# Patient Record
Sex: Female | Born: 1955
Health system: Southern US, Community
[De-identification: ages and names within clinical notes are randomized; demographics above are authoritative.]

## PROBLEM LIST (undated history)

## (undated) DIAGNOSIS — Z720 Tobacco use: Secondary | ICD-10-CM

## (undated) DIAGNOSIS — E119 Type 2 diabetes mellitus without complications: Secondary | ICD-10-CM

## (undated) DIAGNOSIS — B1910 Unspecified viral hepatitis B without hepatic coma: Secondary | ICD-10-CM

## (undated) DIAGNOSIS — Z87442 Personal history of urinary calculi: Secondary | ICD-10-CM

## (undated) DIAGNOSIS — M1990 Unspecified osteoarthritis, unspecified site: Secondary | ICD-10-CM

## (undated) DIAGNOSIS — I739 Peripheral vascular disease, unspecified: Secondary | ICD-10-CM

## (undated) DIAGNOSIS — Z8673 Personal history of transient ischemic attack (TIA), and cerebral infarction without residual deficits: Secondary | ICD-10-CM

## (undated) DIAGNOSIS — F329 Major depressive disorder, single episode, unspecified: Secondary | ICD-10-CM

## (undated) DIAGNOSIS — F419 Anxiety disorder, unspecified: Secondary | ICD-10-CM

## (undated) DIAGNOSIS — R079 Chest pain, unspecified: Secondary | ICD-10-CM

## (undated) DIAGNOSIS — E785 Hyperlipidemia, unspecified: Secondary | ICD-10-CM

## (undated) DIAGNOSIS — N200 Calculus of kidney: Secondary | ICD-10-CM

## (undated) DIAGNOSIS — H409 Unspecified glaucoma: Secondary | ICD-10-CM

## (undated) DIAGNOSIS — I2510 Atherosclerotic heart disease of native coronary artery without angina pectoris: Secondary | ICD-10-CM

## (undated) DIAGNOSIS — I10 Essential (primary) hypertension: Secondary | ICD-10-CM

## (undated) DIAGNOSIS — I639 Cerebral infarction, unspecified: Secondary | ICD-10-CM

## (undated) DIAGNOSIS — Z9189 Other specified personal risk factors, not elsewhere classified: Secondary | ICD-10-CM

## (undated) DIAGNOSIS — H547 Unspecified visual loss: Secondary | ICD-10-CM

## (undated) DIAGNOSIS — F32A Depression, unspecified: Secondary | ICD-10-CM

## (undated) HISTORY — DX: Tobacco use: Z72.0

## (undated) HISTORY — PX: ABDOMINAL HYSTERECTOMY: SHX81

## (undated) HISTORY — DX: Cerebral infarction, unspecified: I63.9

## (undated) HISTORY — DX: Depression, unspecified: F32.A

## (undated) HISTORY — DX: Unspecified viral hepatitis B without hepatic coma: B19.10

## (undated) HISTORY — DX: Hyperlipidemia, unspecified: E78.5

## (undated) HISTORY — PX: OTHER SURGICAL HISTORY: SHX169

## (undated) HISTORY — DX: Major depressive disorder, single episode, unspecified: F32.9

## (undated) HISTORY — DX: Type 2 diabetes mellitus without complications: E11.9

## (undated) HISTORY — DX: Anxiety disorder, unspecified: F41.9

## (undated) HISTORY — DX: Unspecified visual loss: H54.7

## (undated) HISTORY — DX: Essential (primary) hypertension: I10

---

## 2000-06-23 ENCOUNTER — Emergency Department (HOSPITAL_COMMUNITY): Admission: EM | Admit: 2000-06-23 | Discharge: 2000-06-23 | Payer: Self-pay | Admitting: Emergency Medicine

## 2000-06-23 ENCOUNTER — Emergency Department (HOSPITAL_COMMUNITY): Admission: EM | Admit: 2000-06-23 | Discharge: 2000-06-24 | Payer: Self-pay | Admitting: Internal Medicine

## 2000-06-23 ENCOUNTER — Encounter: Payer: Self-pay | Admitting: Internal Medicine

## 2001-04-16 ENCOUNTER — Emergency Department (HOSPITAL_COMMUNITY): Admission: EM | Admit: 2001-04-16 | Discharge: 2001-04-16 | Payer: Self-pay | Admitting: Emergency Medicine

## 2003-03-03 ENCOUNTER — Encounter: Payer: Self-pay | Admitting: Emergency Medicine

## 2003-03-03 ENCOUNTER — Emergency Department (HOSPITAL_COMMUNITY): Admission: EM | Admit: 2003-03-03 | Discharge: 2003-03-03 | Payer: Self-pay | Admitting: Emergency Medicine

## 2003-03-29 ENCOUNTER — Inpatient Hospital Stay (HOSPITAL_COMMUNITY): Admission: AD | Admit: 2003-03-29 | Discharge: 2003-03-29 | Payer: Self-pay | Admitting: Family Medicine

## 2004-02-10 ENCOUNTER — Ambulatory Visit (HOSPITAL_COMMUNITY): Admission: RE | Admit: 2004-02-10 | Discharge: 2004-02-10 | Payer: Self-pay | Admitting: Internal Medicine

## 2004-03-19 ENCOUNTER — Emergency Department (HOSPITAL_COMMUNITY): Admission: EM | Admit: 2004-03-19 | Discharge: 2004-03-19 | Payer: Self-pay | Admitting: *Deleted

## 2004-07-19 ENCOUNTER — Emergency Department (HOSPITAL_COMMUNITY): Admission: EM | Admit: 2004-07-19 | Discharge: 2004-07-19 | Payer: Self-pay | Admitting: Emergency Medicine

## 2004-09-04 ENCOUNTER — Ambulatory Visit: Payer: Self-pay | Admitting: Internal Medicine

## 2004-09-04 ENCOUNTER — Ambulatory Visit: Payer: Self-pay | Admitting: *Deleted

## 2004-10-05 ENCOUNTER — Emergency Department (HOSPITAL_COMMUNITY): Admission: EM | Admit: 2004-10-05 | Discharge: 2004-10-05 | Payer: Self-pay | Admitting: Emergency Medicine

## 2004-10-13 ENCOUNTER — Ambulatory Visit: Payer: Self-pay | Admitting: Family Medicine

## 2004-11-04 ENCOUNTER — Ambulatory Visit: Payer: Self-pay | Admitting: Family Medicine

## 2004-11-10 ENCOUNTER — Ambulatory Visit: Payer: Self-pay | Admitting: Family Medicine

## 2004-11-12 ENCOUNTER — Ambulatory Visit: Payer: Self-pay | Admitting: Family Medicine

## 2005-01-12 ENCOUNTER — Ambulatory Visit: Payer: Self-pay | Admitting: Internal Medicine

## 2005-02-01 ENCOUNTER — Ambulatory Visit: Payer: Self-pay | Admitting: Family Medicine

## 2005-02-03 ENCOUNTER — Ambulatory Visit: Payer: Self-pay | Admitting: Family Medicine

## 2005-03-10 ENCOUNTER — Ambulatory Visit: Payer: Self-pay | Admitting: Internal Medicine

## 2005-04-15 ENCOUNTER — Ambulatory Visit: Payer: Self-pay | Admitting: Internal Medicine

## 2005-04-30 ENCOUNTER — Ambulatory Visit: Payer: Self-pay | Admitting: Family Medicine

## 2005-05-15 ENCOUNTER — Emergency Department (HOSPITAL_COMMUNITY): Admission: EM | Admit: 2005-05-15 | Discharge: 2005-05-15 | Payer: Self-pay | Admitting: Emergency Medicine

## 2005-05-21 ENCOUNTER — Ambulatory Visit: Payer: Self-pay | Admitting: Family Medicine

## 2005-05-25 ENCOUNTER — Ambulatory Visit: Payer: Self-pay | Admitting: Family Medicine

## 2005-05-31 ENCOUNTER — Ambulatory Visit: Payer: Self-pay | Admitting: Family Medicine

## 2005-06-08 ENCOUNTER — Ambulatory Visit: Payer: Self-pay | Admitting: Family Medicine

## 2005-06-25 ENCOUNTER — Ambulatory Visit: Payer: Self-pay | Admitting: Internal Medicine

## 2005-07-05 ENCOUNTER — Ambulatory Visit: Payer: Self-pay | Admitting: Family Medicine

## 2005-07-21 ENCOUNTER — Ambulatory Visit: Payer: Self-pay | Admitting: Family Medicine

## 2005-08-03 ENCOUNTER — Ambulatory Visit: Payer: Self-pay | Admitting: Internal Medicine

## 2005-08-16 ENCOUNTER — Ambulatory Visit: Payer: Self-pay | Admitting: Internal Medicine

## 2006-01-31 ENCOUNTER — Ambulatory Visit: Payer: Self-pay | Admitting: Family Medicine

## 2006-02-14 ENCOUNTER — Ambulatory Visit: Payer: Self-pay | Admitting: Family Medicine

## 2006-02-17 ENCOUNTER — Ambulatory Visit (HOSPITAL_COMMUNITY): Admission: RE | Admit: 2006-02-17 | Discharge: 2006-02-17 | Payer: Self-pay | Admitting: Family Medicine

## 2006-03-14 ENCOUNTER — Ambulatory Visit: Payer: Self-pay | Admitting: Family Medicine

## 2006-03-24 ENCOUNTER — Ambulatory Visit (HOSPITAL_COMMUNITY): Admission: RE | Admit: 2006-03-24 | Discharge: 2006-03-24 | Payer: Self-pay | Admitting: Family Medicine

## 2006-05-11 ENCOUNTER — Ambulatory Visit: Payer: Self-pay | Admitting: Family Medicine

## 2006-07-06 ENCOUNTER — Ambulatory Visit: Payer: Self-pay | Admitting: Family Medicine

## 2006-07-29 ENCOUNTER — Ambulatory Visit: Payer: Self-pay | Admitting: Family Medicine

## 2006-08-18 ENCOUNTER — Ambulatory Visit: Payer: Self-pay | Admitting: Family Medicine

## 2007-02-03 ENCOUNTER — Ambulatory Visit: Payer: Self-pay | Admitting: Family Medicine

## 2007-04-21 ENCOUNTER — Emergency Department (HOSPITAL_COMMUNITY): Admission: EM | Admit: 2007-04-21 | Discharge: 2007-04-21 | Payer: Self-pay | Admitting: Family Medicine

## 2007-04-26 ENCOUNTER — Ambulatory Visit: Payer: Self-pay | Admitting: Family Medicine

## 2007-06-12 DIAGNOSIS — IMO0002 Reserved for concepts with insufficient information to code with codable children: Secondary | ICD-10-CM | POA: Insufficient documentation

## 2007-06-12 DIAGNOSIS — I10 Essential (primary) hypertension: Secondary | ICD-10-CM | POA: Insufficient documentation

## 2007-06-12 DIAGNOSIS — E1165 Type 2 diabetes mellitus with hyperglycemia: Secondary | ICD-10-CM | POA: Insufficient documentation

## 2007-06-12 DIAGNOSIS — F411 Generalized anxiety disorder: Secondary | ICD-10-CM | POA: Insufficient documentation

## 2007-06-12 DIAGNOSIS — H5440 Blindness, one eye, unspecified eye: Secondary | ICD-10-CM | POA: Insufficient documentation

## 2007-06-12 DIAGNOSIS — F329 Major depressive disorder, single episode, unspecified: Secondary | ICD-10-CM

## 2007-06-12 DIAGNOSIS — E785 Hyperlipidemia, unspecified: Secondary | ICD-10-CM

## 2007-06-12 DIAGNOSIS — F17200 Nicotine dependence, unspecified, uncomplicated: Secondary | ICD-10-CM | POA: Insufficient documentation

## 2007-06-12 DIAGNOSIS — B1910 Unspecified viral hepatitis B without hepatic coma: Secondary | ICD-10-CM | POA: Insufficient documentation

## 2007-06-12 DIAGNOSIS — F32A Depression, unspecified: Secondary | ICD-10-CM | POA: Insufficient documentation

## 2007-06-12 DIAGNOSIS — E1169 Type 2 diabetes mellitus with other specified complication: Secondary | ICD-10-CM | POA: Insufficient documentation

## 2007-06-12 HISTORY — DX: Reserved for concepts with insufficient information to code with codable children: IMO0002

## 2007-06-12 HISTORY — DX: Type 2 diabetes mellitus with hyperglycemia: E11.65

## 2007-06-12 HISTORY — DX: Blindness, one eye, unspecified eye: H54.40

## 2007-06-28 ENCOUNTER — Emergency Department (HOSPITAL_COMMUNITY): Admission: EM | Admit: 2007-06-28 | Discharge: 2007-06-28 | Payer: Self-pay | Admitting: Family Medicine

## 2007-06-28 ENCOUNTER — Ambulatory Visit: Payer: Self-pay | Admitting: Internal Medicine

## 2007-09-13 ENCOUNTER — Encounter (INDEPENDENT_AMBULATORY_CARE_PROVIDER_SITE_OTHER): Payer: Self-pay | Admitting: *Deleted

## 2007-09-22 ENCOUNTER — Ambulatory Visit: Payer: Self-pay | Admitting: Internal Medicine

## 2007-10-06 ENCOUNTER — Ambulatory Visit: Payer: Self-pay | Admitting: Internal Medicine

## 2008-01-15 ENCOUNTER — Ambulatory Visit: Payer: Self-pay | Admitting: Internal Medicine

## 2008-01-15 LAB — CONVERTED CEMR LAB
ALT: 39 units/L — ABNORMAL HIGH (ref 0–35)
AST: 33 units/L (ref 0–37)
Albumin: 3.8 g/dL (ref 3.5–5.2)
Alkaline Phosphatase: 79 units/L (ref 39–117)
BUN: 11 mg/dL (ref 6–23)
CO2: 26 meq/L (ref 19–32)
Calcium: 9.6 mg/dL (ref 8.4–10.5)
Chloride: 106 meq/L (ref 96–112)
Cholesterol: 134 mg/dL (ref 0–200)
Creatinine, Ser: 0.86 mg/dL (ref 0.40–1.20)
Glucose, Bld: 81 mg/dL (ref 70–99)
HDL: 65 mg/dL (ref >39)
Hgb A1c MFr Bld: 6.8 % — ABNORMAL HIGH (ref 4.6–6.1)
LDL Cholesterol: 50 mg/dL (ref 0–99)
Potassium: 4.2 meq/L (ref 3.5–5.3)
Sodium: 143 meq/L (ref 135–145)
Total Bilirubin: 0.3 mg/dL (ref 0.3–1.2)
Total CHOL/HDL Ratio: 2.1
Total Protein: 6.3 g/dL (ref 6.0–8.3)
Triglycerides: 95 mg/dL (ref <150)
VLDL: 19 mg/dL (ref 0–40)

## 2008-01-25 ENCOUNTER — Ambulatory Visit (HOSPITAL_COMMUNITY): Admission: RE | Admit: 2008-01-25 | Discharge: 2008-01-25 | Payer: Self-pay | Admitting: Family Medicine

## 2008-02-22 ENCOUNTER — Ambulatory Visit: Payer: Self-pay | Admitting: Internal Medicine

## 2008-03-07 ENCOUNTER — Ambulatory Visit: Payer: Self-pay | Admitting: Internal Medicine

## 2008-03-25 ENCOUNTER — Ambulatory Visit: Payer: Self-pay | Admitting: Internal Medicine

## 2008-03-25 LAB — CONVERTED CEMR LAB
ALT: 27 units/L (ref 0–35)
AST: 21 units/L (ref 0–37)
Albumin: 4.3 g/dL (ref 3.5–5.2)
Alkaline Phosphatase: 85 units/L (ref 39–117)
Amphetamine Screen, Ur: NEGATIVE
BUN: 17 mg/dL (ref 6–23)
Barbiturate Quant, Ur: NEGATIVE
Basophils Absolute: 0 10*3/uL (ref 0.0–0.1)
Basophils Relative: 0 % (ref 0–1)
Benzodiazepines.: NEGATIVE
CO2: 23 meq/L (ref 19–32)
Calcium: 10.8 mg/dL — ABNORMAL HIGH (ref 8.4–10.5)
Chloride: 104 meq/L (ref 96–112)
Cocaine Metabolites: NEGATIVE
Creatinine, Ser: 1.04 mg/dL (ref 0.40–1.20)
Creatinine,U: 142.4 mg/dL
Eosinophils Absolute: 0.3 10*3/uL (ref 0.0–0.7)
Eosinophils Relative: 3 % (ref 0–5)
Glucose, Bld: 163 mg/dL — ABNORMAL HIGH (ref 70–99)
HCT: 41.6 % (ref 36.0–46.0)
HCV Ab: NEGATIVE
Hemoglobin: 13.3 g/dL (ref 12.0–15.0)
Hep A Total Ab: POSITIVE — AB
Hep B Core Total Ab: NEGATIVE
Hep B E Ab: NEGATIVE
Hep B S Ab: POSITIVE — AB
Lymphocytes Relative: 53 % — ABNORMAL HIGH (ref 12–46)
Lymphs Abs: 4.2 10*3/uL — ABNORMAL HIGH (ref 0.7–4.0)
MCHC: 32 g/dL (ref 30.0–36.0)
MCV: 72 fL — ABNORMAL LOW (ref 78.0–100.0)
Marijuana Metabolite: NEGATIVE
Methadone: NEGATIVE
Monocytes Absolute: 0.5 10*3/uL (ref 0.1–1.0)
Monocytes Relative: 6 % (ref 3–12)
Neutro Abs: 3 10*3/uL (ref 1.7–7.7)
Neutrophils Relative %: 37 % — ABNORMAL LOW (ref 43–77)
Opiate Screen, Urine: NEGATIVE
Phencyclidine (PCP): NEGATIVE
Platelets: 195 10*3/uL (ref 150–400)
Potassium: 4.1 meq/L (ref 3.5–5.3)
Propoxyphene: NEGATIVE
RBC: 5.78 M/uL — ABNORMAL HIGH (ref 3.87–5.11)
RDW: 14.6 % (ref 11.5–15.5)
Sodium: 141 meq/L (ref 135–145)
TSH: 0.582 microintl units/mL (ref 0.350–5.50)
Total Bilirubin: 0.5 mg/dL (ref 0.3–1.2)
Total Protein: 7.1 g/dL (ref 6.0–8.3)
WBC: 7.9 10*3/uL (ref 4.0–10.5)

## 2008-04-06 ENCOUNTER — Emergency Department (HOSPITAL_COMMUNITY): Admission: EM | Admit: 2008-04-06 | Discharge: 2008-04-06 | Payer: Self-pay | Admitting: Emergency Medicine

## 2008-04-24 ENCOUNTER — Ambulatory Visit: Payer: Self-pay | Admitting: Family Medicine

## 2008-05-06 ENCOUNTER — Ambulatory Visit: Payer: Self-pay | Admitting: Internal Medicine

## 2008-05-06 LAB — CONVERTED CEMR LAB
BUN: 10 mg/dL (ref 6–23)
Basophils Absolute: 0 10*3/uL (ref 0.0–0.1)
Basophils Relative: 0.5 % (ref 0.0–1.0)
CO2: 32 meq/L (ref 19–32)
Calcium: 10.5 mg/dL (ref 8.4–10.5)
Chloride: 107 meq/L (ref 96–112)
Creatinine, Ser: 0.8 mg/dL (ref 0.4–1.2)
Eosinophils Absolute: 0.1 10*3/uL (ref 0.0–0.7)
Eosinophils Relative: 1.8 % (ref 0.0–5.0)
GFR calc Af Amer: 96 mL/min
GFR calc non Af Amer: 80 mL/min
Glucose, Bld: 153 mg/dL — ABNORMAL HIGH (ref 70–99)
HCT: 39.6 % (ref 36.0–46.0)
Hemoglobin: 12.7 g/dL (ref 12.0–15.0)
Hgb A1c MFr Bld: 10.8 % — ABNORMAL HIGH (ref 4.6–6.0)
Lymphocytes Relative: 46.7 % — ABNORMAL HIGH (ref 12.0–46.0)
MCHC: 32 g/dL (ref 30.0–36.0)
MCV: 72.5 fL — ABNORMAL LOW (ref 78.0–100.0)
Monocytes Absolute: 0.6 10*3/uL (ref 0.1–1.0)
Monocytes Relative: 7.8 % (ref 3.0–12.0)
Neutro Abs: 3.2 10*3/uL (ref 1.4–7.7)
Neutrophils Relative %: 43.2 % (ref 43.0–77.0)
Platelets: 205 10*3/uL (ref 150–400)
Potassium: 4.2 meq/L (ref 3.5–5.1)
RBC: 5.46 M/uL — ABNORMAL HIGH (ref 3.87–5.11)
RDW: 13 % (ref 11.5–14.6)
Sodium: 141 meq/L (ref 135–145)
WBC: 7.3 10*3/uL (ref 4.5–10.5)

## 2008-05-09 ENCOUNTER — Ambulatory Visit: Payer: Self-pay | Admitting: Internal Medicine

## 2008-05-15 ENCOUNTER — Encounter: Payer: Self-pay | Admitting: Internal Medicine

## 2008-05-15 ENCOUNTER — Ambulatory Visit: Payer: Self-pay

## 2008-05-29 ENCOUNTER — Ambulatory Visit: Payer: Self-pay | Admitting: Internal Medicine

## 2008-05-31 ENCOUNTER — Ambulatory Visit: Payer: Self-pay | Admitting: Family Medicine

## 2008-06-03 ENCOUNTER — Ambulatory Visit: Payer: Self-pay | Admitting: Internal Medicine

## 2008-06-19 ENCOUNTER — Encounter (INDEPENDENT_AMBULATORY_CARE_PROVIDER_SITE_OTHER): Payer: Self-pay | Admitting: Family Medicine

## 2008-06-19 ENCOUNTER — Ambulatory Visit: Payer: Self-pay | Admitting: Internal Medicine

## 2008-06-19 LAB — CONVERTED CEMR LAB: Microalb, Ur: 0.96 mg/dL (ref 0.00–1.89)

## 2008-07-04 ENCOUNTER — Ambulatory Visit: Payer: Self-pay | Admitting: Internal Medicine

## 2008-07-15 ENCOUNTER — Ambulatory Visit: Payer: Self-pay | Admitting: Internal Medicine

## 2008-07-16 ENCOUNTER — Ambulatory Visit: Payer: Self-pay | Admitting: Internal Medicine

## 2008-07-27 ENCOUNTER — Emergency Department (HOSPITAL_COMMUNITY): Admission: EM | Admit: 2008-07-27 | Discharge: 2008-07-27 | Payer: Self-pay | Admitting: Emergency Medicine

## 2008-07-29 ENCOUNTER — Ambulatory Visit: Payer: Self-pay | Admitting: Internal Medicine

## 2008-08-14 ENCOUNTER — Ambulatory Visit: Payer: Self-pay | Admitting: Internal Medicine

## 2008-08-26 ENCOUNTER — Ambulatory Visit: Payer: Self-pay | Admitting: Internal Medicine

## 2008-10-07 ENCOUNTER — Ambulatory Visit: Payer: Self-pay | Admitting: Internal Medicine

## 2008-11-08 ENCOUNTER — Ambulatory Visit: Payer: Self-pay | Admitting: Family Medicine

## 2009-01-13 ENCOUNTER — Ambulatory Visit: Payer: Self-pay | Admitting: Internal Medicine

## 2009-01-15 ENCOUNTER — Ambulatory Visit: Payer: Self-pay | Admitting: Internal Medicine

## 2009-06-01 ENCOUNTER — Emergency Department (HOSPITAL_COMMUNITY): Admission: EM | Admit: 2009-06-01 | Discharge: 2009-06-01 | Payer: Self-pay | Admitting: Family Medicine

## 2009-08-19 ENCOUNTER — Ambulatory Visit: Payer: Self-pay | Admitting: *Deleted

## 2009-10-20 ENCOUNTER — Ambulatory Visit: Payer: Self-pay | Admitting: Internal Medicine

## 2009-11-13 ENCOUNTER — Ambulatory Visit: Payer: Self-pay | Admitting: Internal Medicine

## 2009-12-02 ENCOUNTER — Ambulatory Visit: Payer: Self-pay | Admitting: Internal Medicine

## 2009-12-04 ENCOUNTER — Ambulatory Visit: Payer: Self-pay | Admitting: Internal Medicine

## 2010-01-15 ENCOUNTER — Encounter (INDEPENDENT_AMBULATORY_CARE_PROVIDER_SITE_OTHER): Payer: Self-pay | Admitting: Adult Health

## 2010-01-15 ENCOUNTER — Ambulatory Visit: Payer: Self-pay | Admitting: Family Medicine

## 2010-01-15 LAB — CONVERTED CEMR LAB: Microalb, Ur: 1.52 mg/dL (ref 0.00–1.89)

## 2010-02-24 ENCOUNTER — Encounter (INDEPENDENT_AMBULATORY_CARE_PROVIDER_SITE_OTHER): Payer: Self-pay | Admitting: Adult Health

## 2010-02-24 ENCOUNTER — Ambulatory Visit: Payer: Self-pay | Admitting: Internal Medicine

## 2010-02-24 LAB — CONVERTED CEMR LAB
ALT: 108 units/L — ABNORMAL HIGH (ref 0–35)
AST: 126 units/L — ABNORMAL HIGH (ref 0–37)
Albumin: 4.3 g/dL (ref 3.5–5.2)
Alkaline Phosphatase: 103 units/L (ref 39–117)
BUN: 6 mg/dL (ref 6–23)
CO2: 26 meq/L (ref 19–32)
Calcium: 10.1 mg/dL (ref 8.4–10.5)
Chlamydia, Swab/Urine, PCR: NEGATIVE
Chloride: 104 meq/L (ref 96–112)
Cholesterol: 196 mg/dL (ref 0–200)
Creatinine, Ser: 0.7 mg/dL (ref 0.40–1.20)
GC Probe Amp, Urine: NEGATIVE
Glucose, Bld: 161 mg/dL — ABNORMAL HIGH (ref 70–99)
HDL: 58 mg/dL (ref >39)
LDL Cholesterol: 112 mg/dL — ABNORMAL HIGH (ref 0–99)
Pap Smear: NEGATIVE
Potassium: 3.9 meq/L (ref 3.5–5.3)
Sodium: 141 meq/L (ref 135–145)
Total Bilirubin: 0.6 mg/dL (ref 0.3–1.2)
Total CHOL/HDL Ratio: 3.4
Total Protein: 6.9 g/dL (ref 6.0–8.3)
Triglycerides: 131 mg/dL (ref <150)
VLDL: 26 mg/dL (ref 0–40)

## 2010-02-26 ENCOUNTER — Encounter (INDEPENDENT_AMBULATORY_CARE_PROVIDER_SITE_OTHER): Payer: Self-pay | Admitting: Adult Health

## 2010-02-26 LAB — CONVERTED CEMR LAB
HCV Ab: NEGATIVE
Hep A IgM: NEGATIVE
Hep B C IgM: NEGATIVE
Hepatitis B Surface Ag: NEGATIVE

## 2010-06-08 ENCOUNTER — Encounter (INDEPENDENT_AMBULATORY_CARE_PROVIDER_SITE_OTHER): Payer: Self-pay | Admitting: Adult Health

## 2010-06-08 ENCOUNTER — Ambulatory Visit: Payer: Self-pay | Admitting: Internal Medicine

## 2010-06-08 LAB — CONVERTED CEMR LAB
ALT: 32 units/L (ref 0–35)
AST: 29 units/L (ref 0–37)
Albumin: 4.1 g/dL (ref 3.5–5.2)
Alkaline Phosphatase: 73 units/L (ref 39–117)
Amphetamine Screen, Ur: NEGATIVE
BUN: 15 mg/dL (ref 6–23)
Barbiturate Quant, Ur: NEGATIVE
Benzodiazepines.: NEGATIVE
CO2: 25 meq/L (ref 19–32)
Calcium: 10 mg/dL (ref 8.4–10.5)
Chloride: 107 meq/L (ref 96–112)
Cholesterol: 184 mg/dL (ref 0–200)
Cocaine Metabolites: NEGATIVE
Creatinine, Ser: 0.81 mg/dL (ref 0.40–1.20)
Creatinine,U: 90.8 mg/dL
Glucose, Bld: 166 mg/dL — ABNORMAL HIGH (ref 70–99)
HDL: 73 mg/dL (ref >39)
LDL Cholesterol: 93 mg/dL (ref 0–99)
Marijuana Metabolite: NEGATIVE
Methadone: NEGATIVE
Opiate Screen, Urine: NEGATIVE
Phencyclidine (PCP): NEGATIVE
Potassium: 4.4 meq/L (ref 3.5–5.3)
Propoxyphene: NEGATIVE
Sodium: 142 meq/L (ref 135–145)
Total Bilirubin: 0.5 mg/dL (ref 0.3–1.2)
Total CHOL/HDL Ratio: 2.5
Total Protein: 6.7 g/dL (ref 6.0–8.3)
Triglycerides: 90 mg/dL (ref <150)
VLDL: 18 mg/dL (ref 0–40)
Vit D, 25-Hydroxy: 17 ng/mL — ABNORMAL LOW (ref 30–89)

## 2010-06-30 ENCOUNTER — Ambulatory Visit (HOSPITAL_COMMUNITY): Admission: RE | Admit: 2010-06-30 | Discharge: 2010-06-30 | Payer: Self-pay | Admitting: Internal Medicine

## 2010-07-30 ENCOUNTER — Ambulatory Visit: Payer: Self-pay | Admitting: Internal Medicine

## 2010-08-18 ENCOUNTER — Ambulatory Visit: Payer: Self-pay | Admitting: Internal Medicine

## 2010-08-18 LAB — CONVERTED CEMR LAB
ALT: 21 units/L (ref 0–35)
AST: 27 units/L (ref 0–37)
Albumin: 4.4 g/dL (ref 3.5–5.2)
Alkaline Phosphatase: 78 units/L (ref 39–117)
BUN: 18 mg/dL (ref 6–23)
CO2: 25 meq/L (ref 19–32)
Calcium: 10.4 mg/dL (ref 8.4–10.5)
Chloride: 103 meq/L (ref 96–112)
Cholesterol: 219 mg/dL — ABNORMAL HIGH (ref 0–200)
Creatinine, Ser: 0.93 mg/dL (ref 0.40–1.20)
Glucose, Bld: 124 mg/dL — ABNORMAL HIGH (ref 70–99)
HDL: 63 mg/dL (ref >39)
LDL Cholesterol: 134 mg/dL — ABNORMAL HIGH (ref 0–99)
Potassium: 4.2 meq/L (ref 3.5–5.3)
Sodium: 140 meq/L (ref 135–145)
Total Bilirubin: 0.4 mg/dL (ref 0.3–1.2)
Total CHOL/HDL Ratio: 3.5
Total Protein: 6.9 g/dL (ref 6.0–8.3)
Triglycerides: 108 mg/dL (ref <150)
VLDL: 22 mg/dL (ref 0–40)

## 2010-10-15 ENCOUNTER — Encounter
Admission: RE | Admit: 2010-10-15 | Discharge: 2010-11-30 | Payer: Self-pay | Source: Home / Self Care | Admitting: Internal Medicine

## 2010-11-06 ENCOUNTER — Ambulatory Visit (HOSPITAL_COMMUNITY): Admission: RE | Admit: 2010-11-06 | Discharge: 2010-11-06 | Payer: Self-pay | Admitting: Family Medicine

## 2011-01-17 ENCOUNTER — Encounter: Payer: Self-pay | Admitting: Internal Medicine

## 2011-04-12 ENCOUNTER — Emergency Department (HOSPITAL_COMMUNITY)
Admission: EM | Admit: 2011-04-12 | Discharge: 2011-04-12 | Disposition: A | Discharge status: Home / Self Care | Payer: Self-pay | Attending: Emergency Medicine | Admitting: Emergency Medicine

## 2011-04-12 DIAGNOSIS — R21 Rash and other nonspecific skin eruption: Secondary | ICD-10-CM | POA: Insufficient documentation

## 2011-04-12 DIAGNOSIS — E119 Type 2 diabetes mellitus without complications: Secondary | ICD-10-CM | POA: Insufficient documentation

## 2011-04-12 DIAGNOSIS — I10 Essential (primary) hypertension: Secondary | ICD-10-CM | POA: Insufficient documentation

## 2011-04-12 DIAGNOSIS — IMO0001 Reserved for inherently not codable concepts without codable children: Secondary | ICD-10-CM | POA: Insufficient documentation

## 2011-05-11 NOTE — Assessment & Plan Note (Signed)
[facility]                            CARDIOLOGY OFFICE NOTE   NAME:Holmes, Susan L                      MRN:          [id number]  DATE:05/06/2008                            DOB:          1956/08/03    IDENTIFICATION:  [name] is a 55 year old woman who was referred for  evaluation of chest pressure and shortness of breath.   HISTORY OF PRESENT ILLNESS:  The patient has no known history of  coronary artery disease.  She was seen in the emergency room recently  for the above complaints.  She says over the past month or so she has  had episodes of chest pressure that occur with and without activity.  No  alleviating factors.  They last about 5-10 minutes and then go away.  She does note more shortness of breath with exertion over the last month  or so.  Denies PND.  No reflux.   ALLERGIES:  None.   MEDICATIONS:  1. Vytorin 10/20.  2. Toprol-XL 100.  3. Neurontin 300.  4. Lantus as directed.  5. Glucotrol XL 10.  6. Dulcolax.  7. Avalide 300/12.5.  8. Aspirin 81.  9. Allegra.  10.Tramadol p.r.n.  11.Proventil p.r.n.  12.Hand cream p.r.n.   PAST MEDICAL HISTORY:  1. Diabetes diagnosed 5 years ago.  Glucose recently in the 500s; now      down to 200s.  2. Hypertension.  3. Dyslipidemia.   SOCIAL HISTORY:  The patient is currently without a job.  She smokes  about 6-7 cigarettes per day.  Does not drink.   FAMILY HISTORY:  Mother who died and had a heart attack and hypertension  at age 55.  Father died of a heart attack at age 53.  One sister who is  alive has had two aneurysms.  One brother deceased of an intracranial  aneurysm rupture.  Two brothers alive, one with a pacemaker and  hypertension.   REVIEW OF SYSTEMS:  Shortness of breath as noted.  The patient is having  increased blurriness of vision in both eyes.  This is over the past  couple of weeks.  Denies dysuria.  Otherwise all systems reviewed and  negative to the above  problem except as noted.   PHYSICAL EXAMINATION:  GENERAL:  The patient is in no distress.  Blood  pressure 129/86, pulse of 66 and regular, weight 197.  HEENT:  Normocephalic, atraumatic.  Extraocular movements intact.  Pupils are equal, round and reactive to light.  Mucous membranes are  moist.  Positive arcus senilis.  NECK:  JVP is normal.  No thyromegaly.  No bruits.  LUNGS:  Clear to auscultation without rales or wheezes.  CARDIAC:  Regular rate and rhythm, S1-S2.  No S3, no S4, no murmurs.  ABDOMEN:  Supple, nontender.  No masses.  Normal bowel sounds.  No  hepatomegaly.  EXTREMITIES:  Good distal pulses throughout.  No lower extremity edema.  Feet warm.   ELECTROCARDIOGRAM:  A 12-lead EKG done on April 06, 2008 shows normal  sinus bradycardia at 53 beats per minute.   IMPRESSION:  1.  Susan Holmes is a woman who was referred for evaluation of chest      pressure and shortness of breath.  She has multiple risk factors      for coronary disease.  I would recommend getting a stress Myoview      and also an echocardiogram to evaluate.  I will be in touch with      the patient regarding test results.  2. Dyslipidemia.  Continue on statin.  Note lipid panel done at      [facility] in January showed excellent control with an LDL of 50,      HDL of 65.  3. Diabetes.  As noted above, continue follow up at [facility].  4. Hypertension, adequate control.  5. Vision changes may be related to increased blood sugars.  She      denies any urinary tract symptoms.  I told her recontact her      ophthalmologist.   Follow up will be based on the patient's test results.     Pricilla Riffle, MD, [facility]  Electronically Signed    PVR/MedQ  DD: 05/06/2008  DT: 05/06/2008  Job #: [id number]

## 2011-05-11 NOTE — Consult Note (Signed)
NAME:  Dulay, Daley               ACCOUNT NO.:  [id number]   MEDICAL RECORD NO.:  [id number]          PATIENT TYPE:  EMS   LOCATION:  [facility]                         FACILITY:  [facility]   PHYSICIAN:  Zenaida Deed. Mayford Knife, M.D.DATE OF BIRTH:  [birth year]   DATE OF CONSULTATION:  04/06/2008  DATE OF DISCHARGE:  04/06/2008                                 CONSULTATION   PRIMARY CARE PHYSICIAN:  [facility] in [place].   TIME SEEN:  7:00 p.m. in the emergency department.   CHIEF COMPLAINT:  Fatigue and lightheadedness.   HISTORY OF PRESENT ILLNESS:  Ms. Fischetti is a very pleasant 55 year old  female who presents to the emergency department today because she is  tired of being tired.  She has had trouble with fatigue for the past 3  months approximately.  During this time, she has also noticed that her  legs get tired, ache, and feel like they are going to give out.  This  occurs with walking distances, and she describes the distance to me  walking from the emergency room that she is in currently to the parking  lot causing this.  She does not associate the dizziness with any  particular activity, and describes her dizziness as if she might pass  out.  She has not passed out at all.  She has tried vitamins to help  with these symptoms, but without relief.  She cannot think of anything,  but exertion that seems to make her fatigue worse.   PAST MEDICAL HISTORY:  1. Type 2 diabetes with probable complications.  2. Hypertension.  3. Hyperlipidemia.  4. Depression.  5. Insomnia.  6. Tobacco abuse.  7. Seasonal allergies.  8. Status post hysterectomy reportedly after an infection, after a      trial of getting her bilateral tubal ligation reversed.   SOCIAL HISTORY:  The patient currently lives alone and has a border  collie mix dog.  She has a 17 year old daughter who lives in that area.  She is not currently working and lost her job approximately 2 months  ago.  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  She denies  alcohol or drug abuse.  She does smoke approximately half a pack of  cigarettes per day.   FAMILY HISTORY:  She has multiple family members with hypertension and  diabetes.  She has a brother with a pacemaker.  She has a sister who had  a stroke at age 36 or 55, and she does not know why.  She states that  her father may have had a heart attack, but she is unsure.   MEDICATIONS:  1. Lantus 40 units subcu nightly.  2. Trazodone 200 to 300 mg p.o. nightly.  3. Lexapro 40 mg p.o. nightly.  4. Irbesartan/HCTZ 300/25 mg p.o. daily.  5. Neurontin 300 mg p.o. t.i.d.  6. Docusate 100 mg p.o. daily.  7. Clonidine 0.2 mg p.o. b.i.d.  8. Albuterol 2 puffs q.6 h. p.r.n.  9. Toprol-XL 100 mg p.o. daily.  10.Aspirin 81 mg p.o. daily.  11.Glucotrol XL 10 mg p.o. daily.  12.Allegra 180 mg p.o. daily.  13.Amitriptyline 25 mg p.o. daily.  14.Vytorin 10/20 p.o. daily.  15.Cyclobenzaprine 10 mg q.8 h. p.r.n.   ALLERGIES:  No known drug allergies.   REVIEW OF SYSTEMS:  Positive for dry eyes, blurry vision, occasional  headache, occasional lower back pain, occasional chest pain with  exertion that lasts a couple of minutes, has no radiation, feels heavy  in character and rarely occurs.  Positive for shortness of breath,  positive for fatigue, positive for 2-pillow orthopnea, positive for  episodes of feeling hot and then feeling cold, positive for knee aches,  positive for urinary frequency, positive for an itchy cyclical rash on  her hands, positive for depressed mood and for poor sleep.  Otherwise,  is negative on greater than 10 systems.   PHYSICAL EXAMINATION:  VITAL SIGNS:  Temperature 97.2 degrees  Fahrenheit, blood pressure 107/71, pulse 58, respirations 16, O2 sat 98%  on room air.  GENERAL:  This is a pleasant African American female in no apparent  distress.  She is sitting in the bed while talking to me.  HEENT:  Normocephalic and  atraumatic.  Pupils are equal, round, and  reactive to light and extraocular movements are intact.  External ears  are without deformities.  Nares are clear without discharge.  Oropharynx  is pink and moist.  There is fair dentition and there has been lots of  dental work.  CARDIOVASCULAR:  Reveals regular rate and rhythm and 1/6 systolic  ejection murmur at the right upper sternal border.  There is no JVD or  no carotid bruits appreciated.  PULMONARY:  Clear to auscultation bilaterally with normal work of  breathing.  ABDOMEN:  Nontender, nondistended, and soft with positive bowel sounds.  There is no hepatosplenomegaly appreciated.  EXTREMITIES: Reveal no cyanosis, clubbing, or edema.  Pulses are 2+  distally in all extremities.  There are no cords palpated in the calves.  SKIN:  Rash on the palms of her hands bilaterally and occasionally on  fingertips, which has pinpoint macular dark lesions, which have a  peeling skin above them secondary to the patient scratching.  NEURO:  She is alert and oriented x3.  Cranial nerves II-XII are grossly  intact.  Her gait is normal, and she is able to do toe walk and heel  walk without difficulty.  PSYCH:  Reveals a flat affect and depressed mood.  MUSCULOSKELETAL:  Reveals a negative straight leg raise and strength 5/5  in all extremities.   LABORATORY VALUES:  I-STAT reveals a sodium 135, potassium 4.2, chloride  101, bicarb 27, BUN 15, creatinine 1.2, glucose 176, iodized calcium  1.28, hemoglobin 15, and hematocrit 44.  Point-of-care cardiac enzymes  reveal myoglobin of 44.1, CK-MB less than 1 and troponin I less than  0.05.  CBC reveals white blood cell count 9.7, hemoglobin 12.7,  hematocrit 39.5, platelet count 205 with a normal differential.  Chest x-  ray revealed stable mild cardiomegaly without failure.  There is no  acute cardiopulmonary disease.  TSH is 1.181.   ASSESSMENT AND PLAN:  Ms. Creekmore is a very pleasant 55 year old  female  with fatigue, dizziness, diabetes, hypertension, depression,  hyperlipidemia, tobacco abuse, allergic rhinitis, insomnia, and  dyshidrosis.  1. Fatigue.  Likely, this is multifactorial, related to insomnia,      polypharmacy, hypertension, and depression.  We will check her TSH      as noted above, and if elevated, we would recommend treatment.  We  will decrease her clonidine to 0.1 mg p.o. b.i.d. till April 10, 2008, and then stop this medication as she may be having some      orthostasis secondary to this.  We will hold her Avalide until then      and restart this once she is off her clonidine.  We have arranged      for outpatient followup with cardiology to evaluate her heart.  She      will be seen with our cardiology on [address], [address] with      Dr. Tenny Craw on May 06, 2008, at 12 p.m., and we have arranged for an      appointment with [facility] in [place] on Wednesday, April 24, 2008, at 11:45 a.m.  We have recommended this given her cardiac      risk factors and complaints of occasional chest pain.  We have      recommended that she has an outpatient stress test.  2. We have made no changes to her other medical problems except as      noted in the paragraph above.  3. Dyshidrosis.  We would recommend that the patient be prescribed a      high-dose steroid cream to help with the itchiness that she      experienced on her palms in a cyclical pattern.  4. This dictation was discussed with my attending, Dr. Charissa Bash,      who agreed with this patient being discharged home from the      emergency department with close followup.  Followup has been      arranged as noted above.      Ancil Boozer, MD  Electronically Signed      Zenaida Deed. Mayford Knife, M.D.  Electronically Signed    SA/MEDQ  D:  04/08/2008  T:  04/09/2008  Job:  [id number]   cc:   Pricilla Riffle, MD, [facility]  [facility]

## 2011-05-13 ENCOUNTER — Emergency Department (HOSPITAL_COMMUNITY)
Admission: EM | Admit: 2011-05-13 | Discharge: 2011-05-13 | Disposition: A | Discharge status: Home / Self Care | Payer: Self-pay | Attending: Emergency Medicine | Admitting: Emergency Medicine

## 2011-05-13 ENCOUNTER — Emergency Department (HOSPITAL_COMMUNITY): Payer: Self-pay

## 2011-05-13 DIAGNOSIS — E119 Type 2 diabetes mellitus without complications: Secondary | ICD-10-CM | POA: Insufficient documentation

## 2011-05-13 DIAGNOSIS — I10 Essential (primary) hypertension: Secondary | ICD-10-CM | POA: Insufficient documentation

## 2011-05-13 DIAGNOSIS — M542 Cervicalgia: Secondary | ICD-10-CM | POA: Insufficient documentation

## 2011-05-13 DIAGNOSIS — Z794 Long term (current) use of insulin: Secondary | ICD-10-CM | POA: Insufficient documentation

## 2011-05-13 DIAGNOSIS — Z79899 Other long term (current) drug therapy: Secondary | ICD-10-CM | POA: Insufficient documentation

## 2011-05-13 DIAGNOSIS — J029 Acute pharyngitis, unspecified: Secondary | ICD-10-CM | POA: Insufficient documentation

## 2011-05-13 LAB — POCT I-STAT, CHEM 8
BUN: 8 mg/dL (ref 6–23)
Calcium, Ion: 1.33 mmol/L — ABNORMAL HIGH (ref 1.12–1.32)
Chloride: 101 mEq/L (ref 96–112)
Creatinine, Ser: 0.8 mg/dL (ref 0.4–1.2)
Glucose, Bld: 392 mg/dL — ABNORMAL HIGH (ref 70–99)
HCT: 41 % (ref 36.0–46.0)
Hemoglobin: 13.9 g/dL (ref 12.0–15.0)
Potassium: 3.9 mEq/L (ref 3.5–5.1)
Sodium: 135 mEq/L (ref 135–145)
TCO2: 24 mmol/L (ref 0–100)

## 2011-05-13 LAB — GLUCOSE, CAPILLARY
Glucose-Capillary: 423 mg/dL — ABNORMAL HIGH (ref 70–99)
Glucose-Capillary: 276 mg/dL — ABNORMAL HIGH (ref 70–99)

## 2011-05-13 LAB — RAPID STREP SCREEN (MED CTR MEBANE ONLY): Streptococcus, Group A Screen (Direct): NEGATIVE

## 2011-05-13 MED ORDER — IOHEXOL 300 MG/ML  SOLN
100.0000 mL | Freq: Once | INTRAMUSCULAR | Status: AC | PRN
  Administered 2011-05-13: 100 mL via INTRAVENOUS

## 2011-06-03 ENCOUNTER — Ambulatory Visit
Admission: RE | Admit: 2011-06-03 | Discharge: 2011-06-03 | Disposition: A | Discharge status: Home / Self Care | Payer: Medicaid Other | Source: Ambulatory Visit | Attending: Family Medicine | Admitting: Family Medicine

## 2011-06-03 ENCOUNTER — Other Ambulatory Visit: Payer: Self-pay | Admitting: Family Medicine

## 2011-06-03 DIAGNOSIS — I10 Essential (primary) hypertension: Secondary | ICD-10-CM

## 2011-06-28 ENCOUNTER — Encounter: Payer: Medicaid Other | Admitting: Neurosurgery

## 2011-06-28 ENCOUNTER — Ambulatory Visit: Payer: Self-pay | Admitting: Physical Medicine and Rehabilitation

## 2011-07-27 ENCOUNTER — Other Ambulatory Visit: Payer: Self-pay | Admitting: Family Medicine

## 2011-07-27 DIAGNOSIS — Z1231 Encounter for screening mammogram for malignant neoplasm of breast: Secondary | ICD-10-CM

## 2011-07-29 ENCOUNTER — Encounter: Payer: Self-pay | Admitting: Gastroenterology

## 2011-08-03 ENCOUNTER — Ambulatory Visit
Admission: RE | Admit: 2011-08-03 | Discharge: 2011-08-03 | Disposition: A | Discharge status: Home / Self Care | Payer: Medicaid Other | Source: Ambulatory Visit | Attending: Family Medicine | Admitting: Family Medicine

## 2011-08-03 ENCOUNTER — Encounter: Payer: Self-pay | Admitting: Internal Medicine

## 2011-08-03 ENCOUNTER — Ambulatory Visit (INDEPENDENT_AMBULATORY_CARE_PROVIDER_SITE_OTHER): Payer: Medicaid Other | Admitting: Internal Medicine

## 2011-08-03 VITALS — BP 136/86 | HR 86 | Temp 99.2°F | Ht 67.0 in | Wt 191.4 lb

## 2011-08-03 DIAGNOSIS — F411 Generalized anxiety disorder: Secondary | ICD-10-CM

## 2011-08-03 DIAGNOSIS — Z720 Tobacco use: Secondary | ICD-10-CM

## 2011-08-03 DIAGNOSIS — F329 Major depressive disorder, single episode, unspecified: Secondary | ICD-10-CM

## 2011-08-03 DIAGNOSIS — H5440 Blindness, one eye, unspecified eye: Secondary | ICD-10-CM

## 2011-08-03 DIAGNOSIS — E785 Hyperlipidemia, unspecified: Secondary | ICD-10-CM

## 2011-08-03 DIAGNOSIS — F17200 Nicotine dependence, unspecified, uncomplicated: Secondary | ICD-10-CM

## 2011-08-03 DIAGNOSIS — E119 Type 2 diabetes mellitus without complications: Secondary | ICD-10-CM

## 2011-08-03 DIAGNOSIS — I10 Essential (primary) hypertension: Secondary | ICD-10-CM

## 2011-08-03 DIAGNOSIS — Z1231 Encounter for screening mammogram for malignant neoplasm of breast: Secondary | ICD-10-CM

## 2011-08-03 LAB — POCT GLYCOSYLATED HEMOGLOBIN (HGB A1C): Hemoglobin A1C: 8.4

## 2011-08-03 LAB — GLUCOSE, CAPILLARY: Glucose-Capillary: 227 mg/dL — ABNORMAL HIGH (ref 70–99)

## 2011-08-03 MED ORDER — METFORMIN HCL 500 MG PO TABS: 500.0000 mg | ORAL_TABLET | Freq: Two times a day (BID) | ORAL | Status: DC

## 2011-08-03 MED ORDER — HYDROCHLOROTHIAZIDE 12.5 MG PO TABS: 12.5000 mg | ORAL_TABLET | Freq: Every day | ORAL | Status: DC

## 2011-08-03 NOTE — Assessment & Plan Note (Addendum)
This is a new patient to clinic, and she does come in today with a hemoglobin A1c of 8.4. She is currently taking NovoLog 6 units before breakfast and lunch, 8 units before dinnertime. She takes 40 units of Lantus at nighttime. Per the patient's glucometer typical blood sugars are in the 150s to 200 range, however her hemoglobin A1c would suggest that they may be higher at times. Today we will start metformin 500 mg twice a day to see if this gives her better control. When she changes her Medicaid provider to our clinic we will have her see Jamison Neighbor to discuss diabetes management. We will see her back in 6 weeks to reevaluate her level of control. Her last urine microalbumin screen was negative, and that was in January of 2011. We have asked her to come in and do a micro-albumin screen. We have also asked her to have a lipid screen as her last lipid panel was almost one year ago. We do a foot exam when we see her in 6 weeks. She has recently seen an eye doctor for vision, but has not seen an eye doctor related to her diabetic eye exam. We will consider referring her once her provider is changed. She has had a pneumonia vaccine in the past. She is not indicated for another shot until she turns 64. She is not on an ACE at this time, and has had problems with cough related to ACE in the past. Due to her insurance situation she is unable to have access to an ARB at this time. Her goal blood pressure is a systolic less than 130.

## 2011-08-03 NOTE — Assessment & Plan Note (Signed)
Patient is currently taking Zoloft as well as trazodone. She does describe her depression symptoms as more related to decrease in activity, rather than feeling down. She does deny that she feels down in the dumps and does deny any current suicidal ideation or homicidal ideation. She feels that she has been stable in this state of mental health for some time now. She states that she has been trialed on multiple depression medications in the past. She is not able to name all of their names, but thinks she has been on Prozac in the past. She does have a hard time falling asleep at night, which is why she is on the trazodone. Once she falls asleep however she is able to stay asleep the entire night. We will continue this therapy for now and reevaluate at next visit whether current medication regimen is appropriate. Quite possibly usage of gabapentin is contributing to her state of decreased energy level during the day.

## 2011-08-03 NOTE — Assessment & Plan Note (Signed)
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 She was recently seen by an eye doctor to assess her vision, and her prescription dosage was changed for her eye glasses.

## 2011-08-03 NOTE — Progress Notes (Signed)
Subjective:    Patient ID: Susan Holmes, female    DOB: 1956-11-24, 55 y.o.   MRN: [id number]  HPI: This is a 55 year old woman who is a new patient to our clinic. She does have a past medical history of diabetes mellitus type 2, hyperlipidemia, congenital blindness in left eye, anxiety, depression, tobacco abuse, hypertension. She comes to [place] on the following medications: NovoLog 6 units subcutaneous prior to breakfast and lunch, NovoLog 8 units subcutaneous prior to dinner, Lantus 40 units subcutaneously at bedtime, pravastatin 20 mg, gabapentin 300 mg twice a day, sertraline 150 mg daily, aspirin 81 mg daily, Toprol-XL 50 mg once daily, trazodone 200 mg each bedtime. She did bring her glucometer in for downloading today. She is not having any acute issues today, however wishes to switch her primary care practitioner to our clinic. She does state that her blood glucoses at home are usually between 150 and 200, but lately have been in the 200s at times. She does state that she has been on oral pills in the past for her diabetes. She does think that her depression and anxiety do impact her life, but feel that they have been stable for some time. We will reassess at future visit whether a change in therapy may be indicated. 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 She did see an eye doctor in the last several months who did change her prescription. She is a half a pack a day smoker. I did ask her if she is interested in quitting at this time, and she did seem interested.she does have an appointment with a GI doctor to get a colonoscopy in September. She did have a Pap screen in 2011 that was negative. She did have a mammogram done this month that was negative. She did have a pneumonia vaccine in the past. She did get a hemoglobin A1c drawn today. She has her last lipid panel approximately one year ago. She had her last microalbumin screen in 2011.  PMH: DM  II Hyperlipidemia HTN Anxiety Depression Congenital blindness L eye Tobacco Abuse (<.5PPD smoker)  Family Medical History Mother (died at 40 of MI and had HTN) Father (died at 71 of MI) Brother (died of intracranial aneurysm rupture) Sister (hx of 2 aneuruysms)  Past Surgical History: Hysterectomy  Social History: see chart, she does live independently and work as a Runner, broadcasting/film/video. She does have medicaid. She is a .5 PPD smoker, and does drinks alcohol socially but rarely, she does deny any illicit substances.   Review of Systems  Constitutional: Negative for fever, chills, diaphoresis, activity change, appetite change and unexpected weight change.  HENT: Negative.   Eyes: Negative.        Pt does have congenital blindness in the L eye   Respiratory: Negative.  Negative for cough and shortness of breath.   Cardiovascular: Negative.  Negative for palpitations.  Gastrointestinal: Negative.  Negative for nausea, vomiting, abdominal pain, diarrhea, constipation, blood in stool and abdominal distention.  Genitourinary: Negative for dysuria, flank pain and difficulty urinating.  Musculoskeletal: Negative.   Skin: Negative.   Neurological: Negative.   Hematological: Negative.   Psychiatric/Behavioral: Positive for sleep disturbance. Negative for suicidal ideas, hallucinations, behavioral problems, self-injury, dysphoric mood, decreased concentration and agitation. The patient is hyperactive.        Pt has a hard time falling asleep and does have some anxiety present all the time.        Objective:  Physical Exam  Constitutional: She is oriented to person, place, and time. She appears well-developed and well-nourished. No distress.  HENT:  Head: Normocephalic and atraumatic.  Eyes: EOM are normal. Pupils are equal, round, and reactive to light. Right eye exhibits no discharge. Left eye exhibits no discharge.  Neck: Normal range of motion. Neck supple. No JVD present. No thyromegaly  present.  Cardiovascular: Normal rate, regular rhythm and normal heart sounds.   Pulmonary/Chest: Effort normal and breath sounds normal.  Abdominal: Soft. Bowel sounds are normal. She exhibits no distension. There is no tenderness. There is no rebound and no guarding.  Musculoskeletal: Normal range of motion.  Lymphadenopathy:    She has no cervical adenopathy.  Neurological: She is alert and oriented to person, place, and time. No cranial nerve deficit.  Skin: Skin is warm and dry. She is not diaphoretic.  Psychiatric: She has a normal mood and affect. Her behavior is normal. Judgment and thought content normal.          Assessment & Plan:  See problem oriented charting. We did advise the pt that she needs to change her medicaid provider via her case manager if we are going to be able to provide referrals and care in the future.

## 2011-08-03 NOTE — Assessment & Plan Note (Signed)
Most recent fasting lipid panel was slightly less than one year ago. We have asked her to come in for a fasting lipid panel soon. Her last LDL was calculated at 134. She is on Pravachol 20 mg daily. Continue current management at this time. Will reassess after fasting lipid panel results come in.

## 2011-08-03 NOTE — Patient Instructions (Signed)
You were seen today to meet your new doctor, Dr. Dorise Hiss. As a new patient to [place], we did discuss all of your medical problems. We are going to add metformin 500 mg to help control your diabetes. Please take one tablet at nighttime for one week then take one tablet with breakfast and one tablet at nighttime. We are also going to add hydrochlorothiazide (HCTZ) 12.5 mg. Please take this one time daily to help control your blood pressure. We will see you back in 6 weeks to see how your sugars are doing and see how your blood pressure is doing. If you feel like you need to be seen before then please don't hesitate to call our office. Our number is [phone]. We also discussed smoking cessation, and you will see Dorothe Pea to discuss resources for smoking cessation. Please remember to meet with your case manager in order to change your provider to our clinic.   Smoking Cessation This document explains the best ways for you to quit smoking and new treatments to help. It lists new medicines that can double or triple your chances of quitting and quitting for good. It also considers ways to avoid relapses and concerns you may have about quitting, including weight gain. NICOTINE: A POWERFUL ADDICTION If you have tried to quit smoking, you know how hard it can be. It is hard because nicotine is a very addictive drug. For some people, it can be as addictive as heroin or cocaine. Usually, people make 2 or 3 tries, or more, before finally being able to quit. Each time you try to quit, you can learn about what helps and what hurts. Quitting takes hard work and a lot of effort, but you can quit smoking. QUITTING SMOKING IS ONE OF THE MOST IMPORTANT THINGS YOU WILL EVER DO:  You will live longer, feel better, and live better.   The impact on your body of quitting smoking is felt almost immediately:   Within 20 minutes, blood pressure decreases. Pulse returns to its normal level.   After 8 hours, carbon monoxide  levels in the blood return to normal. Oxygen level increases.   After 24 hours, chance of heart attack starts to decrease. Breath, hair, and body stop smelling like smoke.   After 48 hours, damaged nerve endings begin to recover. Sense of taste and smell improve.   After 72 hours, the body is virtually free of nicotine. Bronchial tubes relax and breathing becomes easier.   After 2 to 12 weeks, lungs can hold more air. Exercise becomes easier and circulation improves.   Quitting will lower your chance of having a heart attack, stroke, cancer, or lung disease:   After 1 year, the risk of coronary heart disease is cut in half.   After 5 years, the risk of stroke falls to the same as a nonsmoker.   After 10 years, the risk of lung cancer is cut in half and the risk of other cancers decreases significantly.   After 15 years, the risk of coronary heart disease drops, usually to the level of a nonsmoker.   If you are pregnant, quitting smoking will improve your chances of having a healthy baby.   The people you live with, especially your children, will be healthier.   You will have extra money to spend on things other than cigarettes.  FIVE KEYS TO QUITTING Studies have shown that these 5 steps will help you quit smoking and quit for good. You have the best chances of  quitting if you use them together: 1. Get ready.  2. Get support and encouragement.  3. Learn new skills and behaviors.  4. Get medicine to reduce your nicotine addiction and use it correctly.  5. Be prepared for relapse or difficult situations. Be determined to continue trying to quit, even if you do not succeed at first.  1. GET READY  Set a quit date.   Change your environment.   Get rid of ALL cigarettes, ashtrays, matches, and lighters in your home, car, and place of work.   Do not let people smoke in your home.   Review your past attempts to quit. Think about what worked and what did not.   Once you quit, do  not smoke. NOT EVEN A PUFF!  2. GET SUPPORT AND ENCOURAGEMENT Studies have shown that you have a better chance of being successful if you have help. You can get support in many ways.  Tell your family, friends, and coworkers that you are going to quit and need their support. Ask them not to smoke around you.   Talk to your caregivers (doctor, dentist, nurse, pharmacist, psychologist, and/or smoking counselor).   Get individual, group, or telephone counseling and support. The more counseling you have, the better your chances are of quitting. Programs are available at [employer] and health centers. Call your local health department for information about programs in your area.   Spiritual beliefs and practices may help some smokers quit.   Quit meters are Photographer that keep track of quit statistics, such as amount of "quit-time," cigarettes not smoked, and money saved.   Many smokers find one or more of the many self-help books available useful in helping them quit and stay off tobacco.  3. LEARN NEW SKILLS AND BEHAVIORS  Try to distract yourself from urges to smoke. Talk to someone, go for a walk, or occupy your time with a task.   When you first try to quit, change your routine. Take a different route to work. Drink tea instead of coffee. Eat breakfast in a different place.   Do something to reduce your stress. Take a hot bath, exercise, or read a book.   Plan something enjoyable to do every day. Reward yourself for not smoking.   Explore interactive web-based programs that specialize in helping you quit.  4. GET MEDICINE AND USE IT CORRECTLY Medicines can help you stop smoking and decrease the urge to smoke. Combining medicine with the above behavioral methods and support can quadruple your chances of successfully quitting smoking. The U.S. Food and Drug Administration (FDA) has approved 7 medicines to help you quit smoking. These medicines fall  into 3 categories.  Nicotine replacement therapy (delivers nicotine to your body without the negative effects and risks of smoking):   Nicotine gum: Available over-the-counter.   Nicotine lozenges: Available over-the-counter.   Nicotine inhaler: Available by prescription.   Nicotine nasal spray: Available by prescription.   Nicotine skin patches (transdermal): Available by prescription and over-the-counter.   Antidepressant medicine (helps people abstain from smoking, but how this works is unknown):   Bupropion sustained-release (SR) tablets: Available by prescription.   Nicotinic receptor partial agonist (simulates the effect of nicotine in your brain):   Varenicline tartrate tablets: Available by prescription.   Ask your caregiver for advice about which medicines to use and how to use them. Carefully read the information on the package.   Everyone who is trying to quit may benefit from  using a medicine. If you are pregnant or trying to become pregnant, nursing an infant, you are under age 66, or you smoke fewer than 10 cigarettes per day, talk to your caregiver before taking any nicotine replacement medicines.   You should stop using a nicotine replacement product and call your caregiver if you experience nausea, dizziness, weakness, vomiting, fast or irregular heartbeat, mouth problems with the lozenge or gum, or redness or swelling of the skin around the patch that does not go away.   Do not use any other product containing nicotine while using a nicotine replacement product.   Talk to your caregiver before using these products if you have diabetes, heart disease, asthma, stomach ulcers, you had a recent heart attack, you have high blood pressure that is not controlled with medicine, a history of irregular heartbeat, or you have been prescribed medicine to help you quit smoking.  5. BE PREPARED FOR RELAPSE OR DIFFICULT SITUATIONS  Most relapses occur within the first 3 months  after quitting. Do not be discouraged if you start smoking again. Remember, most people try several times before they finally quit.   You may have symptoms of withdrawal because your body is used to nicotine. You may crave cigarettes, be irritable, feel very hungry, cough often, get headaches, or have difficulty concentrating.   The withdrawal symptoms are only temporary. They are strongest when you first quit, but they will go away within 10 to 14 days.  Here are some difficult situations to watch for:  Alcohol. Avoid drinking alcohol. Drinking lowers your chances of successfully quitting.   Caffeine. Try to reduce the amount of caffeine you consume. It also lowers your chances of successfully quitting.   Other smokers. Being around smoking can make you want to smoke. Avoid smokers.   Weight gain. Many smokers will gain weight when they quit, usually less than 10 pounds. Eat a healthy diet and stay active. Do not let weight gain distract you from your main goal, quitting smoking. Some medicines that help you quit smoking may also help delay weight gain. You can always lose the weight gained after you quit.   Bad mood or depression. There are a lot of ways to improve your mood other than smoking.  If you are having problems with any of these situations, talk to your caregiver. SPECIAL SITUATIONS OR CONDITIONS Studies suggest that everyone can quit smoking. Your situation or condition can give you a special reason to quit.  Pregnant women/New mothers: By quitting, you protect your baby's health and your own.   Hospitalized patients: By quitting, you reduce health problems and help healing.   Heart attack patients: By quitting, you reduce your risk of a second heart attack.   Lung, head, and neck cancer patients: By quitting, you reduce your chance of a second cancer.   Parents of children and adolescents: By quitting, you protect your children from illnesses caused by secondhand smoke.    QUESTIONS TO THINK ABOUT Think about the following questions before you try to stop smoking. You may want to talk about your answers with your caregiver.  Why do you want to quit?   If you tried to quit in the past, what helped and what did not?   What will be the most difficult situations for you after you quit? How will you plan to handle them?   Who can help you through the tough times? Your family? Friends? Caregiver?   What pleasures do you get from smoking?  What ways can you still get pleasure if you quit?  Here are some questions to ask your caregiver:  How can you help me to be successful at quitting?   What medicine do you think would be best for me and how should I take it?   What should I do if I need more help?   What is smoking withdrawal like? How can I get information on withdrawal?  Quitting takes hard work and a lot of effort, but you can quit smoking. FOR MORE INFORMATION Smokefree.gov (http://www.davis-sullivan.com/) provides free, accurate, evidence-based information and professional assistance to help support the immediate and long-term needs of people trying to quit smoking. Document Released: 12/07/2001 Document Re-Released: 06/02/2010 [facility] Patient Information 2011 [place], [place].Diabetes and Exercise Regular exercise is important and can help:   Control blood glucose (sugar).   Decrease blood pressure.   Control blood lipids (cholesterol and triglycerides).   Improve overall health.  BENEFITS FROM EXERCISE:  Improved fitness.   Improved flexibility.   Improved endurance.   Increased bone density.   Weight control.   Increased muscle strength.   Decreased body fat.   Improvement of the body's use of a hormone called insulin.   Increased insulin sensitivity.   Reduction of insulin needs.   Helps you feel better.   Reduces stress and tension.  People with diabetes who add exercise to their lifestyle gain additional benefits.   Weight  loss.   Reduces appetite.   Improves body's use of blood glucose (sugar).   Decreases risk factors for heart disease:   Lowering of cholesterol and triglycerides.   Raising the level of good cholesterol (high-density lipoproteins [HDL]).   Lowering blood sugar.   Decreases blood pressure.  TYPE 1 DIABETES AND EXERCISE  Exercise will usually lower your blood glucose.   If blood glucose is greater than 240 mg/dl, check urine ketones. If ketones are present, do not exercise.   Location of the insulin injection sites may need to be adjusted with exercise. Avoid injecting insulin into areas of the body that will be exercised. For example, avoid injecting insulin into:   The arms when playing tennis.   The legs when jogging. For more information, discuss this with your caregiver.   Keep a record of:   Food intake.   Type and amount of exercise.   Expected peak times of insulin action.   Blood glucose (sugar) levels.  Do this before, during and after exercise. Review your records with your caregiver(s). This will help you to develop guidelines for adjusting food intake and/or insulin amounts.  TYPE 2 DIABETES AND EXERCISE  Regular physical activity can help control blood glucose.   Exercise is important because it may:   Increase the body's sensitivity to insulin.   Improve blood glucose control.   Exercise reduces the risk of heart disease. It decreases serum cholesterol and triglycerides. It also lowers blood pressure.   Those who take insulin or oral hypoglycemic agents should watch for signs of hypoglycemia. These signs include dizziness, shaking, sweating, chills and confusion.   Body water is lost during exercise. It must be replaced. This will help to avoid loss of body fluids (dehydration) and/or heat stroke.  Be sure to talk to your caregiver before starting an exercise program to make sure it is safe for you. Remember, any activity is better than none.    Document Released: 03/04/2004 Document Re-Released: 10/10/2009 [facility] Patient Information 2011 [place], [place].

## 2011-08-03 NOTE — Assessment & Plan Note (Signed)
Per patient she does smoke slightly less than one half pack per day. She is interested in quitting at this time, and I have given her information about quitting as well as a referral to Dorothe Pea in social work. She used to smoke more, and has reduced her usage. I will reassess her willingness to quit at next visit in 6 weeks. And I will continue to encourage her to stop smoking.

## 2011-08-03 NOTE — Progress Notes (Signed)
Ms. Trew's history and physical examination were reviewed with Dr. Dorise Hiss.  We formulated the assessment and plan together.  I agree with her documentation.

## 2011-08-03 NOTE — Assessment & Plan Note (Signed)
Patient was new to the clinic today, and came in taking only metoprolol 50 mg  twice a day. In the past she has been on an ACE inhibitor, which did give her cough. She does not wish to resume ACE inhibitor at this time. Patient is currently unable to have access to an ARB due to her insurance. We will add hydrochlorothiazide 12.5 mg daily at this visit. Her systolic blood pressure goal is less than 130. As patient cannot be on an ACE inhibitor, it is very important to have her blood pressure under tight management.

## 2011-08-03 NOTE — Assessment & Plan Note (Signed)
Per patient she does have a doctor that she sees who does prescribe her Zoloft and trazodone. She also states that she is taking gabapentin 300 mg twice daily for her anxiety. I would question whether this may just be sedating her. I will reevaluate at next visit and possibly consider stopping the gabapentin seeing as how she has no other indication for its usage. She states that she does have baseline anxiety which she describes further as a state of increased activity/restlessness. She is also jittery at times. She does not have acute attacks of anxiety. Her anxiety does appear to be stable at this time.

## 2011-08-04 ENCOUNTER — Other Ambulatory Visit (INDEPENDENT_AMBULATORY_CARE_PROVIDER_SITE_OTHER): Payer: Medicaid Other

## 2011-08-04 DIAGNOSIS — E119 Type 2 diabetes mellitus without complications: Secondary | ICD-10-CM

## 2011-08-04 DIAGNOSIS — E785 Hyperlipidemia, unspecified: Secondary | ICD-10-CM

## 2011-08-04 LAB — LIPID PANEL
Cholesterol: 181 mg/dL (ref 0–200)
HDL: 41 mg/dL (ref >39)
LDL Cholesterol: 116 mg/dL — ABNORMAL HIGH (ref 0–99)
Total CHOL/HDL Ratio: 4.4 Ratio
Triglycerides: 122 mg/dL (ref <150)
VLDL: 24 mg/dL (ref 0–40)

## 2011-08-05 LAB — MICROALBUMIN / CREATININE URINE RATIO
Creatinine, Urine: 120.5 mg/dL
Microalb Creat Ratio: 4.1 mg/g (ref 0.0–30.0)
Microalb, Ur: 0.5 mg/dL (ref 0.00–1.89)

## 2011-08-06 ENCOUNTER — Other Ambulatory Visit: Payer: Self-pay | Admitting: Family Medicine

## 2011-08-06 DIAGNOSIS — R928 Other abnormal and inconclusive findings on diagnostic imaging of breast: Secondary | ICD-10-CM

## 2011-08-13 ENCOUNTER — Ambulatory Visit
Admission: RE | Admit: 2011-08-13 | Discharge: 2011-08-13 | Disposition: A | Discharge status: Home / Self Care | Payer: Medicaid Other | Source: Ambulatory Visit | Attending: Family Medicine | Admitting: Family Medicine

## 2011-08-13 DIAGNOSIS — R928 Other abnormal and inconclusive findings on diagnostic imaging of breast: Secondary | ICD-10-CM

## 2011-08-23 ENCOUNTER — Ambulatory Visit (AMBULATORY_SURGERY_CENTER): Payer: Medicaid Other | Admitting: *Deleted

## 2011-08-23 VITALS — Ht 67.0 in | Wt 189.4 lb

## 2011-08-23 DIAGNOSIS — Z1211 Encounter for screening for malignant neoplasm of colon: Secondary | ICD-10-CM

## 2011-08-23 NOTE — Progress Notes (Signed)
Suprep given to patient

## 2011-09-02 ENCOUNTER — Telehealth: Payer: Self-pay | Admitting: Internal Medicine

## 2011-09-02 NOTE — Telephone Encounter (Signed)
I tried to call both contact numbers for the pt on several occasions with no answer. Will forward a message to Dr. Tonny Branch to increase her pravachol to 40 mg at next visit when he sees her on Sept 19th. If he wants, I can fill prescription for her.

## 2011-09-06 ENCOUNTER — Other Ambulatory Visit: Payer: Self-pay | Admitting: *Deleted

## 2011-09-06 ENCOUNTER — Other Ambulatory Visit: Payer: Medicaid Other | Admitting: Gastroenterology

## 2011-09-06 MED ORDER — GLUCOSE BLOOD VI STRP: ORAL_STRIP | Status: DC

## 2011-09-15 ENCOUNTER — Ambulatory Visit (INDEPENDENT_AMBULATORY_CARE_PROVIDER_SITE_OTHER): Payer: Medicaid Other | Admitting: Internal Medicine

## 2011-09-15 ENCOUNTER — Encounter: Payer: Self-pay | Admitting: Internal Medicine

## 2011-09-15 VITALS — BP 123/86 | HR 90 | Temp 98.6°F | Wt 183.7 lb

## 2011-09-15 DIAGNOSIS — E119 Type 2 diabetes mellitus without complications: Secondary | ICD-10-CM

## 2011-09-15 DIAGNOSIS — E785 Hyperlipidemia, unspecified: Secondary | ICD-10-CM

## 2011-09-15 DIAGNOSIS — I10 Essential (primary) hypertension: Secondary | ICD-10-CM

## 2011-09-15 DIAGNOSIS — M545 Low back pain, unspecified: Secondary | ICD-10-CM

## 2011-09-15 DIAGNOSIS — F411 Generalized anxiety disorder: Secondary | ICD-10-CM

## 2011-09-15 MED ORDER — INSULIN ASPART 100 UNIT/ML ~~LOC~~ SOLN: SUBCUTANEOUS | Status: DC

## 2011-09-15 MED ORDER — INSULIN PEN NEEDLE 31G X 6 MM MISC: 1.0000 | Freq: Four times a day (QID) | Status: DC

## 2011-09-15 MED ORDER — HYDROCHLOROTHIAZIDE 12.5 MG PO TABS: 12.5000 mg | ORAL_TABLET | Freq: Every day | ORAL | Status: DC

## 2011-09-15 MED ORDER — INSULIN GLARGINE 100 UNIT/ML ~~LOC~~ SOLN: 40.0000 [IU] | Freq: Every day | SUBCUTANEOUS | Status: DC

## 2011-09-15 NOTE — Progress Notes (Signed)
Subjective:   Patient ID: MESHAWN OCONNOR female   DOB: 1956/03/26 55 y.o.   MRN: [id number]  HPI: Ms.Alonnie L Ricklefs is a 55 y.o. woman who presents today to discuss her chronic problems.  She is relatively new to our clinic and has diabetes, hypertension, anxiety, hyperlipidemia, and tobacco abuse.  She is a former [facility] patient but with their cut backs has been established in our clinic.  She has gotten her medicaid card switched over to list [place] as her PCP so we can make referrals for her.   At her last visit she was started on metformin and her meter has showed 100s with very few 200s.  She has had no hypoglycemia events but on questioning she has very little hypoglycemia awareness.  She states that she has not had any symptoms of shaking, dizziness, sweating, nausea, or fatigue.  She states that in the morning her meter reads 70s-80s.  I reviewed her blood sugar meter readings and she has good control around breakfast and lunch times but has some increased blood sugars at supper time.  When asked she states that she doesn't eat a meal until supper time and has not for sometime.  She states that usually she doesn't have time to eat breakfast or lunch.    She also states that she has had low back pain for over a year.  She states that it is a soreness that sometimes throbs and has been present for over 1 year.  She states that it is worse if she stands for a long time or if she walks to far. She also states that it is worse with bending and twisting.  She denies electric pains shooting down her legs, numbness, or weakness.  She uses tylenol, advil, and hydrocodone helps just a little.  She also states that a  heating pad and back patches help some.  She denies any trauma or injury to the area.    She also states that she has a long standing problem falling asleep at night because of her mind racing and not being able to "turn it off."   She has been followed by mental health for sometime and has  been on Trazodone for sometime.  She states that 6 months ago it stopped working and her doctors at mental health started Zoloft which she has been taking.  She states that it has not helped.  She last saw her mental health doctors 2 months ago.  Past Medical History  Diagnosis Date  . Hepatitis B infection pt unsure    resolved  . Diabetes mellitus, type II   . HTN (hypertension)   . Hyperlipidemia   . Congenital blindness     L eye  . Tobacco abuse     .5 PPD smoker  . Anxiety   . Depression    Current Outpatient Prescriptions  Medication Sig Dispense Refill  . aspirin 81 MG tablet Take 81 mg by mouth daily.        Marland Kitchen gabapentin (NEURONTIN) 300 MG capsule Take 300 mg by mouth 2 (two) times daily.        Marland Kitchen glucose blood test strip Use as instructed  100 each  12  . hydrochlorothiazide (HYDRODIURIL) 12.5 MG tablet Take 1 tablet (12.5 mg total) by mouth daily.  30 tablet  3  . insulin aspart (NOVOLOG) 100 UNIT/ML injection Inject 6 Units into the skin 3 (three) times daily before meals. Inject 6 units under the skin 30 min  before breakfast and lunch. Inject 8 units under the skin before dinner.       . insulin glargine (LANTUS) 100 UNIT/ML injection Inject 40 Units into the skin at bedtime.        . metFORMIN (GLUCOPHAGE) 500 MG tablet Take 1 tablet (500 mg total) by mouth 2 (two) times daily with a meal. Please take 1 capsule at bedtime for 1 week, then take 1 capsule twice daily, at breakfast and at bedtime.  60 tablet  11  . metoprolol (TOPROL-XL) 50 MG 24 hr tablet Take 50 mg by mouth daily.        . pravastatin (PRAVACHOL) 20 MG tablet Take 20 mg by mouth daily.        . sertraline (ZOLOFT) 100 MG tablet Take 150 mg by mouth daily.        . traZODone (DESYREL) 100 MG tablet Take 200 mg by mouth at bedtime.         Family History  Problem Relation Age of Onset  . Heart disease Mother   . Hypertension Mother   . Heart disease Father   . Aneurysm Brother   . Aneurysm Sister   .  Colon cancer Neg Hx   . Esophageal cancer Neg Hx   . Stomach cancer Neg Hx    History   Social History  . Marital Status: Divorced    Spouse Name: N/A    Number of Children: N/A  . Years of Education: N/A   Social History Main Topics  . Smoking status: Smoker, Current Status Unknown -- 0.5 packs/day    Types: Cigarettes  . Smokeless tobacco: None  . Alcohol Use: No     very rarely, socially  . Drug Use: No  . Sexually Active: None   Other Topics Concern  . None   Social History Narrative  . None   Review of Systems: Constitutional: Denies fever, chills, diaphoresis, appetite change and fatigue.  HEENT: Denies photophobia, eye pain, redness, hearing loss, ear pain, congestion, sore throat, rhinorrhea, sneezing, mouth sores, trouble swallowing, neck pain, neck stiffness and tinnitus.   Respiratory: Denies SOB, DOE, cough, chest tightness,  and wheezing.   Cardiovascular: Denies chest pain, palpitations and leg swelling.  Gastrointestinal: Denies nausea, vomiting, abdominal pain, diarrhea, constipation, blood in stool and abdominal distention.  Genitourinary: Denies dysuria, urgency, frequency, hematuria, flank pain and difficulty urinating.  Musculoskeletal: Positive for back pain. Denies myalgias,  joint swelling, arthralgias and gait problem.  Skin: Denies pallor, rash and wound.  Neurological: Denies dizziness, seizures, syncope, weakness, light-headedness, numbness and headaches.  Hematological: Denies adenopathy. Easy bruising, personal or family bleeding history  Psychiatric/Behavioral: Positive for sleep disturbance.  Denies suicidal ideation, mood changes, confusion, nervousness, and agitation  Objective:  Physical Exam: Filed Vitals:   09/15/11 1512  BP: 123/86  Pulse: 90  Temp: 98.6 F (37 C)  TempSrc: Oral  Weight: 183 lb 11.2 oz (83.326 kg)   Constitutional: Vital signs reviewed.  Patient is a well-developed and well-nourished woman in no acute distress  and cooperative with exam. Alert and oriented x3.  Head: Normocephalic and atraumatic Ear: TM normal bilaterally Mouth: no erythema or exudates, MMM Eyes: PERRL, EOMI, conjunctivae normal, No scleral icterus.  Neck: Supple, Trachea midline normal ROM, No JVD, mass, thyromegaly, or carotid bruit present.  Cardiovascular: RRR, S1 normal, S2 normal, no MRG, pulses symmetric and intact bilaterally Pulmonary/Chest: CTAB, no wheezes, rales, or rhonchi Abdominal: Soft. Non-tender, non-distended, bowel sounds are normal, no masses, organomegaly,  or guarding present.  GU: no CVA tenderness Musculoskeletal: There is lumbar paraspinous tenderness R>L.  SLR normal bilaterally.  No joint deformities, erythema, or stiffness, ROM illicits pain with bending, and twisting but not flexion, extension.   Hematology: no cervical, inginal, or axillary adenopathy.  Neurological: A&O x3, Strength is normal and symmetric bilaterally, cranial nerve II-XII are grossly intact, no focal motor deficit, sensory intact to light touch bilaterally.  Skin: Warm, dry and intact. No rash, cyanosis, or clubbing.  Psychiatric: Normal mood and affect. speech and behavior is normal. Judgment and thought content normal. Cognition and memory are normal.   Assessment & Plan:

## 2011-09-15 NOTE — Patient Instructions (Addendum)
Diabetes Meal Planning Guide The diabetes meal planning guide is a tool to help you plan your meals and snacks. It is important for people with diabetes to manage their blood sugar levels. Choosing the right foods and the right amounts throughout your day will help control your blood sugar. Eating right can even help you improve your blood pressure and reach or maintain a healthy weight. CARBOHYDRATE COUNTING MADE EASY When you eat carbohydrates, they turn to sugar (glucose). This raises your blood sugar level. Counting carbohydrates can help you control this level so you feel better. When you plan your meals by counting carbohydrates, you can have more flexibility in what you eat and balance your medicine with your food intake. Carbohydrate counting simply means adding up the total amount of carbohydrate grams (g) in your meals or snacks. Try to eat about the same amount at each meal. Foods with carbohydrates are listed below. Each portion below is 1 carbohydrate serving or 15 grams of carbohydrates. Ask your dietician how many grams of carbohydrates you should eat at each meal or snack. Grains and Starches 1 slice bread 1/2 English muffin or hotdog/hamburger bun 3/4 cup cold cereal (unsweetened) 1/3 cup cooked pasta or rice 1/2 cup starchy vegetables (corn, potatoes, peas, beans, winter squash) 1 tortilla (6 inches) 1/4 bagel 1 waffle or pancake (size of a CD) 1/2 cup cooked cereal 4 to 6 small crackers *Whole grain is recommended Fruit 1 cup fresh unsweetened berries, melon, papaya, pineapple 1 small fresh fruit 1/2 banana or mango 1/2 cup fruit juice (4 ounces unsweetened) 1/2 cup canned fruit in natural juice or water 2 tablespoons dried fruit 12 to 15 grapes or cherries Milk and Yogurt 1 cup fat-free or 1% milk 1 cup soy milk 6 ounces light yogurt with sugar-free sweetener 6 ounces low-fat soy yogurt 6 ounces plain yogurt Vegetables 1 cup raw or 1/2 cup cooked is counted as 0  carbohydrates or a "free" food. If you eat 3 or more servings at one meal, count them as 1 carbohydrate serving. Other Carbohydrates 3/4 ounces chips or pretzels 1/2 cup ice cream or frozen yogurt 1/4 cup sherbet or sorbet 2 inch square cake, no frosting 1 tablespoon honey, sugar, jam, jelly, or syrup 2 small cookies 3 squares of graham crackers 3 cups popcorn 6 crackers 1 cup broth-based soup Count 1 cup casserole or other mixed foods as 2 carbohydrate servings. Foods with less than 20 calories in a serving may be counted as 0 carbohydrates or a "free" food. You may want to purchase a book or computer software that lists the carbohydrate gram counts of different foods. In addition, the nutrition facts panel on the labels of the foods you eat are a good source of this information. The label will tell you how big the serving size is and the total number of carbohydrate grams you will be eating per serving. Divide this number by 15 to obtain the number of carbohydrate servings in a portion. Remember: 1 carbohydrate serving equals 15 grams of carbohydrate. SERVING SIZES Measuring foods and serving sizes helps you make sure you are getting the right amount of food. The list below tells how big or small some common serving sizes are.  1 ounce (oz) of cheese.................................4 stacked dice.   2 to 3 oz cooked meat..................................Deck of cards.   1 teaspoon (tsp)............................................Tip of little finger.   1 tablespoon (tbs).........................................Thumb.   2 tbs.............................................................Golf ball.    cup...........................................................Half of a fist.   1 cup............................................................A fist.    SAMPLE DIABETES MEAL PLAN Below is a sample meal plan that includes foods from the grain and starches, dairy, vegetable, fruit, and  meat groups. A dietician can individualize a meal plan to fit your calorie needs and tell you the number of servings needed from each food group. However, controlling the total amount of carbohydrates in your meal or snack is more important than making sure you include all of the food groups at every meal. You may interchange carbohydrate containing foods (dairy, starches, and fruits). The meal plan below is an example of a 2000 calorie diet using carbohydrate counting. This meal plan has 17 carbohydrate servings (carb choices). Breakfast 1 cup oatmeal (2 carb choices) 3/4 cup light yogurt (1 carb choice) 1 cup blueberries (1 carb choice) 1/4 cup almonds  Snack 1 large apple (2 carb choices) 1 low-fat string cheese stick  Lunch Chicken breast salad:  1 cup spinach   1/4 cup chopped tomatoes   2 oz chicken breast, sliced   2 tbs low-fat [place] dressing  12 whole-wheat crackers (2 carb choices) 12 to 15 grapes (1 carb choice) 1 cup low-fat milk (1 carb choice)  Snack 1 cup carrots 1/2 cup hummus (1 carb choice)  Dinner 3 oz broiled salmon 1 cup brown rice (3 carb choices)  Snack 1 1/2 cups steamed broccoli (1 carb choice) drizzled with 1 tsp olive oil and lemon juice 1 cup light pudding (2 carb choices)  DIABETES MEAL PLANNING WORKSHEET Your dietician can use this worksheet to help you decide how many servings of foods and what types of foods are right for you.  Breakfast Food Group and Servings Carb Choices Grain/Starches _______________________________________ Dairy ______________________________________________ Vegetable _______________________________________ Fruit _______________________________________________ Meat _______________________________________________ Fat _____________________________________________ Lunch Food Group and Servings Carb Choices Grain/Starches ________________________________________ Dairy _______________________________________________ Fruit  ________________________________________________ Meat ________________________________________________ Fat _____________________________________________ Dinner Food Group and Servings Carb Choices Grain/Starches ________________________________________ Dairy _______________________________________________ Fruit ________________________________________________ Meat ________________________________________________ Fat _____________________________________________ Snacks Food Group and Servings Carb Choices Grain/Starches ________________________________________ Dairy _______________________________________________ Vegetable ________________________________________ Fruit ________________________________________________ Meat ________________________________________________ Fat _____________________________________________ Daily Totals Starches _________________________ Vegetable __________________________ Fruit ______________________________ Dairy ______________________________ Meat ______________________________ Fat ________________________________  Document Released: 09/09/2005 Document Re-Released: 06/02/2010 [employer] Patient Information 2011 [place], [place].  Back Pain (Lumbosacral Strain) Back pain is one of the most common causes of pain. There are many causes of back pain. Most are not serious conditions.  CAUSES Your backbone (spinal column) is made up of 24 main vertebral bodies, the sacrum, and the coccyx. These are held together by muscles and tough, fibrous tissue (ligaments). Nerve roots pass through the openings between the vertebrae. A sudden move or injury to the back may cause injury to, or pressure on, these nerves. This may result in localized back pain or pain movement (radiation) into the buttocks, down the leg, and into the foot. Sharp, shooting pain from the buttock down the back of the leg (sciatica) is frequently associated with a ruptured (herniated) disc. Pain  may be caused by muscle spasm alone. Your caregiver can often find the cause of your pain by the details of your symptoms and an exam. In some cases, you may need tests (such as X-rays). Your caregiver will work with you to decide if any tests are needed based on your specific exam. HOME CARE INSTRUCTIONS  Avoid an underactive lifestyle. Active exercise, as directed by your caregiver, is your greatest weapon against back pain.   Avoid hard physical activities (tennis, racquetball, water-skiing) if you are not in proper physical condition for it.  This may aggravate and/or create problems.   If you have a back problem, avoid sports requiring sudden body movements. Swimming and walking are generally safer activities.   Maintain good posture.   Avoid becoming overweight (obese).   Use bed rest for only the most extreme, sudden (acute) episode. Your caregiver will help you determine how much bed rest is necessary.   For acute conditions, you may put ice on the injured area.   Put ice in a plastic bag.   Place a towel between your skin and the bag.   Leave the ice on for 15 minutes at a time, every 2 hours, or as needed.   After you are improved and more active, it may help to apply heat for 30 minutes before activities.  See your caregiver if you are having pain that lasts longer than expected. Your caregiver can advise appropriate exercises and/or therapy if needed. With conditioning, most back problems can be avoided. SEEK IMMEDIATE MEDICAL CARE IF:  You have numbness, tingling, weakness, or problems with the use of your arms or legs.   You experience severe back pain not relieved with medicines.   There is a change in bowel or bladder control.   You have increasing pain in any area of the body, including your belly (abdomen).   You notice shortness of breath, dizziness, or feel faint.   You feel sick to your stomach (nauseous), are throwing up (vomiting), or become sweaty.   You  notice discoloration of your toes or legs, or your feet get very cold.   Your back pain is getting worse.   You have an oral temperature above 101, not controlled by medicine.  MAKE SURE YOU:   Understand these instructions.   Will watch your condition.   Will get help right away if you are not doing well or get worse.  Document Released: 09/22/2005 Document Re-Released: 03/09/2010 [facility] Patient Information 2011 [place], [place].  1. We will work on getting you an appointment with Lorelee New the diabetes educator.  She will call you with your appointment date and time.    2. Continue taking all your medications as prescribed.   3. Its okay to use Aleve OTC take 2 tablets twice daily for the next 10 days to help your back.  Ice, Heat, massage, and your PT exercises are helpful as well.  4. Follow up with your mental Health doctors about your sleep and anxiety.  5.  Follow up in about 2 months to see how you are doing.

## 2011-09-16 MED ORDER — PRAVASTATIN SODIUM 40 MG PO TABS: 40.0000 mg | ORAL_TABLET | Freq: Every day | ORAL | Status: DC

## 2011-09-16 NOTE — Progress Notes (Signed)
I did this yesterday and sent through the prescription for the increased pravastatin dose.

## 2011-09-17 NOTE — Assessment & Plan Note (Signed)
BP Readings from Last 3 Encounters:  09/15/11 123/86  08/03/11 136/86   Blood pressure today is better but her diastolic pressure is still above her goal.  Her goal is <130/80.  We will encourage good diet and exercise which should help bring that done.  Also quitting smoking was discussed and she is thinking about quitting.

## 2011-09-17 NOTE — Assessment & Plan Note (Signed)
I think her anxiety is the main cause of her sleep problems.  She states that she has been on Zoloft 150 mg daily and Trazodone 200 mg at night to help her sleep but they don't seem to be working.  We will defer management to her psychiatrist and I encouraged her to call them and try to get an appointment to discuss her problems sleeping.

## 2011-09-17 NOTE — Assessment & Plan Note (Signed)
MRI from 06/2010 shows mild arthropathy changes but no definite compression of any nerve roots.  She has no leg symptoms and no red flag symptoms.  Since this is ongoing we will treat her with NSAIDs, stretching, ice, heat, and have her resume her exercises from PT to try to help her back feel better.  She will follow up if she is not better.

## 2011-09-17 NOTE — Assessment & Plan Note (Signed)
Lab Results  Component Value Date   HGBA1C 8.4 08/03/2011   HGBA1C 10.8* 05/06/2008   HGBA1C 6.8* 01/15/2008   Lab Results  Component Value Date   MICROALBUR 0.50 08/04/2011   LDLCALC 116* 08/04/2011   CREATININE 0.80 05/13/2011   Her A1C is due again in November but after reviewing her meter she appears to be doing better since starting metformin.  We discussed health eating with diabetes including more frequent, smaller meals instead of one large meal.  We will refer her to Lupita Leash Plyler RDE for education and help with quick meal ideas.  I encouraged her to keep taking her medication and work on diet and exercise and we will see how she does when we check in November.

## 2011-09-17 NOTE — Assessment & Plan Note (Signed)
Lab Results  Component Value Date   CHOL 181 08/04/2011   CHOL 219* 08/18/2010   CHOL 184 06/08/2010   Lab Results  Component Value Date   HDL 41 08/04/2011   HDL 63 08/18/2010   HDL 73 1/61/0960   Lab Results  Component Value Date   LDLCALC 116* 08/04/2011   LDLCALC 134* 08/18/2010   LDLCALC 93 06/08/2010   Lab Results  Component Value Date   TRIG 122 08/04/2011   TRIG 108 08/18/2010   TRIG 90 06/08/2010   Lab Results  Component Value Date   CHOLHDL 4.4 08/04/2011   CHOLHDL 3.5 Ratio 08/18/2010   CHOLHDL 2.5 Ratio 06/08/2010   No results found for this basename: LDLDIRECT   Her LDL is slightly above her goal of <100.  We will increase her pravastatin per Dr. Lavonna Monarch note and see her back.

## 2011-09-21 LAB — POCT CARDIAC MARKERS
CKMB, poc: <1 — ABNORMAL LOW
Myoglobin, poc: 44.1
Operator id: 196461
Troponin i, poc: <0.05

## 2011-09-21 LAB — DIFFERENTIAL
Basophils Absolute: 0
Basophils Relative: 1
Eosinophils Absolute: 0.2
Eosinophils Relative: 2
Lymphocytes Relative: 53 — ABNORMAL HIGH
Lymphs Abs: 5.1 — ABNORMAL HIGH
Monocytes Absolute: 0.6
Monocytes Relative: 6
Neutro Abs: 3.7
Neutrophils Relative %: 38 — ABNORMAL LOW

## 2011-09-21 LAB — B-NATRIURETIC PEPTIDE (CONVERTED LAB): Pro B Natriuretic peptide (BNP): <30

## 2011-09-21 LAB — CBC
HCT: 39.5
Hemoglobin: 12.7
MCHC: 32.1
MCV: 71.7 — ABNORMAL LOW
Platelets: 205
RBC: 5.51 — ABNORMAL HIGH
RDW: 13.8
WBC: 9.7

## 2011-09-21 LAB — POCT I-STAT, CHEM 8
BUN: 15
Calcium, Ion: 1.28
Chloride: 101
Creatinine, Ser: 1.2
Glucose, Bld: 176 — ABNORMAL HIGH
HCT: 44
Hemoglobin: 15
Potassium: 4.2
Sodium: 135
TCO2: 27

## 2011-09-21 LAB — TSH: TSH: 1.181

## 2011-09-24 LAB — CBC
HCT: 39.9
Hemoglobin: 12.6
MCHC: 31.4
MCV: 73.6 — ABNORMAL LOW
Platelets: 237
RBC: 5.43 — ABNORMAL HIGH
RDW: 14.8
WBC: 9.4

## 2011-09-24 LAB — DIFFERENTIAL
Basophils Absolute: 0
Basophils Relative: 1
Eosinophils Absolute: 0.4
Eosinophils Relative: 5
Lymphocytes Relative: 49 — ABNORMAL HIGH
Lymphs Abs: 4.6 — ABNORMAL HIGH
Monocytes Absolute: 0.7
Monocytes Relative: 7
Neutro Abs: 3.6
Neutrophils Relative %: 39 — ABNORMAL LOW

## 2011-09-24 LAB — POCT CARDIAC MARKERS
CKMB, poc: <1 — ABNORMAL LOW
Myoglobin, poc: 48.7
Troponin i, poc: 0.06 — ABNORMAL HIGH

## 2011-09-24 LAB — POCT I-STAT, CHEM 8
BUN: 12
Calcium, Ion: 1.4 — ABNORMAL HIGH
Chloride: 105
Creatinine, Ser: 1
Glucose, Bld: 91
HCT: 42
Hemoglobin: 14.3
Potassium: 4
Sodium: 137
TCO2: 26

## 2011-09-24 LAB — GLUCOSE, CAPILLARY: Glucose-Capillary: 120 — ABNORMAL HIGH

## 2011-09-29 ENCOUNTER — Telehealth: Payer: Self-pay | Admitting: Licensed Clinical Social Worker

## 2011-10-06 ENCOUNTER — Encounter: Payer: Medicaid Other | Admitting: Gastroenterology

## 2011-10-12 NOTE — Telephone Encounter (Signed)
Smoking Cessation Counseling.  Patient has been smoking for twenty years.  Smokes 1/2 pack per day.  Lives w/ brother and they smoke outside.  Patient does not have plans to quit at this time and does not know where to start.  Told Teliah about the many resources in place to help her quit including nicotine patches (which she can get through medicaid), [facility] group smoking cessation classes, Quitline, individual counseling offered here at [facility].  Patient is amenable to my sending information to her in the mail but clearly she is not thinking about quitting.

## 2011-10-14 ENCOUNTER — Ambulatory Visit (INDEPENDENT_AMBULATORY_CARE_PROVIDER_SITE_OTHER): Payer: Medicaid Other | Admitting: Dietician

## 2011-10-14 ENCOUNTER — Encounter: Payer: Self-pay | Admitting: Dietician

## 2011-10-14 VITALS — Ht 67.0 in | Wt 181.1 lb

## 2011-10-14 DIAGNOSIS — F3289 Other specified depressive episodes: Secondary | ICD-10-CM

## 2011-10-14 DIAGNOSIS — E119 Type 2 diabetes mellitus without complications: Secondary | ICD-10-CM

## 2011-10-14 DIAGNOSIS — F329 Major depressive disorder, single episode, unspecified: Secondary | ICD-10-CM

## 2011-10-14 DIAGNOSIS — F411 Generalized anxiety disorder: Secondary | ICD-10-CM

## 2011-10-14 NOTE — Progress Notes (Signed)
Diabetes Self-Management Training (DSMT)  Initial Visit  10/14/2011 [name], identified by name and date of birth, is a 55 y.o. female with Type 2 Diabetes. Year of diabetes diagnosis: 2002 Other persons present: no  ASSESSMENT Patient concerns are Nutrition/meal planning and Problem solving.  Height 5\' 7"  (1.702 m), weight 181 lb 1.6 oz (82.146 kg). Body mass index is 28.36 kg/(m^2). Lab Results  Component Value Date   LDLCALC 116* 08/04/2011   Lab Results  Component Value Date   HGBA1C 8.4 08/03/2011   Medication Nutrition Monitor: accu chek aviva  Labs reviewed.  DIABETES BUNDLE: A1C in past 6 months? Yes.  Less than 7%? No LDL in past year? Yes.  Less than 100 mg/dL? No Microalbumin ratio in past year? Yes. Blood pressure less than 130/80? No.  Sent note to MD. Foot exam in last year?yes Eye exam in past year? Yes. Tobacco use? Yes.  Smoking cessation offered? Yes Pneumovax? Yes Flu vaccine? No Asprin? Yes  Family history of diabetes: No  Support systems: family, lives with brother  Special needs: None  Prior DM Education: No   Medications See Medications list.  Has adequate knowledge and Not taking as prescribed  Patients belief/attitude about diabetes: Diabetes can be controlled.  Self foot exams daily: Yes  Diabetes Complications: None   Exercise Plan Doing walking for 25 minutesa day five days a week   Self-Monitoring Frequency of testing: 2 times/day Average of meter download (44 blood sugars) is 116.8 Breakfast: 101-144 Lunch: 79,104, 136 After Supper/bedtime: 71-185  Hyperglycemia: Yes Daily Hypoglycemia: Yes Weekly      Meal Planning Limited knowledge and Interested in improving   Assessment comments: patient reports no appetite, wants help with eating 3 meals a day. back pain reported and was hoping to get help for that today. Going to school at [employer] 5 days per week from 9 am until 2 pm. Stress due to school 5 days per week,  limited budget for food ( 200.00 food stamps per month for she and her brother) feels she cannot eat so he'll have enough likeley impacting decreased appetite and blood sugars. Skips breakfast and lunch daily, eats moderate dinners. Has had a documented 10# weight loss in past 6 weeks. Metformin likely also assisting with blood sugar control. Has not taken any Novolog in at least a month, had been taking pm novolog with lantus at bedtime after dinner when she did take it and that again was only if blood sugar was > 150 or so. Takes lantus about 3 days a week, does not take any insulin when blood sugars are around 100.  Plan to discuss patient with Child psychotherapist and physician. Consider decreased dose of lantus and discontinuing Novolog.    INDIVIDUAL DIABETES EDUCATION PLAN:  Nutrition management Acute complication: _______________________________________________________________________  Intervention TOPICS COVERED TODAY:  Nutrition management  Role of diet in the treatment of diabetes and the relationship between the three main macronutritents and blood glucose control. help with etaing 3 meals daily and decreased appetite Goal setting  Lifestyle issues that need to be addressed for better diabetes care Review risk of smoking and offered smoking cessation Psychosocial adjustment  Worked with patient to identify barriers to care and solutions Role of stress on diabetes  PATIENTS GOALS/PLAN (copy and paste in patient instructions so patient receives a copy): 1.  Learning Objective:       Try to get up earlier  2.  Behavioral Objective:  Nutrition: To improve blood glucose control I will try to carry food with you and plan for easy and healthy food options for stressful times Sometimes 25% Problem Solving: To improve my blood glucose control, I will carry food with me when I do not have time to eat. take insulin consistently   Sometimes 25%  Personalized Follow-Up Plan for Ongoing Self  Management Support:  Doctor's Office, family and CDE visits ______________________________________________________________________   Outcomes Expected outcomes: Demonstrated limited interest in learning. Expect minimal changes due to increased stress today, Demonstrated interest in learning.Expect positive changes in lifestyle when stress decreased.  Self-care Barriers: Debilitated state due to current medical condition, Lack of material resources  Education material provided: carb counting brochure  Patient to contact team via Phone if problems or questions.  Time in: 1500     Time out: 1600  Future DSMT - 2 months again hoping that next semester will be less stressful and she'll be better able to concentrate on her diabetes   Susan Holmes, Susan Holmes

## 2011-10-14 NOTE — Patient Instructions (Signed)
Try to eat 3 times each day I will:  Try to get up earlier around 6 AM  to make food for breakfast: hard boiled eggs, fruit, granola bar, oatmeal, grits.  Try to take something to school.: prunes, granola bar, raisins.  You also said to decrease stress on your life you are thinking about taking 2 classes next semester rather than the 3 you are taking now.

## 2011-10-18 ENCOUNTER — Telehealth: Payer: Self-pay | Admitting: Dietician

## 2011-10-18 NOTE — Telephone Encounter (Signed)
Patient had asked for assistance making an eye appointment. When i called the office they report that she has already been there 7/20 12 and has a follow up Dec 27th.

## 2011-10-20 ENCOUNTER — Telehealth: Payer: Self-pay | Admitting: Licensed Clinical Social Worker

## 2011-10-22 ENCOUNTER — Telehealth: Payer: Self-pay | Admitting: Licensed Clinical Social Worker

## 2011-10-25 NOTE — Telephone Encounter (Signed)
Left third message.  Sending resources in the mail for [employer].

## 2011-10-26 NOTE — Telephone Encounter (Signed)
Requested report from Dr. Bing Neighbors office be faxed to Dr. Dorise Hiss.

## 2011-10-26 NOTE — Telephone Encounter (Signed)
Resource info mailed today:  1) managing depression/stress handout 2) [employer] [facility]/ [employer] [facility] 3)  Care Line 4) [facility] Warm Line

## 2011-11-24 ENCOUNTER — Encounter: Payer: Medicaid Other | Admitting: Internal Medicine

## 2011-11-26 ENCOUNTER — Ambulatory Visit (INDEPENDENT_AMBULATORY_CARE_PROVIDER_SITE_OTHER): Payer: Medicaid Other | Admitting: Internal Medicine

## 2011-11-26 ENCOUNTER — Encounter: Payer: Self-pay | Admitting: Internal Medicine

## 2011-11-26 VITALS — BP 119/76 | HR 71 | Temp 97.0°F | Ht 67.0 in | Wt 180.1 lb

## 2011-11-26 DIAGNOSIS — I10 Essential (primary) hypertension: Secondary | ICD-10-CM

## 2011-11-26 DIAGNOSIS — M545 Low back pain, unspecified: Secondary | ICD-10-CM

## 2011-11-26 DIAGNOSIS — E785 Hyperlipidemia, unspecified: Secondary | ICD-10-CM

## 2011-11-26 DIAGNOSIS — F17200 Nicotine dependence, unspecified, uncomplicated: Secondary | ICD-10-CM

## 2011-11-26 DIAGNOSIS — F329 Major depressive disorder, single episode, unspecified: Secondary | ICD-10-CM

## 2011-11-26 DIAGNOSIS — E119 Type 2 diabetes mellitus without complications: Secondary | ICD-10-CM

## 2011-11-26 LAB — POCT GLYCOSYLATED HEMOGLOBIN (HGB A1C): Hemoglobin A1C: 6.5

## 2011-11-26 LAB — GLUCOSE, CAPILLARY: Glucose-Capillary: 254 mg/dL — ABNORMAL HIGH (ref 70–99)

## 2011-11-26 MED ORDER — INSULIN PEN NEEDLE 31G X 6 MM MISC: 1.0000 | Freq: Four times a day (QID) | Status: DC

## 2011-11-26 MED ORDER — INSULIN GLARGINE 100 UNIT/ML ~~LOC~~ SOLN: 40.0000 [IU] | Freq: Every day | SUBCUTANEOUS | Status: DC

## 2011-11-26 MED ORDER — MELOXICAM 7.5 MG PO TABS: 7.5000 mg | ORAL_TABLET | Freq: Every day | ORAL | Status: DC

## 2011-11-26 MED ORDER — INSULIN ASPART 100 UNIT/ML ~~LOC~~ SOLN: SUBCUTANEOUS | Status: DC

## 2011-11-26 MED ORDER — GLUCOSE BLOOD VI STRP: ORAL_STRIP | Status: DC

## 2011-11-26 NOTE — Assessment & Plan Note (Signed)
MRI reviewed with the patient today. No definite compression of any nerve roots. She still has no leg symptoms and no red flag symptoms. This is an ongoing problem and we will treat her with NSAIDs. We have started meloxicam today to be taken once daily. We will see her back in 2 weeks to see if she gets any relief from this. I have informed her that stretching and activity is good for her back. She can also use ice or heat. We will see her back in 2 weeks for close followup.

## 2011-11-26 NOTE — Assessment & Plan Note (Signed)
Patient's depression is reasonably well-controlled. We will not adjust regimen. It is appropriate for go from center to be managing her regimen. She is currently using trazodone for sleep, sertraline every day, and Abilify.

## 2011-11-26 NOTE — Assessment & Plan Note (Signed)
Patient's diabetes is under very good control today. Hemoglobin A1c is 6.5. No changes to her regimen at today's visit. She is using 40 units of Lantus at nighttime. 6 units, 8 units, 6 units at breakfast lunch and dinner respectively every day of NovoLog. No hypoglycemic events reported.

## 2011-11-26 NOTE — Assessment & Plan Note (Signed)
Patient states that she is still smoking up to one half pack per day. She does not wish to quit at today's visit. I did stress to her the importance of quitting in the future. She is informed that we have options and resources available to help if she does wish to quit and she will contact us if she is ready to quit. We'll rediscuss at a future visit.

## 2011-11-26 NOTE — Assessment & Plan Note (Signed)
Patient's Pravachol was increased to 40 mg at last visit. We will recheck her lipids in August of next year. Continue current regimen.

## 2011-11-26 NOTE — Assessment & Plan Note (Signed)
Patient's blood pressure is good today no changes will be made to her regimen. Blood pressure 119/76.

## 2011-11-26 NOTE — Patient Instructions (Addendum)
You were seen today for a follow up visit. The following medication was added to help with your back pain: Meloxicam (mobic). Take one pill once daily until you return to our office. We will see you back before the holidays to see if it is helping. There were no other changes to your medications today. You are doing well with your blood pressure and your diabetes. Consider stopping smoking! You can always call our office with questions or problems. Our number is [phone].

## 2011-11-26 NOTE — Progress Notes (Signed)
Subjective:    Patient ID: Susan Holmes, female    DOB: Aug 10, 1956, 55 y.o.   MRN: [id number]  HPI The patient is a 55 year-old female who comes in today for a routine followup visit. She has a prominent medical history of diabetes mellitus, hyperlipidemia, depression, hypertension, back pain. She is currently taking insulin for her diabetes and her regimen is fairly stable and her blood sugars are controlled. She denies any hypoglycemic episodes. Her depression is also fairly well controlled, she does go to [facility] for her medications. She states that Abilify was recently added approximately 2 months ago and she has not noticed a difference since then. She is not currently taking Ambien for sleep however she does use trazodone every night to help her get to sleep. She states that her back pain does wake her up at night occasionally and does to limit her a little bit throughout the day. She recently had an MRI of her back that showed some degenerative changes in L4-L5 however no impingement on the spinal cord. There was some possible nerve impingement however. She states that she has had corticosteroid injection to her back before with little to no relief. She has tried nothing else for her back pain. She has stretching exercises that she knows she is supposed to do. She takes gabapentin for some neuropathy however this does not help her low back pain. Otherwise she denies any chest pain, shortness of breath, fevers, chills, respiratory infection. She does not wish to have the flu shot. No other complaints at today's visit. She states she is taking her medications as prescribed.   Review of Systems  Constitutional: Negative for fever, chills, diaphoresis, activity change, appetite change, fatigue and unexpected weight change.  HENT: Negative.   Respiratory: Negative for cough, shortness of breath and wheezing.   Cardiovascular: Negative for chest pain, palpitations and leg swelling.    Gastrointestinal: Negative for nausea, vomiting, abdominal pain, diarrhea and constipation.  Musculoskeletal: Positive for back pain. Negative for myalgias, joint swelling and gait problem.  Skin: Negative.   Neurological: Negative for dizziness, tremors, seizures, syncope, facial asymmetry, speech difficulty, weakness, light-headedness, numbness and headaches.  Psychiatric/Behavioral: Negative.        Pt states that it takes her 1 hour to fall asleep at night and she awakes at times related to her back.    Vitals: Blood pressure: 119/76 Temperature: 47F Height: 5 feet 7 inches Weight: 180 pounds Pulse: 71 Oxygen saturation: 99% on room and    Objective:   Physical Exam  Nursing note and vitals reviewed. Constitutional: She is oriented to person, place, and time. She appears well-developed and well-nourished.  HENT:  Head: Normocephalic and atraumatic.  Eyes: EOM are normal. Pupils are equal, round, and reactive to light.  Neck: Normal range of motion. Neck supple. No JVD present. No tracheal deviation present. No thyromegaly present.  Cardiovascular: Normal rate, regular rhythm and normal heart sounds.   Pulmonary/Chest: Effort normal and breath sounds normal. No respiratory distress. She has no wheezes. She has no rales.  Abdominal: Soft. Bowel sounds are normal. She exhibits no distension. There is no tenderness. There is no rebound.  Musculoskeletal: Normal range of motion. She exhibits tenderness.       Back is tender to palpation in the L3-L5 region.  Lymphadenopathy:    She has no cervical adenopathy.  Neurological: She is alert and oriented to person, place, and time. No cranial nerve deficit. Coordination normal.  Skin: Skin is  warm and dry.          Assessment & Plan:  1. Please see problem-oriented charting  2. Disposition-patient will be seen back in approximately 2 weeks to check on her back pain. We started meloxicam today to help with her pain. At this time  if her back pain is not controlled consider switching to different NSAID. No other change to her medications as made at this time. Have recommended that she goes back to [facility] to discuss her Abilify.

## 2011-12-14 ENCOUNTER — Other Ambulatory Visit: Payer: Self-pay | Admitting: Internal Medicine

## 2011-12-14 ENCOUNTER — Ambulatory Visit (INDEPENDENT_AMBULATORY_CARE_PROVIDER_SITE_OTHER): Payer: Medicaid Other | Admitting: Internal Medicine

## 2011-12-14 ENCOUNTER — Encounter: Payer: Self-pay | Admitting: Internal Medicine

## 2011-12-14 DIAGNOSIS — E119 Type 2 diabetes mellitus without complications: Secondary | ICD-10-CM

## 2011-12-14 DIAGNOSIS — I10 Essential (primary) hypertension: Secondary | ICD-10-CM

## 2011-12-14 DIAGNOSIS — M545 Low back pain, unspecified: Secondary | ICD-10-CM

## 2011-12-14 DIAGNOSIS — R5383 Other fatigue: Secondary | ICD-10-CM | POA: Insufficient documentation

## 2011-12-14 DIAGNOSIS — R5381 Other malaise: Secondary | ICD-10-CM

## 2011-12-14 DIAGNOSIS — R234 Changes in skin texture: Secondary | ICD-10-CM | POA: Insufficient documentation

## 2011-12-14 LAB — GLUCOSE, CAPILLARY: Glucose-Capillary: 349 mg/dL — ABNORMAL HIGH (ref 70–99)

## 2011-12-14 MED ORDER — MELOXICAM 7.5 MG PO TABS: 7.5000 mg | ORAL_TABLET | Freq: Every day | ORAL | Status: DC

## 2011-12-14 MED ORDER — HYDROCHLOROTHIAZIDE 12.5 MG PO TABS: 12.5000 mg | ORAL_TABLET | Freq: Every day | ORAL | Status: DC

## 2011-12-14 MED ORDER — GLUCOSE BLOOD VI STRP: ORAL_STRIP | Status: DC

## 2011-12-14 MED ORDER — ACCU-CHEK FASTCLIX LANCETS MISC: 1.0000 | Freq: Three times a day (TID) | Status: DC

## 2011-12-14 NOTE — Progress Notes (Signed)
Subjective:   Patient ID: Susan Holmes female   DOB: Oct 04, 1956 55 y.o.   MRN: [id number]  HPI: [name] is a 55 y.o. woman who presents to clinic today for follow up from her last appointment.  She states that the medication she was given for her back pain is helping and she needs a refill on the medication.  She has not been doing her stretching and states that she was unable to go to the PT because of transportation problems.  She wakes up in the AM with stiffness in the legs and back but the medication helps her be able to do her ADLs.    She is still struggling with her sleep.  She tries to go to bed about 10 pm and is able to fall asleep about 11 then she wakes about 2-3 am and can't go back to sleep.  She recently stopped taking her trazodone and was given a new medication from [facility] that she does not know the name of but agrees that it might be Remeron.  She doesn't know dose.  She is concerned because she states her brother watched her sleep and she was snoring a lot and not breathing well.  She states that she is tired during the day and wakes up a lot at night.  She does not feel rested in the morning even when she gets more sleep.   She states that she has some itchiness on her face and across her nose.  She notes that she has been using a new soap for "uneven skin" for the last few weeks.  She has noted that across her nose is some lightening of her normal pigmentation.  She denies redness, lesions, drainage, or history of allergies.   Past Medical History  Diagnosis Date  . Hepatitis B infection pt unsure    resolved  . Diabetes mellitus, type II   . HTN (hypertension)   . Hyperlipidemia   . Congenital blindness     L eye  . Tobacco abuse     .5 PPD smoker  . Anxiety   . Depression    Current Outpatient Prescriptions  Medication Sig Dispense Refill  . ARIPiprazole (ABILIFY) 2 MG tablet Take 2 mg by mouth daily. From [facility]        .  aspirin 81 MG tablet Take 81 mg by mouth daily.        Marland Kitchen gabapentin (NEURONTIN) 300 MG capsule Take 300 mg by mouth 2 (two) times daily.        Marland Kitchen glucose blood test strip Use as instructed  100 each  12  . hydrochlorothiazide (HYDRODIURIL) 12.5 MG tablet Take 1 tablet (12.5 mg total) by mouth daily.  30 tablet  3  . insulin aspart (NOVOLOG FLEXPEN) 100 UNIT/ML injection Inject 6 units under the skin 30 min before breakfast and lunch. Inject 8 units under the skin before dinner.  9 mL  12  . insulin glargine (LANTUS SOLOSTAR) 100 UNIT/ML injection Inject 40 Units into the skin at bedtime.  9 mL  12  . Insulin Pen Needle 31G X 6 MM MISC 1 each by Does not apply route 4 (four) times daily.  100 each  11  . meloxicam (MOBIC) 7.5 MG tablet Take 1 tablet (7.5 mg total) by mouth daily.  30 tablet  0  . pantoprazole (PROTONIX) 40 MG tablet Take 40 mg by mouth daily.        . pravastatin (  PRAVACHOL) 40 MG tablet Take 1 tablet (40 mg total) by mouth daily.  30 tablet  6  . sertraline (ZOLOFT) 100 MG tablet Take 150 mg by mouth daily.        . traZODone (DESYREL) 100 MG tablet Take 200 mg by mouth at bedtime.         Family History  Problem Relation Age of Onset  . Heart disease Mother   . Hypertension Mother   . Heart disease Father   . Aneurysm Brother   . Aneurysm Sister   . Colon cancer Neg Hx   . Esophageal cancer Neg Hx   . Stomach cancer Neg Hx    History   Social History  . Marital Status: Divorced    Spouse Name: N/A    Number of Children: N/A  . Years of Education: N/A   Social History Main Topics  . Smoking status: Smoker, Current Status Unknown -- 0.5 packs/day    Types: Cigarettes  . Smokeless tobacco: None   Comment: did not want to discuss any stopping or decreasing  . Alcohol Use: No     very rarely, socially  . Drug Use: No  . Sexually Active: None   Other Topics Concern  . None   Social History Narrative  . None   Review of Systems: Negative except as noted in  the HPI.   Objective:  Physical Exam: Filed Vitals:   12/14/11 1456  BP: 136/90  Pulse: 78  Temp: 97.3 F (36.3 C)  TempSrc: Oral  Resp: 20  Height: 5\' 7"  (1.702 m)  Weight: 181 lb 11.2 oz (82.419 kg)   Constitutional: Vital signs reviewed.  Patient is a well-developed and well-nourished woman in no acute distress and cooperative with exam. Alert and oriented x3.  Head: Normocephalic and atraumatic Ear: TM normal bilaterally Nose: There is some subtle lightening of her normal pigmentation cross her nose with no erythema or lesions.  Mouth: no erythema or exudates, MMM, crowded posterior pharynx.  Eyes: PERRL, EOMI, conjunctivae normal, No scleral icterus.  Neck: Supple, Trachea midline normal ROM, No JVD, mass, thyromegaly, or carotid bruit present.  Cardiovascular: RRR, S1 normal, S2 normal, no MRG, pulses symmetric and intact bilaterally Pulmonary/Chest: CTAB, no wheezes, rales, or rhonchi Abdominal: Soft. Non-tender, non-distended, bowel sounds are normal, no masses, organomegaly, or guarding present.  Musculoskeletal: Mild tenderness to palpation of the paraspinous muscles bilaterally.  ROM of the back is limited in flexion, and bending by pain.  No joint deformities, erythema, or stiffness.  Hip ROM is limited by pain.  Hematology: no cervical, inginal, or axillary adenopathy.  Neurological: A&O x3, Strength is normal and symmetric bilaterally, cranial nerve II-XII are grossly intact, no focal motor deficit, sensory intact to light touch bilaterally.  Skin: Warm, dry and intact. No rash, cyanosis, or clubbing.  Psychiatric: Normal mood and flat affect. speech and behavior is normal. Judgment and thought content normal. Cognition and memory are normal.   Assessment & Plan:

## 2011-12-14 NOTE — Patient Instructions (Addendum)
1.  Continue with your Mobic for the pain in your back.  2.  Work on doing your stretching to help with the flexibility in your back and legs.  That is the major cause of your back pain.  You can also use Ice or Heat on your back 20 minutes at a time to help with the pain.  3.  We will work to get you set up for a sleep study to check for Sleep apnea.  4.  Continue all of your other medication as prescribed.  5.  Follow up with your primary care doctor in about 1-2 months.

## 2012-01-12 ENCOUNTER — Ambulatory Visit (HOSPITAL_BASED_OUTPATIENT_CLINIC_OR_DEPARTMENT_OTHER): Payer: Medicaid Other

## 2012-01-12 NOTE — Assessment & Plan Note (Signed)
Her facial symptoms could be from multiple causes.  She has no warning signs for concerning pathologies.  I encouraged her to use a mild soap on her skin to avoid irritation.  If she continues to have problems we will refer her over to dermatology.

## 2012-01-12 NOTE — Assessment & Plan Note (Signed)
Lab Results  Component Value Date   HGBA1C 6.5 11/26/2011   HGBA1C 8.4 08/03/2011   HGBA1C 10.8* 05/06/2008   Lab Results  Component Value Date   MICROALBUR 0.50 08/04/2011   LDLCALC 116* 08/04/2011   CREATININE 0.80 05/13/2011   Her a1c is well controlled for now. I encouraged her to continue her excellent work!

## 2012-01-12 NOTE — Assessment & Plan Note (Signed)
Lab Results  Component Value Date   NA 135 05/13/2011   K 3.9 05/13/2011   CL 101 05/13/2011   CO2 25 08/18/2010   BUN 8 05/13/2011   CREATININE 0.80 05/13/2011    BP Readings from Last 3 Encounters:  12/14/11 136/90  11/26/11 119/76  09/15/11 123/86    Assessment: Hypertension control:  mildly elevated  Progress toward goals:  deteriorated Barriers to meeting goals:  no barriers identified  Plan: Hypertension treatment:  continue current medications Encouraged her to continue taking her medication and we will follow her blood pressure at her follow up.  If she  continues to be elevated we may need to titrate her therapy.Marland Kitchen

## 2012-01-12 NOTE — Assessment & Plan Note (Signed)
She continues to struggle with insomnia and fatigue.  She has been tried on several medications.  She describes some symptoms that could indicate OSA.  She is obese and does have a crowded posterior pharynx.  We will refer her for a split night study to evaluate for the need for CPAP and if appropriate titrate CPAP therapy.

## 2012-01-12 NOTE — Assessment & Plan Note (Signed)
Her low back pain continues to be a problem.  She has been taking the Mobic which she states has been helping but has not been doing her stretches or other maneuvers given to her by PT.  I encouraged her to begin these to help treat the reason she continues to have back spasms.  We will refill her mobic for now and continue to monitor.

## 2012-02-24 ENCOUNTER — Other Ambulatory Visit: Payer: Self-pay | Admitting: Podiatry

## 2012-02-25 ENCOUNTER — Ambulatory Visit (INDEPENDENT_AMBULATORY_CARE_PROVIDER_SITE_OTHER): Payer: Medicaid Other | Admitting: Internal Medicine

## 2012-02-25 ENCOUNTER — Encounter: Payer: Self-pay | Admitting: Internal Medicine

## 2012-02-25 VITALS — BP 138/90 | HR 67 | Temp 98.1°F | Ht 67.0 in | Wt 180.0 lb

## 2012-02-25 DIAGNOSIS — M545 Low back pain, unspecified: Secondary | ICD-10-CM

## 2012-02-25 DIAGNOSIS — E119 Type 2 diabetes mellitus without complications: Secondary | ICD-10-CM

## 2012-02-25 DIAGNOSIS — E785 Hyperlipidemia, unspecified: Secondary | ICD-10-CM

## 2012-02-25 DIAGNOSIS — I10 Essential (primary) hypertension: Secondary | ICD-10-CM

## 2012-02-25 DIAGNOSIS — F329 Major depressive disorder, single episode, unspecified: Secondary | ICD-10-CM

## 2012-02-25 DIAGNOSIS — F17200 Nicotine dependence, unspecified, uncomplicated: Secondary | ICD-10-CM

## 2012-02-25 DIAGNOSIS — Z79899 Other long term (current) drug therapy: Secondary | ICD-10-CM

## 2012-02-25 DIAGNOSIS — M19019 Primary osteoarthritis, unspecified shoulder: Secondary | ICD-10-CM

## 2012-02-25 LAB — POCT GLYCOSYLATED HEMOGLOBIN (HGB A1C): Hemoglobin A1C: 9.1

## 2012-02-25 LAB — GLUCOSE, CAPILLARY: Glucose-Capillary: 110 mg/dL — ABNORMAL HIGH (ref 70–99)

## 2012-02-25 MED ORDER — INSULIN PEN NEEDLE 31G X 6 MM MISC: 1.0000 | Freq: Four times a day (QID) | Status: DC

## 2012-02-25 MED ORDER — ACCU-CHEK FASTCLIX LANCETS MISC: 1.0000 | Freq: Three times a day (TID) | Status: DC

## 2012-02-25 MED ORDER — INSULIN ASPART 100 UNIT/ML ~~LOC~~ SOLN: SUBCUTANEOUS | Status: DC

## 2012-02-25 MED ORDER — INSULIN GLARGINE 100 UNIT/ML ~~LOC~~ SOLN: 40.0000 [IU] | Freq: Every day | SUBCUTANEOUS | Status: DC

## 2012-02-25 MED ORDER — MELOXICAM 7.5 MG PO TABS: 7.5000 mg | ORAL_TABLET | Freq: Every day | ORAL | Status: DC

## 2012-02-25 MED ORDER — GLUCOSE BLOOD VI STRP: ORAL_STRIP | Status: DC

## 2012-02-25 NOTE — Patient Instructions (Signed)
You were seen today for your diabetes and your shoulder pain. We think that your shoulder pain may be early arthritis that has flares. Continue stretching and using pain medicine so you can do your activities. Your diabetes is worse today and we would like you to go back to taking novolog with dinner 6 units. We will see you back in one month to check on your sugars. If you feel you need to be seen sooner or have problems or questions feel free to call our clinic at [phone].  Shoulder Exercises EXERCISES  RANGE OF MOTION (ROM) AND STRETCHING EXERCISES These exercises may help you when beginning to rehabilitate your injury. Your symptoms may resolve with or without further involvement from your physician, physical therapist or athletic trainer. While completing these exercises, remember:   Restoring tissue flexibility helps normal motion to return to the joints. This allows healthier, less painful movement and activity.   An effective stretch should be held for at least 30 seconds.   A stretch should never be painful. You should only feel a gentle lengthening or release in the stretched tissue.  ROM - Pendulum  Bend at the waist so that your right / left arm falls away from your body. Support yourself with your opposite hand on a solid surface, such as a table or a countertop.   Your right / left arm should be perpendicular to the ground. If it is not perpendicular, you need to lean over farther. Relax the muscles in your right / left arm and shoulder as much as possible.   Gently sway your hips and trunk so they move your right / left arm without any use of your right / left shoulder muscles.   Progress your movements so that your right / left arm moves side to side, then forward and backward, and finally, both clockwise and counterclockwise.   Complete __________ repetitions in each direction. Many people use this exercise to relieve discomfort in their shoulder as well as to gain range  of motion.  Repeat __________ times. Complete this exercise __________ times per day. STRETCH - Flexion, Standing  Stand with good posture. With an underhand grip on your right / left hand and an overhand grip on the opposite hand, grasp a broomstick or cane so that your hands are a little more than shoulder-width apart.   Keeping your right / left elbow straight and shoulder muscles relaxed, push the stick with your opposite hand to raise your right / left arm in front of your body and then overhead. Raise your arm until you feel a stretch in your right / left shoulder, but before you have increased shoulder pain.   Try to avoid shrugging your right / left shoulder as your arm rises by keeping your shoulder blade tucked down and toward your mid-back spine. Hold __________ seconds.   Slowly return to the starting position.  Repeat __________ times. Complete this exercise __________ times per day. STRETCH - Internal Rotation  Place your right / left hand behind your back, palm-up.   Throw a towel or belt over your opposite shoulder. Grasp the towel/belt with your right / left hand.   While keeping an upright posture, gently pull up on the towel/belt until you feel a stretch in the front of your right / left shoulder.   Avoid shrugging your right / left shoulder as your arm rises by keeping your shoulder blade tucked down and toward your mid-back spine.   Hold __________. Release the  stretch by lowering your opposite hand.  Repeat __________ times. Complete this exercise __________ times per day. STRETCH - External Rotation and Abduction  Stagger your stance through a doorframe. It does not matter which foot is forward.   As instructed by your physician, physical therapist or athletic trainer, place your hands:   And forearms above your head and on the door frame.   And forearms at head-height and on the door frame.   At elbow-height and on the door frame.   Keeping your head and  chest upright and your stomach muscles tight to prevent over-extending your low-back, slowly shift your weight onto your front foot until you feel a stretch across your chest and/or in the front of your shoulders.   Hold __________ seconds. Shift your weight to your back foot to release the stretch.  Repeat __________ times. Complete this stretch __________ times per day.  STRENGTHENING EXERCISES  These exercises may help you when beginning to rehabilitate your injury. They may resolve your symptoms with or without further involvement from your physician, physical therapist or athletic trainer. While completing these exercises, remember:   Muscles can gain both the endurance and the strength needed for everyday activities through controlled exercises.   Complete these exercises as instructed by your physician, physical therapist or athletic trainer. Progress the resistance and repetitions only as guided.   You may experience muscle soreness or fatigue, but the pain or discomfort you are trying to eliminate should never worsen during these exercises. If this pain does worsen, stop and make certain you are following the directions exactly. If the pain is still present after adjustments, discontinue the exercise until you can discuss the trouble with your clinician.   If advised by your physician, during your recovery, avoid activity or exercises which involve actions that place your right / left hand or elbow above your head or behind your back or head. These positions stress the tissues which are trying to heal.  STRENGTH - Scapular Depression and Adduction  With good posture, sit on a firm chair. Supported your arms in front of you with pillows, arm rests or a table top. Have your elbows in line with the sides of your body.   Gently draw your shoulder blades down and toward your mid-back spine. Gradually increase the tension without tensing the muscles along the top of your shoulders and the back  of your neck.   Hold for __________ seconds. Slowly release the tension and relax your muscles completely before completing the next repetition.   After you have practiced this exercise, remove the arm support and complete it in standing as well as sitting.  Repeat __________ times. Complete this exercise __________ times per day.  STRENGTH - External Rotators  Secure a rubber exercise band/tubing to a fixed object so that it is at the same height as your right / left elbow when you are standing or sitting on a firm surface.   Stand or sit so that the secured exercise band/tubing is at your side that is not injured.   Bend your elbow 90 degrees. Place a folded towel or small pillow under your right / left arm so that your elbow is a few inches away from your side.   Keeping the tension on the exercise band/tubing, pull it away from your body, as if pivoting on your elbow. Be sure to keep your body steady so that the movement is only coming from your shoulder rotating.   Hold __________ seconds.  Release the tension in a controlled manner as you return to the starting position.  Repeat __________ times. Complete this exercise __________ times per day.  STRENGTH - Supraspinatus  Stand or sit with good posture. Grasp a __________ weight or an exercise band/tubing so that your hand is "thumbs-up," like when you shake hands.   Slowly lift your right / left hand from your thigh into the air, traveling about 30 degrees from straight out at your side. Lift your hand to shoulder height or as far as you can without increasing any shoulder pain. Initially, many people do not lift their hands above shoulder height.   Avoid shrugging your right / left shoulder as your arm rises by keeping your shoulder blade tucked down and toward your mid-back spine.   Hold for __________ seconds. Control the descent of your hand as you slowly return to your starting position.  Repeat __________ times. Complete this  exercise __________ times per day.  STRENGTH - Shoulder Extensors  Secure a rubber exercise band/tubing so that it is at the height of your shoulders when you are either standing or sitting on a firm arm-less chair.   With a thumbs-up grip, grasp an end of the band/tubing in each hand. Straighten your elbows and lift your hands straight in front of you at shoulder height. Step back away from the secured end of band/tubing until it becomes tense.   Squeezing your shoulder blades together, pull your hands down to the sides of your thighs. Do not allow your hands to go behind you.   Hold for __________ seconds. Slowly ease the tension on the band/tubing as you reverse the directions and return to the starting position.  Repeat __________ times. Complete this exercise __________ times per day.  STRENGTH - Scapular Retractors  Secure a rubber exercise band/tubing so that it is at the height of your shoulders when you are either standing or sitting on a firm arm-less chair.   With a palm-down grip, grasp an end of the band/tubing in each hand. Straighten your elbows and lift your hands straight in front of you at shoulder height. Step back away from the secured end of band/tubing until it becomes tense.   Squeezing your shoulder blades together, draw your elbows back as you bend them. Keep your upper arm lifted away from your body throughout the exercise.   Hold __________ seconds. Slowly ease the tension on the band/tubing as you reverse the directions and return to the starting position.  Repeat __________ times. Complete this exercise __________ times per day. STRENGTH - Scapular Depressors  Find a sturdy chair without wheels, such as a from a dining room table.   Keeping your feet on the floor, lift your bottom from the seat and lock your elbows.   Keeping your elbows straight, allow gravity to pull your body weight down. Your shoulders will rise toward your ears.   Raise your body against  gravity by drawing your shoulder blades down your back, shortening the distance between your shoulders and ears. Although your feet should always maintain contact with the floor, your feet should progressively support less body weight as you get stronger.   Hold __________ seconds. In a controlled and slow manner, lower your body weight to begin the next repetition.  Repeat __________ times. Complete this exercise __________ times per day.  Document Released: 10/27/2005 Document Revised: 08/25/2011 Document Reviewed: 03/27/2009 [facility] Patient Information 2012 [place], [place].

## 2012-02-26 ENCOUNTER — Encounter: Payer: Self-pay | Admitting: Internal Medicine

## 2012-02-26 DIAGNOSIS — M7531 Calcific tendinitis of right shoulder: Secondary | ICD-10-CM | POA: Insufficient documentation

## 2012-02-26 NOTE — Assessment & Plan Note (Signed)
Patient was doing very well with her diabetes she is taking Lantus. She takes 40 units at bedtime daily. Unfortunately between last visit and this visit she decided that NovoLog was not necessary for her and stopped taking it. During this time her hemoglobin A1c has rebounded from 6.5 and is currently 9.1. Have advised her that she does need NovoLog and would like her to go back to taking it with dinner time. She will take 6 units of NovoLog with dinner every night. She will be seen back in one month for close followup to ensure he is not having hypoglycemic events. Did advise her to bring her meter with her to every visit. She did bring her meter to today's visit and 95% of the values were hyperglycemic.

## 2012-02-26 NOTE — Assessment & Plan Note (Signed)
Still feels as though she doesn't have quite enough energy. Have advised her to change dosing from 1-1/2 tablets of 100 mg sertraline to 2 full tablets. So total daily dose is 200 mg of sertraline daily. Will reevaluate in one month.

## 2012-02-26 NOTE — Assessment & Plan Note (Signed)
Patient does have history of injury to right shoulder long time ago in the past. We did discuss the fact that she may have early osteoarthritis in the shoulder. She is currently in flare. She is using Mobic to calm down the flare and it does seem to be helping. I did advise her to stretch regularly. Did give her stretching exercises at today's visit. Did advise her that we may never be able to take away the pain entirely however we may be able to control it so that she can complete her activities of daily living.

## 2012-02-26 NOTE — Assessment & Plan Note (Signed)
Patient is taking a statin. Will continue to take that.

## 2012-02-26 NOTE — Progress Notes (Signed)
Subjective:     Patient ID: Susan Holmes, female   DOB: [birth year], 56 y.o.   MRN: [id number]  HPI The patient is a 56 year old female who comes in today for a followup visit for her diabetes. She is also experiencing some back pain and shoulder pain. The shoulder is an area where she did have an old injury and she states that it does flare up occasionally. She does use Mobic for this pain and she states that it does help temporarily. She states that when she does have these flares she takes the Mobic for a few days and then it calms down. We have discussed this pain in the past I advised her to do stretching exercises even when her shoulder was feeling good to increase the range of motion and stabilize the joint. We did discuss the fact that she probably does have early osteoarthritis in the shoulder from this past injury. She is also taking Lantus for her diabetes. She states that she used take NovoLog before dinnertime however does not take it for several weeks to months. She states that she did not think she needed the NovoLog. She states that she is very tired all the time and that she is sleeping a lot. I did discuss with her the fact that Dr. Tonny Branch had tried to get her a sleep study at last visit. I did discuss the fact that sleep apnea he can cause you to sleep a lot and to still be tired during the day. She does not wish to do a sleep study at this time. She states that even if she needed a CPAP machine she would not want to use it so she would not like to do the sleep study. She has noticed that her sugars have been slightly elevated at home recently and we discussed the fact that this may also be making her feel extra tired. She has not been otherwise sick she's not having any other cough or cold symptoms she has not had any chest pain, abdominal pain, fever, chills, nausea, vomiting, diarrhea. Otherwise she states she is taking her medications and does bring those to today's visit. She did have  one medication that had been DC'd the past that I confiscated and give her the pills. There were multiple pills floating around in the bottom of her medication bag which I also took and discarded as she did not know which ones they were. We also discussed her mood and she states that she her mood is doing well but it could be a little bit better. She is currently still smoking and does not wish to quit at this time although I did advise her to.  Review of Systems  Constitutional: Positive for fatigue. Negative for fever, chills, diaphoresis, activity change, appetite change and unexpected weight change.  HENT: Negative.   Eyes: Negative.   Respiratory: Negative for apnea, cough, choking, chest tightness, shortness of breath, wheezing and stridor.   Cardiovascular: Negative for chest pain, palpitations and leg swelling.  Gastrointestinal: Negative for nausea, vomiting, abdominal pain, diarrhea, constipation and abdominal distention.  Genitourinary: Negative.   Musculoskeletal: Positive for back pain and arthralgias. Negative for myalgias, joint swelling and gait problem.  Skin: Negative.   Neurological: Negative for dizziness, tremors, seizures, syncope, facial asymmetry, speech difficulty, weakness, light-headedness, numbness and headaches.  Hematological: Negative.   Psychiatric/Behavioral: Negative.     Vitals: Blood pressure: 138/90 Pulse: 67 Temperature: 98.80F Oxygen saturation: 98% on room air Weight: 180 pounds  Objective:   Physical Exam  Nursing note and vitals reviewed. Constitutional: She is oriented to person, place, and time. She appears well-developed and well-nourished.  HENT:  Head: Normocephalic and atraumatic.  Eyes: EOM are normal. Pupils are equal, round, and reactive to light.  Neck: Normal range of motion. Neck supple.  Cardiovascular: Normal rate and regular rhythm.   Pulmonary/Chest: Effort normal and breath sounds normal.  Abdominal: Soft. Bowel sounds are  normal. She exhibits no distension. There is no tenderness. There is no rebound and no guarding.  Musculoskeletal: Normal range of motion. She exhibits no edema and no tenderness.       Right shoulder has full ROM and is tender to palpation and tender with ROM.   Neurological: She is alert and oriented to person, place, and time. No cranial nerve deficit.  Skin: Skin is warm and dry.       Assessment/Plan:     1. Please see problem-oriented charting  2. Disposition-patient will be seen back in one month for close followup of her sugars. We are advising her to go back to taking her NovoLog at dinnertime as she has stopped this for quite some time and her hemoglobin A1c has gone from being controlled to uncontrolled. We have advised her this may may be making her tired. Also advised her to change sertraline dose from 150 mg daily to 200 mg daily. Have advised her she can take Mobic for the shoulder and did give her stretching exercises. Did advise her that even though the shoulder may be feeling good soon she should continue to stretch it to keep it from flaring up in the future. We will see her back in one month to check to make sure she's not had any hypoglycemic episodes and to encourage her to continue taking her NovoLog.

## 2012-02-26 NOTE — Assessment & Plan Note (Signed)
Patient is still smoking. I did advise her to quit at today's visit. She is still not quite ready to quit. Will reevaluate in the future.

## 2012-02-26 NOTE — Assessment & Plan Note (Signed)
Blood pressures controlled at today's visit. Blood pressure is 138/80. No changes to her medical regimen at this time. She is only taking HCTZ 12.5 mg daily.

## 2012-03-28 ENCOUNTER — Encounter: Payer: Self-pay | Admitting: Internal Medicine

## 2012-03-28 ENCOUNTER — Ambulatory Visit (INDEPENDENT_AMBULATORY_CARE_PROVIDER_SITE_OTHER): Payer: Medicaid Other | Admitting: Internal Medicine

## 2012-03-28 DIAGNOSIS — R109 Unspecified abdominal pain: Secondary | ICD-10-CM

## 2012-03-28 DIAGNOSIS — I10 Essential (primary) hypertension: Secondary | ICD-10-CM

## 2012-03-28 DIAGNOSIS — F329 Major depressive disorder, single episode, unspecified: Secondary | ICD-10-CM

## 2012-03-28 DIAGNOSIS — E119 Type 2 diabetes mellitus without complications: Secondary | ICD-10-CM

## 2012-03-28 LAB — COMPLETE METABOLIC PANEL WITH GFR
ALT: 12 U/L (ref 0–35)
AST: 14 U/L (ref 0–37)
Albumin: 4.1 g/dL (ref 3.5–5.2)
Alkaline Phosphatase: 79 U/L (ref 39–117)
BUN: 14 mg/dL (ref 6–23)
CO2: 26 mEq/L (ref 19–32)
Calcium: 10 mg/dL (ref 8.4–10.5)
Chloride: 107 mEq/L (ref 96–112)
Creat: 0.95 mg/dL (ref 0.50–1.10)
GFR, Est African American: 77 mL/min
GFR, Est Non African American: 67 mL/min
Glucose, Bld: 120 mg/dL — ABNORMAL HIGH (ref 70–99)
Potassium: 4.2 mEq/L (ref 3.5–5.3)
Sodium: 141 mEq/L (ref 135–145)
Total Bilirubin: 0.4 mg/dL (ref 0.3–1.2)
Total Protein: 7 g/dL (ref 6.0–8.3)

## 2012-03-28 LAB — POCT URINALYSIS DIPSTICK
Bilirubin, UA: NEGATIVE
Blood, UA: NEGATIVE
Glucose, UA: NEGATIVE
Ketones, UA: NEGATIVE
Nitrite, UA: NEGATIVE
Protein, UA: NEGATIVE
Spec Grav, UA: >=1.03
Urobilinogen, UA: 0.2
pH, UA: 6

## 2012-03-28 LAB — TSH: TSH: 1.17 u[IU]/mL (ref 0.350–4.500)

## 2012-03-28 LAB — GLUCOSE, CAPILLARY: Glucose-Capillary: 131 mg/dL — ABNORMAL HIGH (ref 70–99)

## 2012-03-28 MED ORDER — LISINOPRIL 5 MG PO TABS: 5.0000 mg | ORAL_TABLET | Freq: Every day | ORAL | Status: DC

## 2012-03-28 MED ORDER — PROMETHAZINE HCL 12.5 MG PO TABS: 12.5000 mg | ORAL_TABLET | Freq: Four times a day (QID) | ORAL | Status: DC | PRN

## 2012-03-28 NOTE — Patient Instructions (Signed)
Please, pick up your new Prescription at your pharmacy: 1. Phenergan for nausea 2. Stop taking Meloxicam as it may make your abdominal pain worse. 3. Start new medication called Lisinopril 5 mg one tablet by mouth each morning for an elevated blood pressure and kidney protection since you have a diabetes. 4. Please, call 911 and/or go to Ed if abdominal pain/nausea/vomiting get worse. 5. Please, follow up in 2-3 days or sooner and call with any questions.

## 2012-03-28 NOTE — Progress Notes (Signed)
Addended by: Denna Haggard on: 03/28/2012 11:09 AM   Modules accepted: Orders

## 2012-03-28 NOTE — Progress Notes (Signed)
Patient ID: Susan Holmes, female   DOB: 11/21/1956, 56 y.o.   MRN: [id number] HPI:   56 y/o AA woman with a Hx of major depression and ongoing smoking, presents with bilateral LQ abdominal camping pain of a 2 weeks duration. Constant, without any particular pattern, no radiculopathy, worse with meals, slightly better with pepto bismol but does not last. Associated with nausea and one episode of emesis with "brown stuff." Denies any fever, chills, sweats, dysuria, hematuria, diarrhea, constipation, black stools, blood in stools; last BM early this am regular in color and consistency. Patient has had salpingo-hysterectomy in the past for an unknown reason. She is not sexually active and denies any vaginal symptoms. Patient has flat affect and a very poor historian giving "yes,"No" answers mostly.  Review of Systems: Negative except per history of present illness  Physical Exam:  Nursing notes and vitals reviewed General:  alert, well-developed, and cooperative to examination.   Lungs:  normal respiratory effort, no accessory muscle use, normal breath sounds, no crackles, and no wheezes. Heart:  normal rate, regular rhythm, no murmurs, no gallop, and no rub.   Abdomen:  soft, non-tender, normal bowel sounds, no distention, no guarding, no rebound tenderness, no hepatomegaly, and no splenomegaly.   Extremities:  No cyanosis, clubbing, edema Neurologic:  alert & oriented X3, nonfocal exam  Meds: Medications Prior to Admission  Medication Sig Dispense Refill  . ACCU-CHEK FASTCLIX LANCETS MISC 1 each by Does not apply route 3 (three) times daily.  102 each  11  . ARIPiprazole (ABILIFY) 2 MG tablet Take 2 mg by mouth daily. From [facility]        . aspirin 81 MG tablet Take 81 mg by mouth daily.        Marland Kitchen gabapentin (NEURONTIN) 300 MG capsule Take 300 mg by mouth 2 (two) times daily.        Marland Kitchen glucose blood test strip Use as instructed  100 each  12  . hydrochlorothiazide (HYDRODIURIL) 12.5 MG  tablet Take 1 tablet (12.5 mg total) by mouth daily.  30 tablet  3  . insulin aspart (NOVOLOG FLEXPEN) 100 UNIT/ML injection Inject 6 units under the skin 30 min before breakfast and lunch. Inject 8 units under the skin before dinner.  9 mL  12  . insulin glargine (LANTUS SOLOSTAR) 100 UNIT/ML injection Inject 40 Units into the skin at bedtime.  9 mL  12  . Insulin Pen Needle 31G X 6 MM MISC 1 each by Does not apply route 4 (four) times daily.  100 each  11  . meloxicam (MOBIC) 7.5 MG tablet Take 1 tablet (7.5 mg total) by mouth daily.  30 tablet  0  . pravastatin (PRAVACHOL) 40 MG tablet Take 1 tablet (40 mg total) by mouth daily.  30 tablet  6  . sertraline (ZOLOFT) 100 MG tablet Take 150 mg by mouth daily.         No current facility-administered medications on file as of 03/28/2012.    Allergies: Review of patient's allergies indicates no known allergies. Past Medical History  Diagnosis Date  . Hepatitis B infection pt unsure    resolved  . Diabetes mellitus, type II   . HTN (hypertension)   . Hyperlipidemia   . Congenital blindness     L eye  . Tobacco abuse     .5 PPD smoker  . Anxiety   . Depression    Past Surgical History  Procedure Date  . Abdominal hysterectomy  Family History  Problem Relation Age of Onset  . Heart disease Mother   . Hypertension Mother   . Heart disease Father   . Aneurysm Brother   . Aneurysm Sister   . Colon cancer Neg Hx   . Esophageal cancer Neg Hx   . Stomach cancer Neg Hx    History   Social History  . Marital Status: Divorced    Spouse Name: N/A    Number of Children: N/A  . Years of Education: N/A   Occupational History  . Not on file.   Social History Main Topics  . Smoking status: Smoker, Current Status Unknown -- 0.5 packs/day    Types: Cigarettes  . Smokeless tobacco: Not on file   Comment: did not want to discuss any stopping or decreasing  . Alcohol Use: No  . Drug Use: No  . Sexually Active: Not on file   Other  Topics Concern  . Not on file   Social History Narrative  . No narrative on file    A/P: 1. Bilateral lower abdominal pain -Unclear etiology at this point: r/t poorly controlled DM? Vs viral gastritis? -UA with Sp Gravity of 1.030 otherwise unremarkable -Will order CBC, CMET, TSH, UDS, FOBT x3 (refused colonoscopy screening) - Phenergan for nausea for now -hydration D/C meloxicam -instructed to call 911 and/or go to Ed if Sx worsen  -Otherwise will f/u in 2-3 days.  2. DM, type 2, poorly controlled with lst HgbA1c of 9 as of 3/13/ -UA without ketones -weight management -diet, medications, S:S of hypoglycemia reviewed with the patient -foot exam done today -Urine microalbumin -will repeat HgbA1C in 2 months  3. HTN (?) - poorly controlled - start Lisinopril 5 mg PO daily - will follow renal fxn -smoking cessation couseling  4. Depression -patient is on disability -stable, denies SI/HI or mania - continue with current regimen

## 2012-03-29 LAB — MICROALBUMIN / CREATININE URINE RATIO
Creatinine, Urine: 106.3 mg/dL
Microalb Creat Ratio: 5.6 mg/g (ref 0.0–30.0)
Microalb, Ur: 0.6 mg/dL (ref 0.00–1.89)

## 2012-03-29 LAB — PRESCRIPTION ABUSE MONITORING 15P, URINE
Amphetamine/Meth: NEGATIVE ng/mL
Barbiturate Screen, Urine: NEGATIVE ng/mL
Benzodiazepine Screen, Urine: NEGATIVE ng/mL
Buprenorphine, Urine: NEGATIVE ng/mL
Cannabinoid Scrn, Ur: NEGATIVE ng/mL
Carisoprodol, Urine: NEGATIVE ng/mL
Cocaine Metabolites: NEGATIVE ng/mL
Creatinine, Urine: 108.5 mg/dL (ref >20.0)
Fentanyl, Ur: NEGATIVE ng/mL
Meperidine, Ur: NEGATIVE ng/mL
Methadone Screen, Urine: NEGATIVE ng/mL
Opiate Screen, Urine: NEGATIVE ng/mL
Oxycodone Screen, Ur: NEGATIVE ng/mL
Propoxyphene: NEGATIVE ng/mL
Tramadol Scrn, Ur: NEGATIVE ng/mL
Zolpidem, Urine: NEGATIVE ng/mL

## 2012-03-29 LAB — CBC
HCT: 39.6 % (ref 36.0–46.0)
Hemoglobin: 11.9 g/dL — ABNORMAL LOW (ref 12.0–15.0)
MCH: 20.9 pg — ABNORMAL LOW (ref 26.0–34.0)
MCHC: 30.1 g/dL (ref 30.0–36.0)
MCV: 69.5 fL — ABNORMAL LOW (ref 78.0–100.0)
Platelets: 244 10*3/uL (ref 150–400)
RBC: 5.7 MIL/uL — ABNORMAL HIGH (ref 3.87–5.11)
RDW: 16.6 % — ABNORMAL HIGH (ref 11.5–15.5)
WBC: 7.7 10*3/uL (ref 4.0–10.5)

## 2012-04-04 ENCOUNTER — Other Ambulatory Visit (INDEPENDENT_AMBULATORY_CARE_PROVIDER_SITE_OTHER): Payer: Medicaid Other

## 2012-04-04 DIAGNOSIS — R109 Unspecified abdominal pain: Secondary | ICD-10-CM

## 2012-04-04 LAB — POC HEMOCCULT BLD/STL (HOME/3-CARD/SCREEN)
Card #2 Fecal Occult Blod, POC: NEGATIVE
Card #3 Fecal Occult Blood, POC: NEGATIVE
Fecal Occult Blood, POC: NEGATIVE

## 2012-04-14 ENCOUNTER — Telehealth: Payer: Self-pay | Admitting: *Deleted

## 2012-04-14 MED ORDER — FEXOFENADINE HCL 60 MG PO TABS: 60.0000 mg | ORAL_TABLET | Freq: Every day | ORAL | Status: DC

## 2012-04-14 NOTE — Telephone Encounter (Signed)
Having problems with seasonal allergies - has used Allegra in past with no problems. Uses [facility] [address] and [employer]. Stanton Kidney Donia Yokum RN 04/14/12 2:45PM

## 2012-04-14 NOTE — Telephone Encounter (Signed)
Done, if she has problems with it she can call back.  Thanks, Dr. Dorise Hiss

## 2012-04-21 ENCOUNTER — Ambulatory Visit (INDEPENDENT_AMBULATORY_CARE_PROVIDER_SITE_OTHER): Payer: Medicaid Other | Admitting: Internal Medicine

## 2012-04-21 VITALS — BP 125/88 | HR 83 | Temp 98.7°F | Ht 67.0 in | Wt 183.4 lb

## 2012-04-21 DIAGNOSIS — M67919 Unspecified disorder of synovium and tendon, unspecified shoulder: Secondary | ICD-10-CM

## 2012-04-21 DIAGNOSIS — M25511 Pain in right shoulder: Secondary | ICD-10-CM

## 2012-04-21 DIAGNOSIS — M19019 Primary osteoarthritis, unspecified shoulder: Secondary | ICD-10-CM

## 2012-04-21 LAB — BASIC METABOLIC PANEL
BUN: 14 mg/dL (ref 6–23)
CO2: 25 mEq/L (ref 19–32)
Calcium: 10.3 mg/dL (ref 8.4–10.5)
Chloride: 102 mEq/L (ref 96–112)
Creat: 0.9 mg/dL (ref 0.50–1.10)
Glucose, Bld: 296 mg/dL — ABNORMAL HIGH (ref 70–99)
Potassium: 4.5 mEq/L (ref 3.5–5.3)
Sodium: 136 mEq/L (ref 135–145)

## 2012-04-21 MED ORDER — MELOXICAM 7.5 MG PO TABS: 7.5000 mg | ORAL_TABLET | Freq: Every day | ORAL | Status: DC
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
## 2012-04-21 NOTE — Patient Instructions (Signed)
Adhesive Capsulitis Sometimes the shoulder becomes stiff and is painful to move. Some people say it feels as if the shoulder is frozen in place. Because of this, the condition is called "frozen shoulder." Its medical name is adhesive capsulitis.  The shoulder joint is made up of strong connective tissue that attaches the ball of the humerus to the shallow shoulder socket. This strong connective tissue is called the joint capsule. This tissue can become stiff and swollen. That is when adhesive capsulitis sets in. CAUSES  It is not always clear just what the cause adhesive capsulitis. Possibilities include:  Injury to the shoulder joint.   Strain. This is a repetitive injury brought about by overuse.   Lack of use. Perhaps your arm or hand was otherwise injured. It might have been in a sling for awhile. Or perhaps you were not using it to avoid pain.   Referred pain. This is a sort of trick the body plays. You feel pain in the shoulder. But, the pain actually comes from an injury somewhere else in the body.   Long-standing health problems. Several diseases can cause adhesive capsulitis. They include diabetes, heart disease, stroke, thyroid problems, rheumatoid arthritis and lung disease.   Being a women older than 40. Anyone can develop adhesive capsulitis but it is most common in women in this age group.  SYMPTOMS   Pain.   It occurs when the arm is moved.   Parts of the shoulder might hurt if they are touched.   Pain is worse at night or when resting.   Soreness. It might not be strong enough to be called pain. But, the shoulder aches.   The shoulder does not move freely.   Muscle spasms.   Trouble sleeping because of shoulder ache or pain.  DIAGNOSIS  To decide if you have adhesive capsulitis, your healthcare provider will probably:  Ask about symptoms you have noticed.   Ask about your history of joint pain and anything that might have caused the pain.   Ask about your  overall health.   Use hands to feel your shoulder and neck.   Ask you to move your shoulder in specific directions. This may indicate the origin of the pain.   Order imaging tests; pictures of the shoulder. They help pinpoint the source of the problem. An X-ray might be used. For more detail, an MRI is often used. An MRI details the tendons, muscles and ligaments as well as the joint.  TREATMENT  Adhesive capsulitis can be treated several ways. Most treatments can be done in a clinic or in your healthcare provider's office. Be sure to discuss the different options with your caregiver. They include:  Physical therapy. You will work on specific exercises to get your shoulder moving again. The exercises usually involve stretching. A physical therapist (a caregiver with special training) can show you what to do and what not to do. The exercises will need to be done daily.   Medication.   Over-the-counter medicines may relieve pain and inflammation (the body's way of reacting to injury or infection).   Corticosteroids. These are stronger drugs to reduce pain and inflammation. They are given by injection (shots) into the shoulder joint. Frequent treatment is not recommended.   Muscle relaxants. Medication may be prescribed to ease muscle spasms.   Treatment of underlying conditions. This means treating another condition that is causing your shoulder problem. This might be a rotator cuff (tendon) problem   Shoulder manipulation. The shoulder will   be moved by your healthcare provider. You would be under general anesthesia (given a drug that puts you to sleep). You would not feel anything. Sometimes the joint will be injected with salt water (saline) at high pressure to break down internal scarring in the joint capsule.   Surgery. This is rarely needed. It may be suggested in advanced cases after all other treatment has failed.  PROGNOSIS  In time, most people recover from adhesive capsulitis.  Sometimes, however, the pain goes away but full movement of the shoulder does not return.  HOME CARE INSTRUCTIONS   Take any pain medications recommended by your healthcare provider. Follow the directions carefully.   If you have physical therapy, follow through with the therapist's suggestions. Be sure you understand the exercises you will be doing. You should understand:   How often the exercises should be done.   How many times each exercise should be repeated.   How long they should be done.   What other activities you should do, or not do.   That you should warm up before doing any exercise. Just 5 to 10 minutes will help. Small, gentle movements should get your shoulder ready for more.   Avoid high-demand exercise that involves your shoulder such as throwing. This type of exercise can make pain worse.   Consider using cold packs. Cold may ease swelling and pain. Ask your healthcare provider if a cold pack might help you. If so, get directions on how and when to use them.  SEEK MEDICAL CARE IF:   You have any questions about your medications.   Your pain continues to increase.  Document Released: 10/10/2009 Document Revised: 12/02/2011 Document Reviewed: 10/10/2009 [employer] Patient Information 2012 [employer]. 

## 2012-04-21 NOTE — Assessment & Plan Note (Signed)
Continues to pain and physical exam findings suggest adhesive capsulitis Will refer to sports medicines  Prescribe mobic

## 2012-04-24 ENCOUNTER — Other Ambulatory Visit: Payer: Self-pay | Admitting: *Deleted

## 2012-04-24 DIAGNOSIS — I10 Essential (primary) hypertension: Secondary | ICD-10-CM

## 2012-04-24 DIAGNOSIS — E119 Type 2 diabetes mellitus without complications: Secondary | ICD-10-CM

## 2012-04-24 MED ORDER — ASPIRIN 81 MG PO TABS: 81.0000 mg | ORAL_TABLET | Freq: Every day | ORAL | Status: DC

## 2012-04-27 ENCOUNTER — Other Ambulatory Visit: Payer: Self-pay | Admitting: Internal Medicine

## 2012-04-28 ENCOUNTER — Other Ambulatory Visit: Payer: Self-pay | Admitting: *Deleted

## 2012-04-28 DIAGNOSIS — E119 Type 2 diabetes mellitus without complications: Secondary | ICD-10-CM

## 2012-04-28 DIAGNOSIS — I10 Essential (primary) hypertension: Secondary | ICD-10-CM

## 2012-04-28 MED ORDER — PROMETHAZINE HCL 12.5 MG PO TABS: 12.5000 mg | ORAL_TABLET | Freq: Four times a day (QID) | ORAL | Status: DC | PRN

## 2012-04-28 NOTE — Telephone Encounter (Signed)
This was for an acute issue and was not to be chronic. Pt was asked to return to clinic if sxs persisted. Since weekend, will refill small supply and ask that py make appt to re-eval sxs if she continues to have nausea.

## 2012-05-01 ENCOUNTER — Ambulatory Visit (INDEPENDENT_AMBULATORY_CARE_PROVIDER_SITE_OTHER): Payer: Medicaid Other | Admitting: Family Medicine

## 2012-05-01 ENCOUNTER — Encounter: Payer: Self-pay | Admitting: Family Medicine

## 2012-05-01 VITALS — BP 121/79 | HR 103 | Ht 67.0 in | Wt 183.0 lb

## 2012-05-01 DIAGNOSIS — M75101 Unspecified rotator cuff tear or rupture of right shoulder, not specified as traumatic: Secondary | ICD-10-CM

## 2012-05-01 DIAGNOSIS — M67919 Unspecified disorder of synovium and tendon, unspecified shoulder: Secondary | ICD-10-CM

## 2012-05-01 DIAGNOSIS — M719 Bursopathy, unspecified: Secondary | ICD-10-CM

## 2012-05-01 NOTE — Telephone Encounter (Signed)
Pt states she's not having nausea and does not need medication.

## 2012-05-02 ENCOUNTER — Telehealth: Payer: Self-pay | Admitting: *Deleted
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
## 2012-05-02 NOTE — Telephone Encounter (Signed)
Talked with Dr Denton Meek - pt called and c/o of cough about 2 days after starting  Lisinopril. No other symptoms. Dr Denton Meek suggest to stop med till next appt. Stanton Kidney Chrishauna Mee RN 05/02/12 1:30PM

## 2012-05-15 ENCOUNTER — Ambulatory Visit (INDEPENDENT_AMBULATORY_CARE_PROVIDER_SITE_OTHER): Payer: Medicaid Other | Admitting: Internal Medicine

## 2012-05-15 ENCOUNTER — Encounter: Payer: Self-pay | Admitting: Internal Medicine

## 2012-05-15 VITALS — BP 137/92 | HR 84 | Temp 98.3°F | Ht 67.0 in | Wt 184.2 lb

## 2012-05-15 DIAGNOSIS — M75101 Unspecified rotator cuff tear or rupture of right shoulder, not specified as traumatic: Secondary | ICD-10-CM

## 2012-05-15 DIAGNOSIS — Z79899 Other long term (current) drug therapy: Secondary | ICD-10-CM

## 2012-05-15 DIAGNOSIS — E119 Type 2 diabetes mellitus without complications: Secondary | ICD-10-CM

## 2012-05-15 DIAGNOSIS — M67919 Unspecified disorder of synovium and tendon, unspecified shoulder: Secondary | ICD-10-CM

## 2012-05-15 DIAGNOSIS — M545 Low back pain, unspecified: Secondary | ICD-10-CM

## 2012-05-15 DIAGNOSIS — I10 Essential (primary) hypertension: Secondary | ICD-10-CM

## 2012-05-15 LAB — GLUCOSE, CAPILLARY: Glucose-Capillary: 282 mg/dL — ABNORMAL HIGH (ref 70–99)

## 2012-05-15 LAB — POCT GLYCOSYLATED HEMOGLOBIN (HGB A1C): Hemoglobin A1C: 10.2

## 2012-05-15 MED ORDER — TRAMADOL HCL 50 MG PO TABS: 50.0000 mg | ORAL_TABLET | Freq: Two times a day (BID) | ORAL | Status: DC | PRN

## 2012-05-15 NOTE — Assessment & Plan Note (Signed)
She states that walking does exacerbate her back pain which causes her to help you with walk long distances. Did give her a three-month prescription for a handicapped sticker and we'll need to reevaluate need for sticker in 3 months.

## 2012-05-15 NOTE — Assessment & Plan Note (Signed)
Was recently seen by Dr. Scot Dock, and given Mobic prescription. Was then seen in sports medicine clinic where they did steroid injection to right shoulder. She states that this alleviated the pain for some time however it has recurred. We will try her on some tramadol so that she can do the stretching exercises to improve mobility and decrease pain in that joint.

## 2012-05-15 NOTE — Patient Instructions (Addendum)
You were seen today for shoulder pain, and we will prescribe tramadol for you to try. Do not take more than two times per day. Take only 1 pill at a time. We will see you back in 1 month to see if it is helping. Your diabetes is not under good control today and we need to work on it. Take 6 of novolog in the morning 10 with your meal, 8 at night. Increase the lantus at night time to 45 units. We are giving you a food chart to help. Please remember to bring your meter to the next visit.

## 2012-05-15 NOTE — Assessment & Plan Note (Signed)
Blood pressure slightly above goal, 130s over 90s. No change to medication at today's visit as she may not be taking her medication every day. Was recently traveling ACE inhibitor and stopped due to cough. Could possibly trial ARB at next visit.

## 2012-05-15 NOTE — Assessment & Plan Note (Signed)
Patient states that she is supposed to be taking Lantus 40 units at nighttime, 6, 6 and 8 units of NovoLog with meals breakfast lunch and dinner respectively. She states that she'll need one the day to conserve her food supplies at home and therefore her sugars are often high in the evening time. She states she does snack in the evening times due to hunger. I have advised her to change her Lantus to 45 units at nighttime, NovoLog 6 in the morning, 10 units with her meal, 8 units with her snack in the evening time. Did also advise her to bring her meter in for review at next visit and that it might be helpful in determining when her sugars are highest. She denied any hypoglycemic events. We will see her back in one month for close followup.

## 2012-05-15 NOTE — Progress Notes (Signed)
Subjective:     Patient ID: Susan Holmes, female   DOB: 1956/08/06, 56 y.o.   MRN: [id number]  HPI The patient is a 56 year old female who comes in today for a followup visit of some shoulder pain. She is still experiencing some pain in her shoulder and upper back. The pain is exacerbated by walking or stretching her arm upwards. She does have decreased range of movement voluntarily limits this due to pain. She states that the Mobic is not helping with pain however she takes it daily. She was seen at the sports medicine clinic and they did do an injection into her right shoulder. She states that that did help some but has gone away since then. She has not been doing any of the stretching exercises because of the pain. She states that she only one today to conserve her food supplies. She often snacks in the evenings. She would like a food chart for her diabetes at today's visit. States that she occasionally takes her medications for diabetes. No other problems with her medications are needs for refills at today's visit. No other symptoms. No chest pain, shortness of breath, fatigue, chills, fever. She does not bring her meter in with her today, however states that her sugars have been elevated at home.  Review of Systems  Constitutional: Positive for activity change. Negative for fever, chills, appetite change, fatigue and unexpected weight change.  HENT: Negative.   Eyes: Negative.   Respiratory: Negative for cough, chest tightness, shortness of breath, wheezing and stridor.   Cardiovascular: Negative for chest pain, palpitations and leg swelling.  Gastrointestinal: Negative for nausea, vomiting, abdominal pain, diarrhea, constipation and abdominal distention.  Musculoskeletal: Positive for myalgias, back pain and arthralgias. Negative for joint swelling and gait problem.  Skin: Negative for color change, pallor, rash and wound.  Neurological: Negative.  Negative for dizziness.    Psychiatric/Behavioral: Negative.     Vitals: Blood pressure: 137/92 Pulse: 84 Temperature: 98.54F Height: 5 feet 7 inches Weight: 184 pounds Oxygen saturation on room air: 97%    Objective:   Physical Exam  Constitutional: She is oriented to person, place, and time. She appears well-developed and well-nourished.  HENT:  Head: Normocephalic and atraumatic.  Eyes: EOM are normal. Pupils are equal, round, and reactive to light.  Neck: Normal range of motion. Neck supple.  Cardiovascular: Normal rate, regular rhythm, normal heart sounds and intact distal pulses.   Pulmonary/Chest: Effort normal and breath sounds normal.  Abdominal: Soft. Bowel sounds are normal. She exhibits no distension and no mass. There is no tenderness. There is no guarding.  Musculoskeletal: She exhibits tenderness. She exhibits no edema.       Did have limitation of ROM of right shoulder and was walking stiffly. No tingling or numbness and strength testing was voluntarily limited by pain.  Neurological: She is alert and oriented to person, place, and time. No cranial nerve deficit.  Skin: Skin is warm and dry.       Assessment/Plan:   1. Please see problem-oriented charting.  2. Disposition-patient will be seen back in one month to followup on shoulder pain. I also asked her to bring her meter with her so that I can followup on her blood sugars as her hemoglobin A1c was very high today. Have increased her NovoLog to 6 units in the morning 10 units with meal and 8 units at nighttime. Did also increase her Lantus to 45 units at night. She states that she does not always  take her medication and I encouraged her to take it more regularly. Did trial tramadol for her shoulder pain so that she can do more exercises and did give her a three-month handicap parking sticker to be reevaluated in 3 months. We'll see her back in one month and gave her our number in case she has questions or worsening pain. Did advise her to  followup at the sports medicine clinic in 4-6 weeks.

## 2012-05-26 ENCOUNTER — Other Ambulatory Visit: Payer: Self-pay | Admitting: Internal Medicine

## 2012-06-02 ENCOUNTER — Ambulatory Visit (INDEPENDENT_AMBULATORY_CARE_PROVIDER_SITE_OTHER): Payer: Medicaid Other | Admitting: Family Medicine

## 2012-06-02 VITALS — BP 142/92

## 2012-06-02 DIAGNOSIS — M75101 Unspecified rotator cuff tear or rupture of right shoulder, not specified as traumatic: Secondary | ICD-10-CM

## 2012-06-02 DIAGNOSIS — M67919 Unspecified disorder of synovium and tendon, unspecified shoulder: Secondary | ICD-10-CM

## 2012-06-02 MED ORDER — NITROGLYCERIN 0.2 MG/HR TD PT24: MEDICATED_PATCH | TRANSDERMAL | Status: DC

## 2012-06-02 NOTE — Patient Instructions (Signed)

## 2012-06-02 NOTE — Progress Notes (Signed)
Subjective:    Patient ID: Susan Holmes, female    DOB: July 03, 1956, 56 y.o.   MRN: [id number]  HPI  Ms.Slifer is coming today to f/u in her right shoulder pain , she had a subacromial injection on 05/01/12 which did not helped her, she states that her pain is 8/10, in the anterolateral aspect of the shoulder worse with motion. She denies any numbness or tingling, on and off night pain. Her shoulder range of motion has decreased. She states that her pain is about the same.  Patient Active Problem List  Diagnoses  . DM  . HYPERLIPIDEMIA  . ANXIETY  . TOBACCO ABUSE  . DEPRESSION  . CONGENITAL BLINDNESS, LEFT EYE  . HYPERTENSION, ESSENTIAL NOS  . Low back pain  . Skin texture changes  . Fatigue  . Rotator cuff syndrome of right shoulder   Current Outpatient Prescriptions on File Prior to Visit  Medication Sig Dispense Refill  . ACCU-CHEK FASTCLIX LANCETS MISC 1 each by Does not apply route 3 (three) times daily.  102 each  11  . ARIPiprazole (ABILIFY) 2 MG tablet Take 2 mg by mouth daily. From [facility]        . aspirin 81 MG tablet Take 1 tablet (81 mg total) by mouth daily.  30 tablet  12  . fexofenadine (ALLEGRA) 60 MG tablet Take 1 tablet (60 mg total) by mouth daily.  30 tablet  6  . gabapentin (NEURONTIN) 300 MG capsule Take 300 mg by mouth 2 (two) times daily.        Marland Kitchen glucose blood test strip Use as instructed  100 each  12  . hydrochlorothiazide (MICROZIDE) 12.5 MG capsule Take 1 capsule (12.5 mg total) by mouth daily.  30 capsule  5  . insulin aspart (NOVOLOG FLEXPEN) 100 UNIT/ML injection Inject 6 units under the skin 30 min before breakfast and lunch. Inject 8 units under the skin before dinner.  9 mL  12  . insulin glargine (LANTUS SOLOSTAR) 100 UNIT/ML injection Inject 40 Units into the skin at bedtime.  9 mL  12  . Insulin Pen Needle 31G X 6 MM MISC 1 each by Does not apply route 4 (four) times daily.  100 each  11  . meloxicam (MOBIC) 7.5 MG tablet Take 1  tablet (7.5 mg total) by mouth daily.  30 tablet  2  . pravastatin (PRAVACHOL) 40 MG tablet TAKE 1 TABLET BY MOUTH DAILY  30 tablet  5  . promethazine (PHENERGAN) 12.5 MG tablet Take 1 tablet (12.5 mg total) by mouth every 6 (six) hours as needed for nausea.  4 tablet  0  . sertraline (ZOLOFT) 100 MG tablet Take 150 mg by mouth daily.        . traMADol (ULTRAM) 50 MG tablet Take 1 tablet (50 mg total) by mouth 2 (two) times daily as needed for pain.  60 tablet  0   Allergies  Allergen Reactions  . Ace Inhibitors Cough      Review of Systems  Constitutional: Negative for fever, chills, diaphoresis and fatigue.  Musculoskeletal: Negative for back pain, joint swelling, arthralgias and gait problem.  Neurological: Negative for weakness and numbness.       Objective:   Physical Exam  Constitutional: She is oriented to person, place, and time. She appears well-developed and well-nourished.       BP 142/92   Neck: Normal range of motion. Neck supple.  Musculoskeletal:       Right  shoulder with intact skin. No swelling no hematomas.  Decrease ROM flexion 90/11/, abduction 90/110, external rotation 30, Internal rotation 40, extension 45. Hawkins test positive for tear cuff impingement . A.c. joint tenderness to palpation with positive crossover test . Empty can test negative for rotator cuff tear but produces pain. TTP in the bicipital groove Speed test negative for biceps tendinoplasty Strength 5 over 5 with internal and external rotation . Sensation is intact .  Neurological: She is oriented to person, place, and time.  Skin: Skin is warm. No rash noted. No erythema.  Psychiatric: She has a normal mood and affect. Her behavior is normal. Thought content normal.     MSK  U/S r shoulder : Tear of the supraspinatus, partial tear of the biceps. Cortical irregularities in the foot print.        Assessment & Plan:   1. Rotator cuff syndrome of right shoulder    After obtaining  consent, the skin of the anterolateral right shoulder was sterilely prepped with alcohol swabs, ethyl will chloride was used for local topical anesthesia injection of 4 mL 1% lidocaine plus one mL of Kenalog was injected in the right bicipital groove and biceps sheet under U/S guidance. The procedure was well-tolerated by the patient. Patient instructed to remain in clinic for 20 minutes afterwards, and to report any adverse reaction to me immediately.  Nitro protocol 1/4 of a patch a day F/U in 4 weeks

## 2012-06-05 ENCOUNTER — Other Ambulatory Visit: Payer: Self-pay | Admitting: *Deleted

## 2012-06-05 DIAGNOSIS — M75101 Unspecified rotator cuff tear or rupture of right shoulder, not specified as traumatic: Secondary | ICD-10-CM

## 2012-06-05 MED ORDER — NITROGLYCERIN 0.2 MG/HR TD PT24: MEDICATED_PATCH | TRANSDERMAL | Status: DC

## 2012-06-23 ENCOUNTER — Encounter: Payer: Self-pay | Admitting: Internal Medicine

## 2012-06-23 ENCOUNTER — Ambulatory Visit (INDEPENDENT_AMBULATORY_CARE_PROVIDER_SITE_OTHER): Payer: Medicaid Other | Admitting: Internal Medicine

## 2012-06-23 VITALS — BP 137/89 | HR 87 | Temp 98.2°F | Ht 67.0 in | Wt 185.7 lb

## 2012-06-23 DIAGNOSIS — M67919 Unspecified disorder of synovium and tendon, unspecified shoulder: Secondary | ICD-10-CM

## 2012-06-23 DIAGNOSIS — M545 Low back pain, unspecified: Secondary | ICD-10-CM

## 2012-06-23 DIAGNOSIS — I10 Essential (primary) hypertension: Secondary | ICD-10-CM

## 2012-06-23 DIAGNOSIS — M75101 Unspecified rotator cuff tear or rupture of right shoulder, not specified as traumatic: Secondary | ICD-10-CM

## 2012-06-23 DIAGNOSIS — E119 Type 2 diabetes mellitus without complications: Secondary | ICD-10-CM

## 2012-06-23 DIAGNOSIS — E785 Hyperlipidemia, unspecified: Secondary | ICD-10-CM

## 2012-06-23 DIAGNOSIS — M719 Bursopathy, unspecified: Secondary | ICD-10-CM

## 2012-06-23 MED ORDER — GLIPIZIDE 5 MG PO TABS: 5.0000 mg | ORAL_TABLET | Freq: Two times a day (BID) | ORAL | Status: DC

## 2012-06-23 MED ORDER — ASPIRIN 81 MG PO TABS: 81.0000 mg | ORAL_TABLET | Freq: Every day | ORAL | Status: DC

## 2012-06-23 MED ORDER — TRAMADOL HCL 50 MG PO TABS: 50.0000 mg | ORAL_TABLET | Freq: Two times a day (BID) | ORAL | Status: DC | PRN

## 2012-06-23 NOTE — Patient Instructions (Signed)
You were seen today for your diabetes and your back pain. We would like you to continue using the stretching exercises and talk to the sports medicine doctor at your follow up. We are going to add another medicine for your diabetes to help your body respond to insulin better. It is called glipizide and you take it twice a day. We would like you to continue to take your insulin the same way. We would like you to come back in 4 weeks to the clinic to check on your sugars. We have called in some tramadol for your pain. Call us if you have any problems or questions at [phone].  Back Exercises Back exercises help treat and prevent back injuries. The goal is to increase your strength in your belly (abdominal) and back muscles. These exercises can also help with flexibility. Start these exercises when told by your doctor. HOME CARE Back exercises include: Pelvic Tilt.  Lie on your back with your knees bent. Tilt your pelvis until the lower part of your back is against the floor. Hold this position 5 to 10 sec. Repeat this exercise 5 to 10 times.  Knee to Chest.  Pull 1 knee up against your chest and hold for 20 to 30 seconds. Repeat this with the other knee. This may be done with the other leg straight or bent, whichever feels better. Then, pull both knees up against your chest.  Sit-Ups or Curl-Ups.  Bend your knees 90 degrees. Start with tilting your pelvis, and do a partial, slow sit-up. Only lift your upper half 30 to 45 degrees off the floor. Take at least 2 to 3 seonds for each sit-up. Do not do sit-ups with your knees out straight. If partial sit-ups are difficult, simply do the above but with only tightening your belly (abdominal) muscles and holding it as told.  Hip-Lift.  Lie on your back with your knees flexed 90 degrees. Push down with your feet and shoulders as you raise your hips 2 inches off the floor. Hold for 10 seconds, repeat 5 to 10 times.  Back Arches.  Lie on your stomach.  Prop yourself up on bent elbows. Slowly press on your hands, causing an arch in your low back. Repeat 3 to 5 times.  Shoulder-Lifts.  Lie face down with arms beside your body. Keep hips and belly pressed to floor as you slowly lift your head and shoulders off the floor.  Do not overdo your exercises. Be careful in the beginning. Exercises may cause you some mild back discomfort. If the pain lasts for more than 15 minutes, stop the exercises until you see your doctor. Improvement with exercise for back problems is slow.  Document Released: 01/15/2011 Document Revised: 12/02/2011 Document Reviewed: 01/15/2011 [facility] Patient Information 2012 [place], [place].

## 2012-06-27 NOTE — Assessment & Plan Note (Signed)
The patient did have recent injection into her right shoulder which did alleviate a lot of her shoulder pain. Advised her to continue following up with sports medicine for this pain.

## 2012-06-27 NOTE — Progress Notes (Signed)
Subjective:     Patient ID: Susan Holmes, female   DOB: 03/16/1956, 56 y.o.   MRN: [id number]  HPI The patient does come in for followup of her diabetes and also is complaining of back pain at today's visit. She does have chronic back pain which is in her lower back. She is not having any numbness or tingling or weakness in her feet. She is not have any loss of bowel or bladder function. She has no weight loss or fevers at home. She is continuing to use her Lantus 45 units at nighttime. She also uses NovoLog 6 units and 8 units during the day. One of the issues with that is that she only is able to eat one meal per day and therefore takes some insulin at nighttime and some with her meal. She's noticed that her sugars are very high at home lately however she's not sure why. She seems to be snacking slightly less although she does still have a pretty poor diet per her own words. She was having some shoulder pain and was seen in sports medicine recently. They did do an injection into her shoulder joint and this really helped. Have done this in the past and it also helped. She is still smoking and does not wish to quit at this time. Not having any other complaints at today's visit. No hypoglycemic events. She did bring her meter in for review at today's visit and 100% of her sugars were out of range and were too high.  Review of Systems  Constitutional: Negative for fever, activity change, appetite change and unexpected weight change.  Respiratory: Negative for cough, chest tightness and wheezing.   Cardiovascular: Negative for chest pain, palpitations and leg swelling.  Gastrointestinal: Negative for nausea, diarrhea, constipation, blood in stool and abdominal distention.  Musculoskeletal: Positive for back pain. Negative for myalgias, joint swelling, arthralgias and gait problem.  Neurological: Negative for dizziness, tremors, syncope, weakness, light-headedness, numbness and headaches.     Vitals: BP: 137/89  Pulse: 87 Temp 98.2 F (36.8 C) SpO2 97% on room air Weight 185 lb 11.2 oz (84.233 kg)  Height 5\' 7"  (1.702 m)      Objective:   Physical Exam  Constitutional: She is oriented to person, place, and time. She appears well-developed and well-nourished.  HENT:  Head: Normocephalic and atraumatic.  Eyes: EOM are normal. Pupils are equal, round, and reactive to light.  Cardiovascular: Normal rate and regular rhythm.   Pulmonary/Chest: Effort normal and breath sounds normal. No respiratory distress. She exhibits no tenderness.  Abdominal: Soft. Bowel sounds are normal. She exhibits no distension. There is no tenderness.  Musculoskeletal: Normal range of motion.  Neurological: She is alert and oriented to person, place, and time.  Skin: Skin is warm and dry.       Assessment/Plan:   1. Please see problem-oriented charting.  2. Disposition-the patient will be started on glipizide at today's visit to enhance her insulin regimen. He may need to add metformin at next visit. Her hemoglobin A1c is still approximately 11 at today's visit. Have refilled several of her medications at today's visit. She is doing fairly well with her back pain and her blood pressure is under good control today. We will see her back in 4 weeks to follow up on her sugars and make any necessary changes at that time.

## 2012-06-27 NOTE — Assessment & Plan Note (Signed)
The patient is taking pravastatin and will likely need a lipid check at some point in the near future.

## 2012-06-27 NOTE — Assessment & Plan Note (Signed)
The patient's blood pressure is under control at today's visit and will continue hydrochlorothiazide for her blood pressure.

## 2012-06-27 NOTE — Assessment & Plan Note (Signed)
Did advise the patient to talk to sports medicine about her back pain. Did give her prescription for tramadol for her back pain. Did also give her some stretching exercises and encouraged her to do these daily. She is having a red flag symptoms at this time and had workup in the past. Advised her to call our office if this is worsening or gets suddenly severe.

## 2012-06-27 NOTE — Assessment & Plan Note (Signed)
The patient will continue taking 45 units of Lantus daily, NovoLog 6 units with meal and 8 units at nighttime daily. She has not had any hypoglycemic events. We did  add glipizide to her regimen. The plan is to add metformin at next visit is/is not having hypoglycemic events, if she is having a hypoglycemic events the plan will be to stop glipizide and add metformin. She has been on metformin in the past. It is not clear why she was stopped on oral medicines. Her hemoglobin A1c was 10.2 at the end of may. She will come back in one month for recheck and repeat hemoglobin A1c.

## 2012-07-07 ENCOUNTER — Ambulatory Visit (INDEPENDENT_AMBULATORY_CARE_PROVIDER_SITE_OTHER): Payer: Medicaid Other | Admitting: Family Medicine

## 2012-07-07 VITALS — BP 135/95 | Ht 67.0 in | Wt 185.0 lb

## 2012-07-07 DIAGNOSIS — M545 Low back pain, unspecified: Secondary | ICD-10-CM

## 2012-07-07 NOTE — Patient Instructions (Signed)
Physical therapy will be calling you in a few days to set up your treatments. I want them to do an aggressive back strengthening program---your back pain is a result of poor muscle function,chronic inflammation and tightness. If you work hard in physical therapy, I think this will improve over the next 4-6 weeks. The amount of improvement will be directly related to how hard you work in physical therapy.  Great to see you---let me see you back in 6 weeks or so.

## 2012-07-11 ENCOUNTER — Encounter: Payer: Self-pay | Admitting: *Deleted

## 2012-07-11 NOTE — Progress Notes (Signed)
  Subjective:    Patient ID: Susan Holmes, female    DOB: 1956-02-06, 56 y.o.   MRN: [id number]  HPI  Low back pain for 2 years. Worse with extended periods of standing. Has difficulty bending over to pick something up off the floor. No leg weakness. No specific injury. No leg pain. Denies incontinence.  PERTINENT  PMH / PSH: Diabetes mellitus  Review of Systems    Pertinent review of systems: negative for fever or unusual weight change.  Objective:   Physical Exam  Vital signs reviewed. GENERAL: Well developed, well nourished, no acute distress BACK: Nontender to palpation. There is no defect noted on gross exam. Percussion of the thoracic and lumbar vertebra is painless. She can flex at the hips only about 45. She can hyperextend at the hips 5. Truncal rotation is limited 10-15 bilaterally. Lower extremity strength 5 out of 5 hip flexors and hip extensors. DTRs 2+ bilaterally equal at the knee.      Assessment & Plan:  Muscle skeletal low back pain. We'll schedule physical therapy and see her back in 6 weeks.

## 2012-07-11 NOTE — Progress Notes (Signed)
I received a fax from [facility] with labs they had done on your patient on 7/8. A1C was 12.4 and CBC components abnormal.  Not much change from her last labs in clinic. I placed the labs in your box to review. Pt has a scheduled appointment with you on 8/6.   Please review and let me know if waiting until 8/6 is okay.

## 2012-07-11 NOTE — Progress Notes (Signed)
Waiting until 8/6 is fine, although if you could call her and see if she is taking her glipizide, lantus, and novolog would be helpful.   Thanks,   Dr. Dorise Hiss

## 2012-07-12 NOTE — Progress Notes (Signed)
Pt was called and she states she is taking all her diabetic medications. She is aware A1C is elevated

## 2012-07-18 ENCOUNTER — Ambulatory Visit: Payer: Medicaid Other | Attending: Family Medicine | Admitting: Physical Therapy

## 2012-07-18 DIAGNOSIS — M545 Low back pain, unspecified: Secondary | ICD-10-CM | POA: Insufficient documentation

## 2012-07-18 DIAGNOSIS — IMO0001 Reserved for inherently not codable concepts without codable children: Secondary | ICD-10-CM | POA: Insufficient documentation

## 2012-07-18 DIAGNOSIS — M2569 Stiffness of other specified joint, not elsewhere classified: Secondary | ICD-10-CM | POA: Insufficient documentation

## 2012-07-21 ENCOUNTER — Other Ambulatory Visit: Payer: Self-pay | Admitting: *Deleted

## 2012-07-21 DIAGNOSIS — E119 Type 2 diabetes mellitus without complications: Secondary | ICD-10-CM

## 2012-07-21 MED ORDER — GLUCOSE BLOOD VI STRP: ORAL_STRIP | Status: DC

## 2012-07-21 NOTE — Telephone Encounter (Signed)
Meter ia One Touch mini - test twice a day. Code Dx 250.00

## 2012-07-21 NOTE — Telephone Encounter (Signed)
Have rxed the easy touch test strips. Cannot find specific ones for one touch mini. If these are incorrect strips, please have Lupita Leash Plyler help you find the correct one and rx that to me.  Thanks,   Dr. Dorise Hiss

## 2012-07-25 ENCOUNTER — Ambulatory Visit: Payer: Medicaid Other | Admitting: Physical Therapy

## 2012-07-29 ENCOUNTER — Telehealth: Payer: Self-pay | Admitting: Internal Medicine

## 2012-07-29 DIAGNOSIS — J40 Bronchitis, not specified as acute or chronic: Secondary | ICD-10-CM

## 2012-07-29 MED ORDER — AZITHROMYCIN 250 MG PO TABS: ORAL_TABLET | ORAL | Status: AC

## 2012-07-29 NOTE — Telephone Encounter (Signed)
Patient called complaining of fevers with Tmax of 103 , productive cough where she is bringing up yellowish phlegm, congestion, malaise  and SOB  that started 2 days ago . This could be be a presentation consistent with bronchitis/ COPD excacerberation  but PNA could not be ruled out . Patient was advised to come to the ER to get evaluated( get a CXR)  but she refused. She states that she hates to wait in the ER.  She was requesting for  z- pak but when I insisted again to come to the ER, she hung up on me.    A/P : It would have been been more appropriate for patient to be examined before prescribing her the treatment but given her symptoms , I would treat for bacterial infection with a short course of antibiotics.  -Would treat her with Z- pak  - She was again emphasized to come to the ER if she does not get better or come to the clinic on Monday.

## 2012-08-01 ENCOUNTER — Encounter: Payer: Self-pay | Admitting: Internal Medicine

## 2012-08-01 ENCOUNTER — Ambulatory Visit (INDEPENDENT_AMBULATORY_CARE_PROVIDER_SITE_OTHER): Payer: Medicaid Other | Admitting: Internal Medicine

## 2012-08-01 VITALS — BP 140/70 | HR 70 | Temp 98.2°F | Ht 67.0 in | Wt 195.7 lb

## 2012-08-01 DIAGNOSIS — M19019 Primary osteoarthritis, unspecified shoulder: Secondary | ICD-10-CM

## 2012-08-01 DIAGNOSIS — Z79899 Other long term (current) drug therapy: Secondary | ICD-10-CM

## 2012-08-01 DIAGNOSIS — E785 Hyperlipidemia, unspecified: Secondary | ICD-10-CM

## 2012-08-01 DIAGNOSIS — M545 Low back pain, unspecified: Secondary | ICD-10-CM

## 2012-08-01 DIAGNOSIS — E119 Type 2 diabetes mellitus without complications: Secondary | ICD-10-CM

## 2012-08-01 DIAGNOSIS — I10 Essential (primary) hypertension: Secondary | ICD-10-CM

## 2012-08-01 DIAGNOSIS — F329 Major depressive disorder, single episode, unspecified: Secondary | ICD-10-CM

## 2012-08-01 DIAGNOSIS — M75101 Unspecified rotator cuff tear or rupture of right shoulder, not specified as traumatic: Secondary | ICD-10-CM

## 2012-08-01 DIAGNOSIS — F411 Generalized anxiety disorder: Secondary | ICD-10-CM

## 2012-08-01 LAB — GLUCOSE, CAPILLARY: Glucose-Capillary: 211 mg/dL — ABNORMAL HIGH (ref 70–99)

## 2012-08-01 LAB — POCT GLYCOSYLATED HEMOGLOBIN (HGB A1C): Hemoglobin A1C: 12

## 2012-08-01 MED ORDER — TRAMADOL HCL 50 MG PO TABS: 50.0000 mg | ORAL_TABLET | Freq: Two times a day (BID) | ORAL | Status: DC | PRN

## 2012-08-01 MED ORDER — INSULIN ASPART 100 UNIT/ML ~~LOC~~ SOLN: SUBCUTANEOUS | Status: DC

## 2012-08-01 MED ORDER — DM-GUAIFENESIN ER 30-600 MG PO TB12: 1.0000 | ORAL_TABLET | Freq: Two times a day (BID) | ORAL | Status: AC

## 2012-08-01 MED ORDER — SIMETHICONE 80 MG PO CHEW: 80.0000 mg | CHEWABLE_TABLET | Freq: Four times a day (QID) | ORAL | Status: DC | PRN

## 2012-08-01 MED ORDER — MELOXICAM 7.5 MG PO TABS: 7.5000 mg | ORAL_TABLET | Freq: Every day | ORAL | Status: DC

## 2012-08-01 MED ORDER — PRAVASTATIN SODIUM 40 MG PO TABS: 40.0000 mg | ORAL_TABLET | Freq: Every day | ORAL | Status: DC

## 2012-08-01 MED ORDER — DOCUSATE SODIUM 100 MG PO CAPS: 100.0000 mg | ORAL_CAPSULE | Freq: Two times a day (BID) | ORAL | Status: DC

## 2012-08-01 MED ORDER — INSULIN GLARGINE 100 UNIT/ML ~~LOC~~ SOLN: 50.0000 [IU] | Freq: Every day | SUBCUTANEOUS | Status: DC

## 2012-08-01 MED ORDER — GABAPENTIN 300 MG PO CAPS: 300.0000 mg | ORAL_CAPSULE | Freq: Two times a day (BID) | ORAL | Status: DC

## 2012-08-01 MED ORDER — METFORMIN HCL 500 MG PO TABS: 500.0000 mg | ORAL_TABLET | Freq: Two times a day (BID) | ORAL | Status: DC

## 2012-08-01 NOTE — Assessment & Plan Note (Signed)
Patient does continue to take statin and will continue.

## 2012-08-01 NOTE — Progress Notes (Signed)
Subjective:     Patient ID: Susan Holmes, female   DOB: [birth year], 56 y.o.   MRN: [id number]  HPI The patient is a 56 year old female who comes in today with chief complaint of cough and for followup visit. She is past medical history of diabetes, hyperlipidemia, tobacco abuse, depression, hypertension. She also chronically has some low back pain without any red flag symptoms which is controlled by tramadol. She states that she has been continuing to take her insulin and takes 50 units of Lantus at nighttime, 8 units of NovoLog with breakfast and lunch and 15 units of NovoLog at meals. She also continues to take her oral medications as prescribed. She has not had any hypoglycemic events and review of her meter suggests that most of her blood sugars are in the 200-350 range. Since last Friday she has been having some cold symptoms. She was given Z-Pak verbally over the phone for over this weekend. She states she still has 1 day to complete that. I did recommend that she complete that. She is still having a little but of cough and would like cough medicine at today's visit. She is also having some gas and would like some gas medication and stool softener at today's visit as well. No chest pain, shortness of breath, nausea, vomiting. She is feeling a little bit weak and is still having a little bit of drainage. No fevers or chills at home. Not coughing up anything.  Review of Systems  Constitutional: Negative for fever, chills, diaphoresis, activity change, fatigue and unexpected weight change.  HENT: Positive for congestion and rhinorrhea. Negative for sore throat, drooling, mouth sores, trouble swallowing, dental problem and voice change.   Respiratory: Positive for cough. Negative for choking, chest tightness, shortness of breath, wheezing and stridor.   Cardiovascular: Negative for chest pain, palpitations and leg swelling.  Gastrointestinal: Negative for nausea, vomiting, abdominal pain, diarrhea,  constipation and abdominal distention.  Musculoskeletal: Positive for back pain. Negative for myalgias, joint swelling, arthralgias and gait problem.  Neurological: Negative for dizziness, tremors, seizures, syncope, speech difficulty, weakness, light-headedness, numbness and headaches.       Objective:   Physical Exam  Constitutional: She appears well-developed and well-nourished.       Obese  HENT:  Head: Normocephalic and atraumatic.  Eyes: EOM are normal. Pupils are equal, round, and reactive to light.  Neck: Normal range of motion. Neck supple.  Cardiovascular: Normal rate and regular rhythm.   Pulmonary/Chest: Effort normal and breath sounds normal.  Abdominal: Soft. Bowel sounds are normal.  Musculoskeletal: Normal range of motion.  Skin: Skin is warm and dry.       Assessment/Plan:   1. Please see problem-oriented charting.  2. Disposition-the patient be seen back in 2 months for followup of her diabetes. She was added prescription for metformin at today's visit to better control her diabetes. She states that she'll continue her medications and call the office if she has any problems with the metformin. She is also prescribed Mylicon for gas and Mucinex-DM for her cough and cold a one-week prescription.

## 2012-08-01 NOTE — Assessment & Plan Note (Signed)
The patient is currently taking glipizide, Lantus, NovoLog. We will add metformin at today's visit for better control. Her hemoglobin A1c at today's visit is 12. We'll see her back in 2 months for close followup.

## 2012-08-01 NOTE — Patient Instructions (Addendum)
You were seen today for a follow up. We will give you a medicine for your cough and give it a week or so to go away. We will give you a stool softener which you can use up to two times per day if needed. We are going to add metformin to you for your sugars. Take 1 pill in the morning for 1 week, then take 1 pill in the morning and 1 pill with dinner after that. We will see you back in 3 months or sooner if you need. Our number is [phone].

## 2012-08-01 NOTE — Assessment & Plan Note (Signed)
Blood pressure fairly well-controlled at 140/70 at today's visit.

## 2012-08-03 ENCOUNTER — Telehealth: Payer: Self-pay | Admitting: *Deleted

## 2012-08-03 NOTE — Telephone Encounter (Signed)
No, she was to finish the one at home.   Thanks,   Dr. Dorise Hiss

## 2012-08-03 NOTE — Telephone Encounter (Signed)
Pt calls and ask if you were going to send a Zpack to her pharm? [facility]- [address] and [place]

## 2012-08-04 NOTE — Telephone Encounter (Signed)
She was given two week supply at visit and this should not be gone yet. No more medication will be sent in at this time.  Thanks,  Dr. Dorise Hiss

## 2012-08-04 NOTE — Telephone Encounter (Signed)
i called and spoke w/ pt 8/8, she stated she would like some cough medicine called in, please advise. She states she has finished the zpack and she understands she does not need to take more

## 2012-08-10 ENCOUNTER — Telehealth: Payer: Self-pay | Admitting: *Deleted

## 2012-08-10 NOTE — Telephone Encounter (Signed)
Return pt's call - states Mucinex DM is an OTC medication and too expensive.  States she still has a cold and coughing; wants to know if you can  Send something else to her pharmacy.

## 2012-08-10 NOTE — Telephone Encounter (Signed)
Most of the cough medicines are going to be expensive and the cough will likely last several weeks at least. These medicines are not going to change that. Advise her to take her allegra daily if she is not. She can try one of the OTC cheaper cough medicines.

## 2012-08-10 NOTE — Telephone Encounter (Signed)
Pt was called and instructed per Dr Dorise Hiss; stated she has tried Robitussin, Tylenol Cold, daytime, nighttime med.but Continues to have "chest congestion".  Also instructed if she does not feel better by next week to call the clinic.  She agreed.

## 2012-08-10 NOTE — Telephone Encounter (Signed)
Sounds like a plan, thanks.

## 2012-08-18 ENCOUNTER — Ambulatory Visit (INDEPENDENT_AMBULATORY_CARE_PROVIDER_SITE_OTHER): Payer: Medicaid Other | Admitting: Family Medicine

## 2012-08-18 VITALS — BP 125/86 | Ht 67.0 in | Wt 190.0 lb

## 2012-08-18 DIAGNOSIS — M545 Low back pain, unspecified: Secondary | ICD-10-CM

## 2012-08-18 NOTE — Patient Instructions (Addendum)
Do your exercises.  3 sets of 25 at least twice per day.    Return in 4 weeks.

## 2012-08-18 NOTE — Progress Notes (Signed)
  Subjective:    Patient ID: Susan Holmes, female    DOB: 1956-05-29, 56 y.o.   MRN: [id number]  HPI She returns for followup of low back pain. She went to physical therapy twice. She does not feel she has had any improvement. Pain is essentially unchanged. Pain is 8 out of 10 most of the time. No new symptoms such as incontinence or radicular leg pain. No leg weakness.   Review of Systems Denies fever, sweats, chills. Please see history of present illness above for additional partner review of systems.    Objective:   Physical Exam  GENERAL: Well-developed female no acute distress Vital signs reviewed BACK: No defect noted. Lumbosacral vertebra are nontender to percussion. Some tightness in the lumbosacral muscles and she can only flex at the hips about 70 before she has to bend her knees secondary to muscle tension. Straight leg raise is negative bilaterally. Lower stream he strength is 5 out of 5 bilaterally symmetrical in hip flexors and extensors. DTRs 2+ bilaterally at knee.      Assessment & Plan:

## 2012-08-18 NOTE — Assessment & Plan Note (Signed)
Functional low back pain 2 to week core muscles, some facet arthropathy is likely also contributing. She did not get much from formal physical therapy. We sent her home today with the home exercise back program which we gave to her in handout form and explained in detail. I think the only she's going to improve is with an exercise program. She seems a little frustrated that there is none easier answer. I'll see her back in 4 weeks.

## 2012-08-21 ENCOUNTER — Other Ambulatory Visit: Payer: Self-pay | Admitting: Internal Medicine

## 2012-08-21 DIAGNOSIS — Z1231 Encounter for screening mammogram for malignant neoplasm of breast: Secondary | ICD-10-CM

## 2012-09-08 ENCOUNTER — Ambulatory Visit (INDEPENDENT_AMBULATORY_CARE_PROVIDER_SITE_OTHER): Payer: Medicaid Other | Admitting: Family Medicine

## 2012-09-08 VITALS — BP 130/87 | Ht 67.0 in | Wt 200.0 lb

## 2012-09-08 DIAGNOSIS — M545 Low back pain, unspecified: Secondary | ICD-10-CM

## 2012-09-08 MED ORDER — CYCLOBENZAPRINE HCL 5 MG PO TABS: 5.0000 mg | ORAL_TABLET | Freq: Every evening | ORAL | Status: AC | PRN

## 2012-09-08 NOTE — Assessment & Plan Note (Signed)
Today we did trigger point injections in the right lumbar area. I again stressed to her that the only way to fix her back pain is to do her home exercise program. At the nurse go over these again and also add the Gen. hip program. We can see her back when necessary.

## 2012-09-08 NOTE — Progress Notes (Signed)
  Subjective:    Patient ID: KAREM FARHA, female    DOB: 1956-06-09, 56 y.o.   MRN: [id number]  HPI Followup low back pain which is mostly on the right side. She was trying to do the exercises and managed given for 2 days only. Today increased her pain. She is frustrated. She does have plans to join a gym with her daughter and has questions about that. No new symptoms specifically no incontinence or lower stream any weakness or numbness.   Review of Systems See hpi     Objective:   Physical Exam  Vital signs reviewed. GENERAL: Well developed, well nourished, no acute distress BACK: Tender to palpation right lumbar area and there is some muscle spasm noted there. POSTURE: Slightly forward bent posture. LOWER extremity strength 5 out of 5 symmetrical equal. Straight leg raise negative bilaterally INJECTION: Patient was given informed consent, signed copy in the chart. Appropriate time out was taken. Area prepped and draped in usual sterile fashion. 1 cc of methylprednisolone 40 mg/ml plus  3 cc of 1% lidocaine without epinephrine was injected into the trigger point area right lumbar musculature using a(n) perpendicular approach. The patient tolerated the procedure well. There were no complications. Post procedure instructions were given.      Assessment & Plan:

## 2012-09-08 NOTE — Patient Instructions (Signed)
Thank you for coming in today. Please do your home exercises. They may make your pain worse initially but in the long run this is what will help your pain get better. Take muscle relaxer at night for spasm as needed. Trigger point injection today. Followup in one month

## 2012-09-15 ENCOUNTER — Ambulatory Visit
Admission: RE | Admit: 2012-09-15 | Discharge: 2012-09-15 | Disposition: A | Discharge status: Home / Self Care | Payer: Medicaid Other | Source: Ambulatory Visit | Attending: Internal Medicine | Admitting: Internal Medicine

## 2012-09-15 DIAGNOSIS — Z1231 Encounter for screening mammogram for malignant neoplasm of breast: Secondary | ICD-10-CM

## 2012-09-20 ENCOUNTER — Other Ambulatory Visit: Payer: Self-pay | Admitting: Internal Medicine

## 2012-09-20 DIAGNOSIS — R928 Other abnormal and inconclusive findings on diagnostic imaging of breast: Secondary | ICD-10-CM

## 2012-09-23 ENCOUNTER — Encounter (HOSPITAL_COMMUNITY): Payer: Self-pay | Admitting: Emergency Medicine

## 2012-09-23 ENCOUNTER — Emergency Department (HOSPITAL_COMMUNITY)
Admission: EM | Admit: 2012-09-23 | Discharge: 2012-09-23 | Disposition: A | Discharge status: Home / Self Care | Payer: Medicaid Other | Attending: Emergency Medicine | Admitting: Emergency Medicine

## 2012-09-23 ENCOUNTER — Emergency Department (HOSPITAL_COMMUNITY): Payer: Medicaid Other

## 2012-09-23 DIAGNOSIS — Z79899 Other long term (current) drug therapy: Secondary | ICD-10-CM | POA: Insufficient documentation

## 2012-09-23 DIAGNOSIS — E119 Type 2 diabetes mellitus without complications: Secondary | ICD-10-CM | POA: Insufficient documentation

## 2012-09-23 DIAGNOSIS — Z7982 Long term (current) use of aspirin: Secondary | ICD-10-CM | POA: Insufficient documentation

## 2012-09-23 DIAGNOSIS — I10 Essential (primary) hypertension: Secondary | ICD-10-CM | POA: Insufficient documentation

## 2012-09-23 DIAGNOSIS — J029 Acute pharyngitis, unspecified: Secondary | ICD-10-CM | POA: Insufficient documentation

## 2012-09-23 DIAGNOSIS — R05 Cough: Secondary | ICD-10-CM | POA: Insufficient documentation

## 2012-09-23 DIAGNOSIS — R059 Cough, unspecified: Secondary | ICD-10-CM | POA: Insufficient documentation

## 2012-09-23 DIAGNOSIS — Z794 Long term (current) use of insulin: Secondary | ICD-10-CM | POA: Insufficient documentation

## 2012-09-23 LAB — RAPID STREP SCREEN (MED CTR MEBANE ONLY): Streptococcus, Group A Screen (Direct): NEGATIVE

## 2012-09-23 MED ORDER — HYDROCODONE-ACETAMINOPHEN 7.5-500 MG/15ML PO SOLN: 15.0000 mL | Freq: Four times a day (QID) | ORAL | Status: DC | PRN

## 2012-09-23 NOTE — ED Provider Notes (Signed)
History     CSN: [id number]  Arrival date & time 09/23/12  1191   First MD Initiated Contact with Patient 09/23/12 1938      Chief Complaint  Patient presents with  . Sore Throat  . Cough    (Consider location/radiation/quality/duration/timing/severity/associated sxs/prior treatment) HPI Comments: Patient comes in today with a chief complaint of sore throat and productive cough for the past 5 days.  She states that she is coughing up yellowish colored phlegm.  Symptoms gradually worsening.  She has also been having chills and thinks that she has had a fever, although she has not taken her temperature.  She has not taken anything for her symptoms.  She denies chest pain or SOB.    Patient is a 56 y.o. female presenting with cough. The history is provided by the patient.  Cough The cough is productive of sputum. Associated symptoms include chills, rhinorrhea and sore throat. Pertinent negatives include no chest pain, no ear congestion, no ear pain, no shortness of breath and no wheezing. She has tried nothing for the symptoms. She is a smoker.    Past Medical History  Diagnosis Date  . Hepatitis B infection pt unsure    resolved  . Diabetes mellitus, type II   . HTN (hypertension)   . Hyperlipidemia   . Congenital blindness     L eye  . Tobacco abuse     .5 PPD smoker  . Anxiety   . Depression     Past Surgical History  Procedure Date  . Abdominal hysterectomy     Family History  Problem Relation Age of Onset  . Heart disease Mother   . Hypertension Mother   . Heart disease Father   . Aneurysm Brother   . Aneurysm Sister   . Colon cancer Neg Hx   . Esophageal cancer Neg Hx   . Stomach cancer Neg Hx     History  Substance Use Topics  . Smoking status: Current Every Day Smoker -- 0.5 packs/day    Types: Cigarettes  . Smokeless tobacco: Current User    Types: Chew   Comment: did not want to discuss any stopping or decreasing  . Alcohol Use: No    OB History     Grav Para Term Preterm Abortions TAB SAB Ect Mult Living                  Review of Systems  Constitutional: Positive for fever and chills.  HENT: Positive for congestion, sore throat and rhinorrhea. Negative for ear pain, trouble swallowing, neck pain, neck stiffness, voice change and sinus pressure.   Respiratory: Positive for cough. Negative for chest tightness, shortness of breath and wheezing.   Cardiovascular: Negative for chest pain.  Gastrointestinal: Negative for nausea and vomiting.  Skin: Negative for rash.    Allergies  Ace inhibitors  Home Medications   Current Outpatient Rx  Name Route Sig Dispense Refill  . ASPIRIN EC 81 MG PO TBEC Oral Take 81 mg by mouth every morning.    Marland Kitchen GABAPENTIN 300 MG PO CAPS Oral Take 1 capsule (300 mg total) by mouth 2 (two) times daily. 60 capsule 6  . GLIPIZIDE 5 MG PO TABS Oral Take 1 tablet (5 mg total) by mouth 2 (two) times daily. 60 tablet 11  . GLUCOSE BLOOD VI STRP  Use to test sugars twice daily. Dx Code 250.00 100 each 12  . GLUCOSE BLOOD VI STRP  Use as instructed 100 each 12  accu-check strips  . HYDROCHLOROTHIAZIDE 12.5 MG PO CAPS Oral Take 1 capsule (12.5 mg total) by mouth daily. 30 capsule 5  . INSULIN ASPART 100 UNIT/ML [place] SOLN Subcutaneous Inject 8-15 Units into the skin 3 (three) times daily before meals. Inject 8 units under the skin 30 min before breakfast and lunch. Inject 15 units under the skin before dinner. Dx code 250.00    . INSULIN GLARGINE 100 UNIT/ML [place] SOLN Subcutaneous Inject 50 Units into the skin at bedtime. 10 mL 12  . INSULIN PEN NEEDLE 31G X 6 MM MISC Does not apply 1 each by Does not apply route 4 (four) times daily. 100 each 11  . METFORMIN HCL 500 MG PO TABS Oral Take 1 tablet (500 mg total) by mouth 2 (two) times daily with a meal. 60 tablet 11  . NITROGLYCERIN 0.2 MG/HR TD PT24 Transdermal Place 1 patch onto the skin daily as needed. Nitroglycerin Protocol  Apply 1/4 nitroglycerin patch to  affected area daily. Change position of patch within the affected area every 24 hours. You may experience a headache during the first 1-2 weeks of using the patch, these should subside.    Marland Kitchen PRAVASTATIN SODIUM 40 MG PO TABS Oral Take 1 tablet (40 mg total) by mouth daily. 30 tablet 5  . PROMETHAZINE HCL 12.5 MG PO TABS Oral Take 12.5 mg by mouth every 6 (six) hours as needed. For nausea    . SERTRALINE HCL 100 MG PO TABS Oral Take 150 mg by mouth daily.      Marland Kitchen SIMETHICONE 80 MG PO CHEW Oral Chew 1 tablet (80 mg total) by mouth 4 (four) times daily as needed for flatulence. 100 tablet 2  . TRAMADOL HCL 50 MG PO TABS Oral Take 1 tablet (50 mg total) by mouth 2 (two) times daily as needed for pain. 60 tablet 0  . ACCU-CHEK FASTCLIX LANCETS MISC Does not apply 1 each by Does not apply route 3 (three) times daily. 102 each 11    BP 137/82  Pulse 93  Temp 99.5 F (37.5 C) (Oral)  Resp 20  SpO2 96%  Physical Exam  Nursing note and vitals reviewed. Constitutional: She appears well-developed and well-nourished. No distress.  HENT:  Head: Normocephalic and atraumatic.  Right Ear: Hearing, tympanic membrane and ear canal normal.  Left Ear: Hearing, tympanic membrane and ear canal normal.  Nose: Nose normal. Right sinus exhibits no maxillary sinus tenderness and no frontal sinus tenderness. Left sinus exhibits no maxillary sinus tenderness and no frontal sinus tenderness.  Mouth/Throat: Mucous membranes are normal. Posterior oropharyngeal erythema present. No oropharyngeal exudate, posterior oropharyngeal edema or tonsillar abscesses.  Cardiovascular: Normal rate, regular rhythm and normal heart sounds.   Pulmonary/Chest: Breath sounds normal. No respiratory distress. She has no wheezes.  Neurological: She is alert.  Skin: Skin is warm and dry. She is not diaphoretic.  Psychiatric: She has a normal mood and affect.    ED Course  Procedures (including critical care time)   Labs Reviewed    RAPID STREP SCREEN   Dg Chest 2 View  09/23/2012  *RADIOLOGY REPORT*  Clinical Data: Sore throat for 7 days.  Smoker.  Low grade fever.  CHEST - 2 VIEW  Comparison: 06/03/2011  Findings: The lungs are clear.  Heart and mediastinal contours are within normal limits.  No bony abnormality is present.  There is no effusion or pneumothorax.  IMPRESSION: Stable appearance from priors.  No active disease.   Original Report Authenticated  By: Elsie Stain, M.D.      No diagnosis found.    MDM  Patient presenting with low grade fever, cough, and sore throat.  Rapid strep negative.  CXR negative.  Therefore, suspect viral illness.  Patient discharged home and instructed to follow up with PCP.  Return precautions discussed.        Pascal Lux [place], PA-C 09/24/12 2321

## 2012-09-23 NOTE — ED Notes (Signed)
Pt reports sore throat and cough that began on Monday. Pt denies shortness of breath or difficulty breathing. Pt reports a productive cough with clear, thick secretions.

## 2012-09-26 ENCOUNTER — Ambulatory Visit
Admission: RE | Admit: 2012-09-26 | Discharge: 2012-09-26 | Disposition: A | Discharge status: Home / Self Care | Payer: Medicaid Other | Source: Ambulatory Visit | Attending: Internal Medicine | Admitting: Internal Medicine

## 2012-09-26 DIAGNOSIS — R928 Other abnormal and inconclusive findings on diagnostic imaging of breast: Secondary | ICD-10-CM

## 2012-09-26 NOTE — ED Provider Notes (Signed)
Medical screening examination/treatment/procedure(s) were performed by non-physician practitioner and as supervising physician I was immediately available for consultation/collaboration.    Velma Hanna L Amanii Snet[place], MD 09/26/12 1949 

## 2012-09-29 ENCOUNTER — Ambulatory Visit (INDEPENDENT_AMBULATORY_CARE_PROVIDER_SITE_OTHER): Payer: Medicaid Other | Admitting: Internal Medicine

## 2012-09-29 ENCOUNTER — Encounter: Payer: Self-pay | Admitting: Internal Medicine

## 2012-09-29 VITALS — BP 122/84 | HR 85 | Temp 98.5°F | Ht 67.0 in | Wt 205.7 lb

## 2012-09-29 DIAGNOSIS — I10 Essential (primary) hypertension: Secondary | ICD-10-CM

## 2012-09-29 DIAGNOSIS — M545 Low back pain, unspecified: Secondary | ICD-10-CM

## 2012-09-29 DIAGNOSIS — E119 Type 2 diabetes mellitus without complications: Secondary | ICD-10-CM

## 2012-09-29 MED ORDER — METFORMIN HCL 1000 MG PO TABS: 1000.0000 mg | ORAL_TABLET | Freq: Two times a day (BID) | ORAL | Status: DC

## 2012-09-29 MED ORDER — MELOXICAM 7.5 MG PO TABS: 7.5000 mg | ORAL_TABLET | Freq: Every day | ORAL | Status: DC

## 2012-09-29 NOTE — Progress Notes (Addendum)
Subjective:     Patient ID: Susan Holmes, female   DOB: [birth year], 56 y.o.   MRN: [id number]  HPI The patient is a 56 YO female who comes into the clinic for a follow up visit of her diabetes, hypertension, low back pain. She is still having some back pain and is unable to walk more than 5 minutes without having to rest. She states that mostly she is not moving around. She is requesting handicapped sticker for her car at this visit. She is also having some cough at this visit which has not alleviated from cold about 1 month ago. She continues to take her medications and is slightly constipated. She has gained weight since last visit. She doesn't think she is eating more but is not exercising at all. She had injection in her back at sports medicine which helped some but is wearing off. She was offered flu shot and pneumonia shot at today's visit which she declined. Sugars have still been high at home and no hypoglycemic episodes. She tripped getting out of bed this morning and is having right wrist pain. Still able to move hand around. Fall onto outstretched hand (possibly, the patient was very hard to pin down what happened stating she did not remember how she fell).  Review of Systems  Constitutional: Negative for fever, chills, diaphoresis, activity change, appetite change, fatigue and unexpected weight change.  HENT: Negative.   Eyes: Negative.   Respiratory: Negative for cough, choking, chest tightness, shortness of breath, wheezing and stridor.   Cardiovascular: Negative for chest pain, palpitations and leg swelling.  Gastrointestinal: Positive for constipation. Negative for nausea, vomiting, abdominal pain, diarrhea and abdominal distention.  Genitourinary: Negative.   Musculoskeletal: Positive for back pain. Negative for myalgias, joint swelling, arthralgias and gait problem.  Skin: Negative.   Neurological: Negative.   Hematological: Negative.   Psychiatric/Behavioral: Negative.    Kind of flat affect       Objective:   Physical Exam  Constitutional: She is oriented to person, place, and time. She appears well-developed and well-nourished. No distress.  HENT:  Head: Normocephalic and atraumatic.  Eyes: EOM are normal. Pupils are equal, round, and reactive to light.  Neck: Normal range of motion. Neck supple.  Cardiovascular: Normal rate and regular rhythm.   Pulmonary/Chest: Effort normal and breath sounds normal. No respiratory distress. She has no wheezes. She has no rales.  Abdominal: Soft. Bowel sounds are normal. She exhibits no distension and no mass. There is no tenderness. There is no rebound and no guarding.  Musculoskeletal:       The patient's right wrist is intact, with full range of motion. No swelling. No snuff box tenderness. Able to move all fingers without pain.  Neurological: She is alert and oriented to person, place, and time. No cranial nerve deficit.  Skin: Skin is warm and dry. She is not diaphoretic.       Assessment/Plan:   1. Please see problem oriented charting.   2. Disposition - The patient will be seen back in 1 month for close follow up. She declined flu shot and pneumonia shot at today's visit. She is currently undergoing work up for breast mass and will require US guided biopsy of the area in her right breast. Increase metformin to 1000 mg BID and use allergy medication for cough. Will use meloxicam and tramadol for back pain. Will not give handicapped parking sticker for her. Advised if her wrist continues to hurt on Monday that she  should call and we would arrange x-ray. Do not feel that it is broken at this time.

## 2012-09-29 NOTE — Patient Instructions (Addendum)
We would like you to work on losing some weight. We will increase your metformin to 1000 mg twice a day. For the first three days take 2 of the 500 mg pills in the morning and 1 of the 500 mg pills at night. Then take 2 of the 500 mg pills at morning and at night until they are gone. Then for your new prescription get the 1000 mg pills. Take 1 of that in the morning and 1 at night until you come back. We will have you get an allergy medicine over the counter for your cough. It may be several weeks before you start to see some benefit. We will try a new medicine for your back pain. Take 1 pill per day and you can still use tramadol for extra pain.    Exercise to Lose Weight Exercise and a healthy diet may help you lose weight. Your doctor may suggest specific exercises. EXERCISE IDEAS AND TIPS  Choose low-cost things you enjoy doing, such as walking, bicycling, or exercising to workout videos.  Take stairs instead of the elevator.  Walk during your lunch break.  Park your car further away from work or school.  Go to a gym or an exercise class.  Start with 5 to 10 minutes of exercise each day. Build up to 30 minutes of exercise 4 to 6 days a week.  Wear shoes with good support and comfortable clothes.  Stretch before and after working out.  Work out until you breathe harder and your heart beats faster.  Drink extra water when you exercise.  Do not do so much that you hurt yourself, feel dizzy, or get very short of breath. Exercises that burn about 150 calories:  Running 1  miles in 15 minutes.  Playing volleyball for 45 to 60 minutes.  Washing and waxing a car for 45 to 60 minutes.  Playing touch football for 45 minutes.  Walking 1  miles in 35 minutes.  Pushing a stroller 1  miles in 30 minutes.  Playing basketball for 30 minutes.  Raking leaves for 30 minutes.  Bicycling 5 miles in 30 minutes.  Walking 2 miles in 30 minutes.  Dancing for 30 minutes.  Shoveling  snow for 15 minutes.  Swimming laps for 20 minutes.  Walking up stairs for 15 minutes.  Bicycling 4 miles in 15 minutes.  Gardening for 30 to 45 minutes.  Jumping rope for 15 minutes.  Washing windows or floors for 45 to 60 minutes. Document Released: 01/15/2011 Document Revised: 03/06/2012 Document Reviewed: 01/15/2011 [facility] Patient Information 2013 [place], [place].

## 2012-09-29 NOTE — Assessment & Plan Note (Signed)
Controlled on HCTZ and imdur.

## 2012-09-29 NOTE — Assessment & Plan Note (Signed)
Uncontrolled and will increase metformin to 1000 mg BID today. Also taking glipizide BID and lantus 50 units nightly and novolog 15 units twice with meals daily. Advised exercise and will likely need further adjustment to regimen at next visit. Repeat HgA1c at next visit. Will need eye exam.

## 2012-09-29 NOTE — Assessment & Plan Note (Signed)
No red flag signs and continues to see sports medicine. Needs to work on weight loss, stretching.

## 2012-09-29 NOTE — Addendum Note (Signed)
Addended by: Genella Mech A on: 09/29/2012 05:25 PM   Modules accepted: Orders

## 2012-10-06 ENCOUNTER — Ambulatory Visit: Payer: Medicaid Other | Admitting: Family Medicine

## 2012-10-18 ENCOUNTER — Telehealth: Payer: Self-pay | Admitting: *Deleted

## 2012-10-18 NOTE — Telephone Encounter (Signed)
I talked with Dr Dorise Hiss and  She wants pt to return to lower dose of metformin 500 mg bid. If this still causes abd discomfort call clinic and we will change medication.   Pt called and informed.  Reminded to take meds with food. She voices understanding and will try.

## 2012-10-18 NOTE — Telephone Encounter (Signed)
Pt called to report c/o abd pain and feeling sick all day since metformin was increased.   Pt last seen in clinic on 10/4 and increase of  metformin to 1000 mg BID  She also mentioned even lower dose bothers her. Pt # [phone] Please advise.

## 2012-10-20 ENCOUNTER — Telehealth: Payer: Self-pay | Admitting: *Deleted

## 2012-10-20 NOTE — Telephone Encounter (Signed)
She was declined handicapped sticker at last visit and do not feel that she needs nursing or disability. IF she is significantly changed from last visit then she needs to come back to be seen.   Abdominal pain could be from elevated sugars not metformin. What have her sugars been? She is on insulin and should have monitoring device at home. If significantly elevated advise to go to ED for care. If pain is significant advise to go to ED for care.  Thanks,  Dr. Dorise Hiss

## 2012-10-20 NOTE — Telephone Encounter (Signed)
Pt calls c/o abd pain continuing with the reduced dose of metformin also nausea, no vomiting or diarrhea. She has no transportation today for visit. Please advise. She also is asking about her disability paperwork, is it finished?

## 2012-10-23 ENCOUNTER — Telehealth: Payer: Self-pay | Admitting: *Deleted

## 2012-10-23 NOTE — Telephone Encounter (Signed)
Pt aware papers pt brought to clinic to be sign are still in Dr Dorise Hiss box - suggest for pt to call 10/27/12 2PM to remind [place] to have Dr Dorise Hiss sign papers. Stanton Kidney Brain Honeycutt RN 10/23/12 2PM

## 2012-11-14 ENCOUNTER — Other Ambulatory Visit: Payer: Self-pay | Admitting: *Deleted

## 2012-11-15 NOTE — Telephone Encounter (Signed)
This is not an appropriate medication for this patient. She has not followed up as she was supposed to and this has not been prescribed for her recently. She is supposed to be taking allergy medicine for the cough. She needs to be seen soon and should make an appointment.

## 2012-11-16 ENCOUNTER — Ambulatory Visit (INDEPENDENT_AMBULATORY_CARE_PROVIDER_SITE_OTHER): Payer: Medicaid Other | Admitting: Internal Medicine

## 2012-11-16 VITALS — BP 131/88 | HR 91 | Temp 98.7°F | Ht 67.0 in | Wt 208.7 lb

## 2012-11-16 DIAGNOSIS — I10 Essential (primary) hypertension: Secondary | ICD-10-CM

## 2012-11-16 DIAGNOSIS — IMO0001 Reserved for inherently not codable concepts without codable children: Secondary | ICD-10-CM

## 2012-11-16 DIAGNOSIS — F17200 Nicotine dependence, unspecified, uncomplicated: Secondary | ICD-10-CM

## 2012-11-16 DIAGNOSIS — E1165 Type 2 diabetes mellitus with hyperglycemia: Secondary | ICD-10-CM

## 2012-11-16 DIAGNOSIS — Z79899 Other long term (current) drug therapy: Secondary | ICD-10-CM

## 2012-11-16 DIAGNOSIS — M545 Low back pain, unspecified: Secondary | ICD-10-CM

## 2012-11-16 DIAGNOSIS — F411 Generalized anxiety disorder: Secondary | ICD-10-CM

## 2012-11-16 LAB — POCT GLYCOSYLATED HEMOGLOBIN (HGB A1C): Hemoglobin A1C: 10.4

## 2012-11-16 LAB — GLUCOSE, CAPILLARY: Glucose-Capillary: 260 mg/dL — ABNORMAL HIGH (ref 70–99)

## 2012-11-16 MED ORDER — GABAPENTIN 300 MG PO CAPS: 300.0000 mg | ORAL_CAPSULE | Freq: Three times a day (TID) | ORAL | Status: DC

## 2012-11-16 MED ORDER — ACETAMINOPHEN ER 650 MG PO TBCR: 650.0000 mg | EXTENDED_RELEASE_TABLET | Freq: Three times a day (TID) | ORAL | Status: DC | PRN

## 2012-11-16 MED ORDER — IPRATROPIUM-ALBUTEROL 18-103 MCG/ACT IN AERO: 2.0000 | INHALATION_SPRAY | Freq: Four times a day (QID) | RESPIRATORY_TRACT | Status: DC | PRN

## 2012-11-16 MED ORDER — MELOXICAM 7.5 MG PO TABS: 7.5000 mg | ORAL_TABLET | Freq: Every day | ORAL | Status: DC

## 2012-11-16 NOTE — Progress Notes (Signed)
Subjective:     Patient ID: Susan Holmes, female   DOB: 12/01/1956, 56 y.o.   MRN: [id number]  HPI 56 yo female with a PMH of DM, HTN, chronic back pain presents to the clinic complaining of continued low back pain. She has had this pain for the past 5 or 6 years. The pain is worst when walking and when laying in bed. She does not complain of any burning sensation, weakness, or shooting pain down either leg. The pain is isolated to her lower back and 7/10 in severity. Alleviating factors include heating pad, which she does approximately every other night. She is not sure which medications she is taking for her back pain, but believes she is taking neurontin which does not help her pain. She denies fever, has headaches occasionally, palpitations, and SOB. Patient is exercising (walking, weight exercises) and stretching but is limited by her back pain. She is requesting help to lose weight. She is also complaining of right knee pain which started 2 weeks ago and is constant, regardless of activity. Back and right knee become stiff after inactivity.    Past Medical History  Diagnosis Date  . Hepatitis B infection pt unsure    resolved  . Diabetes mellitus, type II   . HTN (hypertension)   . Hyperlipidemia   . Congenital blindness     L eye  . Tobacco abuse     .5 PPD smoker  . Anxiety   . Depression     Past Surgical History  Procedure Date  . Abdominal hysterectomy   Trigger point injection - right lumbar area- September 2013 Corticosteroid injection - right shoulder - May 2013  Family History: Family History  Problem Relation Age of Onset  . Heart disease Mother   . Hypertension Mother   . Heart disease Father   . Aneurysm Brother   . Aneurysm Sister   . Colon cancer Neg Hx   . Esophageal cancer Neg Hx   . Stomach cancer Neg Hx    History   Social History  . Marital Status: Divorced    Spouse Name: N/A    Number of Children: N/A  . Years of Education: N/A    Occupational History  . Not on file.   Social History Main Topics  . Smoking status: Current Every Day Smoker -- 0.5 packs/day    Types: Cigarettes  . Smokeless tobacco: Current User    Types: Chew     Comment: did not want to discuss any stopping or decreasing  . Alcohol Use: No  . Drug Use: No  . Sexually Active: Not Currently    Birth Control/ Protection: Surgical    Review of Systems  Constitutional: Positive for diaphoresis (during activity), fatigue and unexpected weight change (10 lbs over 2 months). Negative for fever and chills.  HENT: Negative for rhinorrhea, sneezing and trouble swallowing.   Eyes: Negative for itching.  Respiratory: Positive for cough (dry). Negative for chest tightness, shortness of breath and wheezing.   Cardiovascular: Negative for chest pain and leg swelling.  Gastrointestinal: Positive for abdominal pain (each morning before bowel movement) and constipation (>2 months, but has 2 bowel movements per day). Negative for nausea, diarrhea and blood in stool.  Genitourinary: Negative for urgency, frequency and difficulty urinating.  Musculoskeletal: Positive for back pain. Negative for joint swelling.  Skin: Negative for rash.  Neurological: Positive for headaches (occasional). Negative for dizziness.  Psychiatric/Behavioral: Positive for sleep disturbance (occasionally wakes up at 3am and  unable to fall asleep). Negative for suicidal ideas and self-injury.       Objective:  Physical Exam  Constitutional: She is oriented to person, place, and time. She appears well-nourished. No distress.  HENT:  Head: Normocephalic and atraumatic.  Eyes: Conjunctivae normal and EOM are normal. Pupils are equal, round, and reactive to light. Right eye exhibits no discharge.  Neck: Normal range of motion.  Cardiovascular: Normal rate, regular rhythm and normal heart sounds.  Exam reveals no gallop and no friction rub.   No murmur heard. Pulmonary/Chest: Effort  normal and breath sounds normal. No respiratory distress. She has no wheezes. She exhibits no tenderness.  Abdominal: Soft. Bowel sounds are normal. She exhibits no distension. There is no tenderness.  Musculoskeletal: She exhibits tenderness (lower back). She exhibits no edema.       Lumbar back: She exhibits decreased range of motion, tenderness and pain. She exhibits no swelling and no edema.       Arms:      Area of pain  Neurological: She is alert and oriented to person, place, and time. She has normal reflexes.  Skin: She is not diaphoretic.  Psychiatric: She has a normal mood and affect.   Filed Vitals:   11/16/12 0945  BP: 131/88  Pulse: 91  Temp: 98.7 F (37.1 C)      Assessment/Plan:     56 yo female presents to the clinic with continued chronic low back pain and right knee pain/stiffness. 1. Chronic low back pain/knee pain. Patient is attempting to exercise and stretch, but is limited by her pain. She has noticed some weight gain as well. - Continue stretching and exercising as tolerated - Weight loss to help diabetes and back/knee pain. Will arrange for diabetic teaching during next appointment with PCP. - Meloxicam 7.5mg  PO daily - Increase Gabapentin 300mg  TID - Tylenol 650mg  PO q8hrs as needed - Patient to make appointment with Dr. Jennette Kettle (Sports Medicine)  2. Expiratory wheezing. Likely 2/2 long history of smoking. May be component of emphysema/COPD. - Combivent 2 puffs into the lungs 4 times daily  3. Diabetes HbA1c 10.4 (11/16/12) - Will arrange for diabetic teaching during next appointment with her PCP

## 2012-11-16 NOTE — Patient Instructions (Addendum)
   Back Pain, Adult Back pain is very common. The pain often gets better over time. The cause of back pain is usually not dangerous. Most people can learn to manage their back pain on their own.  HOME CARE   Stay active. Start with short walks on flat ground if you can. Try to walk farther each day.  Do not sit, drive, or stand in one place for more than 30 minutes. Do not stay in bed.  Do not avoid exercise or work. Activity can help your back heal faster.  Be careful when you bend or lift an object. Bend at your knees, keep the object close to you, and do not twist.  Sleep on a firm mattress. Lie on your side, and bend your knees. If you lie on your back, put a pillow under your knees.  Only take medicines as told by your doctor.  Put ice on the injured area.  Put ice in a plastic bag.  Place a towel between your skin and the bag.  Leave the ice on for 15 to 20 minutes, 3 to 4 times a day for the first 2 to 3 days. After that, you can switch between ice and heat packs.  Ask your doctor about back exercises or massage.  Avoid feeling anxious or stressed. Find good ways to deal with stress, such as exercise. GET HELP RIGHT AWAY IF:   Your pain does not go away with rest or medicine.  Your pain does not go away in 1 week.  You have new problems.  You do not feel well.  The pain spreads into your legs.  You cannot control when you poop (bowel movement) or pee (urinate).  Your arms or legs feel weak or lose feeling (numbness).  You feel sick to your stomach (nauseous) or throw up (vomit).  You have belly (abdominal) pain.  You feel like you may pass out (faint). MAKE SURE YOU:   Understand these instructions.  Will watch your condition.  Will get help right away if you are not doing well or get worse. Document Released: 05/31/2008 Document Revised: 03/06/2012 Document Reviewed: 05/03/2011 [facility] Patient Information 2013 [place],  [place]. -------------------------------------  When taking Meloxicam: If your stomach gets upset, take the medication with food rather than taking it on an empty stomach.  Also, do not take other NSAIDS with this medication. Examples include: Naproxen, Aleve, Ibuprofen, Motrin.   Please continue to take your Asprin at 81mg  daily.   Make an appointment with Dr. Jennette Kettle from Sports Medicine within the next 2 weeks.   Please bring all of your medications to your next appointment!  Thank you for seeing [place] today!

## 2012-11-16 NOTE — Progress Notes (Signed)
  Subjective:    Patient ID: Susan Holmes, female    DOB: [birth year], 55 y.o.   MRN: [id number]  HPI  Please see the A&P for the status of the pt's chronic medical problems. Med Rec could not be performed as pt did not bring meds and doesn't know exactly what she is taking. Does take gabapentin. Pt denies taking any other pain meds.  Review of Systems  Constitutional: Negative for fever and chills.  Respiratory: Positive for shortness of breath. Negative for wheezing.        + DOE  Gastrointestinal: Positive for constipation. Negative for diarrhea.  Genitourinary: Negative for urgency and frequency.       No bowel / bladder incontinence  Musculoskeletal: Positive for back pain and gait problem.       Objective:   Physical Exam  Constitutional: She is oriented to person, place, and time. She appears well-developed and well-nourished. No distress.  HENT:  Head: Normocephalic and atraumatic.  Right Ear: External ear normal.  Left Ear: External ear normal.  Nose: Nose normal.  Eyes: Conjunctivae normal and EOM are normal.  Pulmonary/Chest: Effort normal. She has wheezes.       Faint bibasilar L > R  Musculoskeletal:       Lumbar back: She exhibits decreased range of motion and pain. She exhibits no tenderness, no bony tenderness, no swelling, no edema, no deformity and no spasm.       Pain in palpation of paralumbar B - just caudal to waist line.   Neurological: She is alert and oriented to person, place, and time.  Skin: Skin is warm and dry. She is not diaphoretic.  Psychiatric: She has a normal mood and affect. Her behavior is normal. Judgment and thought content normal.          Assessment & Plan:

## 2012-11-17 NOTE — Assessment & Plan Note (Signed)
Uncontrolled. Pt will be sch with Tobey Bride on same day as Dec appt with Dr Dorise Hiss.

## 2012-11-17 NOTE — Assessment & Plan Note (Signed)
Pt had plain fil in 10/05 that showed mild lumbar spine degenerative changes. Has had pain for 5-6 yrs and feels the sxs are getting worse. Has seen Dr Jennette Kettle starting in July 2013 and dx with muscle skeletal low back pain & facet arthropathy and rec that pt attend PT. She was only able to attend 2 sessions and was taught home exercises. She had a trigger point injection in 9/13 and had decreased pain for about 2 weeks. She missed her F/U appt.   She states she is still doing the home exercises which include walking, sit ups, weights, etc and does these QOD. She is only able to stand or walk about 5 min before the pain starts and she has to sit down and rest. She states the exercises make her pain worse. She does not lean forward on shopping cart bc leaning forward makes the pain worse.   She has no red flags that would indicate a need for further imaging. Plan is to 1. Increase gabapentin to 300 TID 2. Ask pt to make appt to return to Dr Jennette Kettle 3. Add Mobic QD 4. Add tyelonol QD 5. Cont home exercises to strengthen back muscles 6. Encourage weight loss - referral to [employer]

## 2012-11-17 NOTE — Assessment & Plan Note (Signed)
Mild wheezing today. Pt states she is trying to cut down. Also uses snuff. Pt endorses SOB and DOE. Pt has had inhaler in past but doesn't remember name, colour, and if prn. Will try Combivent inhaler. To let Dr Dorise Hiss know if helped sxs. Pt states has had PFT's but I cannot locate in system. To defer to PCP.

## 2012-11-17 NOTE — Assessment & Plan Note (Signed)
BP at goal today on HCTZ. No microalbuminuria so no urgent need for ACEI.

## 2012-12-01 ENCOUNTER — Ambulatory Visit (INDEPENDENT_AMBULATORY_CARE_PROVIDER_SITE_OTHER): Payer: Medicaid Other | Admitting: Dietician

## 2012-12-01 ENCOUNTER — Ambulatory Visit (INDEPENDENT_AMBULATORY_CARE_PROVIDER_SITE_OTHER): Payer: Medicaid Other | Admitting: Internal Medicine

## 2012-12-01 ENCOUNTER — Other Ambulatory Visit: Payer: Self-pay | Admitting: Internal Medicine

## 2012-12-01 ENCOUNTER — Encounter: Payer: Self-pay | Admitting: Internal Medicine

## 2012-12-01 VITALS — BP 110/70 | HR 88 | Temp 98.1°F | Ht 67.0 in | Wt 207.0 lb

## 2012-12-01 VITALS — Ht 67.0 in | Wt 207.2 lb

## 2012-12-01 DIAGNOSIS — E785 Hyperlipidemia, unspecified: Secondary | ICD-10-CM

## 2012-12-01 DIAGNOSIS — I10 Essential (primary) hypertension: Secondary | ICD-10-CM

## 2012-12-01 DIAGNOSIS — E1149 Type 2 diabetes mellitus with other diabetic neurological complication: Secondary | ICD-10-CM

## 2012-12-01 DIAGNOSIS — E1142 Type 2 diabetes mellitus with diabetic polyneuropathy: Secondary | ICD-10-CM

## 2012-12-01 DIAGNOSIS — IMO0001 Reserved for inherently not codable concepts without codable children: Secondary | ICD-10-CM

## 2012-12-01 DIAGNOSIS — E1165 Type 2 diabetes mellitus with hyperglycemia: Secondary | ICD-10-CM

## 2012-12-01 DIAGNOSIS — Z23 Encounter for immunization: Secondary | ICD-10-CM

## 2012-12-01 DIAGNOSIS — F17200 Nicotine dependence, unspecified, uncomplicated: Secondary | ICD-10-CM

## 2012-12-01 MED ORDER — LIRAGLUTIDE 18 MG/3ML ~~LOC~~ SOLN: 1.8000 mg | Freq: Every day | SUBCUTANEOUS | Status: DC

## 2012-12-01 MED ORDER — AMITRIPTYLINE HCL 150 MG PO TABS: 150.0000 mg | ORAL_TABLET | Freq: Every day | ORAL | Status: DC

## 2012-12-01 MED ORDER — NICOTINE POLACRILEX 4 MG MT GUM: 4.0000 mg | CHEWING_GUM | OROMUCOSAL | Status: DC | PRN

## 2012-12-01 NOTE — Patient Instructions (Addendum)
To decrease hunger and lower blood sugars   Please try to start eating a little something in the morning (oatmeal or hard boiled) and take 8 units  Novolog   Please take 8 units Novolog before lunch, if you skip lunch- skip Novolog              Take 15 units Novolog before dinner  P[lease check your blood sugar at least when you take your insulin and 4 times a day for a few days before your next doctor's appointment  1- When you wake up 2- Before lunch 3- Before dinner  4- Before bed  [please make a follow up with Lupita Leash in 3-4 weeks

## 2012-12-01 NOTE — Patient Instructions (Signed)
General Instructions:  STOP NEURONTIN (GABAPENTIN), STOP NOVOLOG  Take amitriptyline at nightime (1 pill). Take victoza daily under your skin.  Get Nicotine Gum to help you stop smoking!  You got a flu shot today and we will see you back in 1 month. Work on eating more regular meals and exercising to help your back. Please go back to see sports medicine Dr. Jennette Kettle.   Treatment Goals:  Goals (1 Years of Data) as of 12/01/2012          As of Today 11/16/12 09/29/12 09/23/12 09/23/12     Blood Pressure    . Blood Pressure < 140/90  126/89 131/88 122/84 141/93 137/82     Diet    . Have 3 meals a day  No         Result Component    . HEMOGLOBIN A1C < 7.0   10.4       . LDL CALC < 100            Progress Toward Treatment Goals:  Treatment Goal 12/01/2012  Hemoglobin A1C improved  Blood pressure unchanged  Stop smoking smoking the same amount    Self Care Goals & Plans:  Self Care Goal 12/01/2012  Manage my medications take my medicines as prescribed  Monitor my health keep track of my blood glucose  Eat healthy foods eat more vegetables; drink diet soda or water instead of juice or soda  Be physically active take a walk every day  Stop smoking go to the [employer] website ([employer].ee)    Home Blood Glucose Monitoring 12/01/2012  Check my blood sugar 2 times a day  When to check my blood sugar before breakfast; after dinner     Care Management & Community Referrals:  Referral 12/01/2012  Referrals made for care management support diabetes educator  Referrals made to community resources weight management

## 2012-12-01 NOTE — Progress Notes (Signed)
Diabetes Self-Management Training (DSMT)  Follow-Up 1 Visit- last visit 10-13-12  12/01/2012 [name], identified by name and date of birth, is a 56 y.o. female with Type 2 Diabetes.  ASSESSMENT Patient concerns are Glycemic control and Weight control.  Height 5\' 7"  (1.702 m), weight 207 lb 3.2 oz (93.985 kg). Body mass index is 32.45 kg/(m^2). Lab Results  Component Value Date   LDLCALC 116* 08/04/2011   Lab Results  Component Value Date   HGBA1C 10.4 11/16/2012   Special needs: None Patients belief/attitude about diabetes: Diabetes can be controlled. Diabetes Complications: None Prior DM Education: Yes   Medications See Medications list.  Not taking as prescribed and Is interested in learning more- see medications section of chart- taking novolog incorrectly every 12 hours regardless of mealtimes   Exercise Plan Doing walking for 30 minutesa every other day.   Self-Monitoring  Monitor: accu check aviva Frequency of testing: less than 3-5 times/week Breakfast: 149 this am  Hyperglycemia: Yes Weekly Hypoglycemia: No- she reports hunger after taking insulin without a meal so suspect she is having lows   Meal Planning Some knowledge, Interested in improving and concerned about lack of food due to food stamps cuts   Assessment comments: patient is concerned about weight and cut in food stamps, reports little food intake. Skips breakfast daily, eats lunch every other day and dinner consistently and a large bedtime snack about every other day.  She asked about Victoza and this may be good alternative for her since she is not checkcng her blood sugars very often, is complaining abut weight gain and  complains of increased hunger.   INDIVIDUAL DIABETES EDUCATION PLAN:  Nutrition management Medication Monitoring _______________________________________________________________________  Intervention TOPICS COVERED TODAY:  Nutrition management  Meal timing in regards to  the patients' current diabetes medication. how to eat healthy on a budget Medication  Reviewed patients medication for diabetes, action, purpose, timing of dose and side effects. Monitoring  Purpose and frequency of SMBG.  PATIENTS GOALS/PLAN (copy and paste in patient instructions so patient receives a copy): 1.  Learning Objective:       State importance of timing Novolog insulin with meals 2.  Behavioral Objective:         Medications: To improve blood glucose levels, I will take my medication as prescribed Never 0%  Personalized Follow-Up Plan for Ongoing Self Management Support:  Doctor's Office, family and CDE visits ______________________________________________________________________   Outcomes Expected outcomes: Demonstrated interest in learning.Expect positive changes in lifestyle. Self-care Barriers: Lack of material resources, Coping skills Education material provided: yes Patient to contact team via Phone if problems or questions. Time in: 1130     Time out: 1230 Future DSMT - 4-6 wks   Plyler, Lupita Leash

## 2012-12-02 ENCOUNTER — Other Ambulatory Visit: Payer: Self-pay | Admitting: Internal Medicine

## 2012-12-04 ENCOUNTER — Telehealth: Payer: Self-pay | Admitting: *Deleted

## 2012-12-04 MED ORDER — NICOTINE 21 MG/24HR TD PT24: 1.0000 | MEDICATED_PATCH | TRANSDERMAL | Status: DC

## 2012-12-04 NOTE — Assessment & Plan Note (Signed)
Lipids elevated slightly. Will continue with medication and weight loss and recheck at future visit.

## 2012-12-04 NOTE — Assessment & Plan Note (Signed)
BP Readings from Last 3 Encounters:  12/01/12 110/70  11/16/12 131/88  09/29/12 122/84    Lab Results  Component Value Date   NA 136 04/21/2012   K 4.5 04/21/2012   CREATININE 0.90 04/21/2012    Assessment:  Blood pressure control: mildly elevated  Progress toward BP goal:  unchanged  Comments: none  Plan:  Medications:  continue current medications, HCTZ 12.5 mg daily  Educational resources provided: brochure  Self management tools provided:    Other plans: none

## 2012-12-04 NOTE — Telephone Encounter (Signed)
Sent order for patches however not sure patient will want that.   Thanks,  Dr. Dorise Hiss

## 2012-12-04 NOTE — Assessment & Plan Note (Addendum)
  Assessment:  Progress toward smoking cessation:  smoking the same amount  Barriers to progress toward smoking cessation:  concern about weight gain  Comments: advised to quit at today's visit  Plan:  Instruction/counseling given:  I advised patient to stop smoking.  Educational resources provided:  [employer]) brochure  Self management tools provided:  smoking cessation letter  Medications to assist with smoking cessation:  Nicotine Gum and patch Patient agreed to the following self-care plans for smoking cessation:  go to the [employer] ([employer].ee)   Other: patient will try to quit at this time

## 2012-12-04 NOTE — Progress Notes (Signed)
Subjective:     Patient ID: Susan Holmes, female   DOB: 11-26-1956, 56 y.o.   MRN: [id number]  HPI The patient is a 56 YO woman with medical history of DM II, anxiety, tobacco abuse, HTN, back pain, shoulder pain who comes in for a routine follow up visit. She is apparently on disability and provides paperwork to prove this but it is extremely unclear why she is on this. I spoke with a lawyer's office to try to obtain paperwork about this decision as she does want [place] to fill out papers for disability for some student loans but we were unable to obtain them. She herself states that it may be related to some mood problems and alcoholism which she sees [facility] for and I advised her to talk to them about these papers. She is not having any additional or new medical problems. She is still in some pain however has not followed up with sports medicine and is unable to afford PT although this was the recommended treatment. She continues to smoke and I advised her to quit. She states that her sugars are doing okay at home and spent some time with the diabetes educator prior to our visit and would like to try victoza instead of mealtime coverage as she is unable to eat many meals due to being on food stamps and she is concerned about how the cut to this will affect her ability to eat. She is having some restlessness in her limbs and is not sure why. This has been going on for several years and no real precipitant is identified. She thinks it is worse at night but is not sure. She is sedentary most of the day.   Review of Systems  Constitutional: Negative for fever, chills, diaphoresis, activity change, appetite change, fatigue and unexpected weight change.  Respiratory: Positive for cough. Negative for chest tightness, shortness of breath and wheezing.        Occasional cough  Cardiovascular: Negative for chest pain, palpitations and leg swelling.  Gastrointestinal: Negative for nausea, vomiting, abdominal pain  and diarrhea.  Musculoskeletal: Positive for myalgias and back pain.  Skin: Negative.   Neurological: Negative for dizziness, tremors, seizures, syncope, facial asymmetry, speech difficulty, weakness, light-headedness, numbness and headaches.       Some feeling of physical restlessness when mind is calm.  Psychiatric/Behavioral: Negative.        Objective:   Physical Exam  Constitutional: She is oriented to person, place, and time. She appears well-developed and well-nourished. No distress.  HENT:  Head: Normocephalic and atraumatic.  Eyes: EOM are normal. Pupils are equal, round, and reactive to light.  Neck: Normal range of motion. Neck supple.  Cardiovascular: Normal rate and regular rhythm.   Pulmonary/Chest: Effort normal and breath sounds normal. No respiratory distress. She has no wheezes. She has no rales.  Abdominal: Soft. Bowel sounds are normal. She exhibits no distension and no mass. There is no tenderness. There is no rebound and no guarding.  Musculoskeletal:       The patient's right wrist is intact, with full range of motion. No swelling. No snuff box tenderness. Able to move all fingers without pain.  Neurological: She is alert and oriented to person, place, and time. No cranial nerve deficit.  Skin: Skin is warm and dry. She is not diaphoretic.       Assessment/Plan:   1. Please see problem oriented charting.   2. Disposition - Will STOP novolog, continue lantus. Start victoza.  Will see back for close follow up in 1 month. Will stop gabapentin and start amitriptyline 150 mg nightly. Also advised close follow up with diabetes educator. Will work on weight loss and tobacco cessation. Sent in rx for nicotine gum and patch depending on insurance coverage.

## 2012-12-04 NOTE — Telephone Encounter (Signed)
Pt called and she is not able to get the nicorette gum you ordered. Will you try for the patches? [facility] on [address].

## 2012-12-04 NOTE — Assessment & Plan Note (Signed)
Lab Results  Component Value Date   HGBA1C 10.4 11/16/2012   HGBA1C 12.0 08/01/2012   HGBA1C 10.2 05/15/2012     Assessment:  Diabetes control: poor control (HgbA1C >9%)  Progress toward A1C goal:  improved  Comments: visit with diabetes educator prior to today's visit.  Plan:  Medications:  Continue lantus 50 units at night time, START victoza daily, continue metformin 1000 mg BID, continue glipizide for now however may need to stop soon  Home glucose monitoring:   Frequency: 2 times a day   Timing: before breakfast;after dinner  Instruction/counseling given: reminded to get eye exam, reminded to bring blood glucose meter & log to each visit, reminded to bring medications to each visit and discussed the need for weight loss  Educational resources provided: brochure  Self management tools provided: copy of home glucose meter download;instructions for home glucose monitoring  Other plans: none

## 2012-12-05 NOTE — Telephone Encounter (Signed)
Pt informed

## 2012-12-13 ENCOUNTER — Telehealth: Payer: Self-pay | Admitting: *Deleted

## 2012-12-13 NOTE — Telephone Encounter (Signed)
Call to [employer] [employer] [facility] [employer] for Prior Authorization for Victoza.  Pt will need to try and fail Byetta.  Message to pt PCP- Dr. Dorise Hiss.  Angelina Ok, RN 12/13/2012 4:48 PM

## 2012-12-14 MED ORDER — EXENATIDE 5 MCG/0.02ML ~~LOC~~ SOPN: 5.0000 ug | PEN_INJECTOR | Freq: Two times a day (BID) | SUBCUTANEOUS | Status: DC

## 2012-12-14 NOTE — Telephone Encounter (Signed)
Thanks, sent rx in for byetta. Will you call patient and let her know?  Dr. Dorise Hiss

## 2013-01-05 ENCOUNTER — Encounter: Payer: Medicaid Other | Admitting: Dietician

## 2013-01-05 ENCOUNTER — Encounter: Payer: Medicaid Other | Admitting: Internal Medicine

## 2013-01-26 ENCOUNTER — Encounter: Payer: Self-pay | Admitting: Internal Medicine

## 2013-01-26 ENCOUNTER — Ambulatory Visit (INDEPENDENT_AMBULATORY_CARE_PROVIDER_SITE_OTHER): Payer: Medicaid Other | Admitting: Dietician

## 2013-01-26 ENCOUNTER — Ambulatory Visit (INDEPENDENT_AMBULATORY_CARE_PROVIDER_SITE_OTHER): Payer: Medicaid Other | Admitting: Internal Medicine

## 2013-01-26 VITALS — BP 135/93 | HR 101 | Temp 98.0°F | Ht 67.5 in | Wt 208.0 lb

## 2013-01-26 DIAGNOSIS — E1149 Type 2 diabetes mellitus with other diabetic neurological complication: Secondary | ICD-10-CM

## 2013-01-26 DIAGNOSIS — M75101 Unspecified rotator cuff tear or rupture of right shoulder, not specified as traumatic: Secondary | ICD-10-CM

## 2013-01-26 DIAGNOSIS — F329 Major depressive disorder, single episode, unspecified: Secondary | ICD-10-CM

## 2013-01-26 DIAGNOSIS — F17200 Nicotine dependence, unspecified, uncomplicated: Secondary | ICD-10-CM

## 2013-01-26 DIAGNOSIS — E785 Hyperlipidemia, unspecified: Secondary | ICD-10-CM

## 2013-01-26 DIAGNOSIS — M545 Low back pain, unspecified: Secondary | ICD-10-CM

## 2013-01-26 DIAGNOSIS — M719 Bursopathy, unspecified: Secondary | ICD-10-CM

## 2013-01-26 DIAGNOSIS — M67919 Unspecified disorder of synovium and tendon, unspecified shoulder: Secondary | ICD-10-CM

## 2013-01-26 DIAGNOSIS — F411 Generalized anxiety disorder: Secondary | ICD-10-CM

## 2013-01-26 DIAGNOSIS — Z720 Tobacco use: Secondary | ICD-10-CM

## 2013-01-26 DIAGNOSIS — E1142 Type 2 diabetes mellitus with diabetic polyneuropathy: Secondary | ICD-10-CM

## 2013-01-26 DIAGNOSIS — H5440 Blindness, one eye, unspecified eye: Secondary | ICD-10-CM

## 2013-01-26 DIAGNOSIS — I10 Essential (primary) hypertension: Secondary | ICD-10-CM

## 2013-01-26 LAB — BASIC METABOLIC PANEL WITH GFR
BUN: 10 mg/dL (ref 6–23)
CO2: 26 mEq/L (ref 19–32)
Calcium: 11.1 mg/dL — ABNORMAL HIGH (ref 8.4–10.5)
Chloride: 104 mEq/L (ref 96–112)
Creat: 0.89 mg/dL (ref 0.50–1.10)
GFR, Est African American: 83 mL/min
GFR, Est Non African American: 72 mL/min
Glucose, Bld: 183 mg/dL — ABNORMAL HIGH (ref 70–99)
Potassium: 4.2 mEq/L (ref 3.5–5.3)
Sodium: 138 mEq/L (ref 135–145)

## 2013-01-26 LAB — TSH: TSH: 0.908 u[IU]/mL (ref 0.350–4.500)

## 2013-01-26 LAB — CBC
HCT: 38.6 % (ref 36.0–46.0)
Hemoglobin: 12.2 g/dL (ref 12.0–15.0)
MCH: 21.1 pg — ABNORMAL LOW (ref 26.0–34.0)
MCHC: 31.6 g/dL (ref 30.0–36.0)
MCV: 66.8 fL — ABNORMAL LOW (ref 78.0–100.0)
Platelets: 249 10*3/uL (ref 150–400)
RBC: 5.78 MIL/uL — ABNORMAL HIGH (ref 3.87–5.11)
RDW: 17.3 % — ABNORMAL HIGH (ref 11.5–15.5)
WBC: 8.3 10*3/uL (ref 4.0–10.5)

## 2013-01-26 LAB — LIPID PANEL
Cholesterol: 258 mg/dL — ABNORMAL HIGH (ref 0–200)
HDL: 51 mg/dL (ref >39)
LDL Cholesterol: 183 mg/dL — ABNORMAL HIGH (ref 0–99)
Total CHOL/HDL Ratio: 5.1 Ratio
Triglycerides: 122 mg/dL (ref <150)
VLDL: 24 mg/dL (ref 0–40)

## 2013-01-26 MED ORDER — MELOXICAM 7.5 MG PO TABS: 7.5000 mg | ORAL_TABLET | Freq: Every day | ORAL | Status: DC

## 2013-01-26 MED ORDER — TRAMADOL HCL 50 MG PO TABS: 50.0000 mg | ORAL_TABLET | Freq: Two times a day (BID) | ORAL | Status: DC | PRN

## 2013-01-26 MED ORDER — PRAVASTATIN SODIUM 40 MG PO TABS: 40.0000 mg | ORAL_TABLET | Freq: Every day | ORAL | Status: DC

## 2013-01-26 MED ORDER — IPRATROPIUM-ALBUTEROL 18-103 MCG/ACT IN AERO: 2.0000 | INHALATION_SPRAY | Freq: Four times a day (QID) | RESPIRATORY_TRACT | Status: DC | PRN

## 2013-01-26 MED ORDER — SERTRALINE HCL 100 MG PO TABS: 150.0000 mg | ORAL_TABLET | Freq: Every day | ORAL | Status: DC

## 2013-01-26 NOTE — Assessment & Plan Note (Signed)
Patient will go back and get an eye exam.

## 2013-01-26 NOTE — Progress Notes (Signed)
Diabetes Self-Management Training (DSMT)  Follow-Up 2 Visit- last visit 12-01-12  01/26/2013 [name], identified by name and date of birth, is a 57 y.o. female with Type 2 Diabetes.  ASSESSMENT Patient concerns are Glycemic control and Weight control.   208#, BMI- 32.2 Lab Results  Component Value Date   LDLCALC 116* 08/04/2011   Lab Results  Component Value Date   HGBA1C 10.4 11/16/2012    Medications See Medications list.  Taking as prescribed and Is interested in learning more- some nausea reported   Exercise Plan Doing walking for 30 minutesa every day.   Self-Monitoring  Monitor: accu check aviva Frequency of testing: less than ~ 1x/day Average ~ 208 for last 2 months, but lower the past few weeks. 100-200 rather than 150-250 Hyperglycemia: Yes Weekly, but improving Hypoglycemia: No   Meal Planning Some knowledge, Interested in improving and concerned about lack of food due to food stamps cuts   Assessment comments: weight with little change, patient reports exercing more and eating reasonable amounts in 2 meals and a bedtime snack daily.    INDIVIDUAL DIABETES EDUCATION PLAN:  Nutrition management Medication Monitoring _______________________________________________________________________  Intervention TOPICS COVERED TODAY:  Nutrition management  how to eat healthy on a budget, encouraging more fruits and vegetables  Monitoring  Purpose and frequency and how to keep a food record to assist her with weight loss.  PATIENTS GOALS/PLAN (copy and paste in patient instructions so patient receives a copy): 1.  Learning Objective:       Show how to keep food record 2.  Behavioral Objective:             Nutrition: write down what i eat and drink and bring back at future visit         Medications: To improve blood glucose levels, I will take my medication as prescribed Never 100%  Personalized Follow-Up Plan for Ongoing Self Management Support:  Doctor's  Office, family and CDE visits ______________________________________________________________________   Outcomes Expected outcomes: Demonstrated interest in learning.Expect positive changes in lifestyle. Self-care Barriers: Lack of material resources, Coping skills Education material provided: yes Patient to contact team via Phone if problems or questions. Time in: 1130     Time out: 1200 Future DSMT - 4-6 wks   Plyler, Lupita Leash

## 2013-01-26 NOTE — Progress Notes (Signed)
Subjective:     Patient ID: Susan Holmes, female   DOB: 1956/04/19, 57 y.o.   MRN: [id number]  HPI The patient was seen in the office for a follow up visit of her diabetes after starting new injectible medication byetta. She has done well with the BID injections and is not having any hypoglycemic episodes. She has noticed that overall her sugars have been lower on this medication. She is noticing some lack of energy and shortness of breath at times when she has been exerting herself a lot. She has chronic right shoulder pain which is not changed lately and she has stopped exercising as much as she should with the shoulder exercises. She uses tylenol for her back and tramadol occasionally and say that these work well usually. No chest pain with walking or at rest. She states she will go get eye exam and that she has been smoking more lately and she isn't sure why or how to smoke less.   Review of Systems  Constitutional: Negative for fever, chills, diaphoresis, activity change, appetite change, fatigue and unexpected weight change.  Respiratory: Positive for cough. Negative for chest tightness, shortness of breath and wheezing.        Occasional cough  Cardiovascular: Negative for chest pain, palpitations and leg swelling.  Gastrointestinal: Negative for nausea, vomiting, abdominal pain and diarrhea.  Musculoskeletal: Positive for myalgias and back pain.  Skin: Negative.   Neurological: Negative for dizziness, tremors, seizures, syncope, facial asymmetry, speech difficulty, weakness, light-headedness, numbness and headaches.       Some feeling of physical restlessness when mind is calm.  Psychiatric/Behavioral: Negative.        Objective:   Physical Exam  Constitutional: She is oriented to person, place, and time. She appears well-developed and well-nourished. No distress.  HENT:  Head: Normocephalic and atraumatic.  Eyes: EOM are normal. Pupils are equal, round, and reactive to light.   Neck: Normal range of motion. Neck supple.  Cardiovascular: Normal rate and regular rhythm.   Pulmonary/Chest: Effort normal and breath sounds normal. No respiratory distress. She has no wheezes. She has no rales.  Abdominal: Soft. Bowel sounds are normal. She exhibits no distension and no mass. There is no tenderness. There is no rebound and no guarding.  Musculoskeletal: She exhibits tenderness.  Neurological: She is alert and oriented to person, place, and time. No cranial nerve deficit.  Skin: Skin is warm and dry. She is not diaphoretic.       Assessment/Plan:   1. Please see problem oriented charting.  2. Disposition - Patient will be seen back in 2 months for follow up and diabetes check. She will get eye exam. She will work on exercising more and smoking less. She is to continue with her current medications. Will check PFT's and CBC, BMP, TSH, lipids.

## 2013-01-26 NOTE — Assessment & Plan Note (Signed)
Will check lipid panel at today's visit.

## 2013-01-26 NOTE — Assessment & Plan Note (Signed)
Advised resumption of her home exercises. Her pain was much better controlled when she was doing these regularly.

## 2013-01-26 NOTE — Assessment & Plan Note (Signed)
Lab Results  Component Value Date   HGBA1C 10.4 11/16/2012   HGBA1C 12.0 08/01/2012   HGBA1C 10.2 05/15/2012     Assessment:  Diabetes control: poor control (HgbA1C >9%)  Progress toward A1C goal:  improved  Comments: Per home glucose meter sugars are trending downwards. Will recheck A1c at next visit in 2 months.  Plan:  Medications:  continue current medications, Byetta Ashley BID, Lantus 50 units QHS, metformin 1000 BID, glipizide 5 mg BID  Home glucose monitoring:   Frequency:     Timing:    Instruction/counseling given: reminded to get eye exam, reminded to bring blood glucose meter & log to each visit and discussed the need for weight loss  Educational resources provided: brochure  Self management tools provided:    Other plans:

## 2013-01-26 NOTE — Patient Instructions (Signed)
Follow up in 4 weeks - bring meter and food record with you.

## 2013-01-26 NOTE — Assessment & Plan Note (Signed)
  Assessment:  Progress toward smoking cessation:  smoking more  Barriers to progress toward smoking cessation:  lack of motivation to quit  Comments:   Plan:  Instruction/counseling given:  I counseled patient on the dangers of tobacco use.  Educational resources provided:  [employer]) brochure  Self management tools provided:     Medications to assist with smoking cessation:  Nicotine Patch Patient agreed to the following self-care plans for smoking cessation:      Other: none

## 2013-01-26 NOTE — Patient Instructions (Signed)
General Instructions:  We are going to check your lungs to see if the smoking has caused a problem. We are also checking some labs for your tiredness. It may be your body adjusting to lower sugar levels. Keep exercising every day and work on your eating.  We will see you back in 2 months or sooner if you are not feeling better. Call us with any questions or problems at [phone].  Treatment Goals:  Goals (1 Years of Data) as of 01/26/2013          As of Today 12/01/12 12/01/12 11/16/12 09/29/12     Blood Pressure    . Blood Pressure < 140/90  136/93 110/70 126/89 131/88 122/84     Diet    . Have 3 meals a day   No        Result Component    . HEMOGLOBIN A1C < 7.0     10.4     . LDL CALC < 100            Progress Toward Treatment Goals:  Treatment Goal 01/26/2013  Hemoglobin A1C improved  Blood pressure at goal  Stop smoking smoking more    Self Care Goals & Plans:  Self Care Goal 01/26/2013  Manage my medications take my medicines as prescribed; refill my medications on time  Monitor my health check my feet daily; keep track of my blood glucose  Eat healthy foods eat foods that are low in salt; eat baked foods instead of fried foods  Be physically active -  Stop smoking -    Home Blood Glucose Monitoring 12/01/2012  Check my blood sugar 2 times a day  When to check my blood sugar before breakfast; after dinner     Care Management & Community Referrals:  Referral 01/26/2013  Referrals made for care management support diabetes educator  Referrals made to community resources smoking cessation

## 2013-01-26 NOTE — Assessment & Plan Note (Signed)
Suspect that this is playing into her problems with energy however she states that her mood is much better controlled.

## 2013-01-26 NOTE — Addendum Note (Signed)
Addended by: Maura Crandall on: 01/26/2013 04:19 PM   Modules accepted: Orders

## 2013-01-26 NOTE — Assessment & Plan Note (Signed)
BP Readings from Last 3 Encounters:  01/26/13 136/93  12/01/12 110/70  11/16/12 131/88    Lab Results  Component Value Date   NA 136 04/21/2012   K 4.5 04/21/2012   CREATININE 0.90 04/21/2012    Assessment:  Blood pressure control: mildly elevated  Progress toward BP goal:  at goal  Comments: none  Plan:  Medications:  continue current medications, HCTZ  Educational resources provided: brochure  Self management tools provided: home blood pressure logbook  Other plans: none

## 2013-02-01 ENCOUNTER — Other Ambulatory Visit: Payer: Self-pay | Admitting: Internal Medicine

## 2013-02-01 DIAGNOSIS — E785 Hyperlipidemia, unspecified: Secondary | ICD-10-CM

## 2013-02-01 MED ORDER — PRAVASTATIN SODIUM 40 MG PO TABS: 40.0000 mg | ORAL_TABLET | Freq: Every day | ORAL | Status: DC

## 2013-02-05 ENCOUNTER — Ambulatory Visit (HOSPITAL_COMMUNITY)
Admission: RE | Admit: 2013-02-05 | Discharge: 2013-02-05 | Disposition: A | Discharge status: Home / Self Care | Payer: Medicaid Other | Source: Ambulatory Visit | Attending: Internal Medicine | Admitting: Internal Medicine

## 2013-02-05 ENCOUNTER — Telehealth: Payer: Self-pay | Admitting: *Deleted

## 2013-02-05 DIAGNOSIS — Z720 Tobacco use: Secondary | ICD-10-CM

## 2013-02-05 DIAGNOSIS — F17200 Nicotine dependence, unspecified, uncomplicated: Secondary | ICD-10-CM | POA: Insufficient documentation

## 2013-02-05 MED ORDER — ALBUTEROL SULFATE (5 MG/ML) 0.5% IN NEBU
2.5000 mg | INHALATION_SOLUTION | Freq: Once | RESPIRATORY_TRACT | Status: AC
  Administered 2013-02-05: 2.5 mg via RESPIRATORY_TRACT

## 2013-02-05 NOTE — Telephone Encounter (Signed)
combivent hfa no longer made, please send script for combivent respimat, the normal dose per the pharmacy is 1 puff every 6 hrs

## 2013-02-06 MED ORDER — IPRATROPIUM-ALBUTEROL 20-100 MCG/ACT IN AERS: 1.0000 | INHALATION_SPRAY | Freq: Four times a day (QID) | RESPIRATORY_TRACT | Status: DC | PRN

## 2013-02-06 NOTE — Telephone Encounter (Signed)
Done, thanks

## 2013-02-12 ENCOUNTER — Other Ambulatory Visit: Payer: Self-pay | Admitting: *Deleted

## 2013-02-13 MED ORDER — ACCU-CHEK AVIVA PLUS W/DEVICE KIT: 1.0000 | PACK | Freq: Once | Status: DC

## 2013-03-06 ENCOUNTER — Telehealth: Payer: Self-pay | Admitting: *Deleted

## 2013-03-06 ENCOUNTER — Encounter (HOSPITAL_COMMUNITY): Payer: Self-pay | Admitting: Internal Medicine

## 2013-03-06 ENCOUNTER — Inpatient Hospital Stay (HOSPITAL_COMMUNITY)
Admission: AD | Admit: 2013-03-06 | Discharge: 2013-03-08 | DRG: 204 | Disposition: A | Discharge status: Home / Self Care | Payer: Medicaid Other | Source: Ambulatory Visit | Attending: Internal Medicine | Admitting: Internal Medicine

## 2013-03-06 ENCOUNTER — Ambulatory Visit (INDEPENDENT_AMBULATORY_CARE_PROVIDER_SITE_OTHER): Payer: Medicaid Other | Admitting: Internal Medicine

## 2013-03-06 VITALS — BP 115/77 | HR 135 | Temp 97.0°F | Wt 199.9 lb

## 2013-03-06 DIAGNOSIS — I2782 Chronic pulmonary embolism: Secondary | ICD-10-CM | POA: Diagnosis present

## 2013-03-06 DIAGNOSIS — E213 Hyperparathyroidism, unspecified: Secondary | ICD-10-CM | POA: Diagnosis present

## 2013-03-06 DIAGNOSIS — Z79899 Other long term (current) drug therapy: Secondary | ICD-10-CM

## 2013-03-06 DIAGNOSIS — R739 Hyperglycemia, unspecified: Secondary | ICD-10-CM

## 2013-03-06 DIAGNOSIS — J4489 Other specified chronic obstructive pulmonary disease: Secondary | ICD-10-CM | POA: Diagnosis present

## 2013-03-06 DIAGNOSIS — E872 Acidosis, unspecified: Secondary | ICD-10-CM | POA: Diagnosis present

## 2013-03-06 DIAGNOSIS — N179 Acute kidney failure, unspecified: Secondary | ICD-10-CM | POA: Diagnosis present

## 2013-03-06 DIAGNOSIS — Z90710 Acquired absence of both cervix and uterus: Secondary | ICD-10-CM

## 2013-03-06 DIAGNOSIS — IMO0002 Reserved for concepts with insufficient information to code with codable children: Secondary | ICD-10-CM | POA: Diagnosis present

## 2013-03-06 DIAGNOSIS — R000 Tachycardia, unspecified: Secondary | ICD-10-CM

## 2013-03-06 DIAGNOSIS — F329 Major depressive disorder, single episode, unspecified: Secondary | ICD-10-CM

## 2013-03-06 DIAGNOSIS — R0602 Shortness of breath: Principal | ICD-10-CM | POA: Diagnosis present

## 2013-03-06 DIAGNOSIS — F17200 Nicotine dependence, unspecified, uncomplicated: Secondary | ICD-10-CM | POA: Diagnosis present

## 2013-03-06 DIAGNOSIS — J449 Chronic obstructive pulmonary disease, unspecified: Secondary | ICD-10-CM | POA: Diagnosis present

## 2013-03-06 DIAGNOSIS — E1169 Type 2 diabetes mellitus with other specified complication: Secondary | ICD-10-CM | POA: Diagnosis present

## 2013-03-06 DIAGNOSIS — R5383 Other fatigue: Secondary | ICD-10-CM | POA: Diagnosis present

## 2013-03-06 DIAGNOSIS — E1149 Type 2 diabetes mellitus with other diabetic neurological complication: Secondary | ICD-10-CM

## 2013-03-06 DIAGNOSIS — H5440 Blindness, one eye, unspecified eye: Secondary | ICD-10-CM | POA: Diagnosis present

## 2013-03-06 DIAGNOSIS — E119 Type 2 diabetes mellitus without complications: Secondary | ICD-10-CM

## 2013-03-06 DIAGNOSIS — M19019 Primary osteoarthritis, unspecified shoulder: Secondary | ICD-10-CM

## 2013-03-06 DIAGNOSIS — F32A Depression, unspecified: Secondary | ICD-10-CM | POA: Diagnosis present

## 2013-03-06 DIAGNOSIS — E1165 Type 2 diabetes mellitus with hyperglycemia: Secondary | ICD-10-CM | POA: Diagnosis present

## 2013-03-06 DIAGNOSIS — E860 Dehydration: Secondary | ICD-10-CM | POA: Diagnosis present

## 2013-03-06 DIAGNOSIS — I10 Essential (primary) hypertension: Secondary | ICD-10-CM | POA: Diagnosis present

## 2013-03-06 DIAGNOSIS — F411 Generalized anxiety disorder: Secondary | ICD-10-CM | POA: Diagnosis present

## 2013-03-06 DIAGNOSIS — F3289 Other specified depressive episodes: Secondary | ICD-10-CM | POA: Diagnosis present

## 2013-03-06 DIAGNOSIS — E785 Hyperlipidemia, unspecified: Secondary | ICD-10-CM | POA: Diagnosis present

## 2013-03-06 DIAGNOSIS — R5381 Other malaise: Secondary | ICD-10-CM | POA: Diagnosis present

## 2013-03-06 HISTORY — DX: Unspecified osteoarthritis, unspecified site: M19.90

## 2013-03-06 LAB — COMPREHENSIVE METABOLIC PANEL
ALT: 47 U/L — ABNORMAL HIGH (ref 0–35)
AST: 38 U/L — ABNORMAL HIGH (ref 0–37)
Albumin: 4.5 g/dL (ref 3.5–5.2)
Alkaline Phosphatase: 101 U/L (ref 39–117)
BUN: 18 mg/dL (ref 6–23)
CO2: 23 mEq/L (ref 19–32)
Calcium: 11.8 mg/dL — ABNORMAL HIGH (ref 8.4–10.5)
Chloride: 89 mEq/L — ABNORMAL LOW (ref 96–112)
Creat: 1.32 mg/dL — ABNORMAL HIGH (ref 0.50–1.10)
Glucose, Bld: 470 mg/dL — ABNORMAL HIGH (ref 70–99)
Potassium: 3.9 mEq/L (ref 3.5–5.3)
Sodium: 130 mEq/L — ABNORMAL LOW (ref 135–145)
Total Bilirubin: 0.4 mg/dL (ref 0.3–1.2)
Total Protein: 8.8 g/dL — ABNORMAL HIGH (ref 6.0–8.3)

## 2013-03-06 LAB — CBC
HCT: 42.5 % (ref 36.0–46.0)
Hemoglobin: 14 g/dL (ref 12.0–15.0)
MCH: 22 pg — ABNORMAL LOW (ref 26.0–34.0)
MCHC: 32.9 g/dL (ref 30.0–36.0)
MCV: 66.9 fL — ABNORMAL LOW (ref 78.0–100.0)
Platelets: 299 10*3/uL (ref 150–400)
RBC: 6.35 MIL/uL — ABNORMAL HIGH (ref 3.87–5.11)
RDW: 15.4 % (ref 11.5–15.5)
WBC: 11.9 10*3/uL — ABNORMAL HIGH (ref 4.0–10.5)

## 2013-03-06 LAB — BASIC METABOLIC PANEL
BUN: 21 mg/dL (ref 6–23)
CO2: 26 mEq/L (ref 19–32)
Calcium: 12 mg/dL — ABNORMAL HIGH (ref 8.4–10.5)
Chloride: 94 mEq/L — ABNORMAL LOW (ref 96–112)
Creatinine, Ser: 1.56 mg/dL — ABNORMAL HIGH (ref 0.50–1.10)
GFR calc Af Amer: 41 mL/min — ABNORMAL LOW (ref >90)
GFR calc non Af Amer: 36 mL/min — ABNORMAL LOW (ref >90)
Glucose, Bld: 280 mg/dL — ABNORMAL HIGH (ref 70–99)
Potassium: 3.4 mEq/L — ABNORMAL LOW (ref 3.5–5.1)
Sodium: 135 mEq/L (ref 135–145)

## 2013-03-06 LAB — CBC WITH DIFFERENTIAL/PLATELET
Basophils Absolute: 0 10*3/uL (ref 0.0–0.1)
Basophils Relative: 0 % (ref 0–1)
Eosinophils Absolute: 0.3 10*3/uL (ref 0.0–0.7)
Eosinophils Relative: 2 % (ref 0–5)
HCT: 43.5 % (ref 36.0–46.0)
Hemoglobin: 14.6 g/dL (ref 12.0–15.0)
Lymphocytes Relative: 39 % (ref 12–46)
Lymphs Abs: 4.6 10*3/uL — ABNORMAL HIGH (ref 0.7–4.0)
MCH: 22.6 pg — ABNORMAL LOW (ref 26.0–34.0)
MCHC: 33.6 g/dL (ref 30.0–36.0)
MCV: 67.2 fL — ABNORMAL LOW (ref 78.0–100.0)
Monocytes Absolute: 0.6 10*3/uL (ref 0.1–1.0)
Monocytes Relative: 5 % (ref 3–12)
Neutro Abs: 6.3 10*3/uL (ref 1.7–7.7)
Neutrophils Relative %: 54 % (ref 43–77)
Platelets: 318 10*3/uL (ref 150–400)
RBC: 6.47 MIL/uL — ABNORMAL HIGH (ref 3.87–5.11)
RDW: 15.7 % — ABNORMAL HIGH (ref 11.5–15.5)
WBC: 11.8 10*3/uL — ABNORMAL HIGH (ref 4.0–10.5)

## 2013-03-06 LAB — POCT URINALYSIS DIPSTICK
Bilirubin, UA: NEGATIVE
Glucose, UA: 250
Ketones, UA: NEGATIVE
Nitrite, UA: NEGATIVE
Protein, UA: NEGATIVE
Spec Grav, UA: 1.015
Urobilinogen, UA: 0.2
pH, UA: 6

## 2013-03-06 LAB — LACTIC ACID, PLASMA: Lactic Acid, Venous: 1.1 mmol/L (ref 0.5–2.2)

## 2013-03-06 LAB — TROPONIN I
Troponin I: <0.3 ng/mL (ref <0.30)
Troponin I: <0.3 ng/mL (ref <0.30)

## 2013-03-06 LAB — GLUCOSE, CAPILLARY
Glucose-Capillary: 505 mg/dL — ABNORMAL HIGH (ref 70–99)
Glucose-Capillary: 279 mg/dL — ABNORMAL HIGH (ref 70–99)

## 2013-03-06 LAB — POCT GLYCOSYLATED HEMOGLOBIN (HGB A1C): Hemoglobin A1C: 8.5

## 2013-03-06 MED ORDER — POTASSIUM CHLORIDE CRYS ER 20 MEQ PO TBCR
40.0000 meq | EXTENDED_RELEASE_TABLET | Freq: Once | ORAL | Status: AC
  Administered 2013-03-07: 40 meq via ORAL
  Filled 2013-03-06 (×2): qty 2

## 2013-03-06 MED ORDER — ACETAMINOPHEN ER 650 MG PO TBCR: 650.0000 mg | EXTENDED_RELEASE_TABLET | Freq: Three times a day (TID) | ORAL | Status: DC | PRN

## 2013-03-06 MED ORDER — INSULIN GLARGINE 100 UNIT/ML ~~LOC~~ SOLN
50.0000 [IU] | Freq: Every day | SUBCUTANEOUS | Status: DC
  Administered 2013-03-06: 50 [IU] via SUBCUTANEOUS

## 2013-03-06 MED ORDER — SODIUM CHLORIDE 0.9 % IJ SOLN
3.0000 mL | Freq: Two times a day (BID) | INTRAMUSCULAR | Status: DC
  Administered 2013-03-06 – 2013-03-08 (×4): 3 mL via INTRAVENOUS

## 2013-03-06 MED ORDER — INSULIN ASPART 100 UNIT/ML ~~LOC~~ SOLN
15.0000 [IU] | Freq: Once | SUBCUTANEOUS | Status: AC
  Administered 2013-03-06: 15 [IU] via SUBCUTANEOUS

## 2013-03-06 MED ORDER — ACETAMINOPHEN 325 MG PO TABS: 650.0000 mg | ORAL_TABLET | Freq: Three times a day (TID) | ORAL | Status: DC | PRN

## 2013-03-06 MED ORDER — SERTRALINE HCL 50 MG PO TABS
150.0000 mg | ORAL_TABLET | Freq: Every day | ORAL | Status: DC
  Administered 2013-03-07 – 2013-03-08 (×2): 150 mg via ORAL
  Filled 2013-03-06 (×2): qty 1

## 2013-03-06 MED ORDER — ASPIRIN EC 81 MG PO TBEC
81.0000 mg | DELAYED_RELEASE_TABLET | Freq: Every morning | ORAL | Status: DC
  Administered 2013-03-07 – 2013-03-08 (×2): 81 mg via ORAL
  Filled 2013-03-06 (×2): qty 1

## 2013-03-06 MED ORDER — SIMVASTATIN 20 MG PO TABS
20.0000 mg | ORAL_TABLET | Freq: Every day | ORAL | Status: DC
  Administered 2013-03-07: 20 mg via ORAL
  Filled 2013-03-06 (×2): qty 1

## 2013-03-06 MED ORDER — ONDANSETRON HCL 4 MG PO TABS: 4.0000 mg | ORAL_TABLET | Freq: Four times a day (QID) | ORAL | Status: DC | PRN

## 2013-03-06 MED ORDER — INSULIN ASPART 100 UNIT/ML ~~LOC~~ SOLN
0.0000 [IU] | Freq: Three times a day (TID) | SUBCUTANEOUS | Status: DC
  Administered 2013-03-07: 8 [IU] via SUBCUTANEOUS
  Administered 2013-03-07: 5 [IU] via SUBCUTANEOUS
  Administered 2013-03-07: 11 [IU] via SUBCUTANEOUS
  Administered 2013-03-08 (×2): 5 [IU] via SUBCUTANEOUS

## 2013-03-06 MED ORDER — INSULIN ASPART 100 UNIT/ML ~~LOC~~ SOLN
0.0000 [IU] | Freq: Every day | SUBCUTANEOUS | Status: DC
  Administered 2013-03-06: 3 [IU] via SUBCUTANEOUS

## 2013-03-06 MED ORDER — SODIUM CHLORIDE 0.9 % IV SOLN
INTRAVENOUS | Status: DC
  Administered 2013-03-07: 01:00:00 via INTRAVENOUS

## 2013-03-06 MED ORDER — ONDANSETRON HCL 4 MG/2ML IJ SOLN: 4.0000 mg | Freq: Four times a day (QID) | INTRAMUSCULAR | Status: DC | PRN

## 2013-03-06 MED ORDER — IPRATROPIUM-ALBUTEROL 20-100 MCG/ACT IN AERS
1.0000 | INHALATION_SPRAY | Freq: Four times a day (QID) | RESPIRATORY_TRACT | Status: DC | PRN
  Filled 2013-03-06: qty 4

## 2013-03-06 MED ORDER — POTASSIUM CHLORIDE CRYS ER 20 MEQ PO TBCR
40.0000 meq | EXTENDED_RELEASE_TABLET | Freq: Once | ORAL | Status: AC
  Administered 2013-03-07: 40 meq via ORAL

## 2013-03-06 MED ORDER — TRAMADOL HCL 50 MG PO TABS
50.0000 mg | ORAL_TABLET | Freq: Two times a day (BID) | ORAL | Status: DC | PRN
  Administered 2013-03-07 (×2): 50 mg via ORAL
  Filled 2013-03-06 (×2): qty 1

## 2013-03-06 MED ORDER — HEPARIN SODIUM (PORCINE) 5000 UNIT/ML IJ SOLN
5000.0000 [IU] | Freq: Three times a day (TID) | INTRAMUSCULAR | Status: DC
  Administered 2013-03-06 – 2013-03-08 (×5): 5000 [IU] via SUBCUTANEOUS
  Filled 2013-03-06 (×8): qty 1

## 2013-03-06 MED ORDER — SODIUM CHLORIDE 0.9 % IV BOLUS (SEPSIS)
1000.0000 mL | Freq: Once | INTRAVENOUS | Status: AC
  Administered 2013-03-07: 1000 mL via INTRAVENOUS

## 2013-03-06 NOTE — Progress Notes (Signed)
Pt transported to 2021 via wheelchair.  Saline lock intact in right hand.  Report called to Nurse on 2000.  Angelina Ok, RN 03/06/2013 6:31 PM.

## 2013-03-06 NOTE — Telephone Encounter (Signed)
Ok, it may be best to have her see Dr. Dorise Hiss today is Dr. Jana Half can arrange this with her for continuity.

## 2013-03-06 NOTE — Progress Notes (Signed)
Subjective:     Patient ID: Susan Holmes, female   DOB: August 27, 1956, 57 y.o.   MRN: [id number]  Shortness of Breath Associated symptoms include leg swelling. Pertinent negatives include no abdominal pain, chest pain, fever, headaches, vomiting or wheezing.  Shoulder Pain  Pertinent negatives include no fever or numbness.  Diabetes Pertinent negatives for hypoglycemia include no dizziness, headaches, seizures, speech difficulty or tremors. Pertinent negatives for diabetes include no chest pain, no fatigue and no weakness.   The patient is a 57 YO female who comes in today for an acute visit for some SOB, diaphoresis that has been going on for off and on for 2+ months. She states that the breathing started getting worse in January. The only change that happened was starting nitroglycerin patches in December for pain. She has gotten lung study and does not have COPD. She states that today and this month she has been having more sweats and she will have an occasional low sugar. She restarted byetta at last visit in January. She is not having chest pain or tightness. She is not having left arm or chin or neck tingling or pain. She is having some SOB. She is not having fevers or chills at home. She is not having any diarrhea or cough. She is not constipated. She is not having sputum production. She is unable to walk a flight of stairs without stopping.   Review of Systems  Constitutional: Positive for diaphoresis and activity change. Negative for fever, chills, appetite change, fatigue and unexpected weight change.  Respiratory: Positive for cough and shortness of breath. Negative for chest tightness and wheezing.        Occasional cough  Cardiovascular: Positive for leg swelling. Negative for chest pain and palpitations.  Gastrointestinal: Negative for nausea, vomiting, abdominal pain and diarrhea.  Musculoskeletal: Positive for myalgias and back pain.  Skin: Negative.   Neurological: Negative for  dizziness, tremors, seizures, syncope, facial asymmetry, speech difficulty, weakness, light-headedness, numbness and headaches.       Some feeling of physical restlessness when mind is calm.  Psychiatric/Behavioral: Negative.        Objective:   Physical Exam  Constitutional: She is oriented to person, place, and time. She appears well-developed and well-nourished. She appears distressed.  HENT:  Head: Normocephalic and atraumatic.  Eyes: EOM are normal. Pupils are equal, round, and reactive to light.  Neck: Normal range of motion. Neck supple.  Cardiovascular: Regular rhythm.   tachycardic  Pulmonary/Chest: Effort normal and breath sounds normal. No respiratory distress. She has no wheezes. She has no rales.  Abdominal: Soft. Bowel sounds are normal. She exhibits no distension and no mass. There is no tenderness. There is no rebound and no guarding.  Musculoskeletal: She exhibits tenderness.  Neurological: She is alert and oriented to person, place, and time. No cranial nerve deficit.  Skin: Skin is warm. She is diaphoretic.  Some beads of sweat along the brow.       Assessment/Plan:   1. Tachycardia/SOB - Pulse initially 116 then increases with walking. Likely related to hyperglycemia and relative dehydration. Will check EKG as this could be atypical presentation of chest pain or ACS. EKG without acute findings and will further check orthostatic vital signs. Concern for chronic PE as recent spirometry was normal with mild restrictive pattern.   2. Hyperglycemia - CBG check is 505 at visit. No nausea, vomiting, abdominal pain. Will give novolog 15 units [place] once and recheck sugar about 15 minutes later. Advised  continued drinking of extra fluids and provided water with ice. Will recheck CBG in clinic prior to admission.  3. Hypercalcemia - Workup should be done and include ionized calcium and PTH as Ca was 11.1.  4. Disposition - Due to sustained tachycardia of unknown origin along  with shortness of breath the patient will be admitted and medicine team to come down to the clinic and see. Would like to rule out ACS with at least one negative troponin. EKG without acute changes.

## 2013-03-06 NOTE — Telephone Encounter (Signed)
Pt calls and states since her last appt at the end of jan 2014 and before that she has periods of weakness, diaphoresis, shortness of breath, tired, denies chest pain, N&V, h/a. Desires to be seen. appt this pm at 1515 dr Lavena Bullion. Again these symptoms are ongoing for 2+ months

## 2013-03-06 NOTE — Progress Notes (Signed)
IV started in rt hand with # 22 angiocath. Saline loc with DSD

## 2013-03-06 NOTE — H&P (Signed)
Date: 03/06/2013               Patient Name:  Susan Holmes MRN: [id number]  DOB: 02-14-1956 Age / Sex: 57 y.o., female   PCP: Judie Bonus, MD              Medical Service: Internal Medicine Teaching Service              Attending Physician: Dr. Lars Mage    First Contact: Dr. Zada Girt Pager: [phone]  Second Contact: Dr. Clyde Lundborg Pager: [phone]              After Hours (After 5p/  First Contact Pager: [phone]  weekends / holidays): Second Contact Pager: [phone]      Chief Complaint: Shortness of breath for 3 months.  History of Present Illness: The patient is a 57 YO female with past medical history of diabetes, depression, who is admitted through the clinic. Patient presented to the clinic with SOB, diaphoresis that has been going on for off and on for 2+ months. She states that the breathing started getting worse in January. The only change that happened was starting nitroglycerin patches in December for pain. She has gotten lung study and does not have COPD. She states that today and this month she has been having more sweats and she will have an occasional low sugar. She restarted byetta at last visit in January. She is not having chest pain or tightness. She is not having left arm or chin or neck tingling or pain, no palpitation. She reports some exercise intolerance. She initially used to walk about 1 mile without difficulty, but now she gets short of breath with walking one block. She denies orthopnea, PND, swelling of her extremities. She is not having fevers or chills at home. She is not having any diarrhea or cough. She is not constipated. She is not having sputum production. She is unable to walk a flight of stairs without stopping. She denies heat intolerance.   Review of Systems: Constitutional:  denies fever, chills, appetite change and fatigue.  HEENT: denies eye pain, redness, hearing loss, ear pain, congestion, sore throat, rhinorrhea.  Respiratory: as noted  above she reports SOB, DOE, cough, chest tightness, and wheezing.  Cardiovascular: denies chest pain, palpitations and leg swelling.  Gastrointestinal: denies nausea, vomiting, abdominal pain, diarrhea, constipation, blood in stool.  Genitourinary: denies dysuria, urgency, frequency, hematuria, flank pain and difficulty urinating.  Musculoskeletal: reports myalgias, back pain, joint swelling, arthralgias and gait problem.   Skin: denies pallor, rash and wound.  Neurological: she sometimes  gets dizziness, but denies seizures, syncope, weakness, light-headedness, numbness and headaches.   Hematological: denies easy bruising, personal or family bleeding history.  Psychiatric: denies suicidal ideation, mood changes, sleep disturbance and agitation.    Current Outpatient Medications: Current Facility-Administered Medications  Medication Dose Route Frequency Provider Last Rate Last Dose  . acetaminophen (TYLENOL) tablet 650 mg  650 mg Oral Q8H PRN Lars Mage, MD      . Melene Muller ON 03/07/2013] aspirin EC tablet 81 mg  81 mg Oral q morning - 10a Maitri S Kalia-Reynolds, DO      . heparin injection 5,000 Units  5,000 Units Subcutaneous Q8H Maitri S Kalia-Reynolds, DO      . insulin glargine (LANTUS) injection 50 Units  50 Units Subcutaneous QHS Maitri S Kalia-Reynolds, DO      . Ipratropium-Albuterol (COMBIVENT) respimat 1 puff  1 puff Inhalation Q6H PRN Maitri S Kalia-Reynolds, DO      .  ondansetron (ZOFRAN) tablet 4 mg  4 mg Oral Q6H PRN Maitri S Kalia-Reynolds, DO       Or  . ondansetron (ZOFRAN) injection 4 mg  4 mg Intravenous Q6H PRN Maitri S Kalia-Reynolds, DO      . [START ON 03/07/2013] sertraline (ZOLOFT) tablet 150 mg  150 mg Oral Daily Maitri S Kalia-Reynolds, DO      . [START ON 03/07/2013] simvastatin (ZOCOR) tablet 20 mg  20 mg Oral q1800 Maitri S Kalia-Reynolds, DO      . sodium chloride 0.9 % injection 3 mL  3 mL Intravenous Q12H Maitri S Kalia-Reynolds, DO      . traMADol (ULTRAM)  tablet 50 mg  50 mg Oral BID PRN Maitri S Kalia-Reynolds, DO        Allergies: Allergies  Allergen Reactions  . Ace Inhibitors Cough     Past Medical History: Past Medical History  Diagnosis Date  . Hepatitis B infection pt unsure    resolved  . Diabetes mellitus, type II   . HTN (hypertension)   . Hyperlipidemia   . Congenital blindness     L eye  . Tobacco abuse     .5 PPD smoker  . Anxiety   . Depression     Past Surgical History: Past Surgical History  Procedure Laterality Date  . Abdominal hysterectomy      Family History: Family History  Problem Relation Age of Onset  . Heart disease Mother 73  . Hypertension Mother   . Heart disease Father 71  . Aneurysm Brother   . Aneurysm Sister   . Colon cancer Neg Hx   . Esophageal cancer Neg Hx   . Stomach cancer Neg Hx     Social History: History   Social History  . Marital Status: Divorced    Spouse Name: N/A    Number of Children: N/A  . Years of Education: N/A   Occupational History  . Not on file.   Social History Main Topics  . Smoking status: Current Every Day Smoker -- 0.50 packs/day    Types: Cigarettes  . Smokeless tobacco: Current User    Types: Chew     Comment: did not want to discuss any stopping or decreasing  . Alcohol Use: No  . Drug Use: No  . Sexually Active: Not Currently    Birth Control/ Protection: Surgical   Other Topics Concern  . Not on file   Social History Narrative  . No narrative on file     Vital Signs:  BP 115/77 mmHg, pulse 135, temp 97.44F, weight 199 pounds. BMI 30.8. 3. Pulse 60, saturation 97% on room air. Respiratory rate 18.   Physical Exam: General: Vital signs reviewed and noted. Well-developed, well-nourished, in no acute distress; alert, appropriate and cooperative throughout examination.  Head: Normocephalic, atraumatic.  Eyes: PERRL, EOMI, No signs of anemia or jaundince. Blindness in th left eye.  Nose: Mucous membranes moist, not inflammed,  nonerythematous.  Throat: Oropharynx nonerythematous, no exudate appreciated.   Neck: No deformities, masses, or tenderness noted. Supple, No carotid Bruits, no JVD.  Lungs:  Normal respiratory effort. Clear to auscultation BL without crackles or wheezes.  Heart: RRR. S1 and S2 normal without gallop, murmur, or rubs.  Abdomen:  BS normoactive. Soft, Nondistended, non-tender.  No masses or organomegaly.  Extremities: No pretibial edema.  Neurologic: A&O X3, CN II - XII are grossly intact. Motor strength is 5/5 in the all 4 extremities, Sensations intact to light touch,  Cerebellar signs negative.  Skin: No visible rashes, scars.   Lab results:  CURRENT LABS: CBC:    Component Value Date/Time   WBC 11.8* 03/06/2013 1653   HGB 14.6 03/06/2013 1653   HCT 43.5 03/06/2013 1653   PLT 318 03/06/2013 1653   MCV 67.2* 03/06/2013 1653   NEUTROABS 6.3 03/06/2013 1653   LYMPHSABS 4.6* 03/06/2013 1653   MONOABS 0.6 03/06/2013 1653   EOSABS 0.3 03/06/2013 1653   BASOSABS 0.0 03/06/2013 1653     Metabolic Panel:    Component Value Date/Time   NA 130* 03/06/2013 1653   K 3.9 03/06/2013 1653   CL 89* 03/06/2013 1653   CO2 23 03/06/2013 1653   BUN 18 03/06/2013 1653   CREATININE 1.32* 03/06/2013 1653   CREATININE 0.80 05/13/2011 1544   GLUCOSE 470* 03/06/2013 1653   CALCIUM 11.8* 03/06/2013 1653   AST 38* 03/06/2013 1653   ALT 47* 03/06/2013 1653   ALKPHOS 101 03/06/2013 1653   BILITOT 0.4 03/06/2013 1653   PROT 8.8* 03/06/2013 1653   ALBUMIN 4.5 03/06/2013 1653     Urinalysis:  Recent Labs  03/06/13 1608  BILIRUBINUR Negative  UROBILINOGEN 0.2  NITRITE Negative  LEUKOCYTESUR Trace     Drugs of Abuse     Component Value Date/Time   LABOPIA NEG 03/28/2012 0955   COCAINSCRNUR NEG 03/28/2012 0955   LABBENZ NEG 03/28/2012 0955   LABBENZ NEG 06/08/2010 2150   AMPHETMU NEG 06/08/2010 2150   LABBARB NEG 03/28/2012 0955     Troponin (Point of Care Test) No results found for this basename: TROPIPOC,  in the  last 72 hours   HISTORICAL LABS: Lab Results  Component Value Date   HGBA1C 8.5 03/06/2013    Lab Results  Component Value Date   CHOL 258* 01/26/2013   HDL 51 01/26/2013   LDLCALC 183* 01/26/2013   TRIG 122 01/26/2013   CHOLHDL 5.1 01/26/2013    Lab Results  Component Value Date   TSH 0.908 01/26/2013    Other results: EKG (03/06/2013) - , normal sinus rhythm, ventricular rate of 114, possible left ventricular enlargement.    Assessment & Plan:  Pt is a 57 y.o. yo female with a with a past medical history of diabetes and depression, who presents through the clinic with shortness of breath and fatigue for over a 2-3 months.  SOB and fatigue: Etiology unclear but concern for chronic pulmonary embolism even though her Geneva score is 5 points for tachycardia which is moderate pretest probability. However, she does not have risk factors.Other pulmonary condition like pulmonary hypertension, sarcoidosis and OSA also possibilities. Her Hgb level is normal at 14.6. Cardiac causes are part of differential even though she denies overt symptoms like PND, or orthopnea and chest pain. Malignancy will also be investigated. She had a normal Echo in 2009. Other possibilities include thyroid dysfunction given her tachycardia. Other vitals seem to be stable at this point. EKG in the clinic is only remarkable for tachycardia of 114 otherwise, no signs of cardiac ischemia.  Plan -Patient will be admitted to a telemetry bed. -TSH levels will be ordered. -Pulmonary VQ scan will be ordered. -Vitamin D levels. - Will consider repeating her echocardiogram is all above tests are non-revealing  Tachycardia: Pulse initially 116 then increases with walking. Likely related to hyperglycemia and relative dehydration but could also be related to chronic PE. Atypical presentation of chest pain or ACS cannot be excluded. EKG on remarkable for sinus tachycardia 114 without ST  or T wave changes. Concern for chronic PE  as recent spirometry was normal with mild restrictive pattern.  Plan. -Evaluation for PE. -TSH, as above.  Acute kidney injury: Baseline creatinine is 0.8 to 0.9. On admission the creatinine is increased to 1.32, which is consistent with acute renal injury. Urinalysis-unremarkable. Possible causes is hyperglycemia with dehydration which can lead to prerenal causes. Plan. -Will monitor renal function.  Anion Gap Metabolic acidosis:  GAP of 18 on initial labs. Delta-delta of 0.3. These findings suggest both Anion gap acidemia and non-anion gap metabolic acidosis. Her degree of renal insufficiency and cannot explain this finding since her creatinine is less than 5. However, this might be related to hyperglycemia. She otherwise appears clinically stable and ABGs have not indicated.  Plan -Rehydration. -Monitor labs.  Diabetes with hyperglycemia: Patient currently on insulin, Lantus, metformin, glipizide, and recently Byetta. CBG in the clinic was 505, however, she reports at home that her blood sugars usually ranges between 150 and the highest she has had is 300. She reports compliance with her medications. Her last hemoglobin A1c was 8.5%.  Plan. -Will continue with Lantus. -Will discontinue metformin for now during inpatient. -We'll put her on sliding scale.  Hypercalcemia: Patient's calcium level is mildly elevated. Baseline level is around 10 and from January 2014 has been a little more elevated to the level of 11.1. Her albumin level is within normal limits. She takes HCTZ will can cause hypercalcemia. This level of hypercalcemia (<12) could not explain her symptoms of fatigue however, it needs further evaluation to rule out primary vs secondary causes. Her level of renal failure can not explain this hypercalcemia.   Plan  - PTH level. If < 20 pg/mL, non-PTH mediated hypercalcemia causes such as vitamin D metabolites, or PTHrp. If more than 20 will consider PTH mediated hypercalcemia and  urinary excretion levels will be determined measured. -PTH independent causes will be evaluated.  Hypertension: Blood pressure is stable at this time. Home medication is hydrochlorothiazide 12.5 mg daily. Will monitor blood pressure.  Depression and Anxiety: Patient reports his symptoms of depression. She is taking amitriptyline 150 mg at bedtime and Zoloft 100 mg. She denies symptoms of suicide. Will continue with this medication. ` Possible COPD: Patient has a long history of heavy cigarette smoking. However, now. She denies history of wheezing, or coughing. She denies sputum production. She reports, that she has not needed to use her Combivent inhaler more frequently than usual. Her most recent PFTs were normal. Will continue with Combivent while in the hospital.  Tobacco abuse: Patient reports ongoing cigarette smoking. Will provide counseling for smoking cessation.  DDpx: Lovenox   Signed:  Dow Adolph, MD  PGY-1, Internal Medicine Resident Pager: [phone] 03/06/2013, 7:17 PM

## 2013-03-07 ENCOUNTER — Encounter: Payer: Self-pay | Admitting: Internal Medicine

## 2013-03-07 ENCOUNTER — Inpatient Hospital Stay (HOSPITAL_COMMUNITY): Payer: Medicaid Other

## 2013-03-07 ENCOUNTER — Observation Stay (HOSPITAL_COMMUNITY): Payer: Medicaid Other

## 2013-03-07 ENCOUNTER — Other Ambulatory Visit: Payer: Self-pay

## 2013-03-07 DIAGNOSIS — R5381 Other malaise: Secondary | ICD-10-CM

## 2013-03-07 DIAGNOSIS — I10 Essential (primary) hypertension: Secondary | ICD-10-CM

## 2013-03-07 DIAGNOSIS — I517 Cardiomegaly: Secondary | ICD-10-CM

## 2013-03-07 DIAGNOSIS — E118 Type 2 diabetes mellitus with unspecified complications: Secondary | ICD-10-CM

## 2013-03-07 DIAGNOSIS — R0602 Shortness of breath: Principal | ICD-10-CM

## 2013-03-07 LAB — GLUCOSE, CAPILLARY
Glucose-Capillary: 456 mg/dL — ABNORMAL HIGH (ref 70–99)
Glucose-Capillary: 320 mg/dL — ABNORMAL HIGH (ref 70–99)
Glucose-Capillary: 222 mg/dL — ABNORMAL HIGH (ref 70–99)
Glucose-Capillary: 274 mg/dL — ABNORMAL HIGH (ref 70–99)
Glucose-Capillary: 172 mg/dL — ABNORMAL HIGH (ref 70–99)

## 2013-03-07 LAB — CBC
HCT: 37.5 % (ref 36.0–46.0)
Hemoglobin: 12.1 g/dL (ref 12.0–15.0)
MCH: 21.3 pg — ABNORMAL LOW (ref 26.0–34.0)
MCHC: 32.3 g/dL (ref 30.0–36.0)
MCV: 66.1 fL — ABNORMAL LOW (ref 78.0–100.0)
Platelets: 251 10*3/uL (ref 150–400)
RBC: 5.67 MIL/uL — ABNORMAL HIGH (ref 3.87–5.11)
RDW: 15.7 % — ABNORMAL HIGH (ref 11.5–15.5)
WBC: 9.4 10*3/uL (ref 4.0–10.5)

## 2013-03-07 LAB — COMPREHENSIVE METABOLIC PANEL
ALT: 36 U/L — ABNORMAL HIGH (ref 0–35)
AST: 31 U/L (ref 0–37)
Albumin: 3.6 g/dL (ref 3.5–5.2)
Alkaline Phosphatase: 79 U/L (ref 39–117)
BUN: 18 mg/dL (ref 6–23)
CO2: 25 mEq/L (ref 19–32)
Calcium: 10.2 mg/dL (ref 8.4–10.5)
Chloride: 99 mEq/L (ref 96–112)
Creatinine, Ser: 1.06 mg/dL (ref 0.50–1.10)
GFR calc Af Amer: 66 mL/min — ABNORMAL LOW (ref >90)
GFR calc non Af Amer: 57 mL/min — ABNORMAL LOW (ref >90)
Glucose, Bld: 334 mg/dL — ABNORMAL HIGH (ref 70–99)
Potassium: 4.1 mEq/L (ref 3.5–5.1)
Sodium: 135 mEq/L (ref 135–145)
Total Bilirubin: 0.4 mg/dL (ref 0.3–1.2)
Total Protein: 7 g/dL (ref 6.0–8.3)

## 2013-03-07 LAB — TROPONIN I
Troponin I: <0.3 ng/mL (ref <0.30)
Troponin I: <0.3 ng/mL (ref <0.30)
Troponin I: <0.3 ng/mL (ref <0.30)
Troponin I: <0.3 ng/mL (ref <0.30)

## 2013-03-07 LAB — T4, FREE: Free T4: 1.21 ng/dL (ref 0.80–1.80)

## 2013-03-07 LAB — TSH: TSH: 3.036 u[IU]/mL (ref 0.350–4.500)

## 2013-03-07 LAB — CALCIUM, IONIZED: Calcium, Ion: 1.45 mmol/L — ABNORMAL HIGH (ref 1.12–1.32)

## 2013-03-07 LAB — PTH, INTACT AND CALCIUM
Calcium, Total (PTH): 11.5 mg/dL — ABNORMAL HIGH (ref 8.4–10.5)
PTH: 109.9 pg/mL — ABNORMAL HIGH (ref 14.0–72.0)

## 2013-03-07 MED ORDER — METOPROLOL TARTRATE 12.5 MG HALF TABLET
12.5000 mg | ORAL_TABLET | Freq: Two times a day (BID) | ORAL | Status: DC
  Administered 2013-03-07 – 2013-03-08 (×3): 12.5 mg via ORAL
  Filled 2013-03-07 (×4): qty 1

## 2013-03-07 MED ORDER — TECHNETIUM TO 99M ALBUMIN AGGREGATED
6.0000 | Freq: Once | INTRAVENOUS | Status: AC | PRN
  Administered 2013-03-07: 6 via INTRAVENOUS

## 2013-03-07 MED ORDER — INSULIN GLARGINE 100 UNIT/ML ~~LOC~~ SOLN: 52.0000 [IU] | Freq: Every day | SUBCUTANEOUS | Status: DC

## 2013-03-07 MED ORDER — TECHNETIUM TC 99M DIETHYLENETRIAME-PENTAACETIC ACID: 40.0000 | Freq: Once | INTRAVENOUS | Status: AC | PRN

## 2013-03-07 MED ORDER — INSULIN GLARGINE 100 UNIT/ML ~~LOC~~ SOLN
55.0000 [IU] | Freq: Every day | SUBCUTANEOUS | Status: DC
  Administered 2013-03-07: 55 [IU] via SUBCUTANEOUS

## 2013-03-07 NOTE — Progress Notes (Signed)
Subjective: No overnight events. The patient still reports fatigue, and shortness of breath, but not at rest. Awaiting a VQ scan today. Chest x-ray unremarkable.  Objective: Vital signs in last 24 hours: Filed Vitals:   03/06/13 2235 03/06/13 2238 03/07/13 0000 03/07/13 0415  BP: 111/75 100/65  113/71  Pulse: 118 127 110 107  Temp:    98.1 F (36.7 C)  TempSrc:    Oral  Resp:    18  Weight:    198 lb 10.2 oz (90.1 kg)  SpO2:   96%    Weight change:   Intake/Output Summary (Last 24 hours) at 03/07/13 1136 Last data filed at 03/07/13 0535  Gross per 24 hour  Intake 1242.5 ml  Output      0 ml  Net 1242.5 ml    General:  Vital signs reviewed and noted. Well-developed, well-nourished, in no acute distress; alert, appropriate and cooperative throughout examination.  Blindness in th left eye.   Lungs:  Normal respiratory effort. Clear to auscultation BL without crackles or wheezes.  Heart:  RRR. S1 and S2 normal without gallop, murmur, or rubs.  Abdomen:  BS normoactive. Soft, Nondistended, non-tender. No masses or organomegaly.  Extremities:  No pretibial edema.  Neurologic:  A&O X3, CN II - XII are grossly intact.   Lab Results: Basic Metabolic Panel:  Recent Labs Lab 03/06/13 2204 03/07/13 0500  NA 135 135  K 3.4* 4.1  CL 94* 99  CO2 26 25  GLUCOSE 280* 334*  BUN 21 18  CREATININE 1.56* 1.06  CALCIUM 12.0* 10.2   Liver Function Tests:  Recent Labs Lab 03/06/13 1653 03/07/13 0500  AST 38* 31  ALT 47* 36*  ALKPHOS 101 79  BILITOT 0.4 0.4  PROT 8.8* 7.0  ALBUMIN 4.5 3.6   CBC:  Recent Labs Lab 03/06/13 1653 03/06/13 2204 03/07/13 0500  WBC 11.8* 11.9* 9.4  NEUTROABS 6.3  --   --   HGB 14.6 14.0 12.1  HCT 43.5 42.5 37.5  MCV 67.2* 66.9* 66.1*  PLT 318 299 251   Cardiac Enzymes:  Recent Labs Lab 03/06/13 1653 03/06/13 2204 03/07/13 0500  TROPONINI <0.30 <0.30 <0.30   CBG:  Recent Labs Lab 03/06/13 1514 03/06/13 1706 03/06/13 1948  03/07/13 0606 03/07/13 1110  GLUCAP 505* 456* 279* 320* 222*   Hemoglobin A1C:  Recent Labs Lab 03/06/13 1600  HGBA1C 8.5    Thyroid Function Tests:  Recent Labs Lab 03/06/13 2204  TSH 3.036  FREET4 1.21   Urine Drug Screen: Drugs of Abuse     Component Value Date/Time   LABOPIA NEG 03/28/2012 0955   COCAINSCRNUR NEG 03/28/2012 0955   LABBENZ NEG 03/28/2012 0955   LABBENZ NEG 06/08/2010 2150   AMPHETMU NEG 06/08/2010 2150   LABBARB NEG 03/28/2012 0955    Urinalysis:  Recent Labs Lab 03/06/13 1608  BILIRUBINUR Negative  UROBILINOGEN 0.2  NITRITE Negative  LEUKOCYTESUR Trace    Micro Results: No results found for this or any previous visit (from the past 240 hour(s)). Studies/Results: X-ray Chest Pa And Lateral   03/07/2013  *RADIOLOGY REPORT*  Clinical Data: Exertional dyspnea  CHEST - 2 VIEW  Comparison: 09/23/2012  Findings: The heart size and mediastinal contours are within normal limits.  Both lungs are clear.  The visualized skeletal structures are unremarkable.  IMPRESSION: No active cardiopulmonary abnormalities.   Original Report Authenticated By: Signa Kell, M.D.    Medications: I have reviewed the patient's current medications. Scheduled Meds: . aspirin  EC  81 mg Oral q morning - 10a  . heparin  5,000 Units Subcutaneous Q8H  . insulin aspart  0-15 Units Subcutaneous TID WC  . insulin aspart  0-5 Units Subcutaneous QHS  . insulin glargine  50 Units Subcutaneous QHS  . metoprolol tartrate  12.5 mg Oral BID  . sertraline  150 mg Oral Daily  . simvastatin  20 mg Oral q1800  . sodium chloride  3 mL Intravenous Q12H   Continuous Infusions: . sodium chloride 150 mL/hr at 03/07/13 0030   PRN Meds:.acetaminophen, Ipratropium-Albuterol, ondansetron (ZOFRAN) IV, ondansetron, traMADol Assessment/Plan: SOB and fatigue: Etiology unclear but concern for chronic pulmonary embolism even though her Geneva score is 5 points for tachycardia which is moderate pretest  probability. However, she does not have risk factors.Other pulmonary condition like pulmonary hypertension, sarcoidosis and OSA also possibilities. Her Hgb level is normal at 14.6. Cardiac causes are part of differential even though she denies overt symptoms like PND, or orthopnea and chest pain. Malignancy will also be investigated. She had a normal Echo in 2009. Other possibilities include thyroid dysfunction given her tachycardia. Other vitals seem to be stable at this point. EKG this morning is revealing Q waves in inferior leads lead III and aVF. There is a possibility of a past silent MI.  Plan  - Echocardiogram - Start metoprol at an initial dose of 12.5 mg twice daily -Pulmonary VQ scan will be ordered.   Tachycardia: Pulse initially 116 then increases with walking. Likely related to hyperglycemia and relative dehydration but could also be related to chronic PE. Atypical presentation of chest pain or ACS cannot be excluded. EKG on remarkable for sinus tachycardia 114 without ST or T wave changes. Concern for chronic PE as recent spirometry was normal with mild restrictive pattern.  Plan.  -Evaluation for PE.  -TSH, as above.   Acute kidney injury -resolved: Baseline creatinine is 0.8 to 0.9. On admission the creatinine is increased to 1.32, which is consistent with acute renal injury. Urinalysis-unremarkable. Possible causes is hyperglycemia with dehydration which can lead to prerenal causes. Repeat creatinine this morning is 1.06. Plan.  -Will monitor renal function.   Anion Gap Metabolic acidosis (resolved): GAP of 18 on initial labs. Delta-delta of 0.3. These findings suggest both Anion gap acidemia and non-anion gap metabolic acidosis. Her degree of renal insufficiency and cannot explain this finding since her creatinine is less than 5. However, this might be related to hyperglycemia. She otherwise appears clinically stable and ABGs have not indicated. Anion gap this morning is 11.    Diabetes with hyperglycemia: Patient currently on insulin, Lantus, metformin, glipizide, and recently Byetta. CBG in the clinic was 505, however, she reports at home that her blood sugars usually ranges between 150 and the highest she has had is 300. She reports compliance with her medications. Her last hemoglobin A1c was 8.5%. CBG is still showing hyperglycemia, but better. Plan.  -Will continue with Lantus at an increased dose of 52 units.  -Will discontinue metformin for now during inpatient.  -We'll put her on sliding scale -Byetta will be discontinued on discharge.   Hypercalcemia: Patient's calcium level is mildly elevated. Baseline level is around 10 and from January 2014 has been a little more elevated to the level of 11.1. Her albumin level is within normal limits. She takes HCTZ will can cause hypercalcemia. This level of hypercalcemia (<12) could not explain her symptoms of fatigue however, it needs further evaluation to rule out primary vs secondary  causes. Her level of renal failure can not explain this hypercalcemia.  Plan  - PTH level. If < 20 pg/mL, non-PTH mediated hypercalcemia causes such as vitamin D metabolites, or PTHrp. If more than 20 will consider PTH mediated hypercalcemia and urinary excretion levels will be determined measured.  -PTH independent causes will be evaluated.   Hypertension: Blood pressure is stable at this time. Home medication is hydrochlorothiazide 12.5 mg daily. Will monitor blood pressure.   Depression and Anxiety: Patient reports his symptoms of depression. She is taking amitriptyline 150 mg at bedtime and Zoloft 100 mg. She denies symptoms of suicide. Will continue with this medication.  `  Possible COPD: Patient has a long history of heavy cigarette smoking. However, now. She denies history of wheezing, or coughing. She denies sputum production. She reports, that she has not needed to use her Combivent inhaler more frequently than usual. Her most  recent PFTs were normal. Will continue with Combivent while in the hospital.   Tobacco abuse: Patient reports ongoing cigarette smoking. Will provide counseling for smoking cessation.  DDpx: Lovenox   Dispo: Disposition is deferred at this time, awaiting improvement of current medical problems.  Anticipated discharge in approximately 2-3 day(s).   The patient does have a current PCP Dorise Hiss, ELIZABETH, MD), therefore will be requiring OPC follow-up after discharge.   The patient does not have transportation limitations that hinder transportation to clinic appointments.  .Services Needed at time of discharge: Y = Yes, Blank = No PT:   OT:   RN:   Equipment:   Other:     LOS: 1 day   Dow Adolph 03/07/2013, 11:36 AM

## 2013-03-07 NOTE — H&P (Signed)
Internal Medicine Attending Admission Note Date: 03/07/2013  Patient name: Susan Holmes Medical record number: [id number] Date of birth: [birth year] Age: 57 y.o. Gender: female  I saw and evaluated the patient. I reviewed the resident's note and I agree with the resident's findings and plan as documented in the resident's note.  Chief Complaint(s):shortness of breath  History - key components related to admission: patient is a 57 year old female with past medical history most significant for diabetes, hypertension, hyperlipidemia, current tobacco use, anxiety and depression who comes in for an acute visitin the clinic for progressive shortness of breath over last 2 months. Patient also notes associated chest pains which have been described as burning, 8/10 at its worse, radiates to the neck, no exacerbating or relieving factors, becoming more frequent recently, not associated with sweating, nausea, vomiting, associated with palpitations. Patient has poor functional status and is able to walk only one block before she gets short of breath and has to rest.  PFT studies done 2 months ago and clinic were negative for COPD.  Patient is complaining of 8/10 chest pain at the time of my visit but otherwise comfortable and walking around in the room. I ordered a 12-lead EKG.  15 point review of system was negative otherwise except what is noted above.  Past medical history, past surgical history, family history, medications and social history was reviewed and as per resident's note.  Physical Exam - key components related to admission:  Filed Vitals:   03/06/13 2235 03/06/13 2238 03/07/13 0000 03/07/13 0415  BP: 111/75 100/65  113/71  Pulse: 118 127 110 107  Temp:    98.1 F (36.7 C)  TempSrc:    Oral  Resp:    18  Weight:    198 lb 10.2 oz (90.1 kg)  SpO2:   96%   Physical Exam: General: Vital signs reviewed and noted. Well-developed, well-nourished, in no acute distress; alert, appropriate  and cooperative throughout examination.  Head: Normocephalic, atraumatic.  Eyes: PERRL, EOMI, No signs of anemia or jaundince.  Nose: Mucous membranes moist, not inflammed, nonerythematous.  Throat: Oropharynx nonerythematous, no exudate appreciated.   Neck: No deformities, masses, or tenderness noted.Supple, No carotid Bruits, no JVD.  Lungs:  Normal respiratory effort. Clear to auscultation BL without crackles or wheezes.  Heart: tachycardia. But regular rhythm, S1 and S2 normal without gallop, murmur, or rubs.  Abdomen:  BS normoactive. Soft, Nondistended, non-tender.  No masses or organomegaly.  Extremities: No pretibial edema.  Neurologic: A&O X3, CN II - XII are grossly intact. Motor strength is 5/5 in the all 4 extremities, Sensations intact to light touch, Cerebellar signs negative.  Skin: No visible rashes, scars.     Lab results:   Basic Metabolic Panel:  Recent Labs  78/29/56 2204 03/07/13 0500  NA 135 135  K 3.4* 4.1  CL 94* 99  CO2 26 25  GLUCOSE 280* 334*  BUN 21 18  CREATININE 1.56* 1.06  CALCIUM 12.0* 10.2   Liver Function Tests:  Recent Labs  03/06/13 1653 03/07/13 0500  AST 38* 31  ALT 47* 36*  ALKPHOS 101 79  BILITOT 0.4 0.4  PROT 8.8* 7.0  ALBUMIN 4.5 3.6   CBC:  Recent Labs  03/06/13 1653 03/06/13 2204 03/07/13 0500  WBC 11.8* 11.9* 9.4  NEUTROABS 6.3  --   --   HGB 14.6 14.0 12.1  HCT 43.5 42.5 37.5  MCV 67.2* 66.9* 66.1*  PLT 318 299 251   Cardiac Enzymes:  Recent  Labs  03/06/13 1653 03/06/13 2204 03/07/13 0500  TROPONINI <0.30 <0.30 <0.30   CBG:  Recent Labs  03/06/13 1514 03/06/13 1706 03/06/13 1948 03/07/13 0606  GLUCAP 505* 456* 279* 320*   Hemoglobin A1C:  Recent Labs  03/06/13 1600  HGBA1C 8.5   Thyroid Function Tests:  Recent Labs  03/06/13 2204  TSH 3.036  FREET4 1.21    Recent Labs  03/06/13 1608  BILIRUBINUR Negative  UROBILINOGEN 0.2  NITRITE Negative  LEUKOCYTESUR Trace   Misc.  Labs: Ionized calcium 1.45(elevated)   Imaging results:  X-ray Chest Pa And Lateral   03/07/2013  *RADIOLOGY REPORT*  Clinical Data: Exertional dyspnea  CHEST - 2 VIEW  Comparison: 09/23/2012  Findings: The heart size and mediastinal contours are within normal limits.  Both lungs are clear.  The visualized skeletal structures are unremarkable.  IMPRESSION: No active cardiopulmonary abnormalities.   Original Report Authenticated By: Signa Kell, M.D.     Other results: EKG:113 beats per minute, normal axis, sinus rhythm, no ST or T wave changes noted, impression: sinus tachycardia  Assessment & Plan by Problem:  Principal Problem:   Shortness of breath Active Problems:   Type II diabetes mellitus with neurological manifestations, uncontrolled   HYPERLIPIDEMIA   ANXIETY   TOBACCO ABUSE   DEPRESSION   HYPERTENSION, ESSENTIAL NOS   Hyperglycemia   Acute kidney injury   Hypercalcemia  Patient is a 57 year old female with past medical history most significant for diabetes, hypertension, hyperlipidemia, current smoker and anxiety and depression who comes in with chief complaints of progressively worsening shortness of breath and chest pain.  Shortness of breath: multifactorial at this time and differentials include, and congestive heart failure( diastolic or systolic), chronic pulmonary embolism, pulmonary hypertension, coronary artery disease. Patient does not have signs and symptoms of systolic congestive heart failure, chronic PE is less likely as patient has not had any features in the past suggestive of hypercoagulability, patient does not have any obstructive lung disease as noted on PFTs and hence pulmonary hypertension isolated in a 57 year old is less likely, coronary artery disease given the risk factors could be the most likely explanation.  -2-D echocardiogram to look for wall motion abnormalities -VQ scan for chronic PE( as ordered by the resident in clinic) -Start patient on  metoprolol( as part of optimal medical therapy for acute coronary syndrome), patient is already on aspirin, statin, nitroglycerin patch -Patient should get an outpatient stress test, the patient has signs of wall motion abnormality or heart failure a 2-D echocardiogram or EKG findings change we should obtain an inpatient cardiology consult for evaluation  Hypercalcemia: hypercalcemia can affect a variety of organ systems and cause symptoms like acute renal failure, dehydration, muscle weakness and other. differentials include primary versus secondary hyperparathyroidism. -we should obtain intact PTH  -follow diagnostic algorithm for diagnoses of causes of hypercalcemia as per up-to-date -continue IV fluids -start definitive treatment as per results of the test  Diabetes type 2: I believe that patient should be continued on sliding scale insulin inpatient. I would discontinue Byetta at discharge.    Rest of the medical management as per resident's note.  Lars Mage MD Faculty-Internal Medicine Residency Program

## 2013-03-07 NOTE — Progress Notes (Signed)
  Echocardiogram 2D Echocardiogram has been performed.  Susan Holmes, Susan Holmes 03/07/2013, 3:43 PM

## 2013-03-08 ENCOUNTER — Other Ambulatory Visit (HOSPITAL_COMMUNITY): Payer: Self-pay | Admitting: Internal Medicine

## 2013-03-08 ENCOUNTER — Encounter (HOSPITAL_COMMUNITY): Payer: Self-pay | Admitting: General Practice

## 2013-03-08 DIAGNOSIS — F411 Generalized anxiety disorder: Secondary | ICD-10-CM

## 2013-03-08 DIAGNOSIS — E1142 Type 2 diabetes mellitus with diabetic polyneuropathy: Secondary | ICD-10-CM

## 2013-03-08 DIAGNOSIS — E1149 Type 2 diabetes mellitus with other diabetic neurological complication: Secondary | ICD-10-CM

## 2013-03-08 DIAGNOSIS — E213 Hyperparathyroidism, unspecified: Secondary | ICD-10-CM | POA: Diagnosis present

## 2013-03-08 DIAGNOSIS — F17200 Nicotine dependence, unspecified, uncomplicated: Secondary | ICD-10-CM

## 2013-03-08 HISTORY — DX: Hyperparathyroidism, unspecified: E21.3

## 2013-03-08 LAB — PHOSPHORUS: Phosphorus: 2.8 mg/dL (ref 2.3–4.6)

## 2013-03-08 LAB — VITAMIN D 25 HYDROXY (VIT D DEFICIENCY, FRACTURES): Vit D, 25-Hydroxy: 19 ng/mL — ABNORMAL LOW (ref 30–89)

## 2013-03-08 LAB — GLUCOSE, CAPILLARY
Glucose-Capillary: 237 mg/dL — ABNORMAL HIGH (ref 70–99)
Glucose-Capillary: 203 mg/dL — ABNORMAL HIGH (ref 70–99)

## 2013-03-08 MED ORDER — METOPROLOL TARTRATE 12.5 MG HALF TABLET: 12.5000 mg | ORAL_TABLET | Freq: Two times a day (BID) | ORAL | Status: DC

## 2013-03-08 MED ORDER — ATORVASTATIN CALCIUM 40 MG PO TABS: 40.0000 mg | ORAL_TABLET | Freq: Every day | ORAL | Status: DC

## 2013-03-08 MED ORDER — INSULIN GLARGINE 100 UNIT/ML ~~LOC~~ SOLN: 55.0000 [IU] | Freq: Every day | SUBCUTANEOUS | Status: DC

## 2013-03-08 NOTE — Progress Notes (Signed)
Subjective: No overnight events. She still reports fatigue with out any change from yesterday.   Objective: Vital signs in last 24 hours: Filed Vitals:   03/07/13 1612 03/07/13 2011 03/08/13 0444 03/08/13 1006  BP:  145/83 117/75 135/86  Pulse:  86 82 79  Temp:  98.3 F (36.8 C) 98.1 F (36.7 C)   TempSrc:  Oral Oral   Resp:  16    Height: 5\' 7"  (1.702 m)     Weight:   198 lb 13.7 oz (90.2 kg)   SpO2:  96% 98%    Weight change: 3.5 oz (0.1 kg)  Intake/Output Summary (Last 24 hours) at 03/08/13 1010 Last data filed at 03/07/13 2300  Gross per 24 hour  Intake   2460 ml  Output      0 ml  Net   2460 ml    General:  Vital signs reviewed and noted. Well-developed, well-nourished, in no acute distress; alert, appropriate and cooperative throughout examination.  Blindness in th left eye.   Lungs:  Normal respiratory effort. Clear to auscultation BL without crackles or wheezes.  Heart:  RRR. S1 and S2 normal without gallop, murmur, or rubs.  Abdomen:  BS normoactive. Soft, Nondistended, non-tender. No masses or organomegaly.  Extremities:  No pretibial edema.  Neurologic:  A&O X3, CN II - XII are grossly intact.   Lab Results: Basic Metabolic Panel:  Recent Labs Lab 03/06/13 2204 03/07/13 0500  NA 135 135  K 3.4* 4.1  CL 94* 99  CO2 26 25  GLUCOSE 280* 334*  BUN 21 18  CREATININE 1.56* 1.06  CALCIUM 12.0* 10.2   Liver Function Tests:  Recent Labs Lab 03/06/13 1653 03/07/13 0500  AST 38* 31  ALT 47* 36*  ALKPHOS 101 79  BILITOT 0.4 0.4  PROT 8.8* 7.0  ALBUMIN 4.5 3.6   CBC:  Recent Labs Lab 03/06/13 1653 03/06/13 2204 03/07/13 0500  WBC 11.8* 11.9* 9.4  NEUTROABS 6.3  --   --   HGB 14.6 14.0 12.1  HCT 43.5 42.5 37.5  MCV 67.2* 66.9* 66.1*  PLT 318 299 251   Cardiac Enzymes:  Recent Labs Lab 03/07/13 1023 03/07/13 1650 03/07/13 2136  TROPONINI <0.30 <0.30 <0.30   CBG:  Recent Labs Lab 03/06/13 1948 03/07/13 0606 03/07/13 1110  03/07/13 1616 03/07/13 2014 03/08/13 0605  GLUCAP 279* 320* 222* 274* 172* 237*   Hemoglobin A1C:  Recent Labs Lab 03/06/13 1600  HGBA1C 8.5    Thyroid Function Tests:  Recent Labs Lab 03/06/13 2204  TSH 3.036  FREET4 1.21   Urine Drug Screen: Drugs of Abuse     Component Value Date/Time   LABOPIA NEG 03/28/2012 0955   COCAINSCRNUR NEG 03/28/2012 0955   LABBENZ NEG 03/28/2012 0955   LABBENZ NEG 06/08/2010 2150   AMPHETMU NEG 06/08/2010 2150   LABBARB NEG 03/28/2012 0955    Urinalysis:  Recent Labs Lab 03/06/13 1608  BILIRUBINUR Negative  UROBILINOGEN 0.2  NITRITE Negative  LEUKOCYTESUR Trace    Micro Results: No results found for this or any previous visit (from the past 240 hour(s)). Studies/Results: X-ray Chest Pa And Lateral   03/07/2013  *RADIOLOGY REPORT*  Clinical Data: Exertional dyspnea  CHEST - 2 VIEW  Comparison: 09/23/2012  Findings: The heart size and mediastinal contours are within normal limits.  Both lungs are clear.  The visualized skeletal structures are unremarkable.  IMPRESSION: No active cardiopulmonary abnormalities.   Original Report Authenticated By: Signa Kell, M.D.  Nm Pulmonary Perf And Vent  03/07/2013  *RADIOLOGY REPORT*  Clinical Data:  Shortness of breath, tachycardia for 2 months, smoking, diabetes, hypertension, question chronic pulmonary embolism  NUCLEAR MEDICINE VENTILATION - PERFUSION LUNG SCAN  Technique:  Ventilation images were obtained in multiple projections using inhaled aerosol technetium 99 M DTPA.  Perfusion images were obtained in multiple projections after intravenous injection of Tc-56m MAA.  Radiopharmaceuticals:  Tc-86m DTPA aerosol and 6 mCi Tc-59m MAA.  Comparison: None Correlation:  Chest radiograph 03/07/2013  Findings:  Ventilation:  No focal ventilatory abnormalities.  Central airway deposition of aerosol noted.  Perfusion:  Normal perfusion throughout both lungs.  No segmental or subsegmental perfusion  defects.  IMPRESSION: Normal ventilation and perfusion lung scan.   Original Report Authenticated By: Ulyses Southward, M.D.    Medications: I have reviewed the patient's current medications. Scheduled Meds: . aspirin EC  81 mg Oral q morning - 10a  . heparin  5,000 Units Subcutaneous Q8H  . insulin aspart  0-15 Units Subcutaneous TID WC  . insulin aspart  0-5 Units Subcutaneous QHS  . insulin glargine  55 Units Subcutaneous QHS  . metoprolol tartrate  12.5 mg Oral BID  . sertraline  150 mg Oral Daily  . simvastatin  20 mg Oral q1800  . sodium chloride  3 mL Intravenous Q12H   Continuous Infusions: . sodium chloride 150 mL/hr at 03/07/13 1800   PRN Meds:.acetaminophen, Ipratropium-Albuterol, ondansetron (ZOFRAN) IV, ondansetron, traMADol Assessment/Plan:  Combined Systolic and Diastolic Heart Failure: The etiology of the patient's symptoms of fatigue and shortness of breath over the last 2-3 month is likely secondary to diastolic heart failure as revealed by echocardiogram performed showing grade 1 diastolic dysfunction, with ejection fraction, which is mildly reduced (50-55%). There was noted basal and mid anterolateral wall were poorly visualized and may have been hypokinetic according to the report. Chronic pulmonary embolism was excluded with a negative pulmonary VQ scan. EKG revealed Q waves in lead to and aVF. Troponins, were negative. Even though her symptoms are subtle and physical exam, is negative for remarkable for cardiovascular findings, it is possible that her fatigue, and shortness of breath is explained by this combined systolic and diastolic heart failure. Other possibilities, of course include her restrictive lung disease, and hypercalcemia. Further evaluation can be pursued as outpatient with a referral to a cardiologist. Outpatient stress test should be considered.   Plan  -  for discharge today  -  metoprol at an initial dose of 12.5 mg twice daily  Tachycardia:  initial  pulse was elevated up to 116 then increases with walking. Likely related to hyperglycemia and relative dehydration. Heart rate improved after rehydration and controlling her hypoglycemia during hospitalization.  EKG on remarkable for sinus tachycardia 114 without ST or T wave changes. No atrial fibrillation. TSH was normal. Metoprolol as prescribed above, is likely to improve her tachycardia.   Plan.  -Evaluation for PE.  -TSH, as above.   Acute kidney injury -resolved: Baseline creatinine is 0.8 to 0.9. On admission the creatinine is increased to 1.32, which is consistent with acute renal injury. Urinalysis-unremarkable. Possible causes is hyperglycemia with dehydration which can lead to prerenal causes. Repeat creatinine this morning is 1.06. Plan.  -Will monitor renal function.   Anion Gap Metabolic acidosis (resolved): GAP of 18 on initial labs. Delta-delta of 0.3. These findings suggest both Anion gap acidemia and non-anion gap metabolic acidosis. Her degree of renal insufficiency and cannot explain this finding since her creatinine  is less than 5. However, this might be related to hyperglycemia. She otherwise appears clinically stable and ABGs have not indicated. Anion gap this morning is 11.   Diabetes with hyperglycemia: Patient currently on insulin, Lantus, metformin, glipizide, and recently Byetta. CBG in the clinic was 505, however, she reports at home that her blood sugars usually ranges between 150 and the highest she has had is 300. She reports compliance with her medications. Her last hemoglobin A1c was 8.5%. CBG way a monitor during hospitalization. On discharge, her Lantus dose will be increased to dose of 52 units.  Plan - she will be discharged on her home regimen.    Hypercalcemia: Patient's calcium level was mildly elevated between 11 to 12. Baseline level is around 10. Her albumin level is within normal limits. She takes HCTZ will can cause hypercalcemia. This level of  hypercalcemia (<12) could not explain her symptoms of fatigue. Her PTH level came back elevated to 109. She will require further evaluation as outpatient to rule out familial hypocalciuric hypercalcemia versus primary hyperparathyroidism. Specifically, she will require outpatient 24 hour urine calcium levels. This will be pursued as outpatient.   Hypertension: Blood pressure is stable at this time. Home medication is hydrochlorothiazide 12.5 mg daily.  metoprolol 12.5 milligrams twice daily was added. This can be titrated as outpatient    Depression and Anxiety: Patient reports his symptoms of depression. She is taking amitriptyline 150 mg at bedtime and Zoloft 100 mg. She denies symptoms of suicide. Will continue with this medication.  `  Possible COPD: Patient has a long history of heavy cigarette smoking. However, now. She denies history of wheezing, or coughing. She denies sputum production. She reports, that she has not needed to use her Combivent inhaler more frequently than usual. Her most recent PFTs were normal. Will continue with Combivent while in the hospital.   Tobacco abuse: Patient reports ongoing cigarette smoking. Will provide counseling for smoking cessation.  DDpx: Lovenox   Dispo: Disposition is deferred at this time, awaiting improvement of current medical problems.  Anticipated discharge in approximately 0 day(s).   The patient does have a current PCP Dorise Hiss, ELIZABETH, MD), therefore will be requiring [facility] follow-up after discharge.   The patient does not have transportation limitations that hinder transportation to clinic appointments.  .Services Needed at time of discharge: Y = Yes, Blank = No PT:   OT:   RN:   Equipment:   Other:     LOS: 2 days   Dow Adolph 03/08/2013, 10:10 AM

## 2013-03-08 NOTE — Progress Notes (Signed)
03/08/2013 12:54 PM Nursing note Discharge avs form, medications already taken today and those due this evening given and explained to patient and family member. Follow up appointments and when to call MD reviewed. Confirmed with Pharmacist ok for pt. To resume metformin this evening per home schedule. Location of called in RX and discontinued medications reviewed as well. Questions and concerns addressed. D/c iv line by NT. D/c tele. D/c home per orders.  Flippin, Blanchard Kelch

## 2013-03-08 NOTE — Discharge Summary (Signed)
Internal Medicine Teaching [facility] Discharge Note  Name: Susan Holmes MRN: [id number] DOB: [birth year] 57 y.o.  Date of Admission: 03/06/2013  6:36 PM Date of Discharge: 03/08/2013 Attending Physician: Lars Mage, MD  Discharge Diagnosis: Principal Problem:   Shortness of breath Active Problems:   Type II diabetes mellitus with neurological manifestations, uncontrolled   HYPERLIPIDEMIA   ANXIETY   TOBACCO ABUSE   DEPRESSION   HYPERTENSION, ESSENTIAL NOS   Hyperglycemia   Acute kidney injury   Hypercalcemia   Hyperparathyroidism   Discharge Medications:   Medication List    STOP taking these medications       pravastatin 40 MG tablet  Commonly known as:  PRAVACHOL      TAKE these medications       ACCU-CHEK AVIVA PLUS W/DEVICE Kit  1 kit by Does not apply route once. diag code 250.62, insulin dependent, please check blood sugars 4 times daily     ACCU-CHEK FASTCLIX LANCETS Misc  1 each by Does not apply route 3 (three) times daily.     acetaminophen 650 MG CR tablet  Commonly known as:  TYLENOL 8 HOUR  Take 1 tablet (650 mg total) by mouth every 8 (eight) hours as needed for pain.     amitriptyline 150 MG tablet  Commonly known as:  ELAVIL  Take 150 mg by mouth at bedtime.     aspirin EC 81 MG tablet  Take 81 mg by mouth every morning.     atorvastatin 40 MG tablet  Commonly known as:  LIPITOR  Take 1 tablet (40 mg total) by mouth daily.     exenatide 5 MCG/0.02ML Soln  Commonly known as:  BYETTA 5 MCG PEN  Inject 0.02 mLs (5 mcg total) into the skin 2 (two) times daily with a meal.     glipiZIDE 5 MG tablet  Commonly known as:  GLUCOTROL  Take 1 tablet (5 mg total) by mouth 2 (two) times daily.     glucose blood test strip  Commonly known as:  EASY TOUCH TEST  Use to test sugars twice daily. Dx Code 250.00     hydrochlorothiazide 12.5 MG capsule  Commonly known as:  MICROZIDE  Take 12.5 mg by mouth daily.     insulin glargine 100  UNIT/ML injection  Commonly known as:  LANTUS  Inject 55 Units into the skin at bedtime.     Insulin Pen Needle 31G X 6 MM Misc  1 each by Does not apply route 4 (four) times daily.     Ipratropium-Albuterol 20-100 MCG/ACT Aers respimat  Commonly known as:  COMBIVENT  Inhale 1 puff into the lungs every 6 (six) hours as needed for wheezing.     meloxicam 7.5 MG tablet  Commonly known as:  MOBIC  Take 1 tablet (7.5 mg total) by mouth daily.     metFORMIN 500 MG tablet  Commonly known as:  GLUCOPHAGE  Take 500 mg by mouth 2 (two) times daily with a meal.     metoprolol tartrate 12.5 mg Tabs  Commonly known as:  LOPRESSOR  Take 0.5 tablets (12.5 mg total) by mouth 2 (two) times daily.     nitroGLYCERIN 0.2 mg/hr  Commonly known as:  NITRODUR - Dosed in mg/24 hr  Place 1 patch onto the skin daily.     sertraline 100 MG tablet  Commonly known as:  ZOLOFT  Take 1.5 tablets (150 mg total) by mouth daily.     traMADol 50 MG  tablet  Commonly known as:  ULTRAM  Take 1 tablet (50 mg total) by mouth 2 (two) times daily as needed for pain.     traZODone 100 MG tablet  Commonly known as:  DESYREL  Take 200 mg by mouth at bedtime.        Disposition and follow-up:   Susan Holmes was discharged from [facility] in Stable condition.  At the hospital follow up visit please address   - Please consider titration of her discharge dose of Metoprolol 12.5 mg twice daily  - Please note that her dose of Lantus was increased to 52 units at bed time - Please consider referral to cardiology as outpatient for further evaluation of her combined heart failure. - Please perform 24 hour urine calcium level for evaluation o and hypercalcemia and hyperparathyroidism - You may perform a recheck BMET for AKI   Follow-up Appointments: Follow-up Information   Follow up with Genella Mech, MD On 03/23/2013. (10:30 am )    Contact information:   [address] [place] [address] [phone]      Discharge Orders   Future Appointments Provider Department Dept Phone   03/23/2013 10:30 AM Judie Bonus, MD [facility] INTERNAL MEDICINE [facility] [phone]   Future Orders Complete By Expires     Call MD for:  difficulty breathing, headache or visual disturbances  As directed     Call MD for:  persistant dizziness or light-headedness  As directed     Call MD for:  temperature >100.4  As directed     Diet - low sodium heart healthy  As directed     Increase activity slowly  As directed        Consultations:    Procedures Performed:  X-ray Chest Pa And Lateral   03/07/2013  *RADIOLOGY REPORT*  Clinical Data: Exertional dyspnea  CHEST - 2 VIEW  Comparison: 09/23/2012  Findings: The heart size and mediastinal contours are within normal limits.  Both lungs are clear.  The visualized skeletal structures are unremarkable.  IMPRESSION: No active cardiopulmonary abnormalities.   Original Report Authenticated By: Signa Kell, M.D.    Nm Pulmonary Perf And Vent  03/07/2013  *RADIOLOGY REPORT*  Clinical Data:  Shortness of breath, tachycardia for 2 months, smoking, diabetes, hypertension, question chronic pulmonary embolism  NUCLEAR MEDICINE VENTILATION - PERFUSION LUNG SCAN  Technique:  Ventilation images were obtained in multiple projections using inhaled aerosol technetium 99 M DTPA.  Perfusion images were obtained in multiple projections after intravenous injection of Tc-53m MAA.  Radiopharmaceuticals:  Tc-43m DTPA aerosol and 6 mCi Tc-79m MAA.  Comparison: None Correlation:  Chest radiograph 03/07/2013  Findings:  Ventilation:  No focal ventilatory abnormalities.  Central airway deposition of aerosol noted.  Perfusion:  Normal perfusion throughout both lungs.  No segmental or subsegmental perfusion defects.  IMPRESSION: Normal ventilation and perfusion lung scan.   Original Report Authenticated By: Ulyses Southward, M.D.     2D Echo:  Impressions:  - Normal LV size with low normal to mildly reduced systolic function, EF 50-55%. The basal to mid anterolateral wall was poorly visualized and may have been hypokinetic. Normal RV size and systolic function. No significant valvular abnormalities.   Admission HPI:  Chief Complaint: Shortness of breath for 3 months.  History of Present Illness:  The patient is a 57 YO female with past medical history of diabetes, depression, who is admitted through the clinic. Patient presented to the  clinic with SOB, diaphoresis that has been going on for off and on for 2+ months. She states that the breathing started getting worse in January. The only change that happened was starting nitroglycerin patches in December for pain. She has gotten lung study and does not have COPD. She states that today and this month she has been having more sweats and she will have an occasional low sugar. She restarted byetta at last visit in January. She is not having chest pain or tightness. She is not having left arm or chin or neck tingling or pain, no palpitation. She reports some exercise intolerance. She initially used to walk about 1 mile without difficulty, but now she gets short of breath with walking one block. She denies orthopnea, PND, swelling of her extremities. She is not having fevers or chills at home. She is not having any diarrhea or cough. She is not constipated. She is not having sputum production. She is unable to walk a flight of stairs without stopping. She denies heat intolerance.  Review of Systems:  Constitutional:  denies fever, chills, appetite change and fatigue.   HEENT:  denies eye pain, redness, hearing loss, ear pain, congestion, sore throat, rhinorrhea.   Respiratory:  as noted above she reports SOB, DOE, cough, chest tightness, and wheezing.   Cardiovascular:  denies chest pain, palpitations and leg swelling.   Gastrointestinal:  denies nausea, vomiting, abdominal pain, diarrhea,  constipation, blood in stool.   Genitourinary:  denies dysuria, urgency, frequency, hematuria, flank pain and difficulty urinating.   Musculoskeletal:  reports myalgias, back pain, joint swelling, arthralgias and gait problem.   Skin:  denies pallor, rash and wound.   Neurological:  she sometimes gets dizziness, but denies seizures, syncope, weakness, light-headedness, numbness and headaches.   Hematological:  denies easy bruising, personal or family bleeding history.   Psychiatric:  denies suicidal ideation, mood changes, sleep disturbance and agitation.      Hospital Course by problem list:  Combined Systolic and Diastolic Heart Failure: The etiology of the patient's symptoms of fatigue and shortness of breath over the last 2-3 month is likely secondary to diastolic heart failure as revealed by echocardiogram performed showing grade 1 diastolic dysfunction, with ejection fraction, which is mildly reduced (50-55%). There was noted basal and mid anterolateral wall were poorly visualized and may have been hypokinetic according to the report. Chronic pulmonary embolism was excluded with a negative pulmonary VQ scan. EKG revealed Q waves in lead to and aVF. Troponins, were negative. Even though her symptoms are subtle and physical exam, is negative for remarkable for cardiovascular findings, it is possible that her fatigue, and shortness of breath is explained by this combined systolic and diastolic heart failure. Other possibilities, of course include her restrictive lung disease, and hypercalcemia. Further evaluation can be pursued as outpatient with a referral to a cardiologist. Outpatient stress test should be considered. She was discharged on metoprol 12.5 mg twice daily. This can be titrated as outpatient. She was encouraged to stop smoking.  Tachycardia:  initial pulse was elevated up to 116 then increases with walking. Likely related to hyperglycemia and relative dehydration. Heart rate improved  after rehydration and controlling her hypoglycemia during hospitalization.  EKG on remarkable for sinus tachycardia 114 without ST or T wave changes. No atrial fibrillation. TSH was normal. Metoprolol as prescribed above, is likely to improve her tachycardia.   Acute kidney injury: Baseline creatinine is 0.8 to 0.9. On admission the creatinine is increased to 1.32, which  was consistent with acute renal injury. Urinalysis-unremarkable. Possible causes was felt to be hyperglycemia with dehydration which can lead to prerenal causes. Repeat creatinine after rehydration and treatment for hyperglycemia improved to 1.06.   Anion Gap Metabolic acidosis: GAP of 18 on initial labs. Delta-delta of 0.3. These findings suggest both Anion gap acidemia and non-anion gap metabolic acidosis. Her degree of renal insufficiency and cannot explain this finding since her creatinine is less than 5. However, this might be related to hyperglycemia. She otherwise appears clinically stable and ABGs have not indicated. Anion gap closed to 11 after one day with IV fluids.   Diabetes with hyperglycemia: Patient currently on insulin, Lantus, metformin, glipizide, and recently Byetta. CBG in the clinic was 505, however, she reports at home that her blood sugars usually ranges between 150 and the highest she has had is 300. She reports compliance with her medications. Her last hemoglobin A1c was 8.5%. CBG way a monitor during hospitalization. On discharge, her Lantus dose will be increased to dose of 52 units otherwise she will continue with her home regimen.  Hypercalcemia: Patient's calcium level was mildly elevated between 11 to 12. Baseline level is around 10. Her albumin level is within normal limits. She takes HCTZ will can cause hypercalcemia. This level of hypercalcemia (<12) could not explain her symptoms of fatigue. Her PTH level came back elevated to 109. She will require further evaluation as outpatient to rule out familial  hypocalciuric hypercalcemia versus primary hyperparathyroidism. Specifically, she will require outpatient 24 hour urine calcium levels. This will be pursued as outpatient.   Hypertension: Blood pressure is stable at this time. Home medication is hydrochlorothiazide 12.5 mg daily.  metoprolol 12.5 milligrams twice daily was added. This can be titrated as outpatient    Depression and Anxiety: Patient reports his symptoms of depression. She is taking amitriptyline 150 mg at bedtime and Zoloft 100 mg. She denies symptoms of suicide. She was discharged on home medication.      Discharge Vitals:  BP 135/86  Pulse 79  Temp(Src) 98.1 F (36.7 C) (Oral)  Resp 16  Ht 5\' 7"  (1.702 m)  Wt 198 lb 13.7 oz (90.2 kg)  BMI 31.14 kg/m2  SpO2 98%  Discharge Labs:  No results found for this or any previous visit (from the past 24 hour(s)).  Signed: Dow Adolph 03/09/2013, 12:02 PM   Time Spent on Discharge:45 minutes Services Ordered on Discharge: none Equipment Ordered on Discharge: none

## 2013-03-08 NOTE — Telephone Encounter (Signed)
She is in the hospital. If they are rxing her new medicines they need to send in those rxes. Please direct to the appropriate person.  Dr. Dorise Hiss

## 2013-03-11 ENCOUNTER — Encounter: Payer: Self-pay | Admitting: Internal Medicine

## 2013-03-11 DIAGNOSIS — E559 Vitamin D deficiency, unspecified: Secondary | ICD-10-CM | POA: Insufficient documentation

## 2013-03-23 ENCOUNTER — Encounter: Payer: Self-pay | Admitting: Internal Medicine

## 2013-03-23 ENCOUNTER — Ambulatory Visit (INDEPENDENT_AMBULATORY_CARE_PROVIDER_SITE_OTHER): Payer: Medicaid Other | Admitting: Internal Medicine

## 2013-03-23 VITALS — BP 135/91 | HR 86 | Temp 98.2°F | Wt 205.0 lb

## 2013-03-23 DIAGNOSIS — F17200 Nicotine dependence, unspecified, uncomplicated: Secondary | ICD-10-CM

## 2013-03-23 DIAGNOSIS — E1149 Type 2 diabetes mellitus with other diabetic neurological complication: Secondary | ICD-10-CM

## 2013-03-23 DIAGNOSIS — E0590 Thyrotoxicosis, unspecified without thyrotoxic crisis or storm: Secondary | ICD-10-CM

## 2013-03-23 DIAGNOSIS — I10 Essential (primary) hypertension: Secondary | ICD-10-CM

## 2013-03-23 DIAGNOSIS — E559 Vitamin D deficiency, unspecified: Secondary | ICD-10-CM

## 2013-03-23 DIAGNOSIS — E1142 Type 2 diabetes mellitus with diabetic polyneuropathy: Secondary | ICD-10-CM

## 2013-03-23 DIAGNOSIS — E213 Hyperparathyroidism, unspecified: Secondary | ICD-10-CM

## 2013-03-23 LAB — BASIC METABOLIC PANEL WITH GFR
BUN: 9 mg/dL (ref 6–23)
CO2: 23 mEq/L (ref 19–32)
Calcium: 10.5 mg/dL (ref 8.4–10.5)
Chloride: 102 mEq/L (ref 96–112)
Creat: 0.85 mg/dL (ref 0.50–1.10)
GFR, Est African American: 87 mL/min
GFR, Est Non African American: 76 mL/min
Glucose, Bld: 272 mg/dL — ABNORMAL HIGH (ref 70–99)
Potassium: 4.8 mEq/L (ref 3.5–5.3)
Sodium: 137 mEq/L (ref 135–145)

## 2013-03-23 MED ORDER — VITAMIN D (ERGOCALCIFEROL) 1.25 MG (50000 UNIT) PO CAPS: 50000.0000 [IU] | ORAL_CAPSULE | ORAL | Status: DC

## 2013-03-23 MED ORDER — LOSARTAN POTASSIUM 50 MG PO TABS: 50.0000 mg | ORAL_TABLET | Freq: Every day | ORAL | Status: DC

## 2013-03-23 NOTE — Patient Instructions (Addendum)
Come back in 6 weeks to check on your blood pressure and to talk about the results of your bone scan.  We would like you to get the bone scan to help [place] figure out your calcium levels and if you may benefit from surgery. We would like you to take some calcium over the counter. We will send in a prescription for some vitamin D. Take 1 pill every week until you return.  We have stopped one blood pressure medicine and will replace it with another called losartan. Take 1 pill per day.  Our number is [phone] if you have questions or problems.

## 2013-03-23 NOTE — Assessment & Plan Note (Signed)
Lab Results  Component Value Date   HGBA1C 8.5 03/06/2013   HGBA1C 10.4 11/16/2012   HGBA1C 12.0 08/01/2012     Assessment:  Diabetes control: fair control  Progress toward A1C goal:  unchanged  Comments:   Plan:  Medications:  continue current medications, lantus 55 units nightly, byetta BID, metformin 1000 mg BID, glipizide 5 mg BID  Home glucose monitoring:   Frequency: 3 times a day   Timing: before breakfast;after dinner;before dinner  Instruction/counseling given: reminded to get eye exam, reminded to bring medications to each visit, discussed foot care and discussed diet  Educational resources provided: brochure  Self management tools provided: instructions for home glucose monitoring  Other plans:

## 2013-03-23 NOTE — Assessment & Plan Note (Signed)
With increased calcium and elevated PTH (109) likely to be primary hyperparathyroidism. Will check DEXA scan to stratify to medical versus surgical therapy. Will treat low vitamin D levels.

## 2013-03-23 NOTE — Assessment & Plan Note (Signed)
BP Readings from Last 3 Encounters:  03/23/13 135/91  03/08/13 135/86  03/06/13 115/77    Lab Results  Component Value Date   NA 135 03/07/2013   K 4.1 03/07/2013   CREATININE 1.06 03/07/2013    Assessment:  Blood pressure control: mildly elevated  Progress toward BP goal:  unchanged  Comments:   Plan:  Medications:  Continue metoprolol 12.5 mg BID, STOP HCTZ, start losartan 50 mg daily  Educational resources provided: brochure  Self management tools provided: home blood pressure logbook  Other plans:

## 2013-03-23 NOTE — Progress Notes (Signed)
Subjective:     Patient ID: Susan Holmes, female   DOB: [birth year], 57 y.o.   MRN: [id number]  Diabetes Pertinent negatives for hypoglycemia include no dizziness, headaches, seizures, speech difficulty or tremors. Pertinent negatives for diabetes include no chest pain, no fatigue and no weakness.   The patient is a 57 year old female who is here today for a hospital followup. She does have past medical history significant for type 2 diabetes, tobacco abuse, hypertension, newly diagnosed primary hyperparathyroidism, hyperlipidemia, hypercalcemia, vitamin D deficiency. She states that she's been doing about the same since leaving the hospital and has not had any problems with falling. She's not had any problems with her medications. She still occasionally gets short of breath with increased activity. She does have pattern of some restrictive lung disease on PFTs. She does not have COPD. She states she is having some dry mouth in the morning and has been for several months. It is unchanged. Once she starts drinking liquids during the day it is resolved and does not trouble her. She has no other needs or requests at today's visit. She's not had any chest pain, diaphoresis, left arm numbness or tingling. She's not had any swelling in her legs. She's not had any change in her weight.  Review of Systems  Constitutional: Negative for fever, chills, diaphoresis, activity change, appetite change, fatigue and unexpected weight change.  Respiratory: Negative for cough, chest tightness, shortness of breath and wheezing.        Occasional cough  Cardiovascular: Negative for chest pain, palpitations and leg swelling.  Gastrointestinal: Negative for nausea, vomiting, abdominal pain and diarrhea.  Musculoskeletal: Positive for myalgias and back pain.  Skin: Negative.   Neurological: Negative for dizziness, tremors, seizures, syncope, facial asymmetry, speech difficulty, weakness, light-headedness, numbness and  headaches.  Psychiatric/Behavioral: Negative.        Objective:   Physical Exam  Constitutional: She is oriented to person, place, and time. She appears well-developed and well-nourished. No distress.  HENT:  Head: Normocephalic and atraumatic.  Eyes: EOM are normal. Pupils are equal, round, and reactive to light.  Neck: Normal range of motion. Neck supple.  Cardiovascular: Normal rate and regular rhythm.   Pulmonary/Chest: Effort normal and breath sounds normal. No respiratory distress. She has no wheezes. She has no rales.  Abdominal: Soft. Bowel sounds are normal. She exhibits no distension and no mass. There is no tenderness. There is no rebound and no guarding.  Musculoskeletal: She exhibits tenderness.  Neurological: She is alert and oriented to person, place, and time. No cranial nerve deficit.  Skin: Skin is warm and dry. She is not diaphoretic.       Assessment/Plan:   1. Please see problem-oriented charting.  2. Disposition- the patient will have basic metabolic panel at today's visit. She was given prescription for vitamin D 50,000 units weekly until she returns. At that time we will recheck a vitamin D level. We will not make changes to her diabetic regimen. Stop HCTZ. Start losartan 50 mg daily. She will need recheck basic metabolic panel at next visit. She will get a DEXA scan. She will do physical therapy. Request for handicap parking sticker was declined at today's visit. Patient will decrease amitriptyline to 75 mg nightly to see if this helps with her dry mouth in the morning.

## 2013-03-23 NOTE — Assessment & Plan Note (Signed)
Patient prescribed weekly vitamin D treatment and will recheck a level in 6 weeks when she returns.

## 2013-03-23 NOTE — Assessment & Plan Note (Signed)
  Assessment:  Progress toward smoking cessation:  smoking the same amount  Barriers to progress toward smoking cessation:  lack of motivation to quit  Comments:   Plan:  Instruction/counseling given:  I advised patient to stop smoking.  Educational resources provided:  [employer]) brochure  Self management tools provided:  smoking cessation plan (STAR Quit Plan)  Medications to assist with smoking cessation:  none Patient agreed to the following self-care plans for smoking cessation:  go to the [employer] ([employer])   Other:

## 2013-03-26 ENCOUNTER — Ambulatory Visit: Payer: Medicaid Other | Admitting: Internal Medicine

## 2013-04-04 ENCOUNTER — Ambulatory Visit: Payer: Medicaid Other | Admitting: Physical Therapy

## 2013-04-09 ENCOUNTER — Ambulatory Visit: Payer: Medicaid Other | Attending: Internal Medicine | Admitting: Physical Therapy

## 2013-04-09 DIAGNOSIS — M25519 Pain in unspecified shoulder: Secondary | ICD-10-CM | POA: Insufficient documentation

## 2013-04-09 DIAGNOSIS — M545 Low back pain, unspecified: Secondary | ICD-10-CM | POA: Insufficient documentation

## 2013-04-09 DIAGNOSIS — IMO0001 Reserved for inherently not codable concepts without codable children: Secondary | ICD-10-CM | POA: Insufficient documentation

## 2013-04-25 ENCOUNTER — Ambulatory Visit: Payer: Medicaid Other | Admitting: Physical Therapy

## 2013-04-26 ENCOUNTER — Other Ambulatory Visit: Payer: Self-pay | Admitting: Internal Medicine

## 2013-04-26 DIAGNOSIS — E1149 Type 2 diabetes mellitus with other diabetic neurological complication: Secondary | ICD-10-CM

## 2013-04-27 ENCOUNTER — Encounter: Payer: Medicaid Other | Admitting: Internal Medicine

## 2013-05-02 ENCOUNTER — Ambulatory Visit: Payer: Medicaid Other | Admitting: Physical Therapy

## 2013-05-04 ENCOUNTER — Ambulatory Visit (INDEPENDENT_AMBULATORY_CARE_PROVIDER_SITE_OTHER): Payer: Medicaid Other | Admitting: Internal Medicine

## 2013-05-04 ENCOUNTER — Encounter: Payer: Self-pay | Admitting: Internal Medicine

## 2013-05-04 VITALS — BP 125/87 | HR 100 | Temp 97.8°F | Ht 67.0 in | Wt 196.3 lb

## 2013-05-04 DIAGNOSIS — E213 Hyperparathyroidism, unspecified: Secondary | ICD-10-CM

## 2013-05-04 DIAGNOSIS — I10 Essential (primary) hypertension: Secondary | ICD-10-CM

## 2013-05-04 DIAGNOSIS — E1142 Type 2 diabetes mellitus with diabetic polyneuropathy: Secondary | ICD-10-CM

## 2013-05-04 DIAGNOSIS — E1149 Type 2 diabetes mellitus with other diabetic neurological complication: Secondary | ICD-10-CM

## 2013-05-04 DIAGNOSIS — F329 Major depressive disorder, single episode, unspecified: Secondary | ICD-10-CM

## 2013-05-04 DIAGNOSIS — F17200 Nicotine dependence, unspecified, uncomplicated: Secondary | ICD-10-CM

## 2013-05-04 DIAGNOSIS — E119 Type 2 diabetes mellitus without complications: Secondary | ICD-10-CM

## 2013-05-04 DIAGNOSIS — E559 Vitamin D deficiency, unspecified: Secondary | ICD-10-CM

## 2013-05-04 LAB — POCT GLYCOSYLATED HEMOGLOBIN (HGB A1C): Hemoglobin A1C: 8.4

## 2013-05-04 LAB — GLUCOSE, CAPILLARY: Glucose-Capillary: 113 mg/dL — ABNORMAL HIGH (ref 70–99)

## 2013-05-04 MED ORDER — INSULIN ASPART 100 UNIT/ML FLEXPEN: PEN_INJECTOR | SUBCUTANEOUS | Status: DC

## 2013-05-04 NOTE — Patient Instructions (Addendum)
General Instructions:  STOP GLIPIZIDE. STOP BYETTA.  Start novolog take 7 units with your meal. Be sure to measure your blood sugar 30 minutes after your meal and before if you can. If you can only do one then measure 30 minutes after your meal. If you are having low sugars please stop taking novolog and call the clinic. We may need to make changes to your dose and will have you come back with your meter in 4 weeks.   Call with problems or questions before then at [phone]. Please get your bone density scan as scheduled. We will have you go talk to [facility] our diabetic educator about food choices and about starting insulin with meals.   Treatment Goals:  Goals (1 Years of Data) as of 05/04/13         As of Today 03/23/13 03/08/13 03/08/13 03/07/13     Blood Pressure    . Blood Pressure < 140/90  125/87 135/91 135/86 117/75 145/83     Diet    . Have 3 meals a day           Result Component    . HEMOGLOBIN A1C < 7.0  8.4        . LDL CALC < 100            Progress Toward Treatment Goals:  Treatment Goal 05/04/2013  Hemoglobin A1C unchanged  Blood pressure at goal  Stop smoking smoking the same amount    Self Care Goals & Plans:  Self Care Goal 05/04/2013  Manage my medications take my medicines as prescribed  Monitor my health keep track of my blood glucose  Eat healthy foods eat more vegetables  Be physically active find an activity I enjoy  Stop smoking -  Meeting treatment goals maintain the current self-care plan    Home Blood Glucose Monitoring 05/04/2013  Check my blood sugar 3 times a day  When to check my blood sugar before meals; before breakfast; at bedtime     Care Management & Community Referrals:  Referral 05/04/2013  Referrals made for care management support diabetes educator  Referrals made to community resources -

## 2013-05-05 LAB — BASIC METABOLIC PANEL WITH GFR
BUN: 7 mg/dL (ref 6–23)
CO2: 26 mEq/L (ref 19–32)
Calcium: 10.4 mg/dL (ref 8.4–10.5)
Chloride: 107 mEq/L (ref 96–112)
Creat: 0.93 mg/dL (ref 0.50–1.10)
GFR, Est African American: 78 mL/min
GFR, Est Non African American: 68 mL/min
Glucose, Bld: 60 mg/dL — ABNORMAL LOW (ref 70–99)
Potassium: 4.2 mEq/L (ref 3.5–5.3)
Sodium: 141 mEq/L (ref 135–145)

## 2013-05-05 NOTE — Assessment & Plan Note (Signed)
  Assessment: Progress toward smoking cessation:  smoking the same amount Barriers to progress toward smoking cessation:  lack of motivation to quit Comments:   Plan: Instruction/counseling given:  I advised patient to stop smoking. Educational resources provided:  [employer]) brochure Self management tools provided:  smoking cessation plan (STAR Quit Plan) Medications to assist with smoking cessation:  None Patient agreed to the following self-care plans for smoking cessation:    Other plans:

## 2013-05-05 NOTE — Assessment & Plan Note (Addendum)
Lab Results  Component Value Date   HGBA1C 8.4 05/04/2013   HGBA1C 8.5 03/06/2013   HGBA1C 10.4 11/16/2012     Assessment: Diabetes control: fair control Progress toward A1C goal:  unchanged Comments:   Plan: Medications:  stop glipizide, stop byetta, start novolog 7 units with meal, continue lantus 55 units nightly.  Home glucose monitoring: Frequency: 3 times a day Timing: before meals;before breakfast;at bedtime Instruction/counseling given: reminded to bring blood glucose meter & log to each visit Educational resources provided: brochure Self management tools provided: copy of home glucose meter download Other plans: reminded to increase exercise tolerance by walking more, reminded to check sugars before and after giving novolog to herself.

## 2013-05-05 NOTE — Assessment & Plan Note (Signed)
Reminded patient the importance of getting a bone density scan so we can decide on course of treatment. Will recheck BMP today.

## 2013-05-05 NOTE — Assessment & Plan Note (Signed)
She has been taking Vitamin D 95621 units weekly for 6 weeks. Will recheck vitamin D level today and if more replete will switch to oral daily vitamin D.

## 2013-05-05 NOTE — Assessment & Plan Note (Addendum)
BP Readings from Last 3 Encounters:  05/04/13 125/87  03/23/13 135/91  03/08/13 135/86    Lab Results  Component Value Date   NA 141 05/04/2013   K 4.2 05/04/2013   CREATININE 0.93 05/04/2013    Assessment: Blood pressure control: controlled Progress toward BP goal:  at goal Comments:   Plan: Medications:  continue cozaar 50 mg daily, lopressor 12.5 mg bid Educational resources provided: brochure Self management tools provided: instructions for home blood pressure monitoring Other plans: recheck BMP today after switching from ACE-I to ARB

## 2013-05-05 NOTE — Progress Notes (Signed)
Subjective:     Patient ID: AMILY DEPP, female   DOB: 1956/02/17, 57 y.o.   MRN: [id number]  HPI The patient is a 57 YO female who comes into the clinic today for a routine follow up visit. Since switching from ACE-I to ARB she feels much better and is not having a cough anymore. She has been taking the vitamin D pills every week since our last visit. She is still having not much energy and has noticed that her sugars have still been mostly in the 100s although she only checks her sugars in the morning. She is not having any hypoglycemic episodes or chills. She does not have any sleep problems and still finds enjoyment in everyday activities. She continues to smoke the same amount. She is not having abdominal pain or nausea or vomiting. She is not having any chest pain. She still has decreased exercise tolerance although feels that her inhaler helps her some. She has not really been trying to exercise. She states that she has been taking her medications as instructed. She has not gotten the bone density scan as ordered since last visit and states she thought she was supposed to wait for someone to contact her and they never did. Since decreasing her amitriptyline from 150 mg at night to 75 mg at night she has noticed appreciable benefit and no longer has dry mouth in the morning. She continues to take lantus 55 units at night time and her morning sugars are usually around 100-130.   Review of Systems  Constitutional: Negative for fever, chills, diaphoresis, activity change, appetite change, fatigue and unexpected weight change.  Respiratory: Negative for cough, chest tightness, shortness of breath and wheezing.   Cardiovascular: Negative for chest pain, palpitations and leg swelling.  Gastrointestinal: Negative for nausea, vomiting, abdominal pain and diarrhea.  Musculoskeletal: Positive for back pain. Negative for myalgias.  Skin: Negative.   Neurological: Negative for dizziness, tremors, seizures,  syncope, facial asymmetry, speech difficulty, weakness, light-headedness, numbness and headaches.  Psychiatric/Behavioral: Negative.        Objective:   Physical Exam  Constitutional: She is oriented to person, place, and time. She appears well-developed and well-nourished. No distress.  HENT:  Head: Normocephalic and atraumatic.  Eyes: EOM are normal. Pupils are equal, round, and reactive to light.  Neck: Normal range of motion. Neck supple.  Cardiovascular: Normal rate and regular rhythm.   Pulmonary/Chest: Effort normal and breath sounds normal. No respiratory distress. She has no wheezes. She has no rales. She exhibits no tenderness.  Abdominal: Soft. Bowel sounds are normal. She exhibits no distension and no mass. There is no tenderness. There is no rebound and no guarding.  Musculoskeletal: She exhibits no tenderness.  Neurological: She is alert and oriented to person, place, and time. No cranial nerve deficit.  Skin: Skin is warm and dry. She is not diaphoretic.       Assessment/Plan:   1. Please see problem oriented charting.  2. Disposition - The patient will go to bone density scan to determine whether she is fit for medical or surgical therapy for her primary hyperparathyroidism. She has been advised that she should get a colonoscopy and is uninterested at this time. She will stop glipizide and stop byetta. She will start taking novolog 7 units with her largest meal of the day. She was advised that she will need to check her blood sugar 30 minutes after eating and taking insulin and if she is having low sugars she will  need to call us and adjustments can be made. She will meet with our diabetic educator and return in 4 weeks for close follow up. She does not wish to quit smoking at this time. We will recheck vitamin D level and BMP at today's visit.

## 2013-05-05 NOTE — Assessment & Plan Note (Signed)
She does seem to be reasonably well controlled and is sleeping well, having enjoyment. Unclear if some of her fatigue symptoms are coming from her increased calcium or from her depression.

## 2013-05-09 LAB — VITAMIN D 1,25 DIHYDROXY
Vitamin D 1, 25 (OH)2 Total: 45 pg/mL (ref 18–72)
Vitamin D2 1, 25 (OH)2: 32 pg/mL
Vitamin D3 1, 25 (OH)2: 13 pg/mL

## 2013-05-09 NOTE — Progress Notes (Signed)
Case discussed with Dr. Kollar immediately after the resident saw the patient. We reviewed the resident's history and exam and pertinent patient test results. I agree with the assessment, diagnosis and plan of care documented in the resident's note. 

## 2013-05-17 ENCOUNTER — Ambulatory Visit
Admission: RE | Admit: 2013-05-17 | Discharge: 2013-05-17 | Disposition: A | Discharge status: Home / Self Care | Payer: Medicaid Other | Source: Ambulatory Visit | Attending: Internal Medicine | Admitting: Internal Medicine

## 2013-05-17 ENCOUNTER — Telehealth: Payer: Self-pay | Admitting: *Deleted

## 2013-05-17 DIAGNOSIS — E213 Hyperparathyroidism, unspecified: Secondary | ICD-10-CM

## 2013-05-17 NOTE — Telephone Encounter (Signed)
Pt called with c/o pain to left ear.  She is having shooting pain and feeling of being stopped up.   Onset 1 week ago, staying about the same.  Denies fever or drainage. We will not be able to see pt until 5/27 so advised [facility] visit for evaluation. Pt voices understanding.

## 2013-05-28 ENCOUNTER — Telehealth: Payer: Self-pay | Admitting: *Deleted

## 2013-05-28 ENCOUNTER — Other Ambulatory Visit: Payer: Self-pay | Admitting: Internal Medicine

## 2013-05-28 NOTE — Telephone Encounter (Signed)
Pt calls c/o ear/ eye swelling, redness, discomfort, "stopped up" x 2 weeks, desires appt. Denies fever, drainage. appt 6/6 1315 dr brown

## 2013-06-01 ENCOUNTER — Encounter: Payer: Self-pay | Admitting: Internal Medicine

## 2013-06-01 ENCOUNTER — Ambulatory Visit (INDEPENDENT_AMBULATORY_CARE_PROVIDER_SITE_OTHER): Payer: Medicare Other | Admitting: Dietician

## 2013-06-01 ENCOUNTER — Ambulatory Visit (INDEPENDENT_AMBULATORY_CARE_PROVIDER_SITE_OTHER): Payer: Medicaid Other | Admitting: Internal Medicine

## 2013-06-01 VITALS — BP 135/92 | HR 95 | Temp 97.7°F | Ht 65.0 in

## 2013-06-01 DIAGNOSIS — E1149 Type 2 diabetes mellitus with other diabetic neurological complication: Secondary | ICD-10-CM

## 2013-06-01 DIAGNOSIS — H6120 Impacted cerumen, unspecified ear: Secondary | ICD-10-CM

## 2013-06-01 DIAGNOSIS — E1165 Type 2 diabetes mellitus with hyperglycemia: Secondary | ICD-10-CM

## 2013-06-01 DIAGNOSIS — H1010 Acute atopic conjunctivitis, unspecified eye: Secondary | ICD-10-CM | POA: Insufficient documentation

## 2013-06-01 DIAGNOSIS — H1011 Acute atopic conjunctivitis, right eye: Secondary | ICD-10-CM

## 2013-06-01 DIAGNOSIS — H6122 Impacted cerumen, left ear: Secondary | ICD-10-CM

## 2013-06-01 DIAGNOSIS — H1045 Other chronic allergic conjunctivitis: Secondary | ICD-10-CM

## 2013-06-01 DIAGNOSIS — E1142 Type 2 diabetes mellitus with diabetic polyneuropathy: Secondary | ICD-10-CM

## 2013-06-01 LAB — GLUCOSE, CAPILLARY: Glucose-Capillary: 209 mg/dL — ABNORMAL HIGH (ref 70–99)

## 2013-06-01 MED ORDER — EXENATIDE 5 MCG/0.02ML ~~LOC~~ SOPN: 5.0000 ug | PEN_INJECTOR | Freq: Two times a day (BID) | SUBCUTANEOUS | Status: DC

## 2013-06-01 MED ORDER — EXENATIDE 5 MCG/0.02ML ~~LOC~~ SOPN: 10.0000 ug | PEN_INJECTOR | Freq: Two times a day (BID) | SUBCUTANEOUS | Status: DC

## 2013-06-01 MED ORDER — OLOPATADINE HCL 0.1 % OP SOLN: 1.0000 [drp] | Freq: Two times a day (BID) | OPHTHALMIC | Status: DC

## 2013-06-01 NOTE — Patient Instructions (Addendum)
General Instructions: For your diabetes: -continue using Lantus (long-acting insulin) 55 units per day -continue using Byetta, increase your usage to 10 mcg, 1 injection twice per day -STOP using Novolog (short-acting insulin)  Your eye redness is likely due to a condition called Allergic Conjunctivitis.  Stop using Visine.  Start using Patanol, 1 drop in your right eye twice per day.  Your ear pain is due to earwax impaction.  We will address this today.  Please return for a follow-up visit for your diabetes in 4 weeks.   Treatment Goals:  Goals (1 Years of Data) as of 06/01/13         As of Today 05/04/13 03/23/13 03/08/13 03/08/13     Blood Pressure    . Blood Pressure < 140/90  135/92 125/87 135/91 135/86 117/75     Diet    . Have 3 meals a day           Result Component    . HEMOGLOBIN A1C < 7.0   8.4       . LDL CALC < 100            Progress Toward Treatment Goals:  Treatment Goal 06/01/2013  Hemoglobin A1C unchanged  Blood pressure at goal  Stop smoking -    Self Care Goals & Plans:  Self Care Goal 06/01/2013  Manage my medications take my medicines as prescribed  Monitor my health keep track of my blood glucose  Eat healthy foods eat more vegetables  Be physically active find an activity I enjoy  Stop smoking go to the [employer] ([email or web address])  Meeting treatment goals -    Home Blood Glucose Monitoring 05/04/2013  Check my blood sugar 3 times a day  When to check my blood sugar before meals; before breakfast; at bedtime     Care Management & Community Referrals:  Referral 06/01/2013  Referrals made for care management support none needed  Referrals made to community resources -

## 2013-06-01 NOTE — Assessment & Plan Note (Signed)
The patient notes right eye redness and itching, without discharge, consistent with allergic conjunctivitis.  Her topical vasoconstrictor may have been making her symptoms worse. -stop visine -start patanol

## 2013-06-01 NOTE — Progress Notes (Signed)
Diabetes Self-Management Training (DSMT)  Follow-Up 3 Visit- 01/26/13 Follow up visit 4 today   06/01/2013 [name], identified by name and date of birth, is a 57 y.o. female with Type 2 Diabetes.  ASSESSMENT Patient concerns are Glycemic control and Weight control.   208#, BMI- 32.2 Lab Results  Component Value Date   LDLCALC 183* 01/26/2013   Lab Results  Component Value Date   HGBA1C 8.4 05/04/2013    Medications See Medications list.  Taking Byetta and not eating in am and taking it several hours after dinner in evening. It is likely she has not been getting the best results because of this.    Exercise Plan Doing walking for 15-20 minutes every other day.   Self-Monitoring  Monitor: accu check aviva Frequency of testing: less than ~ 1x/day Hypoglycemia: No   Meal Planning Some knowledge, Interested in improving and concerned about lack of food due to food stamps cuts- also reports lack of appetite due to taste changes- reports everything tastes bland no matter how she seasons it. This may be the Byetta decreasing her appetite although little weight loss noted.   Assessment comments: weight with little change, patient reports lack of energy and resources as barrier to exercising and eating healthy. Request social work referral for resources and question if depression is playing a role in her lack of energy..    INDIVIDUAL DIABETES EDUCATION PLAN:  Nutrition management Medication Monitoring _______________________________________________________________________  Intervention TOPICS COVERED TODAY:  Nutrition management  how to eat healthy on a budget, encouraging more fruits and vegetables and improved meal pattern- more and smaller meals Monitoring  Purpose and frequency and how to keep a food record to assist her with weight loss.  PATIENTS GOALS/PLAN (copy and paste in patient instructions so patient receives a copy): 1.  Learning Objective:       Show how to  keep food record 2.  Behavioral Objective:             Nutrition: try to eat something for breakfast every day         Medications: To improve blood glucose levels, I will take my medication as prescribed Never 25%  Personalized Follow-Up Plan for Ongoing Self Management Support:  Doctor's Office, family and CDE visits ______________________________________________________________________   Outcomes Expected outcomes: Demonstrated interest in making changes but repeatedly reports no energy as barrier. Self-care Barriers: Lack of material resources, Coping skills Education material provided: yes Patient to contact team via Phone if problems or questions. Time in: 1415     Time out: 1445 Future DSMT - 4-6 wks   Plyler, Lupita Leash

## 2013-06-01 NOTE — Patient Instructions (Addendum)
Take Byetta before eating something in the morning- you can eat something small like yogurt, pudding, fruit  Take Byetta gain before dinner  Try to walk a little bit more- like 4-5 days a week instead of 3-4 days a week.

## 2013-06-01 NOTE — Assessment & Plan Note (Addendum)
Lab Results  Component Value Date   HGBA1C 8.4 05/04/2013   HGBA1C 8.5 03/06/2013   HGBA1C 10.4 11/16/2012     Assessment: Diabetes control: fair control Progress toward A1C goal:  unchanged Comments: The patient self-discontinued novolog, and restarted byetta.  If this is what the patient desires, we can increase byetta to its maximum dose of 10 BID, and stop novolog for now, then reassess at the patient's next visit  Plan: Medications:  Stop novolog.  Increase byetta to 10 BID.  If cbg's are still elevated at next visit, consider restarting novolog Home glucose monitoring: Frequency:   Timing:   Instruction/counseling given: reminded to bring blood glucose meter & log to each visit Educational resources provided:   Self management tools provided:   Other plans: Microalbumin sent today.  Patient meeting with Norm Parcel today.

## 2013-06-01 NOTE — Assessment & Plan Note (Signed)
The patient presents with cerumen impaction of the left ear, with pain and decreased hearing.  The area was softened with hydrogen peroxide.  Disimpaction was attempted using normal saline and a syringe to inject water in the external auditory canal.  A significant amount of wax was removed.  On re-inspection, some wax remained, but tympanic membrane was now visible.  Hearing was retested, and had improved. -successful disimpaction

## 2013-06-01 NOTE — Progress Notes (Signed)
HPI The patient is a 57 y.o. female with a history of DM, HL, tobacco abuse, HTN, presenting for an acute visit for eye and ear pain.  The patient notes right eye and left ear pain x3 weeks.  The patient's left ear started as "shooting pains", which start in the area, and travel down the lateral neck, which comes and goes throughout the day, lasting 5 minutes, occurring several times throughout the day.  The patient notes decreased hearing on that side.  She notes no tinnitus.  She notes no dizziness or imbalance.  No preceding trauma.  No fevers/chills, nausea, vomiting, ear discharge.  No sick contacts.  No nasal congestion, rhinorrhea, sore throat, or cough.  Tylenol didn't change symptoms.  Symptoms are worse at night.  The patient's right eye has been red x3 weeks.  She awoke with these symptoms 3 weeks ago.  Associated with generalized right eye pain and itching, but no eye discharge, or crusting.  She tried visine eye drops, with no success.  She notes mild blurry vision in her right eye.  The patient has a history of DM.  Last A1C was 8.5 on 05/04/13.  At that appointment, she stopped glipizide and byetta, and started Novolog 7 with her largest meal of the day, which is dinner.  The patient brings her glucometer today, which shows a range from 67-383, with an average of 164.  Blood sugars tend to be lowest in the morning, and higher around lunch/dinner.  The patient notes that for the last 2 days, she has stopped novolog, and restarted byetta.  The patient notes only eating 1 meal/day, at dinner, to save money, and because of issues with taste.  ROS: General: no fevers, chills, changes in weight, changes in appetite Skin: no rash HEENT: see HPI Pulm: no dyspnea, coughing, wheezing CV: no chest pain, palpitations, shortness of breath Abd: no abdominal pain, nausea/vomiting, diarrhea/constipation GU: no dysuria, hematuria, polyuria Ext: no arthralgias, myalgias Neuro: no weakness, numbness, or  tingling  Filed Vitals:   06/01/13 1331  BP: 135/92  Pulse: 95  Temp: 97.7 F (36.5 C)    PEX General: alert, cooperative, and in no apparent distress HEENT: Right eye with mild conjunctival injection.  Left eye unremarkable.  Visual acuity in right eye 20/100 without glasses, 20/20 with glasses.  Vision in R eye 20/200 (chronic) Right tympanic membrane unremarkable.  Left tympanic membrane completely occluded by dark wax, with no erythema of the external auditory canal. Decreased hearing in left ear Neck: supple, no lymphadenopathy Lungs: clear to ascultation bilaterally, normal work of respiration, no wheezes, rales, ronchi Heart: regular rate and rhythm, no murmurs, gallops, or rubs Abdomen: soft, non-tender, non-distended, normal bowel sounds Extremities: no cyanosis, clubbing, or edema Neurologic: alert & oriented X3, cranial nerves II-XII intact, strength grossly intact, sensation intact to light touch  Current Outpatient Prescriptions on File Prior to Visit  Medication Sig Dispense Refill  . ACCU-CHEK FASTCLIX LANCETS MISC 1 each by Does not apply route 3 (three) times daily.  102 each  11  . amitriptyline (ELAVIL) 150 MG tablet Take 75 mg by mouth at bedtime.       Marland Kitchen aspirin EC 81 MG tablet Take 81 mg by mouth every morning.      Marland Kitchen atorvastatin (LIPITOR) 40 MG tablet Take 1 tablet (40 mg total) by mouth daily.  30 tablet  0  . insulin aspart (NOVOLOG FLEXPEN) 100 unit/mL SOLN FlexPen Inject 7 units with your largest meal of the  day  15 mL  0  . insulin glargine (LANTUS) 100 UNIT/ML injection Inject 55 Units into the skin at bedtime.  10 mL  12  . Insulin Pen Needle 31G X 6 MM MISC 1 each by Does not apply route 4 (four) times daily.  100 each  11  . Ipratropium-Albuterol (COMBIVENT) 20-100 MCG/ACT AERS respimat Inhale 1 puff into the lungs every 6 (six) hours as needed for wheezing.  4 g  12  . losartan (COZAAR) 50 MG tablet TAKE 1 TABLET BY MOUTH DAILY  30 tablet  0  .  meloxicam (MOBIC) 7.5 MG tablet Take 1 tablet (7.5 mg total) by mouth daily.  30 tablet  2  . metFORMIN (GLUCOPHAGE) 500 MG tablet Take 500 mg by mouth 2 (two) times daily with a meal.      . metoprolol tartrate (LOPRESSOR) 12.5 mg TABS Take 0.5 tablets (12.5 mg total) by mouth 2 (two) times daily.  30 tablet  0  . nitroGLYCERIN (NITRODUR - DOSED IN MG/24 HR) 0.2 mg/hr Place 1 patch onto the skin daily.      . sertraline (ZOLOFT) 100 MG tablet Take 1.5 tablets (150 mg total) by mouth daily.  45 tablet  6  . traMADol (ULTRAM) 50 MG tablet Take 1 tablet (50 mg total) by mouth 2 (two) times daily as needed for pain.  60 tablet  0  . traZODone (DESYREL) 100 MG tablet Take 200 mg by mouth at bedtime.      . Vitamin D, Ergocalciferol, (DRISDOL) 50000 UNITS CAPS Take 1 capsule (50,000 Units total) by mouth every 7 (seven) days.  4 capsule  1   No current facility-administered medications on file prior to visit.    Assessment/Plan

## 2013-06-02 LAB — MICROALBUMIN / CREATININE URINE RATIO
Creatinine, Urine: 197.1 mg/dL
Microalb Creat Ratio: 19.9 mg/g (ref 0.0–30.0)
Microalb, Ur: 3.92 mg/dL — ABNORMAL HIGH (ref 0.00–1.89)

## 2013-06-04 NOTE — Progress Notes (Signed)
Case discussed with Dr. Brown (at time of visit, soon after the resident saw the patient).  We reviewed the resident's history and exam and pertinent patient test results.  I agree with the assessment, diagnosis, and plan of care documented in the resident's note. 

## 2013-06-05 MED ORDER — OLOPATADINE HCL 0.2 % OP SOLN: 1.0000 [drp] | Freq: Every day | OPHTHALMIC | Status: DC

## 2013-06-05 NOTE — Addendum Note (Signed)
Addended by: Linward Headland on: 06/05/2013 03:35 PM   Modules accepted: Orders

## 2013-06-15 ENCOUNTER — Ambulatory Visit (INDEPENDENT_AMBULATORY_CARE_PROVIDER_SITE_OTHER): Payer: Medicare Other | Admitting: Internal Medicine

## 2013-06-15 ENCOUNTER — Other Ambulatory Visit: Payer: Self-pay | Admitting: Internal Medicine

## 2013-06-15 ENCOUNTER — Encounter: Payer: Self-pay | Admitting: Internal Medicine

## 2013-06-15 VITALS — BP 135/93 | HR 100 | Temp 97.1°F | Ht 65.0 in | Wt 194.1 lb

## 2013-06-15 DIAGNOSIS — I10 Essential (primary) hypertension: Secondary | ICD-10-CM

## 2013-06-15 DIAGNOSIS — H6120 Impacted cerumen, unspecified ear: Secondary | ICD-10-CM

## 2013-06-15 DIAGNOSIS — F17200 Nicotine dependence, unspecified, uncomplicated: Secondary | ICD-10-CM

## 2013-06-15 DIAGNOSIS — E559 Vitamin D deficiency, unspecified: Secondary | ICD-10-CM

## 2013-06-15 DIAGNOSIS — E213 Hyperparathyroidism, unspecified: Secondary | ICD-10-CM

## 2013-06-15 DIAGNOSIS — E1149 Type 2 diabetes mellitus with other diabetic neurological complication: Secondary | ICD-10-CM

## 2013-06-15 DIAGNOSIS — R439 Unspecified disturbances of smell and taste: Secondary | ICD-10-CM

## 2013-06-15 DIAGNOSIS — E785 Hyperlipidemia, unspecified: Secondary | ICD-10-CM

## 2013-06-15 DIAGNOSIS — H6123 Impacted cerumen, bilateral: Secondary | ICD-10-CM

## 2013-06-15 DIAGNOSIS — E1142 Type 2 diabetes mellitus with diabetic polyneuropathy: Secondary | ICD-10-CM

## 2013-06-15 LAB — GLUCOSE, CAPILLARY: Glucose-Capillary: 135 mg/dL — ABNORMAL HIGH (ref 70–99)

## 2013-06-15 MED ORDER — METOPROLOL TARTRATE 12.5 MG HALF TABLET: 12.5000 mg | ORAL_TABLET | Freq: Two times a day (BID) | ORAL | Status: DC

## 2013-06-15 MED ORDER — METFORMIN HCL 500 MG PO TABS: 500.0000 mg | ORAL_TABLET | Freq: Two times a day (BID) | ORAL | Status: DC

## 2013-06-15 MED ORDER — ATORVASTATIN CALCIUM 40 MG PO TABS: 40.0000 mg | ORAL_TABLET | Freq: Every day | ORAL | Status: DC

## 2013-06-15 MED ORDER — AMITRIPTYLINE HCL 75 MG PO TABS: 75.0000 mg | ORAL_TABLET | Freq: Every day | ORAL | Status: DC

## 2013-06-15 NOTE — Patient Instructions (Addendum)
General Instructions:  We will send you to a GI doctor to help see if we can figure out why you are sick on your stomach and to get a colonoscopy to check for colon cancer.   We will send you to a hormone doctor to check on whether the calcium levels could be causing your lack of energy and tiredness.  Come back in 2-3 months to check on your sugars and call us sooner with questions or problems at [phone].  Treatment Goals:  Goals (1 Years of Data) as of 06/15/13         As of Today 06/01/13 05/04/13 03/23/13 03/08/13     Blood Pressure    . Blood Pressure < 140/90  135/93 135/92 125/87 135/91 135/86     Diet    . Have 3 meals a day           Result Component    . HEMOGLOBIN A1C < 7.0    8.4      . LDL CALC < 100            Progress Toward Treatment Goals:  Treatment Goal 06/15/2013  Hemoglobin A1C unchanged  Blood pressure unchanged  Stop smoking smoking the same amount    Self Care Goals & Plans:  Self Care Goal 06/15/2013  Manage my medications take my medicines as prescribed  Monitor my health keep track of my blood glucose; bring my glucose meter and log to each visit  Eat healthy foods drink diet soda or water instead of juice or soda  Be physically active find an activity I enjoy  Stop smoking go to the [employer] ([email or web address])  Meeting treatment goals -    Home Blood Glucose Monitoring 06/15/2013  Check my blood sugar 4 times a day  When to check my blood sugar before breakfast; before meals     Care Management & Community Referrals:  Referral 06/01/2013  Referrals made for care management support none needed  Referrals made to community resources -

## 2013-06-15 NOTE — Progress Notes (Signed)
Subjective:     Patient ID: Susan Holmes, female   DOB: [birth year], 57 y.o.   MRN: [id number]  HPI The patient is a 56 YO female who comes into the clinic today for a routine follow up visit. She is having more wax in her ears since getting it cleaned out. She is still having some redness in her right eye although the eye drops have helped some. She is still having not much energy and has noticed that she feels sick to her stomach a lot. She does not throw up and has not noticed blood in her bowel movements. Since January she has lost about 10 pounds without trying. She has no taste and does not enjoy eating. Also her food stamps only allow her to eat one meal per day. She is not having any hypoglycemic episodes or chills. She does not have any sleep problems and still finds enjoyment in everyday activities. She continues to smoke the same amount. She is not having abdominal pain. She is not having any chest pain. She still has decreased exercise tolerance although feels that her inhaler helps her some. She continues to take lantus 55 units at night time and her morning sugars are usually around 100-130.   Review of Systems  Constitutional: Positive for fatigue. Negative for fever, chills, diaphoresis, activity change, appetite change and unexpected weight change.  Respiratory: Positive for shortness of breath. Negative for cough, chest tightness and wheezing.   Cardiovascular: Negative for chest pain, palpitations and leg swelling.  Gastrointestinal: Positive for nausea. Negative for vomiting, abdominal pain and diarrhea.  Musculoskeletal: Positive for back pain. Negative for myalgias.  Skin: Negative.   Neurological: Negative for dizziness, tremors, seizures, syncope, facial asymmetry, speech difficulty, weakness, light-headedness, numbness and headaches.  Psychiatric/Behavioral: Negative.        Objective:   Physical Exam  Constitutional: She is oriented to person, place, and time. She  appears well-developed and well-nourished. No distress.  HENT:  Head: Normocephalic and atraumatic.  Eyes: EOM are normal. Pupils are equal, round, and reactive to light.  Neck: Normal range of motion. Neck supple.  Cardiovascular: Normal rate and regular rhythm.   Pulmonary/Chest: Effort normal and breath sounds normal. No respiratory distress. She has no wheezes. She has no rales. She exhibits no tenderness.  Abdominal: Soft. Bowel sounds are normal. She exhibits no distension and no mass. There is no tenderness. There is no rebound and no guarding.  Musculoskeletal: She exhibits no tenderness.  Neurological: She is alert and oriented to person, place, and time. No cranial nerve deficit.  Skin: Skin is warm and dry. She is not diaphoretic.       Assessment/Plan:   1. Please see problem oriented charting.  2. Disposition - Patient will be referred to endocrinology to see if her fatigue could be a sign of her primary hyperparahyroidism even though her bone density scan was normal. She will see GI for nausea and for screening colonoscopy. Concern for colon cancer in this lady without history of screening with these non-specific signs and Hg last checked was 12.2. She is on byetta and metformin. She was advised to stop high dose vitamin D and to take OTC vitamin D and calcium. She does not wish to quit smoking at this time. She would also like to see ENT for the ear wax and perhaps hearing test.

## 2013-06-17 NOTE — Assessment & Plan Note (Signed)
BP Readings from Last 3 Encounters:  06/15/13 135/93  06/01/13 135/92  05/04/13 125/87    Lab Results  Component Value Date   NA 141 05/04/2013   K 4.2 05/04/2013   CREATININE 0.93 05/04/2013    Assessment: Blood pressure control: mildly elevated Progress toward BP goal:  unchanged Comments:   Plan: Medications:  continue current medications, losartan 50 mg daily, metoprolol 12.5 mg BID Educational resources provided: video Self management tools provided: instructions for home blood pressure monitoring Other plans:

## 2013-06-17 NOTE — Assessment & Plan Note (Signed)
Repeat level was improving and to minimize risk of toxicity since she also has primary hyperparathyroidism will stop high dose vitamin D and switch to OTC supplementation.

## 2013-06-17 NOTE — Assessment & Plan Note (Signed)
Will refer to endocrinology. Unclear if these vague constitutional symptoms are related to this problem. If they are then the question of whether surgical resection may be beneficial to this patient.

## 2013-06-17 NOTE — Assessment & Plan Note (Signed)
  Assessment: Progress toward smoking cessation:  smoking the same amount Barriers to progress toward smoking cessation:  lack of motivation to quit Comments:   Plan: Instruction/counseling given:  I counseled patient on the dangers of tobacco use, advised patient to stop smoking, and reviewed strategies to maximize success. Educational resources provided:  [employer]) brochure Self management tools provided:  smoking cessation plan ([employer] Quit Plan) Medications to assist with smoking cessation:  None Patient agreed to the following self-care plans for smoking cessation: go to the [employer] ([email or web address])  Other plans:

## 2013-06-17 NOTE — Assessment & Plan Note (Signed)
Lab Results  Component Value Date   HGBA1C 8.4 05/04/2013   HGBA1C 8.5 03/06/2013   HGBA1C 10.4 11/16/2012     Assessment: Diabetes control: fair control Progress toward A1C goal:  unchanged Comments:   Plan: Medications:  continue current medications, byetta [place] BID with meals, lantus 55 units at bedtime, metformin 500 mg BID Home glucose monitoring: Frequency: 4 times a day Timing: before breakfast;before meals Instruction/counseling given: reminded to get eye exam, reminded to bring blood glucose meter & log to each visit and discussed diet Educational resources provided: brochure Self management tools provided: copy of home glucose meter download Other plans: Possible that bad taste could be coming from metformin although she has been on this medication for a long time and the bad taste is more recent.

## 2013-06-17 NOTE — Assessment & Plan Note (Signed)
Patient still has wax in her ears and the left more than the right. She requests to see an ENT doctor and will facilitate this with referral. She may also benefit from hearing test after de-impaction.

## 2013-06-18 NOTE — Progress Notes (Signed)
Case discussed with Dr. Kollar soon after the resident saw the patient.  We reviewed the resident's history and exam and pertinent patient test results.  I agree with the assessment, diagnosis, and plan of care documented in the resident's note. 

## 2013-06-26 ENCOUNTER — Other Ambulatory Visit: Payer: Self-pay | Admitting: Internal Medicine

## 2013-07-12 ENCOUNTER — Encounter (HOSPITAL_COMMUNITY): Payer: Self-pay

## 2013-07-12 ENCOUNTER — Emergency Department (HOSPITAL_COMMUNITY)
Admission: EM | Admit: 2013-07-12 | Discharge: 2013-07-12 | Disposition: A | Discharge status: Home / Self Care | Payer: Medicare Other | Attending: Emergency Medicine | Admitting: Emergency Medicine

## 2013-07-12 ENCOUNTER — Telehealth: Payer: Self-pay | Admitting: *Deleted

## 2013-07-12 DIAGNOSIS — E785 Hyperlipidemia, unspecified: Secondary | ICD-10-CM | POA: Insufficient documentation

## 2013-07-12 DIAGNOSIS — Z8619 Personal history of other infectious and parasitic diseases: Secondary | ICD-10-CM | POA: Insufficient documentation

## 2013-07-12 DIAGNOSIS — M7989 Other specified soft tissue disorders: Secondary | ICD-10-CM | POA: Insufficient documentation

## 2013-07-12 DIAGNOSIS — M79609 Pain in unspecified limb: Secondary | ICD-10-CM

## 2013-07-12 DIAGNOSIS — Z7982 Long term (current) use of aspirin: Secondary | ICD-10-CM | POA: Insufficient documentation

## 2013-07-12 DIAGNOSIS — F411 Generalized anxiety disorder: Secondary | ICD-10-CM | POA: Insufficient documentation

## 2013-07-12 DIAGNOSIS — Z794 Long term (current) use of insulin: Secondary | ICD-10-CM | POA: Insufficient documentation

## 2013-07-12 DIAGNOSIS — H543 Unqualified visual loss, both eyes: Secondary | ICD-10-CM | POA: Insufficient documentation

## 2013-07-12 DIAGNOSIS — I10 Essential (primary) hypertension: Secondary | ICD-10-CM | POA: Insufficient documentation

## 2013-07-12 DIAGNOSIS — F329 Major depressive disorder, single episode, unspecified: Secondary | ICD-10-CM | POA: Insufficient documentation

## 2013-07-12 DIAGNOSIS — M545 Low back pain, unspecified: Secondary | ICD-10-CM

## 2013-07-12 DIAGNOSIS — F17200 Nicotine dependence, unspecified, uncomplicated: Secondary | ICD-10-CM

## 2013-07-12 DIAGNOSIS — F3289 Other specified depressive episodes: Secondary | ICD-10-CM | POA: Insufficient documentation

## 2013-07-12 DIAGNOSIS — E119 Type 2 diabetes mellitus without complications: Secondary | ICD-10-CM | POA: Insufficient documentation

## 2013-07-12 DIAGNOSIS — M129 Arthropathy, unspecified: Secondary | ICD-10-CM | POA: Insufficient documentation

## 2013-07-12 DIAGNOSIS — M79605 Pain in left leg: Secondary | ICD-10-CM

## 2013-07-12 DIAGNOSIS — Z79899 Other long term (current) drug therapy: Secondary | ICD-10-CM | POA: Insufficient documentation

## 2013-07-12 MED ORDER — TRAMADOL HCL 50 MG PO TABS: 50.0000 mg | ORAL_TABLET | Freq: Four times a day (QID) | ORAL | Status: DC | PRN

## 2013-07-12 NOTE — Telephone Encounter (Signed)
I called and spoke with patient, who was seen in the emergency department today.  I advised her that if she has any new or worsening symptoms she should return to the emergency department; otherwise she will follow up in clinic tomorrow for her scheduled appointment.

## 2013-07-12 NOTE — Progress Notes (Signed)
VASCULAR LAB PRELIMINARY  PRELIMINARY  PRELIMINARY  PRELIMINARY  Left lower extremity venous duplex completed.    Preliminary report:  Left:  No evidence of DVT, superficial thrombosis, or Baker's cyst.  Mackie Goon, RVS 07/12/2013, 3:16 PM

## 2013-07-12 NOTE — Telephone Encounter (Signed)
Pt called stating she was seen in ED today @ 1:15 for weakness, decrease appetite, dizziness, pain in left leg and leaning to rt side when she walks.  She was sent home and to f/u with Clinic. Tramadol was given for left leg pain. The weakness and decrease appetite has been ongoing for over a month. Over the past week she gets tired and dizzy just walking to the mailbox. Today she says she leans to the right side when she walks.  Pt # [id number]  Pt given appointment in clinic tomorrow at 1:15

## 2013-07-12 NOTE — ED Notes (Signed)
Patient reports that she has pain and weakness to left leg x 1 week. Patient denies any injury to the left leg.

## 2013-07-13 ENCOUNTER — Ambulatory Visit (INDEPENDENT_AMBULATORY_CARE_PROVIDER_SITE_OTHER): Payer: Medicare Other | Admitting: Internal Medicine

## 2013-07-13 ENCOUNTER — Encounter: Payer: Self-pay | Admitting: Internal Medicine

## 2013-07-13 VITALS — BP 100/60 | HR 102 | Temp 97.6°F | Ht 65.0 in | Wt 188.3 lb

## 2013-07-13 DIAGNOSIS — I5042 Chronic combined systolic (congestive) and diastolic (congestive) heart failure: Secondary | ICD-10-CM

## 2013-07-13 DIAGNOSIS — E1142 Type 2 diabetes mellitus with diabetic polyneuropathy: Secondary | ICD-10-CM

## 2013-07-13 DIAGNOSIS — E210 Primary hyperparathyroidism: Secondary | ICD-10-CM

## 2013-07-13 DIAGNOSIS — F17200 Nicotine dependence, unspecified, uncomplicated: Secondary | ICD-10-CM

## 2013-07-13 DIAGNOSIS — I10 Essential (primary) hypertension: Secondary | ICD-10-CM

## 2013-07-13 DIAGNOSIS — E1149 Type 2 diabetes mellitus with other diabetic neurological complication: Secondary | ICD-10-CM

## 2013-07-13 DIAGNOSIS — R0602 Shortness of breath: Secondary | ICD-10-CM | POA: Insufficient documentation

## 2013-07-13 DIAGNOSIS — E1165 Type 2 diabetes mellitus with hyperglycemia: Secondary | ICD-10-CM

## 2013-07-13 DIAGNOSIS — E213 Hyperparathyroidism, unspecified: Secondary | ICD-10-CM

## 2013-07-13 DIAGNOSIS — R42 Dizziness and giddiness: Secondary | ICD-10-CM

## 2013-07-13 DIAGNOSIS — I509 Heart failure, unspecified: Secondary | ICD-10-CM

## 2013-07-13 LAB — GLUCOSE, CAPILLARY
Glucose-Capillary: 69 mg/dL — ABNORMAL LOW (ref 70–99)
Glucose-Capillary: 80 mg/dL (ref 70–99)

## 2013-07-13 MED ORDER — MECLIZINE HCL 12.5 MG PO TABS: 12.5000 mg | ORAL_TABLET | Freq: Three times a day (TID) | ORAL | Status: DC | PRN

## 2013-07-13 NOTE — Assessment & Plan Note (Addendum)
Complains of worsening DOE x1 month.  Noted to have EF 50-55% on last echo.  Possibly also secondary to continued smoking.  No notable lower extremity edema or crackles on physical exam. Reports compliance with medications although does not appear to be on BB at this time despite being started on one on hospital discharge.    -f/u with pcp advised and clarification of medications ?stress test with continued SOB

## 2013-07-13 NOTE — Assessment & Plan Note (Signed)
Lab Results  Component Value Date   HGBA1C 8.4 05/04/2013   HGBA1C 8.5 03/06/2013   HGBA1C 10.4 11/16/2012    Assessment: Diabetes control: fair control Progress toward A1C goal:  unable to assess.  cbg 69 today improved to 80s after orange juice. Has not been eating much and no insulin taken today.   Plan: Medications:  continue current medications Home glucose monitoring: Frequency: 3 times a day Timing: before meals Instruction/counseling given: reminded to get eye exam, reminded to bring blood glucose meter & log to each visit, reminded to bring medications to each visit, discussed foot care and discussed the need for weight loss

## 2013-07-13 NOTE — Assessment & Plan Note (Signed)
Noted upon standing quickly then spontaneously resolved.  Denies tinnitus, headaches, or ear pain.  Not orthostatic based on vital signs in opc today.  BP 110/70 with HR 98 lying and standing 100/60 with HR 102.  Unable to do heel to shin test due to pain and balance but stable when standing with eyes closed.  Possible secondary to decreased po intake lately with nausea and part of complaints of possible hyperPTH.   -instructed to stand slowly and take time before walking -use walking support as needed, consider cane -meclizine 12.5mg  prn

## 2013-07-13 NOTE — Progress Notes (Signed)
Subjective:   Patient ID: Susan Holmes female   DOB: June 27, 1956 57 y.o.   MRN: [id number]  HPI: [name] is a 57 y.o. female with PMH of DM2, HL, tobacco abuse, HTN, and hyper parathyroidism presents to clinic today for acute visit with complaints of SOB and weakness x1 month.  She recently went to the ED yesterday for similar complaints and says they did not address these issues.  She did have LLE venous duplex done that was negative for DVT per preliminary read.  She comes today endorsing worsening hip pain, especially while walking causing her to lean to her left, lower back pain, and feeling weak.  She recalls falling twice this week but did not hit her head.  She endorses dizziness upon standing that then resolves and worsening shortness of breath even short distances.  She says if she walks from the clinic room to the front desk she can be panting at times.  She denies vomiting, headaches, chest pain, changes in her vision, but does report nausea, feeling fidgety, and hot.  She says it takes her approximately an hour to fall asleep but that she does not know if she snores.  She continue to smoke approximately 1 pack every 3 days and has no motivation to quit.  I counseled her on dangers of smoking again today and encouraged cessation.  She has an appointment to see Dr. Dulce Holmes next week and is pending approval for appointment with endocrinology Dr. Sharl Holmes.  She denies any urinary complaints including dysuria or abdominal pain.  Of note, her PTH in 02/2013 was 109.9, calcium of 10-12 and albumin of 3.5-4.6 with TSH of 3 and ft4 1.21.    Of note, cbg initially 86 today as she has not eaten anything and has not given herself insulin either.   Past Medical History  Diagnosis Date  . Hepatitis B infection pt unsure    resolved  . Diabetes mellitus, type II   . HTN (hypertension)   . Hyperlipidemia   . Congenital blindness     L eye  . Tobacco abuse     .5 PPD smoker  . Anxiety   .  Depression   . Shortness of breath   . Arthritis    Current Outpatient Prescriptions  Medication Sig Dispense Refill  . ACCU-CHEK FASTCLIX LANCETS MISC 1 each by Does not apply route 3 (three) times daily.  102 each  11  . amitriptyline (ELAVIL) 75 MG tablet TAKE 1 TABLET BY MOUTH EVERY NIGHT AT BEDTIME  90 tablet  1  . aspirin EC 81 MG tablet Take 81 mg by mouth every morning.      Marland Kitchen atorvastatin (LIPITOR) 40 MG tablet Take 1 tablet (40 mg total) by mouth daily.  30 tablet  6  . exenatide (BYETTA 5 MCG PEN) 5 MCG/0.02ML SOPN Inject 0.04 mLs (10 mcg total) into the skin 2 (two) times daily with a meal.  1.2 mL  3  . insulin glargine (LANTUS) 100 UNIT/ML injection Inject 55 Units into the skin at bedtime.  10 mL  12  . Insulin Pen Needle 31G X 6 MM MISC 1 each by Does not apply route 4 (four) times daily.  100 each  11  . Ipratropium-Albuterol (COMBIVENT) 20-100 MCG/ACT AERS respimat Inhale 1 puff into the lungs every 6 (six) hours as needed for wheezing.  4 g  12  . losartan (COZAAR) 50 MG tablet TAKE 1 TABLET BY MOUTH EVERY DAY  30  tablet  0  . metFORMIN (GLUCOPHAGE) 500 MG tablet Take 1 tablet (500 mg total) by mouth 2 (two) times daily with a meal.  60 tablet  3  . nitroGLYCERIN (NITRODUR - DOSED IN MG/24 HR) 0.2 mg/hr Place 1 patch onto the skin daily.      . Olopatadine HCl (PATADAY) 0.2 % SOLN Apply 1 drop to eye daily. (Right eye only)  1 Bottle  1  . sertraline (ZOLOFT) 100 MG tablet Take 1.5 tablets (150 mg total) by mouth daily.  45 tablet  6  . traMADol (ULTRAM) 50 MG tablet Take 1 tablet (50 mg total) by mouth every 6 (six) hours as needed for pain.  15 tablet  0  . traZODone (DESYREL) 100 MG tablet Take 200 mg by mouth at bedtime.       No current facility-administered medications for this visit.   Family History  Problem Relation Age of Onset  . Heart disease Mother 79  . Hypertension Mother   . Heart disease Father 57  . Aneurysm Brother   . Aneurysm Sister   . Colon  cancer Neg Hx   . Esophageal cancer Neg Hx   . Stomach cancer Neg Hx    History   Social History  . Marital Status: Divorced    Spouse Name: N/A    Number of Children: N/A  . Years of Education: N/A   Social History Main Topics  . Smoking status: Current Every Day Smoker -- 0.50 packs/day for 20 years    Types: Cigarettes  . Smokeless tobacco: Current User    Types: Chew     Comment: did not want to discuss any stopping or decreasing  . Alcohol Use: No  . Drug Use: No  . Sexually Active: Not Currently    Birth Control/ Protection: Surgical   Other Topics Concern  . None   Social History Narrative  . None   Review of Systems:  Constitutional:  "hot spells", decreased appetite.  Denies fever, chills, and fatigue.   HEENT:  Decreased or strange taste in mouth. Denies congestion, sore throat, rhinorrhea, sneezing, mouth sores, trouble swallowing, neck pain   Respiratory:  SOB and DOE.  Denies cough, and wheezing.   Cardiovascular:  Denies chest pain, palpitations, and leg swelling.   Gastrointestinal:  Nausea and constipation.  Denies vomiting, abdominal pain, diarrhea, blood in stool and abdominal distention.   Genitourinary:  Denies dysuria, urgency, frequency, hematuria, flank pain and difficulty urinating.   Musculoskeletal:  Lower back pain, hip pain, and gait problem.     Skin:  Denies pallor, rash and wound.   Neurological:  Dizziness upon standing and weakness.  Denies seizures, syncope, light-headedness, numbness and headaches.    Objective:  Physical Exam: Filed Vitals:   07/13/13 1343 07/13/13 1345 07/13/13 1346 07/13/13 1347  BP: 117/74 110/70 110/80 100/60  Pulse: 96 98 93 102  Temp: 97.6 F (36.4 C)     TempSrc: Oral     Height: 5\' 5"  (1.651 m)     Weight: 188 lb 4.8 oz (85.412 kg)     SpO2: 100%      Vitals reviewed. General: sitting in chair, NAD, chewing on gum HEENT: PERRL, EOMI, no scleral icterus Cardiac: RRR, no rubs, murmurs or gallops Pulm:  clear to auscultation bilaterally, no wheezes, rales, or rhonchi Abd: soft, mild tenderness to deep palpation diffusely, nondistended, BS present Ext: warm and well perfused, no pedal edema, +2DP B/L, no tenderness to palpation of b/l  extremities or hips.  No visible erythema. Limited flexion and extension of hips due to pain, + pain in lower back with raising legs but no shooting pains or numbness described in lower extremities.  Neuro: alert and oriented X3, cranial nerves II-XII grossly intact, strength 4/5 LUE compared to 5/5 in all other extremities, and sensation to light touch equal in bilateral upper and lower extremities.  Staggered gait, stable and steady with eyes close with no drift noted.  No dysdiadochokinesia and slow finger to nose.  Unable to lift legs without pain for hell to shin.    Assessment & Plan:  Discussed with Dr. Rogelia Boga HyperPTH: proceed with PTH imaging, endocrinology referral SOB: PFTs

## 2013-07-13 NOTE — Assessment & Plan Note (Addendum)
Worsening.  Possibly secondary to CHF hx vs. Hypercalcemia and even continued smoking.  Does not appear fluid overloaded on physical exam and weight down to 188.    -further work up of hyperPTH -PT imaging -counseled on if breathing gets worse or any chest pain, headaches, syncope, fatigue, to go to ED immediately -PFTs

## 2013-07-13 NOTE — Assessment & Plan Note (Addendum)
Appears to have some of the "bones, stones, abdominal moans and psychic groans"--mainly joint pain, abdominal moans and psychic groans but no evidence of stones at this time.  With her vague symptoms and worsening of weakness and staggered again and pain, might be reasonable time for imaging.    -will need to measure urinary calcium excretion, appears to not be done yet. -PTH imaging  -meclizine prn nausea and dizziness

## 2013-07-13 NOTE — Patient Instructions (Addendum)
General Instructions: Please get your parathyroid scan done and your pulmonary function tests  Please stop smoking, that is NOT helping with your shortness of breath  Please stand up slowly and take your time before moving, try taking meclizine as prescribed for your dizziness and may help with nausea as well  Try to eat regular meals with your insulin and check your blood sugars and bring meter to clinic next time  If your dizziness or pain gets worse, call the clinic and speak to pcp right away [phone]  Follow up with Dr. Dulce Sellar from GI tomorrow and endocrinology when scheduled  Treatment Goals:  Goals (1 Years of Data) as of 07/13/13         As of Today As of Today As of Today As of Today 07/12/13     Blood Pressure    . Blood Pressure < 140/90  100/60 110/80 110/70 117/74 134/76     Diet    . Have 3 meals a day           Result Component    . HEMOGLOBIN A1C < 7.0          . LDL CALC < 100            Progress Toward Treatment Goals:  Treatment Goal 07/13/2013  Hemoglobin A1C unable to assess  Blood pressure at goal  Stop smoking smoking the same amount    Self Care Goals & Plans:  Self Care Goal 07/13/2013  Manage my medications take my medicines as prescribed  Monitor my health -  Eat healthy foods -  Be physically active -  Stop smoking call [facility] (1-800-QUIT-NOW)  Meeting treatment goals -    Home Blood Glucose Monitoring 07/13/2013  Check my blood sugar 3 times a day  When to check my blood sugar before meals   Care Management & Community Referrals:  Referral 06/01/2013  Referrals made for care management support none needed  Referrals made to community resources -    Meclizine tablets or capsules What is this medicine? MECLIZINE (MEK li zeen) is an antihistamine. It is used to prevent nausea, vomiting, or dizziness caused by motion sickness. It is also used to prevent and treat vertigo (extreme dizziness or a feeling that you or your surroundings  are tilting or spinning around). This medicine may be used for other purposes; ask your health care provider or pharmacist if you have questions. What should I tell my health care provider before I take this medicine? They need to know if you have any of these conditions: -asthma -glaucoma -prostate trouble -stomach problems -urinary problems -an unusual or allergic reaction to meclizine, other medicines, foods, dyes, or preservatives -pregnant or trying to get pregnant -breast-feeding How should I use this medicine? Take this medicine by mouth with a glass of water. Follow the directions on the prescription label. If you are using this medicine to prevent motion sickness, take the dose at least 1 hour before travel. If it upsets your stomach, take it with food or milk. Take your doses at regular intervals. Do not take your medicine more often than directed. Talk to your pediatrician regarding the use of this medicine in children. Special care may be needed. Overdosage: If you think you have taken too much of this medicine contact a poison control center or emergency room at once. NOTE: This medicine is only for you. Do not share this medicine with others. What if I miss a dose? If you miss a  dose, take it as soon as you can. If it is almost time for your next dose, take only that dose. Do not take double or extra doses. What may interact with this medicine? -barbiturate medicines for inducing sleep or treating seizures -digoxin -medicines for anxiety or sleeping problems, like alprazolam, diazepam or temazepam -medicines for hay fever and other allergies -medicines for mental depression -medicines for movement abnormalities as in Parkinson's disease, or for stomach problems -medicines for pain -medicines that relax muscles This list may not describe all possible interactions. Give your health care provider a list of all the medicines, herbs, non-prescription drugs, or dietary supplements  you use. Also tell them if you smoke, drink alcohol, or use illegal drugs. Some items may interact with your medicine. What should I watch for while using this medicine? If you are taking this medicine on a regular schedule, visit your doctor or health care professional for regular checks on your progress. You may get dizzy, drowsy or have blurred vision. Do not drive, use machinery, or do anything that needs mental alertness until you know how this medicine affects you. Do not stand or sit up quickly, especially if you are an older patient. This reduces the risk of dizzy or fainting spells. Alcohol can increase possible dizziness. Avoid alcoholic drinks. Your mouth may get dry. Chewing sugarless gum or sucking hard candy, and drinking plenty of water may help. Contact your doctor if the problem does not go away or is severe. This medicine may cause dry eyes and blurred vision. If you wear contact lenses you may feel some discomfort. Lubricating drops may help. See your eye doctor if the problem does not go away or is severe. What side effects may I notice from receiving this medicine? Side effects that you should report to your doctor or health care professional as soon as possible: -fainting spells -fast or irregular heartbeat Side effects that usually do not require medical attention (report to your doctor or health care professional if they continue or are bothersome): -constipation -difficulty passing urine -difficulty sleeping -headache -stomach upset This list may not describe all possible side effects. Call your doctor for medical advice about side effects. You may report side effects to FDA at 1-800-FDA-1088. Where should I keep my medicine? Keep out of the reach of children. Store at room temperature between 15 and 30 degrees C (59 and 86 degrees F). Keep container tightly closed. Throw away any unused medicine after the expiration date. NOTE: This sheet is a summary. It may not cover  all possible information. If you have questions about this medicine, talk to your doctor, pharmacist, or health care provider.  2012, [employer]vier/Gold Standard. (06/20/2008 10:35:36 AM)  Pulmonary Function Tests Your caregiver has scheduled you for pulmonary function testing. Pulmonary Function Tests (PFTs) are tests which help [place] know how your lungs are working. The lungs are the large organs in your chest on both sides of the heart which allow you to breathe. The main job of the lungs is ventilation. Ventilation is moving air in and out of the lungs. The air breathed in contains oxygen which allows you to live. When you breathe out, your lungs get rid of carbon dioxide, a waste product of breathing. The medical term for breathing in is inhalation. Breathing out is called exhalation. Some medical conditions interfere with breathing. This may be sudden and short lived as with pneumonia, or may be long standing such as with COPD (chronic obstructive pulmonary disease) that which may  come as a result of years of smoking. CONDITIONS THAT INTERFERE WITH NORMAL BREATHING ARE CALLED RESTRICTIVE OR OBSTRUCTIVE  An obstructive condition occurs when air has difficulty flowing into the lungs due to resistance. This causes a decreased flow of air in the lungs. A restrictive condition occurs when the chest muscles are unable to expand adequately. This also causes a decreased flow of air in the lungs. USES OF PULMONARY FUNCTION TESTS Lung (pulmonary) function studies are used to find out causes of lung problems and what will be the best treatment. The "PFT" terms listed below refer to different procedures that measure lung function in different ways. Pulmonary function testing is quick, simple and safe for most people. There are many different reasons why PFTs may be ordered.   For healthy individuals as part of a routine physical examination  In industrial plants to follow how your lungs are working when exposed to  chemicals over a long period of time  When an illness involving the lungs is suspected.  PFTs may be used to assess the lung function of patients prior to surgery or other procedures in patients who have current lung and/or heart problems.  The test is also used for people who are smokers or who have other conditions that might be affected by surgery or other procedures. Some common measurements or values (terms) you may hear your caregiver use are:  Tidal volume (TV) - amount of air breathed in or out during normal breathing.  Minute volume (MV) - total amount of air breathed in and out per minute.  Vital capacity (VC) - total volume of air that can be breathed out after the largest breath in you can take.  Functional residual capacity (FRC) - amount of air remaining in lungs after normal breathing out.  Total lung capacity - total amount of air in the lungs with the largest breath you can take.  Forced vital capacity (FVC) - the amount of air forced out quickly after taking the largest possible breath.  Forced expiratory volume (FEV) - volume of air breathed out during the first, second, and third seconds of the FVC test.  Forced expiratory flow (FEF) - average rate of flow during the middle half of the FVC test.  Peak expiratory flow rate (PEFR) - maximum amount of air during forced breathing out. The values of these tests vary from person to person. Your values are compared to other people who have had the test and are similar to you in age, size, etc. They can also be compared to previous tests following treatment of lung disease. The tests can determine if lung function is getting better and if the treatments used are successful. COMPLICATIONS OF TESTING MAY INCLUDE:  Light-headedness due to over breathing (hyperventilation).  An asthmatic attack from deep breathing. SOME REASONS FOR NOT DOING THE TEST INCLUDE:  Recent eye surgery, because of increased pressure inside the eyes  during the procedure.  Recent abdominal or chest surgery, because of inability to take deep breaths.  Chest pain or heart problems.  Tuberculosis or respiratory infections, such as pneumonia, a cold, or the flu. Discuss concerns with your caregiver before your procedure. PREPARATION FOR TEST   Avoid eating a large meal before your test.  Do not smoke before your test.  Take medications as ordered by your caregiver.  Wear comfortable clothing which will not interfere with breathing. If done as an outpatient, you should be present 60 minutes prior to your procedure or as directed.  DURING THE TEST  You will be given a soft nose clip to wear during the procedure so that all of your breaths will go through your mouth instead of your nose.  You will be given a sterile (germ-free) mouthpiece that will be attached to the spirometer. The spirometer is the machine that measures your breathing.  You will be instructed to perform various breathing maneuvers. The maneuvers will be done by inhaling (breathing in) and exhaling (breathing out). Depending on what measurements are ordered, you may be asked to repeat the maneuvers several times before the test is completed.  You may be given a bronchodilator after testing has been performed. A bronchodilator is a medication which makes the small air passages in your lungs larger. These medications usually make it easier to breathe. The tests are then repeated several minutes later after the bronchodilator has taken effect.  You will be monitored carefully during the procedure for faintness, dizziness, difficulty breathing, or any other problems. AFTER THE PROCEDURE   You may resume your usual diet, medications, and activities unless your physician advises you otherwise.  Your caregiver will go over your test results with you and determine what is causing your lung problems and what treatments may be helpful. Document Released: 08/05/2004 Document  Revised: 03/06/2012 Document Reviewed: 12/11/2008 [facility] Patient Information 2014 [place], [place].

## 2013-07-13 NOTE — Assessment & Plan Note (Signed)
BP Readings from Last 3 Encounters:  07/13/13 100/60  07/12/13 134/76  06/15/13 135/93   Lab Results  Component Value Date   NA 141 05/04/2013   K 4.2 05/04/2013   CREATININE 0.93 05/04/2013   Assessment: Blood pressure control: controlled Progress toward BP goal:  at goal  Plan: Medications:  continue current medications losartan 50 **

## 2013-07-13 NOTE — Assessment & Plan Note (Signed)
  Assessment: Progress toward smoking cessation:  smoking the same amount Barriers to progress toward smoking cessation:  lack of motivation to quit Comments: no intention to quit at this time despite counseling and cessation advised  Plan: Instruction/counseling given:  I counseled patient on the dangers of tobacco use, advised patient to stop smoking, and reviewed strategies to maximize success. Educational resources provided:    Self management tools provided:    Medications to assist with smoking cessation:  None Patient agreed to the following self-care plans for smoking cessation: call [facility] (1-800-QUIT-NOW)

## 2013-07-16 ENCOUNTER — Emergency Department (HOSPITAL_COMMUNITY): Payer: Medicare Other

## 2013-07-16 ENCOUNTER — Encounter (HOSPITAL_COMMUNITY): Payer: Self-pay | Admitting: *Deleted

## 2013-07-16 ENCOUNTER — Emergency Department (HOSPITAL_COMMUNITY)
Admission: EM | Admit: 2013-07-16 | Discharge: 2013-07-16 | Disposition: A | Discharge status: Home / Self Care | Payer: Medicare Other | Attending: Emergency Medicine | Admitting: Emergency Medicine

## 2013-07-16 DIAGNOSIS — Z7982 Long term (current) use of aspirin: Secondary | ICD-10-CM | POA: Insufficient documentation

## 2013-07-16 DIAGNOSIS — E785 Hyperlipidemia, unspecified: Secondary | ICD-10-CM | POA: Insufficient documentation

## 2013-07-16 DIAGNOSIS — Z794 Long term (current) use of insulin: Secondary | ICD-10-CM | POA: Insufficient documentation

## 2013-07-16 DIAGNOSIS — M1990 Unspecified osteoarthritis, unspecified site: Secondary | ICD-10-CM | POA: Insufficient documentation

## 2013-07-16 DIAGNOSIS — S2231XA Fracture of one rib, right side, initial encounter for closed fracture: Secondary | ICD-10-CM

## 2013-07-16 DIAGNOSIS — Z8619 Personal history of other infectious and parasitic diseases: Secondary | ICD-10-CM | POA: Insufficient documentation

## 2013-07-16 DIAGNOSIS — Z79899 Other long term (current) drug therapy: Secondary | ICD-10-CM | POA: Insufficient documentation

## 2013-07-16 DIAGNOSIS — H5440 Blindness, one eye, unspecified eye: Secondary | ICD-10-CM | POA: Insufficient documentation

## 2013-07-16 DIAGNOSIS — S2239XA Fracture of one rib, unspecified side, initial encounter for closed fracture: Secondary | ICD-10-CM | POA: Insufficient documentation

## 2013-07-16 DIAGNOSIS — Y9301 Activity, walking, marching and hiking: Secondary | ICD-10-CM | POA: Insufficient documentation

## 2013-07-16 DIAGNOSIS — IMO0002 Reserved for concepts with insufficient information to code with codable children: Secondary | ICD-10-CM | POA: Insufficient documentation

## 2013-07-16 DIAGNOSIS — F329 Major depressive disorder, single episode, unspecified: Secondary | ICD-10-CM | POA: Insufficient documentation

## 2013-07-16 DIAGNOSIS — I10 Essential (primary) hypertension: Secondary | ICD-10-CM | POA: Insufficient documentation

## 2013-07-16 DIAGNOSIS — F411 Generalized anxiety disorder: Secondary | ICD-10-CM | POA: Insufficient documentation

## 2013-07-16 DIAGNOSIS — Y929 Unspecified place or not applicable: Secondary | ICD-10-CM | POA: Insufficient documentation

## 2013-07-16 DIAGNOSIS — F3289 Other specified depressive episodes: Secondary | ICD-10-CM | POA: Insufficient documentation

## 2013-07-16 DIAGNOSIS — F17200 Nicotine dependence, unspecified, uncomplicated: Secondary | ICD-10-CM | POA: Insufficient documentation

## 2013-07-16 DIAGNOSIS — E119 Type 2 diabetes mellitus without complications: Secondary | ICD-10-CM | POA: Insufficient documentation

## 2013-07-16 DIAGNOSIS — W108XXA Fall (on) (from) other stairs and steps, initial encounter: Secondary | ICD-10-CM | POA: Insufficient documentation

## 2013-07-16 LAB — URINE MICROSCOPIC-ADD ON

## 2013-07-16 LAB — POCT I-STAT, CHEM 8
BUN: 12 mg/dL (ref 6–23)
Calcium, Ion: 1.24 mmol/L — ABNORMAL HIGH (ref 1.12–1.23)
Chloride: 104 mEq/L (ref 96–112)
Creatinine, Ser: 0.8 mg/dL (ref 0.50–1.10)
Glucose, Bld: 67 mg/dL — ABNORMAL LOW (ref 70–99)
HCT: 38 % (ref 36.0–46.0)
Hemoglobin: 12.9 g/dL (ref 12.0–15.0)
Potassium: 4.3 mEq/L (ref 3.5–5.1)
Sodium: 140 mEq/L (ref 135–145)
TCO2: 25 mmol/L (ref 0–100)

## 2013-07-16 LAB — URINALYSIS, ROUTINE W REFLEX MICROSCOPIC
Bilirubin Urine: NEGATIVE
Glucose, UA: NEGATIVE mg/dL
Hgb urine dipstick: NEGATIVE
Ketones, ur: NEGATIVE mg/dL
Nitrite: NEGATIVE
Protein, ur: NEGATIVE mg/dL
Specific Gravity, Urine: 1.021 (ref 1.005–1.030)
Urobilinogen, UA: 0.2 mg/dL (ref 0.0–1.0)
pH: 7 (ref 5.0–8.0)

## 2013-07-16 MED ORDER — TRAMADOL HCL 50 MG PO TABS: 50.0000 mg | ORAL_TABLET | Freq: Four times a day (QID) | ORAL | Status: DC | PRN

## 2013-07-16 MED ORDER — HYDROCODONE-ACETAMINOPHEN 5-325 MG PO TABS
1.0000 | ORAL_TABLET | Freq: Once | ORAL | Status: AC
  Administered 2013-07-16: 1 via ORAL
  Filled 2013-07-16: qty 1

## 2013-07-16 NOTE — ED Provider Notes (Signed)
History    CSN: [id number] Arrival date & time 07/16/13  1453  First MD Initiated Contact with Patient 07/16/13 1524     Chief Complaint  Patient presents with  . Fall  . Leg Injury   (Consider location/radiation/quality/duration/timing/severity/associated sxs/prior Treatment) HPI  57 year old female with history of insulin-dependent diabetes, hepatitis B, chronic back pain presents for evaluation of fall.  Worse for the past month she has had lack of appetite, eating very little, and is feeling weak. Describe generalized weakness, progressively worse today while walking down the steps she states her knee gave out causing her to fall down several steps. Denies hitting her head or loss of consciousness but however did hit her right side of chest against a chair. No other precipitating symptoms prior to her knee "gave out".  She currently complaining of pain to her right ribs, and pain to her low back.  Pain is sharp, stabbing, worsening with palpation or with taking deep breath. Patient however denies fever, chills, headache, neck pain, cough, hemoptysis, abdominal pain, hip pain, knee pain, or ankle pain. No specific treatment tried. Patient mentioned that her diabetes is controlled. There has been no recent change in medication.  Past Medical History  Diagnosis Date  . Hepatitis B infection pt unsure    resolved  . Diabetes mellitus, type II   . HTN (hypertension)   . Hyperlipidemia   . Congenital blindness     L eye  . Tobacco abuse     .5 PPD smoker  . Anxiety   . Depression   . Shortness of breath   . Arthritis    Past Surgical History  Procedure Laterality Date  . Abdominal hysterectomy     Family History  Problem Relation Age of Onset  . Heart disease Mother 84  . Hypertension Mother   . Heart disease Father 34  . Aneurysm Brother   . Aneurysm Sister   . Colon cancer Neg Hx   . Esophageal cancer Neg Hx   . Stomach cancer Neg Hx    History  Substance Use Topics   . Smoking status: Current Every Day Smoker -- 0.50 packs/day for 20 years    Types: Cigarettes  . Smokeless tobacco: Current User    Types: Chew     Comment: did not want to discuss any stopping or decreasing  . Alcohol Use: No   OB History   Grav Para Term Preterm Abortions TAB SAB Ect Mult Living                 Review of Systems  All other systems reviewed and are negative.    Allergies  Ace inhibitors  Home Medications   Current Outpatient Rx  Name  Route  Sig  Dispense  Refill  . ACCU-CHEK FASTCLIX LANCETS MISC   Does not apply   1 each by Does not apply route 3 (three) times daily.   102 each   11   . amitriptyline (ELAVIL) 75 MG tablet      TAKE 1 TABLET BY MOUTH EVERY NIGHT AT BEDTIME   90 tablet   1     **Patient requests 90 days supply**   . aspirin EC 81 MG tablet   Oral   Take 81 mg by mouth every morning.         Marland Kitchen atorvastatin (LIPITOR) 40 MG tablet   Oral   Take 1 tablet (40 mg total) by mouth daily.   30 tablet   6   .  exenatide (BYETTA 5 MCG PEN) 5 MCG/0.02ML SOPN   Subcutaneous   Inject 0.04 mLs (10 mcg total) into the skin 2 (two) times daily with a meal.   1.2 mL   3   . insulin glargine (LANTUS) 100 UNIT/ML injection   Subcutaneous   Inject 55 Units into the skin at bedtime.   10 mL   12   . Insulin Pen Needle 31G X 6 MM MISC   Does not apply   1 each by Does not apply route 4 (four) times daily.   100 each   11   . Ipratropium-Albuterol (COMBIVENT) 20-100 MCG/ACT AERS respimat   Inhalation   Inhale 1 puff into the lungs every 6 (six) hours as needed for wheezing.   4 g   12   . losartan (COZAAR) 50 MG tablet      TAKE 1 TABLET BY MOUTH EVERY DAY   30 tablet   0   . meclizine (ANTIVERT) 12.5 MG tablet   Oral   Take 1 tablet (12.5 mg total) by mouth 3 (three) times daily as needed.   30 tablet   0   . metFORMIN (GLUCOPHAGE) 500 MG tablet   Oral   Take 1 tablet (500 mg total) by mouth 2 (two) times daily with  a meal.   60 tablet   3   . nitroGLYCERIN (NITRODUR - DOSED IN MG/24 HR) 0.2 mg/hr   Transdermal   Place 1 patch onto the skin daily.         . Olopatadine HCl (PATADAY) 0.2 % SOLN   Ophthalmic   Apply 1 drop to eye daily. (Right eye only)   1 Bottle   1   . sertraline (ZOLOFT) 100 MG tablet   Oral   Take 1.5 tablets (150 mg total) by mouth daily.   45 tablet   6   . traMADol (ULTRAM) 50 MG tablet   Oral   Take 1 tablet (50 mg total) by mouth every 6 (six) hours as needed for pain.   15 tablet   0   . traZODone (DESYREL) 100 MG tablet   Oral   Take 200 mg by mouth at bedtime.          BP 142/84  Pulse 95  Temp(Src) 98.7 F (37.1 C) (Oral)  Resp 18  SpO2 96% Physical Exam  Nursing note and vitals reviewed. Constitutional: She is oriented to person, place, and time. She appears well-developed and well-nourished. No distress.  Awake, alert, nontoxic appearance  HENT:  Head: Normocephalic and atraumatic.  Mouth/Throat: Oropharynx is clear and moist.  Eyes: Conjunctivae and EOM are normal. Pupils are equal, round, and reactive to light. Right eye exhibits no discharge. Left eye exhibits no discharge.  Neck: Normal range of motion. Neck supple.  Cardiovascular: Normal rate and regular rhythm.   Pulmonary/Chest: Effort normal. No respiratory distress. She exhibits no tenderness (tenderness to right  anterolateral ribs on palpation with out crepitus, emphysema, or deformity noted. No bruising.).  Abdominal: Soft. There is no tenderness. There is no rebound.  Musculoskeletal: She exhibits no tenderness (Tenderness to right paralumbar region without overlying skin changes. No significant midline spine tenderness or crepitus, step-off.).  ROM appears intact, no obvious focal weakness  Neurological: She is alert and oriented to person, place, and time.  Mental status and motor strength appears intact  Romberg negative, 5/5 strength to all 4 extremities, normal gait. No  foot drop.  Patellar deep tendon reflex 2+  bilaterally.  Skin: No rash noted.  Abrasion noted to dorsum self right wrist. Wrist is with full range of motion, minimal tenderness, no deformity.  Psychiatric: She has a normal mood and affect.    ED Course  Procedures (including critical care time)  3:51 PM Patient fell due to "my knee gave out". No precipitating symptoms. Will x-ray the affected areas. Will also check an i-STAT to evaluate H&H and electrolytes. The patient otherwise able to ambulate, does not appear dehydrated, has no focal neuro deficit.  5:12 PM Rib xray with a suspect nondisplaced R 5th rib fx.  This is the same location as pt's pain.  No evidence of pneumothorax.  Definitive rib fx care were discussed including NSAIDs, use pillow against chest when cough, using incentive spirometer.  Incentive spirometer offered with appropriate patient education.  Otherwise, pt is at baseline mentally, normal H&H and electrolytes.    Labs Reviewed - No data to display Dg Ribs Unilateral W/chest Right  07/16/2013   *RADIOLOGY REPORT*  Clinical Data: Fall.  Right anterior rib pain radiating laterally.  RIGHT RIBS AND CHEST - 3+ VIEW  Comparison: 03/07/2013, 06/03/2011  Findings: Heart, mediastinal, and hilar contours are normal.  The trachea is midline.  Pulmonary vascularity is normal.  The lungs are clear.  There is no airspace disease, pleural effusion, or pneumothorax.  On the oblique view of the right ribs, there is subtle irregularity focally in the lateral cortex of the anterior right fifth rib for which a nondisplaced acute fracture cannot be excluded.  No displaced rib fractures are seen.  Calcifications projecting over the right upper quadrant could reflect gallstones, and appear similar to a chest radiograph of 06/03/2011.  IMPRESSION:  1.  Subtle irregularity of the anterior right fifth rib seen on one of the views, for which a subtle nondisplaced acute fracture cannot be excluded.   This is not definite.  No displaced rib fracture is identified. 2.  No acute cardiopulmonary disease. 3.  Possible cholelithiasis.   Original Report Authenticated By: Britta Mccreedy, M.D.   Dg Lumbar Spine Complete  07/16/2013   *RADIOLOGY REPORT*  Clinical Data: Low back pain  LUMBAR SPINE - COMPLETE 4+ VIEW  Comparison: None.  Findings: Five lumbar type vertebral bodies are visualized.  Mild osteophytic changes are noted at L3-4.  No pars defects are identified.  No compression deformities are seen.  Multiple right renal calculi are identified.  Largest of these measures 19 mm in dimension.  No anterolisthesis or retrolisthesis is noted.  IMPRESSION: Degenerative changes are noted.  Right renal calculi.   Original Report Authenticated By: Alcide Clever, M.D.   Dg Wrist Complete Right  07/16/2013   *RADIOLOGY REPORT*  Clinical Data: Right wrist pain following injury  RIGHT WRIST - COMPLETE 3+ VIEW  Comparison: None.  Findings: No acute fracture or dislocation is noted.  No soft tissue abnormality is seen.  IMPRESSION: No acute abnormality noted.   Original Report Authenticated By: Alcide Clever, M.D.   1. Rib fracture, right, closed, initial encounter     MDM  BP 142/84  Pulse 95  Temp(Src) 98.7 F (37.1 C) (Oral)  Resp 18  SpO2 96%  I have reviewed nursing notes and vital signs. I personally reviewed the imaging tests through PACS system  I reviewed available ER/hospitalization records thought the EMR   Fayrene Helper, [place] 07/16/13 1813

## 2013-07-16 NOTE — ED Notes (Signed)
Pt reports she has been feeling weak x1-2 weeks, has not been able to eat anything. States her left leg "gave out" today, she fell forward, landed on right side. C/o right leg pain. Pt ambulated to room without staff assist

## 2013-07-16 NOTE — Progress Notes (Signed)
Case discussed with Dr. Qureshi soon after the resident saw the patient.  We reviewed the resident's history and exam and pertinent patient test results.  I agree with the assessment, diagnosis, and plan of care documented in the resident's note. 

## 2013-07-17 NOTE — ED Provider Notes (Signed)
Medical screening examination/treatment/procedure(s) were performed by non-physician practitioner and as supervising physician I was immediately available for consultation/collaboration.   Dixon Luczak Ann Onia Shiflett, MD 07/17/13 1005 

## 2013-07-18 ENCOUNTER — Ambulatory Visit (INDEPENDENT_AMBULATORY_CARE_PROVIDER_SITE_OTHER): Payer: Medicare Other | Admitting: Internal Medicine

## 2013-07-18 ENCOUNTER — Encounter: Payer: Self-pay | Admitting: Internal Medicine

## 2013-07-18 VITALS — BP 124/74 | HR 71 | Temp 97.7°F | Ht 67.0 in | Wt 187.0 lb

## 2013-07-18 DIAGNOSIS — S2241XS Multiple fractures of ribs, right side, sequela: Secondary | ICD-10-CM

## 2013-07-18 DIAGNOSIS — IMO0002 Reserved for concepts with insufficient information to code with codable children: Secondary | ICD-10-CM

## 2013-07-18 DIAGNOSIS — E119 Type 2 diabetes mellitus without complications: Secondary | ICD-10-CM

## 2013-07-18 DIAGNOSIS — R5383 Other fatigue: Secondary | ICD-10-CM

## 2013-07-18 DIAGNOSIS — S2249XA Multiple fractures of ribs, unspecified side, initial encounter for closed fracture: Secondary | ICD-10-CM | POA: Insufficient documentation

## 2013-07-18 DIAGNOSIS — R531 Weakness: Secondary | ICD-10-CM | POA: Insufficient documentation

## 2013-07-18 DIAGNOSIS — R5381 Other malaise: Secondary | ICD-10-CM

## 2013-07-18 LAB — GLUCOSE, CAPILLARY
Glucose-Capillary: 56 mg/dL — ABNORMAL LOW (ref 70–99)
Glucose-Capillary: 157 mg/dL — ABNORMAL HIGH (ref 70–99)
Glucose-Capillary: 69 mg/dL — ABNORMAL LOW (ref 70–99)

## 2013-07-18 MED ORDER — LIDOCAINE 5 % EX PTCH: 1.0000 | MEDICATED_PATCH | Freq: Two times a day (BID) | CUTANEOUS | Status: DC

## 2013-07-18 MED ORDER — INSULIN GLARGINE 100 UNIT/ML ~~LOC~~ SOLN: 25.0000 [IU] | Freq: Every day | SUBCUTANEOUS | Status: DC

## 2013-07-18 MED ORDER — DICLOFENAC SODIUM 1 % TD GEL: 4.0000 g | Freq: Four times a day (QID) | TRANSDERMAL | Status: DC

## 2013-07-18 NOTE — Patient Instructions (Addendum)
Stop taking Byetta (exenatide)  Decrease Lantus to 25 units at night  Apply Lidoderm patches to painful area (1 patch per day), remove after 12 hours  Apply Voltaren gel to painful area every 6 hours as needed for pain  Follow up with [facility] on Friday 07-20-13 to recheck blood glucose and consider stronger pain medicine  Hypoglycemia (Low Blood Sugar) Hypoglycemia is when the glucose (sugar) in your blood is too low. Hypoglycemia can happen for many reasons. It can happen to people with or without diabetes. Hypoglycemia can develop quickly and can be a medical emergency.  CAUSES  Having hypoglycemia does not mean that you will develop diabetes. Different causes include:  Missed or delayed meals or not enough carbohydrates eaten.  Medication overdose. This could be by accident or deliberate. If by accident, your medication may need to be adjusted or changed.  Exercise or increased activity without adjustments in carbohydrates or medications.  A nerve disorder that affects body functions like your heart rate, blood pressure and digestion (autonomic neuropathy).  A condition where the stomach muscles do not function properly (gastroparesis). Therefore, medications may not absorb properly.  The inability to recognize the signs of hypoglycemia (hypoglycemic unawareness).  Absorption of insulin  may be altered.  Alcohol consumption.  Pregnancy/menstrual cycles/postpartum. This may be due to hormones.  Certain kinds of tumors. This is very rare. SYMPTOMS   Sweating.  Hunger.  Dizziness.  Blurred vision.  Drowsiness.  Weakness.  Headache.  Rapid heart beat.  Shakiness.  Nervousness. DIAGNOSIS  Diagnosis is made by monitoring blood glucose in one or all of the following ways:  Fingerstick blood glucose monitoring.  Laboratory results. TREATMENT  If you think your blood glucose is low:  Check your blood glucose, if possible. If it is less than 70 mg/dl, take one of  the following:  3-4 glucose tablets.   cup juice (prefer clear like apple).   cup "regular" soda pop.  1 cup milk.  -1 tube of glucose gel.  5-6 hard candies.  Do not over treat because your blood glucose (sugar) will only go too high.  Wait 15 minutes and recheck your blood glucose. If it is still less than 70 mg/dl (or below your target range), repeat treatment.  Eat a snack if it is more than one hour until your next meal. Sometimes, your blood glucose may go so low that you are unable to treat yourself. You may need someone to help you. You may even pass out or be unable to swallow. This may require you to get an injection of glucagon, which raises the blood glucose. HOME CARE INSTRUCTIONS  Check blood glucose as recommended by your caregiver.  Take medication as prescribed by your caregiver.  Follow your meal plan. Do not skip meals. Eat on time.  If you are going to drink alcohol, drink it only with meals.  Check your blood glucose before driving.  Check your blood glucose before and after exercise. If you exercise longer or different than usual, be sure to check blood glucose more frequently.  Always carry treatment with you. Glucose tablets are the easiest to carry.  Always wear medical alert jewelry or carry some form of identification that states that you have diabetes. This will alert people that you have diabetes. If you have hypoglycemia, they will have a better idea on what to do. SEEK MEDICAL CARE IF:   You are having problems keeping your blood sugar at target range.  You are having frequent  episodes of hypoglycemia.  You feel you might be having side effects from your medicines.  You have symptoms of an illness that is not improving after 3-4 days.  You notice a change in vision or a new problem with your vision. SEEK IMMEDIATE MEDICAL CARE IF:   You are a family member or friend of a person whose blood glucose goes below 70 mg/dl and is  accompanied by:  Confusion.  A change in mental status.  The inability to swallow.  Passing out. Document Released: 12/13/2005 Document Revised: 03/06/2012 Document Reviewed: 04/10/2012 [facility] Patient Information 2014 [place], [place].

## 2013-07-18 NOTE — Progress Notes (Signed)
1433PM- CBG 69; 3 more Glucose tabs and cola given - MD awared.  States she had insulin this morning but did not eat breakfast nor lunch. Re-enforce the importance Of eating w/insulin. She nodded her head.

## 2013-07-18 NOTE — Progress Notes (Signed)
1406PM - CBG 56; pt refused OJ/crackers - 3 Glucose tabs given.

## 2013-07-18 NOTE — Assessment & Plan Note (Signed)
A:  The patients chest pain is likely secondary to recent rib fracture.  P:  We recommended that the patient try voltaren gel and lidoderm patches. Once the patients hypoglycemia and weakness resolve, more potent systemic pain medications may be considered. The patient is scheduled for f/u in 2 days on Friday.

## 2013-07-18 NOTE — Assessment & Plan Note (Addendum)
A:  The patients weakness is likely secondary to hypoglycemia(50's at visit today which responded well to PO Coke). The patients hypoglycemia is thought to be secondary to insulin use in the setting of decreased oral intake of food.  We are uncertain of the etiology of the patients poor oral intake. A possible cause of this would be as a complication of hyperparathyroidism.  P:  Today we decreased the patients lantus to 25 units at bedtime (from 55 units) and discontinued her Byetta. We have previously referred the patient to endocrinology for management of hyperparathyroidism. She has yet to be scheduled for this appointment. We plan to attempt to find an endocrinologist that will see the patient. We plan to f.u with the patient in 2 days to re-check her blood sugar. We hope to have an endocrinology appt at this time.

## 2013-07-18 NOTE — Progress Notes (Signed)
Subjective:   Patient ID: Susan Holmes female   DOB: 1956/03/23 57 y.o.   MRN: [id number]  HPI: [name] is a 57 y.o. female with a pmhx detailed below who comes to the clinic today with a cc of rib pain. Of note, the patient fell on Monday and was found to have a rib fracture in the ED. The patient was d/c with tramadolfor pain control. The patient states that for the last 2 months she has been unable to eat. She denies nausea and vomitting. She states that she just gets full too fast and then cannot eat. She reports eating small amounts of fruits and vegetables over the last few days. She is able to drink fluids without problems. The patient states that her blood sugar has been erratic over this timer period 60's - 200's. The patient take Byetta and Lantus 55 units at nighttime. The patient has only fallen the one time before her ED visit. She describes the fall as being secondary to weakness in her legs. She denies LOC, palpitations, biting her tongue, and incontinence during the episode. In the ED, the patients glucose was 64.   The patient is having 10/10 pain. The pain is constant but has intermittent spikes of pain when she moves. Nothing seems to make the pain better. The patient has tried Bengay and tramadol with no relief. The patient has not tried NSAIDs because she says that they do not work for her.    Past Medical History  Diagnosis Date  . Hepatitis B infection pt unsure    resolved  . Diabetes mellitus, type II   . HTN (hypertension)   . Hyperlipidemia   . Congenital blindness     L eye  . Tobacco abuse     .5 PPD smoker  . Anxiety   . Depression   . Shortness of breath   . Arthritis    Current Outpatient Prescriptions  Medication Sig Dispense Refill  . ACCU-CHEK FASTCLIX LANCETS MISC 1 each by Does not apply route 3 (three) times daily.  102 each  11  . amitriptyline (ELAVIL) 75 MG tablet TAKE 1 TABLET BY MOUTH EVERY NIGHT AT BEDTIME  90 tablet  1  .  aspirin EC 81 MG tablet Take 81 mg by mouth every morning.      Marland Kitchen atorvastatin (LIPITOR) 40 MG tablet Take 1 tablet (40 mg total) by mouth daily.  30 tablet  6  . diclofenac sodium (VOLTAREN) 1 % GEL Apply 4 g topically 4 (four) times daily.  1 Tube  2  . insulin glargine (LANTUS) 100 UNIT/ML injection Inject 0.25 mLs (25 Units total) into the skin at bedtime.  10 mL  12  . Insulin Pen Needle 31G X 6 MM MISC 1 each by Does not apply route 4 (four) times daily.  100 each  11  . Ipratropium-Albuterol (COMBIVENT) 20-100 MCG/ACT AERS respimat Inhale 1 puff into the lungs every 6 (six) hours as needed for wheezing.  4 g  12  . lidocaine (LIDODERM) 5 % Place 1 patch onto the skin every 12 (twelve) hours. Remove & Discard patch within 12 hours or as directed by MD  10 patch  0  . losartan (COZAAR) 50 MG tablet TAKE 1 TABLET BY MOUTH EVERY DAY  30 tablet  0  . meclizine (ANTIVERT) 12.5 MG tablet Take 1 tablet (12.5 mg total) by mouth 3 (three) times daily as needed.  30 tablet  0  . metFORMIN (  GLUCOPHAGE) 500 MG tablet Take 1 tablet (500 mg total) by mouth 2 (two) times daily with a meal.  60 tablet  3  . nitroGLYCERIN (NITRODUR - DOSED IN MG/24 HR) 0.2 mg/hr Place 1 patch onto the skin daily.      . Olopatadine HCl (PATADAY) 0.2 % SOLN Apply 1 drop to eye daily. (Right eye only)  1 Bottle  1  . sertraline (ZOLOFT) 100 MG tablet Take 1.5 tablets (150 mg total) by mouth daily.  45 tablet  6  . traMADol (ULTRAM) 50 MG tablet Take 1 tablet (50 mg total) by mouth every 6 (six) hours as needed for pain.  15 tablet  0  . traZODone (DESYREL) 100 MG tablet Take 200 mg by mouth at bedtime.       No current facility-administered medications for this visit.   Family History  Problem Relation Age of Onset  . Heart disease Mother 39  . Hypertension Mother   . Heart disease Father 58  . Aneurysm Brother   . Aneurysm Sister   . Colon cancer Neg Hx   . Esophageal cancer Neg Hx   . Stomach cancer Neg Hx     History   Social History  . Marital Status: Divorced    Spouse Name: N/A    Number of Children: N/A  . Years of Education: N/A   Social History Main Topics  . Smoking status: Current Every Day Smoker -- 0.50 packs/day for 20 years    Types: Cigarettes  . Smokeless tobacco: Current User    Types: Chew     Comment: did not want to discuss any stopping or decreasing  . Alcohol Use: No  . Drug Use: No  . Sexually Active: Not Currently    Birth Control/ Protection: Surgical   Other Topics Concern  . None   Social History Narrative  . None   Review of Systems:  Pertinent items are noted in HPI.  Objective:  Physical Exam: Filed Vitals:   07/18/13 1335  BP: 124/74  Pulse: 71  Temp: 97.7 F (36.5 C)  TempSrc: Oral  Height: 5\' 7"  (1.702 m)  Weight: 187 lb (84.823 kg)  SpO2: 96%   Physical Exam  Constitutional: She is oriented to person, place, and time. She appears well-developed and well-nourished.  HENT:  Head: Normocephalic and atraumatic.  Mouth/Throat: No oropharyngeal exudate.  Eyes: Conjunctivae and EOM are normal.  Injection OD,  Neck: Normal range of motion. Neck supple. No thyromegaly present.  Cardiovascular: Normal rate, regular rhythm, normal heart sounds and intact distal pulses.  Exam reveals no friction rub.   No murmur heard. Mildly tachycardic  Pulmonary/Chest: Effort normal and breath sounds normal. No respiratory distress. She has no wheezes. She has no rales. She exhibits tenderness.  Tender to palpation along the right chest wall  Abdominal: Soft. Bowel sounds are normal. She exhibits no distension. There is no tenderness.  Lymphadenopathy:    She has no cervical adenopathy.  Neurological: She is alert and oriented to person, place, and time.  Psychiatric:  Depressed mood and affect with occasional outburst of pain.     Assessment & Plan:

## 2013-07-19 ENCOUNTER — Encounter (HOSPITAL_COMMUNITY)
Admission: RE | Admit: 2013-07-19 | Discharge: 2013-07-19 | Disposition: A | Discharge status: Home / Self Care | Payer: Medicare Other | Source: Ambulatory Visit | Attending: Internal Medicine | Admitting: Internal Medicine

## 2013-07-19 DIAGNOSIS — E210 Primary hyperparathyroidism: Secondary | ICD-10-CM | POA: Insufficient documentation

## 2013-07-19 MED ORDER — TECHNETIUM TC 99M SESTAMIBI GENERIC - CARDIOLITE
25.0000 | Freq: Once | INTRAVENOUS | Status: AC | PRN
  Administered 2013-07-19: 25 via INTRAVENOUS

## 2013-07-20 ENCOUNTER — Telehealth: Payer: Self-pay | Admitting: *Deleted

## 2013-07-20 ENCOUNTER — Ambulatory Visit (INDEPENDENT_AMBULATORY_CARE_PROVIDER_SITE_OTHER): Payer: Medicare Other | Admitting: Internal Medicine

## 2013-07-20 ENCOUNTER — Encounter: Payer: Self-pay | Admitting: Internal Medicine

## 2013-07-20 VITALS — BP 118/80 | HR 112 | Temp 97.8°F | Ht 67.0 in | Wt 186.8 lb

## 2013-07-20 DIAGNOSIS — E213 Hyperparathyroidism, unspecified: Secondary | ICD-10-CM

## 2013-07-20 DIAGNOSIS — IMO0002 Reserved for concepts with insufficient information to code with codable children: Secondary | ICD-10-CM

## 2013-07-20 DIAGNOSIS — S2242XS Multiple fractures of ribs, left side, sequela: Secondary | ICD-10-CM

## 2013-07-20 DIAGNOSIS — R5381 Other malaise: Secondary | ICD-10-CM

## 2013-07-20 DIAGNOSIS — R5383 Other fatigue: Secondary | ICD-10-CM

## 2013-07-20 DIAGNOSIS — R531 Weakness: Secondary | ICD-10-CM

## 2013-07-20 LAB — CBC WITH DIFFERENTIAL/PLATELET
Basophils Absolute: 0 10*3/uL (ref 0.0–0.1)
Basophils Relative: 0 % (ref 0–1)
Eosinophils Absolute: 0.1 10*3/uL (ref 0.0–0.7)
Eosinophils Relative: 2 % (ref 0–5)
HCT: 37.7 % (ref 36.0–46.0)
Hemoglobin: 12 g/dL (ref 12.0–15.0)
Lymphocytes Relative: 34 % (ref 12–46)
Lymphs Abs: 2.2 10*3/uL (ref 0.7–4.0)
MCH: 22 pg — ABNORMAL LOW (ref 26.0–34.0)
MCHC: 31.8 g/dL (ref 30.0–36.0)
MCV: 69 fL — ABNORMAL LOW (ref 78.0–100.0)
Monocytes Absolute: 0.5 10*3/uL (ref 0.1–1.0)
Monocytes Relative: 5 % (ref 3–12)
Neutro Abs: 3.6 10*3/uL (ref 1.7–7.7)
Neutrophils Relative %: 56 % (ref 43–77)
Platelets: 275 10*3/uL (ref 150–400)
RBC: 5.46 MIL/uL — ABNORMAL HIGH (ref 3.87–5.11)
RDW: 16.3 % — ABNORMAL HIGH (ref 11.5–15.5)
WBC: 6.5 10*3/uL (ref 4.0–10.5)

## 2013-07-20 LAB — BASIC METABOLIC PANEL
BUN: 7 mg/dL (ref 6–23)
CO2: 24 mEq/L (ref 19–32)
Calcium: 11.1 mg/dL — ABNORMAL HIGH (ref 8.4–10.5)
Chloride: 98 mEq/L (ref 96–112)
Creat: 0.71 mg/dL (ref 0.50–1.10)
Glucose, Bld: 120 mg/dL — ABNORMAL HIGH (ref 70–99)
Potassium: 4.1 mEq/L (ref 3.5–5.3)
Sodium: 137 mEq/L (ref 135–145)

## 2013-07-20 LAB — GLUCOSE, CAPILLARY: Glucose-Capillary: 143 mg/dL — ABNORMAL HIGH (ref 70–99)

## 2013-07-20 MED ORDER — HYDROCODONE-ACETAMINOPHEN 2.5-325 MG PO TABS: 1.0000 | ORAL_TABLET | Freq: Three times a day (TID) | ORAL | Status: DC

## 2013-07-20 NOTE — Patient Instructions (Addendum)
Take 1 of a Vicodin three times a day as needed for pain.  Follow up with Dr. Sharl Ma (September 8th, 2014) about potential surgery for hyperparathyroid.

## 2013-07-20 NOTE — Progress Notes (Signed)
Subjective:   Patient ID: Susan Holmes female   DOB: June 26, 1956 57 y.o.   MRN: [id number]  HPI: [name] is a 57 y.o. female with a pmhx detailed below who comes to the clinic today with a cc of rib pain. Of note, the patient fell on Monday and was found to have a rib fracture in the ED. The patient was d/c with tramadol for pain control. We elected to try topical pain treatment with lidoderm patches and voltaren gel. The patient continues to be a 10/10 pain. The pain is constant but has intermittent spikes of pain when she moves. Nothing seems to make the pain better. The patient has tried [place], tramadol, and tylenol with no relief. The patient has not tried NSAIDs because she says that they do not work for her.   At the visit two days ago, we decreased her lantus to 25 units at night because of hypoglycemia thought to be 2/2 decrease PO intake of food. Since we made this change the patient denies problems of low blood sugar. However, the patient continues to have weakness and anorexia. She is only able to eat minimal amounts of food. She denies trouble swallowing, nausea, and vomiting. She states that she just doesn't feel like eating.  Of note, imaging performed yesterday has identified a potential parathyroid adenoma.  The patient denies dysuria, frequency, urgency. The patient denies substernal chest pain and belly pain.    Past Medical History  Diagnosis Date  . Hepatitis B infection pt unsure    resolved  . Diabetes mellitus, type II   . HTN (hypertension)   . Hyperlipidemia   . Congenital blindness     L eye  . Tobacco abuse     .5 PPD smoker  . Anxiety   . Depression   . Shortness of breath   . Arthritis    Current Outpatient Prescriptions  Medication Sig Dispense Refill  . ACCU-CHEK FASTCLIX LANCETS MISC 1 each by Does not apply route 3 (three) times daily.  102 each  11  . amitriptyline (ELAVIL) 75 MG tablet TAKE 1 TABLET BY MOUTH EVERY NIGHT AT BEDTIME  90  tablet  1  . aspirin EC 81 MG tablet Take 81 mg by mouth every morning.      Marland Kitchen atorvastatin (LIPITOR) 40 MG tablet Take 1 tablet (40 mg total) by mouth daily.  30 tablet  6  . diclofenac sodium (VOLTAREN) 1 % GEL Apply 4 g topically 4 (four) times daily.  1 Tube  2  . Hydrocodone-Acetaminophen 2.5-325 MG TABS Take 1 tablet by mouth 3 (three) times daily.  21 tablet  0  . insulin glargine (LANTUS) 100 UNIT/ML injection Inject 0.25 mLs (25 Units total) into the skin at bedtime.  10 mL  12  . Insulin Pen Needle 31G X 6 MM MISC 1 each by Does not apply route 4 (four) times daily.  100 each  11  . Ipratropium-Albuterol (COMBIVENT) 20-100 MCG/ACT AERS respimat Inhale 1 puff into the lungs every 6 (six) hours as needed for wheezing.  4 g  12  . lidocaine (LIDODERM) 5 % Place 1 patch onto the skin every 12 (twelve) hours. Remove & Discard patch within 12 hours or as directed by MD  10 patch  0  . losartan (COZAAR) 50 MG tablet TAKE 1 TABLET BY MOUTH EVERY DAY  30 tablet  0  . meclizine (ANTIVERT) 12.5 MG tablet Take 1 tablet (12.5 mg total) by mouth  3 (three) times daily as needed.  30 tablet  0  . metFORMIN (GLUCOPHAGE) 500 MG tablet Take 1 tablet (500 mg total) by mouth 2 (two) times daily with a meal.  60 tablet  3  . nitroGLYCERIN (NITRODUR - DOSED IN MG/24 HR) 0.2 mg/hr Place 1 patch onto the skin daily.      . Olopatadine HCl (PATADAY) 0.2 % SOLN Apply 1 drop to eye daily. (Right eye only)  1 Bottle  1  . sertraline (ZOLOFT) 100 MG tablet Take 1.5 tablets (150 mg total) by mouth daily.  45 tablet  6  . traMADol (ULTRAM) 50 MG tablet Take 1 tablet (50 mg total) by mouth every 6 (six) hours as needed for pain.  15 tablet  0  . traZODone (DESYREL) 100 MG tablet Take 200 mg by mouth at bedtime.       No current facility-administered medications for this visit.   Family History  Problem Relation Age of Onset  . Heart disease Mother 78  . Hypertension Mother   . Heart disease Father 89  . Aneurysm  Brother   . Aneurysm Sister   . Colon cancer Neg Hx   . Esophageal cancer Neg Hx   . Stomach cancer Neg Hx    History   Social History  . Marital Status: Divorced    Spouse Name: N/A    Number of Children: N/A  . Years of Education: N/A   Social History Main Topics  . Smoking status: Current Every Day Smoker -- 0.50 packs/day for 20 years    Types: Cigarettes  . Smokeless tobacco: Current User    Types: Chew     Comment: did not want to discuss any stopping or decreasing  . Alcohol Use: No  . Drug Use: No  . Sexually Active: Not Currently    Birth Control/ Protection: Surgical   Other Topics Concern  . Not on file   Social History Narrative  . No narrative on file   Review of Systems:  Pertinent items are noted in HPI.  Objective:  Physical Exam: Filed Vitals:   07/20/13 1016  BP: 118/80  Pulse: 112  Temp: 97.8 F (36.6 C)  TempSrc: Oral  Height: 5\' 7"  (1.702 m)  Weight: 186 lb 12.8 oz (84.732 kg)  SpO2: 97%   Physical Exam  Constitutional: She is oriented to person, place, and time. She appears well-developed and well-nourished.  HENT:  Head: Normocephalic and atraumatic.  Mouth/Throat: No oropharyngeal exudate.  Eyes: Conjunctivae and EOM are normal.  Injection OD,  Neck: Normal range of motion. Neck supple. No thyromegaly present.  Cardiovascular: Normal rate, regular rhythm, normal heart sounds and intact distal pulses.  Exam reveals no friction rub.   No murmur heard. Mildly tachycardic  Pulmonary/Chest: Effort normal and breath sounds normal. No respiratory distress. She has no wheezes. She has no rales. She exhibits tenderness.  Tender to palpation along the right chest wall  Abdominal: Soft. Bowel sounds are normal. She exhibits no distension. There is no tenderness.  Musculoskeletal:  No costovertebral tenderness.  Lymphadenopathy:    She has no cervical adenopathy.  Neurological: She is alert and oriented to person, place, and time.    Psychiatric:  Depressed mood and affect with occasional outburst of pain.     Assessment & Plan:

## 2013-07-20 NOTE — Progress Notes (Signed)
I saw patient and discussed her care with Dr. Glendell Docker at the time of the visit.  We reviewed the resident's history and exam and pertinent patient test results.  I agree with the assessment, diagnosis, and plan of care documented in the resident's note.

## 2013-07-20 NOTE — Assessment & Plan Note (Addendum)
BMET    Component Value Date/Time   NA 137 07/20/2013 1045   K 4.1 07/20/2013 1045   CL 98 07/20/2013 1045   CO2 24 07/20/2013 1045   GLUCOSE 120* 07/20/2013 1045   BUN 7 07/20/2013 1045   CREATININE 0.71 07/20/2013 1045   CREATININE 0.80 07/16/2013 1647   CALCIUM 11.1* 07/20/2013 1045   CALCIUM 11.5* 03/06/2013 1653   GFRNONAA 57* 03/07/2013 0500   GFRAA 66* 03/07/2013 0500   CBC    Component Value Date/Time   WBC 6.5 07/20/2013 1045   RBC 5.46* 07/20/2013 1045   HGB 12.0 07/20/2013 1045   HCT 37.7 07/20/2013 1045   PLT 275 07/20/2013 1045   MCV 69.0* 07/20/2013 1045   MCH 22.0* 07/20/2013 1045   MCHC 31.8 07/20/2013 1045   RDW 16.3* 07/20/2013 1045   LYMPHSABS 2.2 07/20/2013 1045   MONOABS 0.5 07/20/2013 1045   EOSABS 0.1 07/20/2013 1045   BASOSABS 0.0 07/20/2013 1045      A:  The patients weakness has not resolved since decreasing lantus to 25.   A possible cause of the patients weakness and anorexia would be hypercalcemia or anemia. Today we checked a BMET and CBC while the patient was in the clinic. The patient has hypercalcemia (11.1) that we believe could be a component of the patients symptoms but is unlikely to be the entire cause. The patient takes trazodone, sertraline, and amitriptyline for depression. We believe that these medications could also be contributing to the patients status.  P:   The patient has been referred to Dr. Sharl Ma (Sept 8th) for further evaluation and rec's regarding potential surgery. The patient was instructed to make an appointment with [facility] for rec's regarding psychotropic medications. We have ordered labs and will f/u as indicated.

## 2013-07-20 NOTE — ED Provider Notes (Signed)
CSN: [id number]     Arrival date & time 07/12/13  1308 History     First MD Initiated Contact with Patient 07/12/13 1352     Chief Complaint  Patient presents with  . Leg Pain   (Consider location/radiation/quality/duration/timing/severity/associated sxs/prior Treatment) Patient is a 57 y.o. female presenting with leg pain. The history is provided by the patient. No language interpreter was used.  Leg Pain Location:  Leg Time since incident:  1 week Leg location:  L leg Associated symptoms: no fever   Associated symptoms comment:  Pain and swelling to left leg for one week. She denies SOB, chest pain, fever, or known injury.   Past Medical History  Diagnosis Date  . Hepatitis B infection pt unsure    resolved  . Diabetes mellitus, type II   . HTN (hypertension)   . Hyperlipidemia   . Congenital blindness     L eye  . Tobacco abuse     .5 PPD smoker  . Anxiety   . Depression   . Shortness of breath   . Arthritis    Past Surgical History  Procedure Laterality Date  . Abdominal hysterectomy     Family History  Problem Relation Age of Onset  . Heart disease Mother 52  . Hypertension Mother   . Heart disease Father 55  . Aneurysm Brother   . Aneurysm Sister   . Colon cancer Neg Hx   . Esophageal cancer Neg Hx   . Stomach cancer Neg Hx    History  Substance Use Topics  . Smoking status: Current Every Day Smoker -- 0.50 packs/day for 20 years    Types: Cigarettes  . Smokeless tobacco: Current User    Types: Chew     Comment: did not want to discuss any stopping or decreasing  . Alcohol Use: No   OB History   Grav Para Term Preterm Abortions TAB SAB Ect Mult Living                 Review of Systems  Constitutional: Negative for fever and chills.  Respiratory: Negative.  Negative for shortness of breath.   Cardiovascular: Negative.  Negative for chest pain.  Musculoskeletal:       See HPI.  Skin: Negative.  Negative for wound.  Neurological: Negative.   Negative for numbness.    Allergies  Ace inhibitors  Home Medications   Current Outpatient Rx  Name  Route  Sig  Dispense  Refill  . ACCU-CHEK FASTCLIX LANCETS MISC   Does not apply   1 each by Does not apply route 3 (three) times daily.   102 each   11   . amitriptyline (ELAVIL) 75 MG tablet      TAKE 1 TABLET BY MOUTH EVERY NIGHT AT BEDTIME   90 tablet   1     **Patient requests 90 days supply**   . aspirin EC 81 MG tablet   Oral   Take 81 mg by mouth every morning.         Marland Kitchen atorvastatin (LIPITOR) 40 MG tablet   Oral   Take 1 tablet (40 mg total) by mouth daily.   30 tablet   6   . Insulin Pen Needle 31G X 6 MM MISC   Does not apply   1 each by Does not apply route 4 (four) times daily.   100 each   11   . Ipratropium-Albuterol (COMBIVENT) 20-100 MCG/ACT AERS respimat   Inhalation  Inhale 1 puff into the lungs every 6 (six) hours as needed for wheezing.   4 g   12   . losartan (COZAAR) 50 MG tablet      TAKE 1 TABLET BY MOUTH EVERY DAY   30 tablet   0   . metFORMIN (GLUCOPHAGE) 500 MG tablet   Oral   Take 1 tablet (500 mg total) by mouth 2 (two) times daily with a meal.   60 tablet   3   . nitroGLYCERIN (NITRODUR - DOSED IN MG/24 HR) 0.2 mg/hr   Transdermal   Place 1 patch onto the skin daily.         . Olopatadine HCl (PATADAY) 0.2 % SOLN   Ophthalmic   Apply 1 drop to eye daily. (Right eye only)   1 Bottle   1   . sertraline (ZOLOFT) 100 MG tablet   Oral   Take 1.5 tablets (150 mg total) by mouth daily.   45 tablet   6   . traZODone (DESYREL) 100 MG tablet   Oral   Take 200 mg by mouth at bedtime.         . diclofenac sodium (VOLTAREN) 1 % GEL   Topical   Apply 4 g topically 4 (four) times daily.   1 Tube   2   . insulin glargine (LANTUS) 100 UNIT/ML injection   Subcutaneous   Inject 0.25 mLs (25 Units total) into the skin at bedtime.   10 mL   12   . lidocaine (LIDODERM) 5 %   Transdermal   Place 1 patch onto the  skin every 12 (twelve) hours. Remove & Discard patch within 12 hours or as directed by MD   10 patch   0   . meclizine (ANTIVERT) 12.5 MG tablet   Oral   Take 1 tablet (12.5 mg total) by mouth 3 (three) times daily as needed.   30 tablet   0   . traMADol (ULTRAM) 50 MG tablet   Oral   Take 1 tablet (50 mg total) by mouth every 6 (six) hours as needed for pain.   15 tablet   0    BP 134/76  Pulse 91  Temp(Src) 98.7 F (37.1 C) (Oral)  Resp 20  Ht 5' 7.5" (1.715 m)  Wt 190 lb (86.183 kg)  BMI 29.3 kg/m2  SpO2 100% Physical Exam  Constitutional: She is oriented to person, place, and time. She appears well-developed and well-nourished.  HENT:  Head: Normocephalic.  Neck: Normal range of motion. Neck supple.  Cardiovascular: Normal rate, regular rhythm and intact distal pulses.   Pulmonary/Chest: Effort normal and breath sounds normal.  Abdominal: Soft. Bowel sounds are normal. There is no tenderness. There is no rebound and no guarding.  Musculoskeletal: Normal range of motion.  Left lower extremity mildly swollen without redness or point tenderness. FROM.  Neurological: She is alert and oriented to person, place, and time.  Skin: Skin is warm and dry. No rash noted.  Psychiatric: She has a normal mood and affect.    ED Course   Procedures (including critical care time)  Labs Reviewed - No data to display Nm Parathyroid W/spect  07/19/2013   *RADIOLOGY REPORT*  Clinical Data:  Hyperparathyroidism.  Serum PTH 109.9 on 03/06/2013.  NM PARATHYROID SCINTIGRAPHY AND SPECT IMAGING  Technique:  Following intravenous administration of radiopharmaceutical, early and 2-hour delayed planar images were obtained in the anterior projection.  Delayed triplanar SPECT images were also obtained at  2 hours.  Radiopharmaceutical:  25 mCi Tc-16m Sestamibi IV  Comparison:  Neck CT 05/13/2011.  Findings: The initial planar images demonstrate minimally asymmetric paratracheal activity.  On the  delayed images, there is subtle asymmetric activity on the left.  On the delayed SPECT images, this localizes to the superior left paratracheal area. Although not definitive, this could reflect a small superior left parathyroid adenoma.  No abnormal activity seen within the superior mediastinum.  IMPRESSION: Subtle asymmetric delayed activity in the left paratracheal area is not definitive, although could reflect a small superior left parathyroid adenoma.   Original Report Authenticated By: Carey Bullocks, M.D.   1. Low back pain   2. Tobacco use disorder   3. Lower extremity pain, left     MDM  Negative DVT study. She can be discharged to f/u with PCP.  Arnoldo Hooker, PA-C 07/20/13 [phone]

## 2013-07-20 NOTE — Assessment & Plan Note (Signed)
A: The patient failed treatment of topical voltaren gel and lidoderm patches. She had previously failed treatment with tramadol and tylenol.  P: We recommended that the patient take 1/2 of a 5-325 Vicodin up to three times a day as needed for pain.

## 2013-07-20 NOTE — ED Provider Notes (Signed)
Medical screening examination/treatment/procedure(s) were performed by non-physician practitioner and as supervising physician I was immediately available for consultation/collaboration.  Donnetta Hutching, MD 07/20/13 1116

## 2013-07-23 LAB — PTH, INTACT AND CALCIUM
Calcium: 10.6 mg/dL — ABNORMAL HIGH (ref 8.4–10.5)
PTH: 61.4 pg/mL (ref 14.0–72.0)

## 2013-07-23 NOTE — Progress Notes (Signed)
I saw and evaluated the patient.  I personally confirmed the key portions of the history and exam documented by Dr. Komanski and I reviewed pertinent patient test results.  The assessment, diagnosis, and plan were formulated together and I agree with the documentation in the resident's note.  

## 2013-07-26 NOTE — Addendum Note (Signed)
Addended by: Hassan Buckler on: 07/26/2013 11:15 AM   Modules accepted: Orders

## 2013-07-27 ENCOUNTER — Other Ambulatory Visit: Payer: Self-pay | Admitting: Internal Medicine

## 2013-07-31 ENCOUNTER — Telehealth: Payer: Self-pay | Admitting: *Deleted

## 2013-07-31 NOTE — Telephone Encounter (Signed)
Pt c/o nausea and weakness.  Onset 2 months ago.  Same c/o on last visit 7/25. Pain improved but still not eating well.  Pt waiting on GI referral. Will see tomorrow AM

## 2013-08-01 ENCOUNTER — Ambulatory Visit (INDEPENDENT_AMBULATORY_CARE_PROVIDER_SITE_OTHER): Payer: Medicare Other | Admitting: Internal Medicine

## 2013-08-01 ENCOUNTER — Encounter: Payer: Self-pay | Admitting: Internal Medicine

## 2013-08-01 VITALS — BP 122/85 | HR 110 | Temp 97.0°F | Ht 67.0 in | Wt 182.8 lb

## 2013-08-01 DIAGNOSIS — E1149 Type 2 diabetes mellitus with other diabetic neurological complication: Secondary | ICD-10-CM

## 2013-08-01 DIAGNOSIS — R5381 Other malaise: Secondary | ICD-10-CM

## 2013-08-01 DIAGNOSIS — R531 Weakness: Secondary | ICD-10-CM

## 2013-08-01 DIAGNOSIS — IMO0002 Reserved for concepts with insufficient information to code with codable children: Secondary | ICD-10-CM

## 2013-08-01 DIAGNOSIS — R110 Nausea: Secondary | ICD-10-CM

## 2013-08-01 DIAGNOSIS — E213 Hyperparathyroidism, unspecified: Secondary | ICD-10-CM

## 2013-08-01 DIAGNOSIS — S2241XS Multiple fractures of ribs, right side, sequela: Secondary | ICD-10-CM

## 2013-08-01 DIAGNOSIS — I10 Essential (primary) hypertension: Secondary | ICD-10-CM

## 2013-08-01 DIAGNOSIS — E1142 Type 2 diabetes mellitus with diabetic polyneuropathy: Secondary | ICD-10-CM

## 2013-08-01 DIAGNOSIS — R5383 Other fatigue: Secondary | ICD-10-CM

## 2013-08-01 LAB — POCT GLYCOSYLATED HEMOGLOBIN (HGB A1C): Hemoglobin A1C: 7.1

## 2013-08-01 LAB — GLUCOSE, CAPILLARY: Glucose-Capillary: 133 mg/dL — ABNORMAL HIGH (ref 70–99)

## 2013-08-01 MED ORDER — LIDOCAINE 5 % EX PTCH: 1.0000 | MEDICATED_PATCH | Freq: Two times a day (BID) | CUTANEOUS | Status: DC

## 2013-08-01 MED ORDER — ONDANSETRON HCL 4 MG PO TABS: 4.0000 mg | ORAL_TABLET | Freq: Three times a day (TID) | ORAL | Status: DC | PRN

## 2013-08-01 NOTE — Patient Instructions (Addendum)
-  Take Zofran as needed for nausea.  -Use lidocaine patch as needed for your rib pain.  -Follow up with Dr. Dorise Hiss and with Dr. Sharl Ma (Endocrinology specialist).

## 2013-08-02 NOTE — Assessment & Plan Note (Signed)
Lab Results  Component Value Date   HGBA1C 7.1 08/01/2013   HGBA1C 8.4 05/04/2013   HGBA1C 8.5 03/06/2013     Assessment: Diabetes control:  Well controlled Progress toward A1C goal:   close to goal Comments:She is on Lantus 25 units qHS and metformin 500mg  BID.   Plan: Medications:  given her two episodes of reported hypoglycemia in the 80s and 60s in the morning, will reduce Lantus dose to 22 units at bedtime and will continue metformin. The patient verbalized understandind. She did not have her CBG meter during this visit but will bring it next time.  Home glucose monitoring: Frequency:   Timing:   Instruction/counseling given: reminded to bring blood glucose meter & log to each visit and reminded to bring medications to each visit Educational resources provided: brochure;handout Self management tools provided:   Other plans: Pt to follow up this month with Dr. Dorise Hiss, her PCP and next month with Dr. Sharl Ma in Endocrinology.

## 2013-08-02 NOTE — Assessment & Plan Note (Addendum)
She has follow up with Dr. Sharl Ma next month. Her parathyroid nuclear scan shows: subtle asymmetric delayed activity in the left paratracheal area which is not definitive but could reflect a small superior left  parathyroid adenoma. No surgery referral at this time.

## 2013-08-02 NOTE — Assessment & Plan Note (Signed)
BP Readings from Last 3 Encounters:  08/01/13 122/85  07/20/13 118/80  07/18/13 124/74    Lab Results  Component Value Date   NA 137 07/20/2013   K 4.1 07/20/2013   CREATININE 0.71 07/20/2013    Assessment: Blood pressure control:  at goal Progress toward BP goal:      at goal Comments: she is on Cozaar 50mg  daily.  Plan: Medications:  continue current medications Educational resources provided: brochure;handout;video Self management tools provided:   Other plans: She will follow up with her PCP this month.

## 2013-08-02 NOTE — Assessment & Plan Note (Addendum)
On physical exam she actually has 5/5 strength and nl reflexes. Her weakness could be secondary to her hypercalcemia, thought not clear. She has appt with Dr. Sharl Ma in Endocrinology for further eval of hypercalcemia and hyperparathyroidism.

## 2013-08-02 NOTE — Progress Notes (Signed)
  Subjective:    Patient ID: Susan Holmes, female    DOB: [birth year], 57 y.o.   MRN: [id number]  HPI Susan Holmes is a 57 year old woman with PMH of DM2, HTN, hypercalcemia, and hyperparathyroidism who comes in for evaluation of nausea and fatigue for the past week. She states that she has been having dry heaves almost daily and has decreased her intake per mouth. She is able to swallow, however, and has not vomited. For the past two months she has been feeling tired and weakness all over. Of note, she had nuclear medicine parathyroid scan on 07/19/13 which showed subtle asymmetric delayed activity in the left paratracheal area which is not definitive but could reflect a small superior left parathyroid adenoma.  She also complains of low blood sugar readings in the morning of 60s and 80s last week.     Review of Systems  Constitutional: Positive for appetite change and fatigue. Negative for fever, chills, diaphoresis and activity change.  HENT: Negative for neck pain and neck stiffness.        Dry mouth  Respiratory: Negative for cough, shortness of breath and wheezing.   Cardiovascular: Negative for chest pain, palpitations and leg swelling.  Gastrointestinal: Positive for nausea. Negative for vomiting.  Genitourinary: Negative for dysuria and frequency.  Musculoskeletal:       Right sided rib pain 2/2 rib fracture  Skin: Negative for color change, pallor, rash and wound.  Neurological: Positive for weakness. Negative for dizziness and light-headedness.  Hematological: Negative for adenopathy.  Psychiatric/Behavioral: Negative for behavioral problems and agitation.       Objective:   Physical Exam  Nursing note and vitals reviewed. Constitutional: She is oriented to person, place, and time. She appears well-developed and well-nourished. No distress.  HENT:  Head: Normocephalic and atraumatic.  Eyes: No scleral icterus.  Cardiovascular: Normal rate and regular rhythm.    Pulmonary/Chest: Effort normal and breath sounds normal. No respiratory distress. She has no wheezes. She has no rales.  Tenderness along the right side of her chest wall  Abdominal: Soft. Bowel sounds are normal. She exhibits no distension and no mass. There is no tenderness. There is no rebound and no guarding.  Musculoskeletal: She exhibits no edema and no tenderness.  Neurological: She is alert and oriented to person, place, and time.  5/5 strength in UE and LE.  Normal/slow patellar and biceps reflexes.   Skin: Skin is warm and dry. No rash noted. She is not diaphoretic. No erythema. No pallor.  Psychiatric: She has a normal mood and affect.          Assessment & Plan:

## 2013-08-02 NOTE — Assessment & Plan Note (Signed)
Her pain is improving, she requests refill for lidocaine patch and voltaren gel. She states that she no longer needs Vicodin.  Rx refill for lidocaine patch 5% q12h PRN.

## 2013-08-06 NOTE — Progress Notes (Signed)
Case discussed with Dr. Kennerly soon after the resident saw the patient.  We reviewed the resident's history and exam and pertinent patient test results.  I agree with the assessment, diagnosis, and plan of care documented in the resident's note. 

## 2013-08-08 ENCOUNTER — Telehealth: Payer: Self-pay | Admitting: *Deleted

## 2013-08-08 NOTE — Telephone Encounter (Signed)
Message copied by Hassan Buckler on Wed Aug 08, 2013 12:04 PM ------      Message from: Baltazar Apo      Created: Wed Aug 08, 2013  8:52 AM      Regarding: FW: PFT Order                   ----- Message -----         From: Donell Sievert         Sent: 08/08/2013   8:05 AM           To: Inez Catalina, MD, Darden Brenan [place], MD      Subject: PFT Order                                                PFT for Susan Holmes has been scheduled at [facility] for August 21st at 1pm with 12:45 arrival time.  Please instruct patient to enter hospital on [address] through main entrance A and to register with admitting.  If this appointment day/time does not work for patient, please call [phone] to reschedule.            Thank you.       ------

## 2013-08-08 NOTE — Telephone Encounter (Signed)
Pt  called and informed of appt Aug 21 for PFT's and to register at [address] entrance.

## 2013-08-16 ENCOUNTER — Ambulatory Visit (HOSPITAL_COMMUNITY)
Admission: RE | Admit: 2013-08-16 | Discharge: 2013-08-16 | Disposition: A | Discharge status: Home / Self Care | Payer: Medicare Other | Source: Ambulatory Visit | Attending: Internal Medicine | Admitting: Internal Medicine

## 2013-08-16 DIAGNOSIS — R0602 Shortness of breath: Secondary | ICD-10-CM

## 2013-08-17 ENCOUNTER — Encounter: Payer: Medicaid Other | Admitting: Internal Medicine

## 2013-08-17 ENCOUNTER — Ambulatory Visit (INDEPENDENT_AMBULATORY_CARE_PROVIDER_SITE_OTHER): Payer: Medicare Other | Admitting: Internal Medicine

## 2013-08-17 ENCOUNTER — Encounter: Payer: Self-pay | Admitting: Internal Medicine

## 2013-08-17 DIAGNOSIS — F17200 Nicotine dependence, unspecified, uncomplicated: Secondary | ICD-10-CM

## 2013-08-17 DIAGNOSIS — E213 Hyperparathyroidism, unspecified: Secondary | ICD-10-CM

## 2013-08-17 DIAGNOSIS — I10 Essential (primary) hypertension: Secondary | ICD-10-CM

## 2013-08-17 DIAGNOSIS — E1142 Type 2 diabetes mellitus with diabetic polyneuropathy: Secondary | ICD-10-CM

## 2013-08-17 DIAGNOSIS — E1149 Type 2 diabetes mellitus with other diabetic neurological complication: Secondary | ICD-10-CM

## 2013-08-17 DIAGNOSIS — J984 Other disorders of lung: Secondary | ICD-10-CM

## 2013-08-17 MED ORDER — ENSURE COMPLETE PO LIQD: 237.0000 mL | Freq: Two times a day (BID) | ORAL | Status: DC

## 2013-08-17 NOTE — Patient Instructions (Signed)
Go see Dr. Sharl Ma next month. Come back and see [place] in 3 months. We will send you for pulmonary rehab to help you improve.   For your ear or jaw pain try a mouth guard at night time. If this does not work you may need to see the ear doctor again. Their number to call is [phone]    We will also send you to the GI doctor to screen for colon polyps.  Call us with questions or problems at [phone].

## 2013-08-18 NOTE — Progress Notes (Signed)
Subjective:     Patient ID: Susan Holmes, female   DOB: 1956/10/06, 57 y.o.   MRN: [id number]  HPI  The patient is a 57 YO female who comes into the clinic today for a routine follow up visit. She is having more pain in her left ear and did see ENT but not recently. She is still having not much energy and has noticed that she feels sick to her stomach less often. Her biggest complaint is lack of energy and some shortness of breath. She is not having any hypoglycemic episodes or chills. She continues to smoke the same amount. She is not having abdominal pain. She is not having any chest pain. She goes to see the endocrinologist next month.     Review of Systems  Constitutional: Positive for fatigue. Negative for fever, chills, diaphoresis, activity change, appetite change and unexpected weight change.  Respiratory: Positive for shortness of breath. Negative for cough, chest tightness and wheezing.   Cardiovascular: Negative for chest pain, palpitations and leg swelling.  Gastrointestinal: Positive for nausea. Negative for vomiting, abdominal pain and diarrhea.  Musculoskeletal: Positive for back pain. Negative for myalgias.  Skin: Negative.   Neurological: Negative for dizziness, tremors, seizures, syncope, facial asymmetry, speech difficulty, weakness, light-headedness, numbness and headaches.  Psychiatric/Behavioral: Negative.        Objective:   Physical Exam  Constitutional: She is oriented to person, place, and time. She appears well-developed and well-nourished. No distress.  HENT:  Head: Normocephalic and atraumatic.  Right Ear: External ear normal.  Left Ear: External ear normal.  No signs of infection in left ear or right ear. Some wax visualized in the ear without obstruction in the left or right. The tmypanic membrane appears without purulent discharge and has some white ring around the lower half of the membrane.   Eyes: EOM are normal. Pupils are equal, round, and reactive to  light.  Neck: Normal range of motion. Neck supple.  Cardiovascular: Normal rate and regular rhythm.   Pulmonary/Chest: Effort normal and breath sounds normal. No respiratory distress. She has no wheezes. She has no rales. She exhibits no tenderness.  Abdominal: Soft. Bowel sounds are normal. She exhibits no distension and no mass. There is no tenderness. There is no rebound and no guarding.  Musculoskeletal: She exhibits no tenderness.  Neurological: She is alert and oriented to person, place, and time. No cranial nerve deficit.  Skin: Skin is warm and dry. She is not diaphoretic.       Assessment/Plan:   1. Please see problem oriented charting.  2. Disposition - Patient will see Dr. Sharl Ma next month and will be referred to GI for colonoscopy. She does not wish to quit smoking at this time. She was advised to go back to ENT if her ears continue to bother her as there are no signs of acute infection. She was however advised to see her dentist and to try a mouth guard for the ear pain.

## 2013-08-18 NOTE — Assessment & Plan Note (Signed)
Lab Results  Component Value Date   HGBA1C 7.1 08/01/2013   HGBA1C 8.4 05/04/2013   HGBA1C 8.5 03/06/2013     Assessment: Diabetes control: good control (HgbA1C at goal) Progress toward A1C goal:  at goal Comments:   Plan: Medications:  continue current medications, metformin 1000 mg bid and lantus 25 mg daily Home glucose monitoring: Frequency: 3 times a day Timing: before meals Instruction/counseling given: reminded to get eye exam, reminded to bring blood glucose meter & log to each visit, reminded to bring medications to each visit and discussed foot care Educational resources provided: brochure;handout Self management tools provided: copy of home glucose meter download Other plans:

## 2013-08-18 NOTE — Assessment & Plan Note (Signed)
The patient will be seen by Dr. Sharl Ma and may still need surgical intervention. Some of her fatigue and weakness symptoms could be related.

## 2013-08-18 NOTE — Assessment & Plan Note (Signed)
BP Readings from Last 3 Encounters:  08/17/13 117/77  08/01/13 122/85  07/20/13 118/80    Lab Results  Component Value Date   NA 137 07/20/2013   K 4.1 07/20/2013   CREATININE 0.71 07/20/2013    Assessment: Blood pressure control: controlled Progress toward BP goal:  at goal Comments:   Plan: Medications:  continue current medications losartan 50 mg daily Educational resources provided: brochure;handout;video Self management tools provided:   Other plans:

## 2013-08-18 NOTE — Assessment & Plan Note (Signed)
  Assessment: Progress toward smoking cessation:  smoking the same amount Barriers to progress toward smoking cessation:    Comments:   Plan: Instruction/counseling given:  I counseled patient on the dangers of tobacco use, advised patient to stop smoking, and reviewed strategies to maximize success. Educational resources provided:  [employer]) brochure Self management tools provided:    Medications to assist with smoking cessation:  None Patient agreed to the following self-care plans for smoking cessation:    Other plans:

## 2013-08-19 NOTE — Progress Notes (Signed)
Case discussed with Dr. Kollar immediately after the visit. We reviewed the resident's history and exam and pertinent patient test results. I agree with the assessment, diagnosis and plan of care documented in the resident's note. 

## 2013-08-23 NOTE — Progress Notes (Signed)
Pulmonary rehab referral given to [facility] to work on appt. Stanton Kidney Kacen Mellinger RN 08/23/13  2PM

## 2013-08-28 ENCOUNTER — Other Ambulatory Visit: Payer: Self-pay | Admitting: Internal Medicine

## 2013-08-30 NOTE — Addendum Note (Signed)
Addended by: Hassan Buckler on: 08/30/2013 10:16 AM   Modules accepted: Orders

## 2013-09-05 ENCOUNTER — Telehealth: Payer: Self-pay | Admitting: *Deleted

## 2013-09-05 NOTE — Telephone Encounter (Signed)
Pt called states ENT doctor states ears are ok thought it was the jaw and referred her back to the clinic. Pt only wants to see Dr Dorise Hiss - no open appt till her appt in 10/2013. If problem get worse - to call [facility]. Stanton Kidney Tramond Slinker RN 09/05/13 11AM

## 2013-09-11 ENCOUNTER — Telehealth: Payer: Self-pay | Admitting: *Deleted

## 2013-09-11 NOTE — Telephone Encounter (Signed)
Message from pt's daughter Shirlee Limerick - [phone] She called with concerns about why pt has not seen GI doctor. She states pt has not eaten any solid foods since June, she has lost about 25 pounds since June. She is not able to keep any food down.  I called [facility] to see if pt has been scheduled - Dr Dulce Sellar They will talk with Dr Dulce Sellar and call back.

## 2013-09-11 NOTE — Telephone Encounter (Signed)
Received call from [facility] GI and colonoscopy has been scheduled for Friday of this week. Pt is aware and they will also call daughter.

## 2013-09-14 ENCOUNTER — Other Ambulatory Visit: Payer: Self-pay | Admitting: Gastroenterology

## 2013-09-18 ENCOUNTER — Telehealth: Payer: Self-pay | Admitting: *Deleted

## 2013-09-18 NOTE — Telephone Encounter (Signed)
Pt would like for lela to call her about endocrinologist appt

## 2013-09-19 ENCOUNTER — Telehealth: Payer: Self-pay | Admitting: *Deleted

## 2013-09-19 NOTE — Addendum Note (Signed)
Addended by: Neomia Dear on: 09/19/2013 07:33 PM   Modules accepted: Orders

## 2013-09-19 NOTE — Telephone Encounter (Signed)
Pt calls and leaves message asking if she has an appt yet for her "androids", i take she is speaking of hemorrhoids, i called dr Hulen Shouts office and he did a colonoscopy on 9/19, report is not back yet, they will fax when it is available, also his assistant will call back when available, she has called back and report is not ready but pt does have internal hemorrhoids and had a polyp that was removed and was sent to pathology. After speaking to pt it is discovered she was speaking of endocrinologist, dr Sharl Ma, triage nurse called his office and discovered that pt missed 9/8 appt, it was rescheduled for 12/11 at 1500, called pt and she states she needs to be seen very soon because she is having continued wt loss and feeling very tired. She was given dr Daune Perch office ph# and she will call to see if they can help before dec.

## 2013-09-21 ENCOUNTER — Ambulatory Visit (INDEPENDENT_AMBULATORY_CARE_PROVIDER_SITE_OTHER): Payer: Medicare Other | Admitting: Internal Medicine

## 2013-09-21 ENCOUNTER — Encounter: Payer: Self-pay | Admitting: Internal Medicine

## 2013-09-21 VITALS — BP 128/90 | HR 95 | Ht 66.25 in | Wt 170.0 lb

## 2013-09-21 DIAGNOSIS — R634 Abnormal weight loss: Secondary | ICD-10-CM

## 2013-09-21 DIAGNOSIS — Z0000 Encounter for general adult medical examination without abnormal findings: Secondary | ICD-10-CM | POA: Insufficient documentation

## 2013-09-21 DIAGNOSIS — Z1239 Encounter for other screening for malignant neoplasm of breast: Secondary | ICD-10-CM

## 2013-09-21 DIAGNOSIS — F329 Major depressive disorder, single episode, unspecified: Secondary | ICD-10-CM

## 2013-09-21 DIAGNOSIS — F411 Generalized anxiety disorder: Secondary | ICD-10-CM

## 2013-09-21 LAB — T3, FREE: T3, Free: 2.7 pg/mL (ref 2.3–4.2)

## 2013-09-21 LAB — T4, FREE: Free T4: 0.94 ng/dL (ref 0.80–1.80)

## 2013-09-21 LAB — TSH: TSH: 0.702 u[IU]/mL (ref 0.350–4.500)

## 2013-09-21 MED ORDER — SERTRALINE HCL 100 MG PO TABS: 200.0000 mg | ORAL_TABLET | Freq: Every day | ORAL | Status: DC

## 2013-09-21 NOTE — Patient Instructions (Addendum)
1. Please increase your Zoloft dose to 200 mg daily from now. 2. Please see [facility] as soon as possible. 2. Please eat 3 meals day. 3. If you have worsening of your symptoms or new symptoms arise, please call the clinic ([phone]), or go to the ER immediately if symptoms are severe.

## 2013-09-21 NOTE — Assessment & Plan Note (Addendum)
Patient symptoms is most likely caused by worsening depression. Elevated calcium level may also have contributed. Although patient has history of hyperparathyroidism, her recent PTH was normal. She still has elevated serum calcium level.  Other potential differential diagnoses include, thyroid dysfunction, multiple myeloma (patient had elevated serum calcium level. Her recent normal PTH) and malignancy.   -Will increase her Zoloft dose to 200 mg daily.  -Patient was instructed to follow up with [facility] psychiatry clinic ASAP. Patient is provided with all the inflammation about [facility] clinic. She agreed to do so. -will check TSH and free T4/T3 -will get SPEP to rule out MM -will repeat mammogram - Patient missed one appointment with endocrinologist, Dr. Sharl Ma on 9/8. It was rescheduled for 12/11 at 1500.

## 2013-09-21 NOTE — Progress Notes (Addendum)
Patient ID: Susan Holmes, female   DOB: 1956-01-28, 57 y.o.   MRN: [id number] Subjective:   Patient ID: Susan Holmes female   DOB: 28-Jun-1956 57 y.o.   MRN: [id number]  CC:   Acute visit due to weight loss and poor appetite HPI:  [name] is a 57 y.o. lady with past medical history as outlined below, who present for an acute visit today.  Patient reports that she has been having poor appetite and cannot eat well.  It has been going on for about 3 months. She lost 10 pounds since 8/22 (180 on 8/22-->170 LBs today). She does not have nausea, vomiting or abdominal pain. She has mild constipation. She dose not have any difficulty in swallowing. She has a history of depression. She is currently taking amitriptyline 150 mg daily, sertraline 150 mg daily and trazodone 200 mg daily. She reports that she lost interesting in her daily activity. She does not have any energy. She feels tired all the time. Her appetite is very poor. He does not have suicidal or homicidal ideation. She does not have a fever or chills.  Patient had a colonoscopy on 09/14/13. She had one tubular adenoma polyp which was removed. Patient had a negative mammogram on 09/20/12. Of note, patient has hx of hyperparathyroidism, but her recent PTH was normal. Her serum calcium is elevated. Her PTH was 61.4 on 07/20/13 which is normal. Her TSH was 3.036 on 03/06/13 with normal free T4. Patient has a history of a hypertension and congestive heart failure which are well controlled. Today she does not have chest pain, shortness of breath, palpitation or leg edema. Her blood pressure is normal at 128/90 mmHg  ROS:  Denies fever, chills, headaches,  cough, chest pain, SOB,  abdominal pain, diarrhea, dysuria, urgency, frequency, hematuria.    Past Medical History  Diagnosis Date  . Hepatitis B infection pt unsure    resolved  . Diabetes mellitus, type II   . HTN (hypertension)   . Hyperlipidemia   . Congenital blindness     L eye   . Tobacco abuse     .5 PPD smoker  . Anxiety   . Depression   . Shortness of breath   . Arthritis    Current Outpatient Prescriptions  Medication Sig Dispense Refill  . ACCU-CHEK FASTCLIX LANCETS MISC 1 each by Does not apply route 3 (three) times daily.  102 each  11  . amitriptyline (ELAVIL) 75 MG tablet TAKE 1 TABLET BY MOUTH EVERY NIGHT AT BEDTIME  90 tablet  1  . ASPIRIN LOW DOSE 81 MG EC tablet TAKE 1 TABLET BY MOUTH DAILY  30 tablet  0  . atorvastatin (LIPITOR) 40 MG tablet Take 1 tablet (40 mg total) by mouth daily.  30 tablet  6  . diclofenac sodium (VOLTAREN) 1 % GEL Apply 4 g topically 4 (four) times daily.  1 Tube  2  . feeding supplement (ENSURE COMPLETE) LIQD Take 237 mLs by mouth 2 (two) times daily between meals.  60 Bottle  6  . insulin glargine (LANTUS) 100 UNIT/ML injection Inject 0.25 mLs (25 Units total) into the skin at bedtime.  10 mL  12  . Insulin Pen Needle 31G X 6 MM MISC 1 each by Does not apply route 4 (four) times daily.  100 each  11  . Ipratropium-Albuterol (COMBIVENT) 20-100 MCG/ACT AERS respimat Inhale 1 puff into the lungs every 6 (six) hours as needed for wheezing.  4 g  12  . lidocaine (LIDODERM) 5 % Place 1 patch onto the skin every 12 (twelve) hours. Remove & Discard patch within 12 hours or as directed by MD  10 patch  0  . losartan (COZAAR) 50 MG tablet TAKE 1 TABLET BY MOUTH EVERY DAY  30 tablet  0  . metFORMIN (GLUCOPHAGE) 500 MG tablet Take 1 tablet (500 mg total) by mouth 2 (two) times daily with a meal.  60 tablet  3  . Olopatadine HCl (PATADAY) 0.2 % SOLN Apply 1 drop to eye daily. (Right eye only)  1 Bottle  1  . ondansetron (ZOFRAN) 4 MG tablet Take 1 tablet (4 mg total) by mouth every 8 (eight) hours as needed for nausea.  20 tablet  0  . sertraline (ZOLOFT) 100 MG tablet Take 1.5 tablets (150 mg total) by mouth daily.  45 tablet  6  . traZODone (DESYREL) 100 MG tablet Take 200 mg by mouth at bedtime.       No current facility-administered  medications for this visit.   Family History  Problem Relation Age of Onset  . Heart disease Mother 50  . Hypertension Mother   . Heart disease Father 31  . Aneurysm Brother   . Aneurysm Sister   . Colon cancer Neg Hx   . Esophageal cancer Neg Hx   . Stomach cancer Neg Hx    History   Social History  . Marital Status: Divorced    Spouse Name: N/A    Number of Children: N/A  . Years of Education: N/A   Social History Main Topics  . Smoking status: Current Every Day Smoker -- 0.50 packs/day for 20 years    Types: Cigarettes  . Smokeless tobacco: Current User    Types: Chew     Comment: did not want to discuss any stopping or decreasing  . Alcohol Use: No  . Drug Use: No  . Sexual Activity: Not Currently    Birth Control/ Protection: Surgical   Other Topics Concern  . None   Social History Narrative  . None    Review of Systems: Full 14-point review of systems otherwise negative. See HPI.   Objective:  Physical Exam: Filed Vitals:   09/21/13 1350  BP: 128/90  Pulse: 95  Height: 5' 6.25" (1.683 m)  Weight: 170 lb (77.111 kg)  SpO2: 98%   Constitutional: Vital signs reviewed.  Patient looks tired HEENT:  Head: Normocephalic and atraumatic Mouth: no erythema or exudates, MMM Eyes: PERRL, EOMI, conjunctivae normal, No scleral icterus.  Neck: Supple, Trachea midline normal ROM, No JVD  Cardiovascular: RRR, S1 normal, S2 normal, no MRG, pulses symmetric and intact bilaterally Pulmonary/Chest: CTAB, no wheezes, rales, or rhonchi Abdominal: Soft. Non-tender, non-distended, bowel sounds are normal, no masses, organomegaly, or guarding present.  GU: no CVA tenderness Musculoskeletal: No joint deformities, erythema, or stiffness, ROM full and non-tender Extremities: No leg edema Hematology: no cervical, inginal, or axillary adenopathy.  Neurological: A&O x3, Strength is normal and symmetric bilaterally, cranial nerve II-XII are grossly intact, no focal motor  deficit, sensory intact to light touch bilaterally. Skin: Warm, dry and intact. No rash, cyanosis, or clubbing.  Psychiatric: flat mood. No suicidal or homicidal ideation. She is not psychotic.  Assessment & Plan:   Addendum 09/22/13  Patient missed one appointment with endocrinologist, Dr. Sharl Ma on 9/8. It was rescheduled for 12/06/13.  Lorretta Harp, MD PGY3, Internal Medicine Teaching Service Pager: [phone]

## 2013-09-21 NOTE — Assessment & Plan Note (Signed)
-  Patient refused a flu shot today. Will postpone. -Patient had a colonoscopy on 9/19 which showed 1 polyp. Biopsy showed tubular adenoma. -Will repeat mammogram.

## 2013-09-24 ENCOUNTER — Telehealth (HOSPITAL_COMMUNITY): Payer: Self-pay | Admitting: *Deleted

## 2013-09-24 ENCOUNTER — Encounter (HOSPITAL_COMMUNITY): Payer: Self-pay | Admitting: *Deleted

## 2013-09-24 ENCOUNTER — Emergency Department (HOSPITAL_COMMUNITY): Payer: Medicare Other

## 2013-09-24 ENCOUNTER — Emergency Department (HOSPITAL_COMMUNITY)
Admission: EM | Admit: 2013-09-24 | Discharge: 2013-09-24 | Disposition: A | Discharge status: Home / Self Care | Payer: Medicare Other | Attending: Emergency Medicine | Admitting: Emergency Medicine

## 2013-09-24 DIAGNOSIS — R634 Abnormal weight loss: Secondary | ICD-10-CM | POA: Insufficient documentation

## 2013-09-24 DIAGNOSIS — Z7982 Long term (current) use of aspirin: Secondary | ICD-10-CM | POA: Insufficient documentation

## 2013-09-24 DIAGNOSIS — E785 Hyperlipidemia, unspecified: Secondary | ICD-10-CM | POA: Insufficient documentation

## 2013-09-24 DIAGNOSIS — M129 Arthropathy, unspecified: Secondary | ICD-10-CM | POA: Insufficient documentation

## 2013-09-24 DIAGNOSIS — Z794 Long term (current) use of insulin: Secondary | ICD-10-CM | POA: Insufficient documentation

## 2013-09-24 DIAGNOSIS — E119 Type 2 diabetes mellitus without complications: Secondary | ICD-10-CM | POA: Insufficient documentation

## 2013-09-24 DIAGNOSIS — R1310 Dysphagia, unspecified: Secondary | ICD-10-CM | POA: Insufficient documentation

## 2013-09-24 DIAGNOSIS — F3289 Other specified depressive episodes: Secondary | ICD-10-CM | POA: Insufficient documentation

## 2013-09-24 DIAGNOSIS — Z79899 Other long term (current) drug therapy: Secondary | ICD-10-CM | POA: Insufficient documentation

## 2013-09-24 DIAGNOSIS — I10 Essential (primary) hypertension: Secondary | ICD-10-CM | POA: Insufficient documentation

## 2013-09-24 DIAGNOSIS — F329 Major depressive disorder, single episode, unspecified: Secondary | ICD-10-CM | POA: Insufficient documentation

## 2013-09-24 DIAGNOSIS — H5440 Blindness, one eye, unspecified eye: Secondary | ICD-10-CM | POA: Insufficient documentation

## 2013-09-24 DIAGNOSIS — F411 Generalized anxiety disorder: Secondary | ICD-10-CM | POA: Insufficient documentation

## 2013-09-24 DIAGNOSIS — F17200 Nicotine dependence, unspecified, uncomplicated: Secondary | ICD-10-CM | POA: Insufficient documentation

## 2013-09-24 LAB — LIPASE, BLOOD: Lipase: 10 U/L — ABNORMAL LOW (ref 11–59)

## 2013-09-24 LAB — COMPREHENSIVE METABOLIC PANEL
ALT: 24 U/L (ref 0–35)
AST: 36 U/L (ref 0–37)
Albumin: 3.7 g/dL (ref 3.5–5.2)
Alkaline Phosphatase: 98 U/L (ref 39–117)
BUN: 6 mg/dL (ref 6–23)
CO2: 24 mEq/L (ref 19–32)
Calcium: 10 mg/dL (ref 8.4–10.5)
Chloride: 101 mEq/L (ref 96–112)
Creatinine, Ser: 0.68 mg/dL (ref 0.50–1.10)
GFR calc Af Amer: >90 mL/min (ref >90)
GFR calc non Af Amer: >90 mL/min (ref >90)
Glucose, Bld: 153 mg/dL — ABNORMAL HIGH (ref 70–99)
Potassium: 4.7 mEq/L (ref 3.5–5.1)
Sodium: 137 mEq/L (ref 135–145)
Total Bilirubin: 0.3 mg/dL (ref 0.3–1.2)
Total Protein: 7.2 g/dL (ref 6.0–8.3)

## 2013-09-24 LAB — URINALYSIS, ROUTINE W REFLEX MICROSCOPIC
Bilirubin Urine: NEGATIVE
Glucose, UA: NEGATIVE mg/dL
Hgb urine dipstick: NEGATIVE
Ketones, ur: NEGATIVE mg/dL
Nitrite: NEGATIVE
Protein, ur: NEGATIVE mg/dL
Specific Gravity, Urine: 1.016 (ref 1.005–1.030)
Urobilinogen, UA: 0.2 mg/dL (ref 0.0–1.0)
pH: 6.5 (ref 5.0–8.0)

## 2013-09-24 LAB — CBC WITH DIFFERENTIAL/PLATELET
Basophils Absolute: 0 10*3/uL (ref 0.0–0.1)
Basophils Relative: 0 % (ref 0–1)
Eosinophils Absolute: 0.2 10*3/uL (ref 0.0–0.7)
Eosinophils Relative: 3 % (ref 0–5)
HCT: 38.4 % (ref 36.0–46.0)
Hemoglobin: 12.1 g/dL (ref 12.0–15.0)
Lymphocytes Relative: 47 % — ABNORMAL HIGH (ref 12–46)
Lymphs Abs: 2.8 10*3/uL (ref 0.7–4.0)
MCH: 21.8 pg — ABNORMAL LOW (ref 26.0–34.0)
MCHC: 31.5 g/dL (ref 30.0–36.0)
MCV: 69.1 fL — ABNORMAL LOW (ref 78.0–100.0)
Monocytes Absolute: 0.4 10*3/uL (ref 0.1–1.0)
Monocytes Relative: 7 % (ref 3–12)
Neutro Abs: 2.6 10*3/uL (ref 1.7–7.7)
Neutrophils Relative %: 43 % (ref 43–77)
Platelets: 313 10*3/uL (ref 150–400)
RBC: 5.56 MIL/uL — ABNORMAL HIGH (ref 3.87–5.11)
RDW: 16.7 % — ABNORMAL HIGH (ref 11.5–15.5)
WBC: 6 10*3/uL (ref 4.0–10.5)

## 2013-09-24 LAB — URINE MICROSCOPIC-ADD ON

## 2013-09-24 MED ORDER — SODIUM CHLORIDE 0.9 % IV SOLN
1000.0000 mL | Freq: Once | INTRAVENOUS | Status: AC
  Administered 2013-09-24: 1000 mL via INTRAVENOUS

## 2013-09-24 MED ORDER — SODIUM CHLORIDE 0.9 % IV SOLN
1000.0000 mL | INTRAVENOUS | Status: DC
  Administered 2013-09-24: 1000 mL via INTRAVENOUS

## 2013-09-24 NOTE — ED Provider Notes (Signed)
CSN: [id number]     Arrival date & time 09/24/13  1105 History   First MD Initiated Contact with Patient 09/24/13 1148     Chief Complaint  Patient presents with  . not been eating for 3 months     HPI Patient reports ongoing difficulty swallowing over the past 3 months.  She states she's had some weight loss.  She thinks she can swallow fluids without any difficulty but she believes anything of substance including solid foods will not go down.  When asked specifically she says that she does not feel like she can swallow them from her mouth down.  She does not feel like getting stuck in her chest but that is more the function of swallowing itself.  She has no prior history of stroke.  She denies weakness in her arms or in her legs.  She has no headache.  She reports his been going on for 3 months and she's been speaking with her primary care physician about this but does not have an answer yet.  Family is concerned about her weight loss and thus they present to the emergency department today.  The patient has no significant pain at this time.     Past Medical History  Diagnosis Date  . Hepatitis B infection pt unsure    resolved  . Diabetes mellitus, type II   . HTN (hypertension)   . Hyperlipidemia   . Congenital blindness     L eye  . Tobacco abuse     .5 PPD smoker  . Anxiety   . Depression   . Shortness of breath   . Arthritis    Past Surgical History  Procedure Laterality Date  . Abdominal hysterectomy     Family History  Problem Relation Age of Onset  . Heart disease Mother 44  . Hypertension Mother   . Heart disease Father 63  . Aneurysm Brother   . Aneurysm Sister   . Colon cancer Neg Hx   . Esophageal cancer Neg Hx   . Stomach cancer Neg Hx    History  Substance Use Topics  . Smoking status: Current Every Day Smoker -- 0.50 packs/day for 20 years    Types: Cigarettes  . Smokeless tobacco: Current User    Types: Chew     Comment: did not want to discuss any  stopping or decreasing  . Alcohol Use: No   OB History   Grav Para Term Preterm Abortions TAB SAB Ect Mult Living                 Review of Systems  Allergies  Ace inhibitors  Home Medications   Current Outpatient Rx  Name  Route  Sig  Dispense  Refill  . ACCU-CHEK FASTCLIX LANCETS MISC   Does not apply   1 each by Does not apply route 3 (three) times daily.   102 each   11   . amitriptyline (ELAVIL) 75 MG tablet   Oral   Take 75 mg by mouth daily.          Marland Kitchen aspirin 81 MG tablet   Oral   Take 81 mg by mouth daily.         Marland Kitchen atorvastatin (LIPITOR) 40 MG tablet   Oral   Take 1 tablet (40 mg total) by mouth daily.   30 tablet   6   . diclofenac sodium (VOLTAREN) 1 % GEL   Topical   Apply 4 g topically 4 (  four) times daily.   1 Tube   2   . insulin glargine (LANTUS) 100 UNIT/ML injection   Subcutaneous   Inject 0.25 mLs (25 Units total) into the skin at bedtime.   10 mL   12   . Insulin Pen Needle 31G X 6 MM MISC   Does not apply   1 each by Does not apply route 4 (four) times daily.   100 each   11   . losartan (COZAAR) 50 MG tablet   Oral   Take 50 mg by mouth daily.         . metFORMIN (GLUCOPHAGE) 500 MG tablet   Oral   Take 1 tablet (500 mg total) by mouth 2 (two) times daily with a meal.   60 tablet   3   . sertraline (ZOLOFT) 100 MG tablet   Oral   Take 2 tablets (200 mg total) by mouth daily.   60 tablet   3   . traZODone (DESYREL) 100 MG tablet   Oral   Take 200 mg by mouth at bedtime.          BP 128/73  Pulse 79  Temp(Src) 98.3 F (36.8 C) (Oral)  Resp 16  SpO2 100% Physical Exam  Nursing note and vitals reviewed. Constitutional: She is oriented to person, place, and time. She appears well-developed and well-nourished. No distress.  HENT:  Head: Normocephalic and atraumatic.  Eyes: EOM are normal.  Neck: Normal range of motion. Neck supple. No tracheal deviation present. No thyromegaly present.  Cardiovascular:  Normal rate, regular rhythm and normal heart sounds.   Pulmonary/Chest: Effort normal and breath sounds normal. No stridor.  Abdominal: Soft. She exhibits no distension. There is no tenderness.  Musculoskeletal: Normal range of motion.  Lymphadenopathy:    She has no cervical adenopathy.  Neurological: She is alert and oriented to person, place, and time.  Skin: Skin is warm and dry.  Psychiatric: She has a normal mood and affect. Judgment normal.    ED Course  Procedures (including critical care time) Labs Review Labs Reviewed  CBC WITH DIFFERENTIAL - Abnormal; Notable for the following:    RBC 5.56 (*)    MCV 69.1 (*)    MCH 21.8 (*)    RDW 16.7 (*)    Lymphocytes Relative 47 (*)    All other components within normal limits  COMPREHENSIVE METABOLIC PANEL - Abnormal; Notable for the following:    Glucose, Bld 153 (*)    All other components within normal limits  LIPASE, BLOOD - Abnormal; Notable for the following:    Lipase 10 (*)    All other components within normal limits  URINALYSIS, ROUTINE W REFLEX MICROSCOPIC - Abnormal; Notable for the following:    APPearance CLOUDY (*)    Leukocytes, UA MODERATE (*)    All other components within normal limits  URINE MICROSCOPIC-ADD ON - Abnormal; Notable for the following:    Squamous Epithelial / LPF FEW (*)    Bacteria, UA FEW (*)    All other components within normal limits  URINE CULTURE   Imaging Review Dg Esophagus  09/24/2013   *RADIOLOGY REPORT*  Clinical Data:Patient unable to swallow solid foods.  ESOPHAGUS/BARIUM SWALLOW/TABLET STUDY  Fluoroscopy Time: 1 minute 24 seconds.  Comparison: None.  Findings: Swallowing mechanism is unremarkable.  Primary esophageal peristalsis is sluggish.  No esophageal fold thickening.  There is slight narrowing at the gastroesophageal junction, through which a 13 mm barium pill would not  pass.  IMPRESSION:  1.  Slight narrowing at the gastroesophageal junction, through which a 13 mm barium  pill would not pass. 2.  Esophageal dysmotility.   Original Report Authenticated By: Leanna Battles, M.D.  I personally reviewed the imaging tests through PACS system I reviewed available ER/hospitalization records through the EMR   MDM   1. Dysphagia   2. Weight loss    Patient be referred to gastroenterology and also back to her primary care physician.  She has a scheduled outpatient followup with gastroenterology tomorrow ready.  I've asked that she make him aware that she had an esophagram performed today.  She may have a distal esophageal stricture which may require some additional evaluation including endoscopy.  She also would benefit from outpatient speech pathology video swallowing test.  This information has been sent on to her primary care physician that sent her personal message through the electronic medical record    Lyanne Co, MD 09/24/13 1557

## 2013-09-24 NOTE — ED Notes (Signed)
Bed: YQ65 Expected date:  Expected time:  Means of arrival:  Comments: Triage [place]ales

## 2013-09-24 NOTE — Progress Notes (Signed)
Case discussed with Dr. Niu at the time of the visit.  We reviewed the resident's history and exam and pertinent patient test results.  I agree with the assessment, diagnosis, and plan of care documented in the resident's note.    

## 2013-09-24 NOTE — ED Notes (Addendum)
Hx of Hep B and DM. Pt reports she has not been eating x3 months. Reports she has gone to pcp, they said it possibly was her thyroid. Pt came to ED today "bc she was tired of not knowing and wanted to find out what was wrong". Denies pain, nausea, or vomiting. Pt went to internal medicine on 9/26, pt has appointment on 11/7. Pt was told on 9/26 that she was very depressed. Pt sees someone at [facility]. Denies SI/HI.

## 2013-09-24 NOTE — Telephone Encounter (Signed)
Susan Holmes was contacted via telephone regarding referral to Pulmonary Rehab.  She has declined due to inability to pay.  Program is not covered by medicaid.  She does not qualify for financial assistance through hospital.  Cathie Olden RN

## 2013-09-25 LAB — SPEP & IFE WITH QIG
Albumin ELP: 56.6 % (ref 55.8–66.1)
Alpha-1-Globulin: 5.4 % — ABNORMAL HIGH (ref 2.9–4.9)
Alpha-2-Globulin: 13.5 % — ABNORMAL HIGH (ref 7.1–11.8)
Beta 2: 6.9 % — ABNORMAL HIGH (ref 3.2–6.5)
Beta Globulin: 6.5 % (ref 4.7–7.2)
Gamma Globulin: 11.1 % (ref 11.1–18.8)
IgA: 224 mg/dL (ref 69–380)
IgG (Immunoglobin G), Serum: 802 mg/dL (ref 690–1700)
IgM, Serum: 62 mg/dL (ref 52–322)
Total Protein, Serum Electrophoresis: 7.2 g/dL (ref 6.0–8.3)

## 2013-09-26 ENCOUNTER — Other Ambulatory Visit: Payer: Self-pay | Admitting: Internal Medicine

## 2013-09-26 LAB — URINE CULTURE: Colony Count: 15000

## 2013-09-27 ENCOUNTER — Other Ambulatory Visit: Payer: Self-pay | Admitting: Internal Medicine

## 2013-09-27 NOTE — Progress Notes (Signed)
Post ED Visit - Positive Culture Follow-up  Culture report reviewed by antimicrobial stewardship pharmacist: []  Wes Dulaney, Pharm.D., BCPS []  Celedonio Miyamoto, [address].D., BCPS []  Georgina Pillion, Pharm.D., BCPS []  [place], [address].D., BCPS, AAHIVP [x]  Estella Husk, Pharm.D., BCPS, AAHIVP  Positive Urine culture Colony count is insufficient and no further patient follow-up is required at this time.  Sallee Provencal 09/27/2013, 11:36 AM

## 2013-09-27 NOTE — Telephone Encounter (Signed)
Patient is not on elavil 150 anymore and refill was declined.

## 2013-10-09 ENCOUNTER — Encounter (HOSPITAL_COMMUNITY): Payer: Self-pay | Admitting: Emergency Medicine

## 2013-10-09 ENCOUNTER — Emergency Department (HOSPITAL_COMMUNITY)
Admission: EM | Admit: 2013-10-09 | Discharge: 2013-10-09 | Discharge status: Against Medical Advice | Payer: Medicare Other | Attending: Emergency Medicine | Admitting: Emergency Medicine

## 2013-10-09 DIAGNOSIS — R634 Abnormal weight loss: Secondary | ICD-10-CM | POA: Insufficient documentation

## 2013-10-09 DIAGNOSIS — F3289 Other specified depressive episodes: Secondary | ICD-10-CM | POA: Insufficient documentation

## 2013-10-09 DIAGNOSIS — I10 Essential (primary) hypertension: Secondary | ICD-10-CM | POA: Insufficient documentation

## 2013-10-09 DIAGNOSIS — R197 Diarrhea, unspecified: Secondary | ICD-10-CM | POA: Insufficient documentation

## 2013-10-09 DIAGNOSIS — F411 Generalized anxiety disorder: Secondary | ICD-10-CM | POA: Insufficient documentation

## 2013-10-09 DIAGNOSIS — M129 Arthropathy, unspecified: Secondary | ICD-10-CM | POA: Insufficient documentation

## 2013-10-09 DIAGNOSIS — K649 Unspecified hemorrhoids: Secondary | ICD-10-CM | POA: Insufficient documentation

## 2013-10-09 DIAGNOSIS — F329 Major depressive disorder, single episode, unspecified: Secondary | ICD-10-CM | POA: Insufficient documentation

## 2013-10-09 DIAGNOSIS — H543 Unqualified visual loss, both eyes: Secondary | ICD-10-CM | POA: Insufficient documentation

## 2013-10-09 DIAGNOSIS — Z79899 Other long term (current) drug therapy: Secondary | ICD-10-CM | POA: Insufficient documentation

## 2013-10-09 DIAGNOSIS — E119 Type 2 diabetes mellitus without complications: Secondary | ICD-10-CM | POA: Insufficient documentation

## 2013-10-09 DIAGNOSIS — E785 Hyperlipidemia, unspecified: Secondary | ICD-10-CM | POA: Insufficient documentation

## 2013-10-09 DIAGNOSIS — Z794 Long term (current) use of insulin: Secondary | ICD-10-CM | POA: Insufficient documentation

## 2013-10-09 DIAGNOSIS — Z7982 Long term (current) use of aspirin: Secondary | ICD-10-CM | POA: Insufficient documentation

## 2013-10-09 DIAGNOSIS — R1310 Dysphagia, unspecified: Secondary | ICD-10-CM | POA: Insufficient documentation

## 2013-10-09 DIAGNOSIS — F17200 Nicotine dependence, unspecified, uncomplicated: Secondary | ICD-10-CM | POA: Insufficient documentation

## 2013-10-09 DIAGNOSIS — B1910 Unspecified viral hepatitis B without hepatic coma: Secondary | ICD-10-CM | POA: Insufficient documentation

## 2013-10-09 DIAGNOSIS — K921 Melena: Secondary | ICD-10-CM | POA: Insufficient documentation

## 2013-10-09 NOTE — ED Notes (Signed)
Pt walked out AMA, states "I'm leaving their not doing anything for me", pt did not stop to get sign, pulled IV out herself.

## 2013-10-09 NOTE — ED Notes (Signed)
Pt pulled out IV herself, IV found on bed, catheter intact.

## 2013-10-09 NOTE — ED Provider Notes (Signed)
CSN: [id number]     Arrival date & time 10/09/13  1316 History   First MD Initiated Contact with Patient 10/09/13 1535     Chief Complaint  Patient presents with  . poor appetite    (Consider location/radiation/quality/duration/timing/severity/associated sxs/prior Treatment) HPI Comments: 57 year old female who presents with decreased appetite for 3 months and reported 20 pound weight loss. She denies any pain. She states that she has had mild diarrhea for several days and mildly inflamed hemorrhoids with occasional dark blood for 2 days. The duration of her symptoms is constant. She states that when she chews meat she feels like she cannot swallow intermittent from her mouth. She is able to tolerate soft fruit. The severity is noted to be mild. She has not had similar symptoms in the past.  The history is provided by the patient.    Past Medical History  Diagnosis Date  . Hepatitis B infection pt unsure    resolved  . Diabetes mellitus, type II   . HTN (hypertension)   . Hyperlipidemia   . Congenital blindness     L eye  . Tobacco abuse     .5 PPD smoker  . Anxiety   . Depression   . Shortness of breath   . Arthritis    Past Surgical History  Procedure Laterality Date  . Abdominal hysterectomy     Family History  Problem Relation Age of Onset  . Heart disease Mother 30  . Hypertension Mother   . Heart disease Father 31  . Aneurysm Brother   . Aneurysm Sister   . Colon cancer Neg Hx   . Esophageal cancer Neg Hx   . Stomach cancer Neg Hx    History  Substance Use Topics  . Smoking status: Current Every Day Smoker -- 0.50 packs/day for 20 years    Types: Cigarettes  . Smokeless tobacco: Current User    Types: Chew     Comment: did not want to discuss any stopping or decreasing  . Alcohol Use: No   OB History   Grav Para Term Preterm Abortions TAB SAB Ect Mult Living                 Review of Systems  Constitutional: Negative for fever and fatigue.  HENT:  Negative for congestion and drooling.   Eyes: Negative for pain.  Respiratory: Negative for cough and shortness of breath.   Cardiovascular: Negative for chest pain.  Gastrointestinal: Positive for diarrhea. Negative for nausea, vomiting and abdominal pain.  Genitourinary: Negative for dysuria and hematuria.       Inflamed hemorrhoids for 2 days, occasional dark blood seen in stool.   Musculoskeletal: Negative for back pain, gait problem and neck pain.  Skin: Negative for color change.  Neurological: Negative for dizziness and headaches.  Hematological: Negative for adenopathy.  Psychiatric/Behavioral: Negative for behavioral problems.  All other systems reviewed and are negative.    Allergies  Ace inhibitors  Home Medications   Current Outpatient Rx  Name  Route  Sig  Dispense  Refill  . amitriptyline (ELAVIL) 75 MG tablet   Oral   Take 75 mg by mouth daily.          Marland Kitchen aspirin 81 MG tablet   Oral   Take 81 mg by mouth daily.         Marland Kitchen atorvastatin (LIPITOR) 40 MG tablet   Oral   Take 1 tablet (40 mg total) by mouth daily.   30 tablet  6   . diclofenac sodium (VOLTAREN) 1 % GEL   Topical   Apply 4 g topically 4 (four) times daily.   1 Tube   2   . insulin glargine (LANTUS) 100 UNIT/ML injection   Subcutaneous   Inject 0.25 mLs (25 Units total) into the skin at bedtime.   10 mL   12   . losartan (COZAAR) 50 MG tablet   Oral   Take 50 mg by mouth daily.         . metFORMIN (GLUCOPHAGE) 500 MG tablet   Oral   Take 1 tablet (500 mg total) by mouth 2 (two) times daily with a meal.   60 tablet   3   . sertraline (ZOLOFT) 100 MG tablet   Oral   Take 2 tablets (200 mg total) by mouth daily.   60 tablet   3   . traZODone (DESYREL) 100 MG tablet   Oral   Take 200 mg by mouth at bedtime.          BP 130/89  Pulse 88  Temp(Src) 98 F (36.7 C) (Oral)  Resp 16  SpO2 100% Physical Exam  Nursing note and vitals reviewed. Constitutional: She is  oriented to person, place, and time. She appears well-developed and well-nourished.  HENT:  Head: Normocephalic.  Mouth/Throat: Oropharynx is clear and moist. No oropharyngeal exudate.  Eyes: Conjunctivae and EOM are normal. Pupils are equal, round, and reactive to light.  Neck: Normal range of motion. Neck supple.  Cardiovascular: Normal rate, regular rhythm, normal heart sounds and intact distal pulses.  Exam reveals no gallop and no friction rub.   No murmur heard. Pulmonary/Chest: Effort normal and breath sounds normal. No respiratory distress. She has no wheezes.  Abdominal: Soft. Bowel sounds are normal. There is no tenderness. There is no rebound and no guarding.  Musculoskeletal: Normal range of motion. She exhibits no edema and no tenderness.  Neurological: She is alert and oriented to person, place, and time.  Skin: Skin is warm and dry.  Psychiatric: She has a normal mood and affect. Her behavior is normal.    ED Course  Procedures (including critical care time) Labs Review Labs Reviewed - No data to display Imaging Review No results found.  EKG Interpretation   None       MDM   1. Difficulty in swallowing    3:57 PM 57 y.o. female who presents with lack of appetite for approximately 3 months and reported 20 pound weight loss. The patient was previously seen here about 2 weeks ago. Since then she is followed up with [facility] and had a barium swallow study. She states she had dilation of her esophagus without relief and presents for continued symptoms. She states that she is able to eat soft fruit and drink liquids. She's not able to tolerate meats. She is afebrile and vital signs are unremarkable here. She notes that she has had mild diarrhea for several days and mildly inflamed hemorrhoids but does not want evaluated for that at this time. Will discuss the case with GI.  Barium Swallow showed 1. Slight narrowing at the gastroesophageal junction, through  which a 13 mm  barium pill would not pass.  2. Esophageal dysmotility.   4:41 PM I discussed the case with Dr. Dulce Sellar (her gi doc) who recommended that the patient call the office and get a sooner appointment. Upon returning to the room to discuss this with the patient it is noted that she  had left. The nurse states that the patient informed her that, "we were not doing anything for her so she was going to leave."  Junius Argyle, MD 10/10/13 1251

## 2013-10-09 NOTE — ED Notes (Signed)
EDP made aware pt left AMA

## 2013-10-09 NOTE — ED Notes (Signed)
Pt states the past 3 months she hasn't had an appetite, has lost 20 lbs, denies n/v/d, states "I put the food in my mouth and start chewing but I can't swallow it", when asked pt she denies difficulty swallowing food or fluid, states she drinks plenty of fluid it's just when she tries to eat food, pt states she's has a colonoscopy and endoscopy and nothing was found, pt states she has been feeling weak and dizzy w/ abdominal cramping since Saturday.

## 2013-10-09 NOTE — ED Notes (Signed)
Per pt, has had no appetite for 3 months-has lost 12 lbs

## 2013-10-10 DIAGNOSIS — R1310 Dysphagia, unspecified: Secondary | ICD-10-CM | POA: Diagnosis present

## 2013-10-12 ENCOUNTER — Ambulatory Visit: Payer: Medicare Other | Admitting: Internal Medicine

## 2013-10-27 ENCOUNTER — Other Ambulatory Visit: Payer: Self-pay | Admitting: Internal Medicine

## 2013-10-29 ENCOUNTER — Telehealth: Payer: Self-pay | Admitting: Licensed Clinical Social Worker

## 2013-10-29 ENCOUNTER — Other Ambulatory Visit: Payer: Self-pay | Admitting: Gastroenterology

## 2013-10-29 DIAGNOSIS — R634 Abnormal weight loss: Secondary | ICD-10-CM

## 2013-10-29 NOTE — Telephone Encounter (Signed)
CSW received call from Ms. Ironside.  Pt voiced concern that [facility] evicted her brother from her apartment.  Ms. Linville states brother is needed as he was her primary caregiver.  CSW inquired if [facility] requested anything in writing or if exceptions were made.  Pt unaware but is requesting letter from PCP indicating the services brother provided ie: prepared meals, provided transportation to medical appt's and grocery store, provided hands on care when pt is ill and assisted pt up/down stairs.  CSW informed Ms. Heng of availability of PCS, pt states she prefers services come from family.  CSW encouraged Ms. Ayala to discuss this concern with PCP during 11/02/13 appt and CSW will notify PCP.

## 2013-10-29 NOTE — Telephone Encounter (Signed)
I was not aware that her brother was providing care for her. We can discuss this at her visit on 11/02/13 and draft a letter based on that information.  Thanks,  Dr. Dorise Hiss

## 2013-10-31 ENCOUNTER — Ambulatory Visit
Admission: RE | Admit: 2013-10-31 | Discharge: 2013-10-31 | Disposition: A | Discharge status: Home / Self Care | Payer: Medicare Other | Source: Ambulatory Visit | Attending: Gastroenterology | Admitting: Gastroenterology

## 2013-10-31 DIAGNOSIS — R634 Abnormal weight loss: Secondary | ICD-10-CM

## 2013-10-31 MED ORDER — IOHEXOL 300 MG/ML  SOLN
100.0000 mL | Freq: Once | INTRAMUSCULAR | Status: AC | PRN
  Administered 2013-10-31: 100 mL via INTRAVENOUS

## 2013-11-01 ENCOUNTER — Other Ambulatory Visit: Payer: Self-pay | Admitting: Internal Medicine

## 2013-11-02 ENCOUNTER — Encounter: Payer: Self-pay | Admitting: Internal Medicine

## 2013-11-02 ENCOUNTER — Ambulatory Visit (INDEPENDENT_AMBULATORY_CARE_PROVIDER_SITE_OTHER): Payer: Medicare Other | Admitting: Internal Medicine

## 2013-11-02 VITALS — BP 137/89 | HR 68 | Temp 97.0°F | Ht 67.0 in | Wt 166.5 lb

## 2013-11-02 DIAGNOSIS — R634 Abnormal weight loss: Secondary | ICD-10-CM

## 2013-11-02 DIAGNOSIS — E213 Hyperparathyroidism, unspecified: Secondary | ICD-10-CM

## 2013-11-02 DIAGNOSIS — E1142 Type 2 diabetes mellitus with diabetic polyneuropathy: Secondary | ICD-10-CM

## 2013-11-02 DIAGNOSIS — E559 Vitamin D deficiency, unspecified: Secondary | ICD-10-CM

## 2013-11-02 DIAGNOSIS — E1149 Type 2 diabetes mellitus with other diabetic neurological complication: Secondary | ICD-10-CM

## 2013-11-02 DIAGNOSIS — N2889 Other specified disorders of kidney and ureter: Secondary | ICD-10-CM

## 2013-11-02 DIAGNOSIS — R109 Unspecified abdominal pain: Secondary | ICD-10-CM

## 2013-11-02 DIAGNOSIS — E119 Type 2 diabetes mellitus without complications: Secondary | ICD-10-CM

## 2013-11-02 DIAGNOSIS — N289 Disorder of kidney and ureter, unspecified: Secondary | ICD-10-CM

## 2013-11-02 LAB — BASIC METABOLIC PANEL WITH GFR
BUN: 6 mg/dL (ref 6–23)
CO2: 25 mEq/L (ref 19–32)
Calcium: 9.6 mg/dL (ref 8.4–10.5)
Chloride: 108 mEq/L (ref 96–112)
Creat: 0.62 mg/dL (ref 0.50–1.10)
GFR, Est African American: >89 mL/min
GFR, Est Non African American: >89 mL/min
Glucose, Bld: 76 mg/dL (ref 70–99)
Potassium: 4.2 mEq/L (ref 3.5–5.3)
Sodium: 140 mEq/L (ref 135–145)

## 2013-11-02 LAB — POCT GLYCOSYLATED HEMOGLOBIN (HGB A1C): Hemoglobin A1C: 5.6

## 2013-11-02 LAB — GLUCOSE, CAPILLARY: Glucose-Capillary: 99 mg/dL (ref 70–99)

## 2013-11-02 NOTE — Patient Instructions (Signed)
We will take some blood today and we will get the imaging study of your kidney. We would like you to follow with the endocrinologist to try to figure out your weight loss and fatigue.   Come back in 3-6 months.   Decrease your insulin to 15 units at night time.   Call with any questions or problems at [phone].

## 2013-11-03 LAB — VITAMIN D 25 HYDROXY (VIT D DEFICIENCY, FRACTURES): Vit D, 25-Hydroxy: 22 ng/mL — ABNORMAL LOW (ref 30–89)

## 2013-11-04 NOTE — Progress Notes (Signed)
Subjective:     Patient ID: Susan Holmes, female   DOB: Apr 13, 1956, 57 y.o.   MRN: [id number]  HPI The patient is a 57 YO female who comes into the clinic today for a routine follow up visit. She is having more pain in her left ear and did see ENT but not recently. She is still having not much energy and has noticed that she feels sick to her stomach less often. Her biggest complaint is lack of energy and some shortness of breath. She is very concerned about her weight loss and being unable to eat meat. She states that it never gets past her mouth although she denied it was getting stuck. It is unclear what the issue is with swallowing although she has been able to tolerate liquids and fruits. She is not having any hypoglycemic episodes or chills. She continues to smoke the same amount. She is not having abdominal pain. She is not having any chest pain. She goes to see the endocrinologist next month. She had her esophagus dilated in September but this did not help at all. She was referred back to [facility] for poor mood and she states that they changed her medicine and added one but she is unsure of the name and just started taking it not long ago. She denies SI/HI. She is chewing gum while we discuss her TMJ.  Review of Systems  Constitutional: Positive for fatigue. Negative for fever, chills, diaphoresis, activity change, appetite change and unexpected weight change.  Respiratory: Positive for shortness of breath. Negative for cough, chest tightness and wheezing.   Cardiovascular: Negative for chest pain, palpitations and leg swelling.  Gastrointestinal: Positive for nausea. Negative for vomiting, abdominal pain and diarrhea.  Musculoskeletal: Positive for back pain. Negative for myalgias.  Skin: Negative.   Neurological: Positive for weakness. Negative for dizziness, tremors, seizures, syncope, facial asymmetry, speech difficulty, light-headedness, numbness and headaches.  Psychiatric/Behavioral:  Positive for sleep disturbance, dysphoric mood and decreased concentration.       Objective:   Physical Exam  Constitutional: She is oriented to person, place, and time. She appears well-developed and well-nourished. No distress.  HENT:  Head: Normocephalic and atraumatic.  Eyes: EOM are normal. Pupils are equal, round, and reactive to light.  Neck: Normal range of motion. Neck supple.  Cardiovascular: Normal rate and regular rhythm.   No murmur heard. Pulmonary/Chest: Effort normal and breath sounds normal. No respiratory distress. She has no wheezes. She has no rales. She exhibits no tenderness.  Abdominal: Soft. Bowel sounds are normal. She exhibits no distension and no mass. There is no tenderness. There is no rebound and no guarding.  Musculoskeletal: She exhibits no tenderness.  Walks normally into and out of the clinic.   Neurological: She is alert and oriented to person, place, and time. No cranial nerve deficit.  Skin: Skin is warm and dry. She is not diaphoretic.       Assessment/Plan:   1. Please see problem oriented charting.  2. Disposition - Patient will see Dr. Sharl Ma next month. Colonoscopy and EGD normal. CT chest and abdomen without acute findings except 1 cm lesion on L kidney which needs evaluation with MR. Will order MR abdomen with and without contrast since we are still looking for a cause for her weight loss and hypercalcemia. Will recheck PTHrelated peptide and Vit D 1,25 and Vit D 25. Have reinforced the need to keep the next endocrine appointment with Dr. Sharl Ma. She agrees with this. Cut insulin back  to 15 units at night given her HgA1c of 5.6 and may need to stop insulin at next visit if she continues to lose weight at this rate. She was advised that her ear pain is likely TMJ and chewing gum can exacerbate it.  She was also advised to see her dentist and to try a mouth guard for the ear pain.  3. Additional note - The patient states that she relies on her brother  for some of her ADLs given her weakness and would like a note to help to let him live with her (he was kicked out of her place). Will talk with social worker on Monday to draft a letter which we will give to the patient and she can send it to the appropriate personnel.

## 2013-11-04 NOTE — Assessment & Plan Note (Signed)
Getting MRI abdomen to evaluate L kidney lesion 1 cm indeterminate on recent CT abdomen/pelvis. She has had colonscopy and EGD and CT chest and CT abdomen and pelvis. She has had hypercalcemia in the past several times and off thiazides. Looking back she is around the same as she was 1-2 years ago but she has had a steep decline in weight by about 45 pounds since March of this year. Concern remains for IPF given her restrictive lung disease on PFTs and could consider pulmonology referral for consideration of high resolution CT chest. No pulmonary nodules although she is a smoker. She has had decreased exercise tolerate for >1 year. Concern also remains for untreated mood disorder however [facility] is adjusting her medications currently and will continue to advise the patient to follow closely with them.

## 2013-11-04 NOTE — Assessment & Plan Note (Signed)
Will repeat Vit D 1,25 and Vit D 25 today. Will see endocrinology in December and stressed importance to the patient. DEXA without acute findings that would require surgical intervention although it still remains unclear to me if her symptoms are from her calcium levels.

## 2013-11-05 ENCOUNTER — Other Ambulatory Visit: Payer: Self-pay | Admitting: Gastroenterology

## 2013-11-05 DIAGNOSIS — K769 Liver disease, unspecified: Secondary | ICD-10-CM

## 2013-11-05 DIAGNOSIS — N289 Disorder of kidney and ureter, unspecified: Secondary | ICD-10-CM

## 2013-11-06 ENCOUNTER — Telehealth: Payer: Self-pay | Admitting: Licensed Clinical Social Worker

## 2013-11-06 NOTE — Telephone Encounter (Signed)
Discussion with PCP regarding pt's need for brother to stay 24/7 and provide live in care.  PCP unable to find medical need at this time based on pt's most recent [facility] appt.  Referral to outpatient PT can be provided to assess for needs.  CSW attempted to call pt to inform and offer referral.  CSW unable to leave a message at the number on chart.

## 2013-11-07 LAB — PTH-RELATED PEPTIDE: PTH-Related Protein (PTH-RP): 11 pg/mL — ABNORMAL LOW (ref 14–27)

## 2013-11-08 NOTE — Telephone Encounter (Signed)
CSW placed call to Ms. Haire to discuss referral to Outpt PT to assess needs.  Pt declines at this time "just don't worry about it".  Pt aware referral can be requested and CSW is available to assist as needed.

## 2013-11-09 ENCOUNTER — Other Ambulatory Visit: Payer: Self-pay | Admitting: Internal Medicine

## 2013-11-09 DIAGNOSIS — R2681 Unsteadiness on feet: Secondary | ICD-10-CM

## 2013-11-09 LAB — VITAMIN D 1,25 DIHYDROXY
Vitamin D 1, 25 (OH)2 Total: 32 pg/mL (ref 18–72)
Vitamin D2 1, 25 (OH)2: <8 pg/mL
Vitamin D3 1, 25 (OH)2: 32 pg/mL

## 2013-11-11 ENCOUNTER — Ambulatory Visit
Admission: RE | Admit: 2013-11-11 | Discharge: 2013-11-11 | Disposition: A | Discharge status: Home / Self Care | Payer: Medicare Other | Source: Ambulatory Visit | Attending: Gastroenterology | Admitting: Gastroenterology

## 2013-11-11 DIAGNOSIS — N289 Disorder of kidney and ureter, unspecified: Secondary | ICD-10-CM

## 2013-11-11 DIAGNOSIS — K769 Liver disease, unspecified: Secondary | ICD-10-CM

## 2013-11-11 MED ORDER — GADOBENATE DIMEGLUMINE 529 MG/ML IV SOLN
15.0000 mL | Freq: Once | INTRAVENOUS | Status: AC | PRN
  Administered 2013-11-11: 15 mL via INTRAVENOUS

## 2013-11-20 NOTE — Progress Notes (Signed)
Case discussed with Dr. Kollar soon after the resident saw the patient.  We reviewed the resident's history and exam and pertinent patient test results.  I agree with the assessment, diagnosis, and plan of care documented in the resident's note. 

## 2013-11-21 ENCOUNTER — Encounter: Payer: Self-pay | Admitting: Internal Medicine

## 2013-11-21 ENCOUNTER — Ambulatory Visit (INDEPENDENT_AMBULATORY_CARE_PROVIDER_SITE_OTHER): Payer: Medicare Other | Admitting: Internal Medicine

## 2013-11-21 ENCOUNTER — Other Ambulatory Visit: Payer: Self-pay | Admitting: Dietician

## 2013-11-21 VITALS — BP 134/88 | HR 72 | Temp 96.8°F | Resp 20 | Ht 66.0 in | Wt 167.5 lb

## 2013-11-21 DIAGNOSIS — E440 Moderate protein-calorie malnutrition: Secondary | ICD-10-CM

## 2013-11-21 DIAGNOSIS — I10 Essential (primary) hypertension: Secondary | ICD-10-CM

## 2013-11-21 DIAGNOSIS — M79609 Pain in unspecified limb: Secondary | ICD-10-CM

## 2013-11-21 DIAGNOSIS — E1149 Type 2 diabetes mellitus with other diabetic neurological complication: Secondary | ICD-10-CM

## 2013-11-21 LAB — GLUCOSE, CAPILLARY: Glucose-Capillary: 213 mg/dL — ABNORMAL HIGH (ref 70–99)

## 2013-11-21 MED ORDER — MELOXICAM 7.5 MG PO TABS: 7.5000 mg | ORAL_TABLET | Freq: Every day | ORAL | Status: DC

## 2013-11-21 MED ORDER — ACCU-CHEK FASTCLIX LANCETS MISC: Status: DC

## 2013-11-21 MED ORDER — GLUCOSE BLOOD VI STRP: ORAL_STRIP | Status: DC

## 2013-11-21 MED ORDER — ACCU-CHEK NANO SMARTVIEW W/DEVICE KIT: 1.0000 | PACK | Freq: Two times a day (BID) | Status: DC

## 2013-11-21 NOTE — Patient Instructions (Signed)
We will check your B12 level today which can affect energy. We will send in a multivitamin for you. Keep your appointment on Monday with the physical therapy. Be sure to go to your visit with Dr. Sharl Ma about the calcium levels and your fatigue and tiredness.   We will send in a medicine you can take once per day for pain in your knee as needed.   Call us with questions or problems at [phone].

## 2013-11-21 NOTE — Progress Notes (Signed)
Subjective:     Patient ID: Susan Holmes, female   DOB: 01-08-1956, 57 y.o.   MRN: [id number]  HPI The patient is a 57 YO female who comes into the clinic today for leg pain. She has visit with neuro-rehab to evaluate her physical function on Monday. She is still having not much energy and wants something for that. She has pain in her knee and has tried tylenol without relief.  She is very concerned about her weight loss and reassured that her scan was normal. She is not having any hypoglycemic episodes or chills. She is not having abdominal pain. She is not having any chest pain. She goes to see the endocrinologist next month. She denies SI/HI.  Review of Systems  Constitutional: Positive for fatigue. Negative for fever, chills, diaphoresis, activity change, appetite change and unexpected weight change.  Respiratory: Positive for shortness of breath. Negative for cough, chest tightness and wheezing.   Cardiovascular: Negative for chest pain, palpitations and leg swelling.  Gastrointestinal: Positive for nausea. Negative for vomiting, abdominal pain and diarrhea.  Musculoskeletal: Positive for back pain. Negative for myalgias.  Skin: Negative.   Neurological: Positive for weakness. Negative for dizziness, tremors, seizures, syncope, facial asymmetry, speech difficulty, light-headedness, numbness and headaches.  Psychiatric/Behavioral: Positive for sleep disturbance, dysphoric mood and decreased concentration.       Objective:   Physical Exam  Constitutional: She is oriented to person, place, and time. She appears well-developed and well-nourished. No distress.  HENT:  Head: Normocephalic and atraumatic.  Eyes: EOM are normal. Pupils are equal, round, and reactive to light.  Neck: Normal range of motion. Neck supple.  Cardiovascular: Normal rate and regular rhythm.   No murmur heard. Pulmonary/Chest: Effort normal and breath sounds normal. No respiratory distress. She has no wheezes. She  has no rales. She exhibits no tenderness.  Abdominal: Soft. Bowel sounds are normal. She exhibits no distension and no mass. There is no tenderness. There is no rebound and no guarding.  Musculoskeletal: She exhibits no tenderness.  Walks normally into and out of the clinic.   Neurological: She is alert and oriented to person, place, and time. No cranial nerve deficit.  Skin: Skin is warm and dry. She is not diaphoretic.       Assessment/Plan:   1. Please see problem oriented charting.  2. Disposition - Patient will see Dr. Sharl Ma next month and neuro-rehab on Monday. Have reinforced the need to keep the next endocrine appointment with Dr. Sharl Ma. She agrees with this. She was sent in meloxicam to help with her pain and will check vitamin B12 level today as could not find record of it in the past.

## 2013-11-21 NOTE — Progress Notes (Signed)
Case discussed with Dr. Kollar at the time of the visit.  We reviewed the resident's history and exam and pertinent patient test results.  I agree with the assessment, diagnosis, and plan of care documented in the resident's note.     

## 2013-11-21 NOTE — Telephone Encounter (Signed)
Patient requested assistance with meter. New battery installed again and it seems to be working so she can finish using her strips. She wanted a new meter which was also provided today and requested prescriptions for supplies be sent to [facility] on [address].

## 2013-11-21 NOTE — Assessment & Plan Note (Signed)
BP good today and will continue current regimen.

## 2013-11-22 LAB — VITAMIN B12: Vitamin B-12: 427 pg/mL (ref 211–911)

## 2013-11-25 ENCOUNTER — Other Ambulatory Visit: Payer: Self-pay | Admitting: Internal Medicine

## 2013-11-26 ENCOUNTER — Ambulatory Visit: Payer: PRIVATE HEALTH INSURANCE | Attending: Internal Medicine | Admitting: Physical Therapy

## 2013-11-26 DIAGNOSIS — IMO0001 Reserved for inherently not codable concepts without codable children: Secondary | ICD-10-CM | POA: Insufficient documentation

## 2013-11-26 DIAGNOSIS — M6281 Muscle weakness (generalized): Secondary | ICD-10-CM | POA: Insufficient documentation

## 2013-11-26 DIAGNOSIS — R269 Unspecified abnormalities of gait and mobility: Secondary | ICD-10-CM | POA: Insufficient documentation

## 2013-11-27 NOTE — Progress Notes (Signed)
Talked with Tobi Bastos - pt has appts with neuro PT.

## 2013-12-03 ENCOUNTER — Ambulatory Visit: Payer: PRIVATE HEALTH INSURANCE | Admitting: Physical Therapy

## 2013-12-05 ENCOUNTER — Ambulatory Visit: Payer: PRIVATE HEALTH INSURANCE | Admitting: Physical Therapy

## 2013-12-10 ENCOUNTER — Ambulatory Visit: Payer: PRIVATE HEALTH INSURANCE | Admitting: Physical Therapy

## 2013-12-12 ENCOUNTER — Other Ambulatory Visit: Payer: Self-pay | Admitting: Internal Medicine

## 2013-12-13 ENCOUNTER — Ambulatory Visit: Payer: PRIVATE HEALTH INSURANCE | Admitting: Physical Therapy

## 2013-12-17 ENCOUNTER — Ambulatory Visit: Payer: PRIVATE HEALTH INSURANCE | Admitting: Physical Therapy

## 2013-12-24 ENCOUNTER — Ambulatory Visit: Payer: PRIVATE HEALTH INSURANCE | Admitting: Physical Therapy

## 2013-12-26 ENCOUNTER — Ambulatory Visit: Payer: Medicare Other | Admitting: Physical Therapy

## 2013-12-28 ENCOUNTER — Other Ambulatory Visit: Payer: Self-pay | Admitting: Internal Medicine

## 2013-12-31 ENCOUNTER — Ambulatory Visit: Payer: Medicare Other | Admitting: Physical Therapy

## 2014-02-01 ENCOUNTER — Other Ambulatory Visit: Payer: Self-pay | Admitting: Internal Medicine

## 2014-02-04 ENCOUNTER — Telehealth: Payer: Self-pay | Admitting: Internal Medicine

## 2014-02-04 NOTE — Telephone Encounter (Signed)
C/D 02/04/14 for appt. 02/05/14 °

## 2014-02-04 NOTE — Telephone Encounter (Signed)
NEW PATIENT SCHEDULED FOR 02/10 @ 10:30 W/DR. CHISM.  REFERRING DR. Gwyndolyn Saxon OUTLAW DX- IDA, NEGATIVE GI WORK-UP

## 2014-02-05 ENCOUNTER — Encounter: Payer: Self-pay | Admitting: Internal Medicine

## 2014-02-05 ENCOUNTER — Telehealth: Payer: Self-pay | Admitting: Internal Medicine

## 2014-02-05 ENCOUNTER — Ambulatory Visit (HOSPITAL_BASED_OUTPATIENT_CLINIC_OR_DEPARTMENT_OTHER): Payer: PRIVATE HEALTH INSURANCE

## 2014-02-05 ENCOUNTER — Ambulatory Visit (HOSPITAL_BASED_OUTPATIENT_CLINIC_OR_DEPARTMENT_OTHER): Payer: PRIVATE HEALTH INSURANCE | Admitting: Internal Medicine

## 2014-02-05 ENCOUNTER — Other Ambulatory Visit: Payer: PRIVATE HEALTH INSURANCE

## 2014-02-05 ENCOUNTER — Ambulatory Visit: Payer: PRIVATE HEALTH INSURANCE

## 2014-02-05 VITALS — BP 156/98 | HR 81 | Temp 97.6°F | Resp 19 | Ht 66.0 in | Wt 179.1 lb

## 2014-02-05 DIAGNOSIS — E213 Hyperparathyroidism, unspecified: Secondary | ICD-10-CM

## 2014-02-05 DIAGNOSIS — N289 Disorder of kidney and ureter, unspecified: Secondary | ICD-10-CM

## 2014-02-05 DIAGNOSIS — K7689 Other specified diseases of liver: Secondary | ICD-10-CM

## 2014-02-05 DIAGNOSIS — R718 Other abnormality of red blood cells: Secondary | ICD-10-CM | POA: Insufficient documentation

## 2014-02-05 DIAGNOSIS — D649 Anemia, unspecified: Secondary | ICD-10-CM

## 2014-02-05 DIAGNOSIS — D509 Iron deficiency anemia, unspecified: Secondary | ICD-10-CM

## 2014-02-05 DIAGNOSIS — F17200 Nicotine dependence, unspecified, uncomplicated: Secondary | ICD-10-CM

## 2014-02-05 DIAGNOSIS — R634 Abnormal weight loss: Secondary | ICD-10-CM

## 2014-02-05 DIAGNOSIS — D72820 Lymphocytosis (symptomatic): Secondary | ICD-10-CM

## 2014-02-05 LAB — COMPREHENSIVE METABOLIC PANEL (CC13)
ALT: 31 U/L (ref 0–55)
AST: 29 U/L (ref 5–34)
Albumin: 3.9 g/dL (ref 3.5–5.0)
Alkaline Phosphatase: 93 U/L (ref 40–150)
Anion Gap: 9 mEq/L (ref 3–11)
BUN: 13.7 mg/dL (ref 7.0–26.0)
CO2: 24 mEq/L (ref 22–29)
Calcium: 10.4 mg/dL (ref 8.4–10.4)
Chloride: 107 mEq/L (ref 98–109)
Creatinine: 0.8 mg/dL (ref 0.6–1.1)
Glucose: 134 mg/dl (ref 70–140)
Potassium: 4 mEq/L (ref 3.5–5.1)
Sodium: 140 mEq/L (ref 136–145)
Total Bilirubin: 0.5 mg/dL (ref 0.20–1.20)
Total Protein: 7 g/dL (ref 6.4–8.3)

## 2014-02-05 LAB — CBC & DIFF AND RETIC
BASO%: 0.4 % (ref 0.0–2.0)
Basophils Absolute: 0 10*3/uL (ref 0.0–0.1)
EOS%: 3.4 % (ref 0.0–7.0)
Eosinophils Absolute: 0.3 10*3/uL (ref 0.0–0.5)
HCT: 37.5 % (ref 34.8–46.6)
HGB: 11.8 g/dL (ref 11.6–15.9)
Immature Retic Fract: 8.2 % (ref 1.60–10.00)
LYMPH%: 56.6 % — ABNORMAL HIGH (ref 14.0–49.7)
MCH: 21.4 pg — ABNORMAL LOW (ref 25.1–34.0)
MCHC: 31.5 g/dL (ref 31.5–36.0)
MCV: 68.1 fL — ABNORMAL LOW (ref 79.5–101.0)
MONO#: 0.4 10*3/uL (ref 0.1–0.9)
MONO%: 5 % (ref 0.0–14.0)
NEUT#: 2.6 10*3/uL (ref 1.5–6.5)
NEUT%: 34.6 % — ABNORMAL LOW (ref 38.4–76.8)
Platelets: 207 10*3/uL (ref 145–400)
RBC: 5.51 10*6/uL — ABNORMAL HIGH (ref 3.70–5.45)
RDW: 15.9 % — ABNORMAL HIGH (ref 11.2–14.5)
Retic %: 1.12 % (ref 0.70–2.10)
Retic Ct Abs: 61.71 10*3/uL (ref 33.70–90.70)
WBC: 7.4 10*3/uL (ref 3.9–10.3)
lymph#: 4.2 10*3/uL — ABNORMAL HIGH (ref 0.9–3.3)
nRBC: 0 % (ref 0–0)

## 2014-02-05 LAB — FERRITIN CHCC: Ferritin: 40 ng/ml (ref 9–269)

## 2014-02-05 LAB — CHCC SMEAR

## 2014-02-05 LAB — IRON AND TIBC CHCC
%SAT: 28 % (ref 21–57)
Iron: 85 ug/dL (ref 41–142)
TIBC: 301 ug/dL (ref 236–444)
UIBC: 216 ug/dL (ref 120–384)

## 2014-02-05 LAB — TECHNOLOGIST REVIEW

## 2014-02-05 NOTE — Telephone Encounter (Signed)
pt sent back to lb and given schedule for feb

## 2014-02-05 NOTE — Progress Notes (Signed)
Checked in new pt with no financial concerns. °

## 2014-02-05 NOTE — Patient Instructions (Signed)

## 2014-02-06 ENCOUNTER — Other Ambulatory Visit: Payer: Self-pay | Admitting: Internal Medicine

## 2014-02-06 DIAGNOSIS — D649 Anemia, unspecified: Secondary | ICD-10-CM

## 2014-02-07 LAB — VITAMIN B12: Vitamin B-12: 376 pg/mL (ref 211–911)

## 2014-02-07 LAB — PROTEIN ELECTROPHORESIS, SERUM
Albumin ELP: 59.8 % (ref 55.8–66.1)
Alpha-1-Globulin: 4.1 % (ref 2.9–4.9)
Alpha-2-Globulin: 11.6 % (ref 7.1–11.8)
Beta 2: 6.5 % (ref 3.2–6.5)
Beta Globulin: 6.7 % (ref 4.7–7.2)
Gamma Globulin: 11.3 % (ref 11.1–18.8)
Total Protein, Serum Electrophoresis: 6.7 g/dL (ref 6.0–8.3)

## 2014-02-07 LAB — FOLATE: Folate: 8.7 ng/mL

## 2014-02-07 NOTE — Progress Notes (Signed)
[place] [phone]   [phone]  NEW PATIENT EVALUATION   Name: Susan Holmes Date: 02/07/2014 MRN: [id number] DOB: 02/13/1956  PCP: Vertell Novak, MD   REFERRING PHYSICIAN: Arta Silence, MD  REASON FOR REFERRAL:  Anemia NOS    HISTORY OF PRESENT ILLNESS:Susan Holmes is a 58 y.o. female who is being referred to our offices for further evaluation and management of her Anemia.  Patient was last seen by [facility] for a thorough evaluation of her anemia on 01/25/2014.  Prior she had a screening colorectal malignant colonoscopy on 09/14/2013 which revealed a sessile polyp in the transverse colon which was 15 mm in size; mild internal hemorrhoids.   Surgical pathology of the transverse polyp revealed tubular adenoma negative for high grade dyplasia.  She also had an upper GI endoscopy on 09/28/2013 which revealed a normal esophagus without masses or strictures, esophagitis, eosinophilic esophagitis.  There was a few dispersed small erosions found in the prepyloric region of the stomach.  Her CBC in 10/29/2013 revealed a normal wBC of 8.5; Hgb of 11.9 and hematocrit of 38.9 with an MCV of 69.4; RDW was elevated to 16.6 with a slight increase in Lymph% of 44.3 (16.8-43.5%).  On 01/25/2014.  Her WBC was 6.5; Hgb was low at 11.8;  Hemocrit was 37.6.Marland Kitchen  MCV wa 68.1 with an increased RDW of 16.2.  Plts were normal at 224.  Lymphs were mild elevated to 51.6.   Her t-Transglutaminase was negative.  Creatinine was 0.65 on 10/29/2013.  Calcium was mildly elevated to 10.7 (8.6-10.3).  She had a normal liver function with the exception of a mildly elevated AST of 49 (0-39).  ESR was 23 (0-20) and TSH was normal at 0.94.  Cortisol was normal at 10.6.    Initially, on 10/31/2013 shew had a CT abdomen pelvis with constrast for a 35lb weight lost and constipation.  It demonstrated circumferential wall thickening of the distal esophagus and an 1 cm  indeterminate lesion within the inferior pole of the kidney and a sub centimeter low-attenuation lesion within the liver, too small to accurately characterize. She has also had an MRI of abdomen without and with constrast on 11/11/2013 secondary to the left renal lesion on recent CT scan.  It showed a 9 mm lesion involving the lower pole of the left kidney representing a simple cyst.  No further imaging follow-up with this particular lesion was indicated.  In addition, there was a 8 mm lesion in the posterior right liver probably a cavernous hemangioma but could not be definitively characterized given motion degradation.  Follow up MRI without and with contrast in 6 months was recommended for re-evaluation.    Today, the patient reports occasional night sweats.  Her mammogram last year was negative for malignancy.  Her describes lower extremity weakness with prolonged ambulation.  She has had a 40 lb weight lost over the past 6 months to one year.  She described that in the beginning she could not eat due to decrease in her appetite for a about 3 months.  Now her appetite is coming back.  She has right shoulder pain.  She has intermittent diarrhea over the past one month.  She has an occasional headache.  She has not required blood transfusions in the past.  She was on iron supplementation in her teens.  She reported no history of menorrhagia.  She denies picca or pagophagia.  She is compliant to baby aspirin daily, Lipitor  40 mg daily, metformin and Lantus. She follows with her PCP Dr. Doug Sou regularly. She smokes about 1/2 pack of cigarettes per day for the past four decades.    PAST MEDICAL HISTORY:  has a past medical history of Hepatitis B infection (pt unsure); Diabetes mellitus, type II; HTN (hypertension); Hyperlipidemia; Congenital blindness; Tobacco abuse; Anxiety; Depression; Shortness of breath; and Arthritis.     PAST SURGICAL HISTORY: Past Surgical History  Procedure Laterality Date  .  Abdominal hysterectomy       CURRENT MEDICATIONS: has a current medication list which includes the following prescription(s): accu-chek fastclix lancets, amitriptyline, aspirin low dose, atorvastatin, accu-chek nano smartview, diclofenac sodium, gabapentin, glucose blood, insulin glargine, losartan, meloxicam, metformin, metoprolol tartrate, sertraline, trazodone, and mirtazapine.   ALLERGIES: Ace inhibitors   SOCIAL HISTORY:  reports that she has been smoking Cigarettes.  She has a 10 pack-year smoking history. Her smokeless tobacco use includes Chew. She reports that she does not drink alcohol or use illicit drugs.   FAMILY HISTORY: family history includes Aneurysm in her brother and sister; Heart disease (age of onset: 67) in her father and mother; Hypertension in her mother. There is no history of Colon cancer, Esophageal cancer, or Stomach cancer.   LABORATORY DATA:  CBC    Component Value Date/Time   WBC 7.4 02/05/2014 1506   WBC 6.0 09/24/2013 1235   RBC 5.51* 02/05/2014 1506   RBC 5.56* 09/24/2013 1235   HGB 11.8 02/05/2014 1506   HGB 12.1 09/24/2013 1235   HCT 37.5 02/05/2014 1506   HCT 38.4 09/24/2013 1235   PLT 207 02/05/2014 1506   PLT 313 09/24/2013 1235   MCV 68.1* 02/05/2014 1506   MCV 69.1* 09/24/2013 1235   MCH 21.4* 02/05/2014 1506   MCH 21.8* 09/24/2013 1235   MCHC 31.5 02/05/2014 1506   MCHC 31.5 09/24/2013 1235   RDW 15.9* 02/05/2014 1506   RDW 16.7* 09/24/2013 1235   LYMPHSABS 4.2* 02/05/2014 1506   LYMPHSABS 2.8 09/24/2013 1235   MONOABS 0.4 02/05/2014 1506   MONOABS 0.4 09/24/2013 1235   EOSABS 0.3 02/05/2014 1506   EOSABS 0.2 09/24/2013 1235   BASOSABS 0.0 02/05/2014 1506   BASOSABS 0.0 09/24/2013 1235    CMP     Component Value Date/Time   NA 140 02/05/2014 1506   NA 140 11/02/2013 1505   K 4.0 02/05/2014 1506   K 4.2 11/02/2013 1505   CL 108 11/02/2013 1505   CO2 24 02/05/2014 1506   CO2 25 11/02/2013 1505   GLUCOSE 134 02/05/2014 1506   GLUCOSE 76 11/02/2013 1505     BUN 13.7 02/05/2014 1506   BUN 6 11/02/2013 1505   CREATININE 0.8 02/05/2014 1506   CREATININE 0.62 11/02/2013 1505   CREATININE 0.68 09/24/2013 1235   CALCIUM 10.4 02/05/2014 1506   CALCIUM 9.6 11/02/2013 1505   CALCIUM 11.5* 03/06/2013 1653   PROT 7.0 02/05/2014 1506   PROT 7.2 09/24/2013 1235   ALBUMIN 3.9 02/05/2014 1506   ALBUMIN 3.7 09/24/2013 1235   AST 29 02/05/2014 1506   AST 36 09/24/2013 1235   ALT 31 02/05/2014 1506   ALT 24 09/24/2013 1235   ALKPHOS 93 02/05/2014 1506   ALKPHOS 98 09/24/2013 1235   BILITOT 0.50 02/05/2014 1506   BILITOT 0.3 09/24/2013 1235   GFRNONAA >90 09/24/2013 1235   GFRAA >90 09/24/2013 1235   Iron/TIBC/Ferritin    Component Value Date/Time   IRON 85 02/05/2014 1506   TIBC 301 02/05/2014 1506  FERRITIN 40 02/05/2014 1506   RADIOGRAPHY:   11/11/2013  MRI ABDOMEN WITHOUT AND WITH CONTRAST  TECHNIQUE: Multiplanar multisequence MR imaging of the abdomen was performed both before and after the administration of intravenous contrast. CONTRAST: 33m MULTIHANCE GADOBENATE DIMEGLUMINE 529 MG/ML IV SOLN  COMPARISON: CT scan from 10/31/2013 FINDINGS:  8 mm T1 hypointense, T2 hyperintense lesion is identified in the  posterior right liver (segment 7). The study is motion degraded  which hinders assessment of the lesion is small, but there may be some nodular peripheral enhancement associated with the lesion. No other focal hepatic abnormality is evident. There is no gross intrahepatic biliary duct dilatation. The spleen, stomach, duodenum, pancreas, gallbladder, and adrenal glands are unremarkable. Cortical scarring is noted in the right kidney and central sinus cysts are seen in the upper pole the right kidney. A 9 mm T1 hypointense, T2 hyperintense lesion is identified in the lower pole of the left kidney, corresponding to the abnormality on the recent CT scan. This lesion shows no enhancement after IV contrast administration. No abdominal aortic aneurysm. There is no free  fluid or lymphadenopathy in the abdomen. The abdominal bowel loops are unremarkable. No abnormal marrow enhancement is identified within the visualized skeleton.  IMPRESSION: 9 mm lesion involving the lower pole of left kidney represents a simple cyst. No further imaging followup with this particular lesion is indicated. 8 mm lesion in the posterior right liver it is probably a cavernous hemangioma but cannot be definitively characterized given motion degradation of this exam. Followup MRI without and with contrast in 6 months is recommended to re-evaluate.   REVIEW OF SYSTEMS:  Constitutional: Denies fevers, chills but does report abnormal weight loss Eyes: Denies blurriness of vision Ears, nose, mouth, throat, and face: Denies mucositis or sore throat Respiratory: Denies cough, dyspnea or wheezes Cardiovascular: Denies palpitation, chest discomfort or lower extremity swelling Gastrointestinal:  Denies nausea, heartburn but does report change in bowel habits Skin: Denies abnormal skin rashes Lymphatics: Denies new lymphadenopathy or easy bruising Neurological:Denies numbness, tingling but reports lowere extremity weaknesses Behavioral/Psych: Mood is stable, no new changes  All other systems were reviewed with the patient and are negative.  PHYSICAL EXAM:  height is '5\' 6"'  (1.676 m) and weight is 179 lb 1.6 oz (81.239 kg). Her oral temperature is 97.6 F (36.4 C). Her blood pressure is 156/98 and her pulse is 81. Her respiration is 19 and oxygen saturation is 96%.    GENERAL:alert, no distress and comfortable; well developed African-american female slightly overweight SKIN: skin color, texture, turgor are normal, no rashes or significant lesions EYES: normal, Conjunctiva are pink and non-injected, sclera clear OROPHARYNX:no exudate, no erythema and lips, buccal mucosa, and tongue normal  NECK: supple, thyroid normal size, non-tender, without nodularity LYMPH:  no palpable lymphadenopathy in  the cervical, axillary or inguinal LUNGS: clear to auscultation and percussion with normal breathing effort HEART: regular rate & rhythm and no murmurs and no lower extremity edema ABDOMEN:abdomen soft, non-tender and normal bowel sounds Musculoskeletal:no cyanosis of digits and no clubbing  NEURO: alert & oriented x 3 with fluent speech, no focal motor/sensory deficits   IMPRESSION: Susan LANUZAis a 58y.o. female with a history of    PLAN:  1.  Microcytic Anemia.  --Differential points away from GI causes given the extensive GI work up above.  Smear reviewed demonstrates a microcytic anemia with normal plts and WBCs.  We will send for thalassemia or hemoglobinopathy work-up.   If negative,  given a ferritin of 40, we could start a trial of ferrous sulfate 325 mg daily for one month with vitamin C.   She denies symptoms of anemia such as palpations, dyspnea on exertion.    2. Mild lymphocytosis. --If this persists, we will send peripheral blood for flow cytometry in consideration of CLL which can also cause anemia.   This could also in part explain weight lost.  3. Weight lost. --TSH is normal.  Her weight lost could have been functional due to dysphagia.  CT of abdomen and pelvis and MRI was negative for malignancy.   She is up-to-date on cancer screening with a recent mammogram reported as negative.   4. Liver/renal lesion on MRI --Follow-up is required to for further characterization of the liver lesion.  Renal lesion was felt to be a simple cyst.   5. Tobacco abuse. --Given her longstanding smoking history and weight lost, malignancy of the lung would have been a concerned.  Ct of chest with contrast on 10/31/2013 was negative. She did not want to attend tobacco cessation classes.  6. Follow-up.  --Patient will follow up in 2-4 weeks to discuss results of above testing and for a brief symptom check.   All questions were answered. The patient knows to call the clinic with any  problems, questions or concerns. We can certainly see the patient much sooner if necessary.  I spent 40 minutes counseling the patient face to face. The total time spent in the appointment was 60 minutes.    Jenyfer Trawick, MD 02/07/2014 4:22 AM

## 2014-02-08 LAB — HEMOGLOBINOPATHY EVALUATION
Hemoglobin Other: 0 %
Hgb A2 Quant: 2.2 % (ref 2.2–3.2)
Hgb A: 97.8 % (ref 96.8–97.8)
Hgb F Quant: 0 % (ref 0.0–2.0)
Hgb S Quant: 0 %

## 2014-02-10 ENCOUNTER — Emergency Department (HOSPITAL_COMMUNITY)
Admission: EM | Admit: 2014-02-10 | Discharge: 2014-02-10 | Disposition: A | Discharge status: Home / Self Care | Payer: PRIVATE HEALTH INSURANCE | Attending: Emergency Medicine | Admitting: Emergency Medicine

## 2014-02-10 ENCOUNTER — Emergency Department (HOSPITAL_COMMUNITY): Payer: PRIVATE HEALTH INSURANCE

## 2014-02-10 ENCOUNTER — Encounter (HOSPITAL_COMMUNITY): Payer: Self-pay | Admitting: Emergency Medicine

## 2014-02-10 DIAGNOSIS — M129 Arthropathy, unspecified: Secondary | ICD-10-CM | POA: Insufficient documentation

## 2014-02-10 DIAGNOSIS — R197 Diarrhea, unspecified: Secondary | ICD-10-CM | POA: Insufficient documentation

## 2014-02-10 DIAGNOSIS — Z791 Long term (current) use of non-steroidal anti-inflammatories (NSAID): Secondary | ICD-10-CM | POA: Insufficient documentation

## 2014-02-10 DIAGNOSIS — E785 Hyperlipidemia, unspecified: Secondary | ICD-10-CM | POA: Insufficient documentation

## 2014-02-10 DIAGNOSIS — R1033 Periumbilical pain: Secondary | ICD-10-CM | POA: Insufficient documentation

## 2014-02-10 DIAGNOSIS — H53149 Visual discomfort, unspecified: Secondary | ICD-10-CM | POA: Insufficient documentation

## 2014-02-10 DIAGNOSIS — R51 Headache: Secondary | ICD-10-CM | POA: Insufficient documentation

## 2014-02-10 DIAGNOSIS — F3289 Other specified depressive episodes: Secondary | ICD-10-CM | POA: Insufficient documentation

## 2014-02-10 DIAGNOSIS — E119 Type 2 diabetes mellitus without complications: Secondary | ICD-10-CM | POA: Insufficient documentation

## 2014-02-10 DIAGNOSIS — F17200 Nicotine dependence, unspecified, uncomplicated: Secondary | ICD-10-CM | POA: Insufficient documentation

## 2014-02-10 DIAGNOSIS — F329 Major depressive disorder, single episode, unspecified: Secondary | ICD-10-CM | POA: Insufficient documentation

## 2014-02-10 DIAGNOSIS — F411 Generalized anxiety disorder: Secondary | ICD-10-CM | POA: Insufficient documentation

## 2014-02-10 DIAGNOSIS — Z7982 Long term (current) use of aspirin: Secondary | ICD-10-CM | POA: Insufficient documentation

## 2014-02-10 DIAGNOSIS — I10 Essential (primary) hypertension: Secondary | ICD-10-CM | POA: Insufficient documentation

## 2014-02-10 DIAGNOSIS — Z794 Long term (current) use of insulin: Secondary | ICD-10-CM | POA: Insufficient documentation

## 2014-02-10 DIAGNOSIS — R519 Headache, unspecified: Secondary | ICD-10-CM

## 2014-02-10 DIAGNOSIS — H543 Unqualified visual loss, both eyes: Secondary | ICD-10-CM | POA: Insufficient documentation

## 2014-02-10 DIAGNOSIS — Z8619 Personal history of other infectious and parasitic diseases: Secondary | ICD-10-CM | POA: Insufficient documentation

## 2014-02-10 DIAGNOSIS — N390 Urinary tract infection, site not specified: Secondary | ICD-10-CM | POA: Insufficient documentation

## 2014-02-10 DIAGNOSIS — Z79899 Other long term (current) drug therapy: Secondary | ICD-10-CM | POA: Insufficient documentation

## 2014-02-10 LAB — CBC WITH DIFFERENTIAL/PLATELET
Basophils Absolute: 0 10*3/uL (ref 0.0–0.1)
Basophils Relative: 0 % (ref 0–1)
Eosinophils Absolute: 0.1 10*3/uL (ref 0.0–0.7)
Eosinophils Relative: 1 % (ref 0–5)
HCT: 40.2 % (ref 36.0–46.0)
Hemoglobin: 12.8 g/dL (ref 12.0–15.0)
Lymphocytes Relative: 14 % (ref 12–46)
Lymphs Abs: 1.1 10*3/uL (ref 0.7–4.0)
MCH: 21.5 pg — ABNORMAL LOW (ref 26.0–34.0)
MCHC: 31.8 g/dL (ref 30.0–36.0)
MCV: 67.6 fL — ABNORMAL LOW (ref 78.0–100.0)
Monocytes Absolute: 0.3 10*3/uL (ref 0.1–1.0)
Monocytes Relative: 4 % (ref 3–12)
Neutro Abs: 6.1 10*3/uL (ref 1.7–7.7)
Neutrophils Relative %: 81 % — ABNORMAL HIGH (ref 43–77)
Platelets: 196 10*3/uL (ref 150–400)
RBC: 5.95 MIL/uL — ABNORMAL HIGH (ref 3.87–5.11)
RDW: 15.3 % (ref 11.5–15.5)
WBC: 7.6 10*3/uL (ref 4.0–10.5)

## 2014-02-10 LAB — COMPREHENSIVE METABOLIC PANEL
ALT: 40 U/L — ABNORMAL HIGH (ref 0–35)
AST: 45 U/L — ABNORMAL HIGH (ref 0–37)
Albumin: 3.9 g/dL (ref 3.5–5.2)
Alkaline Phosphatase: 99 U/L (ref 39–117)
BUN: 11 mg/dL (ref 6–23)
CO2: 22 mEq/L (ref 19–32)
Calcium: 9.7 mg/dL (ref 8.4–10.5)
Chloride: 98 mEq/L (ref 96–112)
Creatinine, Ser: 0.76 mg/dL (ref 0.50–1.10)
GFR calc Af Amer: >90 mL/min (ref >90)
GFR calc non Af Amer: >90 mL/min (ref >90)
Glucose, Bld: 160 mg/dL — ABNORMAL HIGH (ref 70–99)
Potassium: 4.2 mEq/L (ref 3.7–5.3)
Sodium: 135 mEq/L — ABNORMAL LOW (ref 137–147)
Total Bilirubin: 0.5 mg/dL (ref 0.3–1.2)
Total Protein: 7.6 g/dL (ref 6.0–8.3)

## 2014-02-10 LAB — URINALYSIS, ROUTINE W REFLEX MICROSCOPIC
Bilirubin Urine: NEGATIVE
Glucose, UA: NEGATIVE mg/dL
Hgb urine dipstick: NEGATIVE
Ketones, ur: NEGATIVE mg/dL
Nitrite: NEGATIVE
Protein, ur: NEGATIVE mg/dL
Specific Gravity, Urine: 1.02 (ref 1.005–1.030)
Urobilinogen, UA: 1 mg/dL (ref 0.0–1.0)
pH: 6 (ref 5.0–8.0)

## 2014-02-10 LAB — URINE MICROSCOPIC-ADD ON

## 2014-02-10 MED ORDER — DEXAMETHASONE SODIUM PHOSPHATE 10 MG/ML IJ SOLN
10.0000 mg | Freq: Once | INTRAMUSCULAR | Status: AC
  Administered 2014-02-10: 10 mg via INTRAVENOUS
  Filled 2014-02-10: qty 1

## 2014-02-10 MED ORDER — DIPHENHYDRAMINE HCL 50 MG/ML IJ SOLN
25.0000 mg | Freq: Once | INTRAMUSCULAR | Status: AC
  Administered 2014-02-10: 25 mg via INTRAVENOUS
  Filled 2014-02-10: qty 1

## 2014-02-10 MED ORDER — CIPROFLOXACIN HCL 500 MG PO TABS: 500.0000 mg | ORAL_TABLET | Freq: Two times a day (BID) | ORAL | Status: DC

## 2014-02-10 MED ORDER — ACETAMINOPHEN 325 MG PO TABS
650.0000 mg | ORAL_TABLET | Freq: Once | ORAL | Status: AC
  Administered 2014-02-10: 650 mg via ORAL
  Filled 2014-02-10: qty 2

## 2014-02-10 MED ORDER — METOCLOPRAMIDE HCL 5 MG/ML IJ SOLN
10.0000 mg | Freq: Once | INTRAMUSCULAR | Status: AC
  Administered 2014-02-10: 10 mg via INTRAVENOUS
  Filled 2014-02-10: qty 2

## 2014-02-10 MED ORDER — SODIUM CHLORIDE 0.9 % IV BOLUS (SEPSIS)
1000.0000 mL | Freq: Once | INTRAVENOUS | Status: AC
  Administered 2014-02-10: 1000 mL via INTRAVENOUS

## 2014-02-10 MED ORDER — CIPROFLOXACIN HCL 500 MG PO TABS
500.0000 mg | ORAL_TABLET | Freq: Once | ORAL | Status: AC
  Administered 2014-02-10: 500 mg via ORAL
  Filled 2014-02-10: qty 1

## 2014-02-10 NOTE — ED Notes (Addendum)
Patient c/o headache x 2 weeks. Patient states she took muscle relaxants that she takes for back pain but had no relief. Patient c/o blurred vision, sensitivity to light. Patient denies neck stiffness.

## 2014-02-10 NOTE — ED Provider Notes (Addendum)
CSN: [id number]     Arrival date & time 02/10/14  1734 History   First MD Initiated Contact with Patient 02/10/14 1843     Chief Complaint  Patient presents with  . Headache     (Consider location/radiation/quality/duration/timing/severity/associated sxs/prior Treatment) Patient is a 58 y.o. female presenting with headaches. The history is provided by the patient.  Headache Pain location:  Frontal Quality:  Dull Radiates to:  Does not radiate Severity currently:  9/10 Severity at highest:  9/10 Onset quality:  Gradual Duration:  2 weeks Timing:  Constant Progression:  Waxing and waning Chronicity:  New Similar to prior headaches: no   Context: bright light and loud noise   Relieved by:  Nothing Worsened by:  Light and sound Ineffective treatments:  None tried Associated symptoms: abdominal pain, diarrhea, fever and photophobia   Associated symptoms: no cough, no focal weakness, no hearing loss, no loss of balance, no nausea, no neck pain, no neck stiffness, no sinus pressure, no URI, no visual change, no vomiting and no weakness   Abdominal pain:    Location:  Periumbilical   Quality:  Aching and bloating   Severity:  Moderate   Onset quality:  Gradual   Duration:  2 weeks   Timing:  Constant   Progression:  Unchanged Diarrhea:    Quality:  Watery   Severity:  Moderate   Duration:  2 weeks   Timing:  Intermittent   Progression:  Unchanged Risk factors comment:  Htn, dm   Past Medical History  Diagnosis Date  . Hepatitis B infection pt unsure    resolved  . Diabetes mellitus, type II   . HTN (hypertension)   . Hyperlipidemia   . Congenital blindness     L eye  . Tobacco abuse     .5 PPD smoker  . Anxiety   . Depression   . Shortness of breath   . Arthritis    Past Surgical History  Procedure Laterality Date  . Abdominal hysterectomy     Family History  Problem Relation Age of Onset  . Heart disease Mother 51  . Hypertension Mother   . Heart disease  Father 77  . Aneurysm Brother   . Aneurysm Sister   . Colon cancer Neg Hx   . Esophageal cancer Neg Hx   . Stomach cancer Neg Hx    History  Substance Use Topics  . Smoking status: Current Every Day Smoker -- 0.50 packs/day for 20 years    Types: Cigarettes  . Smokeless tobacco: Current User    Types: Chew     Comment: did not want to discuss any stopping or decreasing  . Alcohol Use: No   OB History   Grav Para Term Preterm Abortions TAB SAB Ect Mult Living                 Review of Systems  Constitutional: Positive for fever.  HENT: Negative for hearing loss and sinus pressure.   Eyes: Positive for photophobia.  Respiratory: Negative for cough.   Gastrointestinal: Positive for abdominal pain and diarrhea. Negative for nausea and vomiting.  Musculoskeletal: Negative for neck pain and neck stiffness.  Neurological: Positive for headaches. Negative for focal weakness and loss of balance.  All other systems reviewed and are negative.      Allergies  Ace inhibitors  Home Medications   Current Outpatient Rx  Name  Route  Sig  Dispense  Refill  . ACCU-CHEK FASTCLIX LANCETS MISC  Check blood sugar 2x a day as instructed dx cod 250.00 insulin requiring   102 each   5   . amitriptyline (ELAVIL) 75 MG tablet   Oral   Take 75 mg by mouth daily.          . ASPIRIN LOW DOSE 81 MG EC tablet      TAKE 1 TABLET BY MOUTH ONCE DAILY   120 tablet   0   . atorvastatin (LIPITOR) 40 MG tablet   Oral   Take 1 tablet (40 mg total) by mouth daily.   30 tablet   6   . Blood Glucose Monitoring Suppl (ACCU-CHEK NANO SMARTVIEW) W/DEVICE KIT   Does not apply   1 each by Does not apply route 2 (two) times daily.   1 kit   0   . diclofenac sodium (VOLTAREN) 1 % GEL   Topical   Apply 4 g topically 4 (four) times daily.   1 Tube   2   . gabapentin (NEURONTIN) 300 MG capsule      300 mg 2 (two) times daily.          Marland Kitchen glucose blood (ACCU-CHEK SMARTVIEW) test  strip      Check blood sugar 2x a day as instructed dx cod 250.00 insulin requiring   100 each   5   . insulin glargine (LANTUS) 100 UNIT/ML injection   Subcutaneous   Inject 15 Units into the skin at bedtime.         Marland Kitchen losartan (COZAAR) 50 MG tablet      TAKE 1 TABLET BY MOUTH EVERY DAY   30 tablet   3   . meloxicam (MOBIC) 7.5 MG tablet      TAKE 1 TABLET BY MOUTH DAILY   30 tablet   0   . metFORMIN (GLUCOPHAGE) 500 MG tablet      TAKE 1 TABLET BY MOUTH TWICE DAILY WITH A MEAL   60 tablet   3   . metoprolol tartrate (LOPRESSOR) 25 MG tablet      TAKE 1/2 TABLET BY MOUTH TWICE DAILY   30 tablet   0   . mirtazapine (REMERON) 15 MG tablet               . sertraline (ZOLOFT) 100 MG tablet   Oral   Take 2 tablets (200 mg total) by mouth daily.   60 tablet   3   . traZODone (DESYREL) 100 MG tablet   Oral   Take 200 mg by mouth at bedtime.          BP 168/101  Pulse 92  Temp(Src) 101.3 F (38.5 C) (Oral)  Resp 18  Ht '5\' 7"'  (1.702 m)  Wt 175 lb (79.379 kg)  BMI 27.40 kg/m2  SpO2 93% Physical Exam  Nursing note and vitals reviewed. Constitutional: She is oriented to person, place, and time. She appears well-developed and well-nourished. No distress.  Pt appears comfortable with her arms folded behind her head  HENT:  Head: Normocephalic and atraumatic.  Right Ear: Tympanic membrane and ear canal normal.  Left Ear: Tympanic membrane and ear canal normal.  Eyes: EOM are normal. Pupils are equal, round, and reactive to light.  Fundoscopic exam:      The right eye shows no papilledema.       The left eye shows no papilledema.  Neck: Normal range of motion and full passive range of motion without pain. Neck supple. No spinous process  tenderness and no muscular tenderness present. No Brudzinski's sign and no Kernig's sign noted.  Cardiovascular: Normal rate, regular rhythm, normal heart sounds and intact distal pulses.  Exam reveals no friction rub.    No murmur heard. Pulmonary/Chest: Effort normal and breath sounds normal. She has no wheezes. She has no rales.  Abdominal: Soft. Bowel sounds are normal. She exhibits no distension. There is tenderness in the periumbilical area. There is no rebound and no guarding.  Mild periumbilical tenderness. Abd is soft and no hernias palpated  Musculoskeletal: Normal range of motion. She exhibits no tenderness.  No edema  Lymphadenopathy:    She has no cervical adenopathy.  Neurological: She is alert and oriented to person, place, and time. She has normal strength. No cranial nerve deficit or sensory deficit. Gait normal.  Photophobia.  Normal gait and no cranial nerve deficits  Skin: Skin is warm and dry. No rash noted.  Psychiatric: She has a normal mood and affect. Her behavior is normal.    ED Course  Procedures (including critical care time) Labs Review Labs Reviewed  CBC WITH DIFFERENTIAL - Abnormal; Notable for the following:    RBC 5.95 (*)    MCV 67.6 (*)    MCH 21.5 (*)    Neutrophils Relative % 81 (*)    All other components within normal limits  COMPREHENSIVE METABOLIC PANEL - Abnormal; Notable for the following:    Sodium 135 (*)    Glucose, Bld 160 (*)    AST 45 (*)    ALT 40 (*)    All other components within normal limits  URINALYSIS, ROUTINE W REFLEX MICROSCOPIC - Abnormal; Notable for the following:    Leukocytes, UA MODERATE (*)    All other components within normal limits  URINE MICROSCOPIC-ADD ON   Imaging Review Ct Head Wo Contrast  02/10/2014   CLINICAL DATA:  Headache for 2 weeks.  EXAM: CT HEAD WITHOUT CONTRAST  TECHNIQUE: Contiguous axial images were obtained from the base of the skull through the vertex without intravenous contrast.  COMPARISON:  None.  FINDINGS: The brain appears normal without infarct, hemorrhage, mass lesion, mass effect, midline shift or abnormal extra-axial fluid collection. No hydrocephalus or pneumocephalus. The calvarium is intact.   IMPRESSION: Negative head CT.   Electronically Signed   By: Inge Rise M.D.   On: 02/10/2014 19:14   Dg Abd Acute W/chest  02/10/2014   CLINICAL DATA:  Abdominal pain  EXAM: ACUTE ABDOMEN SERIES (ABDOMEN 2 VIEW & CHEST 1 VIEW)  COMPARISON:  None.  FINDINGS: There is no evidence of dilated bowel loops or free intraperitoneal air. A spiculated appearing calcification projects within the right renal fossa region measuring 2 x 1 cm, in the midpole region. This finding likely reflects a dominant renal calculus with smaller adjacent calculi. A smaller a amorphous calcified density projects within the lower pole region of the right renal fossa measuring 5.2 mm. Heart size and mediastinal contours are within normal limits. Both lungs are clear. Calcifications identified within the superior periphery of the pelvis likely reflecting vascular calcifications.  IMPRESSION: Nonobstructive bowel gas pattern. Findings likely reflecting renal calculi on the right. Unremarkable chest radiograph.   Electronically Signed   By: Margaree Mackintosh M.D.   On: 02/10/2014 19:52    EKG Interpretation   None       MDM   Final diagnoses:  UTI (lower urinary tract infection)  Headache    Patient presenting with a two-week history of headache, mild  photophobia that is the same all day long not positional and has no other neurologic complaints. Patient is comfortably sitting in the room in no acute distress has no neurologic findings on exam and has absolutely no neck pain is able to fully range her neck without signs of meningitis. Consequently patient also has a temperature of 101.3. She states that the last 2 weeks she's also had diarrhea and some mild diffuse abdominal tenderness. She denies any blood in her stool and she denies any nausea or vomiting. She has had decreased by mouth intakes do to the abdominal cramping and diarrhea. She denies any urinary symptoms.  Of note patient is also hypertensive to 168/101.  Patient's headaches could be caused by the fever which is currently from an unknown source versus her hypertension. Patient did not display any signs of encephalitis or meningitis at this time. Also her symptoms have been going on for 2 weeks and feel that there was an infectious nature of her headache she would be more symptomatic at this time. She has no sinus tenderness on exam and denies any congestion or URI symptoms. Patient denies any recent antibiotic use travel or bad food exposure that would explain an infectious diarrhea. Blood sugars have been controlled around 150-160.  CBC, CMP, UA, acute abdominal series pending.  Patient given and headache cocktail and IV fluids  8:49 PM Labs show UTi but o/w unremarkable.  Most likely the cause of abd pain and fever.  Head CT and AAS wnl.  hA resolved after meds.  Will d/c home with cipro and follow [facility] wed if not improved.  Blanchie Dessert, MD 02/10/14 2050  Blanchie Dessert, MD 02/10/14 2052

## 2014-02-10 NOTE — ED Notes (Signed)
Pt c/o HA x 2 weeks. Intermittent diarrhea, A & O, febrile at home. Neuro intact. IV est, labs drawn, pt medicated per MD order. Lights dimmed for comfort

## 2014-02-10 NOTE — ED Notes (Signed)
Pt removed her own IV prior to RN entering room. No swelling/drainage noted at insertion site.

## 2014-02-18 ENCOUNTER — Ambulatory Visit (HOSPITAL_BASED_OUTPATIENT_CLINIC_OR_DEPARTMENT_OTHER): Payer: PRIVATE HEALTH INSURANCE | Admitting: Internal Medicine

## 2014-02-18 ENCOUNTER — Other Ambulatory Visit: Payer: Self-pay | Admitting: Internal Medicine

## 2014-02-18 ENCOUNTER — Telehealth: Payer: Self-pay | Admitting: Internal Medicine

## 2014-02-18 VITALS — BP 145/90 | HR 75 | Temp 97.9°F | Resp 20 | Ht 67.0 in | Wt 173.4 lb

## 2014-02-18 DIAGNOSIS — N390 Urinary tract infection, site not specified: Secondary | ICD-10-CM

## 2014-02-18 DIAGNOSIS — R634 Abnormal weight loss: Secondary | ICD-10-CM

## 2014-02-18 DIAGNOSIS — K7689 Other specified diseases of liver: Secondary | ICD-10-CM

## 2014-02-18 DIAGNOSIS — D649 Anemia, unspecified: Secondary | ICD-10-CM

## 2014-02-18 DIAGNOSIS — K769 Liver disease, unspecified: Secondary | ICD-10-CM | POA: Insufficient documentation

## 2014-02-18 DIAGNOSIS — F17200 Nicotine dependence, unspecified, uncomplicated: Secondary | ICD-10-CM

## 2014-02-18 DIAGNOSIS — D509 Iron deficiency anemia, unspecified: Secondary | ICD-10-CM

## 2014-02-18 DIAGNOSIS — N200 Calculus of kidney: Secondary | ICD-10-CM

## 2014-02-18 NOTE — Progress Notes (Signed)
Susan Holmes  Vertell Novak, MD [place] [address]  DIAGNOSIS: Anemia, unspecified - Plan: CBC with Differential, CBC with Differential, Comprehensive metabolic panel (Cmet) - [facility]  Hypercalcemia  TOBACCO ABUSE - Plan: Ambulatory referral to Smoking Cessation Program  Liver lesion, right lobe - Plan: MR Abdomen W Contrast  Chief Complaint  Patient presents with  . Anemia    CURRENT TREATMENT:  Observation  INTERVAL HISTORY: Susan Holmes 58 y.o. female with a history as noted below is here for follow-up.  She was seen by me on 02/06/2012 for evaluation of anemia.     As previously noted, she was last seen by [facility] for a thorough evaluation of her anemia on 01/25/2014. Prior she had a screening colorectal malignant colonoscopy on 09/14/2013 which revealed a sessile polyp in the transverse colon which was 15 mm in size; mild internal hemorrhoids. Surgical pathology of the transverse polyp revealed tubular adenoma negative for high grade dyplasia. She also had an upper GI endoscopy on 09/28/2013 which revealed a normal esophagus without masses or strictures, esophagitis, eosinophilic esophagitis. There was a few dispersed small erosions found in the prepyloric region of the stomach.   Her CBC in 10/29/2013 revealed a normal wBC of 8.5; Hgb of 11.9 and hematocrit of 38.9 with an MCV of 69.4; RDW was elevated to 16.6 with a slight increase in Lymph% of 44.3 (16.8-43.5%). On 01/25/2014. Her WBC was 6.5; Hgb was low at 11.8; Hemocrit was 37.6.Marland Kitchen MCV wa 68.1 with an increased RDW of 16.2. Plts were normal at 224. Lymphs were mild elevated to 51.6. Her t-Transglutaminase was negative. Creatinine was 0.65 on 10/29/2013. Calcium was mildly elevated to 10.7 (8.6-10.3). She had a normal liver function with the exception of a mildly elevated AST of 49 (0-39). ESR was 23 (0-20) and TSH was normal at 0.94. Cortisol was normal at  10.6.   Initially, on 10/31/2013 shew had a CT abdomen pelvis with constrast for a 35lb weight lost and constipation. It demonstrated circumferential wall thickening of the distal esophagus and an 1 cm indeterminate lesion within the inferior pole of the kidney and a sub centimeter low-attenuation lesion within the liver, too small to accurately characterize. She has also had an MRI of abdomen without and with constrast on 11/11/2013 secondary to the left renal lesion on recent CT scan. It showed a 9 mm lesion involving the lower pole of the left kidney representing a simple cyst. No further imaging follow-up with this particular lesion was indicated. In addition, there was a 8 mm lesion in the posterior right liver probably a cavernous hemangioma but could not be definitively characterized given motion degradation. Follow up MRI without and with contrast in 6 months was recommended for re-evaluation.   Today, the patient reports that she was seen in the ER for headache and dysuria and discharged with treatment for UTI.  She reports improvement in her dysuria.   She states that she cannot tolerate iron orally.   Her energy is improved today.  She follows with her PCP Dr. Doug Sou regularly. She smokes about 1/2 pack of cigarettes per day for the past four decades.   MEDICAL HISTORY: Past Medical History  Diagnosis Date  . Hepatitis B infection pt unsure    resolved  . Diabetes mellitus, type II   . HTN (hypertension)   . Hyperlipidemia   . Congenital blindness     L eye  . Tobacco abuse     .  5 PPD smoker  . Anxiety   . Depression   . Shortness of breath   . Arthritis     INTERIM HISTORY: has Type II diabetes mellitus with neurological manifestations, uncontrolled; HYPERLIPIDEMIA; ANXIETY; TOBACCO ABUSE; DEPRESSION; CONGENITAL BLINDNESS, LEFT EYE; HYPERTENSION, ESSENTIAL NOS; Rotator cuff syndrome of right shoulder; Hyperparathyroidism; Unspecified vitamin D deficiency; Fracture of multiple  ribs; Weakness; Loss of weight; Difficulty in swallowing; Anemia, unspecified; and Hypercalcemia on her problem list.    ALLERGIES:  is allergic to ace inhibitors.  MEDICATIONS: has a current medication list which includes the following prescription(s): accu-chek fastclix lancets, amitriptyline, aspirin ec, atorvastatin, ciprofloxacin, gabapentin, insulin glargine, losartan, meloxicam, metformin, metoprolol tartrate, mirtazapine, sertraline, and trazodone.  SURGICAL HISTORY:  Past Surgical History  Procedure Laterality Date  . Abdominal hysterectomy      REVIEW OF SYSTEMS:   Constitutional: Denies fevers, chills or abnormal weight loss Eyes: Denies blurriness of vision Ears, nose, mouth, throat, and face: Denies mucositis or sore throat Respiratory: Denies cough, dyspnea or wheezes Cardiovascular: Denies palpitation, chest discomfort or lower extremity swelling Gastrointestinal:  Denies nausea, heartburn or change in bowel habits Skin: Denies abnormal skin rashes Lymphatics: Denies new lymphadenopathy or easy bruising Neurological:Denies numbness, tingling or new weaknesses Behavioral/Psych: Mood is stable, no new changes  All other systems were reviewed with the patient and are negative.  PHYSICAL EXAMINATION: ECOG PERFORMANCE STATUS: 0 - Asymptomatic  Blood pressure 145/90, pulse 75, temperature 97.9 F (36.6 C), temperature source Oral, resp. rate 20, height _0  (1.702 m), weight 173 lb 6.4 oz (78.654 kg), SpO2 99.00%.  GENERAL:alert, no distress and comfortable; well developed and well nourished.  SKIN: skin color, texture, turgor are normal, no rashes or significant lesions EYES: normal, Conjunctiva are pink and non-injected, sclera clear OROPHARYNX:no exudate, no erythema and lips, buccal mucosa, and tongue normal; Poor dentition.  NECK: supple, thyroid normal size, non-tender, without nodularity LYMPH:  no palpable lymphadenopathy in the cervical, axillary or  supraclavicular LUNGS: clear to auscultation with normal breathing effort, no wheezes or rhonchi HEART: regular rate & rhythm and no murmurs and no lower extremity edema ABDOMEN:abdomen soft, non-tender and normal bowel sounds, slightly obese Musculoskeletal:no cyanosis of digits and no clubbing  NEURO: alert & oriented x 3 with fluent speech, no focal motor/sensory deficits  Labs:  Lab Results  Component Value Date   WBC 7.6 02/10/2014   HGB 12.8 02/10/2014   HCT 40.2 02/10/2014   MCV 67.6* 02/10/2014   PLT 196 02/10/2014   NEUTROABS 6.1 02/10/2014      Chemistry      Component Value Date/Time   NA 135* 02/10/2014 1821   NA 140 02/05/2014 1506   K 4.2 02/10/2014 1821   K 4.0 02/05/2014 1506   CL 98 02/10/2014 1821   CO2 22 02/10/2014 1821   CO2 24 02/05/2014 1506   BUN 11 02/10/2014 1821   BUN 13.7 02/05/2014 1506   CREATININE 0.76 02/10/2014 1821   CREATININE 0.8 02/05/2014 1506   CREATININE 0.62 11/02/2013 1505      Component Value Date/Time   CALCIUM 9.7 02/10/2014 1821   CALCIUM 10.4 02/05/2014 1506   CALCIUM 11.5* 03/06/2013 1653   ALKPHOS 99 02/10/2014 1821   ALKPHOS 93 02/05/2014 1506   AST 45* 02/10/2014 1821   AST 29 02/05/2014 1506   ALT 40* 02/10/2014 1821   ALT 31 02/05/2014 1506   BILITOT 0.5 02/10/2014 1821   BILITOT 0.50 02/05/2014 1506     Results for Ciszek, Oluwadara  Carlean Jews (MRN [id number]) as of 02/18/2014 09:02  Ref. Range 02/05/2014 15:06  Albumin ELP Latest Range: 55.8-66.1 % 59.8  COMMENT (PROTEIN ELECTROPHOR) No range found *  Alpha-1-Globulin Latest Range: 2.9-4.9 % 4.1  Alpha-2-Globulin Latest Range: 7.1-11.8 % 11.6  Beta Globulin Latest Range: 4.7-7.2 % 6.7  Beta 2 Latest Range: 3.2-6.5 % 6.5  Gamma Globulin Latest Range: 11.1-18.8 % 11.3  M-SPIKE, % No range found NOT DET  SPE Interp. No range found *  Total Protein, Serum Electrophoresis Latest Range: 6.0-8.3 g/dL 6.7   Results for Moan, Mikia L (MRN [id number]) as of 02/18/2014 09:02  Ref. Range 02/05/2014 11:36   Hgb A Latest Range: 96.8-97.8 % 97.8  Hgb A2 Quant Latest Range: 2.2-3.2 % 2.2  Hgb F Quant Latest Range: 0.0-2.0 % 0.0  Hgb S Quant Latest Range: 0.0 % 0.0  Hemoglobin Other Latest Range: 0.0 % 0.0   RADIOGRAPHIC STUDIES: Ct Head Wo Contrast  02/10/2014   CLINICAL DATA:  Headache for 2 weeks.  EXAM: CT HEAD WITHOUT CONTRAST  TECHNIQUE: Contiguous axial images were obtained from the base of the skull through the vertex without intravenous contrast.  COMPARISON:  None.  FINDINGS: The brain appears normal without infarct, hemorrhage, mass lesion, mass effect, midline shift or abnormal extra-axial fluid collection. No hydrocephalus or pneumocephalus. The calvarium is intact.  IMPRESSION: Negative head CT.   Electronically Signed   By: Inge Rise M.D.   On: 02/10/2014 19:14   Dg Abd Acute W/chest  02/10/2014   CLINICAL DATA:  Abdominal pain  EXAM: ACUTE ABDOMEN SERIES (ABDOMEN 2 VIEW & CHEST 1 VIEW)  COMPARISON:  None.  FINDINGS: There is no evidence of dilated bowel loops or free intraperitoneal air. A spiculated appearing calcification projects within the right renal fossa region measuring 2 x 1 cm, in the midpole region. This finding likely reflects a dominant renal calculus with smaller adjacent calculi. A smaller a amorphous calcified density projects within the lower pole region of the right renal fossa measuring 5.2 mm. Heart size and mediastinal contours are within normal limits. Both lungs are clear. Calcifications identified within the superior periphery of the pelvis likely reflecting vascular calcifications.  IMPRESSION: Nonobstructive bowel gas pattern. Findings likely reflecting renal calculi on the right. Unremarkable chest radiograph.   Electronically Signed   By: Margaree Mackintosh M.D.   On: 02/10/2014 19:52    ASSESSMENT: Susan Holmes 58 y.o. female with a history of Anemia, unspecified - Plan: CBC with Differential, CBC with Differential, Comprehensive metabolic panel (Cmet) -  [facility]  Hypercalcemia  TOBACCO ABUSE - Plan: Ambulatory referral to Smoking Cessation Program  Liver lesion, right lobe - Plan: MR Abdomen W Contrast   PLAN:   1. Microcytic Anemia.  --Differential points away from GI causes given the extensive GI work up above. Hemoglobinopathy work-up was negative as noted above. If negative, given a ferritin of 40, we could start a trial of ferrous sulfate 325 mg daily for one month with vitamin C. However, she reports that she is not tolerable of oral iron.  She denies symptoms of anemia such as palpations, dyspnea on exertion.  We will observe and repeat CBC in one month.  Hemoglobin is within normal limits today.  SPEP was also negative.   2. UTI with R kidney stone as noted above. --Complete Cipro.    3. Weight lost.  --TSH is normal. Her weight lost could have been functional due to dysphagia. CT of abdomen and pelvis and MRI was  negative for malignancy. She is up-to-date on cancer screening with a recent mammogram reported as negative.   4. Liver (33m post right)/renal lesion on MRI  --Follow-up is required to for further characterization of the liver lesion. Renal lesion was felt to be a simple cyst. Followup MRI without and with contrast in 6 months (05/11/2014).  History of hepatitis B infection noted.   5. Tobacco abuse.  --Given her longstanding smoking history and weight lost, malignancy of the lung would have been a concerned. Ct of chest with contrast on 10/31/2013 was negative. She did want to attend tobacco cessation classes. A referral was made.   6. Follow-up.  --Patient will follow up in 3 months with labs including chemistries and CBC and for a brief symptom check. She will require a follow-up MRI of abdomen as recommended based on her prior image in November of last year.  Labs in one month to establish trend in her hemoglobin.  All questions were answered. The patient knows to call the clinic with any problems, questions or  concerns. We can certainly see the patient much sooner if necessary.  I spent 15 minutes counseling the patient face to face. The total time spent in the appointment was 25 minutes.    Anshul Meddings, MD 02/18/2014 9:38 AM

## 2014-02-18 NOTE — Telephone Encounter (Signed)
gv adn printed aptp sched and avs for pt for March and May

## 2014-02-28 ENCOUNTER — Ambulatory Visit (INDEPENDENT_AMBULATORY_CARE_PROVIDER_SITE_OTHER): Payer: PRIVATE HEALTH INSURANCE | Admitting: Internal Medicine

## 2014-02-28 ENCOUNTER — Telehealth: Payer: Self-pay | Admitting: *Deleted

## 2014-02-28 ENCOUNTER — Encounter: Payer: Self-pay | Admitting: Internal Medicine

## 2014-02-28 VITALS — BP 123/86 | HR 85 | Temp 96.8°F | Ht 67.0 in | Wt 169.1 lb

## 2014-02-28 DIAGNOSIS — E1165 Type 2 diabetes mellitus with hyperglycemia: Principal | ICD-10-CM

## 2014-02-28 DIAGNOSIS — E1149 Type 2 diabetes mellitus with other diabetic neurological complication: Secondary | ICD-10-CM

## 2014-02-28 DIAGNOSIS — IMO0002 Reserved for concepts with insufficient information to code with codable children: Secondary | ICD-10-CM

## 2014-02-28 DIAGNOSIS — E785 Hyperlipidemia, unspecified: Secondary | ICD-10-CM

## 2014-02-28 DIAGNOSIS — E213 Hyperparathyroidism, unspecified: Secondary | ICD-10-CM

## 2014-02-28 LAB — BASIC METABOLIC PANEL WITH GFR
BUN: 18 mg/dL (ref 6–23)
CO2: 22 mEq/L (ref 19–32)
Calcium: 10.7 mg/dL — ABNORMAL HIGH (ref 8.4–10.5)
Chloride: 92 mEq/L — ABNORMAL LOW (ref 96–112)
Creat: 0.75 mg/dL (ref 0.50–1.10)
GFR, Est African American: >89 mL/min
GFR, Est Non African American: 88 mL/min
Glucose, Bld: 581 mg/dL (ref 70–99)
Potassium: 4.5 mEq/L (ref 3.5–5.3)
Sodium: 131 mEq/L — ABNORMAL LOW (ref 135–145)

## 2014-02-28 LAB — GLUCOSE, CAPILLARY
Glucose-Capillary: 578 mg/dL (ref 70–99)
Glucose-Capillary: >600 mg/dL (ref 70–99)
Glucose-Capillary: 533 mg/dL — ABNORMAL HIGH (ref 70–99)

## 2014-02-28 LAB — POCT GLYCOSYLATED HEMOGLOBIN (HGB A1C): Hemoglobin A1C: 9.6

## 2014-02-28 MED ORDER — INSULIN ASPART 100 UNIT/ML ~~LOC~~ SOLN
5.0000 [IU] | Freq: Once | SUBCUTANEOUS | Status: AC
  Administered 2014-02-28: 5 [IU] via SUBCUTANEOUS

## 2014-02-28 MED ORDER — INSULIN GLARGINE 100 UNIT/ML ~~LOC~~ SOLN: 20.0000 [IU] | Freq: Every day | SUBCUTANEOUS | Status: DC

## 2014-02-28 NOTE — Telephone Encounter (Signed)
Pt called with c/o BP and CBG has been high for 2 weeks. Seen in ED for headache on 2/15 ( now improved ) BP's have been 186/102, 168/1something CBG's have been 495 today.  She is taking all meds. On lantus and PO meds.  Will see today in afternoon

## 2014-02-28 NOTE — Patient Instructions (Signed)
We have given you some insulin here today but would like you to increase your metformin to 2 pills in the morning and 2 pills in the evening.   Also, increase your lantus to 20 units at night time. Please remember to keep checking your morning sugars. If they are less than 150 call our clinic.   We will see you back next week middle of the week to check on the sugars. Likely your infection caused them to go up.   If you cannot keep down food or water please call us or come to the ER. Our number is [phone]. Keep drinking lots of water until you return. This will help your kidneys pass the extra sugar. Avoid sugary beverages and foods.   Diabetes Meal Planning Guide The diabetes meal planning guide is a tool to help you plan your meals and snacks. It is important for people with diabetes to manage their blood glucose (sugar) levels. Choosing the right foods and the right amounts throughout your day will help control your blood glucose. Eating right can even help you improve your blood pressure and reach or maintain a healthy weight. CARBOHYDRATE COUNTING MADE EASY When you eat carbohydrates, they turn to sugar. This raises your blood glucose level. Counting carbohydrates can help you control this level so you feel better. When you plan your meals by counting carbohydrates, you can have more flexibility in what you eat and balance your medicine with your food intake. Carbohydrate counting simply means adding up the total amount of carbohydrate grams in your meals and snacks. Try to eat about the same amount at each meal. Foods with carbohydrates are listed below. Each portion below is 1 carbohydrate serving or 15 grams of carbohydrates. Ask your dietician how many grams of carbohydrates you should eat at each meal or snack. Grains and Starches  1 slice bread.   English muffin or hotdog/hamburger bun.   cup cold cereal (unsweetened).   cup cooked pasta or rice.   cup starchy vegetables  (corn, potatoes, peas, beans, winter squash).  1 tortilla (6 inches).   bagel.  1 waffle or pancake (size of a CD).   cup cooked cereal.  4 to 6 small crackers. *Whole grain is recommended. Fruit  1 cup fresh unsweetened berries, melon, papaya, pineapple.  1 small fresh fruit.   banana or mango.   cup fruit juice (4 oz unsweetened).   cup canned fruit in natural juice or water.  2 tbs dried fruit.  12 to 15 grapes or cherries. Milk and Yogurt  1 cup fat-free or 1% milk.  1 cup soy milk.  6 oz light yogurt with sugar-free sweetener.  6 oz low-fat soy yogurt.  6 oz plain yogurt. Vegetables  1 cup raw or  cup cooked is counted as 0 carbohydrates or a "free" food.  If you eat 3 or more servings at 1 meal, count them as 1 carbohydrate serving. Other Carbohydrates   oz chips or pretzels.   cup ice cream or frozen yogurt.   cup sherbet or sorbet.  2 inch square cake, no frosting.  1 tbs honey, sugar, jam, jelly, or syrup.  2 small cookies.  3 squares of graham crackers.  3 cups popcorn.  6 crackers.  1 cup broth-based soup.  Count 1 cup casserole or other mixed foods as 2 carbohydrate servings.  Foods with less than 20 calories in a serving may be counted as 0 carbohydrates or a "free" food. You may want to  purchase a book or computer software that lists the carbohydrate gram counts of different foods. In addition, the nutrition facts panel on the labels of the foods you eat are a good source of this information. The label will tell you how big the serving size is and the total number of carbohydrate grams you will be eating per serving. Divide this number by 15 to obtain the number of carbohydrate servings in a portion. Remember, 1 carbohydrate serving equals 15 grams of carbohydrate. SERVING SIZES Measuring foods and serving sizes helps you make sure you are getting the right amount of food. The list below tells how big or small some common  serving sizes are.  1 oz.........4 stacked dice.  3 oz........Marland KitchenDeck of cards.  1 tsp.......Marland KitchenTip of little finger.  1 tbs......Marland KitchenMarland KitchenThumb.  2 tbs.......Marland KitchenGolf ball.   cup......Marland KitchenHalf of a fist.  1 cup.......Marland KitchenA fist. SAMPLE DIABETES MEAL PLAN Below is a sample meal plan that includes foods from the grain and starches, dairy, vegetable, fruit, and meat groups. A dietician can individualize a meal plan to fit your calorie needs and tell you the number of servings needed from each food group. However, controlling the total amount of carbohydrates in your meal or snack is more important than making sure you include all of the food groups at every meal. You may interchange carbohydrate containing foods (dairy, starches, and fruits). The meal plan below is an example of a 2000 calorie diet using carbohydrate counting. This meal plan has 17 carbohydrate servings. Breakfast  1 cup oatmeal (2 carb servings).   cup light yogurt (1 carb serving).  1 cup blueberries (1 carb serving).   cup almonds. Snack  1 large apple (2 carb servings).  1 low-fat string cheese stick. Lunch  Chicken breast salad.  1 cup spinach.   cup chopped tomatoes.  2 oz chicken breast, sliced.  2 tbs low-fat [place] dressing.  12 whole-wheat crackers (2 carb servings).  12 to 15 grapes (1 carb serving).  1 cup low-fat milk (1 carb serving). Snack  1 cup carrots.   cup hummus (1 carb serving). Dinner  3 oz broiled salmon.  1 cup brown rice (3 carb servings). Snack  1  cups steamed broccoli (1 carb serving) drizzled with 1 tsp olive oil and lemon juice.  1 cup light pudding (2 carb servings). DIABETES MEAL PLANNING WORKSHEET Your dietician can use this worksheet to help you decide how many servings of foods and what types of foods are right for you.  BREAKFAST Food Group and Servings / Carb Servings Grain/Starches __________________________________ Dairy  __________________________________________ Vegetable ______________________________________ Fruit ___________________________________________ Meat __________________________________________ Fat ____________________________________________ LUNCH Food Group and Servings / Carb Servings Grain/Starches ___________________________________ Dairy ___________________________________________ Fruit ____________________________________________ Meat ___________________________________________ Fat _____________________________________________ W[employer] Food Group and Servings / Carb Servings Grain/Starches ___________________________________ Dairy ___________________________________________ Fruit ____________________________________________ Meat ___________________________________________ Fat _____________________________________________ SNACKS Food Group and Servings / Carb Servings Grain/Starches ___________________________________ Dairy ___________________________________________ Vegetable _______________________________________ Fruit ____________________________________________ Meat ___________________________________________ Fat _____________________________________________ DAILY TOTALS Starches _________________________ Vegetable ________________________ Fruit ____________________________ Dairy ____________________________ Meat ____________________________ Fat ______________________________ Document Released: 09/09/2005 Document Revised: 03/06/2012 Document Reviewed: 07/21/2009 [employer] Patient Information 2014 [employer]

## 2014-02-28 NOTE — Telephone Encounter (Signed)
Agree with appointment today

## 2014-02-28 NOTE — Assessment & Plan Note (Addendum)
Sugar elevated today at 579 and will given 5 units novolog to bring sugar down. She is well hydrated and well appearing on exam. Will increase lantus to 20 units nightly and metformin to 1000 mg bid. She will return on Tuesday for close follow up. Check BMP today with return parameter to call or go to ED with abdominal pain, vomiting, inability to tolerate liquids. Her sugar did decrease to 533 however she was eating some sweets between the novolog and the repeat sugar. She was strongly advised to not eat sweet foods or beverages.

## 2014-02-28 NOTE — Progress Notes (Signed)
Subjective:     Patient ID: Susan Holmes, female   DOB: 1956-11-30, 58 y.o.   MRN: [id number]  HPI The patient is a 58 YO female who comes into the clinic today for high sugars and high blood pressures at home. She is still recovering from a urinary tract infection 1-2 weeks ago. She still has several doses of antibiotics left. Her sugars have been high for 1-2 weeks and she is still taking her medicine although she is not able to describe to me the dose of her lantus. She is not having any hypoglycemic episodes or chills. She is not having abdominal pain. She has occasional nausea but is able to tolerate food and she has been drinking some water. She is urinating quite frequently. She is not having any chest pain. Her weights are stable recently.   Review of Systems  Constitutional: Positive for fatigue. Negative for fever, chills, diaphoresis, activity change, appetite change and unexpected weight change.  Respiratory: Positive for shortness of breath. Negative for cough, chest tightness and wheezing.   Cardiovascular: Negative for chest pain, palpitations and leg swelling.  Gastrointestinal: Positive for nausea. Negative for vomiting, abdominal pain and diarrhea.  Musculoskeletal: Positive for back pain. Negative for myalgias.  Skin: Negative.   Neurological: Positive for weakness. Negative for dizziness, tremors, seizures, syncope, facial asymmetry, speech difficulty, light-headedness, numbness and headaches.  Psychiatric/Behavioral: Positive for sleep disturbance, dysphoric mood and decreased concentration.       Objective:   Physical Exam  Constitutional: She is oriented to person, place, and time. She appears well-developed and well-nourished. No distress.  HENT:  Head: Normocephalic and atraumatic.  Eyes: EOM are normal. Pupils are equal, round, and reactive to light.  Neck: Normal range of motion. Neck supple.  Cardiovascular: Normal rate and regular rhythm.   No murmur  heard. Pulmonary/Chest: Effort normal and breath sounds normal. No respiratory distress. She has no wheezes. She has no rales. She exhibits no tenderness.  Abdominal: Soft. Bowel sounds are normal. She exhibits no distension and no mass. There is no tenderness. There is no rebound and no guarding.  Musculoskeletal: She exhibits no tenderness.  Walks normally into and out of the clinic.   Neurological: She is alert and oriented to person, place, and time. No cranial nerve deficit.  Skin: Skin is warm and dry. She is not diaphoretic.       Assessment/Plan:   1. Please see problem oriented charting.  2. Disposition - Novolog 5 units now for glucose of 579 and patient is well-appearing on exam. She is having mild nausea with no appetite disturbance. She will use lantus 20 units at night time and watch morning sugars diligently. She will be seen back in about 1 week to monitor closely her sugars and adjust her regimen as needed. Metformin increase to 1000 mg bid.

## 2014-02-28 NOTE — Assessment & Plan Note (Signed)
Note from Dr. Buddy Duty indicated that he wanted her evaluated by surgeon for parathyroidectomy although she does not seem interested in going and does not even remember that visit at all. Will likely refer to surgery at next visit in 1 week after glucose better controlled.

## 2014-03-01 ENCOUNTER — Encounter (HOSPITAL_COMMUNITY): Payer: Self-pay | Admitting: Emergency Medicine

## 2014-03-01 ENCOUNTER — Emergency Department (HOSPITAL_COMMUNITY)
Admission: EM | Admit: 2014-03-01 | Discharge: 2014-03-01 | Disposition: A | Discharge status: Home / Self Care | Payer: PRIVATE HEALTH INSURANCE | Attending: Emergency Medicine | Admitting: Emergency Medicine

## 2014-03-01 DIAGNOSIS — Z792 Long term (current) use of antibiotics: Secondary | ICD-10-CM | POA: Diagnosis not present

## 2014-03-01 DIAGNOSIS — R739 Hyperglycemia, unspecified: Secondary | ICD-10-CM

## 2014-03-01 DIAGNOSIS — Z7982 Long term (current) use of aspirin: Secondary | ICD-10-CM | POA: Insufficient documentation

## 2014-03-01 DIAGNOSIS — Z79899 Other long term (current) drug therapy: Secondary | ICD-10-CM | POA: Diagnosis not present

## 2014-03-01 DIAGNOSIS — H538 Other visual disturbances: Secondary | ICD-10-CM | POA: Diagnosis not present

## 2014-03-01 DIAGNOSIS — E785 Hyperlipidemia, unspecified: Secondary | ICD-10-CM | POA: Diagnosis not present

## 2014-03-01 DIAGNOSIS — H543 Unqualified visual loss, both eyes: Secondary | ICD-10-CM | POA: Insufficient documentation

## 2014-03-01 DIAGNOSIS — F411 Generalized anxiety disorder: Secondary | ICD-10-CM | POA: Diagnosis not present

## 2014-03-01 DIAGNOSIS — Z8619 Personal history of other infectious and parasitic diseases: Secondary | ICD-10-CM | POA: Insufficient documentation

## 2014-03-01 DIAGNOSIS — E119 Type 2 diabetes mellitus without complications: Secondary | ICD-10-CM | POA: Insufficient documentation

## 2014-03-01 DIAGNOSIS — F17200 Nicotine dependence, unspecified, uncomplicated: Secondary | ICD-10-CM | POA: Diagnosis not present

## 2014-03-01 DIAGNOSIS — Z791 Long term (current) use of non-steroidal anti-inflammatories (NSAID): Secondary | ICD-10-CM | POA: Insufficient documentation

## 2014-03-01 DIAGNOSIS — F329 Major depressive disorder, single episode, unspecified: Secondary | ICD-10-CM | POA: Diagnosis not present

## 2014-03-01 DIAGNOSIS — I10 Essential (primary) hypertension: Secondary | ICD-10-CM | POA: Diagnosis not present

## 2014-03-01 DIAGNOSIS — Z794 Long term (current) use of insulin: Secondary | ICD-10-CM | POA: Diagnosis not present

## 2014-03-01 DIAGNOSIS — F3289 Other specified depressive episodes: Secondary | ICD-10-CM | POA: Insufficient documentation

## 2014-03-01 DIAGNOSIS — M129 Arthropathy, unspecified: Secondary | ICD-10-CM | POA: Diagnosis not present

## 2014-03-01 LAB — URINALYSIS, ROUTINE W REFLEX MICROSCOPIC
Bilirubin Urine: NEGATIVE
Glucose, UA: >1000 mg/dL — AB
Hgb urine dipstick: NEGATIVE
Ketones, ur: NEGATIVE mg/dL
Nitrite: NEGATIVE
Protein, ur: NEGATIVE mg/dL
Specific Gravity, Urine: 1.035 — ABNORMAL HIGH (ref 1.005–1.030)
Urobilinogen, UA: 0.2 mg/dL (ref 0.0–1.0)
pH: 5.5 (ref 5.0–8.0)

## 2014-03-01 LAB — COMPREHENSIVE METABOLIC PANEL
ALT: 37 U/L — ABNORMAL HIGH (ref 0–35)
AST: 31 U/L (ref 0–37)
Albumin: 4.2 g/dL (ref 3.5–5.2)
Alkaline Phosphatase: 115 U/L (ref 39–117)
BUN: 18 mg/dL (ref 6–23)
CO2: 21 mEq/L (ref 19–32)
Calcium: 11.1 mg/dL — ABNORMAL HIGH (ref 8.4–10.5)
Chloride: 94 mEq/L — ABNORMAL LOW (ref 96–112)
Creatinine, Ser: 0.81 mg/dL (ref 0.50–1.10)
GFR calc Af Amer: >90 mL/min (ref >90)
GFR calc non Af Amer: 78 mL/min — ABNORMAL LOW (ref >90)
Glucose, Bld: 573 mg/dL (ref 70–99)
Potassium: 4.6 mEq/L (ref 3.7–5.3)
Sodium: 131 mEq/L — ABNORMAL LOW (ref 137–147)
Total Bilirubin: 0.4 mg/dL (ref 0.3–1.2)
Total Protein: 8.1 g/dL (ref 6.0–8.3)

## 2014-03-01 LAB — I-STAT CHEM 8, ED
BUN: 20 mg/dL (ref 6–23)
Calcium, Ion: 1.44 mmol/L — ABNORMAL HIGH (ref 1.12–1.23)
Chloride: 100 mEq/L (ref 96–112)
Creatinine, Ser: 0.9 mg/dL (ref 0.50–1.10)
Glucose, Bld: 584 mg/dL (ref 70–99)
HCT: 47 % — ABNORMAL HIGH (ref 36.0–46.0)
Hemoglobin: 16 g/dL — ABNORMAL HIGH (ref 12.0–15.0)
Potassium: 4.5 mEq/L (ref 3.7–5.3)
Sodium: 133 mEq/L — ABNORMAL LOW (ref 137–147)
TCO2: 24 mmol/L (ref 0–100)

## 2014-03-01 LAB — CBC
HCT: 40.8 % (ref 36.0–46.0)
Hemoglobin: 13.3 g/dL (ref 12.0–15.0)
MCH: 21.9 pg — ABNORMAL LOW (ref 26.0–34.0)
MCHC: 32.6 g/dL (ref 30.0–36.0)
MCV: 67.3 fL — ABNORMAL LOW (ref 78.0–100.0)
Platelets: 180 10*3/uL (ref 150–400)
RBC: 6.06 MIL/uL — ABNORMAL HIGH (ref 3.87–5.11)
RDW: 14.5 % (ref 11.5–15.5)
WBC: 7.1 10*3/uL (ref 4.0–10.5)

## 2014-03-01 LAB — CBG MONITORING, ED
Glucose-Capillary: 522 mg/dL — ABNORMAL HIGH (ref 70–99)
Glucose-Capillary: 452 mg/dL — ABNORMAL HIGH (ref 70–99)
Glucose-Capillary: 289 mg/dL — ABNORMAL HIGH (ref 70–99)

## 2014-03-01 LAB — URINE MICROSCOPIC-ADD ON

## 2014-03-01 LAB — LIPID PANEL
Cholesterol: 266 mg/dL — ABNORMAL HIGH (ref 0–200)
HDL: 43 mg/dL (ref >39)
Total CHOL/HDL Ratio: 6.2 Ratio
Triglycerides: 451 mg/dL — ABNORMAL HIGH (ref <150)

## 2014-03-01 MED ORDER — SODIUM CHLORIDE 0.9 % IV SOLN
1000.0000 mL | Freq: Once | INTRAVENOUS | Status: AC
  Administered 2014-03-01: 1000 mL via INTRAVENOUS

## 2014-03-01 MED ORDER — SODIUM CHLORIDE 0.9 % IV SOLN
1000.0000 mL | INTRAVENOUS | Status: DC
  Administered 2014-03-01: 1000 mL via INTRAVENOUS

## 2014-03-01 MED ORDER — INSULIN ASPART 100 UNIT/ML ~~LOC~~ SOLN
15.0000 [IU] | Freq: Once | SUBCUTANEOUS | Status: AC
  Administered 2014-03-01: 15 [IU] via SUBCUTANEOUS
  Filled 2014-03-01: qty 1

## 2014-03-01 NOTE — ED Provider Notes (Signed)
Medical screening examination/treatment/procedure(s) were performed by non-physician practitioner and as supervising physician I was immediately available for consultation/collaboration.   EKG Interpretation None       Virgel Manifold, MD 03/01/14 1654

## 2014-03-01 NOTE — ED Provider Notes (Signed)
CSN: [id number]     Arrival date & time 03/01/14  1230 History   First MD Initiated Contact with Patient 03/01/14 1318     Chief Complaint  Patient presents with  . Hyperglycemia     (Consider location/radiation/quality/duration/timing/severity/associated sxs/prior Treatment) HPI  58 year old female with history of insulin-dependent diabetes, hepatitis C, hypertension, anxiety and depression who presents for evaluations of hyperglycemia.  Patient reports for the past 2 weeks she has noticed that her blood sugar has been running high. Usually in the 500s. She also endorsed feeling generalized weakness, polyuria, polydipsia, and also having blurry vision. 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 No associated fever or chills, no chest pain, no shortness of breath or productive cough or dysuria. No recent sickness. She has been taking medication as prescribed. She has been seen and evaluated by her PCP yesterday for this complaint and She was told to double up her metformin and increase her Lantus. States she follows direction however her blood pressure remains high in the 500s therefore she decided to come to the ER for further management.  Past Medical History  Diagnosis Date  . Hepatitis B infection pt unsure    resolved  . Diabetes mellitus, type II   . HTN (hypertension)   . Hyperlipidemia   . Congenital blindness     L eye  . Tobacco abuse     .5 PPD smoker  . Anxiety   . Depression   . Shortness of breath   . Arthritis    Past Surgical History  Procedure Laterality Date  . Abdominal hysterectomy     Family History  Problem Relation Age of Onset  . Heart disease Mother 22  . Hypertension Mother   . Heart disease Father 72  . Aneurysm Brother   . Aneurysm Sister   . Colon cancer Neg Hx   . Esophageal cancer Neg Hx   . Stomach cancer Neg Hx    History  Substance Use Topics  . Smoking status: Current Every Day Smoker -- 0.50 packs/day for 20 years   Types: Cigarettes  . Smokeless tobacco: Current User    Types: Chew     Comment: did not want to discuss any stopping or decreasing  . Alcohol Use: No   OB History   Grav Para Term Preterm Abortions TAB SAB Ect Mult Living                 Review of Systems  All other systems reviewed and are negative.      Allergies  Ace inhibitors  Home Medications   Current Outpatient Rx  Name  Route  Sig  Dispense  Refill  . ACCU-CHEK FASTCLIX LANCETS MISC      Check blood sugar 2x a day as instructed dx cod 250.00 insulin requiring   102 each   5   . amitriptyline (ELAVIL) 75 MG tablet   Oral   Take 75 mg by mouth daily.          Marland Kitchen aspirin EC 81 MG tablet   Oral   Take 81 mg by mouth daily.         Marland Kitchen atorvastatin (LIPITOR) 40 MG tablet   Oral   Take 1 tablet (40 mg total) by mouth daily.   30 tablet   6   . ciprofloxacin (CIPRO) 500 MG tablet   Oral   Take 1 tablet (500 mg total) by mouth 2 (two) times daily.   14 tablet  0   . gabapentin (NEURONTIN) 300 MG capsule      300 mg 2 (two) times daily.          . insulin glargine (LANTUS) 100 UNIT/ML injection   Subcutaneous   Inject 0.2 mLs (20 Units total) into the skin at bedtime.   10 mL   12   . losartan (COZAAR) 50 MG tablet   Oral   Take 50 mg by mouth daily.         . meloxicam (MOBIC) 7.5 MG tablet   Oral   Take 7.5 mg by mouth daily.         . metFORMIN (GLUCOPHAGE) 500 MG tablet   Oral   Take 500 mg by mouth 2 (two) times daily with a meal.         . metoprolol tartrate (LOPRESSOR) 25 MG tablet   Oral   Take 12.5 mg by mouth 2 (two) times daily.         . mirtazapine (REMERON) 15 MG tablet   Oral   Take 15 mg by mouth at bedtime.          . sertraline (ZOLOFT) 100 MG tablet   Oral   Take 2 tablets (200 mg total) by mouth daily.   60 tablet   3   . traZODone (DESYREL) 100 MG tablet   Oral   Take 200 mg by mouth at bedtime.          BP 113/77  Pulse 72  Temp(Src)  98.7 F (37.1 C) (Oral)  Resp 18  SpO2 97% Physical Exam  Nursing note and vitals reviewed. Constitutional: She is oriented to person, place, and time. She appears well-developed and well-nourished. No distress.  Awake, alert, nontoxic appearance  HENT:  Head: Atraumatic.  Oral mucosa dry  Eyes: Conjunctivae are normal. Right eye exhibits no discharge. Left eye exhibits no discharge.  Neck: Neck supple.  Cardiovascular: Normal rate and regular rhythm.   Pulmonary/Chest: Effort normal. No respiratory distress. She exhibits no tenderness.  Abdominal: Soft. There is no tenderness. There is no rebound.  Musculoskeletal: She exhibits no edema and no tenderness.  ROM appears intact, no obvious focal weakness  Neurological: She is alert and oriented to person, place, and time.  Mental status and motor strength appears intact  Skin: No rash noted.  Psychiatric: She has a normal mood and affect.    ED Course  Procedures (including critical care time)  Patient with history of insulin-dependent diabetes presents with hyperglycemia. She is mentating appropriately. She is afebrile with stable normal vital sign. She is in no acute respiratory distress. I suspect hyperosmolar nonketotic state. Initial CBG is 522. Workup initiated, IV fluid given.   4:22 PM No evidence of DKA.  CBG improves to 289 after IVF and 15 unit of insulin.  Pt has medication at home.  I recommend double up on her metformin and increase her lantus as previously recommended by her PCP.  Pt to f/u with PCP for further care . Return precaution discussed.  Care discussed with Dr. Wilson Singer.    Labs Review Labs Reviewed  CBC - Abnormal; Notable for the following:    RBC 6.06 (*)    MCV 67.3 (*)    MCH 21.9 (*)    All other components within normal limits  COMPREHENSIVE METABOLIC PANEL - Abnormal; Notable for the following:    Sodium 131 (*)    Chloride 94 (*)    Glucose, Bld 573 (*)  Calcium 11.1 (*)    ALT 37 (*)     GFR calc non Af Amer 78 (*)    All other components within normal limits  URINALYSIS, ROUTINE W REFLEX MICROSCOPIC - Abnormal; Notable for the following:    Specific Gravity, Urine 1.035 (*)    Glucose, UA >1000 (*)    Leukocytes, UA SMALL (*)    All other components within normal limits  URINE MICROSCOPIC-ADD ON - Abnormal; Notable for the following:    Bacteria, UA FEW (*)    All other components within normal limits  CBG MONITORING, ED - Abnormal; Notable for the following:    Glucose-Capillary 522 (*)    All other components within normal limits  I-STAT CHEM 8, ED - Abnormal; Notable for the following:    Sodium 133 (*)    Glucose, Bld 584 (*)    Calcium, Ion 1.44 (*)    Hemoglobin 16.0 (*)    HCT 47.0 (*)    All other components within normal limits  CBG MONITORING, ED - Abnormal; Notable for the following:    Glucose-Capillary 452 (*)    All other components within normal limits  CBG MONITORING, ED - Abnormal; Notable for the following:    Glucose-Capillary 289 (*)    All other components within normal limits   Imaging Review No results found.   EKG Interpretation None      MDM   Final diagnoses:  Hyperglycemia    BP 113/77  Pulse 72  Temp(Src) 98.7 F (37.1 C) (Oral)  Resp 18  SpO2 97%  I have reviewed nursing notes and vital signs.  I reviewed available ER/hospitalization records thought the EMR     Domenic Moras, [place] 03/01/14 1627

## 2014-03-01 NOTE — Discharge Instructions (Signed)
Please double up your metformin and increase lantus as previously recommended by your doctor.  Follow up closely with your doctor for further management.  Gargle with salt water several times daily for your sore throat.    Hyperglycemia Hyperglycemia occurs when the glucose (sugar) in your blood is too high. Hyperglycemia can happen for many reasons, but it most often happens to people who do not know they have diabetes or are not managing their diabetes properly.  CAUSES  Whether you have diabetes or not, there are other causes of hyperglycemia. Hyperglycemia can occur when you have diabetes, but it can also occur in other situations that you might not be as aware of, such as: Diabetes  If you have diabetes and are having problems controlling your blood glucose, hyperglycemia could occur because of some of the following reasons:  Not following your meal plan.  Not taking your diabetes medications or not taking it properly.  Exercising less or doing less activity than you normally do.  Being sick. Pre-diabetes  This cannot be ignored. Before people develop Type 2 diabetes, they almost always have "pre-diabetes." This is when your blood glucose levels are higher than normal, but not yet high enough to be diagnosed as diabetes. Research has shown that some long-term damage to the body, especially the heart and circulatory system, may already be occurring during pre-diabetes. If you take action to manage your blood glucose when you have pre-diabetes, you may delay or prevent Type 2 diabetes from developing. Stress  If you have diabetes, you may be "diet" controlled or on oral medications or insulin to control your diabetes. However, you may find that your blood glucose is higher than usual in the hospital whether you have diabetes or not. This is often referred to as "stress hyperglycemia." Stress can elevate your blood glucose. This happens because of hormones put out by the body during times of  stress. If stress has been the cause of your high blood glucose, it can be followed regularly by your caregiver. That way he/she can make sure your hyperglycemia does not continue to get worse or progress to diabetes. Steroids  Steroids are medications that act on the infection fighting system (immune system) to block inflammation or infection. One side effect can be a rise in blood glucose. Most people can produce enough extra insulin to allow for this rise, but for those who cannot, steroids make blood glucose levels go even higher. It is not unusual for steroid treatments to "uncover" diabetes that is developing. It is not always possible to determine if the hyperglycemia will go away after the steroids are stopped. A special blood test called an A1c is sometimes done to determine if your blood glucose was elevated before the steroids were started. SYMPTOMS  Thirsty.  Frequent urination.  Dry mouth.  Blurred vision.  Tired or fatigue.  Weakness.  Sleepy.  Tingling in feet or leg. DIAGNOSIS  Diagnosis is made by monitoring blood glucose in one or all of the following ways:  A1c test. This is a chemical found in your blood.  Fingerstick blood glucose monitoring.  Laboratory results. TREATMENT  First, knowing the cause of the hyperglycemia is important before the hyperglycemia can be treated. Treatment may include, but is not be limited to:  Education.  Change or adjustment in medications.  Change or adjustment in meal plan.  Treatment for an illness, infection, etc.  More frequent blood glucose monitoring.  Change in exercise plan.  Decreasing or stopping steroids.  Lifestyle changes. HOME CARE INSTRUCTIONS   Test your blood glucose as directed.  Exercise regularly. Your caregiver will give you instructions about exercise. Pre-diabetes or diabetes which comes on with stress is helped by exercising.  Eat wholesome, balanced meals. Eat often and at regular, fixed  times. Your caregiver or nutritionist will give you a meal plan to guide your sugar intake.  Being at an ideal weight is important. If needed, losing as little as 10 to 15 pounds may help improve blood glucose levels. SEEK MEDICAL CARE IF:   You have questions about medicine, activity, or diet.  You continue to have symptoms (problems such as increased thirst, urination, or weight gain). SEEK IMMEDIATE MEDICAL CARE IF:   You are vomiting or have diarrhea.  Your breath smells fruity.  You are breathing faster or slower.  You are very sleepy or incoherent.  You have numbness, tingling, or pain in your feet or hands.  You have chest pain.  Your symptoms get worse even though you have been following your caregiver's orders.  If you have any other questions or concerns. Document Released: 06/08/2001 Document Revised: 03/06/2012 Document Reviewed: 04/10/2012 [facility] Patient Information 2014 [place], [place].

## 2014-03-01 NOTE — ED Notes (Addendum)
Pt reports that has been having high blood sugars for about two weeks. Pt reports being seen by her PCP yesterday, which told her to double her metformin and increase her lantus. Pt states her blood sugars at home has been reading in the 500's. Blood sugar on arrival is 522. Pt reports fatigue and weight loss. Pt is A/O x4, in NAD, and vitals are WDL.

## 2014-03-01 NOTE — Progress Notes (Signed)
Case discussed with Dr. Doug Sou soon after the resident saw the patient.  We reviewed the resident's history and exam and pertinent patient test results.  I agree with the assessment, diagnosis and plan of care documented in the resident's note.

## 2014-03-01 NOTE — Addendum Note (Signed)
Addended by: Vertell Novak A on: 03/01/2014 10:42 AM   Modules accepted: Orders

## 2014-03-01 NOTE — ED Notes (Signed)
PA and RN are aware of high glucose on Chem 8

## 2014-03-02 LAB — LDL CHOLESTEROL, DIRECT: Direct LDL: 183 mg/dL — ABNORMAL HIGH

## 2014-03-03 ENCOUNTER — Other Ambulatory Visit: Payer: Self-pay | Admitting: Internal Medicine

## 2014-03-08 ENCOUNTER — Ambulatory Visit (INDEPENDENT_AMBULATORY_CARE_PROVIDER_SITE_OTHER): Payer: PRIVATE HEALTH INSURANCE | Admitting: Internal Medicine

## 2014-03-08 ENCOUNTER — Encounter: Payer: Self-pay | Admitting: Internal Medicine

## 2014-03-08 VITALS — BP 113/80 | HR 59 | Temp 97.0°F

## 2014-03-08 DIAGNOSIS — IMO0002 Reserved for concepts with insufficient information to code with codable children: Secondary | ICD-10-CM

## 2014-03-08 DIAGNOSIS — E1165 Type 2 diabetes mellitus with hyperglycemia: Principal | ICD-10-CM

## 2014-03-08 DIAGNOSIS — M1710 Unilateral primary osteoarthritis, unspecified knee: Secondary | ICD-10-CM

## 2014-03-08 DIAGNOSIS — E1149 Type 2 diabetes mellitus with other diabetic neurological complication: Secondary | ICD-10-CM

## 2014-03-08 DIAGNOSIS — M1711 Unilateral primary osteoarthritis, right knee: Secondary | ICD-10-CM

## 2014-03-08 LAB — HM DIABETES EYE EXAM

## 2014-03-08 LAB — GLUCOSE, CAPILLARY: Glucose-Capillary: 207 mg/dL — ABNORMAL HIGH (ref 70–99)

## 2014-03-08 MED ORDER — MELOXICAM 7.5 MG PO TABS: 7.5000 mg | ORAL_TABLET | Freq: Every day | ORAL | Status: DC

## 2014-03-08 MED ORDER — METFORMIN HCL 1000 MG PO TABS: 1000.0000 mg | ORAL_TABLET | Freq: Two times a day (BID) | ORAL | Status: DC

## 2014-03-08 MED ORDER — ASPIRIN EC 81 MG PO TBEC: 81.0000 mg | DELAYED_RELEASE_TABLET | Freq: Every day | ORAL | Status: DC

## 2014-03-08 NOTE — Assessment & Plan Note (Signed)
Sugars doing better at home per meter. Will increase lantus to 25 units nightly as her morning sugars are still 180-200. Continue metformin 1000 mg bid. Retinal camera took picture today for eye exam. Return in 1-2 months for close follow up of her sugars.

## 2014-03-08 NOTE — Patient Instructions (Signed)
We will increase your lantus to 25 units at night time.   Keep taking 2 of the metformin pills 2 times per day until you run out of pills. Then when you get the new prescription ask for the 1000 mg pills. Take 1 pill of those 2 times per day which is the same dose you are taking now.  Work on increasing your exercise to help with your knee pain. We have refilled mobic (meloxicam) which you can take 1 pill per day for the pain. If this is not enough take tylenol in addition as it is safe.   Come back in 1-2 months to check on the sugars.  Call us at [phone] with any problems or questions.   Osteoarthritis Osteoarthritis is a disease that causes soreness and swelling (inflammation) of a joint. It occurs when the cartilage at the affected joint wears down. Cartilage acts as a cushion, covering the ends of bones where they meet to form a joint. Osteoarthritis is the most common form of arthritis. It often occurs in older people. The joints affected most often by this condition include those in the:  Ends of the fingers.  Thumbs.  Neck.  Lower back.  Knees.  Hips. CAUSES  Over time, the cartilage that covers the ends of bones begins to wear away. This causes bone to rub on bone, producing pain and stiffness in the affected joints.  RISK FACTORS Certain factors can increase your chances of having osteoarthritis, including:  Older age.  Excessive body weight.  Overuse of joints. SIGNS AND SYMPTOMS   Pain, swelling, and stiffness in the joint.  Over time, the joint may lose its normal shape.  Small deposits of bone (osteophytes) may grow on the edges of the joint.  Bits of bone or cartilage can break off and float inside the joint space. This may cause more pain and damage. DIAGNOSIS  Your health care provider will do a physical exam and ask about your symptoms. Various tests may be ordered, such as:  X-rays of the affected joint.  An MRI scan.  Blood tests to rule out  other types of arthritis.  Joint fluid tests. This involves using a needle to draw fluid from the joint and examining the fluid under a microscope. TREATMENT  Goals of treatment are to control pain and improve joint function. Treatment plans may include:  A prescribed exercise program that allows for rest and joint relief.  A weight control plan.  Pain relief techniques, such as:  Properly applied heat and cold.  Electric pulses delivered to nerve endings under the skin (transcutaneous electrical nerve stimulation, TENS).  Massage.  Certain nutritional supplements.  Medicines to control pain, such as:  Acetaminophen.  Nonsteroidal anti-inflammatory drugs (NSAIDs), such as naproxen.  Narcotic or central-acting agents, such as tramadol.  Corticosteroids. These can be given orally or as an injection.  Surgery to reposition the bones and relieve pain (osteotomy) or to remove loose pieces of bone and cartilage. Joint replacement may be needed in advanced states of osteoarthritis. HOME CARE INSTRUCTIONS   Only take over-the-counter or prescription medicines as directed by your health care provider. Take all medicines exactly as instructed.  Maintain a healthy weight. Follow your health care provider's instructions for weight control. This may include dietary instructions.  Exercise as directed. Your health care provider can recommend specific types of exercise. These may include:  Strengthening exercises These are done to strengthen the muscles that support joints affected by arthritis. They can be  performed with weights or with exercise bands to add resistance.  Aerobic activities These are exercises, such as brisk walking or low-impact aerobics, that get your heart pumping.  Range-of-motion activities These keep your joints limber.  Balance and agility exercises These help you maintain daily living skills.  Rest your affected joints as directed by your health care  provider.  Follow up with your health care provider as directed. SEEK MEDICAL CARE IF:   Your skin turns red.  You develop a rash in addition to your joint pain.  You have worsening joint pain. SEEK IMMEDIATE MEDICAL CARE IF:  You have a significant loss of weight or appetite.  You have a fever along with joint or muscle aches.  You have night sweats. [place] of Arthritis and Musculoskeletal and Skin Diseases: www.niams.SouthExposed.es [employer] on Aging: http://kim-miller.com/ American College of Rheumatology: www.rheumatology.org Document Released: 12/13/2005 Document Revised: 10/03/2013 Document Reviewed: 08/20/2013 [facility] Patient Information 2014 [place], [place].

## 2014-03-08 NOTE — Progress Notes (Signed)
Subjective:     Patient ID: Susan Holmes, female   DOB: 1956-05-19, 58 y.o.   MRN: [id number]  HPI The patient is a 58 YO female who comes into the clinic today for follow up of her high sugars. She has made the increase to metformin 1000 mg bid and lantus 20 units nightly. She thinks that this has helped to bring her sugars down but not enough. She is still high 190-200 in the morning when she is checking.  She is not having any hypoglycemic episodes or chills. She is not having abdominal pain. Her nausea is resolved. She is not having any chest pain. Her weights are stable recently. Her right knee has been bothering her with arthritis lately and she is out of her meloxicam.   Review of Systems  Constitutional: Positive for fatigue. Negative for fever, chills, diaphoresis, activity change, appetite change and unexpected weight change.  Respiratory: Positive for shortness of breath. Negative for cough, chest tightness and wheezing.   Cardiovascular: Negative for chest pain, palpitations and leg swelling.  Gastrointestinal: Negative for nausea, vomiting, abdominal pain, diarrhea and constipation.  Musculoskeletal: Positive for back pain. Negative for myalgias.  Skin: Negative.   Neurological: Positive for weakness. Negative for dizziness, tremors, seizures, syncope, facial asymmetry, speech difficulty, light-headedness, numbness and headaches.  Psychiatric/Behavioral: Positive for sleep disturbance, dysphoric mood and decreased concentration.       Objective:   Physical Exam  Constitutional: She is oriented to person, place, and time. She appears well-developed and well-nourished. No distress.  HENT:  Head: Normocephalic and atraumatic.  Eyes: EOM are normal. Pupils are equal, round, and reactive to light.  Neck: Normal range of motion. Neck supple.  Cardiovascular: Normal rate and regular rhythm.   No murmur heard. Pulmonary/Chest: Effort normal and breath sounds normal. No respiratory  distress. She has no wheezes. She has no rales. She exhibits no tenderness.  Abdominal: Soft. Bowel sounds are normal. She exhibits no distension and no mass. There is no tenderness. There is no rebound and no guarding.  Musculoskeletal: She exhibits no tenderness.  Neurological: She is alert and oriented to person, place, and time. No cranial nerve deficit.  Skin: Skin is warm and dry. She is not diaphoretic.       Assessment/Plan:   1. Please see problem oriented charting.  2. Disposition - CBG much improved of 209 at visit. Advised to increase lantus to 25 units at night and continue with metformin 1000 mg BID. Return for close follow up in 1-2 months. Refill given for meloxicam. Retinal camera picture taken at today's visit.

## 2014-03-14 NOTE — Progress Notes (Signed)
Case discussed with Dr. Kollar at the time of the visit.  We reviewed the resident's history and exam and pertinent patient test results.  I agree with the assessment, diagnosis, and plan of care documented in the resident's note.     

## 2014-03-18 ENCOUNTER — Other Ambulatory Visit (HOSPITAL_BASED_OUTPATIENT_CLINIC_OR_DEPARTMENT_OTHER): Payer: PRIVATE HEALTH INSURANCE

## 2014-03-18 DIAGNOSIS — D509 Iron deficiency anemia, unspecified: Secondary | ICD-10-CM

## 2014-03-18 DIAGNOSIS — D649 Anemia, unspecified: Secondary | ICD-10-CM

## 2014-03-18 LAB — CBC WITH DIFFERENTIAL/PLATELET
BASO%: 0.5 % (ref 0.0–2.0)
Basophils Absolute: 0 10*3/uL (ref 0.0–0.1)
EOS%: 3 % (ref 0.0–7.0)
Eosinophils Absolute: 0.2 10*3/uL (ref 0.0–0.5)
HCT: 39 % (ref 34.8–46.6)
HGB: 12 g/dL (ref 11.6–15.9)
LYMPH%: 44.2 % (ref 14.0–49.7)
MCH: 21 pg — ABNORMAL LOW (ref 25.1–34.0)
MCHC: 30.9 g/dL — ABNORMAL LOW (ref 31.5–36.0)
MCV: 68 fL — ABNORMAL LOW (ref 79.5–101.0)
MONO#: 0.4 10*3/uL (ref 0.1–0.9)
MONO%: 6.1 % (ref 0.0–14.0)
NEUT#: 3.3 10*3/uL (ref 1.5–6.5)
NEUT%: 46.2 % (ref 38.4–76.8)
Platelets: 207 10*3/uL (ref 145–400)
RBC: 5.73 10*6/uL — ABNORMAL HIGH (ref 3.70–5.45)
RDW: 15.6 % — ABNORMAL HIGH (ref 11.2–14.5)
WBC: 7.1 10*3/uL (ref 3.9–10.3)
lymph#: 3.1 10*3/uL (ref 0.9–3.3)

## 2014-03-27 ENCOUNTER — Other Ambulatory Visit: Payer: Self-pay | Admitting: Internal Medicine

## 2014-03-27 NOTE — Telephone Encounter (Signed)
She should only be on 25 units nightly and I sent in a script at last visit. Will decline.  Dr. Doug Sou

## 2014-03-29 ENCOUNTER — Other Ambulatory Visit: Payer: Self-pay | Admitting: Internal Medicine

## 2014-03-30 ENCOUNTER — Other Ambulatory Visit: Payer: Self-pay | Admitting: Internal Medicine

## 2014-04-02 ENCOUNTER — Other Ambulatory Visit: Payer: Self-pay | Admitting: Internal Medicine

## 2014-04-02 NOTE — Telephone Encounter (Signed)
Please call pharmacy and make sure they have this discontinued. As of last visit in office she is supposed to be taking 25 units lantus at night time and rx for that was sent in.  I have already declined this last week.  Dr. Doug Sou

## 2014-04-02 NOTE — Telephone Encounter (Signed)
Pharmacy confirmed Rx they have is for Lantus 25 units at HS.  Pt may have requested off an old Rx.

## 2014-04-04 ENCOUNTER — Encounter: Payer: Self-pay | Admitting: Internal Medicine

## 2014-04-04 ENCOUNTER — Ambulatory Visit (INDEPENDENT_AMBULATORY_CARE_PROVIDER_SITE_OTHER): Payer: Commercial Managed Care - HMO | Admitting: Internal Medicine

## 2014-04-04 ENCOUNTER — Other Ambulatory Visit: Payer: Self-pay | Admitting: Internal Medicine

## 2014-04-04 VITALS — BP 160/88 | HR 65 | Temp 98.2°F | Wt 168.2 lb

## 2014-04-04 DIAGNOSIS — R197 Diarrhea, unspecified: Secondary | ICD-10-CM

## 2014-04-04 DIAGNOSIS — IMO0002 Reserved for concepts with insufficient information to code with codable children: Secondary | ICD-10-CM

## 2014-04-04 DIAGNOSIS — E213 Hyperparathyroidism, unspecified: Secondary | ICD-10-CM

## 2014-04-04 DIAGNOSIS — D1803 Hemangioma of intra-abdominal structures: Secondary | ICD-10-CM

## 2014-04-04 DIAGNOSIS — K769 Liver disease, unspecified: Secondary | ICD-10-CM

## 2014-04-04 DIAGNOSIS — R160 Hepatomegaly, not elsewhere classified: Secondary | ICD-10-CM

## 2014-04-04 DIAGNOSIS — E1165 Type 2 diabetes mellitus with hyperglycemia: Secondary | ICD-10-CM

## 2014-04-04 DIAGNOSIS — E1149 Type 2 diabetes mellitus with other diabetic neurological complication: Secondary | ICD-10-CM

## 2014-04-04 DIAGNOSIS — R109 Unspecified abdominal pain: Secondary | ICD-10-CM

## 2014-04-04 HISTORY — DX: Hemangioma of intra-abdominal structures: D18.03

## 2014-04-04 LAB — URINALYSIS, ROUTINE W REFLEX MICROSCOPIC
Bilirubin Urine: NEGATIVE
Glucose, UA: NEGATIVE mg/dL
Hgb urine dipstick: NEGATIVE
Ketones, ur: 15 mg/dL — AB
Nitrite: NEGATIVE
Protein, ur: NEGATIVE mg/dL
Specific Gravity, Urine: 1.02 (ref 1.005–1.030)
Urobilinogen, UA: 0.2 mg/dL (ref 0.0–1.0)
pH: 5.5 (ref 5.0–8.0)

## 2014-04-04 LAB — COMPLETE METABOLIC PANEL WITH GFR
ALT: 19 U/L (ref 0–35)
AST: 24 U/L (ref 0–37)
Albumin: 4.3 g/dL (ref 3.5–5.2)
Alkaline Phosphatase: 82 U/L (ref 39–117)
BUN: 9 mg/dL (ref 6–23)
CO2: 22 mEq/L (ref 19–32)
Calcium: 10.6 mg/dL — ABNORMAL HIGH (ref 8.4–10.5)
Chloride: 102 mEq/L (ref 96–112)
Creat: 0.68 mg/dL (ref 0.50–1.10)
GFR, Est African American: >89 mL/min
GFR, Est Non African American: >89 mL/min
Glucose, Bld: 69 mg/dL — ABNORMAL LOW (ref 70–99)
Potassium: 4.4 mEq/L (ref 3.5–5.3)
Sodium: 140 mEq/L (ref 135–145)
Total Bilirubin: 0.4 mg/dL (ref 0.3–1.2)
Total Protein: 7.6 g/dL (ref 6.0–8.3)

## 2014-04-04 LAB — CBC WITH DIFFERENTIAL/PLATELET
Basophils Absolute: 0 10*3/uL (ref 0.0–0.1)
Basophils Relative: 0 % (ref 0–1)
Eosinophils Absolute: 0.1 10*3/uL (ref 0.0–0.7)
Eosinophils Relative: 2 % (ref 0–5)
HCT: 40.1 % (ref 36.0–46.0)
Hemoglobin: 12.7 g/dL (ref 12.0–15.0)
Lymphocytes Relative: 50 % — ABNORMAL HIGH (ref 12–46)
Lymphs Abs: 3.7 10*3/uL (ref 0.7–4.0)
MCH: 21.9 pg — ABNORMAL LOW (ref 26.0–34.0)
MCHC: 31.7 g/dL (ref 30.0–36.0)
MCV: 69 fL — ABNORMAL LOW (ref 78.0–100.0)
Monocytes Absolute: 0.4 10*3/uL (ref 0.1–1.0)
Monocytes Relative: 6 % (ref 3–12)
Neutro Abs: 3.1 10*3/uL (ref 1.7–7.7)
Neutrophils Relative %: 42 % — ABNORMAL LOW (ref 43–77)
Platelets: 266 10*3/uL (ref 150–400)
RBC: 5.81 MIL/uL — ABNORMAL HIGH (ref 3.87–5.11)
RDW: 16.6 % — ABNORMAL HIGH (ref 11.5–15.5)
WBC: 7.3 10*3/uL (ref 4.0–10.5)

## 2014-04-04 LAB — LIPASE: Lipase: 18 U/L (ref 11–59)

## 2014-04-04 LAB — LACTIC ACID, PLASMA: LACTIC ACID: 0.3 mmol/L — ABNORMAL LOW (ref 0.5–2.2)

## 2014-04-04 LAB — GLUCOSE, CAPILLARY: Glucose-Capillary: 70 mg/dL (ref 70–99)

## 2014-04-04 LAB — URINALYSIS, MICROSCOPIC ONLY

## 2014-04-04 MED ORDER — ACIDOPHILUS PROBIOTIC 100 MG PO CAPS: 1.0000 | ORAL_CAPSULE | Freq: Every day | ORAL | Status: DC | PRN

## 2014-04-04 MED ORDER — LOPERAMIDE HCL 2 MG PO CAPS: 2.0000 mg | ORAL_CAPSULE | ORAL | Status: DC | PRN

## 2014-04-04 MED ORDER — INSULIN GLARGINE 100 UNIT/ML ~~LOC~~ SOLN: 20.0000 [IU] | Freq: Every day | SUBCUTANEOUS | Status: DC

## 2014-04-04 NOTE — Progress Notes (Signed)
Patient ID: Susan Holmes, female   DOB: 04/12/1956, 58 y.o.   MRN: [id number]    Subjective:   Patient ID: Susan Holmes female   DOB: [birth year] 58 y.o.   MRN: [id number]  HPI: [name] is a 58 y.o. female with a pmhx detailed below who comes to the clinic today with a cc of abdominal pain. The patient has trouble remember pmhx, thus her historical ability is questionable. She states that she has had abdominal pain for the last two weeks. The pain is in the middle of her belly, perhaps the lower part. This is associated with 3x daily loose watery BMs. She denies melena and hematochezia, fevers, chills. She admits to anorexia and nausea but no vomiting. She reports no constipation. She admits to malaise and fatigue. She denies light headedness or dizziness. She denies SOB and chest pain.    Past Medical History  Diagnosis Date  . Hepatitis B infection pt unsure    resolved  . Diabetes mellitus, type II   . HTN (hypertension)   . Hyperlipidemia   . Congenital blindness     L eye  . Tobacco abuse     .5 PPD smoker  . Anxiety   . Depression   . Shortness of breath   . Arthritis    Current Outpatient Prescriptions  Medication Sig Dispense Refill  . ACCU-CHEK FASTCLIX LANCETS MISC Check blood sugar 2x a day as instructed dx cod 250.00 insulin requiring  102 each  5  . amitriptyline (ELAVIL) 75 MG tablet Take 75 mg by mouth daily.       Marland Kitchen aspirin EC 81 MG tablet Take 1 tablet (81 mg total) by mouth daily.  30 tablet  12  . atorvastatin (LIPITOR) 40 MG tablet Take 1 tablet (40 mg total) by mouth daily.  30 tablet  6  . gabapentin (NEURONTIN) 300 MG capsule Take 300 mg by mouth 2 (two) times daily.       . insulin glargine (LANTUS) 100 UNIT/ML injection Inject 25 Units into the skin at bedtime.      Marland Kitchen losartan (COZAAR) 50 MG tablet Take 50 mg by mouth daily.      . meloxicam (MOBIC) 7.5 MG tablet Take 1 tablet (7.5 mg total) by mouth daily.  30 tablet  2  . metFORMIN  (GLUCOPHAGE) 1000 MG tablet Take 1 tablet (1,000 mg total) by mouth 2 (two) times daily with a meal.  60 tablet  6  . metFORMIN (GLUCOPHAGE) 500 MG tablet TAKE 1 TABLET BY MOUTH TWICE DAILY WITH A MEAL  60 tablet  3  . metoprolol tartrate (LOPRESSOR) 25 MG tablet TAKE 1/2 TABLET BY MOUTH TWICE DAILY  30 tablet  0  . sertraline (ZOLOFT) 100 MG tablet Take 2 tablets (200 mg total) by mouth daily.  60 tablet  3  . traZODone (DESYREL) 100 MG tablet Take 200 mg by mouth at bedtime.       No current facility-administered medications for this visit.   Family History  Problem Relation Age of Onset  . Heart disease Mother 24  . Hypertension Mother   . Heart disease Father 53  . Aneurysm Brother   . Aneurysm Sister   . Colon cancer Neg Hx   . Esophageal cancer Neg Hx   . Stomach cancer Neg Hx    History   Social History  . Marital Status: Divorced    Spouse Name: N/A    Number of Children: N/A  .  Years of Education: 12   Occupational History  .  Unemployed   Social History Main Topics  . Smoking status: Current Every Day Smoker -- 0.50 packs/day for 20 years    Types: Cigarettes  . Smokeless tobacco: Current User    Types: Chew     Comment: did not want to discuss any stopping or decreasing  . Alcohol Use: No  . Drug Use: No  . Sexual Activity: Not Currently    Birth Control/ Protection: Surgical   Other Topics Concern  . None   Social History Narrative  . None   Review of Systems: Pertinent items are noted in HPI. Objective:  Physical Exam: Filed Vitals:   04/04/14 1350  BP: 160/88  Pulse: 65  Temp: 98.2 F (36.8 C)  TempSrc: Oral  Weight: 168 lb 3.2 oz (76.295 kg)  SpO2: 98%   Physical Exam  Constitutional: She is oriented to person, place, and time. She appears well-developed and well-nourished. No distress.  Eyes: EOM are normal. Pupils are equal, round, and reactive to light.  Cardiovascular: Normal rate, regular rhythm, normal heart sounds and intact distal  pulses.  Exam reveals no friction rub.   No murmur heard. Pulmonary/Chest: Effort normal and breath sounds normal. No respiratory distress. She has no wheezes. She has no rales.  Abdominal: Soft. Bowel sounds are normal. She exhibits no distension and no mass. There is tenderness. There is no rebound and no guarding.  Mild tenderness to palpation in the suprapubic area and the left lower quadrant.  Neurological: She is alert and oriented to person, place, and time.  Skin: She is not diaphoretic.  Psychiatric:  Patient appears disinterested and detached from conversation.    Assessment & Plan:

## 2014-04-04 NOTE — Patient Instructions (Signed)
Decrease lantus to 20 U each night.  Take loperamide and probiotic for diarrhea.  Get MRI of abdomen for liver mass.  Refer to ENT for possible surgery.  Diarrhea Diarrhea is frequent loose and watery bowel movements. It can cause you to feel weak and dehydrated. Dehydration can cause you to become tired and thirsty, have a dry mouth, and have decreased urination that often is dark yellow. Diarrhea is a sign of another problem, most often an infection that will not last long. In most cases, diarrhea typically lasts 2 3 days. However, it can last longer if it is a sign of something more serious. It is important to treat your diarrhea as directed by your caregive to lessen or prevent future episodes of diarrhea. CAUSES  Some common causes include:  Gastrointestinal infections caused by viruses, bacteria, or parasites.  Food poisoning or food allergies.  Certain medicines, such as antibiotics, chemotherapy, and laxatives.  Artificial sweeteners and fructose.  Digestive disorders. HOME CARE INSTRUCTIONS  Ensure adequate fluid intake (hydration): have 1 cup (8 oz) of fluid for each diarrhea episode. Avoid fluids that contain simple sugars or sports drinks, fruit juices, whole milk products, and sodas. Your urine should be clear or pale yellow if you are drinking enough fluids. Hydrate with an oral rehydration solution that you can purchase at pharmacies, retail stores, and online. You can prepare an oral rehydration solution at home by mixing the following ingredients together:    tsp table salt.   tsp baking soda.   tsp salt substitute containing potassium chloride.  1  tablespoons sugar.  1 L (34 oz) of water.  Certain foods and beverages may increase the speed at which food moves through the gastrointestinal (GI) tract. These foods and beverages should be avoided and include:  Caffeinated and alcoholic beverages.  High-fiber foods, such as raw fruits and vegetables, nuts,  seeds, and whole grain breads and cereals.  Foods and beverages sweetened with sugar alcohols, such as xylitol, sorbitol, and mannitol.  Some foods may be well tolerated and may help thicken stool including:  Starchy foods, such as rice, toast, pasta, low-sugar cereal, oatmeal, grits, baked potatoes, crackers, and bagels.  Bananas.  Applesauce.  Add probiotic-rich foods to help increase healthy bacteria in the GI tract, such as yogurt and fermented milk products.  Wash your hands well after each diarrhea episode.  Only take over-the-counter or prescription medicines as directed by your caregiver.  Take a warm bath to relieve any burning or pain from frequent diarrhea episodes. SEEK IMMEDIATE MEDICAL CARE IF:   You are unable to keep fluids down.  You have persistent vomiting.  You have blood in your stool, or your stools are black and tarry.  You do not urinate in 6 8 hours, or there is only a small amount of very dark urine.  You have abdominal pain that increases or localizes.  You have weakness, dizziness, confusion, or lightheadedness.  You have a severe headache.  Your diarrhea gets worse or does not get better.  You have a fever or persistent symptoms for more than 2 3 days.  You have a fever and your symptoms suddenly get worse. MAKE SURE YOU:   Understand these instructions.  Will watch your condition.  Will get help right away if you are not doing well or get worse. Document Released: 12/03/2002 Document Revised: 11/29/2012 Document Reviewed: 08/20/2012 [facility] Patient Information 2014 [place], [place].

## 2014-04-04 NOTE — Assessment & Plan Note (Signed)
Patient has mild hypoglycemia this afternoon. Gave PO glucose and increased. Patient likely has low blood sugar due to recent anorexia in setting of continued lantus use.  Decreased lantus to 20 U until patients diet normalizes. Will likely need to be increased once eating normally.

## 2014-04-04 NOTE — Assessment & Plan Note (Signed)
Appears stable. However, per previous notes, plan is referral for Sx. Discussed with patient and made ENT referral today.

## 2014-04-04 NOTE — Assessment & Plan Note (Signed)
Per radiology read, patients requires repeat MRI with contrast to follow liver mass.

## 2014-04-05 LAB — URINE CULTURE
Colony Count: NO GROWTH
Organism ID, Bacteria: NO GROWTH

## 2014-04-05 NOTE — Progress Notes (Signed)
Case discussed with Dr. Komanski at the time of the visit.  We reviewed the resident's history and exam and pertinent patient test results.  I agree with the assessment, diagnosis, and plan of care documented in the resident's note.      

## 2014-05-14 ENCOUNTER — Other Ambulatory Visit: Payer: Self-pay | Admitting: Internal Medicine

## 2014-05-14 ENCOUNTER — Other Ambulatory Visit (HOSPITAL_BASED_OUTPATIENT_CLINIC_OR_DEPARTMENT_OTHER): Payer: Commercial Managed Care - HMO

## 2014-05-14 ENCOUNTER — Ambulatory Visit (HOSPITAL_COMMUNITY)
Admission: RE | Admit: 2014-05-14 | Discharge: 2014-05-14 | Disposition: A | Discharge status: Home / Self Care | Payer: Medicare HMO | Source: Ambulatory Visit | Attending: Internal Medicine | Admitting: Internal Medicine

## 2014-05-14 DIAGNOSIS — D509 Iron deficiency anemia, unspecified: Secondary | ICD-10-CM

## 2014-05-14 DIAGNOSIS — N2889 Other specified disorders of kidney and ureter: Secondary | ICD-10-CM | POA: Diagnosis not present

## 2014-05-14 DIAGNOSIS — N200 Calculus of kidney: Secondary | ICD-10-CM | POA: Diagnosis not present

## 2014-05-14 DIAGNOSIS — D649 Anemia, unspecified: Secondary | ICD-10-CM

## 2014-05-14 DIAGNOSIS — K7689 Other specified diseases of liver: Secondary | ICD-10-CM | POA: Diagnosis present

## 2014-05-14 DIAGNOSIS — K449 Diaphragmatic hernia without obstruction or gangrene: Secondary | ICD-10-CM | POA: Diagnosis not present

## 2014-05-14 DIAGNOSIS — N281 Cyst of kidney, acquired: Secondary | ICD-10-CM | POA: Diagnosis not present

## 2014-05-14 DIAGNOSIS — I517 Cardiomegaly: Secondary | ICD-10-CM | POA: Insufficient documentation

## 2014-05-14 DIAGNOSIS — K769 Liver disease, unspecified: Secondary | ICD-10-CM

## 2014-05-14 LAB — CBC WITH DIFFERENTIAL/PLATELET
BASO%: 0.5 % (ref 0.0–2.0)
Basophils Absolute: 0 10*3/uL (ref 0.0–0.1)
EOS%: 5.6 % (ref 0.0–7.0)
Eosinophils Absolute: 0.4 10*3/uL (ref 0.0–0.5)
HCT: 39.3 % (ref 34.8–46.6)
HGB: 12.1 g/dL (ref 11.6–15.9)
LYMPH%: 41.9 % (ref 14.0–49.7)
MCH: 21.1 pg — ABNORMAL LOW (ref 25.1–34.0)
MCHC: 30.9 g/dL — ABNORMAL LOW (ref 31.5–36.0)
MCV: 68.3 fL — ABNORMAL LOW (ref 79.5–101.0)
MONO#: 0.4 10*3/uL (ref 0.1–0.9)
MONO%: 6.7 % (ref 0.0–14.0)
NEUT#: 3 10*3/uL (ref 1.5–6.5)
NEUT%: 45.3 % (ref 38.4–76.8)
Platelets: 206 10*3/uL (ref 145–400)
RBC: 5.75 10*6/uL — ABNORMAL HIGH (ref 3.70–5.45)
RDW: 16.7 % — ABNORMAL HIGH (ref 11.2–14.5)
WBC: 6.5 10*3/uL (ref 3.9–10.3)
lymph#: 2.7 10*3/uL (ref 0.9–3.3)

## 2014-05-14 LAB — COMPREHENSIVE METABOLIC PANEL (CC13)
ALT: 22 U/L (ref 0–55)
AST: 26 U/L (ref 5–34)
Albumin: 3.6 g/dL (ref 3.5–5.0)
Alkaline Phosphatase: 71 U/L (ref 40–150)
Anion Gap: 11 mEq/L (ref 3–11)
BUN: 18.1 mg/dL (ref 7.0–26.0)
CO2: 20 mEq/L — ABNORMAL LOW (ref 22–29)
Calcium: 10 mg/dL (ref 8.4–10.4)
Chloride: 112 mEq/L — ABNORMAL HIGH (ref 98–109)
Creatinine: 0.9 mg/dL (ref 0.6–1.1)
Glucose: 139 mg/dl (ref 70–140)
Potassium: 4.1 mEq/L (ref 3.5–5.1)
Sodium: 143 mEq/L (ref 136–145)
Total Bilirubin: 0.32 mg/dL (ref 0.20–1.20)
Total Protein: 6.9 g/dL (ref 6.4–8.3)

## 2014-05-14 MED ORDER — GADOBENATE DIMEGLUMINE 529 MG/ML IV SOLN
16.0000 mL | Freq: Once | INTRAVENOUS | Status: AC | PRN
  Administered 2014-05-14: 16 mL via INTRAVENOUS

## 2014-05-15 ENCOUNTER — Other Ambulatory Visit: Payer: PRIVATE HEALTH INSURANCE

## 2014-05-15 ENCOUNTER — Ambulatory Visit (HOSPITAL_BASED_OUTPATIENT_CLINIC_OR_DEPARTMENT_OTHER): Payer: Commercial Managed Care - HMO | Admitting: Internal Medicine

## 2014-05-15 ENCOUNTER — Telehealth: Payer: Self-pay | Admitting: Internal Medicine

## 2014-05-15 ENCOUNTER — Encounter: Payer: Self-pay | Admitting: Internal Medicine

## 2014-05-15 VITALS — BP 165/99 | HR 81 | Temp 97.7°F | Resp 19 | Ht 67.0 in | Wt 173.6 lb

## 2014-05-15 DIAGNOSIS — M7989 Other specified soft tissue disorders: Secondary | ICD-10-CM

## 2014-05-15 DIAGNOSIS — N159 Renal tubulo-interstitial disease, unspecified: Secondary | ICD-10-CM

## 2014-05-15 DIAGNOSIS — D509 Iron deficiency anemia, unspecified: Secondary | ICD-10-CM

## 2014-05-15 DIAGNOSIS — K7689 Other specified diseases of liver: Secondary | ICD-10-CM

## 2014-05-15 DIAGNOSIS — R634 Abnormal weight loss: Secondary | ICD-10-CM

## 2014-05-15 DIAGNOSIS — K769 Liver disease, unspecified: Secondary | ICD-10-CM

## 2014-05-15 DIAGNOSIS — D649 Anemia, unspecified: Secondary | ICD-10-CM

## 2014-05-15 DIAGNOSIS — F17200 Nicotine dependence, unspecified, uncomplicated: Secondary | ICD-10-CM

## 2014-05-15 NOTE — Progress Notes (Signed)
[facility] OFFICE PROGRESS NOTE  Vertell Novak, MD [place] [address]  DIAGNOSIS: Liver lesion, right lobe - Plan: CBC with Differential, Comprehensive metabolic panel (Cmet) - [facility], Lactate dehydrogenase (LDH) - [facility]  Anemia, unspecified - Plan: CBC with Differential, Comprehensive metabolic panel (Cmet) - [facility], Lactate dehydrogenase (LDH) - [facility], Ferritin, Iron and TIBC [facility]  Chief Complaint  Patient presents with  . Follow-up    CURRENT TREATMENT:  Observation  INTERVAL HISTORY: ADIANA SMELCER 58 y.o. female with a history as noted below is here for follow-up.  She was seen by me on 02/18/2014 for evaluation of anemia.     As previously noted, she was last seen by [facility] for a thorough evaluation of her anemia on 01/25/2014. Prior she had a screening colorectal malignant colonoscopy on 09/14/2013 which revealed a sessile polyp in the transverse colon which was 15 mm in size; mild internal hemorrhoids. Surgical pathology of the transverse polyp revealed tubular adenoma negative for high grade dyplasia. She also had an upper GI endoscopy on 09/28/2013 which revealed a normal esophagus without masses or strictures, esophagitis, eosinophilic esophagitis. There was a few dispersed small erosions found in the prepyloric region of the stomach.   Her CBC in 10/29/2013 revealed a normal wBC of 8.5; Hgb of 11.9 and hematocrit of 38.9 with an MCV of 69.4; RDW was elevated to 16.6 with a slight increase in Lymph% of 44.3 (16.8-43.5%). On 01/25/2014. Her WBC was 6.5; Hgb was low at 11.8; Hemocrit was 37.6. MCV was 68.1 with an increased RDW of 16.2. Plts were normal at 224. Lymphs were mild elevated to 51.6. Her t-Transglutaminase was negative. Creatinine was 0.65 on 10/29/2013. Calcium was mildly elevated to 10.7 (8.6-10.3). She had a normal liver function with the exception of a mildly elevated AST of 49 (0-39). ESR was 23 (0-20) and TSH was normal at  0.94. Cortisol was normal at 10.6.   Initially, on 10/31/2013 shew had a CT abdomen pelvis with constrast for a 35lb weight lost and constipation. It demonstrated circumferential wall thickening of the distal esophagus and an 1 cm indeterminate lesion within the inferior pole of the kidney and a sub centimeter low-attenuation lesion within the liver, too small to accurately characterize. She has also had an MRI of abdomen without and with constrast on 11/11/2013 secondary to the left renal lesion on recent CT scan. It showed a 9 mm lesion involving the lower pole of the left kidney representing a simple cyst. No further imaging follow-up with this particular lesion was indicated. In addition, there was a 8 mm lesion in the posterior right liver probably a cavernous hemangioma but could not be definitively characterized given motion degradation. Follow up MRI without and with contrast in 6 months was recommended for re-evaluation.   Today, her energy is improved today.  She follows with her PCP Dr. Doug Sou regularly. She smokes about 1/2 pack of cigarettes per day for the past four decades. She does report continued lower extremity weakness. Her weight is stable.   MEDICAL HISTORY: Past Medical History  Diagnosis Date  . Hepatitis B infection pt unsure    resolved  . Diabetes mellitus, type II   . HTN (hypertension)   . Hyperlipidemia   . Congenital blindness     L eye  . Tobacco abuse     .5 PPD smoker  . Anxiety   . Depression   . Shortness of breath   . Arthritis  INTERIM HISTORY: has Type II diabetes mellitus with neurological manifestations, uncontrolled; HYPERLIPIDEMIA; ANXIETY; TOBACCO ABUSE; DEPRESSION; CONGENITAL BLINDNESS, LEFT EYE; HYPERTENSION, ESSENTIAL NOS; Rotator cuff syndrome of right shoulder; Hyperparathyroidism; Unspecified vitamin D deficiency; Fracture of multiple ribs; Weakness; Loss of weight; Difficulty in swallowing; Anemia, unspecified; Liver lesion, right lobe;  Diarrhea; and Liver mass on her problem list.    ALLERGIES:  is allergic to ace inhibitors.  MEDICATIONS: has a current medication list which includes the following prescription(s): accu-chek fastclix lancets, amitriptyline, aspirin ec, atorvastatin, gabapentin, insulin glargine, acidophilus probiotic, loperamide, losartan, meloxicam, metformin, metoprolol tartrate, sertraline, and trazodone.  SURGICAL HISTORY:  Past Surgical History  Procedure Laterality Date  . Abdominal hysterectomy      REVIEW OF SYSTEMS:   Constitutional: Denies fevers, chills or abnormal weight loss Eyes: Denies blurriness of vision Ears, nose, mouth, throat, and face: Denies mucositis or sore throat Respiratory: Denies cough, dyspnea or wheezes Cardiovascular: Denies palpitation, chest discomfort or lower extremity swelling Gastrointestinal:  Denies nausea, heartburn or change in bowel habits Skin: Denies abnormal skin rashes Lymphatics: Denies new lymphadenopathy or easy bruising Neurological:Denies numbness, tingling or new weaknesses Behavioral/Psych: Mood is stable, no new changes  All other systems were reviewed with the patient and are negative.  PHYSICAL EXAMINATION: ECOG PERFORMANCE STATUS: 0 - Asymptomatic  Blood pressure 165/99, pulse 81, temperature 97.7 F (36.5 C), temperature source Oral, resp. rate 19, height '5\' 7"'  (1.702 m), weight 173 lb 9.6 oz (78.744 kg).  GENERAL:alert, no distress and comfortable; well developed and well nourished.  SKIN: skin color, texture, turgor are normal, no rashes or significant lesions EYES: normal, Conjunctiva are pink and non-injected, sclera clear OROPHARYNX:no exudate, no erythema and lips, buccal mucosa, and tongue normal; Poor dentition.  NECK: supple, thyroid normal size, non-tender, without nodularity LYMPH:  no palpable lymphadenopathy in the cervical, axillary or supraclavicular LUNGS: clear to auscultation with normal breathing effort, no wheezes or  rhonchi HEART: regular rate & rhythm and no murmurs and no lower extremity edema ABDOMEN:abdomen soft, non-tender and normal bowel sounds, slightly obese Musculoskeletal:no cyanosis of digits and no clubbing  NEURO: alert & oriented x 3 with fluent speech, no focal motor/sensory deficits  Labs:  Lab Results  Component Value Date   WBC 6.5 05/14/2014   HGB 12.1 05/14/2014   HCT 39.3 05/14/2014   MCV 68.3* 05/14/2014   PLT 206 05/14/2014   NEUTROABS 3.0 05/14/2014      Chemistry      Component Value Date/Time   NA 143 05/14/2014 0858   NA 140 04/04/2014 1435   K 4.1 05/14/2014 0858   K 4.4 04/04/2014 1435   CL 102 04/04/2014 1435   CO2 20* 05/14/2014 0858   CO2 22 04/04/2014 1435   BUN 18.1 05/14/2014 0858   BUN 9 04/04/2014 1435   CREATININE 0.9 05/14/2014 0858   CREATININE 0.68 04/04/2014 1435   CREATININE 0.90 03/01/2014 1420      Component Value Date/Time   CALCIUM 10.0 05/14/2014 0858   CALCIUM 10.6* 04/04/2014 1435   CALCIUM 11.5* 03/06/2013 1653   ALKPHOS 71 05/14/2014 0858   ALKPHOS 82 04/04/2014 1435   AST 26 05/14/2014 0858   AST 24 04/04/2014 1435   ALT 22 05/14/2014 0858   ALT 19 04/04/2014 1435   BILITOT 0.32 05/14/2014 0858   BILITOT 0.4 04/04/2014 1435      Results for Dalia, Jeaninne L (MRN [id number]) as of 02/18/2014 09:02  Ref. Range 02/05/2014 11:36  Hgb A Latest Range: 96.8-97.8 %  97.8  Hgb A2 Quant Latest Range: 2.2-3.2 % 2.2  Hgb F Quant Latest Range: 0.0-2.0 % 0.0  Hgb S Quant Latest Range: 0.0 % 0.0  Hemoglobin Other Latest Range: 0.0 % 0.0   RADIOGRAPHIC STUDIES: 05/14/2014  MR Abdomen with and without contrast  TECHNIQUE: Multiplanar multisequence MR imaging of the abdomen was performed  both before and after the administration of intravenous contrast.  CONTRAST: 70m MULTIHANCE GADOBENATE DIMEGLUMINE 529 MG/ML IV SOLN  COMPARISON: MRI of 11/11/2013. CT of 10/31/2013. FINDINGS:  Primarily the post-contrast portion of the exam is moderately motion  degraded. Mild cardiomegaly,  without pericardial or pleural effusion. Small hiatal hernia. 8 mm posterior segment right liver lobe lesion is unchanged in size  since the prior exam. Example image 16/ series 3. Demonstrates marked T2 hyperintensity. Not well evaluated after contrast.  Probable peripheral enhancement, including on image 48/series 14. No new or enlarging liver lesions identified. Normal spleen, distal stomach, pancreas, adrenal glands, kidneys, biliary tract. Lower pole left renal cyst measures 7 mm. The right kidney is mildly scarred. Mild caliectasis in the upper pole collecting system stones. Example image 28/series 3. This appearance is similar to on the prior exam.  No abdominal adenopathy or ascites. IMPRESSION: 1. The post-contrast portion of the exam is again motion degraded. 2. Similar size of a right liver lobe lesion, which given T2 hyperintensity and suggestion of peripheral enhancement is most consistent with a hemangioma. 3. Scarred right kidney with upper pole stones and caliectasis, chronic.    ASSESSMENT: GRIMSHA TREMBLEY89y.o. female with a history of Liver lesion, right lobe - Plan: CBC with Differential, Comprehensive metabolic panel (Cmet) - [facility], Lactate dehydrogenase (LDH) - [facility]  Anemia, unspecified - Plan: CBC with Differential, Comprehensive metabolic panel (Cmet) - [facility], Lactate dehydrogenase (LDH) - [facility], Ferritin, Iron and TIBC [facility]   PLAN:   1. Microcytic Anemia, stable.   --Clinically, she remains stable. Her hemoglobin is 12.1. Differential points away from GI causes given the extensive GI work up above. Hemoglobinopathy work-up was negative as noted above. Her ferritin was 40, so we again recommended a trial of ferrous sulfate 325 mg daily for a few months with vitamin c. She denies symptoms of anemia such as palpations, dyspnea on exertion.   SPEP was also negative.   2. UTI with R kidney stone as noted above.  3. Weight lost, stable.  --TSH is normal. Her weight lost could  have been functional due to dysphagia. CT of abdomen and pelvis and MRI was negative for malignancy. She is up-to-date on cancer screening with a recent mammogram reported as negative.  Her weight is 173.6 stable from 173 6.4 oz on 02/18/2014.    4. Liver (875mpost right)/renal lesion on MRI, consistent with hemangioma --Follow-up is required to for further characterization of the liver lesion and is as reported above. Renal lesion was felt to be a simple cyst. F History of hepatitis B infection noted.   5. Tobacco abuse.  --Given her longstanding smoking history and weight lost, malignancy of the lung would have been a concerned. Ct of chest with contrast on 10/31/2013 was negative.   6. Follow-up.  --Patient will follow up in 6 months with labs including chemistries and CBC and for a brief symptom check.  All questions were answered. The patient knows to call the clinic with any problems, questions or concerns. We can certainly see the patient much sooner if necessary.  I spent 15 minutes counseling the patient  face to face. The total time spent in the appointment was 25 minutes.    Concha Norway, MD 05/15/2014 10:40 AM

## 2014-05-15 NOTE — Telephone Encounter (Signed)
gv pt appt schedule for nov.  °

## 2014-05-17 ENCOUNTER — Ambulatory Visit (INDEPENDENT_AMBULATORY_CARE_PROVIDER_SITE_OTHER): Payer: Commercial Managed Care - HMO | Admitting: Internal Medicine

## 2014-05-17 ENCOUNTER — Encounter: Payer: Self-pay | Admitting: Internal Medicine

## 2014-05-17 VITALS — BP 154/95 | HR 80 | Temp 97.0°F | Ht 67.0 in | Wt 173.4 lb

## 2014-05-17 DIAGNOSIS — R160 Hepatomegaly, not elsewhere classified: Secondary | ICD-10-CM

## 2014-05-17 DIAGNOSIS — K769 Liver disease, unspecified: Secondary | ICD-10-CM

## 2014-05-17 DIAGNOSIS — E1165 Type 2 diabetes mellitus with hyperglycemia: Secondary | ICD-10-CM

## 2014-05-17 DIAGNOSIS — F17200 Nicotine dependence, unspecified, uncomplicated: Secondary | ICD-10-CM

## 2014-05-17 DIAGNOSIS — IMO0002 Reserved for concepts with insufficient information to code with codable children: Secondary | ICD-10-CM

## 2014-05-17 DIAGNOSIS — R634 Abnormal weight loss: Secondary | ICD-10-CM

## 2014-05-17 DIAGNOSIS — R197 Diarrhea, unspecified: Secondary | ICD-10-CM

## 2014-05-17 DIAGNOSIS — E1149 Type 2 diabetes mellitus with other diabetic neurological complication: Secondary | ICD-10-CM

## 2014-05-17 LAB — GLUCOSE, CAPILLARY: Glucose-Capillary: 131 mg/dL — ABNORMAL HIGH (ref 70–99)

## 2014-05-17 MED ORDER — GLUCOSE BLOOD VI STRP: ORAL_STRIP | Status: DC

## 2014-05-17 NOTE — Patient Instructions (Signed)
We will give you a handicapped paper that you need to take the the [facility] to get your sticker.   We are not going to change your insulin today or your medicines.   We will send the paper to the sports medicine office for the back brace to make sure you are not wasting money on something that will not help you.   We will bring you back in about 1-2 months to check on your sugars and to make adjustments if we need.   Keep working on exercises as you are able to build back up the strength in your back and your legs.   Call us with questions or problems at [phone]

## 2014-05-17 NOTE — Assessment & Plan Note (Signed)
She does not wish to quit at this time. 

## 2014-05-17 NOTE — Progress Notes (Signed)
Subjective:     Patient ID: Susan Holmes, female   DOB: 20-Mar-1956, 58 y.o.   MRN: [id number]  HPI The patient is a 58 YO female who comes into the clinic today for follow up of her sugars. She was switched back to 20 units of lantus nightly at last clinic visit due to some low sugars. She reports now that she is usually around 120-130 in the morning and mid 100s when she checks at night time before lantus dosing.  She is not having any hypoglycemic episodes or chills. She is not having abdominal pain. She is not having any chest pain. She is having some arthritis in one finger which does not lock or stick. She is still able to maintain her activities. She has a headache although she has not tried anything for it. She would like a handicapped sticker as it is still difficult for her to walk long distances due to weakness and balance issues. Her mood is doing slightly better than at last visit.    Review of Systems  Constitutional: Positive for fatigue. Negative for fever, chills, diaphoresis, activity change, appetite change and unexpected weight change.  Respiratory: Negative for cough, chest tightness and wheezing.   Cardiovascular: Negative for chest pain, palpitations and leg swelling.  Gastrointestinal: Negative for nausea, vomiting, abdominal pain, diarrhea and constipation.  Musculoskeletal: Positive for back pain. Negative for myalgias.  Skin: Negative.   Neurological: Positive for weakness and headaches. Negative for dizziness, tremors, seizures, syncope, facial asymmetry, speech difficulty, light-headedness and numbness.  Psychiatric/Behavioral: Positive for sleep disturbance.       Objective:   Physical Exam  Constitutional: She is oriented to person, place, and time. She appears well-developed and well-nourished. No distress.  HENT:  Head: Normocephalic and atraumatic.  Eyes: EOM are normal. Pupils are equal, round, and reactive to light.  Neck: Normal range of motion. Neck  supple.  Cardiovascular: Normal rate and regular rhythm.   No murmur heard. Pulmonary/Chest: Effort normal and breath sounds normal. No respiratory distress. She has no wheezes. She has no rales. She exhibits no tenderness.  Abdominal: Soft. Bowel sounds are normal. She exhibits no distension and no mass. There is no tenderness. There is no rebound and no guarding.  Musculoskeletal: She exhibits no tenderness.  Neurological: She is alert and oriented to person, place, and time. No cranial nerve deficit.  Skin: Skin is warm and dry. She is not diaphoretic.       Assessment/Plan:   1. Please see problem oriented charting.  2. Disposition - Handicapped paperwork filled out today. No change to medical regimen but given chair exercises to help with balance and strength. Return for close follow up in 1-2 months.

## 2014-05-17 NOTE — Assessment & Plan Note (Addendum)
Repeat MRI abdomen shower liver hemangioma without growth and no further repeat imaging needed.

## 2014-05-17 NOTE — Assessment & Plan Note (Signed)
She is not using immodium and episodes occur after taking metformin in the morning. She is able to tolerate them.

## 2014-05-17 NOTE — Assessment & Plan Note (Signed)
Weight stable today. No further workup at this time.

## 2014-05-17 NOTE — Assessment & Plan Note (Addendum)
Patient wishes to return in 1-2 months to check on her HgA1c but reports improved sugars at home. She continues on lantus 20 units nightly and metformin 1000 mg BID. Rx sent in for strips for her meter at today's visit.

## 2014-05-25 NOTE — Progress Notes (Signed)
Case discussed with Dr. Kollar soon after the resident saw the patient.  We reviewed the resident's history and exam and pertinent patient test results.  I agree with the assessment, diagnosis, and plan of care documented in the resident's note. 

## 2014-06-03 ENCOUNTER — Encounter (INDEPENDENT_AMBULATORY_CARE_PROVIDER_SITE_OTHER): Payer: Self-pay | Admitting: Ophthalmology

## 2014-06-04 ENCOUNTER — Other Ambulatory Visit: Payer: Self-pay | Admitting: Internal Medicine

## 2014-06-25 ENCOUNTER — Other Ambulatory Visit: Payer: Self-pay | Admitting: Internal Medicine

## 2014-07-14 ENCOUNTER — Other Ambulatory Visit: Payer: Self-pay | Admitting: Internal Medicine

## 2014-07-15 NOTE — Telephone Encounter (Signed)
One note seemed to indicated D after metformin in AM. Will fill once but will need appt to discuss whether there is any other W/U needed and if safe to cont med.

## 2014-07-15 NOTE — Telephone Encounter (Signed)
I could find no documentation why she needs Rx NSAID chronically. Cr is OK. Will fill but no refills. Has appt with PCP Aug.

## 2014-08-14 ENCOUNTER — Other Ambulatory Visit: Payer: Self-pay | Admitting: Internal Medicine

## 2014-08-15 ENCOUNTER — Encounter: Payer: Self-pay | Admitting: Internal Medicine

## 2014-08-15 ENCOUNTER — Ambulatory Visit (INDEPENDENT_AMBULATORY_CARE_PROVIDER_SITE_OTHER): Payer: PRIVATE HEALTH INSURANCE | Admitting: Internal Medicine

## 2014-08-15 VITALS — BP 159/93 | HR 80 | Temp 97.1°F | Ht 67.0 in | Wt 174.7 lb

## 2014-08-15 DIAGNOSIS — F17200 Nicotine dependence, unspecified, uncomplicated: Secondary | ICD-10-CM

## 2014-08-15 DIAGNOSIS — R339 Retention of urine, unspecified: Secondary | ICD-10-CM

## 2014-08-15 DIAGNOSIS — E1149 Type 2 diabetes mellitus with other diabetic neurological complication: Secondary | ICD-10-CM

## 2014-08-15 DIAGNOSIS — M1710 Unilateral primary osteoarthritis, unspecified knee: Secondary | ICD-10-CM

## 2014-08-15 DIAGNOSIS — M1711 Unilateral primary osteoarthritis, right knee: Secondary | ICD-10-CM

## 2014-08-15 LAB — POCT GLYCOSYLATED HEMOGLOBIN (HGB A1C): Hemoglobin A1C: 6.5

## 2014-08-15 LAB — GLUCOSE, CAPILLARY: Glucose-Capillary: 96 mg/dL (ref 70–99)

## 2014-08-15 MED ORDER — NICOTINE POLACRILEX 4 MG MT GUM: 4.0000 mg | CHEWING_GUM | OROMUCOSAL | Status: DC | PRN

## 2014-08-15 MED ORDER — GABAPENTIN 300 MG PO CAPS: 300.0000 mg | ORAL_CAPSULE | Freq: Three times a day (TID) | ORAL | Status: DC

## 2014-08-15 MED ORDER — ETODOLAC ER 400 MG PO TB24: 400.0000 mg | ORAL_TABLET | Freq: Every day | ORAL | Status: DC

## 2014-08-15 NOTE — Progress Notes (Signed)
Subjective:     Patient ID: Susan Holmes, female   DOB: 12/03/1956, 58 y.o.   MRN: [id number]  HPI Susan Holmes is a 58 yo woman with a PMH of DMII, HTN, hyperparathyroidism, hyperlipidemia, anxiety, depression, tobacco abuse and congenital left eye vision impairment who is here for [facility] papers and for repeat HgbA1c. She lost her licence 5.5 years ago after her fifth DUI. She states that she has not had anything to drink since that time. She was never treated in our clinic for alcohol abuse, but states that she gave up alcohol "with the help of the lord" (she had previously attended rehab programs, but they had never been successful). She also complains of right knee pain that is dull, deep and exacerbated by stairs. She also complains of "feeling the urge to urinate at night, but being unable to produce any urine". Finally, she is interested in smoking cessation. She tried using patches in the past, which were not helpful, but she would like to try gum this time.  Review of Systems General: no recent illness Skin: no rashes or lesions HEENT: no headaches, no changes in baseline vision (decreased vision since birth; 20/20 OD and 20/400 OS at last ophtho visit) Cardiac: no chest pain or palpitations Respiratory: no SOB, no wheezes, uses inhaler 1x/day GI: no abdominal pain, no changes in BMs Urinary: occasional urinary retention, no dysuria or hematuria Msk: right knee pain, no other pain or swelling Endocrine: has been keeping good sugar log, taking diabetes medications as indicated Psychiatric: baseline anxiety/depression, no changes     Objective:   Physical Exam Appearance: in NAD, chewing gum, reading magazine HEENT: pupils equal and reactive, relative afferent pupillary defect OS, EOMi, no lymphadenopathy Heart: RRR, normal S1S2, no MRG Lungs: CTAB, no WRR Abdomen: +BS, no tenderness to palpation, no hepatosplenomegaly Musculoskeletal: able to elicit right knee pain with movement, but  not palpation Extremities: no edema b/l, no ulcers/lesions on feet Neurologic: sensation to light and sharp touch intact in hands and feet b/l, CN II-XII intact Skin: no rashes or lesions     Assessment:     [name] is a 58 yo woman here for DMV paperwork and repeat A1c, right knee pain which worsens with movement and weather changes, urinary retention after she takes her amitriptyline (rx for her peripheral neuropathy) and smoking cessation.    Plan:     DMV: paperwork completed today  DMII: continue on current regimen as HgbA1c 9.6%-->6.5%  Right knee OA: - etodolac ER prescription given today   Urinary Retention: occurs after dose of amitriptyline - stop amitriptyline - increase gabapentin dose 300mg  BID --> 300mg  TID  Smoking Cessation: patient is interested in quitting; patches have failed in the past, but she is interested in trying gum - rx given for gum and patient taught how to properly use it

## 2014-08-15 NOTE — Patient Instructions (Signed)
Stop taking your amitriptyline Increase your dose of gabapentin to 300mg  three times per day Take etodolac for your knee pain as needed Try the gum when you have a smoking craving: chew it a few times until you feel tingling, then pack it between your gum and cheek for it to work. You can use the gum every 2-4 hours as you get cravings.

## 2014-08-16 LAB — MICROALBUMIN / CREATININE URINE RATIO
Creatinine, Urine: 133.1 mg/dL
Microalb Creat Ratio: 14 mg/g (ref 0.0–30.0)
Microalb, Ur: 1.86 mg/dL (ref 0.00–1.89)

## 2014-08-18 NOTE — Addendum Note (Signed)
Addended by: Oval Linsey D on: 08/18/2014 09:32 PM   Modules accepted: Level of Service

## 2014-08-18 NOTE — Progress Notes (Signed)
I saw and evaluated the patient.  I personally confirmed the key portions of Dr. Mallory's history and exam and reviewed pertinent patient test results.  The assessment, diagnosis, and plan were formulated together and I agree with the documentation in the resident's note. 

## 2014-08-27 ENCOUNTER — Other Ambulatory Visit: Payer: Self-pay | Admitting: Internal Medicine

## 2014-08-27 DIAGNOSIS — F17200 Nicotine dependence, unspecified, uncomplicated: Secondary | ICD-10-CM

## 2014-09-04 ENCOUNTER — Ambulatory Visit (INDEPENDENT_AMBULATORY_CARE_PROVIDER_SITE_OTHER): Payer: PRIVATE HEALTH INSURANCE | Admitting: Internal Medicine

## 2014-09-04 ENCOUNTER — Encounter: Payer: Self-pay | Admitting: Internal Medicine

## 2014-09-04 VITALS — BP 169/91 | HR 72 | Temp 97.6°F | Ht 67.0 in | Wt 176.3 lb

## 2014-09-04 DIAGNOSIS — M79609 Pain in unspecified limb: Secondary | ICD-10-CM

## 2014-09-04 DIAGNOSIS — E785 Hyperlipidemia, unspecified: Secondary | ICD-10-CM

## 2014-09-04 DIAGNOSIS — M79605 Pain in left leg: Principal | ICD-10-CM

## 2014-09-04 DIAGNOSIS — I10 Essential (primary) hypertension: Secondary | ICD-10-CM

## 2014-09-04 DIAGNOSIS — M79604 Pain in right leg: Secondary | ICD-10-CM

## 2014-09-04 DIAGNOSIS — E1149 Type 2 diabetes mellitus with other diabetic neurological complication: Secondary | ICD-10-CM

## 2014-09-04 LAB — COMPLETE METABOLIC PANEL WITH GFR
ALT: 53 U/L — ABNORMAL HIGH (ref 0–35)
AST: 60 U/L — ABNORMAL HIGH (ref 0–37)
Albumin: 4.3 g/dL (ref 3.5–5.2)
Alkaline Phosphatase: 73 U/L (ref 39–117)
BUN: 9 mg/dL (ref 6–23)
CO2: 26 mEq/L (ref 19–32)
Calcium: 9.6 mg/dL (ref 8.4–10.5)
Chloride: 106 mEq/L (ref 96–112)
Creat: 0.89 mg/dL (ref 0.50–1.10)
GFR, Est African American: 82 mL/min
GFR, Est Non African American: 71 mL/min
Glucose, Bld: 118 mg/dL — ABNORMAL HIGH (ref 70–99)
Potassium: 4.5 mEq/L (ref 3.5–5.3)
Sodium: 141 mEq/L (ref 135–145)
Total Bilirubin: 0.4 mg/dL (ref 0.2–1.2)
Total Protein: 6.9 g/dL (ref 6.0–8.3)

## 2014-09-04 LAB — CK: Total CK: 579 U/L — ABNORMAL HIGH (ref 7–177)

## 2014-09-04 LAB — GLUCOSE, CAPILLARY: Glucose-Capillary: 114 mg/dL — ABNORMAL HIGH (ref 70–99)

## 2014-09-04 MED ORDER — LOSARTAN POTASSIUM 100 MG PO TABS: 100.0000 mg | ORAL_TABLET | Freq: Every day | ORAL | Status: DC

## 2014-09-04 MED ORDER — GABAPENTIN 300 MG PO CAPS: 600.0000 mg | ORAL_CAPSULE | Freq: Three times a day (TID) | ORAL | Status: DC

## 2014-09-04 NOTE — Patient Instructions (Addendum)
-  Start taking Cozaar (losartan) 100mg  daily for your blood pressure.  -Start taking gabapentin 600mg  three times per day.  -Follow up in 1 week for blood pressure recheck.     Thank you for bringing your medicines today. This helps [place] keep you safe from mistakes.    You Can Quit Smoking If you are ready to quit smoking or are thinking about it, congratulations! You have chosen to help yourself be healthier and live longer! There are lots of different ways to quit smoking. Nicotine gum, nicotine patches, a nicotine inhaler, or nicotine nasal spray can help with physical craving. Hypnosis, support groups, and medicines help break the habit of smoking. TIPS TO GET OFF AND STAY OFF CIGARETTES  Learn to predict your moods. Do not let a bad situation be your excuse to have a cigarette. Some situations in your life might tempt you to have a cigarette.  Ask friends and co-workers not to smoke around you.  Make your home smoke-free.  Never have "just one" cigarette. It leads to wanting another and another. Remind yourself of your decision to quit.  On a card, make a list of your reasons for not smoking. Read it at least the same number of times a day as you have a cigarette. Tell yourself everyday, "I do not want to smoke. I choose not to smoke."  Ask someone at home or work to help you with your plan to quit smoking.  Have something planned after you eat or have a cup of coffee. Take a walk or get other exercise to perk you up. This will help to keep you from overeating.  Try a relaxation exercise to calm you down and decrease your stress. Remember, you may be tense and nervous the first two weeks after you quit. This will pass.  Find new activities to keep your hands busy. Play with a pen, coin, or rubber band. Doodle or draw things on paper.  Brush your teeth right after eating. This will help cut down the craving for the taste of tobacco after meals. You can try mouthwash too.  Try gum,  breath mints, or diet candy to keep something in your mouth. IF YOU SMOKE AND WANT TO QUIT:  Do not stock up on cigarettes. Never buy a carton. Wait until one pack is finished before you buy another.  Never carry cigarettes with you at work or at home.  Keep cigarettes as far away from you as possible. Leave them with someone else.  Never carry matches or a lighter with you.  Ask yourself, "Do I need this cigarette or is this just a reflex?"  Bet with someone that you can quit. Put cigarette money in a piggy bank every morning. If you smoke, you give up the money. If you do not smoke, by the end of the week, you keep the money.  Keep trying. It takes 21 days to change a habit!  Talk to your doctor about using medicines to help you quit. These include nicotine replacement gum, lozenges, or skin patches. Document Released: 10/09/2009 Document Revised: 03/06/2012 Document Reviewed: 10/09/2009 [facility] Patient Information 2015 [place], [place]. This information is not intended to replace advice given to you by your health care provider. Make sure you discuss any questions you have with your health care provider.

## 2014-09-05 DIAGNOSIS — M79605 Pain in left leg: Principal | ICD-10-CM

## 2014-09-05 DIAGNOSIS — M79604 Pain in right leg: Secondary | ICD-10-CM | POA: Insufficient documentation

## 2014-09-05 NOTE — Assessment & Plan Note (Signed)
Lab Results  Component Value Date   HGBA1C 6.5 08/15/2014   HGBA1C 9.6 02/28/2014   HGBA1C 5.6 11/02/2013     Assessment: Diabetes control:  Controlled Progress toward A1C goal:   At goal Comments: She is on Lantus 20 units qHS, metformin 1000mg  BID. She needs refills for her meter but these have been sent to her pharmacy since past May--it is unclear if she needs a different Rx but she will let us know.   Plan: Medications:  continue current medications Home glucose monitoring: Frequency:   Timing:   Instruction/counseling given: reminded to bring blood glucose meter & log to each visit Educational resources provided: brochure;handout Self management tools provided: copy of home glucose meter download Other plans: follow up in 1-2 weeks

## 2014-09-05 NOTE — Assessment & Plan Note (Signed)
BP Readings from Last 3 Encounters:  09/04/14 169/91  08/15/14 159/93  05/17/14 154/95    Lab Results  Component Value Date   NA 141 09/04/2014   K 4.5 09/04/2014   CREATININE 0.89 09/04/2014    Assessment: Blood pressure control:  Not controlled Progress toward BP goal:   Not at goal Comments: She is on Losartan 50mg  daily  Plan: Medications:  Her BP has been elevated during many office visits. She denies pain/stress today. Will increased Losartan to 100mg  daily Educational resources provided: brochure;handout;video Self management tools provided:   Other plans: Follow up in 1 week. Will repeat BMET at that time for Cr and K

## 2014-09-05 NOTE — Progress Notes (Signed)
   Subjective:    Patient ID: Susan Holmes, female    DOB: 1956-02-16, 58 y.o.   MRN: [id number]  HPI Susan Holmes is a 58 year old woman with PMH of DM2, HLP, tobacco abuse, HTN, who presents for follow up visit for her diabetes and for evaluation of bilateral leg pain that has been present for 2 months now. The pain is worse at night and with rest, it is described at a stretch, initially present in the lower legs only but now also in her thighs and bilateral buttocks. Walking/standing does not make the pain worse. Resting does not make it better. She has not taken any medications to ease the pain. The pain improves on its own.     Review of Systems  Constitutional: Negative for fever, chills, diaphoresis, activity change, appetite change, fatigue and unexpected weight change.  Respiratory: Negative for cough and shortness of breath.   Cardiovascular: Negative for chest pain, palpitations and leg swelling.  Gastrointestinal: Negative for abdominal pain and diarrhea.  Genitourinary: Negative for dysuria and frequency.  Musculoskeletal: Positive for myalgias.  Skin: Negative for pallor, rash and wound.  Neurological: Negative for dizziness, weakness and light-headedness.  Psychiatric/Behavioral: Negative for agitation.       Objective:   Physical Exam  Nursing note and vitals reviewed. Constitutional: She is oriented to person, place, and time. She appears well-developed and well-nourished. No distress.  Eyes: No scleral icterus.  Cardiovascular: Normal rate and regular rhythm.   Pulmonary/Chest: Effort normal and breath sounds normal. No respiratory distress. She has no wheezes. She has no rales.  Abdominal: Soft. There is no tenderness.  Musculoskeletal: Normal range of motion. She exhibits no edema and no tenderness.  Bilateral calves symmetric with no TTP, no edema or erythema, no rash Strength 5/5 bilaterally  Neurological: She is alert and oriented to person, place, and time.  Coordination normal.  Skin: Skin is warm and dry. No rash noted. She is not diaphoretic. No erythema.  Psychiatric: She has a normal mood and affect.          Assessment & Plan:

## 2014-09-05 NOTE — Assessment & Plan Note (Addendum)
Of 2 months. Not improved with rest. Could be multifactorial.  DDx for include peripheral neuropathy, claudication, myositis from statin use, muscle cramps from electrolyte derangement. She has no hx of trauma or injury to the legs with no decreased strength.  -CMP with nl K, mildly elevated AST ALT -CK mildly elevated--will change to pravastatin -Ordered ABI--pt is current smoker with uncontrolled BP -Increase gabapentin to 600mg  TID  Of note, she may benefit from [facility] for medication management. Will ask pt during her upcoming visit if she will be interested in [facility].

## 2014-09-05 NOTE — Assessment & Plan Note (Signed)
She is on lipitor 40mg  daily but is now experiencing bl lower extremity ache.   CMP with slightly elevated AST and ALT CK elevated to 579 but her baseline is unkwon.  Will consider changing to less potent statin (pravastatin)

## 2014-09-06 ENCOUNTER — Other Ambulatory Visit: Payer: Self-pay | Admitting: *Deleted

## 2014-09-09 NOTE — Progress Notes (Signed)
Case discussed with Dr. Kennerly soon after the resident saw the patient.  We reviewed the resident's history and exam and pertinent patient test results.  I agree with the assessment, diagnosis, and plan of care documented in the resident's note. 

## 2014-09-11 MED ORDER — ACCU-CHEK AVIVA PLUS W/DEVICE KIT: PACK | Status: DC

## 2014-09-12 ENCOUNTER — Ambulatory Visit: Payer: PRIVATE HEALTH INSURANCE | Admitting: Internal Medicine

## 2014-09-15 ENCOUNTER — Other Ambulatory Visit: Payer: Self-pay | Admitting: Internal Medicine

## 2014-09-18 ENCOUNTER — Other Ambulatory Visit: Payer: Self-pay | Admitting: *Deleted

## 2014-09-18 ENCOUNTER — Telehealth: Payer: Self-pay | Admitting: *Deleted

## 2014-09-18 NOTE — Telephone Encounter (Signed)
This encounter was open in error, will change to phone call.

## 2014-09-18 NOTE — Telephone Encounter (Deleted)
Pt called to report she had her Blood Pressure checked today at [place].   Rt arm - 156/100  And  Left arm - 171/100 She also states she is having heart papillations with sharp pains.   Pt # [id number]  Pt last BP in clinic on 9/9 169/91 Losartan was increased on that visit.

## 2014-09-18 NOTE — Telephone Encounter (Signed)
Thank you Edd Fabian

## 2014-09-18 NOTE — Telephone Encounter (Signed)
Pt called to report she had her Blood Pressure checked today at [place].   Rt arm - 156/100  And  Left arm - 171/100 She also states she is having heart palpatations with sharp pains.   Pt # [id number]  Pt last BP in clinic on 9/9 169/91 Losartan was increased on that visit.  Pt was supposed to return in 1 week.  I talked with pt and heart palpitations are not new. They have been going on and she states she has talked with her Doctor about them. No new SOB, diaphoresis or nausea.  Will see tomorrow morning at 10:45

## 2014-09-19 ENCOUNTER — Encounter: Payer: Self-pay | Admitting: Licensed Clinical Social Worker

## 2014-09-19 ENCOUNTER — Telehealth: Payer: Self-pay | Admitting: *Deleted

## 2014-09-19 ENCOUNTER — Encounter: Payer: Self-pay | Admitting: Internal Medicine

## 2014-09-19 ENCOUNTER — Ambulatory Visit (INDEPENDENT_AMBULATORY_CARE_PROVIDER_SITE_OTHER): Payer: PRIVATE HEALTH INSURANCE | Admitting: Internal Medicine

## 2014-09-19 VITALS — BP 154/93 | HR 66 | Temp 97.6°F | Ht 67.0 in | Wt 176.8 lb

## 2014-09-19 DIAGNOSIS — M79604 Pain in right leg: Secondary | ICD-10-CM

## 2014-09-19 DIAGNOSIS — M75101 Unspecified rotator cuff tear or rupture of right shoulder, not specified as traumatic: Secondary | ICD-10-CM

## 2014-09-19 DIAGNOSIS — M719 Bursopathy, unspecified: Secondary | ICD-10-CM

## 2014-09-19 DIAGNOSIS — IMO0002 Reserved for concepts with insufficient information to code with codable children: Secondary | ICD-10-CM

## 2014-09-19 DIAGNOSIS — Z598 Other problems related to housing and economic circumstances: Secondary | ICD-10-CM

## 2014-09-19 DIAGNOSIS — M79605 Pain in left leg: Secondary | ICD-10-CM

## 2014-09-19 DIAGNOSIS — Z748 Other problems related to care provider dependency: Secondary | ICD-10-CM

## 2014-09-19 DIAGNOSIS — M79609 Pain in unspecified limb: Secondary | ICD-10-CM

## 2014-09-19 DIAGNOSIS — E1149 Type 2 diabetes mellitus with other diabetic neurological complication: Secondary | ICD-10-CM

## 2014-09-19 DIAGNOSIS — E785 Hyperlipidemia, unspecified: Secondary | ICD-10-CM

## 2014-09-19 DIAGNOSIS — E1165 Type 2 diabetes mellitus with hyperglycemia: Secondary | ICD-10-CM

## 2014-09-19 DIAGNOSIS — M67919 Unspecified disorder of synovium and tendon, unspecified shoulder: Secondary | ICD-10-CM

## 2014-09-19 DIAGNOSIS — I10 Essential (primary) hypertension: Secondary | ICD-10-CM

## 2014-09-19 MED ORDER — AMLODIPINE BESYLATE 5 MG PO TABS: 5.0000 mg | ORAL_TABLET | Freq: Every day | ORAL | Status: DC

## 2014-09-19 MED ORDER — NAPROXEN 250 MG PO TABS: 250.0000 mg | ORAL_TABLET | Freq: Two times a day (BID) | ORAL | Status: DC

## 2014-09-19 MED ORDER — PRAVASTATIN SODIUM 40 MG PO TABS: 40.0000 mg | ORAL_TABLET | Freq: Every day | ORAL | Status: DC

## 2014-09-19 NOTE — Assessment & Plan Note (Signed)
She met with Golden Hurter, the clinic CSW, and had assistance filling out her [employer] application. The form was signed and returned to [facility].

## 2014-09-19 NOTE — Assessment & Plan Note (Addendum)
She has no thigh pain/tenderness today but complained of bilateral knee pain of 1-2 days with no TTP on physical exam and nl ROM. The pain does not interfere with mobility and is reported as mild, more like muscle cramps.   -Rx naproxen 250mg  BID PRN for pain/cramps.

## 2014-09-19 NOTE — Patient Instructions (Addendum)
-  Your blood pressure is still elevated.  -Start taking amlodipine 5mg  daily.  -Stop taking atorvastatin (Lipitor), this was causing your muscles to hurt.  -Start taking pravastatin (pravachol) for your cholesterol.  -Keep checking your blood pressure at home or at the Pharmacy and bring this information with you.  -Follow up with [place] in 2-3 weeks for blood pressure recheck.    Please bring your medicines with you each time you come.   Medicines may be  Eye drops  Herbal   Vitamins  Pills  Seeing these help [place] take care of you.

## 2014-09-19 NOTE — Assessment & Plan Note (Addendum)
BP Readings from Last 3 Encounters:  09/19/14 154/93  09/04/14 169/91  08/15/14 159/93    Lab Results  Component Value Date   NA 141 09/04/2014   K 4.5 09/04/2014   CREATININE 0.89 09/04/2014    Assessment: Blood pressure control:   Not controlled Progress toward BP goal:   not at goal Comments: She is on Cozaar 100mg  (increased during her last visit from 50mg  daily), she tells me she has not been taking lopressor--she does not have her medications with her during this visit.   Plan: Medications:  continue current medications and start amlodipine 5mg  daily.  Educational resources provided: Therapist, sports tools provided:   Other plans: She will check her BP at home and will follow up in 2-3 weeks. Recheck BMET today with increased dose of ARB. Of note, she may benefit from HCTZ if a third agent is needed.

## 2014-09-19 NOTE — Assessment & Plan Note (Signed)
CK mildly elevated per CMP drawn earlier this month which was concerning for statin induced myositis given her complains of bl leg and thing ache at that time. However, per her Pharmacy the patient had not filled her Rx for Lipitor in >79yr. The patient later confirmed that she has not taken this medication in >1 yr.  She does have hyperlipidemia and will need statin tx given her hx of DM2.  Rx pravastatin 40mg  daily

## 2014-09-19 NOTE — Assessment & Plan Note (Signed)
Lab Results  Component Value Date   HGBA1C 6.5 08/15/2014   HGBA1C 9.6 02/28/2014   HGBA1C 5.6 11/02/2013     Assessment: Diabetes control:  controlled Progress toward A1C goal:   at goal Comments: She brought her CBC meter with reads in the 80-120 before breakfast and 130-170 after dinner. She had one read of BS of 65 but this was at 10am and she had missed breakfast that day. She is on Lantus 20 units qHS and metformin 1000mg  BID ac.   Plan: Medications:  continue current medications Home glucose monitoring: Frequency:   Timing:   Instruction/counseling given: reminded to bring blood glucose meter & log to each visit, reminded to bring medications to every visit.  Educational resources provided: Designer, jewellery tools provided: copy of home glucose meter download Other plans: Follow up in 2-3 weeks.

## 2014-09-19 NOTE — Progress Notes (Signed)
   Subjective:    Patient ID: Susan Holmes, female    DOB: 18-Aug-1956, 58 y.o.   MRN: [id number]  HPI Ms. Elicker is a 58 year old woman with PMH of DM2, HTN, HLP, presenting for follow up visit for her HTN. In addition, she asks for assistance filling out transportation forms. She also complains of right shoulder pain that is worse with movement and bilateral knee pain that started a few days ago. She denies increase in activity, lifting heavy objects, or decreased hydration. She has had this type of pain before that responded to Ibuprofen and resolved on its own.    Review of Systems  Constitutional: Negative for fever, chills, diaphoresis, activity change, appetite change, fatigue and unexpected weight change.  Respiratory: Negative for cough and shortness of breath.   Cardiovascular: Negative for chest pain, palpitations and leg swelling.  Gastrointestinal: Negative for nausea, vomiting, abdominal pain and diarrhea.  Genitourinary: Negative for dysuria.  Musculoskeletal: Positive for arthralgias.  Skin: Negative for rash.  Neurological: Negative for dizziness, weakness, light-headedness and headaches.  Psychiatric/Behavioral: Negative for agitation.       Objective:   Physical Exam  Nursing note and vitals reviewed. Constitutional: She is oriented to person, place, and time. She appears well-developed and well-nourished. No distress.  Cardiovascular: Normal rate and regular rhythm.   Pulmonary/Chest: Effort normal. No respiratory distress. She exhibits tenderness.  She has chest wall tenderness that worsens with arm adduction.   Musculoskeletal: Normal range of motion. She exhibits tenderness.  TTP of the right shoulder with nl ROM.  Nl ROM of bilateral knees with no TTP.   Neurological: She is alert and oriented to person, place, and time. Coordination normal.  Skin: Skin is warm and dry. No rash noted. She is not diaphoretic. No erythema.  Psychiatric: She has a normal mood  and affect.          Assessment & Plan:

## 2014-09-19 NOTE — Assessment & Plan Note (Signed)
He right shoulder pain is a chronic issue for her that improves with NSAID use.  -She will tale Ibuprofen 800mg  q6hr PRN for pain, encouraged oral hydration.

## 2014-09-19 NOTE — Progress Notes (Signed)
Ms. Burkard presents for scheduled [facility] appointment today and requesting physician to complete SCAT PART B.  CSW met with Ms. Tall and discussed differences between SCAT and TAMS.  Pt states she uses transportation mostly for medical appointments.  CSW informed Ms. Derstine of option of Training and development officer for [employer] transportation.  CSW provided Ms. Dobkins with brochure.  SCAT can be used for shopping and non-medical, pt in agreement.  Will forward SCAT Part B to physician, both Part A and  Part B will be faxed by [facility] on behalf of Ms. Sheffler.  CSW provided Ms. Elford with contact information in case pt runs in to difficulty arranging medical transportation.

## 2014-09-19 NOTE — Telephone Encounter (Signed)
Pt called after being seen in the clinic today.  SHe picked up her new Rx's and talked to pharmacist.  She has not taken Lipitor in over a year. So that is not the cause of her problems. She would like you to call her at # [phone]

## 2014-09-20 LAB — BASIC METABOLIC PANEL WITH GFR
BUN: 9 mg/dL (ref 6–23)
CO2: 23 mEq/L (ref 19–32)
Calcium: 9.9 mg/dL (ref 8.4–10.5)
Chloride: 106 mEq/L (ref 96–112)
Creat: 0.82 mg/dL (ref 0.50–1.10)
GFR, Est African American: >89 mL/min
GFR, Est Non African American: 79 mL/min
Glucose, Bld: 89 mg/dL (ref 70–99)
Potassium: 4.3 mEq/L (ref 3.5–5.3)
Sodium: 140 mEq/L (ref 135–145)

## 2014-09-23 NOTE — Progress Notes (Signed)
Medicine attending, Medical history, presenting problems, physical findings, medications, reviewed with resident physician Dr. Hayes Ludwig on the day of the patient's clinic visit and I concur with her evaluation and management plan. Murriel Hopper, M.D., [place]

## 2014-09-24 ENCOUNTER — Encounter: Payer: Self-pay | Admitting: *Deleted

## 2014-09-27 NOTE — Progress Notes (Signed)
Pt aware of appt [facility] 09/30/14 9AM - ankle brachial index. Hilda Blades Zohair Epp RN 09/27/14 10:30AM

## 2014-09-30 ENCOUNTER — Ambulatory Visit (HOSPITAL_COMMUNITY)
Admission: RE | Admit: 2014-09-30 | Discharge: 2014-09-30 | Disposition: A | Discharge status: Home / Self Care | Payer: PRIVATE HEALTH INSURANCE | Source: Ambulatory Visit | Attending: Internal Medicine | Admitting: Internal Medicine

## 2014-09-30 DIAGNOSIS — M79605 Pain in left leg: Secondary | ICD-10-CM

## 2014-09-30 DIAGNOSIS — F17200 Nicotine dependence, unspecified, uncomplicated: Secondary | ICD-10-CM

## 2014-09-30 DIAGNOSIS — M79604 Pain in right leg: Secondary | ICD-10-CM | POA: Insufficient documentation

## 2014-09-30 NOTE — Progress Notes (Signed)
VASCULAR LAB PRELIMINARY  ARTERIAL  ABI completed:    RIGHT    LEFT    PRESSURE WAVEFORM  PRESSURE WAVEFORM  BRACHIAL 170 triphasic BRACHIAL 167 triphasic  DP   DP    AT 191 triphasic AT 167 triphasic  PT 145 triphasic PT 161 triphasic  PER   PER    GREAT TOE  NA GREAT TOE  NA    RIGHT LEFT  ABI >1.0 0.98     Demyah Smyre, RVT 09/30/2014, 10:20 AM

## 2014-10-01 ENCOUNTER — Ambulatory Visit (HOSPITAL_COMMUNITY)
Admission: RE | Admit: 2014-10-01 | Discharge: 2014-10-01 | Disposition: A | Discharge status: Home / Self Care | Payer: PRIVATE HEALTH INSURANCE | Source: Ambulatory Visit | Attending: Internal Medicine | Admitting: Internal Medicine

## 2014-10-01 DIAGNOSIS — R0600 Dyspnea, unspecified: Secondary | ICD-10-CM | POA: Diagnosis not present

## 2014-10-01 DIAGNOSIS — F17200 Nicotine dependence, unspecified, uncomplicated: Secondary | ICD-10-CM

## 2014-10-01 DIAGNOSIS — Z720 Tobacco use: Secondary | ICD-10-CM | POA: Insufficient documentation

## 2014-10-01 LAB — PULMONARY FUNCTION TEST
FEF 25-75 Pre: 1.48 L/sec
FEF2575-%Pred-Pre: 64 %
FEV1-%Pred-Pre: 60 %
FEV1-Pre: 1.44 L
FEV1FVC-%Pred-Pre: 101 %
FEV6-%Pred-Pre: 61 %
FEV6-Pre: 1.79 L
FEV6FVC-%Pred-Pre: 103 %
FVC-%Pred-Pre: 59 %
FVC-Pre: 1.79 L
Pre FEV1/FVC ratio: 80 %
Pre FEV6/FVC Ratio: 100 %

## 2014-10-02 ENCOUNTER — Encounter: Payer: Self-pay | Admitting: Licensed Clinical Social Worker

## 2014-10-03 ENCOUNTER — Ambulatory Visit (HOSPITAL_COMMUNITY)
Admission: RE | Admit: 2014-10-03 | Discharge: 2014-10-03 | Disposition: A | Discharge status: Home / Self Care | Payer: PRIVATE HEALTH INSURANCE | Source: Ambulatory Visit | Attending: Internal Medicine | Admitting: Internal Medicine

## 2014-10-03 DIAGNOSIS — Z720 Tobacco use: Secondary | ICD-10-CM | POA: Insufficient documentation

## 2014-10-03 DIAGNOSIS — Z024 Encounter for examination for driving license: Secondary | ICD-10-CM | POA: Insufficient documentation

## 2014-10-03 DIAGNOSIS — R0600 Dyspnea, unspecified: Secondary | ICD-10-CM | POA: Diagnosis not present

## 2014-10-03 DIAGNOSIS — F17200 Nicotine dependence, unspecified, uncomplicated: Secondary | ICD-10-CM

## 2014-10-03 LAB — PULMONARY FUNCTION TEST
DL/VA % pred: 106 %
DL/VA: 5.48 ml/min/mmHg/L
DLCO unc % pred: 63 %
DLCO unc: 17.97 ml/min/mmHg
FEF 25-75 Pre: 2.34 L/sec
FEF2575-%Pred-Pre: 101 %
FEV1-%Pred-Pre: 70 %
FEV1-Pre: 1.67 L
FEV1FVC-%Pred-Pre: 108 %
FEV6-%Pred-Pre: 65 %
FEV6-Pre: 1.92 L
FEV6FVC-%Pred-Pre: 103 %
FVC-%Pred-Pre: 64 %
FVC-Pre: 1.95 L
Pre FEV1/FVC ratio: 86 %
Pre FEV6/FVC Ratio: 100 %
RV % pred: 75 %
RV: 1.6 L
TLC % pred: 67 %
TLC: 3.71 L

## 2014-10-07 ENCOUNTER — Ambulatory Visit (HOSPITAL_COMMUNITY)
Admission: RE | Admit: 2014-10-07 | Discharge: 2014-10-07 | Disposition: A | Discharge status: Home / Self Care | Payer: PRIVATE HEALTH INSURANCE | Source: Ambulatory Visit | Attending: Internal Medicine | Admitting: Internal Medicine

## 2014-10-07 ENCOUNTER — Observation Stay (HOSPITAL_COMMUNITY)
Admission: AD | Admit: 2014-10-07 | Discharge: 2014-10-09 | Disposition: A | Discharge status: Home / Self Care | Payer: PRIVATE HEALTH INSURANCE | Source: Ambulatory Visit | Attending: Oncology | Admitting: Oncology

## 2014-10-07 ENCOUNTER — Ambulatory Visit: Payer: PRIVATE HEALTH INSURANCE | Admitting: Internal Medicine

## 2014-10-07 ENCOUNTER — Ambulatory Visit (INDEPENDENT_AMBULATORY_CARE_PROVIDER_SITE_OTHER): Payer: PRIVATE HEALTH INSURANCE | Admitting: Internal Medicine

## 2014-10-07 ENCOUNTER — Encounter (HOSPITAL_COMMUNITY): Payer: Self-pay

## 2014-10-07 ENCOUNTER — Encounter: Payer: Self-pay | Admitting: Internal Medicine

## 2014-10-07 VITALS — BP 143/87 | HR 85 | Temp 98.2°F | Wt 176.0 lb

## 2014-10-07 DIAGNOSIS — F17210 Nicotine dependence, cigarettes, uncomplicated: Secondary | ICD-10-CM | POA: Diagnosis not present

## 2014-10-07 DIAGNOSIS — F419 Anxiety disorder, unspecified: Secondary | ICD-10-CM | POA: Diagnosis not present

## 2014-10-07 DIAGNOSIS — IMO0002 Reserved for concepts with insufficient information to code with codable children: Secondary | ICD-10-CM

## 2014-10-07 DIAGNOSIS — E785 Hyperlipidemia, unspecified: Secondary | ICD-10-CM

## 2014-10-07 DIAGNOSIS — Z0000 Encounter for general adult medical examination without abnormal findings: Secondary | ICD-10-CM

## 2014-10-07 DIAGNOSIS — R002 Palpitations: Secondary | ICD-10-CM

## 2014-10-07 DIAGNOSIS — E1165 Type 2 diabetes mellitus with hyperglycemia: Secondary | ICD-10-CM | POA: Diagnosis not present

## 2014-10-07 DIAGNOSIS — Z794 Long term (current) use of insulin: Secondary | ICD-10-CM | POA: Insufficient documentation

## 2014-10-07 DIAGNOSIS — Z7982 Long term (current) use of aspirin: Secondary | ICD-10-CM | POA: Diagnosis not present

## 2014-10-07 DIAGNOSIS — I10 Essential (primary) hypertension: Secondary | ICD-10-CM

## 2014-10-07 DIAGNOSIS — Z79899 Other long term (current) drug therapy: Secondary | ICD-10-CM | POA: Insufficient documentation

## 2014-10-07 DIAGNOSIS — E1149 Type 2 diabetes mellitus with other diabetic neurological complication: Secondary | ICD-10-CM

## 2014-10-07 DIAGNOSIS — I2510 Atherosclerotic heart disease of native coronary artery without angina pectoris: Secondary | ICD-10-CM | POA: Diagnosis not present

## 2014-10-07 DIAGNOSIS — E780 Pure hypercholesterolemia: Secondary | ICD-10-CM | POA: Insufficient documentation

## 2014-10-07 DIAGNOSIS — F17200 Nicotine dependence, unspecified, uncomplicated: Secondary | ICD-10-CM

## 2014-10-07 DIAGNOSIS — Z748 Other problems related to care provider dependency: Secondary | ICD-10-CM

## 2014-10-07 DIAGNOSIS — R072 Precordial pain: Secondary | ICD-10-CM

## 2014-10-07 DIAGNOSIS — Z1239 Encounter for other screening for malignant neoplasm of breast: Secondary | ICD-10-CM

## 2014-10-07 DIAGNOSIS — M25511 Pain in right shoulder: Secondary | ICD-10-CM | POA: Diagnosis not present

## 2014-10-07 DIAGNOSIS — R9439 Abnormal result of other cardiovascular function study: Secondary | ICD-10-CM

## 2014-10-07 DIAGNOSIS — Z720 Tobacco use: Secondary | ICD-10-CM

## 2014-10-07 DIAGNOSIS — M25519 Pain in unspecified shoulder: Secondary | ICD-10-CM

## 2014-10-07 DIAGNOSIS — Z87898 Personal history of other specified conditions: Secondary | ICD-10-CM | POA: Diagnosis present

## 2014-10-07 DIAGNOSIS — D509 Iron deficiency anemia, unspecified: Secondary | ICD-10-CM | POA: Insufficient documentation

## 2014-10-07 DIAGNOSIS — R079 Chest pain, unspecified: Principal | ICD-10-CM

## 2014-10-07 DIAGNOSIS — Z01810 Encounter for preprocedural cardiovascular examination: Secondary | ICD-10-CM | POA: Insufficient documentation

## 2014-10-07 DIAGNOSIS — F329 Major depressive disorder, single episode, unspecified: Secondary | ICD-10-CM | POA: Insufficient documentation

## 2014-10-07 DIAGNOSIS — M75101 Unspecified rotator cuff tear or rupture of right shoulder, not specified as traumatic: Secondary | ICD-10-CM

## 2014-10-07 LAB — COMPREHENSIVE METABOLIC PANEL WITH GFR
ALT: 14 U/L (ref 0–35)
AST: 17 U/L (ref 0–37)
Albumin: 3.7 g/dL (ref 3.5–5.2)
Alkaline Phosphatase: 68 U/L (ref 39–117)
Anion gap: 14 (ref 5–15)
BUN: 10 mg/dL (ref 6–23)
CO2: 23 meq/L (ref 19–32)
Calcium: 10.1 mg/dL (ref 8.4–10.5)
Chloride: 104 meq/L (ref 96–112)
Creatinine, Ser: 0.89 mg/dL (ref 0.50–1.10)
GFR calc Af Amer: 81 mL/min — ABNORMAL LOW
GFR calc non Af Amer: 70 mL/min — ABNORMAL LOW
Glucose, Bld: 150 mg/dL — ABNORMAL HIGH (ref 70–99)
Potassium: 4.3 meq/L (ref 3.7–5.3)
Sodium: 141 meq/L (ref 137–147)
Total Bilirubin: 0.3 mg/dL (ref 0.3–1.2)
Total Protein: 6.8 g/dL (ref 6.0–8.3)

## 2014-10-07 LAB — TROPONIN I
Troponin I: <0.3 ng/mL
Troponin I: <0.3 ng/mL (ref <0.30)

## 2014-10-07 LAB — CBC WITH DIFFERENTIAL/PLATELET
Basophils Absolute: 0 K/uL (ref 0.0–0.1)
Basophils Relative: 0 % (ref 0–1)
Eosinophils Absolute: 0.3 K/uL (ref 0.0–0.7)
Eosinophils Relative: 5 % (ref 0–5)
HCT: 38.1 % (ref 36.0–46.0)
Hemoglobin: 11.7 g/dL — ABNORMAL LOW (ref 12.0–15.0)
Lymphocytes Relative: 57 % — ABNORMAL HIGH (ref 12–46)
Lymphs Abs: 3.7 K/uL (ref 0.7–4.0)
MCH: 21.7 pg — ABNORMAL LOW (ref 26.0–34.0)
MCHC: 30.7 g/dL (ref 30.0–36.0)
MCV: 70.6 fL — ABNORMAL LOW (ref 78.0–100.0)
Monocytes Absolute: 0.3 K/uL (ref 0.1–1.0)
Monocytes Relative: 5 % (ref 3–12)
Neutro Abs: 2.1 K/uL (ref 1.7–7.7)
Neutrophils Relative %: 33 % — ABNORMAL LOW (ref 43–77)
Platelets: 218 K/uL (ref 150–400)
RBC: 5.4 MIL/uL — ABNORMAL HIGH (ref 3.87–5.11)
RDW: 16.2 % — ABNORMAL HIGH (ref 11.5–15.5)
WBC: 6.4 K/uL (ref 4.0–10.5)

## 2014-10-07 LAB — GLUCOSE, CAPILLARY
Glucose-Capillary: 112 mg/dL — ABNORMAL HIGH (ref 70–99)
Glucose-Capillary: 127 mg/dL — ABNORMAL HIGH (ref 70–99)
Glucose-Capillary: 109 mg/dL — ABNORMAL HIGH (ref 70–99)

## 2014-10-07 MED ORDER — GI COCKTAIL ~~LOC~~: 30.0000 mL | Freq: Four times a day (QID) | ORAL | Status: DC | PRN

## 2014-10-07 MED ORDER — INSULIN GLARGINE 100 UNIT/ML ~~LOC~~ SOLN
20.0000 [IU] | Freq: Every day | SUBCUTANEOUS | Status: DC
  Administered 2014-10-07 – 2014-10-08 (×2): 20 [IU] via SUBCUTANEOUS
  Filled 2014-10-07 (×3): qty 0.2

## 2014-10-07 MED ORDER — INSULIN ASPART 100 UNIT/ML ~~LOC~~ SOLN
0.0000 [IU] | Freq: Three times a day (TID) | SUBCUTANEOUS | Status: DC
  Administered 2014-10-08: 2 [IU] via SUBCUTANEOUS
  Administered 2014-10-08: 3 [IU] via SUBCUTANEOUS
  Administered 2014-10-08: 2 [IU] via SUBCUTANEOUS
  Administered 2014-10-09: 3 [IU] via SUBCUTANEOUS
  Administered 2014-10-09 (×2): 2 [IU] via SUBCUTANEOUS

## 2014-10-07 MED ORDER — AMLODIPINE BESYLATE 5 MG PO TABS
5.0000 mg | ORAL_TABLET | Freq: Every day | ORAL | Status: DC
  Administered 2014-10-08: 5 mg via ORAL
  Filled 2014-10-07 (×2): qty 1

## 2014-10-07 MED ORDER — ASPIRIN 81 MG PO CHEW
81.0000 mg | CHEWABLE_TABLET | Freq: Once | ORAL | Status: AC
  Administered 2014-10-07: 81 mg via ORAL

## 2014-10-07 MED ORDER — PRAVASTATIN SODIUM 40 MG PO TABS
40.0000 mg | ORAL_TABLET | Freq: Every day | ORAL | Status: DC
  Administered 2014-10-08: 40 mg via ORAL
  Filled 2014-10-07 (×2): qty 1

## 2014-10-07 MED ORDER — ENOXAPARIN SODIUM 40 MG/0.4ML ~~LOC~~ SOLN
40.0000 mg | SUBCUTANEOUS | Status: DC
  Administered 2014-10-07 – 2014-10-08 (×2): 40 mg via SUBCUTANEOUS
  Filled 2014-10-07 (×3): qty 0.4

## 2014-10-07 MED ORDER — ACETAMINOPHEN 325 MG PO TABS: 650.0000 mg | ORAL_TABLET | ORAL | Status: DC | PRN

## 2014-10-07 MED ORDER — LOPERAMIDE HCL 2 MG PO CAPS: 2.0000 mg | ORAL_CAPSULE | Freq: Every day | ORAL | Status: DC | PRN

## 2014-10-07 MED ORDER — ETODOLAC ER 400 MG PO TB24: 400.0000 mg | ORAL_TABLET | Freq: Every day | ORAL | Status: DC

## 2014-10-07 MED ORDER — IPRATROPIUM-ALBUTEROL 0.5-2.5 (3) MG/3ML IN SOLN: 3.0000 mL | RESPIRATORY_TRACT | Status: DC | PRN

## 2014-10-07 MED ORDER — ONDANSETRON HCL 4 MG/2ML IJ SOLN: 4.0000 mg | Freq: Four times a day (QID) | INTRAMUSCULAR | Status: DC | PRN

## 2014-10-07 MED ORDER — IPRATROPIUM-ALBUTEROL 18-103 MCG/ACT IN AERO: 2.0000 | INHALATION_SPRAY | RESPIRATORY_TRACT | Status: DC | PRN

## 2014-10-07 MED ORDER — TRAZODONE HCL 150 MG PO TABS
250.0000 mg | ORAL_TABLET | Freq: Every day | ORAL | Status: DC
  Administered 2014-10-07 – 2014-10-08 (×2): 250 mg via ORAL
  Filled 2014-10-07 (×3): qty 1

## 2014-10-07 MED ORDER — ASPIRIN EC 81 MG PO TBEC: 81.0000 mg | DELAYED_RELEASE_TABLET | Freq: Once | ORAL | Status: DC

## 2014-10-07 MED ORDER — GABAPENTIN 600 MG PO TABS
600.0000 mg | ORAL_TABLET | Freq: Three times a day (TID) | ORAL | Status: DC
  Administered 2014-10-07 – 2014-10-09 (×5): 600 mg via ORAL
  Filled 2014-10-07 (×7): qty 1

## 2014-10-07 MED ORDER — GABAPENTIN 300 MG PO CAPS
600.0000 mg | ORAL_CAPSULE | Freq: Three times a day (TID) | ORAL | Status: DC
  Filled 2014-10-07: qty 2

## 2014-10-07 MED ORDER — MIRTAZAPINE 15 MG PO TABS
15.0000 mg | ORAL_TABLET | Freq: Every day | ORAL | Status: DC
  Administered 2014-10-07 – 2014-10-08 (×2): 15 mg via ORAL
  Filled 2014-10-07 (×3): qty 1

## 2014-10-07 MED ORDER — NAPROXEN 250 MG PO TABS
250.0000 mg | ORAL_TABLET | Freq: Two times a day (BID) | ORAL | Status: DC
  Administered 2014-10-07 – 2014-10-09 (×4): 250 mg via ORAL
  Filled 2014-10-07 (×6): qty 1

## 2014-10-07 MED ORDER — ASPIRIN EC 81 MG PO TBEC
81.0000 mg | DELAYED_RELEASE_TABLET | Freq: Every day | ORAL | Status: DC
  Administered 2014-10-08: 81 mg via ORAL
  Filled 2014-10-07 (×2): qty 1

## 2014-10-07 MED ORDER — FERROUS SULFATE 325 (65 FE) MG PO TABS
325.0000 mg | ORAL_TABLET | Freq: Every day | ORAL | Status: DC
  Administered 2014-10-09: 325 mg via ORAL
  Filled 2014-10-07 (×3): qty 1

## 2014-10-07 MED ORDER — LOSARTAN POTASSIUM 50 MG PO TABS
50.0000 mg | ORAL_TABLET | Freq: Every day | ORAL | Status: DC
  Administered 2014-10-08: 50 mg via ORAL
  Filled 2014-10-07: qty 1

## 2014-10-07 NOTE — Progress Notes (Signed)
Subjective:   Patient ID: Susan Holmes female   DOB: 1956/04/21 58 y.o.   MRN: [id number]  HPI: [name] is a 58 y.o. female with HTN and other PMH as listed below presenting to opc today for HTN follow up visit.   HTN: BP improved to 143/87 today. Last visit 9/24 with Dr. Hayes Ludwig. Cozaar 153m and Amlodipine 540mwas added. However, she has only been taking cozaar 5059maily (as prescribed on her bottle) and amlodipine 5mg57mily.  BB was stopped on last visit.   She also complains of ongoing chest pain/palpitations at this time. The palpitations she describes as tremors spreading to her left chest for several months, intermittent in nature, lasting 3-4 minutes, spontaneously resolves. However, she also endorses ongoing chest pressure in substernal region for several weeks. Not affected by position, no change with leaning forward, but does have mild tenderness to deep palpation of substernal region on the right. She also endorses occasional leg swelling and persistent DOE (even walking to mail box). Her risk factors include ongoing smoking for 20+ years, HTN, DM2, and family history of heart disease including her father who had a massive MI and is now deceased.   Health maintenance: mammogram scheduling (order already has been placed), declined flu vaccine. agreeable for tdap  She is also requesting [facility] Medical History  Diagnosis Date  . Hepatitis B infection pt unsure    resolved  . Diabetes mellitus, type II   . HTN (hypertension)   . Hyperlipidemia   . Congenital blindness     L eye  . Tobacco abuse     .5 PPD smoker  . Anxiety   . Depression   . Shortness of breath   . Arthritis    Current Outpatient Prescriptions  Medication Sig Dispense Refill  . ACCU-CHEK FASTCLIX LANCETS MISC Check blood sugar 2x a day as instructed dx cod 250.00 insulin requiring  102 each  5  . amLODipine (NORVASC) 5 MG tablet Take 1 tablet (5 mg total) by mouth  daily.  30 tablet  1  . aspirin EC 81 MG tablet Take 1 tablet (81 mg total) by mouth daily.  30 tablet  12  . Blood Glucose Monitoring Suppl (ACCU-CHEK AVIVA PLUS) W/DEVICE KIT Use to check blood sugar 3 to 4 times daily. diag code 250.62. Insulin dependent  1 kit  0  . etodolac (LODINE XL) 400 MG 24 hr tablet Take 1 tablet (400 mg total) by mouth daily.  30 tablet  3  . gabapentin (NEURONTIN) 300 MG capsule Take 2 capsules (600 mg total) by mouth 3 (three) times daily.  120 capsule  2  . glucose blood (ACCU-CHEK COMPACT STRIPS) test strip Use as instructed to check sugars twice daily  100 each  12  . Lactobacillus (ACIDOPHILUS PROBIOTIC) 100 MG CAPS Take 1 capsule (100 mg total) by mouth daily as needed.  30 capsule  0  . LANTUS SOLOSTAR 100 UNIT/ML Solostar Pen INJECT 20 UNITS INTO THE SKIN EVERY NIGHT AT BEDTIME  15 mL  3  . loperamide (IMODIUM) 2 MG capsule TAKE ONE CAPSULE BY MOUTH BY MOUTH AS NEEDED FOR DIARRHEA OR LOOSE STOOLS AS DIRECTED  30 capsule  0  . losartan (COZAAR) 100 MG tablet Take 1 tablet (100 mg total) by mouth daily.  30 tablet  1  . metFORMIN (GLUCOPHAGE) 1000 MG tablet Take 1 tablet (1,000 mg total) by mouth 2 (two) times daily  with a meal.  60 tablet  6  . naproxen (NAPROSYN) 250 MG tablet Take 1 tablet (250 mg total) by mouth 2 (two) times daily with a meal.  60 tablet  1  . nicotine polacrilex (NICORETTE) 4 MG gum Take 1 each (4 mg total) by mouth as needed for smoking cessation.  100 tablet  0  . pravastatin (PRAVACHOL) 40 MG tablet Take 1 tablet (40 mg total) by mouth daily.  30 tablet  11  . sertraline (ZOLOFT) 100 MG tablet Take 2 tablets (200 mg total) by mouth daily.  60 tablet  3  . traZODone (DESYREL) 100 MG tablet Take 200 mg by mouth at bedtime.       No current facility-administered medications for this visit.   Family History  Problem Relation Age of Onset  . Heart disease Mother 66  . Hypertension Mother   . Heart disease Father 1  . Aneurysm Brother     . Aneurysm Sister   . Colon cancer Neg Hx   . Esophageal cancer Neg Hx   . Stomach cancer Neg Hx    History   Social History  . Marital Status: Divorced    Spouse Name: N/A    Number of Children: N/A  . Years of Education: 12   Occupational History  .  Unemployed   Social History Main Topics  . Smoking status: Current Every Day Smoker -- 0.50 packs/day for 20 years    Types: Cigarettes  . Smokeless tobacco: Current User    Types: Chew     Comment: did not want to discuss any stopping or decreasing  . Alcohol Use: No  . Drug Use: No  . Sexual Activity: Not Currently    Birth Control/ Protection: Surgical   Other Topics Concern  . None   Social History Narrative  . None   Review of Systems:  Constitutional:  Denies fever, chills  HEENT:  Congestion  Respiratory:  DOE  Cardiovascular:  Chest pain and pressure  Gastrointestinal:  Bloating  Genitourinary:  Denies dysuria  Musculoskeletal:  Occasional leg pain  Skin:  Denies pallor, rash and wound.   Neurological:  Denies syncope   Objective:  Physical Exam: Filed Vitals:   10/07/14 0937  BP: 143/87  Pulse: 85  Temp: 98.2 F (36.8 C)  TempSrc: Oral  Weight: 176 lb (79.833 kg)  SpO2: 100%   Vitals reviewed. General: sitting in chair, NAD HEENT: EOMI Cardiac: RRR Pulm: clear to auscultation bilaterally, no wheezes, rales, or rhonchi Abd: soft, nontender, distended, BS present Ext: moving all 4 extremities Neuro: alert and oriented X3  EKG: new TWI aVL and flattening in lead I Assessment & Plan:  Discussed with Dr. Daryll Drown Chest pain--admit to IMTS r/o ACS

## 2014-10-07 NOTE — Assessment & Plan Note (Signed)
Will try to assist with mammogram scheduling. Try to get tdap during hospital admission and refused flu vaccine at this time.

## 2014-10-07 NOTE — Assessment & Plan Note (Addendum)
Substernal, pressures sensation x several weeks but currently ongoing. Associated with DOE and palpitations. Denies any relief with Zantac and has also taken nexium in the past but does not remember if it helped. Also endorses having a stress test in the past but no record found on epic at this time. EKG with new T wave changes in leads I and aVL. RF's : HTN, DM2, Smoking, and family history. TIMI 2. Last echo 2014 with mildly reduced systolic function and grade 1 DD with EF 50-55%.   -admit to IMTS for ACS r/o, discussed with Dr. Heber Weir -cycle CE -will give asa 325mg  x1 now

## 2014-10-07 NOTE — H&P (Signed)
Date: 10/07/2014               Patient Name:  Susan Holmes MRN: [id number]  DOB: [birth year] Age / Sex: 58 y.o., female   PCP: Drucilla Schmidt, MD         Medical Service: Internal Medicine Teaching Service         Attending Physician: Dr. Annia Belt, MD    First Contact: Dr. Marvel Plan Pager: [phone]  Second Contact: Dr. Heber [place] Pager: [phone]       After Hours (After 5p/  First Contact Pager: [phone]  weekends / holidays): Second Contact Pager: [phone]   Chief Complaint: chest pain  History of Present Illness: Susan Holmes is a 58 yo female with PMHx of HTN, Type II DM with neuropathy, HLD, tobacco abuse, and anxiety/depression who presents to the hospital from clinic for chest pain. Patient was seen in clinic by Dr. Eula Fried this morning and complained of ongoing chest pain and palpitations that she describes as "tremors" or "chills." She states it feels like her heart is swimming along and then it will catch in her chest. It feels like it flip flops. This occurs 9-10 times per day but is not associated with nausea, vomiting, shortness of breath, dizziness or syncope. It is associated with pain and pressure that will last for a few minutes. She has tried Zantac in the past without relief and denies any history of GERD. She has DOE at baseline when she walks.   In the clinic, patient had an EKG which showed new T wave changes in Leads I and aVL. Medications Prior to Admission  Medication Sig Dispense Refill  . ACCU-CHEK FASTCLIX LANCETS MISC Check blood sugar 2x a day as instructed dx cod 250.00 insulin requiring  102 each  5  . albuterol-ipratropium (COMBIVENT) 18-103 MCG/ACT inhaler Inhale 2 puffs into the lungs every 4 (four) hours as needed for wheezing or shortness of breath.      Marland Kitchen amLODipine (NORVASC) 5 MG tablet Take 1 tablet (5 mg total) by mouth daily.  30 tablet  1  . aspirin EC 81 MG tablet Take 1 tablet (81 mg total) by mouth daily.  30 tablet  12  . etodolac  (LODINE XL) 400 MG 24 hr tablet Take 1 tablet (400 mg total) by mouth daily.  30 tablet  3  . ferrous sulfate 325 (65 FE) MG tablet Take 325 mg by mouth daily with breakfast.      . gabapentin (NEURONTIN) 300 MG capsule Take 2 capsules (600 mg total) by mouth 3 (three) times daily.  120 capsule  2  . glucose blood (ACCU-CHEK COMPACT STRIPS) test strip Use as instructed to check sugars twice daily  100 each  12  . insulin glargine (LANTUS) 100 UNIT/ML injection Inject 20 Units into the skin at bedtime.      Marland Kitchen LACTOBACILLUS PO Take 1 capsule by mouth daily.      Marland Kitchen loperamide (IMODIUM) 2 MG capsule Take 2 mg by mouth daily as needed for diarrhea or loose stools.      Marland Kitchen losartan (COZAAR) 50 MG tablet Take 50 mg by mouth daily.      . metFORMIN (GLUCOPHAGE) 500 MG tablet Take 500 mg by mouth 2 (two) times daily with a meal.      . mirtazapine (REMERON) 15 MG tablet Take 15 mg by mouth at bedtime.       . naproxen (NAPROSYN) 250 MG tablet Take 1 tablet (250 mg  total) by mouth 2 (two) times daily with a meal.  60 tablet  1  . nicotine polacrilex (NICORETTE) 4 MG gum Take 1 each (4 mg total) by mouth as needed for smoking cessation.  100 tablet  0  . pravastatin (PRAVACHOL) 40 MG tablet Take 1 tablet (40 mg total) by mouth daily.  30 tablet  11  . traZODone (DESYREL) 100 MG tablet Take 250 mg by mouth at bedtime.       . Blood Glucose Monitoring Suppl (ACCU-CHEK AVIVA PLUS) W/DEVICE KIT Use to check blood sugar 3 to 4 times daily. diag code 250.62. Insulin dependent  1 kit  0     Meds: Current Facility-Administered Medications  Medication Dose Route Frequency Provider Last Rate Last Dose  . acetaminophen (TYLENOL) tablet 650 mg  650 mg Oral Q4H PRN Osa Craver, MD      . Derrill Memo ON 10/08/2014] amLODipine (NORVASC) tablet 5 mg  5 mg Oral Daily  Marvel Plan, MD      . Derrill Memo ON 10/08/2014] aspirin EC tablet 81 mg  81 mg Oral Daily  Richardson, MD      . enoxaparin (LOVENOX) injection 40 mg   40 mg Subcutaneous Q24H  Richardson, MD      . etodolac (LODINE XL) 24 hr tablet 400 mg  400 mg Oral Daily  Richardson, MD      . Derrill Memo ON 10/08/2014] ferrous sulfate tablet 325 mg  325 mg Oral Q breakfast  Richardson, MD      . gabapentin (NEURONTIN) capsule 600 mg  600 mg Oral TID Osa Craver, MD      . gi cocktail (Maalox,Lidocaine,Donnatal)  30 mL Oral QID PRN Osa Craver, MD      . insulin aspart (novoLOG) injection 0-15 Units  0-15 Units Subcutaneous TID WC  Richardson, MD      . insulin glargine (LANTUS) injection 20 Units  20 Units Subcutaneous QHS  Richardson, MD      . ipratropium-albuterol (DUONEB) 0.5-2.5 (3) MG/3ML nebulizer solution 3 mL  3 mL Nebulization Q4H PRN Annia Belt, MD      . loperamide (IMODIUM) capsule 2 mg  2 mg Oral Daily PRN Osa Craver, MD      . Derrill Memo ON 10/08/2014] losartan (COZAAR) tablet 50 mg  50 mg Oral Daily  Richardson, MD      . mirtazapine (REMERON) tablet 15 mg  15 mg Oral QHS  Richardson, MD      . naproxen (NAPROSYN) tablet 250 mg  250 mg Oral BID WC  Richardson, MD      . ondansetron (ZOFRAN) injection 4 mg  4 mg Intravenous Q6H PRN Osa Craver, MD      . Derrill Memo ON 10/08/2014] pravastatin (PRAVACHOL) tablet 40 mg  40 mg Oral Daily  Richardson, MD      . traZODone (DESYREL) tablet 250 mg  250 mg Oral QHS Osa Craver, MD        Allergies: Allergies as of 10/07/2014 - Review Complete 10/07/2014  Allergen Reaction Noted  . Ace inhibitors Cough 05/01/2012   Past Medical History  Diagnosis Date  . Hepatitis B infection pt unsure    resolved  . Diabetes mellitus, type II   . HTN (hypertension)   . Hyperlipidemia   . Congenital blindness     L eye  . Tobacco abuse     .5 PPD smoker  . Anxiety   . Depression   . Shortness of breath   . Arthritis  Past Surgical History  Procedure Laterality Date  . Abdominal hysterectomy     Family History  Problem Relation  Age of Onset  . Heart disease Mother 64  . Hypertension Mother   . Heart disease Father 34  . Aneurysm Brother   . Aneurysm Sister   . Colon cancer Neg Hx   . Esophageal cancer Neg Hx   . Stomach cancer Neg Hx    History   Social History  . Marital Status: Divorced    Spouse Name: N/A    Number of Children: N/A  . Years of Education: 12   Occupational History  .  Unemployed   Social History Main Topics  . Smoking status: Current Every Day Smoker -- 0.50 packs/day for 20 years    Types: Cigarettes  . Smokeless tobacco: Current User    Types: Chew     Comment: did not want to discuss any stopping or decreasing  . Alcohol Use: No  . Drug Use: No  . Sexual Activity: Not Currently    Birth Control/ Protection: Surgical   Other Topics Concern  . Not on file   Social History Narrative  . No narrative on file    Review of Systems: General: Denies fever, chills, fatigue, change in appetite and diaphoresis.  Respiratory: Admits to DOE. Denies SOB, cough, and wheezing.   Cardiovascular: Admits to chest pain and palpitations.  Gastrointestinal: Denies nausea, vomiting, abdominal pain, diarrhea, constipation, blood in stool and abdominal distention.  Genitourinary: Denies dysuria, urgency, frequency, hematuria, suprapubic pain and flank pain. Endocrine: Denies hot or cold intolerance, polyuria, and polydipsia. Musculoskeletal: Denies myalgias, back pain, joint swelling, arthralgias and gait problem.  Skin: Denies pallor, rash and wounds.  Neurological: Denies dizziness, headaches, weakness, lightheadedness, numbness,seizures, and syncope, Psychiatric/Behavioral: Denies mood changes, confusion, nervousness, sleep disturbance and agitation.  Physical Exam: Filed Vitals:   10/07/14 1300  Height: 5' 7" (1.702 m)   General: Vital signs reviewed.  Patient is well-developed and well-nourished, in no acute distress and cooperative with exam.  Cardiovascular: RRR, S1 normal, S2  normal, no murmurs, gallops, or rubs. No skipped beats. Tenderness on palpation of right side of chest along sternal border. Pulmonary/Chest: Clear to auscultation bilaterally, no wheezes, rales, or rhonchi. Abdominal: Soft, non-tender, non-distended, BS +, no masses, organomegaly, or guarding present.  Musculoskeletal: No joint deformities, erythema, or stiffness, ROM full and nontender. Extremities: No lower extremity edema bilaterally,  pulses symmetric and intact bilaterally. No cyanosis or clubbing.  Skin: Warm, dry and intact. No rashes or erythema. Psychiatric: Normal mood and affect. speech and behavior is normal. Cognition and memory are normal.   Lab results: Basic Metabolic Panel:  Recent Labs  10/07/14 1400  NA 141  K 4.3  CL 104  CO2 23  GLUCOSE 150*  BUN 10  CREATININE 0.89  CALCIUM 10.1   Liver Function Tests:  Recent Labs  10/07/14 1400  AST 17  ALT 14  ALKPHOS 68  BILITOT 0.3  PROT 6.8  ALBUMIN 3.7   CBC:  Recent Labs  10/07/14 1400  WBC 6.4  NEUTROABS 2.1  HGB 11.7*  HCT 38.1  MCV 70.6*  PLT 218   Cardiac Enzymes:  Recent Labs  10/07/14 1400  TROPONINI <0.30   CBG:  Recent Labs  10/07/14 1225  GLUCAP 112*   Urine Drug Screen: Drugs of Abuse     Component Value Date/Time   LABOPIA NEG 03/28/2012 0955   COCAINSCRNUR NEG 03/28/2012 0955   LABBENZ NEG  03/28/2012 0955   LABBENZ NEG 06/08/2010 2150   AMPHETMU NEG 06/08/2010 2150   LABBARB NEG 03/28/2012 0955     Other results: EKG: normal EKG, normal sinus rhythm. T wave abnormalities in leads aVL and I.  Assessment & Plan by Problem: Active Problems:   Type II diabetes mellitus with neurological manifestations, uncontrolled   TOBACCO ABUSE   Essential hypertension   Chest pain  Chest Pain: Patient presents from clinic with complaint of intermittent chest pain/pressure and feeling like her heart is skipping beats. These episodes have been intermittent  Patient had T wave changes in  leads I and aVL while in clinicTIMI score of 2-3 for > 3 CAD risk factors (HLD, HTN, tobacco use, DM), use of aspirin within 7 days and questionable anginal episodes. While in clinic, patient was found to have T wave changes in leads I and aVL. Patient had an echo on 03/07/13 which showed EF of 50-55% and grade 1 DD. First troponin was negative. Patient is being admitted for chest pain rule out. Her description of symptoms seem more consistent with skipped beats or tachycardia than typical chest pain. This may be induced by her anxiety. Patient has a heart score of 5. We will put her on telemetry, repeat EKG in the morning, and trend troponins. Patient will need ACS risk factor modification as well.  -ASA 325 mg once -ASA 81 mg daily starting tomorrow -Amlodipine 5 mg daily -Losartan 50 mg daily -Pravastatin 40 mg daily -Cardiac diet -Repeat EKG in the am -Trend troponins -Telemetry -Vital signs  Type II DM with Neuropathy: Last hemoglobin A1C 6.5 on 08/15/14. Patient is on Lantus 20 units QHS, metformin 500 mg BID, and gabapentin 600 mg TID at home. -Lantus 20 units QHS -SSI-moderate -CBGs 4 times daily -Cardiac diet  HTN: Mildly elevated at 143/87 in clinic. Patient is on amlodipine 5 mg daily and Cozaar 50 mg daily at home.   -Continue amlodipine 5 mg daily -Continue Cozaar 50 mg daily  HLD: Patient is on pravastatin 40 mg daily at home. Last lipid profile from 02/28/14 showed cholesterol of 266, TG 451, HDL 43, LDL 183. -Continue pravastatin 40 mg daily  Anxiety/Depression: Patient is on Trazodone 250 mg QHS and Remeron 15 mg QHS at home. -Continue Remeron 86m QHS -Continue Trazodone 250 mg QHS  DVT/PE ppx: Lovenox 40 mg SQ daily  Dispo: Disposition is deferred at this time, awaiting improvement of current medical problems. Anticipated discharge in approximately 1-2 day(s).   The patient does have a current PCP (Drucilla Schmidt MD) and does need [facility]  after discharge.  The patient does not have transportation limitations that hinder transportation to clinic appointments.  Signed: AOsa Craver DO PGY-1 Internal Medicine Resident Pager # [phone]/11/2014 4:40 PM

## 2014-10-07 NOTE — Assessment & Plan Note (Signed)
  Assessment: Progress toward smoking cessation:  smoking the same amount (<1 pack per day)   Plan: Instruction/counseling given:  I counseled patient on the dangers of tobacco use, advised patient to stop smoking, and reviewed strategies to maximize success. Educational resources provided:    Self management tools provided:    Medications to assist with smoking cessation:  Nicotine Gum Patient agreed to the following self-care plans for smoking cessation: call [facility] (1-800-QUIT-NOW)  Other plans: still smoking

## 2014-10-07 NOTE — Assessment & Plan Note (Signed)
Medicaid transport form filled out today and will also try to fax PFTs to [facility] per her request

## 2014-10-07 NOTE — Addendum Note (Signed)
Addended by: Marcelino Duster on: 10/07/2014 11:41 AM   Modules accepted: Orders

## 2014-10-07 NOTE — Assessment & Plan Note (Signed)
BP Readings from Last 3 Encounters:  10/07/14 143/87  09/19/14 154/93  09/04/14 169/91   Lab Results  Component Value Date   NA 140 09/19/2014   K 4.3 09/19/2014   CREATININE 0.82 09/19/2014    Assessment: Blood pressure control: mildly elevated Progress toward BP goal:  improved Comments: only taking cozaar 50mg  (not 100mg  as per last note) and amlodipine 5mg   Plan: Medications:  continue current medications continue cozaar 50mg  and amlodipine 5mg  for now and recheck on follow up visit to decide if want to increase to 100mg  as previously recommmended

## 2014-10-08 ENCOUNTER — Observation Stay (HOSPITAL_COMMUNITY): Payer: PRIVATE HEALTH INSURANCE

## 2014-10-08 ENCOUNTER — Encounter (HOSPITAL_COMMUNITY): Payer: Self-pay | Admitting: Cardiology

## 2014-10-08 ENCOUNTER — Other Ambulatory Visit: Payer: Self-pay

## 2014-10-08 DIAGNOSIS — F418 Other specified anxiety disorders: Secondary | ICD-10-CM

## 2014-10-08 DIAGNOSIS — E1149 Type 2 diabetes mellitus with other diabetic neurological complication: Secondary | ICD-10-CM | POA: Diagnosis not present

## 2014-10-08 DIAGNOSIS — F17210 Nicotine dependence, cigarettes, uncomplicated: Secondary | ICD-10-CM | POA: Diagnosis not present

## 2014-10-08 DIAGNOSIS — R079 Chest pain, unspecified: Secondary | ICD-10-CM

## 2014-10-08 DIAGNOSIS — Z720 Tobacco use: Secondary | ICD-10-CM

## 2014-10-08 DIAGNOSIS — E1165 Type 2 diabetes mellitus with hyperglycemia: Secondary | ICD-10-CM | POA: Diagnosis not present

## 2014-10-08 LAB — TROPONIN I: Troponin I: <0.3 ng/mL (ref <0.30)

## 2014-10-08 LAB — GLUCOSE, CAPILLARY
Glucose-Capillary: 132 mg/dL — ABNORMAL HIGH (ref 70–99)
Glucose-Capillary: 131 mg/dL — ABNORMAL HIGH (ref 70–99)
Glucose-Capillary: 164 mg/dL — ABNORMAL HIGH (ref 70–99)
Glucose-Capillary: 133 mg/dL — ABNORMAL HIGH (ref 70–99)

## 2014-10-08 MED ORDER — REGADENOSON 0.4 MG/5ML IV SOLN
INTRAVENOUS | Status: AC
  Administered 2014-10-08: 0.4 mg via INTRAVENOUS
  Filled 2014-10-08: qty 5

## 2014-10-08 MED ORDER — TECHNETIUM TC 99M SESTAMIBI GENERIC - CARDIOLITE
10.0000 | Freq: Once | INTRAVENOUS | Status: AC | PRN
  Administered 2014-10-08: 10 via INTRAVENOUS

## 2014-10-08 MED ORDER — TECHNETIUM TC 99M SESTAMIBI GENERIC - CARDIOLITE
30.0000 | Freq: Once | INTRAVENOUS | Status: AC | PRN
  Administered 2014-10-08: 30 via INTRAVENOUS

## 2014-10-08 MED ORDER — LOSARTAN POTASSIUM 50 MG PO TABS
100.0000 mg | ORAL_TABLET | Freq: Every day | ORAL | Status: DC
  Filled 2014-10-08: qty 2

## 2014-10-08 MED ORDER — POLYETHYLENE GLYCOL 3350 17 G PO PACK
17.0000 g | PACK | Freq: Every day | ORAL | Status: DC | PRN
  Administered 2014-10-08: 17 g via ORAL
  Filled 2014-10-08 (×2): qty 1

## 2014-10-08 MED ORDER — REGADENOSON 0.4 MG/5ML IV SOLN
0.4000 mg | Freq: Once | INTRAVENOUS | Status: AC
  Administered 2014-10-08: 0.4 mg via INTRAVENOUS
  Filled 2014-10-08: qty 5

## 2014-10-08 NOTE — H&P (Signed)
Medicine attending admission note: I personally interviewed and examined this patient and reviewed pertinent lab database an electrocardiogram and I concur with the evaluation and management plan as outlined by resident physician Dr. Osa Craver.  Clinical summary: 58 year old woman with hypertension, type 2 diabetes now on insulin, hyperlipidemia, and chronic anxiety/depression. She is a one pack per day cigarette smoker. She has chronic dyspnea on exertion. She presented for a routine clinic followup visit complaining of ongoing chest pain, pressure, and palpitations occurring 9-10 times per day and usually associated with increased activity. Electrocardiogram in the clinic showed some possible new ST and T wave changes in leads 1 and aVL and she was admitted for further evaluation.  Initial exam by Dr. Marvel Plan: Well-nourished African American woman in no distress. Not currently having any chest pain or palpitations. Main complaint is pain in the right shoulder radiating to the right pectoral area. Regular cardiac rhythm no murmur gallop or rub no arrhythmia. Tenderness on palpation of the right pectoral area along the sternal border.  Pertinent lab: Random glucose 150, BUN 10, creatinine 0.9, white count 6400, hemoglobin 11.7 with MCV 70.6.  Serial troponins undetectable  EKG which I personally reviewed shows normal sinus rhythm, T waves are biphasic in leads 1 and aVL and I am not convinced that there are any significant change compared with prior tracings as far back as 03/07/2013.  Current exam: Pleasant woman in no distress Blood pressure 147/90, pulse 76, temperature 98.7 F (37.1 C), temperature source Oral, resp. rate 18, height 5\' 7"  (1.702 m), weight 176 lb 5.9 oz (80 kg), SpO2 97.00%. No change from exam recorded by Dr. Marvel Plan except on my exam there is significant pain on palpation at the right glenohumeral joint. No pain on rotation at the glenohumeral  joint.  Impression: #1. Intermittent palpitations with no clear signs for acute coronary ischemia. We will follow serial EKGs. We have asked cardiology for an opinion. She will have a Myoview stress test today. Further evaluation based on results of this.  #2. Localized right shoulder pain. Suspect that she may have a rotator cuff injury or degenerative arthritis. Once cardiac status has been better defined, we can evaluate her shoulder problem.  Murriel Hopper, MD, [place]  Hematology-Oncology/Internal Medicine

## 2014-10-08 NOTE — Discharge Summary (Signed)
Name: Susan Holmes MRN: [id number] DOB: 12/15/1956 58 y.o. PCP: Drucilla Schmidt, MD  Date of Admission: 10/07/2014 12:18 PM Date of Discharge: 10/10/2014 Attending Physician: Dr. Beryle Beams  Discharge Diagnosis:  Active Problems:   Type II diabetes mellitus with neurological manifestations, uncontrolled   TOBACCO ABUSE   Essential hypertension   Chest pain   Abnormal stress test  Discharge Medications:   Medication List    STOP taking these medications       pravastatin 40 MG tablet  Commonly known as:  PRAVACHOL      TAKE these medications       ACCU-CHEK AVIVA PLUS W/DEVICE Kit  Use to check blood sugar 3 to 4 times daily. diag code 250.62. Insulin dependent     ACCU-CHEK FASTCLIX LANCETS Misc  Check blood sugar 2x a day as instructed dx cod 250.00 insulin requiring     albuterol-ipratropium 18-103 MCG/ACT inhaler  Commonly known as:  COMBIVENT  Inhale 2 puffs into the lungs every 4 (four) hours as needed for wheezing or shortness of breath.     amLODipine 5 MG tablet  Commonly known as:  NORVASC  Take 1 tablet (5 mg total) by mouth daily.     aspirin EC 81 MG tablet  Take 1 tablet (81 mg total) by mouth daily.     atorvastatin 80 MG tablet  Commonly known as:  LIPITOR  Take 1 tablet (80 mg total) by mouth daily at 6 PM.     diclofenac sodium 1 % Gel  Commonly known as:  VOLTAREN  Apply 2 g topically 4 (four) times daily.     etodolac 400 MG 24 hr tablet  Commonly known as:  LODINE XL  Take 1 tablet (400 mg total) by mouth daily.     ferrous sulfate 325 (65 FE) MG tablet  Take 325 mg by mouth daily with breakfast.     gabapentin 300 MG capsule  Commonly known as:  NEURONTIN  Take 2 capsules (600 mg total) by mouth 3 (three) times daily.     glucose blood test strip  Commonly known as:  ACCU-CHEK COMPACT STRIPS  Use as instructed to check sugars twice daily     insulin glargine 100 UNIT/ML injection  Commonly known as:  LANTUS  Inject 20  Units into the skin at bedtime.     LACTOBACILLUS PO  Take 1 capsule by mouth daily.     loperamide 2 MG capsule  Commonly known as:  IMODIUM  Take 2 mg by mouth daily as needed for diarrhea or loose stools.     losartan 50 MG tablet  Commonly known as:  COZAAR  Take 50 mg by mouth daily.     metFORMIN 500 MG tablet  Commonly known as:  GLUCOPHAGE  Take 500 mg by mouth 2 (two) times daily with a meal.     mirtazapine 15 MG tablet  Commonly known as:  REMERON  Take 15 mg by mouth at bedtime.     naproxen 250 MG tablet  Commonly known as:  NAPROSYN  Take 1 tablet (250 mg total) by mouth 2 (two) times daily with a meal.     nicotine polacrilex 4 MG gum  Commonly known as:  NICORETTE  Take 1 each (4 mg total) by mouth as needed for smoking cessation.     pantoprazole 40 MG tablet  Commonly known as:  PROTONIX  Take 1 tablet (40 mg total) by mouth daily.     traZODone  100 MG tablet  Commonly known as:  DESYREL  Take 250 mg by mouth at bedtime.        Disposition and follow-up:   Susan Holmes was discharged from [facility] in Good condition.  At the hospital follow up visit please address:  1.  Resolution of Chest Pain (GERD versus MSK since cardiac workup unremarkable--see below): Please assess if PPI or voltaren gel have been helping symptoms. Please address risk factor modification of smoking cessation, cholesterol, hypertension, diet, weight loss, exercise, and diabetes control.   2.  Labs / imaging needed at time of follow-up: None  3.  Pending labs/ test needing follow-up: None  Follow-up Appointments: Follow-up Information   Follow up with Jerene Pitch, MD On 10/17/2014. (9:15 am)    Specialty:  Internal Medicine   Contact information:   [place] [address] [phone]       Follow up with Kirk Ruths, MD. Schedule an appointment as soon as possible for a visit in 1 month.   Specialty:  Cardiology   Contact  information:   [address] [place] [address] [phone]      Discharge Instructions   Diet - low sodium heart healthy    Complete by:  As directed      Discharge instructions    Complete by:  As directed   Please start taking Protonix 25m daily (this may help your chest pain), Take Atorvastatin 826minstead of Pravastatin 4030maily, and try Voltaren gel on your shoulder.     Increase activity slowly    Complete by:  As directed           Consultations: Treatment Team:  Rounding [facility], MD  Admission HPI: [name] a [age] female with PMHx of HTN, Type II DM with neuropathy, HLD, tobacco abuse, and anxiety/depression who presents to the hospital from clinic for chest pain. Patient was seen in clinic by Dr. QurEula Friedis morning and complained of ongoing chest pain and palpitations that she describes as "tremors" or "chills." She states it feels like her heart is swimming along and then it will catch in her chest. It feels like it flip flops. This occurs 9-10 times per day but is not associated with nausea, vomiting, shortness of breath, dizziness or syncope. It is associated with pain and pressure that will last for a few minutes. She has tried Zantac in the past without relief and denies any history of GERD. She has DOE at baseline when she walks. In the clinic, patient had an EKG which showed new T wave changes in Leads I and aVL.  Hospital Course by problem list: Active Problems:   Type II diabetes mellitus with neurological manifestations, uncontrolled   TOBACCO ABUSE   Essential hypertension   Chest pain   Abnormal stress test   Chest Pain: Patient presented from clinic with complaint of intermittent chest pain/pressure and feeling like her heart is skipping beats. These episodes have been intermittent over the past couple months. Patient had T wave changes in leads I and aVL while in clinic and has a TIMI score of 2-3 for > 3 CAD risk factors (HLD,  HTN, tobacco use, DM), use of aspirin within 7 days and questionable anginal episodes. While in clinic, patient was found to have T wave changes in leads I and aVL. Patient had an echo on 03/07/13 which showed EF of 50-55% and grade 1 DD. Patient has a heart  score of 5. Patient received ASA 325 mg, ASA 81 mg daily, amlodipine 5 mg daily, losartan 50 mg daily and pravastatin 40 mg daily. We put her on telemetry, repeated an EKG in the morning, and trended troponins. Troponins were negative x 3 and repeat EKG showed no signs of ischemia or infarction. Patient was seen on chest pain rounds and had a nuclear stress test with Lexiscan performed which showed intermediate risk nuclear stress test with a small-sized, mild intensity fixed apical attenuation artifact, but no reversible ischemia. Also, global hypokinesis with EF 38%. Cardiology recommended getting an echocardiogram. Repeat Echo showed Systolic function was mildly reduced. The estimated ejection fraction was in the range of 45% to 50%. Diffuse hypokinesis. Patient was taken to Cardiac catheterization revealed mild nonobstructive coronary artery disease with preserved left ventricular ejection fraction indicating false positive nuclear stress test. The cause of her chest pain therefore still remains unclear. Based on the ASCVD risk calculator she has a 50% chance of having a vascular event in the next 10 years. We switched her to atorvastatin 80 mg daily and counseled her on the importance of smoking cessation, diabetes control and diet modificaton. Patient will follow up with our [facility] clinic and follow up outpatient with cardiology. She was discharged on amlodipine 5 mg daily, asa 81 mg daily, atorvastatin 80 mg daily, losartan 50 mg daily, and empiric trial of pantoprazole 40 mg daily.   Type II DM with Neuropathy: Last hemoglobin A1C 6.5 on 08/15/14. Patient is on Lantus 20 units QHS, metformin 500 mg BID, and gabapentin 600 mg TID at home. Patient was managed  with Lantus 20 units QHS and SSI-moderate during stay and her glucose was well controlled. We continued her home medications on discharge.   HTN: Blood pressure was mildly elevated at 143/87 in clinic. Patient is on amlodipine 5 mg daily and Cozaar 50 mg daily at home. Blood pressure remained mildly elevated at 121/68-156/66. She was supposed to be on Cozaar 100 mg daily at home, but was only taking 50. Please consider increasing cozaar to 100 mg daily as outpatient if blood pressure can tolerate.  Calcified Right Supraspinatus Tendinitis: Patient has a history of chronic right shoulder pain worse with movement and lifting heavy objects. She has received physical therapy and cortisone shots for it in the past. R shoulder xray showed calcified tendinitis of the supraspinatus tendon. Patient is already on etodolac 400 mg daily and naproxen 250 mg BID at home. We prescribed voltaren gel on discharge.  HLD: Patient is on pravastatin 40 mg daily at home. Last lipid profile from 02/28/14 showed cholesterol of 266, TG 451, HDL 43, LDL 183. We switched patient to atorvastatin 80 mg daily (see above).   Anxiety/Depression: Patient is on Trazodone 250 mg QHS and Remeron 15 mg QHS at home. Remained stable and we continued these medications on discharge and during hospital stay.  Discharge Vitals:   BP 141/78  Pulse 65  Temp(Src) 98.6 F (37 C) (Oral)  Resp 18  Ht 5' 7" (1.702 m)  Wt 79.2 kg (174 lb 9.7 oz)  BMI 27.34 kg/m2  SpO2 100%  Discharge Labs:  Results for orders placed during the hospital encounter of 10/07/14 (from the past 24 hour(s))  GLUCOSE, CAPILLARY     Status: Abnormal   Collection Time    10/09/14  4:28 PM      Result Value Ref Range   Glucose-Capillary 160 (*) 70 - 99 mg/dL    Signed: Osa Craver, DO  PGY-1 Internal Medicine Resident Pager # [phone] 10/10/2014 4:15 PM

## 2014-10-08 NOTE — Consult Note (Signed)
Primary cardiologist: New  HPI: 58 year old female with past medical history of diabetes mellitus, hypertension, hyperlipidemia, tobacco abuse for evaluation of chest pain. Patient had an echocardiogram in March of 2014 showed an ejection fraction of 50-55%. There was grade 1 diastolic dysfunction and mild left atrial enlargement. Question hypokinesis of the mid and basal anterolateral wall. ABIs in October 2015 normal at rest. Patient has had intermittent chest pain for years. The pain begins in the right chest with radiation to the left. It occurs both with exertion and at rest. It lasts several minutes and resolve spontaneously. No associated symptoms. Not pleuritic or positional and not related to food. She has some dyspnea on exertion but no orthopnea or PND. No pedal edema. She does not have exertional chest pain. Admitted with the above symptoms and cardiology asked to evaluate.  Medications Prior to Admission  Medication Sig Dispense Refill  . ACCU-CHEK FASTCLIX LANCETS MISC Check blood sugar 2x a day as instructed dx cod 250.00 insulin requiring  102 each  5  . albuterol-ipratropium (COMBIVENT) 18-103 MCG/ACT inhaler Inhale 2 puffs into the lungs every 4 (four) hours as needed for wheezing or shortness of breath.      Marland Kitchen amLODipine (NORVASC) 5 MG tablet Take 1 tablet (5 mg total) by mouth daily.  30 tablet  1  . aspirin EC 81 MG tablet Take 1 tablet (81 mg total) by mouth daily.  30 tablet  12  . etodolac (LODINE XL) 400 MG 24 hr tablet Take 1 tablet (400 mg total) by mouth daily.  30 tablet  3  . ferrous sulfate 325 (65 FE) MG tablet Take 325 mg by mouth daily with breakfast.      . gabapentin (NEURONTIN) 300 MG capsule Take 2 capsules (600 mg total) by mouth 3 (three) times daily.  120 capsule  2  . glucose blood (ACCU-CHEK COMPACT STRIPS) test strip Use as instructed to check sugars twice daily  100 each  12  . insulin glargine (LANTUS) 100 UNIT/ML injection Inject 20 Units into the  skin at bedtime.      Marland Kitchen LACTOBACILLUS PO Take 1 capsule by mouth daily.      Marland Kitchen loperamide (IMODIUM) 2 MG capsule Take 2 mg by mouth daily as needed for diarrhea or loose stools.      Marland Kitchen losartan (COZAAR) 50 MG tablet Take 50 mg by mouth daily.      . metFORMIN (GLUCOPHAGE) 500 MG tablet Take 500 mg by mouth 2 (two) times daily with a meal.      . mirtazapine (REMERON) 15 MG tablet Take 15 mg by mouth at bedtime.       . naproxen (NAPROSYN) 250 MG tablet Take 1 tablet (250 mg total) by mouth 2 (two) times daily with a meal.  60 tablet  1  . nicotine polacrilex (NICORETTE) 4 MG gum Take 1 each (4 mg total) by mouth as needed for smoking cessation.  100 tablet  0  . pravastatin (PRAVACHOL) 40 MG tablet Take 1 tablet (40 mg total) by mouth daily.  30 tablet  11  . traZODone (DESYREL) 100 MG tablet Take 250 mg by mouth at bedtime.       . Blood Glucose Monitoring Suppl (ACCU-CHEK AVIVA PLUS) W/DEVICE KIT Use to check blood sugar 3 to 4 times daily. diag code 250.62. Insulin dependent  1 kit  0    Allergies  Allergen Reactions  . Ace Inhibitors Cough    Past Medical History  Diagnosis Date  . Hepatitis B infection pt unsure    resolved  . Diabetes mellitus, type II   . HTN (hypertension)   . Hyperlipidemia   . Congenital blindness     L eye  . Tobacco abuse     .5 PPD smoker  . Anxiety   . Depression   . Arthritis     Past Surgical History  Procedure Laterality Date  . Abdominal hysterectomy      History   Social History  . Marital Status: Divorced    Spouse Name: N/A    Number of Children: 1  . Years of Education: 12   Occupational History  .  Unemployed   Social History Main Topics  . Smoking status: Current Every Day Smoker -- 0.50 packs/day for 20 years    Types: Cigarettes  . Smokeless tobacco: Current User    Types: Chew     Comment: did not want to discuss any stopping or decreasing  . Alcohol Use: No  . Drug Use: No  . Sexual Activity: Not Currently     Birth Control/ Protection: Surgical   Other Topics Concern  . Not on file   Social History Narrative  . No narrative on file    Family History  Problem Relation Age of Onset  . Heart disease Mother 66  . Hypertension Mother   . Heart disease Father 15  . Aneurysm Brother   . Aneurysm Sister   . Colon cancer Neg Hx   . Esophageal cancer Neg Hx   . Stomach cancer Neg Hx     ROS:  Arthralgias and congenital blindness left eye but no fevers or chills, productive cough, hemoptysis, dysphasia, odynophagia, melena, hematochezia, dysuria, hematuria, rash, seizure activity, orthopnea, PND, pedal edema, claudication. Remaining systems are negative.  Physical Exam:   Blood pressure 121/68, pulse 65, temperature 97.7 F (36.5 C), temperature source Oral, resp. rate 18, height '5\' 7"'  (1.702 m), weight 176 lb 5.9 oz (80 kg), SpO2 95.00%.  General:  Well developed/well nourished in NAD Skin warm/dry Patient not depressed No peripheral clubbing Back-normal HEENT-normal/normal eyelids Neck supple/normal carotid upstroke bilaterally; no bruits; no JVD; no thyromegaly chest - CTA/ normal expansion CV - RRR/normal S1 and S2; no murmurs, rubs or gallops;  PMI nondisplaced Abdomen -NT/ND, no HSM, no mass, + bowel sounds, no bruit 2+ femoral pulses, no bruits Ext-no edema, chords, 2+ DP Neuro-grossly nonfocal  ECG Normal sinus rhythm with nonspecific ST changes.  Results for orders placed during the hospital encounter of 10/07/14 (from the past 48 hour(s))  GLUCOSE, CAPILLARY     Status: Abnormal   Collection Time    10/07/14 12:25 PM      Result Value Ref Range   Glucose-Capillary 112 (*) 70 - 99 mg/dL  CBC WITH DIFFERENTIAL     Status: Abnormal   Collection Time    10/07/14  2:00 PM      Result Value Ref Range   WBC 6.4  4.0 - 10.5 K/uL   RBC 5.40 (*) 3.87 - 5.11 MIL/uL   Hemoglobin 11.7 (*) 12.0 - 15.0 g/dL   HCT 38.1  36.0 - 46.0 %   MCV 70.6 (*) 78.0 - 100.0 fL   MCH 21.7 (*)  26.0 - 34.0 pg   MCHC 30.7  30.0 - 36.0 g/dL   RDW 16.2 (*) 11.5 - 15.5 %   Platelets 218  150 - 400 K/uL   Comment: PLATELET COUNT CONFIRMED BY SMEAR  REPEATED TO VERIFY   Neutrophils Relative % 33 (*) 43 - 77 %   Lymphocytes Relative 57 (*) 12 - 46 %   Monocytes Relative 5  3 - 12 %   Eosinophils Relative 5  0 - 5 %   Basophils Relative 0  0 - 1 %   Neutro Abs 2.1  1.7 - 7.7 K/uL   Lymphs Abs 3.7  0.7 - 4.0 K/uL   Monocytes Absolute 0.3  0.1 - 1.0 K/uL   Eosinophils Absolute 0.3  0.0 - 0.7 K/uL   Basophils Absolute 0.0  0.0 - 0.1 K/uL   RBC Morphology POLYCHROMASIA PRESENT     Comment: ELLIPTOCYTES   WBC Morphology ATYPICAL LYMPHOCYTES     Smear Review LARGE PLATELETS PRESENT    COMPREHENSIVE METABOLIC PANEL     Status: Abnormal   Collection Time    10/07/14  2:00 PM      Result Value Ref Range   Sodium 141  137 - 147 mEq/L   Potassium 4.3  3.7 - 5.3 mEq/L   Chloride 104  96 - 112 mEq/L   CO2 23  19 - 32 mEq/L   Glucose, Bld 150 (*) 70 - 99 mg/dL   BUN 10  6 - 23 mg/dL   Creatinine, Ser 0.89  0.50 - 1.10 mg/dL   Calcium 10.1  8.4 - 10.5 mg/dL   Total Protein 6.8  6.0 - 8.3 g/dL   Albumin 3.7  3.5 - 5.2 g/dL   AST 17  0 - 37 U/L   ALT 14  0 - 35 U/L   Alkaline Phosphatase 68  39 - 117 U/L   Total Bilirubin 0.3  0.3 - 1.2 mg/dL   GFR calc non Af Amer 70 (*) >90 mL/min   GFR calc Af Amer 81 (*) >90 mL/min   Comment: (NOTE)     The eGFR has been calculated using the CKD EPI equation.     This calculation has not been validated in all clinical situations.     eGFR's persistently <90 mL/min signify possible Chronic Kidney     Disease.   Anion gap 14  5 - 15  TROPONIN I     Status: None   Collection Time    10/07/14  2:00 PM      Result Value Ref Range   Troponin I <0.30  <0.30 ng/mL   Comment:            Due to the release kinetics of cTnI,     a negative result within the first hours     of the onset of symptoms does not rule out     myocardial infarction with  certainty.     If myocardial infarction is still suspected,     repeat the test at appropriate intervals.  GLUCOSE, CAPILLARY     Status: Abnormal   Collection Time    10/07/14  4:48 PM      Result Value Ref Range   Glucose-Capillary 127 (*) 70 - 99 mg/dL  TROPONIN I     Status: None   Collection Time    10/07/14  6:37 PM      Result Value Ref Range   Troponin I <0.30  <0.30 ng/mL   Comment:            Due to the release kinetics of cTnI,     a negative result within the first hours     of the onset of symptoms  does not rule out     myocardial infarction with certainty.     If myocardial infarction is still suspected,     repeat the test at appropriate intervals.  GLUCOSE, CAPILLARY     Status: Abnormal   Collection Time    10/07/14  9:23 PM      Result Value Ref Range   Glucose-Capillary 109 (*) 70 - 99 mg/dL  TROPONIN I     Status: None   Collection Time    10/08/14 12:51 AM      Result Value Ref Range   Troponin I <0.30  <0.30 ng/mL   Comment:            Due to the release kinetics of cTnI,     a negative result within the first hours     of the onset of symptoms does not rule out     myocardial infarction with certainty.     If myocardial infarction is still suspected,     repeat the test at appropriate intervals.  GLUCOSE, CAPILLARY     Status: Abnormal   Collection Time    10/08/14  7:40 AM      Result Value Ref Range   Glucose-Capillary 132 (*) 70 - 99 mg/dL    No results found.  Assessment/Plan 1 chest pain-symptoms atypical but multiple risk factors including 20 years of diabetes mellitus, hypertension, hyperlipidemia, tobacco abuse and family history. She is ruled out. We will arrange nuclear study for risk stratification. 2 tobacco abuse-patient counseled on discontinuing. 3 hypertension-continue present blood pressure medications. 4 hyperlipidemia-continue statin. 5 microcytic anemia-further evaluation per primary care.  Kirk Ruths MD 10/08/2014,  8:34 AM

## 2014-10-08 NOTE — Progress Notes (Signed)
UR completed 

## 2014-10-08 NOTE — Progress Notes (Signed)
Subjective:  Patient was seen and examined this afternoon. Patient complains of right shoulder pain which is chronic in nature and hurts when she reaches across her body. She denies any recent injury. Patient has the constant feeling of a fish swimming in her chest and get caught up. Not associated with shortness of breath or nausea. Patient cannot characterize the pain any more than this.   Objective: Vital signs in last 24 hours: Filed Vitals:   10/08/14 1037 10/08/14 1039 10/08/14 1041 10/08/14 1334  BP: 134/90 156/66 139/78 147/90  Pulse:    76  Temp:    98.7 F (37.1 C)  TempSrc:    Oral  Resp:    18  Height:      Weight:      SpO2:    97%   Weight change:   Intake/Output Summary (Last 24 hours) at 10/08/14 1502 Last data filed at 10/07/14 2000  Gross per 24 hour  Intake    240 ml  Output      0 ml  Net    240 ml   General: Vital signs reviewed. Patient is well-developed and well-nourished, in no acute distress and cooperative with exam.  Cardiovascular: RRR, S1 normal, S2 normal, no murmurs, gallops, or rubs. No skipped beats. Tenderness on palpation of right side of chest along sternal border.  Pulmonary/Chest: Clear to auscultation bilaterally, no wheezes, rales, or rhonchi.  Abdominal: Soft, non-tender, non-distended, BS +, no masses, organomegaly, or guarding present.  Musculoskeletal: Pain with palpation of the right shoulder. Normal strength. No joint deformities, erythema, or stiffness, ROM full and nontender.  Extremities: No lower extremity edema bilaterally, pulses symmetric and intact bilaterally. No cyanosis or clubbing.  Skin: Warm, dry and intact. No rashes or erythema. Psychiatric: Normal mood and affect. speech and behavior is normal. Cognition and memory are normal.   Lab Results: Basic Metabolic Panel:  Recent Labs Lab 10/07/14 1400  NA 141  K 4.3  CL 104  CO2 23  GLUCOSE 150*  BUN 10  CREATININE 0.89  CALCIUM 10.1   Liver Function  Tests:  Recent Labs Lab 10/07/14 1400  AST 17  ALT 14  ALKPHOS 68  BILITOT 0.3  PROT 6.8  ALBUMIN 3.7   CBC:  Recent Labs Lab 10/07/14 1400  WBC 6.4  NEUTROABS 2.1  HGB 11.7*  HCT 38.1  MCV 70.6*  PLT 218   Cardiac Enzymes:  Recent Labs Lab 10/07/14 1400 10/07/14 1837 10/08/14 0051  TROPONINI <0.30 <0.30 <0.30   CBG:  Recent Labs Lab 10/07/14 1225 10/07/14 1648 10/07/14 2123 10/08/14 0740 10/08/14 1255  GLUCAP 112* 127* 109* 132* 131*   Medications:  I have reviewed the patient's current medications. Prior to Admission:  Prescriptions prior to admission  Medication Sig Dispense Refill  . ACCU-CHEK FASTCLIX LANCETS MISC Check blood sugar 2x a day as instructed dx cod 250.00 insulin requiring  102 each  5  . albuterol-ipratropium (COMBIVENT) 18-103 MCG/ACT inhaler Inhale 2 puffs into the lungs every 4 (four) hours as needed for wheezing or shortness of breath.      Marland Kitchen amLODipine (NORVASC) 5 MG tablet Take 1 tablet (5 mg total) by mouth daily.  30 tablet  1  . aspirin EC 81 MG tablet Take 1 tablet (81 mg total) by mouth daily.  30 tablet  12  . etodolac (LODINE XL) 400 MG 24 hr tablet Take 1 tablet (400 mg total) by mouth daily.  30 tablet  3  .  ferrous sulfate 325 (65 FE) MG tablet Take 325 mg by mouth daily with breakfast.      . gabapentin (NEURONTIN) 300 MG capsule Take 2 capsules (600 mg total) by mouth 3 (three) times daily.  120 capsule  2  . glucose blood (ACCU-CHEK COMPACT STRIPS) test strip Use as instructed to check sugars twice daily  100 each  12  . insulin glargine (LANTUS) 100 UNIT/ML injection Inject 20 Units into the skin at bedtime.      Marland Kitchen LACTOBACILLUS PO Take 1 capsule by mouth daily.      Marland Kitchen loperamide (IMODIUM) 2 MG capsule Take 2 mg by mouth daily as needed for diarrhea or loose stools.      Marland Kitchen losartan (COZAAR) 50 MG tablet Take 50 mg by mouth daily.      . metFORMIN (GLUCOPHAGE) 500 MG tablet Take 500 mg by mouth 2 (two) times daily with  a meal.      . mirtazapine (REMERON) 15 MG tablet Take 15 mg by mouth at bedtime.       . naproxen (NAPROSYN) 250 MG tablet Take 1 tablet (250 mg total) by mouth 2 (two) times daily with a meal.  60 tablet  1  . nicotine polacrilex (NICORETTE) 4 MG gum Take 1 each (4 mg total) by mouth as needed for smoking cessation.  100 tablet  0  . pravastatin (PRAVACHOL) 40 MG tablet Take 1 tablet (40 mg total) by mouth daily.  30 tablet  11  . traZODone (DESYREL) 100 MG tablet Take 250 mg by mouth at bedtime.       . Blood Glucose Monitoring Suppl (ACCU-CHEK AVIVA PLUS) W/DEVICE KIT Use to check blood sugar 3 to 4 times daily. diag code 250.62. Insulin dependent  1 kit  0   Scheduled Meds: . amLODipine  5 mg Oral Daily  . aspirin EC  81 mg Oral Daily  . enoxaparin (LOVENOX) injection  40 mg Subcutaneous Q24H  . ferrous sulfate  325 mg Oral Q breakfast  . gabapentin  600 mg Oral TID  . insulin aspart  0-15 Units Subcutaneous TID WC  . insulin glargine  20 Units Subcutaneous QHS  . losartan  50 mg Oral Daily  . mirtazapine  15 mg Oral QHS  . naproxen  250 mg Oral BID WC  . pravastatin  40 mg Oral Daily  . traZODone  250 mg Oral QHS   Continuous Infusions:  PRN Meds:.acetaminophen, gi cocktail, ipratropium-albuterol, loperamide, ondansetron (ZOFRAN) IV Assessment/Plan: Active Problems:   Type II diabetes mellitus with neurological manifestations, uncontrolled   TOBACCO ABUSE   Essential hypertension   Chest pain  Chest Pain: Patient had a nuclear stress test today and Lexiscan which she tolerated well. Results are pending. Troponins were negative and EKG shows normal sinus rhythm without evidence of ischemia or infarction. Patient will be discharge home with outpatient follow up depending on the results of the scan. -ASA 81 mg daily  -Amlodipine 5 mg daily  -Losartan 50 mg daily  -Pravastatin 40 mg daily  -Cardiac diet  -Telemetry  -Vital signs   Type II DM with Neuropathy: Last hemoglobin  A1C 6.5 on 08/15/14. Patient is on Lantus 20 units QHS, metformin 500 mg BID, and gabapentin 600 mg TID at home. Patient's glucose has been well controlled.  -Lantus 20 units QHS  -SSI-moderate  -CBGs 4 times daily  -Cardiac diet   HTN: Mildly elevated at 121/68-156/66. Patient is on amlodipine 5 mg daily and Cozaar  50 mg daily at home. Patient will likely need an increase her medications. She was supposed to be on Cozaar 100 mg daily at home, but was only taking 50.  -Continue amlodipine 5 mg daily  -Continue Cozaar 50 mg daily   HLD: Patient is on pravastatin 40 mg daily at home. Last lipid profile from 02/28/14 showed cholesterol of 266, TG 451, HDL 43, LDL 183.  -Continue pravastatin 40 mg daily (moderate intensity statin)  Microcytic Anemia: Hemoglobin 11.7 with MCV 70.6. Baseline hemoglobin 13. Anemia profile in February 2015 showed iron 85, TIBC 301, ferritin 40, folate 8.7. Patient is on ferrous sulfate at home 325 mg daily.  -Outpatient work up  DVT/PE ppx: Lovenox 40 mg SQ daily  Dispo: Disposition is deferred at this time, awaiting improvement of current medical problems.  Anticipated discharge in approximately 1-2 day(s).   The patient does have a current PCP Drucilla Schmidt, MD) and does need an [facility] hospital follow-up appointment after discharge.  The patient does not have transportation limitations that hinder transportation to clinic appointments.  .Services Needed at time of discharge: Y = Yes, Blank = No PT:   OT:   RN:   Equipment:   Other:     LOS: 1 day   Osa Craver, DO PGY-1 Internal Medicine Resident Pager # [phone] 10/08/2014 3:02 PM

## 2014-10-08 NOTE — Progress Notes (Signed)
The patient tolerated the Deputy well.  Results pending.  Encarnacion Bole, PA-C

## 2014-10-08 NOTE — Progress Notes (Signed)
Internal Medicine Clinic Attending  Case discussed with Dr. Eula Fried at the time of the visit.  We reviewed the resident's history and exam and pertinent patient test results.  I agree with the assessment, diagnosis, and plan of care documented in the resident's note.  Patient with risk factors, some typical features of chest pain, but mostly atypical.  She will require ACS rule out.

## 2014-10-09 ENCOUNTER — Encounter (HOSPITAL_COMMUNITY): Payer: Self-pay | Admitting: Interventional Cardiology

## 2014-10-09 ENCOUNTER — Encounter (HOSPITAL_COMMUNITY)
Admission: AD | Disposition: A | Discharge status: Home / Self Care | Payer: Self-pay | Source: Ambulatory Visit | Attending: Oncology

## 2014-10-09 DIAGNOSIS — R9439 Abnormal result of other cardiovascular function study: Secondary | ICD-10-CM

## 2014-10-09 DIAGNOSIS — R079 Chest pain, unspecified: Secondary | ICD-10-CM | POA: Diagnosis not present

## 2014-10-09 DIAGNOSIS — E785 Hyperlipidemia, unspecified: Secondary | ICD-10-CM

## 2014-10-09 DIAGNOSIS — R072 Precordial pain: Secondary | ICD-10-CM

## 2014-10-09 DIAGNOSIS — M7531 Calcific tendinitis of right shoulder: Secondary | ICD-10-CM

## 2014-10-09 DIAGNOSIS — I10 Essential (primary) hypertension: Secondary | ICD-10-CM

## 2014-10-09 HISTORY — PX: LEFT HEART CATHETERIZATION WITH CORONARY ANGIOGRAM: SHX5451

## 2014-10-09 LAB — GLUCOSE, CAPILLARY
Glucose-Capillary: 124 mg/dL — ABNORMAL HIGH (ref 70–99)
Glucose-Capillary: 128 mg/dL — ABNORMAL HIGH (ref 70–99)
Glucose-Capillary: 160 mg/dL — ABNORMAL HIGH (ref 70–99)

## 2014-10-09 LAB — CBC
HCT: 40 % (ref 36.0–46.0)
Hemoglobin: 12.4 g/dL (ref 12.0–15.0)
MCH: 21.5 pg — ABNORMAL LOW (ref 26.0–34.0)
MCHC: 31 g/dL (ref 30.0–36.0)
MCV: 69.2 fL — ABNORMAL LOW (ref 78.0–100.0)
Platelets: 216 10*3/uL (ref 150–400)
RBC: 5.78 MIL/uL — ABNORMAL HIGH (ref 3.87–5.11)
RDW: 15.9 % — ABNORMAL HIGH (ref 11.5–15.5)
WBC: 5.9 10*3/uL (ref 4.0–10.5)

## 2014-10-09 LAB — PROTIME-INR
INR: 0.99 (ref 0.00–1.49)
Prothrombin Time: 13.1 seconds (ref 11.6–15.2)

## 2014-10-09 LAB — CREATININE, SERUM
Creatinine, Ser: 0.77 mg/dL (ref 0.50–1.10)
GFR calc Af Amer: >90 mL/min (ref >90)
GFR calc non Af Amer: >90 mL/min (ref >90)

## 2014-10-09 SURGERY — LEFT HEART CATHETERIZATION WITH CORONARY ANGIOGRAM
Anesthesia: LOCAL

## 2014-10-09 MED ORDER — SODIUM CHLORIDE 0.9 % IV SOLN
INTRAVENOUS | Status: AC
  Administered 2014-10-09: 16:00:00 via INTRAVENOUS

## 2014-10-09 MED ORDER — ENOXAPARIN SODIUM 40 MG/0.4ML ~~LOC~~ SOLN
40.0000 mg | SUBCUTANEOUS | Status: DC
  Filled 2014-10-09: qty 0.4

## 2014-10-09 MED ORDER — SODIUM CHLORIDE 0.9 % IJ SOLN: 3.0000 mL | Freq: Two times a day (BID) | INTRAMUSCULAR | Status: DC

## 2014-10-09 MED ORDER — ATORVASTATIN CALCIUM 80 MG PO TABS: 80.0000 mg | ORAL_TABLET | Freq: Every day | ORAL | Status: DC

## 2014-10-09 MED ORDER — SODIUM CHLORIDE 0.9 % IJ SOLN: 3.0000 mL | INTRAMUSCULAR | Status: DC | PRN

## 2014-10-09 MED ORDER — FENTANYL CITRATE 0.05 MG/ML IJ SOLN
INTRAMUSCULAR | Status: AC
  Filled 2014-10-09: qty 2

## 2014-10-09 MED ORDER — MIDAZOLAM HCL 2 MG/2ML IJ SOLN
INTRAMUSCULAR | Status: AC
  Filled 2014-10-09: qty 2

## 2014-10-09 MED ORDER — ASPIRIN 81 MG PO CHEW: 81.0000 mg | CHEWABLE_TABLET | ORAL | Status: DC

## 2014-10-09 MED ORDER — HEPARIN (PORCINE) IN NACL 2-0.9 UNIT/ML-% IJ SOLN
INTRAMUSCULAR | Status: AC
  Filled 2014-10-09: qty 1000

## 2014-10-09 MED ORDER — DICLOFENAC SODIUM 1 % TD GEL: 2.0000 g | Freq: Four times a day (QID) | TRANSDERMAL | Status: DC

## 2014-10-09 MED ORDER — HEPARIN SODIUM (PORCINE) 1000 UNIT/ML IJ SOLN
INTRAMUSCULAR | Status: AC
  Filled 2014-10-09: qty 1

## 2014-10-09 MED ORDER — PANTOPRAZOLE SODIUM 40 MG PO TBEC: 40.0000 mg | DELAYED_RELEASE_TABLET | Freq: Every day | ORAL | Status: DC

## 2014-10-09 MED ORDER — VERAPAMIL HCL 2.5 MG/ML IV SOLN
INTRAVENOUS | Status: AC
  Filled 2014-10-09: qty 2

## 2014-10-09 MED ORDER — ONDANSETRON HCL 4 MG/2ML IJ SOLN: 4.0000 mg | Freq: Four times a day (QID) | INTRAMUSCULAR | Status: DC | PRN

## 2014-10-09 MED ORDER — LIDOCAINE HCL (PF) 1 % IJ SOLN
INTRAMUSCULAR | Status: AC
  Filled 2014-10-09: qty 30

## 2014-10-09 MED ORDER — NITROGLYCERIN 1 MG/10 ML FOR IR/CATH LAB
INTRA_ARTERIAL | Status: AC
  Filled 2014-10-09: qty 10

## 2014-10-09 MED ORDER — ATORVASTATIN CALCIUM 80 MG PO TABS
80.0000 mg | ORAL_TABLET | Freq: Every day | ORAL | Status: DC
  Administered 2014-10-09: 80 mg via ORAL
  Filled 2014-10-09 (×2): qty 1

## 2014-10-09 MED ORDER — SODIUM CHLORIDE 0.9 % IV SOLN: 250.0000 mL | INTRAVENOUS | Status: DC | PRN

## 2014-10-09 MED ORDER — ACETAMINOPHEN 325 MG PO TABS: 650.0000 mg | ORAL_TABLET | ORAL | Status: DC | PRN

## 2014-10-09 NOTE — Discharge Instructions (Signed)
·   Thank you for allowing [place] to be involved in your healthcare while you were hospitalized at [facility].   Please note that there have been changes to your home medications.  --> PLEASE LOOK AT YOUR DISCHARGE MEDICATION LIST FOR DETAILS.  Please call your PCP if you have any questions or concerns, or any difficulty getting any of your medications.  Please return to the ER if you have worsening of your symptoms or new severe symptoms arise.  I want you to start taking Atorvastatin 80mg  a day and STOP taking Pravastatin 40mg  a day (this is for cholesterol and to prevent a heart Attack)  For you shoulder I have prescribed Voltaren Gel I want you to try this on your shoulder.  I want you to take 40mg  of Protonix to see if it helps your chest pain (it can take a few days to work) if it does work this may mean that your chest pain was coming from your esophagus/stomach.

## 2014-10-09 NOTE — Progress Notes (Signed)
Patient having 6/10 CP prior to cath and sedation.  Abnormal stress test.

## 2014-10-09 NOTE — Progress Notes (Signed)
Patient ID: Susan Holmes, female   DOB: 04-21-1956, 58 y.o.   MRN: [id number]    Subjective:  Denies SSCP, palpitations or Dyspnea Having echo  Objective:  Filed Vitals:   10/08/14 1041 10/08/14 1334 10/08/14 2100 10/09/14 0547  BP: 139/78 147/90 135/86 145/92  Pulse:  76 73 68  Temp:  98.7 F (37.1 C) 98.3 F (36.8 C) 97.7 F (36.5 C)  TempSrc:  Oral Oral Oral  Resp:  18 18 18   Height:      Weight:    174 lb 9.7 oz (79.2 kg)  SpO2:  97% 95% 98%    Intake/Output from previous day:  Intake/Output Summary (Last 24 hours) at 10/09/14 0818 Last data filed at 10/08/14 1700  Gross per 24 hour  Intake    500 ml  Output      0 ml  Net    500 ml    Physical Exam: Affect appropriate Healthy:  appears stated age HEENT: normal Neck supple with no adenopathy JVP normal no bruits no thyromegaly Lungs clear with no wheezing and good diaphragmatic motion Heart:  S1/S2 no murmur, no rub, gallop or click PMI normal Abdomen: benighn, BS positve, no tenderness, no AAA no bruit.  No HSM or HJR Distal pulses intact with no bruits No edema Neuro non-focal Skin warm and dry No muscular weakness   Lab Results: Basic Metabolic Panel:  Recent Labs  10/07/14 1400  NA 141  K 4.3  CL 104  CO2 23  GLUCOSE 150*  BUN 10  CREATININE 0.89  CALCIUM 10.1   Liver Function Tests:  Recent Labs  10/07/14 1400  AST 17  ALT 14  ALKPHOS 68  BILITOT 0.3  PROT 6.8  ALBUMIN 3.7   CBC:  Recent Labs  10/07/14 1400  WBC 6.4  NEUTROABS 2.1  HGB 11.7*  HCT 38.1  MCV 70.6*  PLT 218   Cardiac Enzymes:  Recent Labs  10/07/14 1400 10/07/14 1837 10/08/14 0051  TROPONINI <0.30 <0.30 <0.30    Imaging: Dg Shoulder Right  10/08/2014   CLINICAL DATA:  Chronic right shoulder pain without injury.  EXAM: RIGHT SHOULDER - 2+ VIEW  COMPARISON:  None.  FINDINGS: There is no evidence of fracture or dislocation. Joint spaces appear intact. Calcification is seen over the greater  tuberosity suggesting calcific tendonitis of the supraspinatus tendon. Soft tissues are unremarkable.  IMPRESSION: Probable calcific tendinitis of the supraspinatus tendon. No other significant abnormality seen in the right shoulder.   Electronically Signed   By: Sabino Dick M.D.   On: 10/08/2014 16:14   Nm Myocar Multi W/spect W/wall Motion / Ef  10/08/2014   HISTORY OF PRESENT ILLNESS: Nuclear Med Background  Indication for Stress Test:  Chest pain  History: 58 year old female with past medical history of diabetes mellitus, hypertension, hyperlipidemia, tobacco abuse for evaluation of chest pain. Patient had an echocardiogram in March of 2014 showed an ejection fraction of 50-55%. There was grade 1 diastolic dysfunction and mild left atrial enlargement. Question hypokinesis of the mid and basal anterolateral wall. ABIs in October 2015 normal at rest. Patient has had intermittent chest pain for years. The pain begins in the right chest with radiation to the left. It occurs both with exertion and at rest. It lasts several minutes and resolve spontaneously. No associated symptoms. Not pleuritic or positional and not related to food. She has some dyspnea on exertion but no orthopnea or PND. No pedal edema. She does not have exertional chest  pain. Admitted with the above symptoms and cardiology asked to evaluate.  Cardiac Risk Factors:  DM2, HTN, dyslipidemia, tobacco abuse  Symptoms:  Chest pain  PROCEDURE: Nuclear Pre-Procedure  Caffeine/Decaff Intake:  NPO After: MN.  Lungs:  O2 Stat:  IV 0.9% NS with Angio Cath:  Chest Size (in):  Cup Size:  Height: 5'7"  Weight:  176 lb  BMI:  Tech Comments:  Nuclear Med Study  1 or 2 day study: 1  Stress Test Type: Lexiscan  Reading MD: Hilty  Order Authorizing Provider: Crenshaw  Resting Radionuclide: Sestamibi  Resting Radionuclide Dose:  10 mCi  Stress Radionuclide: Sestamibi  Stress Radionuclide Dose:  30 mCi  Stress Protocol  Rest HR: 65  Stress HR: 93  Rest BP: 143/89   Stress BP: 139/78  Exercise Time (min):  METS:  Dose of Adenosine (mg):  Dose of Lexiscan:  0.4 mg  Dose of Atropine (mg):  Dose of Dobutamine:  Stress Test Technologist:  Nuclear Technologist:  Rest Procedure:  Stress Procedure:  Transient Ischemic Dilatation (Normal <1.22): 1.09  Lung/Heart Ratio (Normal <0.45): 0.26  QGS EDV:  89 ml  QGS ESV:  55 ml  LV Ejection Fraction: 38%  Rest ECG: NSR  Stress ECG: NSR  Raw Data Images:  No artifacts  Stress Images:  Small fixed apical defect  Rest Images:  Small fixed apical defect  Subtraction (SDS): 1  IMPRESSION: Exercise Capacity: N/A  BP Response: normal  Clinical Symptoms:  Headache, dyspnea  ECG Impression:  No lexiscan EKG changes  Comparison with Prior Nuclear Study: None  Final Impression:  Intermediate risk nuclear stress test with a small-sized, mild intensity fixed apical attenuation artifact. No reversible ischemia. Global hypokinesis with EF 38%.   Electronically Signed   By: Pixie Casino   On: 10/08/2014 15:03    Cardiac Studies:  ECG:  SR nonspecific ST changes   Telemetry: NSR no VT  Echo:  Pending   Nuclear:  Final Impression:  Intermediate risk nuclear stress test with a small-sized, mild  intensity fixed apical attenuation artifact. No reversible ischemia.  Global hypokinesis with EF 38%.   Medications:   . amLODipine  5 mg Oral Daily  . aspirin EC  81 mg Oral Daily  . enoxaparin (LOVENOX) injection  40 mg Subcutaneous Q24H  . ferrous sulfate  325 mg Oral Q breakfast  . gabapentin  600 mg Oral TID  . insulin aspart  0-15 Units Subcutaneous TID WC  . insulin glargine  20 Units Subcutaneous QHS  . losartan  100 mg Oral Daily  . mirtazapine  15 mg Oral QHS  . naproxen  250 mg Oral BID WC  . pravastatin  40 mg Oral Daily  . traZODone  250 mg Oral QHS       Assessment/Plan:  Hypercholesterolemia:  Continue statin HTN:  Continue ARB  Change calcium blocker to beta blocker if she has CAD Chest Pain:  Abnormal myovue with  decreased EF  Echo pending favor diagnostic cath given risk factors Discussed with patient Willing to proceed  Lab called and orders written  Jenkins Rouge 10/09/2014, 8:18 AM

## 2014-10-09 NOTE — Progress Notes (Signed)
*  PRELIMINARY RESULTS* Echocardiogram 2D Echocardiogram has been performed.  Leavy Cella 10/09/2014, 8:13 AM

## 2014-10-09 NOTE — CV Procedure (Signed)
       PROCEDURE:  Left heart catheterization with selective coronary angiography, left ventriculogram.  INDICATIONS:  Abnormal stress test  The risks, benefits, and details of the procedure were explained to the patient.  The patient verbalized understanding and wanted to proceed.  Informed written consent was obtained.  PROCEDURE TECHNIQUE:  After Xylocaine anesthesia a 76F slender sheath was placed in the right radial artery with a single anterior needle wall stick.   IV heparin was given. Right coronary angiography was done using a Judkins R4 guide catheter.  Left coronary angiography was done using a Judkins L3.5 guide catheter.  Left ventriculography was done using a pigtail catheter.  A TR band was used for hemostasis.   CONTRAST:  Total of 70 cc.  COMPLICATIONS:  None.    HEMODYNAMICS:  Aortic pressure was 143/72; LV pressure was 146/7; LVEDP 10.  There was no gradient between the left ventricle and aorta.    ANGIOGRAPHIC DATA:   The left main coronary artery is widely patent.  The left anterior descending artery is a large vessel which wraps around the apex. In the proximal vessel, there is mild, eccentric plaque noted. It appears worse in the caudal views..  The left circumflex artery is a large vessel. The first obtuse marginal is widely patent. The second obtuse marginal is a large vessel and widely patent.  The right coronary artery is a large, dominant vessel. There is mild disease in the proximal vessel. The posterior lateral artery is medium size with mild, proximal disease. The Posterior descending artery is small.Marland Kitchen  LEFT VENTRICULOGRAM:  Left ventricular angiogram was done in the 30 RAO projection and revealed normal left ventricular wall motion and systolic function with an estimated ejection fraction of 55 %.  LVEDP was 10 mmHg.  IMPRESSIONS:  1. Widely patent left main coronary artery. 2. Mild disease in the left anterior descending artery and its  branches. 3. Mild disease in the left circumflex artery and its branches. 4. Mild proximal disease in the right coronary artery. 5. Normal left ventricular systolic function.  LVEDP 10 mmHg.  Ejection fraction 55%.  RECOMMENDATION:  False-positive nuclear stress test. I stressed the importance of risk factor modification including stopping smoking and controlling her diabetes.  Continue blood pressure control and lipid lowering therapy as well.  Cardiology followup with Dr. Stanford Breed.

## 2014-10-09 NOTE — Progress Notes (Signed)
Patient removed telemetry staff attempted to reapply leads patient refused, saying "Dr said I did not have to wear that any longer" MD notified.

## 2014-10-09 NOTE — H&P (View-Only) (Signed)
Patient ID: Susan Holmes, female   DOB: June 12, 1956, 58 y.o.   MRN: [id number]    Subjective:  Denies SSCP, palpitations or Dyspnea Having echo  Objective:  Filed Vitals:   10/08/14 1041 10/08/14 1334 10/08/14 2100 10/09/14 0547  BP: 139/78 147/90 135/86 145/92  Pulse:  76 73 68  Temp:  98.7 F (37.1 C) 98.3 F (36.8 C) 97.7 F (36.5 C)  TempSrc:  Oral Oral Oral  Resp:  18 18 18   Height:      Weight:    174 lb 9.7 oz (79.2 kg)  SpO2:  97% 95% 98%    Intake/Output from previous day:  Intake/Output Summary (Last 24 hours) at 10/09/14 0818 Last data filed at 10/08/14 1700  Gross per 24 hour  Intake    500 ml  Output      0 ml  Net    500 ml    Physical Exam: Affect appropriate Healthy:  appears stated age HEENT: normal Neck supple with no adenopathy JVP normal no bruits no thyromegaly Lungs clear with no wheezing and good diaphragmatic motion Heart:  S1/S2 no murmur, no rub, gallop or click PMI normal Abdomen: benighn, BS positve, no tenderness, no AAA no bruit.  No HSM or HJR Distal pulses intact with no bruits No edema Neuro non-focal Skin warm and dry No muscular weakness   Lab Results: Basic Metabolic Panel:  Recent Labs  10/07/14 1400  NA 141  K 4.3  CL 104  CO2 23  GLUCOSE 150*  BUN 10  CREATININE 0.89  CALCIUM 10.1   Liver Function Tests:  Recent Labs  10/07/14 1400  AST 17  ALT 14  ALKPHOS 68  BILITOT 0.3  PROT 6.8  ALBUMIN 3.7   CBC:  Recent Labs  10/07/14 1400  WBC 6.4  NEUTROABS 2.1  HGB 11.7*  HCT 38.1  MCV 70.6*  PLT 218   Cardiac Enzymes:  Recent Labs  10/07/14 1400 10/07/14 1837 10/08/14 0051  TROPONINI <0.30 <0.30 <0.30    Imaging: Dg Shoulder Right  10/08/2014   CLINICAL DATA:  Chronic right shoulder pain without injury.  EXAM: RIGHT SHOULDER - 2+ VIEW  COMPARISON:  None.  FINDINGS: There is no evidence of fracture or dislocation. Joint spaces appear intact. Calcification is seen over the greater  tuberosity suggesting calcific tendonitis of the supraspinatus tendon. Soft tissues are unremarkable.  IMPRESSION: Probable calcific tendinitis of the supraspinatus tendon. No other significant abnormality seen in the right shoulder.   Electronically Signed   By: Sabino Dick M.D.   On: 10/08/2014 16:14   Nm Myocar Multi W/spect W/wall Motion / Ef  10/08/2014   HISTORY OF PRESENT ILLNESS: Nuclear Med Background  Indication for Stress Test:  Chest pain  History: 58 year old female with past medical history of diabetes mellitus, hypertension, hyperlipidemia, tobacco abuse for evaluation of chest pain. Patient had an echocardiogram in March of 2014 showed an ejection fraction of 50-55%. There was grade 1 diastolic dysfunction and mild left atrial enlargement. Question hypokinesis of the mid and basal anterolateral wall. ABIs in October 2015 normal at rest. Patient has had intermittent chest pain for years. The pain begins in the right chest with radiation to the left. It occurs both with exertion and at rest. It lasts several minutes and resolve spontaneously. No associated symptoms. Not pleuritic or positional and not related to food. She has some dyspnea on exertion but no orthopnea or PND. No pedal edema. She does not have exertional chest  pain. Admitted with the above symptoms and cardiology asked to evaluate.  Cardiac Risk Factors:  DM2, HTN, dyslipidemia, tobacco abuse  Symptoms:  Chest pain  PROCEDURE: Nuclear Pre-Procedure  Caffeine/Decaff Intake:  NPO After: MN.  Lungs:  O2 Stat:  IV 0.9% NS with Angio Cath:  Chest Size (in):  Cup Size:  Height: 5'7"  Weight:  176 lb  BMI:  Tech Comments:  Nuclear Med Study  1 or 2 day study: 1  Stress Test Type: Lexiscan  Reading MD: Hilty  Order Authorizing Provider: Crenshaw  Resting Radionuclide: Sestamibi  Resting Radionuclide Dose:  10 mCi  Stress Radionuclide: Sestamibi  Stress Radionuclide Dose:  30 mCi  Stress Protocol  Rest HR: 65  Stress HR: 93  Rest BP: 143/89   Stress BP: 139/78  Exercise Time (min):  METS:  Dose of Adenosine (mg):  Dose of Lexiscan:  0.4 mg  Dose of Atropine (mg):  Dose of Dobutamine:  Stress Test Technologist:  Nuclear Technologist:  Rest Procedure:  Stress Procedure:  Transient Ischemic Dilatation (Normal <1.22): 1.09  Lung/Heart Ratio (Normal <0.45): 0.26  QGS EDV:  89 ml  QGS ESV:  55 ml  LV Ejection Fraction: 38%  Rest ECG: NSR  Stress ECG: NSR  Raw Data Images:  No artifacts  Stress Images:  Small fixed apical defect  Rest Images:  Small fixed apical defect  Subtraction (SDS): 1  IMPRESSION: Exercise Capacity: N/A  BP Response: normal  Clinical Symptoms:  Headache, dyspnea  ECG Impression:  No lexiscan EKG changes  Comparison with Prior Nuclear Study: None  Final Impression:  Intermediate risk nuclear stress test with a small-sized, mild intensity fixed apical attenuation artifact. No reversible ischemia. Global hypokinesis with EF 38%.   Electronically Signed   By: Pixie Casino   On: 10/08/2014 15:03    Cardiac Studies:  ECG:  SR nonspecific ST changes   Telemetry: NSR no VT  Echo:  Pending   Nuclear:  Final Impression:  Intermediate risk nuclear stress test with a small-sized, mild  intensity fixed apical attenuation artifact. No reversible ischemia.  Global hypokinesis with EF 38%.   Medications:   . amLODipine  5 mg Oral Daily  . aspirin EC  81 mg Oral Daily  . enoxaparin (LOVENOX) injection  40 mg Subcutaneous Q24H  . ferrous sulfate  325 mg Oral Q breakfast  . gabapentin  600 mg Oral TID  . insulin aspart  0-15 Units Subcutaneous TID WC  . insulin glargine  20 Units Subcutaneous QHS  . losartan  100 mg Oral Daily  . mirtazapine  15 mg Oral QHS  . naproxen  250 mg Oral BID WC  . pravastatin  40 mg Oral Daily  . traZODone  250 mg Oral QHS       Assessment/Plan:  Hypercholesterolemia:  Continue statin HTN:  Continue ARB  Change calcium blocker to beta blocker if she has CAD Chest Pain:  Abnormal myovue with  decreased EF  Echo pending favor diagnostic cath given risk factors Discussed with patient Willing to proceed  Lab called and orders written  Jenkins Rouge 10/09/2014, 8:18 AM

## 2014-10-09 NOTE — Progress Notes (Signed)
UR completed 

## 2014-10-09 NOTE — Interval H&P Note (Signed)
Cath Lab Visit (complete for each Cath Lab visit)  Clinical Evaluation Leading to the Procedure:   ACS: Yes.    Non-ACS:    Anginal Classification: CCS IV  Anti-ischemic medical therapy: Minimal Therapy (1 class of medications)  Non-Invasive Test Results: Intermediate-risk stress test findings: cardiac mortality 1-3%/year  Prior CABG: No previous CABG      History and Physical Interval Note:  10/09/2014 2:51 PM  Susan Holmes  has presented today for surgery, with the diagnosis of cp  The various methods of treatment have been discussed with the patient and family. After consideration of risks, benefits and other options for treatment, the patient has consented to  Procedure(Holmes): LEFT HEART CATHETERIZATION WITH CORONARY ANGIOGRAM (N/A) as a surgical intervention .  The patient'Holmes history has been reviewed, patient examined, no change in status, stable for surgery.  I have reviewed the patient'Holmes chart and labs.  Questions were answered to the patient'Holmes satisfaction.     Susan Holmes.

## 2014-10-09 NOTE — Progress Notes (Signed)
Internal Medicine Attending  Date: 10/09/2014  Patient name: Susan Holmes Medical record number: [id number] Date of birth: [birth year] Age: 58 y.o. Gender: female  I saw and evaluated the patient. I reviewed the resident's note by Dr. Marvel Plan and I agree with the resident's findings and plans as documented in her progress note.  Cardiac catheterization revealed mild nonobstructive coronary artery disease with preserved left ventricular ejection fraction. The cause of her chest pain therefore remains unclear. She may benefit from an empiric trial of a PPI. More importantly is aggressive risk factor modification. Based on the ACC/AHA risk calculator she has a 50% chance of having a vascular event in the next 10 years. The results of the cardiac cath are encouraging, but this does not change her risk for a cerebrovascular event. We will therefore switch her statin to high intensity and have counseled her on the importance of smoking cessation, diabetic control, and weight loss. She is not yet ready to make the necessary lifestyle changes to impact upon any of these risks, but we encouraged her to think about them and contact her primary care provider when she is ready for assistance with smoking cessation and other lifestyle modifications. She is ready for discharge once cleared from a post cardiac cath standpoint. Followup will be with her primary care provider for further education and encouragement for lifestyle modifications.

## 2014-10-09 NOTE — Progress Notes (Signed)
Subjective:  Patient was seen and examined this afternoon. Patient states she continues to have the intermittent chest pain, but denies nausea, vomiting or shortness of breath. Patient was upset with me for miscommunication about the cath today, but after talking with her, she understood the plan. Patient is willing to stay for the catheterization.   Objective: Vital signs in last 24 hours: Filed Vitals:   10/08/14 1041 10/08/14 1334 10/08/14 2100 10/09/14 0547  BP: 139/78 147/90 135/86 145/92  Pulse:  76 73 68  Temp:  98.7 F (37.1 C) 98.3 F (36.8 C) 97.7 F (36.5 C)  TempSrc:  Oral Oral Oral  Resp:  _0 Height:      Weight:    79.2 kg (174 lb 9.7 oz)  SpO2:  97% 95% 98%   Weight change: -0.8 kg (-1 lb 12.2 oz)  Intake/Output Summary (Last 24 hours) at 10/09/14 1128 Last data filed at 10/08/14 1700  Gross per 24 hour  Intake    500 ml  Output      0 ml  Net    500 ml   General: Vital signs reviewed. Patient is well-developed and well-nourished, in no acute distress and cooperative with exam.  Cardiovascular: RRR, S1 normal, S2 normal, no murmurs, gallops, or rubs.  Pulmonary/Chest: Clear to auscultation bilaterally, no wheezes, rales, or rhonchi.  Abdominal: Soft, non-tender, non-distended, BS +, no masses, organomegaly, or guarding present.  Musculoskeletal: Pain with palpation of the right shoulder. Normal strength. No joint deformities, erythema, or stiffness, ROM full and nontender.  Extremities: No lower extremity edema bilaterally, pulses symmetric and intact bilaterally. No cyanosis or clubbing.  Skin: Warm, dry and intact. No rashes or erythema. Psychiatric: Normal mood and affect. speech and behavior is normal. Cognition and memory are normal.   Lab Results: Basic Metabolic Panel:  Recent Labs Lab 10/07/14 1400  NA 141  K 4.3  CL 104  CO2 23  GLUCOSE 150*  BUN 10  CREATININE 0.89  CALCIUM 10.1   Liver Function Tests:  Recent Labs Lab  10/07/14 1400  AST 17  ALT 14  ALKPHOS 68  BILITOT 0.3  PROT 6.8  ALBUMIN 3.7   CBC:  Recent Labs Lab 10/07/14 1400  WBC 6.4  NEUTROABS 2.1  HGB 11.7*  HCT 38.1  MCV 70.6*  PLT 218   Cardiac Enzymes:  Recent Labs Lab 10/07/14 1400 10/07/14 1837 10/08/14 0051  TROPONINI <0.30 <0.30 <0.30   CBG:  Recent Labs Lab 10/07/14 2123 10/08/14 0740 10/08/14 1255 10/08/14 1630 10/08/14 2102 10/09/14 0732  GLUCAP 109* 132* 131* 164* 133* 124*   Medications:  I have reviewed the patient's current medications. Prior to Admission:  Prescriptions prior to admission  Medication Sig Dispense Refill  . ACCU-CHEK FASTCLIX LANCETS MISC Check blood sugar 2x a day as instructed dx cod 250.00 insulin requiring  102 each  5  . albuterol-ipratropium (COMBIVENT) 18-103 MCG/ACT inhaler Inhale 2 puffs into the lungs every 4 (four) hours as needed for wheezing or shortness of breath.      Marland Kitchen amLODipine (NORVASC) 5 MG tablet Take 1 tablet (5 mg total) by mouth daily.  30 tablet  1  . aspirin EC 81 MG tablet Take 1 tablet (81 mg total) by mouth daily.  30 tablet  12  . etodolac (LODINE XL) 400 MG 24 hr tablet Take 1 tablet (400 mg total) by mouth daily.  30 tablet  3  . ferrous sulfate 325 (65 FE) MG  tablet Take 325 mg by mouth daily with breakfast.      . gabapentin (NEURONTIN) 300 MG capsule Take 2 capsules (600 mg total) by mouth 3 (three) times daily.  120 capsule  2  . glucose blood (ACCU-CHEK COMPACT STRIPS) test strip Use as instructed to check sugars twice daily  100 each  12  . insulin glargine (LANTUS) 100 UNIT/ML injection Inject 20 Units into the skin at bedtime.      Marland Kitchen LACTOBACILLUS PO Take 1 capsule by mouth daily.      Marland Kitchen loperamide (IMODIUM) 2 MG capsule Take 2 mg by mouth daily as needed for diarrhea or loose stools.      Marland Kitchen losartan (COZAAR) 50 MG tablet Take 50 mg by mouth daily.      . metFORMIN (GLUCOPHAGE) 500 MG tablet Take 500 mg by mouth 2 (two) times daily with a  meal.      . mirtazapine (REMERON) 15 MG tablet Take 15 mg by mouth at bedtime.       . naproxen (NAPROSYN) 250 MG tablet Take 1 tablet (250 mg total) by mouth 2 (two) times daily with a meal.  60 tablet  1  . nicotine polacrilex (NICORETTE) 4 MG gum Take 1 each (4 mg total) by mouth as needed for smoking cessation.  100 tablet  0  . pravastatin (PRAVACHOL) 40 MG tablet Take 1 tablet (40 mg total) by mouth daily.  30 tablet  11  . traZODone (DESYREL) 100 MG tablet Take 250 mg by mouth at bedtime.       . Blood Glucose Monitoring Suppl (ACCU-CHEK AVIVA PLUS) W/DEVICE KIT Use to check blood sugar 3 to 4 times daily. diag code 250.62. Insulin dependent  1 kit  0   Scheduled Meds: . amLODipine  5 mg Oral Daily  . aspirin  81 mg Oral Pre-Cath  . aspirin EC  81 mg Oral Daily  . atorvastatin  80 mg Oral q1800  . enoxaparin (LOVENOX) injection  40 mg Subcutaneous Q24H  . ferrous sulfate  325 mg Oral Q breakfast  . gabapentin  600 mg Oral TID  . insulin aspart  0-15 Units Subcutaneous TID WC  . insulin glargine  20 Units Subcutaneous QHS  . losartan  100 mg Oral Daily  . mirtazapine  15 mg Oral QHS  . naproxen  250 mg Oral BID WC  . sodium chloride  3 mL Intravenous Q12H  . traZODone  250 mg Oral QHS   Continuous Infusions:  PRN Meds:.sodium chloride, acetaminophen, gi cocktail, ipratropium-albuterol, loperamide, ondansetron (ZOFRAN) IV, polyethylene glycol, sodium chloride Assessment/Plan: Active Problems:   Type II diabetes mellitus with neurological manifestations, uncontrolled   TOBACCO ABUSE   Essential hypertension   Chest pain  Chest Pain: Patient had a nuclear stress test yesterday and Lexiscan which she tolerated well. Results showed an intermediate risk test with small sized mild intensity fixed apical attenuation artifact, but nor reversible ischemia. There was also global hypokinesis with EF of 30%. I spoke with cardiology who recommended repeat Echo to assess EF. There have been  no notable events on telemetry. Repeat echo showed EF of 40-45% which is reduced from prior. Cardiology would like patient to go to cath today. Patient is agreeable with cath. AHA/ACS risk factors showed 50 chance of MI or CVA within the next 10 years. We discussed risk factor modification today as well and patient seemed hesitant with changes. -Cath today -ASA 81 mg daily  -Amlodipine 5 mg daily (  we will change this to beta blocker if patient has CAD)  -Losartan 50 mg daily  -Atorvastatin 80 mg daily -NPO for cath -Vital signs   Calcified Right Supraspinatus Tendinitis: Patient has a history of chronic right shoulder pain worse with movement and lifting heavy objects. She has received physical therapy and cortisone shots for it in the past. R shoulder xray showed calcified tendinitis of the supraspinatus tendon. I will research treatment for her pain and recommendations for outpatient therapy. -Consider outpatient therapy   Type II DM with Neuropathy: Last hemoglobin A1C 6.5 on 08/15/14. Patient is on Lantus 20 units QHS, metformin 500 mg BID, and gabapentin 600 mg TID at home. Patient's glucose has been well controlled.  -Lantus 20 units QHS  -SSI-moderate  -CBGs 4 times daily  -Cardiac diet   HTN: Mildly elevated at 135/86 to 145/92. Patient is on amlodipine 5 mg daily and Cozaar 50 mg daily at home. I increased her cozaar to 100 mg daily yesterday. This is the dose that was previously prescribed for her.  -Continue amlodipine 5 mg daily  -Continue Cozaar 100 mg daily   DVT/PE ppx: Lovenox 40 mg SQ daily  Dispo: Disposition is deferred at this time, awaiting improvement of current medical problems.  Anticipated discharge in approximately 1-2 day(s).   The patient does have a current PCP Drucilla Schmidt, MD) and does need an [facility] hospital follow-up appointment after discharge.  The patient does not have transportation limitations that hinder transportation to clinic  appointments.  .Services Needed at time of discharge: Y = Yes, Blank = No PT:   OT:   RN:   Equipment:   Other:     LOS: 2 days   Osa Craver, DO PGY-1 Internal Medicine Resident Pager # [phone] 10/09/2014 11:28 AM

## 2014-10-10 NOTE — Discharge Summary (Signed)
Medicine attending and discharge note: I personally evaluated and examined this patient today prior to planned discharge and I tested the accuracy of the evaluation and discharge plan as outlined by resident physician Dr. Osa Craver. 58 year old hypertensive, diabetic, smoker, who presented for a routine followup outpatient visit complaining of chest pain, chest pressure, and palpitations. There were questionable acute changes on electrocardiogram and she was admitted for further evaluation. There were no changes on serial EKGs. Serial troponins did not elevate. Main area of pain seemed to be localized to her right shoulder and I suspect that she has a rotator cuff injury which can be further evaluated as an outpatient.. As a precaution, she was seen in consultation by cardiology and a nuclear medicine stress test was done. This was interpreted as showing intermediate risk for ischemia as well as global hypokinesis with estimated ejection fraction 30%. Echocardiogram correlated fairly well with estimated ejection fraction 45-50%. Cardiac catheterization was then performed which showed preserved left ventricular ejection fraction suggesting a false positive result on the nuclear medicine stress test. Additional findings were mild, nonobstructive, coronary artery disease. She was put on an increased dose statin. She was counseled on smoking cessation, diet modification, and better diabetes control. No arrhythmias were documented despite her initial complaints of palpitations. She is discharged in stable condition and will continue to followup in our internal medicine clinic  Murriel Hopper, MD, [place]  Hematology-Oncology/Internal Medicine

## 2014-10-11 ENCOUNTER — Other Ambulatory Visit: Payer: Self-pay | Admitting: *Deleted

## 2014-10-11 MED ORDER — INSULIN GLARGINE 100 UNIT/ML ~~LOC~~ SOLN: 20.0000 [IU] | Freq: Every day | SUBCUTANEOUS | Status: DC

## 2014-10-11 MED ORDER — GLUCOSE BLOOD VI STRP: 1.0000 | ORAL_STRIP | Freq: Two times a day (BID) | Status: DC | PRN

## 2014-10-11 NOTE — Telephone Encounter (Signed)
Pt lost her accu check meter and request new test strips.  Please delete the accu chek test strips and meter.

## 2014-10-14 ENCOUNTER — Telehealth: Payer: Self-pay | Admitting: *Deleted

## 2014-10-14 NOTE — Telephone Encounter (Signed)
Received PA request from pt's pharmacy for her Voltaren 1% gel, which was prescribed for right shoulder pain.  Request submitted over the phone with [facility], case sent for review-decision within 24-72 hours.Despina Hidden Cassady10/19/20152:01 PM    [facility] [phone] Pt ID# [id number] Ref# [id number]

## 2014-10-15 ENCOUNTER — Telehealth: Payer: Self-pay | Admitting: Cardiology

## 2014-10-16 NOTE — Telephone Encounter (Signed)
Closed encounter °

## 2014-10-17 ENCOUNTER — Encounter: Payer: Self-pay | Admitting: Internal Medicine

## 2014-10-17 ENCOUNTER — Ambulatory Visit (INDEPENDENT_AMBULATORY_CARE_PROVIDER_SITE_OTHER): Payer: PRIVATE HEALTH INSURANCE | Admitting: Internal Medicine

## 2014-10-17 VITALS — BP 139/80 | HR 74 | Temp 98.2°F | Wt 178.6 lb

## 2014-10-17 DIAGNOSIS — Z0000 Encounter for general adult medical examination without abnormal findings: Secondary | ICD-10-CM

## 2014-10-17 DIAGNOSIS — Z720 Tobacco use: Secondary | ICD-10-CM

## 2014-10-17 DIAGNOSIS — I10 Essential (primary) hypertension: Secondary | ICD-10-CM

## 2014-10-17 DIAGNOSIS — E1149 Type 2 diabetes mellitus with other diabetic neurological complication: Secondary | ICD-10-CM

## 2014-10-17 DIAGNOSIS — E1165 Type 2 diabetes mellitus with hyperglycemia: Secondary | ICD-10-CM

## 2014-10-17 DIAGNOSIS — Z748 Other problems related to care provider dependency: Secondary | ICD-10-CM

## 2014-10-17 DIAGNOSIS — IMO0002 Reserved for concepts with insufficient information to code with codable children: Secondary | ICD-10-CM

## 2014-10-17 DIAGNOSIS — R079 Chest pain, unspecified: Secondary | ICD-10-CM

## 2014-10-17 DIAGNOSIS — F17200 Nicotine dependence, unspecified, uncomplicated: Secondary | ICD-10-CM

## 2014-10-17 LAB — GLUCOSE, CAPILLARY: Glucose-Capillary: 148 mg/dL — ABNORMAL HIGH (ref 70–99)

## 2014-10-17 MED ORDER — ETODOLAC ER 400 MG PO TB24: 800.0000 mg | ORAL_TABLET | Freq: Every day | ORAL | Status: DC

## 2014-10-17 MED ORDER — NICOTINE 7 MG/24HR TD PT24
7.0000 mg | MEDICATED_PATCH | Freq: Every day | TRANSDERMAL | Status: DC
Start: 2014-10-17 — End: 2014-11-13

## 2014-10-17 NOTE — Assessment & Plan Note (Signed)
Worked up to be non-obstructive CAD per cath 09/2014 1. Widely patent left main coronary artery. 2. Mild disease in the left anterior descending artery and its branches. 3. Mild disease in the left circumflex artery and its branches. 4. Mild proximal disease in the right coronary artery. 5. Normal left ventricular systolic function. LVEDP 10 mmHg. Ejection fraction 55%.  Risk factor modification will be key.  a1c at goal of 6.5, htn BP was 139/80 with current regimen today, now taking lipitor.  Unfortunately still smoking but trying to cut back. Will follow up with cards early November.   CP appears MSK in origin at this time and given hospital work up.  TTP at site of chest pain on exam today but says radiation now to left side claiming soreness.  She was prescribed voltaren gel in the hospital but denied by insurance. Therefore, will favor continuing NSAIDs but increase dose.   -increase home etodolac to 800mg  qd (from 400mg  qd), try this for a month and reassess with pcp follow up in 1 month.

## 2014-10-17 NOTE — Patient Instructions (Signed)
General Instructions:  Please bring your medicines with you each time you come to clinic.  Medicines may include prescription medications, over-the-counter medications, herbal remedies, eye drops, vitamins, or other pills.  Please consider smoking cessation, we can try to the patches that will be prescribed today  Pharmacy should call you if voltaren gel is approved  Please follow up with Dr. Stanford Breed next month as scheduled  Progress Toward Treatment Goals:  Treatment Goal 10/17/2014  Hemoglobin A1C at goal  Blood pressure at goal  Stop smoking smoking less  Prevent falls -    Self Care Goals & Plans:  Self Care Goal 10/17/2014  Manage my medications take my medicines as prescribed; refill my medications on time; bring my medications to every visit  Monitor my health bring my glucose meter and log to each visit  Eat healthy foods eat foods that are low in salt; eat baked foods instead of fried foods  Be physically active find an activity I enjoy  Stop smoking go to the [employer] (https://scott-book[employer].info/); call [employer] (1-800-QUIT-NOW); cut down the number of cigarettes smoked  Prevent falls -  Meeting treatment goals -    Home Blood Glucose Monitoring 10/17/2014  Check my blood sugar -  When to check my blood sugar before meals     Care Management & Community Referrals:  Referral 02/28/2014  Referrals made for care management support none needed  Referrals made to community resources -

## 2014-10-17 NOTE — Assessment & Plan Note (Signed)
BP Readings from Last 3 Encounters:  10/17/14 139/80  10/09/14 141/78  10/09/14 141/78   Lab Results  Component Value Date   NA 141 10/07/2014   K 4.3 10/07/2014   CREATININE 0.77 10/09/2014   Assessment: Blood pressure control: controlled Progress toward BP goal:  at goal Comments: will continue to monitor, she still has room to go back up on her cozaar to 100mg  but will leave at 90 for now  Plan: Medications:  continue current medications cozaar 50mg , norvasc 5mg  Educational resources provided: brochure Self management tools provided: home blood pressure logbook Other plans: stressed importance of risk factor modification

## 2014-10-17 NOTE — Progress Notes (Signed)
Subjective:   Patient ID: Susan Holmes female   DOB: 03/19/1956 58 y.o.   MRN: [id number]  HPI: [name] is a 58 y.o. smoker female with HTN and DM2 presenting to opc today for hospital follow up visit.  She was recently admitted to the hospital 10/12-10/15 for acute chest pain, found to have non-obstructive CAD by cath with false positive nuclear stress test  1. Widely patent left main coronary artery. 2. Mild disease in the left anterior descending artery and its branches. 3. Mild disease in the left circumflex artery and its branches. 4. Mild proximal disease in the right coronary artery. 5. Normal left ventricular systolic function. LVEDP 10 mmHg. Ejection fraction 55%.  She was recommended for risk factor modification, started on atorvastatin for cholesterol. DM appears well controlled A1C 6.5 recently but did not bring her meter in today.  She reports her cbg's at home to be <150.   Today, she continues to report intermittent chest discomfort, now stinging sensation and says the pain has moved to the left side now including whole arm. The arm feels like dull pain  HTN--improved to 139/80 today.   Refused flu vaccine  Brought in more forms from [employer] for pcp review  Past Medical History  Diagnosis Date  . Hepatitis B infection pt unsure    resolved  . Diabetes mellitus, type II   . HTN (hypertension)   . Hyperlipidemia   . Congenital blindness     L eye  . Tobacco abuse     .5 PPD smoker  . Anxiety   . Depression   . Arthritis   . Abnormal stress test    Current Outpatient Prescriptions  Medication Sig Dispense Refill  . ACCU-CHEK FASTCLIX LANCETS MISC Check blood sugar 2x a day as instructed dx cod 250.00 insulin requiring  102 each  5  . albuterol-ipratropium (COMBIVENT) 18-103 MCG/ACT inhaler Inhale 2 puffs into the lungs every 4 (four) hours as needed for wheezing or shortness of breath.      Marland Kitchen amLODipine (NORVASC) 5 MG tablet Take 1 tablet (5 mg  total) by mouth daily.  30 tablet  1  . aspirin EC 81 MG tablet Take 1 tablet (81 mg total) by mouth daily.  30 tablet  12  . atorvastatin (LIPITOR) 80 MG tablet Take 1 tablet (80 mg total) by mouth daily at 6 PM.  30 tablet  0  . Blood Glucose Monitoring Suppl (ACCU-CHEK AVIVA PLUS) W/DEVICE KIT Use to check blood sugar 3 to 4 times daily. diag code 250.62. Insulin dependent  1 kit  0  . etodolac (LODINE XL) 400 MG 24 hr tablet Take 2 tablets (800 mg total) by mouth daily.  30 tablet  3  . ferrous sulfate 325 (65 FE) MG tablet Take 325 mg by mouth daily with breakfast.      . gabapentin (NEURONTIN) 300 MG capsule Take 2 capsules (600 mg total) by mouth 3 (three) times daily.  120 capsule  2  . glucose blood (ONE TOUCH ULTRA TEST) test strip 1 each by Other route 2 (two) times daily as needed for other. Use as instructed  100 each  11  . insulin glargine (LANTUS) 100 UNIT/ML injection Inject 0.2 mLs (20 Units total) into the skin at bedtime.  10 mL  5  . LACTOBACILLUS PO Take 1 capsule by mouth daily.      Marland Kitchen losartan (COZAAR) 50 MG tablet Take 50 mg by mouth daily.      Marland Kitchen  metFORMIN (GLUCOPHAGE) 500 MG tablet Take 500 mg by mouth 2 (two) times daily with a meal.      . mirtazapine (REMERON) 15 MG tablet Take 15 mg by mouth at bedtime.       . naproxen (NAPROSYN) 250 MG tablet Take 1 tablet (250 mg total) by mouth 2 (two) times daily with a meal.  60 tablet  1  . nicotine polacrilex (NICORETTE) 4 MG gum Take 1 each (4 mg total) by mouth as needed for smoking cessation.  100 tablet  0  . pantoprazole (PROTONIX) 40 MG tablet Take 1 tablet (40 mg total) by mouth daily.  30 tablet  0  . traZODone (DESYREL) 100 MG tablet Take 250 mg by mouth at bedtime.       . diclofenac sodium (VOLTAREN) 1 % GEL Apply 2 g topically 4 (four) times daily.  100 g  0  . loperamide (IMODIUM) 2 MG capsule Take 2 mg by mouth daily as needed for diarrhea or loose stools.      . nicotine (NICODERM CQ - DOSED IN MG/24 HR) 7  mg/24hr patch Place 1 patch (7 mg total) onto the skin daily.  28 patch  0   No current facility-administered medications for this visit.   Family History  Problem Relation Age of Onset  . Heart disease Mother 38  . Hypertension Mother   . Heart disease Father 74  . Aneurysm Brother   . Aneurysm Sister   . Colon cancer Neg Hx   . Esophageal cancer Neg Hx   . Stomach cancer Neg Hx    History   Social History  . Marital Status: Divorced    Spouse Name: N/A    Number of Children: 1  . Years of Education: 12   Occupational History  .  Unemployed   Social History Main Topics  . Smoking status: Current Every Day Smoker -- 0.50 packs/day for 20 years    Types: Cigarettes  . Smokeless tobacco: Current User    Types: Chew     Comment: did not want to discuss any stopping or decreasing  . Alcohol Use: No  . Drug Use: No  . Sexual Activity: Not Currently    Birth Control/ Protection: Surgical   Other Topics Concern  . None   Social History Narrative  . None   Review of Systems:  Respiratory:  SOB at times  Cardiovascular:  Intermittent chest pain   Musculoskeletal:  L arm pain and chest wall pain   Skin:  Soft mass left thigh  Neurological:  Denies seizure   Objective:  Physical Exam: Filed Vitals:   10/17/14 0856  BP: 139/80  Pulse: 74  Temp: 98.2 F (36.8 C)  TempSrc: Oral  Weight: 178 lb 9.6 oz (81.012 kg)  SpO2: 99%   Vitals reviewed. General: sitting in chair, NAD HEENT: EOMI Cardiac: RRR Chest wall: tenderness to palpation left side of sternum Pulm: clear to auscultation bilaterally, no wheezes, rales, or rhonchi Abd: soft, BS present Ext: moving all 4 extremities, no tenderness to palpation of extremities, left thigh palpable soft tissue area, non-tender, no visible elevation, edema, or erythema. Right thigh medial surface ecchymosis Neuro: alert and oriented X3, strength 4/5 upper extremities and sensation to light touch equal in bilateral upper and  lower extremities  Assessment & Plan:  Discussed with Dr. Lynnae January Increased etodolac dose to 841m qd

## 2014-10-17 NOTE — Assessment & Plan Note (Signed)
Lab Results  Component Value Date   HGBA1C 6.5 08/15/2014   HGBA1C 9.6 02/28/2014   HGBA1C 5.6 11/02/2013    Assessment: Diabetes control: good control (HgbA1C at goal) Progress toward A1C goal:  at goal Comments: did not bring in her meter today but says cbgs well controlled <150 usually  Plan: Medications:  continue current medications lantus 20 units qhs and metformin 500mg  bid Home glucose monitoring: Frequency:   Timing: before meals Instruction/counseling given: reminded to bring blood glucose meter & log to each visit and reminded to bring medications to each visit Educational resources provided: brochure Self management tools provided: copy of home glucose meter download Other plans: hopefully she will bring her meter with her next time, RTC next month for a1c check

## 2014-10-17 NOTE — Assessment & Plan Note (Signed)
Refused flu vaccination today

## 2014-10-17 NOTE — Assessment & Plan Note (Signed)
Now on high dose lipitor

## 2014-10-17 NOTE — Telephone Encounter (Signed)
Medication denied, pt aware.Regenia Skeeter, Darlene Cassady10/22/201510:05 AM

## 2014-10-17 NOTE — Assessment & Plan Note (Addendum)
She brought in more DMV paperwork today and for ignition interlock waiver form. She has had her 2 pfts done already and reviewed by Dr. Eppie Gibson.   I will try to review the remainder of these forms with Dr. Sherrine Maples pcp to see what else needs to be completed and sent.

## 2014-10-17 NOTE — Assessment & Plan Note (Signed)
  Assessment: Progress toward smoking cessation:  smoking less (down to 5 cigarettes per day) Barriers to progress toward smoking cessation:    Comments: trying to cut back.  During our discussion she seemed to get a little frustrated so I did not push the subject further and just encouraged cessation if possible, understanding that it is very difficult and all she can do is try. She is interested in patches + gum  Plan: Instruction/counseling given:  I counseled patient on the dangers of tobacco use, advised patient to stop smoking, and reviewed strategies to maximize success. Educational resources provided:  [employer] account manager) brochure Self management tools provided:  smoking cessation plan (STAR Quit Plan) Medications to assist with smoking cessation:  Nicotine Patch and gum Patient agreed to the following self-care plans for smoking cessation: go to the [employer] (www.quitlinenc.com);call [employer] (1-800-QUIT-NOW);cut down the number of cigarettes smoked  Other plans: fill low dose nicotine patches today per her request

## 2014-10-18 ENCOUNTER — Other Ambulatory Visit: Payer: Self-pay | Admitting: Internal Medicine

## 2014-10-21 NOTE — Progress Notes (Signed)
Internal Medicine Clinic Attending  Case discussed with Dr. Qureshi soon after the resident saw the patient.  We reviewed the resident's history and exam and pertinent patient test results.  I agree with the assessment, diagnosis, and plan of care documented in the resident's note. 

## 2014-10-22 ENCOUNTER — Other Ambulatory Visit: Payer: Self-pay | Admitting: *Deleted

## 2014-10-22 DIAGNOSIS — E1165 Type 2 diabetes mellitus with hyperglycemia: Principal | ICD-10-CM

## 2014-10-22 DIAGNOSIS — IMO0002 Reserved for concepts with insufficient information to code with codable children: Secondary | ICD-10-CM

## 2014-10-22 DIAGNOSIS — E1149 Type 2 diabetes mellitus with other diabetic neurological complication: Secondary | ICD-10-CM

## 2014-10-25 MED ORDER — METFORMIN HCL 500 MG PO TABS: 500.0000 mg | ORAL_TABLET | Freq: Two times a day (BID) | ORAL | Status: DC

## 2014-11-03 ENCOUNTER — Other Ambulatory Visit: Payer: Self-pay | Admitting: Internal Medicine

## 2014-11-03 DIAGNOSIS — E1165 Type 2 diabetes mellitus with hyperglycemia: Principal | ICD-10-CM

## 2014-11-03 DIAGNOSIS — E1149 Type 2 diabetes mellitus with other diabetic neurological complication: Secondary | ICD-10-CM

## 2014-11-03 DIAGNOSIS — IMO0002 Reserved for concepts with insufficient information to code with codable children: Secondary | ICD-10-CM

## 2014-11-05 NOTE — Progress Notes (Signed)
HPI: FU CP; h/o diabetes mellitus, hypertension, hyperlipidemia, tobacco abuse. Admitted with chest pain in October 2015. Echocardiogram in October 2015 showed an ejection fraction of 45-50%, diffuse hypokinesis and trace mitral regurgitation. ABIs in October 2015 normal at rest. Nuclear study showed an ejection fraction of 38% with a fixed apical defect. Because of reduced LV function cardiac catheterization was performed and showed no obstructive coronary disease. Ejection fraction 55%. Left ventricular end-diastolic pressure 10 mmHg. Since discharge, She has mild dyspnea. She describes occasional bilateral upper extremity pain but no chest pain. No syncope.  Current Outpatient Prescriptions  Medication Sig Dispense Refill  . ACCU-CHEK FASTCLIX LANCETS MISC Check blood sugar 2x a day as instructed dx cod 250.00 insulin requiring 102 each 5  . albuterol-ipratropium (COMBIVENT) 18-103 MCG/ACT inhaler Inhale 2 puffs into the lungs every 4 (four) hours as needed for wheezing or shortness of breath.    Marland Kitchen amLODipine (NORVASC) 5 MG tablet Take 1 tablet (5 mg total) by mouth daily. 30 tablet 1  . aspirin EC 81 MG tablet Take 1 tablet (81 mg total) by mouth daily. 30 tablet 12  . atorvastatin (LIPITOR) 80 MG tablet TAKE 1 TABLET BY MOUTH DAILY AT 6 PM 30 tablet 0  . Blood Glucose Monitoring Suppl (ACCU-CHEK AVIVA PLUS) W/DEVICE KIT Use to check blood sugar 3 to 4 times daily. diag code 250.62. Insulin dependent 1 kit 0  . diclofenac sodium (VOLTAREN) 1 % GEL Apply 2 g topically 4 (four) times daily. 100 g 0  . etodolac (LODINE XL) 400 MG 24 hr tablet Take 2 tablets (800 mg total) by mouth daily. 30 tablet 3  . ferrous sulfate 325 (65 FE) MG tablet Take 325 mg by mouth daily with breakfast.    . gabapentin (NEURONTIN) 300 MG capsule Take 2 capsules (600 mg total) by mouth 3 (three) times daily. 120 capsule 2  . glucose blood (ACCU-CHEK AVIVA PLUS) test strip Use to test blood sugar 3 times daily.  diag code E11.41. Insulin dependent 100 each 11  . glucose blood (ONE TOUCH ULTRA TEST) test strip 1 each by Other route 2 (two) times daily as needed for other. Use as instructed 100 each 11  . insulin glargine (LANTUS) 100 UNIT/ML injection Inject 0.2 mLs (20 Units total) into the skin at bedtime. 10 mL 5  . LACTOBACILLUS PO Take 1 capsule by mouth daily.    Marland Kitchen loperamide (IMODIUM) 2 MG capsule Take 2 mg by mouth daily as needed for diarrhea or loose stools.    Marland Kitchen losartan (COZAAR) 50 MG tablet Take 50 mg by mouth daily.    . metFORMIN (GLUCOPHAGE) 500 MG tablet Take 1 tablet (500 mg total) by mouth 2 (two) times daily with a meal. 180 tablet 1  . mirtazapine (REMERON) 15 MG tablet Take 15 mg by mouth at bedtime.     . naproxen (NAPROSYN) 250 MG tablet Take 1 tablet (250 mg total) by mouth 2 (two) times daily with a meal. 60 tablet 1  . nicotine (NICODERM CQ - DOSED IN MG/24 HR) 7 mg/24hr patch Place 1 patch (7 mg total) onto the skin daily. 28 patch 0  . nicotine polacrilex (NICORETTE) 4 MG gum Take 1 each (4 mg total) by mouth as needed for smoking cessation. 100 tablet 0  . pantoprazole (PROTONIX) 40 MG tablet Take 1 tablet (40 mg total) by mouth daily. 30 tablet 0  . traZODone (DESYREL) 100 MG tablet Take 250 mg by mouth  at bedtime.      No current facility-administered medications for this visit.     Past Medical History  Diagnosis Date  . Hepatitis B infection pt unsure    resolved  . Diabetes mellitus, type II   . HTN (hypertension)   . Hyperlipidemia   . Congenital blindness     L eye  . Tobacco abuse     .5 PPD smoker  . Anxiety   . Depression   . Arthritis   . Abnormal stress test     Past Surgical History  Procedure Laterality Date  . Abdominal hysterectomy      History   Social History  . Marital Status: Divorced    Spouse Name: N/A    Number of Children: 1  . Years of Education: 12   Occupational History  .  Unemployed   Social History Main Topics  .  Smoking status: Current Every Day Smoker -- 0.50 packs/day for 20 years    Types: Cigarettes  . Smokeless tobacco: Current User    Types: Chew     Comment: did not want to discuss any stopping or decreasing  . Alcohol Use: No  . Drug Use: No  . Sexual Activity: Not Currently    Birth Control/ Protection: Surgical   Other Topics Concern  . Not on file   Social History Narrative    ROS: no fevers or chills, productive cough, hemoptysis, dysphasia, odynophagia, melena, hematochezia, dysuria, hematuria, rash, seizure activity, orthopnea, PND, pedal edema, claudication. Remaining systems are negative.  Physical Exam: Well-developed well-nourished in no acute distress.  Skin is warm and dry.  HEENT is normal.  Neck is supple.  Chest is clear to auscultation with normal expansion.  Cardiovascular exam is regular rate and rhythm.  Abdominal exam nontender or distended. No masses palpated. Extremities show no edema. neuro grossly intact  ECG Sinus rhythm at a rate of 81. Nonspecific ST changes.

## 2014-11-06 ENCOUNTER — Other Ambulatory Visit: Payer: Self-pay | Admitting: Internal Medicine

## 2014-11-07 NOTE — Telephone Encounter (Signed)
twice

## 2014-11-08 ENCOUNTER — Ambulatory Visit (INDEPENDENT_AMBULATORY_CARE_PROVIDER_SITE_OTHER): Payer: PRIVATE HEALTH INSURANCE | Admitting: *Deleted

## 2014-11-08 ENCOUNTER — Ambulatory Visit (INDEPENDENT_AMBULATORY_CARE_PROVIDER_SITE_OTHER): Payer: PRIVATE HEALTH INSURANCE | Admitting: Cardiology

## 2014-11-08 ENCOUNTER — Encounter: Payer: Self-pay | Admitting: Cardiology

## 2014-11-08 VITALS — BP 140/90 | HR 81 | Ht 67.0 in | Wt 182.0 lb

## 2014-11-08 DIAGNOSIS — Z720 Tobacco use: Secondary | ICD-10-CM

## 2014-11-08 DIAGNOSIS — F17200 Nicotine dependence, unspecified, uncomplicated: Secondary | ICD-10-CM

## 2014-11-08 DIAGNOSIS — I10 Essential (primary) hypertension: Secondary | ICD-10-CM

## 2014-11-08 DIAGNOSIS — Z23 Encounter for immunization: Secondary | ICD-10-CM

## 2014-11-08 DIAGNOSIS — R072 Precordial pain: Secondary | ICD-10-CM

## 2014-11-08 NOTE — Assessment & Plan Note (Signed)
Continue present blood pressure medications. 

## 2014-11-08 NOTE — Assessment & Plan Note (Signed)
Patient counseled on discontinuing. 

## 2014-11-08 NOTE — Patient Instructions (Signed)
Your physician recommends that you schedule a follow-up appointment in: as needed  

## 2014-11-08 NOTE — Assessment & Plan Note (Signed)
Continue statin. 

## 2014-11-08 NOTE — Assessment & Plan Note (Signed)
Symptoms atypical.Recent catheterization showed no obstructive coronary disease. No further evaluation at this time.

## 2014-11-12 ENCOUNTER — Other Ambulatory Visit: Payer: Self-pay | Admitting: *Deleted

## 2014-11-12 ENCOUNTER — Telehealth: Payer: Self-pay | Admitting: Hematology

## 2014-11-12 DIAGNOSIS — D509 Iron deficiency anemia, unspecified: Secondary | ICD-10-CM

## 2014-11-12 NOTE — Telephone Encounter (Signed)
Confirm appt d/t for 11/13/14

## 2014-11-13 ENCOUNTER — Ambulatory Visit (HOSPITAL_BASED_OUTPATIENT_CLINIC_OR_DEPARTMENT_OTHER): Payer: PRIVATE HEALTH INSURANCE | Admitting: Hematology

## 2014-11-13 ENCOUNTER — Encounter: Payer: Self-pay | Admitting: Hematology

## 2014-11-13 ENCOUNTER — Other Ambulatory Visit: Payer: PRIVATE HEALTH INSURANCE

## 2014-11-13 VITALS — BP 139/83 | HR 74 | Temp 97.9°F | Resp 18 | Ht 67.0 in | Wt 178.0 lb

## 2014-11-13 DIAGNOSIS — E119 Type 2 diabetes mellitus without complications: Secondary | ICD-10-CM

## 2014-11-13 DIAGNOSIS — F329 Major depressive disorder, single episode, unspecified: Secondary | ICD-10-CM

## 2014-11-13 DIAGNOSIS — D509 Iron deficiency anemia, unspecified: Secondary | ICD-10-CM

## 2014-11-13 NOTE — Progress Notes (Addendum)
[place] OFFICE PROGRESS NOTE  Drucilla Schmidt, MD [address] [place] [address]  DIAGNOSIS:  Anemia, iron deficient anemia   CURRENT TREATMENT:   Oral FERROUS SULFATE 325MG  DAILY   INTERVAL HISTORY: Susan Holmes 58 y.o. female with a history of anemia here for follo wup. Shs started oral iron since her last visit with Dr. Juliann Mule in May 2015. She is on 1 tab daily. She is tolerating it well, mild constipation. She still feels very fatigued, she is able to do all daily living activities, but with a slow space. She is on disability, has limited income, and has limited access for food. She feels depressed, but not suicidal. She complains about her blurry vision, and was evaluated by our ophthalmologist, and was told she has cataracts with surgery was not recommended at this point. She otherwise denies chest pain, nausea, or any other new symptoms.   MEDICAL HISTORY: Past Medical History  Diagnosis Date  . Hepatitis B infection pt unsure    resolved  . Diabetes mellitus, type II   . HTN (hypertension)   . Hyperlipidemia   . Congenital blindness     L eye  . Tobacco abuse     .5 PPD smoker  . Anxiety   . Depression   . Arthritis   . Abnormal stress test     ALLERGIES:  is allergic to ace inhibitors.  MEDICATIONS: has a current medication list which includes the following prescription(s): accu-chek fastclix lancets, albuterol-ipratropium, amlodipine, aspirin ec, atorvastatin, accu-chek aviva plus, diclofenac sodium, etodolac, ferrous sulfate, gabapentin, glucose blood, glucose blood, insulin glargine, lactobacillus, loperamide, losartan, metformin, mirtazapine, naproxen, nicotine, nicotine polacrilex, pantoprazole, and trazodone.  SURGICAL HISTORY:  Past Surgical History  Procedure Laterality Date  . Abdominal hysterectomy      REVIEW OF SYSTEMS:   Constitutional: Denies fevers, chills or abnormal weight loss Eyes: Denies blurriness of vision Ears,  nose, mouth, throat, and face: Denies mucositis or sore throat now Ipast and is Respiratory: Denies cough, dyspnea or wheezes Cardiovascular: Denies palpitation, chest discomfort or lower extremity swelling Gastrointestinal:  Denies nausea, heartburn or change in bowel habits Skin: Denies abnormal skin rashes Lymphatics: Denies new lymphadenopathy or easy bruising Neurological:Denies numbness, tingling or new weaknesses Behavioral/Psych: Mood is stable, no new changes  All other systems were reviewed with the patient and are negative.  PHYSICAL EXAMINATION: Blood pressure 139/83, pulse 74, temperature 97.9 F (36.6 C), temperature source Oral, resp. rate 18, height 5\' 7"  (1.702 m), weight 178 lb (80.74 kg).  GENERAL:alert, no distress and comfortable; well developed and well nourished.  SKIN: skin color, texture, turgor are normal, no rashes or significant lesions EYES: normal, Conjunctiva are pink and non-injected, sclera clear OROPHARYNX:no exudate, no erythema and lips, buccal mucosa, and tongue normal; Poor dentition.  NECK: supple, thyroid normal size, non-tender, without nodularity LYMPH:  no palpable lymphadenopathy in the cervical, axillary or supraclavicular LUNGS: clear to auscultation with normal breathing effort, no wheezes or rhonchi HEART: regular rate & rhythm and no murmurs and no lower extremity edema ABDOMEN:abdomen soft, non-tender and normal bowel sounds, slightly obese Musculoskeletal:no cyanosis of digits and no clubbing  NEURO: alert & oriented x 3 with fluent speech, no focal motor/sensory deficits  Labs:  Lab Results  Component Value Date   WBC 5.9 10/09/2014   HGB 12.4 10/09/2014   HCT 40.0 10/09/2014   MCV 69.2* 10/09/2014   PLT 216 10/09/2014   NEUTROABS 2.1 10/07/2014  Chemistry      Component Value Date/Time   NA 141 10/07/2014 1400   NA 143 05/14/2014 0858   K 4.3 10/07/2014 1400   K 4.1 05/14/2014 0858   CL 104 10/07/2014 1400   CO2 23  10/07/2014 1400   CO2 20* 05/14/2014 0858   BUN 10 10/07/2014 1400   BUN 18.1 05/14/2014 0858   CREATININE 0.77 10/09/2014 1600   CREATININE 0.82 09/19/2014 1130   CREATININE 0.9 05/14/2014 0858      Component Value Date/Time   CALCIUM 10.1 10/07/2014 1400   CALCIUM 10.0 05/14/2014 0858   CALCIUM 11.5* 03/06/2013 1653   ALKPHOS 68 10/07/2014 1400   ALKPHOS 71 05/14/2014 0858   AST 17 10/07/2014 1400   AST 26 05/14/2014 0858   ALT 14 10/07/2014 1400   ALT 22 05/14/2014 0858   BILITOT 0.3 10/07/2014 1400   BILITOT 0.32 05/14/2014 0858      Results for Silas, Cylah L (MRN [id number]) as of 02/18/2014 09:02  Ref. Range 02/05/2014 11:36  Hgb A Latest Range: 96.8-97.8 % 97.8  Hgb A2 Quant Latest Range: 2.2-3.2 % 2.2  Hgb F Quant Latest Range: 0.0-2.0 % 0.0  Hgb S Quant Latest Range: 0.0 % 0.0  Hemoglobin Other Latest Range: 0.0 % 0.0   RADIOGRAPHIC STUDIES: 05/14/2014  MR Abdomen with and without contrast  TECHNIQUE: Multiplanar multisequence MR imaging of the abdomen was performed  both before and after the administration of intravenous contrast.  CONTRAST: 4mL MULTIHANCE GADOBENATE DIMEGLUMINE 529 MG/ML IV SOLN  COMPARISON: MRI of 11/11/2013. CT of 10/31/2013. FINDINGS:  Primarily the post-contrast portion of the exam is moderately motion  degraded. Mild cardiomegaly, without pericardial or pleural effusion. Small hiatal hernia. 8 mm posterior segment right liver lobe lesion is unchanged in size  since the prior exam. Example image 16/ series 3. Demonstrates marked T2 hyperintensity. Not well evaluated after contrast.  Probable peripheral enhancement, including on image 48/series 14. No new or enlarging liver lesions identified. Normal spleen, distal stomach, pancreas, adrenal glands, kidneys, biliary tract. Lower pole left renal cyst measures 7 mm. The right kidney is mildly scarred. Mild caliectasis in the upper pole collecting system stones. Example image 28/series 3. This appearance  is similar to on the prior exam.  No abdominal adenopathy or ascites. IMPRESSION: 1. The post-contrast portion of the exam is again motion degraded. 2. Similar size of a right liver lobe lesion, which given T2 hyperintensity and suggestion of peripheral enhancement is most consistent with a hemangioma. 3. Scarred right kidney with upper pole stones and caliectasis, chronic.    ASSESSMENT: BELLAMIE TURNEY 58 y.o. female with a history of anemia, and responded to oral iron therapy well.   1. Microcytic Anemia, likely iron deficiency --she had normal iron study but the ferritin was on low side (40). Colonoscopy was negative except polyps. Hemoglobin electrophoresis and SPEP was negative. She responded to oral iron supplement well, and her hemoglobin recently has been close to normal. This is likely iron deficient anemia. -I will check iron study including ferritin again today and decided how long she needs oral iron supplements. I'll call her with the results.  She will continue follow-up with her primary care physician for diabetes, depression and other issues. Since her anemia is nearly resolved, . No further workup is necessary at this point, I'll defer her anemia follow-up to her primary care physician, I recommend checking CBC every 4-6 months. I'll see her on an as-needed basis in the future.  My contact information was given to her and she knows to call if needed.  I spent 15 minutes counseling the patient face to face. The total time spent in the appointment was 25 minutes.    Truitt Merle, MD 11/13/2014 9:49 AM

## 2014-11-15 ENCOUNTER — Other Ambulatory Visit: Payer: Self-pay | Admitting: Internal Medicine

## 2014-11-17 ENCOUNTER — Other Ambulatory Visit: Payer: Self-pay | Admitting: Internal Medicine

## 2014-11-18 ENCOUNTER — Telehealth: Payer: Self-pay | Admitting: Internal Medicine

## 2014-11-18 ENCOUNTER — Encounter: Payer: Self-pay | Admitting: Internal Medicine

## 2014-11-18 ENCOUNTER — Ambulatory Visit (INDEPENDENT_AMBULATORY_CARE_PROVIDER_SITE_OTHER): Payer: PRIVATE HEALTH INSURANCE | Admitting: Internal Medicine

## 2014-11-18 VITALS — BP 146/88 | HR 90 | Temp 98.1°F | Resp 20 | Ht 67.5 in | Wt 177.9 lb

## 2014-11-18 DIAGNOSIS — M79601 Pain in right arm: Secondary | ICD-10-CM

## 2014-11-18 DIAGNOSIS — Z794 Long term (current) use of insulin: Secondary | ICD-10-CM

## 2014-11-18 DIAGNOSIS — M79602 Pain in left arm: Secondary | ICD-10-CM

## 2014-11-18 DIAGNOSIS — E1165 Type 2 diabetes mellitus with hyperglycemia: Secondary | ICD-10-CM

## 2014-11-18 DIAGNOSIS — I10 Essential (primary) hypertension: Secondary | ICD-10-CM

## 2014-11-18 DIAGNOSIS — H538 Other visual disturbances: Secondary | ICD-10-CM

## 2014-11-18 DIAGNOSIS — E1149 Type 2 diabetes mellitus with other diabetic neurological complication: Secondary | ICD-10-CM

## 2014-11-18 LAB — COMPLETE METABOLIC PANEL WITH GFR
ALT: 23 U/L (ref 0–35)
AST: 20 U/L (ref 0–37)
Albumin: 4.2 g/dL (ref 3.5–5.2)
Alkaline Phosphatase: 103 U/L (ref 39–117)
BUN: 10 mg/dL (ref 6–23)
CO2: 27 mEq/L (ref 19–32)
Calcium: 11.1 mg/dL — ABNORMAL HIGH (ref 8.4–10.5)
Chloride: 97 mEq/L (ref 96–112)
Creat: 0.79 mg/dL (ref 0.50–1.10)
GFR, Est African American: >89 mL/min
GFR, Est Non African American: 82 mL/min
Glucose, Bld: 449 mg/dL — ABNORMAL HIGH (ref 70–99)
Potassium: 4.9 mEq/L (ref 3.5–5.3)
Sodium: 137 mEq/L (ref 135–145)
Total Bilirubin: 0.3 mg/dL (ref 0.3–1.2)
Total Protein: 7.4 g/dL (ref 6.0–8.3)

## 2014-11-18 LAB — CK: Total CK: 65 U/L (ref 7–177)

## 2014-11-18 LAB — SEDIMENTATION RATE: Sed Rate: 5 mm/hr (ref 0–22)

## 2014-11-18 LAB — CBC
HCT: 42.6 % (ref 36.0–46.0)
Hemoglobin: 13.5 g/dL (ref 12.0–15.0)
MCH: 22.2 pg — ABNORMAL LOW (ref 26.0–34.0)
MCHC: 31.7 g/dL (ref 30.0–36.0)
MCV: 70.1 fL — ABNORMAL LOW (ref 78.0–100.0)
Platelets: 209 10*3/uL (ref 150–400)
RBC: 6.08 MIL/uL — ABNORMAL HIGH (ref 3.87–5.11)
RDW: 14.8 % (ref 11.5–15.5)
WBC: 7.5 10*3/uL (ref 4.0–10.5)

## 2014-11-18 LAB — GLUCOSE, CAPILLARY
Glucose-Capillary: 583 mg/dL (ref 70–99)
Glucose-Capillary: 462 mg/dL — ABNORMAL HIGH (ref 70–99)

## 2014-11-18 LAB — POCT GLYCOSYLATED HEMOGLOBIN (HGB A1C): Hemoglobin A1C: 8.5

## 2014-11-18 LAB — C-REACTIVE PROTEIN: CRP: <0.5 mg/dL (ref <0.60)

## 2014-11-18 MED ORDER — INSULIN ASPART 100 UNIT/ML ~~LOC~~ SOLN
10.0000 [IU] | Freq: Once | SUBCUTANEOUS | Status: AC
  Administered 2014-11-18: 10 [IU] via SUBCUTANEOUS

## 2014-11-18 NOTE — Assessment & Plan Note (Signed)
BP Readings from Last 3 Encounters:  11/18/14 146/88  11/13/14 139/83  11/08/14 140/90    Lab Results  Component Value Date   NA 137 11/18/2014   K 4.9 11/18/2014   CREATININE 0.79 11/18/2014    Assessment: Blood pressure control:  slightly elevated Progress toward BP goal:   near goal Comments: She reports compliance with medications.  Arm pain may be contributing.  Plan: Medications:  continue current medications:  Losartan 50mg  daily, amlodipine 5mg  daily Other plans: May need to increase amlodipine dose to 10mg  if BP elevated at next visit.

## 2014-11-18 NOTE — Progress Notes (Signed)
   Subjective:    Patient ID: TUYEN UNCAPHER, female    DOB: February 09, 1956, 58 y.o.   MRN: [id number]  HPI Comments: Ms. Cimino is a 58 year old woman with HTN and DM here for follow-up of DM and arm pain.  Please see problem based charting for an update of her chronic conditions.     Review of Systems  Constitutional: Positive for chills. Negative for fever and appetite change.  HENT: Negative for congestion and sore throat.   Eyes: Positive for pain, itching and visual disturbance. Negative for photophobia and discharge.  Respiratory: Negative for shortness of breath.   Cardiovascular: Negative for palpitations.  Gastrointestinal: Negative for nausea, vomiting, diarrhea, constipation and blood in stool.  Genitourinary: Negative for dysuria.  Neurological: Negative for light-headedness.       Objective:   Physical Exam  Constitutional: She is oriented to person, place, and time. She appears well-developed. No distress.  HENT:  Head: Normocephalic and atraumatic.  Right Ear: External ear normal.  Left Ear: External ear normal.  Mouth/Throat: Oropharynx is clear and moist. No oropharyngeal exudate.  Sinuses non-tender; TMs normal, temples are non-tender  Eyes: Conjunctivae and EOM are normal. Pupils are equal, round, and reactive to light. Right eye exhibits no discharge. Left eye exhibits no discharge. No scleral icterus.  Cardiovascular: Normal rate and regular rhythm.  Exam reveals no gallop and no friction rub.   No murmur heard. Pulmonary/Chest: Effort normal and breath sounds normal. No respiratory distress. She has no wheezes. She has no rales.  Abdominal: Soft. Bowel sounds are normal. She exhibits no distension. There is no tenderness. There is no rebound.  Musculoskeletal: Normal range of motion. She exhibits tenderness. She exhibits no edema.  TTP anterior and lateral right shoulder.  No atrophy.  5/5 MS upper and lower extremities.  Right arm ROM normal - negative painful  arc, negative empty can, negative drop arm.   Internal/external rotation of right intact.    Lymphadenopathy:    She has no cervical adenopathy.  Neurological: She is alert and oriented to person, place, and time. No cranial nerve deficit. She exhibits normal muscle tone.  Skin: Skin is warm. She is not diaphoretic.  Psychiatric: She has a normal mood and affect. Her behavior is normal.  Vitals reviewed.         Assessment & Plan:  Please see problem based assessment and plan.

## 2014-11-18 NOTE — Patient Instructions (Signed)
1. We will call you with your eye appointment date and time.     2. Please take all medications as prescribed.    3. If you have worsening of your symptoms or new symptoms arise, please call the clinic ([phone]), or go to the ER immediately if symptoms are severe.   Please check your blood sugar at home and call clinic if they continue to run high > 250.   Please return to clinic in two days to check your blood sugar.  If you have worsening symptoms, N/V, abdominal pain, problems urinating, feel like you are going to pass out or chest pain please return to clinic.

## 2014-11-18 NOTE — Progress Notes (Signed)
Case discussed with Dr. Wilson soon after the resident saw the patient. We reviewed the resident's history and exam and pertinent patient test results. I agree with the assessment, diagnosis, and plan of care documented in the resident's note. 

## 2014-11-18 NOTE — Assessment & Plan Note (Addendum)
Lab Results  Component Value Date   HGBA1C 8.5 11/18/2014   HGBA1C 6.5 08/15/2014   HGBA1C 9.6 02/28/2014     Assessment: Diabetes control:  uncontrolled Progress toward A1C goal:   deteriorated Comments: Reports compliance with Lantus 20 units qHS and metformin 500mg  BID.  She forgot her meter.  Reports elevated CBGs in the 500s in the past week.  Before last week she reports CBGS around 130, nothing above 160 and nothing below 130.  She denies hypoglycemic symptoms.  Denies dietary indiscretions.  Drinks diet drinks and water.  No new meds.  CBG is 583 in clinic.  She was given 10 units of Novolog.  She could not wait in clinic for repeat CBG in the early afternoon.  She agreed to have lunch when she left.  I will call to check on her this evening.  Plan: Medications:   Home glucose monitoring:  INCREASE Lantus from 20 units qHS to 24 units qHS and INCREASE metformin from 500mg  BID to 1000mg  in the AM and 500mg  in the PM. Frequency:  2-3x per day Timing:   Instruction/counseling given: reminded to get eye exam Other plans: CMP, CBC (r/o infection) today.  She says she was told by optometrist she may have a cataract in the right eye.  She has noticed increased blurry vision on the right. Will refer to ophtho for further evaluation.     ADDENDUM:  11/23 labs reviewed:  Nothing to suggest DKA/HHS or infection.  She is scheduled to return to clinic on 11/25 for follow-up.

## 2014-11-18 NOTE — Telephone Encounter (Signed)
I called to see how Susan Holmes was doing because her blood sugar was elevated at 583 in clinic.  She says she still feels the same and CBG is 444.  She is not having abdominal pain or N/V.  Her main complaint is arm pain x 2 months.  Today's labs reviewed and there is no increased AG to suggest DKA.  WBC is normal.  She has not yet taken her nightly Lantus or metformin.  I advised her to take these medications now.  I increased her metformin and Lantus dose at today's visit (Hgb A1c 8.5).  Because of her arm pain complaints I have advised her to stop taking her statin.  I have asked her to come in to clinic tomorrow or the day after (clinic will be closed at the end of the week) and to bring her meter.  I have advised her to go to the ED if she develops new or worsening symptoms, including abdominal pain or N/V.  She agrees to do so.  Duwaine Maxin, DO

## 2014-11-19 ENCOUNTER — Telehealth: Payer: Self-pay | Admitting: *Deleted

## 2014-11-19 DIAGNOSIS — H538 Other visual disturbances: Secondary | ICD-10-CM | POA: Insufficient documentation

## 2014-11-19 DIAGNOSIS — M79601 Pain in right arm: Secondary | ICD-10-CM | POA: Insufficient documentation

## 2014-11-19 DIAGNOSIS — M79602 Pain in left arm: Secondary | ICD-10-CM

## 2014-11-19 NOTE — Assessment & Plan Note (Signed)
She reports blurry vision in the right eye x 3 months.  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  She was evaluated by an optometrist who told her she has a cataract.  In the past week she has noticed more frequent frontal headache (but has no headache today), 3/10 eye pain and eye itching.  She denies complete vision loss, excruciating pain, foreign body sensation, floaters, halos or discharge.  She is unable to see the letters on vision testing in office (unable to grade).  Visual fields are full on exam, pupils equal/round/reactive, there is no redness/discharge or sign of infection.  No floaters to suggest PVD or retinal detachment.  She is able to see the computer screen and tv screen but reading print has become more difficult in the past 3 months.   This problem came on gradually.  Differential includes cataract (she has already been evaluated and told she has cataract on the right, I cannot see one on gross exam but slit lamp would offer better look), elevated blood glucose (A1c has increased from 6.5 to 8.5 along with symptoms of polydipsia and polyuria), worsening myopia/hyperopia, presbyopia.  More acute problems that I feel are less likely but still need further investigation would be acute closed angle glaucoma (less likely since her symptoms have been gradual, her pain is 3/10, she is in no distress and her main c/o is chronic arm pain, pupils are equal and reactive, she sees no halos and has no N/V), GCA (she has noticed increased frequency of frontal headaches this week, though she is currently pain free, along with c/o of B/L arm pain/weakness raising concern for PMR which often occurs with GCA; reassuringly, she does not have temporal tenderness). - urgent referral to ophthalmology  - ESR, CRP to evaluate for GCA - normal - I have increased her insulin and metformin for better blood sugar control. - She will follow-up in clinic on 11/25

## 2014-11-19 NOTE — Assessment & Plan Note (Addendum)
She reports two months of B/L arm pain.  This is her primary complaint today.  CK was elevated to 579 (Sept 2015).  She continues to take Pravastatin (though our record indicates she was switched to Lipitor after recent hospitalization.  She also notes some tiredness in her arms with overhead activity.  She denies hip pain.  Differential includes exacerbation of know right rotator cuff tendinopathy (though pain is B/L), statin myopathy (she reports compliance with Pravastatin which would be less likely but still could cause myopathy) or PMR (she has pain, subjective arm weakness, recent blurry vision).  I favor statin myopathy given B/L symptoms with previously elevated CK (though CK is not always elevated despite myopathy symptoms). - d/c statin - check CK, CMP, ESR, CRP - NSAID or Tylenol prn for pain - follow-up in 1 month 

## 2014-11-19 NOTE — Telephone Encounter (Signed)
Pt called in to report this morning CBG of 457 She checked CBG before eating and she has taken all her meds.  She has a scheduled appointment in clinic tomorrow, she was not aware of this but will come in and bring her meter. No other c/o

## 2014-11-20 ENCOUNTER — Ambulatory Visit (INDEPENDENT_AMBULATORY_CARE_PROVIDER_SITE_OTHER): Payer: PRIVATE HEALTH INSURANCE | Admitting: Internal Medicine

## 2014-11-20 ENCOUNTER — Encounter: Payer: Self-pay | Admitting: Internal Medicine

## 2014-11-20 ENCOUNTER — Telehealth: Payer: Self-pay | Admitting: Internal Medicine

## 2014-11-20 VITALS — BP 137/87 | HR 98 | Temp 97.6°F | Ht 67.0 in | Wt 179.4 lb

## 2014-11-20 DIAGNOSIS — E1165 Type 2 diabetes mellitus with hyperglycemia: Secondary | ICD-10-CM

## 2014-11-20 DIAGNOSIS — IMO0002 Reserved for concepts with insufficient information to code with codable children: Secondary | ICD-10-CM

## 2014-11-20 DIAGNOSIS — R739 Hyperglycemia, unspecified: Secondary | ICD-10-CM

## 2014-11-20 DIAGNOSIS — E1149 Type 2 diabetes mellitus with other diabetic neurological complication: Secondary | ICD-10-CM

## 2014-11-20 LAB — BASIC METABOLIC PANEL WITH GFR
BUN: 12 mg/dL (ref 6–23)
CO2: 24 mEq/L (ref 19–32)
Calcium: 10.5 mg/dL (ref 8.4–10.5)
Chloride: 94 mEq/L — ABNORMAL LOW (ref 96–112)
Creat: 0.85 mg/dL (ref 0.50–1.10)
GFR, Est African American: 87 mL/min
GFR, Est Non African American: 75 mL/min
Glucose, Bld: 585 mg/dL (ref 70–99)
Potassium: 4.3 mEq/L (ref 3.5–5.3)
Sodium: 133 mEq/L — ABNORMAL LOW (ref 135–145)

## 2014-11-20 LAB — CBC
HCT: 39.1 % (ref 36.0–46.0)
Hemoglobin: 12.6 g/dL (ref 12.0–15.0)
MCH: 22 pg — ABNORMAL LOW (ref 26.0–34.0)
MCHC: 32.2 g/dL (ref 30.0–36.0)
MCV: 68.2 fL — ABNORMAL LOW (ref 78.0–100.0)
Platelets: 178 10*3/uL (ref 150–400)
RBC: 5.73 MIL/uL — ABNORMAL HIGH (ref 3.87–5.11)
RDW: 14.6 % (ref 11.5–15.5)
WBC: 6.9 10*3/uL (ref 4.0–10.5)

## 2014-11-20 LAB — GLUCOSE, CAPILLARY: Glucose-Capillary: 562 mg/dL (ref 70–99)

## 2014-11-20 MED ORDER — INSULIN PEN NEEDLE 31G X 8 MM MISC: 1.0000 | Freq: Every day | Status: DC

## 2014-11-20 MED ORDER — INSULIN GLARGINE 100 UNIT/ML SOLOSTAR PEN: 24.0000 [IU] | PEN_INJECTOR | Freq: Every day | SUBCUTANEOUS | Status: DC

## 2014-11-20 MED ORDER — INSULIN ASPART 100 UNIT/ML ~~LOC~~ SOLN
10.0000 [IU] | Freq: Once | SUBCUTANEOUS | Status: AC
  Administered 2014-11-20: 10 [IU] via SUBCUTANEOUS

## 2014-11-20 MED ORDER — METFORMIN HCL 500 MG PO TABS: ORAL_TABLET | ORAL | Status: DC

## 2014-11-20 NOTE — Telephone Encounter (Signed)
  Reason for call:   I placed an outgoing call to [name] at 6 PM regarding Lab result- blood sugars on BMET- 585. Pt was seen in clinic today. Was given 10 units of Novolog. Pt left before repeat blood sugars could be checked. Called patient twice no response.   Assessment/ Plan:   Pt to follow up in clinic in 2 weeks.    Susan Roys, MD   11/20/2014, 5:59 PM

## 2014-11-20 NOTE — Assessment & Plan Note (Addendum)
Her CBG was 585 in clinic today.  She was given 10 units of Novolog.  She denies dietary indiscretion, has no evidence of obvious infection and reports compliance with diabetes medications.  I increased her Lantus to 24 units and metformin to 1000mg /500mg  daily when I saw her two days ago.  She says she took 24 units of Lantus last night and 500mg  metformin this morning (she says she knew she was supposed to take 1000 but only took 500mg , not sure why, she denies N/V/diarrhea ADRs).  It is unclear what has caused this abrupt hyperglycemia.  I asked her if there were any dietary changes since we are in the holiday season but she denies.   - CBC, BMP today - normal WBC, AG only slightly elevated at 15 w/ a normal bicarb so doubt DKA/HHS - UA pending  - she was advised to continue Lantus 24 units qHS and metformin 1000mg  in the AM/500mg  in the PM - she will follow-up in 2 weeks  - she will seek emergency help if she has severe symptoms - The patient said she had to leave so I was unable to get a repeat CBG.  I spoke with her on the phone and went over the plan and she is agreeable.  She will return in 2 weeks.  She will seek emergency care for worsening or severe symptoms (severe N/V or cannot stay hydrated).

## 2014-11-20 NOTE — Progress Notes (Signed)
   Subjective:    Patient ID: Susan Holmes, female    DOB: August 06, 1956, 58 y.o.   MRN: [id number]  HPI Comments: [name] is a 58 year old woman with HTN and DM here for follow-up of DM. Please see problem based charting for an update of her DM.      Review of Systems  Constitutional: Positive for chills and appetite change. Negative for fever.  HENT: Positive for rhinorrhea. Negative for congestion, ear discharge, ear pain, sinus pressure, sneezing and sore throat.   Eyes: Positive for visual disturbance.  Respiratory: Negative for shortness of breath and wheezing.   Cardiovascular: Positive for chest pain. Negative for palpitations and leg swelling.       Still with occasional CP on-and-off all day lasting 2-3 minutes.  Feels like a "tremor."  Gastrointestinal: Negative for nausea, vomiting, abdominal pain, diarrhea, constipation and blood in stool.  Endocrine: Positive for polydipsia and polyuria.  Genitourinary: Negative for dysuria and hematuria.  Neurological: Negative for syncope, weakness, light-headedness and headaches.       Objective:   Physical Exam  Constitutional: She is oriented to person, place, and time. She appears well-developed. No distress.  HENT:  Head: Normocephalic and atraumatic.  Mouth/Throat: Oropharynx is clear and moist. No oropharyngeal exudate.  Sinuses nont-tender.  TMs normal.  No mastoid or ear tenderness.  Eyes: EOM are normal. Pupils are equal, round, and reactive to light.  Neck: Normal range of motion. Neck supple.  Cardiovascular: Normal rate, regular rhythm and normal heart sounds.  Exam reveals no gallop and no friction rub.   No murmur heard. Pulmonary/Chest: Effort normal and breath sounds normal. No respiratory distress. She has no wheezes. She has no rales.  Abdominal: Soft. Bowel sounds are normal. She exhibits no distension. There is no tenderness. There is no rebound.  Musculoskeletal: Normal range of motion. She exhibits no  edema or tenderness.  Lymphadenopathy:    She has no cervical adenopathy.  Neurological: She is alert and oriented to person, place, and time. No cranial nerve deficit.  Skin: Skin is warm. She is not diaphoretic.  Psychiatric: She has a normal mood and affect. Her behavior is normal.  Vitals reviewed.         Assessment & Plan:  Please see problem based assessment and plan.

## 2014-11-21 LAB — URINALYSIS, ROUTINE W REFLEX MICROSCOPIC
Bilirubin Urine: NEGATIVE
Glucose, UA: >1000 mg/dL — AB
Hgb urine dipstick: NEGATIVE
Ketones, ur: NEGATIVE mg/dL
Nitrite: NEGATIVE
Protein, ur: NEGATIVE mg/dL
Specific Gravity, Urine: 1.029 (ref 1.005–1.030)
Urobilinogen, UA: 0.2 mg/dL (ref 0.0–1.0)
pH: 6.5 (ref 5.0–8.0)

## 2014-11-21 LAB — URINALYSIS, MICROSCOPIC ONLY
Bacteria, UA: NONE SEEN
Casts: NONE SEEN
Crystals: NONE SEEN
Squamous Epithelial / LPF: NONE SEEN

## 2014-11-22 NOTE — Progress Notes (Signed)
Internal Medicine Clinic Attending  Case discussed with Dr. Wilson soon after the resident saw the patient.  We reviewed the resident's history and exam and pertinent patient test results.  I agree with the assessment, diagnosis, and plan of care documented in the resident's note.  

## 2014-11-28 LAB — HM DIABETES EYE EXAM

## 2014-12-04 ENCOUNTER — Ambulatory Visit (INDEPENDENT_AMBULATORY_CARE_PROVIDER_SITE_OTHER): Payer: PRIVATE HEALTH INSURANCE | Admitting: Internal Medicine

## 2014-12-04 ENCOUNTER — Encounter: Payer: Self-pay | Admitting: Internal Medicine

## 2014-12-04 VITALS — BP 119/94 | HR 89 | Temp 97.9°F | Ht 67.0 in | Wt 175.6 lb

## 2014-12-04 DIAGNOSIS — B9689 Other specified bacterial agents as the cause of diseases classified elsewhere: Secondary | ICD-10-CM

## 2014-12-04 DIAGNOSIS — Z794 Long term (current) use of insulin: Secondary | ICD-10-CM

## 2014-12-04 DIAGNOSIS — F17210 Nicotine dependence, cigarettes, uncomplicated: Secondary | ICD-10-CM

## 2014-12-04 DIAGNOSIS — Z0000 Encounter for general adult medical examination without abnormal findings: Secondary | ICD-10-CM

## 2014-12-04 DIAGNOSIS — IMO0002 Reserved for concepts with insufficient information to code with codable children: Secondary | ICD-10-CM

## 2014-12-04 DIAGNOSIS — L089 Local infection of the skin and subcutaneous tissue, unspecified: Secondary | ICD-10-CM | POA: Insufficient documentation

## 2014-12-04 DIAGNOSIS — E1149 Type 2 diabetes mellitus with other diabetic neurological complication: Secondary | ICD-10-CM

## 2014-12-04 DIAGNOSIS — E1165 Type 2 diabetes mellitus with hyperglycemia: Secondary | ICD-10-CM

## 2014-12-04 MED ORDER — INSULIN GLARGINE 100 UNIT/ML SOLOSTAR PEN: 30.0000 [IU] | PEN_INJECTOR | Freq: Every day | SUBCUTANEOUS | Status: DC

## 2014-12-04 MED ORDER — ACCU-CHEK FASTCLIX LANCETS MISC
Status: DC
Start: 2014-12-04 — End: 2017-08-04

## 2014-12-04 MED ORDER — CEPHALEXIN 500 MG PO CAPS: 500.0000 mg | ORAL_CAPSULE | Freq: Four times a day (QID) | ORAL | Status: DC

## 2014-12-04 MED ORDER — CEPHALEXIN 500 MG PO CAPS: 500.0000 mg | ORAL_CAPSULE | Freq: Four times a day (QID) | ORAL | Status: AC

## 2014-12-04 NOTE — Progress Notes (Signed)
Patient ID: Susan Holmes, female   DOB: June 16, 1956, 58 y.o.   MRN: [id number]  Subjective:   Patient ID: Susan Holmes female   DOB: 1956-04-16 58 y.o.   MRN: [id number]  HPI: [name] is a 58 y.o. F w/ PMH HTN and DM2 who presents for diabetes follow up.  She was sen 11/25 with hyperglycemia to 585 an was given 10u Novolog at that time. She denied any dietary indiscretions, and had no evidence of infection at that time. Her anion gap was 15,but UA neg for ketones and bicarb normal, so no DKA. She was advised to continue her regular regimen of 24 units of Lantus qhs and Metformin 1083m qam and 5065mqhs.   She returns today for follow up of her blood sugars and states that her CBGs have been even more elevated since her last clinic visit and are running in the 500s. She denies any lows. She still endorses some blurry vision and headaches. She states that she is checking her CBGs 3 times a day and does not have any particular time of the day that her blood sugars are worse than others. She states that she is compliant with her Lantus 24u qhs and is taking Metformin 50012mID, not 1000m82mm. She states that yesterday she ate carrots, sweet potato, and creamed chicken. She is having difficutly affording 3 meals a day and is just eating approx 1 meal a day. She denies drinking juices, reg sodas, or sweet tea and is avoiding sweets. CBGs from her meter are ranging in the 300-500s with no reading below 285.   Pt denies fevers, chills, headaches, vision changes, chest pain, palpitations, SOB, wheezing, abdominal pain, nausea, vomiting, difficulty or pain with urination, peripheral edema, or difficulty ambulating. +pain in R shoulder.   Past Medical History  Diagnosis Date  . Hepatitis B infection pt unsure    resolved  . Diabetes mellitus, type II   . HTN (hypertension)   . Hyperlipidemia   . Congenital blindness     L eye  . Tobacco abuse     .5 PPD smoker  . Anxiety   . Depression    . Arthritis   . Abnormal stress test    Current Outpatient Prescriptions  Medication Sig Dispense Refill  . ACCU-CHEK FASTCLIX LANCETS MISC Check blood sugar 2x a day as instructed dx cod 250.00 insulin requiring 102 each 5  . albuterol-ipratropium (COMBIVENT) 18-103 MCG/ACT inhaler Inhale 2 puffs into the lungs every 4 (four) hours as needed for wheezing or shortness of breath.    . amMarland KitchenODipine (NORVASC) 5 MG tablet TAKE 1 TABLET BY MOUTH DAILY 30 tablet 0  . aspirin EC 81 MG tablet Take 1 tablet (81 mg total) by mouth daily. 30 tablet 12  . Blood Glucose Monitoring Suppl (ACCU-CHEK AVIVA PLUS) W/DEVICE KIT Use to check blood sugar 3 to 4 times daily. diag code 250.62. Insulin dependent 1 kit 0  . ferrous sulfate 325 (65 FE) MG tablet Take 325 mg by mouth daily with breakfast.    . gabapentin (NEURONTIN) 300 MG capsule Take 2 capsules (600 mg total) by mouth 3 (three) times daily. 120 capsule 2  . glucose blood (ACCU-CHEK AVIVA PLUS) test strip Use to test blood sugar 3 times daily. diag code E11.41. Insulin dependent 100 each 11  . glucose blood (ONE TOUCH ULTRA TEST) test strip 1 each by Other route 2 (two) times daily as needed for other. Use as instructed 100 each  11  . Insulin Glargine (LANTUS) 100 UNIT/ML Solostar Pen Inject 24 Units into the skin at bedtime. 15 mL 3  . Insulin Pen Needle (VALUMARK PEN NEEDLES) 31G X 8 MM MISC 1 each by Does not apply route daily. 100 each 3  . LACTOBACILLUS PO Take 1 capsule by mouth daily.    Marland Kitchen loperamide (IMODIUM) 2 MG capsule Take 2 mg by mouth daily as needed for diarrhea or loose stools.    Marland Kitchen losartan (COZAAR) 50 MG tablet Take 50 mg by mouth daily.    . metFORMIN (GLUCOPHAGE) 500 MG tablet Take 1074m (2 tablets) in the morning.  Take 5051m(1 tablet) in the evening. 90 tablet 1  . mirtazapine (REMERON) 15 MG tablet Take 15 mg by mouth at bedtime.     . naproxen (NAPROSYN) 250 MG tablet Take 1 tablet (250 mg total) by mouth 2 (two) times daily  with a meal. 60 tablet 1  . nicotine polacrilex (NICORETTE) 4 MG gum Take 1 each (4 mg total) by mouth as needed for smoking cessation. 100 tablet 0  . pantoprazole (PROTONIX) 40 MG tablet Take 1 tablet (40 mg total) by mouth daily. 30 tablet 0  . traZODone (DESYREL) 100 MG tablet Take 250 mg by mouth at bedtime.      No current facility-administered medications for this visit.   Family History  Problem Relation Age of Onset  . Heart disease Mother 6061. Hypertension Mother   . Heart disease Father 6039. Aneurysm Brother   . Aneurysm Sister   . Colon cancer Neg Hx   . Esophageal cancer Neg Hx   . Stomach cancer Neg Hx    History   Social History  . Marital Status: Divorced    Spouse Name: N/A    Number of Children: 1  . Years of Education: 12   Occupational History  .  Unemployed   Social History Main Topics  . Smoking status: Current Every Day Smoker -- 0.50 packs/day for 20 years    Types: Cigarettes  . Smokeless tobacco: Current User    Types: Chew     Comment: Nicorette gum may be raising blood sugar - smoking about 3 cigs a day  . Alcohol Use: No  . Drug Use: No  . Sexual Activity: Not Currently    Birth Control/ Protection: Surgical   Other Topics Concern  . None   Social History Narrative   Review of Systems: A 12 point ROS was performed; pertinent positives and negatives were noted in the HPI   Objective:  Physical Exam: Filed Vitals:   12/04/14 0911  Height: _0  (1.702 m)  Weight: 175 lb 9.6 oz (79.652 kg)   Constitutional: Vital signs reviewed.  Patient is a well-developed and well-nourished female in no acute distress and cooperative with exam. Alert and oriented x3.  Head: Normocephalic and atraumatic Eyes: PERRL, EOMI. No scleral icterus.  Cardiovascular: RRR, no MRG Pulmonary/Chest: Normal respiratory effort, CTAB, no wheezes, rales, or rhonchi Abdominal: Soft. Non-tender, non-distended, bowel sounds are normal Musculoskeletal: No joint  deformities Neurological: A&O x3, moves all 4 extremities cranial nerve II-XII are grossly intact, no focal motor deficit, Skin: Localized area of 3-22m722maised, erythematous lesions, in semi-circular formation, without scaling, or skin breakdown.  Psychiatric: Normal mood and affect.   Assessment & Plan:   Please refer to Problem List based Assessment and Plan

## 2014-12-04 NOTE — Progress Notes (Signed)
Medicine attending: Medical history, presenting problems, physical findings, and medications, reviewed with resident physician Dr. Katherine Glenn and I concur with her evaluation and management plan. 

## 2014-12-04 NOTE — Assessment & Plan Note (Addendum)
Due for mammogram. Ordered 08/2013 but not done. Mammogram reordered today.

## 2014-12-04 NOTE — Assessment & Plan Note (Addendum)
Pt with persistent shoulder pain and tenderness of the R shoulder; no itching. On exam, R superior shoulder with area of erythema, induration, and tenderness.  Looks almost like a bite mark w/o penetration of the skin. No excoriations. Does not look like tinea corporis. Possible that she has a smoldering skin infection that has been contributing to her hyperglycemia. Will treat for cellulitis-type infection and seen how she does. - Starting Keflex 500mg  q6h x5 days - F/u in 1-2 weeks

## 2014-12-04 NOTE — Assessment & Plan Note (Signed)
Lab Results  Component Value Date   HGBA1C 8.5 11/18/2014   HGBA1C 6.5 08/15/2014   HGBA1C 9.6 02/28/2014     Assessment: Diabetes control: poor control (HgbA1C >9%) Progress toward A1C goal:  unchanged Comments: CBGs in 300-500 on meter, primarily 300-400.  Plan: Medications:  Continue Metformin 500mg  BID, and increase Lantus to 30 units qhs Home glucose monitoring: Frequency:   Timing:   Instruction/counseling given: reminded to bring blood glucose meter & log to each visit, reminded to bring medications to each visit and discussed diet Educational resources provided: brochure, handout Self management tools provided: copy of home glucose meter download Other plans: Check CBGs 3x daily before meals and bring meter to clinic visit in 1-2 weeks.    Susan Holmes is to call the clinic if her CBGs increase or if Susan Holmes begins having lows below 70.

## 2014-12-04 NOTE — Patient Instructions (Addendum)
**Take the Keflex 1 tablet every 6 hours for 5 days for your shoulder infection. If the site does not improve or worsens, please call the clinic.  **Begin taking 30 units of Lantus at night.   **Continue to check your blood sugars at least 3 times a day before meals and bring your meter to your next appointment  **Return to the clinic in 1-2 weeks for a blood sugar recheck.     General Instructions:   Please bring your medicines with you each time you come to clinic.  Medicines may include prescription medications, over-the-counter medications, herbal remedies, eye drops, vitamins, or other pills.   Progress Toward Treatment Goals:  Treatment Goal 10/17/2014  Hemoglobin A1C at goal  Blood pressure at goal  Stop smoking smoking less  Prevent falls -    Self Care Goals & Plans:  Self Care Goal 12/04/2014  Manage my medications take my medicines as prescribed; bring my medications to every visit; refill my medications on time; follow the sick day instructions if I am sick  Monitor my health keep track of my blood glucose; bring my glucose meter and log to each visit; keep track of my blood pressure; keep track of my weight; check my feet daily  Eat healthy foods eat more vegetables; eat fruit for snacks and desserts; eat baked foods instead of fried foods; eat smaller portions; drink diet soda or water instead of juice or soda  Be physically active find an activity I enjoy  Stop smoking -  Prevent falls -  Meeting treatment goals -    Home Blood Glucose Monitoring 10/17/2014  Check my blood sugar -  When to check my blood sugar before meals     Care Management & Community Referrals:  Referral 02/28/2014  Referrals made for care management support none needed  Referrals made to community resources -       Cephalexin tablets or capsules What is this medicine? CEPHALEXIN (sef a LEX in) is a cephalosporin antibiotic. It is used to treat certain kinds of bacterial  infections It will not work for colds, flu, or other viral infections. This medicine may be used for other purposes; ask your health care provider or pharmacist if you have questions. COMMON BRAND NAME(S): Biocef, Keflex, Keftab What should I tell my health care provider before I take this medicine? They need to know if you have any of these conditions: -kidney disease -stomach or intestine problems, especially colitis -an unusual or allergic reaction to cephalexin, other cephalosporins, penicillins, other antibiotics, medicines, foods, dyes or preservatives -pregnant or trying to get pregnant -breast-feeding How should I use this medicine? Take this medicine by mouth with a full glass of water. Follow the directions on the prescription label. This medicine can be taken with or without food. Take your medicine at regular intervals. Do not take your medicine more often than directed. Take all of your medicine as directed even if you think you are better. Do not skip doses or stop your medicine early. Talk to your pediatrician regarding the use of this medicine in children. While this drug may be prescribed for selected conditions, precautions do apply. Overdosage: If you think you have taken too much of this medicine contact a poison control center or emergency room at once. NOTE: This medicine is only for you. Do not share this medicine with others. What if I miss a dose? If you miss a dose, take it as soon as you can. If it is almost time  for your next dose, take only that dose. Do not take double or extra doses. There should be at least 4 to 6 hours between doses. What may interact with this medicine? -probenecid -some other antibiotics This list may not describe all possible interactions. Give your health care provider a list of all the medicines, herbs, non-prescription drugs, or dietary supplements you use. Also tell them if you smoke, drink alcohol, or use illegal drugs. Some items may  interact with your medicine. What should I watch for while using this medicine? Tell your doctor or health care professional if your symptoms do not begin to improve in a few days. Do not treat diarrhea with over the counter products. Contact your doctor if you have diarrhea that lasts more than 2 days or if it is severe and watery. If you have diabetes, you may get a false-positive result for sugar in your urine. Check with your doctor or health care professional. What side effects may I notice from receiving this medicine? Side effects that you should report to your doctor or health care professional as soon as possible: -allergic reactions like skin rash, itching or hives, swelling of the face, lips, or tongue -breathing problems -pain or trouble passing urine -redness, blistering, peeling or loosening of the skin, including inside the mouth -severe or watery diarrhea -unusually weak or tired -yellowing of the eyes, skin Side effects that usually do not require medical attention (report to your doctor or health care professional if they continue or are bothersome): -gas or heartburn -genital or anal irritation -headache -joint or muscle pain -nausea, vomiting This list may not describe all possible side effects. Call your doctor for medical advice about side effects. You may report side effects to FDA at 1-800-FDA-1088. Where should I keep my medicine? Keep out of the reach of children. Store at room temperature between 59 and 86 degrees F (15 and 30 degrees C). Throw away any unused medicine after the expiration date. NOTE: This sheet is a summary. It may not cover all possible information. If you have questions about this medicine, talk to your doctor, pharmacist, or health care provider.  2015, [employer] Standard. (2008-03-18 17:09:13)

## 2014-12-05 ENCOUNTER — Encounter (HOSPITAL_COMMUNITY): Payer: Self-pay | Admitting: Cardiovascular Disease

## 2014-12-06 ENCOUNTER — Other Ambulatory Visit: Payer: Self-pay | Admitting: Internal Medicine

## 2014-12-06 DIAGNOSIS — Z1231 Encounter for screening mammogram for malignant neoplasm of breast: Secondary | ICD-10-CM

## 2014-12-10 ENCOUNTER — Encounter: Payer: Self-pay | Admitting: *Deleted

## 2014-12-11 ENCOUNTER — Ambulatory Visit (INDEPENDENT_AMBULATORY_CARE_PROVIDER_SITE_OTHER): Payer: PRIVATE HEALTH INSURANCE | Admitting: Internal Medicine

## 2014-12-11 ENCOUNTER — Encounter: Payer: Self-pay | Admitting: Internal Medicine

## 2014-12-11 VITALS — BP 108/88 | HR 74 | Temp 98.2°F | Ht 67.0 in | Wt 173.4 lb

## 2014-12-11 DIAGNOSIS — E1165 Type 2 diabetes mellitus with hyperglycemia: Principal | ICD-10-CM

## 2014-12-11 DIAGNOSIS — M19042 Primary osteoarthritis, left hand: Secondary | ICD-10-CM

## 2014-12-11 DIAGNOSIS — E1149 Type 2 diabetes mellitus with other diabetic neurological complication: Secondary | ICD-10-CM

## 2014-12-11 DIAGNOSIS — IMO0002 Reserved for concepts with insufficient information to code with codable children: Secondary | ICD-10-CM

## 2014-12-11 DIAGNOSIS — M1990 Unspecified osteoarthritis, unspecified site: Secondary | ICD-10-CM | POA: Insufficient documentation

## 2014-12-11 DIAGNOSIS — M19041 Primary osteoarthritis, right hand: Secondary | ICD-10-CM

## 2014-12-11 HISTORY — DX: Unspecified osteoarthritis, unspecified site: M19.90

## 2014-12-11 LAB — BASIC METABOLIC PANEL WITH GFR
BUN: 8 mg/dL (ref 6–23)
CO2: 22 mEq/L (ref 19–32)
Calcium: 10 mg/dL (ref 8.4–10.5)
Chloride: 101 mEq/L (ref 96–112)
Creat: 0.88 mg/dL (ref 0.50–1.10)
GFR, Est African American: 83 mL/min
GFR, Est Non African American: 72 mL/min
Glucose, Bld: 362 mg/dL — ABNORMAL HIGH (ref 70–99)
Potassium: 4.6 mEq/L (ref 3.5–5.3)
Sodium: 134 mEq/L — ABNORMAL LOW (ref 135–145)

## 2014-12-11 MED ORDER — DICLOFENAC SODIUM 1 % TD GEL: 2.0000 g | Freq: Four times a day (QID) | TRANSDERMAL | Status: DC

## 2014-12-11 MED ORDER — GLIPIZIDE 5 MG PO TABS: 5.0000 mg | ORAL_TABLET | Freq: Every day | ORAL | Status: DC

## 2014-12-11 MED ORDER — GLUCOSE BLOOD VI STRP: ORAL_STRIP | Status: DC

## 2014-12-11 NOTE — Progress Notes (Signed)
Internal Medicine Clinic Attending  Case discussed with Dr. Glenn soon after the resident saw the patient.  We reviewed the resident's history and exam and pertinent patient test results.  I agree with the assessment, diagnosis, and plan of care documented in the resident's note. 

## 2014-12-11 NOTE — Patient Instructions (Signed)
**  Begin taking the Glipizide 5mg  daily before breakfast.  **Continue to take the Lantus 30 units daily **Be sure to continue to check your blood sugars- try to check them 3-4 times a day: before breakfast, lunch, and dinner, and before bedtime.   **You can use the Voltaren gel on your hands for pain up to 4 times a day. Only use as needed.   General Instructions:   Please bring your medicines with you each time you come to clinic.  Medicines may include prescription medications, over-the-counter medications, herbal remedies, eye drops, vitamins, or other pills.   Progress Toward Treatment Goals:  Treatment Goal 12/04/2014  Hemoglobin A1C unchanged  Blood pressure at goal  Stop smoking -  Prevent falls -    Self Care Goals & Plans:  Self Care Goal 12/11/2014  Manage my medications take my medicines as prescribed; bring my medications to every visit; refill my medications on time; follow the sick day instructions if I am sick  Monitor my health keep track of my blood glucose; bring my glucose meter and log to each visit; keep track of my blood pressure; keep track of my weight; check my feet daily  Eat healthy foods eat more vegetables; eat fruit for snacks and desserts; eat baked foods instead of fried foods; eat smaller portions; drink diet soda or water instead of juice or soda  Be physically active find an activity I enjoy  Stop smoking -  Prevent falls -  Meeting treatment goals -    Home Blood Glucose Monitoring 10/17/2014  Check my blood sugar -  When to check my blood sugar before meals     Care Management & Community Referrals:  Referral 02/28/2014  Referrals made for care management support none needed  Referrals made to community resources -

## 2014-12-11 NOTE — Progress Notes (Signed)
Patient ID: YARIMAR LAVIS, female   DOB: 18-Dec-1956, 58 y.o.   MRN: [id number]  Subjective:   Patient ID: KAIDYNCE PFISTER female   DOB: 05-Jul-1956 58 y.o.   MRN: [id number]  HPI: Ms.Tykeisha L Rickett is a 58 y.o. F w/ PMH HTN and DM2 who presents for diabetes follow up.  She was seen 12/9 for her diabetes and for a local skin rash on her shoulder.  Her A1c had increased to 8.5 from 6.5. She was asked to continue Metformin 55m BID, and increase Lantus to 30 units qhs. She was also asked to check CBGs 3x daily before meals and bring meter to clinic visit in 1-2 weeks, and to call the clinic if her CBGs increased or if she was having lows below 70. She did bring her meter today and has been checking her CBGs once a day. Her CBGs have been pretty much unchanged since increasing her Lantus to 30u qhs. She is drinking a regular Sprite here in the clinic.   For the skin rash, which was painful and indurated, she was treated with Keflex for 7 days. The rash is improved, and she is still taking the abx.   Past Medical History  Diagnosis Date  . Hepatitis B infection pt unsure    resolved  . Diabetes mellitus, type II   . HTN (hypertension)   . Hyperlipidemia   . Congenital blindness     L eye  . Tobacco abuse     .5 PPD smoker  . Anxiety   . Depression   . Arthritis   . Abnormal stress test    Current Outpatient Prescriptions  Medication Sig Dispense Refill  . ACCU-CHEK FASTCLIX LANCETS MISC Check blood sugar 3x a day as instructed dx cod 250.00 insulin requiring 102 each 5  . albuterol-ipratropium (COMBIVENT) 18-103 MCG/ACT inhaler Inhale 2 puffs into the lungs every 4 (four) hours as needed for wheezing or shortness of breath.    .Marland KitchenamLODipine (NORVASC) 5 MG tablet TAKE 1 TABLET BY MOUTH DAILY 30 tablet 0  . aspirin EC 81 MG tablet Take 1 tablet (81 mg total) by mouth daily. 30 tablet 12  . Blood Glucose Monitoring Suppl (ACCU-CHEK AVIVA PLUS) W/DEVICE KIT Use to check blood sugar 3 to 4  times daily. diag code 250.62. Insulin dependent 1 kit 0  . cephALEXin (KEFLEX) 500 MG capsule Take 1 capsule (500 mg total) by mouth 4 (four) times daily.    . diclofenac sodium (VOLTAREN) 1 % GEL Apply 2 g topically 4 (four) times daily. Apply to hands where you are having pain. 100 g 0  . ferrous sulfate 325 (65 FE) MG tablet Take 325 mg by mouth daily with breakfast.    . gabapentin (NEURONTIN) 300 MG capsule Take 2 capsules (600 mg total) by mouth 3 (three) times daily. 120 capsule 2  . glipiZIDE (GLUCOTROL) 5 MG tablet Take 1 tablet (5 mg total) by mouth daily before breakfast. 30 tablet 1  . glucose blood (ACCU-CHEK AVIVA PLUS) test strip Use to test blood sugar 3 times daily. diag code E11.41. Insulin dependent 100 each 11  . glucose blood (ONE TOUCH ULTRA TEST) test strip 1 each by Other route 2 (two) times daily as needed for other. Use as instructed 100 each 11  . Insulin Glargine (LANTUS) 100 UNIT/ML Solostar Pen Inject 30 Units into the skin at bedtime. 15 mL   . Insulin Pen Needle (VALUMARK PEN NEEDLES) 31G X 8 MM MISC 1  each by Does not apply route daily. 100 each 3  . LACTOBACILLUS PO Take 1 capsule by mouth daily.    Marland Kitchen loperamide (IMODIUM) 2 MG capsule Take 2 mg by mouth daily as needed for diarrhea or loose stools.    Marland Kitchen losartan (COZAAR) 50 MG tablet Take 50 mg by mouth daily.    . metFORMIN (GLUCOPHAGE) 500 MG tablet Take 1063m (2 tablets) in the morning.  Take 5061m(1 tablet) in the evening. 90 tablet 1  . mirtazapine (REMERON) 15 MG tablet Take 15 mg by mouth at bedtime.     . naproxen (NAPROSYN) 250 MG tablet Take 1 tablet (250 mg total) by mouth 2 (two) times daily with a meal. 60 tablet 1  . nicotine polacrilex (NICORETTE) 4 MG gum Take 1 each (4 mg total) by mouth as needed for smoking cessation. 100 tablet 0  . pantoprazole (PROTONIX) 40 MG tablet Take 1 tablet (40 mg total) by mouth daily. 30 tablet 0  . traZODone (DESYREL) 100 MG tablet Take 250 mg by mouth at bedtime.       No current facility-administered medications for this visit.   Family History  Problem Relation Age of Onset  . Heart disease Mother 6023. Hypertension Mother   . Heart disease Father 6059. Aneurysm Brother   . Aneurysm Sister   . Colon cancer Neg Hx   . Esophageal cancer Neg Hx   . Stomach cancer Neg Hx    History   Social History  . Marital Status: Divorced    Spouse Name: N/A    Number of Children: 1  . Years of Education: 12   Occupational History  .  Unemployed   Social History Main Topics  . Smoking status: Current Every Day Smoker -- 0.50 packs/day for 20 years    Types: Cigarettes  . Smokeless tobacco: Current User    Types: Chew     Comment: Nicorette gum may be raising blood sugar - smoking about 3 cigs a day  . Alcohol Use: No  . Drug Use: No  . Sexual Activity: Not Currently    Birth Control/ Protection: Surgical   Other Topics Concern  . None   Social History Narrative   Review of Systems: Constitutional: +diaphoresis, appetite change and insomnia.  Respiratory: Denies SOB, DOE Cardiovascular: Denies chest pain, palpitations Gastrointestinal: Denies nausea, vomiting, abdominal pain, diarrhea, constipation Genitourinary: Denies dysuria, urgency, frequency Musculoskeletal: +arthralgias in the hands Skin: Rash is resolving.   Neurological: Denies dizziness, syncope, weakness.  Psychiatric/Behavioral: Denies suicidal ideation or confusion. +irritability.  Objective:  Physical Exam: Filed Vitals:   12/11/14 0925  BP: 108/88  Pulse: 74  Temp: 98.2 F (36.8 C)  TempSrc: Oral  Height: 5' 7" (1.702 m)  Weight: 173 lb 6.4 oz (78.654 kg)  SpO2: 100%   Constitutional: Vital signs reviewed.  Patient is a well-developed and well-nourished female in no acute distress and cooperative with exam. Alert and oriented x3.  Head: Normocephalic and atraumatic Eyes: PERRL, EOMI.  Cardiovascular: RRR, no MRG Pulmonary/Chest: Normal respiratory effort,  CTAB, no wheezes, rales, or rhonchi Abdominal: Soft. Non-tender, non-distended, Musculoskeletal: Ulnar deviation of the right 3rd finger at the DIP. Tenderness to palpation of the PIPs of 1-4th digits in BUE w/o warmth or bogginess. Full ROM of fingers, wrists, and elbows. Neurological: A&O x3, cranial nerve II-XII are grossly intact, no focal motor deficit  Skin: Warm, dry and intact. Rash/infection is almost completely gone, minimal skin discoloration resides  Psychiatric: Normal mood and affect. Speech and behavior is normal.   Assessment & Plan:   Please refer to Problem List based Assessment and Plan

## 2014-12-11 NOTE — Assessment & Plan Note (Addendum)
Lab Results  Component Value Date   HGBA1C 8.5 11/18/2014   HGBA1C 6.5 08/15/2014   HGBA1C 9.6 02/28/2014     Assessment: Diabetes control:  Poor, CBGs in 300-500s since she was last seen.  Progress toward A1C goal:   Unchanged Comments: Pt checking CBGs once a day, either in the morning or   Plan: Medications:  continue current medications (Lantus 30u daily and Metformin 1000mg  BID), Starting Glipizide 5mg  daily Home glucose monitoring: Frequency:   Timing:   Instruction/counseling given: reminded to bring blood glucose meter & log to each visit and discussed diet Educational resources provided: brochure, handout Self management tools provided: copy of home glucose meter download Other plans: Pt to check CBGs 3-4 times a day: before breakfast, lunch, and dinner, and before bedtime.            With her persistently elevated CBGs up to the 500s, checking UA for ketones and BMP to assess for an anion gap- pt with AG 15 in Nov, but   no ketones in the urine.            F/u in 2 weeks.

## 2014-12-11 NOTE — Assessment & Plan Note (Addendum)
Pt with pain in her hands, primarily at the PIPs bilaterally. Naproxen does not improve the pain. On exam, she has ulnar deviation of the right 3rd finger at the DIP. Tenderness to palpation of the PIPs of 1-4th digits in BUE w/o warmth or bogginess. Full ROM of fingers, wrists, and elbows. Renal function normal on BMP 11/20/14. - Start Voltaren gel, applying a small amount to the hands at the site of pain.

## 2014-12-12 LAB — URINALYSIS, MICROSCOPIC ONLY
Bacteria, UA: NONE SEEN
Casts: NONE SEEN
Crystals: NONE SEEN
Squamous Epithelial / LPF: NONE SEEN

## 2014-12-12 LAB — URINALYSIS, ROUTINE W REFLEX MICROSCOPIC
Bilirubin Urine: NEGATIVE
Glucose, UA: >1000 mg/dL — AB
Hgb urine dipstick: NEGATIVE
Ketones, ur: NEGATIVE mg/dL
Leukocytes, UA: NEGATIVE
Nitrite: NEGATIVE
Protein, ur: NEGATIVE mg/dL
Specific Gravity, Urine: 1.03 (ref 1.005–1.030)
Urobilinogen, UA: 0.2 mg/dL (ref 0.0–1.0)
pH: 6 (ref 5.0–8.0)

## 2014-12-14 ENCOUNTER — Other Ambulatory Visit: Payer: Self-pay | Admitting: Oncology

## 2014-12-17 ENCOUNTER — Telehealth: Payer: Self-pay | Admitting: *Deleted

## 2014-12-17 DIAGNOSIS — M19041 Primary osteoarthritis, right hand: Secondary | ICD-10-CM

## 2014-12-17 DIAGNOSIS — M19042 Primary osteoarthritis, left hand: Principal | ICD-10-CM

## 2014-12-17 NOTE — Telephone Encounter (Signed)
Pt calls and states she has not slept in months and needs some rest also she cannot get her arthritis cream, she would like a call from her pcp, you may call her at [phone]

## 2014-12-25 NOTE — Telephone Encounter (Signed)
   The patient did not voice the complaint of insomnia in her last visit on 12/11/14 with Dr Eulas Post. I cannot prescribe a medicine for sleep over the telephone.   Voltaren gel was prescribed by Dr Eulas Post on 12/11/14.

## 2014-12-26 ENCOUNTER — Ambulatory Visit
Admission: RE | Admit: 2014-12-26 | Discharge: 2014-12-26 | Disposition: A | Discharge status: Home / Self Care | Payer: PRIVATE HEALTH INSURANCE | Source: Ambulatory Visit | Attending: Oncology | Admitting: Oncology

## 2014-12-26 DIAGNOSIS — Z1231 Encounter for screening mammogram for malignant neoplasm of breast: Secondary | ICD-10-CM

## 2014-12-31 ENCOUNTER — Encounter: Payer: Self-pay | Admitting: Internal Medicine

## 2014-12-31 ENCOUNTER — Ambulatory Visit (INDEPENDENT_AMBULATORY_CARE_PROVIDER_SITE_OTHER): Payer: Medicare Other | Admitting: Internal Medicine

## 2014-12-31 VITALS — BP 144/88 | HR 66 | Temp 98.1°F | Ht 67.0 in | Wt 173.8 lb

## 2014-12-31 DIAGNOSIS — E1165 Type 2 diabetes mellitus with hyperglycemia: Secondary | ICD-10-CM

## 2014-12-31 DIAGNOSIS — E1149 Type 2 diabetes mellitus with other diabetic neurological complication: Secondary | ICD-10-CM

## 2014-12-31 DIAGNOSIS — G4700 Insomnia, unspecified: Secondary | ICD-10-CM

## 2014-12-31 DIAGNOSIS — F17210 Nicotine dependence, cigarettes, uncomplicated: Secondary | ICD-10-CM

## 2014-12-31 DIAGNOSIS — Z794 Long term (current) use of insulin: Secondary | ICD-10-CM

## 2014-12-31 DIAGNOSIS — IMO0002 Reserved for concepts with insufficient information to code with codable children: Secondary | ICD-10-CM

## 2014-12-31 LAB — GLUCOSE, CAPILLARY: Glucose-Capillary: 338 mg/dL — ABNORMAL HIGH (ref 70–99)

## 2014-12-31 MED ORDER — INSULIN GLARGINE 100 UNIT/ML SOLOSTAR PEN: 35.0000 [IU] | PEN_INJECTOR | Freq: Every day | SUBCUTANEOUS | Status: DC

## 2014-12-31 MED ORDER — ZOLPIDEM TARTRATE 5 MG SL SUBL: 5.0000 mg | SUBLINGUAL_TABLET | SUBLINGUAL | Status: DC | PRN

## 2014-12-31 NOTE — Patient Instructions (Addendum)
Take the zolpidem if you awaken in the middle of the night (it is to be taken sublingually, or under your tongue).    Keep taking your metformin 500 mg twice a day. Increase your lantus to 35 units at bedtime. Take your finger stick before each meal and before bedtime (4 times per day). [name] will check in with you in the next 2 weeks to see how this is helping your sugars.  General Instructions:   Please bring your medicines with you each time you come to clinic.  Medicines may include prescription medications, over-the-counter medications, herbal remedies, eye drops, vitamins, or other pills.   Progress Toward Treatment Goals:  Treatment Goal 12/04/2014  Hemoglobin A1C unchanged  Blood pressure at goal  Stop smoking -  Prevent falls -    Self Care Goals & Plans:  Self Care Goal 12/31/2014  Manage my medications take my medicines as prescribed; bring my medications to every visit; refill my medications on time  Monitor my health keep track of my blood glucose; bring my glucose meter and log to each visit; keep track of my blood pressure; check my feet daily  Eat healthy foods eat baked foods instead of fried foods; eat foods that are low in salt; eat more vegetables; eat fruit for snacks and desserts  Be physically active take a walk every day  Stop smoking set a quit date and stop smoking  Prevent falls -  Meeting treatment goals -    Home Blood Glucose Monitoring 10/17/2014  Check my blood sugar -  When to check my blood sugar before meals     Care Management & Community Referrals:  Referral 02/28/2014  Referrals made for care management support none needed  Referrals made to community resources -

## 2014-12-31 NOTE — Assessment & Plan Note (Signed)
Insomnia: Patient reports 2 months of insomnia, with difficulty staying asleep (no difficulty initiating sleep). Already takes trazodone 50mg  QHS and remeron 15 mg QHS. - Initiated 5 mg sublingual zolpidem to be taken as needed when she awakes in the middle of the night (small supply as trial)

## 2014-12-31 NOTE — Progress Notes (Signed)
Patient ID: Susan Holmes, female   DOB: 1956/01/14, 59 y.o.   MRN: [id number]  HPI: [name] is a 59 yo woman with a history of HTN and DM2 who is presenting for diabetes follow up and insomnia.  She was seen 12/11/14 for diabetes follow up, at which time she was asked to continue her Lantus 30 U QHS, to continue metformin 500 mg BID and to check CBGs 3-4x daily. Her BMP at that time noted a sodium of 134 and a glucose of 362; her UA and microscopic urine revealed glucose >1000, negative ketones and 11-20 WBC. She reports that she has been checking her sugar three times per day and that she takes her medications as prescribed. She cannot recall the last time she missed her insulin or her metformin (it has been months).   She first had difficulty sleeping about 2 months ago. She has found that falling asleep is not difficult, but staying asleep has been a challenge. She finds herself typically going to bed around 10:00PM and waking up at 3:00AM. She is unable to fall back asleep. She has already implemented strategies of sleep hygiene; she has no TV in her bedroom and she has been trying to go to bed at the same time each night. She does report some baseline stressors that may be contributing to her difficulty sleeping. She has been told she snores in the past, but has never completed a sleep study.  Since her last visit, she completed her screening mammography which showed no evidence of malignancy; follow up screening mammogram in one year was recommended.    Past Medical History  Diagnosis Date  . Hepatitis B infection pt unsure    resolved  . Diabetes mellitus, type II   . HTN (hypertension)   . Hyperlipidemia   . Congenital blindness     L eye  . Tobacco abuse     .5 PPD smoker  . Anxiety   . Depression   . Arthritis   . Abnormal stress test      Outpatient Encounter Prescriptions as of 12/31/2014  Medication Sig Note  . ACCU-CHEK  FASTCLIX LANCETS MISC Check blood sugar 3x a day as instructed dx cod 250.00 insulin requiring   . albuterol-ipratropium (COMBIVENT) 18-103 MCG/ACT inhaler Inhale 2 puffs into the lungs every 4 (four) hours as needed for wheezing or shortness of breath.   Marland Kitchen amLODipine (NORVASC) 5 MG tablet TAKE 1 TABLET BY MOUTH DAILY   . aspirin EC 81 MG tablet Take 1 tablet (81 mg total) by mouth daily.   . Blood Glucose Monitoring Suppl (ACCU-CHEK AVIVA PLUS) W/DEVICE KIT Use to check blood sugar 3 to 4 times daily. diag code 250.62. Insulin dependent   . diclofenac sodium (VOLTAREN) 1 % GEL Apply 2 g topically 4 (four) times daily. Apply to hands where you are having pain. (Patient not taking: Reported on 12/11/2014)   . ferrous sulfate 325 (65 FE) MG tablet Take 325 mg by mouth daily with breakfast.   . gabapentin (NEURONTIN) 300 MG capsule Take 2 capsules (600 mg total) by mouth 3 (three) times daily.   Marland Kitchen glipiZIDE (GLUCOTROL) 5 MG tablet Take 1 tablet (5 mg total) by mouth daily before breakfast.   . glucose blood (ACCU-CHEK AVIVA PLUS) test strip Use to test blood sugar 3 times daily. diag code E11.41. Insulin dependent   . Insulin Glargine (LANTUS) 100 UNIT/ML Solostar Pen Inject 30 Units into the skin at bedtime.   Marland Kitchen  Insulin Pen Needle (VALUMARK PEN NEEDLES) 31G X 8 MM MISC 1 each by Does not apply route daily.   Marland Kitchen LACTOBACILLUS PO Take 1 capsule by mouth daily.   Marland Kitchen loperamide (IMODIUM) 2 MG capsule Take 2 mg by mouth daily as needed for diarrhea or loose stools.   Marland Kitchen losartan (COZAAR) 50 MG tablet Take 50 mg by mouth daily.   . metFORMIN (GLUCOPHAGE) 500 MG tablet Take 1069m (2 tablets) in the morning.  Take 5020m(1 tablet) in the evening.   . mirtazapine (REMERON) 15 MG tablet Take 15 mg by mouth at bedtime.  10/07/2014: Received from: External Pharmacy  . naproxen (NAPROSYN) 250 MG tablet Take 1 tablet (250 mg total) by mouth 2 (two) times daily with a meal. (Patient not taking: Reported on 12/11/2014)    . nicotine polacrilex (NICORETTE) 4 MG gum Take 1 each (4 mg total) by mouth as needed for smoking cessation. (Patient not taking: Reported on 12/11/2014)   . pantoprazole (PROTONIX) 40 MG tablet Take 1 tablet (40 mg total) by mouth daily.   . traZODone (DESYREL) 100 MG tablet Take 250 mg by mouth at bedtime.  10/07/2014: Takes 25067mhs   Family History  Problem Relation Age of Onset  . Heart disease Mother 60 90 Hypertension Mother   . Heart disease Father 60 28 Aneurysm Brother   . Aneurysm Sister   . Colon cancer Neg Hx   . Esophageal cancer Neg Hx   . Stomach cancer Neg Hx    History   Social History  . Marital Status: Divorced    Spouse Name: N/A    Number of Children: 1  . Years of Education: 12   Occupational History  .  Unemployed   Social History Main Topics  . Smoking status: Current Every Day Smoker -- 0.50 packs/day for 20 years    Types: Cigarettes  . Smokeless tobacco: Current User    Types: Chew     Comment: Nicorette gum may be raising blood sugar - smoking about 3 cigs a day  . Alcohol Use: No  . Drug Use: No  . Sexual Activity: Not Currently    Birth Control/ Protection: Surgical   Review of Systems: General: insomnia, otherwise no changes Skin: resolved rash HEENT: no headaches Cardiac: no chest pain or palpitations Respiratory: no shortness of breath Gastrointestinal: no nausea, vomiting, abdominal pain, constipation or diarrhea  Urinary: no dysuria, urgency or frequency Msk: + arthralgias in the hands b/l Psychiatric: reports some irritability, no SI or HI or confusion  Physical Exam: Vitals: BP: 144/88, P: 66, SpO2: 100% Appearance: in NAD, well-developed, well-nourished, very concerned about her sleep, does not appear excessively fatigued HEENT: [place]/AT, PERRL, EOMi Heart: RRR, normal S1S2 Lungs: CTAB, normal respiratory effort, no WRR Abdomen:  BS+, soft, nontender, nondistended Extremities: ulnar deviation of the right 3rd finger at the DIP. Tenderness to palpation of the PIPs of 1-4th digits in BUE w/o warmth or bogginess. Full ROM of fingers, wrists, and elbows. Neurologic: A&Ox3, CN II-XII grossly intact, no motor deficits Skin: resolved rash  Please refer to problem based assessment and plan

## 2014-12-31 NOTE — Assessment & Plan Note (Signed)
Type II Diabetes Mellitus with Neurological Manifestations, Uncontrolled: Patient reports compliance with Lantus 30 U QHS and metformin 500 mg BID. She has been taking her FS 1.4 times daily.  - Was encouraged to take her FS before each meal and at bedtime EVERY day - Lantus dose was increased to 35 U QHS - Will have [name] check in on patient in 1-2 weeks - A1c to be completed in early 01/2015 to assess progress; did not initiate short-acting insulin at this time given the patient's lack of strong understanding of her diabetes management, but may need to add a short-acting insulin in the future

## 2014-12-31 NOTE — Progress Notes (Addendum)
Internal Medicine Clinic Attending  Case discussed with Dr. Mallory at the time of the visit.  We reviewed the resident's history and exam and pertinent patient test results.  I agree with the assessment, diagnosis, and plan of care documented in the resident's note. 

## 2015-01-21 ENCOUNTER — Telehealth: Payer: Self-pay | Admitting: Dietician

## 2015-01-21 NOTE — Telephone Encounter (Signed)
Per patient her blood sugars are stlll high "400s", .tired a lot. Hasn't checked it today yet at 11 am. Asked her to check and she says she has not ate or drank anything yet: it was  247 today fasting She is taking lantus 35 units, 500 mg bid of metformin. She says her bottle of Abilify says she should talk to her doctor about taking it if she has diabetes. Started abilify  About 1 month ago Was at mental health today but her doctor was not there.next appointment is  02/11/15.she plans to discuss her high blood sugars. She confirmed she is drinking plenty of fluids.  Confirmed her appointment next Tuesday at 8:54 AM. Told her we'd call if her doctor wants her to change her medications. Consider increasing lantus 5 units.

## 2015-01-27 ENCOUNTER — Telehealth: Payer: Self-pay | Admitting: Internal Medicine

## 2015-01-27 NOTE — Telephone Encounter (Signed)
Call to patient to confirm appointment for 01/28/15 at 8:45 am. Patient Confirmed.

## 2015-01-28 ENCOUNTER — Encounter: Payer: Self-pay | Admitting: Internal Medicine

## 2015-01-28 ENCOUNTER — Encounter: Payer: Self-pay | Admitting: Licensed Clinical Social Worker

## 2015-01-28 ENCOUNTER — Ambulatory Visit: Payer: Medicare Other | Admitting: Internal Medicine

## 2015-01-28 ENCOUNTER — Ambulatory Visit (INDEPENDENT_AMBULATORY_CARE_PROVIDER_SITE_OTHER): Payer: Medicare Other | Admitting: Internal Medicine

## 2015-01-28 VITALS — BP 126/79 | HR 68 | Temp 97.7°F | Ht 67.0 in | Wt 172.3 lb

## 2015-01-28 DIAGNOSIS — Z794 Long term (current) use of insulin: Secondary | ICD-10-CM | POA: Diagnosis not present

## 2015-01-28 DIAGNOSIS — IMO0002 Reserved for concepts with insufficient information to code with codable children: Secondary | ICD-10-CM

## 2015-01-28 DIAGNOSIS — E1149 Type 2 diabetes mellitus with other diabetic neurological complication: Secondary | ICD-10-CM

## 2015-01-28 DIAGNOSIS — E1165 Type 2 diabetes mellitus with hyperglycemia: Secondary | ICD-10-CM | POA: Diagnosis not present

## 2015-01-28 MED ORDER — DIPHENHYDRAMINE HCL 25 MG PO CAPS: 25.0000 mg | ORAL_CAPSULE | Freq: Three times a day (TID) | ORAL | Status: DC | PRN

## 2015-01-28 NOTE — Progress Notes (Signed)
Susan Holmes was referred to [place] by physician today during pt's scheduled [facility] visit.  Pt informed physician of only eating one meal a day due to lack of food.  CSW met with Susan Holmes, pt states she receives $16 a month in food stamps.  CSW provided pt with bag of food from [facility] pantry, pt requesting breakfast specific options.  CSW provided Susan Holmes with local food pantry and hot meal listings.  Pt denies add'l social work needs at this time.

## 2015-01-28 NOTE — Progress Notes (Signed)
Subjective:   Patient ID: Susan Holmes female   DOB: 1956/09/02 59 y.o.   MRN: [id number]  HPI: [name] is a 59 y.o. woman pmh as listed below presents for DM recheck.   DM - Patient checking blood sugars 2-3 times daily, before breakfast and dinner. She brings in her CBG meter for review. And during this conversation the patient states that she only eats once a day given her inability to access food. She still having mostly average readings in the 300s with a couple of her lowest in the 150s during the afternoon. She states that she wakes up early around 7 AM and goes to bed around 10 PM and only eats once at about 6 PM. Therefore she is having all of her lower CBG readings from 10 until 5:30. She does not snack during this time but continues to take her medications as prescribed. Currently taking Lantus 35 units daily at bedtime and metformin 500 mg twice a day. No hypoglycemic episodes since last visit. denies polyuria, polydipsia, nausea, vomiting, diarrhea.  does not request refills today.  Her other complaint today is some skin itchiness around her arms and cheeks. This has started since this no storm about 2 weeks ago and has slightly improved with lotion but she feels that the itching keeps her up and preoccupied.   Past Medical History  Diagnosis Date  . Hepatitis B infection pt unsure    resolved  . Diabetes mellitus, type II   . HTN (hypertension)   . Hyperlipidemia   . Congenital blindness     L eye  . Tobacco abuse     .5 PPD smoker  . Anxiety   . Depression   . Arthritis   . Abnormal stress test    Current Outpatient Prescriptions  Medication Sig Dispense Refill  . ACCU-CHEK FASTCLIX LANCETS MISC Check blood sugar 3x a day as instructed dx cod 250.00 insulin requiring 102 each 5  . albuterol-ipratropium (COMBIVENT) 18-103 MCG/ACT inhaler Inhale 2 puffs into the lungs every 4 (four) hours as needed for wheezing or shortness of breath.    Marland Kitchen amLODipine  (NORVASC) 5 MG tablet TAKE 1 TABLET BY MOUTH DAILY 30 tablet 0  . aspirin EC 81 MG tablet Take 1 tablet (81 mg total) by mouth daily. 30 tablet 12  . Blood Glucose Monitoring Suppl (ACCU-CHEK AVIVA PLUS) W/DEVICE KIT Use to check blood sugar 3 to 4 times daily. diag code 250.62. Insulin dependent 1 kit 0  . diclofenac sodium (VOLTAREN) 1 % GEL Apply 2 g topically 4 (four) times daily. Apply to hands where you are having pain. (Patient not taking: Reported on 12/11/2014) 100 g 0  . ferrous sulfate 325 (65 FE) MG tablet Take 325 mg by mouth daily with breakfast.    . gabapentin (NEURONTIN) 300 MG capsule Take 2 capsules (600 mg total) by mouth 3 (three) times daily. 120 capsule 2  . glipiZIDE (GLUCOTROL) 5 MG tablet Take 1 tablet (5 mg total) by mouth daily before breakfast. 30 tablet 1  . glucose blood (ACCU-CHEK AVIVA PLUS) test strip Use to test blood sugar 3 times daily. diag code E11.41. Insulin dependent 100 each 11  . Insulin Glargine (LANTUS) 100 UNIT/ML Solostar Pen Inject 35 Units into the skin at bedtime. 15 mL   . Insulin Pen Needle (VALUMARK PEN NEEDLES) 31G X 8 MM MISC 1 each by Does not apply route daily. 100 each 3  . LACTOBACILLUS PO Take 1 capsule  by mouth daily.    Marland Kitchen loperamide (IMODIUM) 2 MG capsule Take 2 mg by mouth daily as needed for diarrhea or loose stools.    Marland Kitchen losartan (COZAAR) 50 MG tablet Take 50 mg by mouth daily.    . metFORMIN (GLUCOPHAGE) 500 MG tablet Take 1035m (2 tablets) in the morning.  Take 5069m(1 tablet) in the evening. 90 tablet 1  . mirtazapine (REMERON) 15 MG tablet Take 15 mg by mouth at bedtime.     . naproxen (NAPROSYN) 250 MG tablet Take 1 tablet (250 mg total) by mouth 2 (two) times daily with a meal. (Patient not taking: Reported on 12/11/2014) 60 tablet 1  . nicotine polacrilex (NICORETTE) 4 MG gum Take 1 each (4 mg total) by mouth as needed for smoking cessation. (Patient not taking: Reported on 12/11/2014) 100 tablet 0  . pantoprazole (PROTONIX)  40 MG tablet Take 1 tablet (40 mg total) by mouth daily. 30 tablet 0  . traZODone (DESYREL) 100 MG tablet Take 250 mg by mouth at bedtime.     . Zolpidem Tartrate 5 MG SUBL Place 1 tablet (5 mg total) under the tongue as needed (Take upon awakening as needed). 14 tablet 0   No current facility-administered medications for this visit.   Family History  Problem Relation Age of Onset  . Heart disease Mother 6060. Hypertension Mother   . Heart disease Father 6068. Aneurysm Brother   . Aneurysm Sister   . Colon cancer Neg Hx   . Esophageal cancer Neg Hx   . Stomach cancer Neg Hx    History   Social History  . Marital Status: Divorced    Spouse Name: N/A    Number of Children: 1  . Years of Education: 12   Occupational History  .  Unemployed   Social History Main Topics  . Smoking status: Current Every Day Smoker -- 0.50 packs/day for 20 years    Types: Cigarettes  . Smokeless tobacco: Current User    Types: Chew     Comment: Nicorette gum.  . Marland Kitchenlcohol Use: No  . Drug Use: No  . Sexual Activity: Not Currently    Birth Control/ Protection: Surgical   Other Topics Concern  . None   Social History Narrative   Review of Systems: Pertinent items are noted in HPI. Objective:  Physical Exam: Filed Vitals:   01/28/15 0851  BP: 126/79  Pulse: 68  Temp: 97.7 F (36.5 C)  TempSrc: Oral  Height: '5\' 7"'  (1.702 m)  Weight: 172 lb 4.8 oz (78.155 kg)  SpO2: 100%  General: sitting in chair, NAD HEENT: PERRL, EOMI, no scleral icterus Cardiac: RRR, no rubs, murmurs or gallops Pulm: clear to auscultation bilaterally, moving normal volumes of air Abd: soft, nontender, nondistended, BS present Ext: warm and well perfused, no pedal edema, no skin rashes  Neuro: alert and oriented X3, cranial nerves II-XII grossly intact  Assessment & Plan:  Please see problem oriented charting  Pt discussed with Dr. NaDareen Piano

## 2015-01-28 NOTE — Patient Instructions (Signed)
General Instructions:   Thank you for bringing your medicines today. This helps [place] keep you safe from mistakes.  For your diabetes we will do the following: -Increase your Lantus to 40 units at bedtime -Continue to take your metformin 500 mg twice a day -We will provide you with some food today and also some places to get food when you go home -We'll follow-up in one month to recheck your sugars  Progress Toward Treatment Goals:  Treatment Goal 12/04/2014  Hemoglobin A1C unchanged  Blood pressure at goal  Stop smoking -  Prevent falls -    Self Care Goals & Plans:  Self Care Goal 01/28/2015  Manage my medications take my medicines as prescribed; bring my medications to every visit; refill my medications on time; follow the sick day instructions if I am sick  Monitor my health keep track of my blood glucose; bring my glucose meter and log to each visit; keep track of my blood pressure; keep track of my weight; check my feet daily  Eat healthy foods eat more vegetables; eat fruit for snacks and desserts; eat baked foods instead of fried foods; eat smaller portions; drink diet soda or water instead of juice or soda  Be physically active find an activity I enjoy  Stop smoking -  Prevent falls -  Meeting treatment goals -    Home Blood Glucose Monitoring 10/17/2014  Check my blood sugar -  When to check my blood sugar before meals     Care Management & Community Referrals:  Referral 02/28/2014  Referrals made for care management support none needed  Referrals made to community resources -

## 2015-01-28 NOTE — Progress Notes (Signed)
INTERNAL MEDICINE TEACHING ATTENDING ADDENDUM - Xzavior Reinig, MD: I reviewed and discussed at the time of visit with the resident Dr. Sadek, the patient's medical history, physical examination, diagnosis and results of pertinent tests and treatment and I agree with the patient's care as documented.  

## 2015-01-28 NOTE — Assessment & Plan Note (Signed)
Lab Results  Component Value Date   HGBA1C 8.5 11/18/2014   HGBA1C 6.5 08/15/2014   HGBA1C 9.6 02/28/2014     Assessment: Diabetes control:   Progress toward A1C goal:    Comments: decline  Plan: Medications:  Increase Lantus to 40 units daily at bedtime and continue metformin 500 mg twice a day Home glucose monitoring: Frequency:   Timing:   Instruction/counseling given: reminded to get eye exam, reminded to bring blood glucose meter & log to each visit, reminded to bring medications to each visit and discussed diet Educational resources provided: brochure, handout Self management tools provided: copy of home glucose meter download Other plans: The patient states that she has had no access to food and is only eating about once a day therefore resources were provided today. Also it appears that the patient has been having an increase in her CBG readings since starting Abilify which has also been known to cause some hyperglycemia it was recommended that she speak to her psychiatrist to possibly make changes to this medication. Follow-up in one month

## 2015-02-01 ENCOUNTER — Encounter (HOSPITAL_COMMUNITY): Payer: Self-pay

## 2015-02-01 ENCOUNTER — Emergency Department (HOSPITAL_COMMUNITY)
Admission: EM | Admit: 2015-02-01 | Discharge: 2015-02-01 | Disposition: A | Discharge status: Home / Self Care | Payer: Medicare Other | Attending: Emergency Medicine | Admitting: Emergency Medicine

## 2015-02-01 DIAGNOSIS — H538 Other visual disturbances: Secondary | ICD-10-CM | POA: Insufficient documentation

## 2015-02-01 DIAGNOSIS — Z7952 Long term (current) use of systemic steroids: Secondary | ICD-10-CM | POA: Diagnosis not present

## 2015-02-01 DIAGNOSIS — Z79899 Other long term (current) drug therapy: Secondary | ICD-10-CM | POA: Diagnosis not present

## 2015-02-01 DIAGNOSIS — F419 Anxiety disorder, unspecified: Secondary | ICD-10-CM | POA: Insufficient documentation

## 2015-02-01 DIAGNOSIS — F329 Major depressive disorder, single episode, unspecified: Secondary | ICD-10-CM | POA: Insufficient documentation

## 2015-02-01 DIAGNOSIS — H5713 Ocular pain, bilateral: Secondary | ICD-10-CM | POA: Insufficient documentation

## 2015-02-01 DIAGNOSIS — H409 Unspecified glaucoma: Secondary | ICD-10-CM | POA: Insufficient documentation

## 2015-02-01 DIAGNOSIS — I10 Essential (primary) hypertension: Secondary | ICD-10-CM | POA: Insufficient documentation

## 2015-02-01 DIAGNOSIS — Z7982 Long term (current) use of aspirin: Secondary | ICD-10-CM | POA: Diagnosis not present

## 2015-02-01 DIAGNOSIS — H540 Blindness, both eyes: Secondary | ICD-10-CM | POA: Insufficient documentation

## 2015-02-01 DIAGNOSIS — Z8619 Personal history of other infectious and parasitic diseases: Secondary | ICD-10-CM | POA: Diagnosis not present

## 2015-02-01 DIAGNOSIS — Z720 Tobacco use: Secondary | ICD-10-CM | POA: Insufficient documentation

## 2015-02-01 DIAGNOSIS — Z9889 Other specified postprocedural states: Secondary | ICD-10-CM | POA: Insufficient documentation

## 2015-02-01 DIAGNOSIS — H53149 Visual discomfort, unspecified: Secondary | ICD-10-CM | POA: Diagnosis not present

## 2015-02-01 DIAGNOSIS — M1990 Unspecified osteoarthritis, unspecified site: Secondary | ICD-10-CM | POA: Insufficient documentation

## 2015-02-01 DIAGNOSIS — E119 Type 2 diabetes mellitus without complications: Secondary | ICD-10-CM | POA: Insufficient documentation

## 2015-02-01 DIAGNOSIS — Z791 Long term (current) use of non-steroidal anti-inflammatories (NSAID): Secondary | ICD-10-CM | POA: Diagnosis not present

## 2015-02-01 DIAGNOSIS — Z794 Long term (current) use of insulin: Secondary | ICD-10-CM | POA: Insufficient documentation

## 2015-02-01 HISTORY — DX: Unspecified glaucoma: H40.9

## 2015-02-01 LAB — CBG MONITORING, ED: Glucose-Capillary: 163 mg/dL — ABNORMAL HIGH (ref 70–99)

## 2015-02-01 MED ORDER — FLUORESCEIN SODIUM 1 MG OP STRP
1.0000 | ORAL_STRIP | Freq: Once | OPHTHALMIC | Status: AC
  Administered 2015-02-01: 1 via OPHTHALMIC
  Filled 2015-02-01: qty 1

## 2015-02-01 MED ORDER — PREDNISOLONE ACETATE 1 % OP SUSP: 1.0000 [drp] | Freq: Two times a day (BID) | OPHTHALMIC | Status: DC

## 2015-02-01 MED ORDER — TETRACAINE HCL 0.5 % OP SOLN
2.0000 [drp] | Freq: Once | OPHTHALMIC | Status: AC
  Administered 2015-02-01: 2 [drp] via OPHTHALMIC
  Filled 2015-02-01: qty 2

## 2015-02-01 MED ORDER — OXYCODONE-ACETAMINOPHEN 5-325 MG PO TABS: 1.0000 | ORAL_TABLET | Freq: Four times a day (QID) | ORAL | Status: DC | PRN

## 2015-02-01 MED ORDER — OXYCODONE-ACETAMINOPHEN 5-325 MG PO TABS
1.0000 | ORAL_TABLET | Freq: Once | ORAL | Status: AC
  Administered 2015-02-01: 1 via ORAL
  Filled 2015-02-01: qty 1

## 2015-02-01 NOTE — ED Provider Notes (Addendum)
CSN: [id number]     Arrival date & time 02/01/15  1152 History   First MD Initiated Contact with Patient 02/01/15 1238     Chief Complaint  Patient presents with  . Eye Pain  . Blurred Vision  . Hyperglycemia     (Consider location/radiation/quality/duration/timing/severity/associated sxs/prior Treatment) Patient is a 59 y.o. female presenting with eye pain and hyperglycemia. The history is provided by the patient.  Eye Pain This is a new problem. The current episode started 3 to 5 hours ago. The problem occurs constantly. The problem has not changed since onset.Pertinent negatives include no chest pain, no abdominal pain, no headaches and no shortness of breath. Nothing aggravates the symptoms. Nothing relieves the symptoms. She has tried nothing for the symptoms. The treatment provided no relief.  Hyperglycemia Associated symptoms: no abdominal pain, no chest pain, no dizziness, no dysuria, no fatigue, no fever, no nausea, no shortness of breath and no vomiting     Past Medical History  Diagnosis Date  . Hepatitis B infection pt unsure    resolved  . Diabetes mellitus, type II   . HTN (hypertension)   . Hyperlipidemia   . Congenital blindness     L eye  . Tobacco abuse     .5 PPD smoker  . Anxiety   . Depression   . Arthritis   . Abnormal stress test   . Glaucoma    Past Surgical History  Procedure Laterality Date  . Abdominal hysterectomy    . Left heart catheterization with coronary angiogram N/A 10/09/2014    Procedure: LEFT HEART CATHETERIZATION WITH CORONARY ANGIOGRAM;  Surgeon: Troy Sine, MD;  Location: [facility] C[facility] LAB;  Service: Cardiovascular;  Laterality: N/A;   Family History  Problem Relation Age of Onset  . Heart disease Mother 110  . Hypertension Mother   . Heart disease Father 73  . Aneurysm Brother   . Aneurysm Sister   . Colon cancer Neg Hx   . Esophageal cancer Neg Hx   . Stomach cancer Neg Hx    History  Substance Use Topics  . Smoking status:  Current Every Day Smoker -- 0.50 packs/day for 20 years    Types: Cigarettes  . Smokeless tobacco: Current User    Types: Chew     Comment: Nicorette gum.  Marland Kitchen Alcohol Use: No   OB History    No data available     Review of Systems  Constitutional: Negative for fever and fatigue.  HENT: Negative for congestion and drooling.   Eyes: Positive for photophobia, pain and visual disturbance.  Respiratory: Negative for cough and shortness of breath.   Cardiovascular: Negative for chest pain.  Gastrointestinal: Negative for nausea, vomiting, abdominal pain and diarrhea.  Genitourinary: Negative for dysuria and hematuria.  Musculoskeletal: Negative for back pain, gait problem and neck pain.  Skin: Negative for color change.  Neurological: Negative for dizziness and headaches.  Hematological: Negative for adenopathy.  Psychiatric/Behavioral: Negative for behavioral problems.  All other systems reviewed and are negative.     Allergies  Ace inhibitors  Home Medications   Prior to Admission medications   Medication Sig Start Date End Date Taking? Authorizing Provider  albuterol-ipratropium (COMBIVENT) 18-103 MCG/ACT inhaler Inhale 2 puffs into the lungs every 4 (four) hours as needed for wheezing or shortness of breath.   Yes Historical Provider, MD  amLODipine (NORVASC) 5 MG tablet TAKE 1 TABLET BY MOUTH DAILY 11/15/14  Yes Annia Belt, MD  ARIPiprazole (ABILIFY) 5  MG tablet Take 5 mg by mouth daily.  01/23/15  Yes Historical Provider, MD  aspirin EC 81 MG tablet Take 1 tablet (81 mg total) by mouth daily. 03/08/14  Yes Olga Millers, MD  brimonidine (ALPHAGAN) 0.2 % ophthalmic solution Place 1 drop into both eyes 2 (two) times daily.   Yes Historical Provider, MD  dorzolamide-timolol (COSOPT) 22.3-6.8 MG/ML ophthalmic solution Place 1 drop into both eyes 2 (two) times daily.   Yes Historical Provider, MD  glipiZIDE (GLUCOTROL) 5 MG tablet Take 1 tablet (5 mg total) by mouth  daily before breakfast. 12/11/14 12/11/15 Yes Otho Bellows, MD  Insulin Glargine (LANTUS) 100 UNIT/ML Solostar Pen Inject 35 Units into the skin at bedtime. 12/31/14  Yes Drucilla Schmidt, MD  latanoprost (XALATAN) 0.005 % ophthalmic solution Place 1 drop into both eyes 2 (two) times daily.   Yes Historical Provider, MD  losartan (COZAAR) 50 MG tablet Take 50 mg by mouth daily.   Yes Historical Provider, MD  metFORMIN (GLUCOPHAGE) 500 MG tablet Take 1041m (2 tablets) in the morning.  Take 5064m(1 tablet) in the evening. 11/20/14  Yes AlFrancesca OmanDO  mirtazapine (REMERON) 15 MG tablet Take 15 mg by mouth at bedtime.  09/15/14  Yes Historical Provider, MD  Multiple Vitamin (MULTIVITAMIN WITH MINERALS) TABS tablet Take 1 tablet by mouth daily.   Yes Historical Provider, MD  naproxen (NAPROSYN) 250 MG tablet Take 1 tablet (250 mg total) by mouth 2 (two) times daily with a meal. 09/19/14  Yes SoBlain PaisMD  pantoprazole (PROTONIX) 40 MG tablet Take 1 tablet (40 mg total) by mouth daily. 10/09/14  Yes ErLucious GrovesDO  prednisoLONE acetate (PRED FORTE) 1 % ophthalmic suspension Place 1 drop into both eyes 2 (two) times daily.   Yes Historical Provider, MD  traZODone (DESYREL) 100 MG tablet Take 250 mg by mouth at bedtime.    Yes Historical Provider, MD  ACCU-CHEK FASTCLIX LANCETS MISC Check blood sugar 3x a day as instructed dx cod 250.00 insulin requiring 12/04/14   KaOtho BellowsMD  Blood Glucose Monitoring Suppl (ACCU-CHEK AVIVA PLUS) W/DEVICE KIT Use to check blood sugar 3 to 4 times daily. diag code 250.62. Insulin dependent Patient not taking: Reported on 02/01/2015 09/11/14   NiAldine ContesMD  diclofenac sodium (VOLTAREN) 1 % GEL Apply 2 g topically 4 (four) times daily. Apply to hands where you are having pain. Patient not taking: Reported on 12/11/2014 12/11/14   KaOtho BellowsMD  diphenhydrAMINE (BENADRYL) 25 mg capsule Take 1 capsule (25 mg total) by mouth every 8 (eight)  hours as needed. Patient not taking: Reported on 02/01/2015 01/28/15 01/28/16  NoClinton GallantMD  gabapentin (NEURONTIN) 300 MG capsule Take 2 capsules (600 mg total) by mouth 3 (three) times daily. Patient not taking: Reported on 02/01/2015 09/04/14 09/04/15  SoBlain PaisMD  glucose blood (ACCU-CHEK AVIVA PLUS) test strip Use to test blood sugar 3 times daily. diag code E11.41. Insulin dependent 12/11/14   KaOtho BellowsMD  Insulin Pen Needle (VALUMARK PEN NEEDLES) 31G X 8 MM MISC 1 each by Does not apply route daily. 11/20/14   AlFrancesca OmanDO  nicotine polacrilex (NICORETTE) 4 MG gum Take 1 each (4 mg total) by mouth as needed for smoking cessation. Patient not taking: Reported on 12/11/2014 08/15/14   JuDrucilla SchmidtMD  Zolpidem Tartrate 5 MG SUBL Place 1 tablet (5 mg total) under the tongue as needed (  Take upon awakening as needed). Patient not taking: Reported on 02/01/2015 12/31/14   Drucilla Schmidt, MD   BP 152/94 mmHg  Pulse 68  Temp(Src) 98 F (36.7 C) (Oral)  Resp 17  SpO2 98% Physical Exam  Constitutional: She is oriented to person, place, and time. She appears well-developed and well-nourished.  HENT:  Head: Normocephalic.  Mouth/Throat: Oropharynx is clear and moist. No oropharyngeal exudate.  Likely mild supraorbital swelling.  Eyes: EOM are normal. Pupils are equal, round, and reactive to light.  Mild conjunctival injection bilaterally. Mild tearing from the eyes.  Neck: Normal range of motion. Neck supple.  Cardiovascular: Normal rate, regular rhythm, normal heart sounds and intact distal pulses.  Exam reveals no gallop and no friction rub.   No murmur heard. Pulmonary/Chest: Effort normal and breath sounds normal. No respiratory distress. She has no wheezes.  Abdominal: Soft. Bowel sounds are normal. There is no tenderness. There is no rebound and no guarding.  Musculoskeletal: Normal range of motion. She exhibits no edema or tenderness.  Neurological: She is alert and  oriented to person, place, and time.  alert, oriented x3 speech: normal in context and clarity memory: intact grossly cranial nerves II-XII: intact motor strength: full proximally and distally no involuntary movements or tremors sensation: intact to light touch diffusely  cerebellar: finger-to-nose and heel-to-shin intact gait: normal   Skin: Skin is warm and dry.  Psychiatric: She has a normal mood and affect. Her behavior is normal.  Nursing note and vitals reviewed.   ED Course  Procedures (including critical care time) Labs Review Labs Reviewed  CBG MONITORING, ED - Abnormal; Notable for the following:    Glucose-Capillary 163 (*)    All other components within normal limits    Imaging Review No results found.   EKG Interpretation None      MDM   Final diagnoses:  Eye pain, bilateral    12:59 PM 59 y.o. female w hx of DM, HTN, Hep B, glaucoma who awoke this morning with bilateral eye pain, bilateral watery drainage, and mild blurry vision. She notes her eyes have an aching sensation in the left one feels as if there is something in it. She does have some mild photophobia. She is afebrile and vital signs are unremarkable here. Vision is 20/200 in the left eye and 20/40 in the right eye on my exam. She notes that she chronically has very bad vision in her left eye. We'll plan on staining both eyes and checking her intraocular pressures. She notes her blood sugars through 2 this morning. CBG here shows a blood sugar of 163.  The patient also notes mild supraorbital swelling which is just barely noticeable on my exam. Possible non-specific conjunctivitis given the tearing and conjunctival injection. Doubt glaucoma w/ IOP 12 in right eye and 14 in Left eye. No abrasions upon staining both eyes. Doubt orbital cellulitis given well appearance w/out surrounding erythema.   2:22 PM: Will provide Rx for her opth prednisolone which ran out of several days ago. I have discussed the  diagnosis/risks/treatment options with the patient and believe the pt to be eligible for discharge home to follow-up with her eye doctor on monday. We also discussed returning to the ED immediately if new or worsening sx occur. We discussed the sx which are most concerning (e.g., worsening vision, eye pain, fever, redness) that necessitate immediate return. Medications administered to the patient during their visit and any new prescriptions provided to the patient are listed below.  Medications given during this visit Medications  fluorescein ophthalmic strip 1 strip (1 strip Both Eyes Given 02/01/15 1305)  tetracaine (PONTOCAINE) 0.5 % ophthalmic solution 2 drop (2 drops Both Eyes Given 02/01/15 1305)  oxyCODONE-acetaminophen (PERCOCET/ROXICET) 5-325 MG per tablet 1 tablet (1 tablet Oral Given 02/01/15 1344)    Discharge Medication List as of 02/01/2015  2:22 PM    START taking these medications   Details  oxyCODONE-acetaminophen (PERCOCET) 5-325 MG per tablet Take 1 tablet by mouth every 6 (six) hours as needed., Starting 02/01/2015, Until Discontinued, Print         Pamella Pert, MD 02/01/15 [place], MD 02/01/15 1530

## 2015-02-01 NOTE — Discharge Instructions (Signed)
Follow-up with your eye doctor on Monday. Return here for any worsening vision, worsening pain, fever, redness around the eyes, increased discharge from the eye.

## 2015-02-01 NOTE — ED Notes (Addendum)
Pt c/o bilateral eye pain, clear discharge, and blurred vision starting last night.  Pain score 9/10.  Hx of glaucoma, HTN, and DM.  Pt reports "my sugar has been running high.  It was 302 this morning."

## 2015-02-03 DIAGNOSIS — H109 Unspecified conjunctivitis: Secondary | ICD-10-CM | POA: Diagnosis not present

## 2015-02-05 DIAGNOSIS — H10409 Unspecified chronic conjunctivitis, unspecified eye: Secondary | ICD-10-CM | POA: Diagnosis not present

## 2015-02-06 ENCOUNTER — Other Ambulatory Visit: Payer: Self-pay | Admitting: Internal Medicine

## 2015-02-12 DIAGNOSIS — H4011X3 Primary open-angle glaucoma, severe stage: Secondary | ICD-10-CM | POA: Diagnosis not present

## 2015-02-12 DIAGNOSIS — H2513 Age-related nuclear cataract, bilateral: Secondary | ICD-10-CM | POA: Diagnosis not present

## 2015-02-12 DIAGNOSIS — H4011X1 Primary open-angle glaucoma, mild stage: Secondary | ICD-10-CM | POA: Diagnosis not present

## 2015-02-19 DIAGNOSIS — H4011X3 Primary open-angle glaucoma, severe stage: Secondary | ICD-10-CM | POA: Diagnosis not present

## 2015-02-19 DIAGNOSIS — H4011X1 Primary open-angle glaucoma, mild stage: Secondary | ICD-10-CM | POA: Diagnosis not present

## 2015-02-26 ENCOUNTER — Encounter: Payer: Self-pay | Admitting: Internal Medicine

## 2015-02-26 ENCOUNTER — Ambulatory Visit (INDEPENDENT_AMBULATORY_CARE_PROVIDER_SITE_OTHER): Payer: Medicare Other | Admitting: Internal Medicine

## 2015-02-26 VITALS — BP 143/84 | HR 80 | Temp 97.8°F | Ht 67.0 in | Wt 176.8 lb

## 2015-02-26 DIAGNOSIS — E1149 Type 2 diabetes mellitus with other diabetic neurological complication: Secondary | ICD-10-CM

## 2015-02-26 DIAGNOSIS — IMO0002 Reserved for concepts with insufficient information to code with codable children: Secondary | ICD-10-CM

## 2015-02-26 DIAGNOSIS — E1165 Type 2 diabetes mellitus with hyperglycemia: Principal | ICD-10-CM

## 2015-02-26 MED ORDER — LOSARTAN POTASSIUM 50 MG PO TABS: 50.0000 mg | ORAL_TABLET | Freq: Every day | ORAL | Status: DC

## 2015-02-26 MED ORDER — METFORMIN HCL 1000 MG PO TABS: ORAL_TABLET | ORAL | Status: DC

## 2015-02-26 MED ORDER — INSULIN GLARGINE 100 UNIT/ML SOLOSTAR PEN: 35.0000 [IU] | PEN_INJECTOR | Freq: Every day | SUBCUTANEOUS | Status: DC

## 2015-02-26 MED ORDER — GLIPIZIDE 10 MG PO TABS: ORAL_TABLET | ORAL | Status: DC

## 2015-02-26 NOTE — Patient Instructions (Signed)
Your Metformin has been changed to 1000mg  (1 tablet) 2 times a day, and your Glipizide has been increased to 10mg  daily.  Continue to check your blood sugars 2 times a day and call the clinic if you have low blood sugars less than 70.  Try to stop drinking regular sodas, this will help improve your blood sugars as well.   Return to the clinic in 1 month and bring your meter with you.   General Instructions:   Please bring your medicines with you each time you come to clinic.  Medicines may include prescription medications, over-the-counter medications, herbal remedies, eye drops, vitamins, or other pills.   Progress Toward Treatment Goals:  Treatment Goal 12/04/2014  Hemoglobin A1C unchanged  Blood pressure at goal  Stop smoking -  Prevent falls -    Self Care Goals & Plans:  Self Care Goal 01/28/2015  Manage my medications take my medicines as prescribed; bring my medications to every visit; refill my medications on time; follow the sick day instructions if I am sick  Monitor my health keep track of my blood glucose; bring my glucose meter and log to each visit; keep track of my blood pressure; keep track of my weight; check my feet daily  Eat healthy foods eat more vegetables; eat fruit for snacks and desserts; eat baked foods instead of fried foods; eat smaller portions; drink diet soda or water instead of juice or soda  Be physically active find an activity I enjoy  Stop smoking -  Prevent falls -  Meeting treatment goals -    Home Blood Glucose Monitoring 10/17/2014  Check my blood sugar -  When to check my blood sugar before meals     Care Management & Community Referrals:  Referral 02/28/2014  Referrals made for care management support none needed  Referrals made to community resources -

## 2015-02-26 NOTE — Progress Notes (Signed)
Patient ID: Susan Holmes, female   DOB: Dec 04, 1956, 59 y.o.   MRN: [id number]  Subjective:   Patient ID: Susan Holmes female   DOB: 09-20-1956 60 y.o.   MRN: [id number]  HPI: Susan Holmes is a 59 y.o. F w/ PMH Hep B, glaucoma, HTN and DM2 who presents for diabetes follow up.  She was seen in the ED 2/6 c/o hyperglycemia and blurry vision with eye pain. Eye exam was unremarkable and w/o abrasions and normal occular pressures; the pt had run out of her eye drops, which were refilled and she was asked to f/u with her eye doctor which she did go see and was told that her glaucoma is stable. Her CBG was actually not elevated and was 163 in the ED.  Today she is complaining that she did not receive her testing supplies. Prescriptions for supplies were sent to the medical equipment company dated for October.   She states that her CBGs have been running in 100 and up to 400. She is on 40u qhs of Lantus, glipizide and metformin. She is checking her CBGs BID. She is drinking a regular Pepsi here in the clinic and declines a CBG check or A1c though she is due for both.    Past Medical History  Diagnosis Date  . Hepatitis B infection pt unsure    resolved  . Diabetes mellitus, type II   . HTN (hypertension)   . Hyperlipidemia   . Congenital blindness     L eye  . Tobacco abuse     .5 PPD smoker  . Anxiety   . Depression   . Arthritis   . Abnormal stress test   . Glaucoma    Current Outpatient Prescriptions  Medication Sig Dispense Refill  . ACCU-CHEK FASTCLIX LANCETS MISC Check blood sugar 3x a day as instructed dx cod 250.00 insulin requiring 102 each 5  . albuterol-ipratropium (COMBIVENT) 18-103 MCG/ACT inhaler Inhale 2 puffs into the lungs every 4 (four) hours as needed for wheezing or shortness of breath.    Marland Kitchen amLODipine (NORVASC) 5 MG tablet TAKE 1 TABLET BY MOUTH DAILY 30 tablet 0  . aspirin EC 81 MG tablet Take 1 tablet (81 mg total) by mouth daily. 30 tablet 12  . Blood  Glucose Monitoring Suppl (ACCU-CHEK AVIVA PLUS) W/DEVICE KIT Use to check blood sugar 3 to 4 times daily. diag code 250.62. Insulin dependent 1 kit 0  . brimonidine (ALPHAGAN) 0.2 % ophthalmic solution Place 1 drop into both eyes 2 (two) times daily.    . dorzolamide-timolol (COSOPT) 22.3-6.8 MG/ML ophthalmic solution Place 1 drop into both eyes 2 (two) times daily.    Marland Kitchen glipiZIDE (GLUCOTROL) 10 MG tablet TAKE 1 TABLET BY MOUTH EVERY DAY BEFORE BREAKFAST 30 tablet 1  . glucose blood (ACCU-CHEK AVIVA PLUS) test strip Use to test blood sugar 3 times daily. diag code E11.41. Insulin dependent 100 each 11  . Insulin Glargine (LANTUS) 100 UNIT/ML Solostar Pen Inject 35 Units into the skin at bedtime. 15 mL 0  . Insulin Pen Needle (VALUMARK PEN NEEDLES) 31G X 8 MM MISC 1 each by Does not apply route daily. 100 each 3  . latanoprost (XALATAN) 0.005 % ophthalmic solution Place 1 drop into both eyes 2 (two) times daily.    Marland Kitchen losartan (COZAAR) 50 MG tablet Take 1 tablet (50 mg total) by mouth daily. 30 tablet 3  . metFORMIN (GLUCOPHAGE) 1000 MG tablet Take 1067m (1 tablets) 2 times a day.  60 tablet 11  . mirtazapine (REMERON) 15 MG tablet Take 15 mg by mouth at bedtime.     . nicotine polacrilex (NICORETTE) 4 MG gum Take 1 each (4 mg total) by mouth as needed for smoking cessation. 100 tablet 0  . diclofenac sodium (VOLTAREN) 1 % GEL Apply 2 g topically 4 (four) times daily. Apply to hands where you are having pain. (Patient not taking: Reported on 12/11/2014) 100 g 0  . diphenhydrAMINE (BENADRYL) 25 mg capsule Take 1 capsule (25 mg total) by mouth every 8 (eight) hours as needed. (Patient not taking: Reported on 02/01/2015) 24 capsule 2  . gabapentin (NEURONTIN) 300 MG capsule Take 2 capsules (600 mg total) by mouth 3 (three) times daily. (Patient not taking: Reported on 02/01/2015) 120 capsule 2  . Multiple Vitamin (MULTIVITAMIN WITH MINERALS) TABS tablet Take 1 tablet by mouth daily.    . naproxen (NAPROSYN)  250 MG tablet Take 1 tablet (250 mg total) by mouth 2 (two) times daily with a meal. (Patient not taking: Reported on 02/26/2015) 60 tablet 1  . oxyCODONE-acetaminophen (PERCOCET) 5-325 MG per tablet Take 1 tablet by mouth every 6 (six) hours as needed. (Patient not taking: Reported on 02/26/2015) 15 tablet 0  . pantoprazole (PROTONIX) 40 MG tablet Take 1 tablet (40 mg total) by mouth daily. (Patient not taking: Reported on 02/26/2015) 30 tablet 0  . prednisoLONE acetate (PRED FORTE) 1 % ophthalmic suspension Place 1 drop into both eyes 2 (two) times daily.    . prednisoLONE acetate (PRED FORTE) 1 % ophthalmic suspension Place 1 drop into both eyes 2 (two) times daily. (Patient not taking: Reported on 02/26/2015) 5 mL 0  . traZODone (DESYREL) 100 MG tablet Take 250 mg by mouth at bedtime.     . Zolpidem Tartrate 5 MG SUBL Place 1 tablet (5 mg total) under the tongue as needed (Take upon awakening as needed). (Patient not taking: Reported on 02/01/2015) 14 tablet 0   No current facility-administered medications for this visit.   Family History  Problem Relation Age of Onset  . Heart disease Mother 78  . Hypertension Mother   . Heart disease Father 68  . Aneurysm Brother   . Aneurysm Sister   . Colon cancer Neg Hx   . Esophageal cancer Neg Hx   . Stomach cancer Neg Hx    History   Social History  . Marital Status: Divorced    Spouse Name: N/A  . Number of Children: 1  . Years of Education: 12   Occupational History  .  Unemployed   Social History Main Topics  . Smoking status: Current Every Day Smoker -- 0.30 packs/day for 20 years    Types: Cigarettes  . Smokeless tobacco: Current User    Types: Chew     Comment: Nicorette gum.  Marland Kitchen Alcohol Use: No  . Drug Use: No  . Sexual Activity: Not Currently    Birth Control/ Protection: Surgical   Other Topics Concern  . None   Social History Narrative   Review of Systems: A 12 point ROS was performed; pertinent positives and negatives are  noted below:  +eye pain, unchanged +DOE +dizziness x few months Denies chest pain, abd pain, N/V/D, trouble urinating, or gait abnormalities.    Objective:  Physical Exam: Filed Vitals:   02/26/15 1009  BP: 143/84  Pulse: 80  Temp: 97.8 F (36.6 C)  TempSrc: Oral  Height: '5\' 7"'  (1.702 m)  Weight: 176 lb 12.8 oz (  80.196 kg)  SpO2: 99%   Constitutional: Vital signs reviewed.  Patient is a well-developed and well-nourished female in no acute distress and cooperative with exam. Alert and oriented x3.  Head: Normocephalic and atraumatic Eyes: EOMI Neck: Normal ROM Cardiovascular: RRR, no MRG, pulses symmetric and intact bilaterally Pulmonary/Chest: Normal respiratory effort, CTAB, no wheezes, rales, or rhonchi Abdominal: Soft. Non-tender, non-distended, bowel sounds are normal, no guarding present.  Musculoskeletal: No joint deformities  Neurological: A&O x3, cranial nerve II-XII are grossly intact, no focal motor deficit  Skin: Warm, dry and intact.  Psychiatric: Normal mood and affect. Speech and behavior is normal.   Assessment & Plan:   Please refer to Problem List based Assessment and Plan

## 2015-02-26 NOTE — Progress Notes (Signed)
Case discussed with Dr. Glenn soon after the resident saw the patient.  We reviewed the resident's history and exam and pertinent patient test results.  I agree with the assessment, diagnosis, and plan of care documented in the resident's note. 

## 2015-02-26 NOTE — Assessment & Plan Note (Signed)
Lab Results  Component Value Date   HGBA1C 8.5 11/18/2014   HGBA1C 6.5 08/15/2014   HGBA1C 9.6 02/28/2014     Assessment: Diabetes control: poor control (HgbA1C >9%) Progress toward A1C goal:  unable to assess Comments: Pt declined A1c today and is drinking a Pepsi in the exam room.  Plan: Medications:  Continue Lantus 35u qhs, increasing Metformin to 1000mg  BID and glipizide to 10mg  daily. Home glucose monitoring: Frequency: 2 times a day Timing: before meals Instruction/counseling given: reminded to bring blood glucose meter & log to each visit and discussed diet Educational resources provided:   Self management tools provided: instructions for home glucose monitoring Other plans: Pt to continue to check CBGs BID and bring her meter with her to see her PCP in 3 weeks to 1 month. She was asked to call the clinic if her CBGs drop below 70.

## 2015-03-05 DIAGNOSIS — H4011X3 Primary open-angle glaucoma, severe stage: Secondary | ICD-10-CM | POA: Diagnosis not present

## 2015-03-05 DIAGNOSIS — H4011X1 Primary open-angle glaucoma, mild stage: Secondary | ICD-10-CM | POA: Diagnosis not present

## 2015-03-05 DIAGNOSIS — H2513 Age-related nuclear cataract, bilateral: Secondary | ICD-10-CM | POA: Diagnosis not present

## 2015-03-05 LAB — HM DIABETES EYE EXAM

## 2015-03-09 ENCOUNTER — Other Ambulatory Visit: Payer: Self-pay | Admitting: Internal Medicine

## 2015-03-12 DIAGNOSIS — H4011X2 Primary open-angle glaucoma, moderate stage: Secondary | ICD-10-CM | POA: Diagnosis not present

## 2015-03-12 DIAGNOSIS — H2513 Age-related nuclear cataract, bilateral: Secondary | ICD-10-CM | POA: Diagnosis not present

## 2015-03-12 DIAGNOSIS — H4011X3 Primary open-angle glaucoma, severe stage: Secondary | ICD-10-CM | POA: Diagnosis not present

## 2015-03-12 DIAGNOSIS — H5703 Miosis: Secondary | ICD-10-CM | POA: Diagnosis not present

## 2015-03-17 ENCOUNTER — Other Ambulatory Visit (INDEPENDENT_AMBULATORY_CARE_PROVIDER_SITE_OTHER): Payer: Medicare Other

## 2015-03-17 ENCOUNTER — Telehealth: Payer: Self-pay | Admitting: *Deleted

## 2015-03-17 ENCOUNTER — Ambulatory Visit (INDEPENDENT_AMBULATORY_CARE_PROVIDER_SITE_OTHER): Payer: Medicare Other | Admitting: Dietician

## 2015-03-17 VITALS — Wt 168.5 lb

## 2015-03-17 DIAGNOSIS — Z794 Long term (current) use of insulin: Secondary | ICD-10-CM | POA: Diagnosis not present

## 2015-03-17 DIAGNOSIS — E1165 Type 2 diabetes mellitus with hyperglycemia: Secondary | ICD-10-CM

## 2015-03-17 DIAGNOSIS — E1149 Type 2 diabetes mellitus with other diabetic neurological complication: Secondary | ICD-10-CM

## 2015-03-17 DIAGNOSIS — IMO0002 Reserved for concepts with insufficient information to code with codable children: Secondary | ICD-10-CM

## 2015-03-17 DIAGNOSIS — E1141 Type 2 diabetes mellitus with diabetic mononeuropathy: Secondary | ICD-10-CM

## 2015-03-17 LAB — LIPID PANEL
Cholesterol: 231 mg/dL — ABNORMAL HIGH (ref 0–200)
HDL: 38 mg/dL — ABNORMAL LOW (ref >=46)
LDL Cholesterol: 124 mg/dL — ABNORMAL HIGH (ref 0–99)
Total CHOL/HDL Ratio: 6.1 Ratio
Triglycerides: 346 mg/dL — ABNORMAL HIGH (ref <150)
VLDL: 69 mg/dL — ABNORMAL HIGH (ref 0–40)

## 2015-03-17 LAB — GLUCOSE, CAPILLARY: Glucose-Capillary: 478 mg/dL — ABNORMAL HIGH (ref 70–99)

## 2015-03-17 LAB — POCT GLYCOSYLATED HEMOGLOBIN (HGB A1C): Hemoglobin A1C: >14

## 2015-03-17 NOTE — Progress Notes (Signed)
Medical Nutrition Therapy:  Appt start time: 1030 end time:  1130.  Assessment:  Primary concerns today: blood sugar control.  Patient is here with her daughter because her blood sugar was too high this am for her to have cataract surgery. She says she has been taking all her diabetes meidcaine without fail and not eating very much. Her weight has dropped gradually 20-30# over past 2-4 years per patient due to decreased intake. Her brother no longer lives with her. Her  During demonstration patient left pen needle on insulin pen after her injection and admits to sometimes reusing needles. She also forgot to take inside needle cover off at first. Otherwise, she did fine with her insulin pen use demonstration. Her diet is reasonable for her as is her weight.   Labs & CBGs: no meter today but says her CBgs at home have been in 500s and Hi recently a1C today is >14 and it was fair and well controlled prior to 10/2014 when it started to increase for unknown reasons. Patient associates it with starting eye drops.   Lab Results  Component Value Date   HGBA1C >14.0 03/17/2015   HGBA1C 8.5 11/18/2014   HGBA1C 6.5 08/15/2014   Lab Results  Component Value Date   MICROALBUR 1.86 08/15/2014   LDLCALC NOT CALC 02/28/2014   CREATININE 0.88 12/11/2014   Preferred Learning Style: No preference indicated  Learning Readiness: Contemplating/Ready   MEDICATIONS: says she uses a pill box and is sure she has not missed nay doses of either glipizde, metformin or lantus, has stomach upset with metformin and 2 bowel movements a day. Demonstrated accurate drawing up of 40 units and administration using insulin pen despite ophthalmolpgy saying she is essentially blind both eyes from glaucoma and cataracts.  Patient on remeron, but has not noticed increase in appetite Calculated TDD of insulin  39- 93 units a day, 50% of which should be basal and 50% bolus. Basal should be 20- max of 46, bolus about the same.    DIETARY INTAKE: Usual eating pattern includes 2 meals and 0-1 snacks per day.  Lives alone. shops one time a month spends ~ 65$. Gets 16$ food stamps and goes to [employer] every other month and a food truck that is free food twice a month.  Says she eats canned foods that she did not include in her 24 hours food recall, has plenty  of bread and peranubutter  Everyday foods include easy, frozen dinners, fruit. .  Avoided foods include peanut butter.   24-hr recall:  Bedtime 1`0 PM, arises ~ 8 AM, awakened about 10 times to urinate, but doesn't eat or drink during night B (8:30 AM): coffee with cream and 2 packs splenda  D (supper) ( 3PM): chicken or pasta  lean cuisine with broccoli and cheese,  Snk ( 7-8 PM): apple or other fruit and 3 cheese sticks Beverages: coffee x1 cup a day, water, 2 cans of soda/day- regular and diet soda(at first said 50% of each then says more diet than regular)   Usual physical activity: "none" because  of her vision    Progress Towards Goal(s):  In progress.   Nutritional Diagnosis:  [place]-2.1 Inpaired nutrition utilization As related to prolonged hyperglycemia.  As evidenced by her elevated a1C and weight loss. .    Intervention:  Nutrition assessment and education about how to lower her blood sugars, how to record blood sugars. Coordination of care- will obtain a diabetes medication refill history. IF patient  is taking her insulin and diabetes medicine then Suggest prandial insulin at her two meals. Start with 5 units each meal with a decrease in her basal insulin to 30 units or more. Suspect she is having loose stools from the metformin.  Teaching Method Utilized: Visual,  Auditory, Hands on Handouts given during visit include:bag to bring her meter and medications back to our office, glucose log and AVS Barriers to learning/adherence to lifestyle change: finances, mental illness Demonstrated degree of understanding via:  Teach Back    Monitoring/Evaluation:  Dietary intake, exercise, meter and medications, and body weight in 2 day(s).

## 2015-03-17 NOTE — Patient Instructions (Signed)
Please bring your meter with you  Check your blood before breakfast, before your 3 Pm meal( supper), before 7-8 Pm snack and again before bedtime (9:30ish)  Please write them on paper given to you  Please do not drink regular soda for the next few days, Drink water, tea with yellow pack sweetener, coffee, diet soda or crystal lite like packs  Take metformin with food- best to eat a little somethig with coffee in the morning- like peanut butter or egg toast  BRING all your pill bottles, your pill box and your insulin on Thursday.

## 2015-03-17 NOTE — Telephone Encounter (Signed)
Pt and daughter walked in to clinic - sch for eye surgery today - cancel due to CBG 460. Talked with Dr Marinda Elk - decided to meet with [name] Plyler today 10AM - will do fasting CBG, A1C and lipid profile per Debera Lat. To keep appt with Dr Sherrine Maples 03/20/15 - daughter is welcome. Pt states she is taking meds correctly. Quan Cybulski R$N 03/17/15 9:20AM

## 2015-03-19 ENCOUNTER — Telehealth: Payer: Self-pay | Admitting: Internal Medicine

## 2015-03-19 NOTE — Telephone Encounter (Signed)
Call to patient to confirm appointment for 03/20/15 at 1:45 lmtcb

## 2015-03-20 ENCOUNTER — Ambulatory Visit (INDEPENDENT_AMBULATORY_CARE_PROVIDER_SITE_OTHER): Payer: Medicare Other | Admitting: Internal Medicine

## 2015-03-20 ENCOUNTER — Telehealth: Payer: Self-pay | Admitting: Dietician

## 2015-03-20 ENCOUNTER — Encounter: Payer: Self-pay | Admitting: Internal Medicine

## 2015-03-20 VITALS — BP 146/87 | HR 72 | Temp 98.1°F | Wt 176.8 lb

## 2015-03-20 DIAGNOSIS — E1149 Type 2 diabetes mellitus with other diabetic neurological complication: Secondary | ICD-10-CM | POA: Diagnosis not present

## 2015-03-20 DIAGNOSIS — I10 Essential (primary) hypertension: Secondary | ICD-10-CM

## 2015-03-20 DIAGNOSIS — E785 Hyperlipidemia, unspecified: Secondary | ICD-10-CM | POA: Diagnosis not present

## 2015-03-20 DIAGNOSIS — IMO0002 Reserved for concepts with insufficient information to code with codable children: Secondary | ICD-10-CM

## 2015-03-20 DIAGNOSIS — E1165 Type 2 diabetes mellitus with hyperglycemia: Secondary | ICD-10-CM

## 2015-03-20 LAB — GLUCOSE, CAPILLARY: Glucose-Capillary: 194 mg/dL — ABNORMAL HIGH (ref 70–99)

## 2015-03-20 MED ORDER — ROSUVASTATIN CALCIUM 20 MG PO TABS: 20.0000 mg | ORAL_TABLET | Freq: Every day | ORAL | Status: DC

## 2015-03-20 MED ORDER — INSULIN LISPRO 100 UNIT/ML ~~LOC~~ SOLN: 5.0000 [IU] | Freq: Two times a day (BID) | SUBCUTANEOUS | Status: DC

## 2015-03-20 NOTE — Assessment & Plan Note (Signed)
BP Readings from Last 3 Encounters:  03/20/15 146/87  02/26/15 143/84  02/01/15 156/100    Lab Results  Component Value Date   NA 134* 12/11/2014   K 4.6 12/11/2014   CREATININE 0.88 12/11/2014    Assessment: Blood pressure control: stable   Progress toward BP goal:  decent   Comments: Patient reports medication compliance.   Plan: Medications:  continue current medications; multiple diabetes medications were changed today and do not want to change blood pressure medications at this point as well. Other plans:  - discussed dietary choices

## 2015-03-20 NOTE — Patient Instructions (Addendum)
Please take your metformin 1000 mg twice per day as you have been taking.  Keep taking your lantus at night, but only take 35 units (instead of 40 units).  Add 5 units of Humalog with breakfast and with dinner.   STOP taking your glipizide.   On Sunday night (before your rescheduled eye surgery), take your lantus 35 units as usual. Do not eat after midnight and do not take any nogolog or metformin the next morning.    Start taking rosuvastatin 20 mg at bedtime for your cholesterol.

## 2015-03-20 NOTE — Telephone Encounter (Signed)
Patient's pharmacy ([facility] # [phone] called and verified consistent 30 day supply refills of lantus, , metformin and glipizide. last refills an all 3 was 03/17/15.

## 2015-03-20 NOTE — Telephone Encounter (Signed)
Also consulted Dr. [name], Sherian Rein D who reviewed patient's medication list and found it unlikely (<1% incidence)  that any of her other medicines were causing an increase in glucose.

## 2015-03-20 NOTE — Assessment & Plan Note (Addendum)
Lab Results  Component Value Date   HGBA1C >14.0 03/17/2015   HGBA1C 8.5 11/18/2014   HGBA1C 6.5 08/15/2014     Assessment: Diabetes control: poor   Progress toward A1C goal: no progress    Comments: Patient's recent cataract extraction cancelled due to high BG  Plan: Medications:  Patient's glipizide stopped, lantus decreased (40 U --> 35 U at night), humalog added (5 units before breakfast and dinner), continue metformin at current dose Home glucose monitoring: Frequency: twice per day   Timing: before meals   Instruction/counseling given: reminded to bring blood glucose meter & log to each visit Educational resources provided:   Self management tools provided: copy of home glucose meter download Other plans:  - Humalog added (5 U before breakfast and dinner) - Decrease lantus dose (40 U --> 35 U at night) - Stop glipizide - Continue metformin at current dose - Patient encouraged to apply pressure to lacrimal sac (taught how to do this) after applying her pred forte eye drop so as to avoid systemic absorption (in case this is contributing to her elevated sugars) - Patient to re-attempt cataract extraction on Monday, 3/28 and will follow up in clinic after that for diabetes re-assessment

## 2015-03-20 NOTE — Progress Notes (Signed)
[facility] Subjective:   Patient ID: Susan Holmes female   DOB: 1956-04-06 59 y.o.   MRN: [id number]  HPI: Susan Holmes is a 59 y.o. female with a PMH of DM2, hypertension, glaucoma, congenital left eye blindness and current smoking who presents for diabetes management. She met with Debera Lat three days ago (3/21) after her ophthalmologic surgery was cancelled due to her blood glucose in the 400s when she presented for cataract extraction.   She has been struggling with her diabetes management for quite some time. Her most recent increase in diabetes medications came on 3/2 when her metformin was increased to 1000 mg BID, her glipizide was increased to 10 mg daily and her lantus was maintained at 35 units daily. Since then, she had an ED visit where her blurred vision was attributed to her hyperglycemia.  She reports compliance with her medications and that she has cut down on her soda intake. She has even lost 30 pounds over the past 2.5 years (currently 168). She still uses sugar in her coffee in the morning and we discussed better options for her.   On 3/21, [name] was able to determine that the patient has been filling her diabetes medication prescriptions every month at her pharmacy; however, her A1c that day resulted at >14%. She has very occasional hypoglycemic episodes (one every few months), where she experiences sweating and nausea and finds her finger stick to be in the 50s or 60s.   Furthermore, Ms. Maher was taken off of her atorvastatin 40 mg by mouth daily in 10/2014 due to muscle aches and it does not appear that she has been started on an alternative medication. Her most recent lipid panel came back with cholesterol 231, triglycerides 346, HDL 38 and LDL 124.   Past Medical History  Diagnosis Date  . Hepatitis B infection Hep B surface antigen + in 2009, indicating recovery/immunity     resolved  . Diabetes mellitus, type II   . HTN  (hypertension)   . Hyperlipidemia   . Congenital blindness     L eye  . Tobacco abuse     .5 PPD smoker  . Anxiety   . Depression   . Arthritis   . Abnormal stress test   . Glaucoma    Current Outpatient Prescriptions (updated on this visit)  Medication Sig Dispense Refill  . ACCU-CHEK FASTCLIX LANCETS MISC Check blood sugar 3x a day as instructed dx cod 250.00 insulin requiring 102 each 5  . albuterol-ipratropium (COMBIVENT) 18-103 MCG/ACT inhaler Inhale 2 puffs into the lungs every 4 (four) hours as needed for wheezing or shortness of breath.    Marland Kitchen amLODipine (NORVASC) 5 MG tablet TAKE 1 TABLET BY MOUTH DAILY 30 tablet 0  . aspirin 81 MG EC tablet TAKE 1 TABLET BY MOUTH EVERY DAY 30 tablet 0  . Blood Glucose Monitoring Suppl (ACCU-CHEK AVIVA PLUS) W/DEVICE KIT Use to check blood sugar 3 to 4 times daily. diag code 250.62. Insulin dependent 1 kit 0  . brimonidine (ALPHAGAN) 0.2 % ophthalmic solution Place 1 drop into both eyes 2 (two) times daily.    . diclofenac sodium (VOLTAREN) 1 % GEL Apply 2 g topically 4 (four) times daily. Apply to hands where you are having pain. (Patient not taking: Reported on 12/11/2014) 100 g 0  . diphenhydrAMINE (BENADRYL) 25 mg capsule Take 1 capsule (25 mg total) by mouth every 8 (eight) hours as needed. (Patient not taking: Reported on 02/01/2015)  24 capsule 2  . dorzolamide-timolol (COSOPT) 22.3-6.8 MG/ML ophthalmic solution Place 1 drop into both eyes 2 (two) times daily.    Marland Kitchen gabapentin (NEURONTIN) 300 MG capsule Take 2 capsules (600 mg total) by mouth 3 (three) times daily. (Patient not taking: Reported on 02/01/2015) 120 capsule 2  . glucose blood (ACCU-CHEK AVIVA PLUS) test strip Use to test blood sugar 3 times daily. diag code E11.41. Insulin dependent 100 each 11  . Insulin Glargine (LANTUS) 100 UNIT/ML Solostar Pen Inject 35 Units into the skin at bedtime. 15 mL 0  . insulin lispro (HUMALOG) 100 UNIT/ML injection Inject 0.05 mLs (5 Units total) into the  skin 2 (two) times daily before a meal. 10 mL 11  . Insulin Pen Needle (VALUMARK PEN NEEDLES) 31G X 8 MM MISC 1 each by Does not apply route daily. 100 each 3  . latanoprost (XALATAN) 0.005 % ophthalmic solution Place 1 drop into both eyes 2 (two) times daily.    Marland Kitchen losartan (COZAAR) 50 MG tablet Take 1 tablet (50 mg total) by mouth daily. 30 tablet 3  . metFORMIN (GLUCOPHAGE) 1000 MG tablet Take 1031m (1 tablets) 2 times a day. 60 tablet 11  . mirtazapine (REMERON) 15 MG tablet Take 15 mg by mouth at bedtime.     . Multiple Vitamin (MULTIVITAMIN WITH MINERALS) TABS tablet Take 1 tablet by mouth daily.    . naproxen (NAPROSYN) 250 MG tablet Take 1 tablet (250 mg total) by mouth 2 (two) times daily with a meal. (Patient not taking: Reported on 02/26/2015) 60 tablet 1  . nicotine polacrilex (NICORETTE) 4 MG gum Take 1 each (4 mg total) by mouth as needed for smoking cessation. 100 tablet 0  . oxyCODONE-acetaminophen (PERCOCET) 5-325 MG per tablet Take 1 tablet by mouth every 6 (six) hours as needed. (Patient not taking: Reported on 02/26/2015) 15 tablet 0  . pantoprazole (PROTONIX) 40 MG tablet Take 1 tablet (40 mg total) by mouth daily. (Patient not taking: Reported on 02/26/2015) 30 tablet 0  . prednisoLONE acetate (PRED FORTE) 1 % ophthalmic suspension Place 1 drop into both eyes 2 (two) times daily.    . prednisoLONE acetate (PRED FORTE) 1 % ophthalmic suspension Place 1 drop into both eyes 2 (two) times daily. (Patient not taking: Reported on 02/26/2015) 5 mL 0  . rosuvastatin (CRESTOR) 20 MG tablet Take 1 tablet (20 mg total) by mouth at bedtime. 30 tablet 11  . traZODone (DESYREL) 100 MG tablet Take 250 mg by mouth at bedtime.     . Zolpidem Tartrate 5 MG SUBL Place 1 tablet (5 mg total) under the tongue as needed (Take upon awakening as needed). (Patient not taking: Reported on 02/01/2015) 14 tablet 0   No current facility-administered medications for this visit.   Family History  Problem Relation  Age of Onset  . Heart disease Mother 651 . Hypertension Mother   . Heart disease Father 699 . Aneurysm Brother   . Aneurysm Sister   . Colon cancer Neg Hx   . Esophageal cancer Neg Hx   . Stomach cancer Neg Hx    History   Social History  . Marital Status: Divorced    Spouse Name: N/A  . Number of Children: 1  . Years of Education: 12   Occupational History  .  Unemployed   Social History Main Topics  . Smoking status: Current Every Day Smoker -- 0.30 packs/day for 20 years    Types: Cigarettes  .  Smokeless tobacco: Current User    Types: Chew     Comment: Nicorette gum.  Marland Kitchen Alcohol Use: No  . Drug Use: No  . Sexual Activity: Not Currently    Birth Control/ Protection: Surgical   Other Topics Concern  . None   Social History Narrative   Review of Systems: General: no recent illness, recently missed cataract extraction surgery due to her elevated glucose, feels fatigued often Skin: no rashes or lesions HEENT: no recent headaches  Cardiac: no chest pain or palpitations Respiratory: no shortness of breath GI: no changes in bowel movements, nausea or vomiting (except during hypoglycemic episode where she felt nauseous) Urinary: no dysuria or hematuria Msk: joint pain in her right wrist Psychiatric: history of depression   Objective:  Physical Exam: Filed Vitals:   03/20/15 1447  BP: 146/87  Pulse: 72  Temp: 98.1 F (36.7 C)  TempSrc: Oral  Weight: 176 lb 12.8 oz (80.196 kg)  SpO2: 98%  Appearance: in NAD, listening to music on her phone, no diaphoresis HEENT: AT/[place], PERRL, EOMi Heart: RRR, normal S1S2 Lungs: CTAB, no wheezes Abdomen: bowel sounds present, soft, nontender Musculoskeletal: no deformity or joint swelling in the right wrist, normal range of motion throughout Extremities: no edema Neurologic: A&Ox3, grossly intact Skin: no skin changes  Assessment & Plan:  Case discussed with Dr. Eppie Gibson   Essential hypertension BP Readings from Last 3  Encounters:  03/20/15 146/87  02/26/15 143/84  02/01/15 156/100    Lab Results  Component Value Date   NA 134* 12/11/2014   K 4.6 12/11/2014   CREATININE 0.88 12/11/2014    Assessment: Blood pressure control: stable   Progress toward BP goal:  decent   Comments: Patient reports medication compliance.   Plan: Medications:  continue current medications; multiple diabetes medications were changed today and do not want to change blood pressure medications at this point as well. Other plans:  - discussed dietary choices    Type II diabetes mellitus with neurological manifestations, uncontrolled Lab Results  Component Value Date   HGBA1C >14.0 03/17/2015   HGBA1C 8.5 11/18/2014   HGBA1C 6.5 08/15/2014     Assessment: Diabetes control: poor   Progress toward A1C goal: no progress    Comments: Patient's recent cataract extraction cancelled due to high BG  Plan: Medications:  Patient's glipizide stopped, lantus decreased (40 U --> 35 U at night), humalog added (5 units before breakfast and dinner), continue metformin at current dose Home glucose monitoring: Frequency: twice per day   Timing: before meals   Instruction/counseling given: reminded to bring blood glucose meter & log to each visit Educational resources provided:   Self management tools provided: copy of home glucose meter download Other plans:  - Humalog added (5 U before breakfast and dinner) - Decrease lantus dose (40 U --> 35 U at night) - Stop glipizide - Continue metformin at current dose - Patient encouraged to apply pressure to lacrimal sac (taught how to do this) after applying her pred forte eye drop so as to avoid systemic absorption (in case this is contributing to her elevated sugars) - Patient to re-attempt cataract extraction on Monday, 3/28 and will follow up in clinic after that for diabetes re-assessment        Medications Ordered: Meds ordered this encounter  Medications  . insulin  lispro (HUMALOG) 100 UNIT/ML injection    Sig: Inject 0.05 mLs (5 Units total) into the skin 2 (two) times daily before a meal.  Dispense:  10 mL    Refill:  11  . rosuvastatin (CRESTOR) 20 MG tablet    Sig: Take 1 tablet (20 mg total) by mouth at bedtime.    Dispense:  30 tablet    Refill:  11   Medications Stopped: Glipizide 10 mg daily  Other Orders Orders Placed This Encounter  Procedures  . Glucose, capillary

## 2015-03-21 NOTE — Progress Notes (Signed)
Case discussed with Dr. Sherrine Maples at the time of the visit. We reviewed the resident's history and exam and pertinent patient test results. I agree with the assessment, diagnosis, and plan of care documented in the resident's note.

## 2015-03-24 DIAGNOSIS — H2511 Age-related nuclear cataract, right eye: Secondary | ICD-10-CM | POA: Diagnosis not present

## 2015-03-31 ENCOUNTER — Encounter: Payer: Self-pay | Admitting: Internal Medicine

## 2015-03-31 ENCOUNTER — Ambulatory Visit (INDEPENDENT_AMBULATORY_CARE_PROVIDER_SITE_OTHER): Payer: Medicare Other | Admitting: Internal Medicine

## 2015-03-31 VITALS — BP 152/82 | HR 64 | Temp 97.8°F | Ht 67.0 in | Wt 178.0 lb

## 2015-03-31 DIAGNOSIS — E1149 Type 2 diabetes mellitus with other diabetic neurological complication: Secondary | ICD-10-CM

## 2015-03-31 DIAGNOSIS — IMO0002 Reserved for concepts with insufficient information to code with codable children: Secondary | ICD-10-CM

## 2015-03-31 DIAGNOSIS — Z794 Long term (current) use of insulin: Secondary | ICD-10-CM

## 2015-03-31 DIAGNOSIS — E1165 Type 2 diabetes mellitus with hyperglycemia: Secondary | ICD-10-CM

## 2015-03-31 DIAGNOSIS — I10 Essential (primary) hypertension: Secondary | ICD-10-CM

## 2015-03-31 DIAGNOSIS — M1731 Unilateral post-traumatic osteoarthritis, right knee: Secondary | ICD-10-CM

## 2015-03-31 DIAGNOSIS — M79605 Pain in left leg: Principal | ICD-10-CM

## 2015-03-31 DIAGNOSIS — M79604 Pain in right leg: Secondary | ICD-10-CM

## 2015-03-31 DIAGNOSIS — M25561 Pain in right knee: Secondary | ICD-10-CM

## 2015-03-31 MED ORDER — INSULIN LISPRO 100 UNIT/ML (KWIKPEN): 8.0000 [IU] | PEN_INJECTOR | Freq: Three times a day (TID) | SUBCUTANEOUS | Status: DC

## 2015-03-31 MED ORDER — AMLODIPINE BESYLATE 5 MG PO TABS: 5.0000 mg | ORAL_TABLET | Freq: Every day | ORAL | Status: DC

## 2015-03-31 MED ORDER — NAPROXEN 500 MG PO TABS: 500.0000 mg | ORAL_TABLET | Freq: Two times a day (BID) | ORAL | Status: DC

## 2015-03-31 MED ORDER — LOSARTAN POTASSIUM 50 MG PO TABS: 50.0000 mg | ORAL_TABLET | Freq: Two times a day (BID) | ORAL | Status: DC

## 2015-03-31 NOTE — Assessment & Plan Note (Signed)
-   Likely OA after her previous knee fracture. - Given refill for Naproxen.

## 2015-03-31 NOTE — Assessment & Plan Note (Signed)
BP Readings from Last 3 Encounters:  03/31/15 152/82  03/20/15 146/87  02/26/15 143/84    Lab Results  Component Value Date   NA 134* 12/11/2014   K 4.6 12/11/2014   CREATININE 0.88 12/11/2014    Assessment: Blood pressure control: mildly elevated Progress toward BP goal:  deteriorated Comments: suspect some noncompliance with amlodipine.  Plan: Medications:  Increase losartan to 50mg  BID, continue amlodipine 5mg  daily Educational resources provided:   Self management tools provided:   Other plans: follow up in 2-4 weeks.

## 2015-03-31 NOTE — Patient Instructions (Signed)
General Instructions: Start taking Losartan 50mg  twice a day Increase Humalog to 8 units TID with meals (try to erat 3 small meals a day.)  Please bring your medicines with you each time you come to clinic.  Medicines may include prescription medications, over-the-counter medications, herbal remedies, eye drops, vitamins, or other pills.   Progress Toward Treatment Goals:  Treatment Goal 03/31/2015  Hemoglobin A1C unable to assess  Blood pressure deteriorated  Stop smoking smoking less  Prevent falls -    Self Care Goals & Plans:  Self Care Goal 03/31/2015  Manage my medications take my medicines as prescribed; bring my medications to every visit; refill my medications on time  Monitor my health bring my glucose meter and log to each visit; keep track of my blood glucose  Eat healthy foods eat more vegetables; eat baked foods instead of fried foods; eat foods that are low in salt  Be physically active find an activity I enjoy  Stop smoking set a quit date and stop smoking  Prevent falls -  Meeting treatment goals maintain the current self-care plan    Home Blood Glucose Monitoring 03/31/2015  Check my blood sugar 2 times a day  When to check my blood sugar before meals     Care Management & Community Referrals:  Referral 02/28/2014  Referrals made for care management support none needed  Referrals made to community resources -       Naproxen and naproxen sodium oral immediate-release tablets What is this medicine? NAPROXEN (na PROX en) is a non-steroidal anti-inflammatory drug (NSAID). It is used to reduce swelling and to treat pain. This medicine may be used for dental pain, headache, or painful monthly periods. It is also used for painful joint and muscular problems such as arthritis, tendinitis, bursitis, and gout. This medicine may be used for other purposes; ask your health care provider or pharmacist if you have questions. COMMON BRAND NAME(S): Aflaxen, Aleve, Aleve  Arthritis, All Day Relief, Anaprox, Anaprox DS, Naprosyn What should I tell my health care provider before I take this medicine? They need to know if you have any of these conditions: -asthma -cigarette smoker -drink more than 3 alcohol containing drinks a day -heart disease or circulation problems such as heart failure or leg edema (fluid retention) -high blood pressure -kidney disease -liver disease -stomach bleeding or ulcers -an unusual or allergic reaction to naproxen, aspirin, other NSAIDs, other medicines, foods, dyes, or preservatives -pregnant or trying to get pregnant -breast-feeding How should I use this medicine? Take this medicine by mouth with a glass of water. Follow the directions on the prescription label. Take it with food if your stomach gets upset. Try to not lie down for at least 10 minutes after you take it. Take your medicine at regular intervals. Do not take your medicine more often than directed. Long-term, continuous use may increase the risk of heart attack or stroke. A special MedGuide will be given to you by the pharmacist with each prescription and refill. Be sure to read this information carefully each time. Talk to your pediatrician regarding the use of this medicine in children. Special care may be needed. Overdosage: If you think you have taken too much of this medicine contact a poison control center or emergency room at once. NOTE: This medicine is only for you. Do not share this medicine with others. What if I miss a dose? If you miss a dose, take it as soon as you can. If it is almost  time for your next dose, take only that dose. Do not take double or extra doses. What may interact with this medicine? -alcohol -aspirin -cidofovir -diuretics -lithium -methotrexate -other drugs for inflammation like ketorolac or prednisone -pemetrexed -probenecid -warfarin This list may not describe all possible interactions. Give your health care provider a list  of all the medicines, herbs, non-prescription drugs, or dietary supplements you use. Also tell them if you smoke, drink alcohol, or use illegal drugs. Some items may interact with your medicine. What should I watch for while using this medicine? Tell your doctor or health care professional if your pain does not get better. Talk to your doctor before taking another medicine for pain. Do not treat yourself. This medicine does not prevent heart attack or stroke. In fact, this medicine may increase the chance of a heart attack or stroke. The chance may increase with longer use of this medicine and in people who have heart disease. If you take aspirin to prevent heart attack or stroke, talk with your doctor or health care professional. Do not take other medicines that contain aspirin, ibuprofen, or naproxen with this medicine. Side effects such as stomach upset, nausea, or ulcers may be more likely to occur. Many medicines available without a prescription should not be taken with this medicine. This medicine can cause ulcers and bleeding in the stomach and intestines at any time during treatment. Do not smoke cigarettes or drink alcohol. These increase irritation to your stomach and can make it more susceptible to damage from this medicine. Ulcers and bleeding can happen without warning symptoms and can cause death. You may get drowsy or dizzy. Do not drive, use machinery, or do anything that needs mental alertness until you know how this medicine affects you. Do not stand or sit up quickly, especially if you are an older patient. This reduces the risk of dizzy or fainting spells. This medicine can cause you to bleed more easily. Try to avoid damage to your teeth and gums when you brush or floss your teeth. What side effects may I notice from receiving this medicine? Side effects that you should report to your doctor or health care professional as soon as possible: -black or bloody stools, blood in the urine or  vomit -blurred vision -chest pain -difficulty breathing or wheezing -nausea or vomiting -severe stomach pain -skin rash, skin redness, blistering or peeling skin, hives, or itching -slurred speech or weakness on one side of the body -swelling of eyelids, throat, lips -unexplained weight gain or swelling -unusually weak or tired -yellowing of eyes or skin Side effects that usually do not require medical attention (report to your doctor or health care professional if they continue or are bothersome): -constipation -headache -heartburn This list may not describe all possible side effects. Call your doctor for medical advice about side effects. You may report side effects to FDA at 1-800-FDA-1088. Where should I keep my medicine? Keep out of the reach of children. Store at room temperature between 15 and 30 degrees C (59 and 86 degrees F). Keep container tightly closed. Throw away any unused medicine after the expiration date. NOTE: This sheet is a summary. It may not cover all possible information. If you have questions about this medicine, talk to your doctor, pharmacist, or health care provider.  2015, [employer]vier/Gold Standard. (2009-12-15 20:10:16)

## 2015-03-31 NOTE — Assessment & Plan Note (Signed)
Lab Results  Component Value Date   HGBA1C >14.0 03/17/2015   HGBA1C 8.5 11/18/2014   HGBA1C 6.5 08/15/2014     Assessment: Diabetes control: poor control (HgbA1C >9%) Progress toward A1C goal:  unable to assess Comments:   Plan: Medications:  Metformin 1g BID, Continue Lantus 35 u daily, Increase Humalog to 8 u TIDWC. Home glucose monitoring: Frequency: 2 times a day Timing: before meals Instruction/counseling given: reminded to bring blood glucose meter & log to each visit and reminded to bring medications to each visit Educational resources provided:   Self management tools provided:   Other plans: Encouraged 3 small meals.  Asked that she bring meter to next visit for better insulin titration. Will have her follow up in 2-4 weeks.

## 2015-03-31 NOTE — Progress Notes (Signed)
Susan Holmes Subjective:   Patient ID: Susan Holmes female   DOB: September 24, 1956 59 y.o.   MRN: [id number]  HPI: Susan Holmes is a 59 y.o. female with a PMH detailed below who presents for 2 week follow up of her uncontrolled T2DM.  She was seen by her PCP on 3/24 after a cataract surgery had to be canceled because of hyperglycemia.  At that visit her glipizide was stopped, her lantus was lowered to 35 units and 5 units of Humalog were added to breakfast and dinner. She has made these changes but does not bring her glucometer.  She notes that she would like the Humalog in pen form though as that would be easier.  It is difficult to tell how her  Sugar control has been, she tells me that the mornings are "in the 2s" which I clarify is the 200s, and then after meals are "in the 3s" and at night is in the "1s or 2s."  She denies any hypoglycemia.  HTN She reports compliance with her blood pressure medicines although I am somewhat doubtful as he has not had amlodipine refilled for about 3-4 months.  Knee pain: Would like a medication.  Has had some relief with naproxen in the past.  Currently 7/10, worse with walking or standing long periods of time.  No trauma to the area, has been told has OA in the joint in the past. I have reviewed her previous Xrays, she has had xrays of her right knee in 2008 after a fall off truck and had a proximal tibial fracture which involved the articular surface of the medial plateau.      Past Medical History  Diagnosis Date  . Hepatitis B infection pt unsure    resolved  . Diabetes mellitus, type II   . HTN (hypertension)   . Hyperlipidemia   . Congenital blindness     L eye  . Tobacco abuse     .5 PPD smoker  . Anxiety   . Depression   . Arthritis   . Abnormal stress test   . Glaucoma    Current Outpatient Prescriptions  Medication Sig Dispense Refill  . albuterol-ipratropium (COMBIVENT) 18-103 MCG/ACT inhaler Inhale 2  puffs into the lungs every 4 (four) hours as needed for wheezing or shortness of breath.    Marland Kitchen amLODipine (NORVASC) 5 MG tablet Take 1 tablet (5 mg total) by mouth daily. 90 tablet 1  . aspirin 81 MG EC tablet TAKE 1 TABLET BY MOUTH EVERY DAY 30 tablet 0  . brimonidine (ALPHAGAN) 0.2 % ophthalmic solution Place 1 drop into both eyes 2 (two) times daily.    . dorzolamide-timolol (COSOPT) 22.3-6.8 MG/ML ophthalmic solution Place 1 drop into both eyes 2 (two) times daily.    . Insulin Glargine (LANTUS) 100 UNIT/ML Solostar Pen Inject 35 Units into the skin at bedtime. 15 mL 0  . Insulin Pen Needle (VALUMARK PEN NEEDLES) 31G X 8 MM MISC 1 each by Does not apply route daily. 100 each 3  . latanoprost (XALATAN) 0.005 % ophthalmic solution Place 1 drop into both eyes 2 (two) times daily.    Marland Kitchen losartan (COZAAR) 50 MG tablet Take 1 tablet (50 mg total) by mouth 2 (two) times daily. 180 tablet 1  . metFORMIN (GLUCOPHAGE) 1000 MG tablet Take 1068m (1 tablets) 2 times a day. 60 tablet 11  . mirtazapine (REMERON) 15 MG tablet Take 15 mg by mouth at bedtime.     .Marland Kitchen  nicotine polacrilex (NICORETTE) 4 MG gum Take 1 each (4 mg total) by mouth as needed for smoking cessation. 100 tablet 0  . prednisoLONE acetate (PRED FORTE) 1 % ophthalmic suspension Place 1 drop into both eyes 2 (two) times daily.    . rosuvastatin (CRESTOR) 20 MG tablet Take 1 tablet (20 mg total) by mouth at bedtime. 30 tablet 11  . traZODone (DESYREL) 100 MG tablet Take 250 mg by mouth at bedtime.     Marland Kitchen ACCU-CHEK FASTCLIX LANCETS MISC Check blood sugar 3x a day as instructed dx cod 250.00 insulin requiring 102 each 5  . Blood Glucose Monitoring Suppl (ACCU-CHEK AVIVA PLUS) W/DEVICE KIT Use to check blood sugar 3 to 4 times daily. diag code 250.62. Insulin dependent 1 kit 0  . diphenhydrAMINE (BENADRYL) 25 mg capsule Take 1 capsule (25 mg total) by mouth every 8 (eight) hours as needed. (Patient not taking: Reported on 02/01/2015) 24 capsule 2  .  gabapentin (NEURONTIN) 300 MG capsule Take 2 capsules (600 mg total) by mouth 3 (three) times daily. (Patient not taking: Reported on 03/31/2015) 120 capsule 2  . glucose blood (ACCU-CHEK AVIVA PLUS) test strip Use to test blood sugar 3 times daily. diag code E11.41. Insulin dependent 100 each 11  . insulin lispro (HUMALOG KWIKPEN) 100 UNIT/ML KiwkPen Inject 0.08 mLs (8 Units total) into the skin 3 (three) times daily before meals. 15 mL 11  . Multiple Vitamin (MULTIVITAMIN WITH MINERALS) TABS tablet Take 1 tablet by mouth daily.    . naproxen (NAPROSYN) 500 MG tablet Take 1 tablet (500 mg total) by mouth 2 (two) times daily with a meal. 60 tablet 1  . pantoprazole (PROTONIX) 40 MG tablet Take 1 tablet (40 mg total) by mouth daily. (Patient not taking: Reported on 02/26/2015) 30 tablet 0   No current facility-administered medications for this visit.   Family History  Problem Relation Age of Onset  . Heart disease Mother 72  . Hypertension Mother   . Heart disease Father 72  . Aneurysm Brother   . Aneurysm Sister   . Colon cancer Neg Hx   . Esophageal cancer Neg Hx   . Stomach cancer Neg Hx    History   Social History  . Marital Status: Divorced    Spouse Name: N/A  . Number of Children: 1  . Years of Education: 12   Occupational History  .  Unemployed   Social History Main Topics  . Smoking status: Current Every Day Smoker -- 0.30 packs/day for 20 years    Types: Cigarettes  . Smokeless tobacco: Current User    Types: Chew     Comment: Nicorette gum.  Marland Kitchen Alcohol Use: No  . Drug Use: No  . Sexual Activity: Not Currently    Birth Control/ Protection: Surgical   Other Topics Concern  . None   Social History Narrative   Review of Systems: Review of Systems  Constitutional: Negative for fever and chills.  Eyes: Positive for blurred vision (improved).  Respiratory: Negative for cough.   Cardiovascular: Negative for chest pain and leg swelling.  Gastrointestinal: Negative for  heartburn and abdominal pain.  Musculoskeletal: Positive for joint pain (7/10 right knee).  Skin: Positive for itching (spot of left upper back). Negative for rash.  Neurological: Negative for headaches.  Psychiatric/Behavioral: Negative for substance abuse.     Objective:  Physical Exam: Filed Vitals:   03/31/15 0906  BP: 152/82  Pulse: 64  Temp: 97.8 F (36.6 C)  TempSrc: Oral  Height: '5\' 7"'  (1.702 m)  Weight: 178 lb (80.74 kg)  SpO2: 98%   Physical Exam  Constitutional: She is well-developed, well-nourished, and in no distress.  HENT:  Head: Normocephalic and atraumatic.  Cardiovascular: Normal rate, regular rhythm and normal heart sounds.   Pulmonary/Chest: Effort normal and breath sounds normal. She has no wheezes.  Abdominal: Soft. Bowel sounds are normal.  Musculoskeletal: She exhibits no edema.  Right knee full ROM, no effusion, no warmth, no crepitus, some tenderness along medial joint line.  Skin:  Small comodone on left upper flank.  Psychiatric:  Flat affect  Nursing note and vitals reviewed.    Assessment & Plan:  Case discussed with Dr. Daryll Drown  Type II diabetes mellitus with neurological manifestations, uncontrolled Lab Results  Component Value Date   HGBA1C >14.0 03/17/2015   HGBA1C 8.5 11/18/2014   HGBA1C 6.5 08/15/2014     Assessment: Diabetes control: poor control (HgbA1C >9%) Progress toward A1C goal:  unable to assess Comments:   Plan: Medications:  Metformin 1g BID, Continue Lantus 35 u daily, Increase Humalog to 8 u TIDWC. Home glucose monitoring: Frequency: 2 times a day Timing: before meals Instruction/counseling given: reminded to bring blood glucose meter & log to each visit and reminded to bring medications to each visit Educational resources provided:   Self management tools provided:   Other plans: Encouraged 3 small meals.  Asked that she bring meter to next visit for better insulin titration. Will have her follow up in 2-4  weeks.      Osteoarthritis - Likely OA after her previous knee fracture. - Given refill for Naproxen.   Essential hypertension BP Readings from Last 3 Encounters:  03/31/15 152/82  03/20/15 146/87  02/26/15 143/84    Lab Results  Component Value Date   NA 134* 12/11/2014   K 4.6 12/11/2014   CREATININE 0.88 12/11/2014    Assessment: Blood pressure control: mildly elevated Progress toward BP goal:  deteriorated Comments: suspect some noncompliance with amlodipine.  Plan: Medications:  Increase losartan to 44m BID, continue amlodipine 575mdaily Educational resources provided:   Self management tools provided:   Other plans: follow up in 2-4 weeks.      Medications Ordered Meds ordered this encounter  Medications  . naproxen (NAPROSYN) 500 MG tablet    Sig: Take 1 tablet (500 mg total) by mouth 2 (two) times daily with a meal.    Dispense:  60 tablet    Refill:  1  . losartan (COZAAR) 50 MG tablet    Sig: Take 1 tablet (50 mg total) by mouth 2 (two) times daily.    Dispense:  180 tablet    Refill:  1  . amLODipine (NORVASC) 5 MG tablet    Sig: Take 1 tablet (5 mg total) by mouth daily.    Dispense:  90 tablet    Refill:  1  . insulin lispro (HUMALOG KWIKPEN) 100 UNIT/ML KiwkPen    Sig: Inject 0.08 mLs (8 Units total) into the skin 3 (three) times daily before meals.    Dispense:  15 mL    Refill:  11    Please d/c Humalog vials   Other Orders No orders of the defined types were placed in this encounter.

## 2015-04-02 NOTE — Progress Notes (Signed)
[facility] Attending  Case discussed with Dr. Hoffman soon after the resident saw the patient.  We reviewed the resident's history and exam and pertinent patient test results.  I agree with the assessment, diagnosis, and plan of care documented in the resident's note. 

## 2015-04-08 DIAGNOSIS — H2512 Age-related nuclear cataract, left eye: Secondary | ICD-10-CM | POA: Diagnosis not present

## 2015-04-14 ENCOUNTER — Other Ambulatory Visit: Payer: Self-pay | Admitting: Internal Medicine

## 2015-04-14 DIAGNOSIS — H2512 Age-related nuclear cataract, left eye: Secondary | ICD-10-CM | POA: Diagnosis not present

## 2015-04-14 DIAGNOSIS — H269 Unspecified cataract: Secondary | ICD-10-CM | POA: Diagnosis not present

## 2015-04-21 ENCOUNTER — Telehealth: Payer: Self-pay | Admitting: *Deleted

## 2015-04-21 ENCOUNTER — Ambulatory Visit (INDEPENDENT_AMBULATORY_CARE_PROVIDER_SITE_OTHER): Payer: Medicare Other | Admitting: Internal Medicine

## 2015-04-21 ENCOUNTER — Encounter: Payer: Self-pay | Admitting: Internal Medicine

## 2015-04-21 VITALS — BP 144/79 | HR 63 | Temp 97.9°F | Ht 67.0 in | Wt 181.2 lb

## 2015-04-21 DIAGNOSIS — J302 Other seasonal allergic rhinitis: Secondary | ICD-10-CM | POA: Insufficient documentation

## 2015-04-21 DIAGNOSIS — K5900 Constipation, unspecified: Secondary | ICD-10-CM | POA: Diagnosis not present

## 2015-04-21 DIAGNOSIS — M1711 Unilateral primary osteoarthritis, right knee: Secondary | ICD-10-CM | POA: Diagnosis not present

## 2015-04-21 DIAGNOSIS — M1710 Unilateral primary osteoarthritis, unspecified knee: Secondary | ICD-10-CM

## 2015-04-21 DIAGNOSIS — I10 Essential (primary) hypertension: Secondary | ICD-10-CM

## 2015-04-21 MED ORDER — POLYETHYLENE GLYCOL 3350 17 G PO PACK: 17.0000 g | PACK | Freq: Every day | ORAL | Status: DC

## 2015-04-21 MED ORDER — LORATADINE 10 MG PO TABS: 10.0000 mg | ORAL_TABLET | Freq: Every day | ORAL | Status: DC

## 2015-04-21 MED ORDER — SALINE SPRAY 0.65 % NA SOLN: 2.0000 | NASAL | Status: DC | PRN

## 2015-04-21 MED ORDER — DOCUSATE SODIUM 100 MG PO CAPS: 100.0000 mg | ORAL_CAPSULE | Freq: Two times a day (BID) | ORAL | Status: DC

## 2015-04-21 MED ORDER — LIDOCAINE 5 % EX PTCH: 1.0000 | MEDICATED_PATCH | CUTANEOUS | Status: DC

## 2015-04-21 NOTE — Progress Notes (Signed)
   Subjective:    Patient ID: Susan Holmes, female    DOB: 1956/02/17, 59 y.o.   MRN: [id number]  HPI Comments: 59 y.o presents for acute visit   1. C/o constipation x 2 weeks. Normally she has stool 2 x per day. She tried OTC meds stool softner and laxative until Saturday total of 4 x. It is hard for her stool to come out and feels like hard pellets. She has even tried raisins. She c/o 4/10 lower abdomen pain "hurting" w/o radiation   2. She c/o allergies with sx's of throat itching and feeling like sand in her throat, coughing, sneezing.  She has been given Allegra in the past but doesn't know if it helped.  She is not currently taking any medication 3. She c/o right knee pain, chronic. Naprosyn 500 mg bid is not helping and she is requesting a pain patch.   Pain is 10/10.      Review of Systems  HENT: Positive for rhinorrhea and sneezing.   Respiratory: Negative for cough.   Cardiovascular: Negative for chest pain.  Gastrointestinal: Positive for abdominal pain and constipation. Negative for nausea and vomiting.  Musculoskeletal: Positive for arthralgias.       Objective:   Physical Exam  Constitutional: She is oriented to person, place, and time. She appears well-developed and well-nourished. She is cooperative. No distress.  HENT:  Head: Normocephalic and atraumatic.  Mouth/Throat: No oropharyngeal exudate.  Eyes: Conjunctivae are normal. Right eye exhibits no discharge. Left eye exhibits no discharge. No scleral icterus.  Cardiovascular: Normal rate, regular rhythm, S1 normal, S2 normal and normal heart sounds.   No murmur heard. Negative edema   Pulmonary/Chest: Effort normal and breath sounds normal. No respiratory distress. She has no wheezes.  Abdominal: Soft. Bowel sounds are normal. There is no tenderness.  Nl bs   Neurological: She is alert and oriented to person, place, and time. Gait normal.  Skin: Skin is warm, dry and intact. No rash noted. She is not  diaphoretic.  Psychiatric: She has a normal mood and affect. Her speech is normal and behavior is normal. Judgment and thought content normal. Cognition and memory are normal.  Nursing note and vitals reviewed.         Assessment & Plan:  F/u in 1 week prn

## 2015-04-21 NOTE — Assessment & Plan Note (Signed)
Will try Miralax 17 g daily and Colace 100 mg bid. No concern for SBO. Last colonoscopy 2014 with int. hemmrhoids and benign polyp ? If related to trazadone can be constipating but pt needs medication and will not stop RTC in 1 week prn

## 2015-04-21 NOTE — Assessment & Plan Note (Signed)
Will try Claritin, nasal saline  If not improvement can add Flonase

## 2015-04-21 NOTE — Progress Notes (Signed)
Case discussed with Dr. Marigene Ehlers at time of visit. We reviewed the resident's history and exam and pertinent patient test results. I agree with the assessment, diagnosis, and plan of care documented in the resident's note.

## 2015-04-21 NOTE — Telephone Encounter (Signed)
Pt called and states she can not afford Colace, Miralax, and loratadine. She wants something less expensive. Pt # [phone]  I called the pharmacy to ask if they have any suggestions. These drugs are OTC so insurance will not pay.  The only thing he recommends is  Nasal saline spray $3

## 2015-04-21 NOTE — Assessment & Plan Note (Signed)
Continue naprosyn will try Lidoderm patch

## 2015-04-21 NOTE — Patient Instructions (Signed)
General Instructions: Follow in 1 week if needed   Allergies Allergies may happen from anything your body is sensitive to. This may be food, medicines, pollens, chemicals, and nearly anything around you in everyday life that produces allergens. An allergen is anything that causes an allergy producing substance. Heredity is often a factor in causing these problems. This means you may have some of the same allergies as your parents. Food allergies happen in all age groups. Food allergies are some of the most severe and life threatening. Some common food allergies are cow's milk, seafood, eggs, nuts, wheat, and soybeans. SYMPTOMS   Swelling around the mouth.  An itchy red rash or hives.  Vomiting or diarrhea.  Difficulty breathing. SEVERE ALLERGIC REACTIONS ARE LIFE-THREATENING. This reaction is called anaphylaxis. It can cause the mouth and throat to swell and cause difficulty with breathing and swallowing. In severe reactions only a trace amount of food (for example, peanut oil in a salad) may cause death within seconds. Seasonal allergies occur in all age groups. These are seasonal because they usually occur during the same season every year. They may be a reaction to molds, grass pollens, or tree pollens. Other causes of problems are house dust mite allergens, pet dander, and mold spores. The symptoms often consist of nasal congestion, a runny itchy nose associated with sneezing, and tearing itchy eyes. There is often an associated itching of the mouth and ears. The problems happen when you come in contact with pollens and other allergens. Allergens are the particles in the air that the body reacts to with an allergic reaction. This causes you to release allergic antibodies. Through a chain of events, these eventually cause you to release histamine into the blood stream. Although it is meant to be protective to the body, it is this release that causes your discomfort. This is why you were given  anti-histamines to feel better. If you are unable to pinpoint the offending allergen, it may be determined by skin or blood testing. Allergies cannot be cured but can be controlled with medicine. Hay fever is a collection of all or some of the seasonal allergy problems. It may often be treated with simple over-the-counter medicine such as diphenhydramine. Take medicine as directed. Do not drink alcohol or drive while taking this medicine. Check with your caregiver or package insert for child dosages. If these medicines are not effective, there are many new medicines your caregiver can prescribe. Stronger medicine such as nasal spray, eye drops, and corticosteroids may be used if the first things you try do not work well. Other treatments such as immunotherapy or desensitizing injections can be used if all else fails. Follow up with your caregiver if problems continue. These seasonal allergies are usually not life threatening. They are generally more of a nuisance that can often be handled using medicine. HOME CARE INSTRUCTIONS   If unsure what causes a reaction, keep a diary of foods eaten and symptoms that follow. Avoid foods that cause reactions.  If hives or rash are present:  Take medicine as directed.  You may use an over-the-counter antihistamine (diphenhydramine) for hives and itching as needed.  Apply cold compresses (cloths) to the skin or take baths in cool water. Avoid hot baths or showers. Heat will make a rash and itching worse.  If you are severely allergic:  Following a treatment for a severe reaction, hospitalization is often required for closer follow-up.  Wear a medic-alert bracelet or necklace stating the allergy.  You and  your family must learn how to give adrenaline or use an anaphylaxis kit.  If you have had a severe reaction, always carry your anaphylaxis kit or EpiPen with you. Use this medicine as directed by your caregiver if a severe reaction is occurring. Failure  to do so could have a fatal outcome. SEEK MEDICAL CARE IF:  You suspect a food allergy. Symptoms generally happen within 30 minutes of eating a food.  Your symptoms have not gone away within 2 days or are getting worse.  You develop new symptoms.  You want to retest yourself or your child with a food or drink you think causes an allergic reaction. Never do this if an anaphylactic reaction to that food or drink has happened before. Only do this under the care of a caregiver. SEEK IMMEDIATE MEDICAL CARE IF:   You have difficulty breathing, are wheezing, or have a tight feeling in your chest or throat.  You have a swollen mouth, or you have hives, swelling, or itching all over your body.  You have had a severe reaction that has responded to your anaphylaxis kit or an EpiPen. These reactions may return when the medicine has worn off. These reactions should be considered life threatening. MAKE SURE YOU:   Understand these instructions.  Will watch your condition.  Will get help right away if you are not doing well or get worse. Document Released: 03/08/2003 Document Revised: 04/09/2013 Document Reviewed: 08/12/2008 [facility] Patient Information 2015 [place], [place]. This information is not intended to replace advice given to you by your health care provider. Make sure you discuss any questions you have with your health care provider.  Constipation Constipation is when a person has fewer than three bowel movements a week, has difficulty having a bowel movement, or has stools that are dry, hard, or larger than normal. As people grow older, constipation is more common. If you try to fix constipation with medicines that make you have a bowel movement (laxatives), the problem may get worse. Long-term laxative use may cause the muscles of the colon to become weak. A low-fiber diet, not taking in enough fluids, and taking certain medicines may make constipation worse.  CAUSES   Certain medicines,  such as antidepressants, pain medicine, iron supplements, antacids, and water pills.   Certain diseases, such as diabetes, irritable bowel syndrome (IBS), thyroid disease, or depression.   Not drinking enough water.   Not eating enough fiber-rich foods.   Stress or travel.   Lack of physical activity or exercise.   Ignoring the urge to have a bowel movement.   Using laxatives too much.  SIGNS AND SYMPTOMS   Having fewer than three bowel movements a week.   Straining to have a bowel movement.   Having stools that are hard, dry, or larger than normal.   Feeling full or bloated.   Pain in the lower abdomen.   Not feeling relief after having a bowel movement.  DIAGNOSIS  Your health care provider will take a medical history and perform a physical exam. Further testing may be done for severe constipation. Some tests may include:  A barium enema X-ray to examine your rectum, colon, and, sometimes, your small intestine.   A sigmoidoscopy to examine your lower colon.   A colonoscopy to examine your entire colon. TREATMENT  Treatment will depend on the severity of your constipation and what is causing it. Some dietary treatments include drinking more fluids and eating more fiber-rich foods. Lifestyle treatments may include regular  exercise. If these diet and lifestyle recommendations do not help, your health care provider may recommend taking over-the-counter laxative medicines to help you have bowel movements. Prescription medicines may be prescribed if over-the-counter medicines do not work.  HOME CARE INSTRUCTIONS   Eat foods that have a lot of fiber, such as fruits, vegetables, whole grains, and beans.  Limit foods high in fat and processed sugars, such as french fries, hamburgers, cookies, candies, and soda.   A fiber supplement may be added to your diet if you cannot get enough fiber from foods.   Drink enough fluids to keep your urine clear or pale  yellow.   Exercise regularly or as directed by your health care provider.   Go to the restroom when you have the urge to go. Do not hold it.   Only take over-the-counter or prescription medicines as directed by your health care provider. Do not take other medicines for constipation without talking to your health care provider first.  [place] IF:   You have bright red blood in your stool.   Your constipation lasts for more than 4 days or gets worse.   You have abdominal or rectal pain.   You have thin, pencil-like stools.   You have unexplained weight loss. MAKE SURE YOU:   Understand these instructions.  Will watch your condition.  Will get help right away if you are not doing well or get worse. Document Released: 09/10/2004 Document Revised: 12/18/2013 Document Reviewed: 09/24/2013 [facility] Patient Information 2015 [place], [place]. This information is not intended to replace advice given to you by your health care provider. Make sure you discuss any questions you have with your health care provider.    Treatment Goals:  Goals (1 Years of Data) as of 04/21/15          As of Today 03/31/15 03/20/15 03/17/15 02/26/15     Blood Pressure   . Blood Pressure < 140/90  144/79 152/82 146/87  143/84     Diet   . Have 3 meals a day           Lifestyle   . Quit smoking / using tobacco           Result Component   . HEMOGLOBIN A1C < 7.0     >14.0    . LDL CALC < 100     124       Progress Toward Treatment Goals:  Treatment Goal 04/21/2015  Hemoglobin A1C unchanged  Blood pressure improved  Stop smoking -  Prevent falls -    Self Care Goals & Plans:  Self Care Goal 04/21/2015  Manage my medications take my medicines as prescribed; bring my medications to every visit; refill my medications on time; follow the sick day instructions if I am sick  Monitor my health keep track of my blood pressure; keep track of my blood glucose; check my feet daily;  bring my glucose meter and log to each visit; bring my blood pressure log to each visit; keep track of my weight  Eat healthy foods drink diet soda or water instead of juice or soda; eat more vegetables; eat foods that are low in salt; eat baked foods instead of fried foods; eat fruit for snacks and desserts; eat smaller portions  Be physically active find an activity I enjoy  Stop smoking call [facility] (1-800-QUIT-NOW)  Prevent falls -  Meeting treatment goals maintain the current self-care plan    Home Blood Glucose Monitoring  04/21/2015  Check my blood sugar 2 times a day  When to check my blood sugar before meals     Care Management & Community Referrals:  Referral 04/21/2015  Referrals made for care management support none needed  Referrals made to community resources none

## 2015-04-21 NOTE — Assessment & Plan Note (Signed)
BP slightly elevated today 144/79 but improved  Cont Norvasc 5, Cozaar 50 mg bid

## 2015-04-24 ENCOUNTER — Other Ambulatory Visit: Payer: Self-pay | Admitting: Internal Medicine

## 2015-04-28 ENCOUNTER — Encounter: Payer: Self-pay | Admitting: *Deleted

## 2015-05-15 ENCOUNTER — Encounter: Payer: Self-pay | Admitting: Internal Medicine

## 2015-05-15 ENCOUNTER — Ambulatory Visit (INDEPENDENT_AMBULATORY_CARE_PROVIDER_SITE_OTHER): Payer: Medicare Other | Admitting: Internal Medicine

## 2015-05-15 VITALS — BP 134/83 | HR 64 | Temp 98.4°F | Ht 67.0 in | Wt 183.9 lb

## 2015-05-15 DIAGNOSIS — M25552 Pain in left hip: Secondary | ICD-10-CM | POA: Diagnosis not present

## 2015-05-15 DIAGNOSIS — E119 Type 2 diabetes mellitus without complications: Secondary | ICD-10-CM

## 2015-05-15 DIAGNOSIS — M25551 Pain in right hip: Secondary | ICD-10-CM | POA: Diagnosis not present

## 2015-05-15 LAB — GLUCOSE, CAPILLARY: Glucose-Capillary: 171 mg/dL — ABNORMAL HIGH (ref 65–99)

## 2015-05-15 MED ORDER — MELOXICAM 7.5 MG PO TABS: 7.5000 mg | ORAL_TABLET | Freq: Every day | ORAL | Status: DC

## 2015-05-15 NOTE — Progress Notes (Signed)
Subjective:   Patient ID: Susan Holmes female   DOB: June 23, 1956 59 y.o.   MRN: [id number]  HPI: Susan Holmes is a 59 y.o. woman with a history of arthritis, DM2, hypertension, hyperlipidemia, hepatitis B, anxiety and depression who presented to the ED with pain on the outside of her hips that extends down to her knees. The pain is an ache and it started about one week ago. It bothers her to sleep on either side. She has no history of trauma or overuse. She has found that tylenol, advil, motrin and Icy-Hot help briefly.  She has had extensive imaging in the past. Her CT lumbar spine during an episode of lower back pain in 06/2013 showed degenerative changes.   Past Medical History  Diagnosis Date  . Hepatitis B infection pt unsure    resolved  . Diabetes mellitus, type II   . HTN (hypertension)   . Hyperlipidemia   . Congenital blindness     L eye  . Tobacco abuse     .5 PPD smoker  . Anxiety   . Depression   . Arthritis   . Abnormal stress test   . Glaucoma    Current Outpatient Prescriptions  Medication Sig Dispense Refill  . ACCU-CHEK FASTCLIX LANCETS MISC Check blood sugar 3x a day as instructed dx cod 250.00 insulin requiring 102 each 5  . albuterol-ipratropium (COMBIVENT) 18-103 MCG/ACT inhaler Inhale 2 puffs into the lungs every 4 (four) hours as needed for wheezing or shortness of breath.    Marland Kitchen amLODipine (NORVASC) 5 MG tablet Take 1 tablet (5 mg total) by mouth daily. 90 tablet 1  . aspirin 81 MG EC tablet TAKE 1 TABLET BY MOUTH EVERY DAY 30 tablet 0  . Blood Glucose Monitoring Suppl (ACCU-CHEK AVIVA PLUS) W/DEVICE KIT Use to check blood sugar 3 to 4 times daily. diag code 250.62. Insulin dependent 1 kit 0  . brimonidine (ALPHAGAN) 0.2 % ophthalmic solution Place 1 drop into both eyes 2 (two) times daily.    Marland Kitchen docusate sodium (COLACE) 100 MG capsule Take 1 capsule (100 mg total) by mouth 2 (two) times daily. 30 capsule 0  . dorzolamide-timolol (COSOPT) 22.3-6.8  MG/ML ophthalmic solution Place 1 drop into both eyes 2 (two) times daily.    Marland Kitchen gabapentin (NEURONTIN) 300 MG capsule Take 2 capsules (600 mg total) by mouth 3 (three) times daily. (Patient not taking: Reported on 03/31/2015) 120 capsule 2  . glipiZIDE (GLUCOTROL) 10 MG tablet TAKE 1 TABLET BY MOUTH DAILY BEFORE BREAKFAST 30 tablet 3  . glucose blood (ACCU-CHEK AVIVA PLUS) test strip Use to test blood sugar 3 times daily. diag code E11.41. Insulin dependent 100 each 11  . insulin lispro (HUMALOG KWIKPEN) 100 UNIT/ML KiwkPen Inject 0.08 mLs (8 Units total) into the skin 3 (three) times daily before meals. 15 mL 11  . Insulin Pen Needle (VALUMARK PEN NEEDLES) 31G X 8 MM MISC 1 each by Does not apply route daily. 100 each 3  . LANTUS SOLOSTAR 100 UNIT/ML Solostar Pen INJECT 35 UNITS INTO THE SKIN EVERY NIGHT AT BEDTIME 15 mL 0  . latanoprost (XALATAN) 0.005 % ophthalmic solution Place 1 drop into both eyes 2 (two) times daily.    Marland Kitchen lidocaine (LIDODERM) 5 % Place 1 patch onto the skin daily. Remove & Discard patch within 12 hours or as directed by MD 30 patch 0  . loratadine (CLARITIN) 10 MG tablet Take 1 tablet (10 mg total) by mouth daily. [place]  tablet 2  . losartan (COZAAR) 50 MG tablet Take 1 tablet (50 mg total) by mouth 2 (two) times daily. 180 tablet 1  . meloxicam (MOBIC) 7.5 MG tablet Take 1 tablet (7.5 mg total) by mouth daily. 14 tablet 0  . metFORMIN (GLUCOPHAGE) 1000 MG tablet Take 1044m (1 tablets) 2 times a day. 60 tablet 11  . mirtazapine (REMERON) 15 MG tablet Take 15 mg by mouth at bedtime.     . Multiple Vitamin (MULTIVITAMIN WITH MINERALS) TABS tablet Take 1 tablet by mouth daily.    . naproxen (NAPROSYN) 500 MG tablet Take 1 tablet (500 mg total) by mouth 2 (two) times daily with a meal. 60 tablet 1  . nicotine polacrilex (NICORETTE) 4 MG gum Take 1 each (4 mg total) by mouth as needed for smoking cessation. 100 tablet 0  . pantoprazole (PROTONIX) 40 MG tablet Take 1 tablet (40 mg  total) by mouth daily. (Patient not taking: Reported on 04/21/2015) 30 tablet 0  . polyethylene glycol (MIRALAX / GLYCOLAX) packet Take 17 g by mouth daily. 14 each 0  . prednisoLONE acetate (PRED FORTE) 1 % ophthalmic suspension Place 1 drop into both eyes 2 (two) times daily.    . rosuvastatin (CRESTOR) 20 MG tablet Take 1 tablet (20 mg total) by mouth at bedtime. 30 tablet 11  . sodium chloride (OCEAN) 0.65 % SOLN nasal spray Place 2 sprays into both nostrils as needed for congestion. 60 mL 2  . traZODone (DESYREL) 100 MG tablet Take 250 mg by mouth at bedtime.      No current facility-administered medications for this visit.   Family History  Problem Relation Age of Onset  . Heart disease Mother 630 . Hypertension Mother   . Heart disease Father 669 . Aneurysm Brother   . Aneurysm Sister   . Colon cancer Neg Hx   . Esophageal cancer Neg Hx   . Stomach cancer Neg Hx    History   Social History  . Marital Status: Divorced    Spouse Name: N/A  . Number of Children: 1  . Years of Education: 12   Occupational History  .  Unemployed   Social History Main Topics  . Smoking status: Current Every Day Smoker -- 0.30 packs/day for 20 years    Types: Cigarettes  . Smokeless tobacco: Current User    Types: Chew     Comment: Nicorette gum.  .Marland KitchenAlcohol Use: No  . Drug Use: No  . Sexual Activity: Not Currently    Birth Control/ Protection: Surgical   Other Topics Concern  . None   Social History Narrative   Review of Systems: General: no recent illness Skin: no rashes or lesions HEENT: no headache, no vision changes Cardiac: no chest pain, no palpitations Respiratory: no shortness of breath GI: no changes Urinary: no changes Msk: right and left lateral hip pain see HPI, also chronic low back pain Psychiatric: history of depression   Objective:  Physical Exam: Filed Vitals:   05/15/15 1536  BP: 134/83  Pulse: 64  Temp: 98.4 F (36.9 C)  TempSrc: Oral  Height: 5'  7" (1.702 m)  Weight: 183 lb 14.4 oz (83.416 kg)  SpO2: 100%   Appearance: in NAD, pleasant HEENT: AT/[place], PERRL, EOMi Heart: RRR, normal S1S2, no murmurs Lungs: CTAB, no wheezes Abdomen: BS+, soft, nontender Musculoskeletal: pain to palpation on lateral aspect of each hip, pain on passive movement of joint on both sides Extremities: no edema Neurologic:  A&Ox3 Skin: no rashes or lesions  Assessment & Plan:   Please see problem oriented charting for assessment & plan  Venita Lick, MD

## 2015-05-15 NOTE — Patient Instructions (Signed)
Try taking the meloxicam once per day for the next 10 to 14 days to help with your left hip pain.  If this does not help, come back in and we can re-assess. Joint injections from Sports Medicine could help with this pain.  Physical Therapy is another option to consider down the road.   Hip Bursitis Bursitis is a swelling and soreness (inflammation) of a fluid-filled sac (bursa). This sac overlies and protects the joints.  CAUSES   Injury.  Overuse of the muscles surrounding the joint.  Arthritis.  Gout.  Infection.  Cold weather.  Inadequate warm-up and conditioning prior to activities. The cause may not be known.  SYMPTOMS   Mild to severe irritation.  Tenderness and swelling over the outside of the hip.  Pain with motion of the hip.  If the bursa becomes infected, a fever may be present. Redness, tenderness, and warmth will develop over the hip. Symptoms usually lessen in 3 to 4 weeks with treatment, but can come back. TREATMENT If conservative treatment does not work, your caregiver may advise draining the bursa and injecting cortisone into the area. This may speed up the healing process. This may also be used as an initial treatment of choice. HOME CARE INSTRUCTIONS   Apply ice to the affected area for 15-20 minutes every 3 to 4 hours while awake for the first 2 days. Put the ice in a plastic bag and place a towel between the bag of ice and your skin.  Rest the painful joint as much as possible, but continue to put the joint through a normal range of motion at least 4 times per day. When the pain lessens, begin normal, slow movements and usual activities to help prevent stiffness of the hip.  Only take over-the-counter or prescription medicines for pain, discomfort, or fever as directed by your caregiver.  Use crutches to limit weight bearing on the hip joint, if advised.  Elevate your painful hip to reduce swelling. Use pillows for propping and cushioning your legs  and hips.  Gentle massage may provide comfort and decrease swelling. SEEK IMMEDIATE MEDICAL CARE IF:   Your pain increases even during treatment, or you are not improving.  You have a fever.  You have heat and inflammation over the involved bursa.  You have any other questions or concerns. MAKE SURE YOU:   Understand these instructions.  Will watch your condition.  Will get help right away if you are not doing well or get worse. Document Released: 06/04/2002 Document Revised: 03/06/2012 Document Reviewed: 01/01/2009 [facility] Patient Information 2015 [place], [place]. This information is not intended to replace advice given to you by your health care provider. Make sure you discuss any questions you have with your health care provider.   General Instructions:   Please bring your medicines with you each time you come to clinic.  Medicines may include prescription medications, over-the-counter medications, herbal remedies, eye drops, vitamins, or other pills.   Progress Toward Treatment Goals:  Treatment Goal 04/21/2015  Hemoglobin A1C unchanged  Blood pressure improved  Stop smoking -  Prevent falls -    Self Care Goals & Plans:  Self Care Goal 05/15/2015  Manage my medications take my medicines as prescribed; bring my medications to every visit; refill my medications on time  Monitor my health keep track of my blood glucose; bring my glucose meter and log to each visit; keep track of my blood pressure; check my feet daily  Eat healthy foods eat more  vegetables; eat foods that are low in salt; eat baked foods instead of fried foods  Be physically active find an activity I enjoy  Stop smoking cut down the number of cigarettes smoked  Prevent falls -  Meeting treatment goals -    Home Blood Glucose Monitoring 04/21/2015  Check my blood sugar 2 times a day  When to check my blood sugar before meals     Care Management & Community Referrals:  Referral 04/21/2015   Referrals made for care management support none needed  Referrals made to community resources none

## 2015-05-18 DIAGNOSIS — M25552 Pain in left hip: Principal | ICD-10-CM

## 2015-05-18 DIAGNOSIS — M25551 Pain in right hip: Secondary | ICD-10-CM | POA: Insufficient documentation

## 2015-05-18 NOTE — Assessment & Plan Note (Addendum)
Patient has been experiencing bilateral hip pain for the past week. She is unable to sleep on either side secondary to this pain. She had left hip pain in 06/2013 with lumbar CT showing degenerative changes or the left hip at that time. This is likely trochanteric bursitis. It could also be IT band syndrome.  - Meloxicam for 10-14 days - RTC in 10-14 days if no improvement - Can get bilateral hip xr and refer to sports medicine for bursa injections at that time if necessary

## 2015-05-20 NOTE — Progress Notes (Signed)
Internal Medicine Clinic Attending  Case discussed with Dr. Mallory at the time of the visit.  We reviewed the resident's history and exam and pertinent patient test results.  I agree with the assessment, diagnosis, and plan of care documented in the resident's note. 

## 2015-05-27 ENCOUNTER — Other Ambulatory Visit: Payer: Self-pay | Admitting: Internal Medicine

## 2015-06-04 ENCOUNTER — Encounter: Payer: Self-pay | Admitting: Internal Medicine

## 2015-06-04 ENCOUNTER — Ambulatory Visit (HOSPITAL_COMMUNITY)
Admission: RE | Admit: 2015-06-04 | Discharge: 2015-06-04 | Disposition: A | Discharge status: Home / Self Care | Payer: Medicare Other | Source: Ambulatory Visit | Attending: Internal Medicine | Admitting: Internal Medicine

## 2015-06-04 ENCOUNTER — Ambulatory Visit (INDEPENDENT_AMBULATORY_CARE_PROVIDER_SITE_OTHER): Payer: Medicare Other | Admitting: Internal Medicine

## 2015-06-04 VITALS — BP 127/81 | HR 63 | Temp 98.4°F | Wt 184.0 lb

## 2015-06-04 DIAGNOSIS — M1612 Unilateral primary osteoarthritis, left hip: Secondary | ICD-10-CM | POA: Diagnosis not present

## 2015-06-04 DIAGNOSIS — E1165 Type 2 diabetes mellitus with hyperglycemia: Secondary | ICD-10-CM

## 2015-06-04 DIAGNOSIS — K5900 Constipation, unspecified: Secondary | ICD-10-CM | POA: Diagnosis not present

## 2015-06-04 DIAGNOSIS — IMO0002 Reserved for concepts with insufficient information to code with codable children: Secondary | ICD-10-CM

## 2015-06-04 DIAGNOSIS — M25552 Pain in left hip: Secondary | ICD-10-CM

## 2015-06-04 DIAGNOSIS — I10 Essential (primary) hypertension: Secondary | ICD-10-CM | POA: Diagnosis not present

## 2015-06-04 DIAGNOSIS — E1149 Type 2 diabetes mellitus with other diabetic neurological complication: Secondary | ICD-10-CM | POA: Diagnosis not present

## 2015-06-04 DIAGNOSIS — F17210 Nicotine dependence, cigarettes, uncomplicated: Secondary | ICD-10-CM

## 2015-06-04 DIAGNOSIS — Z794 Long term (current) use of insulin: Secondary | ICD-10-CM | POA: Diagnosis not present

## 2015-06-04 LAB — GLUCOSE, CAPILLARY: Glucose-Capillary: 155 mg/dL — ABNORMAL HIGH (ref 65–99)

## 2015-06-04 LAB — POCT GLYCOSYLATED HEMOGLOBIN (HGB A1C): Hemoglobin A1C: 9.8

## 2015-06-04 MED ORDER — MAGNESIUM CITRATE PO SOLN: 1.0000 | Freq: Once | ORAL | Status: DC

## 2015-06-04 NOTE — Assessment & Plan Note (Addendum)
Patient is passing BMs every other day with fullness, nausea and decreased appetite. She eats about one meal per day, does not drink much water and eats mostly grains and meat/fish. Colace has helped the consistency somewhat, but her BMs remain harder than usual. Her suddenly increased A1c at her last check (>14% in 02/2015) may be contributing to a gastroparesis. She may also benefit from improved fiber and water intake along with trying 3 small meals daily instead of one big meal.   - Improve fiber intake (discussed importance of fruits, vegetables, high-fiber grains) - Increase water intake from one small bottle to 4-5 bottles daily - Improve diabetes control; A1c today to monitor progress - Can consider gastric emptying study in the future - Magnesium citrate solution (low cost to her) to be used on occasion to help with transit time - Can consider trial off of trazodone if symptoms continue despite improved diabetes control and dietary changes

## 2015-06-04 NOTE — Assessment & Plan Note (Addendum)
Patient previously had bilateral hip pain worst on the outside that prevented her from sleeping on either side at night due to pain. She was thought to have trochanteric bursitis and completed at 10 day course of meloxicam. This seems to have helped transiently, but her pain returned in the left hip. It is possible that this left hip pain, also characterized by lateral pain and tenderness when she lies on her left side, is recurrent trochanteric bursitis. She also has a history of osteoarthritis in her lumbar spine (with imaging in 2014); perhaps she also has developed osteoarthritis of her left hip.  - XR hips to rule out concurrent hip osteoarthritis and other pathology such as femoroacetabular impingement, osteonecrosis or a femoral neck fracture - Can consider referral to sports medicine for bursa injection if imaging is negative     Addendum: XR hip (results below); will refer to sports medicine. Called patient to inform.  FINDINGS: Pelvic ring is intact. Mild degenerative changes of the pubic symphysis are seen. Mild increased sclerosis is noted about the sacroiliac joints bilaterally. Mild osteophytic changes are noted in the superior acetabulum No acute fracture or dislocation is seen. No soft tissue abnormality is noted.  IMPRESSION: Mild degenerative change without acute abnormality.

## 2015-06-04 NOTE — Patient Instructions (Addendum)
Please try to eat THREE small meals per day. Please also increase your water intake. Foods such as vegetables and fruits that had a lot of fiber will help with your constipation.   Please also try the magnesium citrate laxative; use this only when you really need it.  The constipation may be related to your diabetes; if you work to get better control of your sugars, the nausea and slow transit may actually correct itself.  For your hip pain, we will get an xray.  General Instructions:   Please bring your medicines with you each time you come to clinic.  Medicines may include prescription medications, over-the-counter medications, herbal remedies, eye drops, vitamins, or other pills.   Progress Toward Treatment Goals:  Treatment Goal 04/21/2015  Hemoglobin A1C unchanged  Blood pressure improved  Stop smoking -  Prevent falls -    Self Care Goals & Plans:  Self Care Goal 06/04/2015  Manage my medications bring my medications to every visit; refill my medications on time  Monitor my health bring my glucose meter and log to each visit; check my feet daily  Eat healthy foods eat foods that are low in salt; eat baked foods instead of fried foods  Be physically active -  Stop smoking go to the [employer] ([email or web address]); cut down the number of cigarettes smoked  Prevent falls -  Meeting treatment goals -    Home Blood Glucose Monitoring 04/21/2015  Check my blood sugar 2 times a day  When to check my blood sugar before meals     Care Management & Community Referrals:  Referral 04/21/2015  Referrals made for care management support none needed  Referrals made to community resources none

## 2015-06-04 NOTE — Assessment & Plan Note (Signed)
BP Readings from Last 3 Encounters:  06/04/15 127/81  05/15/15 134/83  04/21/15 144/79    Lab Results  Component Value Date   NA 134* 12/11/2014   K 4.6 12/11/2014   CREATININE 0.88 12/11/2014    Assessment: Blood pressure control: good   Progress toward BP goal: at goal    Plan: Medications:  continue current medications; amlodipine 5 mg daily and losartan 50 mg BID Educational resources provided: brochure

## 2015-06-04 NOTE — Progress Notes (Signed)
[facility] Subjective:   Patient ID: Susan Holmes female   DOB: 1956/12/27 59 y.o.   MRN: [id number]  HPI: Susan Holmes is a 59 y.o. female with a history of arthritis, DM2, hypertension, hyperlipidemia, anxiety and depression who presents for evaluation of recent constiptation. She notes that while she used to have bowel movements daily, she now has them every other day and finds that her stool is harder. She has accompanying feeling of fullness and occasional nausea. She has not vomited. She has found that her appetite is somewhat decreased. For example, she tends to just eat one meal today, which is usually a chicken salad sandwich.  In April, she presented for similar complaints. She was given a prescription for Miralax 17 g and Colace 100 mg BID, but was only able to fill the Colace (due to cost); she continues to take the Colace. Her last colonoscopy was in 2014, significant for internal hemorrhoids and a benign polyp. The patient is taking trazodone, which can be constipating. Of note, her most recent diabetes control has been poor, with a 03/17/15 A1c resulting at >14%.   The patient's left hip pain is worst on the outside of her left hip. She cannot lie on that side in bed because of the pain. The pain does not radiate. In May, she presented with similar hip pain bilaterally and completed at 10 day course of Meloxicam. The pain decreased initially, but then returned in the left hip only. Her last CT abdomen pelvis 10/2103 showed lumbar spine degenerative changes.    Past Medical History  Diagnosis Date  . Hepatitis B infection pt unsure    resolved  . Diabetes mellitus, type II   . HTN (hypertension)   . Hyperlipidemia   . Congenital blindness     L eye  . Tobacco abuse     .5 PPD smoker  . Anxiety   . Depression   . Arthritis   . Abnormal stress test   . Glaucoma    Current Outpatient Prescriptions  Medication Sig Dispense Refill  . ACCU-CHEK  FASTCLIX LANCETS MISC Check blood sugar 3x a day as instructed dx cod 250.00 insulin requiring 102 each 5  . albuterol-ipratropium (COMBIVENT) 18-103 MCG/ACT inhaler Inhale 2 puffs into the lungs every 4 (four) hours as needed for wheezing or shortness of breath.    Marland Kitchen amLODipine (NORVASC) 5 MG tablet Take 1 tablet (5 mg total) by mouth daily. 90 tablet 1  . aspirin 81 MG EC tablet TAKE 1 TABLET BY MOUTH EVERY DAY 30 tablet 0  . Blood Glucose Monitoring Suppl (ACCU-CHEK AVIVA PLUS) W/DEVICE KIT Use to check blood sugar 3 to 4 times daily. diag code 250.62. Insulin dependent 1 kit 0  . brimonidine (ALPHAGAN) 0.2 % ophthalmic solution Place 1 drop into both eyes 2 (two) times daily.    Marland Kitchen docusate sodium (COLACE) 100 MG capsule Take 1 capsule (100 mg total) by mouth 2 (two) times daily. 30 capsule 0  . dorzolamide-timolol (COSOPT) 22.3-6.8 MG/ML ophthalmic solution Place 1 drop into both eyes 2 (two) times daily.    Marland Kitchen gabapentin (NEURONTIN) 300 MG capsule Take 2 capsules (600 mg total) by mouth 3 (three) times daily. (Patient not taking: Reported on 03/31/2015) 120 capsule 2  . glipiZIDE (GLUCOTROL) 10 MG tablet TAKE 1 TABLET BY MOUTH DAILY BEFORE BREAKFAST 30 tablet 3  . glucose blood (ACCU-CHEK AVIVA PLUS) test strip Use to test blood sugar 3 times daily. diag code  E11.41. Insulin dependent 100 each 11  . insulin lispro (HUMALOG KWIKPEN) 100 UNIT/ML KiwkPen Inject 0.08 mLs (8 Units total) into the skin 3 (three) times daily before meals. 15 mL 11  . Insulin Pen Needle (VALUMARK PEN NEEDLES) 31G X 8 MM MISC 1 each by Does not apply route daily. 100 each 3  . LANTUS SOLOSTAR 100 UNIT/ML Solostar Pen INJECT 35 UNITS INTO THE SKIN EVERY NIGHT AT BEDTIME 15 mL 0  . latanoprost (XALATAN) 0.005 % ophthalmic solution Place 1 drop into both eyes 2 (two) times daily.    Marland Kitchen lidocaine (LIDODERM) 5 % Place 1 patch onto the skin daily. Remove & Discard patch within 12 hours or as directed by MD 30 patch 0  . loratadine  (CLARITIN) 10 MG tablet Take 1 tablet (10 mg total) by mouth daily. 30 tablet 2  . losartan (COZAAR) 50 MG tablet Take 1 tablet (50 mg total) by mouth 2 (two) times daily. 180 tablet 1  . magnesium citrate SOLN Take 296 mLs (1 Bottle total) by mouth once. 195 mL 0  . meloxicam (MOBIC) 7.5 MG tablet Take 1 tablet (7.5 mg total) by mouth daily. 14 tablet 0  . metFORMIN (GLUCOPHAGE) 1000 MG tablet Take 1033m (1 tablets) 2 times a day. 60 tablet 11  . mirtazapine (REMERON) 15 MG tablet Take 15 mg by mouth at bedtime.     . Multiple Vitamin (MULTIVITAMIN WITH MINERALS) TABS tablet Take 1 tablet by mouth daily.    . naproxen (NAPROSYN) 500 MG tablet Take 1 tablet (500 mg total) by mouth 2 (two) times daily with a meal. 60 tablet 1  . nicotine polacrilex (NICORETTE) 4 MG gum Take 1 each (4 mg total) by mouth as needed for smoking cessation. 100 tablet 0  . pantoprazole (PROTONIX) 40 MG tablet Take 1 tablet (40 mg total) by mouth daily. (Patient not taking: Reported on 04/21/2015) 30 tablet 0  . polyethylene glycol (MIRALAX / GLYCOLAX) packet Take 17 g by mouth daily. 14 each 0  . prednisoLONE acetate (PRED FORTE) 1 % ophthalmic suspension Place 1 drop into both eyes 2 (two) times daily.    . rosuvastatin (CRESTOR) 20 MG tablet Take 1 tablet (20 mg total) by mouth at bedtime. 30 tablet 11  . sodium chloride (OCEAN) 0.65 % SOLN nasal spray Place 2 sprays into both nostrils as needed for congestion. 60 mL 2  . traZODone (DESYREL) 100 MG tablet Take 250 mg by mouth at bedtime.      No current facility-administered medications for this visit.   Family History  Problem Relation Age of Onset  . Heart disease Mother 629 . Hypertension Mother   . Heart disease Father 643 . Aneurysm Brother   . Aneurysm Sister   . Colon cancer Neg Hx   . Esophageal cancer Neg Hx   . Stomach cancer Neg Hx    History   Social History  . Marital Status: Divorced    Spouse Name: N/A  . Number of Children: 1  . Years of  Education: 12   Occupational History  .  Unemployed   Social History Main Topics  . Smoking status: Current Every Day Smoker -- 0.40 packs/day for 20 years    Types: Cigarettes  . Smokeless tobacco: Current User    Types: Chew     Comment: Nicorette gum.  .Marland KitchenAlcohol Use: No  . Drug Use: No  . Sexual Activity: Not Currently    Birth Control/ Protection:  Surgical   Other Topics Concern  . None   Social History Narrative   Review of Systems: General: no recent illness, no sick contacts Skin: no rashes or lesions HEENT: no headaches or blurred vision Cardiac: no chest pain or palpitations Respiratory: no shortness of breath GI: constipation, occasional nausea, fullness, decreased appetite Urinary: no changes Msk: left hip pain, otherwise no pain or swelling Psychiatric: history of anxiety and depression  Objective:  Physical Exam: Filed Vitals:   06/04/15 0909  BP: 127/81  Pulse: 63  Temp: 98.4 F (36.9 C)  TempSrc: Oral  Weight: 184 lb (83.462 kg)  SpO2: 100%   Appearance: in NAD HEENT: AT/[place], PERRL, EOMi Heart: RRR, normal S1S2 Lungs: CTAB, normal work of breathing Abdomen: BS present, soft, nontender to palpation diffusely Musculoskeletal: normal range of motion, tender to palpation of outer hip, pain was elicited on resisted abduction of the left hip Extremities: no edema Neurologic: A&Ox3, strength of lower extremities 5/5 and sensation full Skin: no rashes or lesions  Assessment & Plan:  Case discussed with Dr. Ellwood Dense  Essential hypertension BP Readings from Last 3 Encounters:  06/04/15 127/81  05/15/15 134/83  04/21/15 144/79    Lab Results  Component Value Date   NA 134* 12/11/2014   K 4.6 12/11/2014   CREATININE 0.88 12/11/2014    Assessment: Blood pressure control: good   Progress toward BP goal: at goal    Plan: Medications:  continue current medications; amlodipine 5 mg daily and losartan 50 mg BID Educational resources provided:  brochure    Constipation Patient is passing BMs every other day with fullness, nausea and decreased appetite. She eats about one meal per day, does not drink much water and eats mostly grains and meat/fish. Colace has helped the consistency somewhat, but her BMs remain harder than usual. Her suddenly increased A1c at her last check (>14% in 02/2015) may be contributing to a gastroparesis. She may also benefit from improved fiber and water intake along with trying 3 small meals daily instead of one big meal.   - Improve fiber intake (discussed importance of fruits, vegetables, high-fiber grains) - Increase water intake from one small bottle to 4-5 bottles daily - Improve diabetes control; A1c today to monitor progress - Can consider gastric emptying study in the future - Magnesium citrate solution (low cost to her) to be used on occasion to help with transit time - Can consider trial off of trazodone if symptoms continue despite improved diabetes control and dietary changes   Type II diabetes mellitus with neurological manifestations, uncontrolled Lab Results  Component Value Date   HGBA1C 9.8 06/04/2015   HGBA1C >14.0 03/17/2015   HGBA1C 8.5 11/18/2014     Assessment: Diabetes control: improved from last visit, but poor   Progress toward A1C goal: above goal   Comments: Patient reports compliance with her lantus 35 U at bedtime; she takes he humalog about 3 times per week, only when she eats breakfast or lunch  Plan: Medications:  continue current medications ; 35 U lantus before bedtime, 8 U humalog TID WC and metformin 1000 mg BID Home glucose monitoring: Frequency: 2 times a day   Timing: before meals   Instruction/counseling given: reminded to bring blood glucose meter & log to each visit Educational resources provided: brochure Self management tools provided: copy of home glucose meter download - Encouraged her to eat 3 small meals daily and to take her humalog with each of  these meals - Encouraged low-carbohydrate eating  and discussed dietary choices and likely connection to her constipation     Left hip pain Patient previously had bilateral hip pain worst on the outside that prevented her from sleeping on either side at night due to pain. She was thought to have trochanteric bursitis and completed at 10 day course of meloxicam. This seems to have helped transiently, but her pain returned in the left hip. It is possible that this left hip pain, also characterized by lateral pain and tenderness when she lies on her left side, is recurrent trochanteric bursitis. She also has a history of osteoarthritis in her lumbar spine (with imaging in 2014); perhaps she also has developed osteoarthritis of her left hip.  - XR hips to rule out concurrent hip osteoarthritis and other pathology such as femoroacetabular impingement, osteonecrosis or a femoral neck fracture - Can consider referral to sports medicine for bursa injection if imaging is negative    Medications Ordered Meds ordered this encounter  Medications  . magnesium citrate SOLN    Sig: Take 296 mLs (1 Bottle total) by mouth once.    Dispense:  195 mL    Refill:  0   Other Orders Orders Placed This Encounter  Procedures  . DG HIPS BILAT WITH PELVIS 2V    Standing Status: Future     Number of Occurrences: 1     Standing Expiration Date: 08/03/2016    Order Specific Question:  Reason for Exam (SYMPTOM  OR DIAGNOSIS REQUIRED)    Answer:  left hip pain; likely trochanteric bursitis    Order Specific Question:  Is the patient pregnant?    Answer:  No    Order Specific Question:  Preferred imaging location?    Answer:  [facility]  . Glucose, capillary  . POC Hbg A1C

## 2015-06-04 NOTE — Assessment & Plan Note (Signed)
Lab Results  Component Value Date   HGBA1C 9.8 06/04/2015   HGBA1C >14.0 03/17/2015   HGBA1C 8.5 11/18/2014     Assessment: Diabetes control: improved from last visit, but poor   Progress toward A1C goal: above goal   Comments: Patient reports compliance with her lantus 35 U at bedtime; she takes he humalog about 3 times per week, only when she eats breakfast or lunch  Plan: Medications:  continue current medications ; 35 U lantus before bedtime, 8 U humalog TID WC and metformin 1000 mg BID Home glucose monitoring: Frequency: 2 times a day   Timing: before meals   Instruction/counseling given: reminded to bring blood glucose meter & log to each visit Educational resources provided: brochure Self management tools provided: copy of home glucose meter download - Encouraged her to eat 3 small meals daily and to take her humalog with each of these meals - Encouraged low-carbohydrate eating and discussed dietary choices and likely connection to her constipation

## 2015-06-04 NOTE — Progress Notes (Signed)
Internal Medicine Clinic Attending  Case discussed with Dr. Mallory at the time of the visit.  We reviewed the resident's history and exam and pertinent patient test results.  I agree with the assessment, diagnosis, and plan of care documented in the resident's note. 

## 2015-06-05 NOTE — Addendum Note (Signed)
Addended by: Drucilla Schmidt E on: 06/05/2015 01:05 PM   Modules accepted: Orders

## 2015-06-13 ENCOUNTER — Ambulatory Visit (INDEPENDENT_AMBULATORY_CARE_PROVIDER_SITE_OTHER): Payer: Medicare Other | Admitting: Family Medicine

## 2015-06-13 ENCOUNTER — Encounter: Payer: Self-pay | Admitting: Family Medicine

## 2015-06-13 VITALS — BP 125/82 | Ht 67.0 in | Wt 175.0 lb

## 2015-06-13 DIAGNOSIS — M25552 Pain in left hip: Secondary | ICD-10-CM | POA: Diagnosis not present

## 2015-06-13 MED ORDER — METHYLPREDNISOLONE ACETATE 80 MG/ML IJ SUSP
80.0000 mg | Freq: Once | INTRAMUSCULAR | Status: AC
  Administered 2015-06-13: 80 mg via INTRA_ARTICULAR

## 2015-06-14 NOTE — Assessment & Plan Note (Signed)
Long discussion re options. We did CSI using US guidance today and she will f/u with me in about three weeks to assess effectivenes.

## 2015-06-14 NOTE — Progress Notes (Signed)
Patient ID: Susan Holmes, female   DOB: 01-09-1956, 59 y.o.   MRN: [id number]  Susan Holmes - 59 y.o. female MRN [id number]  Date of birth: 1956/10/31    SUBJECTIVE:     Bilateral but R>L hip pain. Present for last year but  Much worse in last 2-3 weeks.No known inciting event. Pain with trying to tie her shoes, climbing steps, walking and standing. OTC pain relief not working. X rays done last week. She wonders if we could try NTG patch on her hip (like we did her shoulder a few y ago). ROS:     Pertinent review of systems: negative for fever or unusual weight change. She has hx of pain in left shoulder previously (we treated w NTG patch and HEP several years ago and it improved but never totally resolved).   PERTINENT  PMH / PSH FH / / SH:  Past Medical, Surgical, Social, and Family History Reviewed & Updated in the EMR.  Pertinent findings include:  DM Hyperparathyroidism Smoker Blind L eye Anxiety / depression   OBJECTIVE: BP 125/82 mmHg  Ht 5\' 7"  (1.702 m)  Wt 175 lb (79.379 kg)  BMI 27.40 kg/m2  Physical Exam:  Vital signs are reviewed. WD WN NAD HIPS. Both hips decreased IR but much more so on right and she has a lot of pain with movement IR.  MSK: Quad muscle bulk is symmetrical. GAIT: stooped posture, wider based antalgic.  INJECTION:US guided RIGHT hip: PERFORMED BY DR. Kandace Parkins WITH MY ASSITANCE. Patient was given informed consent, signed copy in the chart. Appropriate time out was taken. Area prepped and draped in usual sterile fashion. [place] used to locate anterior right hip joint where a small effusion was noted. @ cc of lidocaine withoyt epinephrine were used as local anesthesia. Using a 20 gauge spinal needle, we injected 1  cc of methylprednisolone 40 mg/ml plus  4 cc of 1% lidocaine without epinephrine was injected into the RIGHT HIP JOINT using a(n) US guided approach. The patient tolerated the procedure well. There were no complications. Post procedure  instructions were given. The patient noted some immediate pain relief from the lidocaine portion of the injectio indicating appropriate placement of the needle into the joint.   ASSESSMENT & PLAN:  See problem based charting & AVS for pt instructions.

## 2015-07-01 ENCOUNTER — Other Ambulatory Visit: Payer: Self-pay | Admitting: Internal Medicine

## 2015-07-01 NOTE — Telephone Encounter (Signed)
Sep or Oct appt with PCP pls. 

## 2015-07-04 ENCOUNTER — Ambulatory Visit (INDEPENDENT_AMBULATORY_CARE_PROVIDER_SITE_OTHER): Payer: Medicare Other | Admitting: Family Medicine

## 2015-07-04 ENCOUNTER — Encounter: Payer: Self-pay | Admitting: Family Medicine

## 2015-07-04 VITALS — BP 132/78 | Ht 67.0 in | Wt 176.0 lb

## 2015-07-04 DIAGNOSIS — M7061 Trochanteric bursitis, right hip: Secondary | ICD-10-CM | POA: Insufficient documentation

## 2015-07-04 DIAGNOSIS — M25551 Pain in right hip: Secondary | ICD-10-CM | POA: Diagnosis not present

## 2015-07-04 MED ORDER — TRAMADOL HCL 50 MG PO TABS: 50.0000 mg | ORAL_TABLET | Freq: Four times a day (QID) | ORAL | Status: DC | PRN

## 2015-07-04 NOTE — Progress Notes (Signed)
Patient ID: Susan Holmes, female   DOB: 12-Sep-1956, 59 y.o.   MRN: [id number] [facility]: Attending Note: I have reviewed the chart, discussed wit the Sports Medicine Fellow. I agree with assessment and treatment plan as detailed in the [place] note.  Will try scheduled tylenol (3 g a day) with small amount tramadol for breakthrough. F/u 5 weeks. If not having enough improvement to get her desired ADL done, then I think we need MRI to further categorize why her hip is so painful and why CSI intraarticular was not more beneficial. She agrees with plan. I discussed in great detail spendiong > 50% 25 min ov in counseling and education re treatment plan and options including medication regimen.

## 2015-07-04 NOTE — Assessment & Plan Note (Signed)
>>  ASSESSMENT AND PLAN FOR RIGHT HIP PAIN WRITTEN ON 07/04/2015 12:29 PM BY HESS, BRYAN R, DO  Injected into the hip joint at last visit with minimal relief.  Could be underlying mild OA causing her pain vs underlying labral vs FAI issue vs AVN femoral head.  Her testing is more c/w OA however w/ provocative testing positive - Continue Ibuprofen 800 mg 3-4 daily along with starting 650 mg tylenol PRN for pain.  Consider Tramadol for breakthrough pain  - If no improvement after 6-8 weeks, consider MRI to evaluate for further etiologies.

## 2015-07-04 NOTE — Patient Instructions (Signed)
Tylenol 650 mg every 6 hours and Tramadol PRN for pain.  Please follow up with [place] in 4-6 weeks.   Thanks

## 2015-07-04 NOTE — Progress Notes (Signed)
Patient ID: Susan Holmes, female   DOB: 1956-02-11, 59 y.o.   MRN: [id number]  Susan Holmes - 59 y.o. female MRN [id number]  Date of birth: 04/26/1956    SUBJECTIVE:     Visit from 06/14/15.  Bilateral but R>L hip pain. Present for last year but worse previous 2-3 weeks.No known inciting event. Pain with trying to tie her shoes, climbing steps, walking and standing. OTC pain relief not working. X rays done from June 2016 showing mild joint space narrowing.   Today, pt states the injection she had done from 6/18 visit lasted for about 3-4 days without much improvement.  Denies new Sx including paresthesias or worsening pain.  She continues with her Ibuprofen PRN with some Sx relief.  Pain is worse at end of day and no specific movement that exacerbates it.    ROS:     Pertinent review of systems: negative for fever or unusual weight change.   PERTINENT  PMH / PSH FH / / SH:  Past Medical, Surgical, Social, and Family History Reviewed & Updated in the EMR.  Pertinent findings include:  DM Hyperparathyroidism Smoker Blind L eye Anxiety / depression   OBJECTIVE: BP 132/78 mmHg  Ht 5\' 7"  (1.702 m)  Wt 176 lb (79.833 kg)  BMI 27.56 kg/m2  Physical Exam:  Vital signs are reviewed. WD WN NAD HIPS. Both hips decreased IR but much more so on right and she has a lot of pain with movement IR.  + FABER on R to groin.  SI joint testing and FADIR negative  MSK: Quad muscle bulk is symmetrical. GAIT: stooped posture, wider based antalgic.  ASSESSMENT & PLAN:  See problem based charting & AVS for pt instructions.

## 2015-07-04 NOTE — Assessment & Plan Note (Addendum)
Injected into the hip joint at last visit with minimal relief.  Could be underlying mild OA causing her pain vs underlying labral vs FAI issue vs AVN femoral head.  Her testing is more c/w OA however w/ provocative testing positive - Continue Ibuprofen 800 mg 3-4 daily along with starting 650 mg tylenol PRN for pain.  Consider Tramadol for breakthrough pain  - If no improvement after 6-8 weeks, consider MRI to evaluate for further etiologies.

## 2015-07-15 DIAGNOSIS — H20023 Recurrent acute iridocyclitis, bilateral: Secondary | ICD-10-CM | POA: Diagnosis not present

## 2015-07-15 DIAGNOSIS — Z961 Presence of intraocular lens: Secondary | ICD-10-CM | POA: Diagnosis not present

## 2015-08-05 ENCOUNTER — Encounter: Payer: Self-pay | Admitting: Internal Medicine

## 2015-08-05 ENCOUNTER — Ambulatory Visit (INDEPENDENT_AMBULATORY_CARE_PROVIDER_SITE_OTHER): Payer: Medicare Other | Admitting: Internal Medicine

## 2015-08-05 VITALS — BP 141/69 | HR 69 | Temp 98.2°F | Ht 67.0 in | Wt 182.8 lb

## 2015-08-05 DIAGNOSIS — M545 Low back pain, unspecified: Secondary | ICD-10-CM

## 2015-08-05 DIAGNOSIS — Z9189 Other specified personal risk factors, not elsewhere classified: Secondary | ICD-10-CM

## 2015-08-05 DIAGNOSIS — Z79899 Other long term (current) drug therapy: Secondary | ICD-10-CM | POA: Diagnosis not present

## 2015-08-05 DIAGNOSIS — M25552 Pain in left hip: Secondary | ICD-10-CM

## 2015-08-05 MED ORDER — DULOXETINE HCL 30 MG PO CPEP: 30.0000 mg | ORAL_CAPSULE | Freq: Every day | ORAL | Status: DC

## 2015-08-05 NOTE — Patient Instructions (Signed)
Ms. Christiansen it was nice meeting you today. I have prescribed Cymbalta 30 mg 1 tablet by mouth everyday. Please take this medication for 2 weeks and then come back for a follow up visit.

## 2015-08-06 DIAGNOSIS — Z961 Presence of intraocular lens: Secondary | ICD-10-CM | POA: Diagnosis not present

## 2015-08-06 DIAGNOSIS — H20023 Recurrent acute iridocyclitis, bilateral: Secondary | ICD-10-CM | POA: Diagnosis not present

## 2015-08-06 DIAGNOSIS — Z9189 Other specified personal risk factors, not elsewhere classified: Secondary | ICD-10-CM | POA: Insufficient documentation

## 2015-08-06 DIAGNOSIS — M545 Low back pain, unspecified: Secondary | ICD-10-CM | POA: Insufficient documentation

## 2015-08-06 NOTE — Assessment & Plan Note (Signed)
Patient has a history of chronic lower back pain. Complaining of increasing pain on the right side of her lower back for the past 1 week. No focal neurological symptoms or sciatica. Tenderness of right lower back paraspinal muscles on palpation. Pain most likely musculoskeletal in nature. Pt states she has been prescribed Gabapentin and Meloxicam in the past but stopped taking them because they were not working. -Cont current management with Ibuprofen, Tylenol, and Tramadol  -Trial of Cymbalta 30 mg 1 tablet QD for 2 weeks -F/u appt in 2 weeks to reassess her pain -F/u with sports medicine for continued management of chronic pain

## 2015-08-06 NOTE — Assessment & Plan Note (Signed)
Patient is on multiple medications for her chronic health issues. She has been referred to Dr. [name] for medication reconciliation.

## 2015-08-06 NOTE — Assessment & Plan Note (Addendum)
Note from sports medicine states patient has bilateral hip pain, R>L. She was given a corticosteroid injection by them in the R hip. During history taking today, patient initially stated she had pain in her R hip but then later stated it was the L hip. Physical examination revealed pain upon internal rotation of the L hip but no pain elicited on the R. B/l hip and pelvis x-ray from 05/2015 shows mild degenerative change without acute abnormality. Pain most likely 2/2 OA. Patient also has intermittent pain, stiffness, and swelling of her DIP joint in index and middle fingers bilaterally, which again is consistent with OA.  -Refer to note on lower back pain.

## 2015-08-06 NOTE — Progress Notes (Signed)
Patient ID: Susan Holmes, female   DOB: 10/30/1956, 59 y.o.   MRN: [id number]   Subjective:   Patient ID: Susan Holmes female   DOB: 07/31/1956 59 y.o.   MRN: [id number]  HPI: Susan Holmes is a 59 y.o. f with a PMHx of DMII, HTN, HLD, anxiety, and depression presenting today with right sided lower back pain. States she has a history of chronic lower back and b/l hip pain for which she follows up with sports medicine. Patient states has been having increasing pain on the right side of her lower back for the past 1 week. States the pain is 10/10 intensity, constant, and made worse with activity and getting up from her bed. However, she does report walking 1 mile last week without any difficulty. Denies any recent falls or injuries. Denies picking up any heavy objects. As per patient, the pain is not a/w any focal weakness, tingling, numbness, or paralysis and does not radiate down her legs. Denies any bowel or bladder incontinence. Patient is currently following up with sports medicine for her chronic back/ hip pain and has been prescribed Ibuprofen 800 mg 3-4 times per day, Tylenol 650 mg prn, and Tramadol 50 mg q6 for breakthrough pain. In addition, her R hip was injected with corticosteroids on 06/13/15. Patient states that none of these treatments or medications have fully alleviated her pain. She is also complaining of intermittent pain, stiffness, and swelling of her DIP joint in index and middle fingers bilaterally.    Past Medical History  Diagnosis Date  . Hepatitis B infection pt unsure    resolved  . Diabetes mellitus, type II   . HTN (hypertension)   . Hyperlipidemia   . Congenital blindness     L eye  . Tobacco abuse     .5 PPD smoker  . Anxiety   . Depression   . Arthritis   . Abnormal stress test   . Glaucoma    Current Outpatient Prescriptions  Medication Sig Dispense Refill  . ACCU-CHEK FASTCLIX LANCETS MISC Check blood sugar 3x a day as instructed dx cod 250.00  insulin requiring 102 each 5  . acetaZOLAMIDE (DIAMOX) 500 MG capsule     . albuterol-ipratropium (COMBIVENT) 18-103 MCG/ACT inhaler Inhale 2 puffs into the lungs every 4 (four) hours as needed for wheezing or shortness of breath.    . Alcohol Swabs (ALCOHOL PREP) 70 % PADS     . amLODipine (NORVASC) 5 MG tablet Take 1 tablet (5 mg total) by mouth daily. 90 tablet 1  . aspirin 81 MG EC tablet TAKE 1 TABLET BY MOUTH EVERY DAY 30 tablet 0  . Blood Glucose Calibration (ACCU-CHEK SMARTVIEW CONTROL) LIQD     . Blood Glucose Monitoring Suppl (ACCU-CHEK AVIVA PLUS) W/DEVICE KIT Use to check blood sugar 3 to 4 times daily. diag code 250.62. Insulin dependent 1 kit 0  . clonazePAM (KLONOPIN) 0.5 MG tablet     . cromolyn (OPTICROM) 4 % ophthalmic solution INT 1 GTT INTO OU QID  0  . dorzolamide-timolol (COSOPT) 22.3-6.8 MG/ML ophthalmic solution Place 1 drop into both eyes 2 (two) times daily.    . fluorometholone (FML) 0.1 % ophthalmic suspension     . glipiZIDE (GLUCOTROL) 10 MG tablet TAKE 1 TABLET BY MOUTH DAILY BEFORE BREAKFAST 30 tablet 3  . insulin lispro (HUMALOG KWIKPEN) 100 UNIT/ML KiwkPen Inject 0.08 mLs (8 Units total) into the skin 3 (three) times daily before meals. 15 mL 11  .   LANTUS SOLOSTAR 100 UNIT/ML Solostar Pen INJECT 35 UNITS SUBCUTANEOUSLY EVERY NIGHT AT BEDTIME 15 mL 5  . metFORMIN (GLUCOPHAGE) 1000 MG tablet Take 1000mg (1 tablets) 2 times a day. 60 tablet 11  . mirtazapine (REMERON) 30 MG tablet     . nicotine polacrilex (NICORETTE) 4 MG gum Take 1 each (4 mg total) by mouth as needed for smoking cessation. 100 tablet 0  . traMADol (ULTRAM) 50 MG tablet Take 1 tablet (50 mg total) by mouth every 6 (six) hours as needed. 60 tablet 1  . traZODone (DESYREL) 100 MG tablet Take 250 mg by mouth at bedtime.     . brimonidine (ALPHAGAN) 0.2 % ophthalmic solution Place 1 drop into both eyes 2 (two) times daily.    . citalopram (CELEXA) 20 MG tablet TK 1 T PO QHS  2  . docusate sodium  (COLACE) 100 MG capsule Take 1 capsule (100 mg total) by mouth 2 (two) times daily. (Patient not taking: Reported on 08/05/2015) 30 capsule 0  . DULoxetine (CYMBALTA) 30 MG capsule Take 1 capsule (30 mg total) by mouth daily. 14 capsule 0  . gabapentin (NEURONTIN) 300 MG capsule Take 2 capsules (600 mg total) by mouth 3 (three) times daily. (Patient not taking: Reported on 03/31/2015) 120 capsule 2  . glucose blood (ACCU-CHEK AVIVA PLUS) test strip Use to test blood sugar 3 times daily. diag code E11.41. Insulin dependent 100 each 11  . ILEVRO 0.3 % ophthalmic suspension   1  . Insulin Pen Needle (VALUMARK PEN NEEDLES) 31G X 8 MM MISC 1 each by Does not apply route daily. 100 each 3  . Lancet Devices (ADJUSTABLE LANCING DEVICE) MISC     . latanoprost (XALATAN) 0.005 % ophthalmic solution Place 1 drop into both eyes 2 (two) times daily.    . lidocaine (LIDODERM) 5 % Place 1 patch onto the skin daily. Remove & Discard patch within 12 hours or as directed by MD 30 patch 0  . loratadine (CLARITIN) 10 MG tablet Take 1 tablet (10 mg total) by mouth daily. 30 tablet 2  . losartan (COZAAR) 50 MG tablet Take 1 tablet (50 mg total) by mouth 2 (two) times daily. 180 tablet 1  . LOTEMAX 0.5 % GEL   1  . magnesium citrate SOLN Take 296 mLs (1 Bottle total) by mouth once. 195 mL 0  . meloxicam (MOBIC) 7.5 MG tablet Take 1 tablet (7.5 mg total) by mouth daily. (Patient not taking: Reported on 08/05/2015) 14 tablet 0  . Multiple Vitamin (MULTIVITAMIN WITH MINERALS) TABS tablet Take 1 tablet by mouth daily.    . naproxen (NAPROSYN) 500 MG tablet Take 1 tablet (500 mg total) by mouth 2 (two) times daily with a meal. (Patient not taking: Reported on 08/05/2015) 60 tablet 1  . ofloxacin (OCUFLOX) 0.3 % ophthalmic solution INSTILL 1 DROP INTO SURGICAL EYE FOUR TIMES DAILY. START THE SUNDAY BEFORE SURGERY  1  . pantoprazole (PROTONIX) 40 MG tablet Take 1 tablet (40 mg total) by mouth daily. (Patient not taking: Reported on  04/21/2015) 30 tablet 0  . polyethylene glycol (MIRALAX / GLYCOLAX) packet Take 17 g by mouth daily. 14 each 0  . prednisoLONE acetate (PRED FORTE) 1 % ophthalmic suspension Place 1 drop into both eyes 2 (two) times daily.    . rosuvastatin (CRESTOR) 20 MG tablet Take 1 tablet (20 mg total) by mouth at bedtime. 30 tablet 11  . sodium chloride (OCEAN) 0.65 % SOLN nasal spray Place 2   sprays into both nostrils as needed for congestion. 60 mL 2   No current facility-administered medications for this visit.   Family History  Problem Relation Age of Onset  . Heart disease Mother 60  . Hypertension Mother   . Heart disease Father 60  . Aneurysm Brother   . Aneurysm Sister   . Colon cancer Neg Hx   . Esophageal cancer Neg Hx   . Stomach cancer Neg Hx    Social History   Social History  . Marital Status: Divorced    Spouse Name: N/A  . Number of Children: 1  . Years of Education: 12   Occupational History  .  Unemployed   Social History Main Topics  . Smoking status: Current Every Day Smoker -- 0.40 packs/day for 20 years    Types: Cigarettes  . Smokeless tobacco: Current User    Types: Chew     Comment: Nicorette gum.  . Alcohol Use: No  . Drug Use: No  . Sexual Activity: Not Currently    Birth Control/ Protection: Surgical   Other Topics Concern  . None   Social History Narrative   Review of Systems: Review of Systems  Constitutional: Negative for fever, chills and malaise/fatigue.  HENT: Negative for ear pain.   Eyes: Negative for blurred vision and pain.  Respiratory: Negative for cough, shortness of breath and wheezing.   Cardiovascular: Negative for chest pain, palpitations and leg swelling.  Gastrointestinal: Negative for nausea, vomiting, abdominal pain and diarrhea.  Genitourinary: Negative.   Musculoskeletal: Positive for back pain and joint pain.  Skin: Negative for itching and rash.  Neurological: Negative for tingling, sensory change, focal weakness and  headaches.     Objective:  Physical Exam: Filed Vitals:   08/05/15 0829 08/05/15 0830  BP: 141/69   Pulse: 69   Temp: 98.2 F (36.8 C)   TempSrc: Oral   Height:  5' 7" (1.702 m)  Weight: 182 lb 12.8 oz (82.918 kg)   SpO2: 100%    Physical Exam  Constitutional: She is oriented to person, place, and time. She appears well-developed and well-nourished. No distress.  HENT:  Head: Normocephalic and atraumatic.  Eyes: EOM are normal. Pupils are equal, round, and reactive to light.  Neck: Neck supple. No tracheal deviation present.  Cardiovascular: Normal rate, regular rhythm and intact distal pulses.   Pulmonary/Chest: Effort normal and breath sounds normal. No respiratory distress. She has no wheezes. She has no rales.  Abdominal: Soft. Bowel sounds are normal. She exhibits no distension. There is no tenderness.  Musculoskeletal: She exhibits tenderness. She exhibits no edema.  Pain on palpation of right lower back paraspinal muscles Pain upon internal rotation of L hip Decreased ROM of L hand middle finger DIP joint.   Neurological: She is alert and oriented to person, place, and time. No cranial nerve deficit.  Strength and sensation grossly intact in bilateral upper and lower extremities.     Assessment & Plan:   

## 2015-08-07 NOTE — Progress Notes (Addendum)
Internal Medicine Clinic Attending  I saw and evaluated the patient.  I personally confirmed the key portions of the history and exam documented by Dr. Marlowe Sax and I reviewed pertinent patient test results.  The assessment, diagnosis, and plan were formulated together and I agree with the documentation in the resident's note. Pt does have several areas of pain and the severity changes freq and the location changes freq also. [facility] week [facility] Nori Riis had suggested MRI of her R hip but now her pain is in L hip. Due to migratory nature and severity of her pain I will not order MRI and encourage pt to F/U sports med. Would discourage controlled substances and start trial of Cymbalta.

## 2015-08-14 ENCOUNTER — Ambulatory Visit: Payer: Medicare Other | Admitting: Internal Medicine

## 2015-08-21 ENCOUNTER — Ambulatory Visit (INDEPENDENT_AMBULATORY_CARE_PROVIDER_SITE_OTHER): Payer: Medicare Other | Admitting: Internal Medicine

## 2015-08-21 ENCOUNTER — Encounter: Payer: Self-pay | Admitting: Internal Medicine

## 2015-08-21 VITALS — BP 123/88 | HR 86 | Temp 97.6°F | Ht 67.0 in | Wt 182.1 lb

## 2015-08-21 DIAGNOSIS — E1149 Type 2 diabetes mellitus with other diabetic neurological complication: Secondary | ICD-10-CM

## 2015-08-21 DIAGNOSIS — E1165 Type 2 diabetes mellitus with hyperglycemia: Secondary | ICD-10-CM

## 2015-08-21 DIAGNOSIS — M545 Low back pain, unspecified: Secondary | ICD-10-CM

## 2015-08-21 DIAGNOSIS — Z79899 Other long term (current) drug therapy: Secondary | ICD-10-CM

## 2015-08-21 DIAGNOSIS — IMO0002 Reserved for concepts with insufficient information to code with codable children: Secondary | ICD-10-CM

## 2015-08-21 DIAGNOSIS — M25552 Pain in left hip: Secondary | ICD-10-CM

## 2015-08-21 DIAGNOSIS — G8929 Other chronic pain: Secondary | ICD-10-CM | POA: Diagnosis not present

## 2015-08-21 DIAGNOSIS — Z794 Long term (current) use of insulin: Secondary | ICD-10-CM

## 2015-08-21 MED ORDER — DICLOFENAC SODIUM 75 MG PO TBEC: 75.0000 mg | DELAYED_RELEASE_TABLET | Freq: Two times a day (BID) | ORAL | Status: DC | PRN

## 2015-08-21 MED ORDER — INSULIN LISPRO 100 UNIT/ML (KWIKPEN): 12.0000 [IU] | PEN_INJECTOR | Freq: Every day | SUBCUTANEOUS | Status: DC

## 2015-08-21 NOTE — Patient Instructions (Addendum)
Ms. Forker it was nice seeing you today. Please return for a follow up visit.  Medication for hip and back pain:  Diclofenac 75 mg EC: Take 1 tablet (75 mg total) by mouth 2 (two) times daily as needed for moderate pain (with food).  Stop taking all other pain medications as discussed today.   Medications for diabetes:  Lantus pen: Inject 35 units subcutaneously every night at bedtime  Humalog pen: Inject 12 units subcutaneously once daily. Eat dinner within 10 minutes of injection.   Metformin 1000 mg: 1 tablet by mouth twice daily

## 2015-08-23 MED ORDER — DICLOFENAC SODIUM 75 MG PO TBEC: 75.0000 mg | DELAYED_RELEASE_TABLET | Freq: Two times a day (BID) | ORAL | Status: DC | PRN

## 2015-08-23 NOTE — Assessment & Plan Note (Addendum)
Patient still complaining of L hip pain. States none of the medications she has tried in the past have helped with the pain - including Meloxicam, Gabapentin, Lidoderm, Naproxen, Tylenol, Ibuprofen, and Tramadol. States she has been to sports medicine and their treatments have not helped her at all. B/l hip and pelvic x-ray from 05/2015 showing mild degenerative changes and no acute abnormality. During her previous visit, she was prescribed Cymbalta for her chronic hip and lower back pain. Patient states the medication is not helping her and makes her "sick"/ nauseous. Pain is most likely 2/2 to ostearthritis. -Discontinued all the medications listed above and asked patient to try just Diclofenac for now.   - Diclofenac 75 mg EC: 1 tab BID prn -Explained to her that weight loss may help her significantly with her chronic hip and back pain.  -Suggested using assistive devices such as a cane for ambulation. This would help take weight off of the L hip. Patient reported having a cane at home and prefers trying the medication first.  -She has an appt with sports medicine on 08/25/15 with Dr. Nori Riis. -Reassess symptoms at f/u visit in 1 month. If she still continues to complain of pain the the next visit, send her to PT for a custom assistive walking device (cane).

## 2015-08-23 NOTE — Progress Notes (Signed)
Patient ID: Susan Holmes, female   DOB: 08/10/1956, 59 y.o., 59 y.o.   MRN: [id number]   Subjective:   Patient ID: Susan Holmes female   DOB: [birth year] 59 y.o.   MRN: [id number]  HPI: [name] is a 59 y.o. F with a PMHx of DM2, HTN, HLD, anxiety, and depression presenting for a follow up of her chronic hip pain. Please refer to problem based charting for details.     Past Medical History  Diagnosis Date  . Hepatitis B infection pt unsure    resolved  . Diabetes mellitus, type II   . HTN (hypertension)   . Hyperlipidemia   . Congenital blindness     L eye  . Tobacco abuse     .5 PPD smoker  . Anxiety   . Depression   . Arthritis   . Abnormal stress test   . Glaucoma    Current Outpatient Prescriptions  Medication Sig Dispense Refill  . ACCU-CHEK FASTCLIX LANCETS MISC Check blood sugar 3x a day as instructed dx cod 250.00 insulin requiring 102 each 5  . acetaZOLAMIDE (DIAMOX) 500 MG capsule     . albuterol-ipratropium (COMBIVENT) 18-103 MCG/ACT inhaler Inhale 2 puffs into the lungs every 4 (four) hours as needed for wheezing or shortness of breath.    . Alcohol Swabs (ALCOHOL PREP) 70 % PADS     . amLODipine (NORVASC) 5 MG tablet Take 1 tablet (5 mg total) by mouth daily. 90 tablet 1  . aspirin 81 MG EC tablet TAKE 1 TABLET BY MOUTH EVERY DAY 30 tablet 0  . Blood Glucose Calibration (ACCU-CHEK SMARTVIEW CONTROL) LIQD     . Blood Glucose Monitoring Suppl (ACCU-CHEK AVIVA PLUS) W/DEVICE KIT Use to check blood sugar 3 to 4 times daily. diag code 250.62. Insulin dependent 1 kit 0  . brimonidine (ALPHAGAN) 0.2 % ophthalmic solution Place 1 drop into both eyes 2 (two) times daily.    . citalopram (CELEXA) 20 MG tablet TK 1 T PO QHS  2  . clonazePAM (KLONOPIN) 0.5 MG tablet     . cromolyn (OPTICROM) 4 % ophthalmic solution INT 1 GTT INTO OU QID  0  . diclofenac (VOLTAREN) 75 MG EC tablet Take 1 tablet (75 mg total) by mouth 2 (two) times daily as needed for moderate pain (with  food). 180 tablet 3  . docusate sodium (COLACE) 100 MG capsule Take 1 capsule (100 mg total) by mouth 2 (two) times daily. (Patient not taking: Reported on 08/05/2015) 30 capsule 0  . dorzolamide-timolol (COSOPT) 22.3-6.8 MG/ML ophthalmic solution Place 1 drop into both eyes 2 (two) times daily.    . fluorometholone (FML) 0.1 % ophthalmic suspension     . glipiZIDE (GLUCOTROL) 10 MG tablet TAKE 1 TABLET BY MOUTH DAILY BEFORE BREAKFAST 30 tablet 3  . glucose blood (ACCU-CHEK AVIVA PLUS) test strip Use to test blood sugar 3 times daily. diag code E11.41. Insulin dependent 100 each 11  . ILEVRO 0.3 % ophthalmic suspension   1  . insulin lispro (HUMALOG KWIKPEN) 100 UNIT/ML KiwkPen Inject 0.12 mLs (12 Units total) into the skin daily. Eat dinner within 10 minutes of injection. 15 mL 3  . Insulin Pen Needle (VALUMARK PEN NEEDLES) 31G X 8 MM MISC 1 each by Does not apply route daily. 100 each 3  . Lancet Devices (ADJUSTABLE LANCING DEVICE) MISC     . LANTUS SOLOSTAR 100 UNIT/ML Solostar Pen INJECT 35 UNITS SUBCUTANEOUSLY EVERY NIGHT AT BEDTIME 15 mL 5  .  latanoprost (XALATAN) 0.005 % ophthalmic solution Place 1 drop into both eyes 2 (two) times daily.    Marland Kitchen loratadine (CLARITIN) 10 MG tablet Take 1 tablet (10 mg total) by mouth daily. 30 tablet 2  . losartan (COZAAR) 50 MG tablet Take 1 tablet (50 mg total) by mouth 2 (two) times daily. 180 tablet 1  . LOTEMAX 0.5 % GEL   1  . magnesium citrate SOLN Take 296 mLs (1 Bottle total) by mouth once. 195 mL 0  . metFORMIN (GLUCOPHAGE) 1000 MG tablet Take 1035m (1 tablets) 2 times a day. 60 tablet 11  . mirtazapine (REMERON) 30 MG tablet     . Multiple Vitamin (MULTIVITAMIN WITH MINERALS) TABS tablet Take 1 tablet by mouth daily.    . nicotine polacrilex (NICORETTE) 4 MG gum Take 1 each (4 mg total) by mouth as needed for smoking cessation. 100 tablet 0  . ofloxacin (OCUFLOX) 0.3 % ophthalmic solution INSTILL 1 DROP INTO SURGICAL EYE FOUR TIMES DAILY. START THE  SUNDAY BEFORE SURGERY  1  . pantoprazole (PROTONIX) 40 MG tablet Take 1 tablet (40 mg total) by mouth daily. (Patient not taking: Reported on 04/21/2015) 30 tablet 0  . polyethylene glycol (MIRALAX / GLYCOLAX) packet Take 17 g by mouth daily. 14 each 0  . prednisoLONE acetate (PRED FORTE) 1 % ophthalmic suspension Place 1 drop into both eyes 2 (two) times daily.    . rosuvastatin (CRESTOR) 20 MG tablet Take 1 tablet (20 mg total) by mouth at bedtime. 30 tablet 11  . sodium chloride (OCEAN) 0.65 % SOLN nasal spray Place 2 sprays into both nostrils as needed for congestion. 60 mL 2  . traZODone (DESYREL) 100 MG tablet Take 250 mg by mouth at bedtime.      No current facility-administered medications for this visit.   Family History  Problem Relation Age of Onset  . Heart disease Mother 622 . Hypertension Mother   . Heart disease Father 669 . Aneurysm Brother   . Aneurysm Sister   . Colon cancer Neg Hx   . Esophageal cancer Neg Hx   . Stomach cancer Neg Hx    Social History   Social History  . Marital Status: Divorced    Spouse Name: N/A  . Number of Children: 1  . Years of Education: 12   Occupational History  .  Unemployed   Social History Main Topics  . Smoking status: Current Every Day Smoker -- 0.40 packs/day for 20 years    Types: Cigarettes  . Smokeless tobacco: Current User    Types: Chew     Comment: Nicorette gum.  .Marland KitchenAlcohol Use: No  . Drug Use: No  . Sexual Activity: Not Currently    Birth Control/ Protection: Surgical   Other Topics Concern  . None   Social History Narrative   Review of Systems: Review of Systems  Constitutional: Negative for fever and chills.  HENT: Negative for ear pain.   Eyes: Negative for blurred vision and pain.  Respiratory: Negative for cough, shortness of breath and wheezing.   Cardiovascular: Negative for chest pain, palpitations and leg swelling.  Gastrointestinal: Positive for nausea. Negative for vomiting, abdominal pain,  diarrhea and constipation.  Musculoskeletal: Positive for back pain and joint pain.  Skin: Negative for itching and rash.  Neurological: Negative for dizziness, sensory change, focal weakness and headaches.    Objective:  Physical Exam: Filed Vitals:   08/21/15 1514  BP: 123/88  Pulse: 86  Temp: 97.6 F (36.4 C)  TempSrc: Oral  Height: _0  (1.702 m)  Weight: 182 lb 1.6 oz (82.6 kg)  SpO2: 100%   Physical Exam  Constitutional: She is oriented to person, place, and time. She appears well-developed and well-nourished. No distress.  HENT:  Head: Normocephalic and atraumatic.  Eyes: EOM are normal. Pupils are equal, round, and reactive to light.  Neck: Neck supple. No tracheal deviation present.  Cardiovascular: Normal rate, regular rhythm and intact distal pulses.   Pulmonary/Chest: Effort normal. No respiratory distress. She has no wheezes. She has no rales.  Abdominal: Soft. Bowel sounds are normal. She exhibits no distension. There is no tenderness.  Musculoskeletal: She exhibits tenderness. She exhibits no edema.  Lumbar paraspinal muscles on the right tender to palpation. Decreased ROM of L hip due to pain.   Neurological: She is alert and oriented to person, place, and time. No cranial nerve deficit.  Strength and sensation grossly intact in bilateral upper and lower extremities.    Skin: Skin is warm and dry. No rash noted. No erythema.    Assessment & Plan:

## 2015-08-23 NOTE — Assessment & Plan Note (Addendum)
Patient brought her home glucose meter with her today. Readings showing highest value of 593 and lowest 149. Most recent A1C 9.8, goal <7. She is checking her blood glucose once a day because she only eats one meal in the day (dinner). States she sometimes has numbness and tingling of her fingers/ toes and Gabapentin has not helped. Patient has glaucoma and cannot tell if her vision has changed. Foot and eye exams are UTD. She is aware of symtoms of hypoglycemia and denies having any. Currently taking Lantus 35 u qhs, Humalog 8 u with dinner, and Metformin 1000 mg BID. -Continue current mgmt with one change: increase Humalog to 12 u with dinner -Discussed the importance of eating 3 meals/ day but pt states she cannot afford to do that. As such, short acting insulin can only be given once a day. I do not feel comfortable increasing the dose of her basal insulin at night because pt does not eat breakfast and it would put her at risk for hypoglycemia. -F/u visit in 1 month, check A1C.

## 2015-08-23 NOTE — Assessment & Plan Note (Signed)
Pt has a history of chronic lower back pain. During her previous visit she complained of pain on the right side of her lower back. Physical exam showing R sided lumbar paraspinal muscle tenderness on palpation. No focal neurological symptoms or sciatica. Patient was given a trial of Cymbalta during her previous visit and states it is making her "sick"/ nauseous. As per patient, sports medicine and no medication in the past has helped with her chronic lower back pain. The pain appears to be musculoskeletal in nature.  -refer to note on L hip pain

## 2015-08-25 ENCOUNTER — Ambulatory Visit: Payer: Medicare Other | Admitting: Family Medicine

## 2015-08-26 NOTE — Progress Notes (Signed)
I saw and evaluated the patient.  I personally confirmed the key portions of Dr. Rathore's history and exam and reviewed pertinent patient test results.  The assessment, diagnosis, and plan were formulated together and I agree with the documentation in the resident's note. 

## 2015-09-02 ENCOUNTER — Ambulatory Visit: Payer: Medicare Other | Admitting: Pharmacist

## 2015-09-03 NOTE — Addendum Note (Signed)
Addended by: Hulan Fray on: 09/03/2015 09:19 PM   Modules accepted: Orders

## 2015-09-05 ENCOUNTER — Ambulatory Visit (INDEPENDENT_AMBULATORY_CARE_PROVIDER_SITE_OTHER): Payer: Medicare Other | Admitting: Family Medicine

## 2015-09-05 ENCOUNTER — Encounter: Payer: Self-pay | Admitting: Family Medicine

## 2015-09-05 VITALS — BP 144/76 | HR 92 | Ht 67.0 in | Wt 178.0 lb

## 2015-09-05 DIAGNOSIS — M25552 Pain in left hip: Secondary | ICD-10-CM | POA: Diagnosis not present

## 2015-09-05 NOTE — Patient Instructions (Signed)
I have given you some information on glucosamine. Some people find this helpful for joint pains. I would also talk with your regular doctor about possibly using lidocaine pain patches. He said those have been helpful in the past and your PCP could prescribe dose if they thought it would be useful for you.  Regarding your hip pain, we have tried injection therapy and it did not help you. If you continue to have pain, I would recommend your PCP refer you to an orthopedist for further evaluation.  Great to see you!

## 2015-09-05 NOTE — Progress Notes (Signed)
   Subjective:    Patient ID: ASYIA HORNUNG, female    DOB: 1956/09/28, 59 y.o.   MRN: [id number]  HPI Follow-up right hip pain. Today she's complaining more of left hip pain. She tells me that it moves from one side to the other. Her PCP had her stop the tramadol.  Left hip is aching, 4 out of 10. Worse with standing. Does not awaken her at night. No leg weakness or numbness. No bowel or bladder incontinence. No history of injury to left hip.   Review of Systems No unusual weight change, fever, sweats, chills. See history of present illness above for distal part her review of systems.    Objective:   Physical Exam  Vital signs are reviewed GEN.: Well-developed female no acute distress HIPS: Slight limited to external and internal rotation on the right hip, limited by pain. Left hip has full range of motion external and internal rotation. Straight leg raise negative bilaterally. Lower strandy strength 5 out of 5 in flexion extension at the hip. NEURO: Intact sensation soft touch bilaterally in the feet.      Assessment & Plan:

## 2015-09-05 NOTE — Assessment & Plan Note (Signed)
Left hip pain is more prominent for her today. I reviewed her note from her PCP. I think at this point we had tried a corticosteroid njection for her right hip and that was of no help. Her left hip pain today seems much more diffuse and she has no pain with internal/external rotation so I don't think this is in the joint. Nothing on exam concerns me for lower back pathology. She did have lumbosacral spine MRI in 2011 which showed some very mild disc bulges but the right side was affected, not the left. We discussed options which would include hip MRI but at this point and not sure which hip I would perform MRI on. period since her symptoms are now "migratory", I will return her to the care of her PCP. I think the Cymbalta is a good idea. She asks about over-the-counter supplements and I have given her some information on chondroiton sulfate. Since the cortical corticosteroid into the hip did not improve her symptoms, if her pain significantly worsened I will consider orthopedic evaluation.

## 2015-09-29 ENCOUNTER — Ambulatory Visit (INDEPENDENT_AMBULATORY_CARE_PROVIDER_SITE_OTHER): Payer: Medicare Other | Admitting: Internal Medicine

## 2015-09-29 ENCOUNTER — Encounter: Payer: Self-pay | Admitting: Internal Medicine

## 2015-09-29 VITALS — BP 156/74 | HR 65 | Temp 98.0°F | Wt 181.7 lb

## 2015-09-29 DIAGNOSIS — N898 Other specified noninflammatory disorders of vagina: Secondary | ICD-10-CM | POA: Insufficient documentation

## 2015-09-29 DIAGNOSIS — E1149 Type 2 diabetes mellitus with other diabetic neurological complication: Secondary | ICD-10-CM | POA: Diagnosis not present

## 2015-09-29 DIAGNOSIS — IMO0002 Reserved for concepts with insufficient information to code with codable children: Secondary | ICD-10-CM

## 2015-09-29 DIAGNOSIS — E1165 Type 2 diabetes mellitus with hyperglycemia: Secondary | ICD-10-CM | POA: Diagnosis not present

## 2015-09-29 DIAGNOSIS — Z794 Long term (current) use of insulin: Secondary | ICD-10-CM

## 2015-09-29 DIAGNOSIS — Z7984 Long term (current) use of oral hypoglycemic drugs: Secondary | ICD-10-CM

## 2015-09-29 DIAGNOSIS — M25552 Pain in left hip: Secondary | ICD-10-CM

## 2015-09-29 LAB — BASIC METABOLIC PANEL
Anion gap: 8 (ref 5–15)
BUN: 10 mg/dL (ref 6–20)
CO2: 26 mmol/L (ref 22–32)
Calcium: 9.8 mg/dL (ref 8.9–10.3)
Chloride: 99 mmol/L — ABNORMAL LOW (ref 101–111)
Creatinine, Ser: 0.92 mg/dL (ref 0.44–1.00)
GFR calc Af Amer: >60 mL/min (ref >60)
GFR calc non Af Amer: >60 mL/min (ref >60)
Glucose, Bld: 503 mg/dL — ABNORMAL HIGH (ref 65–99)
Potassium: 4.2 mmol/L (ref 3.5–5.1)
Sodium: 133 mmol/L — ABNORMAL LOW (ref 135–145)

## 2015-09-29 LAB — GLUCOSE, CAPILLARY: Glucose-Capillary: 498 mg/dL — ABNORMAL HIGH (ref 65–99)

## 2015-09-29 LAB — POCT GLYCOSYLATED HEMOGLOBIN (HGB A1C): Hemoglobin A1C: >14

## 2015-09-29 MED ORDER — DICLOFENAC SODIUM 75 MG PO TBEC
75.0000 mg | DELAYED_RELEASE_TABLET | Freq: Two times a day (BID) | ORAL | Status: DC | PRN
Start: 2015-09-29 — End: 2016-06-27

## 2015-09-29 MED ORDER — DULOXETINE HCL 30 MG PO CPEP: 30.0000 mg | ORAL_CAPSULE | Freq: Every day | ORAL | Status: DC

## 2015-09-29 MED ORDER — INSULIN ASPART 100 UNIT/ML ~~LOC~~ SOLN
12.0000 [IU] | Freq: Once | SUBCUTANEOUS | Status: AC
  Administered 2015-09-29: 12 [IU] via SUBCUTANEOUS

## 2015-09-29 MED ORDER — INSULIN GLARGINE 100 UNIT/ML SOLOSTAR PEN: 40.0000 [IU] | PEN_INJECTOR | Freq: Every day | SUBCUTANEOUS | Status: DC

## 2015-09-29 MED ORDER — FLUCONAZOLE 150 MG PO TABS: 150.0000 mg | ORAL_TABLET | Freq: Every day | ORAL | Status: DC

## 2015-09-29 NOTE — Progress Notes (Signed)
Patient ID: QUINLYNN CUTHBERT, female   DOB: Aug 07, 1956, 59 y.o.   MRN: [id number]   Subjective:   Patient ID: Susan Holmes female   DOB: 1956/09/05 59 y.o.   MRN: [id number]  HPI: Ms.Jaleen L Matuska is a 59 y.o.  F with a PMHx of conditions listed below presenting for a follow-up of her chronic hip pain and diabetes. Patient also has a new complaint of vaginal pruritis. She reports being post-menopausal and experiencing vaginal pruritis for the past 2 weeks. Denies noticing any vaginal discharge or odor. She is not currently sexually active and denies having any dysuria or urgency. Patient does report having increased urinary frequency and polydipsia.     Past Medical History  Diagnosis Date  . Hepatitis B infection pt unsure    resolved  . Diabetes mellitus, type II ([place])   . HTN (hypertension)   . Hyperlipidemia   . Congenital blindness     L eye  . Tobacco abuse     .5 PPD smoker  . Anxiety   . Depression   . Arthritis   . Abnormal stress test   . Glaucoma    Current Outpatient Prescriptions  Medication Sig Dispense Refill  . ACCU-CHEK FASTCLIX LANCETS MISC Check blood sugar 3x a day as instructed dx cod 250.00 insulin requiring 102 each 5  . acetaZOLAMIDE (DIAMOX) 500 MG capsule     . albuterol-ipratropium (COMBIVENT) 18-103 MCG/ACT inhaler Inhale 2 puffs into the lungs every 4 (four) hours as needed for wheezing or shortness of breath.    . Alcohol Swabs (ALCOHOL PREP) 70 % PADS     . amLODipine (NORVASC) 5 MG tablet Take 1 tablet (5 mg total) by mouth daily. 90 tablet 1  . aspirin 81 MG EC tablet TAKE 1 TABLET BY MOUTH EVERY DAY 30 tablet 0  . Blood Glucose Calibration (ACCU-CHEK SMARTVIEW CONTROL) LIQD     . Blood Glucose Monitoring Suppl (ACCU-CHEK AVIVA PLUS) W/DEVICE KIT Use to check blood sugar 3 to 4 times daily. diag code 250.62. Insulin dependent 1 kit 0  . brimonidine (ALPHAGAN) 0.2 % ophthalmic solution Place 1 drop into both eyes 2 (two) times daily.    .  citalopram (CELEXA) 20 MG tablet TK 1 T PO QHS  2  . clonazePAM (KLONOPIN) 0.5 MG tablet     . cromolyn (OPTICROM) 4 % ophthalmic solution INT 1 GTT INTO OU QID  0  . cyclopentolate (CYCLODRYL,CYCLOGYL) 1 % ophthalmic solution INT 1 GTT IN OU QID  2  . diclofenac (VOLTAREN) 75 MG EC tablet Take 1 tablet (75 mg total) by mouth 2 (two) times daily as needed for moderate pain (with food). 180 tablet 0  . docusate sodium (COLACE) 100 MG capsule Take 1 capsule (100 mg total) by mouth 2 (two) times daily. (Patient not taking: Reported on 08/05/2015) 30 capsule 0  . dorzolamide-timolol (COSOPT) 22.3-6.8 MG/ML ophthalmic solution Place 1 drop into both eyes 2 (two) times daily.    . DULoxetine (CYMBALTA) 30 MG capsule Take 1 capsule (30 mg total) by mouth daily. 30 capsule 0  . fluconazole (DIFLUCAN) 150 MG tablet Take 1 tablet (150 mg total) by mouth daily. 1 tablet 0  . fluorometholone (FML) 0.1 % ophthalmic suspension     . glipiZIDE (GLUCOTROL) 10 MG tablet TAKE 1 TABLET BY MOUTH DAILY BEFORE BREAKFAST 30 tablet 3  . glucose blood (ACCU-CHEK AVIVA PLUS) test strip Use to test blood sugar 3 times daily. diag code E11.41. Insulin  dependent 100 each 11  . ILEVRO 0.3 % ophthalmic suspension   1  . Insulin Glargine (LANTUS SOLOSTAR) 100 UNIT/ML Solostar Pen Inject 40 Units into the skin at bedtime. 15 mL 3  . insulin lispro (HUMALOG KWIKPEN) 100 UNIT/ML KiwkPen Inject 0.12 mLs (12 Units total) into the skin daily. Eat dinner within 10 minutes of injection. 15 mL 3  . Insulin Pen Needle (VALUMARK PEN NEEDLES) 31G X 8 MM MISC 1 each by Does not apply route daily. 100 each 3  . Lancet Devices (ADJUSTABLE LANCING DEVICE) MISC     . latanoprost (XALATAN) 0.005 % ophthalmic solution Place 1 drop into both eyes 2 (two) times daily.    Marland Kitchen loratadine (CLARITIN) 10 MG tablet Take 1 tablet (10 mg total) by mouth daily. 30 tablet 2  . losartan (COZAAR) 50 MG tablet Take 1 tablet (50 mg total) by mouth 2 (two) times  daily. 180 tablet 1  . LOTEMAX 0.5 % GEL   1  . magnesium citrate SOLN Take 296 mLs (1 Bottle total) by mouth once. 195 mL 0  . metFORMIN (GLUCOPHAGE) 1000 MG tablet Take 1038m (1 tablets) 2 times a day. 60 tablet 11  . mirtazapine (REMERON) 30 MG tablet     . Multiple Vitamin (MULTIVITAMIN WITH MINERALS) TABS tablet Take 1 tablet by mouth daily.    . nicotine polacrilex (NICORETTE) 4 MG gum Take 1 each (4 mg total) by mouth as needed for smoking cessation. 100 tablet 0  . ofloxacin (OCUFLOX) 0.3 % ophthalmic solution INSTILL 1 DROP INTO SURGICAL EYE FOUR TIMES DAILY. START THE SUNDAY BEFORE SURGERY  1  . pantoprazole (PROTONIX) 40 MG tablet Take 1 tablet (40 mg total) by mouth daily. (Patient not taking: Reported on 04/21/2015) 30 tablet 0  . polyethylene glycol (MIRALAX / GLYCOLAX) packet Take 17 g by mouth daily. 14 each 0  . prednisoLONE acetate (PRED FORTE) 1 % ophthalmic suspension Place 1 drop into both eyes 2 (two) times daily.    . rosuvastatin (CRESTOR) 20 MG tablet Take 1 tablet (20 mg total) by mouth at bedtime. 30 tablet 11  . sodium chloride (OCEAN) 0.65 % SOLN nasal spray Place 2 sprays into both nostrils as needed for congestion. 60 mL 2  . traMADol (ULTRAM) 50 MG tablet TK 1 T PO  Q 6 H PRN  1  . traZODone (DESYREL) 100 MG tablet Take 250 mg by mouth at bedtime.      No current facility-administered medications for this visit.   Family History  Problem Relation Age of Onset  . Heart disease Mother 662 . Hypertension Mother   . Heart disease Father 662 . Aneurysm Brother   . Aneurysm Sister   . Colon cancer Neg Hx   . Esophageal cancer Neg Hx   . Stomach cancer Neg Hx    Social History   Social History  . Marital Status: Divorced    Spouse Name: N/A  . Number of Children: 1  . Years of Education: 12   Occupational History  .  Unemployed   Social History Main Topics  . Smoking status: Current Every Day Smoker -- 0.40 packs/day for 20 years    Types: Cigarettes   . Smokeless tobacco: Current User    Types: Chew     Comment: Nicorette gum.  .Marland KitchenAlcohol Use: No  . Drug Use: No  . Sexual Activity: Not Currently    Birth Control/ Protection: Surgical   Other Topics Concern  .  None   Social History Narrative   Review of Systems: Review of Systems  Constitutional: Negative for fever and chills.  HENT: Negative for ear pain.   Eyes: Negative for blurred vision and pain.  Respiratory: Negative for cough and shortness of breath.   Cardiovascular: Negative for chest pain and leg swelling.  Gastrointestinal: Negative for nausea, vomiting, abdominal pain, diarrhea and constipation.  Genitourinary: Positive for frequency. Negative for dysuria and urgency.  Musculoskeletal: Positive for joint pain. Negative for myalgias.  Skin: Positive for itching. Negative for rash.  Neurological: Negative for sensory change, focal weakness and headaches.     Objective:  Physical Exam: Filed Vitals:   09/29/15 0926  BP: 156/74  Pulse: 65  Temp: 98 F (36.7 C)  TempSrc: Oral  Weight: 181 lb 11.2 oz (82.419 kg)  SpO2: 96%   Physical Exam  Constitutional: She is oriented to person, place, and time. She appears well-developed and well-nourished. No distress.  HENT:  Head: Normocephalic and atraumatic.  Mouth/Throat: Oropharynx is clear and moist.  Eyes: EOM are normal. Pupils are equal, round, and reactive to light.  Neck: Neck supple. No tracheal deviation present.  Cardiovascular: Normal rate, regular rhythm and intact distal pulses.  Exam reveals no gallop and no friction rub.   No murmur heard. Pulmonary/Chest: Effort normal. No respiratory distress. She has no wheezes. She has no rales.  Abdominal: Soft. Bowel sounds are normal. She exhibits no distension. There is no tenderness.  Genitourinary: No vaginal discharge found.  Musculoskeletal: She exhibits no edema.  Neurological: She is alert and oriented to person, place, and time.  Skin: Skin is warm  and dry.   Assessment & Plan:

## 2015-09-29 NOTE — Assessment & Plan Note (Signed)
Pt is post-menopausal and complaining of vaginal pruritis x 2 weeks. Denies any recent sexual activity. Denies noticing any vaginal discharge or odor. Denies having any dysuria or urgency. However, does report having polydipsia and urinary frequency. Her A1c during this visit is >14. Patient's vaginal pruritis could be due to age related vaginal atrophy, however, vulvovaginal candidiasis is a possibility considering patient's uncontrolled diabetes.  -Empiric treatment for candidiasis with Fluconazole 150 mg one dose -If patient continues to have vaginal pruritis during follow-up visit, consider prescribing a vaginal estrogen cream.

## 2015-09-29 NOTE — Assessment & Plan Note (Signed)
Lab Results  Component Value Date   HGBA1C >14.0 09/29/2015   HGBA1C 9.8 06/04/2015   HGBA1C >14.0 03/17/2015     Assessment: Diabetes control:  uncontrolled  Progress toward A1C goal:   Poor. Patient's A1c is >14 today. Goal <7.  Comments: During her previous visit patient stated she only ate one meal in a day, and as such, I increased the dose of her Humalog from 8u to 12u with dinner. I did not feel comfortable increasing the dose of her basal insulin because patient denied eating breakfast, lunch, or any snacks during the day and increasing the dose of basal insulin would have possibly put her at risk for hypoglycemia. However, today when patient was asked about what she eats she mentioned she only eats one meal (dinner), however, eats snickers candy bars and fruit throughout the day. Patient was asked to bring her glucose meter with her today but failed to do so. She now reports having polydipsia and increased urinary frequency. Denies any changes in vision or symptoms of peripheral neuropathy.   Plan: Medications: Increased Lantus to 40u QHS. Continue Humalog 12u with dinner. Continue Metformin 1000mg  BID.  Home glucose monitoring: Frequency:  Patient reports checking her blood glucose BID, however, she did not bring a log or her glucose meter with her today. She has been advised to check her blood glucose TID.  Instruction/counseling given: Patient was offered a session with our diabetes educator [name] today but she declined stating she did not want to meet her. She has been advised to cut down her intake of candy bars and fruit. Instead I suggested she add vegetables to her diet. Patient has again been advised to eat 3 meals a day instead of just dinner.  Other plans:   -Given Novolog 12u along with snacks in the clinic today because her CBG was 498.   -I suggested she receive a bolus of IVF but patient declined.   -Stat BMP did not show an increased anion gap.   -Recheck blood glucose  at follow up visit in 1 week.

## 2015-09-29 NOTE — Assessment & Plan Note (Addendum)
Patient did see a sports medicine physician on and their note from 09/05/15 suggests patient continue taking Cymbalta due to the "migratory" nature of her hip pain. In addition, sports medicine suggested patient try chondroitin sulfate supplementation.   Patient states her L hip pain is better controlled at present. During her previous visit she was prescribed Diclofenac 75 mg BID prn because patient mentioned Cymbalta made her "sick." Today patient stated she wanted a refill on Cymbalta and did not complain of feeling "sick" or nauseous from the medication. It was not clear from the history whether the patient was still taking Cymbalta or stopped taking it.  -Refilled Cymbalta 30 mg qd. Reassess symptoms in 1 month.  -Continue Diclofenac    -Patient referred to physical therapy for an assistive walking device (cane)  -Reassess symptoms at next visit

## 2015-09-29 NOTE — Patient Instructions (Signed)
Please return for a follow up visit in 1 week.

## 2015-10-03 NOTE — Progress Notes (Signed)
Internal Medicine Clinic Attending  I saw and evaluated the patient.  I personally confirmed the key portions of the history and exam documented by Dr. Rathore and I reviewed pertinent patient test results.  The assessment, diagnosis, and plan were formulated together and I agree with the documentation in the resident's note.  

## 2015-10-06 ENCOUNTER — Encounter: Payer: Self-pay | Admitting: Internal Medicine

## 2015-10-06 ENCOUNTER — Ambulatory Visit (INDEPENDENT_AMBULATORY_CARE_PROVIDER_SITE_OTHER): Payer: Medicare Other | Admitting: Internal Medicine

## 2015-10-06 ENCOUNTER — Encounter: Payer: Self-pay | Admitting: Licensed Clinical Social Worker

## 2015-10-06 VITALS — BP 155/84 | HR 72 | Temp 97.8°F | Wt 185.8 lb

## 2015-10-06 DIAGNOSIS — Z23 Encounter for immunization: Secondary | ICD-10-CM | POA: Diagnosis not present

## 2015-10-06 DIAGNOSIS — IMO0002 Reserved for concepts with insufficient information to code with codable children: Secondary | ICD-10-CM

## 2015-10-06 DIAGNOSIS — E1149 Type 2 diabetes mellitus with other diabetic neurological complication: Secondary | ICD-10-CM | POA: Diagnosis not present

## 2015-10-06 DIAGNOSIS — N898 Other specified noninflammatory disorders of vagina: Secondary | ICD-10-CM | POA: Insufficient documentation

## 2015-10-06 DIAGNOSIS — E1165 Type 2 diabetes mellitus with hyperglycemia: Secondary | ICD-10-CM | POA: Diagnosis not present

## 2015-10-06 DIAGNOSIS — Z794 Long term (current) use of insulin: Secondary | ICD-10-CM | POA: Diagnosis not present

## 2015-10-06 DIAGNOSIS — Z0000 Encounter for general adult medical examination without abnormal findings: Secondary | ICD-10-CM

## 2015-10-06 LAB — GLUCOSE, CAPILLARY: Glucose-Capillary: 322 mg/dL — ABNORMAL HIGH (ref 65–99)

## 2015-10-06 MED ORDER — INSULIN LISPRO 100 UNIT/ML (KWIKPEN): PEN_INJECTOR | SUBCUTANEOUS | Status: DC

## 2015-10-06 NOTE — Patient Outreach (Signed)
[place] [facility]) Care Management  10/06/2015  Susan Holmes 08/04/1956 [id number]   Referral from MD, assigned [name], RN to outreach for [place] Management services.  Thanks, [name]. [place], [place] Assistant Phone: [phone] Fax: [phone]

## 2015-10-06 NOTE — Progress Notes (Signed)
Susan Holmes was referred to CSW for community care management/diabetes education.  Susan Holmes has already met with [facility] Diabetes Educator and is requesting in-home community assistance.  Susan Holmes has [facility] Medicare and Medicaid.  CSW discussed 2 potential programs available; [facility] and [facility].  Pt in agreement with referral to either agency.  Pt aware CSW will send referral to [facility] under [facility] Medicare, if pt is ineligible CSW will send referral to [facility] under Medicaid [place] Access.  CSW provided Susan Holmes with brochures from both agencies and confirmed telephone number.  Pt aware either agency will make the initial contact via telephone.  CSW will contact pt and notify of which agency has accept referral.

## 2015-10-06 NOTE — Assessment & Plan Note (Signed)
Lab Results  Component Value Date   HGBA1C >14.0 09/29/2015   HGBA1C 9.8 06/04/2015   HGBA1C >14.0 03/17/2015     Assessment: Diabetes control:  uncontrolled (goal A1c <7) Comments: CBG 322 today. Patient states she is now eating a snack in the morning around 10 am, lunch at noon, and dinner around 3-4 pm. States she has cut down on her intake of candy bars and fruit. Blood glucose meter showing an average value of 414 with high (583) and low (199). Patient has been checking her blood glucose once daily since her last visit one week ago. Prior to that she was checking her blood glucose once a week only as per meter. Patient reports having one episode of hypoglycemia (feeling shaky) since her last visit. States it occurred in the morning and she does not usually eat breakfast. During her previous visit, Lantus was increased to 40 units QHS and patient was continued on Humalog 12 u with dinner and Metformin 1000 mg BID.   Plan: Medications: Since patient has started eating lunch now, she has been advised to start taking Humalog 8 u with lunch. Continue Humalog 12u with dinner, Lantus 40 u QHS, and Metformin 1000 mg BID.  Home glucose monitoring: Advised patient to check her blood glucose 3 times daily before meals and whenever she feels shaky.   Instruction/counseling given: Advised her to eat at least 3 meals per day. Encouraged her to eat healthy and exercise.  Other plans: -Recheck CBG in 4 weeks.  [facility] referral for diabetes education.

## 2015-10-06 NOTE — Assessment & Plan Note (Signed)
Flu vaccine administered today.

## 2015-10-06 NOTE — Patient Instructions (Signed)
Ms. Couse it was nice seeing you today.   -Take Humalog: 8 units with lunch and 12 units with dinner.  -Check your blood sugar 3 times a day before meals.  -Return for a follow-up visit in 1 month. Remember to bring your meter with you.

## 2015-10-06 NOTE — Progress Notes (Signed)
Patient ID: Susan Holmes, female   DOB: [birth year], 59 y.o.   MRN: [id number]   Subjective:   Patient ID: Susan Holmes female   DOB: December 27, 1956 59 y.o.   MRN: [id number]  HPI: [name] is a 59 y.o. F with a PMHx of conditions listed below presenting to the clinic today for a follow-up of her diabetes. Please refer to the A&P section for the status of the patient's chronic medical conditions.     Past Medical History  Diagnosis Date  . Hepatitis B infection pt unsure    resolved  . Diabetes mellitus, type II ([place])   . HTN (hypertension)   . Hyperlipidemia   . Congenital blindness     L eye  . Tobacco abuse     .5 PPD smoker  . Anxiety   . Depression   . Arthritis   . Abnormal stress test   . Glaucoma    Current Outpatient Prescriptions  Medication Sig Dispense Refill  . ACCU-CHEK FASTCLIX LANCETS MISC Check blood sugar 3x a day as instructed dx cod 250.00 insulin requiring 102 each 5  . acetaZOLAMIDE (DIAMOX) 500 MG capsule     . albuterol-ipratropium (COMBIVENT) 18-103 MCG/ACT inhaler Inhale 2 puffs into the lungs every 4 (four) hours as needed for wheezing or shortness of breath.    . Alcohol Swabs (ALCOHOL PREP) 70 % PADS     . amLODipine (NORVASC) 5 MG tablet Take 1 tablet (5 mg total) by mouth daily. 90 tablet 1  . aspirin 81 MG EC tablet TAKE 1 TABLET BY MOUTH EVERY DAY 30 tablet 0  . Blood Glucose Calibration (ACCU-CHEK SMARTVIEW CONTROL) LIQD     . Blood Glucose Monitoring Suppl (ACCU-CHEK AVIVA PLUS) W/DEVICE KIT Use to check blood sugar 3 to 4 times daily. diag code 250.62. Insulin dependent 1 kit 0  . brimonidine (ALPHAGAN) 0.2 % ophthalmic solution Place 1 drop into both eyes 2 (two) times daily.    . citalopram (CELEXA) 20 MG tablet TK 1 T PO QHS  2  . clonazePAM (KLONOPIN) 0.5 MG tablet     . cromolyn (OPTICROM) 4 % ophthalmic solution INT 1 GTT INTO OU QID  0  . cyclopentolate (CYCLODRYL,CYCLOGYL) 1 % ophthalmic solution INT 1 GTT IN OU QID  2  .  diclofenac (VOLTAREN) 75 MG EC tablet Take 1 tablet (75 mg total) by mouth 2 (two) times daily as needed for moderate pain (with food). 180 tablet 0  . docusate sodium (COLACE) 100 MG capsule Take 1 capsule (100 mg total) by mouth 2 (two) times daily. (Patient not taking: Reported on 08/05/2015) 30 capsule 0  . dorzolamide-timolol (COSOPT) 22.3-6.8 MG/ML ophthalmic solution Place 1 drop into both eyes 2 (two) times daily.    . DULoxetine (CYMBALTA) 30 MG capsule Take 1 capsule (30 mg total) by mouth daily. 30 capsule 0  . fluconazole (DIFLUCAN) 150 MG tablet Take 1 tablet (150 mg total) by mouth daily. 1 tablet 0  . fluorometholone (FML) 0.1 % ophthalmic suspension     . glipiZIDE (GLUCOTROL) 10 MG tablet TAKE 1 TABLET BY MOUTH DAILY BEFORE BREAKFAST 30 tablet 3  . glucose blood (ACCU-CHEK AVIVA PLUS) test strip Use to test blood sugar 3 times daily. diag code E11.41. Insulin dependent 100 each 11  . ILEVRO 0.3 % ophthalmic suspension   1  . Insulin Glargine (LANTUS SOLOSTAR) 100 UNIT/ML Solostar Pen Inject 40 Units into the skin at bedtime. 15 mL 3  .  insulin lispro (HUMALOG KWIKPEN) 100 UNIT/ML KiwkPen Inject 8 units with lunch and 12 units with dinner. Eat meals within 10 minutes of injection. 15 mL 3  . Insulin Pen Needle (VALUMARK PEN NEEDLES) 31G X 8 MM MISC 1 each by Does not apply route daily. 100 each 3  . Lancet Devices (ADJUSTABLE LANCING DEVICE) MISC     . latanoprost (XALATAN) 0.005 % ophthalmic solution Place 1 drop into both eyes 2 (two) times daily.    Marland Kitchen loratadine (CLARITIN) 10 MG tablet Take 1 tablet (10 mg total) by mouth daily. 30 tablet 2  . losartan (COZAAR) 50 MG tablet Take 1 tablet (50 mg total) by mouth 2 (two) times daily. 180 tablet 1  . LOTEMAX 0.5 % GEL   1  . magnesium citrate SOLN Take 296 mLs (1 Bottle total) by mouth once. 195 mL 0  . metFORMIN (GLUCOPHAGE) 1000 MG tablet Take 1017m (1 tablets) 2 times a day. 60 tablet 11  . mirtazapine (REMERON) 30 MG tablet       . Multiple Vitamin (MULTIVITAMIN WITH MINERALS) TABS tablet Take 1 tablet by mouth daily.    . nicotine polacrilex (NICORETTE) 4 MG gum Take 1 each (4 mg total) by mouth as needed for smoking cessation. 100 tablet 0  . ofloxacin (OCUFLOX) 0.3 % ophthalmic solution INSTILL 1 DROP INTO SURGICAL EYE FOUR TIMES DAILY. START THE SUNDAY BEFORE SURGERY  1  . pantoprazole (PROTONIX) 40 MG tablet Take 1 tablet (40 mg total) by mouth daily. (Patient not taking: Reported on 04/21/2015) 30 tablet 0  . polyethylene glycol (MIRALAX / GLYCOLAX) packet Take 17 g by mouth daily. 14 each 0  . prednisoLONE acetate (PRED FORTE) 1 % ophthalmic suspension Place 1 drop into both eyes 2 (two) times daily.    . rosuvastatin (CRESTOR) 20 MG tablet Take 1 tablet (20 mg total) by mouth at bedtime. 30 tablet 11  . sodium chloride (OCEAN) 0.65 % SOLN nasal spray Place 2 sprays into both nostrils as needed for congestion. 60 mL 2  . traMADol (ULTRAM) 50 MG tablet TK 1 T PO  Q 6 H PRN  1  . traZODone (DESYREL) 100 MG tablet Take 250 mg by mouth at bedtime.      No current facility-administered medications for this visit.   Family History  Problem Relation Age of Onset  . Heart disease Mother 655 . Hypertension Mother   . Heart disease Father 663 . Aneurysm Brother   . Aneurysm Sister   . Colon cancer Neg Hx   . Esophageal cancer Neg Hx   . Stomach cancer Neg Hx    Social History   Social History  . Marital Status: Divorced    Spouse Name: N/A  . Number of Children: 1  . Years of Education: 12   Occupational History  .  Unemployed   Social History Main Topics  . Smoking status: Current Every Day Smoker -- 0.40 packs/day for 20 years    Types: Cigarettes  . Smokeless tobacco: Current User    Types: Chew     Comment: Nicorette gum.  .Marland KitchenAlcohol Use: No  . Drug Use: No  . Sexual Activity: Not Currently    Birth Control/ Protection: Surgical   Other Topics Concern  . None   Social History Narrative    Review of Systems: Review of Systems  Constitutional: Negative for fever and chills.  HENT: Negative for ear pain.   Eyes: Negative for blurred vision  and pain.  Respiratory: Negative for cough, shortness of breath and wheezing.   Cardiovascular: Negative for chest pain, palpitations and leg swelling.  Gastrointestinal: Negative for nausea, vomiting, abdominal pain, diarrhea and constipation.  Genitourinary: Negative for dysuria, urgency and frequency.  Musculoskeletal: Negative for myalgias.  Skin: Negative for itching and rash.  Neurological: Negative for dizziness, sensory change, focal weakness and headaches.   Objective:  Physical Exam: Filed Vitals:   10/06/15 0911  BP: 155/84  Pulse: 72  Temp: 97.8 F (36.6 C)  TempSrc: Oral  Weight: 185 lb 12.8 oz (84.278 kg)  SpO2: 100%   Physical Exam  Constitutional: She is oriented to person, place, and time. She appears well-developed and well-nourished. No distress.  HENT:  Head: Normocephalic and atraumatic.  Eyes: EOM are normal. Pupils are equal, round, and reactive to light.  Neck: Neck supple. No tracheal deviation present.  Cardiovascular: Normal rate, regular rhythm and intact distal pulses.   Pulmonary/Chest: Effort normal and breath sounds normal. No respiratory distress. She has no wheezes. She has no rales.  Abdominal: Soft. Bowel sounds are normal. She exhibits no distension. There is no tenderness.  Musculoskeletal: She exhibits no edema.  Neurological: She is alert and oriented to person, place, and time.  Skin: Skin is warm and dry.    Assessment & Plan:

## 2015-10-07 NOTE — Progress Notes (Signed)
Internal Medicine Clinic Attending  I saw and evaluated the patient.  I personally confirmed the key portions of the history and exam documented by Dr. Rathore and I reviewed pertinent patient test results.  The assessment, diagnosis, and plan were formulated together and I agree with the documentation in the resident's note.  

## 2015-10-10 ENCOUNTER — Other Ambulatory Visit: Payer: Self-pay | Admitting: Internal Medicine

## 2015-10-15 ENCOUNTER — Other Ambulatory Visit: Payer: Self-pay

## 2015-10-15 DIAGNOSIS — Z79899 Other long term (current) drug therapy: Secondary | ICD-10-CM

## 2015-10-15 DIAGNOSIS — E0821 Diabetes mellitus due to underlying condition with diabetic nephropathy: Secondary | ICD-10-CM

## 2015-10-15 NOTE — Patient Outreach (Signed)
[place] [facility]) Care Management  10/15/2015  Susan Holmes 1956-06-03 [id number]   Subjective:  Telephone call to patient.   HIPAA verified.   Discussed [facility] Care Management services and patient agreed to receive services.   Patient states she is feeling ok today and her blood sugar was  336 this morning.   States her blood sugar range is from 100's to 500's.   She was diagnosed with diabetes approximately 10 years ago.   States her "blood sugars have been off the charts" and only takes blood sugar twice a day.  Patient confirmed her primary MD is Dr. Shela Leff and she last saw her on  09/29/15.  Patient states she has no issues with transportation or obtaining medication.   Patient states she uses the public bus transportation or taxi if needed.    Patient states she had her flu shot on 103/16.  Patient was unsure of A1C and name of insulin.    Objective:  Per Epic lab values blood glucose was 503 on  09/29/15.    Assessment:  MD referral for uncontrolled diabetes.  Per MD Epic note,  MD had requested that patient take blood sugars 3 times a day.     Plan:  RNCM will refer patient to [facility] for diabetes management and education.    RNCM will refer patient to  Pharmacy for polypharmacy.    [name] RN,BSN,CCM [place] Coordinator [phone]

## 2015-10-15 NOTE — Patient Outreach (Signed)
[facility]) Care Management  10/15/2015  Susan Holmes [birth year] [id number]   Request from [name], RN to assign Pharmacy and Community RN, assigned Deanne Coffer, PharmD and Raina Mina, RN.  ,Thanks, [name]. [place], [place] Assistant Phone: [phone] Fax: [phone]

## 2015-10-17 ENCOUNTER — Other Ambulatory Visit: Payer: Self-pay | Admitting: Pharmacist

## 2015-10-17 NOTE — Patient Outreach (Signed)
[facility]) Care Management  [place]   10/17/2015  Susan Holmes 03/06/1956 [id number]  Subjective: Susan Holmes is a 59 y.o. female who was referred to [place] for medication review.   Objective:   Current Medications: Current Outpatient Prescriptions  Medication Sig Dispense Refill  . ACCU-CHEK FASTCLIX LANCETS MISC Check blood sugar 3x a day as instructed dx cod 250.00 insulin requiring 102 each 5  . acetaZOLAMIDE (DIAMOX) 500 MG capsule     . albuterol-ipratropium (COMBIVENT) 18-103 MCG/ACT inhaler Inhale 2 puffs into the lungs every 4 (four) hours as needed for wheezing or shortness of breath.    . Alcohol Swabs (ALCOHOL PREP) 70 % PADS     . amLODipine (NORVASC) 5 MG tablet TAKE 1 TABLET(5 MG) BY MOUTH DAILY 90 tablet 3  . aspirin 81 MG EC tablet TAKE 1 TABLET BY MOUTH EVERY DAY 30 tablet 0  . Blood Glucose Calibration (ACCU-CHEK SMARTVIEW CONTROL) LIQD     . Blood Glucose Monitoring Suppl (ACCU-CHEK AVIVA PLUS) W/DEVICE KIT Use to check blood sugar 3 to 4 times daily. diag code 250.62. Insulin dependent 1 kit 0  . brimonidine (ALPHAGAN) 0.2 % ophthalmic solution Place 1 drop into both eyes 2 (two) times daily.    . citalopram (CELEXA) 20 MG tablet TK 1 T PO QHS  2  . clonazePAM (KLONOPIN) 0.5 MG tablet     . cromolyn (OPTICROM) 4 % ophthalmic solution INT 1 GTT INTO OU QID  0  . cyclopentolate (CYCLODRYL,CYCLOGYL) 1 % ophthalmic solution INT 1 GTT IN OU QID  2  . diclofenac (VOLTAREN) 75 MG EC tablet Take 1 tablet (75 mg total) by mouth 2 (two) times daily as needed for moderate pain (with food). 180 tablet 0  . docusate sodium (COLACE) 100 MG capsule Take 1 capsule (100 mg total) by mouth 2 (two) times daily. (Patient not taking: Reported on 08/05/2015) 30 capsule 0  . dorzolamide-timolol (COSOPT) 22.3-6.8 MG/ML ophthalmic solution Place 1 drop into both eyes 2 (two) times daily.    . DULoxetine (CYMBALTA) 30 MG capsule Take 1 capsule (30  mg total) by mouth daily. 30 capsule 0  . fluconazole (DIFLUCAN) 150 MG tablet Take 1 tablet (150 mg total) by mouth daily. 1 tablet 0  . fluorometholone (FML) 0.1 % ophthalmic suspension     . glipiZIDE (GLUCOTROL) 10 MG tablet TAKE 1 TABLET BY MOUTH DAILY BEFORE BREAKFAST 30 tablet 3  . glucose blood (ACCU-CHEK AVIVA PLUS) test strip Use to test blood sugar 3 times daily. diag code E11.41. Insulin dependent 100 each 11  . ILEVRO 0.3 % ophthalmic suspension   1  . Insulin Glargine (LANTUS SOLOSTAR) 100 UNIT/ML Solostar Pen Inject 40 Units into the skin at bedtime. 15 mL 3  . insulin lispro (HUMALOG KWIKPEN) 100 UNIT/ML KiwkPen Inject 8 units with lunch and 12 units with dinner. Eat meals within 10 minutes of injection. 15 mL 3  . Insulin Pen Needle (VALUMARK PEN NEEDLES) 31G X 8 MM MISC 1 each by Does not apply route daily. 100 each 3  . Lancet Devices (ADJUSTABLE LANCING DEVICE) MISC     . latanoprost (XALATAN) 0.005 % ophthalmic solution Place 1 drop into both eyes 2 (two) times daily.    Marland Kitchen loratadine (CLARITIN) 10 MG tablet Take 1 tablet (10 mg total) by mouth daily. 30 tablet 2  . losartan (COZAAR) 50 MG tablet Take 1 tablet (50 mg total) by mouth 2 (two) times  daily. 180 tablet 1  . LOTEMAX 0.5 % GEL   1  . magnesium citrate SOLN Take 296 mLs (1 Bottle total) by mouth once. 195 mL 0  . metFORMIN (GLUCOPHAGE) 1000 MG tablet Take 1073m (1 tablets) 2 times a day. 60 tablet 11  . mirtazapine (REMERON) 30 MG tablet     . Multiple Vitamin (MULTIVITAMIN WITH MINERALS) TABS tablet Take 1 tablet by mouth daily.    . nicotine polacrilex (NICORETTE) 4 MG gum Take 1 each (4 mg total) by mouth as needed for smoking cessation. 100 tablet 0  . ofloxacin (OCUFLOX) 0.3 % ophthalmic solution INSTILL 1 DROP INTO SURGICAL EYE FOUR TIMES DAILY. START THE SUNDAY BEFORE SURGERY  1  . pantoprazole (PROTONIX) 40 MG tablet Take 1 tablet (40 mg total) by mouth daily. (Patient not taking: Reported on 04/21/2015) 30  tablet 0  . polyethylene glycol (MIRALAX / GLYCOLAX) packet Take 17 g by mouth daily. 14 each 0  . prednisoLONE acetate (PRED FORTE) 1 % ophthalmic suspension Place 1 drop into both eyes 2 (two) times daily.    . rosuvastatin (CRESTOR) 20 MG tablet Take 1 tablet (20 mg total) by mouth at bedtime. 30 tablet 11  . sodium chloride (OCEAN) 0.65 % SOLN nasal spray Place 2 sprays into both nostrils as needed for congestion. 60 mL 2  . traMADol (ULTRAM) 50 MG tablet TK 1 T PO  Q 6 H PRN  1  . traZODone (DESYREL) 100 MG tablet Take 250 mg by mouth at bedtime.      No current facility-administered medications for this visit.    Functional Status: In your present state of health, do you have any difficulty performing the following activities: 09/29/2015 08/21/2015  Hearing? N N  Vision? Y N  Difficulty concentrating or making decisions? N N  Walking or climbing stairs? Y N  Dressing or bathing? N Y  Doing errands, shopping? N N    Fall/Depression Screening: PHQ 2/9 Scores 10/15/2015 10/06/2015 09/29/2015 08/21/2015 08/05/2015 07/04/2015 06/13/2015  PHQ - 2 Score 1 0 0 0 0 0 0  PHQ- 9 Score - - - - - - -  Exception Documentation - - - - - Other- indicate reason in comment box Other- indicate reason in comment box    Assessment: 1. Medication review  Drugs sorted by system:  Neurologic/Psychologic: citalopram, clonazepam, duloxetine, mirtazapine, trazodone  Cardiovascular: amlodipine, aspirin, losartan, rosuvastatin  Pulmonary/Allergy: albuterol-ipratropium, loratadine, sodium chloride  Gastrointestinal:  polyethylene glycol  Endocrine: glipizide, Lantus, Humalog, metformin  Renal: none noted  Topical: Lotemax  Eye: brimonidine, cromolyn, cyclopentolate, dorzolamide-timolol, fluorometholone, Ilevro, latanoprost, ofloxacin, prednisolone  Alpha-2 Agonist: brimonidine  Antibiotic: ofloxacin  Anticholinergic agent: cyclopentolate  Beta blocker: timolol  Carbonic anhydrase inhibitor:  dorzolamide, PO acetazolamide  Corticosteroid: fluorometholone, prednisolone  Mast cell stabilizer: cromolyn  Nonsteroidal anti-inflammatory drug: nepafenac (Ilevro)  Prostaglandin: latanoprost  Pain: diclofenac, tramadol  Vitamins/Minerals: MVI  Infectious Diseases: fluconazole  Miscellaneous: nicotine gum   Duplications in therapy: two opthalmic corticosteroids. Patient on citalopram and duloxetine but duloxetine is being used for pain.   Gaps in therapy: patient on statin, aspirin, and ARB. No indication for beta blocker (no CAD or LVSD)  Medications to avoid in the elderly: patient <65  Drug interactions: none noted  Other issues noted: none noted  Plan: 1. Medication review: no recommendations for any changes -the patient is only taking one of the opthalmic corticosteroids. All other medications are appropriate based on problem list - difficult to control insomnia  and pain.   Nicoletta Ba, PharmD, [facility] [phone]

## 2015-10-27 ENCOUNTER — Ambulatory Visit (INDEPENDENT_AMBULATORY_CARE_PROVIDER_SITE_OTHER): Payer: Medicare Other | Admitting: Pharmacist

## 2015-10-27 DIAGNOSIS — Z7189 Other specified counseling: Secondary | ICD-10-CM | POA: Diagnosis not present

## 2015-10-27 DIAGNOSIS — Z719 Counseling, unspecified: Secondary | ICD-10-CM

## 2015-10-27 DIAGNOSIS — E1165 Type 2 diabetes mellitus with hyperglycemia: Secondary | ICD-10-CM | POA: Diagnosis not present

## 2015-10-27 DIAGNOSIS — Z794 Long term (current) use of insulin: Secondary | ICD-10-CM | POA: Diagnosis not present

## 2015-10-27 DIAGNOSIS — Z79899 Other long term (current) drug therapy: Secondary | ICD-10-CM

## 2015-10-27 NOTE — Patient Instructions (Signed)
Increase Lantus to 45 units daily Continue Humalog 12 units with meals Please check your blood glucose twice daily  Continue to take your medications as prescribed   We will be working with different pharmacies to find a way to help you with your medications

## 2015-10-28 NOTE — Progress Notes (Signed)
S:    Patient arrives with a flat affect.    Presents for medication adherence review   Patient reports adherence with medications but pts refill dates on her rx bottles are outdated.  Pt presented with the following medications to clinic and states these are all of the medications that she currently has: Citalopram Mirtazapine Trazodone Clonazepam Metformin Duloxetine Diclofenac Amlodipine Mega Red Krill Oil  Patient denies hypoglycemic events and reports knowing how to treat episodes.   Pt states monitors SMBG BID.   Pt states values are 300-500s.  Pt states lowest BG was in 200s  Pt reports only taking Humalog 12-14 units at dinner as she only eats one meal per day.   O:  . Lab Results  Component Value Date   HGBA1C >14.0 09/29/2015    A/P: Diabetes currently uncontrolled.   denies hypoglycemic events and is able to verbalize appropriate hypoglycemia management plan.  Control is suboptimal due to adherence. Increased dose of basal insulin Lantus (insulin glargine) to 45 units daily. Continued rapid insulin Novolog (insulin aspart) at 12 units with meals (pt only eats one meal/day).       Adherence: Pt reports adherence but refill dates are several months behind and pt only brings in a portion of the medications on her medication list and does not have the other medications in her possession.  Discussed switching pts pharmacy to a local pharmacy that will do pill pack fills or switching her to a mail order pharmacy that will sync her refills and lower her copays.  Pt expressed concern about the copays of her medications.  Pharmacy will plan to f/u before next appointment with Dr Marlowe Sax.   Written patient instructions provided.  Follow up in IM Clinic Visit 11/22.   Total time in face to face counseling 45 minutes.  Patient seen with Mannie Stabile, PharmD.

## 2015-10-30 NOTE — Progress Notes (Signed)
Patient was seen in clinic by Bennye Alm, PharmD, PGY1 pharmacy resident. I agree with the assessment and plan of care documented.  Patient was also flagged by Geriatrics Task Force and assigned to team members to contact pharmacies for more med history information.

## 2015-10-31 ENCOUNTER — Other Ambulatory Visit: Payer: Self-pay | Admitting: Internal Medicine

## 2015-11-02 ENCOUNTER — Encounter (HOSPITAL_COMMUNITY): Payer: Self-pay | Admitting: Nurse Practitioner

## 2015-11-02 ENCOUNTER — Emergency Department (HOSPITAL_COMMUNITY)
Admission: EM | Admit: 2015-11-02 | Discharge: 2015-11-02 | Disposition: A | Discharge status: Home / Self Care | Payer: Medicare Other | Attending: Emergency Medicine | Admitting: Emergency Medicine

## 2015-11-02 DIAGNOSIS — M542 Cervicalgia: Secondary | ICD-10-CM | POA: Diagnosis not present

## 2015-11-02 DIAGNOSIS — H578 Other specified disorders of eye and adnexa: Secondary | ICD-10-CM | POA: Diagnosis not present

## 2015-11-02 DIAGNOSIS — H5442 Blindness, left eye, normal vision right eye: Secondary | ICD-10-CM | POA: Diagnosis not present

## 2015-11-02 DIAGNOSIS — E119 Type 2 diabetes mellitus without complications: Secondary | ICD-10-CM | POA: Diagnosis not present

## 2015-11-02 DIAGNOSIS — Z7984 Long term (current) use of oral hypoglycemic drugs: Secondary | ICD-10-CM | POA: Diagnosis not present

## 2015-11-02 DIAGNOSIS — Z9889 Other specified postprocedural states: Secondary | ICD-10-CM | POA: Diagnosis not present

## 2015-11-02 DIAGNOSIS — Z794 Long term (current) use of insulin: Secondary | ICD-10-CM | POA: Diagnosis not present

## 2015-11-02 DIAGNOSIS — E785 Hyperlipidemia, unspecified: Secondary | ICD-10-CM | POA: Diagnosis not present

## 2015-11-02 DIAGNOSIS — Z8619 Personal history of other infectious and parasitic diseases: Secondary | ICD-10-CM | POA: Insufficient documentation

## 2015-11-02 DIAGNOSIS — R51 Headache: Secondary | ICD-10-CM | POA: Diagnosis not present

## 2015-11-02 DIAGNOSIS — M79606 Pain in leg, unspecified: Secondary | ICD-10-CM | POA: Insufficient documentation

## 2015-11-02 DIAGNOSIS — Z79899 Other long term (current) drug therapy: Secondary | ICD-10-CM | POA: Diagnosis not present

## 2015-11-02 DIAGNOSIS — Z7982 Long term (current) use of aspirin: Secondary | ICD-10-CM | POA: Diagnosis not present

## 2015-11-02 DIAGNOSIS — F419 Anxiety disorder, unspecified: Secondary | ICD-10-CM | POA: Insufficient documentation

## 2015-11-02 DIAGNOSIS — Z720 Tobacco use: Secondary | ICD-10-CM | POA: Insufficient documentation

## 2015-11-02 DIAGNOSIS — I10 Essential (primary) hypertension: Secondary | ICD-10-CM | POA: Insufficient documentation

## 2015-11-02 DIAGNOSIS — Z7952 Long term (current) use of systemic steroids: Secondary | ICD-10-CM | POA: Diagnosis not present

## 2015-11-02 DIAGNOSIS — F329 Major depressive disorder, single episode, unspecified: Secondary | ICD-10-CM | POA: Diagnosis not present

## 2015-11-02 DIAGNOSIS — H409 Unspecified glaucoma: Secondary | ICD-10-CM | POA: Diagnosis not present

## 2015-11-02 DIAGNOSIS — M1990 Unspecified osteoarthritis, unspecified site: Secondary | ICD-10-CM | POA: Diagnosis not present

## 2015-11-02 MED ORDER — DIAZEPAM 5 MG PO TABS
5.0000 mg | ORAL_TABLET | Freq: Once | ORAL | Status: AC
  Administered 2015-11-02: 5 mg via ORAL
  Filled 2015-11-02: qty 1

## 2015-11-02 MED ORDER — IBUPROFEN 800 MG PO TABS
800.0000 mg | ORAL_TABLET | Freq: Once | ORAL | Status: AC
  Administered 2015-11-02: 800 mg via ORAL
  Filled 2015-11-02: qty 1

## 2015-11-02 MED ORDER — TIZANIDINE HCL 2 MG PO TABS: 2.0000 mg | ORAL_TABLET | Freq: Four times a day (QID) | ORAL | Status: DC | PRN

## 2015-11-02 NOTE — ED Provider Notes (Signed)
CSN: [id number]     Arrival date & time 11/02/15  1705 History   First MD Initiated Contact with Patient 11/02/15 1755     Chief Complaint  Patient presents with  . Neck Pain     (Consider location/radiation/quality/duration/timing/severity/associated sxs/prior Treatment) HPI Susan Holmes is a 59 y.o. female with a history of diabetes, hypertension, comes in for evaluation of leg pain. Patient reports this morning at approximately 3:00 AM she woke up with left-sided neck pain. She really thought she slept on it funny, but the pain is not resolved other day. She has not taken anything to improve her symptoms. She reports associated mild left-sided headache, similar to previous. She is not taking anything for this either. She also reports associated left eye redness. Reports redness started on Friday, clear drainage, steadily improving. Denies any fevers, chills, sore throat, nausea or vomiting, abdominal pain, chest pain, shortness of breath, numbness or weakness, other vision changes.  Past Medical History  Diagnosis Date  . Hepatitis B infection pt unsure    resolved  . Diabetes mellitus, type II ([place])   . HTN (hypertension)   . Hyperlipidemia   . Congenital blindness     L eye  . Tobacco abuse     .5 PPD smoker  . Anxiety   . Depression   . Arthritis   . Abnormal stress test   . Glaucoma    Past Surgical History  Procedure Laterality Date  . Abdominal hysterectomy    . Left heart catheterization with coronary angiogram N/A 10/09/2014    Procedure: LEFT HEART CATHETERIZATION WITH CORONARY ANGIOGRAM;  Surgeon: Troy Sine, MD;  Location: [facility] CATH LAB;  Service: Cardiovascular;  Laterality: N/A;   Family History  Problem Relation Age of Onset  . Heart disease Mother 7  . Hypertension Mother   . Heart disease Father 60  . Aneurysm Brother   . Aneurysm Sister   . Colon cancer Neg Hx   . Esophageal cancer Neg Hx   . Stomach cancer Neg Hx    Social History  Substance  Use Topics  . Smoking status: Current Every Day Smoker -- 0.40 packs/day for 20 years    Types: Cigarettes  . Smokeless tobacco: Current User    Types: Chew     Comment: Nicorette gum.  Marland Kitchen Alcohol Use: No   OB History    No data available     Review of Systems A 10 point review of systems was completed and was negative except for pertinent positives and negatives as mentioned in the history of present illness     Allergies  Ace inhibitors  Home Medications   Prior to Admission medications   Medication Sig Start Date End Date Taking? Authorizing Provider  amLODipine (NORVASC) 5 MG tablet TAKE 1 TABLET(5 MG) BY MOUTH DAILY 10/10/15  Yes Shela Leff, MD  aspirin 81 MG EC tablet TAKE 1 TABLET BY MOUTH EVERY DAY 03/10/15  Yes Hoyt Koch, MD  brimonidine (ALPHAGAN) 0.2 % ophthalmic solution Place 1 drop into both eyes 2 (two) times daily.   Yes Historical Provider, MD  citalopram (CELEXA) 40 MG tablet Take 40 mg by mouth daily. 09/09/15  Yes Historical Provider, MD  clonazePAM (KLONOPIN) 0.5 MG tablet Take 0.5 mg by mouth daily as needed for anxiety.  05/31/15  Yes Historical Provider, MD  diclofenac (VOLTAREN) 75 MG EC tablet Take 1 tablet (75 mg total) by mouth 2 (two) times daily as needed for moderate pain (with  food). 09/29/15  Yes Shela Leff, MD  DULoxetine (CYMBALTA) 30 MG capsule TAKE ONE CAPSULE BY MOUTH DAILY 10/31/15  Yes Shela Leff, MD  Insulin Glargine (LANTUS SOLOSTAR) 100 UNIT/ML Solostar Pen Inject 40 Units into the skin at bedtime. 09/29/15  Yes Shela Leff, MD  insulin lispro (HUMALOG KWIKPEN) 100 UNIT/ML KiwkPen Inject 8 units with lunch and 12 units with dinner. Eat meals within 10 minutes of injection. Patient taking differently: Inject 14 Units into the skin daily.  10/06/15  Yes Shela Leff, MD  loratadine (CLARITIN) 10 MG tablet Take 1 tablet (10 mg total) by mouth daily. 04/21/15  Yes Nino Glow McLean-Scocozza, MD  losartan (COZAAR)  50 MG tablet Take 1 tablet (50 mg total) by mouth 2 (two) times daily. 03/31/15  Yes Lucious Groves, DO  metFORMIN (GLUCOPHAGE) 1000 MG tablet Take 1031m (1 tablets) 2 times a day. 02/26/15  Yes KOtho Bellows MD  mirtazapine (REMERON) 30 MG tablet  04/02/15  Yes Historical Provider, MD  nicotine polacrilex (NICORETTE) 4 MG gum Take 1 each (4 mg total) by mouth as needed for smoking cessation. 08/15/14  Yes JKarlene Einstein MD  polyethylene glycol (MIRALAX / GLYCOLAX) packet Take 17 g by mouth daily. 04/21/15  Yes TNino GlowMcLean-Scocozza, MD  rosuvastatin (CRESTOR) 20 MG tablet Take 1 tablet (20 mg total) by mouth at bedtime. 03/20/15 03/19/16 Yes JKarlene Einstein MD  traZODone (DESYREL) 100 MG tablet Take 200 mg by mouth at bedtime.    Yes Historical Provider, MD  ACCU-CHEK FASTCLIX LANCETS MISC Check blood sugar 3x a day as instructed dx cod 250.00 insulin requiring 12/04/14   KOtho Bellows MD  Alcohol Swabs (ALCOHOL PREP) 70 % PADS  05/30/15   Historical Provider, MD  Blood Glucose Calibration (ACCU-CHEK SMARTVIEW CONTROL) LIQD  05/30/15   Historical Provider, MD  Blood Glucose Monitoring Suppl (ACCU-CHEK AVIVA PLUS) W/DEVICE KIT Use to check blood sugar 3 to 4 times daily. diag code 250.62. Insulin dependent 09/11/14   Nischal Narendra, MD  fluconazole (DIFLUCAN) 150 MG tablet Take 1 tablet (150 mg total) by mouth daily. Patient not taking: Reported on 10/27/2015 09/29/15   VShela Leff MD  glipiZIDE (GLUCOTROL) 10 MG tablet TAKE 1 TABLET BY MOUTH DAILY BEFORE BREAKFAST Patient not taking: Reported on 10/27/2015 04/25/15   JKarlene Einstein MD  glucose blood (ACCU-CHEK AVIVA PLUS) test strip Use to test blood sugar 3 times daily. diag code E11.41. Insulin dependent Patient not taking: Reported on 10/28/2015 12/11/14   KOtho Bellows MD  Insulin Pen Needle (VALUMARK PEN NEEDLES) 31G X 8 MM MISC 1 each by Does not apply route daily. 11/20/14   AFrancesca Oman DO  Lancet Devices (ADJUSTABLE LANCING  DEVICE) [place] 03/17/15   Historical Provider, MD  magnesium citrate SOLN Take 296 mLs (1 Bottle total) by mouth once. Patient not taking: Reported on 10/28/2015 06/04/15   JKarlene Einstein MD  prednisoLONE acetate (PRED FORTE) 1 % ophthalmic suspension Place 1 drop into both eyes 2 (two) times daily.    Historical Provider, MD  sodium chloride (OCEAN) 0.65 % SOLN nasal spray Place 2 sprays into both nostrils as needed for congestion. Patient not taking: Reported on 10/28/2015 04/21/15   TNino GlowMcLean-Scocozza, MD  tiZANidine (ZANAFLEX) 2 MG tablet Take 1 tablet (2 mg total) by mouth every 6 (six) hours as needed for muscle spasms. 11/02/15   BComer Locket PA-C   BP 144/69 mmHg  Pulse 87  Temp(Src)  98.1 F (36.7 C) (Oral)  Resp 21  Ht '5\' 7"'  (1.702 m)  Wt 178 lb (80.74 kg)  BMI 27.87 kg/m2  SpO2 97% Physical Exam  Constitutional: She is oriented to person, place, and time. She appears well-developed and well-nourished.  African-American female  HENT:  Head: Normocephalic and atraumatic.  Mouth/Throat: Oropharynx is clear and moist.  Eyes: Conjunctivae are normal. Pupils are equal, round, and reactive to light. Right eye exhibits no discharge. Left eye exhibits no discharge. No scleral icterus.  Neck: Normal range of motion. Neck supple.  Diffuse tenderness throughout the left scalenes and sternocleidomastoid and trapezius. No carotid bruit. No erythema or swelling. No lymphadenopathy. No meningismus or nuchal rigidity.  Cardiovascular: Normal rate, regular rhythm and normal heart sounds.   Pulmonary/Chest: Effort normal and breath sounds normal. No respiratory distress. She has no wheezes. She has no rales.  Abdominal: Soft. There is no tenderness.  Musculoskeletal: She exhibits no tenderness.  Neurological: She is alert and oriented to person, place, and time.  Cranial Nerves II-XII grossly intact  Skin: Skin is warm and dry. No rash noted.  Psychiatric: She has a normal mood and  affect.  Nursing note and vitals reviewed.   ED Course  Procedures (including critical care time) Labs Review Labs Reviewed - No data to display  Imaging Review No results found. I have personally reviewed and evaluated these images and lab results as part of my medical decision-making.   EKG Interpretation   Date/Time:  Sunday November 02 2015 20:09:14 EST Ventricular Rate:  79 PR Interval:  113 QRS Duration: 105 QT Interval:  393 QTC Calculation: 450 R Axis:   27 Text Interpretation:  Sinus rhythm Ventricular premature complex  Nonspecific TW abnormality, in different leads from prior No significant  change since last tracing Confirmed by [facility] MD, ERIN ([id number]) on  11/02/2015 8:35:14 PM     Meds given in ED:  Medications  ibuprofen (ADVIL,MOTRIN) tablet 800 mg (800 mg Oral Given 11/02/15 1838)  diazepam (VALIUM) tablet 5 mg (5 mg Oral Given 11/02/15 1838)    New Prescriptions   TIZANIDINE (ZANAFLEX) 2 MG TABLET    Take 1 tablet (2 mg total) by mouth every 6 (six) hours as needed for muscle spasms.   Filed Vitals:   11/02/15 1716 11/02/15 1837 11/02/15 1913 11/02/15 2053  BP: 161/92  146/92 144/69  Pulse: 99  86 87  Temp: 97.4 F (36.3 C)  98.1 F (36.7 C)   TempSrc: Oral  Oral   Resp: '20  18 21  ' Height:  '5\' 7"'  (5.053 m)    Weight:  178 lb (80.74 kg)    SpO2: 99%  95% 97%    MDM  Vitals stable - WNL -afebrile Pt resting comfortably in ED. PE--tenderness diffusely throughout left scalene and trapezius muscles. No carotid bruits. Nonfocal neuro exam. Physical exam otherwise not concerning. EKG is unremarkable.  DDX--neck pain likely secondary to musculoskeletal strain. Discussed using Motrin at home. We will prescribe short course muscle relaxer. Also discussed follow-up with PCP within the week for reevaluation as needed. No evidence of other acute or emergent pathology at this time.  I discussed all relevant lab findings and imaging results with pt and  they verbalized understanding. Discussed f/u with PCP within 48 hrs and return precautions, pt very amenable to plan. Prior to patient discharge, I discussed and reviewed this case with Dr. Billy Fischer who also saw and evaluated patient and agrees with above plan.   Final  diagnoses:  Musculoskeletal neck pain       Comer Locket, PA-C 11/03/15 1281  Gareth Morgan, MD 11/03/15 1304

## 2015-11-02 NOTE — ED Notes (Signed)
DIET GINGER ALE GIVEN

## 2015-11-02 NOTE — ED Notes (Signed)
Pt c/o of left sided neck pain with mild swelling on the same side, says she noticed these on Friday as has worsened since then, also states her left eyes has a problem and on inspection there some noticeable inflammation on the said eye.

## 2015-11-02 NOTE — ED Notes (Signed)
ED PA at bedside

## 2015-11-02 NOTE — Discharge Instructions (Signed)
There does not appear to be an emergent cause for your symptoms at this time. Please take your medications as prescribed. You may also use warm compresses and ibuprofen for your discomfort. Please follow-up with your doctor in 1 week for reevaluation. Return to ED for worsening symptoms.  Musculoskeletal Pain Musculoskeletal pain is muscle and boney aches and pains. These pains can occur in any part of the body. Your caregiver may treat you without knowing the cause of the pain. They may treat you if blood or urine tests, X-rays, and other tests were normal.  CAUSES There is often not a definite cause or reason for these pains. These pains may be caused by a type of germ (virus). The discomfort may also come from overuse. Overuse includes working out too hard when your body is not fit. Boney aches also come from weather changes. Bone is sensitive to atmospheric pressure changes. HOME CARE INSTRUCTIONS   Ask when your test results will be ready. Make sure you get your test results.  Only take over-the-counter or prescription medicines for pain, discomfort, or fever as directed by your caregiver. If you were given medications for your condition, do not drive, operate machinery or power tools, or sign legal documents for 24 hours. Do not drink alcohol. Do not take sleeping pills or other medications that may interfere with treatment.  Continue all activities unless the activities cause more pain. When the pain lessens, slowly resume normal activities. Gradually increase the intensity and duration of the activities or exercise.  During periods of severe pain, bed rest may be helpful. Lay or sit in any position that is comfortable.  Putting ice on the injured area.  Put ice in a bag.  Place a towel between your skin and the bag.  Leave the ice on for 15 to 20 minutes, 3 to 4 times a day.  Follow up with your caregiver for continued problems and no reason can be found for the pain. If the pain  becomes worse or does not go away, it may be necessary to repeat tests or do additional testing. Your caregiver may need to look further for a possible cause. SEEK IMMEDIATE MEDICAL CARE IF:  You have pain that is getting worse and is not relieved by medications.  You develop chest pain that is associated with shortness or breath, sweating, feeling sick to your stomach (nauseous), or throw up (vomit).  Your pain becomes localized to the abdomen.  You develop any new symptoms that seem different or that concern you. MAKE SURE YOU:   Understand these instructions.  Will watch your condition.  Will get help right away if you are not doing well or get worse.   This information is not intended to replace advice given to you by your health care provider. Make sure you discuss any questions you have with your health care provider.   Document Released: 12/13/2005 Document Revised: 03/06/2012 Document Reviewed: 08/17/2013 Elsevier Interactive Patient Education [employer].

## 2015-11-06 ENCOUNTER — Ambulatory Visit (INDEPENDENT_AMBULATORY_CARE_PROVIDER_SITE_OTHER): Payer: Medicare Other | Admitting: Internal Medicine

## 2015-11-06 ENCOUNTER — Encounter: Payer: Self-pay | Admitting: Internal Medicine

## 2015-11-06 VITALS — BP 134/83 | HR 93 | Temp 98.7°F | Ht 67.0 in | Wt 179.7 lb

## 2015-11-06 DIAGNOSIS — E1149 Type 2 diabetes mellitus with other diabetic neurological complication: Secondary | ICD-10-CM | POA: Diagnosis not present

## 2015-11-06 DIAGNOSIS — IMO0002 Reserved for concepts with insufficient information to code with codable children: Secondary | ICD-10-CM

## 2015-11-06 DIAGNOSIS — M542 Cervicalgia: Secondary | ICD-10-CM | POA: Diagnosis not present

## 2015-11-06 DIAGNOSIS — E1165 Type 2 diabetes mellitus with hyperglycemia: Secondary | ICD-10-CM

## 2015-11-06 LAB — GLUCOSE, CAPILLARY: Glucose-Capillary: 348 mg/dL — ABNORMAL HIGH (ref 65–99)

## 2015-11-06 MED ORDER — MELOXICAM 7.5 MG PO TABS
7.5000 mg | ORAL_TABLET | Freq: Every day | ORAL | Status: AC
Start: 2015-11-06 — End: 2015-11-12

## 2015-11-06 MED ORDER — EXENATIDE 5 MCG/0.02ML ~~LOC~~ SOPN: 5.0000 ug | PEN_INJECTOR | Freq: Two times a day (BID) | SUBCUTANEOUS | Status: DC

## 2015-11-06 NOTE — Progress Notes (Signed)
Patient ID: Susan Holmes, female   DOB: 02-26-1956, 59 y.o.   MRN: [id number]   Subjective:   Patient ID: Susan Holmes female   DOB: 1956-04-19 59 y.o.   MRN: [id number]  HPI: [name] is a 59 y.o. woman with IDDM and neck spasm here to discuss the two issues and other PMH noted below.  Please see Problem List/A&P for the status of the patient's chronic medical problems      Past Medical History  Diagnosis Date  . Hepatitis B infection pt unsure    resolved  . Diabetes mellitus, type II ([place])   . HTN (hypertension)   . Hyperlipidemia   . Congenital blindness     L eye  . Tobacco abuse     .5 PPD smoker  . Anxiety   . Depression   . Arthritis   . Abnormal stress test   . Glaucoma    Current Outpatient Prescriptions  Medication Sig Dispense Refill  . ACCU-CHEK FASTCLIX LANCETS MISC Check blood sugar 3x a day as instructed dx cod 250.00 insulin requiring 102 each 5  . Alcohol Swabs (ALCOHOL PREP) 70 % PADS     . amLODipine (NORVASC) 5 MG tablet TAKE 1 TABLET(5 MG) BY MOUTH DAILY 90 tablet 3  . aspirin 81 MG EC tablet TAKE 1 TABLET BY MOUTH EVERY DAY 30 tablet 0  . Blood Glucose Calibration (ACCU-CHEK SMARTVIEW CONTROL) LIQD     . Blood Glucose Monitoring Suppl (ACCU-CHEK AVIVA PLUS) W/DEVICE KIT Use to check blood sugar 3 to 4 times daily. diag code 250.62. Insulin dependent 1 kit 0  . brimonidine (ALPHAGAN) 0.2 % ophthalmic solution Place 1 drop into both eyes 2 (two) times daily.    . citalopram (CELEXA) 40 MG tablet Take 40 mg by mouth daily.  2  . clonazePAM (KLONOPIN) 0.5 MG tablet Take 0.5 mg by mouth daily as needed for anxiety.     . DULoxetine (CYMBALTA) 30 MG capsule TAKE ONE CAPSULE BY MOUTH DAILY 30 capsule 0  . glucose blood (ACCU-CHEK AVIVA PLUS) test strip Use to test blood sugar 3 times daily. diag code E11.41. Insulin dependent 100 each 11  . Insulin Glargine (LANTUS SOLOSTAR) 100 UNIT/ML Solostar Pen Inject 40 Units into the skin at bedtime. 15  mL 3  . insulin lispro (HUMALOG KWIKPEN) 100 UNIT/ML KiwkPen Inject 8 units with lunch and 12 units with dinner. Eat meals within 10 minutes of injection. (Patient taking differently: Inject 14 Units into the skin daily. ) 15 mL 3  . Insulin Pen Needle (VALUMARK PEN NEEDLES) 31G X 8 MM MISC 1 each by Does not apply route daily. 100 each 3  . Lancet Devices (ADJUSTABLE LANCING DEVICE) MISC     . loratadine (CLARITIN) 10 MG tablet Take 1 tablet (10 mg total) by mouth daily. 30 tablet 2  . losartan (COZAAR) 50 MG tablet Take 1 tablet (50 mg total) by mouth 2 (two) times daily. 180 tablet 1  . magnesium citrate SOLN Take 296 mLs (1 Bottle total) by mouth once. 195 mL 0  . metFORMIN (GLUCOPHAGE) 1000 MG tablet Take 1044m (1 tablets) 2 times a day. 60 tablet 11  . mirtazapine (REMERON) 30 MG tablet     . nicotine polacrilex (NICORETTE) 4 MG gum Take 1 each (4 mg total) by mouth as needed for smoking cessation. 100 tablet 0  . polyethylene glycol (MIRALAX / GLYCOLAX) packet Take 17 g by mouth daily. 14 each 0  .  prednisoLONE acetate (PRED FORTE) 1 % ophthalmic suspension Place 1 drop into both eyes 2 (two) times daily.    . rosuvastatin (CRESTOR) 20 MG tablet Take 1 tablet (20 mg total) by mouth at bedtime. 30 tablet 11  . sodium chloride (OCEAN) 0.65 % SOLN nasal spray Place 2 sprays into both nostrils as needed for congestion. 60 mL 2  . tiZANidine (ZANAFLEX) 2 MG tablet Take 1 tablet (2 mg total) by mouth every 6 (six) hours as needed for muscle spasms. 10 tablet 0  . traZODone (DESYREL) 100 MG tablet Take 200 mg by mouth at bedtime.     . diclofenac (VOLTAREN) 75 MG EC tablet Take 1 tablet (75 mg total) by mouth 2 (two) times daily as needed for moderate pain (with food). 180 tablet 0  . exenatide (BYETTA 5 MCG PEN) 5 MCG/0.02ML SOPN injection Inject 0.02 mLs (5 mcg total) into the skin 2 (two) times daily with a meal. 1.2 mL 3  . fluconazole (DIFLUCAN) 150 MG tablet Take 1 tablet (150 mg total) by  mouth daily. (Patient not taking: Reported on 10/27/2015) 1 tablet 0  . glipiZIDE (GLUCOTROL) 10 MG tablet TAKE 1 TABLET BY MOUTH DAILY BEFORE BREAKFAST (Patient not taking: Reported on 10/27/2015) 30 tablet 3  . meloxicam (MOBIC) 7.5 MG tablet Take 1 tablet (7.5 mg total) by mouth daily. 7 tablet 0   No current facility-administered medications for this visit.   Family History  Problem Relation Age of Onset  . Heart disease Mother 67  . Hypertension Mother   . Heart disease Father 3  . Aneurysm Brother   . Aneurysm Sister   . Colon cancer Neg Hx   . Esophageal cancer Neg Hx   . Stomach cancer Neg Hx    Social History   Social History  . Marital Status: Divorced    Spouse Name: N/A  . Number of Children: 1  . Years of Education: 12   Occupational History  .  Unemployed   Social History Main Topics  . Smoking status: Current Every Day Smoker -- 0.40 packs/day for 20 years    Types: Cigarettes  . Smokeless tobacco: Current User    Types: Chew     Comment: Nicorette gum.  Marland Kitchen Alcohol Use: No  . Drug Use: No  . Sexual Activity: Not Currently    Birth Control/ Protection: Surgical   Other Topics Concern  . None   Social History Narrative   Review of Systems: Review of Systems  Constitutional: Negative.  Negative for fever, chills, weight loss and malaise/fatigue.  HENT: Negative.   Cardiovascular: Negative.  Negative for chest pain and palpitations.  Gastrointestinal: Negative.   Musculoskeletal: Positive for joint pain.  Neurological: Negative for dizziness, tingling and weakness.  Psychiatric/Behavioral: The patient is nervous/anxious.     Objective:  Physical Exam: Filed Vitals:   11/06/15 1324  BP: 134/83  Pulse: 93  Temp: 98.7 F (37.1 C)  TempSrc: Oral  Height: 5' 7" (1.702 m)  Weight: 179 lb 11.2 oz (81.511 kg)  SpO2: 98%   Physical Exam  Constitutional: She appears well-developed and well-nourished.  HENT:  Head: Normocephalic and atraumatic.    Neck: Normal range of motion. Neck supple. No thyromegaly present.  Left scm tender to palpation    Cardiovascular: Normal rate, regular rhythm, normal heart sounds and intact distal pulses.   No murmur heard. Pulmonary/Chest: Effort normal and breath sounds normal. No respiratory distress.  Abdominal: Soft. Bowel sounds are normal. She  exhibits no distension. There is no tenderness. There is no guarding.    Assessment & Plan:  Please see problem based charting for assessment and plan

## 2015-11-06 NOTE — Patient Instructions (Signed)
Thank you for your visit today Please use meloxicam tablet once a day for 7 days Please do not use ibuprofen, advil, motrin along with meloxicam as it can affect kidney function  Please use Byetta injection twice a day for your diabetes BRING ALL MEDS WITH YOU NEXT VISIT

## 2015-11-06 NOTE — Assessment & Plan Note (Signed)
Pt with 2 week history of left sided neck pain--> ER visit on 11/6 and was given tizanidine and advil. Says it did not help.  Says she probably slept in an odd position before it started.  The tizanidine is only helpign somewhat. She described her pain as 7/10, throbbing quality and present all the time.   Exam reveals muscular strain of SCM muscle   Plan Given meloxicam for 7-10 days Heating and cooling packs instructed Instructed to not take advil simultenously

## 2015-11-06 NOTE — Assessment & Plan Note (Signed)
Pt with poorly controlled T2Dm currently on 40 units of lantus and 14 units of Novolog and maximal metformin therapy Her last a1c was 14. Her average cbg at home in 300s with lowest in high 200 and highest in 500s. Pt does not recally what started the steep increase in her a1c  Pt's med list also says glipizide but she is not sure if she takes it or not She also takes 1000 mg metformin bid   Plan Continue 40 unis of lantus and 14 units of novolog along with metformin 1000 bid Prescribed Byetta- bid, dose can be titrated more at future visit This patient needs tighter control of her A1c Instructed to bring med list or bottles at next visit and verify whether she takes glipizide or not

## 2015-11-07 ENCOUNTER — Encounter: Payer: Self-pay | Admitting: *Deleted

## 2015-11-07 ENCOUNTER — Other Ambulatory Visit: Payer: Self-pay | Admitting: *Deleted

## 2015-11-07 NOTE — Patient Outreach (Signed)
[place] [facility]) Care Management  11/07/2015  Susan Holmes 10/13/1956 [id number]   Initial outreach attempt however unsuccessful however RN able to leave a HIPAA approved message requesting a call back to inquire further on possible [facility] assistance. Will continue outreach attempts accordingly.  Raina Mina, RN Care Management Coordinator [facility] [facility] Main Office [phone]

## 2015-11-07 NOTE — Progress Notes (Signed)
Internal Medicine Clinic Attending  I saw and evaluated the patient.  I personally confirmed the key portions of the history and exam documented by Dr. Saraiya and I reviewed pertinent patient test results.  The assessment, diagnosis, and plan were formulated together and I agree with the documentation in the resident's note.  

## 2015-11-10 ENCOUNTER — Other Ambulatory Visit: Payer: Self-pay | Admitting: *Deleted

## 2015-11-10 DIAGNOSIS — E1169 Type 2 diabetes mellitus with other specified complication: Secondary | ICD-10-CM

## 2015-11-10 DIAGNOSIS — E1139 Type 2 diabetes mellitus with other diabetic ophthalmic complication: Secondary | ICD-10-CM

## 2015-11-10 NOTE — Patient Outreach (Signed)
[place] [facility]) Care Management  11/07/2015  KAZLYNN HIBBERD 12-20-1956 [id number]  Rn received a voice message from pt returning RN's call from earlier today. RN will follow up accordingly and inquire further on possible extension of [facility] services for community home visits if applicable.   Raina Mina, RN Care Management Coordinator [place] [facility] Main Office [phone]

## 2015-11-11 ENCOUNTER — Ambulatory Visit: Payer: Medicare Other | Attending: Cardiology | Admitting: Rehabilitation

## 2015-11-11 ENCOUNTER — Encounter: Payer: Self-pay | Admitting: Rehabilitation

## 2015-11-11 DIAGNOSIS — R269 Unspecified abnormalities of gait and mobility: Secondary | ICD-10-CM | POA: Insufficient documentation

## 2015-11-11 DIAGNOSIS — R2689 Other abnormalities of gait and mobility: Secondary | ICD-10-CM

## 2015-11-11 DIAGNOSIS — M25552 Pain in left hip: Secondary | ICD-10-CM | POA: Diagnosis not present

## 2015-11-11 NOTE — Therapy (Signed)
[place] [address] [place], [place], [address] Phone: [phone]   Fax:  [phone]  Physical Therapy Evaluation  Patient Details  Name: Susan Holmes MRN: [id number] Date of Birth: 1956-06-18 Referring Provider: Shela Leff, MD  Encounter Date: 11/11/2015      PT End of Session - 11/11/15 1415    Visit Number 1   Number of Visits 9  eval +8 visits   Date for PT Re-Evaluation 12/11/15   Authorization Type [employer] MDC-Gcodes every 10th visit   PT Start Time 1015   PT Stop Time 1100   PT Time Calculation (min) 45 min   Activity Tolerance Patient tolerated treatment well   Behavior During Therapy [facility] for tasks assessed/performed      Past Medical History  Diagnosis Date  . Hepatitis B infection pt unsure    resolved  . Diabetes mellitus, type II ([place])   . HTN (hypertension)   . Hyperlipidemia   . Congenital blindness     L eye  . Tobacco abuse     .5 PPD smoker  . Anxiety   . Depression   . Arthritis   . Abnormal stress test   . Glaucoma     Past Surgical History  Procedure Laterality Date  . Abdominal hysterectomy    . Left heart catheterization with coronary angiogram N/A 10/09/2014    Procedure: LEFT HEART CATHETERIZATION WITH CORONARY ANGIOGRAM;  Surgeon: Troy Sine, MD;  Location: [facility] CATH LAB;  Service: Cardiovascular;  Laterality: N/A;    There were no vitals filed for this visit.  Visit Diagnosis:  Left hip pain - Plan: PT plan of care cert/re-cert  Antalgic gait - Plan: PT plan of care cert/re-cert      Subjective Assessment - 11/11/15 1022    Subjective "I'm here for L hip pain."  "It gives me pain when I'm walking, esp for more than 5 mins."  "I have pain that shoots down my leg."     Limitations Walking;Standing;Sitting;House hold activities   Currently in Pain? Yes   Pain Score 4    Pain Location Hip   Pain Orientation Left   Pain Descriptors / Indicators Aching    Pain Type Chronic pain   Pain Radiating Towards down L leg   Pain Onset More than a month ago   Pain Frequency Intermittent  states sometimes rest doesn't help   Aggravating Factors  walking long periods, laying on L side   Pain Relieving Factors nothing            [facility] PT Assessment - 11/11/15 0001    Assessment   Medical Diagnosis L hip pain    Referring Provider Shela Leff, MD   Onset Date/Surgical Date 10/27/14  about a year ago per pt report   Precautions   Precautions None   Restrictions   Weight Bearing Restrictions No   Balance Screen   Has the patient fallen in the past 6 months No   Has the patient had a decrease in activity level because of a fear of falling?  Yes   Is the patient reluctant to leave their home because of a fear of falling?  Yes   [place] Private residence   Living Arrangements Alone   Available Help at Discharge Family;Available PRN/intermittently   Type of Home Apartment   Home Access Level entry   Home Layout Two level   Alternate Level Stairs-Number of Steps 13  Alternate Level Stairs-Rails Right   Home Equipment None;Grab bars - tub/shower   Prior Function   Level of Independence Independent   Vocation On disability   Leisure nothing, would like to get back to exercising   Cognition   Overall Cognitive Status Within Functional Limits for tasks assessed  pt with very flat affect   Sensation   Light Touch Appears Intact  states some numbess at night when lying on hip   Proprioception Appears Intact   Coordination   Gross Motor Movements are Fluid and Coordinated Yes   Fine Motor Movements are Fluid and Coordinated Yes   Heel Shin Test [facility]   ROM / Strength   AROM / PROM / Strength Strength   Strength   Overall Strength Deficits   Overall Strength Comments B hip flex 3+/5, L knee flex 4/5, L knee ext 4/5, all others [facility]   Special Tests    Special Tests Hip Special Tests   Hip Special Tests   Ober's Test;Piriformis Test;Other;Marcello Moores Test;Other2   Thomas Test    Findings Negative   Side Left   Ober's Test   Findings Negative   Side Left   Piriformis Test   Findings Positive   Side  Left   other   Findings Positive   Comments Greater Trochanter bursitis was positive for LLE (had pt in supine, hip and knee flex, moving leg into abd) causing pain   other   Findings Positive   Side Left   Comments psoas test   Transfers   Transfers Sit to Stand;Stand to Sit   Sit to Stand 7: Independent   Stand to Sit 7: Independent   Ambulation/Gait   Ambulation/Gait Yes   Ambulation/Gait Assistance 7: Independent   Ambulation Distance (Feet) 115 Feet   Assistive device None   Gait Pattern Decreased stance time - left;Decreased stride length;Trunk flexed;Wide base of support;Antalgic;Lateral hip instability   Ambulation Surface Level;Indoor   Gait velocity 2.66 ft/sec   Stairs Yes   Stairs Assistance 6: Modified independent (Device/Increase time)   Stair Management Technique Two rails;Alternating pattern;Forwards   Number of Stairs 4   Height of Stairs 6                           PT Education - 11/11/15 1414    Education provided Yes   Education Details Education on evaluation findings, transferring to [address] location for orthopedic issue specialization   Person(s) Educated Patient   Methods Explanation   Comprehension Verbalized understanding          PT Short Term Goals - 11/11/15 1422    PT SHORT TERM GOAL #1   Title Pt will be independent with HEP for L hip strengthening and stretching to improve functional mobility.  (Target Date: 12/09/15)   PT SHORT TERM GOAL #2   Title Pt will report no more than 2 point increase in L hip pain during therapy to indicate decreased pain as limiting factor in mobility.  (Target Date: 12/09/15)   PT SHORT TERM GOAL #3   Title Pt will tolerate >5 mins of ambulation at mod I level with no more than 2 point  increase in pain to indicate improvement on functional endurance in relation to pain. (Target Date: 12/09/15)   PT SHORT TERM GOAL #4   Title Pt will report initiation of community fitness program to continue her overall fitness and strengthening.  (Target Date: 12/09/15)  Plan - 11-20-15 1416    Clinical Impression Statement Pt presents with L lateral and anterior hip pain causing antalgic gait pattern, increased difficulty sleeping, and reported numbness in L hip when lying on L hip.  Based on special tests, pt seems to have irriated psoas muscle as well as tight IT band.  May also have some piriformis involvement.  Pts stated findings inconsistent at times.  She initially stated her hip had not given way, but at end of session states that it had buckled on her, but no fall.  Feel that pt would benefit from OP PT services, however feel that needs can be better met at [facility]. location for orthopedic speciality.     Pt will benefit from skilled therapeutic intervention in order to improve on the following deficits Abnormal gait;Decreased activity tolerance;Decreased endurance;Decreased strength;Impaired flexibility;Improper body mechanics;Postural dysfunction;Pain;Impaired perceived functional ability   Rehab Potential Good   PT Frequency 2x / week   PT Duration 4 weeks   PT Treatment/Interventions ADLs/Self Care Home Management;Electrical Stimulation;Moist Heat;Ultrasound;DME Instruction;Gait training;Stair training;Functional mobility training;Therapeutic exercise;Therapeutic activities;Balance training;Patient/family education;Manual techniques;Dry needling;Taping;Cryotherapy   PT Next Visit Plan [address] to assess hip pain further and adjust goals as needed   Recommended Other Services Recommend that pt transfer services to [facility] for orthopedic services.    Consulted and Agree with Plan of Care Patient          G-Codes - Nov 20, 2015 1100    Functional  Assessment Tool Used Gait speed 2.66 ft/sec   Functional Limitation Mobility: Walking and moving around   Mobility: Walking and Moving Around Current Status [phone]) At least 1 percent but less than 20 percent impaired, limited or restricted   Mobility: Walking and Moving Around Goal Status [phone]) 0 percent impaired, limited or restricted       Problem List Patient Active Problem List   Diagnosis Date Noted  . Neck pain, musculoskeletal 11/06/2015  . Vaginal itching 10/06/2015  . Lower back pain 08/06/2015  . At risk for polypharmacy 08/06/2015  . Right hip pain 07/04/2015  . Left hip pain 06/04/2015  . Constipation 04/21/2015  . Seasonal allergies 04/21/2015  . Insomnia 12/31/2014  . Osteoarthritis 12/11/2014  . Local skin infection 12/04/2014  . Bilateral arm pain 11/19/2014  . Blurry vision 11/19/2014  . Abnormal stress test   . Chest pain 10/07/2014  . Healthcare maintenance 10/07/2014  . Assistance with transportation 09/19/2014  . Bilateral leg pain 09/05/2014  . Liver mass 04/04/2014  . Anemia, unspecified 02/05/2014  . Unspecified vitamin D deficiency 03/11/2013  . Hyperparathyroidism ([place]) 03/08/2013  . Rotator cuff syndrome of right shoulder 02/26/2012  . Type II diabetes mellitus with neurological manifestations, uncontrolled ([place]) 06/12/2007  . HYPERLIPIDEMIA 06/12/2007  . ANXIETY 06/12/2007  . TOBACCO ABUSE 06/12/2007  . Depression 06/12/2007  . CONGENITAL BLINDNESS, LEFT EYE 06/12/2007  . Essential hypertension 06/12/2007    Cameron Sprang, PT, MPT [facility] [address] [place], [place], [address] Phone: [phone]   Fax:  [phone] 2015/11/20, 2:32 PM  Name: Susan Holmes MRN: [id number] Date of Birth: 03-12-1956

## 2015-11-12 ENCOUNTER — Other Ambulatory Visit: Payer: Self-pay | Admitting: *Deleted

## 2015-11-12 NOTE — Patient Outreach (Signed)
[place] [facility]) Care Management  11/11/2015  Susan Holmes 29-Aug-1956 [id number]   RN received a voice message from pt returning the call back made on 11/14 for the ongoing transition of care outreach calls. Will follow up accordingly.  Raina Mina, RN Care Management Coordinator [facility] [facility] Main Office [phone]

## 2015-11-12 NOTE — Progress Notes (Signed)
This encounter was created in error - please disregard.

## 2015-11-13 ENCOUNTER — Telehealth: Payer: Self-pay | Admitting: *Deleted

## 2015-11-13 ENCOUNTER — Encounter: Payer: Self-pay | Admitting: *Deleted

## 2015-11-13 ENCOUNTER — Telehealth: Payer: Self-pay | Admitting: Internal Medicine

## 2015-11-13 ENCOUNTER — Other Ambulatory Visit: Payer: Self-pay | Admitting: *Deleted

## 2015-11-13 NOTE — Patient Outreach (Signed)
[facility]) Care Management   11/13/2015  Susan Holmes 04/02/1956 [id number]  Susan Holmes is an 59 y.o. female  Subjective:  THN: Introduced pt to the [facility] program and verified enrollment as pt remains receptive. DIABETES: Pt reports she tries to take all her medication but still remain elevated on her CBG. States as her high was 500 "three times over the last week or two" and her lowest in the 200 which is her average. States some of her symptoms experienced at that time was being "thristy and more urination".  Pt states some of the things discussed today she was not aware of however will attempt to change her lifestyle to improve managing her care better.   MEDICATION: Pt states she was taking all her medications but did not have one of her medication with her usually supplies (Cozaar).  Pt reports her last A1C was over 14 and pt is aware of the normal levels and tried to make a few changes but was unsuccessful.  Pt has indicated she does not want to stop smoking and declined counseling and a smoking cessation program but has nicotine available on her medication list. Pt state when she is ready she attempt by administering this medications however has been unsuccessful in quitting.  NUTRITION: Pt states she drinks a lot of sodas and has been eating a lot of carbohydrates. Also states she eats a lot of sweets and a lot of snacks at night with elevated CBG in the morning. Pt reports she really only eats one good meal a day and is not aware of good dietary measures for a diabetes.   Objective:   Review of Systems  Constitutional: Negative.   HENT: Negative.   Eyes: Negative.   Respiratory: Negative.   Cardiovascular: Negative.   Gastrointestinal: Negative.   Genitourinary: Negative.   Musculoskeletal: Negative.   Skin: Negative.   Neurological: Negative.   Endo/Heme/Allergies: Negative.   Psychiatric/Behavioral: Positive for depression.       History of depression  currently on prescribed medication.    Physical Exam  Constitutional: She is oriented to person, place, and time. She appears well-developed and well-nourished.  Neck: Normal range of motion.  Cardiovascular: Normal heart sounds.   Respiratory: Effort normal and breath sounds normal.  GI: Soft. Bowel sounds are normal.  Musculoskeletal: Normal range of motion.  Neurological: She is alert and oriented to person, place, and time.  Skin: Skin is warm and dry.  Psychiatric: She has a normal mood and affect. Her behavior is normal. Judgment and thought content normal.    Current Medications:   Current Outpatient Prescriptions  Medication Sig Dispense Refill  . ACCU-CHEK FASTCLIX LANCETS MISC Check blood sugar 3x a day as instructed dx cod 250.00 insulin requiring 102 each 5  . Alcohol Swabs (ALCOHOL PREP) 70 % PADS     . amLODipine (NORVASC) 5 MG tablet TAKE 1 TABLET(5 MG) BY MOUTH DAILY 90 tablet 3  . aspirin 81 MG EC tablet TAKE 1 TABLET BY MOUTH EVERY DAY 30 tablet 0  . Blood Glucose Calibration (ACCU-CHEK SMARTVIEW CONTROL) LIQD     . Blood Glucose Monitoring Suppl (ACCU-CHEK AVIVA PLUS) W/DEVICE KIT Use to check blood sugar 3 to 4 times daily. diag code 250.62. Insulin dependent 1 kit 0  . brimonidine (ALPHAGAN) 0.2 % ophthalmic solution Place 1 drop into both eyes 2 (two) times daily.    . citalopram (CELEXA) 40 MG tablet Take 40 mg by mouth daily.  2  . clonazePAM (KLONOPIN) 0.5 MG tablet Take 0.5 mg by mouth daily as needed for anxiety.     . diclofenac (VOLTAREN) 75 MG EC tablet Take 1 tablet (75 mg total) by mouth 2 (two) times daily as needed for moderate pain (with food). 180 tablet 0  . DULoxetine (CYMBALTA) 30 MG capsule TAKE ONE CAPSULE BY MOUTH DAILY 30 capsule 0  . exenatide (BYETTA 5 MCG PEN) 5 MCG/0.02ML SOPN injection Inject 0.02 mLs (5 mcg total) into the skin 2 (two) times daily with a meal. 1.2 mL 3  . fluconazole (DIFLUCAN) 150 MG tablet Take 1 tablet (150 mg total)  by mouth daily. 1 tablet 0  . glucose blood (ACCU-CHEK AVIVA PLUS) test strip Use to test blood sugar 3 times daily. diag code E11.41. Insulin dependent 100 each 11  . Insulin Glargine (LANTUS SOLOSTAR) 100 UNIT/ML Solostar Pen Inject 40 Units into the skin at bedtime. 15 mL 3  . insulin lispro (HUMALOG KWIKPEN) 100 UNIT/ML KiwkPen Inject 8 units with lunch and 12 units with dinner. Eat meals within 10 minutes of injection. (Patient taking differently: Inject 14 Units into the skin daily. ) 15 mL 3  . Insulin Pen Needle (VALUMARK PEN NEEDLES) 31G X 8 MM MISC 1 each by Does not apply route daily. 100 each 3  . Lancet Devices (ADJUSTABLE LANCING DEVICE) MISC     . meloxicam (MOBIC) 7.5 MG tablet Take 7.5 mg by mouth daily.    . metFORMIN (GLUCOPHAGE) 1000 MG tablet Take 1067m (1 tablets) 2 times a day. 60 tablet 11  . mirtazapine (REMERON) 30 MG tablet     . nicotine polacrilex (NICORETTE) 4 MG gum Take 1 each (4 mg total) by mouth as needed for smoking cessation. 100 tablet 0  . rosuvastatin (CRESTOR) 20 MG tablet Take 1 tablet (20 mg total) by mouth at bedtime. 30 tablet 11  . tiZANidine (ZANAFLEX) 2 MG tablet Take 1 tablet (2 mg total) by mouth every 6 (six) hours as needed for muscle spasms. 10 tablet 0  . traZODone (DESYREL) 100 MG tablet Take 200 mg by mouth at bedtime.     .Marland KitchenglipiZIDE (GLUCOTROL) 10 MG tablet TAKE 1 TABLET BY MOUTH DAILY BEFORE BREAKFAST (Patient not taking: Reported on 10/27/2015) 30 tablet 3  . loratadine (CLARITIN) 10 MG tablet Take 1 tablet (10 mg total) by mouth daily. (Patient not taking: Reported on 11/13/2015) 30 tablet 2  . losartan (COZAAR) 50 MG tablet Take 1 tablet (50 mg total) by mouth 2 (two) times daily. (Patient not taking: Reported on 11/13/2015) 180 tablet 1  . magnesium citrate SOLN Take 296 mLs (1 Bottle total) by mouth once. (Patient not taking: Reported on 11/13/2015) 195 mL 0  . polyethylene glycol (MIRALAX / GLYCOLAX) packet Take 17 g by mouth daily.  (Patient not taking: Reported on 11/13/2015) 14 each 0  . prednisoLONE acetate (PRED FORTE) 1 % ophthalmic suspension Place 1 drop into both eyes 2 (two) times daily.    . sodium chloride (OCEAN) 0.65 % SOLN nasal spray Place 2 sprays into both nostrils as needed for congestion. (Patient not taking: Reported on 11/13/2015) 60 mL 2   No current facility-administered medications for this visit.    Functional Status:   In your present state of health, do you have any difficulty performing the following activities: 11/13/2015 11/10/2015  Hearing? N N  Vision? N Y  Difficulty concentrating or making decisions? Y N  Walking or climbing stairs? NAggie Moats  Dressing or bathing? N N  Doing errands, shopping? N N  Preparing Food and eating ? N N  Using the Toilet? N N  In the past six months, have you accidently leaked urine? N N  Do you have problems with loss of bowel control? N N  Managing your Medications? N N  Managing your Finances? N Y  Housekeeping or managing your Housekeeping? N Y    Fall/Depression Screening:    PHQ 2/9 Scores 11/13/2015 11/06/2015 10/15/2015 10/06/2015 09/29/2015 08/21/2015 08/05/2015  PHQ - 2 Score 2 0 1 0 0 0 0  PHQ- 9 Score 8 - - - - - -  Exception Documentation - - - - - - -   Filed Vitals:   11/13/15 1349  BP: 138/88  Pulse: 76  Resp: 20    Assessment:   Enrollment into the [facility] program and services  Case management related to diabetes Non-adherence related to prescribed medication Nutritional issues related to poor eating habits Social consult related to A.D  Plan:  Physical assessment completed with noted issues and intervention initiated today. Will enroll pt into [facility] services for the diabetic program. Will educate on diabetes and verify pt's understanding of hypo-hyperglycemia and along with signs and symptoms of each and pt's aware of what to do i acute symptoms should occur. Will provide printed EMMI educational material and discussed accordingly.  Printed material as followed delivered and reviewed with this pt (Diabetes Eye Care/Diabetes and Blood Pressure/Diabetes: Why check your blood sugar and Nutritional and Healthy Eating).  Will review all medications and verified supply. Pt did not have one prescribed medication Cozaar. Will contact pt's pharmacy and verified last fill dose. Spoke with Cheryll Cockayne [facility]) who verified pt has not filled this ninety day supply since April this year (April-June). RN will contact pt's provided and update concerning pt's non-adherence. Will contact pt's provider ( nurse) and updated on pt absence in administering this medication over the last four months with pt's BP today. Nurse indicated she would present this information to pt's doctor and will confirm however pt was not to take this medication until confirmed by her provider (pt with understanding).  Will inform the pt that [facility] has available a smoking cessation program when she is interested in quitting smoking. Note pharmacy will be in contact with this pt as RN offered to inquire further if she is interested in this resources of services offered.  Will notify assigned social worker that pt is interested in an A.D packet with education. RN has discussed Forensic scientist and HCPOA.and the advantage of having this completed if ever needed in case of any emergency.  Plan of care discussed and pt's readiness to proceed with the program and services related to her diabetes and managing her care better. Pt has agreed with this plan of care and goals discussed. Will schedule next home visit and provider available contact number if needed prior to the next home visit. RN will also send today's initial home visit and enrollment to primary doctor.   Raina Mina, RN Care Management Coordinator [facility] Main Office [phone]

## 2015-11-13 NOTE — Telephone Encounter (Signed)
call from [facility], she states that at a home visit today it was found that pt has not taken cozzar since 06/2015. It arrived at pharmacy on time but pt never picked up, sheets states she did not know she was to take this med. Please advise

## 2015-11-14 ENCOUNTER — Other Ambulatory Visit: Payer: Self-pay | Admitting: Pharmacist

## 2015-11-14 NOTE — Patient Outreach (Signed)
[facility]) Care Management  [place]   11/14/2015  Susan Holmes Apr 05, 1956 [id number]  Subjective: Susan Holmes is a 59 y.o. female who was referred to [place] for medication education.   This patient has been seen by Mannie Stabile, PharmD, at [facility] for medication and adherence. She is to continue to follow up with them per pharmacy encounter note.   Objective:   Current Medications: Current Outpatient Prescriptions  Medication Sig Dispense Refill  . ACCU-CHEK FASTCLIX LANCETS MISC Check blood sugar 3x a day as instructed dx cod 250.00 insulin requiring 102 each 5  . Alcohol Swabs (ALCOHOL PREP) 70 % PADS     . amLODipine (NORVASC) 5 MG tablet TAKE 1 TABLET(5 MG) BY MOUTH DAILY 90 tablet 3  . aspirin 81 MG EC tablet TAKE 1 TABLET BY MOUTH EVERY DAY 30 tablet 0  . Blood Glucose Calibration (ACCU-CHEK SMARTVIEW CONTROL) LIQD     . Blood Glucose Monitoring Suppl (ACCU-CHEK AVIVA PLUS) W/DEVICE KIT Use to check blood sugar 3 to 4 times daily. diag code 250.62. Insulin dependent 1 kit 0  . brimonidine (ALPHAGAN) 0.2 % ophthalmic solution Place 1 drop into both eyes 2 (two) times daily.    . citalopram (CELEXA) 40 MG tablet Take 40 mg by mouth daily.  2  . clonazePAM (KLONOPIN) 0.5 MG tablet Take 0.5 mg by mouth daily as needed for anxiety.     . diclofenac (VOLTAREN) 75 MG EC tablet Take 1 tablet (75 mg total) by mouth 2 (two) times daily as needed for moderate pain (with food). 180 tablet 0  . DULoxetine (CYMBALTA) 30 MG capsule TAKE ONE CAPSULE BY MOUTH DAILY 30 capsule 0  . exenatide (BYETTA 5 MCG PEN) 5 MCG/0.02ML SOPN injection Inject 0.02 mLs (5 mcg total) into the skin 2 (two) times daily with a meal. 1.2 mL 3  . fluconazole (DIFLUCAN) 150 MG tablet Take 1 tablet (150 mg total) by mouth daily. 1 tablet 0  . glipiZIDE (GLUCOTROL) 10 MG tablet TAKE 1 TABLET BY MOUTH DAILY BEFORE BREAKFAST (Patient not taking: Reported on  10/27/2015) 30 tablet 3  . glucose blood (ACCU-CHEK AVIVA PLUS) test strip Use to test blood sugar 3 times daily. diag code E11.41. Insulin dependent 100 each 11  . Insulin Glargine (LANTUS SOLOSTAR) 100 UNIT/ML Solostar Pen Inject 40 Units into the skin at bedtime. 15 mL 3  . insulin lispro (HUMALOG KWIKPEN) 100 UNIT/ML KiwkPen Inject 8 units with lunch and 12 units with dinner. Eat meals within 10 minutes of injection. (Patient taking differently: Inject 14 Units into the skin daily. ) 15 mL 3  . Insulin Pen Needle (VALUMARK PEN NEEDLES) 31G X 8 MM MISC 1 each by Does not apply route daily. 100 each 3  . Lancet Devices (ADJUSTABLE LANCING DEVICE) MISC     . loratadine (CLARITIN) 10 MG tablet Take 1 tablet (10 mg total) by mouth daily. (Patient not taking: Reported on 11/13/2015) 30 tablet 2  . losartan (COZAAR) 50 MG tablet Take 1 tablet (50 mg total) by mouth 2 (two) times daily. (Patient not taking: Reported on 11/13/2015) 180 tablet 1  . magnesium citrate SOLN Take 296 mLs (1 Bottle total) by mouth once. (Patient not taking: Reported on 11/13/2015) 195 mL 0  . meloxicam (MOBIC) 7.5 MG tablet Take 7.5 mg by mouth daily.    . metFORMIN (GLUCOPHAGE) 1000 MG tablet Take 1034m (1 tablets) 2 times a day. 6Octavia  tablet 11  . mirtazapine (REMERON) 30 MG tablet     . nicotine polacrilex (NICORETTE) 4 MG gum Take 1 each (4 mg total) by mouth as needed for smoking cessation. 100 tablet 0  . polyethylene glycol (MIRALAX / GLYCOLAX) packet Take 17 g by mouth daily. (Patient not taking: Reported on 11/13/2015) 14 each 0  . prednisoLONE acetate (PRED FORTE) 1 % ophthalmic suspension Place 1 drop into both eyes 2 (two) times daily.    . rosuvastatin (CRESTOR) 20 MG tablet Take 1 tablet (20 mg total) by mouth at bedtime. 30 tablet 11  . sodium chloride (OCEAN) 0.65 % SOLN nasal spray Place 2 sprays into both nostrils as needed for congestion. (Patient not taking: Reported on 11/13/2015) 60 mL 2  . tiZANidine  (ZANAFLEX) 2 MG tablet Take 1 tablet (2 mg total) by mouth every 6 (six) hours as needed for muscle spasms. 10 tablet 0  . traZODone (DESYREL) 100 MG tablet Take 200 mg by mouth at bedtime.      No current facility-administered medications for this visit.    Functional Status: In your present state of health, do you have any difficulty performing the following activities: 11/13/2015 11/10/2015  Hearing? N N  Vision? N Y  Difficulty concentrating or making decisions? Y N  Walking or climbing stairs? N Y  Dressing or bathing? N N  Doing errands, shopping? N N  Preparing Food and eating ? N N  Using the Toilet? N N  In the past six months, have you accidently leaked urine? N N  Do you have problems with loss of bowel control? N N  Managing your Medications? N N  Managing your Finances? N Y  Housekeeping or managing your Housekeeping? N Y    Fall/Depression Screening: PHQ 2/9 Scores 11/13/2015 11/06/2015 10/15/2015 10/06/2015 09/29/2015 08/21/2015 08/05/2015  PHQ - 2 Score 2 0 1 0 0 0 0  PHQ- 9 Score 8 - - - - - -  Exception Documentation - - - - - - -    Assessment: 1. Medication education: patient has been educated by primary care physician's office on medications and is to continue to follow up with the pharmacist there. I am worried that it will be overwhelming to her to duplicate services.   Plan: 1. Medication education: will follow up with Mannie Stabile, PharmD, to see if she is planning to see patient for 11/18/15 primary care visit. If she is no longer following up with the patient, I will reach out to the patient to provide further medication education.  Nicoletta Ba, PharmD, [facility] [phone]

## 2015-11-15 NOTE — Telephone Encounter (Signed)
Yes patient is supposed to be taking this medication.

## 2015-11-18 ENCOUNTER — Ambulatory Visit (INDEPENDENT_AMBULATORY_CARE_PROVIDER_SITE_OTHER): Payer: Medicare Other | Admitting: Internal Medicine

## 2015-11-18 VITALS — BP 136/73 | HR 92 | Temp 97.8°F | Ht 67.0 in | Wt 183.2 lb

## 2015-11-18 DIAGNOSIS — M25552 Pain in left hip: Secondary | ICD-10-CM | POA: Diagnosis not present

## 2015-11-18 DIAGNOSIS — E1165 Type 2 diabetes mellitus with hyperglycemia: Secondary | ICD-10-CM | POA: Diagnosis not present

## 2015-11-18 DIAGNOSIS — IMO0002 Reserved for concepts with insufficient information to code with codable children: Secondary | ICD-10-CM

## 2015-11-18 DIAGNOSIS — E1140 Type 2 diabetes mellitus with diabetic neuropathy, unspecified: Secondary | ICD-10-CM | POA: Diagnosis not present

## 2015-11-18 DIAGNOSIS — Z794 Long term (current) use of insulin: Secondary | ICD-10-CM

## 2015-11-18 MED ORDER — DULOXETINE HCL 60 MG PO CPEP: 60.0000 mg | ORAL_CAPSULE | Freq: Every day | ORAL | Status: DC

## 2015-11-18 NOTE — Patient Instructions (Signed)
Ms. Koeneman it was nice seeing you today.  -Hip pain:  Dose of Cymbalta has been changed: Take Cymbalta 60 mg: 1 capsule by mouth daily  Continue taking Voltaren EC 75 mg: 1 tablet by mouth twice daily   I have ordered an MRI of your hip today and our clinic will schedule the appointment for you. I will call you when the results come back.     -Please check your blood sugars 3 times daily (before meals) and bring your glucose meter to the next visit.   -Continue taking your diabetes medications as before.   -Return to the clinic in 1 month.

## 2015-11-19 ENCOUNTER — Ambulatory Visit: Payer: Medicare Other | Admitting: Physical Therapy

## 2015-11-20 NOTE — Progress Notes (Signed)
Patient ID: Susan Holmes, female   DOB: Apr 22, 1956, 59 y.o.   MRN: [id number]   Subjective:   Patient ID: Susan Holmes female   DOB: 08-18-1956 59 y.o.   MRN: [id number]  HPI: [name] is a 59 y.o. F with a PMHx of conditions listed below presenting to the clinic to discuss her diabetes and hip pain. Please see assessment and plan for the status of the patient's chronic medical conditions.     Past Medical History  Diagnosis Date  . Hepatitis B infection pt unsure    resolved  . Diabetes mellitus, type II ([place])   . HTN (hypertension)   . Hyperlipidemia   . Congenital blindness     L eye  . Tobacco abuse     .5 PPD smoker  . Anxiety   . Depression   . Arthritis   . Abnormal stress test   . Glaucoma    Current Outpatient Prescriptions  Medication Sig Dispense Refill  . ACCU-CHEK FASTCLIX LANCETS MISC Check blood sugar 3x a day as instructed dx cod 250.00 insulin requiring 102 each 5  . Alcohol Swabs (ALCOHOL PREP) 70 % PADS     . amLODipine (NORVASC) 5 MG tablet TAKE 1 TABLET(5 MG) BY MOUTH DAILY 90 tablet 3  . aspirin 81 MG EC tablet TAKE 1 TABLET BY MOUTH EVERY DAY 30 tablet 0  . Blood Glucose Calibration (ACCU-CHEK SMARTVIEW CONTROL) LIQD     . Blood Glucose Monitoring Suppl (ACCU-CHEK AVIVA PLUS) W/DEVICE KIT Use to check blood sugar 3 to 4 times daily. diag code 250.62. Insulin dependent 1 kit 0  . citalopram (CELEXA) 40 MG tablet Take 40 mg by mouth daily.  2  . clonazePAM (KLONOPIN) 0.5 MG tablet Take 0.5 mg by mouth daily as needed for anxiety.     . diclofenac (VOLTAREN) 75 MG EC tablet Take 1 tablet (75 mg total) by mouth 2 (two) times daily as needed for moderate pain (with food). 180 tablet 0  . DULoxetine (CYMBALTA) 60 MG capsule Take 1 capsule (60 mg total) by mouth daily. 30 capsule 1  . exenatide (BYETTA 5 MCG PEN) 5 MCG/0.02ML SOPN injection Inject 0.02 mLs (5 mcg total) into the skin 2 (two) times daily with a meal. 1.2 mL 3  . glipiZIDE  (GLUCOTROL) 10 MG tablet TAKE 1 TABLET BY MOUTH DAILY BEFORE BREAKFAST (Patient not taking: Reported on 10/27/2015) 30 tablet 3  . glucose blood (ACCU-CHEK AVIVA PLUS) test strip Use to test blood sugar 3 times daily. diag code E11.41. Insulin dependent 100 each 11  . Insulin Glargine (LANTUS SOLOSTAR) 100 UNIT/ML Solostar Pen Inject 40 Units into the skin at bedtime. 15 mL 3  . insulin lispro (HUMALOG KWIKPEN) 100 UNIT/ML KiwkPen Inject 8 units with lunch and 12 units with dinner. Eat meals within 10 minutes of injection. (Patient taking differently: Inject 14 Units into the skin daily. ) 15 mL 3  . Insulin Pen Needle (VALUMARK PEN NEEDLES) 31G X 8 MM MISC 1 each by Does not apply route daily. 100 each 3  . Lancet Devices (ADJUSTABLE LANCING DEVICE) MISC     . loratadine (CLARITIN) 10 MG tablet Take 1 tablet (10 mg total) by mouth daily. (Patient not taking: Reported on 11/13/2015) 30 tablet 2  . losartan (COZAAR) 50 MG tablet Take 1 tablet (50 mg total) by mouth 2 (two) times daily. (Patient not taking: Reported on 11/13/2015) 180 tablet 1  . magnesium citrate SOLN Take 296 mLs (  1 Bottle total) by mouth once. (Patient not taking: Reported on 11/13/2015) 195 mL 0  . meloxicam (MOBIC) 7.5 MG tablet Take 7.5 mg by mouth daily.    . metFORMIN (GLUCOPHAGE) 1000 MG tablet Take 1069m (1 tablets) 2 times a day. 60 tablet 11  . mirtazapine (REMERON) 30 MG tablet     . nicotine polacrilex (NICORETTE) 4 MG gum Take 1 each (4 mg total) by mouth as needed for smoking cessation. 100 tablet 0  . polyethylene glycol (MIRALAX / GLYCOLAX) packet Take 17 g by mouth daily. (Patient not taking: Reported on 11/13/2015) 14 each 0  . rosuvastatin (CRESTOR) 20 MG tablet Take 1 tablet (20 mg total) by mouth at bedtime. 30 tablet 11  . sodium chloride (OCEAN) 0.65 % SOLN nasal spray Place 2 sprays into both nostrils as needed for congestion. (Patient not taking: Reported on 11/13/2015) 60 mL 2  . tiZANidine (ZANAFLEX) 2 MG  tablet Take 1 tablet (2 mg total) by mouth every 6 (six) hours as needed for muscle spasms. 10 tablet 0  . traZODone (DESYREL) 100 MG tablet Take 200 mg by mouth at bedtime.      No current facility-administered medications for this visit.   Family History  Problem Relation Age of Onset  . Heart disease Mother 647 . Hypertension Mother   . Heart disease Father 666 . Aneurysm Brother   . Aneurysm Sister   . Colon cancer Neg Hx   . Esophageal cancer Neg Hx   . Stomach cancer Neg Hx    Social History   Social History  . Marital Status: Divorced    Spouse Name: N/A  . Number of Children: 1  . Years of Education: 12   Occupational History  .  Unemployed   Social History Main Topics  . Smoking status: Current Every Day Smoker -- 0.40 packs/day for 20 years    Types: Cigarettes  . Smokeless tobacco: Current User    Types: Chew     Comment: Nicorette gum.  .Marland KitchenAlcohol Use: No  . Drug Use: No  . Sexual Activity: Not Currently    Birth Control/ Protection: Surgical   Other Topics Concern  . Not on file   Social History Narrative   Review of Systems: Review of Systems  Constitutional: Negative for fever and chills.  HENT: Negative for ear pain.   Eyes: Negative for blurred vision and pain.  Respiratory: Negative for cough, shortness of breath and wheezing.   Cardiovascular: Negative for chest pain and leg swelling.  Gastrointestinal: Negative for nausea, vomiting, abdominal pain, diarrhea and constipation.  Genitourinary: Negative for dysuria, urgency and frequency.  Musculoskeletal: Positive for joint pain. Negative for myalgias.       L hip pain  Skin: Negative for itching and rash.  Neurological: Negative for sensory change, focal weakness and headaches.  Endo/Heme/Allergies:       Polydipsia and Polyuria    Objective:  Physical Exam: Filed Vitals:   11/18/15 1442 11/18/15 1443  BP: 136/73   Pulse: 92   Temp: 97.8 F (36.6 C)   TempSrc: Oral   Height:  '5\' 7"'   (1.702 m)  Weight: 183 lb 3.2 oz (83.099 kg)   SpO2: 99%    Physical Exam  Constitutional: She is oriented to person, place, and time. She appears well-developed and well-nourished. No distress.  HENT:  Head: Normocephalic and atraumatic.  Eyes: EOM are normal. Pupils are equal, round, and reactive to light.  Neck: Neck  supple. No tracheal deviation present.  Cardiovascular: Normal rate, regular rhythm and intact distal pulses.   Pulmonary/Chest: Effort normal. No respiratory distress. She has no wheezes. She has no rales.  Abdominal: Soft. Bowel sounds are normal. She exhibits no distension. There is no tenderness.  Musculoskeletal: She exhibits no edema.  Neurological: She is alert and oriented to person, place, and time.  Strength and sensation grossly intact in b/l upper and lower extremities.   Skin: Skin is warm and dry.   Assessment & Plan:

## 2015-11-21 ENCOUNTER — Encounter: Payer: Self-pay | Admitting: *Deleted

## 2015-11-21 ENCOUNTER — Other Ambulatory Visit: Payer: Self-pay | Admitting: *Deleted

## 2015-11-21 NOTE — Patient Outreach (Addendum)
[facility]) Care Management  [facility] Social Work  11/21/2015  Susan Holmes 06-17-1956 [id number]  Subjective:   "I'm going to have my daughter help me fill out my paperwork to get my wishes on paper".  Objective:   CSW agreed to provide patient with an Advanced Directives packet ([place] documents), as well as assist with completion, if necessary.  Current Medications:  Current Outpatient Prescriptions  Medication Sig Dispense Refill  . ACCU-CHEK FASTCLIX LANCETS MISC Check blood sugar 3x a day as instructed dx cod 250.00 insulin requiring 102 each 5  . Alcohol Swabs (ALCOHOL PREP) 70 % PADS     . amLODipine (NORVASC) 5 MG tablet TAKE 1 TABLET(5 MG) BY MOUTH DAILY 90 tablet 3  . aspirin 81 MG EC tablet TAKE 1 TABLET BY MOUTH EVERY DAY 30 tablet 0  . Blood Glucose Calibration (ACCU-CHEK SMARTVIEW CONTROL) LIQD     . Blood Glucose Monitoring Suppl (ACCU-CHEK AVIVA PLUS) W/DEVICE KIT Use to check blood sugar 3 to 4 times daily. diag code 250.62. Insulin dependent 1 kit 0  . citalopram (CELEXA) 40 MG tablet Take 40 mg by mouth daily.  2  . clonazePAM (KLONOPIN) 0.5 MG tablet Take 0.5 mg by mouth daily as needed for anxiety.     . diclofenac (VOLTAREN) 75 MG EC tablet Take 1 tablet (75 mg total) by mouth 2 (two) times daily as needed for moderate pain (with food). 180 tablet 0  . DULoxetine (CYMBALTA) 60 MG capsule Take 1 capsule (60 mg total) by mouth daily. 30 capsule 1  . exenatide (BYETTA 5 MCG PEN) 5 MCG/0.02ML SOPN injection Inject 0.02 mLs (5 mcg total) into the skin 2 (two) times daily with a meal. 1.2 mL 3  . glipiZIDE (GLUCOTROL) 10 MG tablet TAKE 1 TABLET BY MOUTH DAILY BEFORE BREAKFAST (Patient not taking: Reported on 10/27/2015) 30 tablet 3  . glucose blood (ACCU-CHEK AVIVA PLUS) test strip Use to test blood sugar 3 times daily. diag code E11.41. Insulin dependent 100 each 11  . Insulin Glargine (LANTUS SOLOSTAR) 100  UNIT/ML Solostar Pen Inject 40 Units into the skin at bedtime. 15 mL 3  . insulin lispro (HUMALOG KWIKPEN) 100 UNIT/ML KiwkPen Inject 8 units with lunch and 12 units with dinner. Eat meals within 10 minutes of injection. (Patient taking differently: Inject 14 Units into the skin daily. ) 15 mL 3  . Insulin Pen Needle (VALUMARK PEN NEEDLES) 31G X 8 MM MISC 1 each by Does not apply route daily. 100 each 3  . Lancet Devices (ADJUSTABLE LANCING DEVICE) MISC     . loratadine (CLARITIN) 10 MG tablet Take 1 tablet (10 mg total) by mouth daily. (Patient not taking: Reported on 11/13/2015) 30 tablet 2  . losartan (COZAAR) 50 MG tablet Take 1 tablet (50 mg total) by mouth 2 (two) times daily. (Patient not taking: Reported on 11/13/2015) 180 tablet 1  . magnesium citrate SOLN Take 296 mLs (1 Bottle total) by mouth once. (Patient not taking: Reported on 11/13/2015) 195 mL 0  . meloxicam (MOBIC) 7.5 MG tablet Take 7.5 mg by mouth daily.    . metFORMIN (GLUCOPHAGE) 1000 MG tablet Take 1038m (1 tablets) 2 times a day. 60 tablet 11  . mirtazapine (REMERON) 30 MG tablet     . nicotine polacrilex (NICORETTE) 4 MG gum Take 1 each (4 mg total) by mouth as needed for smoking cessation. 100 tablet 0  . polyethylene glycol (MIRALAX /  GLYCOLAX) packet Take 17 g by mouth daily. (Patient not taking: Reported on 11/13/2015) 14 each 0  . rosuvastatin (CRESTOR) 20 MG tablet Take 1 tablet (20 mg total) by mouth at bedtime. 30 tablet 11  . sodium chloride (OCEAN) 0.65 % SOLN nasal spray Place 2 sprays into both nostrils as needed for congestion. (Patient not taking: Reported on 11/13/2015) 60 mL 2  . tiZANidine (ZANAFLEX) 2 MG tablet Take 1 tablet (2 mg total) by mouth every 6 (six) hours as needed for muscle spasms. 10 tablet 0  . traZODone (DESYREL) 100 MG tablet Take 200 mg by mouth at bedtime.      No current facility-administered medications for this visit.    Functional Status:  In your present state of health, do you  have any difficulty performing the following activities: 11/21/2015 11/18/2015  Hearing? N N  Vision? N N  Difficulty concentrating or making decisions? N N  Walking or climbing stairs? Y Y  Dressing or bathing? N N  Doing errands, shopping? N N  Preparing Food and eating ? N -  Using the Toilet? N -  In the past six months, have you accidently leaked urine? N -  Do you have problems with loss of bowel control? N -  Managing your Medications? N -  Managing your Finances? N -  Housekeeping or managing your Housekeeping? N -    Fall/Depression Screening:  PHQ 2/9 Scores 11/21/2015 11/18/2015 11/13/2015 11/06/2015 10/15/2015 10/06/2015 09/29/2015  PHQ - 2 Score _0 0 1 0 0  PHQ- 9 Score _1 - - - -  Exception Documentation - - - - - - -    Assessment:   CSW was able to make initial contact with patient today to perform phone assessment, as well as assess and assist with social work needs and services.  CSW was also able to confirm that patient received the Advanced Directives packet (Living Will and [place] documents) that CSW mailed to patient's home.  CSW introduced self, explained role and types of services provided through [facility] ([place] [facility]).  CSW further explained to patient that CSW works with patient's RNCM, also with [place] [facility], Raina Mina. CSW then explained the reason for the call, indicating that Ms. Zigmund Daniel thought that patient would benefit from social work services and resources to assist with completion of her Financial controller.  CSW obtained two HIPAA compliant identifiers from patient, which included patient's name and date of birth. Patient admitted that she has received the packet of information that CSW mailed to her home and has also had an opportunity to review it's contents.  Patient indicated that she plans to review her Advanced Directives with her daughter, Susan Holmes and  have her assist with completion.  CSW then explained to patient that there are a list of [place] affiliated agencies that are able to notarize the documents for patient, free of charge.  Patient was also instructed to go to her local bank to have the documents notarized.  No additional social work needs have been identified at this time.  Plan:   CSW will perform a case closure on patient, as all goals of treatment have been met from social work standpoint and no additional social work needs have been identified at this time. CSW will notify patient's RNCM with [facility] [facility], Raina Mina of CSW's plans to close patient's case. CSW will fax a correspondence  letter to patient's Primary Care Physician, Dr. Shela Leff to ensure that Dr. Marlowe Sax is aware of CSW's involvement with patient. CSW will submit a case closure request to Lurline Del, Care Management Assistant with [facility], in the form of an [employer].  CSW will ensure that Mrs. Laurance Flatten is aware of Raina Mina, RNCM with [facility], continued involvement with patient's care.  Nat Christen, BSW, MSW, LCSW  Licensed Education officer, [facility]  Mailing [address] [address] [place], [place] [address] Physical Address-[address], [place], [place] [address] Toll Free Main # [phone] Fax # [phone] Cell # [phone]  Fax # [phone]  [email or web address]

## 2015-11-21 NOTE — Assessment & Plan Note (Signed)
Lab Results  Component Value Date   HGBA1C >14.0 09/29/2015   HGBA1C 9.8 06/04/2015   HGBA1C >14.0 03/17/2015     Assessment: Diabetes control:  uncontrolled  Progress toward A1C goal:   above goal Comments: Patient is currently on Byetta 5 mg BID, Lantus 40 u, Novolog 14 u, and Metformin 1000 mg BID. Meter showing avg glucose 383, high 591, and low 198. Patient reports having increased urination and thirst when her sugar is too high.   Plan: Medications:  continue current medications Home glucose monitoring: 3 times daily before meals  Instruction/counseling given: diet and exercise  Other plans: No medication changes at this time. Patient has been advised to return in 1 month with her meter. She reported checking her blood glucose 2 times a day - before breakfast and after dinner. Also advised to check her blood glucose TID before meals.

## 2015-11-21 NOTE — Patient Outreach (Signed)
[facility]) Care Management  11/21/2015  [name] 1956-05-04 [id number]   Request received from [facility], LCSW to provide patient with an Advanced Directives packet. Mailed packet on 11/21/15.  Thanks, [place] [facility] CM Assistant Phone: [phone] Fax: [phone]

## 2015-11-21 NOTE — Assessment & Plan Note (Addendum)
Patient is currently on Cymbalta 30 mg qd and Diclofenac 75 mg BID. She is still complaining of L hip pain. States the pain radiates down her entire L leg and is worse at night when lying down on the L side or when walking. Patient describes the pain as "cold and throbbing" and states it now wakes her up at night. Reports having the pain for 4-5 months now. Patient was previously referred to PT and states it is not helping. In addition, she was sent to Sports Med who suggested patient should be kept on Cymbala due to the migratory nature of her pain. Imaging so far has not shown any source for the pain. Physical exam showing - strength and sensation grossly intact. Patient denies having any back pain, fecal/ urinary incontinence. -F/u hip MRI -Cymbalta inc to 60 mg qd -cont Diclofenac 75 mg BID -F/u visit in 1 month

## 2015-11-24 ENCOUNTER — Other Ambulatory Visit: Payer: Self-pay | Admitting: Pharmacist

## 2015-11-24 NOTE — Addendum Note (Signed)
Addended by: Forde Dandy on: 11/24/2015 11:01 AM   Modules accepted: Orders, Medications

## 2015-11-24 NOTE — Patient Outreach (Signed)
[facility]) Care Management  [place]   11/24/2015  Susan Holmes 20-May-1956 [id number]  Subjective: Susan Holmes is a 59 y.o. female who was referred to [facility] for medication education.   This patient has been seen by Mannie Stabile, PharmD, at [facility] for medication and adherence.   I called patient to see if she is interested in further medication education or if she had any other pharmacy needs. Patient declined pharmacy services.   Objective:   Current Medications: Current Outpatient Prescriptions  Medication Sig Dispense Refill  . ACCU-CHEK FASTCLIX LANCETS MISC Check blood sugar 3x a day as instructed dx cod 250.00 insulin requiring 102 each 5  . Alcohol Swabs (ALCOHOL PREP) 70 % PADS     . amLODipine (NORVASC) 5 MG tablet TAKE 1 TABLET(5 MG) BY MOUTH DAILY 90 tablet 3  . aspirin 81 MG EC tablet TAKE 1 TABLET BY MOUTH EVERY DAY 30 tablet 0  . Blood Glucose Calibration (ACCU-CHEK SMARTVIEW CONTROL) LIQD     . Blood Glucose Monitoring Suppl (ACCU-CHEK AVIVA PLUS) W/DEVICE KIT Use to check blood sugar 3 to 4 times daily. diag code 250.62. Insulin dependent 1 kit 0  . citalopram (CELEXA) 40 MG tablet Take 40 mg by mouth daily.  2  . clonazePAM (KLONOPIN) 0.5 MG tablet Take 0.5 mg by mouth daily as needed for anxiety.     . diclofenac (VOLTAREN) 75 MG EC tablet Take 1 tablet (75 mg total) by mouth 2 (two) times daily as needed for moderate pain (with food). 180 tablet 0  . DULoxetine (CYMBALTA) 60 MG capsule Take 1 capsule (60 mg total) by mouth daily. 30 capsule 1  . exenatide (BYETTA 5 MCG PEN) 5 MCG/0.02ML SOPN injection Inject 0.02 mLs (5 mcg total) into the skin 2 (two) times daily with a meal. 1.2 mL 3  . glucose blood (ACCU-CHEK AVIVA PLUS) test strip Use to test blood sugar 3 times daily. diag code E11.41. Insulin dependent 100 each 11  . Insulin Glargine (LANTUS SOLOSTAR) 100 UNIT/ML Solostar Pen Inject 40 Units  into the skin at bedtime. 15 mL 3  . insulin lispro (HUMALOG KWIKPEN) 100 UNIT/ML KiwkPen Inject 8 units with lunch and 12 units with dinner. Eat meals within 10 minutes of injection. (Patient taking differently: Inject 14 Units into the skin daily. ) 15 mL 3  . Insulin Pen Needle (VALUMARK PEN NEEDLES) 31G X 8 MM MISC 1 each by Does not apply route daily. 100 each 3  . Lancet Devices (ADJUSTABLE LANCING DEVICE) MISC     . loratadine (CLARITIN) 10 MG tablet Take 1 tablet (10 mg total) by mouth daily. (Patient not taking: Reported on 11/13/2015) 30 tablet 2  . losartan (COZAAR) 50 MG tablet Take 1 tablet (50 mg total) by mouth 2 (two) times daily. (Patient not taking: Reported on 11/13/2015) 180 tablet 1  . magnesium citrate SOLN Take 296 mLs (1 Bottle total) by mouth once. (Patient not taking: Reported on 11/13/2015) 195 mL 0  . meloxicam (MOBIC) 7.5 MG tablet Take 7.5 mg by mouth daily.    . metFORMIN (GLUCOPHAGE) 1000 MG tablet Take 1031m (1 tablets) 2 times a day. 60 tablet 11  . mirtazapine (REMERON) 30 MG tablet     . nicotine polacrilex (NICORETTE) 4 MG gum Take 1 each (4 mg total) by mouth as needed for smoking cessation. 100 tablet 0  . polyethylene glycol (MIRALAX / GLYCOLAX) packet Take 17  g by mouth daily. (Patient not taking: Reported on 11/13/2015) 14 each 0  . rosuvastatin (CRESTOR) 20 MG tablet Take 1 tablet (20 mg total) by mouth at bedtime. 30 tablet 11  . sodium chloride (OCEAN) 0.65 % SOLN nasal spray Place 2 sprays into both nostrils as needed for congestion. (Patient not taking: Reported on 11/13/2015) 60 mL 2  . tiZANidine (ZANAFLEX) 2 MG tablet Take 1 tablet (2 mg total) by mouth every 6 (six) hours as needed for muscle spasms. 10 tablet 0  . traZODone (DESYREL) 100 MG tablet Take 200 mg by mouth at bedtime.      No current facility-administered medications for this visit.    Functional Status: In your present state of health, do you have any difficulty performing the  following activities: 11/21/2015 11/18/2015  Hearing? N N  Vision? N N  Difficulty concentrating or making decisions? N N  Walking or climbing stairs? Y Y  Dressing or bathing? N N  Doing errands, shopping? N N  Preparing Food and eating ? N -  Using the Toilet? N -  In the past six months, have you accidently leaked urine? N -  Do you have problems with loss of bowel control? N -  Managing your Medications? N -  Managing your Finances? N -  Housekeeping or managing your Housekeeping? N -    Fall/Depression Screening: PHQ 2/9 Scores 11/21/2015 11/18/2015 11/13/2015 11/06/2015 10/15/2015 10/06/2015 09/29/2015  PHQ - 2 Score _0 0 1 0 0  PHQ- 9 Score _1 - - - -  Exception Documentation - - - - - - -    Assessment: 1. Medication education: patient is not interested in any pharmacy services through [facility] CM.    Plan: 1. Medication education: will not open to a pharmacy program as patient has declined services.   Nicoletta Ba, PharmD, [facility] [phone]

## 2015-11-25 NOTE — Progress Notes (Signed)
Internal Medicine Clinic Attending  I saw and evaluated the patient.  I personally confirmed the key portions of the history and exam documented by Dr. Rathore and I reviewed pertinent patient test results.  The assessment, diagnosis, and plan were formulated together and I agree with the documentation in the resident's note.  

## 2015-12-02 ENCOUNTER — Other Ambulatory Visit: Payer: Self-pay | Admitting: *Deleted

## 2015-12-02 NOTE — Patient Outreach (Signed)
[facility] [facility]) Care Management  12/02/2015  Susan Holmes 03-May-1956 [id number]  Telephone Assessment  RN spoke with pt today and inquired on there recent referral via [facility] for pharmacy and [place]. Pt states she has spoken with both team members and received needed assistance. Currently awaiting on the A.D packet and she will consult with her daughter to completed this packet and keep with her personal records. RN inquired further with pt's management as pt reports her CBG have decreased down from the 500 to the 200 since the initial home visit with this RN case manager.  RN offered to schedule the next home visit for this week as pt very receptive. Visit was scheduled as RN will follow up accordingly and continue ongoing education via teach back method in increasing pt's knowledge concerning managing her diabetes more efficiently. No other inquires or request at this time.  Raina Mina, RN Care Management Coordinator [employer] [facility] Main Office [phone]

## 2015-12-04 ENCOUNTER — Telehealth: Payer: Self-pay | Admitting: Internal Medicine

## 2015-12-04 ENCOUNTER — Other Ambulatory Visit: Payer: Self-pay | Admitting: *Deleted

## 2015-12-04 ENCOUNTER — Encounter: Payer: Self-pay | Admitting: *Deleted

## 2015-12-04 NOTE — Telephone Encounter (Signed)
Called Dr Zenia Resides office and this patient has had numerous appointments since 01/01/2015 to 08/06/2015.  All visit are being faxed as we have no Eye Exams on file since 11/2014

## 2015-12-04 NOTE — Patient Outreach (Signed)
[facility]) Care Management   12/04/2015  CERINITY ZYNDA 1956/02/25 [id number]  Susan Holmes is an 59 y.o. female  Subjective:  DM: Pt reports she has made some changes and her CBG have improved since RN last visit. Pt continues to completed her CBG several times daily and reports her readings have improved  from 300-400 to 200-300 with one read of 400 during the Holiday. Pt presented her 07/09/29 average which have improved since RN last home visit. Pt able to verbalized some of the related symptoms and express her appreciation concerning the ongoing teaching and education present by this RN related to her diabetes. Pt feeling more confident is taking her medical condition more seriously by document all her readings.  MEDICATION: Pt reports she is taking all her prescribed medications with enough refills and no delays. Issues resolved from last month with Cozaar as pt taking this medication as recommended.  NUTRITIONAL: Pt also expressed how she is reducing some of her dietary habits related to sodas and carbohydrates which have overall improved her daily readings over the last month.  Pt will continue to reduce her soda intake to improve her readings.   Objective:   Review of Systems  Constitutional: Negative.   HENT: Negative.   Eyes: Negative.   Respiratory: Negative.   Cardiovascular: Negative.   Gastrointestinal: Negative.   Genitourinary: Negative.   Musculoskeletal: Negative.   Skin: Negative.   Neurological: Negative.   Endo/Heme/Allergies: Negative.   Psychiatric/Behavioral: Negative.   All other systems reviewed and are negative.   Physical Exam  Constitutional: She is oriented to person, place, and time. She appears well-developed and well-nourished.  HENT:  Right Ear: External ear normal.  Left Ear: External ear normal.  Nose: Nose normal.  Neck: Normal range of motion.  Cardiovascular: Normal heart sounds.   Respiratory: Effort normal and  breath sounds normal.  GI: Soft. Bowel sounds are normal.  Musculoskeletal: Normal range of motion.  Neurological: She is alert and oriented to person, place, and time.  Skin: Skin is warm and dry.  Psychiatric: She has a normal mood and affect. Her behavior is normal. Judgment and thought content normal.    Current Medications:   Current Outpatient Prescriptions  Medication Sig Dispense Refill  . ACCU-CHEK FASTCLIX LANCETS MISC Check blood sugar 3x a day as instructed dx cod 250.00 insulin requiring 102 each 5  . Alcohol Swabs (ALCOHOL PREP) 70 % PADS     . amLODipine (NORVASC) 5 MG tablet TAKE 1 TABLET(5 MG) BY MOUTH DAILY 90 tablet 3  . aspirin 81 MG EC tablet TAKE 1 TABLET BY MOUTH EVERY DAY 30 tablet 0  . Blood Glucose Calibration (ACCU-CHEK SMARTVIEW CONTROL) LIQD     . Blood Glucose Monitoring Suppl (ACCU-CHEK AVIVA PLUS) W/DEVICE KIT Use to check blood sugar 3 to 4 times daily. diag code 250.62. Insulin dependent 1 kit 0  . citalopram (CELEXA) 40 MG tablet Take 40 mg by mouth daily.  2  . clonazePAM (KLONOPIN) 0.5 MG tablet Take 0.5 mg by mouth daily as needed for anxiety.     . diclofenac (VOLTAREN) 75 MG EC tablet Take 1 tablet (75 mg total) by mouth 2 (two) times daily as needed for moderate pain (with food). 180 tablet 0  . DULoxetine (CYMBALTA) 60 MG capsule Take 1 capsule (60 mg total) by mouth daily. 30 capsule 1  . exenatide (BYETTA 5 MCG PEN) 5 MCG/0.02ML SOPN injection Inject 0.02 mLs (5 mcg total) into  the skin 2 (two) times daily with a meal. 1.2 mL 3  . glucose blood (ACCU-CHEK AVIVA PLUS) test strip Use to test blood sugar 3 times daily. diag code E11.41. Insulin dependent 100 each 11  . Insulin Glargine (LANTUS SOLOSTAR) 100 UNIT/ML Solostar Pen Inject 40 Units into the skin at bedtime. 15 mL 3  . insulin lispro (HUMALOG KWIKPEN) 100 UNIT/ML KiwkPen Inject 8 units with lunch and 12 units with dinner. Eat meals within 10 minutes of injection. (Patient taking differently:  Inject 14 Units into the skin daily. ) 15 mL 3  . Insulin Pen Needle (VALUMARK PEN Holmes) 31G X 8 MM MISC 1 each by Does not apply route daily. 100 each 3  . Lancet Devices (ADJUSTABLE LANCING DEVICE) MISC     . loratadine (CLARITIN) 10 MG tablet Take 1 tablet (10 mg total) by mouth daily. (Patient not taking: Reported on 11/13/2015) 30 tablet 2  . losartan (COZAAR) 50 MG tablet Take 1 tablet (50 mg total) by mouth 2 (two) times daily. (Patient not taking: Reported on 11/13/2015) 180 tablet 1  . magnesium citrate SOLN Take 296 mLs (1 Bottle total) by mouth once. (Patient not taking: Reported on 11/13/2015) 195 mL 0  . meloxicam (MOBIC) 7.5 MG tablet Take 7.5 mg by mouth daily.    . metFORMIN (GLUCOPHAGE) 1000 MG tablet Take 1061m (1 tablets) 2 times a day. 60 tablet 11  . mirtazapine (REMERON) 30 MG tablet     . nicotine polacrilex (NICORETTE) 4 MG gum Take 1 each (4 mg total) by mouth as needed for smoking cessation. 100 tablet 0  . polyethylene glycol (MIRALAX / GLYCOLAX) packet Take 17 g by mouth daily. (Patient not taking: Reported on 11/13/2015) 14 each 0  . rosuvastatin (CRESTOR) 20 MG tablet Take 1 tablet (20 mg total) by mouth at bedtime. 30 tablet 11  . sodium chloride (OCEAN) 0.65 % SOLN nasal spray Place 2 sprays into both nostrils as needed for congestion. (Patient not taking: Reported on 11/13/2015) 60 mL 2  . tiZANidine (ZANAFLEX) 2 MG tablet Take 1 tablet (2 mg total) by mouth every 6 (six) hours as needed for muscle spasms. 10 tablet 0  . traZODone (DESYREL) 100 MG tablet Take 200 mg by mouth at bedtime.      No current facility-administered medications for this visit.    Functional Status:   In your present state of health, do you have any difficulty performing the following activities: 11/21/2015 11/18/2015  Hearing? N N  Vision? N N  Difficulty concentrating or making decisions? N N  Walking or climbing stairs? Y Y  Dressing or bathing? N N  Doing errands, shopping? N  N  Preparing Food and eating ? N -  Using the Toilet? N -  In the past six months, have you accidently leaked urine? N -  Do you have problems with loss of bowel control? N -  Managing your Medications? N -  Managing your Finances? N -  Housekeeping or managing your Housekeeping? N -    Fall/Depression Screening:    PHQ 2/9 Scores 11/21/2015 11/18/2015 11/13/2015 11/06/2015 10/15/2015 10/06/2015 09/29/2015  PHQ - 2 Score '1 1 2 ' 0 1 0 0  PHQ- 9 Score '7 3 8 ' - - - -  Exception Documentation - - - - - - -   Filed Vitals:   12/04/15 1151  BP: 130/82  Pulse: 64  Resp: 20    Assessment:   Ongoing case management related to  diabetes Follow up on medication issues related HTN medication Follow up on nutritional issue related to better eating habits.  Plan:  Physical assessment completed with no acute issues noted as pt progress fairly well with managing her diabetes. Praise pt for managing her care and improving her CBG over the last month with discussed interventions and plan of care. Visit with well with primary provider last week with no major issues discussed.  Will verify pt is taking her HTN and all other prescribed medications with no delays. Will verify pt has enough medication with no needed refills. Will encourage ongoing adherence. Will continue to encouraged healthy eating habits and continue to encouraged pt to eat small meals in between her heavier 3 meals a day to regulate her glucose levels.  Plan of care review and goals adjusted accordingly to allow adherence. Will follow up with a home visit in one month and re-evaluate plan of care and goals at this time. RN will continue to encourage pt adherence in managing her ongoing diabetes. No other inquires or issues to address at this time.   Raina Mina, RN Care Management Coordinator [facility] Main Office [phone]

## 2015-12-06 ENCOUNTER — Other Ambulatory Visit: Payer: Self-pay | Admitting: Internal Medicine

## 2015-12-16 ENCOUNTER — Encounter: Payer: Self-pay | Admitting: Internal Medicine

## 2015-12-16 ENCOUNTER — Other Ambulatory Visit: Payer: Self-pay | Admitting: Internal Medicine

## 2015-12-16 ENCOUNTER — Ambulatory Visit (INDEPENDENT_AMBULATORY_CARE_PROVIDER_SITE_OTHER): Payer: Medicare Other | Admitting: Internal Medicine

## 2015-12-16 VITALS — BP 126/90 | HR 81 | Temp 98.2°F | Wt 183.3 lb

## 2015-12-16 DIAGNOSIS — M25552 Pain in left hip: Secondary | ICD-10-CM

## 2015-12-16 DIAGNOSIS — R61 Generalized hyperhidrosis: Secondary | ICD-10-CM | POA: Diagnosis not present

## 2015-12-16 DIAGNOSIS — R238 Other skin changes: Secondary | ICD-10-CM | POA: Diagnosis not present

## 2015-12-16 DIAGNOSIS — L749 Eccrine sweat disorder, unspecified: Secondary | ICD-10-CM

## 2015-12-16 MED ORDER — TRAMADOL HCL 50 MG PO TABS: 50.0000 mg | ORAL_TABLET | Freq: Two times a day (BID) | ORAL | Status: DC | PRN

## 2015-12-16 NOTE — Assessment & Plan Note (Signed)
Assessment: Patient reports increased pain since visit on 11/22 with Dr. Marlowe Sax.  States increased Cymbalta 60mg  daily and Diclofenac 75mg  BID are not adequately controlling her pain.  Describes pain as radiating down entire left leg, worsened at night and with lying down.  States pain is dull, achy, and throbbing.  States the pain keeps her up at night.  She has previously been evaluated by [facility] and their interventions did not provide relief.  Patient has been unable to get MRI ordered by Dr. Marlowe Sax due to insurance complications.  Plan: - continue Cymbalta and Diclofenac.  Patient also with Meloxicam on her medication list that she believes she is also taking when queried about this.  I removed Meloxicam and instructed patient not to take while also taking Diclofenac - rx sent for Tramadol 50mg  BID q12h prn #20 tablets until patient able to get MRI in order for more definitive tx to be pursued as she has failed other more conservative measures (NSAIDs, topicals, Tylenol, Gabapentin).  According to prior notes, Tramadol also previously unsuccessful, however, I would be hesitant to escalate to more potent opiate therapy first before trying Tramadol again first. - consider orthopedic referral for evaluation as her symptoms do not seem to be improving on medical management pending MRI results

## 2015-12-16 NOTE — Patient Instructions (Signed)
Thank you for coming to see me today. It was a pleasure. Today we talked about:   Your hip pain: i have given you a prescription for short course of Tramadol to take until you can get your MRI.  We will work with your insurance company to try and get this approved.  Continue taking your Cymbalta and Diclofenac.  Do not take Meloxicam at this time  Your sweating: we are checking your TSH today.  Please also keep a log of your sweating episodes with what you are doing when this occurs and check your temperature.  Please follow-up with Dr. Berline Lopes in 1 month  If you have any questions or concerns, please do not hesitate to call the office at [phone].  Take Care,   Jule Ser, DO

## 2015-12-16 NOTE — Progress Notes (Signed)
Patient ID: MATTELYN IMHOFF, female   DOB: [birth year], 59 y.o.   MRN: [id number]   Subjective:   Patient ID: SKARLETT SEDLACEK female   DOB: April 07, 1956 59 y.o.   MRN: [id number]  HPI: Ms.Johnanna L Fuhs is a 59 y.o. female with past medical history as detailed below who presents to [facility] for follow up of left hip pain.  Please see A&P for status of patients chronic medical conditions.      Past Medical History  Diagnosis Date  . Hepatitis B infection pt unsure    resolved  . Diabetes mellitus, type II ([place])   . HTN (hypertension)   . Hyperlipidemia   . Congenital blindness     L eye  . Tobacco abuse     .5 PPD smoker  . Anxiety   . Depression   . Arthritis   . Abnormal stress test   . Glaucoma    Current Outpatient Prescriptions  Medication Sig Dispense Refill  . ACCU-CHEK FASTCLIX LANCETS MISC Check blood sugar 3x a day as instructed dx cod 250.00 insulin requiring 102 each 5  . Alcohol Swabs (ALCOHOL PREP) 70 % PADS     . amLODipine (NORVASC) 5 MG tablet TAKE 1 TABLET(5 MG) BY MOUTH DAILY 90 tablet 3  . aspirin 81 MG EC tablet TAKE 1 TABLET BY MOUTH EVERY DAY 30 tablet 0  . Blood Glucose Calibration (ACCU-CHEK SMARTVIEW CONTROL) LIQD     . Blood Glucose Monitoring Suppl (ACCU-CHEK AVIVA PLUS) W/DEVICE KIT Use to check blood sugar 3 to 4 times daily. diag code 250.62. Insulin dependent 1 kit 0  . citalopram (CELEXA) 40 MG tablet Take 40 mg by mouth daily.  2  . clonazePAM (KLONOPIN) 0.5 MG tablet Take 0.5 mg by mouth daily as needed for anxiety.     . diclofenac (VOLTAREN) 75 MG EC tablet Take 1 tablet (75 mg total) by mouth 2 (two) times daily as needed for moderate pain (with food). 180 tablet 0  . DULoxetine (CYMBALTA) 60 MG capsule Take 1 capsule (60 mg total) by mouth daily. 30 capsule 1  . exenatide (BYETTA 5 MCG PEN) 5 MCG/0.02ML SOPN injection Inject 0.02 mLs (5 mcg total) into the skin 2 (two) times daily with a meal. 1.2 mL 3  . glucose blood (ACCU-CHEK AVIVA PLUS) test  strip Use to test blood sugar 3 times daily. diag code E11.41. Insulin dependent 100 each 11  . Insulin Glargine (LANTUS SOLOSTAR) 100 UNIT/ML Solostar Pen Inject 40 Units into the skin at bedtime. 15 mL 3  . insulin lispro (HUMALOG KWIKPEN) 100 UNIT/ML KiwkPen Inject 8 units with lunch and 12 units with dinner. Eat meals within 10 minutes of injection. (Patient taking differently: Inject 14 Units into the skin daily. ) 15 mL 3  . Insulin Pen Needle (VALUMARK PEN NEEDLES) 31G X 8 MM MISC 1 each by Does not apply route daily. 100 each 3  . Lancet Devices (ADJUSTABLE LANCING DEVICE) MISC     . loratadine (CLARITIN) 10 MG tablet Take 1 tablet (10 mg total) by mouth daily. (Patient not taking: Reported on 11/13/2015) 30 tablet 2  . losartan (COZAAR) 50 MG tablet Take 1 tablet (50 mg total) by mouth 2 (two) times daily. (Patient not taking: Reported on 11/13/2015) 180 tablet 1  . magnesium citrate SOLN Take 296 mLs (1 Bottle total) by mouth once. (Patient not taking: Reported on 11/13/2015) 195 mL 0  . metFORMIN (GLUCOPHAGE) 1000 MG tablet Take 1042m (1 tablets)  2 times a day. 60 tablet 11  . mirtazapine (REMERON) 30 MG tablet     . nicotine polacrilex (NICORETTE) 4 MG gum Take 1 each (4 mg total) by mouth as needed for smoking cessation. 100 tablet 0  . polyethylene glycol (MIRALAX / GLYCOLAX) packet Take 17 g by mouth daily. (Patient not taking: Reported on 11/13/2015) 14 each 0  . rosuvastatin (CRESTOR) 20 MG tablet Take 1 tablet (20 mg total) by mouth at bedtime. 30 tablet 11  . sodium chloride (OCEAN) 0.65 % SOLN nasal spray Place 2 sprays into both nostrils as needed for congestion. (Patient not taking: Reported on 11/13/2015) 60 mL 2  . tiZANidine (ZANAFLEX) 2 MG tablet Take 1 tablet (2 mg total) by mouth every 6 (six) hours as needed for muscle spasms. 10 tablet 0  . traMADol (ULTRAM) 50 MG tablet Take 1 tablet (50 mg total) by mouth every 12 (twelve) hours as needed. 20 tablet 0  . traZODone  (DESYREL) 100 MG tablet Take 200 mg by mouth at bedtime.      No current facility-administered medications for this visit.   Family History  Problem Relation Age of Onset  . Heart disease Mother 72  . Hypertension Mother   . Heart disease Father 20  . Aneurysm Brother   . Aneurysm Sister   . Colon cancer Neg Hx   . Esophageal cancer Neg Hx   . Stomach cancer Neg Hx    Social History   Social History  . Marital Status: Divorced    Spouse Name: N/A  . Number of Children: 1  . Years of Education: 12   Occupational History  .  Unemployed   Social History Main Topics  . Smoking status: Current Every Day Smoker -- 0.40 packs/day for 20 years    Types: Cigarettes  . Smokeless tobacco: Current User    Types: Chew     Comment: Nicorette gum.  Marland Kitchen Alcohol Use: No  . Drug Use: No  . Sexual Activity: Not Currently    Birth Control/ Protection: Surgical   Other Topics Concern  . None   Social History Narrative   Review of Systems: Review of Systems  Constitutional: Negative for fever, chills and weight loss.  Eyes: Negative for blurred vision.  Respiratory: Negative for cough and shortness of breath.   Cardiovascular: Negative for chest pain, palpitations and leg swelling.  Gastrointestinal: Negative for nausea, vomiting, abdominal pain, diarrhea and constipation.  Genitourinary: Negative for dysuria.  Musculoskeletal: Positive for back pain and joint pain. Negative for myalgias and falls.  Skin: Negative for itching and rash.  Neurological: Negative for dizziness, loss of consciousness and headaches.  Psychiatric/Behavioral: Negative for depression.    Objective:  Physical Exam: Filed Vitals:   12/16/15 0949  BP: 126/90  Pulse: 81  Temp: 98.2 F (36.8 C)  TempSrc: Oral  Weight: 183 lb 4.8 oz (83.144 kg)  SpO2: 99%   Physical Exam  Constitutional: She is oriented to person, place, and time. She appears well-developed and well-nourished.  HENT:  Head:  Normocephalic and atraumatic.  Eyes: EOM are normal.  Neck: Normal range of motion. Neck supple. No thyromegaly present.  Cardiovascular: Normal rate and regular rhythm.   Pulmonary/Chest: Effort normal and breath sounds normal.  Abdominal: Soft. Bowel sounds are normal.  Musculoskeletal:  Straight leg raise positive on Left with pain noted in the anterior hip as well.  No lumbar tenderness to palpation Strength 5/5 Left and Right leg Sensation to light  touch intact bilaterally  Neurological: She is alert and oriented to person, place, and time.  Skin: Skin is warm and dry.  Psychiatric: She has a normal mood and affect.    Assessment & Plan:  Please see problem list for assessment and plan.  Case discussed with Dr. Daryll Drown.

## 2015-12-16 NOTE — Assessment & Plan Note (Addendum)
Assessment: Patient reports approximately 2 months of drenching episodes of sweating.  States episodes last 5 minutes and occur periodically throughout the day 3-4 times per day.  She reports no other associated symptoms.  She reports having gone through menopause 30 years ago and reports these episodes are "somewhat similar" to hot-flashes she experienced during menopause.  She denies symptoms of hyper or hypothyroidism including heat/cold intolerance, palpitations, constipation, diarrhea.  States her skin is no more dry than normal.  States her hair has not changed in character.  She reports no dysphagia and has no thyromegaly on physical exam.  She reports no other alarm symptoms of unexplained fever, chills, no weight loss, and episodes are not nocturnal nor related to her pain.  Plan: - differential includes malignancy (colonscopy 2014, prior surgical hx for abdominal hysterectomy, current smoker, no family history of cancer) however, no other symptoms associated and weight is stable, no fevers, no SOB, no night sweats, no abdominal complaints, no change in bowel habits. - differential also includes endocrine etiology so will check TSH - asked patient to keep log of activities and temperature of what is happening when having these episodes to help tease out the source as symptoms are vague at this point.

## 2015-12-17 LAB — TSH: TSH: 1.68 u[IU]/mL (ref 0.450–4.500)

## 2015-12-18 NOTE — Addendum Note (Signed)
Addended by: Gilles Chiquito B on: 12/18/2015 05:22 PM   Modules accepted: Level of Service

## 2015-12-18 NOTE — Progress Notes (Signed)
Internal Medicine Clinic Attending  I saw and evaluated the patient.  I personally confirmed the key portions of the history and exam documented by Dr. Wallace and I reviewed pertinent patient test results.  The assessment, diagnosis, and plan were formulated together and I agree with the documentation in the resident's note. 

## 2015-12-28 DIAGNOSIS — Z8673 Personal history of transient ischemic attack (TIA), and cerebral infarction without residual deficits: Secondary | ICD-10-CM

## 2015-12-28 HISTORY — PX: GLAUCOMA SURGERY: SHX656

## 2015-12-28 HISTORY — DX: Personal history of transient ischemic attack (TIA), and cerebral infarction without residual deficits: Z86.73

## 2015-12-30 ENCOUNTER — Encounter: Payer: Medicare Other | Admitting: Rehabilitation

## 2015-12-30 NOTE — Therapy (Signed)
[facility] [address] [place], [place], [address] Phone: [phone]   Fax:  [phone]  Patient Details  Name: Susan Holmes MRN: [id number] Date of Birth: 04-10-1956 Referring Provider:  No ref. provider found  Encounter Date: 12/30/2015   PHYSICAL THERAPY DISCHARGE SUMMARY  Visits from Start of Care: 1  Current functional level related to goals / functional outcomes: PT Short Term Goals - 11/11/15 1422    PT SHORT TERM GOAL #1   Title Pt will be independent with HEP for L hip strengthening and stretching to improve functional mobility. (Target Date: 12/09/15)   PT SHORT TERM GOAL #2   Title Pt will report no more than 2 point increase in L hip pain during therapy to indicate decreased pain as limiting factor in mobility. (Target Date: 12/09/15)   PT SHORT TERM GOAL #3   Title Pt will tolerate >5 mins of ambulation at mod I level with no more than 2 point increase in pain to indicate improvement on functional endurance in relation to pain. (Target Date: 12/09/15)   PT SHORT TERM GOAL #4   Title Pt will report initiation of community fitness program to continue her overall fitness and strengthening. (Target Date: 12/09/15)            Remaining deficits: Unsure as pt was referred to [facility]. OP PT for more specialized ortho treatment, however she did not return.     Education / Equipment: Recommendation to transfer to [facility] OP PT  Plan: Patient agrees to discharge.  Patient goals were not met. Patient is being discharged due to not returning since the last visit.  ?????            Cameron Sprang, PT, MPT [facility] [address] [place], [place], [address] Phone: [phone]   Fax:  [phone] 12/30/2015, 9:32 AM

## 2015-12-31 ENCOUNTER — Ambulatory Visit (HOSPITAL_COMMUNITY)
Admission: RE | Admit: 2015-12-31 | Discharge: 2015-12-31 | Disposition: A | Discharge status: Home / Self Care | Payer: Medicare Other | Source: Ambulatory Visit | Attending: Internal Medicine | Admitting: Internal Medicine

## 2015-12-31 DIAGNOSIS — M5136 Other intervertebral disc degeneration, lumbar region: Secondary | ICD-10-CM | POA: Diagnosis not present

## 2015-12-31 DIAGNOSIS — R200 Anesthesia of skin: Secondary | ICD-10-CM | POA: Diagnosis not present

## 2015-12-31 DIAGNOSIS — R531 Weakness: Secondary | ICD-10-CM | POA: Insufficient documentation

## 2015-12-31 DIAGNOSIS — R262 Difficulty in walking, not elsewhere classified: Secondary | ICD-10-CM | POA: Diagnosis not present

## 2015-12-31 DIAGNOSIS — M25552 Pain in left hip: Secondary | ICD-10-CM | POA: Diagnosis not present

## 2016-01-02 NOTE — Progress Notes (Unsigned)
Patient ID: Susan Holmes, female   DOB: 23-Dec-1956, 60 y.o.   MRN: [id number] Collaborating with CDE on DM medication management. [place] patient uses and found that the last Lispro refill was on 10/06/15 for one box. Last glargine refill was 09/29/15 for one box.

## 2016-01-07 ENCOUNTER — Ambulatory Visit: Payer: Medicare Other | Admitting: *Deleted

## 2016-01-09 ENCOUNTER — Ambulatory Visit: Payer: Self-pay | Admitting: *Deleted

## 2016-01-09 DIAGNOSIS — Z961 Presence of intraocular lens: Secondary | ICD-10-CM | POA: Diagnosis not present

## 2016-01-09 DIAGNOSIS — H401133 Primary open-angle glaucoma, bilateral, severe stage: Secondary | ICD-10-CM | POA: Diagnosis not present

## 2016-01-13 ENCOUNTER — Other Ambulatory Visit: Payer: Self-pay | Admitting: *Deleted

## 2016-01-13 NOTE — Patient Outreach (Signed)
Susan Holmes. [facility]) Care Management   01/13/2016  Susan Holmes 1956-04-01 [id number]  Susan Holmes is an 60 y.o. female  Subjective:  DM: Pt repots her readings and providers and glucometer with the overall readings noted today. Pt reports she has some improvement since RN last visit last month from 400 to 300 this month but they are "still high". Pt able to recite some of the signs and symptom so low and elevated BS and aware of some of the food items that can affect her readings. Pt unable to provide the documentation of all her readings and confirms her glucose date and time is not correct but confirms the readings are correct but may  Not be on the date and time indicated.  MEDICATION: Pt reports she has been administering her insulin after her meals and only eats really one meal daily. Pt states she was unaware to take her insulin prior to her meals after her CBGs but will start due to the discussion today. Reports all other medications are adminstered correctly with no problems. STRESS: Pt reports she is a little stressed with family issues this month (refused counseling). NUTRITION: Pt also reports she usually eats one meal a day and did not know the importance of eating three meals a day and how this could affect her overall BS. Pt states she "eats what"s in her cabinet". States she visits the food shelter truck when needed and aware of other food shelters.   Objective:   Review of Systems  Constitutional: Negative.   HENT: Negative.   Eyes: Negative.   Respiratory: Positive for cough.        Productive cough with clear sputum.  Cardiovascular: Negative.   Gastrointestinal: Negative.   Genitourinary: Negative.   Musculoskeletal: Negative.   Skin: Negative.   Neurological: Negative.   Endo/Heme/Allergies: Negative.   Psychiatric/Behavioral: Negative.     Physical Exam  Constitutional: She is oriented to person, place, and time. She appears well-developed and  well-nourished.  HENT:  Right Ear: External ear normal.  Left Ear: External ear normal.  Nose: Nose normal.  Eyes: EOM are normal.  Neck: Normal range of motion.  Cardiovascular: Normal heart sounds.   Respiratory: Effort normal and breath sounds normal.  GI: Soft. Bowel sounds are normal.  Musculoskeletal: Normal range of motion.  Neurological: She is alert and oriented to person, place, and time.  Skin: Skin is warm and dry.  Psychiatric: She has a normal mood and affect. Her behavior is normal. Judgment and thought content normal.    Current Medications:   Current Outpatient Prescriptions  Medication Sig Dispense Refill  . ACCU-CHEK FASTCLIX LANCETS MISC Check blood sugar 3x a day as instructed dx cod 250.00 insulin requiring 102 each 5  . Alcohol Swabs (ALCOHOL PREP) 70 % PADS     . amLODipine (NORVASC) 5 MG tablet TAKE 1 TABLET(5 MG) BY MOUTH DAILY 90 tablet 3  . aspirin 81 MG EC tablet TAKE 1 TABLET BY MOUTH EVERY DAY 30 tablet 0  . Blood Glucose Calibration (ACCU-CHEK SMARTVIEW CONTROL) LIQD     . Blood Glucose Monitoring Suppl (ACCU-CHEK AVIVA PLUS) W/DEVICE KIT Use to check blood sugar 3 to 4 times daily. diag code 250.62. Insulin dependent 1 kit 0  . citalopram (CELEXA) 40 MG tablet Take 40 mg by mouth daily.  2  . clonazePAM (KLONOPIN) 0.5 MG tablet Take 0.5 mg by mouth daily as needed for anxiety.     . diclofenac (VOLTAREN) 75  MG EC tablet Take 1 tablet (75 mg total) by mouth 2 (two) times daily as needed for moderate pain (with food). 180 tablet 0  . DULoxetine (CYMBALTA) 60 MG capsule Take 1 capsule (60 mg total) by mouth daily. 30 capsule 1  . exenatide (BYETTA 5 MCG PEN) 5 MCG/0.02ML SOPN injection Inject 0.02 mLs (5 mcg total) into the skin 2 (two) times daily with a meal. 1.2 mL 3  . glucose blood (ACCU-CHEK AVIVA PLUS) test strip Use to test blood sugar 3 times daily. diag code E11.41. Insulin dependent 100 each 11  . Insulin Glargine (LANTUS SOLOSTAR) 100 UNIT/ML  Solostar Pen Inject 40 Units into the skin at bedtime. 15 mL 3  . insulin lispro (HUMALOG KWIKPEN) 100 UNIT/ML KiwkPen Inject 8 units with lunch and 12 units with dinner. Eat meals within 10 minutes of injection. (Patient taking differently: Inject 14 Units into the skin daily. ) 15 mL 3  . Insulin Pen Needle (VALUMARK PEN NEEDLES) 31G X 8 MM MISC 1 each by Does not apply route daily. 100 each 3  . Lancet Devices (ADJUSTABLE LANCING DEVICE) MISC     . loratadine (CLARITIN) 10 MG tablet Take 1 tablet (10 mg total) by mouth daily. 30 tablet 2  . losartan (COZAAR) 50 MG tablet Take 1 tablet (50 mg total) by mouth 2 (two) times daily. 180 tablet 1  . magnesium citrate SOLN Take 296 mLs (1 Bottle total) by mouth once. 195 mL 0  . metFORMIN (GLUCOPHAGE) 1000 MG tablet Take 1050m (1 tablets) 2 times a day. 60 tablet 11  . mirtazapine (REMERON) 30 MG tablet     . rosuvastatin (CRESTOR) 20 MG tablet Take 1 tablet (20 mg total) by mouth at bedtime. 30 tablet 11  . sodium chloride (OCEAN) 0.65 % SOLN nasal spray Place 2 sprays into both nostrils as needed for congestion. 60 mL 2  . tiZANidine (ZANAFLEX) 2 MG tablet Take 1 tablet (2 mg total) by mouth every 6 (six) hours as needed for muscle spasms. 10 tablet 0  . traMADol (ULTRAM) 50 MG tablet Take 1 tablet (50 mg total) by mouth every 12 (twelve) hours as needed. 20 tablet 0  . traZODone (DESYREL) 100 MG tablet Take 200 mg by mouth at bedtime.     . nicotine polacrilex (NICORETTE) 4 MG gum Take 1 each (4 mg total) by mouth as needed for smoking cessation. (Patient not taking: Reported on 01/13/2016) 100 tablet 0  . polyethylene glycol (MIRALAX / GLYCOLAX) packet Take 17 g by mouth daily. (Patient not taking: Reported on 11/13/2015) 14 each 0   No current facility-administered medications for this visit.    Functional Status:   In your present state of health, do you have any difficulty performing the following activities: 12/16/2015 11/21/2015  Hearing?  N N  Vision? Y N  Difficulty concentrating or making decisions? N N  Walking or climbing stairs? Y Y  Dressing or bathing? N N  Doing errands, shopping? N N  Preparing Food and eating ? - N  Using the Toilet? - N  In the past six months, have you accidently leaked urine? - N  Do you have problems with loss of bowel control? - N  Managing your Medications? - N  Managing your Finances? - N  Housekeeping or managing your Housekeeping? - N    Fall/Depression Screening:    PHQ 2/9 Scores 12/16/2015 11/21/2015 11/18/2015 11/13/2015 11/06/2015 10/15/2015 10/06/2015  PHQ - 2 Score 1 1 1  2 0 1 0  PHQ- 9 Score - '7 3 8 ' - - -  Exception Documentation - - - - - - -   Filed Vitals:   01/13/16 1020  BP: 124/80  Pulse: 84  Resp: 20    Assessment:   Ongoing follow up related to DM management Non-adherence with medication regimen (insulin) Stress related to family dynamics Nutritional issues related to limited meals  Plan:  Physical assessment completed with noted interventions and findings today.  Will discuss pt's issues over the lat month and any changes that has assisted her in managing her diabetes. Pt reports her readings with some improvement however her BS remain elevated. RN will inquire further on the changes and routine in her daily monitoring that has changed that maybe affecting her overall readings. Will continue to education pt on hyperglycemia and if not controlled the risk of involved concerning her health. Based upon the readings reported today RN offered to assist with a referral to a nutritionist if she felt a discussion on specific food items or structured dietary measure would assist (pt declined). Will also inquire on how pt is administrating her insulin. Once this information was obtained RN educated pt on how to administer her insulin based upon the recommendations on the prescriptions "before meals" Will discuss in detail pt's insulin regimen and when she should  administer her insulin based upon the prescriptions recommendations. Pt indicated she would start administering the insulin prior to her meals instead of after her meals which has been an indicator of her elevated readings. RN will follow up accordingly with pt's change in how she is administering her insulin. Will further discuss pt's family dynamics and offer counseling and/or resources if needed (pt declined).  Strongly encouraged pt to reduce her stress levels to avoid increasing her BP and glucose readings.  Will discuss available resources concerning food sources in the community (food shelter) however pt aware and visits as needed. Will continue to encouraged healthy eating habits and the importance of consuming three meals and two to three snacks throughout the day for regulating her diabetes. Will discuss the risk of not eating healthy and enough food source that may affect her diabetes and overall blood glucose levels (pt with understanding).  Plan of care discussed and goal adjusted according to pt's progress and needs at this time. Will review and re-evaluate pt's plan of care in few weeks. No other inquires or request at this time.  Raina Mina, RN Care Management Coordinator [place] Office [phone]

## 2016-01-14 ENCOUNTER — Telehealth: Payer: Self-pay | Admitting: Internal Medicine

## 2016-01-14 NOTE — Telephone Encounter (Signed)
Pt requesting xray result. Please call pt back. °

## 2016-01-15 NOTE — Telephone Encounter (Signed)
Theodoro Parma,  I got this message routed to me regarding imaging results for your patient that you ordered.  Could you give them a call back when you can?    Thanks, Mitzi Hansen

## 2016-01-16 NOTE — Telephone Encounter (Signed)
Please review and call pt

## 2016-01-16 NOTE — Telephone Encounter (Signed)
Discussed hip MRI results with the patient. Advised her to return to the clinic if she continues to have pain.

## 2016-02-12 ENCOUNTER — Encounter (HOSPITAL_COMMUNITY): Payer: Self-pay

## 2016-02-12 ENCOUNTER — Ambulatory Visit: Payer: Self-pay | Admitting: *Deleted

## 2016-02-12 ENCOUNTER — Emergency Department (HOSPITAL_COMMUNITY): Payer: Medicare Other

## 2016-02-12 ENCOUNTER — Emergency Department (HOSPITAL_COMMUNITY)
Admission: EM | Admit: 2016-02-12 | Discharge: 2016-02-12 | Disposition: A | Discharge status: Home / Self Care | Payer: Medicare Other | Attending: Emergency Medicine | Admitting: Emergency Medicine

## 2016-02-12 DIAGNOSIS — Z79899 Other long term (current) drug therapy: Secondary | ICD-10-CM | POA: Insufficient documentation

## 2016-02-12 DIAGNOSIS — R0981 Nasal congestion: Secondary | ICD-10-CM

## 2016-02-12 DIAGNOSIS — M1990 Unspecified osteoarthritis, unspecified site: Secondary | ICD-10-CM | POA: Insufficient documentation

## 2016-02-12 DIAGNOSIS — R0602 Shortness of breath: Secondary | ICD-10-CM | POA: Diagnosis not present

## 2016-02-12 DIAGNOSIS — Z794 Long term (current) use of insulin: Secondary | ICD-10-CM | POA: Diagnosis not present

## 2016-02-12 DIAGNOSIS — I10 Essential (primary) hypertension: Secondary | ICD-10-CM | POA: Insufficient documentation

## 2016-02-12 DIAGNOSIS — R05 Cough: Secondary | ICD-10-CM | POA: Diagnosis not present

## 2016-02-12 DIAGNOSIS — F17210 Nicotine dependence, cigarettes, uncomplicated: Secondary | ICD-10-CM | POA: Diagnosis not present

## 2016-02-12 DIAGNOSIS — R059 Cough, unspecified: Secondary | ICD-10-CM

## 2016-02-12 DIAGNOSIS — H5442 Blindness, left eye, normal vision right eye: Secondary | ICD-10-CM | POA: Insufficient documentation

## 2016-02-12 DIAGNOSIS — E785 Hyperlipidemia, unspecified: Secondary | ICD-10-CM | POA: Insufficient documentation

## 2016-02-12 DIAGNOSIS — Z8619 Personal history of other infectious and parasitic diseases: Secondary | ICD-10-CM | POA: Insufficient documentation

## 2016-02-12 DIAGNOSIS — Z7984 Long term (current) use of oral hypoglycemic drugs: Secondary | ICD-10-CM | POA: Diagnosis not present

## 2016-02-12 DIAGNOSIS — Z9889 Other specified postprocedural states: Secondary | ICD-10-CM | POA: Diagnosis not present

## 2016-02-12 DIAGNOSIS — F329 Major depressive disorder, single episode, unspecified: Secondary | ICD-10-CM | POA: Diagnosis not present

## 2016-02-12 DIAGNOSIS — E119 Type 2 diabetes mellitus without complications: Secondary | ICD-10-CM | POA: Insufficient documentation

## 2016-02-12 DIAGNOSIS — R0789 Other chest pain: Secondary | ICD-10-CM

## 2016-02-12 DIAGNOSIS — J3489 Other specified disorders of nose and nasal sinuses: Secondary | ICD-10-CM | POA: Insufficient documentation

## 2016-02-12 DIAGNOSIS — Z7982 Long term (current) use of aspirin: Secondary | ICD-10-CM | POA: Insufficient documentation

## 2016-02-12 DIAGNOSIS — F419 Anxiety disorder, unspecified: Secondary | ICD-10-CM | POA: Diagnosis not present

## 2016-02-12 MED ORDER — AZITHROMYCIN 250 MG PO TABS
500.0000 mg | ORAL_TABLET | Freq: Once | ORAL | Status: AC
  Administered 2016-02-12: 500 mg via ORAL
  Filled 2016-02-12: qty 2

## 2016-02-12 MED ORDER — BENZONATATE 100 MG PO CAPS
100.0000 mg | ORAL_CAPSULE | Freq: Once | ORAL | Status: AC
  Administered 2016-02-12: 100 mg via ORAL
  Filled 2016-02-12: qty 1

## 2016-02-12 MED ORDER — BENZONATATE 100 MG PO CAPS: 100.0000 mg | ORAL_CAPSULE | Freq: Three times a day (TID) | ORAL | Status: DC

## 2016-02-12 MED ORDER — IBUPROFEN 600 MG PO TABS: 600.0000 mg | ORAL_TABLET | Freq: Four times a day (QID) | ORAL | Status: DC | PRN

## 2016-02-12 MED ORDER — AZITHROMYCIN 250 MG PO TABS: 250.0000 mg | ORAL_TABLET | Freq: Every day | ORAL | Status: DC

## 2016-02-12 MED ORDER — IBUPROFEN 200 MG PO TABS
600.0000 mg | ORAL_TABLET | Freq: Once | ORAL | Status: AC
  Administered 2016-02-12: 600 mg via ORAL
  Filled 2016-02-12: qty 3

## 2016-02-12 NOTE — ED Provider Notes (Signed)
CSN: [id number]     Arrival date & time 02/12/16  1004 History   First MD Initiated Contact with Patient 02/12/16 1108     Chief Complaint  Patient presents with  . Cough     (Consider location/radiation/quality/duration/timing/severity/associated sxs/prior Treatment) HPI   Susan Holmes is a 60 y.o. female, with a history of hypertension, DM, and anxiety, presenting to the ED with intermittently productive cough, sinus congestion, and chest soreness for the last 2 weeks. Patient states that her chest soreness increases with coughing and palpation. Patient is a current 0.5 pack per day smoker. Patient rates her chest soreness and a 2 out of 10, nonradiating.She denies fever/chills, nausea/vomiting, dizziness, shortness of breath, or any other complaints.    Past Medical History  Diagnosis Date  . Hepatitis B infection pt unsure    resolved  . Diabetes mellitus, type II ([place])   . HTN (hypertension)   . Hyperlipidemia   . Congenital blindness     L eye  . Tobacco abuse     .5 PPD smoker  . Anxiety   . Depression   . Arthritis   . Abnormal stress test   . Glaucoma    Past Surgical History  Procedure Laterality Date  . Abdominal hysterectomy    . Left heart catheterization with coronary angiogram N/A 10/09/2014    Procedure: LEFT HEART CATHETERIZATION WITH CORONARY ANGIOGRAM;  Surgeon: Troy Sine, MD;  Location: [facility] CATH LAB;  Service: Cardiovascular;  Laterality: N/A;   Family History  Problem Relation Age of Onset  . Heart disease Mother 51  . Hypertension Mother   . Heart disease Father 4  . Aneurysm Brother   . Aneurysm Sister   . Colon cancer Neg Hx   . Esophageal cancer Neg Hx   . Stomach cancer Neg Hx    Social History  Substance Use Topics  . Smoking status: Current Every Day Smoker -- 0.40 packs/day for 20 years    Types: Cigarettes  . Smokeless tobacco: Current User    Types: Chew     Comment: Nicorette gum.  Marland Kitchen Alcohol Use: No   OB History    No  data available     Review of Systems  Constitutional: Negative for fever, chills and diaphoresis.  HENT: Positive for congestion.   Respiratory: Positive for cough. Negative for shortness of breath.   Gastrointestinal: Negative for nausea, vomiting and abdominal pain.  Neurological: Negative for dizziness and light-headedness.  All other systems reviewed and are negative.     Allergies  Ace inhibitors  Home Medications   Prior to Admission medications   Medication Sig Start Date End Date Taking? Authorizing Provider  ACCU-CHEK FASTCLIX LANCETS MISC Check blood sugar 3x a day as instructed dx cod 250.00 insulin requiring 12/04/14   Otho Bellows, MD  Alcohol Swabs (ALCOHOL PREP) 70 % PADS  05/30/15   Historical Provider, MD  amLODipine (NORVASC) 5 MG tablet TAKE 1 TABLET(5 MG) BY MOUTH DAILY 10/10/15   Shela Leff, MD  aspirin 81 MG EC tablet TAKE 1 TABLET BY MOUTH EVERY DAY 03/10/15   Hoyt Koch, MD  azithromycin (ZITHROMAX) 250 MG tablet Take 1 tablet (250 mg total) by mouth daily. 02/13/16   Shawn C Joy, PA-C  benzonatate (TESSALON) 100 MG capsule Take 1 capsule (100 mg total) by mouth every 8 (eight) hours. 02/12/16   Shawn C Joy, PA-C  Blood Glucose Calibration (ACCU-CHEK SMARTVIEW CONTROL) LIQD  05/30/15   Historical Provider,  MD  Blood Glucose Monitoring Suppl (ACCU-CHEK AVIVA PLUS) W/DEVICE KIT Use to check blood sugar 3 to 4 times daily. diag code 250.62. Insulin dependent 09/11/14   Aldine Contes, MD  citalopram (CELEXA) 40 MG tablet Take 40 mg by mouth daily. 09/09/15   Historical Provider, MD  clonazePAM (KLONOPIN) 0.5 MG tablet Take 0.5 mg by mouth daily as needed for anxiety.  05/31/15   Historical Provider, MD  diclofenac (VOLTAREN) 75 MG EC tablet Take 1 tablet (75 mg total) by mouth 2 (two) times daily as needed for moderate pain (with food). 09/29/15   Shela Leff, MD  DULoxetine (CYMBALTA) 60 MG capsule Take 1 capsule (60 mg total) by mouth daily.  11/18/15 11/17/16  Shela Leff, MD  exenatide (BYETTA 5 MCG PEN) 5 MCG/0.02ML SOPN injection Inject 0.02 mLs (5 mcg total) into the skin 2 (two) times daily with a meal. 11/06/15 11/05/16  Burgess Estelle, MD  glucose blood (ACCU-CHEK AVIVA PLUS) test strip Use to test blood sugar 3 times daily. diag code E11.41. Insulin dependent 12/11/14   Otho Bellows, MD  ibuprofen (ADVIL,MOTRIN) 600 MG tablet Take 1 tablet (600 mg total) by mouth every 6 (six) hours as needed. 02/12/16   Shawn C Joy, PA-C  Insulin Glargine (LANTUS SOLOSTAR) 100 UNIT/ML Solostar Pen Inject 40 Units into the skin at bedtime. 09/29/15   Shela Leff, MD  insulin lispro (HUMALOG KWIKPEN) 100 UNIT/ML KiwkPen Inject 8 units with lunch and 12 units with dinner. Eat meals within 10 minutes of injection. Patient taking differently: Inject 14 Units into the skin daily.  10/06/15   Shela Leff, MD  Insulin Pen Needle (VALUMARK PEN NEEDLES) 31G X 8 MM MISC 1 each by Does not apply route daily. 11/20/14   Francesca Oman, DO  Lancet Devices (ADJUSTABLE LANCING DEVICE) [place]  03/17/15   Historical Provider, MD  loratadine (CLARITIN) 10 MG tablet Take 1 tablet (10 mg total) by mouth daily. 04/21/15   Nino Glow McLean-Scocozza, MD  losartan (COZAAR) 50 MG tablet Take 1 tablet (50 mg total) by mouth 2 (two) times daily. 03/31/15   Lucious Groves, DO  magnesium citrate SOLN Take 296 mLs (1 Bottle total) by mouth once. 06/04/15   Karlene Einstein, MD  metFORMIN (GLUCOPHAGE) 1000 MG tablet Take '1000mg'$  (1 tablets) 2 times a day. 02/26/15   Otho Bellows, MD  mirtazapine (REMERON) 30 MG tablet  04/02/15   Historical Provider, MD  rosuvastatin (CRESTOR) 20 MG tablet Take 1 tablet (20 mg total) by mouth at bedtime. 03/20/15 03/19/16  Karlene Einstein, MD  sodium chloride (OCEAN) 0.65 % SOLN nasal spray Place 2 sprays into both nostrils as needed for congestion. 04/21/15   Nino Glow McLean-Scocozza, MD  tiZANidine (ZANAFLEX) 2 MG tablet Take 1 tablet (2 mg  total) by mouth every 6 (six) hours as needed for muscle spasms. 11/02/15   Comer Locket, PA-C  traMADol (ULTRAM) 50 MG tablet Take 1 tablet (50 mg total) by mouth every 12 (twelve) hours as needed. 12/16/15 12/15/16  Jule Ser, DO  traZODone (DESYREL) 100 MG tablet Take 200 mg by mouth at bedtime.     Historical Provider, MD   BP 149/96 mmHg  Pulse 87  Temp(Src) 97.8 F (36.6 C) (Oral)  Resp 20  SpO2 99% Physical Exam  Constitutional: She appears well-developed and well-nourished. No distress.  HENT:  Head: Normocephalic and atraumatic.  Eyes: Conjunctivae are normal. Pupils are equal, round, and reactive to light.  Neck:  Neck supple.  Cardiovascular: Normal rate, regular rhythm, normal heart sounds and intact distal pulses.   Pulmonary/Chest: Effort normal and breath sounds normal. No respiratory distress. She exhibits tenderness. She exhibits no crepitus, no edema and no deformity.    Abdominal: Soft. Bowel sounds are normal. There is no tenderness. There is no guarding.  Musculoskeletal: She exhibits no edema or tenderness.  Lymphadenopathy:    She has no cervical adenopathy.  Neurological: She is alert.  Skin: Skin is warm and dry. She is not diaphoretic.  Nursing note and vitals reviewed.   ED Course  Procedures (including critical care time) Labs Review Labs Reviewed - No data to display  Imaging Review Dg Chest 2 View  02/12/2016  CLINICAL DATA:  Cough, congestion and shortness of breath. Mid upper chest pain for 1-2 weeks. EXAM: CHEST  2 VIEW COMPARISON:  02/10/2014 FINDINGS: The heart size and mediastinal contours are within normal limits. Both lungs are clear. The visualized skeletal structures are unremarkable. IMPRESSION: No active cardiopulmonary disease. Electronically Signed   By: Markus Daft M.D.   On: 02/12/2016 10:40   I have personally reviewed and evaluated these images as part of my medical decision-making.   EKG Interpretation None      MDM     Final diagnoses:  Cough  Chest wall pain  Sinus congestion    [name] presents with cough, congestion, and chest soreness for the last 2 weeks.  Suspect that the source of the patient's symptoms is a virus, however, due to the duration of symptoms patient qualifies for a diagnosis of bronchitis versus clinical pneumonia. Chest x-ray free from abnormalities. Patient is nontoxic appearing, afebrile, not tachycardic, not tachypneic, maintains SPO2 of 99% on room air, and is in no apparent distress. Patient has no signs of sepsis or other serious or life-threatening condition. The patient was given instructions for home care as well as return precautions. Patient voices understanding of these instructions, accepts the plan, and is comfortable with discharge.   Lorayne Bender, PA-C 02/12/16 1139  Leo Grosser, MD 02/12/16 Karl Bales

## 2016-02-12 NOTE — Discharge Instructions (Signed)
You have been seen today for cough and congestion. Your imaging showed no abnormalities. Follow up with PCP as needed or if symptoms do not improve. Return to ED should symptoms worsen. Please take all of your antibiotics until finished!   You may develop abdominal discomfort or diarrhea from the antibiotic.  You may help offset this with probiotics which you can buy or get in yogurt. Do not eat or take the probiotics until 2 hours after your antibiotic.

## 2016-02-12 NOTE — ED Notes (Signed)
Pt states cough x 2 weeks.  Chest pain with mucous only when coughing.  No fever.  Sinus congestion present

## 2016-02-13 ENCOUNTER — Other Ambulatory Visit: Payer: Self-pay | Admitting: *Deleted

## 2016-02-13 ENCOUNTER — Encounter: Payer: Self-pay | Admitting: *Deleted

## 2016-02-13 NOTE — Patient Outreach (Signed)
[name]) Care Management  02/13/2016  Susan Holmes 28-Sep-1956 [id number]  RN spoke with pt who indicated she visited the ED at [facility] due to respiratory infection. States she has all her prescription medication and understand the purpose. RN inquired on another home visit however pt feels she is doing well enough to manager her care independently. RN reviewed pt's plan of care and goals all successful met however pt continues to report her BS are around the 200 range and no labs have been updated on pt's A1C however pt feels she has done a lot better and feels it will be less then the previous drawing.  RN reiterated on the importance of dietary measures and reducing carbohydrates resulting in lower BS (pt verbalized an understanding). Pt has also declined health coaching and again expressed she is able to manage her care independently with no additional follow needed at this time. RN will close this case and update her primary provider accordingly. No additional request or inquires at this time.   Raina Mina, RN Care Management Coordinator [place] Office [phone]

## 2016-02-13 NOTE — Patient Outreach (Signed)
[facility]) Care Management  02/12/2016  AKESHA TRACE 15-Jan-1956 [id number]  Upon arrival to pt's home today "NO SHOW". RN attempted to contact pt and left a HIPAA approved voice message requesting a call back to reschedule. RN also left a card on the residence home concerning the missed appointments. Will follow up accordingly with this pt for rescheduled if needed.   Raina Mina, RN Care Management Coordinator [place] Office [phone]

## 2016-02-16 NOTE — Progress Notes (Signed)
This encounter was created in error - please disregard.

## 2016-03-05 ENCOUNTER — Other Ambulatory Visit: Payer: Self-pay | Admitting: *Deleted

## 2016-03-05 MED ORDER — GLUCOSE BLOOD VI STRP: ORAL_STRIP | Status: DC

## 2016-03-05 NOTE — Telephone Encounter (Signed)
Last appt 12/16/15 -saw Dr Juleen China. Next appt 04/08/16 w/Dr Marlowe Sax.

## 2016-03-22 ENCOUNTER — Encounter: Payer: Self-pay | Admitting: *Deleted

## 2016-03-26 ENCOUNTER — Other Ambulatory Visit: Payer: Self-pay | Admitting: *Deleted

## 2016-03-26 MED ORDER — METFORMIN HCL 1000 MG PO TABS: ORAL_TABLET | ORAL | Status: DC

## 2016-03-26 NOTE — Telephone Encounter (Signed)
Last seen 12/16/15. Has f/u appt 05/04/16.

## 2016-03-28 ENCOUNTER — Other Ambulatory Visit: Payer: Self-pay | Admitting: Internal Medicine

## 2016-04-08 ENCOUNTER — Encounter: Payer: Medicare Other | Admitting: Internal Medicine

## 2016-04-08 ENCOUNTER — Ambulatory Visit: Payer: Medicare Other | Admitting: Pharmacist

## 2016-05-04 ENCOUNTER — Ambulatory Visit: Payer: Medicare Other | Admitting: Pharmacist

## 2016-05-04 ENCOUNTER — Ambulatory Visit (INDEPENDENT_AMBULATORY_CARE_PROVIDER_SITE_OTHER): Payer: Medicare Other | Admitting: Internal Medicine

## 2016-05-04 ENCOUNTER — Encounter: Payer: Self-pay | Admitting: Internal Medicine

## 2016-05-04 VITALS — BP 124/78 | HR 94 | Temp 98.3°F | Ht 67.0 in | Wt 185.1 lb

## 2016-05-04 DIAGNOSIS — E1165 Type 2 diabetes mellitus with hyperglycemia: Secondary | ICD-10-CM | POA: Diagnosis not present

## 2016-05-04 DIAGNOSIS — R0789 Other chest pain: Secondary | ICD-10-CM

## 2016-05-04 DIAGNOSIS — E1149 Type 2 diabetes mellitus with other diabetic neurological complication: Secondary | ICD-10-CM | POA: Diagnosis not present

## 2016-05-04 DIAGNOSIS — Z794 Long term (current) use of insulin: Secondary | ICD-10-CM | POA: Diagnosis not present

## 2016-05-04 DIAGNOSIS — I10 Essential (primary) hypertension: Secondary | ICD-10-CM | POA: Diagnosis not present

## 2016-05-04 DIAGNOSIS — L749 Eccrine sweat disorder, unspecified: Secondary | ICD-10-CM

## 2016-05-04 DIAGNOSIS — L748 Other eccrine sweat disorders: Secondary | ICD-10-CM

## 2016-05-04 DIAGNOSIS — Z79899 Other long term (current) drug therapy: Secondary | ICD-10-CM

## 2016-05-04 DIAGNOSIS — R079 Chest pain, unspecified: Secondary | ICD-10-CM

## 2016-05-04 DIAGNOSIS — IMO0002 Reserved for concepts with insufficient information to code with codable children: Secondary | ICD-10-CM

## 2016-05-04 LAB — POCT GLYCOSYLATED HEMOGLOBIN (HGB A1C): Hemoglobin A1C: 13.4

## 2016-05-04 LAB — GLUCOSE, CAPILLARY: Glucose-Capillary: 221 mg/dL — ABNORMAL HIGH (ref 65–99)

## 2016-05-04 MED ORDER — EXENATIDE 10 MCG/0.04ML ~~LOC~~ SOPN: 10.0000 ug | PEN_INJECTOR | Freq: Two times a day (BID) | SUBCUTANEOUS | Status: DC

## 2016-05-04 NOTE — Progress Notes (Signed)
Medication Samples have been provided to the patient.  Drug: Insulin glargine (Lantus) Strength: 100 units/mL Qty: 1 LOT: [id number] Exp.Date: 02/2018  The patient has been instructed regarding the correct time, dose, and frequency of taking this medication, including desired effects and most common side effects.   Kim,Jennifer J 10:19 AM 05/04/2016

## 2016-05-04 NOTE — Patient Instructions (Signed)
Please go to cardiology for a stress test and cardiac monitor.   Byetta has been increased to 10 mcg twice daily. Use as instructed.   Return for a follow-up visit in 1 month.

## 2016-05-05 NOTE — Progress Notes (Signed)
Physician-pharmacist co-visit Patient states she is doing better with medication management at home, needs refills on rosuvastatin and exenatide. Will collaborate with PCP in her care.

## 2016-05-06 NOTE — Assessment & Plan Note (Addendum)
Assessment: Patient states she has been sweating profusely for the past 5-6 months. States sweating episodes occur anytime of the day or at night, are abrupt in onset, and occur twice a day. Episodes last about 5 minutes. States she went through menopause 30 years ago and her current symptoms are similar to the hot flashes she experienced during menopause. Reports having a hysterectomy done many years ago and is not sure if it was complete or partial. She denies any symptoms of hyper or hypothyroidism including heat/cold intolerance, diarrhea/ constipation, skin or hair changes, or weight loss/gain. TSH was normal in 11/2015. Does report having palpitations both at rest and with activity. She is not sure when her palpitations started and whether they are associated with the episodes of sweating. Pheochromocytoma is less likely as her BP is normal and she is not having any GI symptoms such as diarrhea.  Patient also reports having chest pain which is substernal and feels "heavy." States her last episode of chest pain was 2 days ago. Reports becoming slightly short of breath when it occurs. States the chest pain is associated with activity and rest. She is not sure when it started or for how long the pain lasts. Not sure if the chest pain is associated with palpitations and/ or sweating. Denies having any associated dizziness or lightheadedness. Her current symptoms are concerning for angina as L heart cath from 09/2014 was intermediate risk (EF 38%).   Addendum: The patient did not have a heart cath in 2015. She had myocardial perfusion scanning which revealed a small-sized mild intensity fixed apical attenuation artifact, global hypokinesis with an EF of 38%, and no reversible ischemia.  Plan: -30 day cardiac event monitor to look for tachyarrhythmias  -Stress test has been ordered  -Advised patient to keep a log of her symptoms including sweating, palpitations, and chest pain. This would help [place]  determine whether there is any association between these symptoms.

## 2016-05-06 NOTE — Assessment & Plan Note (Signed)
Lab Results  Component Value Date   HGBA1C 13.4 05/04/2016   HGBA1C >14.0 09/29/2015   HGBA1C 9.8 06/04/2015     Assessment: Diabetes control:  poorly controlled Progress toward A1C goal:   poor (goal A1c less than 7) Comments: She is currently taking Byetta 5 mcg twice daily, Lantus 40 units at bedtime, Humalog 14 units once daily, and metformin 1000 mg twice daily. Blood glucose meter showing an average of 434, high 591, and low 278. Patient reports eating one proper meal in a day at 3 PM and usually likes to snack at night. Snack includes chips and fruit. Blood glucose meter and log is showing that she is checking her blood sugar only once a day. The values might not be accurate as patient reports checking her blood glucose after her snack at night . I explained to the patient that she should be checking her blood glucose at least 3 times a day before meals and not after.   Plan: Medications: Increase Byetta to 10 mcg twice daily. Continue all other medications listed above.  Home glucose monitoring: Frequency:  at least 3 times a day Timing:  before meals Instruction/counseling given: reminded to get eye exam, reminded to bring blood glucose meter & log to each visit, reminded to bring medications to each visit, discussed foot care, discussed the need for weight loss and discussed diet Other plans:  -Repeat A1c in 3 months -Patient has been advised to bring her meter and log with her to the next visit. If the morning blood glucose values are high, consider increasing the dose of her basal insulin.

## 2016-05-06 NOTE — Assessment & Plan Note (Signed)
Assessment: Reports having chest pain which is substernal and feels "heavy." States her last episode of chest pain was 2 days ago. Reports becoming slightly short of breath when it occurs. States the chest pain is associated with activity and rest. She is not sure when it started or for how long the pain lasts. Not sure if the chest pain is associated with palpitations and/ or sweating. Denies having any associated dizziness or lightheadedness. Her current symptoms are concerning for angina as L heart cath from 09/2014 was intermediate risk (EF 38%).   Plan: -Stress test has been ordered

## 2016-05-06 NOTE — Progress Notes (Signed)
Patient ID: Susan Holmes, female   DOB: 09-Nov-1956, 60 y.o.   MRN: [id number]   Subjective:   Patient ID: Susan Holmes female   DOB: 09-12-1956 60 y.o.   MRN: [id number]  HPI: [name] is a 60 y.o. female with a past medical history of conditions listed below presenting to the clinic to discuss her diabetes and hypertension. Please see assessment and plan for the status of the patient's chronic medical conditions.    Past Medical History  Diagnosis Date  . Hepatitis B infection pt unsure    resolved  . Diabetes mellitus, type II ([place])   . HTN (hypertension)   . Hyperlipidemia   . Congenital blindness     L eye  . Tobacco abuse     .5 PPD smoker  . Anxiety   . Depression   . Arthritis   . Abnormal stress test   . Glaucoma    Current Outpatient Prescriptions  Medication Sig Dispense Refill  . ACCU-CHEK FASTCLIX LANCETS MISC Check blood sugar 3x a day as instructed dx cod 250.00 insulin requiring 102 each 5  . Alcohol Swabs (ALCOHOL PREP) 70 % PADS     . amLODipine (NORVASC) 5 MG tablet TAKE 1 TABLET(5 MG) BY MOUTH DAILY 90 tablet 3  . aspirin 81 MG EC tablet TAKE 1 TABLET BY MOUTH EVERY DAY 30 tablet 0  . Blood Glucose Calibration (ACCU-CHEK SMARTVIEW CONTROL) LIQD     . Blood Glucose Monitoring Suppl (ACCU-CHEK AVIVA PLUS) W/DEVICE KIT Use to check blood sugar 3 to 4 times daily. diag code 250.62. Insulin dependent 1 kit 0  . citalopram (CELEXA) 40 MG tablet Take 40 mg by mouth daily.  2  . clonazePAM (KLONOPIN) 0.5 MG tablet Take 0.5 mg by mouth daily as needed for anxiety.     . diclofenac (VOLTAREN) 75 MG EC tablet Take 1 tablet (75 mg total) by mouth 2 (two) times daily as needed for moderate pain (with food). 180 tablet 0  . DULoxetine (CYMBALTA) 60 MG capsule Take 1 capsule (60 mg total) by mouth daily. 30 capsule 1  . exenatide (BYETTA 10 MCG PEN) 10 MCG/0.04ML SOPN injection Inject 0.04 mLs (10 mcg total) into the skin 2 (two) times daily with a meal. 2.4  mL 3  . glucose blood (ACCU-CHEK AVIVA PLUS) test strip Use to test blood sugar 3 times daily. diag code E11.41. Insulin dependent 100 each 11  . ibuprofen (ADVIL,MOTRIN) 600 MG tablet Take 1 tablet (600 mg total) by mouth every 6 (six) hours as needed. 30 tablet 0  . Insulin Glargine (LANTUS SOLOSTAR) 100 UNIT/ML Solostar Pen Inject 40 Units into the skin at bedtime. 15 mL 3  . insulin lispro (HUMALOG KWIKPEN) 100 UNIT/ML KiwkPen Inject 8 units with lunch and 12 units with dinner. Eat meals within 10 minutes of injection. (Patient taking differently: Inject 14 Units into the skin daily. ) 15 mL 3  . Insulin Pen Needle (VALUMARK PEN NEEDLES) 31G X 8 MM MISC 1 each by Does not apply route daily. 100 each 3  . Lancet Devices (ADJUSTABLE LANCING DEVICE) MISC     . loratadine (CLARITIN) 10 MG tablet Take 1 tablet (10 mg total) by mouth daily. 30 tablet 2  . losartan (COZAAR) 50 MG tablet TAKE 1 TABLET(50 MG) BY MOUTH TWICE DAILY 180 tablet 3  . magnesium citrate SOLN Take 296 mLs (1 Bottle total) by mouth once. 195 mL 0  . metFORMIN (GLUCOPHAGE) 1000 MG tablet  Take 1048m (1 tablets) 2 times a day. 60 tablet 3  . mirtazapine (REMERON) 30 MG tablet     . rosuvastatin (CRESTOR) 20 MG tablet Take 1 tablet (20 mg total) by mouth at bedtime. 30 tablet 11  . sodium chloride (OCEAN) 0.65 % SOLN nasal spray Place 2 sprays into both nostrils as needed for congestion. 60 mL 2  . tiZANidine (ZANAFLEX) 2 MG tablet Take 1 tablet (2 mg total) by mouth every 6 (six) hours as needed for muscle spasms. 10 tablet 0  . traMADol (ULTRAM) 50 MG tablet Take 1 tablet (50 mg total) by mouth every 12 (twelve) hours as needed. 20 tablet 0  . traZODone (DESYREL) 100 MG tablet Take 200 mg by mouth at bedtime.      No current facility-administered medications for this visit.   Family History  Problem Relation Age of Onset  . Heart disease Mother 650 . Hypertension Mother   . Heart disease Father 665 . Aneurysm Brother   .  Aneurysm Sister   . Colon cancer Neg Hx   . Esophageal cancer Neg Hx   . Stomach cancer Neg Hx    Social History   Social History  . Marital Status: Divorced    Spouse Name: N/A  . Number of Children: 1  . Years of Education: 12   Occupational History  .  Unemployed   Social History Main Topics  . Smoking status: Current Every Day Smoker -- 0.40 packs/day for 20 years    Types: Cigarettes  . Smokeless tobacco: Current User    Types: Chew     Comment: Nicorette gum./ LESS 1/2PPD  . Alcohol Use: No  . Drug Use: No  . Sexual Activity: Not Currently    Birth Control/ Protection: Surgical   Other Topics Concern  . None   Social History Narrative   Review of Systems: Review of Systems  Constitutional: Negative for fever, chills and malaise/fatigue.  Respiratory: Positive for shortness of breath. Negative for cough, sputum production and wheezing.   Cardiovascular: Positive for chest pain and palpitations. Negative for orthopnea and leg swelling.  Gastrointestinal: Negative for nausea, vomiting, abdominal pain and diarrhea.  Genitourinary: Negative for dysuria and flank pain.  Musculoskeletal: Negative for myalgias and back pain.  Skin:       Excessive sweating  Neurological: Negative for dizziness, sensory change and focal weakness.   Objective:  Physical Exam: Filed Vitals:   05/04/16 1015  BP: 124/78  Pulse: 94  Temp: 98.3 F (36.8 C)  TempSrc: Oral  Height: '5\' 7"'  (1.702 m)  Weight: 185 lb 1.6 oz (83.961 kg)  SpO2: 99%   Physical Exam  Constitutional: She is oriented to person, place, and time. She appears well-developed and well-nourished. No distress.  HENT:  Head: Normocephalic and atraumatic.  Mouth/Throat: Oropharynx is clear and moist.  Eyes: EOM are normal. Pupils are equal, round, and reactive to light.  Neck: Neck supple. No tracheal deviation present.  Cardiovascular: Normal rate, regular rhythm and intact distal pulses.  Exam reveals no gallop  and no friction rub.   Pulmonary/Chest: Effort normal and breath sounds normal. No respiratory distress. She has no wheezes. She has no rales.  Abdominal: Soft. Bowel sounds are normal. She exhibits no distension. There is no tenderness.  Musculoskeletal: She exhibits no edema or tenderness.  Neurological: She is alert and oriented to person, place, and time.  Skin: Skin is warm and dry. No rash noted. She is not diaphoretic.  No erythema.   Assessment & Plan:

## 2016-05-06 NOTE — Assessment & Plan Note (Signed)
BP Readings from Last 3 Encounters:  05/04/16 124/78  02/12/16 149/96  01/13/16 124/80    Lab Results  Component Value Date   NA 133* 09/29/2015   K 4.2 09/29/2015   CREATININE 0.92 09/29/2015    Assessment: Blood pressure control:  well-controlled Progress toward BP goal:   at goal (below 140/90) Comments: Patient is currently taking amlodipine 5 mg daily and losartan 50 mg twice daily.   Plan: Medications:  continue current medications Other plans:  -Recheck blood pressure next visit

## 2016-05-08 NOTE — Addendum Note (Signed)
Addended by: Oval Linsey D on: 05/08/2016 07:07 AM   Modules accepted: Level of Service

## 2016-05-08 NOTE — Progress Notes (Signed)
Case discussed with Dr. Marlowe Sax at the time of the visit.  We reviewed the resident's history and exam and pertinent patient test results.  I agree with the assessment, diagnosis, and plan of care documented in the resident's note with the following exception:  The patient never had a heart cath in 2015.  She had myocardial perfusion scanning which revealed a small defect and an EF of 38% but no reversible ischemia.  Given her multiple risk factors and current chest pain it was decided to reassess with a nuclear stress test looking for evidence of reversible ischemia.

## 2016-06-23 ENCOUNTER — Telehealth: Payer: Self-pay | Admitting: Internal Medicine

## 2016-06-23 NOTE — Telephone Encounter (Signed)
Pt states she cannot sleep at all, taking all her meds as prescribed but unable to sleep, offered an appt, refused, wants something done today, she would like something called in

## 2016-06-23 NOTE — Telephone Encounter (Signed)
Wants to speak to nurse °

## 2016-06-27 ENCOUNTER — Encounter (HOSPITAL_COMMUNITY): Payer: Self-pay | Admitting: *Deleted

## 2016-06-27 ENCOUNTER — Emergency Department (HOSPITAL_COMMUNITY): Payer: Medicare Other

## 2016-06-27 ENCOUNTER — Emergency Department (HOSPITAL_COMMUNITY)
Admission: EM | Admit: 2016-06-27 | Discharge: 2016-06-27 | Disposition: A | Discharge status: Home / Self Care | Payer: Medicare Other | Attending: Emergency Medicine | Admitting: Emergency Medicine

## 2016-06-27 DIAGNOSIS — F17210 Nicotine dependence, cigarettes, uncomplicated: Secondary | ICD-10-CM | POA: Diagnosis not present

## 2016-06-27 DIAGNOSIS — M1990 Unspecified osteoarthritis, unspecified site: Secondary | ICD-10-CM | POA: Insufficient documentation

## 2016-06-27 DIAGNOSIS — F329 Major depressive disorder, single episode, unspecified: Secondary | ICD-10-CM | POA: Insufficient documentation

## 2016-06-27 DIAGNOSIS — F32A Depression, unspecified: Secondary | ICD-10-CM

## 2016-06-27 DIAGNOSIS — F17220 Nicotine dependence, chewing tobacco, uncomplicated: Secondary | ICD-10-CM | POA: Diagnosis not present

## 2016-06-27 DIAGNOSIS — Z794 Long term (current) use of insulin: Secondary | ICD-10-CM | POA: Insufficient documentation

## 2016-06-27 DIAGNOSIS — E119 Type 2 diabetes mellitus without complications: Secondary | ICD-10-CM | POA: Insufficient documentation

## 2016-06-27 DIAGNOSIS — Z7984 Long term (current) use of oral hypoglycemic drugs: Secondary | ICD-10-CM | POA: Diagnosis not present

## 2016-06-27 DIAGNOSIS — E785 Hyperlipidemia, unspecified: Secondary | ICD-10-CM | POA: Diagnosis not present

## 2016-06-27 DIAGNOSIS — Z7982 Long term (current) use of aspirin: Secondary | ICD-10-CM | POA: Insufficient documentation

## 2016-06-27 DIAGNOSIS — I10 Essential (primary) hypertension: Secondary | ICD-10-CM | POA: Insufficient documentation

## 2016-06-27 DIAGNOSIS — R079 Chest pain, unspecified: Secondary | ICD-10-CM | POA: Diagnosis not present

## 2016-06-27 DIAGNOSIS — R0789 Other chest pain: Secondary | ICD-10-CM | POA: Diagnosis not present

## 2016-06-27 DIAGNOSIS — G4700 Insomnia, unspecified: Secondary | ICD-10-CM | POA: Insufficient documentation

## 2016-06-27 DIAGNOSIS — R05 Cough: Secondary | ICD-10-CM | POA: Diagnosis not present

## 2016-06-27 LAB — I-STAT TROPONIN, ED: Troponin i, poc: 0 ng/mL (ref 0.00–0.08)

## 2016-06-27 LAB — CBC
HCT: 36.1 % (ref 36.0–46.0)
Hemoglobin: 11.4 g/dL — ABNORMAL LOW (ref 12.0–15.0)
MCH: 21.6 pg — ABNORMAL LOW (ref 26.0–34.0)
MCHC: 31.6 g/dL (ref 30.0–36.0)
MCV: 68.5 fL — ABNORMAL LOW (ref 78.0–100.0)
Platelets: 244 10*3/uL (ref 150–400)
RBC: 5.27 MIL/uL — ABNORMAL HIGH (ref 3.87–5.11)
RDW: 14.8 % (ref 11.5–15.5)
WBC: 7.8 10*3/uL (ref 4.0–10.5)

## 2016-06-27 LAB — BASIC METABOLIC PANEL
Anion gap: 8 (ref 5–15)
BUN: <5 mg/dL — ABNORMAL LOW (ref 6–20)
CO2: 24 mmol/L (ref 22–32)
Calcium: 9.2 mg/dL (ref 8.9–10.3)
Chloride: 103 mmol/L (ref 101–111)
Creatinine, Ser: 0.75 mg/dL (ref 0.44–1.00)
GFR calc Af Amer: >60 mL/min (ref >60)
GFR calc non Af Amer: >60 mL/min (ref >60)
Glucose, Bld: 320 mg/dL — ABNORMAL HIGH (ref 65–99)
Potassium: 3.9 mmol/L (ref 3.5–5.1)
Sodium: 135 mmol/L (ref 135–145)

## 2016-06-27 MED ORDER — BENZONATATE 100 MG PO CAPS: 100.0000 mg | ORAL_CAPSULE | Freq: Three times a day (TID) | ORAL | Status: DC

## 2016-06-27 MED ORDER — SODIUM CHLORIDE 0.9 % IV BOLUS (SEPSIS)
1000.0000 mL | Freq: Once | INTRAVENOUS | Status: AC
  Administered 2016-06-27: 1000 mL via INTRAVENOUS

## 2016-06-27 MED ORDER — IPRATROPIUM-ALBUTEROL 0.5-2.5 (3) MG/3ML IN SOLN
3.0000 mL | Freq: Once | RESPIRATORY_TRACT | Status: AC
Start: 2016-06-27 — End: 2016-06-27
  Administered 2016-06-27: 3 mL via RESPIRATORY_TRACT
  Filled 2016-06-27: qty 3

## 2016-06-27 NOTE — ED Notes (Signed)
RN to draw labs with start of IV

## 2016-06-27 NOTE — ED Notes (Signed)
Pt presents to ED with multiple symptoms; she c/o chest pain that started 3 weeks ag, along with congestion, cough, and dizziness. She states she hasn't been able to sleep 3 weeks due to cough and "thoughts". She reports feeling very depressed, more than usual. She is hypertensive, however she said she hasn't taken her medications, because "I'm giving up, I'm so tired from not sleeping". She reports hx of anxiety and depression for which she goes to [facility]. Pt has flat affect, answers most questions with "yes" or "no", avoids eye contact.

## 2016-06-27 NOTE — ED Notes (Signed)
Patient transported to X-ray 

## 2016-06-27 NOTE — Discharge Instructions (Signed)

## 2016-06-27 NOTE — ED Provider Notes (Signed)
CSN: [id number]     Arrival date & time 06/27/16  1406 History   First MD Initiated Contact with Patient 06/27/16 1419     Chief Complaint  Patient presents with  . Chest Pain  . Insomnia  . Cough  . Depression   HPI Comments: 60 year old female who presents with multiple medical complaints. PMH significant for DM, HTN, Hyperlipidemia, tobacco abuse, insomnia, depression, and anxiety. She states that about three weeks ago she started having a productive cough. She began to have chest tightness across her chest which is constant, feels like a pressure, and does not radiate. She reports associated lightheadedness and congestion with a headache. Denies fever, chills, palpitations, leg swelling, wheezing, abdominal pain, N/V/D. Last echo was 2015 which showed EF 45-50% with mild MR. She had myocardial perfusion scan in 2015 which revealed a small-sized mild intensity fixed apical attenuation artifact, global hypokinesis with an EF of 38%, and no reversible ischemia.She went to her PCP's office who ordered another ST however this has not been done yet. She is reporting worsening insomnia. She states she coughs through the night. She also states she has worsening anxiety and endorses racing thoughts. Reports difficulty going to sleep and staying asleep. She is on 2 different medications for sleep currently with no relief (Trazodone and Remeron).  Patient is a 60 y.o. female presenting with chest pain, insomnia, cough, and depression.  Chest Pain Associated symptoms: cough, headache and shortness of breath   Associated symptoms: no abdominal pain, no fever, no nausea, no palpitations and not vomiting   Insomnia Associated symptoms include chest pain, congestion, coughing and headaches. Pertinent negatives include no abdominal pain, chills, fever, nausea, sore throat or vomiting.  Cough Associated symptoms: chest pain, headaches and shortness of breath   Associated symptoms: no chills, no fever, no  rhinorrhea, no sore throat and no wheezing   Depression Associated symptoms include chest pain, congestion, coughing and headaches. Pertinent negatives include no abdominal pain, chills, fever, nausea, sore throat or vomiting.    Past Medical History  Diagnosis Date  . Hepatitis B infection pt unsure    resolved  . Diabetes mellitus, type II ([place])   . HTN (hypertension)   . Hyperlipidemia   . Congenital blindness     L eye  . Tobacco abuse     .5 PPD smoker  . Anxiety   . Depression   . Arthritis   . Abnormal stress test   . Glaucoma    Past Surgical History  Procedure Laterality Date  . Abdominal hysterectomy    . Left heart catheterization with coronary angiogram N/A 10/09/2014    Procedure: LEFT HEART CATHETERIZATION WITH CORONARY ANGIOGRAM;  Surgeon: Troy Sine, MD;  Location: [facility] CATH LAB;  Service: Cardiovascular;  Laterality: N/A;   Family History  Problem Relation Age of Onset  . Heart disease Mother 64  . Hypertension Mother   . Heart disease Father 47  . Aneurysm Brother   . Aneurysm Sister   . Colon cancer Neg Hx   . Esophageal cancer Neg Hx   . Stomach cancer Neg Hx    Social History  Substance Use Topics  . Smoking status: Current Every Day Smoker -- 0.40 packs/day for 20 years    Types: Cigarettes  . Smokeless tobacco: Current User    Types: Chew     Comment: Nicorette gum./ LESS 1/2PPD  . Alcohol Use: No   OB History    No data available  Review of Systems  Constitutional: Negative for fever and chills.  HENT: Positive for congestion, postnasal drip and sinus pressure. Negative for rhinorrhea and sore throat.   Respiratory: Positive for cough and shortness of breath. Negative for wheezing.   Cardiovascular: Positive for chest pain. Negative for palpitations and leg swelling.  Gastrointestinal: Negative for nausea, vomiting, abdominal pain and diarrhea.  Neurological: Positive for light-headedness and headaches.  Psychiatric/Behavioral:  Positive for depression, sleep disturbance and dysphoric mood. Negative for suicidal ideas. The patient is nervous/anxious and has insomnia.   All other systems reviewed and are negative.     Allergies  Ace inhibitors  Home Medications   Prior to Admission medications   Medication Sig Start Date End Date Taking? Authorizing Provider  ACCU-CHEK FASTCLIX LANCETS MISC Check blood sugar 3x a day as instructed dx cod 250.00 insulin requiring 12/04/14   Otho Bellows, MD  Alcohol Swabs (ALCOHOL PREP) 70 % PADS  05/30/15   Historical Provider, MD  amLODipine (NORVASC) 5 MG tablet TAKE 1 TABLET(5 MG) BY MOUTH DAILY 10/10/15   Shela Leff, MD  aspirin 81 MG EC tablet TAKE 1 TABLET BY MOUTH EVERY DAY 03/10/15   Hoyt Koch, MD  Blood Glucose Calibration (ACCU-CHEK SMARTVIEW CONTROL) LIQD  05/30/15   Historical Provider, MD  Blood Glucose Monitoring Suppl (ACCU-CHEK AVIVA PLUS) W/DEVICE KIT Use to check blood sugar 3 to 4 times daily. diag code 250.62. Insulin dependent 09/11/14   Aldine Contes, MD  citalopram (CELEXA) 40 MG tablet Take 40 mg by mouth daily. 09/09/15   Historical Provider, MD  clonazePAM (KLONOPIN) 0.5 MG tablet Take 0.5 mg by mouth daily as needed for anxiety.  05/31/15   Historical Provider, MD  diclofenac (VOLTAREN) 75 MG EC tablet Take 1 tablet (75 mg total) by mouth 2 (two) times daily as needed for moderate pain (with food). 09/29/15   Shela Leff, MD  DULoxetine (CYMBALTA) 60 MG capsule Take 1 capsule (60 mg total) by mouth daily. 11/18/15 11/17/16  Shela Leff, MD  exenatide (BYETTA 10 MCG PEN) 10 MCG/0.04ML SOPN injection Inject 0.04 mLs (10 mcg total) into the skin 2 (two) times daily with a meal. 05/04/16   Shela Leff, MD  glucose blood (ACCU-CHEK AVIVA PLUS) test strip Use to test blood sugar 3 times daily. diag code E11.41. Insulin dependent 03/05/16   Axel Filler, MD  ibuprofen (ADVIL,MOTRIN) 600 MG tablet Take 1 tablet (600 mg total) by  mouth every 6 (six) hours as needed. 02/12/16   Shawn C Joy, PA-C  Insulin Glargine (LANTUS SOLOSTAR) 100 UNIT/ML Solostar Pen Inject 40 Units into the skin at bedtime. 09/29/15   Shela Leff, MD  insulin lispro (HUMALOG KWIKPEN) 100 UNIT/ML KiwkPen Inject 8 units with lunch and 12 units with dinner. Eat meals within 10 minutes of injection. Patient taking differently: Inject 14 Units into the skin daily.  10/06/15   Shela Leff, MD  Insulin Pen Needle (VALUMARK PEN NEEDLES) 31G X 8 MM MISC 1 each by Does not apply route daily. 11/20/14   Francesca Oman, DO  Lancet Devices (ADJUSTABLE LANCING DEVICE) [place]  03/17/15   Historical Provider, MD  loratadine (CLARITIN) 10 MG tablet Take 1 tablet (10 mg total) by mouth daily. 04/21/15   Nino Glow McLean-Scocozza, MD  losartan (COZAAR) 50 MG tablet TAKE 1 TABLET(50 MG) BY MOUTH TWICE DAILY 03/29/16   Shela Leff, MD  magnesium citrate SOLN Take 296 mLs (1 Bottle total) by mouth once. 06/04/15   Gregary Signs  E Mallory, MD  metFORMIN (GLUCOPHAGE) 1000 MG tablet Take 108m (1 tablets) 2 times a day. 03/26/16   VShela Leff MD  mirtazapine (REMERON) 30 MG tablet  04/02/15   Historical Provider, MD  rosuvastatin (CRESTOR) 20 MG tablet Take 1 tablet (20 mg total) by mouth at bedtime. 03/20/15 03/19/16  JKarlene Einstein MD  sodium chloride (OCEAN) 0.65 % SOLN nasal spray Place 2 sprays into both nostrils as needed for congestion. 04/21/15   TNino GlowMcLean-Scocozza, MD  tiZANidine (ZANAFLEX) 2 MG tablet Take 1 tablet (2 mg total) by mouth every 6 (six) hours as needed for muscle spasms. 11/02/15   BComer Locket PA-C  traMADol (ULTRAM) 50 MG tablet Take 1 tablet (50 mg total) by mouth every 12 (twelve) hours as needed. 12/16/15 12/15/16  AJule Ser DO  traZODone (DESYREL) 100 MG tablet Take 200 mg by mouth at bedtime.     Historical Provider, MD   BP 189/110 mmHg  Pulse 78  Temp(Src) 98.4 F (36.9 C) (Oral)  Resp 18  SpO2 99%   Physical Exam   Constitutional: She is oriented to person, place, and time. She appears well-developed and well-nourished. No distress.  HENT:  Head: Normocephalic and atraumatic.  Right Ear: Hearing, tympanic membrane, external ear and ear canal normal.  Left Ear: Hearing, tympanic membrane, external ear and ear canal normal.  Nose: No mucosal edema or rhinorrhea. Right sinus exhibits maxillary sinus tenderness. Right sinus exhibits no frontal sinus tenderness. Left sinus exhibits maxillary sinus tenderness. Left sinus exhibits no frontal sinus tenderness.  Mouth/Throat: Uvula is midline, oropharynx is clear and moist and mucous membranes are normal.  Eyes: Conjunctivae are normal. Pupils are equal, round, and reactive to light. Right eye exhibits no discharge. Left eye exhibits no discharge. No scleral icterus.  Neck: Normal range of motion.  Cardiovascular: Normal rate and regular rhythm.  Exam reveals no gallop and no friction rub.   No murmur heard. Pulmonary/Chest: Effort normal. No respiratory distress. She has no decreased breath sounds. She has no wheezes. She has rhonchi. She has no rales. She exhibits no tenderness.  Coarse breath sounds heard best in bases  Abdominal: Soft. Bowel sounds are normal. She exhibits no distension and no mass. There is no tenderness. There is no rebound and no guarding.  Neurological: She is alert and oriented to person, place, and time.  Skin: Skin is warm and dry.  Psychiatric: Her behavior is normal. Her affect is blunt. She exhibits a depressed mood.    ED Course  Procedures (including critical care time) Labs Review Labs Reviewed  BASIC METABOLIC PANEL - Abnormal; Notable for the following:    Glucose, Bld 320 (*)    BUN <5 (*)    All other components within normal limits  CBC - Abnormal; Notable for the following:    RBC 5.27 (*)    Hemoglobin 11.4 (*)    MCV 68.5 (*)    MCH 21.6 (*)    All other components within normal limits  I-STAT TROPOININ, ED   [facility]    Imaging Review Dg Chest 2 View  06/27/2016  CLINICAL DATA:  Coughing congestion. Central chest pain for 3 weeks. EXAM: CHEST  2 VIEW COMPARISON:  02/02/2016. FINDINGS: The lungs are clear wiithout focal pneumonia, edema, pneumothorax or pleural effusion. Cardiopericardial silhouette is at upper limits of normal for size. The visualized bony structures of the thorax are intact. IMPRESSION: Stable.  No acute cardiopulmonary findings. Electronically Signed  By: Misty Stanley M.D.   On: 06/27/2016 15:35   I have personally reviewed and evaluated these images and lab results as part of my medical decision-making.   EKG Interpretation   Date/Time:  Sunday June 27 2016 14:26:15 EDT Ventricular Rate:  75 PR Interval:    QRS Duration: 119 QT Interval:  423 QTC Calculation: 473 R Axis:   31 Text Interpretation:  Sinus rhythm Nonspecific intraventricular conduction  delay Borderline repolarization abnormality Nonspecific T wave abnormality  Otherwise no significant change Confirmed by FLOYD MD, Quillian Quince ([id number]) on  06/27/2016 3:11:28 PM      MDM   Final diagnoses:  Atypical chest pain  Insomnia  Depression   60 year old female who presents with chest pain, cough, depression/anxiety, and worsening insomnia. Her symptoms are most likely due to cough which is causing MSK chest pain and worsening her insomnia due to her anxiety and depression. PE reveals rhonchi most likely due to chronic tobacco use. Albuterol tx given and on repeat lung exam lungs are CTA. Chest pain work up is unremarkable. EKG is SR with nonspecific T wave abnormality. CXR is negative. Troponin is 0. CBC remarkable for mild anemia. BMP remarkable for glucose of 320 - counseled patient on management of BG. Patient is a smoker. HEART score is 4.   Patient is mostly concerned with lack of sleep. She is frustrated and discussed she is already on medicine for sleep and should follow up with PCP and Beverly Sessions  who she sees for her depression/anxiety for possible counseling or therapy. Discussed that delta troponin - pt refused. Will give Tessalon for cough. Don't feel that patient has to sign out AMA, she has been having symptoms for 3 weeks and is most likely due to cough. Patient is NAD, non-toxic, with stable VS. Patient is informed of clinical course, understands medical decision making process, and agrees with plan. Opportunity for questions provided and all questions answered. Return precautions given.       Recardo Evangelist, PA-C 06/28/16 [place], DO 06/29/16 [phone]

## 2016-06-30 NOTE — Telephone Encounter (Signed)
appt 7/11

## 2016-06-30 NOTE — Telephone Encounter (Signed)
Patient needs to follow-up at the clinic to be assessed by a physician. No medications will be prescribed over the phone. Thanks.

## 2016-07-06 ENCOUNTER — Encounter: Payer: Self-pay | Admitting: Internal Medicine

## 2016-07-06 ENCOUNTER — Ambulatory Visit (INDEPENDENT_AMBULATORY_CARE_PROVIDER_SITE_OTHER): Payer: Medicare Other | Admitting: Internal Medicine

## 2016-07-06 VITALS — BP 148/99 | HR 94 | Temp 98.3°F | Ht 67.0 in | Wt 181.0 lb

## 2016-07-06 DIAGNOSIS — R059 Cough, unspecified: Secondary | ICD-10-CM

## 2016-07-06 DIAGNOSIS — I10 Essential (primary) hypertension: Secondary | ICD-10-CM | POA: Diagnosis not present

## 2016-07-06 DIAGNOSIS — F17210 Nicotine dependence, cigarettes, uncomplicated: Secondary | ICD-10-CM | POA: Diagnosis not present

## 2016-07-06 DIAGNOSIS — L749 Eccrine sweat disorder, unspecified: Secondary | ICD-10-CM

## 2016-07-06 DIAGNOSIS — R61 Generalized hyperhidrosis: Secondary | ICD-10-CM

## 2016-07-06 DIAGNOSIS — R05 Cough: Secondary | ICD-10-CM

## 2016-07-06 MED ORDER — AMOXICILLIN-POT CLAVULANATE 875-125 MG PO TABS: 1.0000 | ORAL_TABLET | Freq: Two times a day (BID) | ORAL | Status: DC

## 2016-07-06 MED ORDER — PREDNISONE 20 MG PO TABS: 40.0000 mg | ORAL_TABLET | Freq: Every day | ORAL | Status: DC

## 2016-07-06 NOTE — Patient Instructions (Signed)
Take Prednisone and Augmentin as prescribed.   Return for a follow-up visit in 1 month. If your symptoms do not get better, please return for a follow-up visit or seek medical attention sooner.

## 2016-07-08 ENCOUNTER — Telehealth: Payer: Self-pay | Admitting: Pharmacist

## 2016-07-08 DIAGNOSIS — R059 Cough, unspecified: Secondary | ICD-10-CM | POA: Insufficient documentation

## 2016-07-08 DIAGNOSIS — R05 Cough: Secondary | ICD-10-CM | POA: Insufficient documentation

## 2016-07-08 HISTORY — DX: Cough, unspecified: R05.9

## 2016-07-08 NOTE — Progress Notes (Signed)
Patient ID: Susan Holmes, female   DOB: [birth year], 60 y.o.   MRN: [id number]   CC: Cough  HPI:  [name] is a 60 y.o. female with a past medical history of conditions listed below presenting to the clinic complaining of a one-month history of cough, congestion, and frontal headaches. States her cough is productive of clear sputum and occurs mostly at night. Also reports having shortness of breath both at rest and with exertion. Reports having chest pain (chronic). Denies having any fevers, chills, sore throat, rhinorrhea, sneezing, or sinus pain/pressure. Denies having any GERD symptoms. Patient visited the ED on 06/27/16 and was prescribed Tessalon Per[place] but states this medication is not helping her. In addition, she has been taking Mucinex at home and it is not helping. She is a current smoker (5-6 cigarettes per day) and states she is not interested in quitting.  Please see assessment and plan for the status of the patient's chronic medical conditions.  Past Medical History  Diagnosis Date  . Hepatitis B infection pt unsure    resolved  . Diabetes mellitus, type II ([place])   . HTN (hypertension)   . Hyperlipidemia   . Congenital blindness     L eye  . Tobacco abuse     .5 PPD smoker  . Anxiety   . Depression   . Arthritis   . Abnormal stress test   . Glaucoma     Review of Systems:  A complete review of systems was negative except as per history of present illness.  Physical Exam:  Filed Vitals:   07/06/16 1612 07/06/16 1613  BP:  148/99  Pulse:  94  Temp: 98.3 F (36.8 C)   TempSrc: Oral   Height: 5\' 7"  (1.702 m)   Weight: 181 lb (82.101 kg)   SpO2:  99%   Physical Exam  Constitutional: She is oriented to person, place, and time. She appears well-developed and well-nourished. No distress.  HENT:  Head: Normocephalic and atraumatic.  Mouth/Throat: Oropharynx is clear and moist. No oropharyngeal exudate.  No tenderness on palpation of frontal, ethmoidal,  and maxillary sinuses.  Eyes: EOM are normal. Pupils are equal, round, and reactive to light.  Neck: Neck supple. No tracheal deviation present.  Cardiovascular: Normal rate, regular rhythm and intact distal pulses.  Exam reveals no gallop and no friction rub.   No murmur heard. Pulmonary/Chest: Effort normal and breath sounds normal. No respiratory distress. She has no wheezes. She has no rales.  Abdominal: Soft. Bowel sounds are normal. She exhibits no distension. There is no tenderness. There is no guarding.  Musculoskeletal: She exhibits no edema.  Neurological: She is alert and oriented to person, place, and time.  Strength and sensation grossly intact in bilateral upper and lower extremities.  Skin: Skin is warm and dry.    Assessment & Plan:   See encounters tab for problem based medical decision making.   Patient discussed with Dr. Evette Doffing

## 2016-07-08 NOTE — Telephone Encounter (Signed)
Steroid-Induced Hyperglycemia Prevention and Management Susan Holmes is a 60 y.o. female who meets criteria for [facility] quality improvement program (diabetes patient prescribed short course of steroids).  A/P Current Regimen  Patient prescribed prednisone 40 mg daily x 5 days, currently on day 2 of therapy. Patient taking prednisone in the AM  Prednisone indication COPD exacerbation  Current DM regimen: patient states she is taking exenatide 5 mcg BID, insulin lispro 14 units nightly, and insulin glargine 40 units nightly  Home BG Monitoring  Patient does have a meter at home and does check BG at home.  CBGs at home 300s, patient does not have meter with her to provide details  A1C prior to steroid course 13.4  S/Sx of hyper- or hypoglycemia: none, none  Medication Management  Physician preference level per protocol: 1  Increase exenatide to 10 mcg BID as prescribed, increase insulin lispro to 16 units nightly  Patient Education  Advised patient to monitor BG while on steroid therapy (at least twice daily prior to first 2 meals of the day).  Patient educated about signs/symptoms and advised to contact clinic if hyper- or hypoglycemic or any other issues of concern.  Patient did  verbalize understanding of information and regimen by repeating back topics discussed.  Follow-up daily  08/17/16 Dr. Geanie Kenning J 6:15 PM 07/08/2016

## 2016-07-08 NOTE — Assessment & Plan Note (Signed)
A Patient states she continues to sweat and continues to have chest pain on a daily basis. She describes the chest pain as "sharp." States the chest pain occurs both with activity and rest. Also reports having SOB. She is not sure how long each episode of chest pain last and is not sure if the chest pain is associated with SOB, sweating, and/or palpitations. Denies having any associated dizziness or lightheadedness. Her symptoms were concerning for angina and she was given a referral for a 30 day cardiac event monitor and stress test during her previous visit in May 2017. Patient states she has not heard from cardiology. During her previous visit, patient was also advised to keep a log of her symptoms including sweating, palpitations, and chest pain to help determine if there is any association between her symptoms. States she has not kept a log.  P -Our office staff will contact cardiology again for the 30 day cardiac event monitor and stress test

## 2016-07-08 NOTE — Assessment & Plan Note (Addendum)
A Patient is presenting with a one-month history of cough productive of clear sputum, shortness of breath, congestion, and frontal headaches. No fevers, chills, sore throat, rhinorrhea, sneezing, or sinus pain/pressure. Cough suppressant and expectorant have not helped her symptoms. Lungs are clear on exam. Chest x-ray done in the ED on 06/27/16 did not show any acute abnormality. Will treat her for presumed COPD exacerbation she has been smoking cigarettes for several years.   P -Amoxicillin-clavulanate 875/125 mg twice daily for 5 days -Prednisone 40 mg daily for 5 days -Reassess at next visit

## 2016-07-08 NOTE — Progress Notes (Signed)
Internal Medicine Clinic Attending  Case discussed with Dr. Rathoreat the time of the visit. We reviewed the resident's history and exam and pertinent patient test results. I agree with the assessment, diagnosis, and plan of care documented in the resident's note.  

## 2016-07-08 NOTE — Assessment & Plan Note (Addendum)
BP Readings from Last 3 Encounters:  07/06/16 148/99  06/27/16 189/110  05/04/16 124/78    Lab Results  Component Value Date   NA 135 06/27/2016   K 3.9 06/27/2016   CREATININE 0.75 06/27/2016    Assessment: Blood pressure control:  above goal (less than 140/90) Progress toward BP goal:   deteriorated Comments: Patient reports being compliant with amlodipine 5 mg daily and losartan 50 mg twice daily. Blood pressure during prior clinic visit on 05/04/2016 was 124/78.  Plan: Medications:  continue current medications Educational resources provided:  educated patient about healthy eating and exercise. Other plans:  -Consider increasing dose of Amlodipine if blood pressure continues to be elevated at next visit -Return to clinic in 1 month

## 2016-07-09 ENCOUNTER — Telehealth: Payer: Self-pay | Admitting: Dietician

## 2016-07-09 NOTE — Telephone Encounter (Signed)
Called patient to follow up on blood sugars today:  Her blood sugar was 340 first thing this morning, 440 now. Did not take prednisone today because it caused her to not be able to  sleep Took yesterday 40 lantus, 16 units humalog  At night and 10 mcg [place] twice daily( has been on this for a while)   I spoke with Dr. Daryll Drown who okayed for Grave to begin inject 5 units Humalog with breakfast daily. Grave verbalized understanding using teachback. She was educated about how to contact the after hours/weekend doctor on call.

## 2016-07-09 NOTE — Telephone Encounter (Signed)
Opened in error

## 2016-07-12 ENCOUNTER — Other Ambulatory Visit: Payer: Self-pay | Admitting: Internal Medicine

## 2016-07-12 DIAGNOSIS — L749 Eccrine sweat disorder, unspecified: Secondary | ICD-10-CM

## 2016-07-12 NOTE — Telephone Encounter (Signed)
Spoke w/ pt, she will bring meter with her

## 2016-07-12 NOTE — Telephone Encounter (Signed)
Discussed with Debera Lat.

## 2016-07-12 NOTE — Telephone Encounter (Signed)
Please advise patient to stop taking Prednisone. She has already been started on Humalog 5 units in the morning. Since she is not eating and has an appointment tomorrow morning, I will not increase the dose of her insulin at this time. It is very important for the patient to come to her appointment tomorrow and bring her glucose meter with her. Thanks.

## 2016-07-12 NOTE — Telephone Encounter (Signed)
Given an appt for tues 7/18 at 0945 [facility]

## 2016-07-12 NOTE — Telephone Encounter (Signed)
Called to follow up on Steroid Induced Hyperglycemia Prevention and Management  [name] says she took the 5 units humalog of the past 4 days including at 10:30 AM this morning. She has not taken the steroids since last Thursday. Her blood sugar is  311 now at 1:29 PM, says she has not eaten, has only had 1 cup of coffee with cream and sugar. .  She says despite not taking the steroids she still cannot sleep, has some dizziness in the morning that goes away in a few minutes, her chest is heavy, congested, and she is tired. Urine and bowel movements regular, apetite so- so. She does not feel she has a fever.    Blood sugars over the weekends:365/399/402- different times of day.   I advised [name] to continue the same and I would route this note to Dr. Marlowe Sax for her recommendations since patient is no longer on steroids, (confimed she stopped the Augmentin on Thursday as well because she thought it was causing her to not sleep) blood sugars high and symptoms have not resolved.

## 2016-07-13 ENCOUNTER — Encounter: Payer: Self-pay | Admitting: Internal Medicine

## 2016-07-13 ENCOUNTER — Ambulatory Visit (INDEPENDENT_AMBULATORY_CARE_PROVIDER_SITE_OTHER): Payer: Medicare Other | Admitting: Internal Medicine

## 2016-07-13 VITALS — BP 151/85 | HR 93 | Temp 98.4°F | Wt 182.0 lb

## 2016-07-13 DIAGNOSIS — E1140 Type 2 diabetes mellitus with diabetic neuropathy, unspecified: Secondary | ICD-10-CM

## 2016-07-13 DIAGNOSIS — Z794 Long term (current) use of insulin: Secondary | ICD-10-CM | POA: Diagnosis not present

## 2016-07-13 DIAGNOSIS — R05 Cough: Secondary | ICD-10-CM | POA: Diagnosis not present

## 2016-07-13 DIAGNOSIS — E1165 Type 2 diabetes mellitus with hyperglycemia: Secondary | ICD-10-CM | POA: Diagnosis not present

## 2016-07-13 DIAGNOSIS — IMO0002 Reserved for concepts with insufficient information to code with codable children: Secondary | ICD-10-CM

## 2016-07-13 DIAGNOSIS — R059 Cough, unspecified: Secondary | ICD-10-CM

## 2016-07-13 MED ORDER — FAMOTIDINE 20 MG PO TABS: 20.0000 mg | ORAL_TABLET | Freq: Two times a day (BID) | ORAL | Status: DC

## 2016-07-13 NOTE — Progress Notes (Signed)
Internal Medicine Clinic Attending  Case discussed with Dr. Patel,Rushil at the time of the visit.  We reviewed the resident's history and exam and pertinent patient test results.  I agree with the assessment, diagnosis, and plan of care documented in the resident's note.  

## 2016-07-13 NOTE — Patient Instructions (Addendum)
For your cough... -Try using the GoodRx card to purchase famotidine  For your diabetes... -STOP taking Humalog in the morning since you are not taking prednisone.  -Take Humalog 16 units before you eat -Try to cut back on your soda. -Check your sugar when you wake up in the morning and after you eat.  Please come back next week to see how you are doing so we can adjust your medications.  Thank you.

## 2016-07-13 NOTE — Assessment & Plan Note (Signed)
Assessment For the last 2 months, she notes a constant cough productive of mucus which is particularly worse at night and increased effort when it comes to breathing. She presented to her PCP on 7/11 and was prescribed Augmentin and prednisone 40 mg of which she took for 4 days but stopped after she felt her symptoms were not improving. She does feel that her sleep has been particularly worse during this time her other symptoms have been otherwise stable.  She doesn't knowledge smoking cigarettes or which one pack usually last her 2-2-1/2 days and denies wheezing, heartburn, difficulty swallowing, fevers, chills, nasal itching. She is frustrated with her lack of sleep and would like an answer for what is going on.  Review of her PFTs is notable for FEV1 60% and FEV1/FVC 80% back in October 2015. Given that FEV1/FVC is greater than 70%, this is not consistent with the diagnosis of COPD. Her smoking and nighttime symptoms to make silent GERD a possibility though seasonal allergies has been noted in the past of which may be contributory though she denies histamine mediated symptoms.  Plan -Prescribed famotidine 20 mg to be taken twice daily in the interest of cost as the patient is without money -Provided her with a good Rx discount card and offered her one-week follow-up to assess response to therapy

## 2016-07-13 NOTE — Progress Notes (Signed)
   Subjective:    Patient ID: Susan Holmes, female    DOB: 1956/03/11, 60 y.o.   MRN: [id number]  HPI Susan Holmes is a 60 year old female who presents today for cough. Please see assessment & plan for status of chronic medical problems.    Review of Systems     Objective:   Physical Exam  Constitutional: She is oriented to person, place, and time. She appears well-developed and well-nourished.  HENT:  Head: Normocephalic and atraumatic.  Posterior oropharynx notable for streaking.  Eyes: Conjunctivae are normal. No scleral icterus.  Pulmonary/Chest: Effort normal. No respiratory distress. She has no wheezes.  Neurological: She is alert and oriented to person, place, and time.  Psychiatric:  Blunted affect.          Assessment & Plan:

## 2016-07-13 NOTE — Assessment & Plan Note (Signed)
Assessment Review her blood sugar logs is notable only for 4 readings since 07/09/16, all of which have trended in the 300s to 400s. Her current diabetic medications include metformin 1000 mg twice daily, glargine 40 units at bedtime, Humalog 5 units in the morning and 16 units at bedtime, Byetta 5 g twice daily. She is frustrated with the number of times she sticks her self in checking her sugars.   In reviewing her meals, she eats 1 meal roughly at 3 PM consist of meat and other things that she can find. Throughout the day, she drinks 2 cans of 16 ounce coca colas though denies routine but acknowledges infrequent consumption of sugary juices and sports drinks.  Her last A1c was 13 which suggests poor control over a longer period of time and just over the interval in which she was taking her steroid therapy.  Plan -Counseled the patient on the importance of glycemic control and preventing long-term microvascular complications, like retinopathy, nephropathy, neuropathy -Discontinued Humalog 5 units the morning since she is no longer on steroid therapy -Suggested she check her sugars at least twice daily: Fasting in the morning, and either pre-or post prandial to assess response to Humalog -Consider up titrating Lantus at follow-up once we have additional blood sugar values to corroborate her glycemic control. She can also benefit minimally from increasing her current dosage of Byetta from 5 g twice daily to 10 g.

## 2016-07-23 ENCOUNTER — Encounter (HOSPITAL_COMMUNITY): Payer: Self-pay | Admitting: *Deleted

## 2016-07-23 ENCOUNTER — Emergency Department (HOSPITAL_COMMUNITY): Payer: Medicare Other

## 2016-07-23 ENCOUNTER — Inpatient Hospital Stay (HOSPITAL_COMMUNITY)
Admission: EM | Admit: 2016-07-23 | Discharge: 2016-07-25 | DRG: 065 | Disposition: A | Discharge status: Home / Self Care | Payer: Medicare Other | Attending: Internal Medicine | Admitting: Internal Medicine

## 2016-07-23 DIAGNOSIS — E871 Hypo-osmolality and hyponatremia: Secondary | ICD-10-CM | POA: Diagnosis not present

## 2016-07-23 DIAGNOSIS — H409 Unspecified glaucoma: Secondary | ICD-10-CM | POA: Diagnosis not present

## 2016-07-23 DIAGNOSIS — H5442 Blindness, left eye, normal vision right eye: Secondary | ICD-10-CM | POA: Diagnosis not present

## 2016-07-23 DIAGNOSIS — I10 Essential (primary) hypertension: Secondary | ICD-10-CM | POA: Diagnosis not present

## 2016-07-23 DIAGNOSIS — E1169 Type 2 diabetes mellitus with other specified complication: Secondary | ICD-10-CM | POA: Diagnosis not present

## 2016-07-23 DIAGNOSIS — F17200 Nicotine dependence, unspecified, uncomplicated: Secondary | ICD-10-CM | POA: Diagnosis present

## 2016-07-23 DIAGNOSIS — E1140 Type 2 diabetes mellitus with diabetic neuropathy, unspecified: Secondary | ICD-10-CM

## 2016-07-23 DIAGNOSIS — I639 Cerebral infarction, unspecified: Principal | ICD-10-CM | POA: Diagnosis present

## 2016-07-23 DIAGNOSIS — F40240 Claustrophobia: Secondary | ICD-10-CM | POA: Diagnosis present

## 2016-07-23 DIAGNOSIS — I6339 Cerebral infarction due to thrombosis of other cerebral artery: Secondary | ICD-10-CM | POA: Diagnosis not present

## 2016-07-23 DIAGNOSIS — I6789 Other cerebrovascular disease: Secondary | ICD-10-CM | POA: Diagnosis not present

## 2016-07-23 DIAGNOSIS — Z7982 Long term (current) use of aspirin: Secondary | ICD-10-CM

## 2016-07-23 DIAGNOSIS — R29810 Facial weakness: Secondary | ICD-10-CM | POA: Diagnosis not present

## 2016-07-23 DIAGNOSIS — R4701 Aphasia: Secondary | ICD-10-CM | POA: Diagnosis present

## 2016-07-23 DIAGNOSIS — E1165 Type 2 diabetes mellitus with hyperglycemia: Secondary | ICD-10-CM | POA: Diagnosis not present

## 2016-07-23 DIAGNOSIS — Z833 Family history of diabetes mellitus: Secondary | ICD-10-CM

## 2016-07-23 DIAGNOSIS — Z79899 Other long term (current) drug therapy: Secondary | ICD-10-CM | POA: Diagnosis not present

## 2016-07-23 DIAGNOSIS — F17210 Nicotine dependence, cigarettes, uncomplicated: Secondary | ICD-10-CM | POA: Diagnosis present

## 2016-07-23 DIAGNOSIS — IMO0002 Reserved for concepts with insufficient information to code with codable children: Secondary | ICD-10-CM | POA: Diagnosis present

## 2016-07-23 DIAGNOSIS — R4781 Slurred speech: Secondary | ICD-10-CM | POA: Diagnosis not present

## 2016-07-23 DIAGNOSIS — H532 Diplopia: Secondary | ICD-10-CM | POA: Diagnosis present

## 2016-07-23 DIAGNOSIS — F419 Anxiety disorder, unspecified: Secondary | ICD-10-CM | POA: Diagnosis present

## 2016-07-23 DIAGNOSIS — H5440 Blindness, one eye, unspecified eye: Secondary | ICD-10-CM | POA: Diagnosis present

## 2016-07-23 DIAGNOSIS — Z8249 Family history of ischemic heart disease and other diseases of the circulatory system: Secondary | ICD-10-CM | POA: Diagnosis not present

## 2016-07-23 DIAGNOSIS — F32A Depression, unspecified: Secondary | ICD-10-CM | POA: Diagnosis present

## 2016-07-23 DIAGNOSIS — Z823 Family history of stroke: Secondary | ICD-10-CM | POA: Diagnosis not present

## 2016-07-23 DIAGNOSIS — Z794 Long term (current) use of insulin: Secondary | ICD-10-CM

## 2016-07-23 DIAGNOSIS — E1149 Type 2 diabetes mellitus with other diabetic neurological complication: Secondary | ICD-10-CM

## 2016-07-23 DIAGNOSIS — E785 Hyperlipidemia, unspecified: Secondary | ICD-10-CM | POA: Diagnosis present

## 2016-07-23 DIAGNOSIS — F329 Major depressive disorder, single episode, unspecified: Secondary | ICD-10-CM | POA: Diagnosis present

## 2016-07-23 DIAGNOSIS — R471 Dysarthria and anarthria: Secondary | ICD-10-CM | POA: Diagnosis not present

## 2016-07-23 DIAGNOSIS — Z8673 Personal history of transient ischemic attack (TIA), and cerebral infarction without residual deficits: Secondary | ICD-10-CM | POA: Diagnosis present

## 2016-07-23 LAB — COMPREHENSIVE METABOLIC PANEL
ALT: 27 U/L (ref 14–54)
AST: 22 U/L (ref 15–41)
Albumin: 4.2 g/dL (ref 3.5–5.0)
Alkaline Phosphatase: 107 U/L (ref 38–126)
Anion gap: 12 (ref 5–15)
BUN: 7 mg/dL (ref 6–20)
CO2: 22 mmol/L (ref 22–32)
Calcium: 9.7 mg/dL (ref 8.9–10.3)
Chloride: 96 mmol/L — ABNORMAL LOW (ref 101–111)
Creatinine, Ser: 0.94 mg/dL (ref 0.44–1.00)
GFR calc Af Amer: >60 mL/min (ref >60)
GFR calc non Af Amer: >60 mL/min (ref >60)
Glucose, Bld: 559 mg/dL (ref 65–99)
Potassium: 4 mmol/L (ref 3.5–5.1)
Sodium: 130 mmol/L — ABNORMAL LOW (ref 135–145)
Total Bilirubin: 0.6 mg/dL (ref 0.3–1.2)
Total Protein: 7.8 g/dL (ref 6.5–8.1)

## 2016-07-23 LAB — I-STAT CHEM 8, ED
BUN: 4 mg/dL — ABNORMAL LOW (ref 6–20)
Calcium, Ion: 1.18 mmol/L (ref 1.12–1.23)
Chloride: 97 mmol/L — ABNORMAL LOW (ref 101–111)
Creatinine, Ser: 0.7 mg/dL (ref 0.44–1.00)
Glucose, Bld: 588 mg/dL (ref 65–99)
HCT: 45 % (ref 36.0–46.0)
Hemoglobin: 15.3 g/dL — ABNORMAL HIGH (ref 12.0–15.0)
Potassium: 4 mmol/L (ref 3.5–5.1)
Sodium: 133 mmol/L — ABNORMAL LOW (ref 135–145)
TCO2: 22 mmol/L (ref 0–100)

## 2016-07-23 LAB — DIFFERENTIAL
Basophils Absolute: 0 10*3/uL (ref 0.0–0.1)
Basophils Relative: 0 %
Eosinophils Absolute: 0.2 10*3/uL (ref 0.0–0.7)
Eosinophils Relative: 2 %
Lymphocytes Relative: 44 %
Lymphs Abs: 4.4 10*3/uL — ABNORMAL HIGH (ref 0.7–4.0)
Monocytes Absolute: 0.5 10*3/uL (ref 0.1–1.0)
Monocytes Relative: 5 %
Neutro Abs: 4.8 10*3/uL (ref 1.7–7.7)
Neutrophils Relative %: 49 %

## 2016-07-23 LAB — CBC
HCT: 40.3 % (ref 36.0–46.0)
Hemoglobin: 12.6 g/dL (ref 12.0–15.0)
MCH: 21.5 pg — ABNORMAL LOW (ref 26.0–34.0)
MCHC: 31.3 g/dL (ref 30.0–36.0)
MCV: 68.8 fL — ABNORMAL LOW (ref 78.0–100.0)
Platelets: 247 10*3/uL (ref 150–400)
RBC: 5.86 MIL/uL — ABNORMAL HIGH (ref 3.87–5.11)
RDW: 14.5 % (ref 11.5–15.5)
WBC: 9.9 10*3/uL (ref 4.0–10.5)

## 2016-07-23 LAB — PROTIME-INR
INR: 1.05
Prothrombin Time: 13.7 seconds (ref 11.4–15.2)

## 2016-07-23 LAB — CBG MONITORING, ED
Glucose-Capillary: 555 mg/dL (ref 65–99)
Glucose-Capillary: 445 mg/dL — ABNORMAL HIGH (ref 65–99)

## 2016-07-23 LAB — GLUCOSE, CAPILLARY: Glucose-Capillary: 381 mg/dL — ABNORMAL HIGH (ref 65–99)

## 2016-07-23 LAB — I-STAT TROPONIN, ED: Troponin i, poc: 0 ng/mL (ref 0.00–0.08)

## 2016-07-23 LAB — APTT: aPTT: 24 seconds (ref 24–36)

## 2016-07-23 MED ORDER — ACETAMINOPHEN 325 MG PO TABS: 650.0000 mg | ORAL_TABLET | Freq: Four times a day (QID) | ORAL | Status: DC | PRN

## 2016-07-23 MED ORDER — ATORVASTATIN CALCIUM 80 MG PO TABS
80.0000 mg | ORAL_TABLET | Freq: Every day | ORAL | Status: DC
  Administered 2016-07-23 – 2016-07-24 (×2): 80 mg via ORAL
  Filled 2016-07-23 (×2): qty 1

## 2016-07-23 MED ORDER — ENOXAPARIN SODIUM 40 MG/0.4ML ~~LOC~~ SOLN
40.0000 mg | SUBCUTANEOUS | Status: DC
  Administered 2016-07-23 – 2016-07-24 (×2): 40 mg via SUBCUTANEOUS
  Filled 2016-07-23 (×2): qty 0.4

## 2016-07-23 MED ORDER — INSULIN ASPART 100 UNIT/ML ~~LOC~~ SOLN
0.0000 [IU] | Freq: Every day | SUBCUTANEOUS | Status: DC
  Administered 2016-07-23: 5 [IU] via SUBCUTANEOUS
  Administered 2016-07-24: 4 [IU] via SUBCUTANEOUS

## 2016-07-23 MED ORDER — SENNOSIDES-DOCUSATE SODIUM 8.6-50 MG PO TABS: 1.0000 | ORAL_TABLET | Freq: Every evening | ORAL | Status: DC | PRN

## 2016-07-23 MED ORDER — SODIUM CHLORIDE 0.9 % IV SOLN
INTRAVENOUS | Status: AC
  Administered 2016-07-23: 22:00:00 via INTRAVENOUS

## 2016-07-23 MED ORDER — STROKE: EARLY STAGES OF RECOVERY BOOK
Freq: Once | Status: AC
  Administered 2016-07-23: 23:00:00
  Filled 2016-07-23: qty 1

## 2016-07-23 MED ORDER — INSULIN GLARGINE 100 UNIT/ML ~~LOC~~ SOLN
40.0000 [IU] | Freq: Every day | SUBCUTANEOUS | Status: DC
  Administered 2016-07-23: 40 [IU] via SUBCUTANEOUS
  Filled 2016-07-23 (×2): qty 0.4

## 2016-07-23 MED ORDER — ASPIRIN EC 81 MG PO TBEC
81.0000 mg | DELAYED_RELEASE_TABLET | Freq: Every day | ORAL | Status: DC
  Administered 2016-07-23 – 2016-07-25 (×3): 81 mg via ORAL
  Filled 2016-07-23 (×3): qty 1

## 2016-07-23 MED ORDER — DULOXETINE HCL 60 MG PO CPEP
60.0000 mg | ORAL_CAPSULE | Freq: Every day | ORAL | Status: DC
  Administered 2016-07-24 – 2016-07-25 (×2): 60 mg via ORAL
  Filled 2016-07-23 (×3): qty 1

## 2016-07-23 MED ORDER — TRAZODONE HCL 100 MG PO TABS
300.0000 mg | ORAL_TABLET | Freq: Every day | ORAL | Status: DC
  Administered 2016-07-23 – 2016-07-24 (×2): 300 mg via ORAL
  Filled 2016-07-23 (×2): qty 3

## 2016-07-23 MED ORDER — MIRTAZAPINE 15 MG PO TABS
30.0000 mg | ORAL_TABLET | Freq: Every day | ORAL | Status: DC
  Administered 2016-07-23 – 2016-07-24 (×2): 30 mg via ORAL
  Filled 2016-07-23 (×2): qty 2

## 2016-07-23 MED ORDER — NICOTINE 21 MG/24HR TD PT24
21.0000 mg | MEDICATED_PATCH | Freq: Every day | TRANSDERMAL | Status: DC
  Administered 2016-07-24 – 2016-07-25 (×2): 21 mg via TRANSDERMAL
  Filled 2016-07-23 (×2): qty 1

## 2016-07-23 MED ORDER — INSULIN ASPART 100 UNIT/ML ~~LOC~~ SOLN
0.0000 [IU] | Freq: Three times a day (TID) | SUBCUTANEOUS | Status: DC
Start: 2016-07-24 — End: 2016-07-25
  Administered 2016-07-24 (×2): 11 [IU] via SUBCUTANEOUS
  Administered 2016-07-24: 20 [IU] via SUBCUTANEOUS
  Administered 2016-07-25: 15 [IU] via SUBCUTANEOUS
  Administered 2016-07-25: 20 [IU] via SUBCUTANEOUS

## 2016-07-23 MED ORDER — CITALOPRAM HYDROBROMIDE 40 MG PO TABS
40.0000 mg | ORAL_TABLET | Freq: Every day | ORAL | Status: DC
  Administered 2016-07-24 – 2016-07-25 (×2): 40 mg via ORAL
  Filled 2016-07-23 (×3): qty 1

## 2016-07-23 MED ORDER — LORAZEPAM 1 MG PO TABS: 1.0000 mg | ORAL_TABLET | Freq: Once | ORAL | Status: DC

## 2016-07-23 MED ORDER — DOXEPIN HCL 25 MG PO CAPS
25.0000 mg | ORAL_CAPSULE | Freq: Every day | ORAL | Status: DC
  Administered 2016-07-23 – 2016-07-24 (×2): 25 mg via ORAL
  Filled 2016-07-23 (×2): qty 1

## 2016-07-23 NOTE — ED Triage Notes (Signed)
Pt reports waking up at 0800 with weakness and slurred speech.  Fell back to sleep, woke up at 1100, had trouble ambulating to the BR-states her balance is off.  Pt is A&Ox 4.  R facial droop and slurred speech noted.  R facial numbness reported as well.

## 2016-07-23 NOTE — ED Notes (Signed)
Called [facility] to give report  Was told they will call me back    Called care link  Spoke with provider for EMTALA

## 2016-07-23 NOTE — ED Notes (Signed)
PT used restroom unable to collect clean catch

## 2016-07-23 NOTE — ED Notes (Signed)
Attempts made to call report to [facility], staff states they are unsure who will be taking patient, RN will return call to [facility] for report.

## 2016-07-23 NOTE — ED Notes (Signed)
pts personal ipad and clothes given to family

## 2016-07-23 NOTE — ED Notes (Signed)
Family at bedside.  Brother and daughter

## 2016-07-23 NOTE — Progress Notes (Signed)
[facility] consulted for possible home health services. Patient lives at home alone.  Patient's grand daughter at bedside. Patient's daughter Ibis Steven [phone]. Patient reports she his usually able to complete her ADL's on her own without difficulty.  She inquired about "someone to help clean up." [facility] provided patiet with list of home health agencies in [place] [facility], explained services. EDCM provided patient with a lis t of private duty nursing agencies and informed patient it may be an out of pocket expense.  Patient verbalized understanding. Patient reports she does not have any medical equipment at home. Patient's grand daughter reports patient has a stair lift at home. Patient is requesting to eat.  EDRN made aware. No further EDCM needs at this time.

## 2016-07-23 NOTE — H&P (Addendum)
History and Physical    [name] [id number] DOB: 1956-03-22 DOA: 07/23/2016  PCP: Shela Leff, MD Consultants:  None Patient coming from: Lives with granddaughter  Chief Complaint:    HPI: Susan Holmes is a 60 y.o. female with medical history significant of HTN, HLD, DM, and tobacco abuse presenting with acute vs. subacute CVA.  Patient reports that when she tried to get up this morning her legs felt very off balance, was weak.  Was able to finally get up and around but had to hold onto things.  Reports has not been sleeping well for about a month, has been up and down through the night.  Doesn't remember if her legs felt ok overnight.  Is having trouble with speech - getting words out and also slurring.  She reports this is new today.  No trouble swallowing.  No UE symptoms.  Has left eye blindness chronically, but does notice some vision changes in right eye, possibly blurring.  Her daughter reports that the patient's brother was calling and she wouldn't answer the phone.  He went to check on her - she could not speak well, face was twisted (R sided facial droop is better now).  Daughter checked on her 2 days ago and she was complaining of headache and not sleeping then, "off, like something's not right."  Granddaughter described similar presentation yesterday other than severe headache.  Other symptoms appear to have happened overnight or today.    ED Course: Based on time from initial symptoms, deemed not to be a tPA candidate.  CT does show apparent new CVA.  Neuro consulted and will arrange transfer to IM teaching service at [facility].  Review of Systems: As per HPI; otherwise 10 point review of systems reviewed and negative.   Ambulatory Status: ambulated independently prior to this  Past Medical History:  Diagnosis Date  . Abnormal stress test   . Anxiety   . Arthritis   . Congenital blindness    L eye  . Depression   . Diabetes mellitus, type II ([place])   . Glaucoma    . Hepatitis B infection pt unsure   resolved  . HTN (hypertension)   . Hyperlipidemia   . Tobacco abuse    .5 PPD smoker    Past Surgical History:  Procedure Laterality Date  . ABDOMINAL HYSTERECTOMY    . LEFT HEART CATHETERIZATION WITH CORONARY ANGIOGRAM N/A 10/09/2014   Procedure: LEFT HEART CATHETERIZATION WITH CORONARY ANGIOGRAM;  Surgeon: Troy Sine, MD;  Location: [facility] CATH LAB;  Service: Cardiovascular;  Laterality: N/A;    Social History   Social History  . Marital status: Divorced    Spouse name: N/A  . Number of children: 1  . Years of education: 18   Occupational History  .  Unemployed   Social History Main Topics  . Smoking status: Current Every Day Smoker    Packs/day: 0.60    Years: 20.00    Types: Cigarettes  . Smokeless tobacco: Current User    Types: Chew     Comment: Nicorette gum./ LESS 1/2PPD  . Alcohol use No  . Drug use: No  . Sexual activity: Not Currently    Birth control/ protection: Surgical   Other Topics Concern  . Not on file   Social History Narrative  . No narrative on file    Allergies  Allergen Reactions  . Ace Inhibitors Cough    Family History  Problem Relation Age of Onset  . Heart  disease Mother 28  . Hypertension Mother   . Heart disease Father 34  . CVA Sister   . Aneurysm Brother   . Aneurysm Sister   . CVA Maternal Grandmother   . CVA Maternal Grandfather   . Colon cancer Neg Hx   . Esophageal cancer Neg Hx   . Stomach cancer Neg Hx     Prior to Admission medications   Medication Sig Start Date End Date Taking? Authorizing Provider  amLODipine (NORVASC) 5 MG tablet TAKE 1 TABLET(5 MG) BY MOUTH DAILY 10/10/15  Yes Shela Leff, MD  aspirin 81 MG EC tablet TAKE 1 TABLET BY MOUTH EVERY DAY Patient taking differently: TAKE 81 MG BY MOUTH EVERY DAY 03/10/15  Yes Hoyt Koch, MD  citalopram (CELEXA) 40 MG tablet Take 40 mg by mouth daily.   Yes Historical Provider, MD  doxepin (SINEQUAN) 25 MG  capsule Take 25 mg by mouth at bedtime.   Yes Historical Provider, MD  DULoxetine (CYMBALTA) 60 MG capsule Take 1 capsule (60 mg total) by mouth daily. 11/18/15 11/17/16 Yes Shela Leff, MD  exenatide (BYETTA 10 MCG PEN) 10 MCG/0.04ML SOPN injection Inject 0.04 mLs (10 mcg total) into the skin 2 (two) times daily with a meal. 05/04/16  Yes Shela Leff, MD  Insulin Glargine (LANTUS SOLOSTAR) 100 UNIT/ML Solostar Pen Inject 40 Units into the skin at bedtime. 09/29/15  Yes Shela Leff, MD  insulin lispro (HUMALOG KWIKPEN) 100 UNIT/ML KiwkPen Inject 8 units with lunch and 12 units with dinner. Eat meals within 10 minutes of injection. Patient taking differently: Inject 16 Units into the skin daily.  10/06/15  Yes Shela Leff, MD  losartan (COZAAR) 50 MG tablet TAKE 1 TABLET(50 MG) BY MOUTH TWICE DAILY 03/29/16  Yes Shela Leff, MD  metFORMIN (GLUCOPHAGE) 1000 MG tablet Take 1070m (1 tablets) 2 times a day. Patient taking differently: Take 1,000 mg by mouth 2 (two) times daily with a meal.  03/26/16  Yes VShela Leff MD  mirtazapine (REMERON) 30 MG tablet Take 30 mg by mouth at bedtime.  04/02/15  Yes Historical Provider, MD  traZODone (DESYREL) 100 MG tablet Take 300 mg by mouth at bedtime.    Yes Historical Provider, MD  ACCU-CHEK FASTCLIX LANCETS MISC Check blood sugar 3x a day as instructed dx cod 250.00 insulin requiring 12/04/14   KOtho Bellows MD  benzonatate (TESSALON) 100 MG capsule Take 1 capsule (100 mg total) by mouth every 8 (eight) hours. Patient not taking: Reported on 07/23/2016 06/27/16   KRecardo Evangelist PA-C  Blood Glucose Calibration (ACCU-CHEK SMARTVIEW CONTROL) LIQD  05/30/15   Historical Provider, MD  Blood Glucose Monitoring Suppl (ACCU-CHEK AVIVA PLUS) W/DEVICE KIT Use to check blood sugar 3 to 4 times daily. diag code 250.62. Insulin dependent 09/11/14   NAldine Contes MD  famotidine (PEPCID) 20 MG tablet Take 1 tablet (20 mg total) by mouth 2 (two)  times daily. Patient not taking: Reported on 07/23/2016 07/13/16   RRiccardo Dubin MD  glucose blood (ACCU-CHEK AVIVA PLUS) test strip Use to test blood sugar 3 times daily. diag code E11.41. Insulin dependent 03/05/16   DAxel Filler MD  Insulin Pen Needle (VALUMARK PEN NEEDLES) 31G X 8 MM MISC 1 each by Does not apply route daily. 11/20/14   AFrancesca Oman DO  Lancet Devices (ADJUSTABLE LANCING DEVICE) MISC 1 each.  03/17/15   Historical Provider, MD    Physical Exam: Vitals:   07/23/16 1700 07/23/16 1730 07/23/16 1749  07/23/16 1800  BP: 160/91 131/79 139/88 153/91  Pulse: 78 (!) 52 78 75  Resp: _0 Temp:      TempSrc:      SpO2: 95% 95% 95% 95%  Weight:      Height:         General:  Appears calm and comfortable and is NAD; does not make (or attempt to make) good eye contact, seems somewhat aloof, slurring of speech and word finding difficulties noted Eyes:  PERRL, EOMI, normal lids, iris ENT:  grossly normal hearing, lips & tongue, mmm Neck:  no LAD, masses or thyromegaly, no bruits Cardiovascular:  RRR, no m/r/g. No LE edema.  Respiratory:  CTA bilaterally, no w/r/r. Normal respiratory effort. Abdomen:  soft, ntnd, NABS Skin:  no rash or induration seen on limited exam Musculoskeletal:  grossly normal tone BUE/BLE, good ROM, no bony abnormality Psychiatric: blunted affect, speech slurred and with word finding difficulties, AOx3 Neurologic:mild facial flaccidity on right without obvious droop, mild left-sided tongue deviation; normal UE strength, symmetric; slightly decreased L hip strength but symmetric distally; hyporeflexic bilaterally; 2 beats clonus on L, 1 on right; decreased sensation of L leg distally   Labs on Admission: I have personally reviewed following labs and imaging studies  CBC:  Recent Labs Lab 07/23/16 1512 07/23/16 1516  WBC  --  9.9  NEUTROABS  --  4.8  HGB 15.3* 12.6  HCT 45.0 40.3  MCV  --  68.8*  PLT  --  962   Basic  Metabolic Panel:  Recent Labs Lab 07/23/16 1512 07/23/16 1516  NA 133* 130*  K 4.0 4.0  CL 97* 96*  CO2  --  22  GLUCOSE 588* 559*  BUN 4* 7  CREATININE 0.70 0.94  CALCIUM  --  9.7   GFR: Estimated Creatinine Clearance: 68.7 mL/min (by C-G formula based on SCr of 0.94 mg/dL). Liver Function Tests:  Recent Labs Lab 07/23/16 1516  AST 22  ALT 27  ALKPHOS 107  BILITOT 0.6  PROT 7.8  ALBUMIN 4.2   No results for input(s): LIPASE, AMYLASE in the last 168 hours. No results for input(s): AMMONIA in the last 168 hours. Coagulation Profile:  Recent Labs Lab 07/23/16 1516  INR 1.05   Cardiac Enzymes: No results for input(s): CKTOTAL, CKMB, CKMBINDEX, TROPONINI in the last 168 hours. BNP (last 3 results) No results for input(s): PROBNP in the last 8760 hours. HbA1C: No results for input(s): HGBA1C in the last 72 hours. CBG:  Recent Labs Lab 07/23/16 1452  GLUCAP 555*   Lipid Profile: No results for input(s): CHOL, HDL, LDLCALC, TRIG, CHOLHDL, LDLDIRECT in the last 72 hours. Thyroid Function Tests: No results for input(s): TSH, T4TOTAL, FREET4, T3FREE, THYROIDAB in the last 72 hours. Anemia Panel: No results for input(s): VITAMINB12, FOLATE, FERRITIN, TIBC, IRON, RETICCTPCT in the last 72 hours. Urine analysis:    Component Value Date/Time   COLORURINE YELLOW 12/11/2014 1008   APPEARANCEUR CLEAR 12/11/2014 1008   LABSPEC 1.030 12/11/2014 1008   PHURINE 6.0 12/11/2014 1008   GLUCOSEU > 1000 (A) 12/11/2014 1008   HGBUR NEG 12/11/2014 1008   BILIRUBINUR NEG 12/11/2014 1008   BILIRUBINUR Negative 03/06/2013 1608   KETONESUR NEG 12/11/2014 1008   PROTEINUR NEG 12/11/2014 1008   UROBILINOGEN 0.2 12/11/2014 1008   NITRITE NEG 12/11/2014 1008   LEUKOCYTESUR NEG 12/11/2014 1008    Creatinine Clearance: Estimated Creatinine Clearance: 68.7 mL/min (by C-G formula based on SCr of 0.94  mg/dL).  Sepsis Labs: _0 (procalcitonin:4,lacticidven:4) )No results  found for this or any previous visit (from the past 240 hour(s)).   Radiological Exams on Admission: Ct Head Code Stroke W/o Cm  Result Date: 07/23/2016 CLINICAL DATA:  Weakness and slurred speech for 7.5 hours, initial encounter EXAM: CT HEAD WITHOUT CONTRAST TECHNIQUE: Contiguous axial images were obtained from the base of the skull through the vertex without intravenous contrast. COMPARISON:  02/10/2014 FINDINGS: The bony calvarium is intact. No gross soft tissue abnormality is noted. There is 1 cm area of decreased attenuation identified on the right in the area of the extreme capsule and right basal ganglia posteriorly. This was not present on a prior exam and is consistent with an area of evolving ischemia. This likely is acute to subacute in nature. No findings to suggest acute hemorrhage or space-occupying mass lesion are noted. No other areas of ischemia are seen. IMPRESSION: Area of evolving ischemia on the right as described. Critical Value/emergent results were called by telephone at the time of interpretation on 07/23/2016 at 3:39 pm to Dr. Tanna Furry , who verbally acknowledged these results. Electronically Signed   By: Inez Catalina M.D.   On: 07/23/2016 15:40   EKG: Independently reviewed.  NSR with rate 91; nonspecific ST changes with no evidence of acute ischemia  Assessment/Plan Principal Problem:   CVA (cerebrovascular accident) ([place]) Active Problems:   Type II diabetes mellitus with neurological manifestations, uncontrolled ([place])   Hyperlipidemia associated with type 2 diabetes mellitus ([place])   TOBACCO ABUSE   Depression   CONGENITAL BLINDNESS, LEFT EYE   Essential hypertension   CVA -Acute vs. Subacute; based on projected time course of symptoms, patient is not a tPA candidate -Will admit to [facility] under IM service -Risk factor modification (see below) -Permissive HTN - will not continue home medications for now for blood pressure control; based on AHA guidelines, would  treat only for BP >220/120 and then with goal to decrease BP by 15% with either labetolol 10 mg q10-20 min or Nicardipine drip -Has not been consistently taking ASA daily so it is appropriate to continue ASA daily (consider 37m vs. 325 mg) -Further evaluation with carotid doppler; MRI/A (will need anesthesia based on patient report of claustrophobia); Echo -Neurology consult requested - this needs follow up as the ER has not yet been able to reach the on-call provider -Reportedly passed swallow evaluation; if so, may eat heart healthy, carb modified diet -SLP, PT, OT consults  HTN -As above, permissive HTN for now -Home meds include Norvasc and Cozaar -Will monitor without meds for now  DM -On Lantus as well as Humalog (not taking as prescribed), Byetta, and Glucophage -It may be beneficial to try to simplify her regimen, as it appears that compliance is an issue -A1c ordered, but expected to be high based on presenting glucose of 588(!) -Continue Lantus, SSI for now -Hold oral medications for now  HLD -Lipids checked 1 year ago and showed triglycerides 346, TC 231, good HDL, LDL 124 -Repeat FLP -Start high-dose Lipitor for now and taper as appropriate  Tobacco abuse -Encourage cessation.  This was discussed with the patient and should be reviewed on an ongoing basis.   -Patch ordered at patient request.  Depression -Patient is on a large number of mood-related and sleep-inducing medications -Psychiatry consult may be beneficial depending on stroke progression, as she did appear to have blunted affect tonight and the burden of this disease may be particularly problematic for her -Will continue  all pysch meds for now as per med rec -As noted above, severe claustrophobia so reports need for anesthesia for MRI -Consider CTA as an alternative if patient is unable to have MRI/A   DVT prophylaxis: Lovenox Code Status: Full - confirmed with patient/family Family Communication:  Daughter present throughout evaluation Disposition Plan: To be determined based on therapy recommendations Consults called: Neurology (may need repeat call); SLP, PT, OT ordered  Admission status: Admit to [facility] telemetry, IM Residency Service   Karmen Bongo MD [facility]  If 7PM-7AM, please contact night-coverage [email or web address] Password [facility]  07/23/2016, 6:23 PM

## 2016-07-23 NOTE — ED Notes (Signed)
MD at bedside. 

## 2016-07-23 NOTE — Consult Note (Signed)
Admission H&P    Chief Complaint: New onset left-sided weakness and slurred speech.  HPI: Susan Holmes is an 60 y.o. female with a history of hypertension, diabetes mellitus, hyperlipidemia and tobacco abuse, presenting with new onset weakness involving left side as well as slurred speech. Patient woke up with deficits this morning. She was last known well at 10 PM last night. She has no history of stroke nor TIA. She has not been taking aspirin on a regular basis. CT scan of her head showed an area of hypodensity involving right basal ganglia and extreme capsule, indicative of you to subacute ischemic infarction. Deficits have improved since admission. NIH stroke score the time of this evaluation was 0  LSN: 10 PM on 07/22/2016 tPA Given: No: Deficits improved mRankin:  Past Medical History:  Diagnosis Date  . Abnormal stress test   . Anxiety   . Arthritis   . Congenital blindness    L eye  . Depression   . Diabetes mellitus, type II ([place])   . Glaucoma   . Hepatitis B infection pt unsure   resolved  . HTN (hypertension)   . Hyperlipidemia   . Tobacco abuse    .5 PPD smoker    Past Surgical History:  Procedure Laterality Date  . ABDOMINAL HYSTERECTOMY    . LEFT HEART CATHETERIZATION WITH CORONARY ANGIOGRAM N/A 10/09/2014   Procedure: LEFT HEART CATHETERIZATION WITH CORONARY ANGIOGRAM;  Surgeon: Troy Sine, MD;  Location: [facility] CATH LAB;  Service: Cardiovascular;  Laterality: N/A;    Family History  Problem Relation Age of Onset  . Heart disease Mother 77  . Hypertension Mother   . Heart disease Father 40  . CVA Sister   . Aneurysm Brother   . Aneurysm Sister   . CVA Maternal Grandmother   . CVA Maternal Grandfather   . Colon cancer Neg Hx   . Esophageal cancer Neg Hx   . Stomach cancer Neg Hx    Social History:  reports that she has been smoking Cigarettes.  She has a 12.00 pack-year smoking history. Her smokeless tobacco use includes Chew. She reports that she  does not drink alcohol or use drugs.  Allergies:  Allergies  Allergen Reactions  . Ace Inhibitors Cough    Medications Prior to Admission  Medication Sig Dispense Refill  . amLODipine (NORVASC) 5 MG tablet TAKE 1 TABLET(5 MG) BY MOUTH DAILY 90 tablet 3  . aspirin 81 MG EC tablet TAKE 1 TABLET BY MOUTH EVERY DAY (Patient taking differently: TAKE 81 MG BY MOUTH EVERY DAY) 30 tablet 0  . citalopram (CELEXA) 40 MG tablet Take 40 mg by mouth daily.    Marland Kitchen doxepin (SINEQUAN) 25 MG capsule Take 25 mg by mouth at bedtime.    . DULoxetine (CYMBALTA) 60 MG capsule Take 1 capsule (60 mg total) by mouth daily. 30 capsule 1  . exenatide (BYETTA 10 MCG PEN) 10 MCG/0.04ML SOPN injection Inject 0.04 mLs (10 mcg total) into the skin 2 (two) times daily with a meal. 2.4 mL 3  . Insulin Glargine (LANTUS SOLOSTAR) 100 UNIT/ML Solostar Pen Inject 40 Units into the skin at bedtime. 15 mL 3  . insulin lispro (HUMALOG KWIKPEN) 100 UNIT/ML KiwkPen Inject 8 units with lunch and 12 units with dinner. Eat meals within 10 minutes of injection. (Patient taking differently: Inject 16 Units into the skin daily. ) 15 mL 3  . losartan (COZAAR) 50 MG tablet TAKE 1 TABLET(50 MG) BY MOUTH TWICE DAILY 180  tablet 3  . metFORMIN (GLUCOPHAGE) 1000 MG tablet Take 1066m (1 tablets) 2 times a day. (Patient taking differently: Take 1,000 mg by mouth 2 (two) times daily with a meal. ) 60 tablet 3  . mirtazapine (REMERON) 30 MG tablet Take 30 mg by mouth at bedtime.     . traZODone (DESYREL) 100 MG tablet Take 300 mg by mouth at bedtime.     .Marland KitchenACCU-CHEK FASTCLIX LANCETS MISC Check blood sugar 3x a day as instructed dx cod 250.00 insulin requiring 102 each 5  . Blood Glucose Calibration (ACCU-CHEK SMARTVIEW CONTROL) LIQD     . Blood Glucose Monitoring Suppl (ACCU-CHEK AVIVA PLUS) W/DEVICE KIT Use to check blood sugar 3 to 4 times daily. diag code 250.62. Insulin dependent 1 kit 0  . glucose blood (ACCU-CHEK AVIVA PLUS) test strip Use to  test blood sugar 3 times daily. diag code E11.41. Insulin dependent 100 each 11  . Insulin Pen Needle (VALUMARK PEN NEEDLES) 31G X 8 MM MISC 1 each by Does not apply route daily. 100 each 3  . Lancet Devices (ADJUSTABLE LANCING DEVICE) MISC 1 each.       ROS: History obtained from the patient  General ROS: negative for - chills, fatigue, fever, night sweats, weight gain or weight loss Psychological ROS: negative for - behavioral disorder, hallucinations, memory difficulties, mood swings or suicidal ideation Ophthalmic ROS: negative for - blurry vision, double vision, eye pain or loss of vision ENT ROS: negative for - epistaxis, nasal discharge, oral lesions, sore throat, tinnitus or vertigo Allergy and Immunology ROS: negative for - hives or itchy/watery eyes Hematological and Lymphatic ROS: negative for - bleeding problems, bruising or swollen lymph nodes Endocrine ROS: negative for - galactorrhea, hair pattern changes, polydipsia/polyuria or temperature intolerance Respiratory ROS: negative for - cough, hemoptysis, shortness of breath or wheezing Cardiovascular ROS: negative for - chest pain, dyspnea on exertion, edema or irregular heartbeat Gastrointestinal ROS: negative for - abdominal pain, diarrhea, hematemesis, nausea/vomiting or stool incontinence Genito-Urinary ROS: negative for - dysuria, hematuria, incontinence or urinary frequency/urgency Musculoskeletal ROS: negative for - joint swelling or muscular weakness Neurological ROS: as noted in HPI Dermatological ROS: negative for rash and skin lesion changes  Physical Examination: Blood pressure (!) 167/103, pulse 64, temperature 97.9 F (36.6 C), temperature source Oral, resp. rate 20, height '5\' 7"'  (1.702 m), weight 79.4 kg (175 lb 1.6 oz), SpO2 98 %.  HEENT-  Normocephalic, no lesions, without obvious abnormality.  Normal external eye and conjunctiva.  Normal TM's bilaterally.  Normal auditory canals and external ears. Normal  external nose, mucus membranes and septum.  Normal pharynx. Neck supple with no masses, nodes, nodules or enlargement. Cardiovascular - regular rate and rhythm, S1, S2 normal, no murmur, click, rub or gallop Lungs - chest clear, no wheezing, rales, normal symmetric air entry Abdomen - soft, non-tender; bowel sounds normal; no masses,  no organomegaly Extremities - no joint deformities, effusion, or inflammation and no edema  Neurologic Examination: Mental Status: Alert, oriented, thought content appropriate.  Speech fluent without evidence of aphasia. Able to follow commands without difficulty. Cranial Nerves: II-Visual fields were normal. III/IV/VI-Pupils were equal and reacted normally to light. Extraocular movements were full and conjugate.    V/VII-no facial numbness and no facial weakness. VIII-normal. X-normal speech and symmetrical palatal movement. XI: trapezius strength/neck flexion strength normal bilaterally XII-midline tongue extension with normal strength. Motor: 5/5 bilaterally with normal tone and bulk Sensory: Normal throughout. Deep Tendon Reflexes: 1+ and symmetric in  upper extremities and absent in lower extremities. Plantars: Flexor bilaterally Cerebellar: Normal finger-to-nose testing. Carotid auscultation: Normal  Results for orders placed or performed during the hospital encounter of 07/23/16 (from the past 48 hour(s))  CBG monitoring, ED     Status: Abnormal   Collection Time: 07/23/16  2:52 PM  Result Value Ref Range   Glucose-Capillary 555 (HH) 65 - 99 mg/dL   Comment 1 Notify RN   I-stat troponin, ED     Status: None   Collection Time: 07/23/16  3:09 PM  Result Value Ref Range   Troponin i, poc 0.00 0.00 - 0.08 ng/mL   Comment 3            Comment: Due to the release kinetics of cTnI, a negative result within the first hours of the onset of symptoms does not rule out myocardial infarction with certainty. If myocardial infarction is still  suspected, repeat the test at appropriate intervals.   I-Stat Chem 8, ED     Status: Abnormal   Collection Time: 07/23/16  3:12 PM  Result Value Ref Range   Sodium 133 (L) 135 - 145 mmol/L   Potassium 4.0 3.5 - 5.1 mmol/L   Chloride 97 (L) 101 - 111 mmol/L   BUN 4 (L) 6 - 20 mg/dL   Creatinine, Ser 0.70 0.44 - 1.00 mg/dL   Glucose, Bld 588 (HH) 65 - 99 mg/dL   Calcium, Ion 1.18 1.12 - 1.23 mmol/L   TCO2 22 0 - 100 mmol/L   Hemoglobin 15.3 (H) 12.0 - 15.0 g/dL   HCT 45.0 36.0 - 46.0 %   Comment NOTIFIED PHYSICIAN   Protime-INR     Status: None   Collection Time: 07/23/16  3:16 PM  Result Value Ref Range   Prothrombin Time 13.7 11.4 - 15.2 seconds   INR 1.05   APTT     Status: None   Collection Time: 07/23/16  3:16 PM  Result Value Ref Range   aPTT 24 24 - 36 seconds  CBC     Status: Abnormal   Collection Time: 07/23/16  3:16 PM  Result Value Ref Range   WBC 9.9 4.0 - 10.5 K/uL   RBC 5.86 (H) 3.87 - 5.11 MIL/uL   Hemoglobin 12.6 12.0 - 15.0 g/dL   HCT 40.3 36.0 - 46.0 %   MCV 68.8 (L) 78.0 - 100.0 fL   MCH 21.5 (L) 26.0 - 34.0 pg   MCHC 31.3 30.0 - 36.0 g/dL   RDW 14.5 11.5 - 15.5 %   Platelets 247 150 - 400 K/uL  Differential     Status: Abnormal   Collection Time: 07/23/16  3:16 PM  Result Value Ref Range   Neutrophils Relative % 49 %   Lymphocytes Relative 44 %   Monocytes Relative 5 %   Eosinophils Relative 2 %   Basophils Relative 0 %   Neutro Abs 4.8 1.7 - 7.7 K/uL   Lymphs Abs 4.4 (H) 0.7 - 4.0 K/uL   Monocytes Absolute 0.5 0.1 - 1.0 K/uL   Eosinophils Absolute 0.2 0.0 - 0.7 K/uL   Basophils Absolute 0.0 0.0 - 0.1 K/uL   RBC Morphology ELLIPTOCYTES   Comprehensive metabolic panel     Status: Abnormal   Collection Time: 07/23/16  3:16 PM  Result Value Ref Range   Sodium 130 (L) 135 - 145 mmol/L   Potassium 4.0 3.5 - 5.1 mmol/L   Chloride 96 (L) 101 - 111 mmol/L   CO2 22 22 -  32 mmol/L   Glucose, Bld 559 (HH) 65 - 99 mg/dL    Comment: CRITICAL RESULT  CALLED TO, READ BACK BY AND VERIFIED WITH: NEASE,A RN AT 1555 ON 7.28.17 BY EPPERSON,S    BUN 7 6 - 20 mg/dL   Creatinine, Ser 0.94 0.44 - 1.00 mg/dL   Calcium 9.7 8.9 - 10.3 mg/dL   Total Protein 7.8 6.5 - 8.1 g/dL   Albumin 4.2 3.5 - 5.0 g/dL   AST 22 15 - 41 U/L   ALT 27 14 - 54 U/L   Alkaline Phosphatase 107 38 - 126 U/L   Total Bilirubin 0.6 0.3 - 1.2 mg/dL   GFR calc non Af Amer >60 >60 mL/min   GFR calc Af Amer >60 >60 mL/min    Comment: (NOTE) The eGFR has been calculated using the CKD EPI equation. This calculation has not been validated in all clinical situations. eGFR's persistently <60 mL/min signify possible Chronic Kidney Disease.    Anion gap 12 5 - 15  CBG monitoring, ED     Status: Abnormal   Collection Time: 07/23/16  8:48 PM  Result Value Ref Range   Glucose-Capillary 445 (H) 65 - 99 mg/dL  Glucose, capillary     Status: Abnormal   Collection Time: 07/23/16 10:14 PM  Result Value Ref Range   Glucose-Capillary 381 (H) 65 - 99 mg/dL   Comment 1 Notify RN    Comment 2 Document in Chart    Ct Head Code Stroke W/o Cm  Result Date: 07/23/2016 CLINICAL DATA:  Weakness and slurred speech for 7.5 hours, initial encounter EXAM: CT HEAD WITHOUT CONTRAST TECHNIQUE: Contiguous axial images were obtained from the base of the skull through the vertex without intravenous contrast. COMPARISON:  02/10/2014 FINDINGS: The bony calvarium is intact. No gross soft tissue abnormality is noted. There is 1 cm area of decreased attenuation identified on the right in the area of the extreme capsule and right basal ganglia posteriorly. This was not present on a prior exam and is consistent with an area of evolving ischemia. This likely is acute to subacute in nature. No findings to suggest acute hemorrhage or space-occupying mass lesion are noted. No other areas of ischemia are seen. IMPRESSION: Area of evolving ischemia on the right as described. Critical Value/emergent results were called  by telephone at the time of interpretation on 07/23/2016 at 3:39 pm to Dr. Tanna Furry , who verbally acknowledged these results. Electronically Signed   By: Inez Catalina M.D.   On: 07/23/2016 15:40   Assessment: 60 y.o. female with multiple risk factors for stroke presenting with likely right subcortical acute ischemic infarction.  Stroke Risk Factors - diabetes mellitus, family history, hyperlipidemia, hypertension and smoking  Plan: 1. HgbA1c, fasting lipid panel 2. MRI, MRA  of the brain without contrast 3. PT consult, OT consult, Speech consult 4. Echocardiogram 5. Carotid dopplers 6. Prophylactic therapy-Antiplatelet med: Aspirin  7. Risk factor modification 8. Telemetry monitoring  C.R. Nicole Kindred, MD [facility] [phone]  07/23/2016, 10:42 PM

## 2016-07-24 ENCOUNTER — Inpatient Hospital Stay (HOSPITAL_COMMUNITY): Payer: Medicare Other

## 2016-07-24 DIAGNOSIS — F329 Major depressive disorder, single episode, unspecified: Secondary | ICD-10-CM

## 2016-07-24 DIAGNOSIS — I6339 Cerebral infarction due to thrombosis of other cerebral artery: Secondary | ICD-10-CM

## 2016-07-24 DIAGNOSIS — I10 Essential (primary) hypertension: Secondary | ICD-10-CM

## 2016-07-24 DIAGNOSIS — I639 Cerebral infarction, unspecified: Principal | ICD-10-CM

## 2016-07-24 DIAGNOSIS — E785 Hyperlipidemia, unspecified: Secondary | ICD-10-CM

## 2016-07-24 DIAGNOSIS — Z794 Long term (current) use of insulin: Secondary | ICD-10-CM

## 2016-07-24 DIAGNOSIS — E1165 Type 2 diabetes mellitus with hyperglycemia: Secondary | ICD-10-CM

## 2016-07-24 DIAGNOSIS — I6789 Other cerebrovascular disease: Secondary | ICD-10-CM

## 2016-07-24 DIAGNOSIS — F17210 Nicotine dependence, cigarettes, uncomplicated: Secondary | ICD-10-CM

## 2016-07-24 LAB — ECHOCARDIOGRAM COMPLETE
E decel time: 187 msec
E/e' ratio: 9.46
FS: 28 % (ref 28–44)
Height: 67 in
IVS/LV PW RATIO, ED: 1.12
LA ID, A-P, ES: 34 mm
LA diam end sys: 34 mm
LA diam index: 1.78 cm/m2
LA vol A4C: 37.2 ml
LA vol index: 20.1 mL/m2
LA vol: 38.4 mL
LV E/e' medial: 9.46
LV E/e'average: 9.46
LV PW d: 7.42 mm — AB (ref 0.6–1.1)
LV e' LATERAL: 7.07 cm/s
LVOT area: 2.84 cm2
LVOT diameter: 19 mm
Lateral S' vel: 15.1 cm/s
MV Dec: 187
MV pk A vel: 91.7 m/s
MV pk E vel: 66.9 m/s
TAPSE: 22.4 mm
TDI e' lateral: 7.07
TDI e' medial: 5.55
Weight: 2801.6 oz

## 2016-07-24 LAB — GLUCOSE, CAPILLARY
Glucose-Capillary: 407 mg/dL — ABNORMAL HIGH (ref 65–99)
Glucose-Capillary: 292 mg/dL — ABNORMAL HIGH (ref 65–99)
Glucose-Capillary: 277 mg/dL — ABNORMAL HIGH (ref 65–99)
Glucose-Capillary: 339 mg/dL — ABNORMAL HIGH (ref 65–99)

## 2016-07-24 LAB — LIPID PANEL
Cholesterol: 265 mg/dL — ABNORMAL HIGH (ref 0–200)
HDL: 30 mg/dL — ABNORMAL LOW (ref >40)
LDL Cholesterol: 164 mg/dL — ABNORMAL HIGH (ref 0–99)
Total CHOL/HDL Ratio: 8.8 RATIO
Triglycerides: 357 mg/dL — ABNORMAL HIGH (ref <150)
VLDL: 71 mg/dL — ABNORMAL HIGH (ref 0–40)

## 2016-07-24 MED ORDER — INSULIN GLARGINE 100 UNIT/ML ~~LOC~~ SOLN
44.0000 [IU] | Freq: Every day | SUBCUTANEOUS | Status: DC
  Administered 2016-07-24: 44 [IU] via SUBCUTANEOUS
  Filled 2016-07-24 (×2): qty 0.44

## 2016-07-24 MED ORDER — LORAZEPAM 2 MG/ML IJ SOLN: 1.0000 mg | Freq: Once | INTRAMUSCULAR | Status: DC

## 2016-07-24 MED ORDER — LORAZEPAM 2 MG/ML IJ SOLN
1.0000 mg | Freq: Once | INTRAMUSCULAR | Status: AC | PRN
  Administered 2016-07-24: 1 mg via INTRAVENOUS
  Filled 2016-07-24: qty 1

## 2016-07-24 NOTE — Progress Notes (Signed)
STROKE TEAM PROGRESS NOTE   HISTORY OF PRESENT ILLNESS (per record) Susan Holmes is an 60 y.o. female with a history of hypertension, diabetes mellitus, hyperlipidemia and tobacco abuse, presenting with new onset weakness involving left side as well as slurred speech. Patient woke up with deficits this morning. She was last known well at 10 PM last night. She has no history of stroke nor TIA. She has not been taking aspirin on a regular basis. CT scan of her head showed an area of hypodensity involving right basal ganglia and extreme capsule, indicative of you to subacute ischemic infarction. Deficits have improved since admission. NIH stroke score the time of this evaluation was 0  LSN: 10 PM on 07/22/2016 tPA Given: No: Deficits improved mRankin:   SUBJECTIVE (INTERVAL HISTORY) Her family was not at the bedside.  Overall she feels her condition is unchanged. ST at bedside to work with patient.  Full ROS was negative  OBJECTIVE Temp:  [97.7 F (36.5 C)-98.7 F (37.1 C)] 97.9 F (36.6 C) (07/29 0948) Pulse Rate:  [52-88] 75 (07/29 0948) Cardiac Rhythm: Normal sinus rhythm (07/29 0236) Resp:  [13-23] 16 (07/29 0948) BP: (120-179)/(65-134) 146/81 (07/29 0948) SpO2:  [93 %-100 %] 98 % (07/29 0948) Weight:  [79.4 kg (175 lb 1.6 oz)-80.7 kg (178 lb)] 79.4 kg (175 lb 1.6 oz) (07/28 2100)  CBC:  Recent Labs Lab 07/23/16 1512 07/23/16 1516  WBC  --  9.9  NEUTROABS  --  4.8  HGB 15.3* 12.6  HCT 45.0 40.3  MCV  --  68.8*  PLT  --  [id number]    Basic Metabolic Panel:  Recent Labs Lab 07/23/16 1512 07/23/16 1516  NA 133* 130*  K 4.0 4.0  CL 97* 96*  CO2  --  22  GLUCOSE 588* 559*  BUN 4* 7  CREATININE 0.70 0.94  CALCIUM  --  9.7    Lipid Panel:    Component Value Date/Time   CHOL 265 (H) 07/24/2016 0420   TRIG 357 (H) 07/24/2016 0420   HDL 30 (L) 07/24/2016 0420   CHOLHDL 8.8 07/24/2016 0420   VLDL 71 (H) 07/24/2016 0420   LDLCALC 164 (H) 07/24/2016 0420   HgbA1c:   Lab Results  Component Value Date   HGBA1C 13.4 05/04/2016   Urine Drug Screen:    Component Value Date/Time   LABOPIA NEG 03/28/2012 0955   COCAINSCRNUR NEG 03/28/2012 0955   LABBENZ NEG 03/28/2012 0955   LABBENZ NEG 06/08/2010 2150   AMPHETMU NEG 03/28/2012 0955   THCU NEG 03/28/2012 0955   LABBARB NEG 03/28/2012 0955      IMAGING  Mr Susan Holmes Head/brain Wo Cm 07/24/2016  MRI HEAD  1. Acute/early subacute ischemic infarct involving the right external capsule/posterior right lentiform nucleus, extending into the right corona radiata. Associated faint petechial hemorrhage without significant mass effect.  2. No other acute intracranial process identified.   MRA HEAD IMPRESSION:  1. No large or proximal arterial branch occlusion within the intracranial circulation.  2. Focal moderate stenosis within the distal right M1 segment. Right MCA branches mildly distally as compared to the left.  3. No other obvious high-grade or correctable stenosis identified on this motion degraded exam.   Ct Head Code Stroke W/o Cm 07/23/2016 Area of evolving ischemia on the right as described.    PHYSICAL EXAM HEENT-  Normocephalic, no lesions, without obvious abnormality.  Normal external eye and conjunctiva. Neck supple with no masses, nodes, nodules or enlargement. Cardiovascular - regular  rate and rhythm, S1, S2 normal, no murmur, click, rub or gallop Lungs - chest clear, no wheezing, rales, normal symmetric air entry Abdomen - soft, non-tender; bowel sounds normal; no masses,  no organomegaly Extremities - no joint deformities, effusion, or inflammation and no edema  Neurologic Examination: Mental Status: Alert, oriented, thought content appropriate.  Speech fluent without evidence of aphasia. Able to follow commands without difficulty. Cranial Nerves: II-Visual fields were normal. III/IV/VI-Pupils were equal and reacted normally to light. Extraocular movements were full and conjugate.     V/VII-no facial numbness and no facial weakness. VIII-normal. X-normal speech and symmetrical palatal movement. XI: trapezius strength/neck flexion strength normal bilaterally XII-midline tongue extension with normal strength. Motor: 5/5 right arm; 4+/5 in left arm; legs more symmetric; normal tone and bulk Sensory: Normal throughout to LT Cerebellar: Normal finger-to-nose testing.  ASSESSMENT/PLAN [name] is a 60 y.o. female with history of hypertension, diabetes mellitus, hyperlipidemia, congenital blindness, and ongoing tobacco use presenting with left-sided weakness and slurred speech. She did not receive IV t-PA due to improvement in deficits.  Stroke:  Non-dominant infarct secondary to small vessel disease.  Resultant  Left sided weakness  MRI - Acute/early subacute ischemic infarct involving the right external capsule/posterior right lentiform nucleus, extending into the right corona radiata.  MRA  unremarkable  Carotid Doppler - pending  2D Echo pending  LDL - 164  HgbA1c pending  VTE prophylaxis - Lovenox  Diet heart healthy/carb modified Room service appropriate? Yes; Fluid consistency: Thin  aspirin 81 mg daily prior to admission, now on aspirin 81 mg daily  Patient counseled to be compliant with her antithrombotic medications  Ongoing aggressive stroke risk factor management  Therapy recommendations: Pending  Disposition: Pending  Hypertension  Stable  Permissive hypertension (OK if < 220/120) but gradually normalize in 5-7 days  Long-term BP goal normotensive  Hyperlipidemia  Home meds:  No lipid lowering medications prior to admission.  LDL 164, goal < 70  Now on Lipitor 80 mg daily.  Continue statin at discharge  Diabetes  HgbA1c pending, goal < 7.0  Uncontrolled  Other Stroke Risk Factors  Advanced age  Cigarette smoker - advised to stop smoking  Family hx stroke (sister and grandparents)  Other Active  Problems  Hyponatremia - 130  Glucose > 500 on admission  Hospital day # 1  ATTENDING NOTE: Patient was seen and examined by me personally. Documentation reflects findings. The laboratory and radiographic studies reviewed by me. ROS completed by me personally and pertinent positives fully documented Condition:  Stable  Assessment and plan completed by me personally and fully documented above. Plans/Recommendations include:     Stroke work up ongoing  Hyponatremia:  Primary team managing  Glucose markedly elevated:  Primary team managing   Lipid markedly elevated.  Agree with Lipitor.  Recommend nutrition counseling for lipids and glucose  MCV low; H/H within acceptable  SIGNED BY: Dr. Elissa Hefty      To contact Stroke Continuity provider, please refer to [email or web address] After hours, contact General Neurology

## 2016-07-24 NOTE — Evaluation (Addendum)
Physical Therapy Evaluation Patient Details Name: Susan Holmes MRN: [id number] DOB: Dec 27, 1956 Today's Date: 07/24/2016   History of Present Illness  60 y.o. female with medical history significant of HTN, HLD, DM, and tobacco abuse presenting with acute vs. subacute CVA.  Pt with reports of unstable balance and weakness. MRI on 7/29 + Acute/early subacute ischemic infarct involving the right external capsule/posterior right lentiform nucleus, extending into the right corona radiata. Associated faint petechial hemorrhage without significant mass effect.   Clinical Impression  Patient demonstrates deficits in functional mobility as indicated below. Will need continued skilled PT to address deficits and maixmize function. Will see as indicated and progress as tolerated. PATIENT WILL NEED 24/7 Supervision due to safety and fall risk, if unable to obtain may need to conisder ST post acute rehabilitation.    Follow Up Recommendations Home health PT;Supervision/Assistance - 24 hour (if 24.7 not provided WILL NEED post acute rehab)    Equipment Recommendations   (TBD )    Recommendations for Other Services Speech consult (cognitive evaluation)     Precautions / Restrictions Precautions Precautions: Fall Restrictions Weight Bearing Restrictions: No      Mobility  Bed Mobility Overal bed mobility: Needs Assistance Bed Mobility: Supine to Sit;Sit to Supine     Supine to sit: Supervision Sit to supine: Supervision   General bed mobility comments: Supervision for safety.  Transfers Overall transfer level: Needs assistance Equipment used: None Transfers: Sit to/from Stand Sit to Stand: Min assist         General transfer comment: Min assist for balance in standing.  Ambulation/Gait Ambulation/Gait assistance: Min assist Ambulation Distance (Feet): 30 Feet (limited due to transport arrival) Assistive device: 1 person hand held assist Gait Pattern/deviations:  Shuffle;Step-through pattern;Decreased step length - right;Drifts right/left;Narrow base of support Gait velocity: decreased Gait velocity interpretation: Below normal speed for age/gender General Gait Details: patient with some instability during ambulation, RLE lag and decreased coordination noted. poor receptivity to deficits (limited assessment due to transport arrival)  Financial trader Rankin (Stroke Patients Only) Modified Rankin (Stroke Patients Only) Pre-Morbid Rankin Score: No symptoms Modified Rankin: Moderately severe disability     Balance Overall balance assessment: Needs assistance Sitting-balance support: No upper extremity supported;Feet supported Sitting balance-Leahy Scale: Good     Standing balance support: No upper extremity supported;During functional activity Standing balance-Leahy Scale: Fair                               Pertinent Vitals/Pain Pain Assessment: No/denies pain    Home Living Family/patient expects to be discharged to:: Private residence Living Arrangements: Alone Available Help at Discharge: Friend(s);Available PRN/intermittently Type of Home: Apartment Home Access: Level entry     Home Layout: Two level;Bed/bath upstairs Home Equipment: Cane - single point      Prior Function Level of Independence: Independent               Hand Dominance   Dominant Hand: Right    Extremity/Trunk Assessment   Upper Extremity Assessment: Generalized weakness;RUE deficits/detail;LUE deficits/detail     RUE Sensation: decreased light touch     Lower Extremity Assessment: RLE deficits/detail RLE Deficits / Details: modest weakness noted RLE    Cervical / Trunk Assessment: Normal  Communication   Communication: No difficulties  Cognition Arousal/Alertness: Awake/alert Behavior During Therapy: Flat affect Overall Cognitive Status:  Impaired/Different from baseline Area of  Impairment: Safety/judgement         Safety/Judgement: Decreased awareness of safety;Decreased awareness of deficits          General Comments      Exercises        Assessment/Plan    PT Assessment    PT Diagnosis Difficulty walking;Abnormality of gait;Altered mental status   PT Problem List    PT Treatment Interventions     PT Goals (Current goals can be found in the Care Plan section) Acute Rehab PT Goals Patient Stated Goal: return home PT Goal Formulation: With patient Time For Goal Achievement: 08/07/16 Potential to Achieve Goals: Good    Frequency     Barriers to discharge        Co-evaluation               End of Session Equipment Utilized During Treatment: Gait belt Activity Tolerance: Patient tolerated treatment well Patient left: in bed;with call bell/phone within reach;Other (comment) (with transport) Nurse Communication: Mobility status (discharge needs)         Time: [id number] PT Time Calculation (min) (ACUTE ONLY): 12 min   Charges:   PT Evaluation $PT Eval Moderate Complexity: 1 Procedure     PT G CodesDuncan Dull August 19, 2016, 1:47 PM Alben Deeds, Tool DPT  [phone]

## 2016-07-24 NOTE — Progress Notes (Signed)
  Echocardiogram 2D Echocardiogram has been performed.  Susan Holmes 07/24/2016, 12:37 PM

## 2016-07-24 NOTE — Progress Notes (Signed)
Internal Medicine Attending  Date: 07/24/2016  Patient name: Susan Holmes Medical record number: [id number] Date of birth: [birth year] Age: 60 y.o. Gender: female  I saw and evaluated the patient. I reviewed the resident's note by Dr. Reesa Chew and I agree with the resident's findings and plans as documented in her progress note.  When seen on rounds this evening Susan Holmes was without complaints. Her residual deficits are minimal. Examination reveals a slight droop on the left corner of the mouth with smiling. Otherwise she was intact. She is not interested in going to a skilled nursing facility. She wants to return home, but is asking for home health services. We will ask case management to assess her eligibility for such services. We are completing the stroke workup at this time. She will require lifelong aspirin 81 mg daily. She has been placed on a high intensity statin. At discharge we will restart her antihypertensive regimen. I anticipate she will be ready for discharge home tomorrow. Follow-up will be in the [facility].

## 2016-07-24 NOTE — Progress Notes (Signed)
Pt sitting up in a chair eating breakfast. No signs of hypo/hyperglycemia. Pt denies pain or discomfort. Safety measures in place. Call bell within reach.

## 2016-07-24 NOTE — Evaluation (Signed)
Occupational Therapy Evaluation Patient Details Name: Susan Holmes MRN: [id number] DOB: [birth year] Today's Date: 07/24/2016    History of Present Illness 60 y.o. female with medical history significant of HTN, HLD, DM, and tobacco abuse presenting with acute vs. subacute CVA.  Patient reports that when she tried to get up this morning her legs felt very off balance, was weak. MRI on 7/29 + Acute/early subacute ischemic infarct involving the right external capsule/posterior right lentiform nucleus, extending into the right corona radiata. Associated faint petechial hemorrhage without significant mass effect.    Clinical Impression   Pt reports she was independent with ADL PTA. Currently pt is overall min assist for ADL and functional mobility. Pt presenting with generalized weakness in bil UEs, decreased sensation in proximal RUE, decreased safety awareness and insight into deficits. Highly recommend 24/7 supervision upon return home with HHOT to maximize independence and safety with ADL and functional mobility, if family is unable to provide 24/7 supervision would recommend SNF based on pts current level of function. Pt would benefit from continued skilled OT to address established goals.    Follow Up Recommendations  Home health OT;Supervision/Assistance - 24 hour (If does not have 24/7 S, will need SNF)    Equipment Recommendations  Other (comment) (tub seat vs bench)    Recommendations for Other Services PT consult     Precautions / Restrictions Precautions Precautions: Fall Restrictions Weight Bearing Restrictions: No      Mobility Bed Mobility Overal bed mobility: Needs Assistance Bed Mobility: Supine to Sit;Sit to Supine     Supine to sit: Supervision Sit to supine: Supervision   General bed mobility comments: Supervision for safety.  Transfers Overall transfer level: Needs assistance Equipment used: None Transfers: Sit to/from Stand Sit to Stand: Min assist         General transfer comment: Min assist for balance in standing.    Balance Overall balance assessment: Needs assistance Sitting-balance support: No upper extremity supported;Feet supported Sitting balance-Leahy Scale: Good     Standing balance support: No upper extremity supported;During functional activity Standing balance-Leahy Scale: Fair                              ADL Overall ADL's : Needs assistance/impaired Eating/Feeding: Set up;Sitting   Grooming: Minimal assistance;Standing Grooming Details (indicate cue type and reason): Min assist for balance in standing. Upper Body Bathing: Set up;Supervision/ safety;Sitting   Lower Body Bathing: Minimal assistance;Sit to/from stand   Upper Body Dressing : Set up;Supervision/safety;Sitting   Lower Body Dressing: Minimal assistance;Sit to/from stand   Toilet Transfer: Minimal assistance;Ambulation;Regular Toilet   Toileting- Clothing Manipulation and Hygiene: Minimal assistance;Sit to/from stand Toileting - Clothing Manipulation Details (indicate cue type and reason): Min assist for balance in standing     Functional mobility during ADLs: Minimal assistance       Vision Additional Comments: Further assessment needed   Perception     Praxis      Pertinent Vitals/Pain Pain Assessment: No/denies pain     Hand Dominance Right   Extremity/Trunk Assessment Upper Extremity Assessment Upper Extremity Assessment: Generalized weakness;RUE deficits/detail;LUE deficits/detail RUE Sensation: decreased light touch RUE Coordination: decreased fine motor LUE Coordination: decreased fine motor   Lower Extremity Assessment Lower Extremity Assessment: Defer to PT evaluation   Cervical / Trunk Assessment Cervical / Trunk Assessment: Normal   Communication Communication Communication: No difficulties   Cognition Arousal/Alertness: Awake/alert Behavior During Therapy: Flat affect Overall Cognitive  Status:  Impaired/Different from baseline Area of Impairment: Safety/judgement         Safety/Judgement: Decreased awareness of safety;Decreased awareness of deficits         General Comments       Exercises       Shoulder Instructions      Home Living Family/patient expects to be discharged to:: Private residence Living Arrangements: Alone Available Help at Discharge: Friend(s);Available PRN/intermittently Type of Home: Apartment Home Access: Level entry     Home Layout: Two level;Bed/bath upstairs Alternate Level Stairs-Number of Steps:  (has chair lift)   Bathroom Shower/Tub: Tub/shower unit Shower/tub characteristics: Architectural technologist: Standard     Home Equipment: Cane - single point          Prior Functioning/Environment Level of Independence: Independent             OT Diagnosis: Generalized weakness;Cognitive deficits   OT Problem List: Decreased strength;Impaired balance (sitting and/or standing);Decreased coordination;Decreased safety awareness;Decreased knowledge of use of DME or AE;Decreased knowledge of precautions;Impaired sensation   OT Treatment/Interventions: Self-care/ADL training;Energy conservation;DME and/or AE instruction;Therapeutic activities;Patient/family education;Balance training    OT Goals(Current goals can be found in the care plan section) Acute Rehab OT Goals Patient Stated Goal: return home OT Goal Formulation: With patient Time For Goal Achievement: 08/07/16 Potential to Achieve Goals: Good ADL Goals Pt Will Perform Grooming: with modified independence;standing Pt Will Perform Upper Body Bathing: with modified independence;sitting;standing Pt Will Perform Lower Body Bathing: with modified independence;sit to/from stand Pt Will Transfer to Toilet: with modified independence;ambulating;regular height toilet Pt Will Perform Toileting - Clothing Manipulation and hygiene: with modified independence;sit to/from stand Pt Will  Perform Tub/Shower Transfer: Tub transfer;with modified independence;ambulating;shower seat  OT Frequency: Min 2X/week   Barriers to D/C: Decreased caregiver support  lives alone       Co-evaluation              End of Session Equipment Utilized During Treatment: Gait belt Nurse Communication: Mobility status  Activity Tolerance: Patient tolerated treatment well Patient left: in bed;with call bell/phone within reach;Other (comment) (transport present)   Time: [id number] OT Time Calculation (min): 17 min Charges:  OT General Charges $OT Visit: 1 Procedure OT Evaluation $OT Eval Moderate Complexity: 1 Procedure G-Codes:     Binnie Kand M.S., OTR/L Pager: [phone]  07/24/2016, 1:03 PM

## 2016-07-24 NOTE — Progress Notes (Addendum)
*  PRELIMINARY RESULTS* Vascular Ultrasound Carotid Duplex (Doppler) has been completed.  Preliminary findings: Bilateral: No significant (1-39%) ICA stenosis. >50% left ECA stenosis. Antegrade vertebral flow.   Landry Mellow, RDMS, RVT  07/24/2016, 2:53 PM

## 2016-07-24 NOTE — Progress Notes (Signed)
Subjective: Feeling better when we saw her this morning, most of her symptoms were resolved except mild slurring of speech and blurry vision which has also improved some what. Denies any headaches,N/V.  Objective:  Vital signs in last 24 hours: Vitals:   07/24/16 0500 07/24/16 0800 07/24/16 0948 07/24/16 1337  BP: 121/74 (!) 151/93 (!) 146/81 (!) 145/100  Pulse: 79 84 75 85  Resp: 20 16 16 18   Temp: 98 F (36.7 C) 98.4 F (36.9 C) 97.9 F (36.6 C) 98.3 F (36.8 C)  TempSrc: Oral Oral Oral Oral  SpO2: 95% 100% 98% 96%  Weight:      Height:       Vitals:   07/24/16 0500 07/24/16 0800 07/24/16 0948 07/24/16 1337  BP: 121/74 (!) 151/93 (!) 146/81 (!) 145/100  Pulse: 79 84 75 85  Resp: 20 16 16 18   Temp: 98 F (36.7 C) 98.4 F (36.9 C) 97.9 F (36.6 C) 98.3 F (36.8 C)  TempSrc: Oral Oral Oral Oral  SpO2: 95% 100% 98% 96%  Weight:      Height:       General: Vital signs reviewed.  Patient is well-developed and well-nourished, in no acute distress and cooperative with exam.  Head: Normocephalic and atraumatic. Eyes: EOMI, conjunctivae normal, no scleral icterus.Blind left eye.Some blurriness in right.  Neck: Supple, trachea midline, normal ROM, no JVD, masses, thyromegaly, or carotid bruit present.  Cardiovascular: RRR, S1 normal, S2 normal, no murmurs, gallops, or rubs. Pulmonary/Chest: Clear to auscultation bilaterally, no wheezes, rales, or rhonchi. Abdominal: Soft, non-tender, non-distended, BS +, no masses, organomegaly, or guarding present.  Musculoskeletal: No joint deformities, erythema, or stiffness, ROM full except c/o rt. shoulder discomfort with full range of movements.. Extremities: No lower extremity edema bilaterally,  pulses symmetric and intact bilaterally. No cyanosis or clubbing. Neurological: A&O x3, Strength is normal and symmetric bilaterally, Mild residual right sided facial drop. cranial nerve II-XII are grossly intact, no focal motor deficit, sensory  intact to light touch bilaterally.  Skin: Warm, dry and intact. No rashes or erythema. Psychiatric: Normal mood with flat affect. speech is mildly slurred and behavior is normal. Cognition and memory are normal.   Labs: CBC Latest Ref Rng & Units 07/23/2016 07/23/2016 06/27/2016  WBC 4.0 - 10.5 K/uL 9.9 - 7.8  Hemoglobin 12.0 - 15.0 g/dL 12.6 15.3(H) 11.4(L)  Hematocrit 36.0 - 46.0 % 40.3 45.0 36.1  Platelets 150 - 400 K/uL 247 - 244   CMP Latest Ref Rng & Units 07/23/2016 07/23/2016 06/27/2016  Glucose 65 - 99 mg/dL 559(HH) 588(HH) 320(H)  BUN 6 - 20 mg/dL 7 4(L) <5(L)  Creatinine 0.44 - 1.00 mg/dL 0.94 0.70 0.75  Sodium 135 - 145 mmol/L 130(L) 133(L) 135  Potassium 3.5 - 5.1 mmol/L 4.0 4.0 3.9  Chloride 101 - 111 mmol/L 96(L) 97(L) 103  CO2 22 - 32 mmol/L 22 - 24  Calcium 8.9 - 10.3 mg/dL 9.7 - 9.2  Total Protein 6.5 - 8.1 g/dL 7.8 - -  Total Bilirubin 0.3 - 1.2 mg/dL 0.6 - -  Alkaline Phos 38 - 126 U/L 107 - -  AST 15 - 41 U/L 22 - -  ALT 14 - 54 U/L 27 - -   Lipid Panel     Component Value Date/Time   CHOL 265 (H) 07/24/2016 0420   TRIG 357 (H) 07/24/2016 0420   HDL 30 (L) 07/24/2016 0420   CHOLHDL 8.8 07/24/2016 0420   VLDL 71 (H) 07/24/2016 0420  LDLCALC 164 (H) 07/24/2016 0420   LDLDIRECT 183 (H) 03/01/2014 0500   CBG: 555  445  381  407  292 CT Head Code Stroke W/O CM : COMPARISON:  02/10/2014 FINDINGS: The bony calvarium is intact. No gross soft tissue abnormality is noted. There is 1 cm area of decreased attenuation identified on the right in the area of the extreme capsule and right basal ganglia posteriorly. This was not present on a prior exam and is consistent with an area of evolving ischemia. This likely is acute to subacute in nature. No findings to suggest acute hemorrhage or space-occupying mass lesion are noted. No other areas of ischemia are seen. IMPRESSION: Area of evolving ischemia on the right as described. Critical Value/emergent results were called by  telephone at the time of interpretation on 07/23/2016 at 3:39 pm to Dr. Tanna Furry , who verbally acknowledged these results. Electronically Signed   By: Inez Catalina M.D.   On: 07/23/2016 15:40  MR Brain Wo Contrast and MRA: IMPRESSION: MRI HEAD IMPRESSION: 1. Acute/early subacute ischemic infarct involving the right external capsule/posterior right lentiform nucleus, extending into the right corona radiata. Associated faint petechial hemorrhage without significant mass effect. 2. No other acute intracranial process identified. MRA HEAD IMPRESSION: 1. No large or proximal arterial branch occlusion within the intracranial circulation. 2. Focal moderate stenosis within the distal right M1 segment. Right MCA branches mildly distally as compared to the left. 3. No other obvious high-grade or correctable stenosis identified on this motion degraded exam. Electronically Signed   By: Jeannine Boga M.D.   On: 07/24/2016 02:5  Assessment/Plan:  Batcho is an 60 y.o. female with a history of hypertension, diabetes mellitus, hyperlipidemia and tobacco abuse, presenting with new onset weakness of both lower limb,R sided facial drop as well as slurred speech. Patient woke up with deficits this morning. She was last known well at 10 PM last night. She has no history of stroke nor TIA. She has not been taking aspirin on a regular basis. CT scan of her head showed an area of hypodensity involving right basal ganglia and extreme capsule, indicative of  subacute ischemic infarction. Deficits have improved since admission.  CVA: Acute/subacute stoke.Her symptoms have improved since presentation. She do have some ischemic changes on imaging.  -Permissive HTN for 48 hrs. -ECHO -Carotid dopplers -Aspirin -All have been ordered by admitting team.  HTN: Her current BP is 145/100.She is on Losartan 50 mg BID. -Hold her home meds. For permissive hypertension   DM: She came with CBG of 555, was  not taking her insulin for 2 days as she ran out of her supply.She states that her sugar runs above 300-400 most of time at home.She is on Metformin 1000 mg BID,exenatide 47mcg BID, Insulin Glargine 40 units at bed time and humalog 8 units with lunch and 12 units with dinner. By talking with her, she was not sure about her exact dosage and use. -Continue her home regime with SS.except exenatide and metformin. -Need Diabetic education  -May be a simpler regime help.  HLD -Lipids checked 1 year ago and showed triglycerides 346, TC 231, good HDL, LDL 124 -Repeat also show hyperlipidemia.She was on on statins at home. -Start high-dose Lipitor 80 mg for now and taper as appropriate  Tobacco abuse -Encourage cessation.  This was discussed with the patient and should be reviewed on an ongoing basis.   -Patch ordered at patient request.  Depression -Patient is on a large number of mood-related  and sleep-inducing medications and antidepressant. Need a psychiatry help to reconcile them. - continue her home meds. Of Celexa 40mg ,doxepin 25 mg, Cymbalta 60mg , Remeron 30 mg at bed time,trazodone 300 mg bed time.    Dispo: Anticipated discharge in approximately 1 day(s).   LOS: 1 day   Lorella Nimrod, MD 07/24/2016, 2:10 PM Pager: [phone]

## 2016-07-24 NOTE — Evaluation (Signed)
Speech Language Pathology Evaluation Patient Details Name: Susan Holmes MRN: [id number] DOB: March 19, 1956 Today's Date: 07/24/2016 Time: 1005-1050 SLP Time Calculation (min) (ACUTE ONLY): 45 min  Problem List:  Patient Active Problem List   Diagnosis Date Noted  . CVA (cerebrovascular accident) ([place]) 07/23/2016  . Cough 07/08/2016  . Sweating abnormality 12/16/2015  . Neck pain, musculoskeletal 11/06/2015  . Vaginal itching 10/06/2015  . Lower back pain 08/06/2015  . At risk for polypharmacy 08/06/2015  . Right hip pain 07/04/2015  . Left hip pain 06/04/2015  . Constipation 04/21/2015  . Seasonal allergies 04/21/2015  . Insomnia 12/31/2014  . Osteoarthritis 12/11/2014  . Local skin infection 12/04/2014  . Bilateral arm pain 11/19/2014  . Blurry vision 11/19/2014  . Abnormal stress test   . Chest pain 10/07/2014  . Healthcare maintenance 10/07/2014  . Assistance with transportation 09/19/2014  . Bilateral leg pain 09/05/2014  . Liver mass 04/04/2014  . Anemia, unspecified 02/05/2014  . Unspecified vitamin D deficiency 03/11/2013  . Hyperparathyroidism ([place]) 03/08/2013  . Rotator cuff syndrome of right shoulder 02/26/2012  . Type II diabetes mellitus with neurological manifestations, uncontrolled ([place]) 06/12/2007  . Hyperlipidemia associated with type 2 diabetes mellitus ([place]) 06/12/2007  . ANXIETY 06/12/2007  . TOBACCO ABUSE 06/12/2007  . Depression 06/12/2007  . CONGENITAL BLINDNESS, LEFT EYE 06/12/2007  . Essential hypertension 06/12/2007   Past Medical History:  Past Medical History:  Diagnosis Date  . Abnormal stress test   . Anxiety   . Arthritis   . Congenital blindness    L eye  . Depression   . Diabetes mellitus, type II ([place])   . Glaucoma   . Hepatitis B infection pt unsure   resolved  . HTN (hypertension)   . Hyperlipidemia   . Tobacco abuse    .5 PPD smoker   Past Surgical History:  Past Surgical History:  Procedure Laterality Date  .  ABDOMINAL HYSTERECTOMY    . LEFT HEART CATHETERIZATION WITH CORONARY ANGIOGRAM N/A 10/09/2014   Procedure: LEFT HEART CATHETERIZATION WITH CORONARY ANGIOGRAM;  Surgeon: Troy Sine, MD;  Location: [facility] CATH LAB;  Service: Cardiovascular;  Laterality: N/A;   HPI:  60 y.o. female with medical history significant of HTN, HLD, DM, and tobacco abuse presenting with acute vs. subacute CVA.  Pt with reports of unstable balance and weakness. MRI on 7/29 + Acute/early subacute ischemic infarct involving the right external capsule/posterior right lentiform nucleus, extending into the right corona radiata. Associated faint petechial hemorrhage without significant mass effect.    Assessment / Plan / Recommendation Clinical Impression  Patient presents with a mild dysarthria and mild cognitive impairment. Dysarthria is flaccid, with reduced intelligibility at sentence and conversational levels, however this is mild and this SLP was able to understand her 90% of the time. Patient did exhibit a mild cognitive impairment, however SLP was not able to determine baseline, as no family members were present, and per patient's report, she was very tired from poor sleep, which may have also impacted her ability to efficiently respond to questions. Patient's main cognitive impairment is characterized by delays in initiation of responses to questions, poor alternating attention, and questionable safety awareness, especially with anticipatory awareness. Because of the fact that patient lives by herself, feel that she may benefit from short term home health speech-language therapy to assess her higher level problem solving and funtional safety awareness in her home environment.    SLP Assessment   (Because of the mild nature of her impairments,  do not feel that she would benefit from acute-level speech-language therapy, however she will benefit from being assessed and possibly treated for safety and complex level problem solving  at next venue)    Follow Up Recommendations  Home health SLP    Frequency and Duration   N/A        SLP Evaluation Prior Functioning  Cognitive/Linguistic Baseline: Information not available Type of Home: Apartment Available Help at Discharge: Friend(s);Available PRN/intermittently   Cognition  Overall Cognitive Status: No family/caregiver present to determine baseline cognitive functioning Arousal/Alertness:  (seemed a little tired) Orientation Level: Oriented X4 Attention: Selective;Alternating Sustained Attention: Appears intact Selective Attention: Appears intact Alternating Attention: Impaired Alternating Attention Impairment: Verbal complex;Functional basic Memory: Appears intact Awareness: Impaired Awareness Impairment: Anticipatory impairment Problem Solving: Impaired Problem Solving Impairment: Verbal complex Executive Function: Initiating Initiating: Impaired Initiating Impairment: Verbal complex Safety/Judgment: Appears intact    Comprehension  Auditory Comprehension Overall Auditory Comprehension: Appears within functional limits for tasks assessed    Expression Expression Primary Mode of Expression: Verbal Verbal Expression Overall Verbal Expression: Appears within functional limits for tasks assessed   Oral / Motor  Oral Motor/Sensory Function Overall Oral Motor/Sensory Function: Mild impairment Facial ROM: Within Functional Limits Facial Symmetry: Within Functional Limits Facial Strength: Within Functional Limits Facial Sensation: Within Functional Limits Lingual ROM: Within Functional Limits Lingual Symmetry: Within Functional Limits Lingual Strength: Reduced Velum: Within Functional Limits Mandible: Within Functional Limits Motor Speech Overall Motor Speech: Impaired Respiration: Within functional limits Phonation: Normal Resonance: Within functional limits Articulation: Impaired Level of Impairment: Sentence Intelligibility:  Intelligibility reduced Word: 75-100% accurate Phrase: 50-74% accurate Sentence: 50-74% accurate Conversation: 50-74% accurate Motor Planning: Witnin functional limits    Sonia Baller, MA, CCC-SLP 07/24/16 4:58 PM

## 2016-07-24 NOTE — Progress Notes (Addendum)
   Transfer Note  Ms Stclair was transferred to the IMTS from [facility] due to [facility] PCP.  Please see Dr Karmen Bongo' note from 7/28 for full admission H&P.  In brief, she is a 60 year old woman with history of HTN, HL, DM, and tobacco abuse admitted for CVA outside tPA window.  She awoke on the morning of admission had dysarthria, aphasia, L sided facial droop, diplopia, and bilateral LE weakness, all of which are now much improved.  Head CT shows ischemia in R basal ganglia and extreme capsule.   Objective:  Vital signs in last 24 hours: Vitals:   07/23/16 2000 07/23/16 2015 07/23/16 2100 07/23/16 2300  BP: (!) 161/111 (!) 179/109 (!) 167/103 120/68  Pulse: 69 68 64 64  Resp: 22 20 20 20   Temp:   97.9 F (36.6 C) 98.1 F (36.7 C)  TempSrc:   Oral Oral  SpO2: 93% 95% 98% 95%  Weight:   175 lb 1.6 oz (79.4 kg)   Height:   5\' 7"  (1.702 m)    Physical Exam  Constitutional: She is oriented to person, place, and time. She appears well-developed and well-nourished. No distress.  HENT:  Head: Normocephalic and atraumatic.  Eyes: Conjunctivae and EOM are normal. Pupils are equal, round, and reactive to light.  Neck: Normal range of motion. Neck supple.  Cardiovascular: Normal rate and regular rhythm.   No carotid bruit  Pulmonary/Chest: Effort normal and breath sounds normal. No respiratory distress. She has no wheezes.  Neurological: She is alert and oriented to person, place, and time. No cranial nerve deficit. Coordination normal.  Strength 5/5 symmetric in UE and LE  Psychiatric: She has a normal mood and affect. Her behavior is normal.     Assessment/Plan:  Principal Problem:   CVA (cerebrovascular accident) ([place]) Active Problems:   Type II diabetes mellitus with neurological manifestations, uncontrolled ([place])   Hyperlipidemia associated with type 2 diabetes mellitus ([place])   TOBACCO ABUSE   Depression   CONGENITAL BLINDNESS, LEFT EYE   Essential  hypertension  #Stroke Appreciate neurology recommendations.  Assess and optimize modifiable risk factors. -MRI/MRA brain (requests sedation) -Carotid doppler -Echo -Tele -Aspirin -Statin -A1c, Lipids -PT/OT/Speech -Tobacco cessation -Concern for depression; reassess mood/affect in AM  #HTN -Permissive HTN -Hold home meds  #DM -Lantus and SSI -Hold home exenatide and metformin  Dispo: Anticipated discharge in approximately 1 day(s).   LOS: 1 day   Minus Liberty, MD 07/24/2016, 12:28 AM Pager: [phone]

## 2016-07-25 LAB — GLUCOSE, CAPILLARY
Glucose-Capillary: 402 mg/dL — ABNORMAL HIGH (ref 65–99)
Glucose-Capillary: 337 mg/dL — ABNORMAL HIGH (ref 65–99)

## 2016-07-25 LAB — GLUCOSE, RANDOM: Glucose, Bld: 365 mg/dL — ABNORMAL HIGH (ref 65–99)

## 2016-07-25 MED ORDER — INSULIN GLARGINE 100 UNIT/ML SOLOSTAR PEN: 45.0000 [IU] | PEN_INJECTOR | Freq: Every day | SUBCUTANEOUS | 3 refills | Status: DC

## 2016-07-25 MED ORDER — INSULIN GLARGINE 100 UNIT/ML ~~LOC~~ SOLN
45.0000 [IU] | Freq: Every day | SUBCUTANEOUS | Status: DC
  Filled 2016-07-25: qty 0.45

## 2016-07-25 MED ORDER — INSULIN LISPRO 100 UNIT/ML (KWIKPEN): PEN_INJECTOR | SUBCUTANEOUS | 3 refills | Status: DC

## 2016-07-25 MED ORDER — ATORVASTATIN CALCIUM 80 MG PO TABS: 80.0000 mg | ORAL_TABLET | Freq: Every day | ORAL | 2 refills | Status: DC

## 2016-07-25 MED ORDER — LOSARTAN POTASSIUM 50 MG PO TABS
50.0000 mg | ORAL_TABLET | Freq: Two times a day (BID) | ORAL | Status: DC
  Administered 2016-07-25: 50 mg via ORAL
  Filled 2016-07-25: qty 1

## 2016-07-25 NOTE — Progress Notes (Signed)
Patient is being d/c home. D/c instructions given and patient verbalized understanding. 

## 2016-07-25 NOTE — Progress Notes (Signed)
Internal Medicine Attending  Date: 07/25/2016  Patient name: Susan Holmes Medical record number: [id number] Date of birth: May 29, 1956 Age: 60 y.o. Gender: female  I saw and evaluated the patient. I reviewed the resident's note by Dr. Reesa Chew and I agree with the resident's findings and plans as documented in her progress note.  When seen on rounds this morning Susan Holmes was without acute complaints. Physical exam revealed complete resolution of her very mild left facial droop seen yesterday. She's not interested in skilled nursing facility placement and would like to return home. I feel she is safe for discharge home and we will see if home health physical therapy may be available to work with her there.

## 2016-07-25 NOTE — Progress Notes (Signed)
Subjective: Feeling better this morning,her stroke symptoms has almost resolved.Wants to go home with some home health care help.  Objective:  Vital signs in last 24 hours: Vitals:   07/24/16 1737 07/24/16 2131 07/25/16 0100 07/25/16 0501  BP: (!) 150/85 (!) 177/104 119/76 (!) 159/93  Pulse: 96 87 87 87  Resp: 18 18 18 18   Temp: 98.1 F (36.7 C) 99.1 F (37.3 C) 97.5 F (36.4 C) 98.2 F (36.8 C)  TempSrc: Oral Oral Axillary Oral  SpO2: 97% 97% 100% 100%  Weight:      Height:       General: Vital signs reviewed.  Patient is well-developed and well-nourished, in no acute distress and cooperative with exam.  Head: Normocephalic and atraumatic. Eyes: EOMI, conjunctivae normal, no scleral icterus.Blind left eye. Neck: Supple, trachea midline, normal ROM, no JVD, masses, thyromegaly, or carotid bruit present.  Cardiovascular: RRR, S1 normal, S2 normal, no murmurs, gallops, or rubs. Pulmonary/Chest: Clear to auscultation bilaterally, no wheezes, rales, or rhonchi. Abdominal: Soft, non-tender, non-distended, BS +, no masses, organomegaly, or guarding present.  Musculoskeletal: No joint deformities, erythema, or stiffness, ROM full except c/o rt. shoulder discomfort with full range of movements.. Extremities: No lower extremity edema bilaterally,  pulses symmetric and intact bilaterally. No cyanosis or clubbing. Neurological: A&O x3, Strength is normal and symmetric bilaterally,V Mild residual right sided facial drop, improved then yesterday. cranial nerve II-XII are grossly intact, no focal motor deficit,  Skin: Warm, dry and intact. No rashes or erythema. Psychiatric: Normal mood with flat affect. speech looks normal today, and behavior is normal. Cognition and memory are normal.   Labs: CBG: 402  339  277  292   ECHO: ------------------------------------------------------------------- LV EF: 55% -    60%  ------------------------------------------------------------------- Indications:      CVA 436.  ------------------------------------------------------------------- History:   Risk factors:  Current tobacco use. Hypertension. Diabetes mellitus. Dyslipidemia.  ------------------------------------------------------------------- Study Conclusions  - Left ventricle: The cavity size was normal. Systolic function was normal. The estimated ejection fraction was in the range of 55%  to 60%. Wall motion was normal; there were no regional wall motion abnormalities. Doppler parameters are consistent with  restrictive physiology, indicative of decreased left ventricular diastolic compliance and/or increased left atrial pressure.  US Carotids : Vascular Ultrasound Carotid Duplex (Doppler) has been completed.  Preliminary findings: Bilateral: No significant (1-39%) ICA stenosis. >50% left ECA stenosis. Antegrade vertebral flow.   Susan Holmes, RDMS, RVT  07/24/2016, 2:53 PM   Assessment/Plan: Huntleyis an 60 y.o.femalewith a history of hypertension, diabetes mellitus, hyperlipidemia and tobacco abuse, presenting with new onset weakness of both lower limb,R sided facial drop as well as slurred speech.   CVA: Acute/subacute stoke.Her symptoms have improved since presentation. She do have some ischemic changes on imaging.  --ECHO is not showing any embolic source. -Carotid dopplers No significant ICA stenosis,  >50% left ECA stenosis -Aspirin -Lipitor  HTN: Her current BP is 159/93 .She is on Losartan 50 mg BID. -Restart her home dose of Losartan as she is out of 48 hrs. Window  for permissive hypertension   DM: She came with CBG of 555, was not taking her insulin for 2 days as she ran out of her supply.She states that her sugar runs above 300-400 most of time at home.She is on Metformin 1000 mg BID,exenatide 82mcg BID, Insulin Glargine 40 units at bed time and humalog 8 units with  lunch and 12 units with dinner. By talking with her, she was not  sure about her exact dosage and use. Her CBG is running high during her stay.[phone]) Increase her lantus to 45 units and add 5 units of Novolog with each meal and restart her home  exenatide and metformin. -Need Diabetic education  - Close out patient monitoring to reach the goal.  -HLD -Lipids checked 1 year ago and showed triglycerides 346, TC 231, good HDL, LDL 124 -Repeat also show hyperlipidemia.She was on on statins at home. -Start high-dose Lipitor 80 mg for now and taper as appropriate  Tobacco abuse -Encourage cessation. This was discussed with the patient and should be reviewed on an ongoing basis.  -Patch ordered at patient request.  Depression -Patient is on a large number of mood-related and sleep-inducing medications and antidepressant. She is being followed up at [facility] .- continue her home meds. Of Celexa 40mg ,doxepin 25 mg, Cymbalta 60mg , Remeron 30 mg at bed time,trazodone 300 mg bed time.   Dispo: Discharge today.   LOS: 2 days   Susan Nimrod, MD 07/25/2016, 9:32 AM Pager: [phone]

## 2016-07-25 NOTE — Progress Notes (Signed)
STROKE TEAM PROGRESS NOTE   HISTORY OF PRESENT ILLNESS (per record) Susan Holmes is an 60 y.o. female with a history of hypertension, diabetes mellitus, hyperlipidemia and tobacco abuse, presenting with new onset weakness involving left side as well as slurred speech. Patient woke up with deficits this morning. She was last known well at 10 PM last night. She has no history of stroke nor TIA. She has not been taking aspirin on a regular basis. CT scan of her head showed an area of hypodensity involving right basal ganglia and extreme capsule, indicative of you to subacute ischemic infarction. Deficits have improved since admission. NIH stroke score the time of this evaluation was 0  LSN: 10 PM on 07/22/2016 tPA Given: No: Deficits improved mRankin:   SUBJECTIVE (INTERVAL HISTORY) Her family was not at the bedside.  Overall she feels her condition is better. Full ROS was negative.  She is looking forward to going home  OBJECTIVE Temp:  [97.5 F (36.4 C)-99.1 F (37.3 C)] 98.4 F (36.9 C) (07/30 1009) Pulse Rate:  [79-97] 79 (07/30 1009) Cardiac Rhythm: Normal sinus rhythm (07/29 1900) Resp:  [18-20] 20 (07/30 1009) BP: (119-177)/(76-104) 163/90 (07/30 1009) SpO2:  [97 %-100 %] 99 % (07/30 1009)  CBC:   Recent Labs Lab 07/23/16 1512 07/23/16 1516  WBC  --  9.9  NEUTROABS  --  4.8  HGB 15.3* 12.6  HCT 45.0 40.3  MCV  --  68.8*  PLT  --  [id number]    Basic Metabolic Panel:   Recent Labs Lab 07/23/16 1512 07/23/16 1516 07/25/16 0706  NA 133* 130*  --   K 4.0 4.0  --   CL 97* 96*  --   CO2  --  22  --   GLUCOSE 588* 559* 365*  BUN 4* 7  --   CREATININE 0.70 0.94  --   CALCIUM  --  9.7  --     Lipid Panel:     Component Value Date/Time   CHOL 265 (H) 07/24/2016 0420   TRIG 357 (H) 07/24/2016 0420   HDL 30 (L) 07/24/2016 0420   CHOLHDL 8.8 07/24/2016 0420   VLDL 71 (H) 07/24/2016 0420   LDLCALC 164 (H) 07/24/2016 0420   HgbA1c:  Lab Results  Component Value  Date   HGBA1C 13.4 05/04/2016   Urine Drug Screen:     Component Value Date/Time   LABOPIA NEG 03/28/2012 0955   COCAINSCRNUR NEG 03/28/2012 0955   LABBENZ NEG 03/28/2012 0955   LABBENZ NEG 06/08/2010 2150   AMPHETMU NEG 03/28/2012 0955   THCU NEG 03/28/2012 0955   LABBARB NEG 03/28/2012 0955      IMAGING  Mr Jodene Nam Head/brain Wo Cm 07/24/2016  MRI HEAD  1. Acute/early subacute ischemic infarct involving the right external capsule/posterior right lentiform nucleus, extending into the right corona radiata. Associated faint petechial hemorrhage without significant mass effect.  2. No other acute intracranial process identified.   MRA HEAD IMPRESSION:  1. No large or proximal arterial branch occlusion within the intracranial circulation.  2. Focal moderate stenosis within the distal right M1 segment. Right MCA branches mildly distally as compared to the left.  3. No other obvious high-grade or correctable stenosis identified on this motion degraded exam.   Ct Head Code Stroke W/o Cm 07/23/2016 Area of evolving ischemia on the right as described.    PHYSICAL EXAM HEENT-  Normocephalic, no lesions, without obvious abnormality.  Normal external eye and conjunctiva. Neck supple with  no masses, nodes, nodules or enlargement. Cardiovascular - regular rate and rhythm, S1, S2 normal, no murmur, click, rub or gallop Lungs - chest clear, no wheezing, rales, normal symmetric air entry Abdomen - soft, non-tender; bowel sounds normal; no masses,  no organomegaly Extremities - no joint deformities, effusion, or inflammation and no edema  Neurologic Examination: Mental Status: Alert, oriented, thought content appropriate.  Speech fluent without evidence of aphasia. Able to follow commands without difficulty. Cranial Nerves: II-Visual fields were normal. III/IV/VI-Pupils were equal and reacted normally to light. Extraocular movements were full and conjugate.    V/VII-no facial numbness and  no facial weakness. VIII-normal. X-normal speech and symmetrical palatal movement. XI: trapezius strength/neck flexion strength normal bilaterally XII-midline tongue extension with normal strength. Motor: 5/5 right arm; 4+/5 in left arm; legs more symmetric; normal tone and bulk Sensory: Normal throughout to LT Cerebellar: Normal finger-to-nose testing.  ASSESSMENT/PLAN [name] is a 60 y.o. female with history of hypertension, diabetes mellitus, hyperlipidemia, congenital blindness, and ongoing tobacco use presenting with left-sided weakness and slurred speech. She did not receive IV t-PA due to improvement in deficits.  Stroke:  Non-dominant infarct secondary to small vessel disease.  Resultant  Left sided weakness  MRI - Acute/early subacute ischemic infarct involving the right external capsule/posterior right lentiform nucleus, extending into the right corona radiata.  MRA  unremarkable  Carotid Doppler - Bilateral: No significant (1-39%) ICA stenosis. >50% left ECA stenosis. Antegrade vertebral flow.   2D Echo - EF 55-60%. No cardiac source of emboli identified.  LDL - 164  HgbA1c pending  VTE prophylaxis - Lovenox Diet heart healthy/carb modified Room service appropriate? Yes; Fluid consistency: Thin Diet - low sodium heart healthy  aspirin 81 mg daily prior to admission, now on aspirin 81 mg daily  Patient counseled to be compliant with her antithrombotic medications  Ongoing aggressive stroke risk factor management  Therapy recommendations: Home health PT and OT recommended.  Disposition: Pending  Hypertension  Stable  Permissive hypertension (OK if < 220/120) but gradually normalize in 5-7 days  Long-term BP goal normotensive  Hyperlipidemia  Home meds:  No lipid lowering medications prior to admission.  LDL 164, goal < 70  Now on Lipitor 80 mg daily.  Continue statin at discharge  Diabetes  HgbA1c pending, goal <  7.0  Uncontrolled  Other Stroke Risk Factors  Advanced age  Cigarette smoker - advised to stop smoking  Family hx stroke (sister and grandparents)  Other Active Problems  Hyponatremia - 130  Glucose > 500 on admission  PLAN  Follow-up Dr Leonie Man 2 months  Hospital day # 2  ATTENDING NOTE: Patient was seen and examined by me personally. Documentation reflects findings. The laboratory and radiographic studies reviewed by me. ROS completed by me personally and pertinent positives fully documented Condition:  Stable  Assessment and plan completed by me personally and fully documented above. Plans/Recommendations include:     Stroke work up complete except A1c to be followed up as outpatient  Hyponatremia:  Primary team managing  Glucose markedly elevated:  Primary team managing   Lipid markedly elevated.  Agree with Lipitor.  Recommend nutrition counseling for lipids and glucose  Patient to have have follow-up with Dr. Leonie Man in 2 months.  Recommend PCP visit within 7-10 of discharge to follow up on hyponatremia, blood sugars  SIGNED BY: Dr. Elissa Hefty      To contact Stroke Continuity provider, please refer to [email or web address] After hours, contact General Neurology

## 2016-07-25 NOTE — Progress Notes (Signed)
Physical Therapy Treatment Patient Details Name: Susan Holmes MRN: [id number] DOB: [birth year] Today's Date: 07/25/2016    History of Present Illness 60 y.o. female with medical history significant of HTN, HLD, DM, and tobacco abuse presenting with acute vs. subacute CVA.  Patient reports that when she tried to get up this morning her legs felt very off balance, was weak. MRI on 7/29 + Acute/early subacute ischemic infarct involving the right external capsule/posterior right lentiform nucleus, extending into the right corona radiata. Associated faint petechial hemorrhage without significant mass effect.     PT Comments    Pt with improved activity tolerance today with increased activity performed. Continues to need up to min assist at times for balance/safety. Continue to recommend 24 hour supervision at discharge with [facility]. SNF if 24 hour assistance not available as stated with PT evaluation. Acute PT to continue during pt's hospital stay.   Follow Up Recommendations  Home health PT;Supervision/Assistance - 24 hour (if 24 hour not provide, will need post acute rehab)     Equipment Recommendations  Other (comment) (TBD)    Recommendations for Other Services Speech consult     Precautions / Restrictions Precautions Precautions: Fall Restrictions Weight Bearing Restrictions: No    Mobility  Bed Mobility               General bed mobility comments: in recliner upon arrival and after session  Transfers Overall transfer level: Needs assistance Equipment used: None Transfers: Sit to/from Stand Sit to Stand: Min guard Stand pivot transfers: Min guard       General transfer comment: increased time needed with assistance needed to stabilize once standing  Ambulation/Gait Ambulation/Gait assistance: Min guard;Min assist Ambulation Distance (Feet): 150 Feet Assistive device: None Gait Pattern/deviations: Step-through pattern;Decreased stride length;Drifts right/left;Narrow  base of support;Shuffle Gait velocity: decreased Gait velocity interpretation: Below normal speed for age/gender General Gait Details: pt continues to demo decreased foot clearance/step length with gait, veering/drifting laterally which increases with enviromental scanning. min guard assist for safety up to min assist for balance at times with no AD.   Stairs Stairs: Yes Stairs assistance: Min assist Stair Management: One rail Right;Step to pattern;Forwards;Alternating pattern Number of Stairs: 12 General stair comments: pt intially attempting reciprocal pattern and needing up to min assist for balance. with cues for safety pt able to demo step to pattern with min guard assist for safety/balance. occasional incidence of right toes catching step edge with ascending stairs.      Modified Rankin (Stroke Patients Only) Modified Rankin (Stroke Patients Only) Pre-Morbid Rankin Score: No symptoms Modified Rankin: Moderately severe disability     Cognition Arousal/Alertness: Awake/alert Behavior During Therapy: WFL for tasks assessed/performed Overall Cognitive Status: No family/caregiver present to determine baseline cognitive functioning Area of Impairment: Safety/judgement;Problem solving         Safety/Judgement: Decreased awareness of safety;Decreased awareness of deficits     General Comments: no family in room to determine baseline cognitive status. Pt does demo poor insight and problem solving in today's session. Reports she will have her grand-daughter staying with her to provide the supervision needed for safety.     Pertinent Vitals/Pain Pain Assessment: No/denies pain     PT Goals (current goals can now be found in the care plan section) Acute Rehab PT Goals Patient Stated Goal: return home PT Goal Formulation: With patient Time For Goal Achievement: 08/07/16 Potential to Achieve Goals: Good Progress towards PT goals: Progressing toward goals    PT Plan Current  plan remains appropriate    End of Session Equipment Utilized During Treatment: Gait belt Activity Tolerance: Patient tolerated treatment well Patient left: in chair;with call bell/phone within reach;with chair alarm set     Time: 1124-1140 PT Time Calculation (min) (ACUTE ONLY): 16 min  Charges:  $Gait Training: 8-22 mins           Willow Ora 07/25/2016, 12:24 PM   Willow Ora, PTA, [phone] 07/25/16, 12:24 PM

## 2016-07-25 NOTE — Discharge Summary (Signed)
Name: Susan Holmes MRN: [id number] DOB: July 16, 1956 60 y.o. PCP: Shela Leff, MD  Date of Admission: 07/23/2016  2:42 PM Date of Discharge: 07/25/2016 Attending Physician: No att. providers found  Discharge Diagnosis: 1.CVA Principal Problem:   CVA (cerebrovascular accident) ([place]) Active Problems:   Type II diabetes mellitus with neurological manifestations, uncontrolled ([place])   Hyperlipidemia associated with type 2 diabetes mellitus ([place])   TOBACCO ABUSE   Depression   CONGENITAL BLINDNESS, LEFT EYE   Essential hypertension   Discharge Medications:   Medication List    TAKE these medications   ACCU-CHEK AVIVA PLUS w/Device Kit Use to check blood sugar 3 to 4 times daily. diag code 250.62. Insulin dependent   ACCU-CHEK FASTCLIX LANCETS Misc Check blood sugar 3x a day as instructed dx cod 250.00 insulin requiring   ACCU-CHEK SMARTVIEW CONTROL Liqd   Adjustable Lancing Device Misc 1 each.   amLODipine 5 MG tablet Commonly known as:  NORVASC TAKE 1 TABLET(5 MG) BY MOUTH DAILY   aspirin 81 MG EC tablet TAKE 1 TABLET BY MOUTH EVERY DAY What changed:  See the new instructions.   atorvastatin 80 MG tablet Commonly known as:  LIPITOR Take 1 tablet (80 mg total) by mouth daily at 6 PM.   citalopram 40 MG tablet Commonly known as:  CELEXA Take 40 mg by mouth daily.   doxepin 25 MG capsule Commonly known as:  SINEQUAN Take 25 mg by mouth at bedtime.   DULoxetine 60 MG capsule Commonly known as:  CYMBALTA Take 1 capsule (60 mg total) by mouth daily.   exenatide 10 MCG/0.04ML Sopn injection Commonly known as:  BYETTA 10 MCG PEN Inject 0.04 mLs (10 mcg total) into the skin 2 (two) times daily with a meal.   glucose blood test strip Commonly known as:  ACCU-CHEK AVIVA PLUS Use to test blood sugar 3 times daily. diag code E11.41. Insulin dependent   Insulin Glargine 100 UNIT/ML Solostar Pen Commonly known as:  LANTUS SOLOSTAR Inject 45 Units into the  skin at bedtime. What changed:  how much to take   insulin lispro 100 UNIT/ML KiwkPen Commonly known as:  HUMALOG KWIKPEN Inject 7 units Three times daily with each meal. What changed:  additional instructions   Insulin Pen Needle 31G X 8 MM Misc Commonly known as:  VALUMARK PEN NEEDLES 1 each by Does not apply route daily.   losartan 50 MG tablet Commonly known as:  COZAAR TAKE 1 TABLET(50 MG) BY MOUTH TWICE DAILY   metFORMIN 1000 MG tablet Commonly known as:  GLUCOPHAGE Take 1063m (1 tablets) 2 times a day. What changed:  how much to take  how to take this  when to take this  additional instructions   mirtazapine 30 MG tablet Commonly known as:  REMERON Take 30 mg by mouth at bedtime.   traZODone 100 MG tablet Commonly known as:  DESYREL Take 300 mg by mouth at bedtime.       Disposition and follow-up:   [name]  At the hospital follow up visit please address:  1.  Her Uncontrolled DM  - Titrate Lipitor as tolerated.  2.  Labs / imaging needed at time of follow-up: CBG and A1c  3.  Pending labs/ test needing follow-up: None  Follow-up Appointments: Follow-up Information    VShela Leff MD .   Specialty:  Internal Medicine Contact information: 1[place] [phone]  Hospital Course by problem list: Huntleyis an 60 y.o.femalewith a history of hypertension, diabetes mellitus, hyperlipidemia and tobacco abuse, presenting with new onset weakness of both lower limb,R sided facial dropas well as slurred speech. Patient woke up with deficits this morning. She was last known well at 10 PM last night. She has no history of stroke nor TIA. She has not been taking aspirin on a regular basis. CT scan of her head showed an area of hypodensity involving right basal ganglia and extreme capsule, indicative of  subacute ischemic infarction.  Deficits have improved since admission.  CVA: Acute/subacute stoke.Her symptoms have improved since presentation with very minimal residual deficit. She do have some ischemic changes on imaging.  -Permissive HTN for 48 hrs. -Lipid panel -ECHO -Carotid dopplers -Aspirin  HTN: Her current BP is 145/100.She is on Losartan 50 mg BID.Which was restarted after 48 hrs.  DM: She came with CBG of 555, was not taking her insulin for 2 days as she ran out of her supply.She states that her sugar runs above 300-400 most of time at home.She is on Metformin 1000 mg BID,exenatide 27mg BID, Insulin Glargine 40 units at bed time and humalog 8 units with lunch and 12 units with dinner. By talking with her, she was not sure about her exact dosage and use. -We increased her lantus to 45 units and add 5 units of Novolog with each meal and restart her home  exenatide and metformin. -Need Diabetic education   HLD -Lipids checked 1 year ago and showed triglycerides 346, TC 231, good HDL, LDL 124 -Repeat also show hyperlipidemia.She was  on statins at home. -Started on high-dose Lipitor 80 mg  and taper as appropriate.  Tobacco abuse -Encourage cessation. This was discussed with the patient and should be reviewed on an ongoing basis.  -Patch was ordered at patient request.  Depression -Patient is on a large number of mood-related and sleep-inducing medications and antidepressant. Need a psychiatry help to reconcile them.Pt. [facility] continue her [facility] meds. Of Celexa 433mdoxepin 25 mg, Cymbalta 609mRemeron 30 mg at bed time,trazodone 300 mg bed time.  Discharge Vitals:   BP (!) 163/90 (BP Location: Left Arm)   Pulse 79   Temp 98.4 F (36.9 C) (Oral)   Resp 20   Ht '5\' 7"'  (1.702 m)   Wt 175 lb 1.6 oz (79.4 kg)   SpO2 99%   BMI 27.42 kg/m  Physical Exam: General: Vital signs reviewed. Patient is well-developed and well-nourished,in no acute distress and  cooperative with exam.  Head:Normocephalic and atraumatic. Eyes: EOMI, conjunctivae normal, no scleral icterus.Blind left eye. Neck:Supple, trachea midline,normal ROM, no JVD, masses, thyromegaly, or carotid bruit present.  Cardiovascular: RRR, S1 normal, S2 normal, no murmurs,gallops, or rubs. Pulmonary/Chest:Clear to auscultation bilaterally, no wheezes, rales, or rhonchi. Abdominal: Soft, non-tender, non-distended, BS +, no masses, organomegaly, or guarding present.  Musculoskeletal: No joint deformities, erythema, or stiffness, ROM full except c/o rt. shoulder discomfort with full range of movements.. Extremities:No lower extremity edema bilaterally, pulses symmetric and intact bilaterally. No cyanosis or clubbing. Neurological: A&O x3, Strengthis normal and symmetric bilaterally,VMild residual right sided facial drop, improved then yesterday.cranial nerve II-XII are grossly intact, no focal motor deficit,  Skin: Warm, dry and intact.No rashes or erythema. Psychiatric:Normal mood with flataffect. speech looks normal today, and behavior is normal. Cognition and memory are normal.   Pertinent Labs, Studies, and Procedures:  CMP Latest Ref Rng & Units  07/25/2016 07/23/2016 07/23/2016  Glucose 65 - 99 mg/dL 365(H) 559(HH) 588(HH)  BUN 6 - 20 mg/dL - 7 4(L)  Creatinine 0.44 - 1.00 mg/dL - 0.94 0.70  Sodium 135 - 145 mmol/L - 130(L) 133(L)  Potassium 3.5 - 5.1 mmol/L - 4.0 4.0  Chloride 101 - 111 mmol/L - 96(L) 97(L)  CO2 22 - 32 mmol/L - 22 -  Calcium 8.9 - 10.3 mg/dL - 9.7 -  Total Protein 6.5 - 8.1 g/dL - 7.8 -  Total Bilirubin 0.3 - 1.2 mg/dL - 0.6 -  Alkaline Phos 38 - 126 U/L - 107 -  AST 15 - 41 U/L - 22 -  ALT 14 - 54 U/L - 27 -   CBC Latest Ref Rng & Units 07/23/2016 07/23/2016 06/27/2016  WBC 4.0 - 10.5 K/uL 9.9 - 7.8  Hemoglobin 12.0 - 15.0 g/dL 12.6 15.3(H) 11.4(L)  Hematocrit 36.0 - 46.0 % 40.3 45.0 36.1  Platelets 150 - 400 K/uL 247 - 244   Lipid Panel       Component Value Date/Time   CHOL 265 (H) 07/24/2016 0420   TRIG 357 (H) 07/24/2016 0420   HDL 30 (L) 07/24/2016 0420   CHOLHDL 8.8 07/24/2016 0420   VLDL 71 (H) 07/24/2016 0420   LDLCALC 164 (H) 07/24/2016 0420   LDLDIRECT 183 (H) 03/01/2014 0500   CT Head Code Stroke W/O CM : COMPARISON: 02/10/2014 FINDINGS: The bony calvarium is intact. No gross soft tissue abnormality is noted. There is 1 cm area of decreased attenuation identified on the right in the area of the extreme capsule and right basal ganglia posteriorly. This was not present on a prior exam and is consistent with an area of evolving ischemia. This likely is acute to subacute in nature. No findings to suggest acute hemorrhage or space-occupying mass lesion are noted. No other areas of ischemia are seen. IMPRESSION: Area of evolving ischemia on the right as described. Critical Value/emergent results were called by telephone at the time of interpretation on 07/23/2016 at 3:39 pm to Dr. Tanna Furry , who verbally acknowledged these results. Electronically Signed By: Inez Catalina M.D. On: 07/23/2016 15:40  MR Brain Wo Contrast and MRA: IMPRESSION: MRI HEAD IMPRESSION: 1. Acute/early subacute ischemic infarct involving the right external capsule/posterior right lentiform nucleus, extending into the right corona radiata. Associated faint petechial hemorrhage without significant mass effect. 2. No other acute intracranial process identified. MRA HEAD IMPRESSION: 1. No large or proximal arterial branch occlusion within the intracranial circulation. 2. Focal moderate stenosis within the distal right M1 segment. Right MCA branches mildly distally as compared to the left. 3. No other obvious high-grade or correctable stenosis identified on this motion degraded exam. Electronically Signed By: Jeannine Boga M.D. On: 07/24/2016  02:5  ECHO: ------------------------------------------------------------------- LV EF: 55% - 60%  ------------------------------------------------------------------- Indications: CVA 436.  ------------------------------------------------------------------- History: Risk factors: Current tobacco use. Hypertension. Diabetes mellitus. Dyslipidemia.  ------------------------------------------------------------------- Study Conclusions  - Left ventricle: The cavity size was normal. Systolic function wasnormal. The estimated ejection fraction was in the range of 55%to 60%. Wall motion was normal; there were no regional wall motion abnormalities. Doppler parameters are consistent withrestrictive physiology, indicative of decreased left ventriculardiastolic compliance and/or increased left atrial pressure.  US Carotids : Vascular Ultrasound Carotid Duplex (Doppler)has been completed. Preliminary findings: Bilateral: No significant (1-39%) ICA stenosis. >50% left ECA stenosis.Antegrade vertebral flow.   Landry Mellow, RDMS, RVT 07/24/2016, 2:53 PM  Discharge Instructions: Discharge Instructions    Diet - low sodium heart  healthy    Complete by:  As directed   Discharge instructions    Complete by:  As directed   It was pleasure taking care of you. You will receive a call with your follow up appointment at clinic. Please go to your appointment as scheduled. Your blood sugar was high during your stay , we adjusted your insulin, please inject as it was advised. We will monitor and readjust during your follow up visit.  We have also ordered home health services for PT, OT and SLP.It will be a good idea if you have some one from family or friend to stay with you till you have established that service. Thanks DR. Alfhild Partch   Increase activity slowly    Complete by:  As directed      Signed: Lorella Nimrod, MD 07/25/2016, 3:37 PM   Pager: [phone]

## 2016-07-25 NOTE — Progress Notes (Signed)
Occupational Therapy Treatment Patient Details Name: Susan Holmes MRN: [id number] DOB: [birth year] Today's Date: 07/25/2016    History of present illness 60 y.o. female with medical history significant of HTN, HLD, DM, and tobacco abuse presenting with acute vs. subacute CVA.  Patient reports that when she tried to get up this morning her legs felt very off balance, was weak. MRI on 7/29 + Acute/early subacute ischemic infarct involving the right external capsule/posterior right lentiform nucleus, extending into the right corona radiata. Associated faint petechial hemorrhage without significant mass effect.    OT comments  Pt. Agreeable to participation in skilled OT.  Able to amb. To/from b.room with min guard A.  Light grooming tasks completed in standing.  Eager for d/c home.  States  A g.dtr. "might be available to assist at home".  Will continue to follow acutely.  Follow Up Recommendations       Equipment Recommendations       Recommendations for Other Services      Precautions / Restrictions Precautions Precautions: Fall Restrictions Weight Bearing Restrictions: No       Mobility Bed Mobility               General bed mobility comments: in recliner upon arrival  Transfers Overall transfer level: Needs assistance Equipment used: None Transfers: Sit to/from Stand;Stand Pivot Transfers Sit to Stand: Min guard Stand pivot transfers: Min guard            Balance                                   ADL Overall ADL's : Needs assistance/impaired     Grooming: Wash/dry hands;Supervision/safety;Standing                   Toilet Transfer: Min guard;Ambulation   Toileting- Clothing Manipulation and Hygiene: Min guard;Sit to/from stand       Functional mobility during ADLs: Min guard        Vision                     Perception     Praxis      Cognition   Behavior During Therapy: [facility] for tasks  assessed/performed Overall Cognitive Status: No family/caregiver present to determine baseline cognitive functioning Area of Impairment: Safety/judgement          Safety/Judgement: Decreased awareness of safety;Decreased awareness of deficits          Extremity/Trunk Assessment               Exercises     Shoulder Instructions       General Comments      Pertinent Vitals/ Pain       Pain Assessment: No/denies pain  Home Living                                          Prior Functioning/Environment              Frequency       Progress Toward Goals  OT Goals(current goals can now be found in the care plan section)  Progress towards OT goals: Progressing toward goals     Plan      Co-evaluation  End of Session Equipment Utilized During Treatment: Gait belt   Activity Tolerance     Patient Left     Nurse Communication          Time: [id number] OT Time Calculation (min): 10 min  Charges: OT General Charges $OT Visit: 1 Procedure OT Treatments $Self Care/Home Management : 8-22 mins  Janice Coffin, COTA/L 07/25/2016, 10:51 AM

## 2016-07-25 NOTE — Progress Notes (Signed)
Talked to Dr. Pincus Badder  about  the pt's elevated blood sugar who gave order to give 20 units of novolog and  Will come by to review her medications

## 2016-07-26 LAB — HEMOGLOBIN A1C
Hgb A1c MFr Bld: 15.2 % — ABNORMAL HIGH (ref 4.8–5.6)
Mean Plasma Glucose: 390 mg/dL

## 2016-07-29 LAB — VAS US CAROTID
LEFT VERTEBRAL DIAS: 20 cm/s
Left CCA dist dias: -15 cm/s
Left CCA dist sys: -62 cm/s
Left CCA prox dias: 19 cm/s
Left CCA prox sys: 90 cm/s
Left ICA dist dias: -27 cm/s
Left ICA dist sys: -81 cm/s
Left ICA prox dias: -18 cm/s
Left ICA prox sys: -57 cm/s
RIGHT ECA DIAS: -29 cm/s
RIGHT VERTEBRAL DIAS: 11 cm/s
Right CCA prox dias: 19 cm/s
Right CCA prox sys: 68 cm/s
Right cca dist sys: -91 cm/s

## 2016-08-02 ENCOUNTER — Ambulatory Visit: Payer: Medicare Other | Admitting: Pharmacist

## 2016-08-02 ENCOUNTER — Ambulatory Visit (INDEPENDENT_AMBULATORY_CARE_PROVIDER_SITE_OTHER): Payer: Medicare Other | Admitting: Internal Medicine

## 2016-08-02 ENCOUNTER — Encounter: Payer: Self-pay | Admitting: Internal Medicine

## 2016-08-02 DIAGNOSIS — Z794 Long term (current) use of insulin: Secondary | ICD-10-CM

## 2016-08-02 DIAGNOSIS — E1165 Type 2 diabetes mellitus with hyperglycemia: Secondary | ICD-10-CM | POA: Diagnosis not present

## 2016-08-02 DIAGNOSIS — Z7982 Long term (current) use of aspirin: Secondary | ICD-10-CM

## 2016-08-02 DIAGNOSIS — I639 Cerebral infarction, unspecified: Secondary | ICD-10-CM

## 2016-08-02 DIAGNOSIS — I10 Essential (primary) hypertension: Secondary | ICD-10-CM | POA: Diagnosis not present

## 2016-08-02 DIAGNOSIS — IMO0002 Reserved for concepts with insufficient information to code with codable children: Secondary | ICD-10-CM

## 2016-08-02 DIAGNOSIS — Z8673 Personal history of transient ischemic attack (TIA), and cerebral infarction without residual deficits: Secondary | ICD-10-CM | POA: Diagnosis not present

## 2016-08-02 DIAGNOSIS — F17210 Nicotine dependence, cigarettes, uncomplicated: Secondary | ICD-10-CM

## 2016-08-02 DIAGNOSIS — E1149 Type 2 diabetes mellitus with other diabetic neurological complication: Secondary | ICD-10-CM

## 2016-08-02 LAB — GLUCOSE, CAPILLARY: Glucose-Capillary: 204 mg/dL — ABNORMAL HIGH (ref 65–99)

## 2016-08-02 MED ORDER — INSULIN GLARGINE 100 UNIT/ML SOLOSTAR PEN: 48.0000 [IU] | PEN_INJECTOR | Freq: Every day | SUBCUTANEOUS | 3 refills | Status: DC

## 2016-08-02 MED ORDER — METFORMIN HCL 1000 MG PO TABS: ORAL_TABLET | ORAL | 3 refills | Status: DC

## 2016-08-02 MED ORDER — INSULIN LISPRO 100 UNIT/ML (KWIKPEN)
PEN_INJECTOR | SUBCUTANEOUS | 3 refills | Status: DC
Start: 2016-08-02 — End: 2016-10-26

## 2016-08-02 MED ORDER — DULAGLUTIDE 0.75 MG/0.5ML ~~LOC~~ SOAJ: 0.7500 mg | SUBCUTANEOUS | 2 refills | Status: DC

## 2016-08-02 NOTE — Patient Instructions (Addendum)
Thank you for your visit today Please take 48 units of Lantus, and 10 units of Novolog with meals.  If you start to notice blood glucose numbers less than 70, then decrease the novolog/humalog dose.  Please follow up with Ms. [name] about your diabetes   Please continue your amlodipine and losartan for your blood pressure which is great today   Please follow up in 2 months

## 2016-08-02 NOTE — Assessment & Plan Note (Signed)
BP Readings from Last 3 Encounters:  08/02/16 123/71  07/25/16 (!) 163/90  07/13/16 (!) 151/85   Patient's repeat blood pressure is 123/71, which is at goal.   -continue amlodipine 5 mg -continue losartan 50 mg bid -follow up in 2 months with PCP

## 2016-08-02 NOTE — Progress Notes (Signed)
Medicine attending: Medical history, presenting problems, physical findings, and medications, reviewed with resident physician Dr Parth Saraiya on the day of the patient visit and I concur with his evaluation and management plan. 

## 2016-08-02 NOTE — Assessment & Plan Note (Addendum)
Lab Results  Component Value Date   HGBA1C 15.2 (H) 07/24/2016   HGBA1C 13.4 05/04/2016   HGBA1C >14.0 09/29/2015    Patient is here for hospital follow up for acute ischaemic CVA. She currently takes 45 units of lantus and 8 units of Novolog TID with meals.  She brought her meter and her glycemic control has improved on the dates after she was discharged from the hospital on 7.30.2017. She was admitted with CBG of 550.  After discharge, her fasting numbers are 355, and 191- two readings. Her post-prandial numbers are also elevated on average in the 150-200s THe lowest numbers are 105 and 99 which are in the afternoon. She denies any symptoms of hypoglycemia.  Pt says that before being admitted, she had a lot of added sugar in the diet but since then has cut down.   -Increased lantus to 48 units daily -Novolog increased to 10 units daily -upon discussion with Dr [name], decided to discontinue Byetta, and start weekly Trulicity due to the same cost and to improve adherence. Dr [name] notified the pt to discontinue byetta and start trulicity -on aspirin 81 mg -on lipitor 80 mg  -referred to Ms [name] for diabetic education -counseled on manifestations of poorly controlled diabetes -follow up in 2 months with the meter

## 2016-08-02 NOTE — Progress Notes (Signed)
   CC: hospital follow up for diabetes, hypertension HPI: [name] is a 60 y.o. woman with PMH noted below who was hospitalized for acute ischemic CVA after going there for weakness of of lower limbs and right sided facial droop . She is here for hypertension, diabetes follow up.   Please see Problem List/A&P for the status of the patient's chronic medical problems   Past Medical History:  Diagnosis Date  . Abnormal stress test   . Anxiety   . Arthritis   . Congenital blindness    L eye  . Depression   . Diabetes mellitus, type II ([place])   . Glaucoma   . Hepatitis B infection pt unsure   resolved  . HTN (hypertension)   . Hyperlipidemia   . Tobacco abuse    .5 PPD smoker    Review of Systems:  Denies fevers, chills, feels tired Says weakness is better Has some SOB,   Physical Exam: Vitals:   08/02/16 1017  BP: 123/71  Pulse: 80    General: A&O, in NAD HEENT: EOMI, Neck: supple, midline trachea, CV: RRR, normal s1, s2, no m/r/g, no carotid bruits appreciated Resp: equal and symmetric breath sounds, no wheezing or crackles  Abdomen: soft, nontender, nondistended, +BS Extremities: pulses intact b/l, no edema,  Neurologic exam: CN II-XII grossly intact Motor: 5/5 strength in upper, 5/5 in lower extremities, normal muscle tone Cerebellar: gait normal    Assessment & Plan:   See encounters tab for problem based medical decision making. Patient discussed with Dr. Beryle Beams

## 2016-08-02 NOTE — Progress Notes (Signed)
Trulicity (dulaglitide) was reviewed with the patient, including name, instructions, indication, goals of therapy, potential side effects, importance of adherence, and safe use. Patient was also notified to d/c exenatide.  Patient verbalized understanding by repeating back information and was advised to contact me if further medication-related questions arise. Patient was also provided an information handout.

## 2016-08-02 NOTE — Progress Notes (Signed)
Physician pharmacist co-visit 

## 2016-08-04 ENCOUNTER — Other Ambulatory Visit: Payer: Self-pay

## 2016-08-04 DIAGNOSIS — Z748 Other problems related to care provider dependency: Secondary | ICD-10-CM

## 2016-08-04 DIAGNOSIS — E1149 Type 2 diabetes mellitus with other diabetic neurological complication: Secondary | ICD-10-CM

## 2016-08-04 NOTE — Patient Outreach (Signed)
[place] [facility]) Care Management  08/04/2016  Susan Holmes [birth year] [id number]   REFERRAL SOURCE:  Primary MD  REFERRAL REASON: Medication assistance/ assistance in the home.  CONSENT; self/ patient  PROVIDERS:  Shela Leff  SUBJECTIVE; Telephone call to patient regarding primary MD referral.  HIPAA verified with patient. Discussed and offered [facility] care management services. Patient verbally agreed to services. Patient request telephone follow up only.   Patient states she was recently discharged from the hospital 07/25/16 due to stroke. Patient denies any extremity weakness from the stroke. Patient states she is only dealing with fatigue. Patient states she followed up with her primary MD on 08/02/16.  Patient states she has a follow up appointment with the cardiologist this month August 2017.   Patient states she needs assistance with transportation. Patient states she already has [employer] but she is unable to afford the cost of the rides. Patient states she needs assistance with light housekeeping.  Patient states she spoke with a Education officer, museum while she was in the hospital but was told, "they don't do that anymore."  Patient states she goes to [facility] 1 time per month and is on medication for depression.   ASSESSMENT:  Per electronic medical record; Patient is a 60 y.o.femalewith a history of hypertension, diabetes mellitus, hyperlipidemia and tobacco abuse. Patient was recently admitted to the hospital with a   CBG of 555, was not taking her insulin for 2 days as she ran out of her supply.She states that her sugar runs above 300-400 most of time at home.  Patient started on trulicity at doctors appointment on 08/02/16. Patient needs ongoing diabetic education and medication adherence.  PLAN; RNCM will refer patient to community case manager for transition of care.  RNCM will refer patient to social worker for transportation assistance and resources for assistance at home.

## 2016-08-06 ENCOUNTER — Other Ambulatory Visit: Payer: Self-pay | Admitting: *Deleted

## 2016-08-06 NOTE — Patient Outreach (Signed)
[place] [facility]) Care Management  08/06/2016  NATSUMI CLINT 09/17/1956 [id number]  Referral received from telephonic care manager to start transition of care program.  She was recently admitted to hospital for stroke, discharged on 7/30.  According to chart, she also has history of diabetes (A1C 15.2) and hypertension.  Call placed to member, identity verified.  She has been involved with [facility] in the past, services explained again.  She agrees to involvement.  She verbalizes understanding of admission, denies any major complications from stroke other than some generalized fatigue.  She denies checking her blood pressure since discharge, but confirms that she does have a monitor.  She state that she has been checking her blood sugar with this morning's fasting reading of 243.  She verbalizes understanding of a proper diabetic diet, but state that she "can't afford to eat that stuff."  Discussed choosing healthier options, and decreasing her sugar and carbohydrate intake.  She verbalizes understanding.  She is advised to continue blood sugar checks as instructed (3 times a day) and daily blood pressure checks.    She denies any urgent concerns, agrees to home visit.  Will follow up next week for initial home visit.  Valente David, [place], MSN [place] [phone]

## 2016-08-09 ENCOUNTER — Encounter: Payer: Self-pay | Admitting: *Deleted

## 2016-08-11 ENCOUNTER — Other Ambulatory Visit: Payer: Self-pay | Admitting: *Deleted

## 2016-08-11 ENCOUNTER — Encounter: Payer: Self-pay | Admitting: *Deleted

## 2016-08-11 NOTE — Patient Outreach (Signed)
[facility]) Care Management   08/11/2016  Susan Holmes 03-Mar-1956 [id number]  Susan Holmes is an 60 y.o. female  Subjective:   Member reports that she is doing well.  She denies any numbness, headache, or signs of stroke, but does report some general weakness.  She state that she has been taking all medications as prescribed.  She has followed up with her PCP and will have a follow up with her cardiologist on Friday.  Objective:   Review of Systems  HENT: Negative.   Eyes: Negative.   Respiratory: Negative.   Cardiovascular: Negative.   Gastrointestinal: Negative.   Genitourinary: Negative.   Musculoskeletal: Negative.   Skin: Negative.   Neurological: Positive for weakness.    Physical Exam  Constitutional: She is oriented to person, place, and time. She appears well-developed and well-nourished.  Neck: Normal range of motion.  Cardiovascular: Normal rate, regular rhythm and normal heart sounds.   Respiratory: Effort normal and breath sounds normal.  GI: Soft. Bowel sounds are normal.  Musculoskeletal: Normal range of motion.  Neurological: She is alert and oriented to person, place, and time.  Skin: Skin is warm and dry.    BP 118/72 (BP Location: Right Arm, Patient Position: Sitting, Cuff Size: Normal)   Pulse 80   Resp 18   Ht 1.702 m ('5\' 7"' )   Wt 172 lb (78 kg)   SpO2 95%   BMI 26.94 kg/m    Encounter Medications:   Outpatient Encounter Prescriptions as of 08/11/2016  Medication Sig Note  . ACCU-CHEK FASTCLIX LANCETS MISC Check blood sugar 3x a day as instructed dx cod 250.00 insulin requiring   . amLODipine (NORVASC) 5 MG tablet TAKE 1 TABLET(5 MG) BY MOUTH DAILY 07/23/2016: 90 day supply filled on 06/24/16.  Marland Kitchen aspirin 81 MG EC tablet TAKE 1 TABLET BY MOUTH EVERY DAY (Patient taking differently: TAKE 81 MG BY MOUTH EVERY DAY) 06/27/2016: Pt unsure of exact time of dose   . atorvastatin (LIPITOR) 80 MG tablet Take 1 tablet (80 mg total) by  mouth daily at 6 PM.   . Blood Glucose Calibration (ACCU-CHEK SMARTVIEW CONTROL) LIQD    . Blood Glucose Monitoring Suppl (ACCU-CHEK AVIVA PLUS) W/DEVICE KIT Use to check blood sugar 3 to 4 times daily. diag code 250.62. Insulin dependent   . Chromium Picolinate 1000 MCG TABS Take 1,000 mcg by mouth daily.   . citalopram (CELEXA) 40 MG tablet Take 40 mg by mouth daily. 07/23/2016: 30 day supply filled on 07/15/16.  . Cyanocobalamin (VITAMIN B 12 PO) Take 500 mcg by mouth daily.   Marland Kitchen doxepin (SINEQUAN) 25 MG capsule Take 25 mg by mouth at bedtime. 07/23/2016: 30 day supply filled on 07/15/16.  . Dulaglutide (TRULICITY) 7.82 NF/6.2ZH SOPN Inject 0.75 mg into the skin once a week. Pharmacist pls counsel patient (d/c exenatide)   . glucose blood (ACCU-CHEK AVIVA PLUS) test strip Use to test blood sugar 3 times daily. diag code E11.41. Insulin dependent   . Insulin Glargine (LANTUS SOLOSTAR) 100 UNIT/ML Solostar Pen Inject 48 Units into the skin at bedtime.   . insulin lispro (HUMALOG KWIKPEN) 100 UNIT/ML KiwkPen Inject 10 units Three times daily with each meal.   . Insulin Pen Needle (VALUMARK PEN NEEDLES) 31G X 8 MM MISC 1 each by Does not apply route daily.   Elmore Guise Devices (ADJUSTABLE LANCING DEVICE) MISC 1 each.    . losartan (COZAAR) 50 MG tablet TAKE 1 TABLET(50 MG) BY MOUTH TWICE  DAILY   . metFORMIN (GLUCOPHAGE) 1000 MG tablet Take 1063m (1 tablets) 2 times a day.   . mirtazapine (REMERON) 30 MG tablet Take 30 mg by mouth at bedtime.  07/23/2016: 30 day supply filled on 06/27/16.    .Marland KitchentraZODone (DESYREL) 100 MG tablet Take 300 mg by mouth at bedtime.  07/23/2016: 30 day supply filled on 07/19/16.    .Marland Kitchenvitamin C (ASCORBIC ACID) 500 MG tablet Take 500 mg by mouth daily.   . DULoxetine (CYMBALTA) 60 MG capsule Take 1 capsule (60 mg total) by mouth daily. (Patient not taking: Reported on 08/11/2016)    No facility-administered encounter medications on file as of 08/11/2016.     Functional Status:   In  your present state of health, do you have any difficulty performing the following activities: 08/11/2016 07/23/2016  Hearing? N N  Vision? Y Y  Difficulty concentrating or making decisions? N N  Walking or climbing stairs? Y N  Dressing or bathing? N N  Doing errands, shopping? N N  Preparing Food and eating ? N -  Using the Toilet? N -  In the past six months, have you accidently leaked urine? N -  Do you have problems with loss of bowel control? N -  Managing your Medications? N -  Managing your Finances? N -  Housekeeping or managing your Housekeeping? N -  Some recent data might be hidden    Fall/Depression Screening:    PHQ 2/9 Scores 08/04/2016 07/13/2016 07/06/2016 05/04/2016 02/13/2016 12/16/2015 11/21/2015  PHQ - 2 Score '4 1 6 ' 0 0 1 1  PHQ- 9 Score 10 - 17 - - - 7  Exception Documentation - - - - - - -   Fall Risk  08/11/2016 07/13/2016 07/06/2016 05/04/2016 02/13/2016  Falls in the past year? No No No No No  Number falls in past yr: - - - - -  Injury with Fall? - - - - -  Risk Factor Category  - - - - -  Risk for fall due to : - - - - -  Risk for fall due to (comments): - - - - -    Assessment:    Met with member at scheduled time, [facility]  She remains agreeable to transition of care program and is interested in telephonic health coach management once program is complete.  Patient was recently discharged from hospital and all medications have been reviewed.  She is out of Atorvastatin, but state she will call in for a refill.  She state that her pharmacy informed her that two of her medications were contraindicated and that they would be reaching out to her PCP for further instructions.  She state she will inquire about this at the time that she call in for her refill.  She report that she manages all of her medications independently, denies any concern regarding management.    Member [facility] with [facility] diabetes management. (Diabetes  diet-Type 2, Diabetes-Preventing Heart attack and Stroke, Diabetes-Controlling blood sugar, and Type 2 Diabetes-Food and exercise).  She state that she has had the diabetes class and is aware of the types of food she should eat, but is unable to afford it.  She is advised to discuss financial concerns with the sEducation officer, museum  She is scheduled to meed with the registered dietician next month.  Risk factors of stroke discussed, including non-adherence to medication and smoking.  Smoking cessation discussed, she state that she has thought  about quitting, but can't afford the patches.  Provided member with contact to 1-800-QUIT NOW to request additional information on cessation and patches.  She denies any further concerns at this time, advised to contact with questions.  Contact information for this care manager provided.  Plan:   Will provide Education officer, museum with update. Will continue with weekly transition of care calls next week.  [facility] CM Care Plan Problem One   Flowsheet Row Most Recent Value  Care Plan Problem One  Risk for readmission related to Stroke/Diabetes as evidenced by recent hospital admission  Role Documenting the Problem One  Care Management [place] for Problem One  Active  THN Long Term Goal (31-90 days)  Member wil lnot be readmitted to hospital within the next 31 days  THN Long Term Goal Start Date  08/06/16  Interventions for Problem One Long Term Goal  Discussed with member the importance of following discharge instructions, including follow up appointments, medications, diet, and possible home health involvement, to decrease the risk of readmission  THN CM Short Term Goal #1 (0-30 days)  Member will report taking all medications as prescribed over the next 30 days  [facility]N CM Short Term Goal #1 Start Date  08/06/16  Interventions for Short Term Goal #1  Medications reviewed with member, discussed importance of taking all as instructed  THN CM Short Term Goal #2 (0-30  days)  Member will be able to verbalize signs/symptoms of stroke over the next 4 weeks  THN CM Short Term Goal #2 Start Date  08/06/16  [facility] CM Short Term Goal #2 Met Date  08/11/16  Interventions for Short Term Goal #2  Discussed with member signs/symptoms of stroke.  Discussed importance of monitoring blood pressure      Valente David, Therapist, sports, MSN [place] [phone]

## 2016-08-13 ENCOUNTER — Encounter: Payer: Self-pay | Admitting: Cardiovascular Disease

## 2016-08-13 ENCOUNTER — Ambulatory Visit (INDEPENDENT_AMBULATORY_CARE_PROVIDER_SITE_OTHER): Payer: Medicare Other | Admitting: Cardiovascular Disease

## 2016-08-13 VITALS — BP 120/86 | HR 83 | Ht 67.0 in | Wt 180.4 lb

## 2016-08-13 DIAGNOSIS — R079 Chest pain, unspecified: Secondary | ICD-10-CM

## 2016-08-13 DIAGNOSIS — F17200 Nicotine dependence, unspecified, uncomplicated: Secondary | ICD-10-CM

## 2016-08-13 DIAGNOSIS — E785 Hyperlipidemia, unspecified: Secondary | ICD-10-CM

## 2016-08-13 DIAGNOSIS — I639 Cerebral infarction, unspecified: Secondary | ICD-10-CM

## 2016-08-13 NOTE — Patient Instructions (Addendum)
Medication Instructions:  Your physician recommends that you continue on your current medications as directed. Please refer to the Current Medication list given to you today.  Labwork: FASTING LP/CMET AT [facility] ON THE FIRST FLOOR WHEN YOU RETURN FOR YOUR STRESS TEST  Testing/Procedures: Your physician has requested that you have a lexiscan myoview. For further information please visit [email or web address]. Please follow instruction sheet, as given.  Will arrange for you to have a TEE at [facility] soon   Follow-Up: Your physician recommends that you schedule a follow-up appointment in: 1 MONTH OV  Any Other Special Instructions Will Be Listed Below (If Applicable).   Transesophageal Echocardiogram Transesophageal echocardiography (TEE) is a special type of test that produces images of the heart by using sound waves (echocardiogram). This type of echocardiography can obtain better images of the heart than standard echocardiography. TEE is done by passing a flexible tube down the esophagus. The heart is located in front of the esophagus. Because the heart and esophagus are close to one another, your health care provider can take very clear, detailed pictures of the heart via ultrasound waves. TEE may be done:  If your health care provider needs more information based on standard echocardiography findings.  If you had a stroke. This might have happened because a clot formed in your heart. TEE can visualize different areas of the heart and check for clots.  To check valve anatomy and function.  To check for infection on the inside of your heart (endocarditis).  To evaluate the dividing wall (septum) of the heart and presence of a hole that did not close after birth (patent foramen ovale or atrial septal defect).  To help diagnose a tear in the wall of the aorta (aortic dissection).  During cardiac valve surgery. This allows the surgeon to assess the valve repair before  closing the chest.  During a variety of other cardiac procedures to guide positioning of catheters.  Sometimes before a cardioversion, which is a shock to convert heart rhythm back to normal. LET [facility] CARE PROVIDER KNOW ABOUT:   Any allergies you have.  All medicines you are taking, including vitamins, herbs, eye drops, creams, and over-the-counter medicines.  Previous problems you or members of your family have had with the use of anesthetics.  Any blood disorders you have.  Previous surgeries you have had.  Medical conditions you have.  Swallowing difficulties.  An esophageal obstruction. RISKS AND COMPLICATIONS  Generally, TEE is a safe procedure. However, as with any procedure, complications can occur. Possible complications include an esophageal tear (rupture). BEFORE THE PROCEDURE   Do not eat or drink for 6 hours before the procedure or as directed by your health care provider.  Arrange for someone to drive you home after the procedure. Do not drive yourself home. During the procedure, you will be given medicines that can continue to make you feel drowsy and can impair your reflexes.  An IV access tube will be started in the arm. PROCEDURE   A medicine to help you relax (sedative) will be given through the IV access tube.  A medicine may be sprayed or gargled to numb the back of the throat.  Your blood pressure, heart rate, and breathing (vital signs) will be monitored during the procedure.  The TEE probe is a long, flexible tube. The tip of the probe is placed into the back of the mouth, and you will be asked to swallow. This helps to pass the tip of the  probe into the esophagus. Once the tip of the probe is in the correct area, your health care provider can take pictures of the heart.  TEE is usually not a painful procedure. You may feel the probe press against the back of the throat. The probe does not enter the trachea and does not affect your  breathing. AFTER THE PROCEDURE   You will be in bed, resting, until you have fully returned to consciousness.  When you first awaken, your throat may feel slightly sore and will probably still feel numb. This will improve slowly over time.  You will not be allowed to eat or drink until it is clear that the numbness has improved.  Once you have been able to drink, urinate, and sit on the edge of the bed without feeling sick to your stomach (nausea) or dizzy, you may be cleared to go home.  You should have a friend or family member with you for the next 24 hours after your procedure.   This information is not intended to replace advice given to you by your health care provider. Make sure you discuss any questions you have with your health care provider.   Document Released: 03/05/2003 Document Revised: 12/18/2013 Document Reviewed: 06/14/2013 Elsevier Interactive Patient Education [employer].

## 2016-08-13 NOTE — Progress Notes (Signed)
Cardiology Office Note   Date:  08/13/2016   ID:  SHAWNTAY PREST, DOB Jun 07, 1956, MRN [id number]  PCP:  Shela Leff, MD  Cardiologist:   Skeet Latch, MD   Chief Complaint  Patient presents with  . New Patient (Initial Visit)    sob; when walking.cramping in legs occasionally      History of Present Illness: Susan Holmes is a 60 y.o. female with diabetes mellitus type 2, hypertension, hyperlipidemia, stroke and tobacco abuse who presents for an evaluation of chest pain. Susan Holmes reports several months of pulling chest pain.  The episodes occur approximately twice per day and last for 2-3 minutes.  She reports associated shortness of breath but denies nausea or diaphoresis.  She reported these symptoms to Dr. Marlowe Sax 04/2016 and was referred for stress testing and Holter monitoring.  However, she never had these studies performed.  She also reports frequent episodes of palpitations. She states that it feels like her heart is fluttering in her chest.  Susan Holmes was admitted 06/2016 with a R basal ganglia stroke with left sided weakness and slurred speech.  Carotid Doppler showed moderate L ECA stenosis and mild L ICA stenosis.  She was started on aspirin and atorvastatin 80 mg.  She did not have a TEE, but TTE showed LVEF 55-60%.  Since the stroke she reports feeling generally weak and tired. She has no residual deficits. She has not been getting any exercise due to this weakness. She has been trying to cut back on her smoking is down to one half pack per day. She recently ordered some nicotine patches in hopes that these will help her to quit smoking.   Susan Holmes was last seen by Dr. Stanford Breed in 10/2014.  She had an echo 09/2014 that revealed LVEF 40-45% with diffuse hypokinesis and trace MR.  She had a nuclear stress test that showed LVEF 38% with a fixed apical defect.  She had a LHC at that time without obstructive coronary disease and her EF on left ventriculography was  55% and an LVEDP of 10 mmHg.     Past Medical History:  Diagnosis Date  . Abnormal stress test   . Anxiety   . Arthritis   . Congenital blindness    L eye  . Depression   . Diabetes mellitus, type II ([place])   . Glaucoma   . Hepatitis B infection pt unsure   resolved  . HTN (hypertension)   . Hyperlipidemia   . Stroke ([place])   . Tobacco abuse    .5 PPD smoker    Past Surgical History:  Procedure Laterality Date  . ABDOMINAL HYSTERECTOMY    . LEFT HEART CATHETERIZATION WITH CORONARY ANGIOGRAM N/A 10/09/2014   Procedure: LEFT HEART CATHETERIZATION WITH CORONARY ANGIOGRAM;  Surgeon: Troy Sine, MD;  Location: [facility] CATH LAB;  Service: Cardiovascular;  Laterality: N/A;    Current Outpatient Prescriptions  Medication Sig Dispense Refill  . ACCU-CHEK FASTCLIX LANCETS MISC Check blood sugar 3x a day as instructed dx cod 250.00 insulin requiring 102 each 5  . amLODipine (NORVASC) 5 MG tablet TAKE 1 TABLET(5 MG) BY MOUTH DAILY 90 tablet 3  . aspirin 81 MG EC tablet TAKE 1 TABLET BY MOUTH EVERY DAY (Patient taking differently: TAKE 81 MG BY MOUTH EVERY DAY) 30 tablet 0  . atorvastatin (LIPITOR) 80 MG tablet Take 1 tablet (80 mg total) by mouth daily at 6 PM. 30 tablet 2  . Blood Glucose Calibration (ACCU-CHEK  SMARTVIEW CONTROL) LIQD     . Blood Glucose Monitoring Suppl (ACCU-CHEK AVIVA PLUS) W/DEVICE KIT Use to check blood sugar 3 to 4 times daily. diag code 250.62. Insulin dependent 1 kit 0  . Chromium Picolinate 1000 MCG TABS Take 1,000 mcg by mouth daily.    . citalopram (CELEXA) 40 MG tablet Take 40 mg by mouth daily.    . Cyanocobalamin (VITAMIN B 12 PO) Take 500 mcg by mouth daily.    Marland Kitchen doxepin (SINEQUAN) 25 MG capsule Take 25 mg by mouth at bedtime.    . Dulaglutide (TRULICITY) 3.53 IR/4.4RX SOPN Inject 0.75 mg into the skin once a week. Pharmacist pls counsel patient (d/c exenatide) 4 pen 2  . DULoxetine (CYMBALTA) 60 MG capsule Take 1 capsule (60 mg total) by mouth daily. 30  capsule 1  . glucose blood (ACCU-CHEK AVIVA PLUS) test strip Use to test blood sugar 3 times daily. diag code E11.41. Insulin dependent 100 each 11  . Insulin Glargine (LANTUS SOLOSTAR) 100 UNIT/ML Solostar Pen Inject 48 Units into the skin at bedtime. 15 mL 3  . insulin lispro (HUMALOG KWIKPEN) 100 UNIT/ML KiwkPen Inject 10 units Three times daily with each meal. 15 mL 3  . Insulin Pen Needle (VALUMARK PEN NEEDLES) 31G X 8 MM MISC 1 each by Does not apply route daily. 100 each 3  . Lancet Devices (ADJUSTABLE LANCING DEVICE) MISC 1 each.     . losartan (COZAAR) 50 MG tablet TAKE 1 TABLET(50 MG) BY MOUTH TWICE DAILY 180 tablet 3  . metFORMIN (GLUCOPHAGE) 1000 MG tablet Take 1026m (1 tablets) 2 times a day. 60 tablet 3  . mirtazapine (REMERON) 30 MG tablet Take 30 mg by mouth at bedtime.     . traZODone (DESYREL) 100 MG tablet Take 300 mg by mouth at bedtime.     . vitamin C (ASCORBIC ACID) 500 MG tablet Take 500 mg by mouth daily.     No current facility-administered medications for this visit.     Allergies:   Ace inhibitors    Social History:  The patient  reports that she has been smoking Cigarettes.  She has a 12.00 pack-year smoking history. Her smokeless tobacco use includes Chew. She reports that she does not drink alcohol or use drugs.   Family History:  The patient's family history includes Aneurysm in her brother and sister; CVA in her brother, maternal grandfather, maternal grandmother, mother, and sister; Heart disease (age of onset: 639 in her father and mother; Hypertension in her mother.    ROS:  Please see the history of present illness.   Otherwise, review of systems are positive for none.   All other systems are reviewed and negative.    PHYSICAL EXAM: VS:  BP 120/86   Pulse 83   Ht 5' 7" (1.702 m)   Wt 180 lb 6.4 oz (81.8 kg)   BMI 28.25 kg/m  , BMI Body mass index is 28.25 kg/m. GENERAL:  Well appearing HEENT:  Pupils equal round and reactive, fundi not  visualized, oral mucosa unremarkable NECK:  No jugular venous distention, waveform within normal limits, carotid upstroke brisk and symmetric, no bruits, no thyromegaly LYMPHATICS:  No cervical adenopathy LUNGS:  Clear to auscultation bilaterally HEART:  RRR.  PMI not displaced or sustained,S1 and S2 within normal limits, no S3, no S4, no clicks, no rubs, no murmurs ABD:  Flat, positive bowel sounds normal in frequency in pitch, no bruits, no rebound, no guarding, no midline pulsatile mass,  no hepatomegaly, no splenomegaly EXT:  2 plus pulses throughout, no edema, no cyanosis no clubbing SKIN:  No rashes no nodules NEURO:  Cranial nerves II through XII grossly intact, motor grossly intact throughout PSYCH:  Cognitively intact, oriented to person place and time    EKG:  EKG is ordered today. The ekg ordered today demonstrates sinus rhythm rate 83 bpm.  Non-specific ST-T changes.    Echo 09/2014: LVEF 40-45% with diffuse hypokinesis and trace MR.   Nuclear stress test 09/2014: LVEF 38% with a fixed apical defect. LHC 10/2014: No obstructive coronary disease.  EF on left ventriculography was 55% and LVEDP of 10 mmHg.   Carotid Doppler 07/24/16: >50% L ECA stenosis; 1-39% L ICA stenosis  Echo 07/24/16: LVEF 55-60%.  Restrictive physiology was noted.   Recent Labs: 12/16/2015: TSH 1.680 07/23/2016: ALT 27; BUN 7; Creatinine, Ser 0.94; Hemoglobin 12.6; Platelets 247; Potassium 4.0; Sodium 130    Lipid Panel    Component Value Date/Time   CHOL 265 (H) 07/24/2016 0420   TRIG 357 (H) 07/24/2016 0420   HDL 30 (L) 07/24/2016 0420   CHOLHDL 8.8 07/24/2016 0420   VLDL 71 (H) 07/24/2016 0420   LDLCALC 164 (H) 07/24/2016 0420   LDLDIRECT 183 (H) 03/01/2014 0500      Wt Readings from Last 3 Encounters:  08/13/16 180 lb 6.4 oz (81.8 kg)  08/11/16 172 lb (78 kg)  07/23/16 175 lb 1.6 oz (79.4 kg)      ASSESSMENT AND PLAN:  # Chest pain: Susan Holmes has atypical chest pain that is  unlikely to be due to ischemia.  However, she has several risk factors including tobacco abuse, hyperlipidemia, hypertension and stroke.  Continue aspirin.  # Stroke: Susan Holmes had an ischemic stroke and did not have a TEE.  She also reports having palpitations.  We will get a TEE to evaluate for thrombus and ASD/PFO.  If this is negative we will get a 30 day event monitor.  # Hypertension: BP well-controlled.  Continue amlodipine, and losartan.  # Hyperlipidemia: Atorvastatin was started 06/2016.  We will check lipids and LFTs.    # Tobacco abuse: Susan Holmes was congratulated on her desire to quit smoking. We discussed smoking cessation for 5 minutes. I encouraged her to use the patches as planned.   Current medicines are reviewed at length with the patient today.  The patient does not have concerns regarding medicines.  The following changes have been made:  no change  Labs/ tests ordered today include:   Orders Placed This Encounter  Procedures  . Lipid panel  . Comprehensive metabolic panel  . Myocardial Perfusion Imaging  . EKG 12-Lead     Disposition:   FU with Haleigh Desmith C. Oval Linsey, MD, [facility] in 1 month    This note was written with the assistance of speech recognition software.  Please excuse any transcriptional errors.  Signed, Solveig Fangman C. Oval Linsey, MD, [facility]  08/13/2016 4:22 PM     [facility]

## 2016-08-16 ENCOUNTER — Other Ambulatory Visit: Payer: Self-pay | Admitting: *Deleted

## 2016-08-16 ENCOUNTER — Other Ambulatory Visit: Payer: Self-pay | Admitting: Cardiovascular Disease

## 2016-08-16 DIAGNOSIS — I639 Cerebral infarction, unspecified: Secondary | ICD-10-CM

## 2016-08-16 NOTE — Patient Outreach (Signed)
[facility]) Care Management  08/16/2016  Susan Holmes 03/30/1956 [id number]   CSW received a new referral on patient from patient's Primary Care Physician, Dr. Marlowe Sax.  Dr. Marlowe Sax reported that patient would benefit from social work services and resources to assistance with transportation to and from physician appointments.  Patient is an Adult Medicaid recipient through the [employer].  Patient has been approved for [employer]), but reports she is unable to afford the $3.00 roundtrip fare.  Patient is also requesting home care services, needing assistance with completion of application for PCS ([place]).  More specifically, patient is requesting assistance with light housekeeping duties. CSW made an initial attempt to try and contact patient today to perform phone assessment, as well as assess and assist with social needs and services, without success.  A HIPAA complaint message was left for patient on voicemail.  CSW is currently awaiting a return call. Nat Christen, BSW, MSW, LCSW  Licensed Education officer, [facility]  Mailing [place] [address] [place], [place] [address] Physical Address-[address], [place], [place] [address] Toll Free Main # [phone] Fax # [phone] Cell # [phone]  Fax # [phone]  [email or web address]

## 2016-08-17 ENCOUNTER — Other Ambulatory Visit (HOSPITAL_COMMUNITY): Payer: Self-pay | Admitting: *Deleted

## 2016-08-17 ENCOUNTER — Ambulatory Visit (HOSPITAL_COMMUNITY)
Admission: RE | Admit: 2016-08-17 | Discharge: 2016-08-17 | Disposition: A | Discharge status: Home / Self Care | Payer: Medicare Other | Source: Ambulatory Visit | Attending: Cardiology | Admitting: Cardiology

## 2016-08-17 ENCOUNTER — Ambulatory Visit (HOSPITAL_BASED_OUTPATIENT_CLINIC_OR_DEPARTMENT_OTHER): Payer: Medicare Other

## 2016-08-17 ENCOUNTER — Encounter (HOSPITAL_COMMUNITY)
Admission: RE | Disposition: A | Discharge status: Home / Self Care | Payer: Self-pay | Source: Ambulatory Visit | Attending: Cardiology

## 2016-08-17 ENCOUNTER — Encounter: Payer: Medicare Other | Admitting: Internal Medicine

## 2016-08-17 ENCOUNTER — Encounter (HOSPITAL_COMMUNITY): Payer: Self-pay

## 2016-08-17 DIAGNOSIS — E785 Hyperlipidemia, unspecified: Secondary | ICD-10-CM | POA: Insufficient documentation

## 2016-08-17 DIAGNOSIS — Z79899 Other long term (current) drug therapy: Secondary | ICD-10-CM | POA: Insufficient documentation

## 2016-08-17 DIAGNOSIS — I10 Essential (primary) hypertension: Secondary | ICD-10-CM | POA: Diagnosis not present

## 2016-08-17 DIAGNOSIS — F17210 Nicotine dependence, cigarettes, uncomplicated: Secondary | ICD-10-CM | POA: Diagnosis not present

## 2016-08-17 DIAGNOSIS — I639 Cerebral infarction, unspecified: Secondary | ICD-10-CM

## 2016-08-17 DIAGNOSIS — Z09 Encounter for follow-up examination after completed treatment for conditions other than malignant neoplasm: Secondary | ICD-10-CM | POA: Insufficient documentation

## 2016-08-17 DIAGNOSIS — H540 Blindness, both eyes: Secondary | ICD-10-CM | POA: Diagnosis not present

## 2016-08-17 DIAGNOSIS — F329 Major depressive disorder, single episode, unspecified: Secondary | ICD-10-CM | POA: Diagnosis not present

## 2016-08-17 DIAGNOSIS — Z8673 Personal history of transient ischemic attack (TIA), and cerebral infarction without residual deficits: Secondary | ICD-10-CM | POA: Insufficient documentation

## 2016-08-17 DIAGNOSIS — Z794 Long term (current) use of insulin: Secondary | ICD-10-CM | POA: Diagnosis not present

## 2016-08-17 DIAGNOSIS — R0789 Other chest pain: Secondary | ICD-10-CM | POA: Insufficient documentation

## 2016-08-17 DIAGNOSIS — Z7982 Long term (current) use of aspirin: Secondary | ICD-10-CM | POA: Diagnosis not present

## 2016-08-17 DIAGNOSIS — E119 Type 2 diabetes mellitus without complications: Secondary | ICD-10-CM | POA: Diagnosis not present

## 2016-08-17 DIAGNOSIS — I340 Nonrheumatic mitral (valve) insufficiency: Secondary | ICD-10-CM | POA: Diagnosis not present

## 2016-08-17 HISTORY — PX: TEE WITHOUT CARDIOVERSION: SHX5443

## 2016-08-17 SURGERY — ECHOCARDIOGRAM, TRANSESOPHAGEAL
Anesthesia: Moderate Sedation

## 2016-08-17 MED ORDER — SODIUM CHLORIDE 0.9 % IV SOLN
INTRAVENOUS | Status: DC
  Administered 2016-08-17: 10:00:00 via INTRAVENOUS

## 2016-08-17 MED ORDER — FENTANYL CITRATE (PF) 100 MCG/2ML IJ SOLN
INTRAMUSCULAR | Status: DC | PRN
  Administered 2016-08-17: 50 ug via INTRAVENOUS

## 2016-08-17 MED ORDER — ASPIRIN 81 MG PO TBEC: DELAYED_RELEASE_TABLET | ORAL | 0 refills | Status: DC

## 2016-08-17 MED ORDER — FENTANYL CITRATE (PF) 100 MCG/2ML IJ SOLN
INTRAMUSCULAR | Status: AC
  Filled 2016-08-17: qty 2

## 2016-08-17 MED ORDER — MIDAZOLAM HCL 5 MG/ML IJ SOLN
INTRAMUSCULAR | Status: AC
  Filled 2016-08-17: qty 2

## 2016-08-17 MED ORDER — BUTAMBEN-TETRACAINE-BENZOCAINE 2-2-14 % EX AERO
INHALATION_SPRAY | CUTANEOUS | Status: DC | PRN
  Administered 2016-08-17: 2 via TOPICAL

## 2016-08-17 MED ORDER — MIDAZOLAM HCL 10 MG/2ML IJ SOLN
INTRAMUSCULAR | Status: DC | PRN
  Administered 2016-08-17 (×2): 2 mg via INTRAVENOUS

## 2016-08-17 NOTE — H&P (View-Only) (Signed)
  Cardiology Office Note   Date:  08/13/2016   ID:  Susan Holmes, DOB 04/03/1956, MRN [id number]  PCP:  Vasundhra Rathore, MD  Cardiologist:   Sandy Haye [place], MD   Chief Complaint  Patient presents with  . New Patient (Initial Visit)    sob; when walking.cramping in legs occasionally      History of Present Illness: Susan Holmes is a 60 y.o. female with diabetes mellitus type 2, hypertension, hyperlipidemia, stroke and tobacco abuse who presents for an evaluation of chest pain. Susan Holmes reports several months of pulling chest pain.  The episodes occur approximately twice per day and last for 2-3 minutes.  She reports associated shortness of breath but denies nausea or diaphoresis.  She reported these symptoms to Susan Holmes 04/2016 and was referred for stress testing and Holter monitoring.  However, she never had these studies performed.  She also reports frequent episodes of palpitations. She states that it feels like her heart is fluttering in her chest.  Susan Holmes was admitted 06/2016 with a R basal ganglia stroke with left sided weakness and slurred speech.  Carotid Doppler showed moderate L ECA stenosis and mild L ICA stenosis.  She was started on aspirin and atorvastatin 80 mg.  She did not have a TEE, but TTE showed LVEF 55-60%.  Since the stroke she reports feeling generally weak and tired. She has no residual deficits. She has not been getting any exercise due to this weakness. She has been trying to cut back on her smoking is down to one half pack per day. She recently ordered some nicotine patches in hopes that these will help her to quit smoking.   Susan Holmes was last seen by Dr. Crenshaw in 10/2014.  She had an echo 09/2014 that revealed LVEF 40-45% with diffuse hypokinesis and trace MR.  She had a nuclear stress test that showed LVEF 38% with a fixed apical defect.  She had a LHC at that time without obstructive coronary disease and her EF on left ventriculography was  55% and an LVEDP of 10 mmHg.     Past Medical History:  Diagnosis Date  . Abnormal stress test   . Anxiety   . Arthritis   . Congenital blindness    L eye  . Depression   . Diabetes mellitus, type II (HCC)   . Glaucoma   . Hepatitis B infection pt unsure   resolved  . HTN (hypertension)   . Hyperlipidemia   . Stroke (HCC)   . Tobacco abuse    .5 PPD smoker    Past Surgical History:  Procedure Laterality Date  . ABDOMINAL HYSTERECTOMY    . LEFT HEART CATHETERIZATION WITH CORONARY ANGIOGRAM N/A 10/09/2014   Procedure: LEFT HEART CATHETERIZATION WITH CORONARY ANGIOGRAM;  Surgeon: Thomas A Kelly, MD;  Location: [facility]ATH [facility];  Service: Cardiovascular;  Laterality: N/A;    Current Outpatient Prescriptions  Medication Sig Dispense Refill  . ACCU-CHEK FASTCLIX LANCETS MISC Check blood sugar 3x a day as instructed dx cod 250.00 insulin requiring 102 each 5  . amLODipine (NORVASC) 5 MG tablet TAKE 1 TABLET(5 MG) BY MOUTH DAILY 90 tablet 3  . aspirin 81 MG EC tablet TAKE 1 TABLET BY MOUTH EVERY DAY (Patient taking differently: TAKE 81 MG BY MOUTH EVERY DAY) 30 tablet 0  . atorvastatin (LIPITOR) 80 MG tablet Take 1 tablet (80 mg total) by mouth daily at 6 PM. 30 tablet 2  . Blood Glucose Calibration (ACCU-CHEK   SMARTVIEW CONTROL) LIQD     . Blood Glucose Monitoring Suppl (ACCU-CHEK AVIVA PLUS) W/DEVICE KIT Use to check blood sugar 3 to 4 times daily. diag code 250.62. Insulin dependent 1 kit 0  . Chromium Picolinate 1000 MCG TABS Take 1,000 mcg by mouth daily.    . citalopram (CELEXA) 40 MG tablet Take 40 mg by mouth daily.    . Cyanocobalamin (VITAMIN B 12 PO) Take 500 mcg by mouth daily.    Marland Kitchen doxepin (SINEQUAN) 25 MG capsule Take 25 mg by mouth at bedtime.    . Dulaglutide (TRULICITY) 3.53 IR/4.4RX SOPN Inject 0.75 mg into the skin once a week. Pharmacist pls counsel patient (d/c exenatide) 4 pen 2  . DULoxetine (CYMBALTA) 60 MG capsule Take 1 capsule (60 mg total) by mouth daily. 30  capsule 1  . glucose blood (ACCU-CHEK AVIVA PLUS) test strip Use to test blood sugar 3 times daily. diag code E11.41. Insulin dependent 100 each 11  . Insulin Glargine (LANTUS SOLOSTAR) 100 UNIT/ML Solostar Pen Inject 48 Units into the skin at bedtime. 15 mL 3  . insulin lispro (HUMALOG KWIKPEN) 100 UNIT/ML KiwkPen Inject 10 units Three times daily with each meal. 15 mL 3  . Insulin Pen Needle (VALUMARK PEN NEEDLES) 31G X 8 MM MISC 1 each by Does not apply route daily. 100 each 3  . Lancet Devices (ADJUSTABLE LANCING DEVICE) MISC 1 each.     . losartan (COZAAR) 50 MG tablet TAKE 1 TABLET(50 MG) BY MOUTH TWICE DAILY 180 tablet 3  . metFORMIN (GLUCOPHAGE) 1000 MG tablet Take 1060m (1 tablets) 2 times a day. 60 tablet 3  . mirtazapine (REMERON) 30 MG tablet Take 30 mg by mouth at bedtime.     . traZODone (DESYREL) 100 MG tablet Take 300 mg by mouth at bedtime.     . vitamin C (ASCORBIC ACID) 500 MG tablet Take 500 mg by mouth daily.     No current facility-administered medications for this visit.     Allergies:   Ace inhibitors    Social History:  The patient  reports that she has been smoking Cigarettes.  She has a 12.00 pack-year smoking history. Her smokeless tobacco use includes Chew. She reports that she does not drink alcohol or use drugs.   Family History:  The patient's family history includes Aneurysm in her brother and sister; CVA in her brother, maternal grandfather, maternal grandmother, mother, and sister; Heart disease (age of onset: 669 in her father and mother; Hypertension in her mother.    ROS:  Please see the history of present illness.   Otherwise, review of systems are positive for none.   All other systems are reviewed and negative.    PHYSICAL EXAM: VS:  BP 120/86   Pulse 83   Ht 5' 7" (1.702 m)   Wt 180 lb 6.4 oz (81.8 kg)   BMI 28.25 kg/m  , BMI Body mass index is 28.25 kg/m. GENERAL:  Well appearing HEENT:  Pupils equal round and reactive, fundi not  visualized, oral mucosa unremarkable NECK:  No jugular venous distention, waveform within normal limits, carotid upstroke brisk and symmetric, no bruits, no thyromegaly LYMPHATICS:  No cervical adenopathy LUNGS:  Clear to auscultation bilaterally HEART:  RRR.  PMI not displaced or sustained,S1 and S2 within normal limits, no S3, no S4, no clicks, no rubs, no murmurs ABD:  Flat, positive bowel sounds normal in frequency in pitch, no bruits, no rebound, no guarding, no midline pulsatile mass,  no hepatomegaly, no splenomegaly EXT:  2 plus pulses throughout, no edema, no cyanosis no clubbing SKIN:  No rashes no nodules NEURO:  Cranial nerves II through XII grossly intact, motor grossly intact throughout PSYCH:  Cognitively intact, oriented to person place and time    EKG:  EKG is ordered today. The ekg ordered today demonstrates sinus rhythm rate 83 bpm.  Non-specific ST-T changes.    Echo 09/2014: LVEF 40-45% with diffuse hypokinesis and trace MR.   Nuclear stress test 09/2014: LVEF 38% with a fixed apical defect. LHC 10/2014: No obstructive coronary disease.  EF on left ventriculography was 55% and LVEDP of 10 mmHg.   Carotid Doppler 07/24/16: >50% L ECA stenosis; 1-39% L ICA stenosis  Echo 07/24/16: LVEF 55-60%.  Restrictive physiology was noted.   Recent Labs: 12/16/2015: TSH 1.680 07/23/2016: ALT 27; BUN 7; Creatinine, Ser 0.94; Hemoglobin 12.6; Platelets 247; Potassium 4.0; Sodium 130    Lipid Panel    Component Value Date/Time   CHOL 265 (H) 07/24/2016 0420   TRIG 357 (H) 07/24/2016 0420   HDL 30 (L) 07/24/2016 0420   CHOLHDL 8.8 07/24/2016 0420   VLDL 71 (H) 07/24/2016 0420   LDLCALC 164 (H) 07/24/2016 0420   LDLDIRECT 183 (H) 03/01/2014 0500      Wt Readings from Last 3 Encounters:  08/13/16 180 lb 6.4 oz (81.8 kg)  08/11/16 172 lb (78 kg)  07/23/16 175 lb 1.6 oz (79.4 kg)      ASSESSMENT AND PLAN:  # Chest pain: Susan Holmes has atypical chest pain that is  unlikely to be due to ischemia.  However, she has several risk factors including tobacco abuse, hyperlipidemia, hypertension and stroke.  Continue aspirin.  # Stroke: Susan Holmes had an ischemic stroke and did not have a TEE.  She also reports having palpitations.  We will get a TEE to evaluate for thrombus and ASD/PFO.  If this is negative we will get a 30 day event monitor.  # Hypertension: BP well-controlled.  Continue amlodipine, and losartan.  # Hyperlipidemia: Atorvastatin was started 06/2016.  We will check lipids and LFTs.    # Tobacco abuse: Susan Holmes was congratulated on her desire to quit smoking. We discussed smoking cessation for 5 minutes. I encouraged her to use the patches as planned.   Current medicines are reviewed at length with the patient today.  The patient does not have concerns regarding medicines.  The following changes have been made:  no change  Labs/ tests ordered today include:   Orders Placed This Encounter  Procedures  . Lipid panel  . Comprehensive metabolic panel  . Myocardial Perfusion Imaging  . EKG 12-Lead     Disposition:   FU with Cartez Mogle C. Oval Linsey, MD, [facility] in 1 month    This note was written with the assistance of speech recognition software.  Please excuse any transcriptional errors.  Signed, Yelena Metzer C. Oval Linsey, MD, [facility]  08/13/2016 4:22 PM     [facility]

## 2016-08-17 NOTE — CV Procedure (Signed)
    PROCEDURE NOTE:  Procedure:  Transesophageal echocardiogram Operator:  Fransico Him, MD Indications:  CVA Complications: None  During this procedure the patient is administered a total of Versed 4 mg and Fentanyl 50 mg to achieve and maintain moderate conscious sedation.  The patient's heart rate, blood pressure, and oxygen saturation are monitored continuously during the procedure. The period of conscious sedation is 15 minutes, of which I was present face-to-face 100% of this time.  Results: Normal LV size and low normal function with EF 50-55% Normal RV size and function Normal RA Normal LA and LA appendage with no thrombus Normal TV with trivial TR Normal PV with trivial PR Normal MV with mild MR Normal trileaflet AV Normal interatrial septum with no evidence of shunt by colorflow dopper or saline contrast Normal thoracic and ascending aorta.  The patient tolerated the procedure well and was transferred back to their room in stable condition.  Signed: Fransico Him, MD [facility]

## 2016-08-17 NOTE — Discharge Instructions (Signed)

## 2016-08-17 NOTE — Progress Notes (Signed)
  Echocardiogram Echocardiogram Transesophageal has been performed.  Jennette Dubin 08/17/2016, 10:59 AM

## 2016-08-17 NOTE — Interval H&P Note (Signed)
History and Physical Interval Note:  08/17/2016 9:00 AM  Susan Holmes  has presented today for surgery, with the diagnosis of chest pain  The various methods of treatment have been discussed with the patient and family. After consideration of risks, benefits and other options for treatment, the patient has consented to  Procedure(s): TRANSESOPHAGEAL ECHOCARDIOGRAM (TEE) (N/A) as a surgical intervention .  The patient's history has been reviewed, patient examined, no change in status, stable for surgery.  I have reviewed the patient's chart and labs.  Questions were answered to the patient's satisfaction.     Fransico Him

## 2016-08-18 ENCOUNTER — Other Ambulatory Visit: Payer: Self-pay | Admitting: *Deleted

## 2016-08-18 NOTE — Patient Outreach (Signed)
[facility]) Care Management  08/18/2016  Susan Holmes 04/13/1956 [id number]   Weekly transition of care call placed to member.  She report that she is doing "alright." She state that she had a TEE done yesterday and that there were no concerns voiced by the physician.  She states she now has a stress test next week, then she will have her appointment with the cardiologist.  She report that she has not had any more signs of stroke, and that her blood sugar has been decreasing.  She report her blood sugar being 150 today prior to eating.  She state she ate hot dogs last night for dinner.  Advised again about appropriate diet, she again state that she eat what she can afford.    This care manager inquired about contact with social worker, she denies any contact yet.  She is advised to await call and to return call if a voice message is left.  She verbalizes understanding.  She complains of pain in her neck/right shoulder, reports it as soreness.  She denies taking any pain medicine yet, advised to take Tylenol/Ibuprofen and to contact her PCP if the pain does not subside or get worse.    Will follow up next week with weekly transition of care calls.  Valente David, [place], MSN [place] [phone]

## 2016-08-19 ENCOUNTER — Encounter: Payer: Self-pay | Admitting: *Deleted

## 2016-08-19 ENCOUNTER — Telehealth: Payer: Self-pay | Admitting: *Deleted

## 2016-08-19 ENCOUNTER — Telehealth (HOSPITAL_COMMUNITY): Payer: Self-pay | Admitting: *Deleted

## 2016-08-19 DIAGNOSIS — R002 Palpitations: Secondary | ICD-10-CM

## 2016-08-19 DIAGNOSIS — I639 Cerebral infarction, unspecified: Secondary | ICD-10-CM

## 2016-08-19 NOTE — Telephone Encounter (Signed)
Results called to pt. Pt verbalized understanding. 30 day event monitor order placed. Message sent to scheduling for monitor and to change appt time with Dr Oval Linsey. Patient aware someone will call to schedule and to call back if no one has called her in the next day or so.

## 2016-08-19 NOTE — Telephone Encounter (Signed)
Close encounter 

## 2016-08-19 NOTE — Telephone Encounter (Signed)
-----   Message from Skeet Latch, MD sent at 08/19/2016  1:27 PM EDT ----- Echo shows a normal heart.  No blood clots and mild mitral regurgitation.  Please order 30 day event monitor for palpitations and stroke.  Reschedule follow up for 6 weeks so that we will have the monitor results back when we meet.

## 2016-08-20 ENCOUNTER — Ambulatory Visit (HOSPITAL_COMMUNITY): Admit: 2016-08-20 | Payer: Self-pay | Admitting: Cardiology

## 2016-08-20 ENCOUNTER — Encounter (HOSPITAL_COMMUNITY): Payer: Self-pay

## 2016-08-20 SURGERY — ECHOCARDIOGRAM, TRANSESOPHAGEAL
Anesthesia: Moderate Sedation

## 2016-08-23 ENCOUNTER — Encounter: Payer: Self-pay | Admitting: *Deleted

## 2016-08-23 ENCOUNTER — Other Ambulatory Visit: Payer: Self-pay | Admitting: *Deleted

## 2016-08-23 NOTE — Patient Outreach (Signed)
[facility]) Care Management  08/23/2016  VENNELA JUTTE Aug 25, 1956 [id number]   CSW was able to make initial contact with patient today to perform phone assessment, as well as assess and assist with social work needs and services.  CSW introduced self, explained role and types of services provided through [facility] ([facility]).  CSW further explained to patient that CSW works with patient's RNCM, also with [facility], Valente David. CSW then explained the reason for the call, indicating that Mrs. Orene Desanctis thought that patient would benefit from social work services and resources to assist with obtaining transportation to and from physician appointments.  CSW obtained two HIPAA compliant identifiers from patient, which included patient's name and date of birth. Patient admits that she already has [facility] Par[facility]) services arranged; however, reports that she is unable to afford the $3.00 fare (round trip) to and from her physician appointments.  CSW agreed to mail patient a 10-ride punch card for [facility], good for 5 physician appointments.  Patient was encouraged to set aside enough money each month to afford transportation through [facility], if patient does not wish to utilize [employer].  Patient reported that she was unaware that she could use Medicaid Transportation, indicating that her Medicaid Case Worker at the [facility] never told her about this benefit.  CSW explained to patient how Medicaid transportation works, informing patient that she must provide the Department of Transportation with at least a weeks notice if transportation needs to be arranged.  CSW also provided patient with the contact number.  CSW then spoke with patient about transportation with [employer], through [employer] [facility], explaining that this transportation service is free  and strictly volunteer based.  Again, patient was provided with referral information and the contact number.  Last, CSW spoke with patient about home care services, which patient admits she already receives, just wanting CSW to assist her with increasing her hours per week.  Patient went on to say that she recently applied for additional hours, but was denied.  Patient was under the impression that CSW is directly linked with [facility] Forensic scientist), and that CSW could override the recent decision.  CSW explained to patient otherwise, indicating that CSW has no authority or jurisdiction over the [facility] [place]. CSW will perform a case closure on patient, as all goals of treatment have been met from social work standpoint and no additional social work needs have been identified at this time.  CSW will notify patient's RNCM with [facility], Valente David of CSW's plans to close patient's case.  CSW will fax an update to patient's Primary Care Physician, Dr.  Shela Leff to ensure that they are aware of CSW's involvement with patient's plan of care.  CSW will submit a case closure request to Verlon Setting, Care Management Assistant with [facility], in the form of an [employer].  CSW will ensure that Mrs. Comer is aware of Roma Schanz, RNCM with [facility], continued involvement with patient's care. Nat Christen, BSW, MSW, LCSW  Licensed Education officer, [facility]  Mailing [place] [address], [place], [place] [address] Physical Address-[address], [place], [place] [address] Toll Free Main # [phone] Fax # [phone] Cell # [phone]  Fax # [phone]  [email or web address]

## 2016-08-24 ENCOUNTER — Ambulatory Visit (HOSPITAL_COMMUNITY)
Admission: RE | Admit: 2016-08-24 | Discharge: 2016-08-24 | Disposition: A | Discharge status: Home / Self Care | Payer: Medicare Other | Source: Ambulatory Visit | Attending: Internal Medicine | Admitting: Internal Medicine

## 2016-08-24 DIAGNOSIS — Z8249 Family history of ischemic heart disease and other diseases of the circulatory system: Secondary | ICD-10-CM | POA: Insufficient documentation

## 2016-08-24 DIAGNOSIS — R5383 Other fatigue: Secondary | ICD-10-CM | POA: Diagnosis not present

## 2016-08-24 DIAGNOSIS — I779 Disorder of arteries and arterioles, unspecified: Secondary | ICD-10-CM | POA: Insufficient documentation

## 2016-08-24 DIAGNOSIS — R002 Palpitations: Secondary | ICD-10-CM | POA: Diagnosis not present

## 2016-08-24 DIAGNOSIS — R0609 Other forms of dyspnea: Secondary | ICD-10-CM | POA: Diagnosis not present

## 2016-08-24 DIAGNOSIS — Z720 Tobacco use: Secondary | ICD-10-CM | POA: Insufficient documentation

## 2016-08-24 DIAGNOSIS — R079 Chest pain, unspecified: Secondary | ICD-10-CM | POA: Diagnosis not present

## 2016-08-24 LAB — MYOCARDIAL PERFUSION IMAGING
LV dias vol: 96 mL (ref 46–106)
LV sys vol: 46 mL
SDS: 0
SRS: 0
SSS: 0
TID: 1.25

## 2016-08-24 MED ORDER — TECHNETIUM TC 99M TETROFOSMIN IV KIT
30.2000 | PACK | Freq: Once | INTRAVENOUS | Status: AC | PRN
  Administered 2016-08-24: 30.2 via INTRAVENOUS
  Filled 2016-08-24: qty 30

## 2016-08-24 MED ORDER — TECHNETIUM TC 99M TETROFOSMIN IV KIT
10.3000 | PACK | Freq: Once | INTRAVENOUS | Status: AC | PRN
  Administered 2016-08-24: 10.3 via INTRAVENOUS
  Filled 2016-08-24: qty 10

## 2016-08-24 MED ORDER — REGADENOSON 0.4 MG/5ML IV SOLN
0.4000 mg | Freq: Once | INTRAVENOUS | Status: AC
  Administered 2016-08-24: 0.4 mg via INTRAVENOUS

## 2016-08-25 DIAGNOSIS — E785 Hyperlipidemia, unspecified: Secondary | ICD-10-CM | POA: Diagnosis not present

## 2016-08-26 LAB — LIPID PANEL
Cholesterol: 118 mg/dL — ABNORMAL LOW (ref 125–200)
HDL: 40 mg/dL — ABNORMAL LOW (ref >=46)
LDL Cholesterol: 53 mg/dL (ref <130)
Total CHOL/HDL Ratio: 3 Ratio (ref <=5.0)
Triglycerides: 123 mg/dL (ref <150)
VLDL: 25 mg/dL (ref <30)

## 2016-08-26 LAB — COMPREHENSIVE METABOLIC PANEL
ALT: 16 U/L (ref 6–29)
AST: 15 U/L (ref 10–35)
Albumin: 3.9 g/dL (ref 3.6–5.1)
Alkaline Phosphatase: 91 U/L (ref 33–130)
BUN: 12 mg/dL (ref 7–25)
CO2: 22 mmol/L (ref 20–31)
Calcium: 9.9 mg/dL (ref 8.6–10.4)
Chloride: 107 mmol/L (ref 98–110)
Creat: 0.93 mg/dL (ref 0.50–0.99)
Glucose, Bld: 100 mg/dL — ABNORMAL HIGH (ref 65–99)
Potassium: 4.7 mmol/L (ref 3.5–5.3)
Sodium: 140 mmol/L (ref 135–146)
Total Bilirubin: 0.3 mg/dL (ref 0.2–1.2)
Total Protein: 6.6 g/dL (ref 6.1–8.1)

## 2016-08-27 ENCOUNTER — Other Ambulatory Visit: Payer: Self-pay | Admitting: *Deleted

## 2016-08-27 NOTE — Patient Outreach (Signed)
Susan Holmes) Care Management  08/27/2016  Susan Holmes [birth year] [id number]   Weekly transition of care call placed to member.  She state that she is doing "very well."  She reports that she continue to have some right neck/shoulder pain, but still have not reported it to her PCP as advised.  She state she has taken tylenol, which se report was not effective.  She denies any numbness/tingling on the right side, just soreness.  She is advised again to contact her PCP regarding advice for treatment, and to try a heating pad for comfort.  She verbalizes understanding.    She state that she has continued to check her blood sugar regularly, unable to state a specific number for her reading this morning, stating it was "160 something."  She state that her readings continue to gradually decrease.    She confirms that she had a stress test on Tuesday, no concerns.  She has a follow up appointment with cardiology in October.  She denies any urgent concerns, will follow up next week.  Valente David, [place], MSN [place] [phone]

## 2016-09-01 ENCOUNTER — Ambulatory Visit (INDEPENDENT_AMBULATORY_CARE_PROVIDER_SITE_OTHER): Payer: Medicare Other

## 2016-09-01 ENCOUNTER — Ambulatory Visit (INDEPENDENT_AMBULATORY_CARE_PROVIDER_SITE_OTHER): Payer: Medicare Other | Admitting: Dietician

## 2016-09-01 DIAGNOSIS — Z794 Long term (current) use of insulin: Secondary | ICD-10-CM | POA: Diagnosis not present

## 2016-09-01 DIAGNOSIS — E1165 Type 2 diabetes mellitus with hyperglycemia: Secondary | ICD-10-CM | POA: Diagnosis not present

## 2016-09-01 DIAGNOSIS — I639 Cerebral infarction, unspecified: Secondary | ICD-10-CM

## 2016-09-01 DIAGNOSIS — R002 Palpitations: Secondary | ICD-10-CM

## 2016-09-01 DIAGNOSIS — Z713 Dietary counseling and surveillance: Secondary | ICD-10-CM | POA: Diagnosis not present

## 2016-09-01 DIAGNOSIS — E1140 Type 2 diabetes mellitus with diabetic neuropathy, unspecified: Secondary | ICD-10-CM

## 2016-09-01 DIAGNOSIS — IMO0002 Reserved for concepts with insufficient information to code with codable children: Secondary | ICD-10-CM

## 2016-09-01 NOTE — Progress Notes (Addendum)
Diabetes Self-Management Education  Visit Type: First/Initial(re-entered)  Last MNT was 03/17/2015 Last DSME/T was  Dec 2013 through [id number]  Appt. Start Time: 1005 Appt. End Time: [id number]  09/01/2016  Susan Holmes, identified by name and date of birth, is a 60 y.o. female with a diagnosis of Diabetes: Type 2.   ASSESSMENT  Height 5\' 7"  (1.702 m), weight 179 lb 6.4 oz (81.4 kg). Body mass index is 28.1 kg/m.   CBG done on her meter was 153 at 10:40 AM. She had taken 1 units Humalog and only had coffee at 8 AM.  Meter download shows average of 138 with range of 57-297 Patient reports that her low blood sugars came from taking insulin when she did not eat. However, the 57 was at midnight and it is likely she had eaten dinner and/or a snack before that.  Suggest decrease in meal time insulin      Diabetes Self-Management Education - 09/01/16 1100      Psychosocial Assessment   Self-management support Doctor's office;Family;CDE visits   Patient Concerns Glycemic Control   Special Needs Large print   Preferred Learning Style No preference indicated   Learning Readiness Change in progress   How often do you need to have someone help you when you read instructions, pamphlets, or other written materials from your doctor or pharmacy? 3 - Sometimes     Pre-Education Assessment   Patient understands the diabetes disease and treatment process. Demonstrates understanding / competency   Patient understands incorporating nutritional management into lifestyle. Needs Review   Patient undertands incorporating physical activity into lifestyle. Needs Review   Patient understands using medications safely. Demonstrates understanding / competency   Patient understands monitoring blood glucose, interpreting and using results Demonstrates understanding / competency   Patient understands prevention, detection, and treatment of chronic complications. Demonstrates understanding / competency   Patient  understands how to develop strategies to address psychosocial issues. Demonstrates understanding / competency   Patient understands how to develop strategies to promote health/change behavior. Needs Review     Complications   Last HgB A1C per patient/outside source 15.2 %   How often do you check your blood sugar? 3-4 times/day   Fasting Blood glucose range (mg/dL) 70-129;130-179   Postprandial Blood glucose range (mg/dL) 130-179;180-200;>200   Number of hypoglycemic episodes per month 2   Can you tell when your blood sugar is low? Yes   What do you do if your blood sugar is low? checkj sugar and eat something sweet   Number of hyperglycemic episodes per week 1   Can you tell when your blood sugar is high? Yes   What do you do if your blood sugar is high? "take insulin"     Patient Education   Nutrition management  Meal timing in regards to the patients' current diabetes medication.   Physical activity and exercise  Role of exercise on diabetes management, blood pressure control and cardiac health.   Personal strategies to promote health Lifestyle issues that need to be addressed for better diabetes care     Individualized Goals (developed by patient)   Physical Activity Exercise 3-5 times per week     Outcomes   Expected Outcomes Demonstrated interest in learning. Expect positive outcomes   Future DMSE 4-6 wks   Program Status Re-entered      Individualized Plan for Diabetes Self-Management Training:   Learning Objective:  Patient will have a greater understanding of diabetes self-management. Patient education plan is to attend  individual and/or group sessions per assessed needs and concerns.  My plan to support myself in continuing these changes to care for my diabetes is to attend or contact:    Other ?Brother and daughter provide support and [facility] has nurse and care manager calling patient at regular intervals-doctor's office, CDE, Dietitian, pharmacist, church   Plan:    Patient Instructions  Dr. Katy Fitch appointment- Due March 2017, please call them about your account [phone]. They usually allow you to make payments- consider asking.  Tuesday September 12th at 9:15 AM.    Dennis Bast are doing a great job controlling your blood sugars.   Do not take HUMALOG when you do not eat a meal.   Your goal for the next month is to get some exercise at least a few days each week ( ? Walk dog? )    Expected Outcomes:  Demonstrated interest in learning. Expect positive outcomes- anticipate A1C to be significantly improved  Education material provided: My Plate  If problems or questions, patient to contact team via:  Phone  Future DSME appointment: 4-6 wks

## 2016-09-01 NOTE — Patient Instructions (Addendum)
Dr. Katy Fitch appointment- Due March 2017, please call them about your account [phone]. They usually allow you to make payments- consider asking.  Tuesday September 12th at 9:15 AM.    Susan Holmes are doing a great job controlling your blood sugars.   Do not take HUMALOG when you do not eat a meal.   Your goal for the next month is to get exercise ( ? Walk dog? )

## 2016-09-02 ENCOUNTER — Other Ambulatory Visit: Payer: Self-pay | Admitting: *Deleted

## 2016-09-02 DIAGNOSIS — R002 Palpitations: Secondary | ICD-10-CM | POA: Diagnosis not present

## 2016-09-02 NOTE — Patient Outreach (Signed)
Susan Holmes  09/02/2016  Susan Holmes 05-31-1956 [id number]   Weekly transition of care call placed to member.  She denies any problems/concerns and state that she is doing "good."  She denies any signs/symptoms of stroke.  She confirms that she did attend her appointment with D. Plyler, dietician, and continued to receive recommendations for diabetes Holmes.  She report her blood sugar this morning being "130 something."    She is made aware that as of Monday, 9/11, her transition of care program will be complete.  Discussed further involvement with health coach for diabetes Holmes as her A1C remains elevated at 15.2 (July 2017).  Benefits of disease Holmes discussed, member denies further involvement.  She verbalizes understanding of proper diabetic diet, and of signs of stroke.  She does state that she have not received contact from Education officer, museum for community resources.  According to chart, J. Saporito, LCSW, contacted member on 8/28 and discussed community resources in great detail.  Member reminded of conversation, she denies any memory of the call.  Mrs. Saporito notified of member's concern, requested contact to again provide community resources.  Member is made aware to contact this care manager should she change her mind regarding involvement with health coach.  She is also aware of plan to close case on Monday pending no admission over the weekend.  She verbalizes understanding.  Valente David, [place], MSN [place] [phone]

## 2016-09-03 ENCOUNTER — Ambulatory Visit: Payer: Self-pay | Admitting: *Deleted

## 2016-09-06 ENCOUNTER — Other Ambulatory Visit: Payer: Self-pay | Admitting: *Deleted

## 2016-09-06 ENCOUNTER — Encounter: Payer: Self-pay | Admitting: *Deleted

## 2016-09-06 NOTE — Patient Outreach (Signed)
[facility]) Care Management  09/06/2016  Susan Holmes 17-Sep-1956 [id number]   Transition of care program complete, member has met all goals.  As discussed with member, will close case.  She does not wish to continue involvement with health coach.  LCSW, J. Saporito, updated on status.  Community resources has been provided again to member.  Will notify care management assistant and PCP of case closure.  Valente David, [place], MSN [facility] [phone]

## 2016-09-07 DIAGNOSIS — H04123 Dry eye syndrome of bilateral lacrimal glands: Secondary | ICD-10-CM | POA: Diagnosis not present

## 2016-09-07 DIAGNOSIS — Z961 Presence of intraocular lens: Secondary | ICD-10-CM | POA: Diagnosis not present

## 2016-09-07 DIAGNOSIS — H401122 Primary open-angle glaucoma, left eye, moderate stage: Secondary | ICD-10-CM | POA: Diagnosis not present

## 2016-09-07 DIAGNOSIS — H401113 Primary open-angle glaucoma, right eye, severe stage: Secondary | ICD-10-CM | POA: Diagnosis not present

## 2016-09-14 ENCOUNTER — Encounter: Payer: Self-pay | Admitting: *Deleted

## 2016-09-16 ENCOUNTER — Ambulatory Visit: Payer: Medicare Other | Admitting: Cardiovascular Disease

## 2016-09-21 ENCOUNTER — Telehealth: Payer: Self-pay | Admitting: Internal Medicine

## 2016-09-21 ENCOUNTER — Encounter: Payer: Self-pay | Admitting: Licensed Clinical Social Worker

## 2016-09-21 NOTE — Telephone Encounter (Signed)
Called Pt to ask about diabetic management and to offer help. Pt states that she is taking her medications and check her her blood sugar regularly. Her blood sugar reading this morning was 160.  pt states that she may need to change the doctor. We offer the Pt to discuss that at the next appointment which she says is scheduled for October the 31st.   Avelino Leeds, Student Nurse [employer] 09/21/2016 at 16:54

## 2016-09-29 DIAGNOSIS — H401122 Primary open-angle glaucoma, left eye, moderate stage: Secondary | ICD-10-CM | POA: Diagnosis not present

## 2016-09-29 DIAGNOSIS — H401113 Primary open-angle glaucoma, right eye, severe stage: Secondary | ICD-10-CM | POA: Diagnosis not present

## 2016-09-29 DIAGNOSIS — H04123 Dry eye syndrome of bilateral lacrimal glands: Secondary | ICD-10-CM | POA: Diagnosis not present

## 2016-09-29 DIAGNOSIS — Z961 Presence of intraocular lens: Secondary | ICD-10-CM | POA: Diagnosis not present

## 2016-10-05 ENCOUNTER — Encounter: Payer: Medicare Other | Admitting: Internal Medicine

## 2016-10-05 ENCOUNTER — Ambulatory Visit: Payer: Medicare Other | Admitting: Dietician

## 2016-10-07 ENCOUNTER — Ambulatory Visit (INDEPENDENT_AMBULATORY_CARE_PROVIDER_SITE_OTHER): Payer: Medicare Other | Admitting: Cardiovascular Disease

## 2016-10-07 ENCOUNTER — Encounter: Payer: Self-pay | Admitting: Cardiovascular Disease

## 2016-10-07 VITALS — BP 116/84 | HR 93 | Ht 67.0 in | Wt 179.6 lb

## 2016-10-07 DIAGNOSIS — E7800 Pure hypercholesterolemia, unspecified: Secondary | ICD-10-CM

## 2016-10-07 DIAGNOSIS — F329 Major depressive disorder, single episode, unspecified: Secondary | ICD-10-CM

## 2016-10-07 DIAGNOSIS — Z720 Tobacco use: Secondary | ICD-10-CM | POA: Diagnosis not present

## 2016-10-07 DIAGNOSIS — I493 Ventricular premature depolarization: Secondary | ICD-10-CM | POA: Diagnosis not present

## 2016-10-07 DIAGNOSIS — I10 Essential (primary) hypertension: Secondary | ICD-10-CM | POA: Diagnosis not present

## 2016-10-07 DIAGNOSIS — R5383 Other fatigue: Secondary | ICD-10-CM

## 2016-10-07 DIAGNOSIS — F32A Depression, unspecified: Secondary | ICD-10-CM

## 2016-10-07 MED ORDER — CARVEDILOL 12.5 MG PO TABS: 12.5000 mg | ORAL_TABLET | Freq: Two times a day (BID) | ORAL | 3 refills | Status: DC

## 2016-10-07 NOTE — Patient Instructions (Addendum)
Medication Instructions:  STOP AMLODIPINE   START CARVEDILOL 12.5 MG TWICE A DAY  Labwork: FASTING LP/TSH/FT4/CBC AT [facility] ON THE FIRST FLOOR SOON  Testing/Procedures: NONE  Follow-Up: Your physician recommends that you schedule a follow-up appointment in: 1 MONTH OV  If you need a refill on your cardiac medications before your next appointment, please call your pharmacy.

## 2016-10-07 NOTE — Progress Notes (Signed)
1    Cardiology Office Note   Date:  10/07/2016   ID:  Susan Holmes, DOB 27-Jan-1956, MRN [id number]  PCP:  Shela Leff, MD  Cardiologist:   Skeet Latch, MD   Chief Complaint  Patient presents with  . Follow-up    Monitor, pt c/o fatigue and SOB, pinching feeling in chest      History of Present Illness: Susan Holmes is a 60 y.o. female with diabetes mellitus type 2, hypertension, hyperlipidemia, carotid stenosis, stroke and tobacco abuse who presents for follow up. Susan Holmes was seen 08/13/16 with chest pain. There are time she reported atypical chest pain and was referred for [facility] 08/24/16 that was negative for ischemia. She was also noted to have a history of ischemic stroke and had not undergone TEE. She had a TEE on 08/17/16 that was negative for thrombus and did not reveal an ASD or PFO.  Carotid Doppler showed moderate L ECA stenosis and mild L ICA stenosis.  She was started on aspirin and atorvastatin 80 mg.  She did report frequent palpitations and had a 30 day event monitor placed that revealed occasional PACs and PVCs.  Susan Holmes was last seen by Dr. Stanford Breed in 10/2014.  She had an echo 09/2014 that revealed LVEF 40-45% with diffuse hypokinesis and trace MR.  She had a nuclear stress test that showed LVEF 38% with a fixed apical defect.  She had a LHC at that time without obstructive coronary disease and her EF on left ventriculography was 55% and an LVEDP of 10 mmHg.    Since her last appointment Susan Holmes has been doing OK.  She notes neck pain that alternates from the left to right side.  It has been better in the last week and is not worse with exertion. It feels like a tooth ache that lasts all day. She also notes that she feels tired and weak when she awakens in the mornings. Her legs feel weak at that time but not later in the day. She denies any claudication, chest pain, or shortness of breath. She hasn't noted any lower extremity edema,  orthopnea, or PND. She does continue to have palpitations almost daily. There is no associated lightheadedness that they are very annoying to her.  Past Medical History:  Diagnosis Date  . Abnormal stress test   . Anxiety   . Arthritis   . Congenital blindness    L eye  . Depression   . Diabetes mellitus, type II ([place])   . Glaucoma   . Hepatitis B infection pt unsure   resolved  . HTN (hypertension)   . Hyperlipidemia   . Stroke ([place])   . Tobacco abuse    .5 PPD smoker    Past Surgical History:  Procedure Laterality Date  . ABDOMINAL HYSTERECTOMY    . LEFT HEART CATHETERIZATION WITH CORONARY ANGIOGRAM N/A 10/09/2014   Procedure: LEFT HEART CATHETERIZATION WITH CORONARY ANGIOGRAM;  Surgeon: Troy Sine, MD;  Location: [facility] CATH LAB;  Service: Cardiovascular;  Laterality: N/A;  . TEE WITHOUT CARDIOVERSION N/A 08/17/2016   Procedure: TRANSESOPHAGEAL ECHOCARDIOGRAM (TEE);  Surgeon: Sueanne Margarita, MD;  Location: [facility] ENDOSCOPY;  Service: Cardiovascular;  Laterality: N/A;    Current Outpatient Prescriptions  Medication Sig Dispense Refill  . ACCU-CHEK FASTCLIX LANCETS MISC Check blood sugar 3x a day as instructed dx cod 250.00 insulin requiring 102 each 5  . aspirin 81 MG EC tablet TAKE 81 MG BY MOUTH EVERY DAY 30 tablet  0  . atorvastatin (LIPITOR) 80 MG tablet Take 1 tablet (80 mg total) by mouth daily at 6 PM. 30 tablet 2  . Blood Glucose Calibration (ACCU-CHEK SMARTVIEW CONTROL) LIQD     . Blood Glucose Monitoring Suppl (ACCU-CHEK AVIVA PLUS) W/DEVICE KIT Use to check blood sugar 3 to 4 times daily. diag code 250.62. Insulin dependent 1 kit 0  . Chromium Picolinate 1000 MCG TABS Take 1,000 mcg by mouth daily.    . citalopram (CELEXA) 40 MG tablet Take 40 mg by mouth daily.    . Cyanocobalamin (VITAMIN B 12 PO) Take 500 mcg by mouth daily.    Marland Kitchen doxepin (SINEQUAN) 25 MG capsule Take 25 mg by mouth at bedtime.    . Dulaglutide (TRULICITY) 3.30 QT/6.2UQ SOPN Inject 0.75 mg into the  skin once a week. Pharmacist pls counsel patient (d/c exenatide) 4 pen 2  . DULoxetine (CYMBALTA) 60 MG capsule Take 1 capsule (60 mg total) by mouth daily. 30 capsule 1  . glucose blood (ACCU-CHEK AVIVA PLUS) test strip Use to test blood sugar 3 times daily. diag code E11.41. Insulin dependent 100 each 11  . Insulin Glargine (LANTUS SOLOSTAR) 100 UNIT/ML Solostar Pen Inject 48 Units into the skin at bedtime. 15 mL 3  . insulin lispro (HUMALOG KWIKPEN) 100 UNIT/ML KiwkPen Inject 10 units Three times daily with each meal. 15 mL 3  . Insulin Pen Needle (VALUMARK PEN NEEDLES) 31G X 8 MM MISC 1 each by Does not apply route daily. 100 each 3  . Lancet Devices (ADJUSTABLE LANCING DEVICE) MISC 1 each.     . losartan (COZAAR) 50 MG tablet TAKE 1 TABLET(50 MG) BY MOUTH TWICE DAILY 180 tablet 3  . metFORMIN (GLUCOPHAGE) 1000 MG tablet Take 1068m (1 tablets) 2 times a day. 60 tablet 3  . mirtazapine (REMERON) 30 MG tablet Take 30 mg by mouth at bedtime.     . traZODone (DESYREL) 100 MG tablet Take 300 mg by mouth at bedtime.     . vitamin C (ASCORBIC ACID) 500 MG tablet Take 500 mg by mouth daily.    . carvedilol (COREG) 12.5 MG tablet Take 1 tablet (12.5 mg total) by mouth 2 (two) times daily. 60 tablet 3   No current facility-administered medications for this visit.     Allergies:   Ace inhibitors    Social History:  The patient  reports that she has been smoking Cigarettes.  She has a 12.00 pack-year smoking history. Her smokeless tobacco use includes Chew. She reports that she does not drink alcohol or use drugs.   Family History:  The patient's family history includes Aneurysm in her brother and sister; CVA in her brother, maternal grandfather, maternal grandmother, mother, and sister; Heart disease (age of onset: 696 in her father and mother; Hypertension in her mother.    ROS:  Please see the history of present illness.   Otherwise, review of systems are positive for none.   All other systems  are reviewed and negative.    PHYSICAL EXAM: VS:  BP 116/84   Pulse 93   Ht _0  (1.702 m)   Wt 179 lb 9.6 oz (81.5 kg)   BMI 28.13 kg/m  , BMI Body mass index is 28.13 kg/m. GENERAL:  Well appearing HEENT:  Pupils equal round and reactive, fundi not visualized, oral mucosa unremarkable NECK:  No jugular venous distention, waveform within normal limits, carotid upstroke brisk and symmetric, no bruits LYMPHATICS:  No cervical adenopathy LUNGS:  Clear to auscultation bilaterally HEART:  RRR.  PMI not displaced or sustained,S1 and S2 within normal limits, no S3, no S4, no clicks, no rubs, no murmurs ABD:  Flat, positive bowel sounds normal in frequency in pitch, no bruits, no rebound, no guarding, no midline pulsatile mass, no hepatomegaly, no splenomegaly EXT:  2 plus pulses throughout, no edema, no cyanosis no clubbing SKIN:  No rashes no nodules NEURO:  Cranial nerves II through XII grossly intact, motor grossly intact throughout PSYCH:  Cognitively intact, oriented to person place and time   EKG:  EKG is ordered today. The ekg ordered 08/13/16 demonstrates sinus rhythm rate 83 bpm.  Non-specific ST-T changes.  30 Day Event Monitor 09/01/16: Quality: Fair.  Baseline artifact. Average heart rate: 80 bpm Pauses >2.5 seconds: 0 Occasional PACs and PVCs  TEE 08/17/16: Study Conclusions  - Left ventricle: Systolic function was normal. The estimated   ejection fraction was in the range of 50% to 55%. Wall motion was   normal; there were no regional wall motion abnormalities. - Mitral valve: There was mild regurgitation. - Left atrium: No evidence of thrombus in the atrial cavity or   appendage. No evidence of thrombus in the atrial cavity or   appendage. - Right atrium: No evidence of thrombus in the atrial cavity or   appendage. - Tricuspid valve: There was trivial regurgitation. - Pulmonic valve: No evidence of vegetation. There was trivial   regurgitation.  Lexiscan  Myoview 08/24/16:  The left ventricular ejection fraction is normal (55-65%).  The computer generated EF is 52%. Visually, the EF is closer to 60% and is normal.  There was no ST segment deviation noted during stress.  The study is normal.  This is a low risk study.  Echo 09/2014: LVEF 40-45% with diffuse hypokinesis and trace MR.   Nuclear stress test 09/2014: LVEF 38% with a fixed apical defect. LHC 10/2014: No obstructive coronary disease.  EF on left ventriculography was 55% and LVEDP of 10 mmHg.   Carotid Doppler 07/24/16: >50% L ECA stenosis; 1-39% L ICA stenosis  Echo 07/24/16: LVEF 55-60%.  Restrictive physiology was noted.   Recent Labs: 12/16/2015: TSH 1.680 07/23/2016: Hemoglobin 12.6; Platelets 247 08/25/2016: ALT 16; BUN 12; Creat 0.93; Potassium 4.7; Sodium 140    Lipid Panel    Component Value Date/Time   CHOL 118 (L) 08/25/2016 1118   TRIG 123 08/25/2016 1118   HDL 40 (L) 08/25/2016 1118   CHOLHDL 3.0 08/25/2016 1118   VLDL 25 08/25/2016 1118   LDLCALC 53 08/25/2016 1118   LDLDIRECT 183 (H) 03/01/2014 0500      Wt Readings from Last 3 Encounters:  10/07/16 179 lb 9.6 oz (81.5 kg)  09/01/16 179 lb 6.4 oz (81.4 kg)  08/24/16 176 lb (79.8 kg)      ASSESSMENT AND PLAN:  # Stroke: TEE was negative for ASD/PFO.  She were 30 day event monitor that was negative for atrial fibrillation. Continue aspirin and atorvastatin.  # Hypertension: # PVCs: BP well-controlled.  However she continues to have daily palpitations and was noted to have PVCs on the monitor.  We will stop amlodipine and start carvedilol 12.5 mg bid.  # Hyperlipidemia: Atorvastatin was started 06/2016.  We will check lipids and LFTs.    # Tobacco abuse: Susan Holmes was encouraged to continue trying to quit smoking.  # Fatigue:  Susan Holmes reports fatigue and feeling tired upon awakening.  We will get a sleep study and check thyroid  function.   Current medicines are reviewed at length with  the patient today.  The patient does not have concerns regarding medicines.  The following changes have been made:  no change  Labs/ tests ordered today include:   Orders Placed This Encounter  Procedures  . T4, free  . TSH  . CBC with Differential/Platelet  . Lipid panel     Disposition:   FU with Kimberli Winne C. Oval Linsey, MD, [facility] in 1 month    This note was written with the assistance of speech recognition software.  Please excuse any transcriptional errors.  Signed, Lynnett Langlinais C. Oval Linsey, MD, [facility]  10/07/2016 5:34 PM    [place]

## 2016-10-08 ENCOUNTER — Ambulatory Visit: Payer: Medicare Other | Admitting: Cardiovascular Disease

## 2016-10-12 ENCOUNTER — Other Ambulatory Visit: Payer: Self-pay

## 2016-10-12 NOTE — Telephone Encounter (Signed)
Requesting test strip to be filled @ [facility].

## 2016-10-12 NOTE — Telephone Encounter (Signed)
Pt called / informed test strips were filled in March x 11 refills and sent to [facility]. Stated she will call the pharmacy back.

## 2016-10-20 DIAGNOSIS — H401113 Primary open-angle glaucoma, right eye, severe stage: Secondary | ICD-10-CM | POA: Diagnosis not present

## 2016-10-20 DIAGNOSIS — H401122 Primary open-angle glaucoma, left eye, moderate stage: Secondary | ICD-10-CM | POA: Diagnosis not present

## 2016-10-21 ENCOUNTER — Other Ambulatory Visit: Payer: Self-pay | Admitting: Internal Medicine

## 2016-10-23 ENCOUNTER — Other Ambulatory Visit: Payer: Self-pay | Admitting: Internal Medicine

## 2016-10-25 ENCOUNTER — Telehealth: Payer: Self-pay | Admitting: Internal Medicine

## 2016-10-25 NOTE — Telephone Encounter (Signed)
APT. REMINDER CALL, NO ANSWER, NO VOICEMAIL °

## 2016-10-26 ENCOUNTER — Ambulatory Visit: Payer: Medicare Other | Admitting: Dietician

## 2016-10-26 ENCOUNTER — Ambulatory Visit (INDEPENDENT_AMBULATORY_CARE_PROVIDER_SITE_OTHER): Payer: Medicare Other | Admitting: Internal Medicine

## 2016-10-26 VITALS — BP 128/81 | HR 73 | Temp 98.3°F | Wt 185.9 lb

## 2016-10-26 DIAGNOSIS — M25542 Pain in joints of left hand: Secondary | ICD-10-CM | POA: Diagnosis not present

## 2016-10-26 DIAGNOSIS — M25561 Pain in right knee: Secondary | ICD-10-CM

## 2016-10-26 DIAGNOSIS — E1149 Type 2 diabetes mellitus with other diabetic neurological complication: Secondary | ICD-10-CM

## 2016-10-26 DIAGNOSIS — IMO0002 Reserved for concepts with insufficient information to code with codable children: Secondary | ICD-10-CM

## 2016-10-26 DIAGNOSIS — M25562 Pain in left knee: Secondary | ICD-10-CM | POA: Diagnosis not present

## 2016-10-26 DIAGNOSIS — M25511 Pain in right shoulder: Secondary | ICD-10-CM

## 2016-10-26 DIAGNOSIS — E1165 Type 2 diabetes mellitus with hyperglycemia: Secondary | ICD-10-CM

## 2016-10-26 DIAGNOSIS — M47896 Other spondylosis, lumbar region: Secondary | ICD-10-CM

## 2016-10-26 DIAGNOSIS — I10 Essential (primary) hypertension: Secondary | ICD-10-CM

## 2016-10-26 DIAGNOSIS — Z794 Long term (current) use of insulin: Secondary | ICD-10-CM

## 2016-10-26 DIAGNOSIS — Z23 Encounter for immunization: Secondary | ICD-10-CM

## 2016-10-26 DIAGNOSIS — Z79899 Other long term (current) drug therapy: Secondary | ICD-10-CM

## 2016-10-26 DIAGNOSIS — M170 Bilateral primary osteoarthritis of knee: Secondary | ICD-10-CM

## 2016-10-26 DIAGNOSIS — G8929 Other chronic pain: Secondary | ICD-10-CM

## 2016-10-26 DIAGNOSIS — E1140 Type 2 diabetes mellitus with diabetic neuropathy, unspecified: Secondary | ICD-10-CM

## 2016-10-26 DIAGNOSIS — F17210 Nicotine dependence, cigarettes, uncomplicated: Secondary | ICD-10-CM

## 2016-10-26 DIAGNOSIS — M25541 Pain in joints of right hand: Secondary | ICD-10-CM | POA: Diagnosis not present

## 2016-10-26 DIAGNOSIS — F17200 Nicotine dependence, unspecified, uncomplicated: Secondary | ICD-10-CM

## 2016-10-26 DIAGNOSIS — Z0000 Encounter for general adult medical examination without abnormal findings: Secondary | ICD-10-CM

## 2016-10-26 LAB — POCT GLYCOSYLATED HEMOGLOBIN (HGB A1C): Hemoglobin A1C: 7.2

## 2016-10-26 LAB — GLUCOSE, CAPILLARY: Glucose-Capillary: 94 mg/dL (ref 65–99)

## 2016-10-26 MED ORDER — INSULIN GLARGINE 100 UNIT/ML SOLOSTAR PEN: 48.0000 [IU] | PEN_INJECTOR | Freq: Every day | SUBCUTANEOUS | 6 refills | Status: DC

## 2016-10-26 MED ORDER — DICLOFENAC SODIUM 1 % TD GEL: 2.0000 g | Freq: Four times a day (QID) | TRANSDERMAL | 2 refills | Status: DC

## 2016-10-26 MED ORDER — DULOXETINE HCL 60 MG PO CPEP: 60.0000 mg | ORAL_CAPSULE | Freq: Every day | ORAL | 1 refills | Status: DC

## 2016-10-26 MED ORDER — INSULIN LISPRO 100 UNIT/ML (KWIKPEN): PEN_INJECTOR | SUBCUTANEOUS | 6 refills | Status: DC

## 2016-10-26 MED ORDER — VARENICLINE TARTRATE 0.5 MG X 11 & 1 MG X 42 PO MISC: ORAL | 0 refills | Status: DC

## 2016-10-26 MED ORDER — VARENICLINE TARTRATE 1 MG PO TABS: 1.0000 mg | ORAL_TABLET | Freq: Two times a day (BID) | ORAL | 1 refills | Status: DC

## 2016-10-26 NOTE — Patient Instructions (Addendum)
Mrs. Vong was nice seeing you today.  For blood pressure:  -Continue taking losartan and carvedilol for as instructed  For diabetes:  -Use NovoLog 5 units with breakfast, 10 units with lunch, and 10 units with supper  -Continue using Lantus, Trulicity, and metformin as before  For joint pains:  -Started using Voltaren gel 4 times a day as instructed  -You have been referred to physical therapy  -I advise you to start doing water aerobic exercises to help with both joint pains and weight loss  For smoking:  -Use Chantix starter pack for the first month, then start using Chantix continuing month pack for another 2 months

## 2016-10-26 NOTE — Telephone Encounter (Signed)
Received 2nd refill request from pharmacy-pt has an appt today with pcp, will address refill at visit.Susan Holmes, Susan Apfel Cassady10/31/201710:10 AM

## 2016-10-28 NOTE — Assessment & Plan Note (Signed)
Influenza vaccine administered at this visit. 

## 2016-10-28 NOTE — Progress Notes (Signed)
   CC: Pt is c/o chronic joint pains.   HPI:  Susan Holmes is a 60 y.o. F with a PMHx of conditions listed below presenting to the clinic to discuss her chronic joint pains. HTN, DM2, and tobacco use were also discussed during this visit.Please see problem based charting for the status of the patient's current and chronic medical conditions.   Past Medical History:  Diagnosis Date  . Abnormal stress test   . Anxiety   . Arthritis   . Congenital blindness    L eye  . Depression   . Diabetes mellitus, type II ([place])   . Glaucoma   . Hepatitis B infection pt unsure   resolved  . HTN (hypertension)   . Hyperlipidemia   . Stroke ([place])   . Tobacco abuse    .5 PPD smoker    Review of Systems:  Pertinent positives mentioned in HPI. Remainder of all ROS negative.   Physical Exam:  Vitals:   10/26/16 1524  BP: 128/81  Pulse: 73  Temp: 98.3 F (36.8 C)  TempSrc: Oral  SpO2: 98%  Weight: 185 lb 14.4 oz (84.3 kg)   Physical Exam  Constitutional: She is oriented to person, place, and time. She appears well-developed and well-nourished. No distress.  HENT:  Mouth/Throat: Oropharynx is clear and moist.  Eyes: EOM are normal.  Neck: Neck supple. No tracheal deviation present.  Cardiovascular: Normal rate, regular rhythm and intact distal pulses.   Pulmonary/Chest: Effort normal and breath sounds normal. No respiratory distress.  Abdominal: Soft. Bowel sounds are normal. She exhibits no distension. There is no tenderness.  Musculoskeletal: Normal range of motion. She exhibits no edema.  R shoulder: slightly decreased ROM. No erythema, increased warmth, or swelling.  Hands: no erythema, swelling, or increased warmth of joints PIP joints. Normal ROM.  Knees: normal ROM. No erythema, increased warmth, or swelling. Crepitus felt bilaterally.   Neurological: She is alert and oriented to person, place, and time.  Skin: Skin is warm and dry.    Assessment & Plan:   See  Encounters Tab for problem based charting.  Patient discussed with Dr. Angelia Mould

## 2016-10-28 NOTE — Assessment & Plan Note (Signed)
BP Readings from Last 3 Encounters:  10/26/16 128/81  10/07/16 116/84  08/17/16 129/75    Lab Results  Component Value Date   NA 140 08/25/2016   K 4.7 08/25/2016   CREATININE 0.93 08/25/2016    Assessment: Blood pressure control:  well controlled  Comments: Currently on losartan 50 bid and carvedilol 12.5 mg bid. Was previously on Amlodipine which was stopped by cardiology on 10/07/16 and instead patient was started on carvedilol due to history of palpitations and was noted to have PVCs.   Plan: Medications:  continue current medications Educational resources provided:   Educated patient about healthy eating and exercise. Emphasized the importance of weight loss.
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
## 2016-10-28 NOTE — Assessment & Plan Note (Addendum)
A Patient is currently smoking <1/2 PPD and is interested in quitting at this time.   P -Started on Chantix starting month pack and refills for continuing month pack have been provided. Goal is to have complete smoking cessation within 12 weeks.  -Reassess compliance at next visit

## 2016-10-28 NOTE — Assessment & Plan Note (Signed)
Lab Results  Component Value Date   HGBA1C 7.2 10/26/2016   HGBA1C 15.2 (H) 07/24/2016   HGBA1C 13.4 05/04/2016     Assessment: Diabetes control:  above doal (A1c <7) Progress toward A1C goal:   improved significantly  Comments: Currently on lantus 48 qhs, novolog 10 tid, trulicity [id number] mg qweekly, and metformin 1000 mg bid. Does endorse hypoglycemic symptoms prior to lunch on a few occasions. Meter showing avg CBG 132, low 61 (before lunch), and highest 321. Patient eats one big meal around 3 pm and snacks the rest of the day.   Plan: Medications:  continue current medications except one change - Novolog 5 units with breakfast, 10 units with lunch, and 10 units with dinner.  Home glucose monitoring: Frequency:  TID Timing:  before meals  Instruction/counseling given: reminded to get eye exam, reminded to bring blood glucose meter & log to each visit, reminded to bring medications to each visit, discussed foot care, discussed the need for weight loss and discussed diet Other plans:  -Repeat A1c in 3 months

## 2016-11-01 NOTE — Progress Notes (Signed)
Internal Medicine Clinic Attending  Case discussed with Dr. Rathoreat the time of the visit. We reviewed the resident's history and exam and pertinent patient test results. I agree with the assessment, diagnosis, and plan of care documented in the resident's note.  

## 2016-11-09 ENCOUNTER — Ambulatory Visit (INDEPENDENT_AMBULATORY_CARE_PROVIDER_SITE_OTHER): Payer: Medicare Other | Admitting: Cardiovascular Disease

## 2016-11-09 ENCOUNTER — Encounter: Payer: Self-pay | Admitting: Cardiovascular Disease

## 2016-11-09 ENCOUNTER — Ambulatory Visit: Payer: Medicare Other | Attending: Internal Medicine | Admitting: Physical Therapy

## 2016-11-09 ENCOUNTER — Encounter: Payer: Self-pay | Admitting: Physical Therapy

## 2016-11-09 VITALS — BP 131/89 | HR 74 | Ht 67.0 in | Wt 181.6 lb

## 2016-11-09 DIAGNOSIS — F17210 Nicotine dependence, cigarettes, uncomplicated: Secondary | ICD-10-CM

## 2016-11-09 DIAGNOSIS — R262 Difficulty in walking, not elsewhere classified: Secondary | ICD-10-CM | POA: Insufficient documentation

## 2016-11-09 DIAGNOSIS — M25552 Pain in left hip: Secondary | ICD-10-CM | POA: Diagnosis not present

## 2016-11-09 DIAGNOSIS — G8929 Other chronic pain: Secondary | ICD-10-CM | POA: Diagnosis not present

## 2016-11-09 DIAGNOSIS — M6281 Muscle weakness (generalized): Secondary | ICD-10-CM | POA: Insufficient documentation

## 2016-11-09 DIAGNOSIS — R2689 Other abnormalities of gait and mobility: Secondary | ICD-10-CM | POA: Diagnosis not present

## 2016-11-09 DIAGNOSIS — M791 Myalgia, unspecified site: Secondary | ICD-10-CM

## 2016-11-09 DIAGNOSIS — I10 Essential (primary) hypertension: Secondary | ICD-10-CM | POA: Diagnosis not present

## 2016-11-09 DIAGNOSIS — M25562 Pain in left knee: Secondary | ICD-10-CM | POA: Insufficient documentation

## 2016-11-09 DIAGNOSIS — I493 Ventricular premature depolarization: Secondary | ICD-10-CM

## 2016-11-09 DIAGNOSIS — M25511 Pain in right shoulder: Secondary | ICD-10-CM | POA: Diagnosis not present

## 2016-11-09 DIAGNOSIS — Z720 Tobacco use: Secondary | ICD-10-CM

## 2016-11-09 DIAGNOSIS — M25561 Pain in right knee: Secondary | ICD-10-CM | POA: Diagnosis not present

## 2016-11-09 DIAGNOSIS — E7800 Pure hypercholesterolemia, unspecified: Secondary | ICD-10-CM

## 2016-11-09 NOTE — Therapy (Signed)
[address] [place], [place], [address] Phone: [phone]   Fax:  [phone]  Physical Therapy Evaluation  Patient Details  Name: Susan Holmes MRN: [id number] Date of Birth: [birth year] Referring Provider: Boykin Peek  Encounter Date: 11/09/2016      PT End of Session - 11/09/16 1442    Visit Number 1   Date for PT Re-Evaluation 01/09/17   PT Start Time 1422  22 minutes late   PT Stop Time 1445   PT Time Calculation (min) 23 min   Activity Tolerance Patient tolerated treatment well   Behavior During Therapy [facility] for tasks assessed/performed      Past Medical History:  Diagnosis Date  . Abnormal stress test   . Anxiety   . Arthritis   . Congenital blindness    L eye  . Depression   . Diabetes mellitus, type II ([place])   . Glaucoma   . Hepatitis B infection pt unsure   resolved  . HTN (hypertension)   . Hyperlipidemia   . Stroke ([place])   . Tobacco abuse    .5 PPD smoker    Past Surgical History:  Procedure Laterality Date  . ABDOMINAL HYSTERECTOMY    . LEFT HEART CATHETERIZATION WITH CORONARY ANGIOGRAM N/A 10/09/2014   Procedure: LEFT HEART CATHETERIZATION WITH CORONARY ANGIOGRAM;  Surgeon: Troy Sine, MD;  Location: [facility] CATH LAB;  Service: Cardiovascular;  Laterality: N/A;  . TEE WITHOUT CARDIOVERSION N/A 08/17/2016   Procedure: TRANSESOPHAGEAL ECHOCARDIOGRAM (TEE);  Surgeon: Sueanne Margarita, MD;  Location: [facility] ENDOSCOPY;  Service: Cardiovascular;  Laterality: N/A;    There were no vitals filed for this visit.       Subjective Assessment - 11/09/16 1424    Subjective Patient reports bilateral knee pain for a number of years, reports that over the past few motnhs pain is worse, she is unsure of a specific cause.  Reports also she has right shoulder pain.  She report that the shoulder has been hurting for about 4 years.  X-rays show OA.   Pertinent History had a stroke in July and  reports that the pain is worse since that time   Patient Stated Goals have less pain   Currently in Pain? Yes   Pain Score 6    Pain Location Knee  right shoulder   Pain Orientation Right;Left   Pain Descriptors / Indicators Aching   Pain Type Chronic pain   Pain Onset More than a month ago   Pain Frequency Constant   Aggravating Factors  being up and active pain up to 10/10   Pain Relieving Factors nothing helps, reports pain meds help a little   Effect of Pain on Daily Activities difficulty with ADL's            [facility] PT Assessment - 11/09/16 0001      Assessment   Medical Diagnosis knee pain and right shoulder pain, OA   Referring Provider Boykin Peek   Onset Date/Surgical Date 10/09/16   Hand Dominance Right   Prior Therapy in hospital for CVA in July     Precautions   Precautions None     Balance Screen   Has the patient fallen in the past 6 months No   Has the patient had a decrease in activity level because of a fear of falling?  No   Is the patient reluctant to leave their home because of a fear of falling?  No  Home Environment   Additional Comments lives alone, sometimes has a 41 year old she baby sits, has stairs, does some housework     Prior Function   Level of Independence Independent   Vocation On disability   Vocation Requirements disabled due to back issues   Leisure does not exercise     Posture/Postural Control   Posture Comments fwd head and rounded shoudlers     ROM / Strength   AROM / PROM / Strength AROM;Strength     AROM   Overall AROM Comments right shoulder AROM flexion 120, abduction 120, ER 50, IR 50 with pain in the right shoulder   AROM Assessment Site Knee   Right/Left Knee Right;Left   Right Knee Extension 10   Right Knee Flexion 105   Left Knee Extension 15   Left Knee Flexion 100     Strength   Overall Strength Comments right shoulder, bilateral knees and hips 3+/5 with some pain in the right shoulder and the knees, ankle  strength 4-/5     Flexibility   Soft Tissue Assessment /Muscle Length --  some HS and calf tightness     Palpation   Palpation comment mild crepitus in the patellas     Ambulation/Gait   Gait Comments no device, limps on the left leg     Standardized Balance Assessment   Standardized Balance Assessment Timed Up and Go Test;Berg Balance Test     Berg Balance Test   Sit to Stand Able to stand  independently using hands   Standing Unsupported Able to stand safely 2 minutes   Sitting with Back Unsupported but Feet Supported on Floor or Stool Able to sit safely and securely 2 minutes   Stand to Sit Sits safely with minimal use of hands   Transfers Able to transfer safely, minor use of hands   Standing Unsupported with Eyes Closed Able to stand 10 seconds with supervision   Standing Ubsupported with Feet Together Able to place feet together independently and stand for 1 minute with supervision   From Standing, Reach Forward with Outstretched Arm Can reach confidently >25 cm (10")   From Standing Position, Pick up Object from Floor Able to pick up shoe safely and easily   From Standing Position, Turn to Look Behind Over each Shoulder Looks behind one side only/other side shows less weight shift   Turn 360 Degrees Able to turn 360 degrees safely one side only in 4 seconds or less   Standing Unsupported, Alternately Place Feet on Step/Stool Able to stand independently and complete 8 steps >20 seconds   Standing Unsupported, One Foot in Front Able to take small step independently and hold 30 seconds   Standing on One Leg Able to lift leg independently and hold equal to or more than 3 seconds   Total Score 46     Timed Up and Go Test   Normal TUG (seconds) 19                             PT Short Term Goals - 11/09/16 1545      PT SHORT TERM GOAL #1   Title Pt will be independent with HEP    Time 8   Period Weeks   Status New           PT Long Term Goals -  11/09/16 1545      PT LONG TERM GOAL #1  Title decrease TUG time to 14 seconds   Time 8   Period Weeks   Status New     PT LONG TERM GOAL #2   Title increase Berg balance score to 49/56   Time 8   Period Weeks   Status New     PT LONG TERM GOAL #3   Title increase LE strength to 4/5   Time 8   Period Weeks   Status New     PT LONG TERM GOAL #4   Title report knee pain decreased 25%   Time 8   Period Weeks   Status New               Plan - 11/09/16 1448    Clinical Impression Statement Patient with a history of OA in the knees and right shoulder, reports recent increase of pain, she does have a history of a CVA in July that seems to coorelate some with the increase of pain.  She does not report falls but she did score 46/56 on the Berg balance   Rehab Potential Good   PT Frequency 2x / week   PT Duration 8 weeks   PT Treatment/Interventions ADLs/Self Care Home Management;Cryotherapy;Electrical Stimulation;Moist Heat;Ultrasound;Functional mobility training;Stair training;Gait training;Therapeutic activities;Therapeutic exercise;Balance training;Neuromuscular re-education;Patient/family education;Manual techniques   PT Next Visit Plan Slowly add exercises and balance, modalities as needed   Consulted and Agree with Plan of Care Patient      Patient will benefit from skilled therapeutic intervention in order to improve the following deficits and impairments:  Abnormal gait, Decreased activity tolerance, Decreased balance, Decreased endurance, Decreased mobility, Decreased strength, Decreased range of motion, Difficulty walking, Pain  Visit Diagnosis: Muscle weakness (generalized) - Plan: PT plan of care cert/re-cert  Difficulty in walking, not elsewhere classified - Plan: PT plan of care cert/re-cert  Chronic pain of right knee - Plan: PT plan of care cert/re-cert  Chronic pain of left knee - Plan: PT plan of care cert/re-cert  Chronic right shoulder pain - Plan:  PT plan of care cert/re-cert      G-Codes - [id number]    Functional Assessment Tool Used foto 63% limitation   Functional Limitation Mobility: Walking and moving around   Mobility: Walking and Moving Around Current Status [phone]) At least 60 percent but less than 80 percent impaired, limited or restricted   Mobility: Walking and Moving Around Goal Status [phone]) At least 40 percent but less than 60 percent impaired, limited or restricted       Problem List Patient Active Problem List   Diagnosis Date Noted  . Cerebrovascular accident (CVA) ([place])   . CVA (cerebrovascular accident) ([place]) 07/23/2016  . Cough 07/08/2016  . Sweating abnormality 12/16/2015  . Neck pain, musculoskeletal 11/06/2015  . Vaginal itching 10/06/2015  . Lower back pain 08/06/2015  . At risk for polypharmacy 08/06/2015  . Right hip pain 07/04/2015  . Left hip pain 06/04/2015  . Constipation 04/21/2015  . Seasonal allergies 04/21/2015  . Insomnia 12/31/2014  . Osteoarthritis 12/11/2014  . Local skin infection 12/04/2014  . Bilateral arm pain 11/19/2014  . Blurry vision 11/19/2014  . Abnormal stress test   . Chest pain 10/07/2014  . Healthcare maintenance 10/07/2014  . Assistance with transportation 09/19/2014  . Bilateral leg pain 09/05/2014  . Liver mass 04/04/2014  . Anemia, unspecified 02/05/2014  . Unspecified vitamin D deficiency 03/11/2013  . Hyperparathyroidism ([place]) 03/08/2013  . Rotator cuff syndrome of right shoulder 02/26/2012  . Type II  diabetes mellitus with neurological manifestations, uncontrolled ([place]) 06/12/2007  . Hyperlipidemia associated with type 2 diabetes mellitus ([place]) 06/12/2007  . ANXIETY 06/12/2007  . TOBACCO ABUSE 06/12/2007  . Depression 06/12/2007  . CONGENITAL BLINDNESS, LEFT EYE 06/12/2007  . Essential hypertension 06/12/2007    Sumner Boast., PT 11/09/2016, 3:55 PM  [address]  [place], [place], [address] Phone: [phone]   Fax:  [phone]  Name: LASYA POLCYN MRN: [id number] Date of Birth: 08-07-1956

## 2016-11-09 NOTE — Progress Notes (Signed)
Cardiology Office Note   Date:  11/09/2016   ID:  Susan Holmes, DOB 09-01-1956, MRN [id number]  PCP:  Shela Leff, MD  Cardiologist:   Skeet Latch, MD   Chief Complaint  Patient presents with  . Follow-up    1 month;   Marland Kitchen Shortness of Breath    occassionally.  . Dysmenorrhea    in legs     History of Present Illness: Susan Holmes is a 60 y.o. female with diabetes mellitus type 2, hypertension, hyperlipidemia, carotid stenosis, stroke and tobacco abuse who presents for follow up. Susan Holmes was seen 08/13/16 with chest pain. There are time she reported atypical chest pain and was referred for [facility] 08/24/16 that was negative for ischemia. She was also noted to have a history of ischemic stroke and had not undergone TEE. She had a TEE on 08/17/16 that was negative for thrombus and did not reveal an ASD or PFO.  Carotid Doppler showed moderate L ECA stenosis and mild L ICA stenosis.  She was started on aspirin and atorvastatin 80 mg.  She did report frequent palpitations and had a 30 day event monitor placed that revealed occasional PACs and PVCs.  Susan Holmes was last seen by Dr. Stanford Breed in 10/2014.  She had an echo 09/2014 that revealed LVEF 40-45% with diffuse hypokinesis and trace MR.  She had a nuclear stress test that showed LVEF 38% with a fixed apical defect.  She had a LHC at that time without obstructive coronary disease and her EF on left ventriculography was 55% and an LVEDP of 10 mmHg.    At her last appointment Susan Holmes was started on carvedilol and amlodipine was discontinued in order to help with  Her palpitations. She has not noted any improvement. She continues to have palpitations daily that last for 3-4 minutes at a time. There is no associated chest pain or shortness of breath. She notes that she feels anxious all the time but it is not particularly worse the time when she feels the palpitations. She tried using Chantix but it made her  nauseous. In the past she tried patches in the hospital and thinks that she might want to consider trying that again. For now she continues to smoke regularly.  She also complains of cramping in bilateral thighs. It makes it hard for her to walk at times.   Past Medical History:  Diagnosis Date  . Abnormal stress test   . Anxiety   . Arthritis   . Congenital blindness    L eye  . Depression   . Diabetes mellitus, type II ([place])   . Glaucoma   . Hepatitis B infection pt unsure   resolved  . HTN (hypertension)   . Hyperlipidemia   . Stroke ([place])   . Tobacco abuse    .5 PPD smoker    Past Surgical History:  Procedure Laterality Date  . ABDOMINAL HYSTERECTOMY    . LEFT HEART CATHETERIZATION WITH CORONARY ANGIOGRAM N/A 10/09/2014   Procedure: LEFT HEART CATHETERIZATION WITH CORONARY ANGIOGRAM;  Surgeon: Troy Sine, MD;  Location: [facility] CATH LAB;  Service: Cardiovascular;  Laterality: N/A;  . TEE WITHOUT CARDIOVERSION N/A 08/17/2016   Procedure: TRANSESOPHAGEAL ECHOCARDIOGRAM (TEE);  Surgeon: Sueanne Margarita, MD;  Location: [facility] ENDOSCOPY;  Service: Cardiovascular;  Laterality: N/A;    Current Outpatient Prescriptions  Medication Sig Dispense Refill  . ACCU-CHEK FASTCLIX LANCETS MISC Check blood sugar 3x a day as instructed dx cod  250.00 insulin requiring 102 each 5  . aspirin 81 MG EC tablet TAKE 81 MG BY MOUTH EVERY DAY 30 tablet 0  . Blood Glucose Calibration (ACCU-CHEK SMARTVIEW CONTROL) LIQD     . Blood Glucose Monitoring Suppl (ACCU-CHEK AVIVA PLUS) W/DEVICE KIT Use to check blood sugar 3 to 4 times daily. diag code 250.62. Insulin dependent 1 kit 0  . carvedilol (COREG) 12.5 MG tablet Take 1 tablet (12.5 mg total) by mouth 2 (two) times daily. 60 tablet 3  . Chromium Picolinate 1000 MCG TABS Take 1,000 mcg by mouth daily.    . citalopram (CELEXA) 40 MG tablet Take 40 mg by mouth daily.    . Cyanocobalamin (VITAMIN B 12 PO) Take 500 mcg by mouth daily.    . diclofenac sodium  (VOLTAREN) 1 % GEL Apply 2 g topically 4 (four) times daily. 100 g 2  . doxepin (SINEQUAN) 25 MG capsule Take 25 mg by mouth at bedtime.    . DULoxetine (CYMBALTA) 60 MG capsule Take 1 capsule (60 mg total) by mouth daily. 90 capsule 1  . glucose blood (ACCU-CHEK AVIVA PLUS) test strip Use to test blood sugar 3 times daily. diag code E11.41. Insulin dependent 100 each 11  . Insulin Glargine (LANTUS SOLOSTAR) 100 UNIT/ML Solostar Pen Inject 48 Units into the skin at bedtime. 15 mL 6  . insulin lispro (HUMALOG KWIKPEN) 100 UNIT/ML KiwkPen Inject 5 units with breakfast, 10 units with lunch, and 10 units with supper. 15 mL 6  . Insulin Pen Needle (VALUMARK PEN NEEDLES) 31G X 8 MM MISC 1 each by Does not apply route daily. 100 each 3  . Lancet Devices (ADJUSTABLE LANCING DEVICE) MISC 1 each.     . losartan (COZAAR) 50 MG tablet TAKE 1 TABLET(50 MG) BY MOUTH TWICE DAILY 180 tablet 3  . metFORMIN (GLUCOPHAGE) 1000 MG tablet Take 1045m (1 tablets) 2 times a day. 60 tablet 3  . TRULICITY 03.81MWE/9.9BZSOPN INJECT 0.75 MG UNDER THE SKIN ONCE A WEEK( DISCONTINUE BYETTA) 2 mL 3  . varenicline (CHANTIX STARTING MONTH PAK) 0.5 MG X 11 & 1 MG X 42 tablet Take one 0.5 mg tablet by mouth once daily for 3 days, then increase to one 0.5 mg tablet twice daily for 4 days, then increase to one 1 mg tablet twice daily. 53 tablet 0  . vitamin C (ASCORBIC ACID) 500 MG tablet Take 500 mg by mouth daily.     No current facility-administered medications for this visit.     Allergies:   Ace inhibitors; Chantix [varenicline]; and Lipitor [atorvastatin]    Social History:  The patient  reports that she has been smoking Cigarettes.  She has a 12.00 pack-year smoking history. Her smokeless tobacco use includes Chew. She reports that she does not drink alcohol or use drugs.   Family History:  The patient's family history includes Aneurysm in her brother and sister; CVA in her brother, maternal grandfather, maternal  grandmother, mother, and sister; Heart disease (age of onset: 649 in her father and mother; Hypertension in her mother.    ROS:  Please see the history of present illness.   Otherwise, review of systems are positive for none.   All other systems are reviewed and negative.    PHYSICAL EXAM: VS:  BP 131/89   Pulse 74   Ht '5\' 7"'  (1.702 m)   Wt 82.4 kg (181 lb 9.6 oz)   BMI 28.44 kg/m  , BMI Body mass index is  28.44 kg/m. GENERAL:  Well appearing HEENT:  Pupils equal round and reactive, fundi not visualized, oral mucosa unremarkable NECK:  No jugular venous distention, waveform within normal limits, carotid upstroke brisk and symmetric, no bruits LYMPHATICS:  No cervical adenopathy LUNGS:  Clear to auscultation bilaterally HEART:  RRR.  PMI not displaced or sustained,S1 and S2 within normal limits, no S3, no S4, no clicks, no rubs, no murmurs ABD:  Flat, positive bowel sounds normal in frequency in pitch, no bruits, no rebound, no guarding, no midline pulsatile mass, no hepatomegaly, no splenomegaly EXT:  2 plus pulses throughout, no edema, no cyanosis no clubbing SKIN:  No rashes no nodules NEURO:  Cranial nerves II through XII grossly intact, motor grossly intact throughout PSYCH:  Cognitively intact, oriented to person place and time   EKG:  EKG is ordered today. The ekg ordered 08/13/16 demonstrates sinus rhythm rate 83 bpm.  Non-specific ST-T changes.  30 Day Event Monitor 09/01/16: Quality: Fair.  Baseline artifact. Average heart rate: 80 bpm Pauses >2.5 seconds: 0 Occasional PACs and PVCs  TEE 08/17/16: Study Conclusions  - Left ventricle: Systolic function was normal. The estimated   ejection fraction was in the range of 50% to 55%. Wall motion was   normal; there were no regional wall motion abnormalities. - Mitral valve: There was mild regurgitation. - Left atrium: No evidence of thrombus in the atrial cavity or   appendage. No evidence of thrombus in the atrial cavity  or   appendage. - Right atrium: No evidence of thrombus in the atrial cavity or   appendage. - Tricuspid valve: There was trivial regurgitation. - Pulmonic valve: No evidence of vegetation. There was trivial   regurgitation.  Lexiscan Myoview 08/24/16:  The left ventricular ejection fraction is normal (55-65%).  The computer generated EF is 52%. Visually, the EF is closer to 60% and is normal.  There was no ST segment deviation noted during stress.  The study is normal.  This is a low risk study.  Echo 09/2014: LVEF 40-45% with diffuse hypokinesis and trace MR.   Nuclear stress test 09/2014: LVEF 38% with a fixed apical defect. LHC 10/2014: No obstructive coronary disease.  EF on left ventriculography was 55% and LVEDP of 10 mmHg.   Carotid Doppler 07/24/16: >50% L ECA stenosis; 1-39% L ICA stenosis  Echo 07/24/16: LVEF 55-60%.  Restrictive physiology was noted.   Recent Labs: 12/16/2015: TSH 1.680 07/23/2016: Hemoglobin 12.6; Platelets 247 08/25/2016: ALT 16; BUN 12; Creat 0.93; Potassium 4.7; Sodium 140    Lipid Panel    Component Value Date/Time   CHOL 118 (L) 08/25/2016 1118   TRIG 123 08/25/2016 1118   HDL 40 (L) 08/25/2016 1118   CHOLHDL 3.0 08/25/2016 1118   VLDL 25 08/25/2016 1118   LDLCALC 53 08/25/2016 1118   LDLDIRECT 183 (H) 03/01/2014 0500      Wt Readings from Last 3 Encounters:  11/09/16 82.4 kg (181 lb 9.6 oz)  10/26/16 84.3 kg (185 lb 14.4 oz)  10/07/16 81.5 kg (179 lb 9.6 oz)      ASSESSMENT AND PLAN:  # Hypertension: # PVCs: BP well-controlled.  She continues to complain of palpitations but I did not detect any on exam today and they were infrequent on her monitor. I do not think that switching her to an antiarrhythmic is warranted. I suspect that it may be more stress and anxiety related.  Continue carvedilol.    # Hyperlipidemia: # Myalgias: She notes bilateral thigh cramping  and pain that makes it hard for her to walk.  Atorvastatin was  started 06/2016.  We will stop atorvastatin and repeat lipids in 6 weeks.   # Tobacco abuse: Susan Holmes was encouraged to continue trying to quit smoking.patches have been helpful for her in the past. We discussed smoking cessation for 5 minutes.  # Stroke: TEE was negative for ASD/PFO.  She were 30 day event monitor that was negative for atrial fibrillation. Continue aspirin and atorvastatin.  Current medicines are reviewed at length with the patient today.  The patient does not have concerns regarding medicines.  The following changes have been made:  no change  Labs/ tests ordered today include:   Orders Placed This Encounter  Procedures  . Lipid panel     Disposition:   FU with Courtenay Creger C. Oval Linsey, MD, [facility] in 2 months   This note was written with the assistance of speech recognition software.  Please excuse any transcriptional errors.  Signed, Jamell Laymon C. Oval Linsey, MD, [facility]  11/09/2016 1:28 PM    [facility]

## 2016-11-09 NOTE — Patient Instructions (Signed)
Medication Instructions:  STOP ATORVASTATIN   Labwork: FASTING LIPID PANEL AT [facility] IN 2 MONTHS, FEW DAYS BEFORE YOUR FOLLOW UP   Testing/Procedures: NONE  Follow-Up: Your physician recommends that you schedule a follow-up appointment in: 2 MONTH OV  If you need a refill on your cardiac medications before your next appointment, please call your pharmacy.

## 2016-11-16 ENCOUNTER — Ambulatory Visit: Payer: Medicare Other | Admitting: Physical Therapy

## 2016-11-16 DIAGNOSIS — M25562 Pain in left knee: Secondary | ICD-10-CM

## 2016-11-16 DIAGNOSIS — M6281 Muscle weakness (generalized): Secondary | ICD-10-CM

## 2016-11-16 DIAGNOSIS — R262 Difficulty in walking, not elsewhere classified: Secondary | ICD-10-CM

## 2016-11-16 DIAGNOSIS — M25552 Pain in left hip: Secondary | ICD-10-CM | POA: Diagnosis not present

## 2016-11-16 DIAGNOSIS — M25511 Pain in right shoulder: Secondary | ICD-10-CM | POA: Diagnosis not present

## 2016-11-16 DIAGNOSIS — M25561 Pain in right knee: Secondary | ICD-10-CM | POA: Diagnosis not present

## 2016-11-16 DIAGNOSIS — R2689 Other abnormalities of gait and mobility: Secondary | ICD-10-CM

## 2016-11-16 DIAGNOSIS — G8929 Other chronic pain: Secondary | ICD-10-CM

## 2016-11-16 NOTE — Therapy (Signed)
[address] [place], [place], [address] Phone: [phone]   Fax:  [phone]  Physical Therapy Treatment  Patient Details  Name: Susan Holmes MRN: [id number] Date of Birth: 1956-08-16 Referring Provider: Boykin Peek  Encounter Date: 11/16/2016      PT End of Session - 11/16/16 1055    Visit Number 2   Date for PT Re-Evaluation 01/09/17   PT Start Time [id number]   PT Stop Time 1105   PT Time Calculation (min) 50 min   Activity Tolerance Patient tolerated treatment well;No increased pain   Behavior During Therapy WFL for tasks assessed/performed      Past Medical History:  Diagnosis Date  . Abnormal stress test   . Anxiety   . Arthritis   . Congenital blindness    L eye  . Depression   . Diabetes mellitus, type II ([place])   . Glaucoma   . Hepatitis B infection pt unsure   resolved  . HTN (hypertension)   . Hyperlipidemia   . Stroke ([place])   . Tobacco abuse    .5 PPD smoker    Past Surgical History:  Procedure Laterality Date  . ABDOMINAL HYSTERECTOMY    . LEFT HEART CATHETERIZATION WITH CORONARY ANGIOGRAM N/A 10/09/2014   Procedure: LEFT HEART CATHETERIZATION WITH CORONARY ANGIOGRAM;  Surgeon: Troy Sine, MD;  Location: [facility] CATH LAB;  Service: Cardiovascular;  Laterality: N/A;  . TEE WITHOUT CARDIOVERSION N/A 08/17/2016   Procedure: TRANSESOPHAGEAL ECHOCARDIOGRAM (TEE);  Surgeon: Sueanne Margarita, MD;  Location: [facility] ENDOSCOPY;  Service: Cardiovascular;  Laterality: N/A;    There were no vitals filed for this visit.      Subjective Assessment - 11/16/16 1031    Subjective Patient states she is doing "okay" She reports 5/10 pain.   Pertinent History had a stroke in July and reports that the pain is worse since that time   Patient Stated Goals have less pain   Currently in Pain? Yes   Pain Score 5    Pain Location Knee   Pain Orientation Right;Left;Medial   Pain Type Chronic pain   Pain  Onset More than a month ago                         [facility] Adult PT Treatment/Exercise - 11/16/16 0001      High Level Balance   High Level Balance Comments airex pad static stand/ staggered stand 4x30 secs     Exercises   Exercises Knee/Hip;Lumbar     Lumbar Exercises: Aerobic   Stationary Bike [employer] bike 5 minutes     Knee/Hip Exercises: Machines for Strengthening   Cybex Knee Extension 2x10, 10 lbs   Cybex Knee Flexion 2x10, 20 lbs     Knee/Hip Exercises: Supine   Straight Leg Raises 2 sets;10 reps;Right;Left;Strengthening   Other Supine Knee/Hip Exercises hip abduction with red tband 3x10, supine     Modalities   Modalities Electrical Stimulation;Moist Heat     Moist Heat Therapy   Number Minutes Moist Heat 15 Minutes   Moist Heat Location Knee  bilateral     Manual Therapy   Manual Therapy Passive ROM   Manual therapy comments HS, Gastroc, SKC stretch                  PT Short Term Goals - 11/09/16 1545      PT SHORT TERM GOAL #1   Title  Pt will be independent with HEP    Time 8   Period Weeks   Status New           PT Long Term Goals - 11/09/16 1545      PT LONG TERM GOAL #1   Title decrease TUG time to 14 seconds   Time 8   Period Weeks   Status New     PT LONG TERM GOAL #2   Title increase Berg balance score to 49/56   Time 8   Period Weeks   Status New     PT LONG TERM GOAL #3   Title increase LE strength to 4/5   Time 8   Period Weeks   Status New     PT LONG TERM GOAL #4   Title report knee pain decreased 25%   Time 8   Period Weeks   Status New               Plan - 11/16/16 1056    Clinical Impression Statement Patient tolerated treament well and was able to complete all exercises. Patient hamstrings and gastrocs bilaterally are tight and manual stretching focussed on these muscles. Static standing balance and staggered standing balance on airex pad was reported as "medium" difficulty.  Patient did not rpeort any increased pain with the exercises performed. After the session, patient asked if we would work on her right shoulder as well. Patient was instructed that we would incorporate this into her next trreatment.    Rehab Potential Good   PT Frequency 2x / week   PT Duration 8 weeks   PT Treatment/Interventions ADLs/Self Care Home Management;Cryotherapy;Electrical Stimulation;Moist Heat;Ultrasound;Functional mobility training;Stair training;Gait training;Therapeutic activities;Therapeutic exercise;Balance training;Neuromuscular re-education;Patient/family education;Manual techniques   PT Next Visit Plan LE strengthening, manual stretches, shoulder strengthening   Consulted and Agree with Plan of Care Patient      Patient will benefit from skilled therapeutic intervention in order to improve the following deficits and impairments:  Abnormal gait, Decreased activity tolerance, Decreased balance, Decreased endurance, Decreased mobility, Decreased strength, Decreased range of motion, Difficulty walking, Pain  Visit Diagnosis: Muscle weakness (generalized)  Difficulty in walking, not elsewhere classified  Chronic pain of right knee  Chronic pain of left knee  Chronic right shoulder pain  Left hip pain  Antalgic gait     Problem List Patient Active Problem List   Diagnosis Date Noted  . Cerebrovascular accident (CVA) ([place])   . CVA (cerebrovascular accident) ([place]) 07/23/2016  . Cough 07/08/2016  . Sweating abnormality 12/16/2015  . Neck pain, musculoskeletal 11/06/2015  . Vaginal itching 10/06/2015  . Lower back pain 08/06/2015  . At risk for polypharmacy 08/06/2015  . Right hip pain 07/04/2015  . Left hip pain 06/04/2015  . Constipation 04/21/2015  . Seasonal allergies 04/21/2015  . Insomnia 12/31/2014  . Osteoarthritis 12/11/2014  . Local skin infection 12/04/2014  . Bilateral arm pain 11/19/2014  . Blurry vision 11/19/2014  . Abnormal stress test   .  Chest pain 10/07/2014  . Healthcare maintenance 10/07/2014  . Assistance with transportation 09/19/2014  . Bilateral leg pain 09/05/2014  . Liver mass 04/04/2014  . Anemia, unspecified 02/05/2014  . Unspecified vitamin D deficiency 03/11/2013  . Hyperparathyroidism ([place]) 03/08/2013  . Rotator cuff syndrome of right shoulder 02/26/2012  . Type II diabetes mellitus with neurological manifestations, uncontrolled ([place]) 06/12/2007  . Hyperlipidemia associated with type 2 diabetes mellitus ([place]) 06/12/2007  . ANXIETY 06/12/2007  . TOBACCO ABUSE 06/12/2007  .  Depression 06/12/2007  . CONGENITAL BLINDNESS, LEFT EYE 06/12/2007  . Essential hypertension 06/12/2007    Toy Baker, SPT 11/16/2016, 11:00 AM  [address] [place], [place], [address] Phone: [phone]   Fax:  [phone]  Name: Susan Holmes MRN: [id number] Date of Birth: Apr 17, 1956

## 2016-11-23 ENCOUNTER — Ambulatory Visit: Payer: Medicare Other | Admitting: Physical Therapy

## 2016-11-25 ENCOUNTER — Ambulatory Visit: Payer: Medicare Other | Admitting: Physical Therapy

## 2016-11-25 DIAGNOSIS — H401122 Primary open-angle glaucoma, left eye, moderate stage: Secondary | ICD-10-CM | POA: Diagnosis not present

## 2016-11-25 DIAGNOSIS — H401113 Primary open-angle glaucoma, right eye, severe stage: Secondary | ICD-10-CM | POA: Diagnosis not present

## 2016-12-24 ENCOUNTER — Other Ambulatory Visit: Payer: Self-pay | Admitting: Internal Medicine

## 2017-01-04 ENCOUNTER — Ambulatory Visit (INDEPENDENT_AMBULATORY_CARE_PROVIDER_SITE_OTHER): Payer: Medicare Other | Admitting: Internal Medicine

## 2017-01-04 VITALS — BP 102/90 | HR 77 | Temp 97.5°F | Ht 67.0 in | Wt 186.6 lb

## 2017-01-04 DIAGNOSIS — D638 Anemia in other chronic diseases classified elsewhere: Secondary | ICD-10-CM

## 2017-01-04 DIAGNOSIS — K5900 Constipation, unspecified: Secondary | ICD-10-CM | POA: Diagnosis not present

## 2017-01-04 MED ORDER — SENNA 8.6 MG PO TABS: 2.0000 | ORAL_TABLET | Freq: Every day | ORAL | 0 refills | Status: DC

## 2017-01-04 MED ORDER — DOCUSATE SODIUM 100 MG PO CAPS: 100.0000 mg | ORAL_CAPSULE | Freq: Two times a day (BID) | ORAL | 0 refills | Status: DC

## 2017-01-04 MED ORDER — POLYETHYLENE GLYCOL 3350 17 GM/SCOOP PO POWD: 1.0000 | Freq: Once | ORAL | 0 refills | Status: AC

## 2017-01-04 NOTE — Progress Notes (Signed)
   CC: Constipation  HPI:  Susan Holmes is a 61 y.o. woman with a history of depression, anxiety, T2DM, mild HFrEF who is here today for constipation going on the past month. She is passing bowel movments infrequently last was 1 week ago. She passes gas on a daily basis. Colace, enema, and castor oil all offered minmal improvement. Her stools are somewhat dark when passed. Her stomach hurts all the time not particularly relieved when passing stools. There is associated nausea. She gets full early when eating early during meals but does not vomit and is able to drink fluids although decreased amounts. Her weight has not significantly decreased.  See problem based assessment and plan below for additional details  Past Medical History:  Diagnosis Date  . Abnormal stress test   . Anxiety   . Arthritis   . Congenital blindness    L eye  . Depression   . Diabetes mellitus, type II ([place])   . Glaucoma   . Hepatitis B infection pt unsure   resolved  . HTN (hypertension)   . Hyperlipidemia   . Stroke ([place])   . Tobacco abuse    .5 PPD smoker    Review of Systems:  Review of Systems  Constitutional: Negative for malaise/fatigue and weight loss.  Respiratory: Negative for shortness of breath.   Gastrointestinal: Positive for abdominal pain, constipation and nausea. Negative for blood in stool and vomiting.  Genitourinary: Negative for dysuria and hematuria.  Musculoskeletal: Negative for joint pain.  Skin: Negative for rash.    Physical Exam: Physical Exam  Constitutional: She is oriented to person, place, and time and well-developed, well-nourished, and in no distress. No distress.  HENT:  Head: Normocephalic and atraumatic.  Mouth/Throat: Oropharynx is clear and moist.  Eyes: Conjunctivae are normal.  Cardiovascular: Normal rate and regular rhythm.   Pulmonary/Chest: Effort normal and breath sounds normal.  Abdominal: Soft. Bowel sounds are normal. She exhibits no  distension. There is no tenderness.  Musculoskeletal: She exhibits no edema.  Neurological: She is alert and oriented to person, place, and time.    Vitals:   01/04/17 1445  BP: 102/90  Pulse: 77  Temp: 97.5 F (36.4 C)  TempSrc: Oral  SpO2: 100%  Weight: 186 lb 9.6 oz (84.6 kg)  Height: 5\' 7"  (1.702 m)    Assessment & Plan:   See Encounters Tab for problem based charting.  Patient discussed with Dr. Angelia Mould

## 2017-01-04 NOTE — Patient Instructions (Signed)
It was a pleasure to see you today Susan Holmes.  For her constipation I recommend taking Senokot 2 pills every night for as long as you have symptoms. Also start taking MiraLAX once daily until you have bowel movements. You can continue Colace for constipation but I think this is less important than these other 2 medicines.  We will check her blood work today and make sure there is not some underlying process that needs more urgent workup. If her constipation is not getting any better despite these treatments we may need to evaluate for diabetic gastroparesis. Because of this I like to see you in follow-up within the next few weeks.

## 2017-01-04 NOTE — Assessment & Plan Note (Addendum)
A: She has constipation which seems to be worsening of a chronic problem. I do not see obviously constipation medicines that she is on and on physical exam her abdomen is normal with normal bowel sounds. She has taken several stool softeners of laxatives but not in a particularly systematic fashion. She does not have significant peripheral neuropathy and is not losing any weight so diabetic gastroparesis seems less likely.  P: - Recommended increasing water intake - Start colace and senna scheduled daily - Miralax once daily until passing bowel movements regularly

## 2017-01-05 LAB — CBC
Hematocrit: 38.9 % (ref 34.0–46.6)
Hemoglobin: 12 g/dL (ref 11.1–15.9)
MCH: 21.9 pg — ABNORMAL LOW (ref 26.6–33.0)
MCHC: 30.8 g/dL — ABNORMAL LOW (ref 31.5–35.7)
MCV: 71 fL — ABNORMAL LOW (ref 79–97)
Platelets: 316 10*3/uL (ref 150–379)
RBC: 5.49 x10E6/uL — ABNORMAL HIGH (ref 3.77–5.28)
RDW: 17.3 % — ABNORMAL HIGH (ref 12.3–15.4)
WBC: 8.8 10*3/uL (ref 3.4–10.8)

## 2017-01-06 NOTE — Progress Notes (Signed)
Internal Medicine Clinic Attending  Case discussed with Dr. Rice at the time of the visit.  We reviewed the resident's history and exam and pertinent patient test results.  I agree with the assessment, diagnosis, and plan of care documented in the resident's note.  

## 2017-01-06 NOTE — Assessment & Plan Note (Signed)
She is feeling increasingly fatigued today. This could be due to her depression but she has also had mild anemia in the past. CBC rechecked today but showed a normal Hgb 12.0 with microcytosis that is chronic. This would be most consistent with a thalassemia trait. Her dark stools are probably not melanotic with normal Hgb and constipation.

## 2017-01-26 ENCOUNTER — Ambulatory Visit: Payer: Medicare Other | Admitting: Cardiovascular Disease

## 2017-01-31 ENCOUNTER — Telehealth: Payer: Self-pay | Admitting: Internal Medicine

## 2017-01-31 NOTE — Telephone Encounter (Signed)
APT. REMINDER CALL, LMTCB °

## 2017-02-01 ENCOUNTER — Ambulatory Visit (INDEPENDENT_AMBULATORY_CARE_PROVIDER_SITE_OTHER): Payer: Medicare Other | Admitting: Internal Medicine

## 2017-02-01 VITALS — BP 115/70 | HR 79 | Temp 98.0°F | Ht 67.0 in | Wt 188.6 lb

## 2017-02-01 DIAGNOSIS — I10 Essential (primary) hypertension: Secondary | ICD-10-CM

## 2017-02-01 DIAGNOSIS — R5383 Other fatigue: Secondary | ICD-10-CM

## 2017-02-01 DIAGNOSIS — K5900 Constipation, unspecified: Secondary | ICD-10-CM

## 2017-02-01 DIAGNOSIS — Z0000 Encounter for general adult medical examination without abnormal findings: Secondary | ICD-10-CM

## 2017-02-01 DIAGNOSIS — E1140 Type 2 diabetes mellitus with diabetic neuropathy, unspecified: Secondary | ICD-10-CM | POA: Diagnosis not present

## 2017-02-01 DIAGNOSIS — M7531 Calcific tendinitis of right shoulder: Secondary | ICD-10-CM

## 2017-02-01 DIAGNOSIS — F17210 Nicotine dependence, cigarettes, uncomplicated: Secondary | ICD-10-CM

## 2017-02-01 DIAGNOSIS — Z79899 Other long term (current) drug therapy: Secondary | ICD-10-CM

## 2017-02-01 DIAGNOSIS — I639 Cerebral infarction, unspecified: Secondary | ICD-10-CM

## 2017-02-01 DIAGNOSIS — IMO0002 Reserved for concepts with insufficient information to code with codable children: Secondary | ICD-10-CM

## 2017-02-01 DIAGNOSIS — F17200 Nicotine dependence, unspecified, uncomplicated: Secondary | ICD-10-CM

## 2017-02-01 DIAGNOSIS — M25511 Pain in right shoulder: Secondary | ICD-10-CM

## 2017-02-01 DIAGNOSIS — E1165 Type 2 diabetes mellitus with hyperglycemia: Principal | ICD-10-CM

## 2017-02-01 DIAGNOSIS — Z794 Long term (current) use of insulin: Secondary | ICD-10-CM

## 2017-02-01 DIAGNOSIS — G8929 Other chronic pain: Secondary | ICD-10-CM

## 2017-02-01 LAB — GLUCOSE, CAPILLARY: Glucose-Capillary: 97 mg/dL (ref 65–99)

## 2017-02-01 LAB — POCT GLYCOSYLATED HEMOGLOBIN (HGB A1C): Hemoglobin A1C: 7.1

## 2017-02-01 MED ORDER — DULOXETINE HCL 60 MG PO CPEP: 60.0000 mg | ORAL_CAPSULE | Freq: Every day | ORAL | 1 refills | Status: DC

## 2017-02-01 MED ORDER — SENNOSIDES-DOCUSATE SODIUM 8.6-50 MG PO TABS: 2.0000 | ORAL_TABLET | Freq: Every day | ORAL | 1 refills | Status: DC

## 2017-02-01 MED ORDER — DICLOFENAC SODIUM 1 % TD GEL: 2.0000 g | Freq: Four times a day (QID) | TRANSDERMAL | 2 refills | Status: DC

## 2017-02-01 MED ORDER — NICOTINE POLACRILEX 2 MG MT LOZG: 2.0000 mg | LOZENGE | OROMUCOSAL | 3 refills | Status: DC | PRN

## 2017-02-01 MED ORDER — NICOTINE 14 MG/24HR TD PT24: 14.0000 mg | MEDICATED_PATCH | Freq: Every day | TRANSDERMAL | 0 refills | Status: DC

## 2017-02-01 NOTE — Patient Instructions (Signed)
-  Please go for your mammogram  -Start using Nicotine patch and lozenges as instructed  -Start using Voltaren gel as instructed for your shoulder pain  -Continue using Cymbalta as instructed   -Return to the clinic in 6 weeks with your diabetes glucose meter

## 2017-02-02 ENCOUNTER — Encounter: Payer: Self-pay | Admitting: Internal Medicine

## 2017-02-02 ENCOUNTER — Other Ambulatory Visit: Payer: Self-pay | Admitting: Cardiovascular Disease

## 2017-02-02 LAB — TSH: TSH: 0.695 u[IU]/mL (ref 0.450–4.500)

## 2017-02-02 NOTE — Assessment & Plan Note (Signed)
BP Readings from Last 3 Encounters:  02/01/17 115/70  01/04/17 102/90  11/09/16 131/89    Lab Results  Component Value Date   NA 140 08/25/2016   K 4.7 08/25/2016   CREATININE 0.93 08/25/2016    Assessment: Blood pressure control:  below goal Progress toward BP goal:   stable Comments: Currently on losartan 50 mg twice daily and carvedilol 12.5 mg twice daily. Patient reports compliance with her medications.  Plan: Medications:  continue current medications Educational resources provided:   Educated patient about healthy eating and exercise. Emphasized the importance of weight loss.

## 2017-02-02 NOTE — Telephone Encounter (Signed)
Rx(s) sent to pharmacy electronically.  

## 2017-02-02 NOTE — Assessment & Plan Note (Addendum)
Lab Results  Component Value Date   HGBA1C 7.1 02/01/2017   HGBA1C 7.2 10/26/2016   HGBA1C 15.2 (H) 07/24/2016     Assessment: Diabetes control:  borderline uncontrolled Progress toward A1C goal:   improved Comments: Currently on Lantus 48 units at bedtime, NovoLog 5 units with breakfast/10 units with lunch/10 units with dinner, trulicity [id number] mg weekly, and metformin 1000 mg twice daily. Patient did not bring her meter to this visit.  Plan: Medications:  continue current medications Home glucose monitoring: Frequency:  2-3 times per day Timing:  before meals Instruction/counseling given: reminded to get eye exam, reminded to bring blood glucose meter & log to each visit, reminded to bring medications to each visit, discussed foot care, discussed the need for weight loss and discussed diet Other plans:  -Advised patient to return to the clinic within 6 weeks with her meter.  -Urine microalbumin: creatinine ratio pending.

## 2017-02-02 NOTE — Assessment & Plan Note (Signed)
Assessment Patient was previously prescribed Chantix to assist with smoking cessation. States she stopped using Chantix as it made her have nausea and vomiting. She continues to smoke less than one half pack per day. I discussed other options for smoking cessation. Patient expressed interest in starting nicotine patches.  Plan -Nicoderm 14 mg daily for 6 weeks -Nicotine 2 mg lozenges every 2 hours as needed -She has been advised to return to the clinic in 6 weeks. Will assess compliance at that visit and prescribe the next lower dose of nicotine patches.

## 2017-02-02 NOTE — Telephone Encounter (Signed)
Please review for refill. Thanks!  

## 2017-02-02 NOTE — Assessment & Plan Note (Signed)
Assessment Patient is complaining of right shoulder pain. This seems to be a chronic problem since 2013 and patient has previously tried NSAIDs. She has been to sports medicine and received steroid injections as well. He is currently on Cymbalta 60 mg daily for chronic joint/ back pain. She was previously prescribed Voltaren gel for joint/ back pain but has not been using it.  Plan -Continue Cymbalta -Advised patient to start using Voltaren gel 4 times a day -If her shoulder pain does not improve with these measures, will consider giving an intra-articular corticosteroid injection at future visit.

## 2017-02-02 NOTE — Assessment & Plan Note (Addendum)
Assessment Patient continues to complain of constipation at this visit. Reports having one bowel movement per week and occasional periumbilical abdominal pain. She is still passing flatus. Denies having any nausea or vomiting. She is able to tolerate by mouth intake well. She was previously advised start taking Colace, senna, and Miralax daily. Patient has not been using these medications regularly.  Plan -Recommended she use her medications on a daily basis -Continue Colace and Miralax -Senokot has been changed to Senokot-S -Over-the-counter Metamucil supplement -Increased dietary fiber and water intake

## 2017-02-02 NOTE — Assessment & Plan Note (Signed)
Mammogram ordered at this visit.

## 2017-02-02 NOTE — Progress Notes (Signed)
   CC: Patient is here to discuss constipation, hypertension, diabetes, tobacco use, right shoulder pain, and fatigue.  HPI:  [name] is a 61 y.o. female with a past medical history of conditions listed below presenting to the clinic to discuss constipation, hypertension, diabetes, tobacco use, right shoulder pain, and fatigue. Please see problem based charting for the status of the patient's current and chronic medical conditions.   Past Medical History:  Diagnosis Date  . Abnormal stress test   . Anxiety   . Arthritis   . Congenital blindness    L eye  . Depression   . Diabetes mellitus, type II ([place])   . Glaucoma   . Hepatitis B infection pt unsure   resolved  . HTN (hypertension)   . Hyperlipidemia   . Stroke ([place])   . Tobacco abuse    .5 PPD smoker    Review of Systems:  Pertinent positives mentioned in HPI. Remainder of all ROS negative.   Physical Exam:  Vitals:   02/01/17 1354  BP: 115/70  Pulse: 79  Temp: 98 F (36.7 C)  TempSrc: Oral  SpO2: 100%  Weight: 188 lb 9.6 oz (85.5 kg)  Height: 5\' 7"  (1.702 m)   Physical Exam  Constitutional: She is oriented to person, place, and time. She appears well-developed and well-nourished. No distress.  HENT:  Head: Normocephalic and atraumatic.  Eyes: EOM are normal.  Neck: Neck supple. No tracheal deviation present.  Cardiovascular: Normal rate, regular rhythm and intact distal pulses.  Exam reveals no gallop and no friction rub.   No murmur heard. Pulmonary/Chest: Effort normal and breath sounds normal. No respiratory distress. She has no wheezes. She has no rales.  Abdominal: Soft. Bowel sounds are normal. She exhibits no distension. There is no tenderness.  Musculoskeletal: Normal range of motion. She exhibits no edema or deformity.  Neurological: She is alert and oriented to person, place, and time.  Right shoulder: Nontender to palpation. Normal range of motion. Left shoulder: Nontender to palpation.  Normal range of motion.  Skin: Skin is warm and dry.    Assessment & Plan:   See Encounters Tab for problem based charting.  Patient discussed with Dr. Dareen Piano

## 2017-02-02 NOTE — Assessment & Plan Note (Signed)
Assessment Patient is complaining of fatigue. States she is sleeping adequately (10+ hours of sleep every night). Hemoglobin was 12 on 01/06/2017. TSH checked at this visit is normal. It is noted that patient has gained 2 pounds in the past 1 month. Reports not exercising or staying physically active.  Plan -Educated patient about healthy eating and exercise. Emphasized the importance of weight loss.

## 2017-02-03 ENCOUNTER — Encounter: Payer: Self-pay | Admitting: Cardiovascular Disease

## 2017-02-03 ENCOUNTER — Ambulatory Visit (INDEPENDENT_AMBULATORY_CARE_PROVIDER_SITE_OTHER): Payer: Medicare Other | Admitting: Cardiovascular Disease

## 2017-02-03 VITALS — BP 118/82 | HR 76 | Ht 67.0 in | Wt 189.8 lb

## 2017-02-03 DIAGNOSIS — M791 Myalgia, unspecified site: Secondary | ICD-10-CM

## 2017-02-03 DIAGNOSIS — R002 Palpitations: Secondary | ICD-10-CM

## 2017-02-03 DIAGNOSIS — R0602 Shortness of breath: Secondary | ICD-10-CM | POA: Diagnosis not present

## 2017-02-03 DIAGNOSIS — Z720 Tobacco use: Secondary | ICD-10-CM

## 2017-02-03 DIAGNOSIS — E7800 Pure hypercholesterolemia, unspecified: Secondary | ICD-10-CM

## 2017-02-03 DIAGNOSIS — I10 Essential (primary) hypertension: Secondary | ICD-10-CM

## 2017-02-03 MED ORDER — PRAVASTATIN SODIUM 10 MG PO TABS: ORAL_TABLET | ORAL | 5 refills | Status: DC

## 2017-02-03 NOTE — Progress Notes (Signed)
Cardiology Office Note   Date:  02/03/2017   ID:  Susan Holmes, DOB [birth year], MRN [id number]  PCP:  Shela Leff, MD  Cardiologist:   Skeet Latch, MD   No chief complaint on file.    History of Present Illness: Susan Holmes is a 61 y.o. female with diabetes mellitus type 2, hypertension, hyperlipidemia, carotid stenosis, stroke and tobacco abuse who presents for follow up. Susan Holmes was seen 08/13/16 with chest pain. She  was referred for Susan Holmes 08/24/16 that revealed LVEF 52% and was negative for ischemia. She was also noted to have a history of ischemic stroke and had not undergone TEE. She had a TEE on 08/17/16 that was negative for thrombus and did not reveal an ASD or PFO.  Carotid Doppler showed moderate L ECA stenosis and mild L ICA stenosis.  She was started on aspirin and atorvastatin 80 mg but this was stopped due to myalgias.  She did report frequent palpitations and had a 30 day event monitor placed that revealed occasional PACs and PVCs.  Susan Holmes previously had an echo 09/2014 that revealed LVEF 40-45% with diffuse hypokinesis and trace MR.  She had a nuclear stress test that showed LVEF 38% with a fixed apical defect.  She had a LHC at that time without obstructive coronary disease and her EF on left ventriculography was 55% and an LVEDP of 10 mmHg.    Since her last appointment Susan Holmes has been feeling tired and short of breath.  She notes exertional dyspnea but denies chest pain, lower extremity edema, orthopnea or PND.  She continues to smoke less than 1 ppd.  She previously tried Chantix but had nausea.  Her myalgias improved after stopping atorvastatin.  She continues to have palpitations but they are better than in the past.   Past Medical History:  Diagnosis Date  . Abnormal stress test   . Anxiety   . Arthritis   . Congenital blindness    L eye  . Depression   . Diabetes mellitus, type II ([place])   . Glaucoma   . Hepatitis B  infection pt unsure   resolved  . HTN (hypertension)   . Hyperlipidemia   . Stroke ([place])   . Tobacco abuse    .5 PPD smoker    Past Surgical History:  Procedure Laterality Date  . ABDOMINAL HYSTERECTOMY    . LEFT HEART CATHETERIZATION WITH CORONARY ANGIOGRAM N/A 10/09/2014   Procedure: LEFT HEART CATHETERIZATION WITH CORONARY ANGIOGRAM;  Surgeon: Troy Sine, MD;  Location: [facility] CATH LAB;  Service: Cardiovascular;  Laterality: N/A;  . TEE WITHOUT CARDIOVERSION N/A 08/17/2016   Procedure: TRANSESOPHAGEAL ECHOCARDIOGRAM (TEE);  Surgeon: Sueanne Margarita, MD;  Location[facility] ENDOSCOPY;  Service: Cardiovascular;  Laterality: N/A;    Current Outpatient Prescriptions  Medication Sig Dispense Refill  . ACCU-CHEK FASTCLIX LANCETS MISC Check blood sugar 3x a day as instructed dx cod 250.00 insulin requiring 102 each 5  . aspirin 81 MG EC tablet TAKE 81 MG BY MOUTH EVERY DAY 30 tablet 0  . Blood Glucose Calibration (ACCU-CHEK SMARTVIEW CONTROL) LIQD     . Blood Glucose Monitoring Suppl (ACCU-CHEK AVIVA PLUS) W/DEVICE KIT Use to check blood sugar 3 to 4 times daily. diag code 250.62. Insulin dependent 1 kit 0  . carvedilol (COREG) 12.5 MG tablet TAKE 1 TABLET(12.5 MG) BY MOUTH TWICE DAILY 60 tablet 6  . diclofenac sodium (VOLTAREN) 1 % GEL Apply 2 g topically 4 (  four) times daily. 100 g 2  . docusate sodium (COLACE) 100 MG capsule Take 1 capsule (100 mg total) by mouth 2 (two) times daily. 10 capsule 0  . doxepin (SINEQUAN) 25 MG capsule Take 25 mg by mouth at bedtime.    . DULoxetine (CYMBALTA) 60 MG capsule Take 1 capsule (60 mg total) by mouth daily. 90 capsule 1  . glucose blood (ACCU-CHEK AVIVA PLUS) test strip Use to test blood sugar 3 times daily. diag code E11.41. Insulin dependent 100 each 11  . Insulin Glargine (LANTUS SOLOSTAR) 100 UNIT/ML Solostar Pen Inject 48 Units into the skin at bedtime. 15 mL 6  . insulin lispro (HUMALOG KWIKPEN) 100 UNIT/ML KiwkPen Inject 5 units with breakfast, 10  units with lunch, and 10 units with supper. 15 mL 6  . Insulin Pen Needle (VALUMARK PEN NEEDLES) 31G X 8 MM MISC 1 each by Does not apply route daily. 100 each 3  . Lancet Devices (ADJUSTABLE LANCING DEVICE) MISC 1 each.     . losartan (COZAAR) 50 MG tablet TAKE 1 TABLET(50 MG) BY MOUTH TWICE DAILY 180 tablet 3  . metFORMIN (GLUCOPHAGE) 1000 MG tablet Take 1030m (1 tablets) 2 times a day. 60 tablet 3  . mirtazapine (REMERON) 15 MG tablet Take 1 tablet by mouth daily.    . nicotine (NICODERM CQ - DOSED IN MG/24 HOURS) 14 mg/24hr patch Place 1 patch (14 mg total) onto the skin daily. 42 patch 0  . nicotine polacrilex (NICORETTE) 2 MG lozenge Take 1 lozenge (2 mg total) by mouth every 2 (two) hours as needed for smoking cessation. 100 tablet 3  . senna-docusate (SENOKOT-S) 8.6-50 MG tablet Take 2 tablets by mouth at bedtime. 60 tablet 1  . timolol (TIMOPTIC) 0.5 % ophthalmic solution Place 1 drop into both eyes as directed.    . traZODone (DESYREL) 100 MG tablet Take 1 tablet by mouth daily.    . TRULICITY 00.10MXN/2.3FTSOPN INJECT 0.75 MG UNDER THE SKIN ONCE A WEEK( DISCONTINUE BYETTA) 2 mL 3  . vitamin C (ASCORBIC ACID) 500 MG tablet Take 500 mg by mouth daily.    . pravastatin (PRAVACHOL) 10 MG tablet 1/2 TABLET BY  MOUTH DAILY 15 tablet 5   No current facility-administered medications for this visit.     Allergies:   Ace inhibitors; Chantix [varenicline]; and Lipitor [atorvastatin]    Social History:  The patient  reports that she has been smoking Cigarettes.  She has a 12.00 pack-year smoking history. Her smokeless tobacco use includes Chew. She reports that she does not drink alcohol or use drugs.   Family History:  The patient's family history includes Aneurysm in her brother and sister; CVA in her brother, maternal grandfather, maternal grandmother, mother, and sister; Heart disease (age of onset: 630 in her father and mother; Hypertension in her mother.    ROS:  Please see the  history of present illness.   Otherwise, review of systems are positive for none.   All other systems are reviewed and negative.    PHYSICAL EXAM: VS:  BP 118/82   Pulse 76   Ht '5\' 7"'  (1.702 m)   Wt 86.1 kg (189 lb 12.8 oz)   BMI 29.73 kg/m  , BMI Body mass index is 29.73 kg/m. GENERAL:  Well appearing HEENT:  Pupils equal round and reactive, fundi not visualized, oral mucosa unremarkable NECK:  No jugular venous distention, waveform within normal limits, carotid upstroke brisk and symmetric, no bruits LYMPHATICS:  No  cervical adenopathy LUNGS:  Clear to auscultation bilaterally HEART:  RRR.  PMI not displaced or sustained,S1 and S2 within normal limits, no S3, no S4, no clicks, no rubs, no murmurs ABD:  Flat, positive bowel sounds normal in frequency in pitch, no bruits, no rebound, no guarding, no midline pulsatile mass, no hepatomegaly, no splenomegaly EXT:  2 plus pulses throughout, no edema, no cyanosis no clubbing SKIN:  No rashes no nodules NEURO:  Cranial nerves II through XII grossly intact, motor grossly intact throughout PSYCH:  Cognitively intact, oriented to person place and time   EKG:  EKG is not ordered today. The ekg ordered 08/13/16 demonstrates sinus rhythm rate 83 bpm.  Non-specific ST-T changes. Sinus rhythm. Rate 76 bpm.  30 Day Event Monitor 09/01/16: Quality: Fair.  Baseline artifact. Average heart rate: 80 bpm Pauses >2.5 seconds: 0 Occasional PACs and PVCs  TEE 08/17/16: Study Conclusions  - Left ventricle: Systolic function was normal. The estimated   ejection fraction was in the range of 50% to 55%. Wall motion was   normal; there were no regional wall motion abnormalities. - Mitral valve: There was mild regurgitation. - Left atrium: No evidence of thrombus in the atrial cavity or   appendage. No evidence of thrombus in the atrial cavity or   appendage. - Right atrium: No evidence of thrombus in the atrial cavity or   appendage. - Tricuspid  valve: There was trivial regurgitation. - Pulmonic valve: No evidence of vegetation. There was trivial   regurgitation.  Lexiscan Myoview 08/24/16:  The left ventricular ejection fraction is normal (55-65%).  The computer generated EF is 52%. Visually, the EF is closer to 60% and is normal.  There was no ST segment deviation noted during stress.  The study is normal.  This is a low risk study.  Echo 09/2014: LVEF 40-45% with diffuse hypokinesis and trace MR.   Nuclear stress test 09/2014: LVEF 38% with a fixed apical defect. LHC 10/2014: No obstructive coronary disease.  EF on left ventriculography was 55% and LVEDP of 10 mmHg.    Carotid Doppler 07/24/16: >50% L ECA stenosis; 1-39% L ICA stenosis  Echo 07/24/16: LVEF 55-60%.  Restrictive physiology was noted.   Recent Labs: 07/23/2016: Hemoglobin 12.6 08/25/2016: ALT 16; BUN 12; Creat 0.93; Potassium 4.7; Sodium 140 01/04/2017: Platelets 316 02/01/2017: TSH 0.695    Lipid Panel    Component Value Date/Time   CHOL 118 (L) 08/25/2016 1118   TRIG 123 08/25/2016 1118   HDL 40 (L) 08/25/2016 1118   CHOLHDL 3.0 08/25/2016 1118   VLDL 25 08/25/2016 1118   LDLCALC 53 08/25/2016 1118   LDLDIRECT 183 (H) 03/01/2014 0500      Wt Readings from Last 3 Encounters:  02/03/17 86.1 kg (189 lb 12.8 oz)  02/01/17 85.5 kg (188 lb 9.6 oz)  01/04/17 84.6 kg (186 lb 9.6 oz)      ASSESSMENT AND PLAN:  # Hypertension: # PVCs: BP well-controlled.  Continue carvedilol.    # Hyperlipidemia: # Myalgias: Ms. Tilley didn't tolerate atorvastatin 2/2 myalgias.  We will start pravastatin 10 mg daily.  Repeat lipids and CMP in 6 weeks.  # Tobacco abuse: Ms. Warda was encouraged to continue trying to quit smoking.patches have been helpful for her in the past. We discussed smoking cessation for 5 minutes.  # Stroke:  # Carotid stenosis: Continue aspirin and pravavastatin.  # Shortness of breath: Ms. Frees continues to report shortness of  breath.  She is euvolemic and  stress was negative for ischemia.  She continues to smoke.  Will refer to pulmonology for evaluation.     Current medicines are reviewed at length with the patient today.  The patient does not have concerns regarding medicines.  The following changes have been made:  no change  Labs/ tests ordered today include:   Orders Placed This Encounter  Procedures  . Lipid panel  . Comprehensive metabolic panel  . Ambulatory referral to Pulmonology  . EKG 12-Lead     Disposition:   FU with Shaune Westfall C. Oval Linsey, MD, [facility] in 4 months   This note was written with the assistance of speech recognition software.  Please excuse any transcriptional errors.  Signed, Fulton Merry C. Oval Linsey, MD, [facility]  02/03/2017 10:06 PM    [place]

## 2017-02-03 NOTE — Patient Instructions (Addendum)
Medication Instructions:  START PRAVACHOL 5 MG AT BEDTIME  Labwork: FASTING LIPID/CMET  PANEL AT [facility] IN 6 WEEKS   Testing/Procedures: NONE  Follow-Up: Your physician recommends that you schedule a follow-up appointment in: 4 MONTH OV  Any Other Special Instructions Will Be Listed Below (If Applicable). You have been referred to PULMONARY FOR YOUR SHORTNESS OF BREATH. IF YOU DO NOT HEAR FROM THEM BY MID NEXT WEEK YOU MAY CALL THEM DIRECTLY AT [phone]  If you need a refill on your cardiac medications before your next appointment, please call your pharmacy.

## 2017-02-04 NOTE — Progress Notes (Signed)
Internal Medicine Clinic Attending  Case discussed with Dr. Rathoreat the time of the visit. We reviewed the resident's history and exam and pertinent patient test results. I agree with the assessment, diagnosis, and plan of care documented in the resident's note.  

## 2017-02-09 ENCOUNTER — Other Ambulatory Visit: Payer: Self-pay | Admitting: Internal Medicine

## 2017-02-16 DIAGNOSIS — H401113 Primary open-angle glaucoma, right eye, severe stage: Secondary | ICD-10-CM | POA: Diagnosis not present

## 2017-02-16 DIAGNOSIS — H401122 Primary open-angle glaucoma, left eye, moderate stage: Secondary | ICD-10-CM | POA: Diagnosis not present

## 2017-02-21 DIAGNOSIS — Z86010 Personal history of colonic polyps: Secondary | ICD-10-CM | POA: Diagnosis not present

## 2017-02-21 DIAGNOSIS — K5900 Constipation, unspecified: Secondary | ICD-10-CM | POA: Diagnosis not present

## 2017-03-01 ENCOUNTER — Other Ambulatory Visit: Payer: Self-pay | Admitting: Internal Medicine

## 2017-03-08 ENCOUNTER — Encounter: Payer: Self-pay | Admitting: Internal Medicine

## 2017-03-08 ENCOUNTER — Encounter: Payer: Medicare Other | Admitting: Internal Medicine

## 2017-03-10 ENCOUNTER — Ambulatory Visit (INDEPENDENT_AMBULATORY_CARE_PROVIDER_SITE_OTHER): Payer: Medicare Other | Admitting: Pulmonary Disease

## 2017-03-10 ENCOUNTER — Encounter: Payer: Self-pay | Admitting: Pulmonary Disease

## 2017-03-10 ENCOUNTER — Ambulatory Visit (INDEPENDENT_AMBULATORY_CARE_PROVIDER_SITE_OTHER)
Admission: RE | Admit: 2017-03-10 | Discharge: 2017-03-10 | Disposition: A | Discharge status: Home / Self Care | Payer: Medicare Other | Source: Ambulatory Visit | Attending: Pulmonary Disease | Admitting: Pulmonary Disease

## 2017-03-10 VITALS — BP 120/72 | HR 74 | Ht 67.0 in | Wt 192.2 lb

## 2017-03-10 DIAGNOSIS — R0602 Shortness of breath: Secondary | ICD-10-CM

## 2017-03-10 DIAGNOSIS — F17210 Nicotine dependence, cigarettes, uncomplicated: Secondary | ICD-10-CM

## 2017-03-10 DIAGNOSIS — R05 Cough: Secondary | ICD-10-CM | POA: Diagnosis not present

## 2017-03-10 NOTE — Patient Instructions (Signed)
We will schedule for pulmonary function tests and chest x-ray Order Nicoderm 14 mg daily for 6 weeks Nicotine 2 mg lozenges every 2 hours as needed  Return in 1 month

## 2017-03-10 NOTE — Progress Notes (Signed)
Susan Holmes    [id number]    November 13, 1956  Primary Care Physician:Vasundhra Marlowe Sax, MD  Referring Physician: Shela Leff, MD [address] [place],  [id number]  Chief complaint: Consult for evaluation of shortness of breath  HPI: 61 year old with past medical history of hypertension, hyperlipidemia, stroke, diabetes, hepatitis B. with complaints of progressive dyspnea on exertion for the past couple of years. She denies dyspnea at rest, cough, mucus production, wheezing. She is an active smoker and had smoked about three fourth pack for 20 years. She continues to smoke about quarter pack per day.. She had tried to quit in the past but could not tolerate Chantix. 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 She does not report any exposures at work or at home.   Outpatient Encounter Prescriptions as of 03/10/2017  Medication Sig  . ACCU-CHEK FASTCLIX LANCETS MISC Check blood sugar 3x a day as instructed dx cod 250.00 insulin requiring  . aspirin 81 MG EC tablet TAKE 81 MG BY MOUTH EVERY DAY  . Blood Glucose Calibration (ACCU-CHEK SMARTVIEW CONTROL) LIQD   . Blood Glucose Monitoring Suppl (ACCU-CHEK AVIVA PLUS) W/DEVICE KIT Use to check blood sugar 3 to 4 times daily. diag code 250.62. Insulin dependent  . carvedilol (COREG) 12.5 MG tablet TAKE 1 TABLET(12.5 MG) BY MOUTH TWICE DAILY  . diclofenac sodium (VOLTAREN) 1 % GEL Apply 2 g topically 4 (four) times daily.  Marland Kitchen docusate sodium (COLACE) 100 MG capsule Take 1 capsule (100 mg total) by mouth 2 (two) times daily.  Marland Kitchen doxepin (SINEQUAN) 25 MG capsule Take 25 mg by mouth at bedtime.  . DULoxetine (CYMBALTA) 60 MG capsule Take 1 capsule (60 mg total) by mouth daily.  Marland Kitchen glucose blood (ACCU-CHEK AVIVA PLUS) test strip Use to test blood sugar 3 times daily. diag code E11.41. Insulin dependent  . Insulin Glargine (LANTUS SOLOSTAR) 100 UNIT/ML Solostar Pen Inject 48 Units into the  skin at bedtime.  . insulin lispro (HUMALOG KWIKPEN) 100 UNIT/ML KiwkPen Inject 5 units with breakfast, 10 units with lunch, and 10 units with supper.  . Insulin Pen Needle (VALUMARK PEN NEEDLES) 31G X 8 MM MISC 1 each by Does not apply route daily.  Elmore Guise Devices (ADJUSTABLE LANCING DEVICE) MISC 1 each.   . losartan (COZAAR) 50 MG tablet TAKE 1 TABLET(50 MG) BY MOUTH TWICE DAILY  . metFORMIN (GLUCOPHAGE) 1000 MG tablet TAKE 1 TABLET BY MOUTH TWICE DAILY  . mirtazapine (REMERON) 15 MG tablet Take 1 tablet by mouth daily.  . pravastatin (PRAVACHOL) 10 MG tablet 1/2 TABLET BY  MOUTH DAILY  . senna-docusate (SENOKOT-S) 8.6-50 MG tablet Take 2 tablets by mouth at bedtime.  . timolol (TIMOPTIC) 0.5 % ophthalmic solution Place 1 drop into both eyes as directed.  . traZODone (DESYREL) 100 MG tablet Take 1 tablet by mouth daily.  . TRULICITY 7.84 ON/6.2XB SOPN INJECT 0.75 MG UNDER THE SKIN ONCE A WEEK( DISCONTINUE BYETTA)  . [DISCONTINUED] nicotine (NICODERM CQ - DOSED IN MG/24 HOURS) 14 mg/24hr patch Place 1 patch (14 mg total) onto the skin daily.  . [DISCONTINUED] nicotine polacrilex (NICORETTE) 2 MG lozenge Take 1 lozenge (2 mg total) by mouth every 2 (two) hours as needed for smoking cessation.  . [DISCONTINUED] vitamin C (ASCORBIC ACID) 500 MG tablet Take 500 mg by mouth daily.   No facility-administered encounter medications on file as of 03/10/2017.  Allergies as of 03/10/2017 - Review Complete 03/10/2017  Allergen Reaction Noted  . Ace inhibitors Cough 05/01/2012  . Chantix [varenicline] Nausea And Vomiting 11/09/2016  . Lipitor [atorvastatin]  11/09/2016    Past Medical History:  Diagnosis Date  . Abnormal stress test   . Anxiety   . Arthritis   . Congenital blindness    L eye  . Depression   . Diabetes mellitus, type II ([place])   . Glaucoma   . Hepatitis B infection pt unsure   resolved  . HTN (hypertension)   . Hyperlipidemia   . Stroke ([place])   . Tobacco abuse    .5  PPD smoker    Past Surgical History:  Procedure Laterality Date  . ABDOMINAL HYSTERECTOMY    . LEFT HEART CATHETERIZATION WITH CORONARY ANGIOGRAM N/A 10/09/2014   Procedure: LEFT HEART CATHETERIZATION WITH CORONARY ANGIOGRAM;  Surgeon: Troy Sine, MD;  Location[facility] C[facility] LAB;  Service: Cardiovascular;  Laterality: N/A;  . TEE WITHOUT CARDIOVERSION N/A 08/17/2016   Procedure: TRANSESOPHAGEAL ECHOCARDIOGRAM (TEE);  Surgeon: Sueanne Margarita, MD;  Location: [facility] ENDOSCOPY;  Service: Cardiovascular;  Laterality: N/A;    Family History  Problem Relation Age of Onset  . Heart disease Mother 25  . Hypertension Mother   . CVA Mother   . Heart disease Father 32  . CVA Sister   . CVA Brother   . Aneurysm Brother   . Aneurysm Sister   . CVA Maternal Grandmother   . CVA Maternal Grandfather   . Colon cancer Neg Hx   . Esophageal cancer Neg Hx   . Stomach cancer Neg Hx     Social History   Social History  . Marital status: Divorced    Spouse name: N/A  . Number of children: 1  . Years of education: 53   Occupational History  .  Unemployed   Social History Main Topics  . Smoking status: Current Every Day Smoker    Packs/day: 0.25    Years: 20.00    Types: Cigarettes  . Smokeless tobacco: Current User    Types: Chew  . Alcohol use No  . Drug use: No  . Sexual activity: Not Currently    Birth control/ protection: Surgical   Other Topics Concern  . Not on file   Social History Narrative  . No narrative on file    Review of systems: Review of Systems  Constitutional: Negative for fever and chills.  HENT: Negative.   Eyes: Negative for blurred vision.  Respiratory: as per HPI  Cardiovascular: Negative for chest pain and palpitations.  Gastrointestinal: Negative for vomiting, diarrhea, blood per rectum. Genitourinary: Negative for dysuria, urgency, frequency and hematuria.  Musculoskeletal: Negative for myalgias, back pain and joint pain.  Skin: Negative for itching and  rash.  Neurological: Negative for dizziness, tremors, focal weakness, seizures and loss of consciousness.  Endo/Heme/Allergies: Negative for environmental allergies.  Psychiatric/Behavioral: Negative for depression, suicidal ideas and hallucinations.  All other systems reviewed and are negative.  Physical Exam: Blood pressure 120/72, pulse 74, height '5\' 7"'  (1.702 m), weight 192 lb 3.2 oz (87.2 kg), SpO2 97 %. Gen:      No acute distress HEENT:  EOMI, sclera anicteric Neck:     No masses; no thyromegaly Lungs:    Clear to auscultation bilaterally; normal respiratory effort CV:         Regular rate and rhythm; no murmurs Abd:      + bowel sounds; soft, non-tender; no  palpable masses, no distension Ext:    No edema; adequate peripheral perfusion Skin:      Warm and dry; no rash Neuro: alert and oriented x 3 Psych: normal mood and affect  Data Reviewed: Echo 07/24/16 Left ventricle: The cavity size was normal. Systolic function was   normal. The estimated ejection fraction was in the range of 55%   to 60%. Wall motion was normal; there were no regional wall   motion abnormalities. Doppler parameters are consistent with   restrictive physiology, indicative of decreased left ventricular   diastolic compliance and/or increased left atrial pressure.  PFTs 10/01/14 FVC  1.79 (59%0 FEV1 1.44 [60%) F/F 80 Moderate obstructive airway disease  CT chest 10/31/13- no lung abnormality Chest x-ray 06/27/16-no active cardiopulmonary process Chest x-ray 2/6-1 7-no acute cardiopulmonary abnormality All images personally reviewed.  Assessment:  Dyspnea on exertion  Mrs Gasner likely has COPD based on her smoking history and prior PFTs. I'll evaluate by repeating a chest x-ray and pulmonary function tests for more recent assessment. She may may benefit from inhaler therapy but prefers to wait to discuss this at her next visit.  Active smoker She wants to quit but she could not tolerate chantix. Her  primary care and had given a nicotine replacement therapy but she lost the prescription. We will reorder this. Time spent counseling- 5 mins.   Plan/Recommendations: - CXR, PFTs - Smoking cessation with nicotine replacement.  Marshell Garfinkel MD  Pulmonary and Critical Care Pager [phone] 03/10/2017, 11:29 AM  CC: Shela Leff, MD

## 2017-03-12 ENCOUNTER — Other Ambulatory Visit: Payer: Self-pay | Admitting: Internal Medicine

## 2017-03-15 MED ORDER — INSULIN LISPRO 100 UNIT/ML (KWIKPEN): PEN_INJECTOR | SUBCUTANEOUS | 6 refills | Status: DC

## 2017-03-16 NOTE — Progress Notes (Signed)
Cardiology Office Note   Date:  03/17/2017   ID:  Susan Holmes, DOB May 13, 1956, MRN [id number]  PCP:  Shela Leff, MD  Cardiologist:   Skeet Latch, MD   Chief Complaint  Patient presents with  . Shortness of Breath    pt states some when walking and sitting down   . Follow-up    states some dizziness every once and a while   . Chest Pain    every now and then      History of Present Illness: Susan Holmes is a 61 y.o. female with diabetes mellitus type 2, hypertension, hyperlipidemia, carotid stenosis, stroke and tobacco abuse who presents for follow up. [name] was seen 08/13/16 with chest pain. She  was referred for [facility] 08/24/16 that revealed LVEF 52% and was negative for ischemia. She was also noted to have a history of ischemic stroke and had not undergone TEE. She had a TEE on 08/17/16 that was negative for thrombus and did not reveal an ASD or PFO.  Carotid Doppler showed moderate L ECA stenosis and mild L ICA stenosis.  She was started on aspirin and atorvastatin 80 mg but this was stopped due to myalgias.  She did report frequent palpitations and had a 30 day event monitor placed that revealed occasional PACs and PVCs.  [name] previously had an echo 09/2014 that revealed LVEF 40-45% with diffuse hypokinesis and trace MR.  She had a nuclear stress test that showed LVEF 38% with a fixed apical defect.  She had a LHC at that time without obstructive coronary disease and her EF on left ventriculography was 55% and LVEDP was 10 mmHg.    At her last appointment [name] reported feeling tired and short of breath.  She was referred to pulmonology for shortness of breath. She was felt to have COPD but has not been started on therapy.  Point function testing is pending. At her last appointment she was started on pravastatin due to myalgias on atorvastatin. She denies any muscle aches that change. She continues to smoke less than 1 ppd.  She previously  tried Chantix but had nausea.  She continues to have palpitations but they are better than in the past.   Past Medical History:  Diagnosis Date  . Abnormal stress test   . Anxiety   . Arthritis   . Congenital blindness    L eye  . Depression   . Diabetes mellitus, type II ([place])   . Glaucoma   . Hepatitis B infection pt unsure   resolved  . HTN (hypertension)   . Hyperlipidemia   . Stroke ([place])   . Tobacco abuse    .5 PPD smoker    Past Surgical History:  Procedure Laterality Date  . ABDOMINAL HYSTERECTOMY    . LEFT HEART CATHETERIZATION WITH CORONARY ANGIOGRAM N/A 10/09/2014   Procedure: LEFT HEART CATHETERIZATION WITH CORONARY ANGIOGRAM;  Surgeon: Troy Sine, MD;  Location: [facility] CATH LAB;  Service: Cardiovascular;  Laterality: N/A;  . TEE WITHOUT CARDIOVERSION N/A 08/17/2016   Procedure: TRANSESOPHAGEAL ECHOCARDIOGRAM (TEE);  Surgeon: Sueanne Margarita, MD;  Location: [facility] ENDOSCOPY;  Service: Cardiovascular;  Laterality: N/A;    Current Outpatient Prescriptions  Medication Sig Dispense Refill  . ACCU-CHEK FASTCLIX LANCETS MISC Check blood sugar 3x a day as instructed dx cod 250.00 insulin requiring 102 each 5  . aspirin 81 MG EC tablet TAKE 81 MG BY MOUTH EVERY DAY 30 tablet 0  .  Blood Glucose Calibration (ACCU-CHEK SMARTVIEW CONTROL) LIQD     . Blood Glucose Monitoring Suppl (ACCU-CHEK AVIVA PLUS) W/DEVICE KIT Use to check blood sugar 3 to 4 times daily. diag code 250.62. Insulin dependent 1 kit 0  . carvedilol (COREG) 12.5 MG tablet TAKE 1 TABLET(12.5 MG) BY MOUTH TWICE DAILY 60 tablet 6  . diclofenac sodium (VOLTAREN) 1 % GEL Apply 2 g topically 4 (four) times daily. 100 g 2  . docusate sodium (COLACE) 100 MG capsule Take 1 capsule (100 mg total) by mouth 2 (two) times daily. 10 capsule 0  . doxepin (SINEQUAN) 25 MG capsule Take 25 mg by mouth at bedtime.    . DULoxetine (CYMBALTA) 60 MG capsule Take 1 capsule (60 mg total) by mouth daily. 90 capsule 1  . glucose blood  (ACCU-CHEK AVIVA PLUS) test strip Use to test blood sugar 3 times daily. diag code E11.41. Insulin dependent 100 each 11  . Insulin Glargine (LANTUS SOLOSTAR) 100 UNIT/ML Solostar Pen Inject 48 Units into the skin at bedtime. 15 mL 6  . insulin lispro (HUMALOG KWIKPEN) 100 UNIT/ML KiwkPen Inject 5 units with breakfast, 10 units with lunch, and 10 units with supper. 15 mL 6  . Insulin Pen Needle (VALUMARK PEN NEEDLES) 31G X 8 MM MISC 1 each by Does not apply route daily. 100 each 3  . Lancet Devices (ADJUSTABLE LANCING DEVICE) MISC 1 each.     . losartan (COZAAR) 50 MG tablet TAKE 1 TABLET(50 MG) BY MOUTH TWICE DAILY 180 tablet 3  . metFORMIN (GLUCOPHAGE) 1000 MG tablet TAKE 1 TABLET BY MOUTH TWICE DAILY 180 tablet 3  . mirtazapine (REMERON) 15 MG tablet Take 1 tablet by mouth daily.    . pravastatin (PRAVACHOL) 10 MG tablet 1/2 TABLET BY  MOUTH DAILY 15 tablet 5  . senna-docusate (SENOKOT-S) 8.6-50 MG tablet Take 2 tablets by mouth at bedtime. 60 tablet 1  . timolol (TIMOPTIC) 0.5 % ophthalmic solution Place 1 drop into both eyes as directed.    . traZODone (DESYREL) 100 MG tablet Take 1 tablet by mouth daily.    . TRULICITY 8.25 KN/3.9JQ SOPN INJECT 0.75 MG UNDER THE SKIN ONCE A WEEK( DISCONTINUE BYETTA) 2 mL 3   No current facility-administered medications for this visit.     Allergies:   Ace inhibitors; Chantix [varenicline]; and Lipitor [atorvastatin]    Social History:  The patient  reports that she has been smoking Cigarettes.  She has a 5.00 pack-year smoking history. Her smokeless tobacco use includes Chew. She reports that she does not drink alcohol or use drugs.   Family History:  The patient's family history includes Aneurysm in her brother and sister; CVA in her brother, maternal grandfather, maternal grandmother, mother, and sister; Heart disease (age of onset: 62) in her father and mother; Hypertension in her mother.    ROS:  Please see the history of present illness.    Otherwise, review of systems are positive for none.   All other systems are reviewed and negative.    PHYSICAL EXAM: VS:  BP 131/89   Pulse 83   Ht '5\' 7"'  (1.702 m)   Wt 88 kg (194 lb)   SpO2 98%   BMI 30.38 kg/m  , BMI Body mass index is 30.38 kg/m. GENERAL:  Well appearing HEENT:  Pupils equal round and reactive, fundi not visualized, oral mucosa unremarkable NECK:  No jugular venous distention, waveform within normal limits, carotid upstroke brisk and symmetric, no bruits LYMPHATICS:  No  cervical adenopathy LUNGS:  Clear to auscultation bilaterally HEART:  RRR.  PMI not displaced or sustained,S1 and S2 within normal limits, no S3, no S4, no clicks, no rubs, no murmurs ABD:  Flat, positive bowel sounds normal in frequency in pitch, no bruits, no rebound, no guarding, no midline pulsatile mass, no hepatomegaly, no splenomegaly EXT:  2 plus pulses throughout, no edema, no cyanosis no clubbing SKIN:  No rashes no nodules NEURO:  Cranial nerves II through XII grossly intact, motor grossly intact throughout PSYCH:  Cognitively intact, oriented to person place and time   EKG:  EKG is not ordered today. The ekg ordered 08/13/16 demonstrates sinus rhythm rate 83 bpm.  Non-specific ST-T changes. Sinus rhythm. Rate 76 bpm.  30 Day Event Monitor 09/01/16: Quality: Fair.  Baseline artifact. Average heart rate: 80 bpm Pauses >2.5 seconds: 0 Occasional PACs and PVCs  TEE 08/17/16: Study Conclusions  - Left ventricle: Systolic function was normal. The estimated   ejection fraction was in the range of 50% to 55%. Wall motion was   normal; there were no regional wall motion abnormalities. - Mitral valve: There was mild regurgitation. - Left atrium: No evidence of thrombus in the atrial cavity or   appendage. No evidence of thrombus in the atrial cavity or   appendage. - Right atrium: No evidence of thrombus in the atrial cavity or   appendage. - Tricuspid valve: There was trivial  regurgitation. - Pulmonic valve: No evidence of vegetation. There was trivial   regurgitation.  Lexiscan Myoview 08/24/16:  The left ventricular ejection fraction is normal (55-65%).  The computer generated EF is 52%. Visually, the EF is closer to 60% and is normal.  There was no ST segment deviation noted during stress.  The study is normal.  This is a low risk study.  Echo 09/2014: LVEF 40-45% with diffuse hypokinesis and trace MR.   Nuclear stress test 09/2014: LVEF 38% with a fixed apical defect. LHC 10/2014: No obstructive coronary disease.  EF on left ventriculography was 55% and LVEDP of 10 mmHg.    Carotid Doppler 07/24/16: >50% L ECA stenosis; 1-39% L ICA stenosis  Echo 07/24/16: LVEF 55-60%.  Restrictive physiology was noted.   Recent Labs: 07/23/2016: Hemoglobin 12.6 08/25/2016: ALT 16; BUN 12; Creat 0.93; Potassium 4.7; Sodium 140 01/04/2017: Platelets 316 02/01/2017: TSH 0.695    Lipid Panel    Component Value Date/Time   CHOL 118 (L) 08/25/2016 1118   TRIG 123 08/25/2016 1118   HDL 40 (L) 08/25/2016 1118   CHOLHDL 3.0 08/25/2016 1118   VLDL 25 08/25/2016 1118   LDLCALC 53 08/25/2016 1118   LDLDIRECT 183 (H) 03/01/2014 0500      Wt Readings from Last 3 Encounters:  03/17/17 88 kg (194 lb)  03/10/17 87.2 kg (192 lb 3.2 oz)  02/03/17 86.1 kg (189 lb 12.8 oz)      ASSESSMENT AND PLAN:  # Hypertension: # PVCs: BP Is above goal today. However she has not yet taken her antihypertensives.  Continue carvedilol and losartan.   # Hyperlipidemia: # Myalgias: Ms. Landau didn't tolerate atorvastatin 2/2 myalgias.  She is doing well and pravastatin. We will check lipids and CMP today.   # Tobacco abuse: Ms. Bellissimo was encouraged to continue trying to quit smoking.patches have been helpful for her in the past. She is not ready to quit at this time.  # Stroke:  # Carotid stenosis: Continue aspirin and pravavastatin.  # Shortness of breath: Ms. Sen  continues to report shortness of breath.  She is euvolemic and stress was negative for ischemia.  She continues to smoke.  She is currently being evaluated by pulmonology.    Current medicines are reviewed at length with the patient today.  The patient does not have concerns regarding medicines.  The following changes have been made:  no change  Labs/ tests ordered today include:   Orders Placed This Encounter  Procedures  . Lipid panel  . Comprehensive metabolic panel     Disposition:   FU with Opie Fanton C. Oval Linsey, MD, [facility] in 6 months   This note was written with the assistance of speech recognition software.  Please excuse any transcriptional errors.  Signed, Paysen Goza C. Oval Linsey, MD, [facility]  03/17/2017 1:02 PM    [place]

## 2017-03-17 ENCOUNTER — Ambulatory Visit (INDEPENDENT_AMBULATORY_CARE_PROVIDER_SITE_OTHER): Payer: Medicare Other | Admitting: Cardiovascular Disease

## 2017-03-17 ENCOUNTER — Encounter: Payer: Self-pay | Admitting: Cardiovascular Disease

## 2017-03-17 VITALS — BP 131/89 | HR 83 | Ht 67.0 in | Wt 194.0 lb

## 2017-03-17 DIAGNOSIS — E7800 Pure hypercholesterolemia, unspecified: Secondary | ICD-10-CM | POA: Diagnosis not present

## 2017-03-17 DIAGNOSIS — I493 Ventricular premature depolarization: Secondary | ICD-10-CM

## 2017-03-17 DIAGNOSIS — Z5181 Encounter for therapeutic drug level monitoring: Secondary | ICD-10-CM | POA: Diagnosis not present

## 2017-03-17 DIAGNOSIS — I10 Essential (primary) hypertension: Secondary | ICD-10-CM

## 2017-03-17 DIAGNOSIS — R002 Palpitations: Secondary | ICD-10-CM | POA: Diagnosis not present

## 2017-03-17 DIAGNOSIS — Z720 Tobacco use: Secondary | ICD-10-CM

## 2017-03-17 NOTE — Patient Instructions (Signed)
Medication Instructions:  Your physician recommends that you continue on your current medications as directed. Please refer to the Current Medication list given to you today.  Labwork: FASTING LP/CMET SOON AT [facility]   Testing/Procedures: NONE  Follow-Up: Your physician recommends that you schedule a follow-up appointment in: [place]  If you need a refill on your cardiac medications before your next appointment, please call your pharmacy.

## 2017-03-22 ENCOUNTER — Other Ambulatory Visit: Payer: Self-pay | Admitting: Student in an Organized Health Care Education/Training Program

## 2017-03-23 ENCOUNTER — Other Ambulatory Visit: Payer: Self-pay | Admitting: Internal Medicine

## 2017-04-05 DIAGNOSIS — Z86010 Personal history of colonic polyps: Secondary | ICD-10-CM | POA: Diagnosis not present

## 2017-04-05 DIAGNOSIS — K5900 Constipation, unspecified: Secondary | ICD-10-CM | POA: Diagnosis not present

## 2017-04-05 DIAGNOSIS — D123 Benign neoplasm of transverse colon: Secondary | ICD-10-CM | POA: Diagnosis not present

## 2017-04-07 DIAGNOSIS — K5900 Constipation, unspecified: Secondary | ICD-10-CM | POA: Diagnosis not present

## 2017-04-07 DIAGNOSIS — D123 Benign neoplasm of transverse colon: Secondary | ICD-10-CM | POA: Diagnosis not present

## 2017-04-14 ENCOUNTER — Telehealth: Payer: Self-pay | Admitting: *Deleted

## 2017-04-14 NOTE — Telephone Encounter (Signed)
Spoke with patient regarding labs Dr Oval Linsey requested at office visit in March Patient stated she would go soon for fasting LP/CMET

## 2017-04-20 ENCOUNTER — Telehealth: Payer: Self-pay

## 2017-04-20 NOTE — Telephone Encounter (Signed)
Susan Holmes is a 61 y.o. female who was contacted on behalf of [facility] Geriatrics Task Force.   Diabetes Assessment  DM meds and BS checks -  "What medications are you taking for diabetes?" insulin, metformin -  "How often are you checking your blood sugars at home?" 3x - "What have your blood sugars been?" unsure, but nothing below 70. Pt was informed that her A1C dropped a lot, so we wanted to make sure she was feeling okay with her lower blood sugars. No sx of hypoglycemia  High Blood Pressure Assessment  BP meds & BP checks -  "What medications are you taking for high blood pressure?" unsure of names, but "gets them every month" - "How often are you checking your blood pressure at home?" Most days - "What have your blood pressure readings been?" Does not remember  Coping with DM and BP - What else are you doing to help yourself with your DM and BP - Diet? unknown Exercise? unknown  Medication Access Issues  Medication Issues? -  "Are you getting your medicines refilled on time without skipping any doses?" Yes - "Are you having any problems getting/taking your medicines? - cost, timing, transportation?" No  Conclusion  Close the call - Confirmed importance and date/time of follow-up visits scheduled 04/26/2017 - "Any other questions or concerns?" No - "Thanks for speaking with me today"    Note: patient did not have much time to talk, so questions about diet and exercise were missed. Pt informed to call us with any questions or when she needs refills on medications if prior to next visit.  Florinda Marker PharmD Candidate

## 2017-04-20 NOTE — Telephone Encounter (Signed)
Patient was contacted by Florinda Marker. I agree with the assessment and plan of care documented.

## 2017-04-26 ENCOUNTER — Ambulatory Visit (INDEPENDENT_AMBULATORY_CARE_PROVIDER_SITE_OTHER): Payer: Medicare Other | Admitting: Internal Medicine

## 2017-04-26 VITALS — BP 142/84 | HR 80 | Temp 97.9°F | Ht 67.0 in | Wt 191.2 lb

## 2017-04-26 DIAGNOSIS — R0602 Shortness of breath: Secondary | ICD-10-CM

## 2017-04-26 DIAGNOSIS — E1165 Type 2 diabetes mellitus with hyperglycemia: Secondary | ICD-10-CM | POA: Diagnosis not present

## 2017-04-26 DIAGNOSIS — R5383 Other fatigue: Secondary | ICD-10-CM | POA: Diagnosis not present

## 2017-04-26 DIAGNOSIS — Z716 Tobacco abuse counseling: Secondary | ICD-10-CM | POA: Diagnosis not present

## 2017-04-26 DIAGNOSIS — M7531 Calcific tendinitis of right shoulder: Secondary | ICD-10-CM

## 2017-04-26 DIAGNOSIS — E1149 Type 2 diabetes mellitus with other diabetic neurological complication: Secondary | ICD-10-CM | POA: Diagnosis not present

## 2017-04-26 DIAGNOSIS — F17210 Nicotine dependence, cigarettes, uncomplicated: Secondary | ICD-10-CM

## 2017-04-26 DIAGNOSIS — Z794 Long term (current) use of insulin: Secondary | ICD-10-CM

## 2017-04-26 DIAGNOSIS — R1013 Epigastric pain: Secondary | ICD-10-CM

## 2017-04-26 DIAGNOSIS — E7800 Pure hypercholesterolemia, unspecified: Secondary | ICD-10-CM | POA: Diagnosis not present

## 2017-04-26 DIAGNOSIS — E1140 Type 2 diabetes mellitus with diabetic neuropathy, unspecified: Secondary | ICD-10-CM

## 2017-04-26 DIAGNOSIS — IMO0002 Reserved for concepts with insufficient information to code with codable children: Secondary | ICD-10-CM

## 2017-04-26 DIAGNOSIS — R05 Cough: Secondary | ICD-10-CM | POA: Diagnosis not present

## 2017-04-26 DIAGNOSIS — F17200 Nicotine dependence, unspecified, uncomplicated: Secondary | ICD-10-CM

## 2017-04-26 LAB — GLUCOSE, CAPILLARY: Glucose-Capillary: 205 mg/dL — ABNORMAL HIGH (ref 65–99)

## 2017-04-26 LAB — POCT GLYCOSYLATED HEMOGLOBIN (HGB A1C): Hemoglobin A1C: 10

## 2017-04-26 MED ORDER — PANTOPRAZOLE SODIUM 40 MG PO TBEC: 40.0000 mg | DELAYED_RELEASE_TABLET | Freq: Every day | ORAL | 0 refills | Status: DC

## 2017-04-26 MED ORDER — INSULIN LISPRO 100 UNIT/ML (KWIKPEN): PEN_INJECTOR | SUBCUTANEOUS | 6 refills | Status: DC

## 2017-04-26 MED ORDER — INSULIN GLARGINE 100 UNIT/ML SOLOSTAR PEN: 54.0000 [IU] | PEN_INJECTOR | Freq: Every day | SUBCUTANEOUS | 6 refills | Status: DC

## 2017-04-26 MED ORDER — FAMOTIDINE 20 MG PO TABS: 20.0000 mg | ORAL_TABLET | Freq: Every day | ORAL | 0 refills | Status: DC | PRN

## 2017-04-26 NOTE — Patient Instructions (Addendum)
For diabetes:  Use Lantus 54 units at bedtime as instructed.  Use Humalog 10 units with lunch and 20 units with dinner as instructed.  Continue using Trulicity and Metformin as before   For indigestion:  Take Protonix 40 mg daily   Take Pepcid 20 mg daily as needed    STOP taking Mirtazapine

## 2017-04-27 LAB — LIPID PANEL
Cholesterol: 142 mg/dL (ref <200)
HDL: 42 mg/dL — ABNORMAL LOW (ref >50)
LDL Cholesterol: 69 mg/dL (ref <100)
Total CHOL/HDL Ratio: 3.4 Ratio (ref <5.0)
Triglycerides: 154 mg/dL — ABNORMAL HIGH (ref <150)
VLDL: 31 mg/dL — ABNORMAL HIGH (ref <30)

## 2017-04-27 NOTE — Assessment & Plan Note (Addendum)
History of present illness Patient complaining of a two-week history of nausea; no episodes of vomiting. Denies having any abdominal pain, diarrhea, constipation, fevers, or chills. Also reports having a bitter taste in her mouth. States her symptoms are especially worse in the morning. States she has tried Pepto-Bismol and it is not helping.  Assessment Dyspepsia  Plan -Trial of Protonix 40 mg daily -Pepcid 20 mg daily as needed

## 2017-04-27 NOTE — Assessment & Plan Note (Addendum)
Assessment Patient is complaining of fatigue and SOB at this visit. Reports sleeping adequately (11 hours of sleep every night). Hemoglobin checked in January 2018 was 12. TSH checked during recent visit was normal. Denies having any fevers or noticing any lumps anywhere. Review of chart shows she has gained 5 pounds since January 2018. Reports having occasional cough and continues to smoke <1/2 PPD. In addition, endorses acid reflux symptoms. Denies having any wheezing. Her cough is likely 2/2 smoking and GERD. She is euvolemic on exam. Stress test done 07/2016 was low risk. If is noted patient is currently on both mirtazapine and Cymbalta. She is not sure why she takes mirtazapine. Polypharmacy could possibly be contributing to her fatigue. In addition, patient is currently on a BB for PVCs which could also be playing a role.   Plan -Discontinue Mirtazapine -Continue Cymbalta for depression and chronic pains  -Educated patient about healthy eating and exercise. Emphasized the importance of weight loss.  -Will continue BB at this time. Patient is being followed by cardiology.

## 2017-04-27 NOTE — Assessment & Plan Note (Signed)
Assessment Patient is not using nicotine patches. She is smoking <1/2 pack per day and is not ready to quit at this time. I spent a dedicated 3-4 minutes discussing smoking cessation.  Plan -Discuss again at next visit.

## 2017-04-27 NOTE — Assessment & Plan Note (Signed)
Lab Results  Component Value Date   HGBA1C 10.0 04/26/2017   HGBA1C 7.1 02/01/2017   HGBA1C 7.2 10/26/2016     Assessment: Diabetes control:  above goal Progress toward A1C goal:   deteriorated Comments: Current regimen includes Lantus 48 units at bedtime, trulicity 8.88 mg weekly, and metformin 1000 mg twice daily. She was previously advised to use Humalog 5 units at breakfast, 10 units at lunch, and 10 units with dinner. Patient states she is only using 10 units lunch and 10 units with dinner she does not eat breakfast. Meter showing average CBG 269, high 600, and low 91. Patient was noted to be drinking regular soda during this visit. Reports eating an entire Hers[employer] chocolate bar every other night and states she likes to potato chips as well.  Plan: Medications: Increase Lantus to 54 units at bedtime. Change Humalog to 10 units with lunch and 20 units with dinner. Continue trulicity and metformin as above. Home glucose monitoring: Frequency:  3 times a day Timing:  before meals Instruction/counseling given: reminded to get eye exam, reminded to bring blood glucose meter & log to each visit, reminded to bring medications to each visit, discussed foot care, discussed the need for weight loss, discussed diet and discussed sick day management Other plans:  -Return to clinic in 1 month with meter.

## 2017-04-27 NOTE — Progress Notes (Signed)
   CC: Patient is complaining of nausea, fatigue, and right shoulder pain.  HPI:  [name] is a 61 y.o. female with a past medical history of conditions listed below presenting to the clinic complaining of nausea, fatigue, and right shoulder pain. Diabetes and tobacco use were also discussed during this visit. Please see problem based charting for the status of the patient's current and chronic medical conditions.   Past Medical History:  Diagnosis Date  . Abnormal stress test   . Anxiety   . Arthritis   . Congenital blindness    L eye  . Depression   . Diabetes mellitus, type II ([place])   . Glaucoma   . Hepatitis B infection pt unsure   resolved  . HTN (hypertension)   . Hyperlipidemia   . Stroke ([place])   . Tobacco abuse    .5 PPD smoker    Review of Systems:  Review of Systems  Constitutional: Positive for malaise/fatigue. Negative for chills and fever.  Respiratory: Negative for cough and shortness of breath.   Cardiovascular: Negative for chest pain and leg swelling.  Gastrointestinal: Positive for nausea. Negative for abdominal pain, constipation, diarrhea and vomiting.    Physical Exam:  Vitals:   04/26/17 1519  BP: (!) 142/84  Pulse: 80  Temp: 97.9 F (36.6 C)  TempSrc: Oral  SpO2: 100%  Weight: 191 lb 3.2 oz (86.7 kg)  Height: 5\' 7"  (1.702 m)   Physical Exam  Constitutional: She is oriented to person, place, and time. She appears well-developed and well-nourished. No distress.  HENT:  Head: Normocephalic and atraumatic.  Eyes: EOM are normal.  Neck: Neck supple. No tracheal deviation present.  Cardiovascular: Normal rate, regular rhythm and intact distal pulses.   Pulmonary/Chest: Effort normal and breath sounds normal. No respiratory distress. She has no wheezes. She has no rales.  Abdominal: Soft. Bowel sounds are normal. She exhibits no distension. There is no tenderness.  Musculoskeletal: Normal range of motion. She exhibits no edema.    Neurological: She is alert and oriented to person, place, and time.  Skin: Skin is warm and dry.    Assessment & Plan:   See Encounters Tab for problem based charting.  Patient discussed with Dr. Angelia Mould

## 2017-04-27 NOTE — Assessment & Plan Note (Signed)
Assessment Patient continues to complain of right shoulder pain at this visit. States she has not used Voltaren gel. She continues to take Cymbalta. States she is not interested in physical therapy. I discussed giving a steroid injection at this visit and patient declined stating she had to go somewhere.  Plan -Advised her to start using Voltaren gel 4 times daily

## 2017-04-28 NOTE — Addendum Note (Signed)
Addended by: Orson Gear on: 04/28/2017 11:00 AM   Modules accepted: Orders

## 2017-05-03 DIAGNOSIS — H401122 Primary open-angle glaucoma, left eye, moderate stage: Secondary | ICD-10-CM | POA: Diagnosis not present

## 2017-05-03 DIAGNOSIS — H401113 Primary open-angle glaucoma, right eye, severe stage: Secondary | ICD-10-CM | POA: Diagnosis not present

## 2017-05-03 DIAGNOSIS — Z961 Presence of intraocular lens: Secondary | ICD-10-CM | POA: Diagnosis not present

## 2017-05-03 NOTE — Progress Notes (Signed)
Internal Medicine Clinic Attending  Case discussed with Dr. Rathoreat the time of the visit. We reviewed the resident's history and exam and pertinent patient test results. I agree with the assessment, diagnosis, and plan of care documented in the resident's note.  

## 2017-05-17 ENCOUNTER — Other Ambulatory Visit (INDEPENDENT_AMBULATORY_CARE_PROVIDER_SITE_OTHER): Payer: Medicare Other

## 2017-05-17 ENCOUNTER — Encounter: Payer: Self-pay | Admitting: Pulmonary Disease

## 2017-05-17 ENCOUNTER — Ambulatory Visit (INDEPENDENT_AMBULATORY_CARE_PROVIDER_SITE_OTHER): Payer: Medicare Other | Admitting: Pulmonary Disease

## 2017-05-17 VITALS — BP 140/82 | HR 91 | Ht 67.0 in | Wt 192.0 lb

## 2017-05-17 DIAGNOSIS — R0602 Shortness of breath: Secondary | ICD-10-CM | POA: Diagnosis not present

## 2017-05-17 LAB — PULMONARY FUNCTION TEST
DL/VA % pred: 83 %
DL/VA: 4.28 ml/min/mmHg/L
DLCO cor % pred: 50 %
DLCO cor: 14.39 ml/min/mmHg
DLCO unc % pred: 49 %
DLCO unc: 14.07 ml/min/mmHg
FEF 25-75 Post: 2.64 L/sec
FEF 25-75 Pre: 1.42 L/sec
FEF2575-%Change-Post: 85 %
FEF2575-%Pred-Post: 120 %
FEF2575-%Pred-Pre: 64 %
FEV1-%Change-Post: 11 %
FEV1-%Pred-Post: 66 %
FEV1-%Pred-Pre: 59 %
FEV1-Post: 1.54 L
FEV1-Pre: 1.38 L
FEV1FVC-%Change-Post: 6 %
FEV1FVC-%Pred-Pre: 107 %
FEV6-%Change-Post: 4 %
FEV6-%Pred-Post: 59 %
FEV6-%Pred-Pre: 57 %
FEV6-Post: 1.7 L
FEV6-Pre: 1.63 L
FEV6FVC-%Pred-Post: 103 %
FEV6FVC-%Pred-Pre: 103 %
FVC-%Change-Post: 4 %
FVC-%Pred-Post: 57 %
FVC-%Pred-Pre: 55 %
FVC-Post: 1.7 L
FVC-Pre: 1.63 L
Post FEV1/FVC ratio: 90 %
Post FEV6/FVC ratio: 100 %
Pre FEV1/FVC ratio: 85 %
Pre FEV6/FVC Ratio: 100 %
RV % pred: 59 %
RV: 1.3 L
TLC % pred: 56 %
TLC: 3.12 L

## 2017-05-17 LAB — CBC WITH DIFFERENTIAL/PLATELET
Basophils Absolute: 0 10*3/uL (ref 0.0–0.1)
Basophils Relative: 0.4 % (ref 0.0–3.0)
Eosinophils Absolute: 0.5 10*3/uL (ref 0.0–0.7)
Eosinophils Relative: 4.9 % (ref 0.0–5.0)
HCT: 39.2 % (ref 36.0–46.0)
Hemoglobin: 12.2 g/dL (ref 12.0–15.0)
Lymphocytes Relative: 47.5 % — ABNORMAL HIGH (ref 12.0–46.0)
Lymphs Abs: 4.6 10*3/uL — ABNORMAL HIGH (ref 0.7–4.0)
MCHC: 31.2 g/dL (ref 30.0–36.0)
MCV: 69.5 fl — ABNORMAL LOW (ref 78.0–100.0)
Monocytes Absolute: 0.5 10*3/uL (ref 0.1–1.0)
Monocytes Relative: 4.7 % (ref 3.0–12.0)
Neutro Abs: 4.1 10*3/uL (ref 1.4–7.7)
Neutrophils Relative %: 42.5 % — ABNORMAL LOW (ref 43.0–77.0)
Platelets: 262 10*3/uL (ref 150.0–400.0)
RBC: 5.64 Mil/uL — ABNORMAL HIGH (ref 3.87–5.11)
RDW: 14.9 % (ref 11.5–15.5)
WBC: 9.7 10*3/uL (ref 4.0–10.5)

## 2017-05-17 LAB — NITRIC OXIDE: Nitric Oxide: 80

## 2017-05-17 NOTE — Patient Instructions (Signed)
We will check CBC with differential and a blood allergy profile We'll start Symbicort 160/4.5  Return to clinic in 3 months.

## 2017-05-17 NOTE — Progress Notes (Signed)
PFT done today. 

## 2017-05-17 NOTE — Progress Notes (Signed)
Susan Holmes    [id number]    [birth year]  Primary Care Physician:Rathore, Wandra Feinstein, MD  Referring Physician: Shela Leff, MD [place], [place] [id number]  Chief complaint: Follow-up for shortness of breath  HPI: 61 year old with past medical history of hypertension, hyperlipidemia, stroke, diabetes, hepatitis B. with complaints of progressive dyspnea on exertion for the past couple of years. She denies dyspnea at rest, cough, mucus production, wheezing. She is an active smoker and had smoked about three fourth pack for 20 years. She continues to smoke about quarter pack per day.. She had tried to quit in the past but could not tolerate Chantix. 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 She does not report any exposures at work or at home.    Outpatient Encounter Prescriptions as of 05/17/2017  Medication Sig  . ACCU-CHEK FASTCLIX LANCETS MISC Check blood sugar 3x a day as instructed dx cod 250.00 insulin requiring  . aspirin 81 MG EC tablet TAKE 81 MG BY MOUTH EVERY DAY  . Blood Glucose Calibration (ACCU-CHEK SMARTVIEW CONTROL) LIQD   . Blood Glucose Monitoring Suppl (ACCU-CHEK AVIVA PLUS) W/DEVICE KIT Use to check blood sugar 3 to 4 times daily. diag code 250.62. Insulin dependent  . carvedilol (COREG) 12.5 MG tablet TAKE 1 TABLET(12.5 MG) BY MOUTH TWICE DAILY  . diclofenac sodium (VOLTAREN) 1 % GEL Apply 2 g topically 4 (four) times daily.  Marland Kitchen docusate sodium (COLACE) 100 MG capsule Take 1 capsule (100 mg total) by mouth 2 (two) times daily.  Marland Kitchen doxepin (SINEQUAN) 25 MG capsule Take 25 mg by mouth at bedtime.  . DULoxetine (CYMBALTA) 60 MG capsule Take 1 capsule (60 mg total) by mouth daily.  . famotidine (PEPCID) 20 MG tablet Take 1 tablet (20 mg total) by mouth daily as needed for heartburn or indigestion.  Marland Kitchen glucose blood (ACCU-CHEK AVIVA PLUS) test strip Use to test blood sugar 3 times daily. diag code  E11.41. Insulin dependent  . Insulin Glargine (LANTUS SOLOSTAR) 100 UNIT/ML Solostar Pen Inject 54 Units into the skin at bedtime.  . insulin lispro (HUMALOG KWIKPEN) 100 UNIT/ML KiwkPen Inject 10 units with lunch and 20 units with supper.  . Insulin Pen Needle (VALUMARK PEN NEEDLES) 31G X 8 MM MISC 1 each by Does not apply route daily.  Elmore Guise Devices (ADJUSTABLE LANCING DEVICE) MISC 1 each.   . losartan (COZAAR) 50 MG tablet TAKE 1 TABLET(50 MG) BY MOUTH TWICE DAILY  . metFORMIN (GLUCOPHAGE) 1000 MG tablet TAKE 1 TABLET BY MOUTH TWICE DAILY  . pantoprazole (PROTONIX) 40 MG tablet Take 1 tablet (40 mg total) by mouth daily.  . pravastatin (PRAVACHOL) 10 MG tablet 1/2 TABLET BY  MOUTH DAILY  . senna-docusate (SENOKOT-S) 8.6-50 MG tablet Take 2 tablets by mouth at bedtime.  . timolol (TIMOPTIC) 0.5 % ophthalmic solution Place 1 drop into both eyes as directed.  . traZODone (DESYREL) 100 MG tablet Take 1 tablet by mouth daily.  . TRULICITY 9.32 IZ/1.2WP SOPN INJECT 0.75 MG UNDER THE SKIN ONCE A WEEK( DISCONTINUE BYETTA)   No facility-administered encounter medications on file as of 05/17/2017.     Allergies as of 05/17/2017 - Review Complete 05/17/2017  Allergen Reaction Noted  . Ace inhibitors Cough 05/01/2012  . Chantix [varenicline] Nausea And Vomiting 11/09/2016  . Lipitor [atorvastatin]  11/09/2016    Past Medical History:  Diagnosis Date  . Abnormal stress  test   . Anxiety   . Arthritis   . Congenital blindness    L eye  . Depression   . Diabetes mellitus, type II ([place])   . Glaucoma   . Hepatitis B infection pt unsure   resolved  . HTN (hypertension)   . Hyperlipidemia   . Stroke ([place])   . Tobacco abuse    .5 PPD smoker    Past Surgical History:  Procedure Laterality Date  . ABDOMINAL HYSTERECTOMY    . LEFT HEART CATHETERIZATION WITH CORONARY ANGIOGRAM N/A 10/09/2014   Procedure: LEFT HEART CATHETERIZATION WITH CORONARY ANGIOGRAM;  Surgeon: Troy Sine, MD;   Location: [facility] CATH LAB;  Service: Cardiovascular;  Laterality: N/A;  . TEE WITHOUT CARDIOVERSION N/A 08/17/2016   Procedure: TRANSESOPHAGEAL ECHOCARDIOGRAM (TEE);  Surgeon: Sueanne Margarita, MD;  Location[facility] ENDOSCOPY;  Service: Cardiovascular;  Laterality: N/A;    Family History  Problem Relation Age of Onset  . Heart disease Mother 16  . Hypertension Mother   . CVA Mother   . Heart disease Father 45  . CVA Sister   . CVA Brother   . Aneurysm Brother   . Aneurysm Sister   . CVA Maternal Grandmother   . CVA Maternal Grandfather   . Colon cancer Neg Hx   . Esophageal cancer Neg Hx   . Stomach cancer Neg Hx     Social History   Social History  . Marital status: Divorced    Spouse name: N/A  . Number of children: 1  . Years of education: 85   Occupational History  .  Unemployed   Social History Main Topics  . Smoking status: Current Every Day Smoker    Packs/day: 0.25    Years: 20.00    Types: Cigarettes  . Smokeless tobacco: Current User    Types: Chew  . Alcohol use No  . Drug use: No  . Sexual activity: Not Currently    Birth control/ protection: Surgical   Other Topics Concern  . Not on file   Social History Narrative  . No narrative on file    Review of systems: Review of Systems  Constitutional: Negative for fever and chills.  HENT: Negative.   Eyes: Negative for blurred vision.  Respiratory: as per HPI  Cardiovascular: Negative for chest pain and palpitations.  Gastrointestinal: Negative for vomiting, diarrhea, blood per rectum. Genitourinary: Negative for dysuria, urgency, frequency and hematuria.  Musculoskeletal: Negative for myalgias, back pain and joint pain.  Skin: Negative for itching and rash.  Neurological: Negative for dizziness, tremors, focal weakness, seizures and loss of consciousness.  Endo/Heme/Allergies: Negative for environmental allergies.  Psychiatric/Behavioral: Negative for depression, suicidal ideas and hallucinations.  All  other systems reviewed and are negative.  Physical Exam: Blood pressure 120/72, pulse 74, height '5\' 7"'$  (1.702 m), weight 192 lb 3.2 oz (87.2 kg), SpO2 97 %. Gen:      No acute distress HEENT:  EOMI, sclera anicteric Neck:     No masses; no thyromegaly Lungs:    Clear to auscultation bilaterally; normal respiratory effort CV:         Regular rate and rhythm; no murmurs Abd:      + bowel sounds; soft, non-tender; no palpable masses, no distension Ext:    No edema; adequate peripheral perfusion Skin:      Warm and dry; no rash Neuro: alert and oriented x 3 Psych: normal mood and affect  Data Reviewed: Echo 07/24/16 Left ventricle: The cavity size  was normal. Systolic function was   normal. The estimated ejection fraction was in the range of 55%   to 60%. Wall motion was normal; there were no regional wall   motion abnormalities. Doppler parameters are consistent with   restrictive physiology, indicative of decreased left ventricular   diastolic compliance and/or increased left atrial pressure.  PFTs 10/01/14 FVC  1.79 (59%0 FEV1 1.44 [60%) F/F 80  PFTs 05/17/17 FVC 1.70 (57%) FEV1 1.54 [66%) F/F 90 TLC 56% DLCO 49% No obstruction, moderate-severe restriction in DLCO impairment Small airways disease with increase in flow rates after albuterol  FENO 05/17/17- 80  CT chest 10/31/13- no lung abnormality Chest x-ray 06/27/16-no active cardiopulmonary process Chest x-ray 03/10/17-cardio megaly with mild CHF I reviewed her images personally  Assessment:  Dyspnea on exertion  We reviewed her PFTs today. Although there is no overt obstruction, there is reduction in mid flow rates which improved significantly after albuterol suggestive of reactive airway disease. Besides FENO is markedly elevated suggestive of asthma. I'll get CBC with differential to evaluate for eosinophilia and a b;lood allergy profile. We'll start her on Symbicort twice a day.  This is also restriction in DLCO  impairment but no evidence of interstitial lung disease on chest imaging. We'll continue to follow this.   Active smoker She wants to quit but she could not tolerate chantix. She has tried nicotine in the past but could not quit.. Time spent counseling- 5 mins.   Plan/Recommendations: - CBC with differential, blood allergy profile - Start symbicort - Smoking cessation  Marshell Garfinkel MD [facility] Pager [phone] 05/17/2017, 1:32 PM  CC: Shela Leff, MD

## 2017-05-18 LAB — RESPIRATORY ALLERGY PROFILE REGION II ~~LOC~~
Allergen, A. alternata, m6: <0.1 kU/L
Allergen, C. Herbarum, M2: <0.1 kU/L
Allergen, Cedar tree, t12: <0.1 kU/L
Allergen, Comm Silver Birch, t9: <0.1 kU/L
Allergen, Cottonwood, t14: <0.1 kU/L
Allergen, D pternoyssinus,d7: 7.81 kU/L — ABNORMAL HIGH
Allergen, Mouse Urine Protein, e78: <0.1 kU/L
Allergen, Mulberry, t76: <0.1 kU/L
Allergen, Oak,t7: <0.1 kU/L
Allergen, P. notatum, m1: <0.1 kU/L
Aspergillus fumigatus, m3: <0.1 kU/L
Bermuda Grass: <0.1 kU/L
Box Elder IgE: <0.1 kU/L
Cat Dander: 0.3 kU/L — ABNORMAL HIGH
Cockroach: 0.12 kU/L — ABNORMAL HIGH
Common Ragweed: 0.27 kU/L — ABNORMAL HIGH
D. farinae: 13.9 kU/L — ABNORMAL HIGH
Dog Dander: <0.1 kU/L
Elm IgE: <0.1 kU/L
IgE (Immunoglobulin E), Serum: 110 kU/L (ref <115)
Johnson Grass: <0.1 kU/L
Pecan/Hickory Tree IgE: <0.1 kU/L
Rough Pigweed  IgE: <0.1 kU/L
Sheep Sorrel IgE: <0.1 kU/L
Timothy Grass: 0.16 kU/L — ABNORMAL HIGH

## 2017-05-24 ENCOUNTER — Telehealth: Payer: Self-pay | Admitting: Pulmonary Disease

## 2017-05-24 DIAGNOSIS — H401113 Primary open-angle glaucoma, right eye, severe stage: Secondary | ICD-10-CM | POA: Diagnosis not present

## 2017-05-24 DIAGNOSIS — Z961 Presence of intraocular lens: Secondary | ICD-10-CM | POA: Diagnosis not present

## 2017-05-24 MED ORDER — BUDESONIDE-FORMOTEROL FUMARATE 160-4.5 MCG/ACT IN AERO: 2.0000 | INHALATION_SPRAY | Freq: Two times a day (BID) | RESPIRATORY_TRACT | 6 refills | Status: DC

## 2017-05-24 NOTE — Telephone Encounter (Signed)
Called and spoke with pt and she stated that her pharmacy never received the inhaler that PM was going to send in at her last ov on 5/22.  This was the symbicort 160---this has been sent to her pharmacy and pt is aware that she will need to contact them this afternoon to make sure this is ready to be picked up.

## 2017-06-07 ENCOUNTER — Ambulatory Visit (INDEPENDENT_AMBULATORY_CARE_PROVIDER_SITE_OTHER): Payer: Medicare Other | Admitting: Internal Medicine

## 2017-06-07 ENCOUNTER — Encounter: Payer: Self-pay | Admitting: Internal Medicine

## 2017-06-07 VITALS — BP 144/90 | HR 83 | Temp 98.2°F | Ht 67.0 in | Wt 192.8 lb

## 2017-06-07 DIAGNOSIS — F17210 Nicotine dependence, cigarettes, uncomplicated: Secondary | ICD-10-CM

## 2017-06-07 DIAGNOSIS — I10 Essential (primary) hypertension: Secondary | ICD-10-CM

## 2017-06-07 DIAGNOSIS — Z794 Long term (current) use of insulin: Secondary | ICD-10-CM | POA: Diagnosis not present

## 2017-06-07 DIAGNOSIS — E1140 Type 2 diabetes mellitus with diabetic neuropathy, unspecified: Secondary | ICD-10-CM

## 2017-06-07 DIAGNOSIS — IMO0002 Reserved for concepts with insufficient information to code with codable children: Secondary | ICD-10-CM

## 2017-06-07 DIAGNOSIS — R6889 Other general symptoms and signs: Secondary | ICD-10-CM

## 2017-06-07 DIAGNOSIS — E1149 Type 2 diabetes mellitus with other diabetic neurological complication: Secondary | ICD-10-CM | POA: Diagnosis not present

## 2017-06-07 DIAGNOSIS — R413 Other amnesia: Secondary | ICD-10-CM | POA: Diagnosis not present

## 2017-06-07 DIAGNOSIS — E1165 Type 2 diabetes mellitus with hyperglycemia: Principal | ICD-10-CM

## 2017-06-07 DIAGNOSIS — H6123 Impacted cerumen, bilateral: Secondary | ICD-10-CM | POA: Diagnosis not present

## 2017-06-07 LAB — GLUCOSE, CAPILLARY: Glucose-Capillary: 126 mg/dL — ABNORMAL HIGH (ref 65–99)

## 2017-06-07 NOTE — Assessment & Plan Note (Signed)
Lab Results  Component Value Date   HGBA1C 10.0 04/26/2017   HGBA1C 7.1 02/01/2017   HGBA1C 7.2 10/26/2016     Assessment: Diabetes control:  above goal Comments: Current medication regimen includes Lantus 54 units at bedtime, Humalog 10 units with lunch and 20 units with dinner, Trulicity 5.85 mg weekly, and metformin 1000 mg twice daily. Patient reports taking Humalog 10 units with breakfast and 20 units with dinner. She is taking the remainder of her medications as instructed. CBG 126 at this visit. Review of meter showing average CBG 265, highest 468, and lowest 100. Although, the readings are not likely accurate as patient checks her blood sugars after eating in the afternoon/ night. States she does not eat her first meal until 3 PM every day and usually snacks at night. I explained to the patient she will benefit from meeting with [name] to come up with a good meal plan but she again adamantly refused at this visit. States she has an upcoming appointment with Dr. Katy Fitch (ophthalmology).  Plan: Medications:  continue current medications Home glucose monitoring: Frequency:  3 times a day Timing:  emphasized checking her CBG before meals Instruction/counseling given: reminded to get eye exam, reminded to bring blood glucose meter & log to each visit, reminded to bring medications to each visit, discussed foot care, discussed the need for weight loss, discussed diet and discussed sick day management Educational resources provided: brochure (denies need ) Self management tools provided: copy of home glucose meter download Other plans:  -Foot exam done at this visit
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
## 2017-06-07 NOTE — Assessment & Plan Note (Signed)
History of present illness Patient states her ears are "clogged up"and is concerned it might affect her ability to hear. Denies having any ear pain or discharge. States sometimes uses Q-tips to clean her ears.  Assessment Cerumen impaction noted in ear canals bilaterally. Hearing grossly normal. After ears were irrigated, tympanic membrane could be visualized bilaterally and appearred normal. There was a very small area of mild irritation in the right ear canal, likely secondary to removal of dried cerumen. No discharge or gross bleeding.  Plan -Refrain from using Q-tips -Advised her to gently wipe her ears with a clean towel after bathing every day. She may also try using over-the-counter Debrox if needed.

## 2017-06-07 NOTE — Assessment & Plan Note (Addendum)
History of present illness Patient reports being more forgetful for the past few months. States she is doing things such as checking her stove twice. Also reports blanking out on occasion.   Assessment Unclear etiology of forgetfulness. CBG 126 at this visit and meter is not showing any low values. TSH checked in February 2018 was normal. B12 and folate checked in February 2015 were normal.  Plan -Recheck B12 and folate -Advised her to exercise regularly to improve her memory and concentration.  Addendum 06/08/2017 at 2:59 PM: B12 and folate levels normal. Discussed results with the patient over the phone. -Consider doing a Mini-Mental status exam at her next visit to rule out early onset dementia

## 2017-06-07 NOTE — Progress Notes (Signed)
   CC: Patient is complaining of forgetfulness and clogged ears. Hypertension and diabetes were also discussed during this visit.  HPI:  Susan Holmes is a 61 y.o. female with a past medical history of conditions listed below presenting to the clinic complaining of forgetfulness and clogged ears. Hypertension and diabetes were also discussed during this visit. Please see problem based charting for the status of the patient's current and chronic medical conditions.   Past Medical History:  Diagnosis Date  . Abnormal stress test   . Anxiety   . Arthritis   . Congenital blindness    L eye  . Depression   . Diabetes mellitus, type II ([place])   . Glaucoma   . Hepatitis B infection pt unsure   resolved  . HTN (hypertension)   . Hyperlipidemia   . Stroke ([place])   . Tobacco abuse    .5 PPD smoker    Review of Systems: Pertinent positives mentioned in HPI. Remainder of all ROS negative.   Physical Exam:  Vitals:   06/07/17 1506 06/07/17 1540  BP: (!) 150/88 (!) 144/90  Pulse: 83 83  Temp: 98.2 F (36.8 C)   TempSrc: Oral   SpO2: 99%   Weight: 192 lb 12.8 oz (87.5 kg)   Height: 5\' 7"  (1.702 m)    Physical Exam  Constitutional: She is oriented to person, place, and time. She appears well-developed and well-nourished. No distress.  HENT:  Head: Normocephalic and atraumatic.  Ear canal impacted with cerumen bilaterally. After ears were irrigated, tympanic membrane was visible and appeared pearly white with light reflex present bilaterally. No bulging of tympanic membrane bilaterally. A very small area of mild irritation noted in the right ear canal. No discharge or gross bleeding noted.  Eyes: Right eye exhibits no discharge. Left eye exhibits no discharge.  Cardiovascular: Normal rate, regular rhythm and intact distal pulses.   Pulmonary/Chest: Effort normal and breath sounds normal. No respiratory distress. She has no wheezes. She has no rales.  Abdominal: Soft. Bowel sounds  are normal. She exhibits no distension. There is no tenderness.  Musculoskeletal: She exhibits no edema.  Neurological: She is alert and oriented to person, place, and time.  Skin: Skin is warm and dry.    Assessment & Plan:   See Encounters Tab for problem based charting.  Patient discussed with Dr. Lynnae January

## 2017-06-07 NOTE — Patient Instructions (Signed)
Ms. Kiernan it was nice seeing you today.  Continue taking your diabetes and blood pressure medicines as before.  I encourage you to eat healthy and exercise.  Please return for a follow-up in 1 month. Bring your meter to the visit.

## 2017-06-08 LAB — VITAMIN B12: Vitamin B-12: 376 pg/mL (ref 232–1245)

## 2017-06-08 LAB — FOLATE RBC
Folate, Hemolysate: 264.7 ng/mL
Folate, RBC: 668 ng/mL (ref >498)
Hematocrit: 39.6 % (ref 34.0–46.6)

## 2017-06-09 ENCOUNTER — Ambulatory Visit: Payer: Medicare Other | Admitting: Cardiovascular Disease

## 2017-06-09 DIAGNOSIS — H401113 Primary open-angle glaucoma, right eye, severe stage: Secondary | ICD-10-CM | POA: Diagnosis not present

## 2017-06-10 NOTE — Progress Notes (Signed)
Internal Medicine Clinic Attending  Case discussed with Dr. Rathoreat the time of the visit. We reviewed the resident's history and exam and pertinent patient test results. I agree with the assessment, diagnosis, and plan of care documented in the resident's note.  

## 2017-06-15 ENCOUNTER — Ambulatory Visit: Payer: Medicare Other | Admitting: Cardiovascular Disease

## 2017-06-26 NOTE — Progress Notes (Signed)
Cardiology Office Note   Date:  06/27/2017   ID:  Susan Holmes, DOB 1956-01-06, MRN [id number]  PCP:  Shela Leff, MD  Cardiologist:   Skeet Latch, MD   Chief Complaint  Holmes presents with  . Follow-up     History of Present Illness: Susan Holmes is a 61 y.o. female with diabetes mellitus type 2, hypertension, hyperlipidemia, carotid stenosis, stroke and tobacco abuse who presents for follow up. Susan Holmes was seen 08/13/16 with chest pain. She  was referred for [facility] 08/24/16 that revealed LVEF 52% and was negative for ischemia. She was also noted to have a history of ischemic stroke and had not undergone TEE. She had a TEE on 08/17/16 that was negative for thrombus and did not reveal an ASD or PFO.  Carotid Doppler showed moderate L ECA stenosis and mild L ICA stenosis.  She was started on aspirin and atorvastatin 80 mg but this was stopped due to myalgias.  She did report frequent palpitations and had a 30 day event monitor placed that revealed occasional PACs and PVCs.  Susan Holmes previously had an echo 09/2014 that revealed LVEF 40-45% with diffuse hypokinesis and trace MR.  She had a nuclear stress test that showed LVEF 38% with a fixed apical defect.  She had a LHC at that time without obstructive coronary disease and her EF on left ventriculography was 55% and LVEDP was 10 mmHg.  She was referred to pulmonology and saw Dr. Vaughan Browner.  PFTs did not show clear obstruction.  She was started on Symbicort 04/2017.  Susan Holmes Has not noticed any improvement in her breathing since starting Symbicort. She continues to have exertional shortness of breath. She reports intermittent episodes of sharp chest pain that last for a few minutes at a time. She continues to smoke less than a half a pack of cigarettes daily. She is unable to take Chantix.  Her main complaint today is that she has not been sleeping well. She has difficulty falling asleep And staying asleep. She's  tried taking melatonin. She has a nighttime routine and does not use caffeine in Susan evenings.  She does not have Susan television on when trying to sleep.  She denies any new stressors.  She has not been exercising regularly. She attributes this to laziness. She denies lower extremity edema, orthopnea, or PND.   Past Medical History:  Diagnosis Date  . Abnormal stress test   . Anxiety   . Arthritis   . Congenital blindness    L eye  . Depression   . Diabetes mellitus, type II ([place])   . Glaucoma   . Hepatitis B infection pt unsure   resolved  . HTN (hypertension)   . Hyperlipidemia   . Stroke ([place])   . Tobacco abuse    .5 PPD smoker    Past Surgical History:  Procedure Laterality Date  . ABDOMINAL HYSTERECTOMY    . LEFT HEART CATHETERIZATION WITH CORONARY ANGIOGRAM N/A 10/09/2014   Procedure: LEFT HEART CATHETERIZATION WITH CORONARY ANGIOGRAM;  Surgeon: Troy Sine, MD;  Location: [facility] CATH LAB;  Service: Cardiovascular;  Laterality: N/A;  . TEE WITHOUT CARDIOVERSION N/A 08/17/2016   Procedure: TRANSESOPHAGEAL ECHOCARDIOGRAM (TEE);  Surgeon: Sueanne Margarita, MD;  Location[facility] ENDOSCOPY;  Service: Cardiovascular;  Laterality: N/A;    Current Outpatient Prescriptions  Medication Sig Dispense Refill  . ACCU-CHEK FASTCLIX LANCETS MISC Check blood sugar 3x a day as instructed dx cod 250.00 insulin requiring  102 each 5  . aspirin 81 MG EC tablet TAKE 81 MG BY MOUTH EVERY DAY 30 tablet 0  . BESIVANCE 0.6 % SUSP Place 1 drop into Susan right eye 2 (two) times daily.  1  . Blood Glucose Calibration (ACCU-CHEK SMARTVIEW CONTROL) LIQD     . Blood Glucose Monitoring Suppl (ACCU-CHEK AVIVA PLUS) W/DEVICE KIT Use to check blood sugar 3 to 4 times daily. diag code 250.62. Insulin dependent 1 kit 0  . budesonide-formoterol (SYMBICORT) 160-4.5 MCG/ACT inhaler Inhale 2 puffs into Susan lungs 2 (two) times daily. 1 Inhaler 6  . carvedilol (COREG) 12.5 MG tablet TAKE 1 TABLET(12.5 MG) BY MOUTH TWICE  DAILY 60 tablet 6  . diclofenac sodium (VOLTAREN) 1 % GEL Apply 2 g topically daily as needed.    . docusate sodium (COLACE) 100 MG capsule Take 1 capsule (100 mg total) by mouth 2 (two) times daily. 10 capsule 0  . doxepin (SINEQUAN) 25 MG capsule Take 25 mg by mouth at bedtime.    . DULoxetine (CYMBALTA) 60 MG capsule Take 1 capsule (60 mg total) by mouth daily. 90 capsule 1  . DUREZOL 0.05 % EMUL Place 1 drop into Susan right eye 2 (two) times daily.  1  . erythromycin ophthalmic ointment Place 1 application into Susan right eye daily as needed for irritation.  0  . famotidine (PEPCID) 20 MG tablet Take 1 tablet (20 mg total) by mouth daily as needed for heartburn or indigestion. 90 tablet 0  . glucose blood (ACCU-CHEK AVIVA PLUS) test strip Use to test blood sugar 3 times daily. diag code E11.41. Insulin dependent 200 each 3  . Insulin Glargine (LANTUS SOLOSTAR) 100 UNIT/ML Solostar Pen Inject 54 Units into Susan skin at bedtime. 15 mL 6  . insulin lispro (HUMALOG KWIKPEN) 100 UNIT/ML KiwkPen Inject 10 units with lunch and 20 units with supper. 15 mL 6  . Insulin Pen Needle (VALUMARK PEN NEEDLES) 31G X 8 MM MISC 1 each by Does not apply route daily. 100 each 3  . Lancet Devices (ADJUSTABLE LANCING DEVICE) MISC 1 each.     . losartan (COZAAR) 50 MG tablet TAKE 1 TABLET(50 MG) BY MOUTH TWICE DAILY 180 tablet 3  . metFORMIN (GLUCOPHAGE) 1000 MG tablet TAKE 1 TABLET BY MOUTH TWICE DAILY 180 tablet 3  . pantoprazole (PROTONIX) 40 MG tablet Take 1 tablet (40 mg total) by mouth daily. 90 tablet 0  . pravastatin (PRAVACHOL) 10 MG tablet 1/2 TABLET BY  MOUTH DAILY 15 tablet 5  . timolol (TIMOPTIC) 0.5 % ophthalmic solution Place 1 drop into both eyes as directed.    . traZODone (DESYREL) 100 MG tablet Take 2 tablets by mouth at bedtime.     . TRULICITY 0.26 VZ/8.5YI SOPN INJECT 0.75 MG UNDER Susan SKIN ONCE A WEEK( DISCONTINUE BYETTA) 2 mL 3   No current facility-administered medications for this visit.      Allergies:   Ace inhibitors; Chantix [varenicline]; and Lipitor [atorvastatin]    Social History:  Susan Holmes  reports that she has been smoking Cigarettes.  She has a 5.00 pack-year smoking history. Her smokeless tobacco use includes Chew. She reports that she does not drink alcohol or use drugs.   Family History:  Susan Holmes's family history includes Aneurysm in her brother and sister; CVA in her brother, maternal grandfather, maternal grandmother, mother, and sister; Heart disease (age of onset: 19) in her father and mother; Hypertension in her mother.    ROS:  Please  see Susan history of present illness.   Otherwise, review of systems are positive for sinus congestion, clear phlegm.   All other systems are reviewed and negative.    PHYSICAL EXAM: VS:  BP 132/84   Pulse 74   Ht '5\' 7"'  (1.702 m)   Wt 87.2 kg (192 lb 3.2 oz)   BMI 30.10 kg/m  , BMI Body mass index is 30.1 kg/m. GENERAL:  Well appearing.  No acute distress  HEENT:  Pupils equal round and reactive, fundi not visualized, oral mucosa unremarkable NECK:  No jugular venous distention, waveform within normal limits, carotid upstroke brisk and symmetric, no bruits LUNGS:  Clear to auscultation bilaterally.  No crackles, rhonchi, or wheezes. HEART:  Mostly regular with occasional ectopy.  PMI not displaced or sustained,S1 and S2 within normal limits, no S3, no S4, no clicks, no rubs, no murmurs ABD:  Flat, positive bowel sounds normal in frequency in pitch, no bruits, no rebound, no guarding, no midline pulsatile mass, no hepatomegaly, no splenomegaly EXT:  2 plus pulses throughout, no edema, no cyanosis no clubbing SKIN:  No rashes no nodules NEURO:  Cranial nerves II through XII grossly intact, motor grossly intact throughout PSYCH:  Cognitively intact, oriented to person place and time   EKG:  EKG is not ordered today. Susan ekg ordered 08/13/16 demonstrates sinus rhythm rate 83 bpm.  Non-specific ST-T changes. Sinus  rhythm. Rate 76 bpm.  30 Day Event Monitor 09/01/16: Quality: Fair.  Baseline artifact. Average heart rate: 80 bpm Pauses >2.5 seconds: 0 Occasional PACs and PVCs  TEE 08/17/16: Study Conclusions  - Left ventricle: Systolic function was normal. Susan estimated   ejection fraction was in Susan range of 50% to 55%. Wall motion was   normal; there were no regional wall motion abnormalities. - Mitral valve: There was mild regurgitation. - Left atrium: No evidence of thrombus in Susan atrial cavity or   appendage. No evidence of thrombus in Susan atrial cavity or   appendage. - Right atrium: No evidence of thrombus in Susan atrial cavity or   appendage. - Tricuspid valve: There was trivial regurgitation. - Pulmonic valve: No evidence of vegetation. There was trivial   regurgitation.  Lexiscan Myoview 08/24/16:  Susan left ventricular ejection fraction is normal (55-65%).  Susan computer generated EF is 52%. Visually, Susan EF is closer to 60% and is normal.  There was no ST segment deviation noted during stress.  Susan study is normal.  This is a low risk study.  Echo 09/2014: LVEF 40-45% with diffuse hypokinesis and trace MR.   Nuclear stress test 09/2014: LVEF 38% with a fixed apical defect. LHC 10/2014: No obstructive coronary disease.  EF on left ventriculography was 55% and LVEDP of 10 mmHg.    Carotid Doppler 07/24/16: >50% L ECA stenosis; 1-39% L ICA stenosis  Echo 07/24/16: LVEF 55-60%.  Restrictive physiology was noted.   Recent Labs: 08/25/2016: ALT 16; BUN 12; Creat 0.93; Potassium 4.7; Sodium 140 02/01/2017: TSH 0.695 05/17/2017: Hemoglobin 12.2; Platelets 262.0    Lipid Panel    Component Value Date/Time   CHOL 142 04/26/2017 1403   TRIG 154 (H) 04/26/2017 1403   HDL 42 (L) 04/26/2017 1403   CHOLHDL 3.4 04/26/2017 1403   VLDL 31 (H) 04/26/2017 1403   LDLCALC 69 04/26/2017 1403   LDLDIRECT 183 (H) 03/01/2014 0500      Wt Readings from Last 3 Encounters:  06/27/17 87.2 kg  (192 lb 3.2 oz)  06/07/17 87.5 kg (  192 lb 12.8 oz)  05/17/17 87.1 kg (192 lb)      ASSESSMENT AND PLAN:  # Hypertension: # PVCs: BP is Much better-controlled with slightly above goal. I encouraged her to increase her exercise rather than titrating her antihypertensives. Continue carvedilol and losartan.   # Hyperlipidemia: # Myalgias: Susan Holmes didn't tolerate atorvastatin 2/2 myalgias.  She is doing well and pravastatin. LDL 69 04/2017.   # Tobacco abuse: Susan Holmes was again encouraged to continue trying to quit smoking.  She cannot use Chantix 2/2 side effects.  Patches have been helpful for her in Susan past. She is not ready to quit at this time.  # Stroke:  # Carotid stenosis: Continue aspirin and pravavastatin.    # Shortness of breath: Susan Holmes continues to report shortness of breath.  She is euvolemic and stress was negative for ischemia.  She continues to smoke.  Symptoms have not improved on Symbicort.   Encouraged exercise as above.      Current medicines are reviewed at length with Susan Holmes today.  Susan Holmes does not have concerns regarding medicines.  Susan following changes have been made:  no change  Labs/ tests ordered today include:   No orders of Susan defined types were placed in this encounter.    Disposition:   FU with Conlin Brahm C. Oval Linsey, MD, [facility] in 6 months   This note was written with Susan assistance of speech recognition software.  Please excuse any transcriptional errors.  Signed, Yuki Brunsman C. Oval Linsey, MD, [facility]  06/27/2017 10:03 AM    [place]

## 2017-06-27 ENCOUNTER — Encounter: Payer: Self-pay | Admitting: Cardiovascular Disease

## 2017-06-27 ENCOUNTER — Ambulatory Visit (INDEPENDENT_AMBULATORY_CARE_PROVIDER_SITE_OTHER): Payer: Medicare Other | Admitting: Cardiovascular Disease

## 2017-06-27 VITALS — BP 132/84 | HR 74 | Ht 67.0 in | Wt 192.2 lb

## 2017-06-27 DIAGNOSIS — I10 Essential (primary) hypertension: Secondary | ICD-10-CM

## 2017-06-27 DIAGNOSIS — I493 Ventricular premature depolarization: Secondary | ICD-10-CM

## 2017-06-27 DIAGNOSIS — I639 Cerebral infarction, unspecified: Secondary | ICD-10-CM

## 2017-06-27 DIAGNOSIS — R0602 Shortness of breath: Secondary | ICD-10-CM | POA: Diagnosis not present

## 2017-06-27 DIAGNOSIS — Z720 Tobacco use: Secondary | ICD-10-CM

## 2017-06-27 DIAGNOSIS — E7800 Pure hypercholesterolemia, unspecified: Secondary | ICD-10-CM | POA: Diagnosis not present

## 2017-06-27 NOTE — Patient Instructions (Signed)
Medication Instructions:  Your physician recommends that you continue on your current medications as directed. Please refer to the Current Medication list given to you today.  Labwork: none  Testing/Procedures: none  Follow-Up: Your physician wants you to follow-up in: 6 month ov You will receive a reminder letter in the mail two months in advance. If you don't receive a letter, please call our office to schedule the follow-up appointment.  Any Other Special Instructions Will Be Listed Below (If Applicable). YOU NEED TO TRY TO EXERCISE 3 TO 4 DAYS A WEEK   If you need a refill on your cardiac medications before your next appointment, please call your pharmacy.

## 2017-07-05 ENCOUNTER — Ambulatory Visit (INDEPENDENT_AMBULATORY_CARE_PROVIDER_SITE_OTHER): Payer: Medicare Other | Admitting: Internal Medicine

## 2017-07-05 ENCOUNTER — Encounter: Payer: Self-pay | Admitting: Internal Medicine

## 2017-07-05 ENCOUNTER — Other Ambulatory Visit: Payer: Self-pay | Admitting: Internal Medicine

## 2017-07-05 VITALS — BP 150/93 | HR 84 | Temp 98.6°F | Wt 194.0 lb

## 2017-07-05 DIAGNOSIS — F329 Major depressive disorder, single episode, unspecified: Secondary | ICD-10-CM

## 2017-07-05 DIAGNOSIS — Z79899 Other long term (current) drug therapy: Secondary | ICD-10-CM | POA: Diagnosis not present

## 2017-07-05 DIAGNOSIS — Z794 Long term (current) use of insulin: Secondary | ICD-10-CM | POA: Diagnosis not present

## 2017-07-05 DIAGNOSIS — E1140 Type 2 diabetes mellitus with diabetic neuropathy, unspecified: Secondary | ICD-10-CM

## 2017-07-05 DIAGNOSIS — I10 Essential (primary) hypertension: Secondary | ICD-10-CM

## 2017-07-05 DIAGNOSIS — F17210 Nicotine dependence, cigarettes, uncomplicated: Secondary | ICD-10-CM

## 2017-07-05 DIAGNOSIS — F32A Depression, unspecified: Secondary | ICD-10-CM

## 2017-07-05 DIAGNOSIS — IMO0002 Reserved for concepts with insufficient information to code with codable children: Secondary | ICD-10-CM

## 2017-07-05 DIAGNOSIS — E1165 Type 2 diabetes mellitus with hyperglycemia: Principal | ICD-10-CM

## 2017-07-05 MED ORDER — LOSARTAN POTASSIUM-HCTZ 100-12.5 MG PO TABS: 1.0000 | ORAL_TABLET | Freq: Every day | ORAL | 0 refills | Status: DC

## 2017-07-05 MED ORDER — DULOXETINE HCL 30 MG PO CPEP: 30.0000 mg | ORAL_CAPSULE | Freq: Every day | ORAL | 0 refills | Status: DC

## 2017-07-05 NOTE — Assessment & Plan Note (Signed)
Lab Results  Component Value Date   HGBA1C 10.0 04/26/2017   HGBA1C 7.1 02/01/2017   HGBA1C 7.2 10/26/2016     Assessment: Diabetes control:  above goal Comments: Patient has poorly controlled diabetes. Current medication regimen includes Lantus 54 units at bedtime, Humalog 10 units with breakfast and 20 units for dinner, Trulicity 0.56 mg weekly, and metformin 1000 g twice daily. She reports compliance with her medications. Denies having any hypoglycemic symptoms. She did not bring her meter to this visit and was noted to be drinking an entire bottle of regular Pepsi. Explained to the patient it is difficult to make changes in her medications without reviewing her meter.  Plan: Medications:  continue current medications. Emphasized the importance of exercise, reducing caloric intake, and avoiding sweetened beverages. Discussed continuous glucose monitoring and patient seemed interested. I have emailed [name]. Home glucose monitoring: Frequency:  3 times a day  Timing:  before meals Instruction/counseling given: reminded to get eye exam, reminded to bring blood glucose meter & log to each visit, reminded to bring medications to each visit, discussed foot care, discussed the need for weight loss, discussed diet and discussed sick day management Other plans:  -Return to the clinic in 2 weeks with her meter

## 2017-07-05 NOTE — Progress Notes (Signed)
   CC: Patient is here for a diabetes follow-up. Hypertension and depression were also discussed during this visit.  HPI:  [name] is a 61 y.o. female with a past medical history of conditions listed below presenting to the clinic for a diabetes follow-up. Hypertension and depression were also discussed during this visit. Please see problem based charting for the status of the patient's current and chronic medical conditions.   Past Medical History:  Diagnosis Date  . Abnormal stress test   . Anxiety   . Arthritis   . Congenital blindness    L eye  . Depression   . Diabetes mellitus, type II ([place])   . Glaucoma   . Hepatitis B infection pt unsure   resolved  . HTN (hypertension)   . Hyperlipidemia   . Stroke ([place])   . Tobacco abuse    .5 PPD smoker   Review of Systems: Pertinent positives mentioned in HPI. Remainder of all ROS negative.   Physical Exam:  Vitals:   07/05/17 1328 07/05/17 1426  BP: (!) 156/90 (!) 150/93  Pulse: 84   Temp: 98.6 F (37 C)   TempSrc: Oral   SpO2: 100%   Weight: 194 lb (88 kg)    Physical Exam  Constitutional: She is oriented to person, place, and time. She appears well-developed and well-nourished. No distress.  HENT:  Head: Normocephalic and atraumatic.  Eyes: Right eye exhibits no discharge. Left eye exhibits no discharge.  Cardiovascular: Normal rate, regular rhythm and intact distal pulses.   Pulmonary/Chest: Effort normal and breath sounds normal. No respiratory distress. She has no wheezes. She has no rales.  Abdominal: Soft. Bowel sounds are normal. She exhibits no distension. There is no tenderness.  Musculoskeletal: She exhibits no edema.  Neurological: She is alert and oriented to person, place, and time.  Skin: Skin is warm and dry.  Psychiatric:  Blunted affect    Assessment & Plan:   See Encounters Tab for problem based charting.  Patient discussed with Dr. Dareen Piano

## 2017-07-05 NOTE — Assessment & Plan Note (Signed)
BP Readings from Last 3 Encounters:  07/05/17 (!) 150/93  06/27/17 132/84  06/07/17 (!) 144/90    Lab Results  Component Value Date   NA 140 08/25/2016   K 4.7 08/25/2016   CREATININE 0.93 08/25/2016    Assessment: Blood pressure control:  above goal Comments: Her blood pressure continues to be elevated at this visit. Initial reading 156/90 and repeat 150/93. Current medication regimen includes losartan 50 mg twice daily and carvedilol 12.5 mg twice daily. Patient reports compliance with her medications.  Plan: Medications:  Changed to losartan-hydrochlorothiazide 100-12.5 mg daily. Continue carvedilol 12.5 mg twice daily. Educational resources provided:   Educated patient about healthy eating and exercise. Emphasized the importance of weight loss.  Other plans:  Advised her to return to the clinic in 2 weeks. Please check BMP at that time to assess renal function.

## 2017-07-05 NOTE — Patient Instructions (Signed)
Susan Holmes it was nice seeing you today.   For high blood pressure:  STOP taking losartan only pill   Start taking losartan-hydrochlorothiazide 100-12.5 mg once daily  Continue taking carvedilol as before   For diabetes:  Continue taking your diabetes medications as before   For depression:  Start taking Cymbalta 30 mg daily  Continue taking trazodone as before    Return for a follow-up visit in 2 weeks with your meter

## 2017-07-05 NOTE — Assessment & Plan Note (Signed)
Assessment PHQ-9 score 19 of this visit consistent with moderately severe depression. Patient denies having any suicidal or homicidal ideation. She is currently taking trazodone 200 mg at bedtime. She was prescribed Cymbalta previously for her depression and chronic pains. Patient does not recall taking this medication.  Plan -Restart Cymbalta at a lower dose (30 mg daily). Titrate dose at future visit based on response. -Continue trazodone 200 mg at bedtime

## 2017-07-05 NOTE — Progress Notes (Signed)
Internal Medicine Clinic Attending  Case discussed with Dr. Rathoreat the time of the visit. We reviewed the resident's history and exam and pertinent patient test results. I agree with the assessment, diagnosis, and plan of care documented in the resident's note.  

## 2017-07-13 ENCOUNTER — Telehealth: Payer: Self-pay | Admitting: Internal Medicine

## 2017-07-13 NOTE — Telephone Encounter (Signed)
Resolved, called pharm it is ready for pick up

## 2017-07-13 NOTE — Telephone Encounter (Signed)
CALLED YESTERDAY FOR REFILLS, STILL NOTHING AT [facility] [address]

## 2017-07-19 ENCOUNTER — Telehealth: Payer: Self-pay

## 2017-07-19 NOTE — Telephone Encounter (Signed)
Requesting to speak with a nurse about meds. Please call pt back.  

## 2017-07-19 NOTE — Telephone Encounter (Signed)
Helped her identify hyzaar, she is happy, call ended

## 2017-07-21 ENCOUNTER — Encounter: Payer: Self-pay | Admitting: Pharmacist

## 2017-07-21 ENCOUNTER — Ambulatory Visit (INDEPENDENT_AMBULATORY_CARE_PROVIDER_SITE_OTHER): Payer: Medicare Other | Admitting: Pharmacist

## 2017-07-21 DIAGNOSIS — IMO0002 Reserved for concepts with insufficient information to code with codable children: Secondary | ICD-10-CM

## 2017-07-21 DIAGNOSIS — E1165 Type 2 diabetes mellitus with hyperglycemia: Secondary | ICD-10-CM

## 2017-07-21 DIAGNOSIS — Z719 Counseling, unspecified: Secondary | ICD-10-CM

## 2017-07-21 DIAGNOSIS — Z7189 Other specified counseling: Secondary | ICD-10-CM | POA: Diagnosis not present

## 2017-07-21 DIAGNOSIS — Z794 Long term (current) use of insulin: Secondary | ICD-10-CM

## 2017-07-21 DIAGNOSIS — E1140 Type 2 diabetes mellitus with diabetic neuropathy, unspecified: Secondary | ICD-10-CM

## 2017-07-21 NOTE — Progress Notes (Signed)
[employer] Libre Pro CGM sensor placed and started. Patient was educated about wearing sensor, keeping food, activity and medication log and when to call office. Follow up was arranged with the patient. Patient verbalized understanding by repeat back.

## 2017-07-21 NOTE — Progress Notes (Signed)
Reviewed and agree. Thanks Drg

## 2017-07-21 NOTE — Patient Instructions (Signed)
Patient educated about medication as defined in this encounter and verbalized understanding by repeating back instructions provided.   

## 2017-07-28 ENCOUNTER — Ambulatory Visit (INDEPENDENT_AMBULATORY_CARE_PROVIDER_SITE_OTHER): Payer: Medicare Other | Admitting: Internal Medicine

## 2017-07-28 ENCOUNTER — Encounter: Payer: Self-pay | Admitting: Internal Medicine

## 2017-07-28 ENCOUNTER — Ambulatory Visit: Payer: Medicare Other | Admitting: Pharmacist

## 2017-07-28 VITALS — BP 135/86 | HR 87 | Temp 98.3°F | Ht 67.0 in | Wt 192.2 lb

## 2017-07-28 DIAGNOSIS — R05 Cough: Secondary | ICD-10-CM | POA: Diagnosis not present

## 2017-07-28 DIAGNOSIS — Z794 Long term (current) use of insulin: Secondary | ICD-10-CM | POA: Diagnosis not present

## 2017-07-28 DIAGNOSIS — E119 Type 2 diabetes mellitus without complications: Secondary | ICD-10-CM | POA: Diagnosis not present

## 2017-07-28 DIAGNOSIS — IMO0002 Reserved for concepts with insufficient information to code with codable children: Secondary | ICD-10-CM

## 2017-07-28 DIAGNOSIS — E1165 Type 2 diabetes mellitus with hyperglycemia: Secondary | ICD-10-CM

## 2017-07-28 DIAGNOSIS — R059 Cough, unspecified: Secondary | ICD-10-CM

## 2017-07-28 DIAGNOSIS — F17210 Nicotine dependence, cigarettes, uncomplicated: Secondary | ICD-10-CM

## 2017-07-28 DIAGNOSIS — E1140 Type 2 diabetes mellitus with diabetic neuropathy, unspecified: Secondary | ICD-10-CM

## 2017-07-28 DIAGNOSIS — Z79899 Other long term (current) drug therapy: Secondary | ICD-10-CM

## 2017-07-28 MED ORDER — FLUTICASONE PROPIONATE 50 MCG/ACT NA SUSP: 2.0000 | Freq: Every day | NASAL | 2 refills | Status: DC

## 2017-07-28 MED ORDER — SITAGLIPTIN PHOS-METFORMIN HCL 50-1000 MG PO TABS: 1.0000 | ORAL_TABLET | Freq: Two times a day (BID) | ORAL | 1 refills | Status: DC

## 2017-07-28 MED ORDER — DULAGLUTIDE 0.75 MG/0.5ML ~~LOC~~ SOAJ: SUBCUTANEOUS | 3 refills | Status: DC

## 2017-07-28 MED ORDER — DM-GUAIFENESIN ER 30-600 MG PO TB12: 1.0000 | ORAL_TABLET | Freq: Two times a day (BID) | ORAL | 0 refills | Status: DC

## 2017-07-28 MED ORDER — LORATADINE 10 MG PO TABS: 10.0000 mg | ORAL_TABLET | Freq: Every day | ORAL | 0 refills | Status: DC

## 2017-07-28 MED ORDER — INSULIN GLARGINE 100 UNIT/ML SOLOSTAR PEN: 60.0000 [IU] | PEN_INJECTOR | Freq: Every day | SUBCUTANEOUS | 3 refills | Status: DC

## 2017-07-28 NOTE — Progress Notes (Signed)
   CC: Patient is here for a follow-up of her diabetes. She is also complaining of a cough.  HPI:  Susan Holmes is a 61 y.o. female with a past medical history of conditions listed below presenting to the clinic for a follow-up of her diabetes. She is also complaining of a cough. Please see problem based charting for the status of the patient's current and chronic medical conditions.   Past Medical History:  Diagnosis Date  . Abnormal stress test   . Anxiety   . Arthritis   . Congenital blindness    L eye  . Depression   . Diabetes mellitus, type II ([place])   . Glaucoma   . Hepatitis B infection pt unsure   resolved  . HTN (hypertension)   . Hyperlipidemia   . Stroke ([place])   . Tobacco abuse    .5 PPD smoker   Review of Systems: Pertinent positives mentioned in HPI. Remainder of all ROS negative.   Physical Exam:  Vitals:   07/28/17 1113  BP: 135/86  Pulse: 87  Temp: 98.3 F (36.8 C)  TempSrc: Oral  SpO2: 100%  Weight: 192 lb 3.2 oz (87.2 kg)  Height: 5\' 7"  (1.702 m)   Physical Exam  Constitutional: She is oriented to person, place, and time. She appears well-developed and well-nourished. No distress.  HENT:  Head: Normocephalic and atraumatic.  Eyes: Right eye exhibits no discharge. Left eye exhibits no discharge.  Cardiovascular: Normal rate, regular rhythm and intact distal pulses.   Pulmonary/Chest: Effort normal and breath sounds normal. No respiratory distress. She has no wheezes. She has no rales.  Abdominal: Soft. Bowel sounds are normal. She exhibits no distension. There is no tenderness.  Musculoskeletal: She exhibits no edema.  Neurological: She is alert and oriented to person, place, and time.  Skin: Skin is warm and dry.    Assessment & Plan:   See Encounters Tab for problem based charting.  Patient discussed with Dr. Dareen Piano

## 2017-07-28 NOTE — Assessment & Plan Note (Signed)
Susan Holmes wore the CGM for 5 days. The average reading was 399, % time in target was 0, % time below target was 0, and % time above target was 100. Intervention will be to increase dose of Lantus to 60 units at bedtime. Stop metformin and instead start the patient on Janumet 50-1000 mg twice daily. Continue Humalog 10 units at breakfast and 20 units with dinner. Continue to Trulicity 2.83 milligrams weekly. The patient will be scheduled to see me for a final appointment. Patient's home diet log reveals she is eating a lot of foods with a high glucose content and is not eating her meals consistently. She again declines to see [name] for advice on nutrition. Referral to endocrinology has been placed.

## 2017-07-28 NOTE — Progress Notes (Signed)
Co-visit 

## 2017-07-28 NOTE — Patient Instructions (Addendum)
Susan Holmes it was nice seeing you today.  For diabetes:  Stop taking metformin  Start taking Janumet  Use Lantus 60 units at bedtime  Continue using Humalog and Trulicity as before  I have referred you to endocrinology.    For cough:  Start taking Claritin and Mucinex DM as instructed  Use Flonase nasal spray as instructed  I would like to encourage you to cut down on your smoking.  Please go to your appointment with pulmonology on 08/30/2017 at 9:30 AM.

## 2017-07-28 NOTE — Progress Notes (Signed)
CGM results: 4-day average BG 399, above 180 mg/dL 100% of the time CGM sensor has fallen off. Contacted [employer] representative to order a replacement sensor. Appointment scheduled with patient 08/03/17 to place another sensor.

## 2017-07-28 NOTE — Assessment & Plan Note (Signed)
History of present illness Patient reports having a cough productive of clear sputum for the past 2 weeks. Also reports having rhinorrhea and a sensation of "something running in my throat." Denies having any fevers, chills, sore throat, shortness of breath, or wheezing. Denies having any lower extremity edema, orthopnea, or paroxysmal nocturnal dyspnea. States her chest feels sore from coughing. Reports smoking less than one half pack per day and is not interested in quitting at this time. States her GERD is currently well controlled with Protonix and Pepcid.  Assessment Cough likely secondary to postnasal drip. Acute bronchitis is also on the differential. Patient was seen by pulmonology in May 2018 and diagnosed with reactive airway disease based on her PFTs. Lungs clear on exam at present.  Plan -Claritin for allergies -Flonase nasal spray -Mucinex DM -Continue Symbicort -Continue treatment for GERD -Advised her to go to her appointment with pulmonology on 08/30/2017 at 9:30 AM.

## 2017-07-29 NOTE — Progress Notes (Signed)
Internal Medicine Clinic Attending  Case discussed with Dr. Marlowe Sax at the time of the visit.  We reviewed the resident's history and exam and pertinent patient test results.  I agree with the assessment, diagnosis, and plan of care documented in the resident's note. I also personally reviewed the CBG log and noted that patient had no hypoglycemic episodes and was above target in 100% of the readings. I agree with the addition of januvia to her regimen and the referral to endocrinology.

## 2017-07-30 IMAGING — NM NM MISC PROCEDURE
6 series · 36 of 36 positions shown · non-contrast
Comparison: none

[Series 1: wbr_r-proj_st wbr rest · 6.40mm/px · 6 of 64 frames shown]
[frame 6/64]
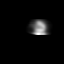
[frame 16/64]
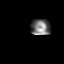
[frame 27/64]
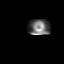
[frame 38/64]
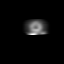
[frame 48/64]
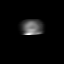
[frame 59/64]
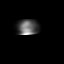

[Series 1: wbr rest · 6.40mm/px · 6 of 64 frames shown]
[frame 6/64]
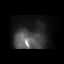
[frame 16/64]
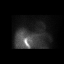
[frame 27/64]
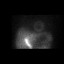
[frame 38/64]
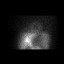
[frame 48/64]
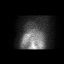
[frame 59/64]
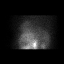

[Series 2: wbr stress-gsp · 6.40mm/px · 6 of 511 frames shown]
[frame 43/511  full-range]
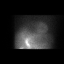
[frame 128/511  full-range]
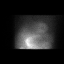
[frame 213/511  full-range]
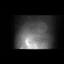
[frame 298/511  full-range]
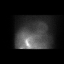
[frame 383/511  full-range]
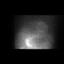
[frame 469/511  full-range]
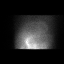

[Series 2: wbr_s-proj_st wbr stress-gsp · 6.40mm/px · 6 of 512 frames shown]
[frame 43/512]
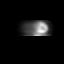
[frame 128/512]
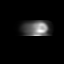
[frame 214/512]
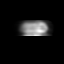
[frame 299/512]
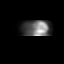
[frame 384/512]
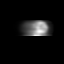
[frame 470/512]
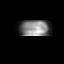

[Series 3: wbr_s-proj_st wbr stress-sum-em · 6.40mm/px · 6 of 64 frames shown]
[frame 6/64]
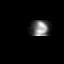
[frame 16/64]
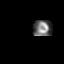
[frame 27/64]
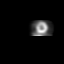
[frame 38/64]
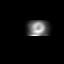
[frame 48/64]
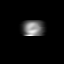
[frame 59/64]
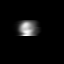

[Series 3: wbr stress-sum-em · 6.40mm/px · 6 of 64 frames shown]
[frame 6/64]
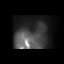
[frame 16/64]
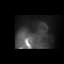
[frame 27/64]
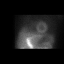
[frame 38/64]
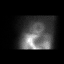
[frame 48/64]
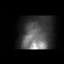
[frame 59/64]
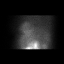

[36 of 36 positions shown; findings below may reference images not displayed]

Canned report from images found in remote index.

Refer to host system for actual result text.

## 2017-08-02 ENCOUNTER — Encounter: Payer: Medicare Other | Admitting: Internal Medicine

## 2017-08-03 ENCOUNTER — Ambulatory Visit (INDEPENDENT_AMBULATORY_CARE_PROVIDER_SITE_OTHER): Payer: Medicare Other | Admitting: Pharmacist

## 2017-08-03 ENCOUNTER — Encounter: Payer: Self-pay | Admitting: Pharmacist

## 2017-08-03 ENCOUNTER — Ambulatory Visit (INDEPENDENT_AMBULATORY_CARE_PROVIDER_SITE_OTHER): Payer: Medicare Other | Admitting: Dietician

## 2017-08-03 DIAGNOSIS — E1140 Type 2 diabetes mellitus with diabetic neuropathy, unspecified: Secondary | ICD-10-CM | POA: Diagnosis not present

## 2017-08-03 DIAGNOSIS — Z713 Dietary counseling and surveillance: Secondary | ICD-10-CM

## 2017-08-03 DIAGNOSIS — E1165 Type 2 diabetes mellitus with hyperglycemia: Secondary | ICD-10-CM

## 2017-08-03 DIAGNOSIS — Z7189 Other specified counseling: Secondary | ICD-10-CM

## 2017-08-03 DIAGNOSIS — Z794 Long term (current) use of insulin: Secondary | ICD-10-CM | POA: Diagnosis not present

## 2017-08-03 DIAGNOSIS — E119 Type 2 diabetes mellitus without complications: Secondary | ICD-10-CM

## 2017-08-03 DIAGNOSIS — IMO0002 Reserved for concepts with insufficient information to code with codable children: Secondary | ICD-10-CM

## 2017-08-03 NOTE — Progress Notes (Signed)
Freestyle Libre Pro CGM sensor placed and started. Patient was educated about wearing sensor, keeping food, activity and medication log and when to call office. Follow up was arranged with the patient.   

## 2017-08-03 NOTE — Patient Instructions (Addendum)
Please record the time, amount and what food drinks and activities you have while wearing the continuous glucose monitor(CGM) in the folder provided.  Bring the folder with you to follow up appointments  Do not have a CT or an MRI while wearing the CGM.   Please make an appointment for 1 week with me and a doctor for the first of two CGM downloads..   You will also return in 2 weeks to have your second download and the CGM removed.  

## 2017-08-04 ENCOUNTER — Other Ambulatory Visit: Payer: Self-pay | Admitting: Dietician

## 2017-08-04 DIAGNOSIS — IMO0002 Reserved for concepts with insufficient information to code with codable children: Secondary | ICD-10-CM

## 2017-08-04 DIAGNOSIS — E1140 Type 2 diabetes mellitus with diabetic neuropathy, unspecified: Secondary | ICD-10-CM

## 2017-08-04 DIAGNOSIS — E1165 Type 2 diabetes mellitus with hyperglycemia: Principal | ICD-10-CM

## 2017-08-04 DIAGNOSIS — Z794 Long term (current) use of insulin: Principal | ICD-10-CM

## 2017-08-04 NOTE — Telephone Encounter (Signed)
Requests testing supply prescriptions

## 2017-08-04 NOTE — Progress Notes (Signed)
Diabetes Self-Management Education  Visit Type:  Follow-up  Appt. Start Time: 1100 Appt. End Time: 1130  08/04/2017  [name], identified by name and date of birth, is a 61 y.o. female with a diagnosis of Diabetes:  .   ASSESSMENT  Gave patient a new Accu chek Guide meter. She needs strips and lancets sent to       Diabetes Self-Management Education - 08/04/17 1700      Health Coping   How would you rate your overall health? Fair     Psychosocial Assessment   Patient Belief/Attitude about Diabetes Motivated to manage diabetes   Self-care barriers Lack of transportation;Lack of material resources   Self-management support Doctor's office;Family;CDE visits   Patient Concerns Nutrition/Meal planning;Monitoring   Special Needs None   Preferred Learning Style No preference indicated   Learning Readiness Ready     Pre-Education Assessment   Patient understands the diabetes disease and treatment process. Demonstrates understanding / competency   Patient understands incorporating nutritional management into lifestyle. Needs Review   Patient undertands incorporating physical activity into lifestyle. Needs Review   Patient understands using medications safely. Demonstrates understanding / competency   Patient understands monitoring blood glucose, interpreting and using results Needs Instruction   Patient understands prevention, detection, and treatment of acute complications. Needs Review   Patient understands prevention, detection, and treatment of chronic complications. Demonstrates understanding / competency   Patient understands how to develop strategies to address psychosocial issues. Demonstrates understanding / competency   Patient understands how to develop strategies to promote health/change behavior. Demonstrates understanding / competency     Complications   Last HgB A1C per patient/outside source 10 %   How often do you check your blood sugar? 1-2 times/day   Have  you had a dilated eye exam in the past 12 months? Yes   Are you checking your feet? Yes     Patient Education   Previous Diabetes Education Yes (please comment)   Nutrition management  Other (comment)  assisted with ideas to stretch her food dollar   Monitoring Taught/evaluated SMBG meter.     Individualized Goals (developed by patient)   Monitoring  test my blood glucose as discussed     Outcomes   Program Status Re-entered      Learning Objective:  Patient will have a greater understanding of diabetes self-management. Patient education plan is to attend individual and/or group sessions per assessed needs and concerns.   Plan:   There are no Patient Instructions on file for this visit.   Expected Outcomes:  Demonstrated interest in learning. Expect positive outcomes  Education material provided: Support group flyer  If problems or questions, patient to contact team via:  Phone  Future DSME appointment: - 2 wks  [place], [place] 08/04/2017 6:05 PM.

## 2017-08-05 ENCOUNTER — Other Ambulatory Visit: Payer: Self-pay | Admitting: Internal Medicine

## 2017-08-05 DIAGNOSIS — Z794 Long term (current) use of insulin: Principal | ICD-10-CM

## 2017-08-05 DIAGNOSIS — E1140 Type 2 diabetes mellitus with diabetic neuropathy, unspecified: Secondary | ICD-10-CM

## 2017-08-05 DIAGNOSIS — E1165 Type 2 diabetes mellitus with hyperglycemia: Principal | ICD-10-CM

## 2017-08-05 DIAGNOSIS — IMO0002 Reserved for concepts with insufficient information to code with codable children: Secondary | ICD-10-CM

## 2017-08-05 MED ORDER — ACCU-CHEK FASTCLIX LANCETS MISC: 5 refills | Status: DC

## 2017-08-05 MED ORDER — ACCU-CHEK GUIDE W/DEVICE KIT: 1.0000 | PACK | Freq: Three times a day (TID) | 0 refills | Status: DC

## 2017-08-05 MED ORDER — GLUCOSE BLOOD VI STRP: ORAL_STRIP | 5 refills | Status: DC

## 2017-08-08 ENCOUNTER — Other Ambulatory Visit: Payer: Self-pay | Admitting: Cardiovascular Disease

## 2017-08-08 NOTE — Telephone Encounter (Signed)
Refill Request.  

## 2017-08-09 ENCOUNTER — Other Ambulatory Visit: Payer: Self-pay | Admitting: *Deleted

## 2017-08-09 ENCOUNTER — Telehealth: Payer: Self-pay | Admitting: Internal Medicine

## 2017-08-09 ENCOUNTER — Other Ambulatory Visit: Payer: Self-pay

## 2017-08-09 MED ORDER — LOSARTAN POTASSIUM-HCTZ 100-12.5 MG PO TABS: 1.0000 | ORAL_TABLET | Freq: Every day | ORAL | 0 refills | Status: DC

## 2017-08-09 NOTE — Patient Outreach (Signed)
[facility] [facility]) Care Management  08/09/2017  LAKEITA PANTHER May 11, 1956 [id number]     Medication Adherence call to Mrs. Juliene Kirsh the reason for this call is because Susan Holmes is showing past due under [employer].on her losartan/hctz 100/12.5 mg she said she need  more medication I offer to call  [place] she also wants  a 90 days supply because she qualify call doctor's office for them to send in a new prescription for a 90 days supply     [facility] Management Direct Dial [phone]  Fax [phone] [email or web address]

## 2017-08-09 NOTE — Telephone Encounter (Signed)
[facility] Tech requesting patient refill for a 90 day supply for Losartan with HCTZ.  Please call back if any questions.

## 2017-08-09 NOTE — Telephone Encounter (Signed)
Request sent 

## 2017-08-10 ENCOUNTER — Ambulatory Visit (INDEPENDENT_AMBULATORY_CARE_PROVIDER_SITE_OTHER): Payer: Medicare Other | Admitting: Dietician

## 2017-08-10 VITALS — Wt 191.0 lb

## 2017-08-10 DIAGNOSIS — E119 Type 2 diabetes mellitus without complications: Secondary | ICD-10-CM

## 2017-08-10 DIAGNOSIS — Z713 Dietary counseling and surveillance: Secondary | ICD-10-CM

## 2017-08-10 DIAGNOSIS — Z794 Long term (current) use of insulin: Secondary | ICD-10-CM

## 2017-08-10 DIAGNOSIS — Z6829 Body mass index (BMI) 29.0-29.9, adult: Secondary | ICD-10-CM | POA: Diagnosis not present

## 2017-08-10 DIAGNOSIS — E1165 Type 2 diabetes mellitus with hyperglycemia: Principal | ICD-10-CM

## 2017-08-10 DIAGNOSIS — E1140 Type 2 diabetes mellitus with diabetic neuropathy, unspecified: Secondary | ICD-10-CM

## 2017-08-10 DIAGNOSIS — IMO0002 Reserved for concepts with insufficient information to code with codable children: Secondary | ICD-10-CM

## 2017-08-10 NOTE — Patient Instructions (Addendum)
Your Blood sugar is doing much better now with dietary changes and taking Trulicity weekly and Janumet two times every day.  Keep taking 40 units lantus each night like you have been.  Take 10 units Humalog with breakfast only when you eat something (Coffee doesn't count)   Skip Humalog if you don't eat.  Inject 10 units with supper   I will talk to Dr. Marlowe Sax and call you if she wants you do do anything differently

## 2017-08-10 NOTE — Progress Notes (Signed)
Diabetes Self-Management Education  Visit Type:     Appt. Start Time: 1100 Appt. End Time: 1200  08/10/2017  Susan Holmes, identified by name and date of birth, is a 61 y.o. female with a diagnosis of Diabetes:  Marland Kitchen  Type 2  ASSESSMENT Amarri brought the CGM and her meter. She had completed the log for 6 days. She is taking Trulicity now regularly and Janumet. It shows that she eats little to no breakfast and still takes her Humalog, eats no lunch and still took humalog on some days, had reduced her lantus to 40 units herself and when she took 20 units Humalog for her dinner she bottomed out, but when she reduced it to 10 units she did not have a low blood sugar.   Her meter shows a dramatic drop from aug 4 to Aug 6th and has remained better controlled since then in the 100s and low 200s. Low blood sugar occurred when she took Humalog and didn't eat or didn't eat enough. She doesn't need as much insulin now that she is taking trulicity and eating less. Will discuss with Dr. Marlowe Sax.  She wore the CGM for 1 complete day and two half days before it fell off.  It showed low blood sugar on aug 9th briefly ~ 5AM and from 2 Pm until 8 PM when she took Humalog at lunch and did not eat.  She reported a low after taking 20 units Humalog for a sandwich and chips on Tuesday Aug 14th between 6-7 PM, but did not check blood sugar and did not have CGM on.  Her meter shows an averge of 283 for past 30 days and 160 for past week.   Weight 191 lb (86.6 kg). Body mass index is 29.91 kg/m.       Diabetes Self-Management Education - 08/10/17 1200      Health Coping   How would you rate your overall health? Good     Psychosocial Assessment   Patient Belief/Attitude about Diabetes Motivated to manage diabetes   Self-care barriers Lack of transportation;Lack of material resources   Self-management support Doctor's office;CDE visits   Patient Concerns Glycemic Control   Special Needs None   Preferred  Learning Style No preference indicated   Learning Readiness Change in progress     Complications   How often do you check your blood sugar? 3-4 times/day   Fasting Blood glucose range (mg/dL) 70-129;130-179   Postprandial Blood glucose range (mg/dL) 180-200;>200   Number of hypoglycemic episodes per month 2   Can you tell when your blood sugar is low? Yes   What do you do if your blood sugar is low? eat candy   Number of hyperglycemic episodes per week 0   Have you had a dilated eye exam in the past 12 months? Yes   Have you had a dental exam in the past 12 months? Yes   Are you checking your feet? Yes   How many days per week are you checking your feet? 7     Dietary Intake   Breakfast coffee with cream and non nutritiv sugar, sometimes a banana   Lunch skips/nothing   Dinner fried chicken or fried chicken wings or sandwich and chips   Beverage(s) water     Exercise   Exercise Type ADL's   How many days per week to you exercise? 5  30   How many minutes per day do you exercise? 30   Total minutes per week  of exercise 150     Patient Education   Nutrition management  Role of diet in the treatment of diabetes and the relationship between the three main macronutrients and blood glucose level   Physical activity and exercise  Role of exercise on diabetes management, blood pressure control and cardiac health.   Acute complications Taught treatment of hypoglycemia - the 15 rule.     Individualized Goals (developed by patient)   Monitoring  test my blood glucose as discussed     Patient Self-Evaluation of Goals - Patient rates self as meeting previously set goals (% of time)   Monitoring >75%     Outcomes   Program Status Completed      Learning Objective:  Patient will have a greater understanding of diabetes self-management. Patient education plan is to attend individual and/or group sessions per assessed needs and concerns. My plan to support myself in continuing these  changes to care for my diabetes is to attend or contact:   ? Add local gym and fitness center as an option  Diabetes Support Groups ?Type 1 diabetes support group Type 2 diabetes support group : 2nd Monday of every month from 6-7 PM at [address] [facility] conference room [phone]  Other ? Add other local support resources -doctor's office, CDE, Dietitian, pharmacist, church    Plan:   Patient Instructions  Your Blood sugar is doing much better now with dietary changes and taking Trulicity weekly and Janumet two times every day.  Keep taking 40 units lantus each night like you have been.  Take 10 units Humalog with breakfast only when you eat something (Coffee doesn't count)   Take 10 units of Humalog with supper  I will talk to Dr. Marlowe Sax and call you if she wants you do do anything differently    Expected Outcomes:  Demonstrated interest in learning. Expect positive outcomes  Education material provided: Support group flyer  If problems or questions, patient to contact team via:  Phone  Future DSME appointment: - 2 months  Adalena Abdulla, [name], [place] 08/10/2017 12:29 PM.

## 2017-08-15 ENCOUNTER — Other Ambulatory Visit: Payer: Self-pay

## 2017-08-15 NOTE — Patient Outreach (Signed)
[facility]) Care Management  08/15/2017  CHLOEANNE POTEET [birth year] [id number]  TELEPHONE SCREENING Referral date: 08/09/17 Referral source:  Pharmacy medication adherence/ patient engagement Referral reason: patient engagement tool score: 10 Insurance: [place] health care/ Medicaid Attempt #1  Telephone call to patient regarding pharmacy referral. Listed home contact number states call cannot be completed as dialed.  Atttempted listed mobile number. Message states number has been disconnected.  Attempted emergency contact phone number. Contact answering phone identified herself as patients daughter. Confirmed patients listed home number. HIPAA compliant message left with call back phone number.   PLAN:  RNCM will attempt 2nd telephone call to patient within 2 weeks.  [name] RN,BSN,CCM [facility] Telephonic  [phone]

## 2017-08-22 ENCOUNTER — Other Ambulatory Visit: Payer: Self-pay

## 2017-08-22 NOTE — Patient Outreach (Signed)
[place] [facility]) Care Management  08/22/2017  Susan Holmes September 19, 1956 [id number]   TELEPHONE SCREENING Referral date: 08/09/17 Referral source:  Pharmacy medication adherence/ patient engagement Referral reason: patient engagement tool score: 10 Insurance: [place] health care/ Medicaid Attempt #1  Telephone call to patient regarding pharmacy referral. Unable to reach patient. HIPAA compliant message left with call back phone number.   PLAN:  RNCM will attempt 3rd telephone call to patient within 2 weeks.  [name] RN,BSN,CCM [facility] Telephonic  [phone]

## 2017-08-23 ENCOUNTER — Encounter: Payer: Self-pay | Admitting: *Deleted

## 2017-08-23 ENCOUNTER — Other Ambulatory Visit: Payer: Self-pay | Admitting: *Deleted

## 2017-08-23 NOTE — Patient Outreach (Signed)
[facility]) Care Management  08/23/2017  CAILYNN BODNAR 1956/07/02 [id number]  Telephone call attempt to 2 contact numbers-recording states number has been disconnected or is no longer in service.  Plan: Send outreach letter Close out in 10 business days if no contact.  Sherrin Daisy, RN BSN [place] Management Coordinator [facility] Care Management  [phone]

## 2017-08-26 ENCOUNTER — Ambulatory Visit: Payer: Self-pay

## 2017-08-30 ENCOUNTER — Ambulatory Visit: Payer: Medicare Other | Admitting: Pulmonary Disease

## 2017-09-02 ENCOUNTER — Other Ambulatory Visit: Payer: Self-pay | Admitting: Internal Medicine

## 2017-09-02 DIAGNOSIS — R1013 Epigastric pain: Secondary | ICD-10-CM

## 2017-09-06 ENCOUNTER — Encounter: Payer: Self-pay | Admitting: Internal Medicine

## 2017-09-06 ENCOUNTER — Ambulatory Visit (INDEPENDENT_AMBULATORY_CARE_PROVIDER_SITE_OTHER): Payer: Medicare Other | Admitting: Internal Medicine

## 2017-09-06 ENCOUNTER — Telehealth: Payer: Self-pay | Admitting: *Deleted

## 2017-09-06 VITALS — BP 117/75 | HR 79 | Temp 98.1°F | Ht 67.0 in | Wt 184.2 lb

## 2017-09-06 DIAGNOSIS — IMO0002 Reserved for concepts with insufficient information to code with codable children: Secondary | ICD-10-CM

## 2017-09-06 DIAGNOSIS — I10 Essential (primary) hypertension: Secondary | ICD-10-CM

## 2017-09-06 DIAGNOSIS — F32A Depression, unspecified: Secondary | ICD-10-CM

## 2017-09-06 DIAGNOSIS — E1149 Type 2 diabetes mellitus with other diabetic neurological complication: Secondary | ICD-10-CM

## 2017-09-06 DIAGNOSIS — E1169 Type 2 diabetes mellitus with other specified complication: Secondary | ICD-10-CM

## 2017-09-06 DIAGNOSIS — E11649 Type 2 diabetes mellitus with hypoglycemia without coma: Secondary | ICD-10-CM | POA: Diagnosis not present

## 2017-09-06 DIAGNOSIS — Z79899 Other long term (current) drug therapy: Secondary | ICD-10-CM

## 2017-09-06 DIAGNOSIS — Z8639 Personal history of other endocrine, nutritional and metabolic disease: Secondary | ICD-10-CM | POA: Diagnosis not present

## 2017-09-06 DIAGNOSIS — E785 Hyperlipidemia, unspecified: Secondary | ICD-10-CM

## 2017-09-06 DIAGNOSIS — F17210 Nicotine dependence, cigarettes, uncomplicated: Secondary | ICD-10-CM

## 2017-09-06 DIAGNOSIS — Z23 Encounter for immunization: Secondary | ICD-10-CM

## 2017-09-06 DIAGNOSIS — F5105 Insomnia due to other mental disorder: Secondary | ICD-10-CM

## 2017-09-06 DIAGNOSIS — E559 Vitamin D deficiency, unspecified: Secondary | ICD-10-CM | POA: Diagnosis not present

## 2017-09-06 DIAGNOSIS — Z794 Long term (current) use of insulin: Secondary | ICD-10-CM | POA: Diagnosis not present

## 2017-09-06 DIAGNOSIS — E1165 Type 2 diabetes mellitus with hyperglycemia: Principal | ICD-10-CM

## 2017-09-06 DIAGNOSIS — F329 Major depressive disorder, single episode, unspecified: Secondary | ICD-10-CM

## 2017-09-06 DIAGNOSIS — E1140 Type 2 diabetes mellitus with diabetic neuropathy, unspecified: Secondary | ICD-10-CM

## 2017-09-06 DIAGNOSIS — Z0000 Encounter for general adult medical examination without abnormal findings: Secondary | ICD-10-CM

## 2017-09-06 HISTORY — DX: Personal history of other endocrine, nutritional and metabolic disease: Z86.39

## 2017-09-06 LAB — POCT GLYCOSYLATED HEMOGLOBIN (HGB A1C): Hemoglobin A1C: 9.6

## 2017-09-06 LAB — GLUCOSE, CAPILLARY: Glucose-Capillary: 118 mg/dL — ABNORMAL HIGH (ref 65–99)

## 2017-09-06 MED ORDER — MELATONIN 10 MG PO CAPS: ORAL_CAPSULE | ORAL | 1 refills | Status: DC

## 2017-09-06 MED ORDER — DULOXETINE HCL 30 MG PO CPEP: 30.0000 mg | ORAL_CAPSULE | Freq: Every day | ORAL | 0 refills | Status: DC

## 2017-09-06 NOTE — Assessment & Plan Note (Signed)
Uncontrolled type 2 diabetes. A1c 9.6 at this visit. Current medication regimen includes Lantus 60 units at bedtime, Janumet 50-1000 mg daily, Humalog 10 units with breakfast + 20 units with dinner, and Trulicity 6.28 mg weekly. Patient has been taking her medications differently - she is using 40 units of Lantus at bedtime, Humalog 10 units in the late afternoon and 10 units in the evening. She did not bring her meter to this visit. States her CBGs are in the 100s at home. Does report having 2 episodes of hypoglycemia with CBGs in the 60s which happened when she did not eat. Patient does not eat breakfast. She eats meals in the late afternoon and evening. During her previous visit, she was referred to endocrinology but has not been able to make it to her appointment as she could not find their office.  Plan -Advised her to continue her current management. Will not increase the dose of her insulin in the setting of recent hypoglycemic symptoms. -Endocrinology appointment has been rescheduled for September 26

## 2017-09-06 NOTE — Telephone Encounter (Signed)
Call to Dr. Cindra Eves office patient had an appointment on 07/29/2017 at 10:40 AM.  No show.  Appointment rescheduled for 09/21/2017 at 10:40 AM.  Patient was given new appointment and card and told that the office will sent a letter which will include a map to the office.  Sander Nephew, RN 08/06/2017 2:20 PM.

## 2017-09-06 NOTE — Assessment & Plan Note (Signed)
Influenza vaccine administered

## 2017-09-06 NOTE — Assessment & Plan Note (Addendum)
Vitamin D level was low per endocrinology documentation from 2015. No further values in the system since then.  Plan -Check vitamin D level  Addendum: Vit. D level 23.6.  -Vit D 1000 IU daily  -Recheck vit D level in 6-8 weeks -Tried calling the patient but could not reach her over the phone. Will try calling again.   Addendum: Finally able to get in touch with the patient. Results and treatment plan have been discussed over the phone.

## 2017-09-06 NOTE — Progress Notes (Signed)
   CC: Patient is here for a regular checkup. Depression, diabetes, hypertension, and vitamin D deficiency were discussed.  HPI:  [name] is a 61 y.o. female with a past medical history of conditions listed below presenting to the clinic for a regular checkup. Depression, diabetes, hypertension, and vitamin D deficiency were discussed.  Past Medical History:  Diagnosis Date  . Abnormal stress test   . Anxiety   . Arthritis   . Congenital blindness    L eye  . Depression   . Diabetes mellitus, type II ([place])   . Glaucoma   . Hepatitis B infection pt unsure   resolved  . HTN (hypertension)   . Hyperlipidemia   . Stroke ([place])   . Tobacco abuse    .5 PPD smoker   Review of Systems: Pertinent positives mentioned in HPI. Remainder of all ROS negative.   Physical Exam:  Vitals:   09/06/17 1336  BP: 117/75  Pulse: 79  Temp: 98.1 F (36.7 C)  TempSrc: Oral  SpO2: 99%  Weight: 184 lb 3.2 oz (83.6 kg)  Height: 5\' 7"  (1.702 m)   Physical Exam  Constitutional: She is oriented to person, place, and time. She appears well-developed and well-nourished. No distress.  HENT:  Head: Normocephalic and atraumatic.  Mouth/Throat: Oropharynx is clear and moist.  Eyes: Right eye exhibits no discharge. Left eye exhibits no discharge.  Cardiovascular: Normal rate, regular rhythm and intact distal pulses.   Pulmonary/Chest: Effort normal and breath sounds normal. No respiratory distress. She has no wheezes. She has no rales.  Abdominal: Soft. Bowel sounds are normal. She exhibits no distension. There is no tenderness.  Musculoskeletal: She exhibits no edema.  Neurological: She is alert and oriented to person, place, and time.  Skin: Skin is warm and dry.    Assessment & Plan:   See Encounters Tab for problem based charting.  Patient discussed with Dr. Dareen Piano

## 2017-09-06 NOTE — Assessment & Plan Note (Addendum)
Blood pressure currently well controlled. 117/75 at this visit. She is taking losartan-hydrochlorothiazide 100-12.5 mg daily and carvedilol 12.5 mg twice daily.  Plan -Continue current management -BMP to assessment renal function and electrolyte status  Addendum: Cr stable at 1.0, GFR 68. Calcium mildly high at 10.5 on BMP. Per chart review, calcium level was high in 2015 in the setting of HCTZ use. HCTZ was started during her previous visit in July for uncontrolled HTN. -D/c HCTZ -Start Amlodipine 5 mg daily. Urine microalbumin/ Cr ratio has been normal on prior labs.  -Continue Losartan and Carvedilol  -Recheck calcium level at follow-up visit in 6 weeks -Tried calling the patient but could not reach her over the phone. Will try calling again.  -Spoke to her pharmacy -Losartan-HCTZ combination pill has been cancelled. Instead, prescribed Losartan separately.   Addendum: Finally able to get in touch with the patient. Results and treatment plan have been discussed over the phone.

## 2017-09-06 NOTE — Patient Instructions (Signed)
Ms. Gelles it was nice seeing you today.  -Take Cymbalta 30 mg daily  -Take melatonin 10 mg daily at dusk to help you sleep  -Does not drink any sodas or other caffeinated beverages in the evening

## 2017-09-06 NOTE — Assessment & Plan Note (Signed)
She is currently taking trazodone 200 mg at bedtime. Cymbalta was prescribed during her previous visit in July. Patient has not taken his medication for the past one month. States trazodone is not really helping with her insomnia. States she feels agitated and "snappy" all the time. States she lies in bed for hours thinking about things including deceased relatives and cannot fall asleep. She drinks sodas as in the evening. Denies having any hallucinations. Denies having any suicidal or homicidal ideation.  Plan -I had an extensive discussion with her about sleep hygiene. -Avoid caffeinated beverages after lunchtime -Melatonin 10 mg daily at dusk -Continue trazodone at this time as discontinuing or tapering down the medication will make her insomnia and agitation worse. -Advised her to start taking Cymbalta 30 mg daily again. Will titrate dose based on response at future visit. Goal would be to taper off trazodone in the future. -Follow-up in 6 weeks

## 2017-09-07 ENCOUNTER — Other Ambulatory Visit: Payer: Self-pay | Admitting: Internal Medicine

## 2017-09-07 LAB — BMP8+ANION GAP
Anion Gap: 20 mmol/L — ABNORMAL HIGH (ref 10.0–18.0)
BUN/Creatinine Ratio: 7 — ABNORMAL LOW (ref 12–28)
BUN: 7 mg/dL — ABNORMAL LOW (ref 8–27)
CO2: 21 mmol/L (ref 20–29)
Calcium: 10.5 mg/dL — ABNORMAL HIGH (ref 8.7–10.3)
Chloride: 101 mmol/L (ref 96–106)
Creatinine, Ser: 1.02 mg/dL — ABNORMAL HIGH (ref 0.57–1.00)
GFR calc Af Amer: 68 mL/min/{1.73_m2} (ref >59)
GFR calc non Af Amer: 59 mL/min/{1.73_m2} — ABNORMAL LOW (ref >59)
Glucose: 98 mg/dL (ref 65–99)
Potassium: 4 mmol/L (ref 3.5–5.2)
Sodium: 142 mmol/L (ref 134–144)

## 2017-09-07 LAB — VITAMIN D 25 HYDROXY (VIT D DEFICIENCY, FRACTURES): Vit D, 25-Hydroxy: 23.6 ng/mL — ABNORMAL LOW (ref 30.0–100.0)

## 2017-09-07 MED ORDER — CHOLECALCIFEROL 25 MCG (1000 UT) PO CAPS: 1000.0000 [IU] | ORAL_CAPSULE | Freq: Every day | ORAL | 0 refills | Status: DC

## 2017-09-07 MED ORDER — AMLODIPINE BESYLATE 5 MG PO TABS: 5.0000 mg | ORAL_TABLET | Freq: Every day | ORAL | 1 refills | Status: DC

## 2017-09-07 MED ORDER — LOSARTAN POTASSIUM 100 MG PO TABS: 100.0000 mg | ORAL_TABLET | Freq: Every day | ORAL | 3 refills | Status: DC

## 2017-09-07 NOTE — Addendum Note (Signed)
Addended by: Shela Leff on: 09/07/2017 07:23 PM   Modules accepted: Orders

## 2017-09-08 NOTE — Progress Notes (Signed)
Internal Medicine Clinic Attending  Case discussed with Dr. Rathoreat the time of the visit. We reviewed the resident's history and exam and pertinent patient test results. I agree with the assessment, diagnosis, and plan of care documented in the resident's note.  

## 2017-09-16 ENCOUNTER — Telehealth: Payer: Self-pay

## 2017-09-16 NOTE — Telephone Encounter (Signed)
Has all her meds, she thought she didnt

## 2017-09-16 NOTE — Telephone Encounter (Signed)
Questions about meds. Please call pt back.

## 2017-09-19 ENCOUNTER — Emergency Department (HOSPITAL_COMMUNITY)
Admission: EM | Admit: 2017-09-19 | Discharge: 2017-09-19 | Disposition: A | Discharge status: Home / Self Care | Payer: Medicare Other | Attending: Emergency Medicine | Admitting: Emergency Medicine

## 2017-09-19 ENCOUNTER — Encounter (HOSPITAL_COMMUNITY): Payer: Self-pay

## 2017-09-19 DIAGNOSIS — F17210 Nicotine dependence, cigarettes, uncomplicated: Secondary | ICD-10-CM | POA: Diagnosis not present

## 2017-09-19 DIAGNOSIS — G4700 Insomnia, unspecified: Secondary | ICD-10-CM | POA: Insufficient documentation

## 2017-09-19 DIAGNOSIS — Z794 Long term (current) use of insulin: Secondary | ICD-10-CM | POA: Diagnosis not present

## 2017-09-19 DIAGNOSIS — E119 Type 2 diabetes mellitus without complications: Secondary | ICD-10-CM | POA: Diagnosis not present

## 2017-09-19 DIAGNOSIS — Z79899 Other long term (current) drug therapy: Secondary | ICD-10-CM | POA: Diagnosis not present

## 2017-09-19 DIAGNOSIS — F419 Anxiety disorder, unspecified: Secondary | ICD-10-CM | POA: Insufficient documentation

## 2017-09-19 DIAGNOSIS — R630 Anorexia: Secondary | ICD-10-CM | POA: Insufficient documentation

## 2017-09-19 DIAGNOSIS — Z7982 Long term (current) use of aspirin: Secondary | ICD-10-CM | POA: Diagnosis not present

## 2017-09-19 DIAGNOSIS — I10 Essential (primary) hypertension: Secondary | ICD-10-CM | POA: Insufficient documentation

## 2017-09-19 NOTE — ED Triage Notes (Signed)
Patient states she has not slept in 6 days. Patient states she has anxiety from not sleeping.

## 2017-09-19 NOTE — Discharge Instructions (Signed)
Have scheduled an appointment with her internal medicine doctor at 10:15 in the morning. Please continue taking her melatonin. May increase your trazodone 150 mg this evening.

## 2017-09-19 NOTE — ED Provider Notes (Signed)
[facility] DEPT Provider Note   CSN: [id number] Arrival date & time: 09/19/17  0848     History   Chief Complaint Chief Complaint  Patient presents with  . Insomnia    HPI Susan Holmes is a 61 y.o. female.  HPI 61 year old African-American female past medical history significant for insomnia, hypertension, diabetes, depression that presents to the emergency department today with complaints of insomnia. Patient states that she has not been sleeping for the past 6 days. Patient with history of insomnia and takes trazodone and doxepin. States that she is also try taking melatonin with little relief. Patient states that she is becoming anxious, not sleeping. She denies any SI or HI behavior. Does not appear to be responding to internal stimuli. States that she has had a decreased appetite. Patient states that she gets her medications filled tomorrow but she has been unable to make an appointment with them. Patient denies any other associated symptoms.  Patient denies any fever or chills, muscle aches, rashes, headache, dizziness, visual changes, lightheadedness, numbness, syncope, weakness.  Past Medical History:  Diagnosis Date  . Abnormal stress test   . Anxiety   . Arthritis   . Congenital blindness    L eye  . Depression   . Diabetes mellitus, type II ([place])   . Glaucoma   . Hepatitis B infection pt unsure   resolved  . HTN (hypertension)   . Hyperlipidemia   . Stroke ([place])   . Tobacco abuse    .5 PPD smoker    Patient Active Problem List   Diagnosis Date Noted  . History of vitamin D deficiency 09/06/2017  . Forgetfulness 06/07/2017  . Dyspepsia 04/26/2017  . History of CVA (cerebrovascular accident) 07/23/2016  . Cough 07/08/2016  . Lower back pain 08/06/2015  . At risk for polypharmacy 08/06/2015  . Right hip pain 07/04/2015  . Left hip pain 06/04/2015  . Osteoarthritis 12/11/2014  . Preventative health care 10/07/2014  . Liver hemangioma 04/04/2014  .  RBC microcytosis 02/05/2014  . Hyperparathyroidism ([place]) 03/08/2013  . Calcific tendinitis of right shoulder 02/26/2012  . Fatigue 12/14/2011  . Type II diabetes mellitus with neurological manifestations, uncontrolled ([place]) 06/12/2007  . Hyperlipidemia associated with type 2 diabetes mellitus ([place]) 06/12/2007  . ANXIETY 06/12/2007  . TOBACCO ABUSE 06/12/2007  . Depression 06/12/2007  . CONGENITAL BLINDNESS, LEFT EYE 06/12/2007  . Essential hypertension 06/12/2007    Past Surgical History:  Procedure Laterality Date  . ABDOMINAL HYSTERECTOMY    . LEFT HEART CATHETERIZATION WITH CORONARY ANGIOGRAM N/A 10/09/2014   Procedure: LEFT HEART CATHETERIZATION WITH CORONARY ANGIOGRAM;  Surgeon: Troy Sine, MD;  Location: [facility] CATH LAB;  Service: Cardiovascular;  Laterality: N/A;  . TEE WITHOUT CARDIOVERSION N/A 08/17/2016   Procedure: TRANSESOPHAGEAL ECHOCARDIOGRAM (TEE);  Surgeon: Sueanne Margarita, MD;  Location: [facility] ENDOSCOPY;  Service: Cardiovascular;  Laterality: N/A;    OB History    No data available       Home Medications    Prior to Admission medications   Medication Sig Start Date End Date Taking? Authorizing Provider  amLODipine (NORVASC) 5 MG tablet TAKE 1 TABLET(5 MG) BY MOUTH DAILY 09/13/17  Yes Shela Leff, MD  aspirin 81 MG EC tablet TAKE 81 MG BY MOUTH EVERY DAY 08/17/16  Yes Turner, Traci R, MD  BESIVANCE 0.6 % SUSP Place 1 drop into the right eye 2 (two) times daily. 05/24/17  Yes [provider]  budesonide-formoterol (SYMBICORT) 160-4.5 MCG/ACT inhaler Inhale 2 puffs  into the lungs 2 (two) times daily. Patient taking differently: Inhale 2 puffs into the lungs 2 (two) times daily as needed (shortness of breath).  05/24/17  Yes Mannam, Praveen, MD  carvedilol (COREG) 12.5 MG tablet TAKE 1 TABLET(12.5 MG) BY MOUTH TWICE DAILY 02/02/17  Yes Skeet Latch, MD  diclofenac sodium (VOLTAREN) 1 % GEL Apply 2 g topically daily as needed (pain).    Yes [provider]  docusate sodium (COLACE) 100 MG capsule Take 1 capsule (100 mg total) by mouth 2 (two) times daily. Patient taking differently: Take 100 mg by mouth daily as needed for moderate constipation.  01/04/17  Yes Rice, Resa Miner, MD  Dulaglutide (TRULICITY) 8.58 IF/0.2DX SOPN INJECT 0.75 MG UNDER THE SKIN ONCE A WEEK( DISCONTINUE BYETTA) 07/28/17  Yes Shela Leff, MD  DULoxetine (CYMBALTA) 30 MG capsule Take 1 capsule (30 mg total) by mouth daily. 09/06/17 09/06/18 Yes Shela Leff, MD  DUREZOL 0.05 % EMUL Place 1 drop into the right eye 2 (two) times daily. 05/24/17  Yes [provider]  erythromycin ophthalmic ointment Place 1 application into the right eye daily as needed for irritation. 06/10/17  Yes [provider]  famotidine (PEPCID) 20 MG tablet Take 1 tablet (20 mg total) by mouth daily as needed for heartburn or indigestion. 04/26/17 04/26/18 Yes Shela Leff, MD  fluticasone (FLONASE) 50 MCG/ACT nasal spray Place 2 sprays into both nostrils daily. Patient taking differently: Place 2 sprays into both nostrils daily as needed for allergies.  07/28/17  Yes Shela Leff, MD  Insulin Glargine (LANTUS SOLOSTAR) 100 UNIT/ML Solostar Pen Inject 60 Units into the skin at bedtime. Patient taking differently: Inject 40 Units into the skin at bedtime.  07/28/17  Yes Shela Leff, MD  insulin lispro (HUMALOG KWIKPEN) 100 UNIT/ML KiwkPen Inject 10 units with lunch and 20 units with supper. 04/26/17  Yes Shela Leff, MD  loratadine (CLARITIN) 10 MG tablet Take 1 tablet (10 mg total) by mouth daily. 07/28/17 07/28/18 Yes Shela Leff, MD  losartan-hydrochlorothiazide (HYZAAR) 100-12.5 MG tablet Take 1 tablet by mouth daily.   Yes [provider]  Melatonin 10 MG CAPS Take 1 capsule by mouth in the evening for insomnia Patient taking differently: Take 10 mg by mouth at bedtime as needed (sleep).  09/06/17  Yes Shela Leff, MD    pantoprazole (PROTONIX) 40 MG tablet TAKE 1 TABLET BY MOUTH DAILY 09/06/17  Yes Shela Leff, MD  pravastatin (PRAVACHOL) 10 MG tablet TAKE 1/2 TABLET BY MOUTH DAILY 08/08/17  Yes Skeet Latch, MD  sitaGLIPtin-metformin (JANUMET) 50-1000 MG tablet Take 1 tablet by mouth 2 (two) times daily with a meal. 07/28/17  Yes Shela Leff, MD  timolol (TIMOPTIC) 0.5 % ophthalmic solution Place 1 drop into both eyes as directed. 01/04/17  Yes [provider]  traZODone (DESYREL) 100 MG tablet Take 2 tablets by mouth at bedtime.  01/29/17  Yes [provider]  ACCU-CHEK FASTCLIX LANCETS MISC CHECK BLOOD SUGAR THREE TIMES DAILY AS DIRECTED 08/05/17   Shela Leff, MD  Blood Glucose Monitoring Suppl (ACCU-CHEK GUIDE) w/Device KIT 1 each by Does not apply route 3 (three) times daily. 08/05/17   Shela Leff, MD  Cholecalciferol 1000 units capsule Take 1 capsule (1,000 Units total) by mouth daily. Patient not taking: Reported on 09/19/2017 09/07/17   Shela Leff, MD  dextromethorphan-guaiFENesin [facility]) 30-600 MG 12hr tablet Take 1 tablet by mouth 2 (two) times daily. Patient not taking: Reported on 09/19/2017 07/28/17   Marlowe Sax,  Wandra Feinstein, MD  glucose blood (ACCU-CHEK GUIDE) test strip Use to test blood sugar 3 times daily. 08/05/17   Shela Leff, MD  Insulin Pen Needle (VALUMARK PEN NEEDLES) 31G X 8 MM MISC 1 each by Does not apply route daily. 11/20/14   Francesca Oman, DO  losartan (COZAAR) 100 MG tablet Take 1 tablet (100 mg total) by mouth daily. Patient not taking: Reported on 09/19/2017 09/07/17 09/07/18  Shela Leff, MD    Family History Family History  Problem Relation Age of Onset  . Heart disease Mother 73  . Hypertension Mother   . CVA Mother   . Heart disease Father 26  . CVA Sister   . CVA Brother   . Aneurysm Brother   . Aneurysm Sister   . CVA Maternal Grandmother   . CVA Maternal Grandfather   . Colon cancer Neg Hx   .  Esophageal cancer Neg Hx   . Stomach cancer Neg Hx     Social History Social History  Substance Use Topics  . Smoking status: Current Every Day Smoker    Packs/day: 0.25    Years: 20.00    Types: Cigarettes  . Smokeless tobacco: Current User    Types: Chew  . Alcohol use No     Allergies   Ace inhibitors; Chantix [varenicline]; and Lipitor [atorvastatin]   Review of Systems Review of Systems  Constitutional: Positive for appetite change. Negative for chills and fever.  Eyes: Negative for visual disturbance.  Musculoskeletal: Negative for arthralgias and myalgias.  Skin: Negative for rash.  Neurological: Negative for dizziness, syncope, weakness, light-headedness, numbness and headaches.  Psychiatric/Behavioral: Positive for sleep disturbance. Negative for hallucinations and suicidal ideas. The patient is nervous/anxious.      Physical Exam Updated Vital Signs BP (!) 133/94 (BP Location: Right Arm)   Pulse 76   Temp 98.1 F (36.7 C) (Oral)   Resp 18   Ht 5' 7" (1.702 m)   Wt 81.6 kg (180 lb)   SpO2 99%   BMI 28.19 kg/m   Physical Exam  Constitutional: She is oriented to person, place, and time. She appears well-developed and well-nourished. No distress.  HENT:  Head: Normocephalic and atraumatic.  Eyes: Pupils are equal, round, and reactive to light. Conjunctivae and EOM are normal. Right eye exhibits no discharge. Left eye exhibits no discharge. No scleral icterus.  Neck: Normal range of motion. Neck supple.  Cardiovascular: Normal rate, regular rhythm, normal heart sounds and intact distal pulses.   Pulmonary/Chest: Effort normal and breath sounds normal. No respiratory distress.  Musculoskeletal: Normal range of motion.  Neurological: She is alert and oriented to person, place, and time.  The patient is alert, attentive, and oriented x 3. Speech is clear. Cranial nerve II-VII grossly intact. Negative pronator drift. Sensation intact. Strength 5/5 in all  extremities. Reflexes 2+ and symmetric at biceps, triceps, knees, and ankles. Rapid alternating movement and fine finger movements intact. Posture and gait normal.   Skin: Skin is warm and dry. Capillary refill takes less than 2 seconds. No pallor.  Psychiatric: Her behavior is normal. Judgment and thought content normal.  Nursing note and vitals reviewed.    ED Treatments / Results  Labs (all labs ordered are listed, but only abnormal results are displayed) Labs Reviewed - No data to display  EKG  EKG Interpretation None       Radiology No results found.  Procedures Procedures (including critical care time)  Medications Ordered in ED Medications - No  data to display   Initial Impression / Assessment and Plan / ED Course  I have reviewed the triage vital signs and the nursing notes.  Pertinent labs & imaging results that were available during my care of the patient were reviewed by me and considered in my medical decision making (see chart for details).     Patient presents to the ED with complaints of insomnia for the past 6 days. Patient states that she has not slept for 6 days. Reports history of same. She has been taking trazodone, melatonin, doxepin with little relief. Patient unable to follow up with her primary care doctor. Patient does not appear to be in any acute distress. She does report some decreased appetite but denies any fever, chills, night sweats. Patient denies drug use. She denies any SI, HI behavior. Patient does not appear to be responding to internal stimuli. She does appear depressed and anxious. Spoke with my attending Dr. Leonette Monarch who didn't feel that adding any additional sleep medication is appropriate for this patient. The patient is on 100 mg of trazodone. Encouraged her that she could increase to 150 mg. Encouraged to continue taking melatonin. I did speak with her internal medicine office who has made her an appointment for 10:15 in the morning to  see her primary care doctor. The patient was updated on plan of care. She states that her ride is here and she needs to leave to go pick up somebody. Patient does not appear to be in any acute distress. Vital signs remained reassuring. I observed patient inability with normal gait.  I did speak with the pharmacy about adding a possible sleep medicine such as Ambien or Lunesta however do not feel comfortable with adding these medicines in a 61 year old who started taking trazodone and doxepin. Would defer to her primary care. Patient may need a sleep study at some point by primary care.  Pt is hemodynamically stable, in NAD, & able to ambulate in the ED. Evaluation does not show pathology that would require ongoing emergent intervention or inpatient treatment. I explained the diagnosis to the patient. Pain has been managed & has no complaints prior to dc. Pt is comfortable with above plan and is stable for discharge at this time. All questions were answered prior to disposition. Strict return precautions for f/u to the ED were discussed. Encouraged follow up with PCP.   Final Clinical Impressions(s) / ED Diagnoses   Final diagnoses:  Insomnia, unspecified type    New Prescriptions Discharge Medication List as of 09/19/2017  2:44 PM       Doristine Devoid, PA-C 09/19/17 1514    Cardama, Grayce Sessions, MD 09/19/17 1755

## 2017-09-20 ENCOUNTER — Ambulatory Visit: Payer: Medicare Other

## 2017-09-21 DIAGNOSIS — E1165 Type 2 diabetes mellitus with hyperglycemia: Secondary | ICD-10-CM | POA: Diagnosis not present

## 2017-09-21 DIAGNOSIS — Z794 Long term (current) use of insulin: Secondary | ICD-10-CM | POA: Diagnosis not present

## 2017-09-22 ENCOUNTER — Encounter: Payer: Self-pay | Admitting: *Deleted

## 2017-09-22 ENCOUNTER — Other Ambulatory Visit: Payer: Self-pay | Admitting: *Deleted

## 2017-09-22 NOTE — Patient Outreach (Signed)
[facility] Care Management  09/22/2017  MILIYAH LUPER 1956-07-14 [id number]  Unsuccessful call attempts x 3. No response to outreach letter.  Plan: Close case. Send MD closure letter.  Sherrin Daisy, RN BSN [place] Management Coordinator [facility] Care Management  [phone]

## 2017-09-27 ENCOUNTER — Ambulatory Visit: Payer: Medicare Other | Admitting: Pulmonary Disease

## 2017-09-28 ENCOUNTER — Ambulatory Visit: Payer: Medicare Other | Admitting: Dietician

## 2017-10-03 DIAGNOSIS — Z961 Presence of intraocular lens: Secondary | ICD-10-CM | POA: Diagnosis not present

## 2017-10-03 DIAGNOSIS — H401113 Primary open-angle glaucoma, right eye, severe stage: Secondary | ICD-10-CM | POA: Diagnosis not present

## 2017-10-05 ENCOUNTER — Other Ambulatory Visit: Payer: Self-pay | Admitting: Internal Medicine

## 2017-10-07 ENCOUNTER — Telehealth: Payer: Self-pay | Admitting: *Deleted

## 2017-10-07 NOTE — Telephone Encounter (Signed)
Attempted to contact patient on behalf of the Geriatric Task Force-no answer, HIPPA compliant message left on pt's recorder.Despina Hidden Cassady10/12/201811:44 AM

## 2017-10-27 ENCOUNTER — Encounter: Payer: Self-pay | Admitting: *Deleted

## 2017-10-30 ENCOUNTER — Other Ambulatory Visit: Payer: Self-pay | Admitting: Internal Medicine

## 2017-11-04 ENCOUNTER — Other Ambulatory Visit: Payer: Self-pay | Admitting: Internal Medicine

## 2017-11-04 ENCOUNTER — Other Ambulatory Visit: Payer: Self-pay | Admitting: Cardiovascular Disease

## 2017-11-07 NOTE — Telephone Encounter (Signed)
Please review for refill, Thanks !  

## 2017-11-07 NOTE — Telephone Encounter (Signed)
REFILL 

## 2017-11-08 ENCOUNTER — Encounter: Payer: Medicare Other | Admitting: Internal Medicine

## 2017-11-09 ENCOUNTER — Other Ambulatory Visit: Payer: Self-pay | Admitting: Internal Medicine

## 2017-11-15 ENCOUNTER — Ambulatory Visit: Payer: Medicare Other

## 2017-11-23 ENCOUNTER — Ambulatory Visit (INDEPENDENT_AMBULATORY_CARE_PROVIDER_SITE_OTHER): Payer: Medicare Other | Admitting: Internal Medicine

## 2017-11-23 VITALS — BP 130/85 | HR 84 | Temp 98.0°F | Wt 170.2 lb

## 2017-11-23 DIAGNOSIS — N179 Acute kidney failure, unspecified: Secondary | ICD-10-CM

## 2017-11-23 DIAGNOSIS — R61 Generalized hyperhidrosis: Secondary | ICD-10-CM

## 2017-11-23 DIAGNOSIS — E1165 Type 2 diabetes mellitus with hyperglycemia: Secondary | ICD-10-CM

## 2017-11-23 DIAGNOSIS — E860 Dehydration: Secondary | ICD-10-CM | POA: Diagnosis not present

## 2017-11-23 DIAGNOSIS — R197 Diarrhea, unspecified: Secondary | ICD-10-CM

## 2017-11-23 DIAGNOSIS — F17210 Nicotine dependence, cigarettes, uncomplicated: Secondary | ICD-10-CM

## 2017-11-23 DIAGNOSIS — R5383 Other fatigue: Secondary | ICD-10-CM

## 2017-11-23 DIAGNOSIS — R1084 Generalized abdominal pain: Secondary | ICD-10-CM | POA: Diagnosis not present

## 2017-11-23 DIAGNOSIS — IMO0002 Reserved for concepts with insufficient information to code with codable children: Secondary | ICD-10-CM

## 2017-11-23 DIAGNOSIS — R531 Weakness: Secondary | ICD-10-CM

## 2017-11-23 DIAGNOSIS — R110 Nausea: Secondary | ICD-10-CM | POA: Diagnosis not present

## 2017-11-23 DIAGNOSIS — E119 Type 2 diabetes mellitus without complications: Secondary | ICD-10-CM

## 2017-11-23 DIAGNOSIS — R109 Unspecified abdominal pain: Secondary | ICD-10-CM | POA: Insufficient documentation

## 2017-11-23 DIAGNOSIS — R6883 Chills (without fever): Secondary | ICD-10-CM | POA: Diagnosis not present

## 2017-11-23 DIAGNOSIS — E1149 Type 2 diabetes mellitus with other diabetic neurological complication: Secondary | ICD-10-CM

## 2017-11-23 LAB — POCT URINALYSIS DIPSTICK
Glucose, UA: NEGATIVE
Nitrite, UA: NEGATIVE
Protein, UA: 30
Spec Grav, UA: >=1.03 — AB (ref 1.010–1.025)
Urobilinogen, UA: 0.2 E.U./dL
pH, UA: 5.5 (ref 5.0–8.0)

## 2017-11-23 MED ORDER — METFORMIN HCL 1000 MG PO TABS: 1000.0000 mg | ORAL_TABLET | Freq: Two times a day (BID) | ORAL | 0 refills | Status: DC

## 2017-11-23 NOTE — Patient Instructions (Signed)
FOLLOW-UP INSTRUCTIONS When: if your symptoms worsen or At least one week For: Abdominal pain evaluation What to bring: All of your medications  Thank you for your visit to the [facility] today.   IF at any time your symptoms worsen or if they fail to improve please notify our clinic ASAP.

## 2017-11-23 NOTE — Progress Notes (Signed)
Internal Medicine Clinic Attending  I saw and evaluated the patient.  I personally confirmed the key portions of the history and exam documented by Dr. Berline Lopes and I reviewed pertinent patient test results.  The assessment, diagnosis, and plan were formulated together and I agree with the documentation in the resident's note. Given mod heme + on U/A will need microscopy and culture.

## 2017-11-23 NOTE — Progress Notes (Signed)
   CC: abdominal pain  HPI:  [name] is a 61 y.o. female who presents today for abdominal pain for approximately three weeks.  Abd Pain: Assessment: -Sat down to eat the last meal of the day approximately three weeks prior when the pain first began during the meal.  -Worse with ingestion of any food or even "smelling food", not as bad with fluids but still occassionally painful. Drinks water, soda and juice, pain is equally severe with each type of fluid.  -Denied vomiting and diarrhea generally. Failed pepto, malox, laxatives, melena, BRBPR, chest pain, palpitations, visual changes, dysuria, frequency, hesitancy with urination is common for the past 3 months. Denied fever   -Attested to occasional diarrhea, chills, generalized weakness and nausea present typically.  -Diabetes is well controlled today given average of 119, nothing lower than 82 one reading greater than 260. Denied EtOH use. Has gallbladder. Pain is not colicky, it is a constant dull pain that is rarely sharp. Denied using NSAIDs. Smoking-<0.5 ppd. Currently nauseous, diaphoretic,  Differential: medication induced pancreatitis, gastritis, medication side effect(Januvia), UTI  Plan: CMP, CBC, Lipase, UA Will discontinue Janumet, as this may cause pancreatitis and anorexia   UA-unremarkable, inconsistent with UTI given lack of frequency and consistent dysuria   Past Medical History:  Diagnosis Date  . Abnormal stress test   . Anxiety   . Arthritis   . Congenital blindness    L eye  . Depression   . Diabetes mellitus, type II ([place])   . Glaucoma   . Hepatitis B infection pt unsure   resolved  . HTN (hypertension)   . Hyperlipidemia   . Stroke ([place])   . Tobacco abuse    .5 PPD smoker   Review of Systems:  ROS negative except as per HPI.  Physical Exam:  Vitals:   11/23/17 1010  BP: 130/85  Pulse: 84  Temp: 98 F (36.7 C)  TempSrc: Oral  SpO2: 100%  Weight: 170 lb 3.2 oz (77.2 kg)    Physical Exam  Constitutional: She appears well-nourished. She does not appear ill.  Eyes: Conjunctivae and EOM are normal.  Neck: Normal range of motion. Neck supple.  Cardiovascular: Normal rate and regular rhythm.  No murmur heard. Pulmonary/Chest: Effort normal and breath sounds normal. No respiratory distress.  Abdominal: Soft. Bowel sounds are normal. There is tenderness.  Musculoskeletal: She exhibits no edema or tenderness.  Skin: Skin is warm. She is not diaphoretic.    Assessment & Plan:   See Encounters Tab for problem based charting.  Patient seen with Dr. Daryll Drown

## 2017-11-23 NOTE — Assessment & Plan Note (Signed)
Abd Pain: Assessment: -Sat down to eat the last meal of the day approximately three weeks prior when the pain first began during the meal.  -Worse with ingestion of any food or even "smelling food", not as bad with fluids but still occassionally painful. Drinks water, soda and juice, pain is equally severe with each type of fluid.  -Denied vomiting and diarrhea generally. Failed pepto, malox, laxatives, melena, BRBPR, chest pain, palpitations, visual changes, dysuria, frequency, hesitancy with urination is common for the past 3 months. Denied fever   -Attested to occasional diarrhea, chills, generalized weakness and nausea present typically.  -Diabetes is well controlled today given average of 119, nothing lower than 82 one reading greater than 260. Denied EtOH use. Has gallbladder. Pain is not colicky, it is a constant dull pain that is rarely sharp. Denied using NSAIDs. Smoking-<0.5 ppd. Currently nauseous, diaphoretic,  Differential: medication induced pancreatitis, gastritis, medication side effect(Januvia), UTI  Plan: CMP, CBC, Lipase, UA Will discontinue Janumet, as this may cause pancreatitis and anorexia   UA-unremarkable, inconsistent with UTI given lack of frequency and consistent dysuria

## 2017-11-23 NOTE — Progress Notes (Signed)
Reflex microscopy and urine culture ordered along with urine protein and microalbmumin creatinine given renal injury. BMP reviewed and noted to have a Cr of  Significantly above her baseline. Also noted a decreased GFR to 50.  Lipase was WNL's CBC indicated persistent MCV below the lower limits of normal at 69. Patient's Hgb WNL's. Patient was called and she will visit the clinic today for a liter of normal saline and stat repeat labs as she remains unable to tolerate fluids or food at this time and continue to show signs of worsening dehydration.   Orders have been placed for: 1 liter NS Stat BMP Urine reflex microscopic  Urine protein and microalbumin/cr

## 2017-11-24 ENCOUNTER — Encounter: Payer: Self-pay | Admitting: Internal Medicine

## 2017-11-24 ENCOUNTER — Ambulatory Visit (INDEPENDENT_AMBULATORY_CARE_PROVIDER_SITE_OTHER): Payer: Medicare Other | Admitting: Internal Medicine

## 2017-11-24 ENCOUNTER — Ambulatory Visit (HOSPITAL_COMMUNITY)
Admission: RE | Admit: 2017-11-24 | Discharge: 2017-11-24 | Disposition: A | Discharge status: Home / Self Care | Payer: Medicare Other | Source: Ambulatory Visit | Attending: Internal Medicine | Admitting: Internal Medicine

## 2017-11-24 VITALS — BP 129/93 | HR 96 | Temp 97.5°F | Ht 67.0 in | Wt 167.5 lb

## 2017-11-24 DIAGNOSIS — R630 Anorexia: Secondary | ICD-10-CM | POA: Insufficient documentation

## 2017-11-24 DIAGNOSIS — R1084 Generalized abdominal pain: Secondary | ICD-10-CM

## 2017-11-24 DIAGNOSIS — E860 Dehydration: Secondary | ICD-10-CM

## 2017-11-24 DIAGNOSIS — E1149 Type 2 diabetes mellitus with other diabetic neurological complication: Secondary | ICD-10-CM

## 2017-11-24 DIAGNOSIS — R339 Retention of urine, unspecified: Secondary | ICD-10-CM | POA: Diagnosis not present

## 2017-11-24 DIAGNOSIS — R5383 Other fatigue: Secondary | ICD-10-CM | POA: Diagnosis not present

## 2017-11-24 DIAGNOSIS — E1165 Type 2 diabetes mellitus with hyperglycemia: Secondary | ICD-10-CM | POA: Diagnosis not present

## 2017-11-24 DIAGNOSIS — IMO0002 Reserved for concepts with insufficient information to code with codable children: Secondary | ICD-10-CM

## 2017-11-24 LAB — URINALYSIS, ROUTINE W REFLEX MICROSCOPIC
Bilirubin Urine: NEGATIVE
Glucose, UA: NEGATIVE mg/dL
Ketones, ur: 5 mg/dL — AB
Nitrite: NEGATIVE
Protein, ur: NEGATIVE mg/dL
Specific Gravity, Urine: 1.016 (ref 1.005–1.030)
pH: 5 (ref 5.0–8.0)

## 2017-11-24 LAB — CBC
Hematocrit: 41.6 % (ref 34.0–46.6)
Hemoglobin: 13.4 g/dL (ref 11.1–15.9)
MCH: 22.3 pg — ABNORMAL LOW (ref 26.6–33.0)
MCHC: 32.2 g/dL (ref 31.5–35.7)
MCV: 69 fL — ABNORMAL LOW (ref 79–97)
Platelets: 339 10*3/uL (ref 150–379)
RBC: 6.01 x10E6/uL — ABNORMAL HIGH (ref 3.77–5.28)
RDW: 18.1 % — ABNORMAL HIGH (ref 12.3–15.4)
WBC: 9.4 10*3/uL (ref 3.4–10.8)

## 2017-11-24 LAB — CMP14 + ANION GAP
ALT: 16 IU/L (ref 0–32)
AST: 21 IU/L (ref 0–40)
Albumin/Globulin Ratio: 1.7 (ref 1.2–2.2)
Albumin: 4.6 g/dL (ref 3.6–4.8)
Alkaline Phosphatase: 89 IU/L (ref 39–117)
Anion Gap: 17 mmol/L (ref 10.0–18.0)
BUN/Creatinine Ratio: 8 — ABNORMAL LOW (ref 12–28)
BUN: 10 mg/dL (ref 8–27)
Bilirubin Total: 0.2 mg/dL (ref 0.0–1.2)
CO2: 20 mmol/L (ref 20–29)
Calcium: 10.8 mg/dL — ABNORMAL HIGH (ref 8.7–10.3)
Chloride: 101 mmol/L (ref 96–106)
Creatinine, Ser: 1.31 mg/dL — ABNORMAL HIGH (ref 0.57–1.00)
GFR calc Af Amer: 50 mL/min/{1.73_m2} — ABNORMAL LOW (ref >59)
GFR calc non Af Amer: 44 mL/min/{1.73_m2} — ABNORMAL LOW (ref >59)
Globulin, Total: 2.7 g/dL (ref 1.5–4.5)
Glucose: 112 mg/dL — ABNORMAL HIGH (ref 65–99)
Potassium: 4.3 mmol/L (ref 3.5–5.2)
Sodium: 138 mmol/L (ref 134–144)
Total Protein: 7.3 g/dL (ref 6.0–8.5)

## 2017-11-24 LAB — BASIC METABOLIC PANEL
Anion gap: 11 (ref 5–15)
BUN: 14 mg/dL (ref 6–20)
CO2: 21 mmol/L — ABNORMAL LOW (ref 22–32)
Calcium: 10.3 mg/dL (ref 8.9–10.3)
Chloride: 105 mmol/L (ref 101–111)
Creatinine, Ser: 1.29 mg/dL — ABNORMAL HIGH (ref 0.44–1.00)
GFR calc Af Amer: 50 mL/min — ABNORMAL LOW (ref >60)
GFR calc non Af Amer: 43 mL/min — ABNORMAL LOW (ref >60)
Glucose, Bld: 155 mg/dL — ABNORMAL HIGH (ref 65–99)
Potassium: 3.6 mmol/L (ref 3.5–5.1)
Sodium: 137 mmol/L (ref 135–145)

## 2017-11-24 LAB — LIPASE: Lipase: 18 U/L (ref 14–72)

## 2017-11-24 LAB — PROTEIN, URINE, RANDOM: Total Protein, Urine: 24 mg/dL

## 2017-11-24 MED ORDER — ONDANSETRON 4 MG PO TBDP: 4.0000 mg | ORAL_TABLET | Freq: Three times a day (TID) | ORAL | 0 refills | Status: DC | PRN

## 2017-11-24 MED ORDER — SODIUM CHLORIDE 0.9 % IV BOLUS (SEPSIS)
1000.0000 mL | Freq: Once | INTRAVENOUS | Status: AC
  Administered 2017-11-24: 1000 mL via INTRAVENOUS

## 2017-11-24 MED ORDER — BARIUM SULFATE 2.1 % PO SUSP
ORAL | Status: AC
  Filled 2017-11-24: qty 2

## 2017-11-24 NOTE — Addendum Note (Signed)
Addended by: Nicola Girt on: 11/24/2017 09:41 AM   Modules accepted: Orders

## 2017-11-24 NOTE — Assessment & Plan Note (Signed)
Abdominal pain: Assessment: Abdominal pain is approximately the same for the past three weeks, no changes of the past few days. Continuously not for three weeks. Eating prompts severe abdominal pain followed shortly by diarrhea with emptying of her bowels. Denied blood in her stool, dark stool, floating stool. Does not recall mucus, blood, general shape, just adds that this looks to be "all torn up".  Still having issues initiating urinary stream which is ongoing for the past 4-5 months. Associated with mild dysuria.  Plan: Complete a one liter normal saline fluid bolus today. Repeat BMP-----BMP indicates stable Cr at 1.29, stable GFR with BUN of 14. Post void residual volume of 80ml noted. Urine unremarkable.  Patient able to tolerate PO liquids during visit wherein she received a liter of normal saline. She was given clear directions on increasing her PO liquid consumption with fluids other than soda, soups, broths, and powerade/gatorade/pedialyte(insturcted to drink one additional part water for each such drink).  CT-Abdomen and pelvis with contrast ordered for tomorrow. Instructed to return if her symptoms worsened.

## 2017-11-24 NOTE — Patient Instructions (Signed)
I need you to take a sip of water every 15 minutes. In between this sip, please take a sip of gatorade or poweraid or Pedialyte. In addition, please continue to try small servings of broth or thin soup without crackers until such time that you feel much better.   IF at any time your symptoms worsen or fail to improve please call us.  I want to have our clinic see you back in here next Monday.  Thank you for your visit to the [facility] today.

## 2017-11-24 NOTE — Addendum Note (Signed)
Addended by: Truddie Crumble on: 11/24/2017 10:39 AM   Modules accepted: Orders

## 2017-11-24 NOTE — Progress Notes (Signed)
CC: I can't eat or drink  HPI:  [name] is a 61 y.o. female who presents today for evaluation of her hydration status. She has experienced abdominal pain for three weeks. The pain is associated with anorexia that has worsened to the extent that she can no longer tolerate solid PO intake and only minimal oral fluids. BMP from the prior day indicated an increase in her Cr to 1.31 from 1.02 previously. She is normally below 1.0. In addition, her chloride level was increased but sodium and potassium were WNL's. UA indicated moderate RBC's in her urine prompting reflex microscopy and protein studies. She did not demonstrate a leukocytosis but her MCV was markedly below normal but ultimately near her recent values.   Abdominal pain: Assessment: Abdominal pain is approximately the same for the past three weeks, no changes of the past few days. Continuously not for three weeks. Eating prompts severe abdominal pain followed shortly by diarrhea with emptying of her bowels. Denied blood in her stool, dark stool, floating stool. Does not recall mucus, blood, general shape, just adds that this looks to be "all torn up".  Still having issues initiating urinary stream which is ongoing for the past 4-5 months. Associated with mild dysuria.  Plan: Complete a one liter normal saline fluid bolus today. Repeat BMP-----BMP indicates stable Cr at 1.29, stable GFR with BUN of 14. Post void residual volume of 76ml noted. Urine unremarkable.  Patient able to tolerate PO liquids during visit wherein she received a liter of normal saline. She was given clear directions on increasing her PO liquid consumption with fluids other than soda, soups, broths, and powerade/gatorade/pedialyte(insturcted to drink one additional part water for each such drink).  CT-Abdomen and pelvis with contrast ordered for tomorrow. Instructed to return if her symptoms worsened.   Food aversion: Assessment: Patient is still unable  to eat or drink today. Attested to a previous episode of anorexia for one month with similar aversion, without the pain and diarrhea. Lack of desire to eat at that time.  Patient again without desire to eat, but with pain and diarrhea.   Plan: See above   Past Medical History:  Diagnosis Date  . Abnormal stress test   . Anxiety   . Arthritis   . Congenital blindness    L eye  . Depression   . Diabetes mellitus, type II ([place])   . Glaucoma   . Hepatitis B infection pt unsure   resolved  . HTN (hypertension)   . Hyperlipidemia   . Stroke ([place])   . Tobacco abuse    .5 PPD smoker   Review of Systems:  ROS negative except as per HPI.  Physical Exam:  Vitals:   11/24/17 1121  BP: (!) 129/93  Pulse: 96  Temp: (!) 97.5 F (36.4 C)  TempSrc: Oral  SpO2: 98%  Weight: 167 lb 8 oz (76 kg)  Height: 5\' 7"  (1.702 m)   Physical Exam  Constitutional: She is oriented to person, place, and time. She appears well-nourished. No distress.  Eyes: Conjunctivae are normal.  Neck: Normal range of motion. Neck supple.  Cardiovascular: Normal rate and regular rhythm.  Pulmonary/Chest: Effort normal and breath sounds normal. No respiratory distress.  Abdominal: Soft. Bowel sounds are normal. She exhibits no distension, no fluid wave, no ascites and no mass. There is tenderness (generalized throughout). There is no rebound.  Musculoskeletal: She exhibits no edema or tenderness.  Neurological: She is alert and oriented to person, place,  and time.  Skin: Skin is warm. Capillary refill takes less than 2 seconds. She is not diaphoretic.  Psychiatric: She has a normal mood and affect.    Assessment & Plan:   See Encounters Tab for problem based charting.  Patient seen with Dr. Daryll Drown

## 2017-11-25 ENCOUNTER — Encounter (HOSPITAL_COMMUNITY): Payer: Self-pay

## 2017-11-25 ENCOUNTER — Other Ambulatory Visit: Payer: Self-pay | Admitting: Internal Medicine

## 2017-11-25 ENCOUNTER — Ambulatory Visit (HOSPITAL_COMMUNITY)
Admission: RE | Admit: 2017-11-25 | Discharge: 2017-11-25 | Disposition: A | Discharge status: Home / Self Care | Payer: Medicare Other | Source: Ambulatory Visit | Attending: Internal Medicine | Admitting: Internal Medicine

## 2017-11-25 DIAGNOSIS — N2889 Other specified disorders of kidney and ureter: Secondary | ICD-10-CM | POA: Insufficient documentation

## 2017-11-25 DIAGNOSIS — R1084 Generalized abdominal pain: Secondary | ICD-10-CM

## 2017-11-25 DIAGNOSIS — R109 Unspecified abdominal pain: Secondary | ICD-10-CM | POA: Diagnosis not present

## 2017-11-25 LAB — MICROALBUMIN / CREATININE URINE RATIO
Creatinine, Urine: 185.9 mg/dL
Microalb Creat Ratio: 17.1 mg/g creat (ref 0.0–30.0)
Microalb, Ur: 31.8 ug/mL — ABNORMAL HIGH

## 2017-11-25 MED ORDER — IOPAMIDOL (ISOVUE-300) INJECTION 61%
100.0000 mL | Freq: Once | INTRAVENOUS | Status: AC | PRN
  Administered 2017-11-25: 80 mL via INTRAVENOUS

## 2017-11-25 MED ORDER — IOPAMIDOL (ISOVUE-300) INJECTION 61%: 100.0000 mL | Freq: Once | INTRAVENOUS | Status: DC | PRN

## 2017-11-25 NOTE — Progress Notes (Signed)
Internal Medicine Clinic Attending  I saw and evaluated the patient.  I personally confirmed the key portions of the history and exam documented by Dr. Harbrecht and I reviewed pertinent patient test results.  The assessment, diagnosis, and plan were formulated together and I agree with the documentation in the resident's note.  

## 2017-11-26 LAB — URINE CULTURE

## 2017-11-26 MED ORDER — AMPICILLIN 250 MG PO CAPS: 250.0000 mg | ORAL_CAPSULE | Freq: Four times a day (QID) | ORAL | 0 refills | Status: DC

## 2017-11-26 NOTE — Addendum Note (Signed)
Addended by: Nicola Girt on: 11/26/2017 06:50 AM   Modules accepted: Orders

## 2017-11-28 DIAGNOSIS — H401113 Primary open-angle glaucoma, right eye, severe stage: Secondary | ICD-10-CM | POA: Diagnosis not present

## 2017-11-28 DIAGNOSIS — Z961 Presence of intraocular lens: Secondary | ICD-10-CM | POA: Diagnosis not present

## 2017-12-01 ENCOUNTER — Telehealth: Payer: Self-pay | Admitting: *Deleted

## 2017-12-01 DIAGNOSIS — N200 Calculus of kidney: Secondary | ICD-10-CM

## 2017-12-01 NOTE — Telephone Encounter (Signed)
Pt called and wants results from labs, please call her at her listed #, thank you

## 2017-12-01 NOTE — Progress Notes (Addendum)
Patient was called and scheduled for 12/01/2017 in the clinic. CT abdomen indicated multiple renal stones with largest >62mm.  -Please place patient on tamsulosin starting dose and other appropriate medications as indicated by evaluation  -BMP for electrolytes abnormalities given difficulty with urination and decreased PO intake. -Patient has been experiencing nausea, vomiting, difficulty with solid foods and minimal liquid intake.  -Placed consult to Urology due to CT demonstrating renal stones. Please check on the progress of this consult.

## 2017-12-02 ENCOUNTER — Ambulatory Visit (INDEPENDENT_AMBULATORY_CARE_PROVIDER_SITE_OTHER): Payer: Medicare Other | Admitting: Internal Medicine

## 2017-12-02 ENCOUNTER — Other Ambulatory Visit: Payer: Self-pay | Admitting: Internal Medicine

## 2017-12-02 VITALS — BP 137/82 | HR 70 | Temp 97.4°F | Ht 67.0 in | Wt 175.9 lb

## 2017-12-02 DIAGNOSIS — R109 Unspecified abdominal pain: Secondary | ICD-10-CM

## 2017-12-02 DIAGNOSIS — R1084 Generalized abdominal pain: Secondary | ICD-10-CM | POA: Diagnosis not present

## 2017-12-02 DIAGNOSIS — N200 Calculus of kidney: Secondary | ICD-10-CM

## 2017-12-02 DIAGNOSIS — R1013 Epigastric pain: Secondary | ICD-10-CM

## 2017-12-02 MED ORDER — TAMSULOSIN HCL 0.4 MG PO CAPS: 0.4000 mg | ORAL_CAPSULE | Freq: Every day | ORAL | 0 refills | Status: AC

## 2017-12-02 NOTE — Assessment & Plan Note (Addendum)
Abdominal pain is approximately the same for the past month, no recent changes. Pain continues to be worse with eating. She states she gets a feeling of relief when eating ice. Denies bloody or dark stool. States she is eating about a half a sandwich per day and is drinking a glass of water per day due to the pain, but will try to eat more, denies symptoms of hypovolumemia and is normotensive today. Will avoid NSAID's in the setting of decreased oral intake.  Continues to have issues initiating urinary stream which is ongoing for the past 4-5 months. Associated with mild dysuria.  BMP 11/23/17 indicated stable Cr at 1.29, stable GFR with BUN of 14. U/A at previous visit showed new blood in urine and CT abdomen showed renal stones in right kidney and scarring bilat R>L.   - Follow up with urology scheduled for 12/07/17 - Tamsulosin 0.4 for 2 weeks, will see what urology recommeds - Repeat BMP Today - Continue tylenol for pain control ADDENDUM: - Patient seen by urology, who do not believe her renal stones are contributing to her pain. They treated her for beta-strep UTI and provided some pain medication. They recommend GI consult.

## 2017-12-02 NOTE — Progress Notes (Signed)
Internal Medicine Clinic Attending  I saw and evaluated the patient.  I personally confirmed the key portions of the history and exam documented by Dr. Melvin and I reviewed pertinent patient test results.  The assessment, diagnosis, and plan were formulated together and I agree with the documentation in the resident's note.  

## 2017-12-02 NOTE — Progress Notes (Signed)
   CC: Abdominal Pain, Renal Calculi  HPI:  [name] is a 61 y.o. F with PMHx listed below presenting for Abdominal Pain, Renal Calculi. Please see the A&P for the status of the patient's chronic medical problems.   Past Medical History:  Diagnosis Date  . Abnormal stress test   . Anxiety   . Arthritis   . Congenital blindness    L eye  . Depression   . Diabetes mellitus, type II ([place])   . Glaucoma   . Hepatitis B infection pt unsure   resolved  . HTN (hypertension)   . Hyperlipidemia   . Stroke ([place])   . Tobacco abuse    .5 PPD smoker   Review of Systems:  Performed and negative except as otherwise indicated.   Physical Exam:  Vitals:   12/02/17 0955  BP: 137/82  Pulse: 70  Temp: (!) 97.4 F (36.3 C)  TempSrc: Oral  SpO2: 97%  Weight: 175 lb 14.4 oz (79.8 kg)  Height: 5\' 7"  (1.702 m)   Physical Exam  Constitutional: She appears well-developed and well-nourished.  Cardiovascular: Normal rate, regular rhythm and normal heart sounds.  Pulmonary/Chest: Effort normal and breath sounds normal. No respiratory distress.  Abdominal: Soft. Bowel sounds are normal. There is no tenderness. There is no rebound and no guarding.  Negative murphy's sign     Assessment & Plan:   See Encounters Tab for problem based charting.  Patient seen with Dr. Lynnae January

## 2017-12-02 NOTE — Patient Instructions (Signed)
Thank you for allowing [place] to care for you.  For your abdominal pain and kidney stones. - We have ordered a medication, Tamsulosin, to help any stones pass and may help with your pain as well - Continue to take tylenol for pain - Try to stay well hydrated, drinking at least 4 glass of water per day - Try to increased your food intake - We obtained labs today to evaluate your kidney function, you will be called with abnormal results and mailed a letter with normal results. - Follow up with urology as scheduled  Return to see your PCP as scheduled in February

## 2017-12-03 LAB — BMP8+ANION GAP
Anion Gap: 14 mmol/L (ref 10.0–18.0)
BUN/Creatinine Ratio: 10 — ABNORMAL LOW (ref 12–28)
BUN: 10 mg/dL (ref 8–27)
CO2: 23 mmol/L (ref 20–29)
Calcium: 10.2 mg/dL (ref 8.7–10.3)
Chloride: 102 mmol/L (ref 96–106)
Creatinine, Ser: 0.96 mg/dL (ref 0.57–1.00)
GFR calc Af Amer: 73 mL/min/{1.73_m2} (ref >59)
GFR calc non Af Amer: 64 mL/min/{1.73_m2} (ref >59)
Glucose: 166 mg/dL — ABNORMAL HIGH (ref 65–99)
Potassium: 4.4 mmol/L (ref 3.5–5.2)
Sodium: 139 mmol/L (ref 134–144)

## 2017-12-05 ENCOUNTER — Other Ambulatory Visit: Payer: Self-pay | Admitting: Student in an Organized Health Care Education/Training Program

## 2017-12-07 ENCOUNTER — Encounter: Payer: Self-pay | Admitting: Internal Medicine

## 2017-12-07 MED ORDER — GLUCOSE BLOOD VI STRP: ORAL_STRIP | 11 refills | Status: DC

## 2017-12-07 NOTE — Progress Notes (Signed)
Patient sent letter notifying her of normal test result.

## 2017-12-08 DIAGNOSIS — R1033 Periumbilical pain: Secondary | ICD-10-CM | POA: Diagnosis not present

## 2017-12-08 DIAGNOSIS — N200 Calculus of kidney: Secondary | ICD-10-CM | POA: Diagnosis not present

## 2017-12-09 ENCOUNTER — Other Ambulatory Visit: Payer: Self-pay | Admitting: Student in an Organized Health Care Education/Training Program

## 2017-12-09 NOTE — Telephone Encounter (Signed)
Called pt and she states she cannot get her test strips, called [facility] and pharmacist states pt's insurance stated that a pharm in [place] had filled the test strips and pt cannot get more until dec 29. Pharmacist will call medicaid back and get info to call pharm and file possible fraud with medicaid as pt states she has not been anywhere recently and that [facility] always fills all her meds. Pharm will call back monday

## 2017-12-09 NOTE — Telephone Encounter (Signed)
Pt unable to get her [employer] Testing strips until next month and would like a call back as soon as possible.

## 2017-12-12 DIAGNOSIS — N200 Calculus of kidney: Secondary | ICD-10-CM | POA: Diagnosis not present

## 2017-12-16 ENCOUNTER — Other Ambulatory Visit: Payer: Self-pay | Admitting: Internal Medicine

## 2017-12-16 DIAGNOSIS — E1140 Type 2 diabetes mellitus with diabetic neuropathy, unspecified: Secondary | ICD-10-CM

## 2017-12-16 DIAGNOSIS — E1165 Type 2 diabetes mellitus with hyperglycemia: Principal | ICD-10-CM

## 2017-12-16 DIAGNOSIS — Z794 Long term (current) use of insulin: Principal | ICD-10-CM

## 2017-12-16 DIAGNOSIS — IMO0002 Reserved for concepts with insufficient information to code with codable children: Secondary | ICD-10-CM

## 2017-12-16 NOTE — Addendum Note (Signed)
Addended by: Forde Dandy on: 12/16/2017 08:47 AM   Modules accepted: Orders

## 2017-12-30 ENCOUNTER — Telehealth: Payer: Self-pay | Admitting: Internal Medicine

## 2017-12-30 DIAGNOSIS — R109 Unspecified abdominal pain: Secondary | ICD-10-CM

## 2017-12-30 NOTE — Telephone Encounter (Signed)
Patient called in requesting a Referral to [facility].  Patient states she is still having abdominal pain since her 12/02/2017 visit .  Per [facility] please send a referral and they will make an appointment with her since she is a current patient.

## 2018-01-11 ENCOUNTER — Ambulatory Visit (HOSPITAL_COMMUNITY)
Admission: RE | Admit: 2018-01-11 | Discharge: 2018-01-11 | Disposition: A | Discharge status: Home / Self Care | Payer: Medicare Other | Source: Ambulatory Visit | Attending: Internal Medicine | Admitting: Internal Medicine

## 2018-01-11 ENCOUNTER — Ambulatory Visit (INDEPENDENT_AMBULATORY_CARE_PROVIDER_SITE_OTHER): Payer: Medicare Other | Admitting: Internal Medicine

## 2018-01-11 ENCOUNTER — Other Ambulatory Visit: Payer: Self-pay

## 2018-01-11 VITALS — BP 143/79 | HR 70 | Temp 97.9°F | Ht 67.0 in | Wt 170.8 lb

## 2018-01-11 DIAGNOSIS — M189 Osteoarthritis of first carpometacarpal joint, unspecified: Secondary | ICD-10-CM | POA: Diagnosis not present

## 2018-01-11 DIAGNOSIS — N200 Calculus of kidney: Secondary | ICD-10-CM

## 2018-01-11 DIAGNOSIS — E119 Type 2 diabetes mellitus without complications: Secondary | ICD-10-CM

## 2018-01-11 DIAGNOSIS — R109 Unspecified abdominal pain: Secondary | ICD-10-CM

## 2018-01-11 DIAGNOSIS — K5900 Constipation, unspecified: Secondary | ICD-10-CM | POA: Diagnosis not present

## 2018-01-11 DIAGNOSIS — M25532 Pain in left wrist: Secondary | ICD-10-CM | POA: Insufficient documentation

## 2018-01-11 DIAGNOSIS — R350 Frequency of micturition: Secondary | ICD-10-CM | POA: Diagnosis not present

## 2018-01-11 DIAGNOSIS — R634 Abnormal weight loss: Secondary | ICD-10-CM

## 2018-01-11 DIAGNOSIS — S6992XA Unspecified injury of left wrist, hand and finger(s), initial encounter: Secondary | ICD-10-CM | POA: Diagnosis not present

## 2018-01-11 DIAGNOSIS — R3915 Urgency of urination: Secondary | ICD-10-CM | POA: Insufficient documentation

## 2018-01-11 DIAGNOSIS — R197 Diarrhea, unspecified: Secondary | ICD-10-CM

## 2018-01-11 DIAGNOSIS — R110 Nausea: Secondary | ICD-10-CM

## 2018-01-11 DIAGNOSIS — M19042 Primary osteoarthritis, left hand: Secondary | ICD-10-CM | POA: Diagnosis not present

## 2018-01-11 DIAGNOSIS — Z6826 Body mass index (BMI) 26.0-26.9, adult: Secondary | ICD-10-CM | POA: Diagnosis not present

## 2018-01-11 DIAGNOSIS — R1084 Generalized abdominal pain: Secondary | ICD-10-CM

## 2018-01-11 DIAGNOSIS — R300 Dysuria: Secondary | ICD-10-CM

## 2018-01-11 DIAGNOSIS — M79642 Pain in left hand: Secondary | ICD-10-CM | POA: Diagnosis not present

## 2018-01-11 DIAGNOSIS — Z720 Tobacco use: Secondary | ICD-10-CM

## 2018-01-11 DIAGNOSIS — Z87440 Personal history of urinary (tract) infections: Secondary | ICD-10-CM

## 2018-01-11 NOTE — Assessment & Plan Note (Addendum)
Assessment She endorses urinary urgency, urinary frequency, and intermittent dysuria for the last month. Denies fevers. She was recently treated for beta strep UTI by urology. CT abdomen pelvis is positive for renal calculi. Exam negative for CVA tenderness. She is afebrile. She has been seen by urology, who does not think that the renal stones are the cause of her abdominal pain. Urine dipstick performed in clinic showed moderate leukocytes, negative nitrites, and negative protein. We will check a UA and urine culture and treat pending results.  Plan - UA with reflex - Urine culture  ADDENDUM: UA and culture returned with beta-strep UTI. Called patient to inform her of this result. She stated that she had just seen Urologists last week and that they prescribed her another antibiotic. She had previously received ampicillin for initial beta-strep UTI on 12/12 and this time they prescribed her nitrofurantoin (she just picked this medication up yesterday). She reports continued abdominal pain. I recommended that she complete this course of antibiotics as UTIs are a possible cause of abdominal pain. She reports that she has an appointment with GI on 1/31 and that her pain is bad and she is not sure if she can wait that long. I advised that unfortunately, I do not have control over when GI is able to see her but if she continues to be concerned, I recommended that she call their office to ask if she can be seen sooner. I also suggested that she is welcome to return to our clinic if needed. She expressed understanding and will plan to follow up with GI.

## 2018-01-11 NOTE — Assessment & Plan Note (Addendum)
Assessment She endorses constant abdominal pain for the last 3 months with associated decreased appetite and nausea. She endorses 15 pound weight loss in the last 3 months. According to our records, she has lost 14 pounds since September 2018. She endorses alternating constipation and diarrhea. The pain is worse with eating. She has been able to tolerate fluids but has not been able to eat much solid food. Denies blood in her stool or dark tarry stools. She has tried Tylenol, nausea medication, and Maalox with no improvement in her symptoms. She states she has seen a gastroenterologist before and has had a colonoscopy in the past. She states that she had 2 polyps removed but was otherwise normal. She denies having had an endoscopy before. She denies family history of colon cancer or gastric cancer. She has been seen by urology who do not think that her renal calculi are causing her pain. She does have an appointment with [facility] scheduled for tomorrow.  I am not sure of the exact etiology of her abdominal pain at this time. We will have her follow with GI as scheduled.  Plan - Appointment scheduled with GI for tomorrow - Return to clinic in 1 month with PCP

## 2018-01-11 NOTE — Progress Notes (Signed)
   CC: Abdominal pain for 3 months  HPI:  [name] is a 62 y.o. female with PMH significant for hypertension, type 2 diabetes, tobacco use, and depression/anxiety who presents with abdominal pain for the last 3 months. She also states she fell yesterday and has been having 8/10 left wrist pain.  Abdominal pain: 11/28 - constant dull abdominal pain for 3 weeks, occasional nonbloody diarrhea, chills, weakness and nausea. Worse after eating, followed shortly by diarrhea with emptying of her bowels. Normal lipase 11/29 - called back to clinic for history of AKI 11/30 - CT abdomen and pelvis with no acute finding or reason for pain. Bilateral renal cortical scarring, greater on the right, with multiple stones measuring up to 11 mm. 12/7 - return to clinic with continued abdominal pain. Started on tamsulosin 0.4 for 2 weeks. Creatinine back to baseline 12/12 - seen by urology, who do not believe her renal stones are contributing to her pain. Treated for beta-strep UTI  Left wrist pain: Yesterday, she states she missed a step and she fell on her left wrist. She endorses 8 out of 10 left wrist pain over the ulnar aspect. She denies other pain. She denies hitting her head or losing consciousness.  Urinary symptoms: She endorses urinary urgency, urinary frequency, and intermittent dysuria for the last month. Denies fevers. She has seen urology, who treated her for a beta-strep UTI. She states she completed the antibiotic course as prescribed.  Past Medical History:  Diagnosis Date  . Abnormal stress test   . Anxiety   . Arthritis   . Congenital blindness    L eye  . Depression   . Diabetes mellitus, type II ([place])   . Glaucoma   . Hepatitis B infection pt unsure   resolved  . HTN (hypertension)   . Hyperlipidemia   . Stroke ([place])   . Tobacco abuse    .5 PPD smoker   Review of Systems:   GEN: Negative for fevers. Positive for 15lb weight loss over last 3 months. CV: Negative for  chest pain PULM: Negative for SOB or cough ABD: Positive for generalized constant abdominal pain for the last 3 months. Positive for decreased appetite. Positive for nausea. Negative for diarrhea. Positive for alternating NB constipation and diarrhea. GU: Positive for urinary urgency, urinary frequency, and intermittent dysuria.  Physical Exam:  Vitals:   01/11/18 1043  BP: (!) 143/79  Pulse: 70  Temp: 97.9 F (36.6 C)  TempSrc: Oral  SpO2: 100%  Weight: 170 lb 12.8 oz (77.5 kg)  Height: 5\' 7"  (1.702 m)   GEN: Sitting in chair comfortably in NAD HEENT: MMM, PERRL. CV: NR & RR, no m/r/g BACK: No CVA tenderness ABD: Diffuse tenderness to palpation, most tender in lower quadrants. Decreased bowel sounds. Soft, non-distended. No rebound or guarding. MSK: No LE edema. TTP of ulnar aspect of dorsal left wrist, over ulnar styloid. No erythema, swelling, or warmth of the area. Mild pain in dorsal left wrist with movement of 4th and 5th fingers. Normal sensation in fingers on left hand and 2+ radial pulse on left.  Assessment & Plan:   See Encounters Tab for problem based charting.  Patient discussed with Dr. Ellison Carwin, MD Internal Medicine, PGY-1

## 2018-01-11 NOTE — Assessment & Plan Note (Signed)
Assessment After a mechanical fall in 1/15. Tender to palpation over ulnar styloid and mild pain with moving fourth and fifth fingers on the left hand. Neurovascularly intact. Will get imaging to assess for fracture.  Plan - Left wrist and hand x-rays - Tylenol PRN for pain

## 2018-01-11 NOTE — Patient Instructions (Addendum)
FOLLOW-UP INSTRUCTIONS When: 1 month For: health maintenance with PCP What to bring: medications, glucometer   Susan Holmes,  It was a pleasure to meet you today.  I am not sure exactly what is causing your abdominal pain. I think it will be best to go see the Gastroenterology doctors tomorrow to see if you might need more tests or imaging.  For the pain in your wrist, we are getting some x-rays to make sure it is not fractured. I will call you with the results. You can take tylenol as needed for the pain.  For your feeling or having to go to the bathroom a lot and some pain with peeing, we are running some more tests on your urine. I will call you if the results are abnormal and if you need a medication.  Please make sure to go to the GI doctors tomorrow as scheduled. Please schedule an appointment to see your PCP here in 1 month.

## 2018-01-12 ENCOUNTER — Telehealth: Payer: Self-pay

## 2018-01-12 DIAGNOSIS — N200 Calculus of kidney: Secondary | ICD-10-CM | POA: Diagnosis not present

## 2018-01-12 DIAGNOSIS — R3914 Feeling of incomplete bladder emptying: Secondary | ICD-10-CM | POA: Diagnosis not present

## 2018-01-12 LAB — URINALYSIS, ROUTINE W REFLEX MICROSCOPIC
Bilirubin, UA: NEGATIVE
Glucose, UA: NEGATIVE
Ketones, UA: NEGATIVE
Nitrite, UA: NEGATIVE
Protein, UA: NEGATIVE
RBC, UA: NEGATIVE
Specific Gravity, UA: 1.016 (ref 1.005–1.030)
Urobilinogen, Ur: 0.2 mg/dL (ref 0.2–1.0)
pH, UA: 5.5 (ref 5.0–7.5)

## 2018-01-12 LAB — MICROSCOPIC EXAMINATION: Casts: NONE SEEN /lpf

## 2018-01-12 NOTE — Telephone Encounter (Signed)
Requesting xray result. Please call pt back. 

## 2018-01-13 LAB — URINE CULTURE

## 2018-01-13 NOTE — Telephone Encounter (Signed)
Called patient back to let her know that her left wrist and hand x-rays showed no evidence of fracture.

## 2018-01-17 NOTE — Progress Notes (Signed)
Internal Medicine Clinic Attending  Case discussed with Dr. Huang at the time of the visit.  We reviewed the resident's history and exam and pertinent patient test results.  I agree with the assessment, diagnosis, and plan of care documented in the resident's note. 

## 2018-01-18 ENCOUNTER — Other Ambulatory Visit (HOSPITAL_COMMUNITY): Payer: Self-pay | Admitting: Physician Assistant

## 2018-01-18 DIAGNOSIS — R198 Other specified symptoms and signs involving the digestive system and abdomen: Secondary | ICD-10-CM | POA: Diagnosis not present

## 2018-01-18 DIAGNOSIS — D509 Iron deficiency anemia, unspecified: Secondary | ICD-10-CM | POA: Diagnosis not present

## 2018-01-18 DIAGNOSIS — R110 Nausea: Secondary | ICD-10-CM | POA: Diagnosis not present

## 2018-01-18 DIAGNOSIS — R1084 Generalized abdominal pain: Secondary | ICD-10-CM | POA: Diagnosis not present

## 2018-01-18 DIAGNOSIS — Z86010 Personal history of colonic polyps: Secondary | ICD-10-CM | POA: Diagnosis not present

## 2018-01-18 DIAGNOSIS — R634 Abnormal weight loss: Secondary | ICD-10-CM

## 2018-01-24 ENCOUNTER — Encounter (HOSPITAL_COMMUNITY)
Admission: RE | Admit: 2018-01-24 | Discharge: 2018-01-24 | Disposition: A | Discharge status: Home / Self Care | Payer: Medicare Other | Source: Ambulatory Visit | Attending: Physician Assistant | Admitting: Physician Assistant

## 2018-01-24 DIAGNOSIS — R634 Abnormal weight loss: Secondary | ICD-10-CM | POA: Insufficient documentation

## 2018-01-24 DIAGNOSIS — R110 Nausea: Secondary | ICD-10-CM | POA: Diagnosis not present

## 2018-01-24 MED ORDER — TECHNETIUM TC 99M SULFUR COLLOID
2.1000 | Freq: Once | INTRAVENOUS | Status: AC | PRN
  Administered 2018-01-24: 2.1 via INTRAVENOUS

## 2018-01-25 DIAGNOSIS — K2930 Chronic superficial gastritis without bleeding: Secondary | ICD-10-CM | POA: Diagnosis not present

## 2018-01-25 DIAGNOSIS — K259 Gastric ulcer, unspecified as acute or chronic, without hemorrhage or perforation: Secondary | ICD-10-CM | POA: Diagnosis not present

## 2018-01-25 DIAGNOSIS — R1084 Generalized abdominal pain: Secondary | ICD-10-CM | POA: Diagnosis not present

## 2018-01-25 DIAGNOSIS — R110 Nausea: Secondary | ICD-10-CM | POA: Diagnosis not present

## 2018-01-27 ENCOUNTER — Other Ambulatory Visit: Payer: Self-pay | Admitting: Internal Medicine

## 2018-02-01 DIAGNOSIS — K2930 Chronic superficial gastritis without bleeding: Secondary | ICD-10-CM | POA: Diagnosis not present

## 2018-02-02 ENCOUNTER — Other Ambulatory Visit: Payer: Self-pay | Admitting: Internal Medicine

## 2018-02-02 DIAGNOSIS — F329 Major depressive disorder, single episode, unspecified: Secondary | ICD-10-CM

## 2018-02-02 DIAGNOSIS — F32A Depression, unspecified: Secondary | ICD-10-CM

## 2018-02-07 ENCOUNTER — Encounter: Payer: Medicare Other | Admitting: Internal Medicine

## 2018-02-08 ENCOUNTER — Ambulatory Visit: Payer: Medicare Other

## 2018-02-09 ENCOUNTER — Encounter: Payer: Self-pay | Admitting: Internal Medicine

## 2018-02-09 ENCOUNTER — Ambulatory Visit (INDEPENDENT_AMBULATORY_CARE_PROVIDER_SITE_OTHER): Payer: Medicare Other | Admitting: Internal Medicine

## 2018-02-09 VITALS — BP 157/99 | HR 84 | Temp 97.7°F | Ht 67.0 in | Wt 171.0 lb

## 2018-02-09 DIAGNOSIS — R1031 Right lower quadrant pain: Secondary | ICD-10-CM | POA: Diagnosis not present

## 2018-02-09 DIAGNOSIS — R109 Unspecified abdominal pain: Secondary | ICD-10-CM

## 2018-02-09 DIAGNOSIS — E1165 Type 2 diabetes mellitus with hyperglycemia: Principal | ICD-10-CM

## 2018-02-09 DIAGNOSIS — Z79899 Other long term (current) drug therapy: Secondary | ICD-10-CM

## 2018-02-09 DIAGNOSIS — Z794 Long term (current) use of insulin: Secondary | ICD-10-CM | POA: Diagnosis not present

## 2018-02-09 DIAGNOSIS — E1149 Type 2 diabetes mellitus with other diabetic neurological complication: Secondary | ICD-10-CM

## 2018-02-09 DIAGNOSIS — Z87440 Personal history of urinary (tract) infections: Secondary | ICD-10-CM

## 2018-02-09 DIAGNOSIS — R1032 Left lower quadrant pain: Secondary | ICD-10-CM | POA: Diagnosis not present

## 2018-02-09 DIAGNOSIS — IMO0002 Reserved for concepts with insufficient information to code with codable children: Secondary | ICD-10-CM

## 2018-02-09 LAB — GLUCOSE, CAPILLARY: Glucose-Capillary: 91 mg/dL (ref 65–99)

## 2018-02-09 LAB — POCT GLYCOSYLATED HEMOGLOBIN (HGB A1C): Hemoglobin A1C: 5.6

## 2018-02-09 NOTE — Patient Instructions (Addendum)
Ms. Trzcinski it was nice seeing you today.  -Continue taking your diabetes medications as before.  No changes have been made at this visit.  -Please take metoclopramide 3 times a day with meals as instructed.  If you still continue to have abdominal pain or nausea, please call and make a follow-up appointment with gastroenterology.  FOLLOW-UP INSTRUCTIONS When: 4 weeks For: high blood pressure follow-up What to bring: medications

## 2018-02-09 NOTE — Assessment & Plan Note (Addendum)
Lab Results  Component Value Date   HGBA1C 5.6 02/09/2018   HGBA1C 9.6 09/06/2017   HGBA1C 10.0 04/26/2017     Assessment: Diabetes control:  Well-controlled Progress toward A1C goal:   Improved Comments: Patient is currently taking Lantus 40 units at bedtime, Janumet 50-1000 mg daily, Humalog 10 units late afternoon and 10 units in the evening, Trulicity 1.5 mg weekly, and metformin 1000 mg twice daily.  She was seen by endocrinology in September 2018.  Blood glucose meter showing average 127.  56% of her readings are within target.  She is not complaining of any hypoglycemic symptoms. I have noted patient to be drinking a sweetened carbonated beverages at each visit.   Plan: Medications:  continue current medications Educational resources provided:   Educated patient about healthy eating and exercise. Emphasized the importance of weight loss.  Advised her to stop drinking sweetened beverages. Other plans: Repeat A1c in 3 months.

## 2018-02-09 NOTE — Progress Notes (Signed)
   CC: Follow-up of abdominal pain  HPI:  Susan Holmes is a 62 y.o. female with a past medical history of conditions listed below presenting to the clinic for a follow-up of abdominal pain.  Diabetes was also discussed during this visit. Please see problem based charting for the status of the patient's current and chronic medical conditions.   Past Medical History:  Diagnosis Date  . Abnormal stress test   . Anxiety   . Arthritis   . Congenital blindness    L eye  . Depression   . Diabetes mellitus, type II ([place])   . Glaucoma   . Hepatitis B infection pt unsure   resolved  . HTN (hypertension)   . Hyperlipidemia   . Stroke ([place])   . Tobacco abuse    .5 PPD smoker   Review of Systems: Pertinent positives mentioned in HPI. Remainder of all ROS negative.   Physical Exam:  Vitals:   02/09/18 1002  BP: (!) 157/99  Pulse: 84  Temp: 97.7 F (36.5 C)  TempSrc: Oral  SpO2: 100%  Weight: 171 lb (77.6 kg)  Height: 5\' 7"  (1.702 m)   Physical Exam  Constitutional: She is oriented to person, place, and time. She appears well-developed and well-nourished. No distress.  Drinking a sweetened carbonated beverage  HENT:  Head: Normocephalic and atraumatic.  Mouth/Throat: Oropharynx is clear and moist.  Eyes: Right eye exhibits no discharge. Left eye exhibits no discharge.  Cardiovascular: Normal rate, regular rhythm and intact distal pulses.  Pulmonary/Chest: Effort normal and breath sounds normal. No respiratory distress. She has no wheezes. She has no rales.  Abdominal: Soft. Bowel sounds are normal. She exhibits no distension. There is no tenderness.  Musculoskeletal: She exhibits no edema.  Neurological: She is alert and oriented to person, place, and time.  Skin: Skin is warm and dry.    Assessment & Plan:   See Encounters Tab for problem based charting.  Patient discussed with Dr. Evette Doffing

## 2018-02-09 NOTE — Assessment & Plan Note (Signed)
Patient continues to complain of abdominal pain at this visit.  Continues to be concerned about weight loss.  States the pain is located in her bilateral lower quadrants and present for the past 3 months.  It is dull in character, 7 out of 10 in intensity, and occurs mostly in the morning.  She also reports feeling nauseous in the morning; no episodes of vomiting.  Denies having any fevers.  She was recently seen by gastroenterology and started on metoclopramide based on her gastric emptying study.  The instructions are to take metoclopramide 3 times a day but patient has only been taking it twice a day.  She is also being followed by urology and was recently treated for a UTI.  Her abdominal pain is less likely related to a UTI as treatment did not cause any improvement in her pain.  Urology did not believe her abdominal pain was related to nephrolithiasis.    Plan -Explained to the patient that she should be taking metoclopramide 3 times a day as instructed for diabetic gastroparesis.  Advised her to follow-up with GI as she may need additional testing for further evaluation of her weight loss.

## 2018-02-10 NOTE — Progress Notes (Signed)
Internal Medicine Clinic Attending  Case discussed with Dr. Rathoreat the time of the visit. We reviewed the resident's history and exam and pertinent patient test results. I agree with the assessment, diagnosis, and plan of care documented in the resident's note.  

## 2018-02-21 ENCOUNTER — Encounter: Payer: Medicare Other | Admitting: Internal Medicine

## 2018-02-23 DIAGNOSIS — K5900 Constipation, unspecified: Secondary | ICD-10-CM | POA: Diagnosis not present

## 2018-02-23 DIAGNOSIS — K30 Functional dyspepsia: Secondary | ICD-10-CM | POA: Diagnosis not present

## 2018-02-23 DIAGNOSIS — R1030 Lower abdominal pain, unspecified: Secondary | ICD-10-CM | POA: Diagnosis not present

## 2018-02-26 ENCOUNTER — Other Ambulatory Visit: Payer: Self-pay | Admitting: Internal Medicine

## 2018-02-26 DIAGNOSIS — E1165 Type 2 diabetes mellitus with hyperglycemia: Principal | ICD-10-CM

## 2018-02-26 DIAGNOSIS — E1140 Type 2 diabetes mellitus with diabetic neuropathy, unspecified: Secondary | ICD-10-CM

## 2018-02-26 DIAGNOSIS — Z794 Long term (current) use of insulin: Principal | ICD-10-CM

## 2018-02-26 DIAGNOSIS — IMO0002 Reserved for concepts with insufficient information to code with codable children: Secondary | ICD-10-CM

## 2018-02-27 DIAGNOSIS — H401113 Primary open-angle glaucoma, right eye, severe stage: Secondary | ICD-10-CM | POA: Diagnosis not present

## 2018-02-27 DIAGNOSIS — Z961 Presence of intraocular lens: Secondary | ICD-10-CM | POA: Diagnosis not present

## 2018-02-28 ENCOUNTER — Encounter: Payer: Medicare Other | Admitting: Internal Medicine

## 2018-03-07 ENCOUNTER — Encounter: Payer: Medicare Other | Admitting: Internal Medicine

## 2018-04-11 NOTE — Progress Notes (Signed)
I tried calling the patient but could not reach her over the phone. Will discuss at her next visit.

## 2018-05-16 ENCOUNTER — Ambulatory Visit: Payer: Medicare Other

## 2018-05-23 ENCOUNTER — Encounter: Payer: Self-pay | Admitting: Internal Medicine

## 2018-05-30 DIAGNOSIS — H401113 Primary open-angle glaucoma, right eye, severe stage: Secondary | ICD-10-CM | POA: Diagnosis not present

## 2018-05-30 DIAGNOSIS — Z961 Presence of intraocular lens: Secondary | ICD-10-CM | POA: Diagnosis not present

## 2018-06-02 ENCOUNTER — Other Ambulatory Visit: Payer: Self-pay | Admitting: Internal Medicine

## 2018-06-02 DIAGNOSIS — F329 Major depressive disorder, single episode, unspecified: Secondary | ICD-10-CM

## 2018-06-02 DIAGNOSIS — F32A Depression, unspecified: Secondary | ICD-10-CM

## 2018-06-05 ENCOUNTER — Other Ambulatory Visit: Payer: Self-pay | Admitting: *Deleted

## 2018-06-05 NOTE — Telephone Encounter (Signed)
Duplicate request

## 2018-06-06 ENCOUNTER — Encounter: Payer: Medicare Other | Admitting: Internal Medicine

## 2018-06-13 ENCOUNTER — Encounter: Payer: Self-pay | Admitting: Internal Medicine

## 2018-06-13 ENCOUNTER — Encounter (INDEPENDENT_AMBULATORY_CARE_PROVIDER_SITE_OTHER): Payer: Self-pay

## 2018-06-13 ENCOUNTER — Ambulatory Visit (INDEPENDENT_AMBULATORY_CARE_PROVIDER_SITE_OTHER): Payer: Medicare Other | Admitting: Internal Medicine

## 2018-06-13 ENCOUNTER — Other Ambulatory Visit: Payer: Self-pay

## 2018-06-13 VITALS — BP 162/91 | HR 73 | Temp 97.9°F | Ht 67.0 in | Wt 159.4 lb

## 2018-06-13 DIAGNOSIS — F17210 Nicotine dependence, cigarettes, uncomplicated: Secondary | ICD-10-CM | POA: Diagnosis not present

## 2018-06-13 DIAGNOSIS — R634 Abnormal weight loss: Secondary | ICD-10-CM | POA: Diagnosis not present

## 2018-06-13 DIAGNOSIS — Z6824 Body mass index (BMI) 24.0-24.9, adult: Secondary | ICD-10-CM | POA: Diagnosis not present

## 2018-06-13 DIAGNOSIS — E1140 Type 2 diabetes mellitus with diabetic neuropathy, unspecified: Secondary | ICD-10-CM

## 2018-06-13 DIAGNOSIS — Z86010 Personal history of colonic polyps: Secondary | ICD-10-CM

## 2018-06-13 DIAGNOSIS — Z79899 Other long term (current) drug therapy: Secondary | ICD-10-CM

## 2018-06-13 DIAGNOSIS — E213 Hyperparathyroidism, unspecified: Secondary | ICD-10-CM | POA: Diagnosis not present

## 2018-06-13 DIAGNOSIS — Z794 Long term (current) use of insulin: Secondary | ICD-10-CM | POA: Diagnosis not present

## 2018-06-13 DIAGNOSIS — Z23 Encounter for immunization: Secondary | ICD-10-CM | POA: Diagnosis not present

## 2018-06-13 DIAGNOSIS — E119 Type 2 diabetes mellitus without complications: Secondary | ICD-10-CM

## 2018-06-13 DIAGNOSIS — I10 Essential (primary) hypertension: Secondary | ICD-10-CM

## 2018-06-13 DIAGNOSIS — IMO0002 Reserved for concepts with insufficient information to code with codable children: Secondary | ICD-10-CM

## 2018-06-13 DIAGNOSIS — E559 Vitamin D deficiency, unspecified: Secondary | ICD-10-CM | POA: Diagnosis not present

## 2018-06-13 DIAGNOSIS — Z8639 Personal history of other endocrine, nutritional and metabolic disease: Secondary | ICD-10-CM | POA: Diagnosis not present

## 2018-06-13 DIAGNOSIS — Z0000 Encounter for general adult medical examination without abnormal findings: Secondary | ICD-10-CM

## 2018-06-13 DIAGNOSIS — E1165 Type 2 diabetes mellitus with hyperglycemia: Secondary | ICD-10-CM

## 2018-06-13 LAB — GLUCOSE, CAPILLARY
Glucose-Capillary: 69 mg/dL (ref 65–99)
Glucose-Capillary: 70 mg/dL (ref 65–99)

## 2018-06-13 LAB — POCT GLYCOSYLATED HEMOGLOBIN (HGB A1C): Hemoglobin A1C: 5.8 % — AB (ref 4.0–5.6)

## 2018-06-13 MED ORDER — INSULIN GLARGINE 100 UNIT/ML SOLOSTAR PEN: 36.0000 [IU] | PEN_INJECTOR | Freq: Every day | SUBCUTANEOUS | 1 refills | Status: DC

## 2018-06-13 NOTE — Patient Instructions (Signed)
Susan Holmes it was nice seeing you today.  -Stop using Humalog  -Dose of Lantus has been reduced.  Inject Lantus 36 units into skin at bedtime.  -Continue using metformin as instructed  -Continue using Trulicity as instructed  -Your blood pressure was high today.  Please return to the clinic within 1 week with all of your medications so that dose adjustments can be made.  -Mammogram and chest x-ray have also been ordered.  Our clinic will get the studies scheduled for you as soon as possible.

## 2018-06-14 DIAGNOSIS — R634 Abnormal weight loss: Secondary | ICD-10-CM | POA: Insufficient documentation

## 2018-06-14 LAB — VITAMIN D 25 HYDROXY (VIT D DEFICIENCY, FRACTURES): Vit D, 25-Hydroxy: 30.2 ng/mL (ref 30.0–100.0)

## 2018-06-14 LAB — CMP14 + ANION GAP
ALT: 18 IU/L (ref 0–32)
AST: 20 IU/L (ref 0–40)
Albumin/Globulin Ratio: 1.6 (ref 1.2–2.2)
Albumin: 4.2 g/dL (ref 3.6–4.8)
Alkaline Phosphatase: 103 IU/L (ref 39–117)
Anion Gap: 12 mmol/L (ref 10.0–18.0)
BUN/Creatinine Ratio: 8 — ABNORMAL LOW (ref 12–28)
BUN: 9 mg/dL (ref 8–27)
Bilirubin Total: 0.3 mg/dL (ref 0.0–1.2)
CO2: 22 mmol/L (ref 20–29)
Calcium: 10 mg/dL (ref 8.7–10.3)
Chloride: 108 mmol/L — ABNORMAL HIGH (ref 96–106)
Creatinine, Ser: 1.14 mg/dL — ABNORMAL HIGH (ref 0.57–1.00)
GFR calc Af Amer: 59 mL/min/{1.73_m2} — ABNORMAL LOW (ref >59)
GFR calc non Af Amer: 51 mL/min/{1.73_m2} — ABNORMAL LOW (ref >59)
Globulin, Total: 2.7 g/dL (ref 1.5–4.5)
Glucose: 77 mg/dL (ref 65–99)
Potassium: 4 mmol/L (ref 3.5–5.2)
Sodium: 142 mmol/L (ref 134–144)
Total Protein: 6.9 g/dL (ref 6.0–8.5)

## 2018-06-14 LAB — CBC WITH DIFFERENTIAL/PLATELET
Basophils Absolute: 0 10*3/uL (ref 0.0–0.2)
Basos: 0 %
EOS (ABSOLUTE): 0.3 10*3/uL (ref 0.0–0.4)
Eos: 4 %
Hematocrit: 39.7 % (ref 34.0–46.6)
Hemoglobin: 12.5 g/dL (ref 11.1–15.9)
Immature Grans (Abs): 0 10*3/uL (ref 0.0–0.1)
Immature Granulocytes: 0 %
Lymphocytes Absolute: 3.9 10*3/uL — ABNORMAL HIGH (ref 0.7–3.1)
Lymphs: 50 %
MCH: 22.1 pg — ABNORMAL LOW (ref 26.6–33.0)
MCHC: 31.5 g/dL (ref 31.5–35.7)
MCV: 70 fL — ABNORMAL LOW (ref 79–97)
Monocytes Absolute: 0.5 10*3/uL (ref 0.1–0.9)
Monocytes: 6 %
Neutrophils Absolute: 3.1 10*3/uL (ref 1.4–7.0)
Neutrophils: 40 %
Platelets: 324 10*3/uL (ref 150–450)
RBC: 5.66 x10E6/uL — ABNORMAL HIGH (ref 3.77–5.28)
RDW: 16.9 % — ABNORMAL HIGH (ref 12.3–15.4)
WBC: 7.8 10*3/uL (ref 3.4–10.8)

## 2018-06-14 LAB — HEPATITIS C ANTIBODY: Hep C Virus Ab: <0.1 s/co ratio (ref 0.0–0.9)

## 2018-06-14 LAB — TSH: TSH: 0.509 u[IU]/mL (ref 0.450–4.500)

## 2018-06-14 LAB — PTH, INTACT AND CALCIUM
Calcium: 10 mg/dL (ref 8.7–10.3)
PTH: 57 pg/mL (ref 15–65)

## 2018-06-14 LAB — CALCIUM, IONIZED: Calcium, Ionized, Serum: 5.4 mg/dL (ref 4.5–5.6)

## 2018-06-14 LAB — HIV ANTIBODY (ROUTINE TESTING W REFLEX): HIV Screen 4th Generation wRfx: NONREACTIVE

## 2018-06-14 NOTE — Assessment & Plan Note (Addendum)
Patient is concerned about unintentional weight loss over the past year.  She denies having any abdominal pain, nausea, vomiting, melena, or hematochezia.  States her appetite is unchanged.  Denies having any fevers, night sweats, or noticing any lumps.  Per chart review, she has lost 20+ pounds over the past 1 year.  Colonoscopy done in April 2018 showing colonic polyps (adenomas).  Repeat colonoscopy recommended in 5 years.  She continues to smoke 1/2 pack of cigarettes daily and has been smoking for the past 20 years.  I discussed smoking cessation again at this visit but patient is not ready to quit at this time.  CBC showing chronic microcytosis with normal hemoglobin (12.5) which could possibly suggest thalassemia trait.  LFTs normal.  TSH normal.  HIV antibody nonreactive.  Hep C antibody negative.  Plan -Mammogram pending -Chest x-ray pending

## 2018-06-14 NOTE — Progress Notes (Signed)
Internal Medicine Clinic Attending  Case discussed with Dr. Rathoreat the time of the visit. We reviewed the resident's history and exam and pertinent patient test results. I agree with the assessment, diagnosis, and plan of care documented in the resident's note.  

## 2018-06-14 NOTE — Assessment & Plan Note (Signed)
Tetanus vaccine administered at this visit.

## 2018-06-14 NOTE — Assessment & Plan Note (Addendum)
Calcium and PTH levels checked at this visit normal.  Vitamin D level lower limit of normal.  Plan -Continue over-the-counter vitamin D supplementation (800 - 1000 IU daily)

## 2018-06-14 NOTE — Progress Notes (Signed)
   CC: Unintentional weight loss  HPI:  Susan Holmes is a 62 y.o. female with a past medical history of conditions listed below presenting to the clinic complaining of unintentional weight loss.  Hypertension, diabetes, vitamin D deficiency, and hyperparathyroidism were also discussed. Please see problem based charting for the status of the patient's current and chronic medical conditions.   Past Medical History:  Diagnosis Date  . Abnormal stress test   . Anxiety   . Arthritis   . Congenital blindness    L eye  . Depression   . Diabetes mellitus, type II ([place])   . Glaucoma   . Hepatitis B infection pt unsure   resolved  . HTN (hypertension)   . Hyperlipidemia   . Stroke ([place])   . Tobacco abuse    .5 PPD smoker   Review of Systems: Pertinent positives mentioned in HPI. Remainder of all ROS negative.   Physical Exam:  Vitals:   06/13/18 1444  BP: (!) 162/91  Pulse: 73  Temp: 97.9 F (36.6 C)  TempSrc: Oral  SpO2: 100%  Weight: 159 lb 6.4 oz (72.3 kg)  Height: 5\' 7"  (1.702 m)   Physical Exam  Constitutional: She is oriented to person, place, and time. She appears well-developed and well-nourished. No distress.  HENT:  Mouth/Throat: Oropharynx is clear and moist.  Eyes: Right eye exhibits no discharge. Left eye exhibits no discharge.  Cardiovascular: Normal rate, regular rhythm and intact distal pulses.  Pulmonary/Chest: Effort normal and breath sounds normal. No respiratory distress. She has no wheezes. She has no rales.  Abdominal: Soft. Bowel sounds are normal. She exhibits no distension. There is no tenderness.  Musculoskeletal: She exhibits no edema.  Lymphadenopathy:    She has no cervical adenopathy.  Neurological: She is alert and oriented to person, place, and time.  Skin: Skin is warm and dry.    Assessment & Plan:   See Encounters Tab for problem based charting.  Patient discussed with Dr. Lynnae January

## 2018-06-14 NOTE — Assessment & Plan Note (Signed)
Lab Results  Component Value Date   HGBA1C 5.8 (A) 06/13/2018   HGBA1C 5.6 02/09/2018   HGBA1C 9.6 09/06/2017     Assessment: Comments: There has been a major reduction in her A1c over the past year in the setting of unintentional weight loss.  CBG 69 at this visit.  She is currently taking Humalog 10 units twice daily with meals, metformin, Trulicity, and Lantus 40 units at bedtime.  Dose of insulin needs to be reduced.  She is being followed by [facility] for an annual eye exams.  Plan: Medications: Stop Humalog.  Decrease dose of Lantus to 36 units at bedtime.  Continue metformin and Trulicity. Home glucose monitoring: Frequency:  2-3 times a day Timing:  Before meals Instruction/counseling given: reminded to get eye exam, reminded to bring blood glucose meter & log to each visit, reminded to bring medications to each visit, discussed foot care, discussed diet and discussed sick day management Other plans: Return to the clinic in 4 weeks.

## 2018-06-14 NOTE — Assessment & Plan Note (Signed)
Vitamin D level lower limit of normal.  Plan -Continue over-the-counter vitamin D supplementation (800 - 1000 IU daily)

## 2018-06-14 NOTE — Assessment & Plan Note (Addendum)
BP Readings from Last 3 Encounters:  06/13/18 (!) 162/91  02/09/18 (!) 157/99  01/11/18 (!) 143/79    Lab Results  Component Value Date   NA 142 06/13/2018   K 4.0 06/13/2018   CREATININE 1.14 (H) 06/13/2018    Assessment: Blood pressure control:  Poorly controlled Comments: Current medication regimen includes amlodipine 5 mg daily, losartan 100 mg daily, and carvedilol 12.5 mg twice daily.  Patient did not bring her medications with her to this visit and was not sure about which medications she takes at home.  Per chart review, amlodipine was last filled in September 2018 for 1-month supply.  As such, it seems patient has been nonadherent to her medication regimen.  Labs showing slightly worsening renal function with creatinine 1.1 and GFR 59.  Potassium normal at 4.0.  She will need optimization of her blood pressure.  Plan: Medications:  continue current medications at this time.  Advised patient to return to the clinic within 1 week with all of her medication bottles so that medication reconciliation can be done.  I will also request Dr. Julianne Rice help. Educational resources provided:   Educated patient about healthy eating and exercise.

## 2018-06-20 ENCOUNTER — Encounter: Payer: Self-pay | Admitting: Internal Medicine

## 2018-06-20 ENCOUNTER — Ambulatory Visit (INDEPENDENT_AMBULATORY_CARE_PROVIDER_SITE_OTHER): Payer: Medicare Other | Admitting: Internal Medicine

## 2018-06-20 ENCOUNTER — Other Ambulatory Visit: Payer: Self-pay

## 2018-06-20 ENCOUNTER — Ambulatory Visit: Payer: Medicare Other | Admitting: Pharmacist

## 2018-06-20 VITALS — BP 172/108 | HR 65 | Temp 98.1°F | Ht 67.0 in | Wt 159.2 lb

## 2018-06-20 DIAGNOSIS — J45909 Unspecified asthma, uncomplicated: Secondary | ICD-10-CM | POA: Insufficient documentation

## 2018-06-20 DIAGNOSIS — Z7951 Long term (current) use of inhaled steroids: Secondary | ICD-10-CM

## 2018-06-20 DIAGNOSIS — I10 Essential (primary) hypertension: Secondary | ICD-10-CM | POA: Diagnosis not present

## 2018-06-20 DIAGNOSIS — E119 Type 2 diabetes mellitus without complications: Secondary | ICD-10-CM

## 2018-06-20 DIAGNOSIS — Z7984 Long term (current) use of oral hypoglycemic drugs: Secondary | ICD-10-CM

## 2018-06-20 DIAGNOSIS — Z79899 Other long term (current) drug therapy: Secondary | ICD-10-CM

## 2018-06-20 MED ORDER — BUDESONIDE-FORMOTEROL FUMARATE 160-4.5 MCG/ACT IN AERO: 2.0000 | INHALATION_SPRAY | Freq: Two times a day (BID) | RESPIRATORY_TRACT | 6 refills | Status: DC

## 2018-06-20 MED ORDER — DULAGLUTIDE 0.75 MG/0.5ML ~~LOC~~ SOAJ: SUBCUTANEOUS | 5 refills | Status: DC

## 2018-06-20 MED ORDER — AMLODIPINE BESYLATE 5 MG PO TABS
ORAL_TABLET | ORAL | 3 refills | Status: DC
Start: 2018-06-20 — End: 2018-09-19

## 2018-06-20 MED ORDER — CARVEDILOL 12.5 MG PO TABS: ORAL_TABLET | ORAL | 6 refills | Status: DC

## 2018-06-20 NOTE — Assessment & Plan Note (Signed)
BP Readings from Last 3 Encounters:  06/20/18 (!) 172/108  06/13/18 (!) 162/91  02/09/18 (!) 157/99    Lab Results  Component Value Date   NA 142 06/13/2018   K 4.0 06/13/2018   CREATININE 1.14 (H) 06/13/2018    Assessment: Blood pressure control:  Uncontrolled Comments: Continues to be hypertensive at this visit.  Review of medication bottles reveals patient has not been taking amlodipine and carvedilol.  The only blood pressure medication she is taking at present is losartan 100 mg daily.  Plan: Medications: Advised patient to start taking amlodipine 5 mg daily and carvedilol 12.5 mg twice daily again.  Medications have been refilled.  Continue losartan 100 mg daily. Educational resources provided:   Educated patient about healthy eating and exercise. Other plans: Return for a follow-up in 2 to 3 weeks.

## 2018-06-20 NOTE — Assessment & Plan Note (Addendum)
Trulicity refilled per patient request.

## 2018-06-20 NOTE — Patient Instructions (Signed)
Susan Holmes it was nice seeing you today.  -Continue taking Amlodipine, Carvedilol, and Losartan as instructed.   -Return for a follow-up in 2-3 weeks.

## 2018-06-20 NOTE — Progress Notes (Signed)
   CC: Hypertension follow-up  HPI:  [name] is a 62 y.o. female with a past medical history of conditions listed below presenting to the clinic for a follow-up of hypertension. Please see problem based charting for the status of the patient's current and chronic medical conditions.   Past Medical History:  Diagnosis Date  . Abnormal stress test   . Anxiety   . Arthritis   . Congenital blindness    L eye  . Depression   . Diabetes mellitus, type II ([place])   . Glaucoma   . Hepatitis B infection pt unsure   resolved  . HTN (hypertension)   . Hyperlipidemia   . Stroke ([place])   . Tobacco abuse    .5 PPD smoker   Review of Systems: Pertinent positives mentioned in HPI. Remainder of all ROS negative.   Physical Exam:  Vitals:   06/20/18 1438 06/20/18 1508  BP: (!) 180/102 (!) 172/108  Pulse: 66 65  Temp: 98.1 F (36.7 C)   TempSrc: Oral   SpO2: 100%   Weight: 159 lb 3.2 oz (72.2 kg)   Height: 5\' 7"  (1.702 m)    Physical Exam  Constitutional: She is oriented to person, place, and time. She appears well-developed and well-nourished. No distress.  Eyes: Right eye exhibits no discharge. Left eye exhibits no discharge.  Cardiovascular: Normal rate, regular rhythm and intact distal pulses.  Pulmonary/Chest: Effort normal and breath sounds normal. No respiratory distress. She has no wheezes. She has no rales.  Abdominal: Soft. Bowel sounds are normal. She exhibits no distension. There is no tenderness.  Musculoskeletal: She exhibits no edema.  Neurological: She is alert and oriented to person, place, and time.  Skin: Skin is warm and dry.    Assessment & Plan:   See Encounters Tab for problem based charting.  Patient discussed with Dr. Angelia Mould

## 2018-06-20 NOTE — Assessment & Plan Note (Signed)
Chronic and stable.  Symbicort refilled at this visit.

## 2018-06-21 ENCOUNTER — Encounter: Payer: Self-pay | Admitting: *Deleted

## 2018-06-21 NOTE — Progress Notes (Signed)
Internal Medicine Clinic Attending  Case discussed with Dr. Rathoreat the time of the visit. We reviewed the resident's history and exam and pertinent patient test results. I agree with the assessment, diagnosis, and plan of care documented in the resident's note.  

## 2018-06-26 ENCOUNTER — Encounter: Payer: Self-pay | Admitting: Dietician

## 2018-06-26 ENCOUNTER — Telehealth: Payer: Self-pay | Admitting: Dietician

## 2018-06-26 NOTE — Telephone Encounter (Signed)
Asked to call patient about her blood sugars. She says "I feel alright. Blood sugars 110, a bit weak sometimes from not eating, checks blood sugars two times a day".   Assessed Susan Holmes's food insecurity needs over the phone and reminded and encouraged her to use the local mobile food markets and food banks for additional food. She says she knows about the green and blue books with resources. She agreed to have me assist her with applying to [employer] for a monthly food box and mail her other resources for emergency food. Her food stamps are $24.00/month.  Will schedule an appointment with dietitian for low food security follow up

## 2018-06-27 ENCOUNTER — Other Ambulatory Visit: Payer: Self-pay | Admitting: Internal Medicine

## 2018-07-06 ENCOUNTER — Encounter: Payer: Self-pay | Admitting: Internal Medicine

## 2018-07-06 ENCOUNTER — Ambulatory Visit (INDEPENDENT_AMBULATORY_CARE_PROVIDER_SITE_OTHER): Payer: Medicare Other | Admitting: Internal Medicine

## 2018-07-06 ENCOUNTER — Other Ambulatory Visit: Payer: Self-pay

## 2018-07-06 ENCOUNTER — Other Ambulatory Visit: Payer: Self-pay | Admitting: Internal Medicine

## 2018-07-06 ENCOUNTER — Telehealth: Payer: Self-pay | Admitting: *Deleted

## 2018-07-06 VITALS — BP 181/83 | HR 62 | Temp 97.8°F | Ht 67.0 in | Wt 158.9 lb

## 2018-07-06 DIAGNOSIS — R10814 Left lower quadrant abdominal tenderness: Secondary | ICD-10-CM

## 2018-07-06 DIAGNOSIS — I10 Essential (primary) hypertension: Secondary | ICD-10-CM | POA: Diagnosis not present

## 2018-07-06 DIAGNOSIS — Z6824 Body mass index (BMI) 24.0-24.9, adult: Secondary | ICD-10-CM | POA: Diagnosis not present

## 2018-07-06 DIAGNOSIS — R634 Abnormal weight loss: Secondary | ICD-10-CM

## 2018-07-06 DIAGNOSIS — F17210 Nicotine dependence, cigarettes, uncomplicated: Secondary | ICD-10-CM

## 2018-07-06 DIAGNOSIS — Z86010 Personal history of colonic polyps: Secondary | ICD-10-CM

## 2018-07-06 DIAGNOSIS — Z1231 Encounter for screening mammogram for malignant neoplasm of breast: Secondary | ICD-10-CM

## 2018-07-06 DIAGNOSIS — Z79899 Other long term (current) drug therapy: Secondary | ICD-10-CM

## 2018-07-06 DIAGNOSIS — F17220 Nicotine dependence, chewing tobacco, uncomplicated: Secondary | ICD-10-CM

## 2018-07-06 DIAGNOSIS — E119 Type 2 diabetes mellitus without complications: Secondary | ICD-10-CM

## 2018-07-06 DIAGNOSIS — R197 Diarrhea, unspecified: Secondary | ICD-10-CM | POA: Diagnosis not present

## 2018-07-06 DIAGNOSIS — Z8249 Family history of ischemic heart disease and other diseases of the circulatory system: Secondary | ICD-10-CM

## 2018-07-06 DIAGNOSIS — Z7984 Long term (current) use of oral hypoglycemic drugs: Secondary | ICD-10-CM

## 2018-07-06 MED ORDER — LOPERAMIDE HCL 1 MG/7.5ML PO LIQD: 4.0000 mg | Freq: Three times a day (TID) | ORAL | 0 refills | Status: DC | PRN

## 2018-07-06 NOTE — Assessment & Plan Note (Addendum)
One month of diarrhea, no blood, watery only going about 3 times per day, drinking water okay, no nausea or vomiting.  No recent abx.  Had similar symptoms about a year ago but not sure duration. No changes in diet, no well water, no raw foods.  No sick contacts.  No fever or chills.  Cramping pain with diarrhea, feels some relief afterwards.  Stress has increased recently but does not want to talk about why.  She has noticed weight loss over the last 6 months about 10lbs, has no appetite since then.  Used to have constipation before and took stool softeners but has not taken in the last month.  Followed by Dr. Paulita Fujita 6 months ago was her last visit.  Last colonoscopy 2018 some polyps removed no inflammatory change seen.     -As this does not seem to be infectious we will treat with loperamide 4mg  TID PRN -d/c metformin in case this is contributing although no change in dosage for many years now -encouraged pt to reestablish with Dr. Paulita Fujita at [place]

## 2018-07-06 NOTE — Progress Notes (Signed)
Internal Medicine Clinic Attending  Case discussed with Dr. Winfrey  at the time of the visit.  We reviewed the resident's history and exam and pertinent patient test results.  I agree with the assessment, diagnosis, and plan of care documented in the resident's note.  

## 2018-07-06 NOTE — Assessment & Plan Note (Signed)
BP Readings from Last 3 Encounters:  07/06/18 (!) 181/83  06/20/18 (!) 172/108  06/13/18 (!) 162/91   Pt reports taking her blood pressure medicine this morning.  She is unsure which medicines she took says she took two pills.  She has carvedilol amlodipine and losartan on her med list but the losartan has a note attached saying pt is not taking.  Her blood pressure is elevated, if she is not taking the losartan this would make sense however I'm not sure.    -asked the patient to please check her medicines and call back and let me know whether she is taking it or not.  Asked the patient to please bring her medicine bottles with her to future visits.

## 2018-07-06 NOTE — Patient Instructions (Addendum)
Ms. Cothern I am sorry you have continued to have diarrhea for the last month.  I do not think this is infectious we can treat you with some imodium.  We will stop your metformin as this may be contributing to your diarrhea.  Please get an appointment with Dr. Paulita Fujita and call about getting your mammogram appointment scheduled.  When you get home please check and see if you are taking the losartan which will be very important in how we manage your blood pressure.

## 2018-07-06 NOTE — Progress Notes (Signed)
CC: diarrhea, weight loss  HPI:  Susan Holmes is a 62 y.o. female with PMH below.  She is here to address her diarrhea of one month duration and weight loss over the last 6 months.  She is hypertensive today and we will address this as well.    Please see A&P for status of the patient's chronic medical conditions  Past Medical History:  Diagnosis Date  . Abnormal stress test   . Anxiety   . Arthritis   . Congenital blindness    L eye  . Depression   . Diabetes mellitus, type II ([place])   . Glaucoma   . Hepatitis B infection pt unsure   resolved  . HTN (hypertension)   . Hyperlipidemia   . Stroke ([place])   . Tobacco abuse    .5 PPD smoker   Review of Systems:  ROS: Pulmonary: pt denies increased work of breathing, shortness of breath,  Cardiac: pt denies palpitations, chest pain,  Abdominal: pt endorses occasional abdominal pain associated with diarrhea but denies nausea, vomiting   Physical Exam:  Vitals:   07/06/18 0858  BP: (!) 181/83  Pulse: 62  Temp: 97.8 F (36.6 C)  TempSrc: Oral  SpO2: 100%  Weight: 158 lb 14.4 oz (72.1 kg)  Height: 5\' 7"  (1.702 m)   Physical Exam  Constitutional: No distress.  Cardiovascular: Normal rate, regular rhythm and normal heart sounds. Exam reveals no gallop and no friction rub.  No murmur heard. Pulmonary/Chest: Effort normal and breath sounds normal. No respiratory distress. She has no wheezes. She has no rales. She exhibits no tenderness.  Abdominal: Soft. She exhibits no distension and no mass. Bowel sounds are increased. There is tenderness (LLQ). There is no rebound and no guarding.  Neurological: She is alert.  Skin: She is not diaphoretic.    Social History   Socioeconomic History  . Marital status: Divorced    Spouse name: Not on file  . Number of children: 1  . Years of education: 61  . Highest education level: Not on file  Occupational History    Employer: UNEMPLOYED  Social Needs  . Financial resource  strain: Not on file  . Food insecurity:    Worry: Not on file    Inability: Not on file  . Transportation needs:    Medical: Not on file    Non-medical: Not on file  Tobacco Use  . Smoking status: Current Every Day Smoker    Packs/day: 0.25    Years: 20.00    Pack years: 5.00    Types: Cigarettes  . Smokeless tobacco: Current User    Types: Chew  Substance and Sexual Activity  . Alcohol use: No    Alcohol/week: 0.0 oz  . Drug use: No  . Sexual activity: Not Currently    Birth control/protection: Surgical  Lifestyle  . Physical activity:    Days per week: Not on file    Minutes per session: Not on file  . Stress: Not on file  Relationships  . Social connections:    Talks on phone: Not on file    Gets together: Not on file    Attends religious service: Not on file    Active member of club or organization: Not on file    Attends meetings of clubs or organizations: Not on file    Relationship status: Not on file  . Intimate partner violence:    Fear of current or ex partner: Not on file  Emotionally abused: Not on file    Physically abused: Not on file    Forced sexual activity: Not on file  Other Topics Concern  . Not on file  Social History Narrative  . Not on file    Family History  Problem Relation Age of Onset  . Heart disease Mother 63  . Hypertension Mother   . CVA Mother   . Heart disease Father 57  . CVA Sister   . CVA Brother   . Aneurysm Brother   . Aneurysm Sister   . CVA Maternal Grandmother   . CVA Maternal Grandfather   . Colon cancer Neg Hx   . Esophageal cancer Neg Hx   . Stomach cancer Neg Hx     Assessment & Plan:   See Encounters Tab for problem based charting.  Patient discussed with Dr. Evette Doffing

## 2018-07-06 NOTE — Assessment & Plan Note (Addendum)
Patient has had unintentional weight loss per her report over the last 6 months due to loss of appetite.  Later in the interview mentioned she is eating her meals just doesn't have an appetite.  Daughter tells me she eats one meal per day but has been doing so for the last 20 years.  Difficult to get a clear history from her and the daughter.  Chart review shows her weight has been cyclical over the last five years.  She had some initial workup one month ago which demonstrated normal thyroid function, liver and renal function, HIV and hep c negative.  She continues to smoke.  She is not uptodate on her mammogram, had a colonoscopy in 2018 that had some benign polyps recommended repeat in 5 years.    -pt voicemail box was full so never got mammo referral from one month ago, she was given the number to call to set up her appointment today -introduced the idea of CT scan of chest given smoking history she adamantly said no that she just had a chest CT.  On chart review last CT chest 2014.   -On patient's follow up appointment please introduce the idea of future imaging to screen for lung and other malignancies if weight loss is persistent.  May need to quantify how much the patient is actually eating pt and daughter were not very helpful in this regard today.

## 2018-07-06 NOTE — Telephone Encounter (Signed)
Pt calls back and states she is taking LOSATAN 100MG  1X DAILY

## 2018-07-07 ENCOUNTER — Telehealth: Payer: Self-pay | Admitting: Internal Medicine

## 2018-07-11 ENCOUNTER — Encounter: Payer: Self-pay | Admitting: Internal Medicine

## 2018-07-11 ENCOUNTER — Other Ambulatory Visit: Payer: Self-pay

## 2018-07-11 ENCOUNTER — Ambulatory Visit (INDEPENDENT_AMBULATORY_CARE_PROVIDER_SITE_OTHER): Payer: Medicare Other | Admitting: Internal Medicine

## 2018-07-11 VITALS — BP 148/88 | HR 61 | Temp 98.8°F | Ht 67.0 in | Wt 157.2 lb

## 2018-07-11 DIAGNOSIS — E785 Hyperlipidemia, unspecified: Secondary | ICD-10-CM

## 2018-07-11 DIAGNOSIS — M1990 Unspecified osteoarthritis, unspecified site: Secondary | ICD-10-CM

## 2018-07-11 DIAGNOSIS — Z79899 Other long term (current) drug therapy: Secondary | ICD-10-CM

## 2018-07-11 DIAGNOSIS — F32A Depression, unspecified: Secondary | ICD-10-CM

## 2018-07-11 DIAGNOSIS — K529 Noninfective gastroenteritis and colitis, unspecified: Secondary | ICD-10-CM

## 2018-07-11 DIAGNOSIS — F339 Major depressive disorder, recurrent, unspecified: Secondary | ICD-10-CM

## 2018-07-11 DIAGNOSIS — F419 Anxiety disorder, unspecified: Secondary | ICD-10-CM

## 2018-07-11 DIAGNOSIS — R197 Diarrhea, unspecified: Secondary | ICD-10-CM

## 2018-07-11 DIAGNOSIS — E119 Type 2 diabetes mellitus without complications: Secondary | ICD-10-CM

## 2018-07-11 DIAGNOSIS — I10 Essential (primary) hypertension: Secondary | ICD-10-CM

## 2018-07-11 DIAGNOSIS — Z86010 Personal history of colonic polyps: Secondary | ICD-10-CM

## 2018-07-11 DIAGNOSIS — Z794 Long term (current) use of insulin: Secondary | ICD-10-CM

## 2018-07-11 DIAGNOSIS — F329 Major depressive disorder, single episode, unspecified: Secondary | ICD-10-CM

## 2018-07-11 MED ORDER — DULAGLUTIDE 0.75 MG/0.5ML ~~LOC~~ SOAJ: SUBCUTANEOUS | 5 refills | Status: DC

## 2018-07-11 MED ORDER — LOSARTAN POTASSIUM 100 MG PO TABS: 100.0000 mg | ORAL_TABLET | Freq: Every day | ORAL | 3 refills | Status: DC

## 2018-07-11 MED ORDER — DULOXETINE HCL 30 MG PO CPEP: ORAL_CAPSULE | ORAL | 0 refills | Status: DC

## 2018-07-11 MED ORDER — INSULIN GLARGINE 100 UNIT/ML SOLOSTAR PEN: 36.0000 [IU] | PEN_INJECTOR | Freq: Every day | SUBCUTANEOUS | 1 refills | Status: DC

## 2018-07-11 MED ORDER — PRAVASTATIN SODIUM 10 MG PO TABS: 5.0000 mg | ORAL_TABLET | Freq: Every day | ORAL | 3 refills | Status: DC

## 2018-07-11 MED ORDER — ACCU-CHEK FASTCLIX LANCETS MISC: 5 refills | Status: DC

## 2018-07-11 MED ORDER — CARVEDILOL 12.5 MG PO TABS: ORAL_TABLET | ORAL | 6 refills | Status: DC

## 2018-07-11 MED ORDER — GLUCOSE BLOOD VI STRP: ORAL_STRIP | 11 refills | Status: DC

## 2018-07-11 NOTE — Patient Instructions (Signed)
Susan Holmes  We are so sorry that you are having trouble with your diarrhea. Please follow up with your GI doctors and we will see you after you have met with him. Please take all of your medications as prescribed. Thank you for visiting the clinic.  -Gilberto Better

## 2018-07-12 ENCOUNTER — Encounter: Payer: Self-pay | Admitting: Internal Medicine

## 2018-07-12 DIAGNOSIS — E119 Type 2 diabetes mellitus without complications: Secondary | ICD-10-CM | POA: Insufficient documentation

## 2018-07-12 NOTE — Progress Notes (Signed)
Internal Medicine Clinic Attending  I saw and evaluated the patient.  I personally confirmed the key portions of the history and exam documented by Dr. Truman Hayward and I reviewed pertinent patient test results.  The assessment, diagnosis, and plan were formulated together and I agree with the documentation in the resident's note.  Here for follow up of chronic recurrent diarrhea and weight loss. Reports multiple years (at least 4 in chart review) of diarrhea that comes and goes. Currently going about 3x/day, not waking her up at night, no blood. Also notes weight loss of >10 pounds this year, close to 40 pounds over past year in our records. As noted, recent BMP, CBC, and TSH normal. No stool studies yet. Started on loperamide by Dr. Shan Levans at last visit, no improvement. With unintentional weight loss and chronic diarrhea, we should test for celiac (although unlikely onset at this age), very unlikely to be IBD, no evidence of malabsorptive state such as chronic pancreatitis, but may benefit from stool studies. Already established with GI, has upcoming appointment soon, will defer further workup to them.   Lenice Pressman, M.D., Ph.D.

## 2018-07-12 NOTE — Assessment & Plan Note (Signed)
-   Patient states she needs refills on her duloxetine. - She is currently on Cymbalta 30mg  daily and trazodone 200mg  - States she has loss of appetite and anxiety mostly due to her diarrhea - Denies suicidal/homicidal ideations - Will refill her Cymbalta

## 2018-07-12 NOTE — Assessment & Plan Note (Signed)
-   Pt complains of off-and-on diarrhea of 3 year duration. Latest episode lasting a month. States she is having diarrhea every day first thing in the morning. - Described as 'sometimes straight water, sometimes like brown whipped cream' - Accompanied with diffuse abdominal pain but no nausea, vomiting. - Denies melena - Prior history of constipation on stool softeners - Diet consists of vegetables, sandwiches, hamburgers - Last colonoscopy 2018 w/ 2 <1cm sessile polyps but no other findings - On loperamide 4mg  TID PRN, but states has not been helping  - No changes from prior assessment on 07/06/18 - Strongly encourage patient to follow up with [facility] (Dr.Outlaw)

## 2018-07-12 NOTE — Assessment & Plan Note (Addendum)
BP Readings from Last 3 Encounters:  07/11/18 (!) 148/88  07/06/18 (!) 181/83  06/20/18 (!) 172/108   - Elevated blood pressure above goal this morning. - Currently on carvedilol 12.5mg  BID, Amlodipine 5mg  daily, Losartan 100mg  daily - States she needs refills on the Coreg and Cozaar, but states she has been taking her anti-hypertensive medication as prescribed - Also states her blood pressure has been high (>150/80) whenever she has it checked recently - She attributes the high blood pressure to constant anxiety and stress over her diarrhea  - C/w Coreg 12.5mg  BID, losartan 100mg  daily, amlodipine 5mg  daily - Will need reassess and adjust dosages when pt's GI symptoms have resolved as she states she doesn't want to 'deal with anything other than getting this diarrhea fixed' at this point

## 2018-07-12 NOTE — Assessment & Plan Note (Signed)
-   Patient requesting refills on diabetic supplies, insulin, trulicity and pravastatin - States blood sugar is doing well but did not bring her glucometer with her - Last A1c in 05/2018 5.9 - Pt had visit with PCP month ago where those meds were refilled but will refill again in case there were issues with transmission to pharmacy

## 2018-07-12 NOTE — Progress Notes (Signed)
   CC: Diarrhea and Hypertension management  HPI: [name] is a 62 y.o. F w/ PMH of HTN, Depression,HLD, and arthritis presenting to the clinic with continuing complaints of diarrhea. She was seen at Clinic 5 days ago for the same complaint of diarrhea especially in the morning and was given loperamide for treatment with referral for GI. She has not yet followed up with GI but she came to the clinic as a follow up for a prior visit for HTN. She states she feels like her blood pressure is high because she is so anxious and stressed out about her diarrhea. She states it has been ongoing for couple years now and describes the consistency as like 'whipped cream' She denies any dark, tarry stool. No vomiting. Does complain of diffuse abdominal pain in the mornings when she is having diarrhea. She has an appointment with gastroentereology sometime in August but cannot remember exactly when. She also states she needs refills on her diabetic and hypertension medications.  Past Medical History:  Diagnosis Date  . Abnormal stress test   . Anxiety   . Arthritis   . Congenital blindness    L eye  . Depression   . Diabetes mellitus, type II ([place])   . Glaucoma   . Hepatitis B infection pt unsure   resolved  . HTN (hypertension)   . Hyperlipidemia   . Stroke ([place])   . Tobacco abuse    .5 PPD smoker    Review of Systems: Review of Systems  Constitutional: Negative for chills, fever and weight loss.  Eyes: Negative for blurred vision, double vision and pain.  Cardiovascular: Negative for chest pain, palpitations and leg swelling.  Gastrointestinal: Positive for abdominal pain and diarrhea. Negative for constipation, melena, nausea and vomiting.  Neurological: Negative for dizziness, sensory change, weakness and headaches.     Physical Exam: Vitals:   07/11/18 0949 07/11/18 1027  BP: (!) 163/94 (!) 148/88  Pulse: 61   Temp: 98.8 F (37.1 C)   TempSrc: Oral   SpO2: 100%   Weight:  157 lb 3.2 oz (71.3 kg)   Height: 5\' 7"  (1.702 m)     Physical Exam  Constitutional: She is oriented to person, place, and time. She appears well-developed and well-nourished. No distress.  Cardiovascular: Normal rate, regular rhythm, normal heart sounds and intact distal pulses.  Respiratory: Breath sounds normal. No respiratory distress. She has no wheezes.  GI: Soft. Bowel sounds are normal. She exhibits no distension. There is no tenderness. There is no guarding.  Neurological: She is alert and oriented to person, place, and time. She has normal reflexes. Coordination normal.  Psychiatric: Her behavior is normal. Judgment and thought content normal.  Flat affect     Assessment & Plan:   See Encounters Tab for problem based charting.  Patient seen with Dr. Rebeca Alert   -Gilberto Better, PGY1

## 2018-07-19 DIAGNOSIS — R197 Diarrhea, unspecified: Secondary | ICD-10-CM | POA: Diagnosis not present

## 2018-07-19 DIAGNOSIS — K3189 Other diseases of stomach and duodenum: Secondary | ICD-10-CM | POA: Diagnosis not present

## 2018-07-19 DIAGNOSIS — K30 Functional dyspepsia: Secondary | ICD-10-CM | POA: Diagnosis not present

## 2018-07-19 DIAGNOSIS — R110 Nausea: Secondary | ICD-10-CM | POA: Diagnosis not present

## 2018-07-20 DIAGNOSIS — R197 Diarrhea, unspecified: Secondary | ICD-10-CM | POA: Diagnosis not present

## 2018-08-01 ENCOUNTER — Ambulatory Visit
Admission: RE | Admit: 2018-08-01 | Discharge: 2018-08-01 | Disposition: A | Discharge status: Home / Self Care | Payer: Medicare HMO | Source: Ambulatory Visit | Attending: Physician Assistant | Admitting: Physician Assistant

## 2018-08-01 ENCOUNTER — Other Ambulatory Visit: Payer: Self-pay | Admitting: Physician Assistant

## 2018-08-01 ENCOUNTER — Ambulatory Visit
Admission: RE | Admit: 2018-08-01 | Discharge: 2018-08-01 | Disposition: A | Discharge status: Home / Self Care | Payer: Medicare HMO | Source: Ambulatory Visit | Attending: Nurse Practitioner | Admitting: Nurse Practitioner

## 2018-08-01 DIAGNOSIS — R634 Abnormal weight loss: Secondary | ICD-10-CM

## 2018-08-01 DIAGNOSIS — Z1231 Encounter for screening mammogram for malignant neoplasm of breast: Secondary | ICD-10-CM

## 2018-08-01 DIAGNOSIS — Z720 Tobacco use: Secondary | ICD-10-CM

## 2018-08-01 DIAGNOSIS — R05 Cough: Secondary | ICD-10-CM | POA: Diagnosis not present

## 2018-08-09 ENCOUNTER — Other Ambulatory Visit: Payer: Self-pay | Admitting: Physician Assistant

## 2018-08-09 DIAGNOSIS — R634 Abnormal weight loss: Secondary | ICD-10-CM

## 2018-08-09 DIAGNOSIS — R110 Nausea: Secondary | ICD-10-CM | POA: Diagnosis not present

## 2018-08-09 DIAGNOSIS — R9389 Abnormal findings on diagnostic imaging of other specified body structures: Secondary | ICD-10-CM | POA: Diagnosis not present

## 2018-08-09 DIAGNOSIS — R197 Diarrhea, unspecified: Secondary | ICD-10-CM | POA: Diagnosis not present

## 2018-08-09 DIAGNOSIS — R1030 Lower abdominal pain, unspecified: Secondary | ICD-10-CM | POA: Diagnosis not present

## 2018-08-09 DIAGNOSIS — K3189 Other diseases of stomach and duodenum: Secondary | ICD-10-CM | POA: Diagnosis not present

## 2018-08-09 DIAGNOSIS — Z86010 Personal history of colonic polyps: Secondary | ICD-10-CM | POA: Diagnosis not present

## 2018-08-10 ENCOUNTER — Ambulatory Visit
Admission: RE | Admit: 2018-08-10 | Discharge: 2018-08-10 | Disposition: A | Discharge status: Home / Self Care | Payer: Medicare HMO | Source: Ambulatory Visit | Attending: Physician Assistant | Admitting: Physician Assistant

## 2018-08-10 DIAGNOSIS — N200 Calculus of kidney: Secondary | ICD-10-CM | POA: Diagnosis not present

## 2018-08-10 DIAGNOSIS — R1030 Lower abdominal pain, unspecified: Secondary | ICD-10-CM

## 2018-08-10 DIAGNOSIS — R634 Abnormal weight loss: Secondary | ICD-10-CM

## 2018-08-22 DIAGNOSIS — R8271 Bacteriuria: Secondary | ICD-10-CM | POA: Diagnosis not present

## 2018-08-22 DIAGNOSIS — N200 Calculus of kidney: Secondary | ICD-10-CM | POA: Diagnosis not present

## 2018-08-22 DIAGNOSIS — D49511 Neoplasm of unspecified behavior of right kidney: Secondary | ICD-10-CM | POA: Diagnosis not present

## 2018-08-30 DIAGNOSIS — D49511 Neoplasm of unspecified behavior of right kidney: Secondary | ICD-10-CM | POA: Diagnosis not present

## 2018-08-30 DIAGNOSIS — C641 Malignant neoplasm of right kidney, except renal pelvis: Secondary | ICD-10-CM | POA: Diagnosis not present

## 2018-08-31 DIAGNOSIS — N200 Calculus of kidney: Secondary | ICD-10-CM | POA: Diagnosis not present

## 2018-08-31 DIAGNOSIS — D49511 Neoplasm of unspecified behavior of right kidney: Secondary | ICD-10-CM | POA: Diagnosis not present

## 2018-08-31 DIAGNOSIS — R911 Solitary pulmonary nodule: Secondary | ICD-10-CM | POA: Diagnosis not present

## 2018-09-06 ENCOUNTER — Other Ambulatory Visit: Payer: Self-pay | Admitting: Urology

## 2018-09-06 DIAGNOSIS — N200 Calculus of kidney: Secondary | ICD-10-CM

## 2018-09-13 ENCOUNTER — Telehealth: Payer: Self-pay | Admitting: Dietician

## 2018-09-13 DIAGNOSIS — E119 Type 2 diabetes mellitus without complications: Secondary | ICD-10-CM

## 2018-09-13 NOTE — Telephone Encounter (Signed)
Patient agrees to an appointment for Continuous glucose monitoring. She is on insulin and A1C 5.8% and has ongoing weight loss. Please sign order if you agree. Thank you!

## 2018-09-19 ENCOUNTER — Encounter: Payer: Self-pay | Admitting: Dietician

## 2018-09-19 ENCOUNTER — Ambulatory Visit (INDEPENDENT_AMBULATORY_CARE_PROVIDER_SITE_OTHER): Payer: Medicare HMO | Admitting: Internal Medicine

## 2018-09-19 ENCOUNTER — Other Ambulatory Visit: Payer: Self-pay

## 2018-09-19 ENCOUNTER — Ambulatory Visit (INDEPENDENT_AMBULATORY_CARE_PROVIDER_SITE_OTHER): Payer: Medicare HMO | Admitting: Dietician

## 2018-09-19 VITALS — Wt 159.8 lb

## 2018-09-19 VITALS — BP 157/82 | HR 57 | Temp 97.7°F | Wt 159.8 lb

## 2018-09-19 DIAGNOSIS — F17210 Nicotine dependence, cigarettes, uncomplicated: Secondary | ICD-10-CM

## 2018-09-19 DIAGNOSIS — Z713 Dietary counseling and surveillance: Secondary | ICD-10-CM

## 2018-09-19 DIAGNOSIS — L84 Corns and callosities: Secondary | ICD-10-CM | POA: Insufficient documentation

## 2018-09-19 DIAGNOSIS — R6889 Other general symptoms and signs: Secondary | ICD-10-CM | POA: Diagnosis not present

## 2018-09-19 DIAGNOSIS — M546 Pain in thoracic spine: Secondary | ICD-10-CM | POA: Diagnosis not present

## 2018-09-19 DIAGNOSIS — I10 Essential (primary) hypertension: Secondary | ICD-10-CM

## 2018-09-19 DIAGNOSIS — E119 Type 2 diabetes mellitus without complications: Secondary | ICD-10-CM

## 2018-09-19 DIAGNOSIS — M549 Dorsalgia, unspecified: Secondary | ICD-10-CM

## 2018-09-19 DIAGNOSIS — Z79899 Other long term (current) drug therapy: Secondary | ICD-10-CM

## 2018-09-19 DIAGNOSIS — Z6825 Body mass index (BMI) 25.0-25.9, adult: Secondary | ICD-10-CM

## 2018-09-19 HISTORY — DX: Corns and callosities: L84

## 2018-09-19 LAB — GLUCOSE, CAPILLARY: Glucose-Capillary: 149 mg/dL — ABNORMAL HIGH (ref 70–99)

## 2018-09-19 LAB — POCT GLYCOSYLATED HEMOGLOBIN (HGB A1C): Hemoglobin A1C: 6.2 % — AB (ref 4.0–5.6)

## 2018-09-19 MED ORDER — HYDROCORTISONE 1 % EX CREA: TOPICAL_CREAM | CUTANEOUS | 1 refills | Status: DC

## 2018-09-19 MED ORDER — AMLODIPINE BESYLATE 10 MG PO TABS: 10.0000 mg | ORAL_TABLET | Freq: Every day | ORAL | 3 refills | Status: DC

## 2018-09-19 NOTE — Assessment & Plan Note (Signed)
Today blood pressure 157/82 with pulse rate 57.  Patient is currently on amlodipine 5 mg daily, Coreg 12.5 mg twice daily, losartan 100 mg daily.  She reports that she is compliant to her medication.  She has had high blood pressure documented on previous visits. -Increase amlodipine to 10 mg daily -Continue Coreg 12.5 mg twice daily and losartan 100 mg daily -Follow-up in clinic in 2 weeks

## 2018-09-19 NOTE — Progress Notes (Signed)
Weight loss:  Estimated body mass index is 25.03 kg/m as calculated from the following:   Height as of 07/11/18: 5\' 7"  (1.702 m).   Weight as of an earlier encounter on 09/19/18: 159 lb 12.8 oz (72.5 kg).  Wt Readings from Last 5 Encounters:  09/19/18 159 lb 12.8 oz (72.5 kg)  07/11/18 157 lb 3.2 oz (71.3 kg)  07/06/18 158 lb 14.4 oz (72.1 kg)  06/20/18 159 lb 3.2 oz (72.2 kg)  06/13/18 159 lb 6.4 oz (72.3 kg)   She is maintaining weight in healthy weight range x 6 months. She reports that she stopped her Trulicity about 1 month ago because it might be causing her stomach problems.  A1C and blood sugar continues well controlled.  METER DOWNLOAD  Report summary is from last 30 days,  Average tests per day: 1-2 Average blood glucose: 148 Range: minimum: 80 and maximum: 264 Days without test: 0 % in target range: 40 % below target range: 0 % above target range: 60 % hypoglycemia: 0 Notes about patterns: almost 100% fasting in target, before bedtime readings vary with ~ 50% in target and 50% above target    Lab Results  Component Value Date   HGBA1C 6.2 (A) 09/19/2018   Documentation for Freestyle Libre Pro Continuous glucose monitoring Freestyle Libre Pro CGM sensor placed and started on Jenina Moening who was identified by name and date of birth.  Patient was educated about wearing sensor, keeping food, activity and medication log and when to call office.She was educated about how to care for the sensor and not to have an MRI, CT or Diathermy while wearing the sensorwhat she can learn from the Continuous glucose monitoring. Follow up was arranged with the patient for 1 week to see a doctor and diabetes educator.  Lot #:[id number] A Serial #:[id number] Expiration Date:03/26/2018 Debera Lat, RD 09/19/2018 8:42 AM.

## 2018-09-19 NOTE — Patient Instructions (Signed)
Call Dr. Katy Fitch and schedule your eye appointment- [phone]  Stop at lab for your A1C.     Please record the time, amount and what food drinks and activities you have while wearing the continuous glucose monitor(CGM).  Bring the folder with you to follow up appointments  Do not have a CT or an MRI while wearing the CGM.   Please make an appointment for 1 week with me and a doctor for the first of two CGM downloads..   You will also return in 2 weeks to have your second download and the CGM remove   Your weight looks good- keep trying to eat balanced and healthy meals.   When your blood sugar is higher after dinner, consider taking a short 5 minutes walk and drinking a glass of water. You do not need a bedtime snack but IF you have one make it low in starch/sugar9 carbs)

## 2018-09-19 NOTE — Patient Instructions (Addendum)
It was our pleasure taking care of you in our clinic today. You were seen for back pain. Your spine exam is normal, I think your pain is due to skin irritation. I prescribe topical Hydrocortisone for you to apply in the area twice a day.  (Your back pain has been going on for a month and you do not have sever rash in the area to be suspicious for shingle at this point, but if it got worse, please come back to clinic to be seen and recheck for shingle). Please avoid tight cloths that contact the area.  You also asked for referral to podiatrist for your foot lesion. It looks to be Callus on exam and I refer you for treatment such as scraping.  Your Blood pressure today was high, it seems that it has been high recently although you have taken your medications regularly. I increase Amlodipine to 10 mg daily.   Please come back to clinic in 2 weeks to recheck your blood pressure or earlier if your back pain does not improve. Please let us know if you have any question or concern.  Thanks Dr. Myrtie Hawk

## 2018-09-19 NOTE — Progress Notes (Signed)
   CC: Back pain  HPI:  Susan Holmes is a 62 y.o. with past medical history as listed below is here in clinic today for back pain and CGM placement. Please see encounter base assessment and plan for further details.  Past Medical History:  Diagnosis Date  . Abnormal stress test   . Anxiety   . Arthritis   . Congenital blindness    L eye  . Depression   . Diabetes mellitus, type II ([place])   . Glaucoma   . Hepatitis B infection pt unsure   resolved  . HTN (hypertension)   . Hyperlipidemia   . Stroke ([place])   . Tobacco abuse    .5 PPD smoker   Family Hx: Heart disease in father HTN in mother  Social Hx: Smokes 8 cigarettes/day No alcohol use No drug use  Review of Systems:  Review of Systems  Constitutional: Negative for fever.  Respiratory: Negative for cough and sputum production.   Skin: Positive for itching. Negative for rash.  Neurological: Negative for sensory change, focal weakness and weakness.    Physical Exam:  Vitals:   09/19/18 0841  BP: (!) 157/82  Pulse: (!) 57  Temp: 97.7 F (36.5 C)  TempSrc: Oral  SpO2: 100%  Weight: 159 lb 12.8 oz (72.5 kg)    Physical Exam  Constitutional: She is oriented to person, place, and time. She appears well-developed and well-nourished.  Eyes: EOM are normal.  Cardiovascular: Normal rate, regular rhythm and normal heart sounds.  No murmur heard. Pulmonary/Chest: Effort normal and breath sounds normal. No respiratory distress. She has no wheezes. She has no rales.  Abdominal: Soft. Bowel sounds are normal. There is no tenderness.  Musculoskeletal: Normal range of motion. She exhibits mild redness and mild local tenderness to palpation at her right paraspinal area around T4.  Callus lesions on plantar surface of right foot. She exhibits no edema.   Neurological: She is alert and oriented to person, place, and time.  Skin: No rash noted.   Assessment & Plan:   See Encounters Tab for problem based  charting.  Patient seen with Dr. Rebeca Alert

## 2018-09-19 NOTE — Assessment & Plan Note (Signed)
CGM placed today.

## 2018-09-22 NOTE — Progress Notes (Signed)
Internal Medicine Clinic Attending  I saw and evaluated the patient.  I personally confirmed the key portions of the history and exam documented by Dr. Myrtie Hawk and I reviewed pertinent patient test results.  The assessment, diagnosis, and plan were formulated together and I agree with the documentation in the resident's note.  Lenice Pressman, M.D., Ph.D.

## 2018-09-22 NOTE — Assessment & Plan Note (Signed)
This is a new problem began 1 month ago.  Reports some dull pain on upper right side of his back.  Does not change with movement but gets worse when push it.  Pain does not radiate to anywhere else.  No fever, no chest pain, no shortness of breath.  Denies any rashes but sometimes the area is itchy.  On exam, there is mild redness and mild tenderness at right side of the spines the level of T4.  It is localized and does not involve the full dermatome.  No rashes or vesicles.  No tenderness on the spine. Seems to be some irritation/contact dermatitis probably due to tight clothes/bras. -Topical hydrocortisone twice daily -Avoid tight clothes -Recommended to come back to clinic in a week if did not improve or got worse

## 2018-09-22 NOTE — Assessment & Plan Note (Signed)
Patient complain of some pain on her right foot when walking. Has callus on plantar aspect of his right foot.  No erythema, tenderness.  No sign of diabetic wound or skin changes. -Ambulatory referral to podiatrist

## 2018-09-26 ENCOUNTER — Ambulatory Visit (INDEPENDENT_AMBULATORY_CARE_PROVIDER_SITE_OTHER): Payer: Medicare HMO | Admitting: Internal Medicine

## 2018-09-26 ENCOUNTER — Ambulatory Visit (INDEPENDENT_AMBULATORY_CARE_PROVIDER_SITE_OTHER): Payer: Medicare HMO | Admitting: Dietician

## 2018-09-26 ENCOUNTER — Telehealth: Payer: Self-pay | Admitting: Dietician

## 2018-09-26 ENCOUNTER — Ambulatory Visit (HOSPITAL_COMMUNITY)
Admission: RE | Admit: 2018-09-26 | Discharge: 2018-09-26 | Disposition: A | Discharge status: Home / Self Care | Payer: Medicare HMO | Source: Ambulatory Visit | Attending: Family Medicine | Admitting: Family Medicine

## 2018-09-26 ENCOUNTER — Encounter: Payer: Self-pay | Admitting: Internal Medicine

## 2018-09-26 ENCOUNTER — Other Ambulatory Visit: Payer: Self-pay

## 2018-09-26 ENCOUNTER — Encounter: Payer: Self-pay | Admitting: Dietician

## 2018-09-26 VITALS — BP 147/92 | HR 59 | Temp 98.4°F | Wt 158.1 lb

## 2018-09-26 DIAGNOSIS — R0789 Other chest pain: Secondary | ICD-10-CM

## 2018-09-26 DIAGNOSIS — E119 Type 2 diabetes mellitus without complications: Secondary | ICD-10-CM

## 2018-09-26 DIAGNOSIS — R001 Bradycardia, unspecified: Secondary | ICD-10-CM | POA: Diagnosis not present

## 2018-09-26 DIAGNOSIS — E1169 Type 2 diabetes mellitus with other specified complication: Secondary | ICD-10-CM

## 2018-09-26 DIAGNOSIS — E785 Hyperlipidemia, unspecified: Secondary | ICD-10-CM | POA: Diagnosis not present

## 2018-09-26 DIAGNOSIS — Z8249 Family history of ischemic heart disease and other diseases of the circulatory system: Secondary | ICD-10-CM | POA: Diagnosis not present

## 2018-09-26 DIAGNOSIS — K219 Gastro-esophageal reflux disease without esophagitis: Secondary | ICD-10-CM | POA: Diagnosis not present

## 2018-09-26 DIAGNOSIS — Z794 Long term (current) use of insulin: Secondary | ICD-10-CM

## 2018-09-26 DIAGNOSIS — R079 Chest pain, unspecified: Secondary | ICD-10-CM

## 2018-09-26 DIAGNOSIS — R9431 Abnormal electrocardiogram [ECG] [EKG]: Secondary | ICD-10-CM | POA: Diagnosis not present

## 2018-09-26 DIAGNOSIS — R6889 Other general symptoms and signs: Secondary | ICD-10-CM | POA: Diagnosis not present

## 2018-09-26 DIAGNOSIS — Z720 Tobacco use: Secondary | ICD-10-CM

## 2018-09-26 DIAGNOSIS — Z79899 Other long term (current) drug therapy: Secondary | ICD-10-CM

## 2018-09-26 DIAGNOSIS — Z713 Dietary counseling and surveillance: Secondary | ICD-10-CM | POA: Diagnosis not present

## 2018-09-26 HISTORY — DX: Chest pain, unspecified: R07.9

## 2018-09-26 LAB — TROPONIN I: Troponin I: <0.03 ng/mL (ref <0.03)

## 2018-09-26 MED ORDER — INSULIN GLARGINE 100 UNIT/ML SOLOSTAR PEN: 32.0000 [IU] | PEN_INJECTOR | Freq: Every day | SUBCUTANEOUS | 1 refills | Status: DC

## 2018-09-26 MED ORDER — NITROGLYCERIN 0.4 MG SL SUBL
0.4000 mg | SUBLINGUAL_TABLET | Freq: Once | SUBLINGUAL | Status: AC
  Administered 2018-09-26: 0.4 mg via SUBLINGUAL

## 2018-09-26 NOTE — Progress Notes (Addendum)
   CC: CGM monitoring and Chest pain/pressure  HPI:  [name] is a 62 y.o. female who presents today for evaluation of her diabetes and chest pressure.   Chest pressure: The patient endorsed a 1.81-month history of random onset left-sided chest pressure that is associated with shortness of breath and diaphoresis as well as radiating into her left shoulder with the feeling of heaviness.  She states that there is no known precursor, trigger, or prodrome that she is aware of and it is not made worse with activity, although she is not particularly active. It is self resolving after approximately 7 to 8 minutes and she has not tried additional treatment. The patient states that it is somewhat similar in nature to her prior GERD symptoms but not completely consistent. She has risk factors of family history, smoking, and diabetes.   Plan: EKG-reassuring, no notable ischemic changes Troponin (given current pressure), <0.03 NTG sublingual with only possible mild relief  Consider stress testing Heart score or 3-4 based on negative trop and reassuring EKG with HPI and risk factors. She was presented with the option to be admitted for observation which she declined. She would prefer outpatient evaluation by cardiology, referral has been placed.   Addendum: Stress test performed. Determined to be low risk. Patient not willing to quit smoking. She had been notified of these results. Continue risk factor modification.   Diabetes: Susan Holmes wore the CGM for 5 days. The average reading was 117, % time in target was 87, % time below target was 6, and % time above target was. 7. Intervention will be to decrease her Lantus to 32U nightly. The patient will be scheduled to be seen in [facility] for a final appointment.   Past Medical History:  Diagnosis Date  . Abnormal stress test   . Anxiety   . Arthritis   . Congenital blindness    L eye  . Depression   . Diabetes mellitus, type II ([place])   .  Glaucoma   . Hepatitis B infection pt unsure   resolved  . HTN (hypertension)   . Hyperlipidemia   . Stroke ([place])   . Tobacco abuse    .5 PPD smoker   Review of Systems:  ROS negative except as per A/P.  Physical Exam:  Vitals:   09/26/18 0856  BP: (!) 167/84  Pulse: (!) 57  Temp: 98.4 F (36.9 C)  TempSrc: Oral  SpO2: 100%  Weight: 158 lb 1.6 oz (71.7 kg)   General: A/O x4, in acute distress, afebrile, nondiaphoretic Cardio: RRR, no mrg's auscultated Pulm: CTA bilaterally   Assessment & Plan:   See Encounters Tab for problem based charting.  Patient discussed with Dr. Daryll Drown

## 2018-09-26 NOTE — Patient Instructions (Addendum)
FOLLOW-UP INSTRUCTIONS When: 1 week For: CMG monitoring What to bring: All of your medications  Thank you for your visit to the [facility] today. Given your risk for potential coronary artery disease. As you have preferred to be released home with outpatient stress testing we will advise that you discuss your risks with cardiology and consider a stress test if they are agreeable. I will place the consult to cardiology.   You are to immediately proceed to the emergency department if your chest pain returns, fails to resolve is associated with sweating or nausea and or you feel week and short of breath.   If you have any questions please let us know.   Also, Please return in 7 days with [place] for Download #2.

## 2018-09-26 NOTE — Progress Notes (Signed)
Initial visit:  MNT1  Medical Nutrition Therapy:  Appt start time: 0930 end time:  948. Last dietitian visit 8/8 and 8/15 in 2018 Assessment:  Primary concerns today: Weight management and Blood sugar control. She continues to be concerned about her weight loss and access to food. She asks about cars for sale.   Usual eating pattern includes 1 meal and 1-2 snacks per day. Everyday foods include peanut butter, cereal and milk, 23$ in foods stamps a month   24-hr recall: B ( AM)- coffee with cream and sugar; D ( ~ 4PM)-   Usual physical activity includes walks to store a few times a week. MEDICATIONS stopped truicity over a month ago, takes 36 units lantus at night ( being reduced today due to hypoglycemia to 32 units Progress Towards Goal(s):  In progress.   Nutritional Diagnosis:  NI-5.8.4 Inconsistent carbohydrate intake As related to  eating one meal per day and higher evening blood sugars.  As evidenced by her blood sugar pattern of increasing blood sugars over the course of the day.    Intervention:  Nutrition education about consistent carb meal planning encouraging her to eat more earlier in the day to help both her weight and her blood sugar. Will try to assist her with transportation to grocery and food pantries. Samples and coupons for boost given to her today.  Monitoring/Evaluation:  Dietary intake, exercise, meter and CGM sensor, and body weight in 1 week(s)  Debera Lat, RD 09/26/2018 9:57 AM. .

## 2018-09-26 NOTE — Assessment & Plan Note (Signed)
Diabetes: Susan Holmes wore the CGM for 5 days. The average reading was 117, % time in target was 87, % time below target was 6, and % time above target was. 7. Intervention will be to decrease her Lantus to 32U nightly. The patient will be scheduled to be seen in [facility] for a final appointment.

## 2018-09-26 NOTE — Patient Instructions (Signed)
Appointment with Dr. Midge Aver is : November Monday Nov 4th at 9:45 AM [phone]  TRy to eat more in the morning and afternoon- whatever you have and want is fine. Just have a bit more.  Can try boost/glucerna on cereal or  a piece of toast with peanut butter Egg- scrambled or hard boiled Tuna salad  See you next week.  [name] [phone]

## 2018-09-26 NOTE — Assessment & Plan Note (Addendum)
  Chest pressure: The patient endorsed a 1.70-month history of random onset left-sided chest pressure that is associated with shortness of breath and diaphoresis as well as radiating into her left shoulder with the feeling of heaviness.  She states that there is no known precursor, trigger, or prodrome that she is aware of and it is not made worse with activity, although she is not particularly active. It is self resolving after approximately 7 to 8 minutes and she has not tried additional treatment. The patient states that it is somewhat similar in nature to her prior GERD symptoms but not completely consistent. She has risk factors of family history, smoking, and diabetes.   Plan: EKG-reassuring, no notable ischemic changes Troponin (given current pressure), <0.03 NTG sublingual with only possible mild relief  Consider stress testing Heart score or 3-4 based on negative trop and reassuring EKG with HPI and risk factors. She was presented with the option to be admitted for observation which she declined. She would prefer outpatient evaluation by cardiology, referral has been placed.    Addendum: Stress test performed. Determined to be low risk. Patient not willing to quit smoking. She had been notified of these results. Continue risk factor modification.

## 2018-09-26 NOTE — Telephone Encounter (Signed)
I was asked by front office to call patient to arrange for CGM follow up visit #2. She told them she could not come in next week.  Unable to reach her by phone

## 2018-09-27 LAB — LIPID PANEL
Chol/HDL Ratio: 3.9 ratio (ref 0.0–4.4)
Cholesterol, Total: 206 mg/dL — ABNORMAL HIGH (ref 100–199)
HDL: 53 mg/dL (ref >39)
LDL Calculated: 133 mg/dL — ABNORMAL HIGH (ref 0–99)
Triglycerides: 100 mg/dL (ref 0–149)
VLDL Cholesterol Cal: 20 mg/dL (ref 5–40)

## 2018-09-27 NOTE — Progress Notes (Signed)
Noted in epic pt had pcp visit yesterday 09-26-2018 with compliant of chest pain/ pressure.  Per pcp note referral made for cardiology for stress test.  Called and lvm for pam, or scheduler for dr Jeffie Pollock, that would need to have clearance for surgery due to chest pain.

## 2018-09-27 NOTE — Progress Notes (Signed)
Internal Medicine Clinic Attending  Case discussed with Dr. Harbrecht at the time of the visit.  We reviewed the resident's history and exam and pertinent patient test results.  I agree with the assessment, diagnosis, and plan of care documented in the resident's note.   

## 2018-09-27 NOTE — Telephone Encounter (Signed)
I could not get through to patient by phone. Scheduled her for an appointment for to remove CGM and for download 2 on Monday , October 02, 2018. Mailed appointment notice to Susan Holmes

## 2018-09-27 NOTE — Addendum Note (Signed)
Addended by: Nicola Girt on: 09/27/2018 05:15 PM   Modules accepted: Orders

## 2018-09-28 ENCOUNTER — Encounter: Payer: Self-pay | Admitting: Internal Medicine

## 2018-09-28 ENCOUNTER — Other Ambulatory Visit: Payer: Self-pay | Admitting: Internal Medicine

## 2018-09-28 ENCOUNTER — Other Ambulatory Visit: Payer: Self-pay

## 2018-09-28 ENCOUNTER — Encounter (HOSPITAL_COMMUNITY): Payer: Self-pay

## 2018-09-28 ENCOUNTER — Encounter (HOSPITAL_COMMUNITY)
Admission: RE | Admit: 2018-09-28 | Discharge: 2018-09-28 | Disposition: A | Discharge status: Home / Self Care | Payer: Medicare HMO | Source: Ambulatory Visit | Attending: Urology | Admitting: Urology

## 2018-09-28 DIAGNOSIS — Z01812 Encounter for preprocedural laboratory examination: Secondary | ICD-10-CM | POA: Diagnosis not present

## 2018-09-28 DIAGNOSIS — N200 Calculus of kidney: Secondary | ICD-10-CM | POA: Diagnosis not present

## 2018-09-28 DIAGNOSIS — R6889 Other general symptoms and signs: Secondary | ICD-10-CM | POA: Diagnosis not present

## 2018-09-28 HISTORY — DX: Chest pain, unspecified: R07.9

## 2018-09-28 HISTORY — DX: Personal history of transient ischemic attack (TIA), and cerebral infarction without residual deficits: Z86.73

## 2018-09-28 HISTORY — DX: Unspecified glaucoma: H40.9

## 2018-09-28 HISTORY — DX: Atherosclerotic heart disease of native coronary artery without angina pectoris: I25.10

## 2018-09-28 HISTORY — DX: Other specified personal risk factors, not elsewhere classified: Z91.89

## 2018-09-28 HISTORY — DX: Calculus of kidney: N20.0

## 2018-09-28 LAB — BASIC METABOLIC PANEL
Anion gap: 12 (ref 5–15)
BUN: 9 mg/dL (ref 8–23)
CO2: 24 mmol/L (ref 22–32)
Calcium: 9.7 mg/dL (ref 8.9–10.3)
Chloride: 109 mmol/L (ref 98–111)
Creatinine, Ser: 0.98 mg/dL (ref 0.44–1.00)
GFR calc Af Amer: >60 mL/min (ref >60)
GFR calc non Af Amer: >60 mL/min (ref >60)
Glucose, Bld: 163 mg/dL — ABNORMAL HIGH (ref 70–99)
Potassium: 4.1 mmol/L (ref 3.5–5.1)
Sodium: 145 mmol/L (ref 135–145)

## 2018-09-28 LAB — CBC
HCT: 39.5 % (ref 36.0–46.0)
Hemoglobin: 12.3 g/dL (ref 12.0–15.0)
MCH: 22.1 pg — ABNORMAL LOW (ref 26.0–34.0)
MCHC: 31.1 g/dL (ref 30.0–36.0)
MCV: 70.9 fL — ABNORMAL LOW (ref 78.0–100.0)
Platelets: 210 10*3/uL (ref 150–400)
RBC: 5.57 MIL/uL — ABNORMAL HIGH (ref 3.87–5.11)
RDW: 17.7 % — ABNORMAL HIGH (ref 11.5–15.5)
WBC: 5.6 10*3/uL (ref 4.0–10.5)

## 2018-09-28 LAB — GLUCOSE, CAPILLARY: Glucose-Capillary: 182 mg/dL — ABNORMAL HIGH (ref 70–99)

## 2018-09-28 LAB — ABO/RH: ABO/RH(D): A POS

## 2018-09-28 MED ORDER — ROSUVASTATIN CALCIUM 10 MG PO TABS: 10.0000 mg | ORAL_TABLET | Freq: Every day | ORAL | 0 refills | Status: DC

## 2018-09-28 NOTE — Addendum Note (Signed)
Addended by: Nicola Girt on: 09/28/2018 01:05 PM   Modules accepted: Orders

## 2018-09-28 NOTE — Progress Notes (Signed)
Notified and spoke w/ pam, or scheduler for dr Jeffie Pollock, via phone.  That patient has cardiologist appointment 09-29-2018 @ 1000 for surgical clearance w/ dr Skeet Latch.

## 2018-09-28 NOTE — Progress Notes (Signed)
EKG in epic dated 09-26-2018.

## 2018-09-28 NOTE — Patient Instructions (Addendum)
GLENDELL SCHLOTTMAN  [birth year]    Your procedure is scheduled on:  10-03-2018   Report to [facility] Main  Entrance,  Report to radiology at 8:00 AM    Call this number if you have problems the morning of surgery [phone]    Remember: Do not eat food or drink liquids :After Midnight. BRUSH YOUR TEETH MORNING OF SURGERY AND RINSE YOUR MOUTH OUT, NO CHEWING GUM CANDY OR MINTS.      ALSO, NO CHEW TOBACCO.   Take these medicines the morning of surgery with A SIP OF WATER:  Duloxetine (cymbalta), Pravastatin, Carvedilol, Amlodipine, Pantoprazole                                You may not have any metal on your body including hair pins and              piercings                Do not wear jewelry, make-up, lotions, powders or perfumes, deodorant                          Do not wear nail polish.  Do not shave  48 hours prior to surgery.          Do not bring valuables to the hospital. [place].  Contacts, dentures or bridgework may not be worn into surgery.  Leave suitcase in the car. After surgery it may be brought to your room.     Patients discharged the day of surgery will not be allowed to drive home.  Name and phone number of your driver:  Special Instructions: N/A              Please read over the following fact sheets you were given: _____________________________________________________________________   How to Manage Your Diabetes Before and After Surgery  Why is it important to control my blood sugar before and after surgery? . Improving blood sugar levels before and after surgery helps healing and can limit problems. . A way of improving blood sugar control is eating a healthy diet by: o  Eating less sugar and carbohydrates o  Increasing activity/exercise o  Talking with your doctor about reaching your blood sugar goals . High blood sugars (greater than 180 mg/dL) can raise your risk of  infections and slow your recovery, so you will need to focus on controlling your diabetes during the weeks before surgery. . Make sure that the doctor who takes care of your diabetes knows about your planned surgery including the date and location.  How do I manage my blood sugar before surgery? . Check your blood sugar at least 4 times a day, starting 2 days before surgery, to make sure that the level is not too high or low. o Check your blood sugar the morning of your surgery when you wake up and every 2 hours until you get to the Short Stay unit. . If your blood sugar is less than 70 mg/dL, you will need to treat for low blood sugar: o Do not take insulin. o Treat a low blood sugar (less than 70 mg/dL) with  cup of clear juice (cranberry or apple),  4 glucose tablets, OR glucose gel. o Recheck blood sugar in 15 minutes after treatment (to make sure it is greater than 70 mg/dL). If your blood sugar is not greater than 70 mg/dL on recheck, call [phone] for further instructions. . Report your blood sugar to the short stay nurse when you get to Short Stay.  . If you are admitted to the hospital after surgery: o Your blood sugar will be checked by the staff and you will probably be given insulin after surgery (instead of oral diabetes medicines) to make sure you have good blood sugar levels. o The goal for blood sugar control after surgery is 80-180 mg/dL.   WHAT DO I DO ABOUT MY DIABETES MEDICATION?  . THE NIGHT BEFORE SURGERY, take    16 units of   Lantus    insulin.      . The day of surgery, do not take other diabetes injectables, including Byetta (exenatide), Bydureon (exenatide ER), Victoza (liraglutide), or Trulicity (dulaglutide).              [place] - Preparing for Surgery Before surgery, you can play an important role.  Because skin is not sterile, your skin needs to be as free of germs as possible.  You can reduce the number of germs on your skin by washing with CHG  (chlorahexidine gluconate) soap before surgery.  CHG is an antiseptic cleaner which kills germs and bonds with the skin to continue killing germs even after washing. Please DO NOT use if you have an allergy to CHG or antibacterial soaps.  If your skin becomes reddened/irritated stop using the CHG and inform your nurse when you arrive at Short Stay. Do not shave (including legs and underarms) for at least 48 hours prior to the first CHG shower.  You may shave your face/neck. Please follow these instructions carefully:  1.  Shower with CHG Soap the night before surgery and the  morning of Surgery.  2.  If you choose to wash your hair, wash your hair first as usual with your  normal  shampoo.  3.  After you shampoo, rinse your hair and body thoroughly to remove the  shampoo.                                        4.  Use CHG as you would any other liquid soap.  You can apply chg directly  to the skin and wash                       Gently with a scrungie or clean washcloth.  5.  Apply the CHG Soap to your body ONLY FROM THE NECK DOWN.   Do not use on face/ open                           Wound or open sores. Avoid contact with eyes, ears mouth and genitals (private parts).                       Wash face,  Genitals (private parts) with your normal soap.             6.  Wash thoroughly, paying special attention to the area where your surgery  will be performed.  7.  Thoroughly rinse your body with warm water  from the neck down.  8.  DO NOT shower/wash with your normal soap after using and rinsing off  the CHG Soap.             9.  Pat yourself dry with a clean towel.            10.  Wear clean pajamas.            11.  Place clean sheets on your bed the night of your first shower and do not  sleep with pets. Day of Surgery : Do not apply any lotions/deodorants the morning of surgery.  Please wear clean clothes to the hospital/surgery center.  FAILURE TO FOLLOW THESE INSTRUCTIONS MAY RESULT IN THE  CANCELLATION OF YOUR SURGERY PATIENT SIGNATURE_________________________________  NURSE SIGNATURE__________________________________  ________________________________________________________________________  WHAT IS A BLOOD TRANSFUSION? Blood Transfusion Information  A transfusion is the replacement of blood or some of its parts. Blood is made up of multiple cells which provide different functions.  Red blood cells carry oxygen and are used for blood loss replacement.  White blood cells fight against infection.  Platelets control bleeding.  Plasma helps clot blood.  Other blood products are available for specialized needs, such as hemophilia or other clotting disorders. BEFORE THE TRANSFUSION  Who gives blood for transfusions?   Healthy volunteers who are fully evaluated to make sure their blood is safe. This is blood bank blood. Transfusion therapy is the safest it has ever been in the practice of medicine. Before blood is taken from a donor, a complete history is taken to make sure that person has no history of diseases nor engages in risky social behavior (examples are intravenous drug use or sexual activity with multiple partners). The donor's travel history is screened to minimize risk of transmitting infections, such as malaria. The donated blood is tested for signs of infectious diseases, such as HIV and hepatitis. The blood is then tested to be sure it is compatible with you in order to minimize the chance of a transfusion reaction. If you or a relative donates blood, this is often done in anticipation of surgery and is not appropriate for emergency situations. It takes many days to process the donated blood. RISKS AND COMPLICATIONS Although transfusion therapy is very safe and saves many lives, the main dangers of transfusion include:   Getting an infectious disease.  Developing a transfusion reaction. This is an allergic reaction to something in the blood you were given. Every  precaution is taken to prevent this. The decision to have a blood transfusion has been considered carefully by your caregiver before blood is given. Blood is not given unless the benefits outweigh the risks. AFTER THE TRANSFUSION  Right after receiving a blood transfusion, you will usually feel much better and more energetic. This is especially true if your red blood cells have gotten low (anemic). The transfusion raises the level of the red blood cells which carry oxygen, and this usually causes an energy increase.  The nurse administering the transfusion will monitor you carefully for complications. HOME CARE INSTRUCTIONS  No special instructions are needed after a transfusion. You may find your energy is better. Speak with your caregiver about any limitations on activity for underlying diseases you may have. SEEK MEDICAL CARE IF:   Your condition is not improving after your transfusion.  You develop redness or irritation at the intravenous (IV) site. SEEK IMMEDIATE MEDICAL CARE IF:  Any of the following symptoms occur over the next 12 hours:  Shaking chills.  You have a temperature by mouth above 102 F (38.9 C), not controlled by medicine.  Chest, back, or muscle pain.  People around you feel you are not acting correctly or are confused.  Shortness of breath or difficulty breathing.  Dizziness and fainting.  You get a rash or develop hives.  You have a decrease in urine output.  Your urine turns a dark color or changes to pink, red, or brown. Any of the following symptoms occur over the next 10 days:  You have a temperature by mouth above 102 F (38.9 C), not controlled by medicine.  Shortness of breath.  Weakness after normal activity.  The white part of the eye turns yellow (jaundice).  You have a decrease in the amount of urine or are urinating less often.  Your urine turns a dark color or changes to pink, red, or brown. Document Released: 12/10/2000 Document  Revised: 03/06/2012 Document Reviewed: 07/29/2008 [facility] Patient Information 2014 [place], [place].  _______________________________________________________________________

## 2018-09-28 NOTE — Addendum Note (Signed)
Addended by: Nicola Girt on: 09/28/2018 01:38 PM   Modules accepted: Orders

## 2018-09-28 NOTE — Assessment & Plan Note (Signed)
Patient LDL of 133 with total of 206 was noted on lipid panel today. As such, we will discontinue her pravastatin and start lower dose rosuvastatin. If this is tolerated we will increase to a higher dose.

## 2018-09-28 NOTE — Progress Notes (Signed)
Patient came for PAT appointment today.  She denied at first about compliant of chest pain/pressure but when asked about her visit with her pcp and that he wanted her to have a stress test, she stated yes she understood he ordered a stress test but that she has not been contacted and also she was going to wait until after her surgery.  Explained to pt that anesthesia would want her to have the stress test and be cleared prior to having her surgery because chest pain is very important .  Advised pt to call pam, or scheduler for dr Jeffie Pollock and also call her pcp office hopefully she may get stress test done prior to her surgery date 10-03-2018, pt verbalized understanding.   Called and spoke w/ pam, or scheduler this and informed her about pt chest pain with her pcp visit 09-26-2018.

## 2018-09-28 NOTE — Progress Notes (Signed)
Notified patient via mail of her results and plan to stop pravastatin and start rosuvastatin.   [employer]

## 2018-09-29 ENCOUNTER — Telehealth (HOSPITAL_COMMUNITY): Payer: Self-pay | Admitting: *Deleted

## 2018-09-29 ENCOUNTER — Ambulatory Visit (INDEPENDENT_AMBULATORY_CARE_PROVIDER_SITE_OTHER): Payer: Medicare HMO | Admitting: Cardiovascular Disease

## 2018-09-29 ENCOUNTER — Encounter: Payer: Self-pay | Admitting: Cardiovascular Disease

## 2018-09-29 VITALS — BP 136/96 | HR 63 | Ht 67.0 in | Wt 156.0 lb

## 2018-09-29 DIAGNOSIS — R079 Chest pain, unspecified: Secondary | ICD-10-CM | POA: Diagnosis not present

## 2018-09-29 DIAGNOSIS — I10 Essential (primary) hypertension: Secondary | ICD-10-CM | POA: Diagnosis not present

## 2018-09-29 DIAGNOSIS — Z01818 Encounter for other preprocedural examination: Secondary | ICD-10-CM | POA: Diagnosis not present

## 2018-09-29 DIAGNOSIS — Z5181 Encounter for therapeutic drug level monitoring: Secondary | ICD-10-CM | POA: Diagnosis not present

## 2018-09-29 DIAGNOSIS — E7800 Pure hypercholesterolemia, unspecified: Secondary | ICD-10-CM

## 2018-09-29 DIAGNOSIS — Z720 Tobacco use: Secondary | ICD-10-CM | POA: Diagnosis not present

## 2018-09-29 LAB — URINE CULTURE: Culture: NO GROWTH

## 2018-09-29 LAB — HEMOGLOBIN A1C
Hgb A1c MFr Bld: 6.3 % — ABNORMAL HIGH (ref 4.8–5.6)
Mean Plasma Glucose: 134 mg/dL

## 2018-09-29 MED ORDER — CHLORTHALIDONE 25 MG PO TABS: ORAL_TABLET | ORAL | 1 refills | Status: DC

## 2018-09-29 NOTE — Patient Instructions (Addendum)
Medication Instructions:  START CHLORTHALIDONE 12.5 MG DAILY   If you need a refill on your cardiac medications before your next appointment, please call your pharmacy.   Lab work: BMET IN 1 WEEK  If you have labs (blood work) drawn today and your tests are completely normal, you will receive your results only by: Marland Kitchen MyChart Message (if you have MyChart) OR . A paper copy in the mail If you have any lab test that is abnormal or we need to change your treatment, we will call you to review the results.  Testing/Procedures: Your physician has requested that you have a lexiscan myoview. For further information please visit [email or web address]. Please follow instruction sheet, as given. MONDAY  Follow-Up: At [facility], you and your health needs are our priority.  As part of our continuing mission to provide you with exceptional heart care, we have created designated Provider Care Teams.  These Care Teams include your primary Cardiologist (physician) and Advanced Practice Providers (APPs -  Physician Assistants and Nurse Practitioners) who all work together to provide you with the care you need, when you need it. You will need a follow up appointment in 6 weeks.   You may see DR [facility] or one of the following Advanced Practice Providers on your designated Care Team:   Kerin Ransom, PA-C Roby Lofts, [place] . Sande Rives, PA-C  Any Other Special Instructions Will Be Listed Below (If Applicable)

## 2018-09-29 NOTE — Telephone Encounter (Signed)
Patient given detailed instructions per Myocardial Perfusion Study Information Sheet for the test on 10/02/18 at 7:15. Patient notified to arrive 15 minutes early and that it is imperative to arrive on time for appointment to keep from having the test rescheduled.  If you need to cancel or reschedule your appointment, please call the office within 24 hours of your appointment. . Patient verbalized understanding.Susan Holmes

## 2018-09-29 NOTE — Progress Notes (Signed)
Cardiology Office Note   Date:  09/29/2018   ID:  Susan Holmes, DOB April 25, 1956, MRN [id number]  PCP:  Corinne Ports, MD  Cardiologist:   Skeet Latch, MD   No chief complaint on file.    History of Present Illness: Susan Holmes is a 62 y.o. female with diabetes mellitus type 2, hypertension, hyperlipidemia, non-obstructive CAD,  carotid stenosis, stroke and tobacco abuse who presents for follow up. Susan Holmes was seen 08/13/16 with chest pain. She  was referred for [facility] 08/24/16 that revealed LVEF 52% and was negative for ischemia. She was also noted to have a history of ischemic stroke and had not undergone TEE. She had a TEE on 08/17/16 that was negative for thrombus and did not reveal an ASD or PFO.  Carotid Doppler showed moderate L ECA stenosis and mild L ICA stenosis.  She was started on aspirin and atorvastatin 80 mg but this was stopped due to myalgias.  She did report frequent palpitations and had a 30 day event monitor placed that revealed occasional PACs and PVCs.  Susan Holmes previously had an echo 09/2014 that revealed LVEF 40-45% with diffuse hypokinesis and trace MR.  She had a nuclear stress test that showed LVEF 38% with a fixed apical defect.  She had a LHC at that time without obstructive coronary disease and her EF on left ventriculography was 55% and LVEDP was 10 mmHg.  She was referred to pulmonology and saw Dr. Vaughan Browner.  PFTs did not show clear obstruction.  She was started on Symbicort 04/2017.  Susan Holmes reports that for the last 2 or 3 months she has had some intermittent chest discomfort.  She describes it as a heavy pressure in her chest and a dull pain that radiates into her left arm.  It is sometimes associated with shortness of breath and diaphoresis.  There is no nausea.  The episodes sometimes occur when at rest.  She notices that when sitting in bed if she elevates her left arm it seems to get better.  However she also has it with  exertion.  For the last couple weeks she is been trying to walk for exercise and seems to get the discomfort with exertion.  This last week that occurred while sitting.  It lasted for approximately 30 minutes.  She is scheduled to have kidney stones removed on Tuesday.  When she noted that she was had intermittent chest pain she was referred to cardiology for clearance prior to her procedure.  She continues smoking and is not ready to quit.   Past Medical History:  Diagnosis Date  . Anxiety   . Arthritis   . Chest pain 09/26/2018  . Congenital blindness    Left  eye  . Coronary artery disease    per cardiac cath 2015-- mild disease involving LAD and branches ,  LCFx and branches, and mild disease pRCA  . Depression   . Diabetes mellitus, type II ([place])    followed by pcp  . Glaucoma   . Glaucoma, both eyes   . Hepatitis B infection pt unsure   resolved  . History of transient ischemic attack (TIA) 2017   per pt no residuals  . HTN (hypertension)    followed by pcp  . Hyperlipidemia   . Poor dental hygiene   . Renal calculus, right   . Stroke ([place])   . Tobacco abuse    .5 PPD smoker    Past Surgical  History:  Procedure Laterality Date  . ABDOMINAL HYSTERECTOMY  1980s   unsure if ovaries were taken  . GLAUCOMA SURGERY Right 2017  . LEFT HEART CATHETERIZATION WITH CORONARY ANGIOGRAM N/A 10/09/2014   Procedure: LEFT HEART CATHETERIZATION WITH CORONARY ANGIOGRAM;  Surgeon: Troy Sine, MD;  Location: [facility] CATH LAB;  Service: Cardiovascular;  Laterality: N/A;  . TEE WITHOUT CARDIOVERSION N/A 08/17/2016   Procedure: TRANSESOPHAGEAL ECHOCARDIOGRAM (TEE);  Surgeon: Sueanne Margarita, MD;  Location: [facility] ENDOSCOPY;  Service: Cardiovascular;  Laterality: N/A;    Current Outpatient Medications  Medication Sig Dispense Refill  . ACCU-CHEK FASTCLIX LANCETS MISC CHECK BLOOD SUGAR THREE TIMES DAILY AS DIRECTED 306 each 5  . amLODipine (NORVASC) 10 MG tablet Take 1 tablet (10 mg total) by mouth  daily. (Patient taking differently: Take 5 mg by mouth every morning. ) 60 tablet 3  . aspirin EC 81 MG tablet Take 81 mg by mouth daily.    . Blood Glucose Monitoring Suppl (ACCU-CHEK GUIDE) w/Device KIT 1 each by Does not apply route 3 (three) times daily. 1 kit 0  . budesonide-formoterol (SYMBICORT) 160-4.5 MCG/ACT inhaler Inhale 2 puffs into the lungs 2 (two) times daily. (Patient taking differently: Inhale 2 puffs into the lungs 2 (two) times daily as needed. ) 1 Inhaler 6  . carvedilol (COREG) 12.5 MG tablet TAKE 1 TABLET(12.5 MG) BY MOUTH TWICE DAILY (Patient taking differently: Take 12.5 mg by mouth 2 (two) times daily with a meal. ) 60 tablet 6  . docusate sodium (COLACE) 100 MG capsule Take 1 capsule (100 mg total) by mouth 2 (two) times daily. 10 capsule 0  . dorzolamide-timolol (COSOPT) 22.3-6.8 MG/ML ophthalmic solution Place 1 drop into both eyes 2 (two) times daily.     . Dulaglutide (TRULICITY) 2.77 AJ/2.8NO SOPN INJECT 0.75 MG UNDER THE SKIN ONCE A WEEK( DISCONTINUE BYETTA) 2 mL 5  . DULoxetine (CYMBALTA) 30 MG capsule TAKE 1 CAPSULE(30 MG) BY MOUTH DAILY (Patient taking differently: Take 30 mg by mouth every morning. ) 90 capsule 0  . glucose blood (ONE TOUCH ULTRA TEST) test strip USE TO TEST BLOOD GLUCOSE 3 TIMES A DAY. Diagnosis code: E11.41 100 each 11  . Insulin Glargine (LANTUS SOLOSTAR) 100 UNIT/ML Solostar Pen Inject 32 Units into the skin at bedtime. (Patient taking differently: Inject 32 Units into the skin at bedtime. ) 45 mL 1  . Insulin Pen Needle (VALUMARK PEN NEEDLES) 31G X 8 MM MISC 1 each by Does not apply route daily. 100 each 3  . Loperamide HCl 1 MG/7.5ML LIQD Take 30 mLs (4 mg total) by mouth 3 (three) times daily as needed. 150 mL 0  . losartan (COZAAR) 100 MG tablet Take 1 tablet (100 mg total) by mouth daily. (Patient taking differently: Take 100 mg by mouth every morning. ) 90 tablet 3  . mirtazapine (REMERON) 45 MG tablet Take 45 mg by mouth at bedtime.    .  Multiple Vitamins-Minerals (CENTRUM SILVER PO) Take 1 tablet by mouth daily.    . pantoprazole (PROTONIX) 40 MG tablet TAKE 1 TABLET BY MOUTH DAILY (Patient taking differently: Take 40 mg by mouth every morning. ) 90 tablet 0  . rosuvastatin (CRESTOR) 10 MG tablet Take 1 tablet (10 mg total) by mouth daily. 30 tablet 0  . traZODone (DESYREL) 100 MG tablet Take 300 mg by mouth at bedtime.     . chlorthalidone (HYGROTON) 25 MG tablet 1/2 TABLET BY MOUTH DAILY 45 tablet 1   No current  facility-administered medications for this visit.     Allergies:   Ace inhibitors; Chantix [varenicline]; and Lipitor [atorvastatin]    Social History:  The patient  reports that she has been smoking cigarettes. She has a 9.00 pack-year smoking history. Her smokeless tobacco use includes chew. She reports that she does not drink alcohol or use drugs.   Family History:  The patient's family history includes Aneurysm in her brother and sister; CVA in her brother, maternal grandfather, maternal grandmother, mother, and sister; Heart disease (age of onset: 7) in her father and mother; Hypertension in her mother.    ROS:  Please see the history of present illness.   Otherwise, review of systems are positive for sinus congestion, clear phlegm.   All other systems are reviewed and negative.    PHYSICAL EXAM: VS:  BP (!) 136/96 (BP Location: Left Arm, Patient Position: Sitting, Cuff Size: Normal)   Pulse 63   Ht '5\' 7"'$  (1.702 m)   Wt 156 lb (70.8 kg)   BMI 24.43 kg/m  , BMI Body mass index is 24.43 kg/m. GENERAL:  Well appearing HEENT: Pupils equal round and reactive, fundi not visualized, oral mucosa unremarkable NECK:  No jugular venous distention, waveform within normal limits, carotid upstroke brisk and symmetric, no bruits LUNGS:  Clear to auscultation bilaterally HEART:  RRR.  PMI not displaced or sustained,S1 and S2 within normal limits, no S3, no S4, no clicks, no rubs, II/VI systolic murmur at the  LUSB ABD:  Flat, positive bowel sounds normal in frequency in pitch, no bruits, no rebound, no guarding, no midline pulsatile mass, no hepatomegaly, no splenomegaly EXT:  2 plus pulses throughout, no edema, no cyanosis no clubbing SKIN:  No rashes no nodules NEURO:  Cranial nerves II through XII grossly intact, motor grossly intact throughout PSYCH:  Cognitively intact, oriented to person place and time   EKG:  EKG is not ordered today. The ekg ordered 08/13/16 demonstrates sinus rhythm rate 83 bpm.  Non-specific ST-T changes. Sinus rhythm. Rate 76 bpm.  30 Day Event Monitor 09/01/16: Quality: Fair.  Baseline artifact. Average heart rate: 80 bpm Pauses >2.5 seconds: 0 Occasional PACs and PVCs  TEE 08/17/16: Study Conclusions  - Left ventricle: Systolic function was normal. The estimated   ejection fraction was in the range of 50% to 55%. Wall motion was   normal; there were no regional wall motion abnormalities. - Mitral valve: There was mild regurgitation. - Left atrium: No evidence of thrombus in the atrial cavity or   appendage. No evidence of thrombus in the atrial cavity or   appendage. - Right atrium: No evidence of thrombus in the atrial cavity or   appendage. - Tricuspid valve: There was trivial regurgitation. - Pulmonic valve: No evidence of vegetation. There was trivial   regurgitation.  Lexiscan Myoview 08/24/16:  The left ventricular ejection fraction is normal (55-65%).  The computer generated EF is 52%. Visually, the EF is closer to 60% and is normal.  There was no ST segment deviation noted during stress.  The study is normal.  This is a low risk study.  Echo 09/2014: LVEF 40-45% with diffuse hypokinesis and trace MR.    Nuclear stress test 09/2014: LVEF 38% with a fixed apical defect. LHC 10/2014: No obstructive coronary disease.  EF on left ventriculography was 55% and LVEDP of 10 mmHg.    Carotid Doppler 07/24/16: >50% L ECA stenosis; 1-39% L ICA  stenosis  Echo 07/24/16: LVEF 55-60%.  Restrictive physiology  was noted.   Recent Labs: 06/13/2018: ALT 18; TSH 0.509 09/28/2018: BUN 9; Creatinine, Ser 0.98; Hemoglobin 12.3; Platelets 210; Potassium 4.1; Sodium 145    Lipid Panel    Component Value Date/Time   CHOL 206 (H) 09/26/2018 0949   TRIG 100 09/26/2018 0949   HDL 53 09/26/2018 0949   CHOLHDL 3.9 09/26/2018 0949   CHOLHDL 3.4 04/26/2017 1403   VLDL 31 (H) 04/26/2017 1403   LDLCALC 133 (H) 09/26/2018 0949   LDLDIRECT 183 (H) 03/01/2014 0500      Wt Readings from Last 3 Encounters:  09/29/18 156 lb (70.8 kg)  09/28/18 158 lb 6 oz (71.8 kg)  09/26/18 158 lb 1.6 oz (71.7 kg)      ASSESSMENT AND PLAN:  # Chest pain: LHC in 2015 revealed nonobstructive CAD.  She had a stress test in 2017 that was negative.  Given that she has symptoms now that are different than at that time we will repeat a Lexiscan Myoview.  She will need this prior to her upcoming procedure.  # Hypertension: # PVCs: BP is above goal both here and at home.  We will add chlorthalidone 12.5 mg daily.  Check a basic metabolic panel in 1 week.  Continue amlodipine, carvedilol, and losartan.  # Hyperlipidemia: # Myalgias: She is tolerating rosuvastatin..   # Tobacco abuse: Susan Holmes is not ready to quit smoking today.  # Stroke:  # Carotid stenosis: Continue aspirin and rosuvastatin.  # Shortness of breath: Stable.  Encouraged exercise.    Current medicines are reviewed at length with the patient today.  The patient does not have concerns regarding medicines.  The following changes have been made:  Start chlorthalidone. Labs/ tests ordered today include:   Orders Placed This Encounter  Procedures  . Basic metabolic panel  . MYOCARDIAL PERFUSION IMAGING     Disposition:   FU with Alwyn Cordner C. Oval Linsey, MD, [facility] in 6 weeks.     Signed, Rosalynd Mcwright C. Oval Linsey, MD, [facility]  09/29/2018 11:14 AM    [place]

## 2018-09-29 NOTE — Progress Notes (Signed)
Noted in epic pt had cardiologist appointment today and pt is scheduled to have stress test done Monday 10-02-2018 at 0730.  Called and with pam, or scheduler for dr Jeffie Pollock, via phone informed of the stress test and clearance would determined after that.  Pam will not be in her office on Monday , told to let selita know about final decision.

## 2018-09-30 ENCOUNTER — Other Ambulatory Visit: Payer: Self-pay | Admitting: Internal Medicine

## 2018-09-30 DIAGNOSIS — E785 Hyperlipidemia, unspecified: Principal | ICD-10-CM

## 2018-09-30 DIAGNOSIS — E1169 Type 2 diabetes mellitus with other specified complication: Secondary | ICD-10-CM

## 2018-10-02 ENCOUNTER — Ambulatory Visit: Payer: Medicare HMO | Admitting: Dietician

## 2018-10-02 ENCOUNTER — Encounter: Payer: Self-pay | Admitting: *Deleted

## 2018-10-02 ENCOUNTER — Ambulatory Visit (HOSPITAL_COMMUNITY): Payer: Medicare HMO | Attending: Cardiovascular Disease

## 2018-10-02 ENCOUNTER — Other Ambulatory Visit: Payer: Self-pay | Admitting: Radiology

## 2018-10-02 DIAGNOSIS — R079 Chest pain, unspecified: Secondary | ICD-10-CM | POA: Diagnosis not present

## 2018-10-02 DIAGNOSIS — Z01818 Encounter for other preprocedural examination: Secondary | ICD-10-CM | POA: Insufficient documentation

## 2018-10-02 LAB — MYOCARDIAL PERFUSION IMAGING
LV dias vol: 97 mL (ref 46–106)
LV sys vol: 42 mL
Peak HR: 94 {beats}/min
Rest HR: 53 {beats}/min
SDS: 0
SRS: 0
SSS: 0
TID: 1.11

## 2018-10-02 MED ORDER — REGADENOSON 0.4 MG/5ML IV SOLN
0.4000 mg | Freq: Once | INTRAVENOUS | Status: AC
  Administered 2018-10-02: 0.4 mg via INTRAVENOUS

## 2018-10-02 MED ORDER — TECHNETIUM TC 99M TETROFOSMIN IV KIT
31.3000 | PACK | Freq: Once | INTRAVENOUS | Status: AC | PRN
  Administered 2018-10-02: 31.3 via INTRAVENOUS
  Filled 2018-10-02: qty 32

## 2018-10-02 MED ORDER — TECHNETIUM TC 99M TETROFOSMIN IV KIT
10.2000 | PACK | Freq: Once | INTRAVENOUS | Status: AC | PRN
  Administered 2018-10-02: 10.2 via INTRAVENOUS
  Filled 2018-10-02: qty 11

## 2018-10-02 NOTE — H&P (Signed)
CC: I have kidney stones.  HPI: Susan Holmes is a 62 year-old female established patient who is here for renal calculi.  The problem is on the right side.   Susan Holmes returns today in f/u follow a CT hematuria study done to reassess her stones and a possible RLP mass. The study shows some interval grown in the renal stones. The RUP stone is 24mm and there are smaller mid and lower pole stones. There was a 78mm nodule in the right lung base as well. 12 month f/u was recommended since she is a smoker. She continues to have some abdominal pain and weight loss. She has no new voiding complaints and her urine culture was negative. She remains on tamsulosin for her history of incomplete emptying, but she also has some urgency with UUI. She has alternating diarrhea and constipation.      ALLERGIES: Ace Inhibitors    MEDICATIONS: Amlodipine Besylate 5 mg tablet 1 tablet PO Daily  Aspirin Ec 81 mg tablet, delayed release  Dorzolamide-Timolol 22.3 mg-6.8 mg/ml drops 1 PO Daily  Humalog Kwikpen U-100 100 unit/ml insulin pen 1 PO Daily  Lantus Solostar 100 unit/ml (3 ml) insulin pen 1 PO Daily  Losartan Potassium 100 mg tablet 1 tablet PO Daily  Mirtazapine 45 mg tablet 1 tablet PO Daily  Ondansetron Odt 4 mg tablet,disintegrating 1 tablet PO Daily  Pilocarpine Hcl 1 % drops 1 PO Daily  Pravastatin Sodium 10 mg tablet 1 tablet PO Daily  Tamsulosin Hcl 0.4 mg capsule 1 capsule PO Daily  Trazodone Hcl 100 mg tablet 1 tablet PO Daily  Trulicity 1.5 ZD/6.3 ml pen injector 1 PO Daily     GU PSH: Locm 300-399Mg /Ml Iodine,1Ml - 08/30/2018    NON-GU PSH: None         GU PMH: Renal calculus, She needs a CT hematuria study to reassess her renal stones and the right posterior renal lesion. - 08/22/2018, - 01/12/2018, She has bilateral renal stones without obstruction. I will have her return with a KUB to reassess the stones particularly on the left which weren't well defined on the CT with contrast. , -  12/08/2017 Right renal neoplasm - 08/22/2018 Incomplete bladder emptying - 01/12/2018 Urinary Tract Inf, Unspec site - 87/56/4332 Periumbilical pain, Her pain is more consistent with a GI process with post prandial onset, anorexia and weight loss. I will make a GI referral. - 12/08/2017       PMH Notes: stomach ulcers      NON-GU PMH: Bacteriuria, I will get a culture today. She will return with the studies. - 08/22/2018 Anxiety Depression Diabetes Type 2 Hypercholesterolemia Hypertension     FAMILY HISTORY: Hypertension - Mother    SOCIAL HISTORY: Marital Status: Single Preferred Language: English; Race: Black or African American Current Smoking Status: Patient smokes.  <DIV'  Tobacco Use Assessment Completed:  Used Tobacco in last 30 days?   Drinks 1 caffeinated drink per day.      Notes: 1 daughter     REVIEW OF SYSTEMS:     GU Review Female:  Patient reports hard to postpone urination. Patient denies frequent urination, burning /pain with urination, get up at night to urinate, leakage of urine, stream starts and stops, trouble starting your stream, have to strain to urinate, and being pregnant.    Gastrointestinal (Upper):  Patient reports nausea. Patient denies vomiting and indigestion/ heartburn.    Gastrointestinal (Lower):  Patient denies diarrhea and constipation.    Constitutional:  Patient reports weight  loss. Patient denies fever, night sweats, and fatigue.    Skin:  Patient denies skin rash/ lesion and itching.    Eyes:  Patient denies blurred vision and double vision.    Ears/ Nose/ Throat:  Patient denies sore throat and sinus problems.    Hematologic/Lymphatic:  Patient denies swollen glands and easy bruising.    Cardiovascular:  Patient denies leg swelling and chest pains.    Respiratory:  Patient denies cough and shortness of breath.    Endocrine:  Patient denies excessive thirst.    Musculoskeletal:  Patient reports back pain. Patient denies joint pain.     Neurological:  Patient denies headaches and dizziness.    Psychologic:  Patient denies depression and anxiety.    VITAL SIGNS:       08/31/2018 01:59 PM     BP 178/96 mmHg     Pulse 59 /min     Temperature 98.3 F / 36.8 C     MULTI-SYSTEM PHYSICAL EXAMINATION:      Constitutional: Well-nourished. No physical deformities. Normally developed. Good grooming.     Respiratory: No labored breathing, no use of accessory muscles. CTA     Cardiovascular: Normal temperature, RRR without murmur            PAST DATA REVIEWED:   Source Of History:  Patient   PROCEDURES: None   ASSESSMENT:     ICD-10 Details  1 GU:  Renal calculus - N20.0 Worsening - She has some interval growth in her right renal stones with the largest 17.3mm in the RUP and smaller stones in the mid and lower pole. I discussed options and believe PCNL is the most appropriate option. I reviewed the risks of bleeding, infection, injury to the kidney and adjacent structures, renal loss, need for secondary procedures, urine leaks, thrombotic events and anesthetic complications. I will get her scheduled in the next few weeks.   2  Right renal neoplasm - D49.511 No renal mass was seen on CT. It was artifact most likely related to her renal scarring.   3 NON-GU:  Solitary pulmonary nodule - R91.1 51mm nodule in the right lung base. I will arrange a repeat CT Chest in 1 year.    PLAN:   Orders    Schedule  X-Rays: 1 Year - C.T. Chest Without Contrast  Return Visit/Planned Activity: Next Available Appointment - Schedule Surgery  Note: I will set her up for right PCNL.

## 2018-10-02 NOTE — Progress Notes (Signed)
Pt had stress test done today and final results in epic.  Awaiting confirmation for clearance from cardiologist.

## 2018-10-03 ENCOUNTER — Observation Stay (HOSPITAL_COMMUNITY): Payer: Medicare HMO | Admitting: Certified Registered Nurse Anesthetist

## 2018-10-03 ENCOUNTER — Observation Stay (HOSPITAL_COMMUNITY): Payer: Medicare HMO

## 2018-10-03 ENCOUNTER — Ambulatory Visit (HOSPITAL_COMMUNITY)
Admission: RE | Admit: 2018-10-03 | Discharge: 2018-10-03 | Disposition: A | Discharge status: Home / Self Care | Payer: Medicare HMO | Source: Ambulatory Visit | Attending: Urology | Admitting: Urology

## 2018-10-03 ENCOUNTER — Encounter (HOSPITAL_COMMUNITY): Payer: Self-pay | Admitting: *Deleted

## 2018-10-03 ENCOUNTER — Other Ambulatory Visit: Payer: Self-pay

## 2018-10-03 ENCOUNTER — Observation Stay (HOSPITAL_COMMUNITY)
Admission: RE | Admit: 2018-10-03 | Discharge: 2018-10-04 | Disposition: A | Discharge status: Home / Self Care | Payer: Medicare HMO | Source: Ambulatory Visit | Attending: Urology | Admitting: Urology

## 2018-10-03 ENCOUNTER — Encounter (HOSPITAL_COMMUNITY)
Admission: RE | Disposition: A | Discharge status: Home / Self Care | Payer: Self-pay | Source: Ambulatory Visit | Attending: Urology

## 2018-10-03 DIAGNOSIS — F17200 Nicotine dependence, unspecified, uncomplicated: Secondary | ICD-10-CM | POA: Insufficient documentation

## 2018-10-03 DIAGNOSIS — H409 Unspecified glaucoma: Secondary | ICD-10-CM | POA: Diagnosis not present

## 2018-10-03 DIAGNOSIS — Z7982 Long term (current) use of aspirin: Secondary | ICD-10-CM | POA: Diagnosis not present

## 2018-10-03 DIAGNOSIS — H5462 Unqualified visual loss, left eye, normal vision right eye: Secondary | ICD-10-CM | POA: Insufficient documentation

## 2018-10-03 DIAGNOSIS — F329 Major depressive disorder, single episode, unspecified: Secondary | ICD-10-CM | POA: Diagnosis not present

## 2018-10-03 DIAGNOSIS — I2510 Atherosclerotic heart disease of native coronary artery without angina pectoris: Secondary | ICD-10-CM | POA: Insufficient documentation

## 2018-10-03 DIAGNOSIS — M1990 Unspecified osteoarthritis, unspecified site: Secondary | ICD-10-CM | POA: Diagnosis not present

## 2018-10-03 DIAGNOSIS — Z79899 Other long term (current) drug therapy: Secondary | ICD-10-CM | POA: Insufficient documentation

## 2018-10-03 DIAGNOSIS — Z820 Family history of epilepsy and other diseases of the nervous system: Secondary | ICD-10-CM | POA: Diagnosis not present

## 2018-10-03 DIAGNOSIS — Z90710 Acquired absence of both cervix and uterus: Secondary | ICD-10-CM | POA: Diagnosis not present

## 2018-10-03 DIAGNOSIS — I10 Essential (primary) hypertension: Secondary | ICD-10-CM | POA: Insufficient documentation

## 2018-10-03 DIAGNOSIS — F419 Anxiety disorder, unspecified: Secondary | ICD-10-CM | POA: Insufficient documentation

## 2018-10-03 DIAGNOSIS — Z8673 Personal history of transient ischemic attack (TIA), and cerebral infarction without residual deficits: Secondary | ICD-10-CM | POA: Insufficient documentation

## 2018-10-03 DIAGNOSIS — R911 Solitary pulmonary nodule: Secondary | ICD-10-CM | POA: Insufficient documentation

## 2018-10-03 DIAGNOSIS — Z888 Allergy status to other drugs, medicaments and biological substances status: Secondary | ICD-10-CM | POA: Diagnosis not present

## 2018-10-03 DIAGNOSIS — K5900 Constipation, unspecified: Secondary | ICD-10-CM | POA: Insufficient documentation

## 2018-10-03 DIAGNOSIS — Z823 Family history of stroke: Secondary | ICD-10-CM | POA: Insufficient documentation

## 2018-10-03 DIAGNOSIS — N200 Calculus of kidney: Secondary | ICD-10-CM | POA: Diagnosis not present

## 2018-10-03 DIAGNOSIS — Z794 Long term (current) use of insulin: Secondary | ICD-10-CM | POA: Insufficient documentation

## 2018-10-03 DIAGNOSIS — Z8249 Family history of ischemic heart disease and other diseases of the circulatory system: Secondary | ICD-10-CM | POA: Insufficient documentation

## 2018-10-03 DIAGNOSIS — E785 Hyperlipidemia, unspecified: Secondary | ICD-10-CM | POA: Diagnosis not present

## 2018-10-03 DIAGNOSIS — Z955 Presence of coronary angioplasty implant and graft: Secondary | ICD-10-CM | POA: Diagnosis not present

## 2018-10-03 DIAGNOSIS — Z8711 Personal history of peptic ulcer disease: Secondary | ICD-10-CM | POA: Insufficient documentation

## 2018-10-03 DIAGNOSIS — R6889 Other general symptoms and signs: Secondary | ICD-10-CM | POA: Diagnosis not present

## 2018-10-03 DIAGNOSIS — E119 Type 2 diabetes mellitus without complications: Secondary | ICD-10-CM | POA: Insufficient documentation

## 2018-10-03 HISTORY — PX: IR URETERAL STENT RIGHT NEW ACCESS W/O SEP NEPHROSTOMY CATH: IMG6076

## 2018-10-03 HISTORY — PX: NEPHROLITHOTOMY: SHX5134

## 2018-10-03 LAB — BASIC METABOLIC PANEL
Anion gap: 9 (ref 5–15)
BUN: 9 mg/dL (ref 8–23)
CO2: 25 mmol/L (ref 22–32)
Calcium: 9.6 mg/dL (ref 8.9–10.3)
Chloride: 113 mmol/L — ABNORMAL HIGH (ref 98–111)
Creatinine, Ser: 0.93 mg/dL (ref 0.44–1.00)
GFR calc Af Amer: >60 mL/min (ref >60)
GFR calc non Af Amer: >60 mL/min (ref >60)
Glucose, Bld: 100 mg/dL — ABNORMAL HIGH (ref 70–99)
Potassium: 3.6 mmol/L (ref 3.5–5.1)
Sodium: 147 mmol/L — ABNORMAL HIGH (ref 135–145)

## 2018-10-03 LAB — CBC WITH DIFFERENTIAL/PLATELET
Basophils Absolute: 0 10*3/uL (ref 0.0–0.1)
Basophils Relative: 0 %
Eosinophils Absolute: 0.2 10*3/uL (ref 0.0–0.5)
Eosinophils Relative: 5 %
HCT: 36.9 % (ref 36.0–46.0)
Hemoglobin: 11.4 g/dL — ABNORMAL LOW (ref 12.0–15.0)
Lymphocytes Relative: 53 %
Lymphs Abs: 2.6 10*3/uL (ref 0.7–4.0)
MCH: 21.8 pg — ABNORMAL LOW (ref 26.0–34.0)
MCHC: 30.9 g/dL (ref 30.0–36.0)
MCV: 70.4 fL — ABNORMAL LOW (ref 80.0–100.0)
Monocytes Absolute: 0.3 10*3/uL (ref 0.1–1.0)
Monocytes Relative: 6 %
Neutro Abs: 1.8 10*3/uL (ref 1.7–7.7)
Neutrophils Relative %: 36 %
Platelets: 207 10*3/uL (ref 150–400)
RBC: 5.24 MIL/uL — ABNORMAL HIGH (ref 3.87–5.11)
RDW: 17.5 % — ABNORMAL HIGH (ref 11.5–15.5)
WBC: 5 10*3/uL (ref 4.0–10.5)
nRBC: 0 % (ref 0.0–0.2)

## 2018-10-03 LAB — PROTIME-INR
INR: 0.94
Prothrombin Time: 12.4 seconds (ref 11.4–15.2)

## 2018-10-03 LAB — GLUCOSE, CAPILLARY
Glucose-Capillary: 100 mg/dL — ABNORMAL HIGH (ref 70–99)
Glucose-Capillary: 100 mg/dL — ABNORMAL HIGH (ref 70–99)
Glucose-Capillary: 137 mg/dL — ABNORMAL HIGH (ref 70–99)
Glucose-Capillary: 113 mg/dL — ABNORMAL HIGH (ref 70–99)

## 2018-10-03 LAB — HEMOGLOBIN AND HEMATOCRIT, BLOOD
HCT: 39.6 % (ref 36.0–46.0)
Hemoglobin: 11.6 g/dL — ABNORMAL LOW (ref 12.0–15.0)

## 2018-10-03 LAB — TYPE AND SCREEN
ABO/RH(D): A POS
Antibody Screen: NEGATIVE

## 2018-10-03 SURGERY — NEPHROLITHOTOMY PERCUTANEOUS
Anesthesia: General | Laterality: Right

## 2018-10-03 MED ORDER — ZOLPIDEM TARTRATE 5 MG PO TABS
5.0000 mg | ORAL_TABLET | Freq: Every evening | ORAL | Status: DC | PRN
  Administered 2018-10-04: 5 mg via ORAL
  Filled 2018-10-03: qty 1

## 2018-10-03 MED ORDER — MENTHOL 3 MG MT LOZG
1.0000 | LOZENGE | OROMUCOSAL | Status: DC | PRN
  Filled 2018-10-03: qty 9

## 2018-10-03 MED ORDER — ROCURONIUM BROMIDE 100 MG/10ML IV SOLN
INTRAVENOUS | Status: DC | PRN
  Administered 2018-10-03: 50 mg via INTRAVENOUS

## 2018-10-03 MED ORDER — OXYCODONE HCL 5 MG/5ML PO SOLN
5.0000 mg | Freq: Once | ORAL | Status: DC | PRN
  Filled 2018-10-03: qty 5

## 2018-10-03 MED ORDER — FENTANYL CITRATE (PF) 250 MCG/5ML IJ SOLN
INTRAMUSCULAR | Status: AC
  Filled 2018-10-03: qty 5

## 2018-10-03 MED ORDER — PROMETHAZINE HCL 25 MG/ML IJ SOLN
INTRAMUSCULAR | Status: AC
  Filled 2018-10-03: qty 1

## 2018-10-03 MED ORDER — LIDOCAINE HCL 1 % IJ SOLN
INTRAMUSCULAR | Status: AC
  Filled 2018-10-03: qty 20

## 2018-10-03 MED ORDER — PROPOFOL 10 MG/ML IV BOLUS
INTRAVENOUS | Status: DC | PRN
  Administered 2018-10-03: 140 mg via INTRAVENOUS

## 2018-10-03 MED ORDER — SODIUM CHLORIDE 0.9 % IV SOLN: INTRAVENOUS | Status: DC

## 2018-10-03 MED ORDER — HYDROMORPHONE HCL 1 MG/ML IJ SOLN
0.5000 mg | INTRAMUSCULAR | Status: DC | PRN
  Administered 2018-10-03 – 2018-10-04 (×5): 1 mg via INTRAVENOUS
  Filled 2018-10-03 (×5): qty 1

## 2018-10-03 MED ORDER — ONDANSETRON HCL 4 MG/2ML IJ SOLN
4.0000 mg | Freq: Once | INTRAMUSCULAR | Status: AC | PRN
  Administered 2018-10-03: 4 mg via INTRAVENOUS

## 2018-10-03 MED ORDER — OXYCODONE HCL 5 MG PO TABS
5.0000 mg | ORAL_TABLET | ORAL | Status: DC | PRN
  Administered 2018-10-04: 5 mg via ORAL
  Filled 2018-10-03: qty 1

## 2018-10-03 MED ORDER — LOSARTAN POTASSIUM 50 MG PO TABS
100.0000 mg | ORAL_TABLET | Freq: Every morning | ORAL | Status: DC
  Administered 2018-10-04: 100 mg via ORAL
  Filled 2018-10-03: qty 2

## 2018-10-03 MED ORDER — IOPAMIDOL (ISOVUE-300) INJECTION 61%
INTRAVENOUS | Status: AC
  Filled 2018-10-03: qty 50

## 2018-10-03 MED ORDER — HYDRALAZINE HCL 20 MG/ML IJ SOLN
INTRAMUSCULAR | Status: AC
  Administered 2018-10-03: 5 mg via INTRAVENOUS
  Filled 2018-10-03: qty 1

## 2018-10-03 MED ORDER — FENTANYL CITRATE (PF) 100 MCG/2ML IJ SOLN
INTRAMUSCULAR | Status: DC | PRN
  Administered 2018-10-03: 100 ug via INTRAVENOUS
  Administered 2018-10-03: 50 ug via INTRAVENOUS

## 2018-10-03 MED ORDER — POTASSIUM CHLORIDE IN NACL 20-0.45 MEQ/L-% IV SOLN
INTRAVENOUS | Status: DC
  Administered 2018-10-03 – 2018-10-04 (×2): via INTRAVENOUS
  Filled 2018-10-03 (×3): qty 1000

## 2018-10-03 MED ORDER — INSULIN ASPART 100 UNIT/ML ~~LOC~~ SOLN
0.0000 [IU] | Freq: Three times a day (TID) | SUBCUTANEOUS | Status: DC
  Administered 2018-10-03: 2 [IU] via SUBCUTANEOUS

## 2018-10-03 MED ORDER — BUPIVACAINE HCL (PF) 0.25 % IJ SOLN
INTRAMUSCULAR | Status: AC
  Filled 2018-10-03: qty 30

## 2018-10-03 MED ORDER — MIDAZOLAM HCL 2 MG/2ML IJ SOLN
INTRAMUSCULAR | Status: AC
  Filled 2018-10-03: qty 4

## 2018-10-03 MED ORDER — CEFAZOLIN SODIUM-DEXTROSE 2-4 GM/100ML-% IV SOLN
2.0000 g | INTRAVENOUS | Status: AC
  Administered 2018-10-03: 2 g via INTRAVENOUS

## 2018-10-03 MED ORDER — LOPERAMIDE HCL 1 MG/7.5ML PO SUSP
4.0000 mg | Freq: Three times a day (TID) | ORAL | Status: DC | PRN
  Filled 2018-10-03: qty 30

## 2018-10-03 MED ORDER — EPHEDRINE 5 MG/ML INJ
INTRAVENOUS | Status: AC
  Filled 2018-10-03: qty 10

## 2018-10-03 MED ORDER — CEFAZOLIN SODIUM-DEXTROSE 2-4 GM/100ML-% IV SOLN
INTRAVENOUS | Status: AC
  Filled 2018-10-03: qty 100

## 2018-10-03 MED ORDER — HYOSCYAMINE SULFATE 0.125 MG SL SUBL
0.1250 mg | SUBLINGUAL_TABLET | SUBLINGUAL | Status: DC | PRN
  Filled 2018-10-03: qty 1

## 2018-10-03 MED ORDER — AMLODIPINE BESYLATE 5 MG PO TABS
5.0000 mg | ORAL_TABLET | Freq: Every morning | ORAL | Status: DC
  Administered 2018-10-03 – 2018-10-04 (×2): 5 mg via ORAL
  Filled 2018-10-03 (×2): qty 1

## 2018-10-03 MED ORDER — PROMETHAZINE HCL 25 MG/ML IJ SOLN
6.2500 mg | INTRAMUSCULAR | Status: AC | PRN
  Administered 2018-10-03: 6.25 mg via INTRAVENOUS

## 2018-10-03 MED ORDER — FLEET ENEMA 7-19 GM/118ML RE ENEM: 1.0000 | ENEMA | Freq: Once | RECTAL | Status: DC | PRN

## 2018-10-03 MED ORDER — OXYCODONE HCL 5 MG PO TABS: 5.0000 mg | ORAL_TABLET | Freq: Once | ORAL | Status: DC | PRN

## 2018-10-03 MED ORDER — DULOXETINE HCL 30 MG PO CPEP
30.0000 mg | ORAL_CAPSULE | Freq: Every morning | ORAL | Status: DC
  Administered 2018-10-04: 30 mg via ORAL
  Filled 2018-10-03: qty 1

## 2018-10-03 MED ORDER — LACTATED RINGERS IV SOLN
INTRAVENOUS | Status: DC
  Administered 2018-10-03 (×3): via INTRAVENOUS

## 2018-10-03 MED ORDER — MIRTAZAPINE 15 MG PO TABS
45.0000 mg | ORAL_TABLET | Freq: Every day | ORAL | Status: DC
  Administered 2018-10-03: 45 mg via ORAL
  Filled 2018-10-03: qty 3

## 2018-10-03 MED ORDER — SODIUM CHLORIDE 0.9% FLUSH: 5.0000 mL | Freq: Three times a day (TID) | INTRAVENOUS | Status: DC

## 2018-10-03 MED ORDER — FENTANYL CITRATE (PF) 100 MCG/2ML IJ SOLN
INTRAMUSCULAR | Status: AC | PRN
  Administered 2018-10-03 (×2): 50 ug via INTRAVENOUS

## 2018-10-03 MED ORDER — HYDRALAZINE HCL 20 MG/ML IJ SOLN
5.0000 mg | INTRAMUSCULAR | Status: AC | PRN
  Administered 2018-10-03 (×2): 5 mg via INTRAVENOUS

## 2018-10-03 MED ORDER — METOCLOPRAMIDE HCL 5 MG/ML IJ SOLN
5.0000 mg | Freq: Once | INTRAMUSCULAR | Status: AC
  Administered 2018-10-03: 5 mg via INTRAVENOUS

## 2018-10-03 MED ORDER — ACETAMINOPHEN 325 MG PO TABS: 650.0000 mg | ORAL_TABLET | ORAL | Status: DC | PRN

## 2018-10-03 MED ORDER — FENTANYL CITRATE (PF) 100 MCG/2ML IJ SOLN
INTRAMUSCULAR | Status: AC
  Administered 2018-10-03: 50 ug via INTRAVENOUS
  Filled 2018-10-03: qty 2

## 2018-10-03 MED ORDER — IOHEXOL 300 MG/ML  SOLN
INTRAMUSCULAR | Status: DC | PRN
  Administered 2018-10-03: 5 mL

## 2018-10-03 MED ORDER — SUGAMMADEX SODIUM 200 MG/2ML IV SOLN
INTRAVENOUS | Status: DC | PRN
  Administered 2018-10-03: 200 mg via INTRAVENOUS

## 2018-10-03 MED ORDER — EPHEDRINE SULFATE-NACL 50-0.9 MG/10ML-% IV SOSY
PREFILLED_SYRINGE | INTRAVENOUS | Status: DC | PRN
  Administered 2018-10-03: 15 mg via INTRAVENOUS

## 2018-10-03 MED ORDER — MOMETASONE FURO-FORMOTEROL FUM 200-5 MCG/ACT IN AERO
2.0000 | INHALATION_SPRAY | Freq: Two times a day (BID) | RESPIRATORY_TRACT | Status: DC
  Administered 2018-10-03 – 2018-10-04 (×2): 2 via RESPIRATORY_TRACT
  Filled 2018-10-03: qty 8.8

## 2018-10-03 MED ORDER — HYDROCODONE-ACETAMINOPHEN 5-325 MG PO TABS: 1.0000 | ORAL_TABLET | ORAL | Status: DC | PRN

## 2018-10-03 MED ORDER — BISACODYL 10 MG RE SUPP: 10.0000 mg | Freq: Every day | RECTAL | Status: DC | PRN

## 2018-10-03 MED ORDER — CEFAZOLIN SODIUM-DEXTROSE 2-4 GM/100ML-% IV SOLN
2.0000 g | INTRAVENOUS | Status: DC
  Filled 2018-10-03: qty 100

## 2018-10-03 MED ORDER — LABETALOL HCL 5 MG/ML IV SOLN
INTRAVENOUS | Status: AC
  Filled 2018-10-03: qty 4

## 2018-10-03 MED ORDER — DOCUSATE SODIUM 100 MG PO CAPS
100.0000 mg | ORAL_CAPSULE | Freq: Two times a day (BID) | ORAL | Status: DC
  Administered 2018-10-03 – 2018-10-04 (×2): 100 mg via ORAL
  Filled 2018-10-03 (×2): qty 1

## 2018-10-03 MED ORDER — FENTANYL CITRATE (PF) 100 MCG/2ML IJ SOLN
INTRAMUSCULAR | Status: AC
  Filled 2018-10-03: qty 2

## 2018-10-03 MED ORDER — ONDANSETRON HCL 4 MG/2ML IJ SOLN
INTRAMUSCULAR | Status: AC
  Filled 2018-10-03: qty 2

## 2018-10-03 MED ORDER — LIDOCAINE 2% (20 MG/ML) 5 ML SYRINGE
INTRAMUSCULAR | Status: DC | PRN
  Administered 2018-10-03: 80 mg via INTRAVENOUS

## 2018-10-03 MED ORDER — PROPOFOL 10 MG/ML IV BOLUS
INTRAVENOUS | Status: AC
  Filled 2018-10-03: qty 20

## 2018-10-03 MED ORDER — METOCLOPRAMIDE HCL 5 MG/ML IJ SOLN
INTRAMUSCULAR | Status: AC
  Administered 2018-10-03: 5 mg via INTRAVENOUS
  Filled 2018-10-03: qty 2

## 2018-10-03 MED ORDER — BUPIVACAINE HCL (PF) 0.25 % IJ SOLN
INTRAMUSCULAR | Status: DC | PRN
  Administered 2018-10-03: 10 mL

## 2018-10-03 MED ORDER — CARVEDILOL 12.5 MG PO TABS
12.5000 mg | ORAL_TABLET | Freq: Two times a day (BID) | ORAL | Status: DC
  Administered 2018-10-03 – 2018-10-04 (×2): 12.5 mg via ORAL
  Filled 2018-10-03 (×2): qty 1

## 2018-10-03 MED ORDER — SENNOSIDES-DOCUSATE SODIUM 8.6-50 MG PO TABS: 1.0000 | ORAL_TABLET | Freq: Every evening | ORAL | Status: DC | PRN

## 2018-10-03 MED ORDER — IOPAMIDOL (ISOVUE-300) INJECTION 61%
50.0000 mL | Freq: Once | INTRAVENOUS | Status: AC | PRN
  Administered 2018-10-03: 10 mL

## 2018-10-03 MED ORDER — FENTANYL CITRATE (PF) 100 MCG/2ML IJ SOLN
25.0000 ug | INTRAMUSCULAR | Status: DC | PRN
  Administered 2018-10-03: 25 ug via INTRAVENOUS
  Administered 2018-10-03: 50 ug via INTRAVENOUS

## 2018-10-03 MED ORDER — LABETALOL HCL 5 MG/ML IV SOLN
10.0000 mg | Freq: Once | INTRAVENOUS | Status: AC
  Administered 2018-10-03: 10 mg via INTRAVENOUS

## 2018-10-03 MED ORDER — ONDANSETRON HCL 4 MG/2ML IJ SOLN
4.0000 mg | INTRAMUSCULAR | Status: DC | PRN
  Administered 2018-10-03 – 2018-10-04 (×2): 4 mg via INTRAVENOUS
  Filled 2018-10-03 (×2): qty 2

## 2018-10-03 MED ORDER — OXYCODONE-ACETAMINOPHEN 5-325 MG PO TABS: 1.0000 | ORAL_TABLET | Freq: Four times a day (QID) | ORAL | 0 refills | Status: DC | PRN

## 2018-10-03 MED ORDER — PANTOPRAZOLE SODIUM 40 MG PO TBEC
40.0000 mg | DELAYED_RELEASE_TABLET | Freq: Every morning | ORAL | Status: DC
  Administered 2018-10-04: 40 mg via ORAL
  Filled 2018-10-03: qty 1

## 2018-10-03 MED ORDER — ROSUVASTATIN CALCIUM 10 MG PO TABS
10.0000 mg | ORAL_TABLET | Freq: Every day | ORAL | Status: DC
  Administered 2018-10-03 – 2018-10-04 (×2): 10 mg via ORAL
  Filled 2018-10-03 (×2): qty 1

## 2018-10-03 MED ORDER — CHLORTHALIDONE 25 MG PO TABS
12.5000 mg | ORAL_TABLET | Freq: Every day | ORAL | Status: DC
  Administered 2018-10-03 – 2018-10-04 (×2): 12.5 mg via ORAL
  Filled 2018-10-03 (×2): qty 0.5

## 2018-10-03 MED ORDER — HYDROMORPHONE HCL 1 MG/ML IJ SOLN
INTRAMUSCULAR | Status: AC
  Filled 2018-10-03: qty 1

## 2018-10-03 MED ORDER — SODIUM CHLORIDE 0.9 % IR SOLN
Status: DC | PRN
  Administered 2018-10-03: 6000 mL

## 2018-10-03 MED ORDER — SUGAMMADEX SODIUM 200 MG/2ML IV SOLN
INTRAVENOUS | Status: AC
  Filled 2018-10-03: qty 2

## 2018-10-03 MED ORDER — DORZOLAMIDE HCL-TIMOLOL MAL 2-0.5 % OP SOLN
1.0000 [drp] | Freq: Two times a day (BID) | OPHTHALMIC | Status: DC
  Administered 2018-10-03 – 2018-10-04 (×2): 1 [drp] via OPHTHALMIC
  Filled 2018-10-03: qty 10

## 2018-10-03 MED ORDER — HYDROMORPHONE HCL 1 MG/ML IJ SOLN
0.2500 mg | INTRAMUSCULAR | Status: DC | PRN
  Administered 2018-10-03 (×2): 0.5 mg via INTRAVENOUS

## 2018-10-03 MED ORDER — MIDAZOLAM HCL 2 MG/2ML IJ SOLN
INTRAMUSCULAR | Status: AC | PRN
  Administered 2018-10-03 (×2): 1 mg via INTRAVENOUS

## 2018-10-03 MED ORDER — ONDANSETRON HCL 4 MG/2ML IJ SOLN
INTRAMUSCULAR | Status: DC | PRN
  Administered 2018-10-03: 4 mg via INTRAVENOUS

## 2018-10-03 MED ORDER — PHENYLEPHRINE 40 MCG/ML (10ML) SYRINGE FOR IV PUSH (FOR BLOOD PRESSURE SUPPORT)
PREFILLED_SYRINGE | INTRAVENOUS | Status: DC | PRN
  Administered 2018-10-03 (×2): 80 ug via INTRAVENOUS

## 2018-10-03 MED ORDER — INFLUENZA VAC SPLIT QUAD 0.5 ML IM SUSY: 0.5000 mL | PREFILLED_SYRINGE | INTRAMUSCULAR | Status: DC

## 2018-10-03 MED ORDER — CEFAZOLIN SODIUM-DEXTROSE 1-4 GM/50ML-% IV SOLN
1.0000 g | Freq: Three times a day (TID) | INTRAVENOUS | Status: DC
  Administered 2018-10-03 – 2018-10-04 (×2): 1 g via INTRAVENOUS
  Filled 2018-10-03 (×4): qty 50

## 2018-10-03 MED ORDER — TRAZODONE HCL 100 MG PO TABS
300.0000 mg | ORAL_TABLET | Freq: Every day | ORAL | Status: DC
  Administered 2018-10-03: 300 mg via ORAL
  Filled 2018-10-03: qty 3

## 2018-10-03 SURGICAL SUPPLY — 39 items
BAG URINE DRAINAGE (UROLOGICAL SUPPLIES) IMPLANT
BASKET ZERO TIP NITINOL 2.4FR (BASKET) IMPLANT
BENZOIN TINCTURE PRP APPL 2/3 (GAUZE/BANDAGES/DRESSINGS) ×2 IMPLANT
BLADE SURG 15 STRL LF DISP TIS (BLADE) ×1 IMPLANT
BLADE SURG 15 STRL SS (BLADE) ×1
CATH AINSWORTH 30CC 24FR (CATHETERS) IMPLANT
CATH ROBINSON RED A/P 20FR (CATHETERS) IMPLANT
CATH URET 5FR 28IN OPEN ENDED (CATHETERS) IMPLANT
CATH URET DUAL LUMEN 6-10FR 50 (CATHETERS) ×2 IMPLANT
CATH UROLOGY TORQUE 40 (MISCELLANEOUS) ×2 IMPLANT
CATH X-FORCE N30 NEPHROSTOMY (TUBING) ×2 IMPLANT
COVER SURGICAL LIGHT HANDLE (MISCELLANEOUS) IMPLANT
DRAPE C-ARM 42X120 X-RAY (DRAPES) ×2 IMPLANT
DRAPE LINGEMAN PERC (DRAPES) ×2 IMPLANT
DRAPE SURG IRRIG POUCH 19X23 (DRAPES) ×2 IMPLANT
DRSG PAD ABDOMINAL 8X10 ST (GAUZE/BANDAGES/DRESSINGS) IMPLANT
DRSG TEGADERM 8X12 (GAUZE/BANDAGES/DRESSINGS) ×2 IMPLANT
EXTRACTOR STONE NITINOL NGAGE (UROLOGICAL SUPPLIES) ×2 IMPLANT
FIBER LASER FLEXIVA 365 (UROLOGICAL SUPPLIES) IMPLANT
FIBER LASER TRAC TIP (UROLOGICAL SUPPLIES) IMPLANT
GAUZE SPONGE 4X4 12PLY STRL (GAUZE/BANDAGES/DRESSINGS) ×2 IMPLANT
GLOVE SURG SS PI 8.0 STRL IVOR (GLOVE) ×2 IMPLANT
GOWN STRL REUS W/TWL XL LVL3 (GOWN DISPOSABLE) ×2 IMPLANT
GUIDEWIRE AMPLAZ .035X145 (WIRE) ×4 IMPLANT
KIT BASIN OR (CUSTOM PROCEDURE TRAY) ×2 IMPLANT
MANIFOLD NEPTUNE II (INSTRUMENTS) ×2 IMPLANT
NS IRRIG 1000ML POUR BTL (IV SOLUTION) IMPLANT
PACK CYSTO (CUSTOM PROCEDURE TRAY) ×2 IMPLANT
PAD ABD 8X10 STRL (GAUZE/BANDAGES/DRESSINGS) ×2 IMPLANT
SET IRRIG Y TYPE TUR BLADDER L (SET/KITS/TRAYS/PACK) IMPLANT
SPONGE LAP 4X18 RFD (DISPOSABLE) ×2 IMPLANT
SUT SILK 2 0 30  PSL (SUTURE) ×1
SUT SILK 2 0 30 PSL (SUTURE) ×1 IMPLANT
SYR 10ML LL (SYRINGE) ×2 IMPLANT
SYR 20CC LL (SYRINGE) ×4 IMPLANT
TOWEL OR 17X26 10 PK STRL BLUE (TOWEL DISPOSABLE) ×2 IMPLANT
TOWEL OR NON WOVEN STRL DISP B (DISPOSABLE) ×2 IMPLANT
TRAY FOLEY MTR SLVR 16FR STAT (SET/KITS/TRAYS/PACK) ×2 IMPLANT
TUBING CONNECTING 10 (TUBING) ×4 IMPLANT

## 2018-10-03 NOTE — Interval H&P Note (Signed)
History and Physical Interval Note:  Cardiac clearance received.   10/03/2018 11:03 AM  Susan Holmes  has presented today for surgery, with the diagnosis of RIGHT RENAL STONES  The various methods of treatment have been discussed with the patient and family. After consideration of risks, benefits and other options for treatment, the patient has consented to  Procedure(s): RIGHT NEPHROLITHOTOMY PERCUTANEOUS FIRST STAGE (Right) as a surgical intervention .  The patient's history has been reviewed, patient examined, no change in status, stable for surgery.  I have reviewed the patient's chart and labs.  Questions were answered to the patient's satisfaction.     Irine Seal

## 2018-10-03 NOTE — Procedures (Signed)
  Procedure: R antegrade nephroureteral catheter 46f pre op EBL:   minimal Complications:  none immediate  See full dictation in [facility].  Dillard Cannon MD Main # [phone] Pager  [phone]

## 2018-10-03 NOTE — H&P (Signed)
Chief Complaint: Patient was seen in consultation today for right renal calculi  Referring Physician(s): Wrenn,John  Supervising Physician: Arne Cleveland  Patient Status: [facility] - Out-pt  History of Present Illness: Susan Holmes is a 62 y.o. female with past medical history of anxiety, CAD s/p cardiac cath in 2015, DM2, HTN, stroke who has been followed by Urology for known kidney stones.  She has plans today for right renal stone extraction in the OR with Dr. Jeffie Pollock and presents to radiology for right percutaneous nephrostomy vs. Nephroureteral stent placement.   She presents today in her usual state of health.  She does endorse back/flank pain intermittently on the right.  She has been NPO.  She does not take blood thinners.   Past Medical History:  Diagnosis Date  . Anxiety   . Arthritis   . Chest pain 09/26/2018  . Congenital blindness    Left  eye  . Coronary artery disease    per cardiac cath 2015-- mild disease involving LAD and branches ,  LCFx and branches, and mild disease pRCA  . Depression   . Diabetes mellitus, type II ([place])    followed by pcp  . Glaucoma   . Glaucoma, both eyes   . Hepatitis B infection pt unsure   resolved  . History of transient ischemic attack (TIA) 2017   per pt no residuals  . HTN (hypertension)    followed by pcp  . Hyperlipidemia   . Poor dental hygiene   . Renal calculus, right   . Stroke ([place])   . Tobacco abuse    .5 PPD smoker    Past Surgical History:  Procedure Laterality Date  . ABDOMINAL HYSTERECTOMY  1980s   unsure if ovaries were taken  . GLAUCOMA SURGERY Right 2017  . LEFT HEART CATHETERIZATION WITH CORONARY ANGIOGRAM N/A 10/09/2014   Procedure: LEFT HEART CATHETERIZATION WITH CORONARY ANGIOGRAM;  Surgeon: Troy Sine, MD;  Location: [facility] CATH LAB;  Service: Cardiovascular;  Laterality: N/A;  . TEE WITHOUT CARDIOVERSION N/A 08/17/2016   Procedure: TRANSESOPHAGEAL ECHOCARDIOGRAM (TEE);  Surgeon: Sueanne Margarita, MD;  Location: [facility] ENDOSCOPY;  Service: Cardiovascular;  Laterality: N/A;    Allergies: Ace inhibitors; Chantix [varenicline]; and Lipitor [atorvastatin]  Medications: Prior to Admission medications   Medication Sig Start Date End Date Taking? Authorizing Provider  ACCU-CHEK FASTCLIX LANCETS MISC CHECK BLOOD SUGAR THREE TIMES DAILY AS DIRECTED 07/11/18   Mosetta Anis, MD  amLODipine (NORVASC) 10 MG tablet Take 1 tablet (10 mg total) by mouth daily. Patient taking differently: Take 5 mg by mouth every morning.  09/19/18   Masoudi, Dorthula Rue, MD  aspirin EC 81 MG tablet Take 81 mg by mouth daily.    [provider]  Blood Glucose Monitoring Suppl (ACCU-CHEK GUIDE) w/Device KIT 1 each by Does not apply route 3 (three) times daily. 08/05/17   Shela Leff, MD  budesonide-formoterol (SYMBICORT) 160-4.5 MCG/ACT inhaler Inhale 2 puffs into the lungs 2 (two) times daily. Patient taking differently: Inhale 2 puffs into the lungs 2 (two) times daily as needed.  06/20/18   Shela Leff, MD  carvedilol (COREG) 12.5 MG tablet TAKE 1 TABLET(12.5 MG) BY MOUTH TWICE DAILY Patient taking differently: Take 12.5 mg by mouth 2 (two) times daily with a meal.  07/11/18   Mosetta Anis, MD  chlorthalidone (HYGROTON) 25 MG tablet 1/2 TABLET BY MOUTH DAILY 09/29/18   Skeet Latch, MD  docusate sodium (COLACE) 100 MG capsule Take 1 capsule (100  mg total) by mouth 2 (two) times daily. 01/04/17   Rice, Resa Miner, MD  dorzolamide-timolol (COSOPT) 22.3-6.8 MG/ML ophthalmic solution Place 1 drop into both eyes 2 (two) times daily.     [provider]  Dulaglutide (TRULICITY) 7.34 KA/7.6OT SOPN INJECT 0.75 MG UNDER THE SKIN ONCE A WEEK( DISCONTINUE BYETTA) 07/11/18   Mosetta Anis, MD  DULoxetine (CYMBALTA) 30 MG capsule TAKE 1 CAPSULE(30 MG) BY MOUTH DAILY Patient taking differently: Take 30 mg by mouth every morning.  07/11/18   Mosetta Anis, MD  glucose blood (ONE TOUCH ULTRA  TEST) test strip USE TO TEST BLOOD GLUCOSE 3 TIMES A DAY. Diagnosis code: E11.41 07/11/18   Mosetta Anis, MD  Insulin Glargine (LANTUS SOLOSTAR) 100 UNIT/ML Solostar Pen Inject 32 Units into the skin at bedtime. Patient taking differently: Inject 32 Units into the skin at bedtime.  09/26/18   Kathi Ludwig, MD  Insulin Pen Needle (VALUMARK PEN NEEDLES) 31G X 8 MM MISC 1 each by Does not apply route daily. 11/20/14   Francesca Oman, DO  Loperamide HCl 1 MG/7.5ML LIQD Take 30 mLs (4 mg total) by mouth 3 (three) times daily as needed. 07/06/18   Katherine Roan, MD  losartan (COZAAR) 100 MG tablet Take 1 tablet (100 mg total) by mouth daily. Patient taking differently: Take 100 mg by mouth every morning.  07/11/18 07/11/19  Mosetta Anis, MD  mirtazapine (REMERON) 45 MG tablet Take 45 mg by mouth at bedtime.    [provider]  Multiple Vitamins-Minerals (CENTRUM SILVER PO) Take 1 tablet by mouth daily.    [provider]  pantoprazole (PROTONIX) 40 MG tablet TAKE 1 TABLET BY MOUTH DAILY Patient taking differently: Take 40 mg by mouth every morning.  12/07/17   Shela Leff, MD  rosuvastatin (CRESTOR) 10 MG tablet Take 1 tablet (10 mg total) by mouth daily. 09/28/18   Kathi Ludwig, MD  traZODone (DESYREL) 100 MG tablet Take 300 mg by mouth at bedtime.  01/29/17   [provider]     Family History  Problem Relation Age of Onset  . Heart disease Mother 73  . Hypertension Mother   . CVA Mother   . Heart disease Father 49  . CVA Sister   . CVA Brother   . Aneurysm Brother   . Aneurysm Sister   . CVA Maternal Grandmother   . CVA Maternal Grandfather   . Colon cancer Neg Hx   . Esophageal cancer Neg Hx   . Stomach cancer Neg Hx     Social History   Socioeconomic History  . Marital status: Divorced    Spouse name: Not on file  . Number of children: 1  . Years of education: 82  . Highest education level: Not on file  Occupational History     Employer: UNEMPLOYED  Social Needs  . Financial resource strain: Not on file  . Food insecurity:    Worry: Not on file    Inability: Not on file  . Transportation needs:    Medical: Not on file    Non-medical: Not on file  Tobacco Use  . Smoking status: Current Every Day Smoker    Packs/day: 0.45    Years: 20.00    Pack years: 9.00    Types: Cigarettes  . Smokeless tobacco: Current User    Types: Chew  Substance and Sexual Activity  . Alcohol use: No    Alcohol/week: 0.0 standard drinks  . Drug  use: No  . Sexual activity: Not Currently    Birth control/protection: Surgical  Lifestyle  . Physical activity:    Days per week: Not on file    Minutes per session: Not on file  . Stress: Not on file  Relationships  . Social connections:    Talks on phone: Not on file    Gets together: Not on file    Attends religious service: Not on file    Active member of club or organization: Not on file    Attends meetings of clubs or organizations: Not on file    Relationship status: Not on file  Other Topics Concern  . Not on file  Social History Narrative  . Not on file    Review of Systems: A 12 point ROS discussed and pertinent positives are indicated in the HPI above.  All other systems are negative.  Review of Systems  Constitutional: Negative for fatigue and fever.  Respiratory: Negative for cough and shortness of breath.   Cardiovascular: Negative for chest pain.  Gastrointestinal: Negative for abdominal pain, constipation, diarrhea, nausea and vomiting.  Genitourinary: Negative for dysuria and hematuria.  Musculoskeletal: Positive for back pain.  Psychiatric/Behavioral: Negative for behavioral problems and confusion.    Vital Signs: BP (!) 169/85   Pulse (!) 52   Temp 97.6 F (36.4 C) (Oral)   Resp 16   SpO2 96%   Physical Exam  Constitutional: She is oriented to person, place, and time. She appears well-developed. No distress.  Cardiovascular: Normal rate and  regular rhythm. Exam reveals no gallop and no friction rub.  No murmur heard. Pulmonary/Chest: Effort normal and breath sounds normal. No respiratory distress.  Abdominal: Soft. She exhibits no distension.  Neurological: She is alert and oriented to person, place, and time.  Skin: Skin is warm and dry. She is not diaphoretic.  Psychiatric: She has a normal mood and affect. Her behavior is normal. Judgment and thought content normal.  Nursing note and vitals reviewed.    MD Evaluation Airway: WNL Heart: WNL Abdomen: WNL Chest/ Lungs: WNL ASA  Classification: 2 Mallampati/Airway Score: Two   Imaging: No results found.  Labs:  CBC: Recent Labs    11/23/17 1116 06/13/18 1634 09/28/18 1026  WBC 9.4 7.8 5.6  HGB 13.4 12.5 12.3  HCT 41.6 39.7 39.5  PLT 339 324 210    COAGS: No results for input(s): INR, APTT in the last 8760 hours.  BMP: Recent Labs    11/24/17 1040 12/02/17 1103 06/13/18 1634 09/28/18 1026  NA 137 139 142 145  K 3.6 4.4 4.0 4.1  CL 105 102 108* 109  CO2 21* '23 22 24  ' GLUCOSE 155* 166* 77 163*  BUN '14 10 9 9  ' CALCIUM 10.3 10.2 10.0  10.0 9.7  CREATININE 1.29* 0.96 1.14* 0.98  GFRNONAA 43* 64 51* >60  GFRAA 50* 73 59* >60    LIVER FUNCTION TESTS: Recent Labs    11/23/17 1116 06/13/18 1634  BILITOT 0.2 0.3  AST 21 20  ALT 16 18  ALKPHOS 89 103  PROT 7.3 6.9  ALBUMIN 4.6 4.2    TUMOR MARKERS: No results for input(s): AFPTM, CEA, CA199, CHROMGRNA in the last 8760 hours.  Assessment and Plan: Patient with past medical history of DM, HTN, kidney stones presents with complaint of flank/back pain, plans for stone retrieval in the OR today.  IR consulted for percutaneous nephrostomy vs. Nephroureteral stent placement at the request of Dr. Jeffie Pollock. Case reviewed  by Dr. Vernard Gambles who approves patient for procedure.  Patient presents today in their usual state of health.  She has been NPO and is not currently on blood thinners.   Risks and  benefits were discussed with the patient including, but not limited to, infection, bleeding, significant bleeding causing loss or decrease in renal function or damage to adjacent structures.   All of the patient's questions were answered, patient is agreeable to proceed.  Consent signed and in chart.  Thank you for this interesting consult.  I greatly enjoyed meeting LEYLI KEVORKIAN and look forward to participating in their care.  A copy of this report was sent to the requesting provider on this date.  Electronically Signed: Docia Barrier, PA 10/03/2018, 9:02 AM   I spent a total of  30 Minutes   in face to face in clinical consultation, greater than 50% of which was counseling/coordinating care for right renal calculi

## 2018-10-03 NOTE — Anesthesia Procedure Notes (Signed)
Procedure Name: Intubation Date/Time: 10/03/2018 11:48 AM Performed by: [place] Sears Oran C, CRNA Pre-anesthesia Checklist: Patient identified, Emergency Drugs available, Suction available and Patient being monitored Patient Re-evaluated:Patient Re-evaluated prior to induction Oxygen Delivery Method: Circle system utilized Preoxygenation: Pre-oxygenation with 100% oxygen Induction Type: IV induction Ventilation: Mask ventilation without difficulty Laryngoscope Size: Mac and 3 Grade View: Grade I Tube type: Oral Tube size: 7.0 mm Number of attempts: 1 Airway Equipment and Method: Stylet and Oral airway Placement Confirmation: ETT inserted through vocal cords under direct vision,  positive ETCO2 and breath sounds checked- equal and bilateral Tube secured with: Tape Dental Injury: Teeth and Oropharynx as per pre-operative assessment

## 2018-10-03 NOTE — Discharge Instructions (Addendum)
Percutaneous Nephrolithotomy, Care After Refer to this sheet in the next few weeks. These instructions provide you with information about caring for yourself after your procedure. Your health care provider may also give you more specific instructions. Your treatment has been planned according to current medical practices, but problems sometimes occur. Call your health care provider if you have any problems or questions after your procedure. What can I expect after the procedure? After the procedure, it is common to have:  Soreness or pain.  Fatigue.  Some blood in your urine for a few days.  Follow these instructions at home: Incision care   Follow instructions from your health care provider about how to take care of your cut from surgery (incision). Make sure you: ? Wash your hands with soap and water before you change your bandage (dressing). If soap and water are not available, use hand sanitizer. ? Change your dressing as told by your health care provider. ? Leave stitches (sutures), skin glue, or adhesive strips in place. These skin closures may need to stay in place for 2 weeks or longer. If adhesive strip edges start to loosen and curl up, you may trim the loose edges. Do not remove adhesive strips completely unless your health care provider tells you to do that.  Check your incision area every day for signs of infection. Check for: ? More redness, swelling, or pain. ? More fluid or blood. ? Warmth. ? Pus or a bad smell. Activity  Avoid strenuous activities for as long as told by your health care provider.  Return to your normal activities as told by your health care provider. Ask your health care provider what activities are safe for you. General instructions  Take over-the-counter and prescription medicines only as told by your health care provider.  Do not drive or operate heavy machinery while taking prescription pain medicine.  Keep all follow-up visits as told by your  health care provider. This is important.  You may have been sent home with a catheter or kidney drain tube. If so, carefully follow your health care providers instructions on how to take care of your catheter or kidney drain tube. Contact a health care provider if:  You have a fever.  You have more redness, swelling, or pain around your incision.  You have more fluid or blood coming from your incision.  Your incision feels warm to the touch.  You have pus or a bad smell coming from your incision.  You lose your appetite.  You feel nauseous or you vomit. Get help right away if:  You have blood clots in your urine.  You cannot urinate.  You have chest pain or trouble breathing.  You can resume your aspirin on 10/05/18.  I will have the office contact you to set up a f/u visit for 10/11 to remove the tubes.   This information is not intended to replace advice given to you by your health care provider. Make sure you discuss any questions you have with your health care provider. Document Released: 01/09/2016 Document Revised: 05/20/2016 Document Reviewed: 06/02/2015 Elsevier Interactive Patient Education  2018 [employer].

## 2018-10-03 NOTE — Transfer of Care (Signed)
Immediate Anesthesia Transfer of Care Note  Patient: Susan Holmes  Procedure(s) Performed: RIGHT NEPHROLITHOTOMY PERCUTANEOUS FIRST STAGE (Right )  Patient Location: PACU  Anesthesia Type:General  Level of Consciousness: awake, alert  and oriented  Airway & Oxygen Therapy: Patient Spontanous Breathing and Patient connected to face mask oxygen  Post-op Assessment: Report given to RN and Post -op Vital signs reviewed and stable  Post vital signs: Reviewed and stable  Last Vitals:  Vitals Value Taken Time  BP 151/87 10/03/2018  1:15 PM  Temp    Pulse 70 10/03/2018  1:19 PM  Resp 14 10/03/2018  1:18 PM  SpO2 97 % 10/03/2018  1:19 PM  Vitals shown include unvalidated device data.  Last Pain:  Vitals:   10/03/18 1058  TempSrc: Oral         Complications: No apparent anesthesia complications

## 2018-10-03 NOTE — Op Note (Addendum)
Procedure: 1.  First stage right percutaneous nephrolithotomy for greater than 2 cm stone burden. 2.  Antegrade nephrostogram through existing tract with interpretation.  Preop diagnosis: Multiple right renal stones with a total length of greater than 2 cm.  Postop diagnosis: Same.  Surgeon: Dr. Irine Seal.  Anesthesia: General.  Specimen: Stone fragments.  Drains: 1.  Foley catheter. 2.  28 [place] council right nephrostomy tube area 3.  6 [place] nephro view safety catheter.  EBL: Approximately 50 mL.  Complications: None.  Indications: Susan Holmes is a 62 year old African-American female with right renal stones who is elected percutaneous nephrolithotomy for management.  She has approximately 15 mm right upper pole stone with additional smaller stones in the upper mid and lower pole with a total length of greater than 2 cm.  Procedure: She was taken to interventional radiology earlier today were successful upper pole access was obtained.  She had been given Ancef for that procedure.  She was then taken to the operating room where general anesthetic was induced on the holding room stretcher.  A Foley catheter was inserted using sterile technique and she was fitted with PAS hose.  She was then rolled prone on chest rolls with great care taken to pad all pressure points.  Her nephrostomy access site was then prepped with Betadine solution and she was draped in usual sterile fashion.  An Amplatz superstiff wire was passed through the access catheter into the bladder and the access catheter was removed.  The access incision was then lengthened to 2 cm with a knife.  The 8 French dual-lumen catheter was passed over the wire into the distal ureter where a second Amplatz wire was passed into the bladder.  The nephrostomy balloon catheter was then passed over the wire and inflated to 20 atm under fluoroscopic guidance with the tip in the renal pelvis.  The sheath was then advanced over the balloon under  fluoroscopic guidance.  The balloon was deflated and removed.  The rigid nephroscope was then passed through the sheath into the renal pelvis and then backed out until the largest stone was identified.  It was grasped with a 2 jaw grasper and removed in approximately 3 pieces.  I then used the flexible nephroscope to remove additional stones from the upper pole calyces in the midpole calyx.  Finally the flexible digital ureteroscope was used to remove a lower pole stone that was in a calyx with a narrowed infundibular.  An engage basket had been used through the flexible nephroscope the ureteroscope.  Final fluoroscopy revealed a possible small fragment in the lower pole however was unable to identify the infundibulum to the calyx to access the stone it was felt this possibly could be Randall's plaque.  There was some mucosal splitting with visible fat at the junction of the upper pole infundibulum in the renal pelvis.  A short Nephro-Vite catheter was passed over the safety wire into the distal ureter at the bladder and the wire was removed.  A 20 [place] council catheter was advanced over the working wire through the sheath into the renal pelvis and the balloon was filled with 2 mL of sterile water.  Contrast was instilled.  The antegrade nephrostogram revealed good antegrade flow and proper positioning of the nephrostomy catheter.  There was some minor extravasation near the renal pelvis.  The sheath was then backed the remainder the way out and cut away from the catheter and the catheter was secured to the skin with a 2-0 silk  suture the safety catheter was also secured with the 2-0 silk suture.  The working wire was removed and a final nephrostogram was performed which confirmed the findings noted above.  69m of 0.25% Marcaine was injected around the access site.   Wound was cleansed and a dressing was applied.  The safety catheter been And the nephrostomy tube is in place to straight drainage.  She was  then rolled supine on the recovery room stretcher, her anesthetic was reversed and she was moved to recovery room in stable condition.  There were no complications.  Stone fragments will be sent for analysis.

## 2018-10-04 ENCOUNTER — Encounter (HOSPITAL_COMMUNITY): Payer: Self-pay | Admitting: Urology

## 2018-10-04 ENCOUNTER — Observation Stay (HOSPITAL_COMMUNITY): Payer: Medicare HMO

## 2018-10-04 ENCOUNTER — Ambulatory Visit: Payer: Medicare HMO | Admitting: Podiatry

## 2018-10-04 DIAGNOSIS — H409 Unspecified glaucoma: Secondary | ICD-10-CM | POA: Diagnosis not present

## 2018-10-04 DIAGNOSIS — H5462 Unqualified visual loss, left eye, normal vision right eye: Secondary | ICD-10-CM | POA: Diagnosis not present

## 2018-10-04 DIAGNOSIS — E785 Hyperlipidemia, unspecified: Secondary | ICD-10-CM | POA: Diagnosis not present

## 2018-10-04 DIAGNOSIS — N200 Calculus of kidney: Secondary | ICD-10-CM | POA: Diagnosis not present

## 2018-10-04 DIAGNOSIS — E119 Type 2 diabetes mellitus without complications: Secondary | ICD-10-CM | POA: Diagnosis not present

## 2018-10-04 DIAGNOSIS — F17200 Nicotine dependence, unspecified, uncomplicated: Secondary | ICD-10-CM | POA: Diagnosis not present

## 2018-10-04 DIAGNOSIS — K5900 Constipation, unspecified: Secondary | ICD-10-CM | POA: Diagnosis not present

## 2018-10-04 DIAGNOSIS — R911 Solitary pulmonary nodule: Secondary | ICD-10-CM | POA: Diagnosis not present

## 2018-10-04 DIAGNOSIS — I10 Essential (primary) hypertension: Secondary | ICD-10-CM | POA: Diagnosis not present

## 2018-10-04 LAB — HEMOGLOBIN AND HEMATOCRIT, BLOOD
HCT: 41.4 % (ref 36.0–46.0)
Hemoglobin: 12.3 g/dL (ref 12.0–15.0)

## 2018-10-04 LAB — BASIC METABOLIC PANEL
Anion gap: 11 (ref 5–15)
BUN: 7 mg/dL — ABNORMAL LOW (ref 8–23)
CO2: 28 mmol/L (ref 22–32)
Calcium: 9.6 mg/dL (ref 8.9–10.3)
Chloride: 100 mmol/L (ref 98–111)
Creatinine, Ser: 0.84 mg/dL (ref 0.44–1.00)
GFR calc Af Amer: >60 mL/min (ref >60)
GFR calc non Af Amer: >60 mL/min (ref >60)
Glucose, Bld: 140 mg/dL — ABNORMAL HIGH (ref 70–99)
Potassium: 3.5 mmol/L (ref 3.5–5.1)
Sodium: 139 mmol/L (ref 135–145)

## 2018-10-04 LAB — GLUCOSE, CAPILLARY: Glucose-Capillary: 254 mg/dL — ABNORMAL HIGH (ref 70–99)

## 2018-10-04 MED ORDER — DIPHENHYDRAMINE HCL 25 MG PO CAPS
25.0000 mg | ORAL_CAPSULE | Freq: Once | ORAL | Status: AC
  Administered 2018-10-04: 25 mg via ORAL
  Filled 2018-10-04: qty 1

## 2018-10-04 NOTE — Anesthesia Postprocedure Evaluation (Signed)
Anesthesia Post Note  Patient: Susan Holmes  Procedure(s) Performed: RIGHT NEPHROLITHOTOMY PERCUTANEOUS FIRST STAGE (Right )     Patient location during evaluation: PACU Anesthesia Type: General Level of consciousness: awake and alert Pain management: pain level controlled Vital Signs Assessment: post-procedure vital signs reviewed and stable Respiratory status: spontaneous breathing, nonlabored ventilation, respiratory function stable and patient connected to nasal cannula oxygen Cardiovascular status: blood pressure returned to baseline and stable Postop Assessment: no apparent nausea or vomiting Anesthetic complications: no    Last Vitals:  Vitals:   10/03/18 2100 10/03/18 2114  BP:  (!) 162/82  Pulse:  63  Resp:  16  Temp:  37 C  SpO2: 98% 97%    Last Pain:  Vitals:   10/04/18 0342  TempSrc:   PainSc: 4                  Sabriah Hobbins COKER

## 2018-10-04 NOTE — Progress Notes (Signed)
Pt discharged from the unit via wheelchair. Discharge instructions were reviewed with the pt. npehrostomy tube dressing was replaced and tube was capped for discharge. Informed pt to follow up with [facility] 10/11 to have tube removed. No questions or concerns at this time.

## 2018-10-04 NOTE — Assessment & Plan Note (Signed)
See Chest pressure

## 2018-10-04 NOTE — Anesthesia Preprocedure Evaluation (Signed)
Anesthesia Evaluation  Patient identified by MRN, date of birth, ID band Patient awake    Reviewed: Allergy & Precautions, NPO status , Patient's Chart, lab work & pertinent test results  Airway Mallampati: II  TM Distance: >3 FB Neck ROM: Full    Dental  (+) Poor Dentition, Partial Lower, Partial Upper   Pulmonary Current Smoker,    breath sounds clear to auscultation       Cardiovascular hypertension,  Rhythm:Regular Rate:Normal     Neuro/Psych    GI/Hepatic   Endo/Other  diabetes  Renal/GU      Musculoskeletal   Abdominal   Peds  Hematology   Anesthesia Other Findings   Reproductive/Obstetrics                             Anesthesia Physical Anesthesia Plan  ASA: III  Anesthesia Plan: General   Post-op Pain Management:    Induction: Intravenous  PONV Risk Score and Plan: Ondansetron  Airway Management Planned: Oral ETT  Additional Equipment:   Intra-op Plan:   Post-operative Plan: Extubation in OR  Informed Consent: I have reviewed the patients History and Physical, chart, labs and discussed the procedure including the risks, benefits and alternatives for the proposed anesthesia with the patient or authorized representative who has indicated his/her understanding and acceptance.   Dental advisory given  Plan Discussed with: CRNA and Anesthesiologist  Anesthesia Plan Comments:         Anesthesia Quick Evaluation

## 2018-10-04 NOTE — Discharge Summary (Signed)
Physician Discharge Summary  Patient ID: Susan Holmes MRN: [id number] DOB/AGE: 03/08/1956 62 y.o.  Admit date: 10/03/2018 Discharge date: 10/04/2018  Admission Diagnoses:  Nephrolithiasis  Discharge Diagnoses:  Principal Problem:   Nephrolithiasis   Past Medical History:  Diagnosis Date  . Anxiety   . Arthritis   . Chest pain 09/26/2018  . Congenital blindness    Left  eye  . Coronary artery disease    per cardiac cath 2015-- mild disease involving LAD and branches ,  LCFx and branches, and mild disease pRCA  . Depression   . Diabetes mellitus, type II ([place])    followed by pcp  . Glaucoma   . Glaucoma, both eyes   . Hepatitis B infection pt unsure   resolved  . History of transient ischemic attack (TIA) 2017   per pt no residuals  . HTN (hypertension)    followed by pcp  . Hyperlipidemia   . Poor dental hygiene   . Renal calculus, right   . Stroke ([place])   . Tobacco abuse    .5 PPD smoker    Surgeries: Procedure(s): RIGHT NEPHROLITHOTOMY PERCUTANEOUS FIRST STAGE on 10/03/2018   Consultants (if any):   Discharged Condition: Improved  Hospital Course: SHERIAN VALENZA is an 62 y.o. female who was admitted 10/03/2018 with a diagnosis of Nephrolithiasis and went to the operating room on 10/03/2018 and underwent the above named procedures.  She did well post operatively and her Hgb has remained stable.   A KUB this AM showed only a single 2.21m RLP fragment which doesn't require a second look.   I will discharge her home this morning after a dressing change and will have her return on Friday for removal of the nephrostomy tube.    She was given perioperative antibiotics:  Anti-infectives (From admission, onward)   Start     Dose/Rate Route Frequency Ordered Stop   10/03/18 1800  ceFAZolin (ANCEF) IVPB 1 g/50 mL premix     1 g 100 mL/hr over 30 Minutes Intravenous Every 8 hours 10/03/18 1539     10/03/18 0830  ceFAZolin (ANCEF) IVPB 2g/100 mL premix     2 g 200 mL/hr  over 30 Minutes Intravenous To Radiology 10/03/18 0806 10/03/18 1049   10/03/18 0807  ceFAZolin (ANCEF) IVPB 2g/100 mL premix  Status:  Discontinued     2 g 200 mL/hr over 30 Minutes Intravenous 30 min pre-op 10/03/18 0807 10/03/18 1513    .  She was given sequential compression devices for DVT prophylaxis.  She benefited maximally from the hospital stay and there were no complications.    Recent vital signs:  Vitals:   10/03/18 2100 10/03/18 2114  BP:  (!) 162/82  Pulse:  63  Resp:  16  Temp:  98.6 F (37 C)  SpO2: 98% 97%    Recent laboratory studies:  Lab Results  Component Value Date   HGB 12.3 10/04/2018   HGB 11.6 (L) 10/03/2018   HGB 11.4 (L) 10/03/2018   Lab Results  Component Value Date   WBC 5.0 10/03/2018   PLT 207 10/03/2018   Lab Results  Component Value Date   INR 0.94 10/03/2018   Lab Results  Component Value Date   NA 139 10/04/2018   K 3.5 10/04/2018   CL 100 10/04/2018   CO2 28 10/04/2018   BUN 7 (L) 10/04/2018   CREATININE 0.84 10/04/2018   GLUCOSE 140 (H) 10/04/2018    Discharge Medications:   Allergies as  of 10/04/2018      Reactions   Ace Inhibitors Cough   Chantix [varenicline] Nausea And Vomiting   Lipitor [atorvastatin] Other (See Comments)   MYALGIA      Medication List    TAKE these medications   ACCU-CHEK FASTCLIX LANCETS Misc CHECK BLOOD SUGAR THREE TIMES DAILY AS DIRECTED   ACCU-CHEK GUIDE w/Device Kit 1 each by Does not apply route 3 (three) times daily.   amLODipine 10 MG tablet Commonly known as:  NORVASC Take 1 tablet (10 mg total) by mouth daily. What changed:    how much to take  when to take this   aspirin EC 81 MG tablet Take 81 mg by mouth daily.   budesonide-formoterol 160-4.5 MCG/ACT inhaler Commonly known as:  SYMBICORT Inhale 2 puffs into the lungs 2 (two) times daily. What changed:    when to take this  reasons to take this   carvedilol 12.5 MG tablet Commonly known as:  COREG TAKE 1  TABLET(12.5 MG) BY MOUTH TWICE DAILY What changed:    how much to take  how to take this  when to take this  additional instructions   CENTRUM SILVER PO Take 1 tablet by mouth daily.   chlorthalidone 25 MG tablet Commonly known as:  HYGROTON 1/2 TABLET BY MOUTH DAILY   docusate sodium 100 MG capsule Commonly known as:  COLACE Take 1 capsule (100 mg total) by mouth 2 (two) times daily.   dorzolamide-timolol 22.3-6.8 MG/ML ophthalmic solution Commonly known as:  COSOPT Place 1 drop into both eyes 2 (two) times daily.   Dulaglutide 0.75 MG/0.5ML Sopn INJECT 0.75 MG UNDER THE SKIN ONCE A WEEK( DISCONTINUE BYETTA)   DULoxetine 30 MG capsule Commonly known as:  CYMBALTA TAKE 1 CAPSULE(30 MG) BY MOUTH DAILY What changed:    how much to take  how to take this  when to take this  additional instructions   glucose blood test strip USE TO TEST BLOOD GLUCOSE 3 TIMES A DAY. Diagnosis code: E11.41   Insulin Glargine 100 UNIT/ML Solostar Pen Commonly known as:  LANTUS Inject 32 Units into the skin at bedtime.   Insulin Pen Needle 31G X 8 MM Misc 1 each by Does not apply route daily.   Loperamide HCl 1 MG/7.5ML Liqd Take 30 mLs (4 mg total) by mouth 3 (three) times daily as needed.   losartan 100 MG tablet Commonly known as:  COZAAR Take 1 tablet (100 mg total) by mouth daily. What changed:  when to take this   mirtazapine 45 MG tablet Commonly known as:  REMERON Take 45 mg by mouth at bedtime.   oxyCODONE-acetaminophen 5-325 MG tablet Commonly known as:  PERCOCET/ROXICET Take 1 tablet by mouth every 6 (six) hours as needed for severe pain.   pantoprazole 40 MG tablet Commonly known as:  PROTONIX TAKE 1 TABLET BY MOUTH DAILY What changed:  when to take this   rosuvastatin 10 MG tablet Commonly known as:  CRESTOR Take 1 tablet (10 mg total) by mouth daily.   traZODone 100 MG tablet Commonly known as:  DESYREL Take 300 mg by mouth at bedtime.        Diagnostic Studies: Dg Abd 1 View  Result Date: 10/04/2018 CLINICAL DATA:  Nephrolithiasis EXAM: ABDOMEN - 1 VIEW COMPARISON:  Fluoroscopy 10/03/2018 FINDINGS: Drainage catheter projected over the right abdomen probably representing a percutaneous nephrostomy tube. Right ureteral stent. Small stones projected over the lower pole right kidney. Bowel gas pattern is normal. Degenerative  changes in the spine. Vascular calcifications. IMPRESSION: Right percutaneous nephrostomy tube with right ureteral stent. Small stones projected over the lower pole right kidney. Electronically Signed   By: Lucienne Capers M.D.   On: 10/04/2018 05:42   Dg C-arm 1-60 Min-no Report  Result Date: 10/03/2018 Fluoroscopy was utilized by the requesting physician.  No radiographic interpretation.   Ir Ureteral Stent Right New Access W/o Sep Nephrostomy Cath  Result Date: 10/03/2018 CLINICAL DATA:  Symptomatic right nephrolithiasis, planned percutaneous nephrolithotomy EXAM: RIGHT PERCUTANEOUS NEPHROURETERAL CATHETER PLACEMENT UNDER FLUOROSCOPIC GUIDANCE FLUOROSCOPY TIME:  18 seconds; 40 mGy TECHNIQUE: The procedure, risks (including but not limited to bleeding, infection, organ damage ), benefits, and alternatives were explained to the patient. Questions regarding the procedure were encouraged and answered. The patient understands and consents to the procedure. Right flank region prepped with Betadine, draped in usual sterile fashion, infiltrated locally with 1% lidocaine. Intravenous Fentanyl and Versed were administered as conscious sedation during continuous monitoring of the patient's level of consciousness and physiological / cardiorespiratory status by the radiology RN, with a total moderate sedation time of 16 minutes. Under real-time fluoroscopic guidance, a 21-gauge trocar needle was advanced into a posterior upper pole calyx using the dominant peripheral radiodense calculus as a guide. A 018 guidewire advanced easily  into the renal collecting system. Contrast injection confirmed appropriate positioning. Needle was exchanged over a guidewire for transitional dilator. A parallel 035 angled Glidewire catheter was advanced into the proximal ureter. The transitional dilator was removed and a 5 [place] Kumpe the catheter advanced for antegrade nephrostogram. The Kumpe was then advanced over the guidewire into the urinary bladder. Radiograph confirms appropriate nephroureteral catheter positioning. Catheter capped and secured externally. The patient tolerated the procedure well. COMPLICATIONS: COMPLICATIONS None. IMPRESSION: 1. Technically successful right antegrade percutaneous nephroureteral catheter placement. Electronically Signed   By: Lucrezia Europe M.D.   On: 10/03/2018 15:12    Disposition: Discharge disposition: 01-Home or Self Care       Discharge Instructions    Discontinue IV   Complete by:  As directed       Follow-up Information    Irine Seal, MD Follow up on 10/18/2018.   Specialty:  Urology Why:  11am.    I am also going to arrange an appointment on Friday for you to come and have the tubes removed.   Contact information: [place]  [id number] [phone]            Signed: Irine Seal 10/04/2018, 7:32 AM

## 2018-10-04 NOTE — Plan of Care (Addendum)
Pt still on CLD. Pt's pain is being adequately controlled with current pain regimen. Pt C/O itching. On call notified. Levester Fresh) 1 time order for 25 mg Benadryl  Given @ [phone].  Pt complained again @ 0440. On call notified. Order for 25 mg Benadryl given.

## 2018-10-05 ENCOUNTER — Encounter (HOSPITAL_COMMUNITY): Admission: RE | Payer: Self-pay | Source: Ambulatory Visit

## 2018-10-05 ENCOUNTER — Ambulatory Visit (HOSPITAL_COMMUNITY): Admission: RE | Admit: 2018-10-05 | Payer: Medicare HMO | Source: Ambulatory Visit | Admitting: Urology

## 2018-10-05 SURGERY — NEPHROLITHOTOMY PERCUTANEOUS SECOND LOOK
Anesthesia: General | Laterality: Right

## 2018-10-09 ENCOUNTER — Ambulatory Visit (INDEPENDENT_AMBULATORY_CARE_PROVIDER_SITE_OTHER): Payer: Medicare HMO | Admitting: Podiatry

## 2018-10-09 DIAGNOSIS — B070 Plantar wart: Secondary | ICD-10-CM | POA: Diagnosis not present

## 2018-10-09 DIAGNOSIS — R6889 Other general symptoms and signs: Secondary | ICD-10-CM | POA: Diagnosis not present

## 2018-10-10 ENCOUNTER — Ambulatory Visit (INDEPENDENT_AMBULATORY_CARE_PROVIDER_SITE_OTHER): Payer: Medicare HMO | Admitting: Dietician

## 2018-10-10 ENCOUNTER — Encounter: Payer: Self-pay | Admitting: Dietician

## 2018-10-10 ENCOUNTER — Ambulatory Visit (INDEPENDENT_AMBULATORY_CARE_PROVIDER_SITE_OTHER): Payer: Medicare HMO | Admitting: Internal Medicine

## 2018-10-10 ENCOUNTER — Encounter: Payer: Self-pay | Admitting: Internal Medicine

## 2018-10-10 DIAGNOSIS — Z794 Long term (current) use of insulin: Secondary | ICD-10-CM

## 2018-10-10 DIAGNOSIS — Z6824 Body mass index (BMI) 24.0-24.9, adult: Secondary | ICD-10-CM

## 2018-10-10 DIAGNOSIS — Z713 Dietary counseling and surveillance: Secondary | ICD-10-CM

## 2018-10-10 DIAGNOSIS — E119 Type 2 diabetes mellitus without complications: Secondary | ICD-10-CM | POA: Diagnosis not present

## 2018-10-10 DIAGNOSIS — R6889 Other general symptoms and signs: Secondary | ICD-10-CM | POA: Diagnosis not present

## 2018-10-10 MED ORDER — INSULIN GLARGINE 100 UNIT/ML SOLOSTAR PEN: 24.0000 [IU] | PEN_INJECTOR | Freq: Every day | SUBCUTANEOUS | 5 refills | Status: DC

## 2018-10-10 NOTE — Patient Instructions (Signed)
FOLLOW-UP INSTRUCTIONS When: 2 months For: Routine with your primary doctor What to bring: all of your medications  I have decreased your Lantus to 24 units daily. Please do not take more than this and please begin taking this in the morning instead of the evening.   Today we discussed your diabetes. I agree to decrease your insulin. We will need to consider other oral medications in the future to more adequately control you diabetes.   Thank you for your visit to the [facility] today. If you have any questions or concerns please call us at [phone].

## 2018-10-10 NOTE — Progress Notes (Signed)
   CC: diabetes  HPI:[name] is a 62 y.o. female who presents for evaluation of diabetes. Please see individual problem based A/P for details.  Diabetes: [name] wore the CGM for 7 days from her latest download. The average reading was 122, % time in target was 80, % time below target was 8, and % time above target was. 12. Intervention will be to decrease her Lantus to 24U every morning. The patient will be scheduled to see her PCP for further evaluation.   Plan: I feel that she may benefit from continued decreased Lantus given her A1c of 6.3% and information from her CGM. I would recommend obtaining an A1c in Late December to early January for reassessment and if she remains low I would consider an additional decrease in insulin if her daily blood glucose monitoring supports this.   PHQ-9: Based on the patients    Office Visit from 10/10/2018 in [place]  PHQ-9 Total Score  7     score we have decided to monitor.  Past Medical History:  Diagnosis Date  . Anxiety   . Arthritis   . Chest pain 09/26/2018  . Congenital blindness    Left  eye  . Coronary artery disease    per cardiac cath 2015-- mild disease involving LAD and branches ,  LCFx and branches, and mild disease pRCA  . Depression   . Diabetes mellitus, type II ([place])    followed by pcp  . Glaucoma   . Glaucoma, both eyes   . Hepatitis B infection pt unsure   resolved  . History of transient ischemic attack (TIA) 2017   per pt no residuals  . HTN (hypertension)    followed by pcp  . Hyperlipidemia   . Poor dental hygiene   . Renal calculus, right   . Stroke ([place])   . Tobacco abuse    .5 PPD smoker   Review of Systems:  ROS negative except as per HPI.  Physical Exam: Vitals:   10/10/18 1051  BP: 132/73  Pulse: 63  Temp: 99.2 F (37.3 C)  TempSrc: Oral  SpO2: 99%  Weight: 154 lb 6.4 oz (70 kg)   General: A/O x4, no acute distress, afebrile,  nondiaphoretic HEENT: EOM intact MSK: Bilateral lower extremities nonedematous, nontender to palpation  Assessment & Plan:   See Encounters Tab for problem based charting.  Patient discussed with Dr. Rebeca Alert

## 2018-10-10 NOTE — Progress Notes (Addendum)
  Medical Nutrition Therapy: Follow up   Appt start time: 1045 end time:  1100.  Assessment:  Primary concerns today: Weight management and Blood sugar control. Ms. Nater presents for CGM download #2. See Dr. Nelma Rothman note for details of CGM download. She has low blood sugar fasting ~ 4-8 am and during the day and increased values after her "snack" at night. The variability increased during the second week between 8 PM and 3 AM.   She is agreeable to try to eat more earlier in the day.   Usual eating pattern includes 1 meal and 1-2 snacks per day. Everyday foods include peanut butter, cereal and milk   24-hr recall: B ( AM)- coffee with cream and sugar; D ( ~ 4PM)- largest meal per patient chicken and rice  Snack- ~ (7-8 PM)  regular or diet soda,   Estimated body mass index is 24.18 kg/m as calculated from the following:   Height as of 10/03/18: 5\' 7"  (1.702 m).   Weight as of an earlier encounter on 10/10/18: 154 lb 6.4 oz (70 kg).  Wt Readings from Last 5 Encounters:  10/10/18 154 lb 6.4 oz (70 kg)  10/03/18 156 lb (70.8 kg)  09/29/18 156 lb (70.8 kg)  09/28/18 158 lb 6 oz (71.8 kg)  09/26/18 158 lb 1.6 oz (71.7 kg)   Usual physical activity includes walks to store a few times a week. MEDICATIONS Takes 30 units lantus ~ 8 PM at night (being reduced today due to hypoglycemia to 24 units  Lab Results  Component Value Date   HGBA1C 6.3 (H) 09/28/2018    Progress Towards Goal(s):  In progress.   Nutritional Diagnosis:  NI-5.8.4 Inconsistent carbohydrate intake As related to  eating one meal per day and higher evening blood sugars.  As evidenced by her blood sugar pattern of increasing blood sugars over the course of the day.    Intervention:  Nutrition education and support for consistent carb meal planning encouraging her to eat more earlier in the day to help both her weight and her blood sugar.Samples and coupons for boost given to her today. Reviewed signs, symptoms and  treatment of low blood sugar  Monitoring/Evaluation:  Dietary intake, exercise, meter and CGM sensor, and body weight in 3 month(s)  Debera Lat, RD 10/10/2018 3:26 PM. .

## 2018-10-10 NOTE — Progress Notes (Signed)
   Subjective: 62 year old female presenting today as a new patient with a chief complaint of plantar warts on bilateral feet that have been recurring for the past 2 years. Walking and bearing weight increases the pain. She has not done anything for treatment at home. Patient is here for further evaluation and treatment.    Past Medical History:  Diagnosis Date  . Anxiety   . Arthritis   . Chest pain 09/26/2018  . Congenital blindness    Left  eye  . Coronary artery disease    per cardiac cath 2015-- mild disease involving LAD and branches ,  LCFx and branches, and mild disease pRCA  . Depression   . Diabetes mellitus, type II ([place])    followed by pcp  . Glaucoma   . Glaucoma, both eyes   . Hepatitis B infection pt unsure   resolved  . History of transient ischemic attack (TIA) 2017   per pt no residuals  . HTN (hypertension)    followed by pcp  . Hyperlipidemia   . Poor dental hygiene   . Renal calculus, right   . Stroke ([place])   . Tobacco abuse    .5 PPD smoker    Objective: Physical Exam General: The patient is alert and oriented x3 in no acute distress.   Dermatology: Hyperkeratotic skin lesions noted to the plantar aspect of the bilateral feet approximately 1 cm in diameter. Pinpoint bleeding noted upon debridement. Skin is warm, dry and supple bilateral lower extremities. Negative for open lesions or macerations.   Vascular: Palpable pedal pulses bilaterally. No edema or erythema noted. Capillary refill within normal limits.   Neurological: Epicritic and protective threshold grossly intact bilaterally.    Musculoskeletal Exam: Pain on palpation to the note skin lesion.  Range of motion within normal limits to all pedal and ankle joints bilateral. Muscle strength 5/5 in all groups bilateral.    Assessment: - plantar wart foot, bilateral x 3    Plan of Care:  - Patient was evaluated. - Excisional debridement of the plantar wart lesion(s) was performed using a  chisel blade. Salinocaine was applied and the lesion(s) was dressed with a dry sterile dressing. - patient is to return to clinic as needed.   Edrick Kins, DPM [facility]  Dr. Edrick Kins, [address]                                        [place], [place] [address]                Office [phone]  Fax [phone]

## 2018-10-10 NOTE — Patient Instructions (Signed)
Try to eat something small  With your coffee int he morning- like peanut butter crackers, half peanut butter sandwich or peanut butter on banana  This will help even your blood sugars out and hoping you won't be so hungry at night and can decrease portions of your evening meal. (that is where your blood sugar is above target).  Let's follow up after Christmas.   Susan Holmes [phone]  Symptoms of low blood sugar- trembling, Sweaty, weakness, hunger,  Dizzy- check your blood sugar to be sure it is not too low.

## 2018-10-11 NOTE — Assessment & Plan Note (Addendum)
Diabetes: Susan Holmes wore the CGM for 7 days from her latest download. The average reading was 122, % time in target was 80, % time below target was 8, and % time above target was. 12. Intervention will be to decrease her Lantus to 24U nightly. The patient will be scheduled to see her PCP for further evaluation.   Plan: I feel that she may benefit from continued decreased Lantus given her A1c of 6.3% and information from her CGM. I would recommend obtaining an A1c in Late December to early January for reassessment and if she remains low I would consider an additional decrease in insulin if her daily blood glucose monitoring supports this.

## 2018-10-12 LAB — STONE ANALYSIS
Ca Oxalate,Dihydrate: 5 %
Ca Oxalate,Monohydr.: 90 %
Ca phos cry stone ql IR: 5 %
Stone Weight KSTONE: 123.7 mg

## 2018-10-16 DIAGNOSIS — R6889 Other general symptoms and signs: Secondary | ICD-10-CM | POA: Diagnosis not present

## 2018-10-16 DIAGNOSIS — N200 Calculus of kidney: Secondary | ICD-10-CM | POA: Diagnosis not present

## 2018-10-18 NOTE — Progress Notes (Signed)
Internal Medicine Clinic Attending  Case discussed with Dr. Harbrecht at the time of the visit. We reviewed the resident's history and exam and pertinent patient test results. I personally reviewed the CGM data & the resident's interpretation. I agree with the assessment, diagnosis, and plan of care documented in the resident's note.   

## 2018-10-25 DIAGNOSIS — R6889 Other general symptoms and signs: Secondary | ICD-10-CM | POA: Diagnosis not present

## 2018-10-25 DIAGNOSIS — F331 Major depressive disorder, recurrent, moderate: Secondary | ICD-10-CM | POA: Diagnosis not present

## 2018-10-30 DIAGNOSIS — R6889 Other general symptoms and signs: Secondary | ICD-10-CM | POA: Diagnosis not present

## 2018-10-30 DIAGNOSIS — Z961 Presence of intraocular lens: Secondary | ICD-10-CM | POA: Diagnosis not present

## 2018-10-30 DIAGNOSIS — H401113 Primary open-angle glaucoma, right eye, severe stage: Secondary | ICD-10-CM | POA: Diagnosis not present

## 2018-11-10 ENCOUNTER — Ambulatory Visit: Payer: Medicare HMO | Admitting: Cardiovascular Disease

## 2018-11-24 ENCOUNTER — Other Ambulatory Visit: Payer: Self-pay | Admitting: Internal Medicine

## 2018-11-24 DIAGNOSIS — F32A Depression, unspecified: Secondary | ICD-10-CM

## 2018-11-24 DIAGNOSIS — F329 Major depressive disorder, single episode, unspecified: Secondary | ICD-10-CM

## 2018-11-27 DIAGNOSIS — R6889 Other general symptoms and signs: Secondary | ICD-10-CM | POA: Diagnosis not present

## 2018-11-27 DIAGNOSIS — D49511 Neoplasm of unspecified behavior of right kidney: Secondary | ICD-10-CM | POA: Diagnosis not present

## 2018-11-27 DIAGNOSIS — N200 Calculus of kidney: Secondary | ICD-10-CM | POA: Diagnosis not present

## 2018-11-27 DIAGNOSIS — R197 Diarrhea, unspecified: Secondary | ICD-10-CM | POA: Diagnosis not present

## 2018-11-27 DIAGNOSIS — R634 Abnormal weight loss: Secondary | ICD-10-CM | POA: Diagnosis not present

## 2018-11-29 NOTE — Telephone Encounter (Signed)
Pls sch PCP appt Jan / Feb DM F/U

## 2018-12-14 DIAGNOSIS — R6889 Other general symptoms and signs: Secondary | ICD-10-CM | POA: Diagnosis not present

## 2018-12-29 ENCOUNTER — Other Ambulatory Visit: Payer: Self-pay | Admitting: *Deleted

## 2018-12-29 DIAGNOSIS — I10 Essential (primary) hypertension: Secondary | ICD-10-CM

## 2018-12-31 MED ORDER — CARVEDILOL 12.5 MG PO TABS: ORAL_TABLET | ORAL | 6 refills | Status: DC

## 2019-01-05 ENCOUNTER — Other Ambulatory Visit: Payer: Self-pay | Admitting: *Deleted

## 2019-01-05 DIAGNOSIS — E785 Hyperlipidemia, unspecified: Principal | ICD-10-CM

## 2019-01-05 DIAGNOSIS — E1169 Type 2 diabetes mellitus with other specified complication: Secondary | ICD-10-CM

## 2019-01-05 MED ORDER — ROSUVASTATIN CALCIUM 10 MG PO TABS: 10.0000 mg | ORAL_TABLET | Freq: Every day | ORAL | 1 refills | Status: DC

## 2019-01-06 ENCOUNTER — Other Ambulatory Visit: Payer: Self-pay | Admitting: Cardiovascular Disease

## 2019-01-08 NOTE — Telephone Encounter (Signed)
chlorthalidone (HYGROTON) 25 MG tablet  Medication  Date: 09/29/2018 Department: [facility] Ordering/Authorizing: Skeet Latch, MD  Order Providers   Prescribing Provider Encounter Provider  Skeet Latch, MD Skeet Latch, MD  Outpatient Medication Detail    Disp Refills Start End   chlorthalidone (HYGROTON) 25 MG tablet 45 tablet 1 09/29/2018    Sig: 1/2 TABLET BY MOUTH DAILY   Sent to pharmacy as: chlorthalidone (HYGROTON) 25 MG tablet   E-Prescribing Status: Receipt confirmed by pharmacy (09/29/2018 10:30 AM EDT)   Pharmacy   [id number] - [place], [place]

## 2019-01-30 ENCOUNTER — Ambulatory Visit: Payer: Medicare HMO

## 2019-02-01 ENCOUNTER — Encounter: Payer: Self-pay | Admitting: Internal Medicine

## 2019-02-01 ENCOUNTER — Ambulatory Visit (INDEPENDENT_AMBULATORY_CARE_PROVIDER_SITE_OTHER): Payer: Medicare Other | Admitting: Internal Medicine

## 2019-02-01 ENCOUNTER — Other Ambulatory Visit: Payer: Self-pay

## 2019-02-01 VITALS — BP 130/90 | HR 62 | Temp 97.5°F | Ht 67.0 in | Wt 158.0 lb

## 2019-02-01 DIAGNOSIS — E119 Type 2 diabetes mellitus without complications: Secondary | ICD-10-CM

## 2019-02-01 DIAGNOSIS — R358 Other polyuria: Secondary | ICD-10-CM | POA: Diagnosis not present

## 2019-02-01 DIAGNOSIS — E1165 Type 2 diabetes mellitus with hyperglycemia: Secondary | ICD-10-CM | POA: Diagnosis not present

## 2019-02-01 DIAGNOSIS — R3589 Other polyuria: Secondary | ICD-10-CM

## 2019-02-01 LAB — BASIC METABOLIC PANEL
Anion gap: 13 (ref 5–15)
BUN: 8 mg/dL (ref 8–23)
CO2: 26 mmol/L (ref 22–32)
Calcium: 10.5 mg/dL — ABNORMAL HIGH (ref 8.9–10.3)
Chloride: 96 mmol/L — ABNORMAL LOW (ref 98–111)
Creatinine, Ser: 1.07 mg/dL — ABNORMAL HIGH (ref 0.44–1.00)
GFR calc Af Amer: >60 mL/min (ref >60)
GFR calc non Af Amer: 55 mL/min — ABNORMAL LOW (ref >60)
Glucose, Bld: 509 mg/dL (ref 70–99)
Potassium: 4.1 mmol/L (ref 3.5–5.1)
Sodium: 135 mmol/L (ref 135–145)

## 2019-02-01 LAB — POCT URINALYSIS DIPSTICK
Bilirubin, UA: NEGATIVE
Glucose, UA: POSITIVE — AB
Ketones, UA: NEGATIVE
Leukocytes, UA: NEGATIVE
Nitrite, UA: NEGATIVE
Protein, UA: NEGATIVE
Spec Grav, UA: 1.015 (ref 1.010–1.025)
Urobilinogen, UA: 0.2 E.U./dL
pH, UA: 7 (ref 5.0–8.0)

## 2019-02-01 LAB — POCT GLYCOSYLATED HEMOGLOBIN (HGB A1C): Hemoglobin A1C: 10.6 % — AB (ref 4.0–5.6)

## 2019-02-01 LAB — GLUCOSE, CAPILLARY: Glucose-Capillary: 474 mg/dL — ABNORMAL HIGH (ref 70–99)

## 2019-02-01 MED ORDER — DULAGLUTIDE 0.75 MG/0.5ML ~~LOC~~ SOAJ: 0.7500 mg | SUBCUTANEOUS | 0 refills | Status: DC

## 2019-02-01 MED ORDER — SITAGLIPTIN PHOS-METFORMIN HCL 50-500 MG PO TABS: 1.0000 | ORAL_TABLET | ORAL | 0 refills | Status: DC

## 2019-02-01 NOTE — Assessment & Plan Note (Addendum)
Patient is here for diabetes follow up. She is uncontrolled and symptomatic. Her average CBG for the past month on her glucometer is 402 with a couple of readings too high to calculate. Hemoglobin A1c has increased 10.6 from 6.3 four months ago. She denies a change in her diet. She endorses symptoms of nausea, polyuria, and polydipsia. On review of prior notes, she was previously prescribed lantus, meal time humalog, janumet, and trulicity. Over the past year she has experienced weight loss with reduction in her A1c. Her insulin has been down titrated. She is now off meal time insulin, currently using lantus 34 units QHS. Documentation is unclear but she is no longer taking janumet or trulicity. I am unsure if these were purposefully discontinued or inadvertently fell off of her medication list. STAT labs were checked today to rule out ketoacidosis. BMP was without an anion gap, glucose of 509. Urinalysis was negative for ketones but with significant glucosuria. Discussed plan with patient to restart trulicity and janumet. Follow up in 2 weeks with glucometer. Return precautions given if she develops worsening symptoms.  -- Continue lantus 34 units QHS -- Start trulicity 8.58 mg qweekly -- Start Janumet 50-500 mg once daily with breakfast, increase after 1 week to BID with meals  -- Follow up 2 weeks with meter; uptitrate trulicity and janumet then if not at goal.

## 2019-02-01 NOTE — Progress Notes (Signed)
   CC: DM follow up  HPI:  [name] is a 62 y.o. female with past medical history outlined below here for Dm follow up. For the details of today's visit, please refer to the assessment and plan.  Past Medical History:  Diagnosis Date  . Anxiety   . Arthritis   . Chest pain 09/26/2018  . Congenital blindness    Left  eye  . Coronary artery disease    per cardiac cath 2015-- mild disease involving LAD and branches ,  LCFx and branches, and mild disease pRCA  . Depression   . Diabetes mellitus, type II ([place])    followed by pcp  . Glaucoma   . Glaucoma, both eyes   . Hepatitis B infection pt unsure   resolved  . History of transient ischemic attack (TIA) 2017   per pt no residuals  . HTN (hypertension)    followed by pcp  . Hyperlipidemia   . Poor dental hygiene   . Renal calculus, right   . Stroke ([place])   . Tobacco abuse    .5 PPD smoker    Review of Systems  Respiratory: Negative for shortness of breath.   Cardiovascular: Negative for chest pain.    Physical Exam:  Vitals:   02/01/19 0952  BP: 130/90  Pulse: 62  Temp: (!) 97.5 F (36.4 C)  TempSrc: Oral  SpO2: 100%  Weight: 158 lb (71.7 kg)  Height: 5\' 7"  (1.702 m)    Constitutional: NAD, appears comfortable Cardiovascular: RRR, no m/r/g Pulmonary/Chest: CTAB, no wheezes, rales, or rhonchi.  Extremities: Warm and well perfused. No edema.  Psychiatric: Normal mood and affect  Assessment & Plan:   See Encounters Tab for problem based charting.  Patient discussed with Dr. Dareen Piano

## 2019-02-01 NOTE — Progress Notes (Signed)
Internal Medicine Clinic Attending  Case discussed with Dr. Guilloud at the time of the visit.  We reviewed the resident's history and exam and pertinent patient test results.  I agree with the assessment, diagnosis, and plan of care documented in the resident's note.  

## 2019-02-01 NOTE — Patient Instructions (Addendum)
Ms. Rallis,  It was a pleasure to meet you. I am sorry you have not been feeling well. Your symptoms are due to your blood sugar being so high. Please restart your janumet and your trulicity. Please take one tablet of your janumet once daily with breakfast for one week, then increase to one tablet twice a day with breakfast and dinner. Please inject trulicity once a week.   Follow up with [place] again in 2 weeks with your glucometer. If you have any questions or concerns, call our clinic at [phone] or after hours call [phone] and ask for the internal medicine resident on call. Thank you!  Dr. Philipp Ovens

## 2019-02-02 LAB — URINALYSIS, ROUTINE W REFLEX MICROSCOPIC
Bilirubin, UA: NEGATIVE
Ketones, UA: NEGATIVE
Leukocytes, UA: NEGATIVE
Nitrite, UA: NEGATIVE
Protein, UA: NEGATIVE
RBC, UA: NEGATIVE
Specific Gravity, UA: >=1.03 — AB (ref 1.005–1.030)
Urobilinogen, Ur: 0.2 mg/dL (ref 0.2–1.0)
pH, UA: 6.5 (ref 5.0–7.5)

## 2019-02-06 ENCOUNTER — Telehealth: Payer: Self-pay | Admitting: Internal Medicine

## 2019-02-06 NOTE — Telephone Encounter (Signed)
Pt is calling about a side effect on sitaGLIPtin-metformin (JANUMET) 50-500 MG tablet  , pt contact [phone] She is feeling weak and dizzy.

## 2019-02-06 NOTE — Telephone Encounter (Signed)
Please ask patient to stop the janumet, continue her long acting insulin and trulicity. She already has a follow up appointment scheduled on 2/20. If she does not improve after stopping the medication, please have her come in sooner for evaluation. Thanks.

## 2019-02-06 NOTE — Telephone Encounter (Signed)
Called pt - stated when she started taking Janumet, she's c/o weakness, dizziness, "sweating spells"; started new med on Friday. She had called the pharmacy; they told her her symptoms are from her "new medication".  Stated CBG is 210 (checked about 1 hr ago). Please advise. Thanks

## 2019-02-07 NOTE — Telephone Encounter (Signed)
Pt called / informed stop the janumet, continue her long acting insulin and trulicity. Keep her appt on 2/20. If she does not improve after stopping the medication, to call and schedule an earlier appt  per Dr Philipp Ovens. Pt verbalized understanding by repeating instructions. Stated she did not take Janumet today; continues to feel weak.

## 2019-02-15 ENCOUNTER — Ambulatory Visit (INDEPENDENT_AMBULATORY_CARE_PROVIDER_SITE_OTHER): Payer: Medicare Other | Admitting: Internal Medicine

## 2019-02-15 ENCOUNTER — Other Ambulatory Visit: Payer: Self-pay

## 2019-02-15 ENCOUNTER — Encounter: Payer: Self-pay | Admitting: Internal Medicine

## 2019-02-15 DIAGNOSIS — E1165 Type 2 diabetes mellitus with hyperglycemia: Secondary | ICD-10-CM | POA: Diagnosis not present

## 2019-02-15 DIAGNOSIS — I951 Orthostatic hypotension: Secondary | ICD-10-CM | POA: Diagnosis not present

## 2019-02-15 DIAGNOSIS — R61 Generalized hyperhidrosis: Secondary | ICD-10-CM | POA: Insufficient documentation

## 2019-02-15 LAB — GLUCOSE, CAPILLARY: Glucose-Capillary: 182 mg/dL — ABNORMAL HIGH (ref 70–99)

## 2019-02-15 MED ORDER — DULAGLUTIDE 1.5 MG/0.5ML ~~LOC~~ SOAJ: 1.5000 mg | SUBCUTANEOUS | 2 refills | Status: DC

## 2019-02-15 MED ORDER — SODIUM CHLORIDE 0.9 % IV BOLUS
1000.0000 mL | Freq: Once | INTRAVENOUS | Status: AC
  Administered 2019-02-15: 1000 mL via INTRAVENOUS

## 2019-02-15 NOTE — Progress Notes (Signed)
   CC: Diabetes follow up  HPI:  [name] is a 63 y.o. female with past medical history outlined below here for diabetes follow up. For the details of today's visit, please refer to the assessment and plan.  Past Medical History:  Diagnosis Date  . Anxiety   . Arthritis   . Chest pain 09/26/2018  . Congenital blindness    Left  eye  . Coronary artery disease    per cardiac cath 2015-- mild disease involving LAD and branches ,  LCFx and branches, and mild disease pRCA  . Depression   . Diabetes mellitus, type II ([place])    followed by pcp  . Glaucoma   . Glaucoma, both eyes   . Hepatitis B infection pt unsure   resolved  . History of transient ischemic attack (TIA) 2017   per pt no residuals  . HTN (hypertension)    followed by pcp  . Hyperlipidemia   . Poor dental hygiene   . Renal calculus, right   . Stroke ([place])   . Tobacco abuse    .5 PPD smoker    Review of Systems  Constitutional: Positive for diaphoresis and malaise/fatigue.  Respiratory: Negative for shortness of breath.   Cardiovascular: Negative for chest pain.  Neurological: Positive for dizziness.    Physical Exam:  Vitals:   02/15/19 0945  BP: 134/78  Pulse: 64  Temp: 98 F (36.7 C)  TempSrc: Oral  SpO2: 100%  Weight: 157 lb 11.2 oz (71.5 kg)  Height: 5\' 7"  (1.702 m)    Constitutional: NAD, appears comfortable Cardiovascular: RRR, no m/r/g Pulmonary/Chest: CTAB, no wheezes, rales, or rhonchi.  Extremities: Warm and well perfused. No edema.  Psychiatric: Normal mood and affect  Assessment & Plan:   See Encounters Tab for problem based charting.  Patient discussed with Dr. Daryll Drown

## 2019-02-15 NOTE — Assessment & Plan Note (Signed)
Patient is here for diabetes follow-up.  Glycemic control has significantly improved since restarting Lantus and Trulicity.  Janumet was discontinued as this was giving her nausea, weakness, and dizziness.  Also given that she is already on a GLP-1 agonist, the DPP 4 inhibitor is duplicate therapy.  Patient remains above goal however glucose has improved from the 400-500 range to 200s on average.  There was some confusion with her Lantus dosing and she has been using 24 units nightly instead of 34. -Continue Lantus 24 units nightly -Increase Trulicity 1.5 mg weekly -Follow-up 2 months for repeat hemoglobin A1c

## 2019-02-15 NOTE — Assessment & Plan Note (Signed)
Patient is complaining of dizziness.  Orthostatic vitals today are positive.  Suspect she is dehydrated secondary to symptomatic hyperglycemia with polyuria and polydipsia.  Overall her glycemic control has improved with reinitiation of her insulin and Trulicity.  She was given 1 L normal saline bolus today in clinic.

## 2019-02-15 NOTE — Assessment & Plan Note (Addendum)
Patient is complaining of episodes of "sweating spells" and weakness throughout the day. She is post menopausal. She is unsure what is triggering these episodes. Instructed patient to check her blood pressure, blood sugar, and temperature during these episodes and keep a diary.  -- Follow up 1 month

## 2019-02-15 NOTE — Patient Instructions (Addendum)
Ms. Hamor,  Please continue to use 24 units of lantus nightly. I have increased your trulicity to 1.5 mg weekly.   For your sweating epsiodes, please keep a diary and record your blood sugar, blood pressure, and temperature during these episodes. Follow up again in 1 month.   If you have any questions or concerns, call our clinic at [phone] or after hours call [phone] and ask for the internal medicine resident on call. Thank you!  Dr. Philipp Ovens

## 2019-02-19 NOTE — Progress Notes (Signed)
Internal Medicine Clinic Attending  Case discussed with Dr. Guilloud at the time of the visit.  We reviewed the resident's history and exam and pertinent patient test results.  I agree with the assessment, diagnosis, and plan of care documented in the resident's note.  

## 2019-03-26 NOTE — Progress Notes (Signed)
This is a telephone encounter between Susan Holmes and Dr. Vilma Prader for healthcare maintenance. The visit was conducted with the patient located at home and me at [facility]. The patient's identity was confirmed using their DOB and current address. The patient has consented to being evaluated through a telephone encounter and understands the associated risks/benefits. I personally spent 10 minutes on medical discussion.   HPI:   [name] is a 63 y.o. female with the medical conditions listed below. She is currently practicing social distancing and reports that she feels well. She denies cough or fever. She requests refills of all of her medications today.   Night sweats: Patient was seen in clinic 2/20 for diaphoretic episodes. She states that these episodes have increased in frequency. They occur only at night, sometimes when she is awake and sometimes when she sleeps. She states she gets soaking wet with sweat from head to foot. They happen every night and last 5-10 minutes. She has checked her blood pressure, blood sugar, and temperature during these episodes, and they are always normal. She went through menopause 20 years ago. She denies fevers, chills, weight loss, headaches, and cough.   Weakness and fatigue: The patient reports that for the past few months she gets tired when she cleans her house. She requests home health to help her clean her house. I explained that this is not an appropriate use for home health. She became frustrated and hung up the phone.   I was also hoping to discuss her DMII and HTN during this visit, but did not get to this before she ended the conversation.   Past Medical History:  Diagnosis Date  . Anxiety   . Arthritis   . Chest pain 09/26/2018  . Congenital blindness    Left  eye  . Coronary artery disease    per cardiac cath 2015-- mild disease involving LAD and branches ,  LCFx and branches, and mild disease pRCA  . Depression   . Diabetes  mellitus, type II ([place])    followed by pcp  . Glaucoma   . Glaucoma, both eyes   . Hepatitis B infection pt unsure   resolved  . History of transient ischemic attack (TIA) 2017   per pt no residuals  . HTN (hypertension)    followed by pcp  . Hyperlipidemia   . Poor dental hygiene   . Renal calculus, right   . Stroke ([place])   . Tobacco abuse    .5 PPD smoker    Review of Systems:   Pertinent positives mentioned in HPI. Remainder of all ROS negative.   Assessment & Plan:   Night sweats: Low suspicion of infectious etiology given that the patient has been afebrile and otherwise asymptomatic. Low suspicion of malignancy given lack of other B symptoms. She is up to date on all of her age-appropriate cancer screenings including colonoscopy and mammogram. She is s/p hysterectomy and does not require pap smears. She is a current smoker and smokes 1/2 ppd. She denies cough, hemoptysis. I do not think she warrants a chest CT at this time. She has a normal TSH 05/2018. Her symptoms may be caused by medications, particularly duloxetine. I discussed wearing sweat-wicking clothes and keeping her bedroom cool at night, especially as spring and summer near. I advised the patient to continue monitoring the episodes and to call clinic if they do not improve in the next few weeks. At that time, she may require a trial off of  duloxetine.   I refilled the patient's amlodipine and trulicity as requested.  I will schedule the patient for another tele-health visit at my next available continuity clinic in order to discuss her other medical conditions.   Patient discussed with Dr. Evette Doffing

## 2019-03-27 ENCOUNTER — Encounter: Payer: Medicare Other | Admitting: Internal Medicine

## 2019-03-27 ENCOUNTER — Ambulatory Visit (INDEPENDENT_AMBULATORY_CARE_PROVIDER_SITE_OTHER): Payer: Medicare Other | Admitting: Internal Medicine

## 2019-03-27 ENCOUNTER — Other Ambulatory Visit: Payer: Self-pay

## 2019-03-27 DIAGNOSIS — E1165 Type 2 diabetes mellitus with hyperglycemia: Secondary | ICD-10-CM

## 2019-03-27 DIAGNOSIS — I10 Essential (primary) hypertension: Secondary | ICD-10-CM

## 2019-03-27 DIAGNOSIS — R61 Generalized hyperhidrosis: Secondary | ICD-10-CM

## 2019-03-27 DIAGNOSIS — R5383 Other fatigue: Secondary | ICD-10-CM

## 2019-03-27 MED ORDER — DULAGLUTIDE 1.5 MG/0.5ML ~~LOC~~ SOAJ: 1.5000 mg | SUBCUTANEOUS | 2 refills | Status: DC

## 2019-03-27 MED ORDER — AMLODIPINE BESYLATE 10 MG PO TABS: 5.0000 mg | ORAL_TABLET | Freq: Every morning | ORAL | 2 refills | Status: DC

## 2019-03-28 ENCOUNTER — Encounter: Payer: Self-pay | Admitting: Internal Medicine

## 2019-03-29 NOTE — Addendum Note (Signed)
Addended by: Lalla Brothers T on: 03/29/2019 09:59 AM   Modules accepted: Level of Service

## 2019-04-24 ENCOUNTER — Other Ambulatory Visit: Payer: Self-pay

## 2019-04-24 NOTE — Patient Outreach (Signed)
[facility]) Care Management  04/24/2019  Susan Holmes [birth year] [id number]   Medication Adherence call to Susan Holmes patient did not answer patient is due on Susan Holmes under [facility].   [facility] Management Direct Dial [phone]  Fax [phone] [name]

## 2019-04-27 ENCOUNTER — Other Ambulatory Visit: Payer: Self-pay | Admitting: Cardiovascular Disease

## 2019-05-07 NOTE — Progress Notes (Signed)
   This is a telephone encounter between [name] and me for a healthcare maintenance visit. The visit was conducted with the patient located at home and me at [facility]. The patient's identity was confirmed using their DOB and current address. The patient has consented to being evaluated through a telephone encounter and understands the associated risks/benefits. I personally spent 12 minutes on medical discussion.   HPI:   [name] is a 63 y.o. female with the medical conditions listed below. Today we discussed HTN, DMII, HLD, sweating episodes, and unintentional weight loss. Please see problem based charting for the history and status of the patient's current and chronic medical conditions.   Past Medical History:  Diagnosis Date  . Anxiety   . Arthritis   . Chest pain 09/26/2018  . Congenital blindness    Left  eye  . Coronary artery disease    per cardiac cath 2015-- mild disease involving LAD and branches ,  LCFx and branches, and mild disease pRCA  . Depression   . Diabetes mellitus, type II ([place])    followed by pcp  . Glaucoma   . Glaucoma, both eyes   . Hepatitis B infection pt unsure   resolved  . History of transient ischemic attack (TIA) 2017   per pt no residuals  . HTN (hypertension)    followed by pcp  . Hyperlipidemia   . Poor dental hygiene   . Renal calculus, right   . Stroke ([place])   . Tobacco abuse    .5 PPD smoker    Review of Systems:   Pertinent positives mentioned in HPI. Remainder of all ROS negative.   Assessment & Plan:   Patient discussed with Dr. Daryll Drown

## 2019-05-08 ENCOUNTER — Encounter: Payer: Self-pay | Admitting: Internal Medicine

## 2019-05-08 ENCOUNTER — Ambulatory Visit (INDEPENDENT_AMBULATORY_CARE_PROVIDER_SITE_OTHER): Payer: Medicare Other | Admitting: Internal Medicine

## 2019-05-08 ENCOUNTER — Other Ambulatory Visit: Payer: Self-pay

## 2019-05-08 DIAGNOSIS — E1169 Type 2 diabetes mellitus with other specified complication: Secondary | ICD-10-CM | POA: Diagnosis not present

## 2019-05-08 DIAGNOSIS — I10 Essential (primary) hypertension: Secondary | ICD-10-CM

## 2019-05-08 DIAGNOSIS — R61 Generalized hyperhidrosis: Secondary | ICD-10-CM

## 2019-05-08 DIAGNOSIS — I951 Orthostatic hypotension: Secondary | ICD-10-CM | POA: Diagnosis not present

## 2019-05-08 DIAGNOSIS — E1165 Type 2 diabetes mellitus with hyperglycemia: Secondary | ICD-10-CM | POA: Diagnosis not present

## 2019-05-08 DIAGNOSIS — E785 Hyperlipidemia, unspecified: Secondary | ICD-10-CM

## 2019-05-08 DIAGNOSIS — R634 Abnormal weight loss: Secondary | ICD-10-CM

## 2019-05-08 MED ORDER — ROSUVASTATIN CALCIUM 10 MG PO TABS: 20.0000 mg | ORAL_TABLET | Freq: Every day | ORAL | 1 refills | Status: DC

## 2019-05-08 NOTE — Assessment & Plan Note (Signed)
From 3/31 telehealth visit:  Patient was seen in clinic 2/20 for diaphoretic episodes. She states that these episodes have increased in frequency. They occur only at night, sometimes when she is awake and sometimes when she sleeps. She states she gets soaking wet with sweat from head to foot. They happen every night and last 5-10 minutes. She has checked her blood pressure, blood sugar, and temperature during these episodes, and they are always normal. She went through menopause 20 years ago.  - Discussed sweat wicking fabrics and keeping her bedroom cool   Today, Susan Holmes reports that the episodes have continued. She now has them during the day as well as at night. She does not take cymbalta anymore.   Plan - See unintentional weight loss tab

## 2019-05-08 NOTE — Assessment & Plan Note (Signed)
Patient reports this has resolved. Likely indicative of improved glycemic control and hydration status.

## 2019-05-08 NOTE — Assessment & Plan Note (Signed)
Last A1c was 10.6 in February. At that time she was started on janumet and trulicity in addition to her home lantus 24u qhs. The janumet has since been discontinued due to nausea, weakness, and dizziness. She states good compliance with her lantus and trulicity. She checks her sugars 1-2x per day, usually at night. She says her blood sugars vary depending on what she has eaten, but they are usually about 140. The lowest number she's seen is 98. She can't remember the highest number. Her orthostatic hypotension has resolved indicating improved glycemic control.   Plan - Continue Lantus 24u qhs - Continue Trulicity  - Office visit in 2-3 months for repeat A1c

## 2019-05-08 NOTE — Assessment & Plan Note (Signed)
Reports compliance with medications. Occasionally checks blood pressure at home and says "it is normal," but is unsure of the numbers. Denies headache, vision changes, chest pain, or lower extremity edema. She was well-controlled at last in-person office visit.   Plan - Continue amlodipine 5mg  daily, coreg 12.5mg  BID, and losartan 100mg  daily

## 2019-05-08 NOTE — Assessment & Plan Note (Signed)
Tolerating crestor 10mg  without issue.   Plan - Increase to crestor 20mg  daily

## 2019-05-08 NOTE — Assessment & Plan Note (Addendum)
HPI: Susan Holmes continues to endorse unintentional weight loss. She estimates she has lost 25lbs in the past four months. She states her appetite is good and she is not eating less food than she used to. She has not been exercising or had increased activity either. She has a dry cough and frequent episodes of sweating that occur both during the day and at night. She has an occasional dry cough that is not new. She smokes about 1/2ppd of cigarettes. She denies fevers, lymphadenopathy, breast lumps, skin changes, hemoptysis, hematochezia, melena.   Assessment: Patient has endorsed unintentional weight loss for about a year now. TSH, LFTs, and renal function were normal during initial workup. Patient has no localizing symptoms, but given her smoking history, there is concern for underlying malignancy. She is up to date on her colonoscopy (last was in 2018, benign polyps found, 5 year repeat recommended), and she states she is up to date on mammography as well. She had a chest CT in 2014, which was unremarkable. She is amenable to a chest CT to continue the workup.    Plan - Ambulatory referral to lung cancer screening group

## 2019-05-09 NOTE — Progress Notes (Signed)
Internal Medicine Clinic Attending  Case discussed with Dr. Annie Paras soon after the resident saw the patient.  We reviewed the resident's history, telephone conversation and pertinent patient test results.  I agree with the assessment, diagnosis, and plan of care documented in the resident's note.

## 2019-05-18 ENCOUNTER — Telehealth: Payer: Self-pay | Admitting: Pulmonary Disease

## 2019-05-22 ENCOUNTER — Other Ambulatory Visit: Payer: Self-pay

## 2019-05-22 ENCOUNTER — Encounter: Payer: Self-pay | Admitting: Acute Care

## 2019-05-22 ENCOUNTER — Ambulatory Visit (INDEPENDENT_AMBULATORY_CARE_PROVIDER_SITE_OTHER): Payer: Medicare Other | Admitting: Acute Care

## 2019-05-22 DIAGNOSIS — F17210 Nicotine dependence, cigarettes, uncomplicated: Secondary | ICD-10-CM

## 2019-05-22 DIAGNOSIS — R634 Abnormal weight loss: Secondary | ICD-10-CM | POA: Diagnosis not present

## 2019-05-22 DIAGNOSIS — J45909 Unspecified asthma, uncomplicated: Secondary | ICD-10-CM | POA: Diagnosis not present

## 2019-05-22 DIAGNOSIS — F17200 Nicotine dependence, unspecified, uncomplicated: Secondary | ICD-10-CM

## 2019-05-22 LAB — CBC WITH DIFFERENTIAL/PLATELET
Basophils Absolute: 0 10*3/uL (ref 0.0–0.1)
Basophils Relative: 0.4 % (ref 0.0–3.0)
Eosinophils Absolute: 0.3 10*3/uL (ref 0.0–0.7)
Eosinophils Relative: 5.1 % — ABNORMAL HIGH (ref 0.0–5.0)
HCT: 38.3 % (ref 36.0–46.0)
Hemoglobin: 12.3 g/dL (ref 12.0–15.0)
Lymphocytes Relative: 44.5 % (ref 12.0–46.0)
Lymphs Abs: 2.9 10*3/uL (ref 0.7–4.0)
MCHC: 32.2 g/dL (ref 30.0–36.0)
MCV: 71.4 fl — ABNORMAL LOW (ref 78.0–100.0)
Monocytes Absolute: 0.3 10*3/uL (ref 0.1–1.0)
Monocytes Relative: 5.3 % (ref 3.0–12.0)
Neutro Abs: 2.9 10*3/uL (ref 1.4–7.7)
Neutrophils Relative %: 44.7 % (ref 43.0–77.0)
Platelets: 210 10*3/uL (ref 150.0–400.0)
RBC: 5.36 Mil/uL — ABNORMAL HIGH (ref 3.87–5.11)
RDW: 16.2 % — ABNORMAL HIGH (ref 11.5–15.5)
WBC: 6.6 10*3/uL (ref 4.0–10.5)

## 2019-05-22 LAB — COMPREHENSIVE METABOLIC PANEL
ALT: 23 U/L (ref 0–35)
AST: 18 U/L (ref 0–37)
Albumin: 4 g/dL (ref 3.5–5.2)
Alkaline Phosphatase: 78 U/L (ref 39–117)
BUN: 12 mg/dL (ref 6–23)
CO2: 26 mEq/L (ref 19–32)
Calcium: 10 mg/dL (ref 8.4–10.5)
Chloride: 107 mEq/L (ref 96–112)
Creatinine, Ser: 0.96 mg/dL (ref 0.40–1.20)
GFR: 70.72 mL/min (ref >60.00)
Glucose, Bld: 148 mg/dL — ABNORMAL HIGH (ref 70–99)
Potassium: 3.9 mEq/L (ref 3.5–5.1)
Sodium: 141 mEq/L (ref 135–145)
Total Bilirubin: 0.3 mg/dL (ref 0.2–1.2)
Total Protein: 6.7 g/dL (ref 6.0–8.3)

## 2019-05-22 MED ORDER — BUDESONIDE-FORMOTEROL FUMARATE 160-4.5 MCG/ACT IN AERO: 2.0000 | INHALATION_SPRAY | Freq: Two times a day (BID) | RESPIRATORY_TRACT | 0 refills | Status: DC | PRN

## 2019-05-22 NOTE — Telephone Encounter (Signed)
Error

## 2019-05-22 NOTE — Patient Instructions (Addendum)
It is good to see you today. We will schedule a CT Chest without contrast. We will call you with results We will try to get it ordered soon as possible. Continue to use your Symbicort 2 puffs twice daily  Rinse mouth after use. Please work on quitting smoking as we discussed. This is the single most powerful action you can take to decrease your risk of lung cancer, heart disease and stroke. We will do a few labs today. ( CBC with diff, CMET) We will call you with results Follow up after CT scan with Evyn Kooyman NP or Dr. Vaughan Browner. Please contact office for sooner follow up if symptoms do not improve or worsen or seek emergency care

## 2019-05-22 NOTE — Progress Notes (Signed)
History of Present Illness Susan Holmes is a 63 y.o. female every day smoker with a 21 pack year smoking history with dyspnea and some restrictive disease on PFT's she is followed by Dr. Vaughan Browner. Additional health history includes HTN, DMII, HLD.  Last OV 04/2017 She was started on Symbicort for dyspnea.  Maintenance medications  Symbicort 160/4.5>> she is compliant when she remembers.   05/22/2019 Acute Visit Pt. Presents for visit for unintentional weight loss x 6 + months , weight loss is about 30 pounds over the last 6-8 months. She states she has had a change in appetite , she has to force herself to eat. She states that this comes and goes.  She has not increased her activity or started to exercise. She continues to smoke 10 cigarettes daily. She has tried to quit in the past, but she has been unsuccessful. She states she is compliant with her Symbicort sometimes, whenever she thinks about using it. She endorses night sweats for several months.She has been having headaches and fatigue.She denies fever, chest pain, orthopnea or hemoptysis. Last colonoscopy 2018>>benign polyps found, 5 year repeat recommended)  She states she is up to date on mammography as well.  Last CT Chest was 2014>> see below   Test Results:  PFTs 10/01/14 FVC  1.79 (59%0 FEV1 1.44 [60%) F/F 80  PFTs 05/17/17 FVC 1.70 (57%) FEV1 1.54 [66%) F/F 90 TLC 56% DLCO 49% No obstruction, moderate-severe restriction in DLCO impairment Small airways disease with increase in flow rates after albuterol  FENO 05/17/17- 80  CT chest 10/31/13- no lung abnormality Chest x-ray 06/27/16-no active cardiopulmonary process Chest x-ray 03/10/17-cardio megaly with mild CHF    CBC Latest Ref Rng & Units 10/04/2018 10/03/2018 10/03/2018  WBC 4.0 - 10.5 K/uL - - 5.0  Hemoglobin 12.0 - 15.0 g/dL 12.3 11.6(L) 11.4(L)  Hematocrit 36.0 - 46.0 % 41.4 39.6 36.9  Platelets 150 - 400 K/uL - - 207    BMP Latest Ref Rng & Units  02/01/2019 10/04/2018 10/03/2018  Glucose 70 - 99 mg/dL 509(HH) 140(H) 100(H)  BUN 8 - 23 mg/dL 8 7(L) 9  Creatinine 0.44 - 1.00 mg/dL 1.07(H) 0.84 0.93  BUN/Creat Ratio 12 - 28 - - -  Sodium 135 - 145 mmol/L 135 139 147(H)  Potassium 3.5 - 5.1 mmol/L 4.1 3.5 3.6  Chloride 98 - 111 mmol/L 96(L) 100 113(H)  CO2 22 - 32 mmol/L _0 Calcium 8.9 - 10.3 mg/dL 10.5(H) 9.6 9.6    BNP No results found for: BNP  ProBNP    Component Value Date/Time   PROBNP <30.0 04/06/2008 1639    PFT    Component Value Date/Time   FEV1PRE 1.38 05/17/2017 1132   FEV1POST 1.54 05/17/2017 1132   FVCPRE 1.63 05/17/2017 1132   FVCPOST 1.70 05/17/2017 1132   TLC 3.12 05/17/2017 1132   DLCOUNC 14.07 05/17/2017 1132   PREFEV1FVCRT 85 05/17/2017 1132   PSTFEV1FVCRT 90 05/17/2017 1132    No results found.   Past medical hx Past Medical History:  Diagnosis Date  . Anxiety   . Arthritis   . Chest pain 09/26/2018  . Congenital blindness    Left  eye  . Coronary artery disease    per cardiac cath 2015-- mild disease involving LAD and branches ,  LCFx and branches, and mild disease pRCA  . Depression   . Diabetes mellitus, type II ([place])    followed by pcp  . Glaucoma   .  Glaucoma, both eyes   . Hepatitis B infection pt unsure   resolved  . History of transient ischemic attack (TIA) 2017   per pt no residuals  . HTN (hypertension)    followed by pcp  . Hyperlipidemia   . Poor dental hygiene   . Renal calculus, right   . Stroke ([place])   . Tobacco abuse    .5 PPD smoker     Social History   Tobacco Use  . Smoking status: Current Every Day Smoker    Packs/day: 0.50    Years: 39.00    Pack years: 19.50    Types: Cigarettes    Start date: 66  . Smokeless tobacco: Current User    Types: Chew  . Tobacco comment: < half pack per day  Substance Use Topics  . Alcohol use: No    Alcohol/week: 0.0 standard drinks  . Drug use: No    Ms.Hamada reports that she has been smoking  cigarettes. She started smoking about 39 years ago. She has a 19.50 pack-year smoking history. Her smokeless tobacco use includes chew. She reports that she does not drink alcohol or use drugs.  Tobacco Cessation: I have spent 3 minutes counseling patient on smoking cessation this visit. Patient verbalizes understanding of their  choice to continue smoking and the negative health consequences including worsening of COPD, risk of lung cancer , stroke and heart disease.Marland Kitchen    Past surgical hx, Family hx, Social hx all reviewed.  Current Outpatient Medications on File Prior to Visit  Medication Sig  . ACCU-CHEK FASTCLIX LANCETS MISC CHECK BLOOD SUGAR THREE TIMES DAILY AS DIRECTED  . amLODipine (NORVASC) 10 MG tablet Take 0.5 tablets (5 mg total) by mouth every morning.  Marland Kitchen aspirin EC 81 MG tablet Take 81 mg by mouth daily.  . Blood Glucose Monitoring Suppl (ACCU-CHEK GUIDE) w/Device KIT 1 each by Does not apply route 3 (three) times daily.  . carvedilol (COREG) 12.5 MG tablet TAKE 1 TABLET(12.5 MG) BY MOUTH TWICE DAILY  . docusate sodium (COLACE) 100 MG capsule Take 1 capsule (100 mg total) by mouth 2 (two) times daily.  . dorzolamide-timolol (COSOPT) 22.3-6.8 MG/ML ophthalmic solution Place 1 drop into both eyes 2 (two) times daily.   . Dulaglutide (TRULICITY) 1.5 EO/7.1QR SOPN Inject 1.5 mg into the skin once a week.  Marland Kitchen glucose blood (ONE TOUCH ULTRA TEST) test strip USE TO TEST BLOOD GLUCOSE 3 TIMES A DAY. Diagnosis code: E11.41  . Insulin Glargine (LANTUS SOLOSTAR) 100 UNIT/ML Solostar Pen Inject 24 Units into the skin at bedtime.  . Insulin Pen Needle (VALUMARK PEN NEEDLES) 31G X 8 MM MISC 1 each by Does not apply route daily.  . Loperamide HCl 1 MG/7.5ML LIQD Take 30 mLs (4 mg total) by mouth 3 (three) times daily as needed.  Marland Kitchen losartan (COZAAR) 100 MG tablet Take 1 tablet (100 mg total) by mouth daily. (Patient taking differently: Take 100 mg by mouth every morning. )  . mirtazapine (REMERON)  45 MG tablet Take 45 mg by mouth at bedtime.  . Multiple Vitamins-Minerals (CENTRUM SILVER PO) Take 1 tablet by mouth daily.  Marland Kitchen oxyCODONE-acetaminophen (PERCOCET) 5-325 MG tablet Take 1 tablet by mouth every 6 (six) hours as needed for severe pain.  . pantoprazole (PROTONIX) 40 MG tablet TAKE 1 TABLET BY MOUTH DAILY (Patient taking differently: Take 40 mg by mouth every morning. )  . rosuvastatin (CRESTOR) 10 MG tablet Take 2 tablets (20 mg total) by mouth daily.  Marland Kitchen  traZODone (DESYREL) 100 MG tablet Take 300 mg by mouth at bedtime.    No current facility-administered medications on file prior to visit.      Allergies  Allergen Reactions  . Ace Inhibitors Cough  . Chantix [Varenicline] Nausea And Vomiting  . Lipitor [Atorvastatin] Other (See Comments)    MYALGIA     Review Of Systems:  Constitutional:   +  weight loss, + night sweats,  Fevers, chills,+ fatigue, or  lassitude.  HEENT:   + headaches,  Difficulty swallowing,  Tooth/dental problems, or  Sore throat,                No sneezing, itching, ear ache, nasal congestion, + post nasal drip,   CV:  No chest pain,  Orthopnea, PND, swelling in lower extremities, anasarca, dizziness, palpitations, syncope.   GI  No heartburn, indigestion, abdominal pain, nausea, vomiting, diarrhea, change in bowel habits,+  loss of appetite, No bloody stools.   Resp: + shortness of breath with exertion less at rest.  + excess mucus, + productive cough,  No non-productive cough,  No coughing up of blood.  No change in color of mucus.  + wheezing.  No chest wall deformity  Skin: no rash or lesions.  GU: no dysuria, change in color of urine, no urgency or frequency.  No flank pain, no hematuria   MS:  No joint pain or swelling.  No decreased range of motion.  No back pain.  Psych:  No change in mood or affect. No depression or anxiety.  No memory loss.   Vital Signs BP 122/80 (BP Location: Left Arm, Cuff Size: Normal)   Pulse 65   Ht 5' 7"  (1.702 m)   Wt 165 lb 3.2 oz (74.9 kg)   SpO2 98%   BMI 25.87 kg/m    Physical Exam:  General- No distress,  A&Ox3, pleasant ENT: No sinus tenderness, TM clear, pale nasal mucosa, no oral exudate,no post nasal drip, no LAN Cardiac: S1, S2, regular rate and rhythm, no murmur Chest: No wheeze/ rales/ dullness; no accessory muscle use, no nasal flaring, no sternal retractions, few rhonchi which clear with cough Abd.: Soft Non-tender, ND, BS + Ext: No clubbing cyanosis, edema Neuro:  normal strength, MAE x 4, alert and oriented x 3 Skin: No rashes or lesions , warm and dry Psych: normal mood and behavior   Assessment/Plan  Reactive airway disease States she is compliant "at times" with her Symbicort Plan: Continue to use your Symbicort 2 puffs twice daily  Rinse mouth after use. We will give you a sample today. Please work on quitting smoking as we discussed. This is the single most powerful action you can take to decrease your risk of lung cancer, heart disease and stroke.   TOBACCO ABUSE Continues to smoke 1/2 PPD.  Unintentional weight loss of 30 pounds Plan: CT Chest I have spent 3 minutes counseling patient on smoking cessation this visit. Patient verbalizes understanding of their  choice to continue smoking and the negative health consequences including worsening of COPD, risk of lung cancer , stroke and heart disease.    Unintentional weight loss Weight loss ( 30 pounds over 6-8 months)  in an every day smoker with a 20 pack year smoking history. She does not meet 30 pack year requirement for Lung Cancer Screening program Night sweats ( She went through menopause 20 years ago) Slightly Elevated Calcium last BMET Plan: We will schedule a CT Chest without contrast.05/25/2019 at 10:30  am We have arranged for your transportation. We will call you with results We will try to get it ordered soon as possible. CBC with diff CMET We will call you with results Follow up  after CT chest is completed. Please contact office for sooner follow up if symptoms do not improve or worsen or seek emergency care       Magdalen Spatz, NP 05/22/2019  11:16 AM

## 2019-05-22 NOTE — Assessment & Plan Note (Signed)
States she is compliant "at times" with her Symbicort Plan: Continue to use your Symbicort 2 puffs twice daily  Rinse mouth after use. We will give you a sample today. Please work on quitting smoking as we discussed. This is the single most powerful action you can take to decrease your risk of lung cancer, heart disease and stroke.

## 2019-05-22 NOTE — Assessment & Plan Note (Addendum)
Weight loss ( 30 pounds over 6-8 months)  in an every day smoker with a 20 pack year smoking history. She does not meet 30 pack year requirement for Lung Cancer Screening program Night sweats ( She went through menopause 20 years ago) Slightly Elevated Calcium last BMET Plan: We will schedule a CT Chest without contrast.05/25/2019 at 10:30 am We have arranged for your transportation. We will call you with results We will try to get it ordered soon as possible. CBC with diff CMET We will call you with results Follow up after CT chest is completed. Please contact office for sooner follow up if symptoms do not improve or worsen or seek emergency care

## 2019-05-22 NOTE — Assessment & Plan Note (Signed)
Continues to smoke 1/2 PPD.  Unintentional weight loss of 30 pounds Plan: CT Chest I have spent 3 minutes counseling patient on smoking cessation this visit. Patient verbalizes understanding of their  choice to continue smoking and the negative health consequences including worsening of COPD, risk of lung cancer , stroke and heart disease.

## 2019-05-24 ENCOUNTER — Telehealth: Payer: Self-pay | Admitting: *Deleted

## 2019-05-24 NOTE — Telephone Encounter (Signed)

## 2019-05-24 NOTE — Telephone Encounter (Signed)
COVID Screening not done no voicemail set up.

## 2019-05-25 ENCOUNTER — Ambulatory Visit (INDEPENDENT_AMBULATORY_CARE_PROVIDER_SITE_OTHER)
Admission: RE | Admit: 2019-05-25 | Discharge: 2019-05-25 | Disposition: A | Discharge status: Home / Self Care | Payer: Medicare Other | Source: Ambulatory Visit | Attending: Acute Care | Admitting: Acute Care

## 2019-05-25 ENCOUNTER — Other Ambulatory Visit: Payer: Self-pay

## 2019-05-25 DIAGNOSIS — Z720 Tobacco use: Secondary | ICD-10-CM | POA: Diagnosis not present

## 2019-05-25 DIAGNOSIS — R05 Cough: Secondary | ICD-10-CM

## 2019-05-25 DIAGNOSIS — R5383 Other fatigue: Secondary | ICD-10-CM

## 2019-05-25 DIAGNOSIS — R634 Abnormal weight loss: Secondary | ICD-10-CM

## 2019-05-25 DIAGNOSIS — R0602 Shortness of breath: Secondary | ICD-10-CM | POA: Diagnosis not present

## 2019-06-14 ENCOUNTER — Other Ambulatory Visit: Payer: Self-pay

## 2019-06-14 ENCOUNTER — Other Ambulatory Visit: Payer: Self-pay | Admitting: Podiatry

## 2019-06-14 ENCOUNTER — Ambulatory Visit (INDEPENDENT_AMBULATORY_CARE_PROVIDER_SITE_OTHER): Payer: Medicare Other | Admitting: Podiatry

## 2019-06-14 ENCOUNTER — Ambulatory Visit (INDEPENDENT_AMBULATORY_CARE_PROVIDER_SITE_OTHER): Payer: Medicare Other

## 2019-06-14 DIAGNOSIS — E1151 Type 2 diabetes mellitus with diabetic peripheral angiopathy without gangrene: Secondary | ICD-10-CM | POA: Diagnosis not present

## 2019-06-14 DIAGNOSIS — M79672 Pain in left foot: Secondary | ICD-10-CM

## 2019-06-14 DIAGNOSIS — L84 Corns and callosities: Secondary | ICD-10-CM

## 2019-06-14 DIAGNOSIS — M19072 Primary osteoarthritis, left ankle and foot: Secondary | ICD-10-CM | POA: Diagnosis not present

## 2019-06-14 DIAGNOSIS — M19079 Primary osteoarthritis, unspecified ankle and foot: Secondary | ICD-10-CM

## 2019-06-14 DIAGNOSIS — M205X2 Other deformities of toe(s) (acquired), left foot: Secondary | ICD-10-CM

## 2019-06-14 DIAGNOSIS — M7752 Other enthesopathy of left foot: Secondary | ICD-10-CM | POA: Diagnosis not present

## 2019-06-14 NOTE — Progress Notes (Signed)
Subjective:  Patient ID: Susan Holmes, female    DOB: 27-Feb-1956,  MRN: [id number]  Chief Complaint  Patient presents with  . Plantar Warts    Pt states bilateral plantar warts  . Toe Pain    Pt states left 1st and 3rd digit are swelling/bruised/painful, no known cause.    63 y.o. female presents with the above complaint. Calluses to both feet. Sugars not very well controlled, last A1c 10.6.   Reports pain and swelling to the big to joint of the left big toe joint.   Review of Systems: Negative except as noted in the HPI. Denies N/V/F/Ch.  Past Medical History:  Diagnosis Date  . Anxiety   . Arthritis   . Chest pain 09/26/2018  . Congenital blindness    Left  eye  . Coronary artery disease    per cardiac cath 2015-- mild disease involving LAD and branches ,  LCFx and branches, and mild disease pRCA  . Depression   . Diabetes mellitus, type II ([place])    followed by pcp  . Glaucoma   . Glaucoma, both eyes   . Hepatitis B infection pt unsure   resolved  . History of transient ischemic attack (TIA) 2017   per pt no residuals  . HTN (hypertension)    followed by pcp  . Hyperlipidemia   . Poor dental hygiene   . Renal calculus, right   . Stroke ([place])   . Tobacco abuse    .5 PPD smoker    Current Outpatient Medications:  .  ACCU-CHEK FASTCLIX LANCETS MISC, CHECK BLOOD SUGAR THREE TIMES DAILY AS DIRECTED, Disp: 306 each, Rfl: 5 .  amLODipine (NORVASC) 10 MG tablet, Take 0.5 tablets (5 mg total) by mouth every morning., Disp: 45 tablet, Rfl: 2 .  amLODipine (NORVASC) 5 MG tablet, TK 1 T PO D, Disp: , Rfl:  .  amoxicillin (AMOXIL) 500 MG capsule, TK 1 C PO Q 8 H TAT, Disp: , Rfl:  .  aspirin EC 81 MG tablet, Take 81 mg by mouth daily., Disp: , Rfl:  .  Blood Glucose Monitoring Suppl (ACCU-CHEK GUIDE) w/Device KIT, 1 each by Does not apply route 3 (three) times daily., Disp: 1 kit, Rfl: 0 .  budesonide-formoterol (SYMBICORT) 160-4.5 MCG/ACT inhaler, Inhale 2 puffs into the  lungs 2 (two) times daily as needed., Disp: 2 Inhaler, Rfl: 0 .  carvedilol (COREG) 12.5 MG tablet, TAKE 1 TABLET(12.5 MG) BY MOUTH TWICE DAILY, Disp: 60 tablet, Rfl: 6 .  cyproheptadine (PERIACTIN) 4 MG tablet, TK 1 T PO BID, Disp: , Rfl:  .  docusate sodium (COLACE) 100 MG capsule, Take 1 capsule (100 mg total) by mouth 2 (two) times daily., Disp: 10 capsule, Rfl: 0 .  dorzolamide-timolol (COSOPT) 22.3-6.8 MG/ML ophthalmic solution, Place 1 drop into both eyes 2 (two) times daily. , Disp: , Rfl:  .  Dulaglutide (TRULICITY) 1.5 GE/3.6OQ SOPN, Inject 1.5 mg into the skin once a week., Disp: 2 mL, Rfl: 2 .  glucose blood (ONE TOUCH ULTRA TEST) test strip, USE TO TEST BLOOD GLUCOSE 3 TIMES A DAY. Diagnosis code: E11.41, Disp: 100 each, Rfl: 11 .  hydrOXYzine (ATARAX/VISTARIL) 25 MG tablet, TK 1 T PO TID PRN, Disp: , Rfl:  .  ibuprofen (ADVIL) 800 MG tablet, TK 1 T PO QID PRN, Disp: , Rfl:  .  Insulin Glargine (LANTUS SOLOSTAR) 100 UNIT/ML Solostar Pen, Inject 24 Units into the skin at bedtime., Disp: 5 pen, Rfl: 5 .  Insulin Pen Needle (VALUMARK PEN NEEDLES) 31G X 8 MM MISC, 1 each by Does not apply route daily., Disp: 100 each, Rfl: 3 .  Loperamide HCl 1 MG/7.5ML LIQD, Take 30 mLs (4 mg total) by mouth 3 (three) times daily as needed., Disp: 150 mL, Rfl: 0 .  losartan (COZAAR) 100 MG tablet, Take 1 tablet (100 mg total) by mouth daily. (Patient taking differently: Take 100 mg by mouth every morning. ), Disp: 90 tablet, Rfl: 3 .  mirtazapine (REMERON) 45 MG tablet, Take 45 mg by mouth at bedtime., Disp: , Rfl:  .  Multiple Vitamins-Minerals (CENTRUM SILVER PO), Take 1 tablet by mouth daily., Disp: , Rfl:  .  oxyCODONE-acetaminophen (PERCOCET) 5-325 MG tablet, Take 1 tablet by mouth every 6 (six) hours as needed for severe pain., Disp: 12 tablet, Rfl: 0 .  pantoprazole (PROTONIX) 40 MG tablet, TAKE 1 TABLET BY MOUTH DAILY (Patient taking differently: Take 40 mg by mouth every morning. ), Disp: 90  tablet, Rfl: 0 .  rosuvastatin (CRESTOR) 10 MG tablet, Take 2 tablets (20 mg total) by mouth daily., Disp: 90 tablet, Rfl: 1 .  traZODone (DESYREL) 100 MG tablet, Take 300 mg by mouth at bedtime. , Disp: , Rfl:   Social History   Tobacco Use  Smoking Status Current Every Day Smoker  . Packs/day: 0.50  . Years: 39.00  . Pack years: 19.50  . Types: Cigarettes  . Start date: 4  Smokeless Tobacco Current User  . Types: Chew  Tobacco Comment   < half pack per day    Allergies  Allergen Reactions  . Ace Inhibitors Cough  . Chantix [Varenicline] Nausea And Vomiting  . Lipitor [Atorvastatin] Other (See Comments)    MYALGIA    Objective:  There were no vitals filed for this visit. There is no height or weight on file to calculate BMI. Constitutional Well developed. Well nourished.  Vascular Dorsalis pedis pulses palpable bilaterally. Posterior tibial pulses non-palpable bilaterally. Capillary refill normal to all digits.  No cyanosis or clubbing noted. Pedal hair growth normal.  Neurologic Normal speech. Oriented to person, place, and time. Epicritic sensation to light touch grossly present bilaterally.  Dermatologic Nails bilateral hallux s/p removal but with residual spicules. No open wounds. HPK submet 3 left, 2 right  Orthopedic: POP and crepitus 1st MPJ left Hammertoes bilat feet   Radiographs: Taken and reviewed subchondral sclerosis and decreased joint space 1st MPJ no acute fractures or dislcoations. Assessment:   1. Arthritis of metatarsophalangeal (MTP) joint of great toe   2. Hallux limitus of left foot   3. Capsulitis of metatarsophalangeal (MTP) joint of left foot   4. Diabetes mellitus type 2 with peripheral artery disease (HCC)   5. Callus    Plan:  Patient was evaluated and treated and all questions answered.  Arthritis MPJ Left -XR reviewed -Educated on etiology -Injection as below  Procedure: Joint Injection Location: Left 1st MPJ joint  Skin Prep: Alcohol. Injectate: 0.5 cc 1% lidocaine plain, 0.5 cc dexamethasone phosphate. Disposition: Patient tolerated procedure well. Injection site dressed with a band-aid.  DM with PAD, Calluses, HT formation -Would benefit from DM shoes -Calluses debrided x2  Procedure: Paring of Lesion Rationale: painful hyperkeratotic lesion Type of Debridement: manual, sharp debridement. Instrumentation: 312 blade Number of Lesions: 2    No follow-ups on file.

## 2019-06-17 ENCOUNTER — Encounter: Payer: Self-pay | Admitting: *Deleted

## 2019-06-21 ENCOUNTER — Encounter: Payer: Self-pay | Admitting: Internal Medicine

## 2019-06-21 ENCOUNTER — Other Ambulatory Visit: Payer: Self-pay

## 2019-06-21 ENCOUNTER — Ambulatory Visit (INDEPENDENT_AMBULATORY_CARE_PROVIDER_SITE_OTHER): Payer: Medicare Other | Admitting: Internal Medicine

## 2019-06-21 ENCOUNTER — Other Ambulatory Visit: Payer: Medicare Other | Admitting: Orthotics

## 2019-06-21 DIAGNOSIS — M1712 Unilateral primary osteoarthritis, left knee: Secondary | ICD-10-CM | POA: Diagnosis not present

## 2019-06-21 DIAGNOSIS — E119 Type 2 diabetes mellitus without complications: Secondary | ICD-10-CM | POA: Diagnosis not present

## 2019-06-21 DIAGNOSIS — L03032 Cellulitis of left toe: Secondary | ICD-10-CM | POA: Diagnosis not present

## 2019-06-21 DIAGNOSIS — I10 Essential (primary) hypertension: Secondary | ICD-10-CM | POA: Diagnosis not present

## 2019-06-21 DIAGNOSIS — M170 Bilateral primary osteoarthritis of knee: Secondary | ICD-10-CM

## 2019-06-21 HISTORY — DX: Cellulitis of left toe: L03.032

## 2019-06-21 MED ORDER — CEPHALEXIN 500 MG PO CAPS: 500.0000 mg | ORAL_CAPSULE | Freq: Four times a day (QID) | ORAL | 0 refills | Status: AC

## 2019-06-21 MED ORDER — DICLOFENAC SODIUM 1 % TD GEL: 4.0000 g | Freq: Four times a day (QID) | TRANSDERMAL | 1 refills | Status: DC

## 2019-06-21 NOTE — Patient Instructions (Addendum)
Susan Holmes, It was nice meeting you!  Today we discussed: Your toe pain: There appears to be a small infection surrounding your nailbed. I'd like you to take an antibiotic for the next 5 days. If it has not improved after treatment, please call.   Your knee pain is likely due to arthritis. You can apply the topical anti-inflammatory medication I have sent in. I would also recommend taking Ibuprofen 800 mg three times daily for 2 weeks to get the inflammation down. Some patients also find icing the joint to be helpful for 10-20 minutes.   Hope you feel better soon!   Dr. Koleen Distance  Osteoarthritis  Osteoarthritis is a type of arthritis that affects tissue that covers the ends of bones in joints (cartilage). Cartilage acts as a cushion between the bones and helps them move smoothly. Osteoarthritis results when cartilage in the joints gets worn down. Osteoarthritis is sometimes called "wear and tear" arthritis. Osteoarthritis is the most common form of arthritis. It often occurs in older people. It is a condition that gets worse over time (a progressive condition). Joints that are most often affected by this condition are in:  Fingers.  Toes.  Hips.  Knees.  Spine, including neck and lower back. What are the causes? This condition is caused by age-related wearing down of cartilage that covers the ends of bones. What increases the risk? The following factors may make you more likely to develop this condition:  Older age.  Being overweight or obese.  Overuse of joints, such as in athletes.  Past injury of a joint.  Past surgery on a joint.  Family history of osteoarthritis. What are the signs or symptoms? The main symptoms of this condition are pain, swelling, and stiffness in the joint. The joint may lose its shape over time. Small pieces of bone or cartilage may break off and float inside of the joint, which may cause more pain and damage to the joint. Small deposits of bone  (osteophytes) may grow on the edges of the joint. Other symptoms may include:  A grating or scraping feeling inside the joint when you move it.  Popping or creaking sounds when you move. Symptoms may affect one or more joints. Osteoarthritis in a major joint, such as your knee or hip, can make it painful to walk or exercise. If you have osteoarthritis in your hands, you might not be able to grip items, twist your hand, or control small movements of your hands and fingers (fine motor skills). How is this diagnosed? This condition may be diagnosed based on:  Your medical history.  A physical exam.  Your symptoms.  X-rays of the affected joint(s).  Blood tests to rule out other types of arthritis. How is this treated? There is no cure for this condition, but treatment can help to control pain and improve joint function. Treatment plans may include:  A prescribed exercise program that allows for rest and joint relief. You may work with a physical therapist.  A weight control plan.  Pain relief techniques, such as: ? Applying heat and cold to the joint. ? Electric pulses delivered to nerve endings under the skin (transcutaneous electrical nerve stimulation, or TENS). ? Massage. ? Certain nutritional supplements.  NSAIDs or prescription medicines to help relieve pain.  Medicine to help relieve pain and inflammation (corticosteroids). This can be given by mouth (orally) or as an injection.  Assistive devices, such as a brace, wrap, splint, specialized glove, or cane.  Surgery, such as: ?  An osteotomy. This is done to reposition the bones and relieve pain or to remove loose pieces of bone and cartilage. ? Joint replacement surgery. You may need this surgery if you have very bad (advanced) osteoarthritis. Follow these instructions at home: Activity  Rest your affected joints as directed by your health care provider.  Do not drive or use heavy machinery while taking prescription  pain medicine.  Exercise as directed. Your health care provider or physical therapist may recommend specific types of exercise, such as: ? Strengthening exercises. These are done to strengthen the muscles that support joints that are affected by arthritis. They can be performed with weights or with exercise bands to add resistance. ? Aerobic activities. These are exercises, such as brisk walking or water aerobics, that get your heart pumping. ? Range-of-motion activities. These keep your joints easy to move. ? Balance and agility exercises. Managing pain, stiffness, and swelling      If directed, apply heat to the affected area as often as told by your health care provider. Use the heat source that your health care provider recommends, such as a moist heat pack or a heating pad. ? If you have a removable assistive device, remove it as told by your health care provider. ? Place a towel between your skin and the heat source. If your health care provider tells you to keep the assistive device on while you apply heat, place a towel between the assistive device and the heat source. ? Leave the heat on for 20-30 minutes. ? Remove the heat if your skin turns bright red. This is especially important if you are unable to feel pain, heat, or cold. You may have a greater risk of getting burned.  If directed, put ice on the affected joint: ? If you have a removable assistive device, remove it as told by your health care provider. ? Put ice in a plastic bag. ? Place a towel between your skin and the bag. If your health care provider tells you to keep the assistive device on during icing, place a towel between the assistive device and the bag. ? Leave the ice on for 20 minutes, 2-3 times a day. General instructions  Take over-the-counter and prescription medicines only as told by your health care provider.  Maintain a healthy weight. Follow instructions from your health care provider for weight control.  These may include dietary restrictions.  Do not use any products that contain nicotine or tobacco, such as cigarettes and e-cigarettes. These can delay bone healing. If you need help quitting, ask your health care provider.  Use assistive devices as directed by your health care provider.  Keep all follow-up visits as told by your health care provider. This is important. Where to find more information  [employer] of Arthritis and Musculoskeletal and Skin Diseases: www.niams.SouthExposed.es  [employer] on Aging: http://kim-miller.com/  American College of Rheumatology: www.rheumatology.org Contact a health care provider if:  Your skin turns red.  You develop a rash.  You have pain that gets worse.  You have a fever along with joint or muscle aches. Get help right away if:  You lose a lot of weight.  You suddenly lose your appetite.  You have night sweats. Summary  Osteoarthritis is a type of arthritis that affects tissue covering the ends of bones in joints (cartilage).  This condition is caused by age-related wearing down of cartilage that covers the ends of bones.  The main symptom of this condition is pain,  swelling, and stiffness in the joint.  There is no cure for this condition, but treatment can help to control pain and improve joint function. This information is not intended to replace advice given to you by your health care provider. Make sure you discuss any questions you have with your health care provider. Document Released: 12/13/2005 Document Revised: 09/19/2017 Document Reviewed: 08/16/2016 Elsevier Interactive Patient Education  2019 [employer].

## 2019-06-21 NOTE — Assessment & Plan Note (Signed)
Patient likely experiencing flare in left knee due to altered gait from toe pain. Will treat with topical Voltaren gel 4x daily and PO NSAIDs.

## 2019-06-21 NOTE — Progress Notes (Signed)
   CC: toe pain, knee pain   HPI:  [name] is a 63 y.o. female with history type II DM, HTN who presents for left toe and knee pain.   Her left great toe pain began approximately 3 months ago. She denies any injury or trauma. Describes it as a constant throb. Localized to the tip of the toe surrounding her nail bed. No skin breakdown, bleeding or drainage. No fevers, chills, nausea, vomiting.   Her left knee pain also began around the same time. Pain is worse with movement. No swelling, numbness/tingling. She has been taking Tylenol and aspirin without relief.   Past Medical History:  Diagnosis Date  . Anxiety   . Arthritis   . Chest pain 09/26/2018  . Congenital blindness    Left  eye  . Coronary artery disease    per cardiac cath 2015-- mild disease involving LAD and branches ,  LCFx and branches, and mild disease pRCA  . Depression   . Diabetes mellitus, type II ([place])    followed by pcp  . Glaucoma   . Glaucoma, both eyes   . Hepatitis B infection pt unsure   resolved  . History of transient ischemic attack (TIA) 2017   per pt no residuals  . HTN (hypertension)    followed by pcp  . Hyperlipidemia   . Poor dental hygiene   . Renal calculus, right   . Stroke ([place])   . Tobacco abuse    .5 PPD smoker   Review of Systems:  Review of Systems  All other systems reviewed and are negative.   Physical Exam:  Vitals:   06/21/19 0917 06/21/19 0918  BP:  (!) 141/81  Pulse:  66  Temp:  97.9 F (36.6 C)  TempSrc:  Oral  SpO2:  100%  Weight: 160 lb 3.2 oz (72.7 kg)   Height: 5\' 7"  (1.702 m)    Physical Exam Constitutional:      General: She is not in acute distress.    Appearance: Normal appearance.  Musculoskeletal:     Left knee: She exhibits decreased range of motion (crepitus noted). Tenderness found. Medial joint line and lateral joint line tenderness noted.  Feet:     Comments: Left great toe with erythema, warmth and tenderness around nail bed. No  drainage or bleeding.  Neurological:     Mental Status: She is alert.  Psychiatric:        Mood and Affect: Mood normal.        Behavior: Behavior normal.      Assessment & Plan:   See Encounters Tab for problem based charting.  Patient discussed with Dr. Daryll Drown

## 2019-06-21 NOTE — Assessment & Plan Note (Signed)
Patient presents with 3 months of progressive great toe pain described as a constant throb. Recently received steroid injection into same toe to treat MPJ arthritis. Patient notes skin around toenail was red and tender prior to injection. No drainage or systemic signs of infection.  Will treat with 5 day course of Keflex. Patient instructed to call if she does not notice any improvement with therapy.

## 2019-06-28 ENCOUNTER — Other Ambulatory Visit: Payer: Self-pay | Admitting: Cardiovascular Disease

## 2019-06-28 ENCOUNTER — Other Ambulatory Visit: Payer: Self-pay

## 2019-06-28 ENCOUNTER — Ambulatory Visit: Payer: Medicare Other | Admitting: Orthotics

## 2019-06-28 DIAGNOSIS — M19079 Primary osteoarthritis, unspecified ankle and foot: Secondary | ICD-10-CM

## 2019-06-28 DIAGNOSIS — M7752 Other enthesopathy of left foot: Secondary | ICD-10-CM

## 2019-06-28 DIAGNOSIS — M205X2 Other deformities of toe(s) (acquired), left foot: Secondary | ICD-10-CM

## 2019-06-28 NOTE — Progress Notes (Signed)
Patient left frustrated as she wasn't aware Medicare wouldn't cover [place]; I told her Medicare would cover DBS/inserts; however she would still have a $80 co-pay since secondary Medicaid wouldn't cover.  She wasn't happy.

## 2019-07-01 NOTE — Progress Notes (Signed)
Internal Medicine Clinic Attending  Case discussed with Dr. Bloomfield at the time of the visit.  We reviewed the resident's history and exam and pertinent patient test results.  I agree with the assessment, diagnosis, and plan of care documented in the resident's note.  

## 2019-07-02 ENCOUNTER — Telehealth: Payer: Self-pay | Admitting: Internal Medicine

## 2019-07-02 NOTE — Telephone Encounter (Signed)
Pt is calling to let the physician know her toe isn't getting no better, pls contact [phone]

## 2019-07-02 NOTE — Telephone Encounter (Signed)
rtc to pt, unable to leave messages and did not answer

## 2019-07-12 ENCOUNTER — Encounter: Payer: Self-pay | Admitting: Internal Medicine

## 2019-07-12 ENCOUNTER — Ambulatory Visit (INDEPENDENT_AMBULATORY_CARE_PROVIDER_SITE_OTHER): Payer: Medicare Other | Admitting: Internal Medicine

## 2019-07-12 ENCOUNTER — Other Ambulatory Visit: Payer: Self-pay

## 2019-07-12 VITALS — BP 108/88 | HR 75 | Temp 97.9°F | Wt 159.6 lb

## 2019-07-12 DIAGNOSIS — Z7982 Long term (current) use of aspirin: Secondary | ICD-10-CM

## 2019-07-12 DIAGNOSIS — R634 Abnormal weight loss: Secondary | ICD-10-CM

## 2019-07-12 DIAGNOSIS — M79675 Pain in left toe(s): Secondary | ICD-10-CM | POA: Diagnosis not present

## 2019-07-12 DIAGNOSIS — E119 Type 2 diabetes mellitus without complications: Secondary | ICD-10-CM

## 2019-07-12 DIAGNOSIS — Z79899 Other long term (current) drug therapy: Secondary | ICD-10-CM

## 2019-07-12 DIAGNOSIS — M1990 Unspecified osteoarthritis, unspecified site: Secondary | ICD-10-CM

## 2019-07-12 DIAGNOSIS — L03032 Cellulitis of left toe: Secondary | ICD-10-CM | POA: Diagnosis not present

## 2019-07-12 DIAGNOSIS — E1165 Type 2 diabetes mellitus with hyperglycemia: Secondary | ICD-10-CM

## 2019-07-12 DIAGNOSIS — Z87891 Personal history of nicotine dependence: Secondary | ICD-10-CM

## 2019-07-12 DIAGNOSIS — Z6825 Body mass index (BMI) 25.0-25.9, adult: Secondary | ICD-10-CM

## 2019-07-12 DIAGNOSIS — I10 Essential (primary) hypertension: Secondary | ICD-10-CM | POA: Diagnosis not present

## 2019-07-12 DIAGNOSIS — M79672 Pain in left foot: Secondary | ICD-10-CM

## 2019-07-12 NOTE — Progress Notes (Signed)
   CC: left great toe pain   HPI:  [name] is a 63 y.o. female w/Hx of Type II DM, HTN and arthritis presenting to clinic for continued pain of her left great toe. She reports pain as constant throbbing. She was seen for this in clinic on 6/25 and found to have paronychia of this toe and was given 5 day course of Keflex. Patient reports that she does not have relief with therapy. She denies any fever, chills, or drainage from toe.   Past Medical History:  Diagnosis Date  . Anxiety   . Arthritis   . Chest pain 09/26/2018  . Congenital blindness    Left  eye  . Coronary artery disease    per cardiac cath 2015-- mild disease involving LAD and branches ,  LCFx and branches, and mild disease pRCA  . Depression   . Diabetes mellitus, type II ([place])    followed by pcp  . Glaucoma   . Glaucoma, both eyes   . Hepatitis B infection pt unsure   resolved  . History of transient ischemic attack (TIA) 2017   per pt no residuals  . HTN (hypertension)    followed by pcp  . Hyperlipidemia   . Poor dental hygiene   . Renal calculus, right   . Stroke ([place])   . Tobacco abuse    .5 PPD smoker   Review of Systems:  Review of Systems  Constitutional: Positive for malaise/fatigue and weight loss. Negative for chills and fever.  HENT: Negative for congestion and sore throat.   Eyes: Negative for blurred vision and double vision.  Respiratory: Negative for cough, hemoptysis, sputum production, shortness of breath and wheezing.   Cardiovascular: Negative for chest pain, palpitations, claudication and leg swelling.  Gastrointestinal: Negative for abdominal pain, blood in stool, constipation, diarrhea, nausea and vomiting.  Musculoskeletal: Positive for joint pain. Negative for myalgias.  Neurological: Negative for dizziness, sensory change, focal weakness and headaches.  Endo/Heme/Allergies: Does not bruise/bleed easily.     Physical Exam:  Vitals:   07/12/19 1030  BP: 108/88   Pulse: 75  Temp: 97.9 F (36.6 C)  TempSrc: Oral  SpO2: 100%  Weight: 159 lb 9.6 oz (72.4 kg)   Physical Exam  Constitutional: She is oriented to person, place, and time and well-developed, well-nourished, and in no distress.  HENT:  Head: Normocephalic and atraumatic.  Eyes: Conjunctivae are normal.  Neck: Normal range of motion. Neck supple.  Cardiovascular: Normal rate, regular rhythm, normal heart sounds and intact distal pulses. Exam reveals no gallop and no friction rub.  No murmur heard. Pulmonary/Chest: Effort normal and breath sounds normal. No respiratory distress. She has no wheezes. She has no rales.  Abdominal: Soft. Bowel sounds are normal.  Musculoskeletal: Normal range of motion.        General: Tenderness present.     Comments: Tenderness elicited at the tip/pulp of the left great toe. Tenderness also present at the first MTP joint with a protrusion of the bone at the lateral aspect of the first MTP joint.   Lymphadenopathy:    She has no cervical adenopathy.  Neurological: She is alert and oriented to person, place, and time.  Skin: Skin is warm and dry.     Assessment & Plan:   See Encounters Tab for problem based charting.  Patient seen with Dr. Evette Doffing

## 2019-07-12 NOTE — Assessment & Plan Note (Signed)
Patient w/Hx of Type II DM, HTN and ~10 pack year smoking history presenting with continued left great toe pain that she reports as constant throbbing. She was previously examined and determined to have paronychia of left great toe and was prescribed 5 day course of Keflex with minimal relief. Of note, patient also has arthritis in 1st MTP of left foot and received a steroid injection into the MPJ joint around time of initial presentation.  On exam, tenderness to palpation present at tip/pulp of left great toe. No erythema or warmth noted at or around the nail bed. Tenderness also present at first MTP joint w/protrusion of bone at lateral aspect of this joint consistent with bunion development. No drainage or systemic signs of infection noted.  ABI - undetectable/severely reduced blood flow to left foot  - Referral to vascular surgery

## 2019-07-12 NOTE — Patient Instructions (Addendum)
Ms. Dezarn,  It was a pleasure seeing you in clinic today. Today we discussed your left big toe pain.   - The paronychia of your left great toe seems to be improved. - We conducted an Ankle-Brachial Index today. Based on the results, we believe that you would benefit from further investigation with a vascular surgeon.  - Please follow up with PCP for your other medical conditions.  - Please contact us for any concerns.  Thank you!

## 2019-07-12 NOTE — Assessment & Plan Note (Signed)
Patient with ~10pack year smoking history reports a 30lb unintentional weight loss over 6-8 month period and increasing fatigue. She was evaluated with CT chest that showed no evidence of malignancy. On chart review, patient weight has been stable over past year. Patient denies fever, chills. Given absence of systemic signs, stable weight for one year, and negative CT results, pulmonary malignancy less likely. Will continue to monitor symptoms and reassess at next visit.

## 2019-07-13 NOTE — Progress Notes (Signed)
Internal Medicine Clinic Attending  I saw and evaluated the patient.  I personally confirmed the key portions of the history and exam documented by Dr. Marva Panda and I reviewed pertinent patient test results.  The assessment, diagnosis, and plan were formulated together and I agree with the documentation in the resident's note.  Left toe pain is persistent problem. There is arthritis at the first MTP joint, but pain is more distal. There is no sign of paronychia or pulp infection today. Foot is warm, DP pulse is faint. No symptoms of claudication. In office ABI normal on the right, but our machine could not detect any BP signal on the left. Given her history of tobacco use and diabetes, she is at risk for both micro and large vessel PAD. Will refer to vascular surgery for duplex exam. Continue with aspirin and crestor for medical management for now.

## 2019-07-13 NOTE — Addendum Note (Signed)
Addended by: Lalla Brothers T on: 07/13/2019 10:18 AM   Modules accepted: Level of Service

## 2019-07-24 ENCOUNTER — Other Ambulatory Visit: Payer: Self-pay | Admitting: *Deleted

## 2019-07-24 DIAGNOSIS — E1165 Type 2 diabetes mellitus with hyperglycemia: Secondary | ICD-10-CM

## 2019-07-24 MED ORDER — TRULICITY 1.5 MG/0.5ML ~~LOC~~ SOAJ: 1.5000 mg | SUBCUTANEOUS | 2 refills | Status: DC

## 2019-08-17 ENCOUNTER — Other Ambulatory Visit: Payer: Self-pay

## 2019-08-17 NOTE — Patient Outreach (Signed)
[facility]) Care Management  08/17/2019  Susan Holmes 10/23/1956 AT:[id number]   Medication Adherence call to Susan Holmes Hipp[facility] Identifiers Verify spoke with patient she is past due on Rosuvastatin 10 mg patient explain she is taking 1 tablet daily and has 3 more tablets patient ask if we can call [facility] an order this medication,[facility] will have it ready for patient.Susan Holmes is showing past due under Susan Holmes.   [place] Management Direct Dial [phone]  Fax [phone] [email or web address]

## 2019-08-21 ENCOUNTER — Other Ambulatory Visit: Payer: Self-pay | Admitting: Internal Medicine

## 2019-08-21 DIAGNOSIS — E119 Type 2 diabetes mellitus without complications: Secondary | ICD-10-CM

## 2019-08-22 ENCOUNTER — Other Ambulatory Visit: Payer: Self-pay

## 2019-08-22 DIAGNOSIS — E1149 Type 2 diabetes mellitus with other diabetic neurological complication: Secondary | ICD-10-CM

## 2019-08-22 DIAGNOSIS — IMO0002 Reserved for concepts with insufficient information to code with codable children: Secondary | ICD-10-CM

## 2019-08-22 MED ORDER — VALUMARK PEN NEEDLES 31G X 8 MM MISC: 1.0000 | Freq: Every day | 3 refills | Status: DC

## 2019-08-22 NOTE — Telephone Encounter (Signed)
Insulin Pen Needle (VALUMARK PEN NEEDLES) 31G X 8 MM MISC   Refill request @  [place] [id number] [address], [place] [phone] (Phone) [phone] (Fax)

## 2019-08-31 ENCOUNTER — Other Ambulatory Visit: Payer: Self-pay

## 2019-08-31 DIAGNOSIS — I739 Peripheral vascular disease, unspecified: Secondary | ICD-10-CM

## 2019-09-05 ENCOUNTER — Telehealth (HOSPITAL_COMMUNITY): Payer: Self-pay

## 2019-09-05 NOTE — Telephone Encounter (Signed)

## 2019-09-06 ENCOUNTER — Other Ambulatory Visit: Payer: Self-pay | Admitting: *Deleted

## 2019-09-06 ENCOUNTER — Other Ambulatory Visit: Payer: Self-pay

## 2019-09-06 ENCOUNTER — Other Ambulatory Visit (HOSPITAL_COMMUNITY): Payer: Self-pay | Admitting: Vascular Surgery

## 2019-09-06 ENCOUNTER — Ambulatory Visit (INDEPENDENT_AMBULATORY_CARE_PROVIDER_SITE_OTHER): Payer: Medicare Other | Admitting: Vascular Surgery

## 2019-09-06 ENCOUNTER — Encounter: Payer: Self-pay | Admitting: Vascular Surgery

## 2019-09-06 ENCOUNTER — Ambulatory Visit (HOSPITAL_COMMUNITY)
Admission: RE | Admit: 2019-09-06 | Discharge: 2019-09-06 | Disposition: A | Discharge status: Home / Self Care | Payer: Medicare Other | Source: Ambulatory Visit | Attending: Family | Admitting: Family

## 2019-09-06 ENCOUNTER — Encounter: Payer: Self-pay | Admitting: *Deleted

## 2019-09-06 ENCOUNTER — Ambulatory Visit (INDEPENDENT_AMBULATORY_CARE_PROVIDER_SITE_OTHER)
Admission: RE | Admit: 2019-09-06 | Discharge: 2019-09-06 | Disposition: A | Discharge status: Home / Self Care | Payer: Medicare Other | Source: Ambulatory Visit | Attending: Vascular Surgery | Admitting: Vascular Surgery

## 2019-09-06 VITALS — BP 126/84 | HR 67 | Temp 97.2°F | Resp 20 | Ht 67.0 in | Wt 160.3 lb

## 2019-09-06 DIAGNOSIS — I739 Peripheral vascular disease, unspecified: Secondary | ICD-10-CM | POA: Diagnosis not present

## 2019-09-06 DIAGNOSIS — F17210 Nicotine dependence, cigarettes, uncomplicated: Secondary | ICD-10-CM

## 2019-09-06 NOTE — Progress Notes (Signed)
  Referring Physician: Dr Thomas Duncan  Patient name: Susan Holmes MRN: [id number] DOB: 03/14/1956 Sex: female  REASON FOR CONSULT:   HPI: Susan Holmes is a 63 y.o. female, a several year history of slowly progressive worsening of left leg pain when she ambulates.  She currently is only able to walk about 1 block before experiencing pain in her left leg.  She does not have rest pain.  She has no nonhealing wounds.  She also does have a history of diabetes.  She currently smokes about half a pack of cigarettes per day.  Greater than 3 minutes today spent regarding smoking cessation counseling.  I also discussed with her today the combination of diabetes and smoking increases risk of limb loss 100 fold.  Other medical problems include coronary artery disease, glaucoma, hypertension, hyperlipidemia all of which have been stable.  She is on aspirin and statin.  Past Medical History:  Diagnosis Date  . Anxiety   . Arthritis   . Chest pain 09/26/2018  . Congenital blindness    Left  eye  . Coronary artery disease    per cardiac cath 2015-- mild disease involving LAD and branches ,  LCFx and branches, and mild disease pRCA  . Depression   . Diabetes mellitus, type II (HCC)    followed by pcp  . Glaucoma   . Glaucoma, both eyes   . Hepatitis B infection pt unsure   resolved  . History of transient ischemic attack (TIA) 2017   per pt no residuals  . HTN (hypertension)    followed by pcp  . Hyperlipidemia   . Poor dental hygiene   . Renal calculus, right   . Stroke (HCC)   . Tobacco abuse    .5 PPD smoker   Past Surgical History:  Procedure Laterality Date  . ABDOMINAL HYSTERECTOMY  1980s   unsure if ovaries were taken  . GLAUCOMA SURGERY Right 2017  . IR URETERAL STENT RIGHT NEW ACCESS W/O SEP NEPHROSTOMY CATH  10/03/2018  . LEFT HEART CATHETERIZATION WITH CORONARY ANGIOGRAM N/A 10/09/2014   Procedure: LEFT HEART CATHETERIZATION WITH CORONARY ANGIOGRAM;  Surgeon: Thomas A  Kelly, MD;  Location: [facility] C[facility];  Service: Cardiovascular;  Laterality: N/A;  . NEPHROLITHOTOMY Right 10/03/2018   Procedure: RIGHT NEPHROLITHOTOMY PERCUTANEOUS FIRST STAGE;  Surgeon: Wrenn, John, MD;  Location: [facility] ORS;  Service: Urology;  Laterality: Right;  . TEE WITHOUT CARDIOVERSION N/A 08/17/2016   Procedure: TRANSESOPHAGEAL ECHOCARDIOGRAM (TEE);  Surgeon: Traci R Turner, MD;  Location: [facility] ENDOSCOPY;  Service: Cardiovascular;  Laterality: N/A;    Family History  Problem Relation Age of Onset  . Heart disease Mother 60  . Hypertension Mother   . CVA Mother   . Heart disease Father 60  . CVA Sister   . CVA Brother   . Aneurysm Brother   . Aneurysm Sister   . CVA Maternal Grandmother   . CVA Maternal Grandfather   . Colon cancer Neg Hx   . Esophageal cancer Neg Hx   . Stomach cancer Neg Hx     SOCIAL HISTORY: Social History   Socioeconomic History  . Marital status: Divorced    Spouse name: Not on file  . Number of children: 1  . Years of education: 12  . Highest education level: Not on file  Occupational History    Employer: UNEMPLOYED  Social Needs  . Financial resource strain: Not on file  . Food insecurity    Worry: Not   on file    Inability: Not on file  . Transportation needs    Medical: Not on file    Non-medical: Not on file  Tobacco Use  . Smoking status: Current Every Day Smoker    Packs/day: 0.50    Years: 39.00    Pack years: 19.50    Types: Cigarettes    Start date: 1981  . Smokeless tobacco: Current User    Types: Chew  . Tobacco comment: < half pack per day  Substance and Sexual Activity  . Alcohol use: No    Alcohol/week: 0.0 standard drinks  . Drug use: No  . Sexual activity: Not Currently    Birth control/protection: Surgical  Lifestyle  . Physical activity    Days per week: Not on file    Minutes per session: Not on file  . Stress: Not on file  Relationships  . Social connections    Talks on phone: Not on file    Gets together:  Not on file    Attends religious service: Not on file    Active member of club or organization: Not on file    Attends meetings of clubs or organizations: Not on file    Relationship status: Not on file  . Intimate partner violence    Fear of current or ex partner: Not on file    Emotionally abused: Not on file    Physically abused: Not on file    Forced sexual activity: Not on file  Other Topics Concern  . Not on file  Social History Narrative  . Not on file    Allergies  Allergen Reactions  . Ace Inhibitors Cough  . Chantix [Varenicline] Nausea And Vomiting  . Lipitor [Atorvastatin] Other (See Comments)    MYALGIA     Current Outpatient Medications  Medication Sig Dispense Refill  . ACCU-CHEK FASTCLIX LANCETS MISC CHECK BLOOD SUGAR THREE TIMES DAILY AS DIRECTED 306 each 5  . amLODipine (NORVASC) 10 MG tablet Take 0.5 tablets (5 mg total) by mouth every morning. 45 tablet 2  . aspirin EC 81 MG tablet Take 81 mg by mouth daily.    . Blood Glucose Monitoring Suppl (ACCU-CHEK GUIDE) w/Device KIT 1 each by Does not apply route 3 (three) times daily. 1 kit 0  . budesonide-formoterol (SYMBICORT) 160-4.5 MCG/ACT inhaler Inhale 2 puffs into the lungs 2 (two) times daily as needed. 2 Inhaler 0  . carvedilol (COREG) 12.5 MG tablet TAKE 1 TABLET(12.5 MG) BY MOUTH TWICE DAILY 60 tablet 6  . docusate sodium (COLACE) 100 MG capsule Take 1 capsule (100 mg total) by mouth 2 (two) times daily. 10 capsule 0  . dorzolamide-timolol (COSOPT) 22.3-6.8 MG/ML ophthalmic solution Place 1 drop into both eyes 2 (two) times daily.     . Dulaglutide (TRULICITY) 1.5 MG/0.5ML SOPN Inject 1.5 mg into the skin once a week. 4 pen 2  . DULoxetine (CYMBALTA) 30 MG capsule     . ibuprofen (ADVIL) 800 MG tablet TK 1 T PO QID PRN    . Insulin Glargine (LANTUS SOLOSTAR) 100 UNIT/ML Solostar Pen Inject 24 Units into the skin at bedtime. 5 pen 5  . Insulin Pen Needle (VALUMARK PEN NEEDLES) 31G X 8 MM MISC 1 each by  Does not apply route daily. 100 each 3  . Loperamide HCl 1 MG/7.5ML LIQD Take 30 mLs (4 mg total) by mouth 3 (three) times daily as needed. 150 mL 0  . mirtazapine (REMERON) 45 MG tablet Take 45 mg   by mouth at bedtime.    . Multiple Vitamins-Minerals (CENTRUM SILVER PO) Take 1 tablet by mouth daily.    . ONETOUCH ULTRA test strip USE TO TEST BLOOD GLUCOSE THREE TIMES DAILY 100 strip 12  . rosuvastatin (CRESTOR) 10 MG tablet Take 2 tablets (20 mg total) by mouth daily. 90 tablet 1  . traZODone (DESYREL) 100 MG tablet Take 300 mg by mouth at bedtime.     . losartan (COZAAR) 100 MG tablet Take 1 tablet (100 mg total) by mouth daily. (Patient taking differently: Take 100 mg by mouth every morning. ) 90 tablet 3   No current facility-administered medications for this visit.     ROS:   General:  No weight loss, Fever, chills  HEENT: No recent headaches, no nasal bleeding, no visual changes, no sore throat  Neurologic: No dizziness, blackouts, seizures. No recent symptoms of stroke or mini- stroke. No recent episodes of slurred speech, or temporary blindness.  Cardiac: No recent episodes of chest pain/pressure, no shortness of breath at rest.  No shortness of breath with exertion.  Denies history of atrial fibrillation or irregular heartbeat  Vascular: No history of rest pain in feet.  + history of claudication.  No history of non-healing ulcer, No history of DVT   Pulmonary: No home oxygen, no productive cough, no hemoptysis,  No asthma or wheezing  Musculoskeletal:  [X] Arthritis, [ ] Low back pain,  [X] Joint pain  Hematologic:No history of hypercoagulable state.  No history of easy bleeding.  No history of anemia  Gastrointestinal: No hematochezia or melena,  No gastroesophageal reflux, no trouble swallowing  Urinary: [ ] chronic Kidney disease, [ ] on HD - [ ] MWF or [ ] TTHS, [ ] Burning with urination, [ ] Frequent urination, [ ] Difficulty urinating;   Skin: No rashes   Psychological: No history of anxiety,  No history of depression   Physical Examination  Vitals:   09/06/19 1017  BP: 126/84  Pulse: 67  Resp: 20  Temp: (!) 97.2 F (36.2 C)  SpO2: 99%  Weight: 160 lb 4.8 oz (72.7 kg)  Height: 5' 7" (1.702 m)    Body mass index is 25.11 kg/m.  General:  Alert and oriented, no acute distress HEENT: Normal Neck: No JVD Cardiac: Regular Rate and Rhythm  Abdomen: Soft, non-tender, non-distended, no mass Skin: No rash Extremity Pulses:  2+ radial, brachial, femoral, 2+ right absent left dorsalis pedis, absent posterior tibial pulses bilaterally Musculoskeletal: No deformity or edema  Neurologic: Upper and lower extremity motor 5/5 and symmetric  DATA:  Patient had bilateral ABIs performed today which were 1 and triphasic on the right 0.7 on the left she also had an serial duplex performed which showed short segment left SFA occlusion.  I reviewed and interpreted the studies.  ASSESSMENT: Left leg claudication.  I discussed with the patient today the possibility of a 30-minute walking program smoking cessation conservative management of her problem.  However, she states that she cannot possibly walk this amount of time and that she feels like her lifestyle is inhibited by her current situation.  She is going to make a concerted effort to try to quit smoking.  I discussed with her that her life time risk of limb loss should be about 5% or less if she is able to quit smoking.  I also discussed with her that she currently is not at risk of limb loss and that an intervention would purely be for lifestyle   not for limb salvage.  She understands and agrees to proceed.  Other risks including but not limited to bleeding infection contrast reaction explained the patient today.   PLAN: Abdominal aortogram lower extremity runoff possible intervention scheduled for September 14, 2019.  Charles Fields, MD [facility] [place] Office:  [phone] Pager: [phone]    

## 2019-09-06 NOTE — H&P (View-Only) (Signed)
  Referring Physician: Dr Thomas Duncan  Patient name: Susan Holmes MRN: [id number] DOB: 04/12/1956 Sex: female  REASON FOR CONSULT:   HPI: Susan Holmes is a 63 y.o. female, a several year history of slowly progressive worsening of left leg pain when she ambulates.  She currently is only able to walk about 1 block before experiencing pain in her left leg.  She does not have rest pain.  She has no nonhealing wounds.  She also does have a history of diabetes.  She currently smokes about half a pack of cigarettes per day.  Greater than 3 minutes today spent regarding smoking cessation counseling.  I also discussed with her today the combination of diabetes and smoking increases risk of limb loss 100 fold.  Other medical problems include coronary artery disease, glaucoma, hypertension, hyperlipidemia all of which have been stable.  She is on aspirin and statin.  Past Medical History:  Diagnosis Date  . Anxiety   . Arthritis   . Chest pain 09/26/2018  . Congenital blindness    Left  eye  . Coronary artery disease    per cardiac cath 2015-- mild disease involving LAD and branches ,  LCFx and branches, and mild disease pRCA  . Depression   . Diabetes mellitus, type II (HCC)    followed by pcp  . Glaucoma   . Glaucoma, both eyes   . Hepatitis B infection pt unsure   resolved  . History of transient ischemic attack (TIA) 2017   per pt no residuals  . HTN (hypertension)    followed by pcp  . Hyperlipidemia   . Poor dental hygiene   . Renal calculus, right   . Stroke (HCC)   . Tobacco abuse    .5 PPD smoker   Past Surgical History:  Procedure Laterality Date  . ABDOMINAL HYSTERECTOMY  1980s   unsure if ovaries were taken  . GLAUCOMA SURGERY Right 2017  . IR URETERAL STENT RIGHT NEW ACCESS W/O SEP NEPHROSTOMY CATH  10/03/2018  . LEFT HEART CATHETERIZATION WITH CORONARY ANGIOGRAM N/A 10/09/2014   Procedure: LEFT HEART CATHETERIZATION WITH CORONARY ANGIOGRAM;  Surgeon: Thomas A  Kelly, MD;  Location: [facility] CATH [facility];  Service: Cardiovascular;  Laterality: N/A;  . NEPHROLITHOTOMY Right 10/03/2018   Procedure: RIGHT NEPHROLITHOTOMY PERCUTANEOUS FIRST STAGE;  Surgeon: Wrenn, John, MD;  Location: [facility];  Service: Urology;  Laterality: Right;  . TEE WITHOUT CARDIOVERSION N/A 08/17/2016   Procedure: TRANSESOPHAGEAL ECHOCARDIOGRAM (TEE);  Surgeon: Traci R Turner, MD;  Location: [facility]OS[facility];  Service: Cardiovascular;  Laterality: N/A;    Family History  Problem Relation Age of Onset  . Heart disease Mother 60  . Hypertension Mother   . CVA Mother   . Heart disease Father 60  . CVA Sister   . CVA Brother   . Aneurysm Brother   . Aneurysm Sister   . CVA Maternal Grandmother   . CVA Maternal Grandfather   . Colon cancer Neg Hx   . Esophageal cancer Neg Hx   . Stomach cancer Neg Hx     SOCIAL HISTORY: Social History   Socioeconomic History  . Marital status: Divorced    Spouse name: Not on file  . Number of children: 1  . Years of education: 12  . Highest education level: Not on file  Occupational History    Employer: UNEMPLOYED  Social Needs  . Financial resource strain: Not on file  . Food insecurity    Worry: Not   on file    Inability: Not on file  . Transportation needs    Medical: Not on file    Non-medical: Not on file  Tobacco Use  . Smoking status: Current Every Day Smoker    Packs/day: 0.50    Years: 39.00    Pack years: 19.50    Types: Cigarettes    Start date: 1981  . Smokeless tobacco: Current User    Types: Chew  . Tobacco comment: < half pack per day  Substance and Sexual Activity  . Alcohol use: No    Alcohol/week: 0.0 standard drinks  . Drug use: No  . Sexual activity: Not Currently    Birth control/protection: Surgical  Lifestyle  . Physical activity    Days per week: Not on file    Minutes per session: Not on file  . Stress: Not on file  Relationships  . Social connections    Talks on phone: Not on file    Gets together:  Not on file    Attends religious service: Not on file    Active member of club or organization: Not on file    Attends meetings of clubs or organizations: Not on file    Relationship status: Not on file  . Intimate partner violence    Fear of current or ex partner: Not on file    Emotionally abused: Not on file    Physically abused: Not on file    Forced sexual activity: Not on file  Other Topics Concern  . Not on file  Social History Narrative  . Not on file    Allergies  Allergen Reactions  . Ace Inhibitors Cough  . Chantix [Varenicline] Nausea And Vomiting  . Lipitor [Atorvastatin] Other (See Comments)    MYALGIA     Current Outpatient Medications  Medication Sig Dispense Refill  . ACCU-CHEK FASTCLIX LANCETS MISC CHECK BLOOD SUGAR THREE TIMES DAILY AS DIRECTED 306 each 5  . amLODipine (NORVASC) 10 MG tablet Take 0.5 tablets (5 mg total) by mouth every morning. 45 tablet 2  . aspirin EC 81 MG tablet Take 81 mg by mouth daily.    . Blood Glucose Monitoring Suppl (ACCU-CHEK GUIDE) w/Device KIT 1 each by Does not apply route 3 (three) times daily. 1 kit 0  . budesonide-formoterol (SYMBICORT) 160-4.5 MCG/ACT inhaler Inhale 2 puffs into the lungs 2 (two) times daily as needed. 2 Inhaler 0  . carvedilol (COREG) 12.5 MG tablet TAKE 1 TABLET(12.5 MG) BY MOUTH TWICE DAILY 60 tablet 6  . docusate sodium (COLACE) 100 MG capsule Take 1 capsule (100 mg total) by mouth 2 (two) times daily. 10 capsule 0  . dorzolamide-timolol (COSOPT) 22.3-6.8 MG/ML ophthalmic solution Place 1 drop into both eyes 2 (two) times daily.     . Dulaglutide (TRULICITY) 1.5 MG/0.5ML SOPN Inject 1.5 mg into the skin once a week. 4 pen 2  . DULoxetine (CYMBALTA) 30 MG capsule     . ibuprofen (ADVIL) 800 MG tablet TK 1 T PO QID PRN    . Insulin Glargine (LANTUS SOLOSTAR) 100 UNIT/ML Solostar Pen Inject 24 Units into the skin at bedtime. 5 pen 5  . Insulin Pen Needle (VALUMARK PEN NEEDLES) 31G X 8 MM MISC 1 each by  Does not apply route daily. 100 each 3  . Loperamide HCl 1 MG/7.5ML LIQD Take 30 mLs (4 mg total) by mouth 3 (three) times daily as needed. 150 mL 0  . mirtazapine (REMERON) 45 MG tablet Take 45 mg   by mouth at bedtime.    . Multiple Vitamins-Minerals (CENTRUM SILVER PO) Take 1 tablet by mouth daily.    Glory Rosebush ULTRA test strip USE TO TEST BLOOD GLUCOSE THREE TIMES DAILY 100 strip 12  . rosuvastatin (CRESTOR) 10 MG tablet Take 2 tablets (20 mg total) by mouth daily. 90 tablet 1  . traZODone (DESYREL) 100 MG tablet Take 300 mg by mouth at bedtime.     Marland Kitchen losartan (COZAAR) 100 MG tablet Take 1 tablet (100 mg total) by mouth daily. (Patient taking differently: Take 100 mg by mouth every morning. ) 90 tablet 3   No current facility-administered medications for this visit.     ROS:   General:  No weight loss, Fever, chills  HEENT: No recent headaches, no nasal bleeding, no visual changes, no sore throat  Neurologic: No dizziness, blackouts, seizures. No recent symptoms of stroke or mini- stroke. No recent episodes of slurred speech, or temporary blindness.  Cardiac: No recent episodes of chest pain/pressure, no shortness of breath at rest.  No shortness of breath with exertion.  Denies history of atrial fibrillation or irregular heartbeat  Vascular: No history of rest pain in feet.  + history of claudication.  No history of non-healing ulcer, No history of DVT   Pulmonary: No home oxygen, no productive cough, no hemoptysis,  No asthma or wheezing  Musculoskeletal:  [X] Arthritis, [ ] Low back pain,  [X] Joint pain  Hematologic:No history of hypercoagulable state.  No history of easy bleeding.  No history of anemia  Gastrointestinal: No hematochezia or melena,  No gastroesophageal reflux, no trouble swallowing  Urinary: [ ] chronic Kidney disease, [ ] on HD - [ ] MWF or [ ] TTHS, [ ] Burning with urination, [ ] Frequent urination, [ ] Difficulty urinating;   Skin: No rashes   Psychological: No history of anxiety,  No history of depression   Physical Examination  Vitals:   09/06/19 1017  BP: 126/84  Pulse: 67  Resp: 20  Temp: (!) 97.2 F (36.2 C)  SpO2: 99%  Weight: 160 lb 4.8 oz (72.7 kg)  Height: 5' 7" (1.702 m)    Body mass index is 25.11 kg/m.  General:  Alert and oriented, no acute distress HEENT: Normal Neck: No JVD Cardiac: Regular Rate and Rhythm  Abdomen: Soft, non-tender, non-distended, no mass Skin: No rash Extremity Pulses:  2+ radial, brachial, femoral, 2+ right absent left dorsalis pedis, absent posterior tibial pulses bilaterally Musculoskeletal: No deformity or edema  Neurologic: Upper and lower extremity motor 5/5 and symmetric  DATA:  Patient had bilateral ABIs performed today which were 1 and triphasic on the right 0.7 on the left she also had an serial duplex performed which showed short segment left SFA occlusion.  I reviewed and interpreted the studies.  ASSESSMENT: Left leg claudication.  I discussed with the patient today the possibility of a 30-minute walking program smoking cessation conservative management of her problem.  However, she states that she cannot possibly walk this amount of time and that she feels like her lifestyle is inhibited by her current situation.  She is going to make a concerted effort to try to quit smoking.  I discussed with her that her life time risk of limb loss should be about 5% or less if she is able to quit smoking.  I also discussed with her that she currently is not at risk of limb loss and that an intervention would purely be for lifestyle  not for limb salvage.  She understands and agrees to proceed.  Other risks including but not limited to bleeding infection contrast reaction explained the patient today.   PLAN: Abdominal aortogram lower extremity runoff possible intervention scheduled for September 14, 2019.  Charles Fields, MD [facility]  Office:  [phone] Pager: [phone]    

## 2019-09-08 IMAGING — XA IR URETURAL STENT RIGHT NEW ACCESS W/O SEP NEPHROSTOMY CATH
1 series · 5 of 5 positions shown · non-contrast
Comparison: none

CLINICAL DATA: Symptomatic right nephrolithiasis, planned
percutaneous nephrolithotomy

EXAM:
RIGHT PERCUTANEOUS NEPHROURETERAL CATHETER PLACEMENT UNDER
FLUOROSCOPIC GUIDANCE
FLUOROSCOPY TIME:  18 seconds; 40 mGy
TECHNIQUE: The procedure, risks (including but not limited to bleeding,
infection, organ damage ), benefits, and alternatives were explained
to the patient. Questions regarding the procedure were encouraged
and answered. The patient understands and consents to the procedure.

[Series 300: ir ureteral stent right new access w/o s · non-contrast · 5 of 5 slices shown]
[im 1/5]
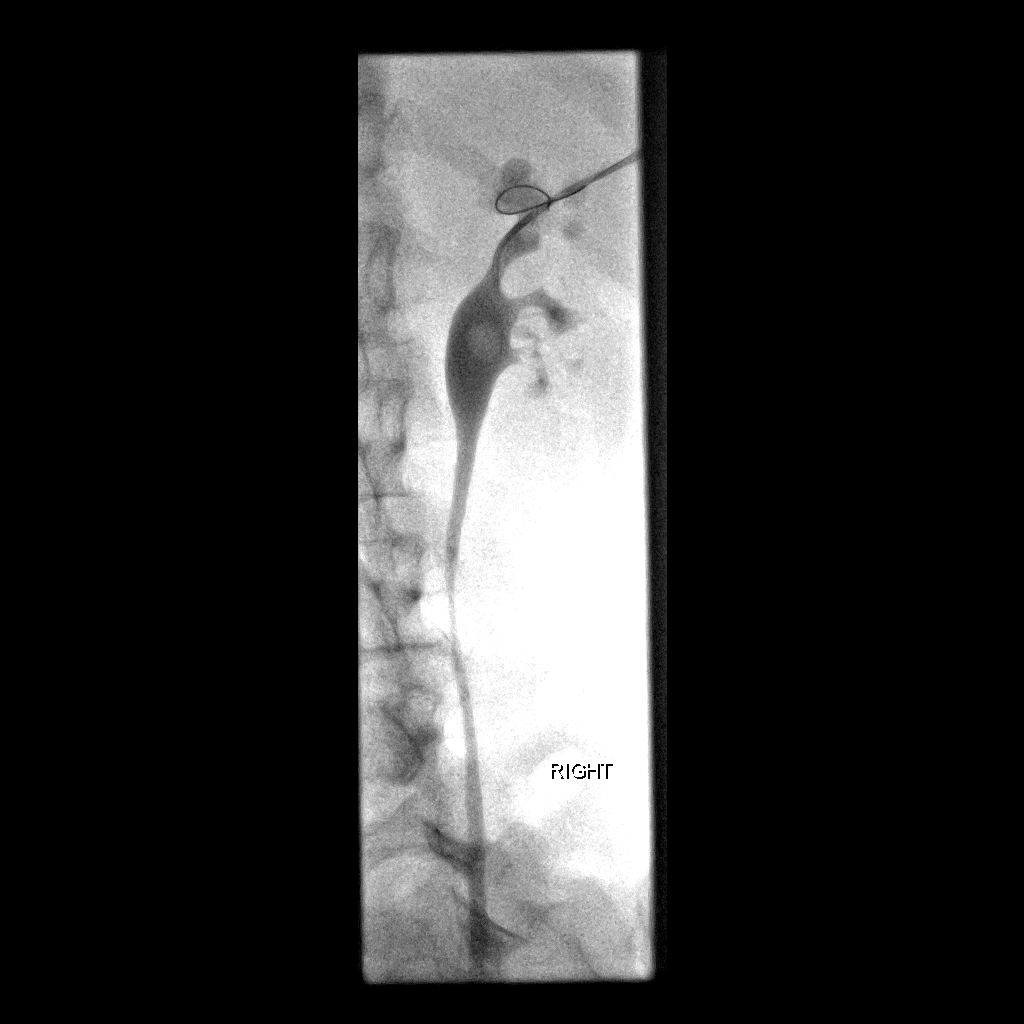
[im 2/5]
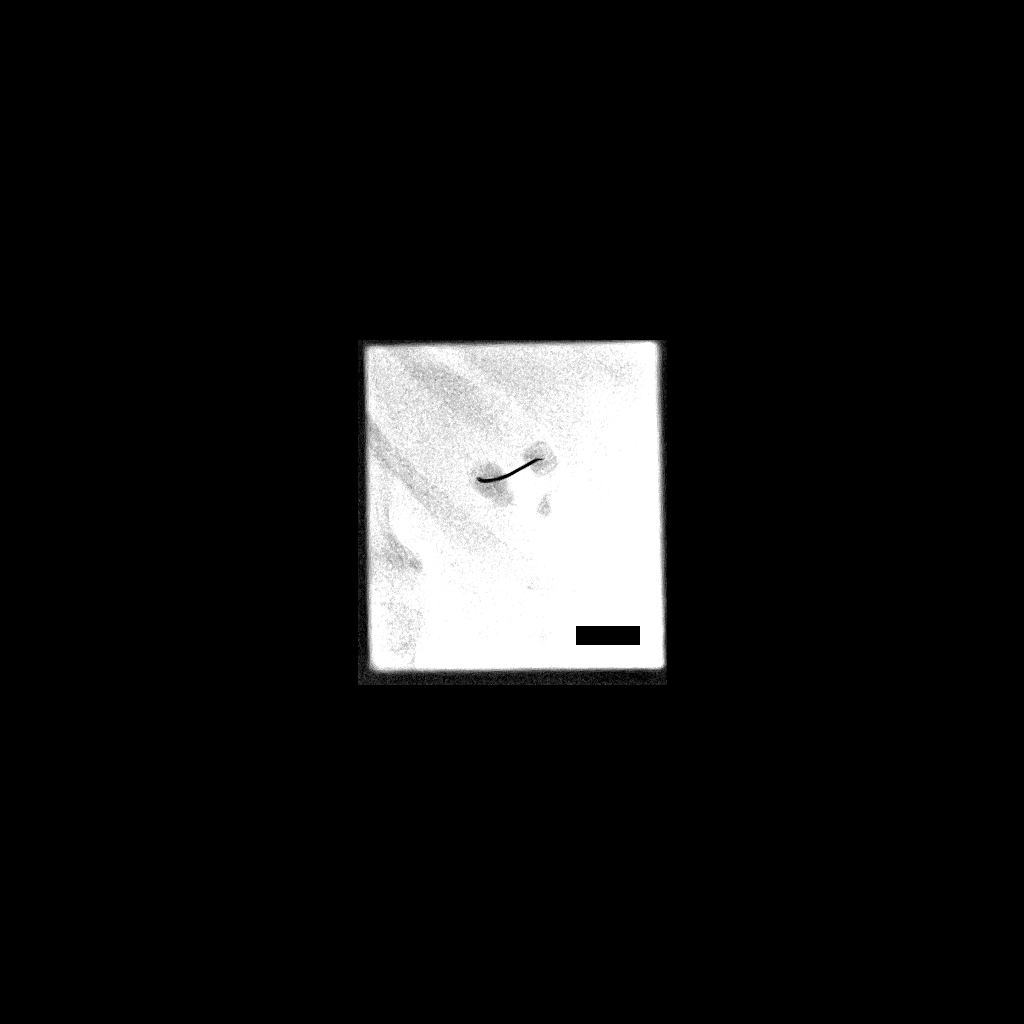
[im 3/5]
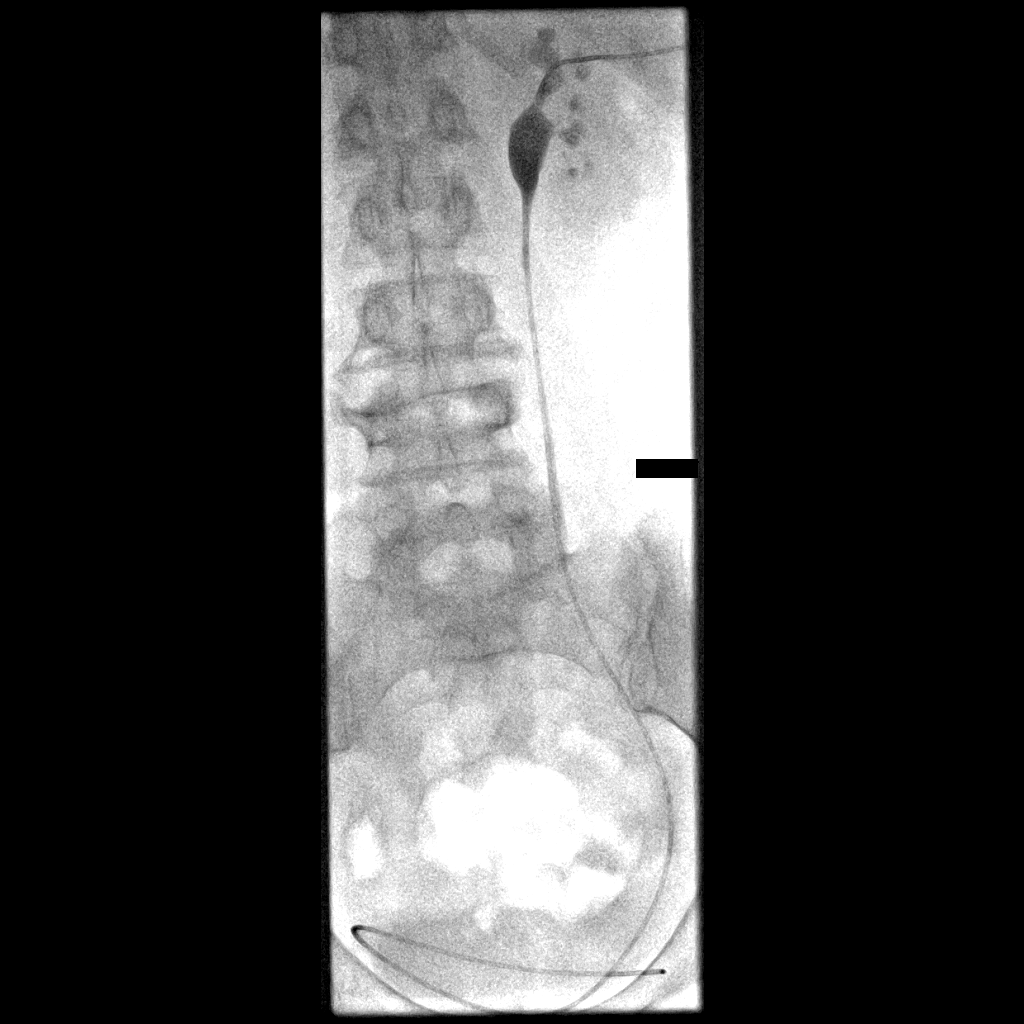
[im 4/5]
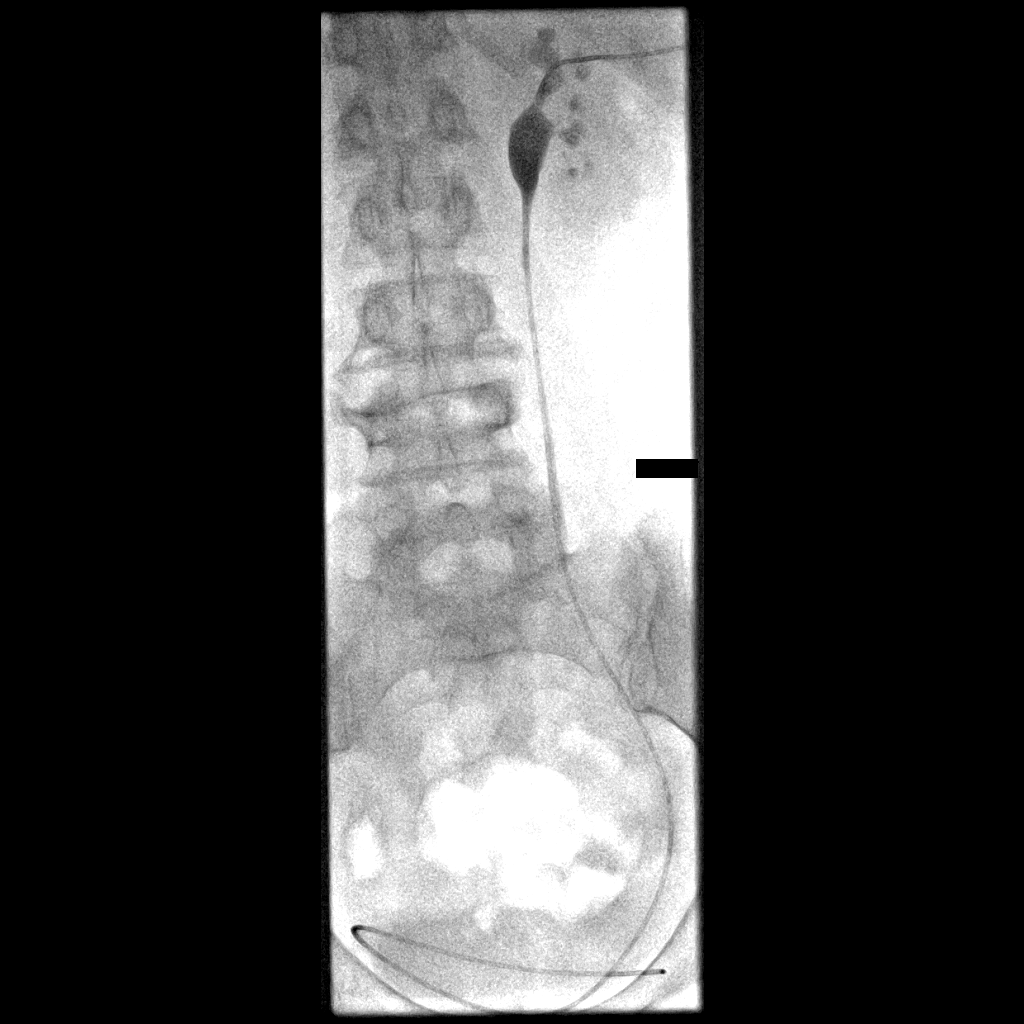
[im 5/5]
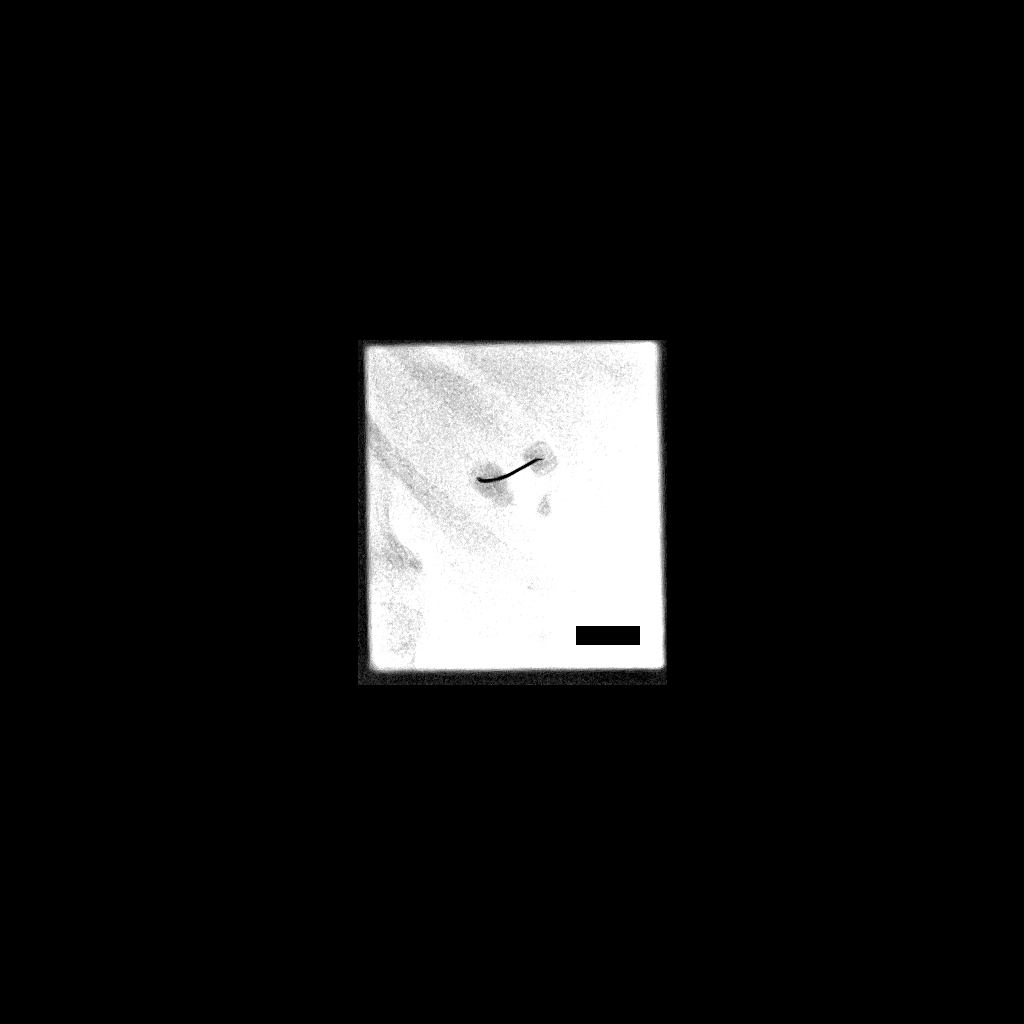

[5 of 5 positions shown; findings below may reference images not displayed]

Right flank region prepped with Betadine, draped in usual sterile
fashion, infiltrated locally with 1% lidocaine.

Intravenous Fentanyl and Versed were administered as conscious
sedation during continuous monitoring of the patient's level of
consciousness and physiological / cardiorespiratory status by the
radiology RN, with a total moderate sedation time of 16 minutes.

Under real-time fluoroscopic guidance, a 21-gauge trocar needle was
advanced into a posterior upper pole calyx using the dominant
peripheral radiodense calculus as a guide. A 018 guidewire advanced
easily into the renal collecting system. Contrast injection
confirmed appropriate positioning. Needle was exchanged over a
guidewire for transitional dilator. A parallel 035 angled Glidewire
catheter was advanced into the proximal ureter. The transitional
dilator was removed and a 5 French Kumpe the catheter advanced for
antegrade nephrostogram. The Kumpe was then advanced over the
guidewire into the urinary bladder. Radiograph confirms appropriate
nephroureteral catheter positioning. Catheter capped and secured
externally. The patient tolerated the procedure well.

COMPLICATIONS:
COMPLICATIONS
None.
IMPRESSION: 1. Technically successful right antegrade percutaneous
nephroureteral catheter placement.

## 2019-09-11 ENCOUNTER — Other Ambulatory Visit (HOSPITAL_COMMUNITY)
Admission: RE | Admit: 2019-09-11 | Discharge: 2019-09-11 | Disposition: A | Discharge status: Home / Self Care | Payer: Medicare Other | Source: Ambulatory Visit | Attending: Vascular Surgery | Admitting: Vascular Surgery

## 2019-09-11 ENCOUNTER — Other Ambulatory Visit: Payer: Self-pay

## 2019-09-11 DIAGNOSIS — Z20828 Contact with and (suspected) exposure to other viral communicable diseases: Secondary | ICD-10-CM | POA: Insufficient documentation

## 2019-09-11 DIAGNOSIS — Z01812 Encounter for preprocedural laboratory examination: Secondary | ICD-10-CM | POA: Diagnosis not present

## 2019-09-11 DIAGNOSIS — I739 Peripheral vascular disease, unspecified: Secondary | ICD-10-CM | POA: Diagnosis not present

## 2019-09-11 NOTE — Patient Outreach (Signed)
[facility]) Care Management  09/11/2019  [name] [birth year] [id number]   Medication Adherence call to Susan Holmes Hippa Identifiers Verify spoke with patient she is past due on Rosuvastatin 10 mg patient explain she takes 1 tablet daily patient ask to call [facility] and order this medication [facility] doctor office patient does not have refill try calling doctors office no answer.Susan Holmes is showing past due under [name].  [facility] Direct Dial [phone]  Fax [phone] [email or web address]

## 2019-09-12 LAB — NOVEL CORONAVIRUS, NAA (HOSP ORDER, SEND-OUT TO REF LAB; TAT 18-24 HRS): SARS-CoV-2, NAA: NOT DETECTED

## 2019-09-14 ENCOUNTER — Encounter (HOSPITAL_COMMUNITY)
Admission: AD | Disposition: A | Discharge status: Home / Self Care | Payer: Self-pay | Source: Home / Self Care | Attending: Vascular Surgery

## 2019-09-14 ENCOUNTER — Observation Stay (HOSPITAL_COMMUNITY)
Admission: AD | Admit: 2019-09-14 | Discharge: 2019-09-14 | Disposition: A | Discharge status: Home / Self Care | Payer: Medicare Other | Attending: Vascular Surgery | Admitting: Vascular Surgery

## 2019-09-14 ENCOUNTER — Ambulatory Visit: Payer: Medicare Other | Admitting: Podiatry

## 2019-09-14 ENCOUNTER — Other Ambulatory Visit: Payer: Self-pay

## 2019-09-14 DIAGNOSIS — F329 Major depressive disorder, single episode, unspecified: Secondary | ICD-10-CM | POA: Diagnosis not present

## 2019-09-14 DIAGNOSIS — I10 Essential (primary) hypertension: Secondary | ICD-10-CM | POA: Insufficient documentation

## 2019-09-14 DIAGNOSIS — F419 Anxiety disorder, unspecified: Secondary | ICD-10-CM | POA: Insufficient documentation

## 2019-09-14 DIAGNOSIS — E1151 Type 2 diabetes mellitus with diabetic peripheral angiopathy without gangrene: Secondary | ICD-10-CM | POA: Diagnosis not present

## 2019-09-14 DIAGNOSIS — I2510 Atherosclerotic heart disease of native coronary artery without angina pectoris: Secondary | ICD-10-CM | POA: Insufficient documentation

## 2019-09-14 DIAGNOSIS — H409 Unspecified glaucoma: Secondary | ICD-10-CM | POA: Diagnosis not present

## 2019-09-14 DIAGNOSIS — I739 Peripheral vascular disease, unspecified: Secondary | ICD-10-CM | POA: Diagnosis present

## 2019-09-14 DIAGNOSIS — Z7982 Long term (current) use of aspirin: Secondary | ICD-10-CM | POA: Diagnosis not present

## 2019-09-14 DIAGNOSIS — I70212 Atherosclerosis of native arteries of extremities with intermittent claudication, left leg: Secondary | ICD-10-CM | POA: Diagnosis not present

## 2019-09-14 DIAGNOSIS — I70222 Atherosclerosis of native arteries of extremities with rest pain, left leg: Secondary | ICD-10-CM | POA: Diagnosis not present

## 2019-09-14 DIAGNOSIS — Z8673 Personal history of transient ischemic attack (TIA), and cerebral infarction without residual deficits: Secondary | ICD-10-CM | POA: Diagnosis not present

## 2019-09-14 DIAGNOSIS — M1990 Unspecified osteoarthritis, unspecified site: Secondary | ICD-10-CM | POA: Diagnosis not present

## 2019-09-14 DIAGNOSIS — Z79899 Other long term (current) drug therapy: Secondary | ICD-10-CM | POA: Insufficient documentation

## 2019-09-14 DIAGNOSIS — F17210 Nicotine dependence, cigarettes, uncomplicated: Secondary | ICD-10-CM | POA: Diagnosis not present

## 2019-09-14 DIAGNOSIS — Z794 Long term (current) use of insulin: Secondary | ICD-10-CM | POA: Diagnosis not present

## 2019-09-14 DIAGNOSIS — E785 Hyperlipidemia, unspecified: Secondary | ICD-10-CM | POA: Diagnosis not present

## 2019-09-14 HISTORY — PX: ABDOMINAL AORTOGRAM W/LOWER EXTREMITY: CATH118223

## 2019-09-14 HISTORY — PX: PERIPHERAL VASCULAR INTERVENTION: CATH118257

## 2019-09-14 HISTORY — DX: Peripheral vascular disease, unspecified: I73.9

## 2019-09-14 LAB — POCT I-STAT, CHEM 8
BUN: 10 mg/dL (ref 8–23)
Calcium, Ion: 1.29 mmol/L (ref 1.15–1.40)
Chloride: 103 mmol/L (ref 98–111)
Creatinine, Ser: 0.7 mg/dL (ref 0.44–1.00)
Glucose, Bld: 443 mg/dL — ABNORMAL HIGH (ref 70–99)
HCT: 42 % (ref 36.0–46.0)
Hemoglobin: 14.3 g/dL (ref 12.0–15.0)
Potassium: 3.8 mmol/L (ref 3.5–5.1)
Sodium: 139 mmol/L (ref 135–145)
TCO2: 25 mmol/L (ref 22–32)

## 2019-09-14 LAB — GLUCOSE, CAPILLARY
Glucose-Capillary: 372 mg/dL — ABNORMAL HIGH (ref 70–99)
Glucose-Capillary: 346 mg/dL — ABNORMAL HIGH (ref 70–99)
Glucose-Capillary: 292 mg/dL — ABNORMAL HIGH (ref 70–99)
Glucose-Capillary: 121 mg/dL — ABNORMAL HIGH (ref 70–99)

## 2019-09-14 LAB — POCT ACTIVATED CLOTTING TIME
Activated Clotting Time: 213 seconds
Activated Clotting Time: 241 seconds
Activated Clotting Time: 120 seconds

## 2019-09-14 SURGERY — ABDOMINAL AORTOGRAM W/LOWER EXTREMITY
Anesthesia: LOCAL

## 2019-09-14 MED ORDER — SODIUM CHLORIDE 0.9 % IV SOLN: 250.0000 mL | INTRAVENOUS | Status: DC | PRN

## 2019-09-14 MED ORDER — OXYCODONE HCL 5 MG PO TABS: 5.0000 mg | ORAL_TABLET | ORAL | Status: DC | PRN

## 2019-09-14 MED ORDER — SODIUM CHLORIDE 0.9% FLUSH: 3.0000 mL | Freq: Two times a day (BID) | INTRAVENOUS | Status: DC

## 2019-09-14 MED ORDER — SODIUM CHLORIDE 0.9 % IV SOLN
INTRAVENOUS | Status: DC
  Administered 2019-09-14: 06:00:00 via INTRAVENOUS

## 2019-09-14 MED ORDER — HEPARIN (PORCINE) IN NACL 1000-0.9 UT/500ML-% IV SOLN
INTRAVENOUS | Status: AC
  Filled 2019-09-14: qty 1000

## 2019-09-14 MED ORDER — PROTAMINE SULFATE 10 MG/ML IV SOLN
INTRAVENOUS | Status: AC
  Filled 2019-09-14: qty 5

## 2019-09-14 MED ORDER — MORPHINE SULFATE (PF) 2 MG/ML IV SOLN
INTRAVENOUS | Status: AC
  Filled 2019-09-14: qty 1

## 2019-09-14 MED ORDER — SODIUM CHLORIDE 0.9 % IV SOLN: INTRAVENOUS | Status: AC

## 2019-09-14 MED ORDER — SODIUM CHLORIDE 0.9% FLUSH: 3.0000 mL | INTRAVENOUS | Status: DC | PRN

## 2019-09-14 MED ORDER — HEPARIN SODIUM (PORCINE) 1000 UNIT/ML IJ SOLN
INTRAMUSCULAR | Status: AC
  Filled 2019-09-14: qty 1

## 2019-09-14 MED ORDER — HEPARIN SODIUM (PORCINE) 1000 UNIT/ML IJ SOLN
INTRAMUSCULAR | Status: DC | PRN
  Administered 2019-09-14: 2000 [IU] via INTRAVENOUS
  Administered 2019-09-14: 8000 [IU] via INTRAVENOUS

## 2019-09-14 MED ORDER — ACETAMINOPHEN 325 MG PO TABS: 650.0000 mg | ORAL_TABLET | ORAL | Status: DC | PRN

## 2019-09-14 MED ORDER — OXYCODONE-ACETAMINOPHEN 5-325 MG PO TABS: 1.0000 | ORAL_TABLET | Freq: Four times a day (QID) | ORAL | 0 refills | Status: DC | PRN

## 2019-09-14 MED ORDER — LIDOCAINE HCL (PF) 1 % IJ SOLN
INTRAMUSCULAR | Status: AC
  Filled 2019-09-14: qty 30

## 2019-09-14 MED ORDER — ASPIRIN EC 81 MG PO TBEC: 81.0000 mg | DELAYED_RELEASE_TABLET | Freq: Every day | ORAL | Status: DC

## 2019-09-14 MED ORDER — FENTANYL CITRATE (PF) 100 MCG/2ML IJ SOLN
INTRAMUSCULAR | Status: DC | PRN
  Administered 2019-09-14 (×3): 25 ug via INTRAVENOUS

## 2019-09-14 MED ORDER — FENTANYL CITRATE (PF) 100 MCG/2ML IJ SOLN
INTRAMUSCULAR | Status: AC
  Filled 2019-09-14: qty 2

## 2019-09-14 MED ORDER — LIDOCAINE HCL (PF) 1 % IJ SOLN
INTRAMUSCULAR | Status: DC | PRN
  Administered 2019-09-14: 15 mL

## 2019-09-14 MED ORDER — LABETALOL HCL 5 MG/ML IV SOLN: 10.0000 mg | INTRAVENOUS | Status: DC | PRN

## 2019-09-14 MED ORDER — HEPARIN (PORCINE) IN NACL 1000-0.9 UT/500ML-% IV SOLN
INTRAVENOUS | Status: DC | PRN
  Administered 2019-09-14 (×2): 500 mL

## 2019-09-14 MED ORDER — INSULIN ASPART 100 UNIT/ML ~~LOC~~ SOLN
10.0000 [IU] | Freq: Once | SUBCUTANEOUS | Status: AC
  Administered 2019-09-14: 07:00:00 10 [IU] via SUBCUTANEOUS

## 2019-09-14 MED ORDER — ONDANSETRON HCL 4 MG/2ML IJ SOLN
4.0000 mg | Freq: Four times a day (QID) | INTRAMUSCULAR | Status: DC | PRN
  Administered 2019-09-14: 4 mg via INTRAVENOUS

## 2019-09-14 MED ORDER — ONDANSETRON HCL 4 MG/2ML IJ SOLN
INTRAMUSCULAR | Status: AC
  Filled 2019-09-14: qty 2

## 2019-09-14 MED ORDER — HYDRALAZINE HCL 20 MG/ML IJ SOLN: 5.0000 mg | INTRAMUSCULAR | Status: DC | PRN

## 2019-09-14 MED ORDER — INSULIN ASPART 100 UNIT/ML ~~LOC~~ SOLN
SUBCUTANEOUS | Status: AC
  Filled 2019-09-14: qty 1

## 2019-09-14 MED ORDER — MORPHINE SULFATE (PF) 2 MG/ML IV SOLN
2.0000 mg | INTRAVENOUS | Status: DC | PRN
  Administered 2019-09-14: 2 mg via INTRAVENOUS

## 2019-09-14 MED ORDER — IODIXANOL 320 MG/ML IV SOLN
INTRAVENOUS | Status: DC | PRN
  Administered 2019-09-14: 150 mL via INTRA_ARTERIAL

## 2019-09-14 MED ORDER — PROTAMINE SULFATE 10 MG/ML IV SOLN
50.0000 mg | Freq: Once | INTRAVENOUS | Status: AC
  Administered 2019-09-14: 50 mg via INTRAVENOUS

## 2019-09-14 SURGICAL SUPPLY — 21 items
BALLN MUSTANG 7X20X135 (BALLOONS) ×3
BALLOON MUSTANG 7X20X135 (BALLOONS) ×2 IMPLANT
CATH ANGIO 5F BER2 100CM (CATHETERS) ×3 IMPLANT
CATH ANGIO 5F PIGTAIL 65CM (CATHETERS) ×3 IMPLANT
CATH CROSS OVER TEMPO 5F (CATHETERS) ×3 IMPLANT
CATH INFINITI 5 FR IM (CATHETERS) ×3 IMPLANT
CATH QUICKCROSS .035X135CM (MICROCATHETER) ×3 IMPLANT
CATH TEMPO AQUA 5F 100CM (CATHETERS) ×3 IMPLANT
GUIDEWIRE ANGLED .035X260CM (WIRE) ×3 IMPLANT
KIT ENCORE 26 ADVANTAGE (KITS) ×3 IMPLANT
KIT PV (KITS) ×3 IMPLANT
SHEATH PINNACLE 5F 10CM (SHEATH) ×3 IMPLANT
SHEATH PINNACLE 7F 10CM (SHEATH) ×3 IMPLANT
SHEATH PINNACLE ST 7F 65CM (SHEATH) ×3 IMPLANT
SHEATH PROBE COVER 6X72 (BAG) ×3 IMPLANT
STENT ABSOLUTE PRO 7.0X20X135 (Permanent Stent) ×3 IMPLANT
SYR MEDRAD MARK V 150ML (SYRINGE) ×3 IMPLANT
TRANSDUCER W/STOPCOCK (MISCELLANEOUS) ×3 IMPLANT
TRAY PV CATH (CUSTOM PROCEDURE TRAY) ×3 IMPLANT
WIRE HITORQ VERSACORE ST 145CM (WIRE) ×3 IMPLANT
WIRE VERSACORE LOC 115CM (WIRE) ×3 IMPLANT

## 2019-09-14 NOTE — Progress Notes (Signed)
Discharge instructions reviewed.  Voices understanding.

## 2019-09-14 NOTE — Progress Notes (Signed)
Site area- right  Site Prior to Removal- 0   Pressure Applied For-  20 MInutes   Bedrest Beginning at - 1030   Manual- Yes   Patient Status During Pull- Stable    Post Pull Groin Site- 0   Post Pull Instructions Given- Yes   Post Pull Pulses Present- Yes    Dressing Applied- Tegaderm and Gauze Dressing    Comments:  pulled by l murphy, rn

## 2019-09-14 NOTE — Op Note (Addendum)
Procedure: Abdominal aortogram with bilateral lower extremity runoff, attempted left superficial femoral artery stenting, left external iliac stent (7 x 20 self-expanding)  Preoperative diagnosis: Claudication  Postoperative diagnosis: Same  Anesthesia: Local with IV sedation  Operative findings: Number 170% stenosis left external iliac stented to 0% residual stenosis  2.  Chronic total occlusion left superficial femoral artery extending from the proximal thigh to the above-knee popliteal artery  3.  Bilateral three-vessel runoff no significant flow-limiting stenosis right leg  Operative details: After pain informed consent, the patient in the [place] lab.  The patient placed supine position on the angios table.  Both groins were prepped and draped in usual sterile fashion.  Ultrasound was used to identify the right common femoral artery and femoral bifurcation.  Introducer needle was used to cannulate the right common femoral artery after infiltration of local anesthesia.  An 035 versa core wire threaded up the abdominal aorta under fluoroscopic guidance.  5 French sheath was then placed over the guidewire in the right common femoral artery.  This was thoroughly flushed with heparinized saline.  A 5 French pigtail catheter was then advanced over the guidewire in the abdominal aorta and abdominal aortogram was obtained in AP projection.  Left and right renal arteries are widely patent.  The infrarenal abdominal aorta is widely patent.  The left and right common iliac arteries are widely patent.  There is a 70% stenosis of the origin of the left external iliac artery.  The right external iliac artery and bilateral internal iliac arteries are patent.  The internal iliac arteries are small bilaterally.  Next the pigtail catheter was pulled on this above the aortic bifurcation and bilateral oblique views of the pelvis were performed with magnification to confirm the above findings.  At this point bilateral  lower extremity runoff views were obtained through the pigtail catheter.  In the right lower extremity, the right common femoral profunda femoris and superficial femoral popliteal anterior tibial posterior tibial and peroneal arteries are all widely patent.  In the left lower extremity the left common femoral profunda femoris is patent.  The left superficial femoral artery origin is patent but this vessel occludes about 4 cm after the origin.  The above-knee popliteal artery then reconstitutes via 2 very large SFA collaterals.  The popliteal artery is patent.  There is three-vessel runoff to the left foot.  At this point we decided to intervene on the left superficial femoral artery occlusion and the external iliac artery stenosis.  The 5 French sheath was swapped out for a 7 [place] destination sheath over a guidewire.  A 5 French crossover catheter was used to selectively catheterize the left common iliac artery and then an 035 Glidewire advanced into the proximal superficial femoral artery under fluoroscopic guidance.  The sheath was advanced up and over the aortic bifurcation all the way into the origin of the left superficial femoral artery.  The patient was given 8000 units of heparin.  She was given additional 2000 units of heparin to keep the ACT above 250.  Using the 035 angled Glidewire and a 5 French straight catheter I was able to cross the proximal two thirds of the lesion fairly easily.  However I could not get the catheter or wire to reenter the main channel of the SFA due to the wire tracking out SFA collaterals.  I swapped this out for a 5 [place] Berenstein 2 catheter to try to redirect the wire and this was unsuccessful.  I finally tried  with a 035 quick cross catheter and also was unable to get this redirected and reenter the vessel.  At this point attempts in the SFA were abandoned.  The catheter was pulled back.  The sheath was then pulled back to just above the lesion in the left external  iliac artery.  Contrast angiogram was performed and a 7 x 20 self-expanding stent was selected and advanced and centered on the lesion from the origin of the left S2 external iliac artery.  This was then deployed in the usual fashion and then postdilated with a 7 x 20 balloon to 10 atm for 1 minute.  Completion angiogram showed good wall apposition of the stent no evidence of dissection and 0% residual stenosis of the left external iliac artery.  This point the sheath was pulled back and exchanged out for a 7 French short sheath in the right groin.  The guidewire was then removed.  The sheath was thoroughly flushed with heparinized saline.  Patient tired procedure well and there were no complications.  The patient was taken to the holding area in stable condition.  Operative management: The patient now has adequate inflow with no significant external iliac artery stenosis on the left side.  We will schedule her for cardiac risk stratification in the near future followed by left femoral to above-knee popliteal bypass.  Ruta Hinds, MD [facility] [place] Office: [phone] Pager: [phone]

## 2019-09-14 NOTE — Interval H&P Note (Signed)
History and Physical Interval Note:  09/14/2019 7:35 AM  Susan Holmes  has presented today for surgery, with the diagnosis of peripheral vascular disease.  The various methods of treatment have been discussed with the patient and family. After consideration of risks, benefits and other options for treatment, the patient has consented to  Procedure(s): ABDOMINAL AORTOGRAM W/LOWER EXTREMITY (N/A) as a surgical intervention.  The patient's history has been reviewed, patient examined, no change in status, stable for surgery.  I have reviewed the patient's chart and labs.  Questions were answered to the patient's satisfaction.     Ruta Hinds

## 2019-09-14 NOTE — Discharge Instructions (Signed)
Femoral Site Care °This sheet gives you information about how to care for yourself after your procedure. Your health care provider may also give you more specific instructions. If you have problems or questions, contact your health care provider. °What can I expect after the procedure? °After the procedure, it is common to have: °· Bruising that usually fades within 1-2 weeks. °· Tenderness at the site. °Follow these instructions at home: °Wound care °· Follow instructions from your health care provider about how to take care of your insertion site. Make sure you: °? Wash your hands with soap and water before you change your bandage (dressing). If soap and water are not available, use hand sanitizer. °? Change your dressing as told by your health care provider. °? Leave stitches (sutures), skin glue, or adhesive strips in place. These skin closures may need to stay in place for 2 weeks or longer. If adhesive strip edges start to loosen and curl up, you may trim the loose edges. Do not remove adhesive strips completely unless your health care provider tells you to do that. °· Do not take baths, swim, or use a hot tub until your health care provider approves. °· You may shower 24-48 hours after the procedure or as told by your health care provider. °? Gently wash the site with plain soap and water. °? Pat the area dry with a clean towel. °? Do not rub the site. This may cause bleeding. °· Do not apply powder or lotion to the site. Keep the site clean and dry. °· Check your femoral site every day for signs of infection. Check for: °? Redness, swelling, or pain. °? Fluid or blood. °? Warmth. °? Pus or a bad smell. °Activity °· For the first 2-3 days after your procedure, or as long as directed: °? Avoid climbing stairs as much as possible. °? Do not squat. °· Do not lift anything that is heavier than 10 lb (4.5 kg), or the limit that you are told, until your health care provider says that it is safe. °· Rest as  directed. °? Avoid sitting for a long time without moving. Get up to take short walks every 1-2 hours. °· Do not drive for 24 hours if you were given a medicine to help you relax (sedative). °General instructions °· Take over-the-counter and prescription medicines only as told by your health care provider. °· Keep all follow-up visits as told by your health care provider. This is important. °Contact a health care provider if you have: °· A fever or chills. °· You have redness, swelling, or pain around your insertion site. °Get help right away if: °· The catheter insertion area swells very fast. °· You pass out. °· You suddenly start to sweat or your skin gets clammy. °· The catheter insertion area is bleeding, and the bleeding does not stop when you hold steady pressure on the area. °· The area near or just beyond the catheter insertion site becomes pale, cool, tingly, or numb. °These symptoms may represent a serious problem that is an emergency. Do not wait to see if the symptoms will go away. Get medical help right away. Call your local emergency services (911 in the [place] Do not drive yourself to the hospital. °Summary °· After the procedure, it is common to have bruising that usually fades within 1-2 weeks. °· Check your femoral site every day for signs of infection. °· Do not lift anything that is heavier than 10 lb (4.5 kg), or the   limit that you are told, until your health care provider says that it is safe. °This information is not intended to replace advice given to you by your health care provider. Make sure you discuss any questions you have with your health care provider. °Document Released: 08/16/2014 Document Revised: 12/26/2017 Document Reviewed: 12/26/2017 °Elsevier Patient Education © 2020 Elsevier Inc. ° °

## 2019-09-14 NOTE — Progress Notes (Signed)
Patient noted to have some swelling in the anterior portion over her vastus medialis in the left leg.  She has some tenderness over this.  She did have an area of perforation in her SFA during the attempt at recanalization.  She has probably had some extravasation of contrast and blood into this area.  We will reverse her heparin with protamine at this point.  If she has continued expansion of hematoma in this area will consider doing her femoropopliteal bypass today when and evacuation of the hematoma.  Hopefully this will resolve with conservative measures.  We will recheck patient after her heparin has been completely reversed.  Ruta Hinds, MD [facility] [place] Office: [phone] Pager: [phone]

## 2019-09-14 NOTE — Progress Notes (Signed)
Leg much softer still with small hematoma but not expanding foot feels ok motor sensory intact  Will d/c home after bedrest if remains stable  Ruta Hinds, MD [facility] [place]: [phone] Pager: [phone]

## 2019-09-14 NOTE — Progress Notes (Signed)
Pulses in lower left extremity no longer palpable. Left thigh warm and tight. Spreading above and below original area. Pain 8/10 as stated by patient. ACT 120  MD notified. Orders to pull sheath and Someone would be over from vas lab to assist with Left leg.

## 2019-09-14 NOTE — Progress Notes (Signed)
Stephanie RT at bedside to wrap leg. Morphine given for pain.

## 2019-09-14 NOTE — Progress Notes (Signed)
Left leg swollen and painful upon arriving. MD notified and arrived at bedside.  Orders for protamine and act to be done after 10-15 minutes of delivery of protamine.   Right Groin site CDI and not painful upon palpitation.

## 2019-09-14 NOTE — Progress Notes (Signed)
Called to see patient for increased swelling left thigh and loss of Doppler signals in left foot.  I examined the patient she does have swelling in the anterior portion of her thigh similar as to previous.  This actually seems a little bit improved to me.  She does still have some pain but this has improved with morphine.  I was able to easily find a dorsalis pedis Doppler signal in the left foot.  She does not complain of any numbness or tingling in the left foot.  He has normal motor function in the left foot.  She has had no hemodynamic instability.  Vitals:   09/14/19 0955 09/14/19 1010 09/14/19 1015 09/14/19 1020  BP: (!) 160/82 (!) 167/69 (!) 182/80 (!) 153/92  Pulse: (!) 53 (!) 55 (!) 58 60  Resp: 18 18 15 14   Temp:      TempSrc:      SpO2: 100% 100% 100% 100%  Weight:      Height:         Continued observation with an Ace wrap on the left thigh for now.  If patient has continued expansion of the hematoma over time would consider operative intervention.  This should improve with protamine and pressure as the area of extravasation is in a chronically occluded artery.  We will keep patient n.p.o. for now.  She will be admitted for observation.  Ruta Hinds, MD [facility] [place] Office: [phone] Pager: [phone]

## 2019-09-17 ENCOUNTER — Encounter (HOSPITAL_COMMUNITY): Payer: Self-pay | Admitting: Vascular Surgery

## 2019-09-20 ENCOUNTER — Ambulatory Visit (INDEPENDENT_AMBULATORY_CARE_PROVIDER_SITE_OTHER): Payer: Medicare Other | Admitting: Podiatry

## 2019-09-20 ENCOUNTER — Other Ambulatory Visit: Payer: Self-pay

## 2019-09-20 DIAGNOSIS — B351 Tinea unguium: Secondary | ICD-10-CM

## 2019-09-20 DIAGNOSIS — L84 Corns and callosities: Secondary | ICD-10-CM

## 2019-09-20 DIAGNOSIS — E1151 Type 2 diabetes mellitus with diabetic peripheral angiopathy without gangrene: Secondary | ICD-10-CM

## 2019-09-20 NOTE — Progress Notes (Signed)
Subjective:  Patient ID: Susan Holmes, female    DOB: 1956/07/28,  MRN: [id number]  Chief Complaint  Patient presents with  . Plantar Warts    Pt states plantar warts keep returning after they are trimmed.  . Callouses    Pt states painful callouses on bilateral plantar feet.   63 y.o. female presents with the above complaint. States the calluses came back and are painful.  Review of Systems: Negative except as noted in the HPI. Denies N/V/F/Ch.  Past Medical History:  Diagnosis Date  . Anxiety   . Arthritis   . Chest pain 09/26/2018  . Congenital blindness    Left  eye  . Coronary artery disease    per cardiac cath 2015-- mild disease involving LAD and branches ,  LCFx and branches, and mild disease pRCA  . Depression   . Diabetes mellitus, type II ([place])    followed by pcp  . Glaucoma   . Glaucoma, both eyes   . Hepatitis B infection pt unsure   resolved  . History of transient ischemic attack (TIA) 2017   per pt no residuals  . HTN (hypertension)    followed by pcp  . Hyperlipidemia   . Poor dental hygiene   . Renal calculus, right   . Stroke ([place])   . Tobacco abuse    .5 PPD smoker    Current Outpatient Medications:  .  ACCU-CHEK FASTCLIX LANCETS MISC, CHECK BLOOD SUGAR THREE TIMES DAILY AS DIRECTED, Disp: 306 each, Rfl: 5 .  amLODipine (NORVASC) 10 MG tablet, Take 0.5 tablets (5 mg total) by mouth every morning. (Patient taking differently: Take 5 mg by mouth daily. ), Disp: 45 tablet, Rfl: 2 .  aspirin EC 81 MG tablet, Take 81 mg by mouth daily., Disp: , Rfl:  .  Blood Glucose Monitoring Suppl (ACCU-CHEK GUIDE) w/Device KIT, 1 each by Does not apply route 3 (three) times daily., Disp: 1 kit, Rfl: 0 .  budesonide-formoterol (SYMBICORT) 160-4.5 MCG/ACT inhaler, Inhale 2 puffs into the lungs 2 (two) times daily as needed. (Patient taking differently: Inhale 2 puffs into the lungs 2 (two) times daily as needed (respiratory issues.). ), Disp: 2 Inhaler, Rfl: 0 .   carvedilol (COREG) 12.5 MG tablet, TAKE 1 TABLET(12.5 MG) BY MOUTH TWICE DAILY (Patient taking differently: Take 12.5 mg by mouth 2 (two) times daily. ), Disp: 60 tablet, Rfl: 6 .  chlorthalidone (HYGROTON) 25 MG tablet, Take 12.5 mg by mouth daily., Disp: , Rfl:  .  cyproheptadine (PERIACTIN) 4 MG tablet, Take 4 mg by mouth 2 (two) times daily., Disp: , Rfl:  .  dorzolamide-timolol (COSOPT) 22.3-6.8 MG/ML ophthalmic solution, Place 1 drop into both eyes every evening. , Disp: , Rfl:  .  Dulaglutide (TRULICITY) 1.5 WU/9.8JX SOPN, Inject 1.5 mg into the skin once a week. (Patient taking differently: Inject 1.5 mg into the skin every Saturday. ), Disp: 4 pen, Rfl: 2 .  DULoxetine (CYMBALTA) 30 MG capsule, Take 30 mg by mouth daily. , Disp: , Rfl:  .  hydrOXYzine (ATARAX/VISTARIL) 25 MG tablet, Take 25 mg by mouth 3 (three) times daily as needed for anxiety., Disp: , Rfl:  .  Insulin Glargine (LANTUS SOLOSTAR) 100 UNIT/ML Solostar Pen, Inject 24 Units into the skin at bedtime., Disp: 5 pen, Rfl: 5 .  Insulin Pen Needle (VALUMARK PEN NEEDLES) 31G X 8 MM MISC, 1 each by Does not apply route daily., Disp: 100 each, Rfl: 3 .  losartan (COZAAR) 100 MG  tablet, Take 1 tablet (100 mg total) by mouth daily. (Patient taking differently: Take 100 mg by mouth daily. ), Disp: 90 tablet, Rfl: 3 .  mirtazapine (REMERON) 45 MG tablet, Take 45 mg by mouth at bedtime., Disp: , Rfl:  .  Multiple Vitamin (MULTIVITAMIN WITH MINERALS) TABS tablet, Take 1 tablet by mouth daily., Disp: , Rfl:  .  ONETOUCH ULTRA test strip, USE TO TEST BLOOD GLUCOSE THREE TIMES DAILY, Disp: 100 strip, Rfl: 12 .  oxyCODONE-acetaminophen (PERCOCET/ROXICET) 5-325 MG tablet, Take 1 tablet by mouth every 6 (six) hours as needed., Disp: 6 tablet, Rfl: 0 .  prednisoLONE acetate (PRED FORTE) 1 % ophthalmic suspension, Place 1 drop into the right eye daily., Disp: , Rfl:  .  rosuvastatin (CRESTOR) 10 MG tablet, Take 2 tablets (20 mg total) by mouth  daily., Disp: 90 tablet, Rfl: 1 .  traZODone (DESYREL) 100 MG tablet, Take 300 mg by mouth at bedtime. , Disp: , Rfl:   Social History   Tobacco Use  Smoking Status Current Every Day Smoker  . Packs/day: 0.50  . Years: 39.00  . Pack years: 19.50  . Types: Cigarettes  . Start date: 38  Smokeless Tobacco Current User  . Types: Chew  Tobacco Comment   < half pack per day    Allergies  Allergen Reactions  . Ace Inhibitors Cough  . Chantix [Varenicline] Nausea And Vomiting  . Lipitor [Atorvastatin] Other (See Comments)    MYALGIA    Objective:  There were no vitals filed for this visit. There is no height or weight on file to calculate BMI. Constitutional Well developed. Well nourished.  Vascular Dorsalis pedis pulses palpable bilaterally. Posterior tibial pulses non-palpable bilaterally. Capillary refill normal to all digits.  No cyanosis or clubbing noted. Pedal hair growth normal.  Neurologic Normal speech. Oriented to person, place, and time. Epicritic sensation to light touch grossly present bilaterally.  Dermatologic Nails bilateral hallux s/p removal but with residual spicules. No open wounds. HPK submet 3 left, 2 right, 5 right, right heel  Orthopedic: POP and crepitus 1st MPJ left Hammertoes bilat feet   Radiographs: None Assessment:   1. Diabetes mellitus type 2 with peripheral artery disease (HCC)   2. Callus   3. Onychomycosis    Plan:  Patient was evaluated and treated and all questions answered.  DM with PAD, Onychomycosis -Nails debrided x2 -Calluses pared x4  Procedure: Nail Debridement Rationale: Patient meets criteria for routine foot care due to PAD Type of Debridement: manual, sharp debridement. Instrumentation: Nail nipper, rotary burr. Number of Nails: 2  Procedure: Paring of Lesion Rationale: painful hyperkeratotic lesion Type of Debridement: manual, sharp debridement. Instrumentation: 312 blade Number of Lesions: 4  DM with  PAD, Calluses, HT formation -Attempted to get DM shoes, pt did not want to pay copay.  Return in about 3 months (around 12/20/2019) for Diabetic Foot Care.

## 2019-09-21 DIAGNOSIS — R911 Solitary pulmonary nodule: Secondary | ICD-10-CM | POA: Diagnosis not present

## 2019-09-26 ENCOUNTER — Telehealth: Payer: Self-pay

## 2019-09-26 ENCOUNTER — Other Ambulatory Visit: Payer: Self-pay | Admitting: *Deleted

## 2019-09-26 DIAGNOSIS — I10 Essential (primary) hypertension: Secondary | ICD-10-CM

## 2019-09-26 DIAGNOSIS — E119 Type 2 diabetes mellitus without complications: Secondary | ICD-10-CM

## 2019-09-26 MED ORDER — ACCU-CHEK FASTCLIX LANCETS MISC: 1 refills | Status: DC

## 2019-09-26 MED ORDER — LOSARTAN POTASSIUM 100 MG PO TABS: 100.0000 mg | ORAL_TABLET | Freq: Every day | ORAL | 1 refills | Status: DC

## 2019-09-26 NOTE — Telephone Encounter (Signed)
Patient called stating she was still having some discomfort in her groin and leg. Denies redness, swelling, fever, or drainage. Told her discomfort was normal and to take Tylenol if needed for the discomfort and keep let elevated when she can and call back for any signs of infection or worsening pain.

## 2019-09-27 ENCOUNTER — Telehealth: Payer: Self-pay | Admitting: *Deleted

## 2019-09-27 NOTE — Telephone Encounter (Signed)
Patient called and c/o severe leg pain that has been increasing since angiogram last week. She state no relief with OTC meds. Claim good color, temp and sensation in left leg. My thigh does not feel normal like it has knots in it. Patient instructed to go to [facility] ER for evaluation. She states she can not get there. I gave her an appt with PA for 09/28/2019 however, this does not help her with the severe pain and again encouraged her to be seen in the [facility] ER. Verbalized understanding.

## 2019-09-28 ENCOUNTER — Ambulatory Visit: Payer: Medicare Other

## 2019-09-28 NOTE — Progress Notes (Deleted)
[facility] [place] HISTORY AND PHYSICAL   History of Present Illness:  Patient is a 63 y.o. year old female who presents for evaluation of claudication. She had a several year history of slowly progressive worsening of left leg pain when she ambulates.  She was only able to walk about 1 block before experiencing pain in her left leg. She is now s/p Abdominal aortogram with bilateral lower extremity runoff, attempted left superficial femoral artery stenting, left external iliac stent (7 x 20 self-expanding).  Past Medical History:  Diagnosis Date  . Anxiety   . Arthritis   . Chest pain 09/26/2018  . Congenital blindness    Left  eye  . Coronary artery disease    per cardiac cath 2015-- mild disease involving LAD and branches ,  LCFx and branches, and mild disease pRCA  . Depression   . Diabetes mellitus, type II ([place])    followed by pcp  . Glaucoma   . Glaucoma, both eyes   . Hepatitis B infection pt unsure   resolved  . History of transient ischemic attack (TIA) 2017   per pt no residuals  . HTN (hypertension)    followed by pcp  . Hyperlipidemia   . Poor dental hygiene   . Renal calculus, right   . Stroke ([place])   . Tobacco abuse    .5 PPD smoker    Past Surgical History:  Procedure Laterality Date  . ABDOMINAL AORTOGRAM W/LOWER EXTREMITY N/A 09/14/2019   Procedure: ABDOMINAL AORTOGRAM W/LOWER EXTREMITY;  Surgeon: Elam Dutch, MD;  Location: [facility];  Service: Cardiovascular;  Laterality: N/A;  . ABDOMINAL HYSTERECTOMY  1980s   unsure if ovaries were taken  . GLAUCOMA SURGERY Right 2017  . IR URETERAL STENT RIGHT NEW ACCESS W/O SEP NEPHROSTOMY CATH  10/03/2018  . LEFT HEART CATHETERIZATION WITH CORONARY ANGIOGRAM N/A 10/09/2014   Procedure: LEFT HEART CATHETERIZATION WITH CORONARY ANGIOGRAM;  Surgeon: Troy Sine, MD;  Location: [facility] C[facility] LAB;  Service: Cardiovascular;  Laterality: N/A;  . NEPHROLITHOTOMY Right 10/03/2018   Procedure:  RIGHT NEPHROLITHOTOMY PERCUTANEOUS FIRST STAGE;  Surgeon: Irine Seal, MD;  Location: [facility];  Service: Urology;  Laterality: Right;  . PERIPHERAL VASCULAR INTERVENTION Left 09/14/2019   Procedure: PERIPHERAL VASCULAR INTERVENTION;  Surgeon: Elam Dutch, MD;  Location: [facility];  Service: Cardiovascular;  Laterality: Left;   left external iliac stent   . TEE WITHOUT CARDIOVERSION N/A 08/17/2016   Procedure: TRANSESOPHAGEAL ECHOCARDIOGRAM (TEE);  Surgeon: Sueanne Margarita, MD;  Location: [facility] ENDOSCOPY;  Service: Cardiovascular;  Laterality: N/A;    ROS:   General:  No weight loss, Fever, chills  HEENT: No recent headaches, no nasal bleeding, no visual changes, no sore throat  Neurologic: No dizziness, blackouts, seizures. No recent symptoms of stroke or mini- stroke. No recent episodes of slurred speech, or temporary blindness.  Cardiac: No recent episodes of chest pain/pressure, no shortness of breath at rest.  No shortness of breath with exertion.  Denies history of atrial fibrillation or irregular heartbeat  Vascular: No history of rest pain in feet.  No history of claudication.  No history of non-healing ulcer, No history of DVT   Pulmonary: No home oxygen, no productive cough, no hemoptysis,  No asthma or wheezing  Musculoskeletal:  '[ ]'  Arthritis, '[ ]'  Low back pain,  '[ ]'  Joint pain  Hematologic:No history of hypercoagulable state.  No history of easy bleeding.  No history of anemia  Gastrointestinal: No hematochezia or melena,  No gastroesophageal reflux, no trouble swallowing  Urinary: '[ ]'  chronic Kidney disease, '[ ]'  on HD - '[ ]'  MWF or '[ ]'  TTHS, '[ ]'  Burning with urination, '[ ]'  Frequent urination, '[ ]'  Difficulty urinating;   Skin: No rashes  Psychological: No history of anxiety,  No history of depression  Social History Social History   Tobacco Use  . Smoking status: Current Every Day Smoker    Packs/day: 0.50    Years: 39.00    Pack years: 19.50    Types:  Cigarettes    Start date: 2  . Smokeless tobacco: Current User    Types: Chew  . Tobacco comment: < half pack per day  Substance Use Topics  . Alcohol use: No    Alcohol/week: 0.0 standard drinks  . Drug use: No    Family History Family History  Problem Relation Age of Onset  . Heart disease Mother 51  . Hypertension Mother   . CVA Mother   . Heart disease Father 59  . CVA Sister   . CVA Brother   . Aneurysm Brother   . Aneurysm Sister   . CVA Maternal Grandmother   . CVA Maternal Grandfather   . Colon cancer Neg Hx   . Esophageal cancer Neg Hx   . Stomach cancer Neg Hx     Allergies  Allergies  Allergen Reactions  . Ace Inhibitors Cough  . Chantix [Varenicline] Nausea And Vomiting  . Lipitor [Atorvastatin] Other (See Comments)    MYALGIA      Current Outpatient Medications  Medication Sig Dispense Refill  . Accu-Chek FastClix Lancets MISC CHECK BLOOD SUGAR THREE TIMES DAILY AS DIRECTED. DIAG CODE E 11.65. INSULIN DEPENDENT 306 each 1  . amLODipine (NORVASC) 10 MG tablet Take 0.5 tablets (5 mg total) by mouth every morning. (Patient taking differently: Take 5 mg by mouth daily. ) 45 tablet 2  . aspirin EC 81 MG tablet Take 81 mg by mouth daily.    . Blood Glucose Monitoring Suppl (ACCU-CHEK GUIDE) w/Device KIT 1 each by Does not apply route 3 (three) times daily. 1 kit 0  . budesonide-formoterol (SYMBICORT) 160-4.5 MCG/ACT inhaler Inhale 2 puffs into the lungs 2 (two) times daily as needed. (Patient taking differently: Inhale 2 puffs into the lungs 2 (two) times daily as needed (respiratory issues.). ) 2 Inhaler 0  . carvedilol (COREG) 12.5 MG tablet TAKE 1 TABLET(12.5 MG) BY MOUTH TWICE DAILY (Patient taking differently: Take 12.5 mg by mouth 2 (two) times daily. ) 60 tablet 6  . chlorthalidone (HYGROTON) 25 MG tablet Take 12.5 mg by mouth daily.    . cyproheptadine (PERIACTIN) 4 MG tablet Take 4 mg by mouth 2 (two) times daily.    . dorzolamide-timolol (COSOPT)  22.3-6.8 MG/ML ophthalmic solution Place 1 drop into both eyes every evening.     . Dulaglutide (TRULICITY) 1.5 YN/8.2NF SOPN Inject 1.5 mg into the skin once a week. (Patient taking differently: Inject 1.5 mg into the skin every Saturday. ) 4 pen 2  . DULoxetine (CYMBALTA) 30 MG capsule Take 30 mg by mouth daily.     . hydrOXYzine (ATARAX/VISTARIL) 25 MG tablet Take 25 mg by mouth 3 (three) times daily as needed for anxiety.    . Insulin Glargine (LANTUS SOLOSTAR) 100 UNIT/ML Solostar Pen Inject 24 Units into the skin at bedtime. 5 pen 5  . Insulin Pen Needle (VALUMARK PEN NEEDLES) 31G X 8 MM MISC 1 each  by Does not apply route daily. 100 each 3  . losartan (COZAAR) 100 MG tablet Take 1 tablet (100 mg total) by mouth daily. 90 tablet 1  . mirtazapine (REMERON) 45 MG tablet Take 45 mg by mouth at bedtime.    . Multiple Vitamin (MULTIVITAMIN WITH MINERALS) TABS tablet Take 1 tablet by mouth daily.    Glory Rosebush ULTRA test strip USE TO TEST BLOOD GLUCOSE THREE TIMES DAILY 100 strip 12  . oxyCODONE-acetaminophen (PERCOCET/ROXICET) 5-325 MG tablet Take 1 tablet by mouth every 6 (six) hours as needed. 6 tablet 0  . prednisoLONE acetate (PRED FORTE) 1 % ophthalmic suspension Place 1 drop into the right eye daily.    . rosuvastatin (CRESTOR) 10 MG tablet Take 2 tablets (20 mg total) by mouth daily. 90 tablet 1  . traZODone (DESYREL) 100 MG tablet Take 300 mg by mouth at bedtime.      No current facility-administered medications for this visit.     Physical Examination  There were no vitals filed for this visit.  There is no height or weight on file to calculate BMI.  General:  Alert and oriented, no acute distress HEENT: Normal Neck: No bruit or JVD Pulmonary: Clear to auscultation bilaterally Cardiac: Regular Rate and Rhythm without murmur Abdomen: Soft, non-tender, non-distended, no mass, no scars Skin: No rash Extremity Pulses:  2+ radial, brachial, femoral, dorsalis pedis, posterior  tibial pulses bilaterally Musculoskeletal: No deformity or edema  Neurologic: Upper and lower extremity motor 5/5 and symmetric  DATA: ***   ASSESSMENT: ***   PLAN: ***   Ruta Hinds, MD [facility] [place] Office: [phone] Pager: [phone]

## 2019-10-04 ENCOUNTER — Telehealth: Payer: Self-pay | Admitting: Internal Medicine

## 2019-10-04 ENCOUNTER — Other Ambulatory Visit: Payer: Self-pay

## 2019-10-04 ENCOUNTER — Ambulatory Visit (INDEPENDENT_AMBULATORY_CARE_PROVIDER_SITE_OTHER): Payer: Medicare Other | Admitting: Cardiology

## 2019-10-04 ENCOUNTER — Encounter: Payer: Self-pay | Admitting: Cardiology

## 2019-10-04 ENCOUNTER — Telehealth: Payer: Self-pay

## 2019-10-04 DIAGNOSIS — E119 Type 2 diabetes mellitus without complications: Secondary | ICD-10-CM | POA: Diagnosis not present

## 2019-10-04 DIAGNOSIS — F17200 Nicotine dependence, unspecified, uncomplicated: Secondary | ICD-10-CM

## 2019-10-04 DIAGNOSIS — I10 Essential (primary) hypertension: Secondary | ICD-10-CM | POA: Diagnosis not present

## 2019-10-04 DIAGNOSIS — Z87898 Personal history of other specified conditions: Secondary | ICD-10-CM

## 2019-10-04 DIAGNOSIS — E785 Hyperlipidemia, unspecified: Secondary | ICD-10-CM

## 2019-10-04 DIAGNOSIS — J45909 Unspecified asthma, uncomplicated: Secondary | ICD-10-CM

## 2019-10-04 DIAGNOSIS — I739 Peripheral vascular disease, unspecified: Secondary | ICD-10-CM | POA: Diagnosis not present

## 2019-10-04 DIAGNOSIS — E1169 Type 2 diabetes mellitus with other specified complication: Secondary | ICD-10-CM

## 2019-10-04 DIAGNOSIS — Z01810 Encounter for preprocedural cardiovascular examination: Secondary | ICD-10-CM

## 2019-10-04 DIAGNOSIS — Z794 Long term (current) use of insulin: Secondary | ICD-10-CM

## 2019-10-04 DIAGNOSIS — Z8673 Personal history of transient ischemic attack (TIA), and cerebral infarction without residual deficits: Secondary | ICD-10-CM

## 2019-10-04 NOTE — Assessment & Plan Note (Signed)
LDL 133 Oct 2019- currently on Crestor 20mg   Consider PCSK9 Rx

## 2019-10-04 NOTE — Assessment & Plan Note (Signed)
Pt seen today for pre op clearance prior to PV surgery

## 2019-10-04 NOTE — Patient Instructions (Signed)
Medication Instructions:  Your physician recommends that you continue on your current medications as directed. Please refer to the Current Medication list given to you today.  If you need a refill on your cardiac medications before your next appointment, please call your pharmacy.   Lab work: None  If you have labs (blood work) drawn today and your tests are completely normal, you will receive your results only by: Marland Kitchen MyChart Message (if you have MyChart) OR . A paper copy in the mail If you have any lab test that is abnormal or we need to change your treatment, we will call you to review the results.  Testing/Procedures: None   Follow-Up: At [facility], you and your health needs are our priority.  As part of our continuing mission to provide you with exceptional heart care, we have created designated Provider Care Teams.  These Care Teams include your primary Cardiologist (physician) and Advanced Practice Providers (APPs -  Physician Assistants and Nurse Practitioners) who all work together to provide you with the care you need, when you need it. You will need a follow up appointment in 6 months.  Please call our office 2 months in advance to schedule this appointment.  You may see Dr Skeet Latch or one of the following Advanced Practice Providers on your designated Care Team:   Kerin Ransom, PA-C Roby Lofts, [place] . Sande Rives, PA-C  Any Other Special Instructions Will Be Listed Below (If Applicable). YOU ARE CLEARED FOR YOUR UPCOMING PROCEDURE

## 2019-10-04 NOTE — Assessment & Plan Note (Signed)
Non obstructive CAD in 2015, negative pre op Myoview Oct 2019

## 2019-10-04 NOTE — Telephone Encounter (Signed)
Agree with plan.  Would encourage her to come in and be evaluated tomorrow if she can arrange transportation.

## 2019-10-04 NOTE — Assessment & Plan Note (Signed)
Normal renal function

## 2019-10-04 NOTE — Assessment & Plan Note (Signed)
She tells me she is going to quit- using patches

## 2019-10-04 NOTE — Telephone Encounter (Signed)
   [facility] Pre-operative Risk Assessment    Request for surgical clearance:  1. What type of surgery is being performed? Left Femoral-popliteal bypass   2. When is this surgery scheduled? 10/22/2019  3. What type of clearance is required (medical clearance vs. Pharmacy clearance to hold med vs. Both)? Medical  4. Are there any medications that need to be held prior to surgery and how long? TBD  5. Practice name and name of physician performing surgery? [facility]  6. What is your office phone number [phone]   7.   What is your office fax number [phone]  8.   Anesthesia type (None, local, MAC, general) ? General Anesthesia    Ulice Brilliant T 10/04/2019, 4:36 PM  _________________________________________________________________   (provider comments below)

## 2019-10-04 NOTE — Assessment & Plan Note (Signed)
Stable- prior pulmonary evaluation 2015

## 2019-10-04 NOTE — Assessment & Plan Note (Addendum)
Pt to have LFPBG after unsuccessful attempt at LtSFA stenting 09/14/2019

## 2019-10-04 NOTE — Telephone Encounter (Signed)
Pt is requesting a callback [phone]

## 2019-10-04 NOTE — Assessment & Plan Note (Signed)
Rt brain CVA 2017

## 2019-10-04 NOTE — Progress Notes (Signed)
Cardiology Office Note:    Date:  10/04/2019   ID:  Susan Holmes, DOB 1956/08/21, MRN [id number]  PCP:  [name], MD  Cardiologist:  Skeet Latch, MD  Electrophysiologist:  None   Referring MD: [name], MD   Chief Complaint  Patient presents with  . Follow-up    cardiac clearance    History of Present Illness:    Susan Holmes is a 63 y.o. female with a hx of insulin-dependent diabetes, hypertension, dyslipidemia, prior right brain CVA in 2017, and a past history of chest pain.  She had nonobstructive coronary disease at catheterization in 2015.  She had a Myoview in October 2019 that was negative for ischemia, her EF was 57%.  She did have a CAT scan of her chest in May 2020 that showed coronary calcifications.  Patient also has vascular disease.  She underwent attempted left SFA stenting 09/14/2019 by Dr. Oneida Alar.  This was unsuccessful and she is to have left femoropopliteal bypass grafting.  She is here today for preoperative clearance.  Patient tells me she does not have chest pain with any of her usual activities.  She is able to do her own housework and go up and down stairs and walk around the block.  She still smoking but plans on quitting using patches.  Past Medical History:  Diagnosis Date  . Anxiety   . Arthritis   . Chest pain 09/26/2018  . Congenital blindness    Left  eye  . Coronary artery disease    per cardiac cath 2015-- mild disease involving LAD and branches ,  LCFx and branches, and mild disease pRCA  . Depression   . Diabetes mellitus, type II ([place])    followed by pcp  . Glaucoma   . Glaucoma, both eyes   . Hepatitis B infection pt unsure   resolved  . History of transient ischemic attack (TIA) 2017   per pt no residuals  . HTN (hypertension)    followed by pcp  . Hyperlipidemia   . Poor dental hygiene   . Renal calculus, right   . Stroke ([place])   . Tobacco abuse    .5 PPD smoker    Past Surgical History:  Procedure Laterality  Date  . ABDOMINAL AORTOGRAM W/LOWER EXTREMITY N/A 09/14/2019   Procedure: ABDOMINAL AORTOGRAM W/LOWER EXTREMITY;  Surgeon: Elam Dutch, MD;  Location: [facility];  Service: Cardiovascular;  Laterality: N/A;  . ABDOMINAL HYSTERECTOMY  1980s   unsure if ovaries were taken  . GLAUCOMA SURGERY Right 2017  . IR URETERAL STENT RIGHT NEW ACCESS W/O SEP NEPHROSTOMY CATH  10/03/2018  . LEFT HEART CATHETERIZATION WITH CORONARY ANGIOGRAM N/A 10/09/2014   Procedure: LEFT HEART CATHETERIZATION WITH CORONARY ANGIOGRAM;  Surgeon: Troy Sine, MD;  Location: [facility] CATH LAB;  Service: Cardiovascular;  Laterality: N/A;  . NEPHROLITHOTOMY Right 10/03/2018   Procedure: RIGHT NEPHROLITHOTOMY PERCUTANEOUS FIRST STAGE;  Surgeon: Irine Seal, MD;  Location: [facility];  Service: Urology;  Laterality: Right;  . PERIPHERAL VASCULAR INTERVENTION Left 09/14/2019   Procedure: PERIPHERAL VASCULAR INTERVENTION;  Surgeon: Elam Dutch, MD;  Location: [facility];  Service: Cardiovascular;  Laterality: Left;   left external iliac stent   . TEE WITHOUT CARDIOVERSION N/A 08/17/2016   Procedure: TRANSESOPHAGEAL ECHOCARDIOGRAM (TEE);  Surgeon: Sueanne Margarita, MD;  Location: [facility] ENDOSCOPY;  Service: Cardiovascular;  Laterality: N/A;    Current Medications: Current Meds  Medication Sig  . Accu-Chek FastClix Lancets MISC  CHECK BLOOD SUGAR THREE TIMES DAILY AS DIRECTED. DIAG CODE E 11.65. INSULIN DEPENDENT  . amLODipine (NORVASC) 10 MG tablet Take 0.5 tablets (5 mg total) by mouth every morning. (Patient taking differently: Take 5 mg by mouth daily. )  . aspirin EC 81 MG tablet Take 81 mg by mouth daily.  . Blood Glucose Monitoring Suppl (ACCU-CHEK GUIDE) w/Device KIT 1 each by Does not apply route 3 (three) times daily.  . budesonide-formoterol (SYMBICORT) 160-4.5 MCG/ACT inhaler Inhale 2 puffs into the lungs 2 (two) times daily as needed. (Patient taking differently: Inhale 2 puffs into the lungs 2 (two) times  daily as needed (respiratory issues.). )  . carvedilol (COREG) 12.5 MG tablet TAKE 1 TABLET(12.5 MG) BY MOUTH TWICE DAILY (Patient taking differently: Take 12.5 mg by mouth 2 (two) times daily. )  . chlorthalidone (HYGROTON) 25 MG tablet Take 12.5 mg by mouth daily.  . cyproheptadine (PERIACTIN) 4 MG tablet Take 4 mg by mouth 2 (two) times daily.  . dorzolamide-timolol (COSOPT) 22.3-6.8 MG/ML ophthalmic solution Place 1 drop into both eyes every evening.   . Dulaglutide (TRULICITY) 1.5 GG/2.6RS SOPN Inject 1.5 mg into the skin once a week. (Patient taking differently: Inject 1.5 mg into the skin every Saturday. )  . DULoxetine (CYMBALTA) 30 MG capsule Take 30 mg by mouth daily.   . hydrOXYzine (ATARAX/VISTARIL) 25 MG tablet Take 25 mg by mouth 3 (three) times daily as needed for anxiety.  . Insulin Glargine (LANTUS SOLOSTAR) 100 UNIT/ML Solostar Pen Inject 24 Units into the skin at bedtime.  . Insulin Pen Needle (VALUMARK PEN NEEDLES) 31G X 8 MM MISC 1 each by Does not apply route daily.  Marland Kitchen losartan (COZAAR) 100 MG tablet Take 1 tablet (100 mg total) by mouth daily.  . mirtazapine (REMERON) 45 MG tablet Take 45 mg by mouth at bedtime.  . Multiple Vitamin (MULTIVITAMIN WITH MINERALS) TABS tablet Take 1 tablet by mouth daily.  Glory Rosebush ULTRA test strip USE TO TEST BLOOD GLUCOSE THREE TIMES DAILY  . oxyCODONE-acetaminophen (PERCOCET/ROXICET) 5-325 MG tablet Take 1 tablet by mouth every 6 (six) hours as needed.  . prednisoLONE acetate (PRED FORTE) 1 % ophthalmic suspension Place 1 drop into the right eye daily.  . rosuvastatin (CRESTOR) 10 MG tablet Take 2 tablets (20 mg total) by mouth daily.  . traZODone (DESYREL) 100 MG tablet Take 300 mg by mouth at bedtime.      Allergies:   Ace inhibitors, Chantix [varenicline], and Lipitor [atorvastatin]   Social History   Socioeconomic History  . Marital status: Divorced    Spouse name: Not on file  . Number of children: 1  . Years of education: 57   . Highest education level: Not on file  Occupational History    Employer: UNEMPLOYED  Social Needs  . Financial resource strain: Not on file  . Food insecurity    Worry: Not on file    Inability: Not on file  . Transportation needs    Medical: Not on file    Non-medical: Not on file  Tobacco Use  . Smoking status: Current Every Day Smoker    Packs/day: 0.50    Years: 39.00    Pack years: 19.50    Types: Cigarettes    Start date: 60  . Smokeless tobacco: Current User    Types: Chew  . Tobacco comment: < half pack per day  Substance and Sexual Activity  . Alcohol use: No    Alcohol/week: 0.0 standard drinks  .  Drug use: No  . Sexual activity: Not Currently    Birth control/protection: Surgical  Lifestyle  . Physical activity    Days per week: Not on file    Minutes per session: Not on file  . Stress: Not on file  Relationships  . Social Herbalist on phone: Not on file    Gets together: Not on file    Attends religious service: Not on file    Active member of club or organization: Not on file    Attends meetings of clubs or organizations: Not on file    Relationship status: Not on file  Other Topics Concern  . Not on file  Social History Narrative  . Not on file     Family History: The patient's family history includes Aneurysm in her brother and sister; CVA in her brother, maternal grandfather, maternal grandmother, mother, and sister; Heart disease (age of onset: 76) in her father and mother; Hypertension in her mother. There is no history of Colon cancer, Esophageal cancer, or Stomach cancer.  ROS:   Please see the history of present illness.     All other systems reviewed and are negative.  EKGs/Labs/Other Studies Reviewed:    The following studies were reviewed today: Myoview Oct 2019  EKG:  EKG is ordered today.  The ekg ordered today demonstrates NSR, 76, NSST changes  Recent Labs: 05/22/2019: ALT 23; Platelets 210.0 09/14/2019: BUN 10;  Creatinine, Ser 0.70; Hemoglobin 14.3; Potassium 3.8; Sodium 139  Recent Lipid Panel    Component Value Date/Time   CHOL 206 (H) 09/26/2018 0949   TRIG 100 09/26/2018 0949   HDL 53 09/26/2018 0949   CHOLHDL 3.9 09/26/2018 0949   CHOLHDL 3.4 04/26/2017 1403   VLDL 31 (H) 04/26/2017 1403   LDLCALC 133 (H) 09/26/2018 0949   LDLDIRECT 183 (H) 03/01/2014 0500    Physical Exam:    VS:  BP (!) 136/96   Pulse 76   Temp (!) 96.7 F (35.9 C) (Temporal)   Ht _0  (1.702 m)   Wt 157 lb (71.2 kg)   SpO2 95%   BMI 24.59 kg/m     Wt Readings from Last 3 Encounters:  10/04/19 157 lb (71.2 kg)  09/14/19 160 lb (72.6 kg)  09/06/19 160 lb 4.8 oz (72.7 kg)     GEN:  Well nourished, well developed in no acute distress HEENT: Normal NECK: No JVD; No carotid bruits LYMPHATICS: No lymphadenopathy CARDIAC: RRR, no murmurs, rubs, gallops RESPIRATORY:  Clear to auscultation without rales, wheezing or rhonchi  ABDOMEN: Soft, non-tender, non-distended, bifemoral bruits, 1-2+ PT on Rt, diminished LLE pulses MUSCULOSKELETAL:  No edema; No deformity  SKIN: Warm and dry NEUROLOGIC:  Alert and oriented x 3 PSYCHIATRIC:  Normal affect   ASSESSMENT:    Pre-operative cardiovascular examination Pt seen today for pre op clearance prior to PV surgery  History of chest pain Non obstructive CAD in 2015, negative pre op Myoview Oct 2019  PVD (peripheral vascular disease) ([place]) Pt to have LFPBG after unsuccessful attempt at LtSFA stenting 09/14/2019  Insulin dependent type 2 diabetes mellitus ([place]) Normal renal function  Essential hypertension Repeat B/P by me 124/76 Lt arm  Hyperlipidemia associated with type 2 diabetes mellitus ([place])  LDL 133 Oct 2019- currently on Crestor 12m  Consider PCSK9 Rx  History of CVA (cerebrovascular accident) Rt brain CVA 2017  TOBACCO ABUSE She tells me she is going to quit- using patches  Reactive airway disease Stable-  prior pulmonary evaluation  2015  PLAN:    Pt is an acceptable risk for planned surgery from cardiac standpoint.  I will forward clearance to dr Oneida Alar via Epic.   Consider PCSK9- I believe she would qualify with PVD, H/O CVA, and IDDM-  Medication Adjustments/Labs and Tests Ordered: Current medicines are reviewed at length with the patient today.  Concerns regarding medicines are outlined above.  No orders of the defined types were placed in this encounter.  No orders of the defined types were placed in this encounter.   Patient Instructions  Medication Instructions:  Your physician recommends that you continue on your current medications as directed. Please refer to the Current Medication list given to you today.  If you need a refill on your cardiac medications before your next appointment, please call your pharmacy.   Lab work: None  If you have labs (blood work) drawn today and your tests are completely normal, you will receive your results only by: Marland Kitchen MyChart Message (if you have MyChart) OR . A paper copy in the mail If you have any lab test that is abnormal or we need to change your treatment, we will call you to review the results.  Testing/Procedures: None   Follow-Up: At [facility], you and your health needs are our priority.  As part of our continuing mission to provide you with exceptional heart care, we have created designated Provider Care Teams.  These Care Teams include your primary Cardiologist (physician) and Advanced Practice Providers (APPs -  Physician Assistants and Nurse Practitioners) who all work together to provide you with the care you need, when you need it. You will need a follow up appointment in 6 months.  Please call our office 2 months in advance to schedule this appointment.  You may see Dr Skeet Latch or one of the following Advanced Practice Providers on your designated Care Team:   Kerin Ransom, PA-C Roby Lofts, [place] . Sande Rives, PA-C  Any Other  Special Instructions Will Be Listed Below (If Applicable). YOU ARE [facility] FOR YOUR UPCOMING PROCEDURE     Signed, Kerin Ransom, PA-C  10/04/2019 9:13 AM    [place]

## 2019-10-04 NOTE — Telephone Encounter (Signed)
Pt calsl and states since her procedure she feels very tired and cbg readings have been as high as 400. She ask to come in for an appt and to call dr fields office also. She cannot come in office until next week due to transportation issues, appt made in [facility] for next week. Advised if she has chest pain, shortness of breath, severe h/a, weakness of limbs, change in speech, vision to please call 911. She is agreeable. Sending to dr fields also. pcp and imc attending

## 2019-10-04 NOTE — Assessment & Plan Note (Signed)
Repeat B/P by me 124/76 Lt arm

## 2019-10-04 NOTE — Telephone Encounter (Signed)
Patient seen in office today and cleared and office note sent to Dr Oneida Alar.

## 2019-10-05 ENCOUNTER — Other Ambulatory Visit: Payer: Self-pay | Admitting: *Deleted

## 2019-10-05 ENCOUNTER — Other Ambulatory Visit: Payer: Self-pay | Admitting: Cardiovascular Disease

## 2019-10-05 NOTE — Telephone Encounter (Signed)
Please advise if ok to refill Chlorthalidone 25 mg 1/2 tablet qd. Last filled Historical provider.

## 2019-10-05 NOTE — Progress Notes (Signed)
Call to patient and instructed to be at [facility] admitting at 5:30 am on 10/22/2019 for surgery with Dr. Oneida Alar. NPO past MN night prior. Expect a call and follow the instructions for insulin adjustment, medications, surgery and pre-op nasal swab testing received from the hospital preadmission testing department. Patient repeated back instruction to this nurse. To call this office if any questions.

## 2019-10-08 ENCOUNTER — Other Ambulatory Visit: Payer: Self-pay

## 2019-10-08 NOTE — Telephone Encounter (Signed)
Refilled as requested  

## 2019-10-08 NOTE — Telephone Encounter (Signed)
Dr.Peters patient- okay to refill?

## 2019-10-09 ENCOUNTER — Telehealth: Payer: Self-pay

## 2019-10-09 NOTE — Telephone Encounter (Signed)
Patient notified and voiced understanding. Message sent to schedulers to schedule an appt.

## 2019-10-09 NOTE — Telephone Encounter (Signed)
-----   Message from Erlene Quan, [place] sent at 10/04/2019 10:27 AM EDT ----- T can you let Susan Holmes know we are going to refer her to our pharmacist for cholesterol Rx.  Her cholesterol is not controlled well.  This can be in 4-6 weeks, she is going to have surgery in the next week or two  Thanks  Luke

## 2019-10-09 NOTE — Addendum Note (Signed)
Addended by: Ulice Brilliant T on: 10/09/2019 09:53 AM   Modules accepted: Orders

## 2019-10-10 ENCOUNTER — Other Ambulatory Visit: Payer: Self-pay

## 2019-10-10 ENCOUNTER — Ambulatory Visit (INDEPENDENT_AMBULATORY_CARE_PROVIDER_SITE_OTHER): Payer: Medicare Other | Admitting: Internal Medicine

## 2019-10-10 VITALS — BP 163/95 | HR 72 | Temp 98.3°F | Wt 157.7 lb

## 2019-10-10 DIAGNOSIS — E119 Type 2 diabetes mellitus without complications: Secondary | ICD-10-CM | POA: Diagnosis not present

## 2019-10-10 DIAGNOSIS — Z794 Long term (current) use of insulin: Secondary | ICD-10-CM | POA: Diagnosis not present

## 2019-10-10 DIAGNOSIS — Z23 Encounter for immunization: Secondary | ICD-10-CM | POA: Diagnosis not present

## 2019-10-10 DIAGNOSIS — E1165 Type 2 diabetes mellitus with hyperglycemia: Secondary | ICD-10-CM

## 2019-10-10 DIAGNOSIS — R0981 Nasal congestion: Secondary | ICD-10-CM | POA: Diagnosis not present

## 2019-10-10 LAB — GLUCOSE, CAPILLARY: Glucose-Capillary: 282 mg/dL — ABNORMAL HIGH (ref 70–99)

## 2019-10-10 LAB — POCT GLYCOSYLATED HEMOGLOBIN (HGB A1C): Hemoglobin A1C: 10.2 % — AB (ref 4.0–5.6)

## 2019-10-10 MED ORDER — LANTUS SOLOSTAR 100 UNIT/ML ~~LOC~~ SOPN: 28.0000 [IU] | PEN_INJECTOR | Freq: Every day | SUBCUTANEOUS | 5 refills | Status: DC

## 2019-10-10 MED ORDER — NOVOLOG FLEXPEN 100 UNIT/ML ~~LOC~~ SOPN: 5.0000 [IU] | PEN_INJECTOR | Freq: Three times a day (TID) | SUBCUTANEOUS | 11 refills | Status: DC

## 2019-10-10 MED ORDER — FLUTICASONE PROPIONATE 50 MCG/ACT NA SUSP: 1.0000 | Freq: Every day | NASAL | 2 refills | Status: DC

## 2019-10-10 NOTE — Patient Instructions (Signed)
Thank you for allowing [place] to provide your care. Today we are:  1) Increasing your lantus to 28 units daily   2) Starting mealtime insulin, Novolog. Take 5 units with each meal.   3) Please schedule a telephone call with me in 1 week

## 2019-10-11 DIAGNOSIS — R0981 Nasal congestion: Secondary | ICD-10-CM | POA: Insufficient documentation

## 2019-10-11 NOTE — Assessment & Plan Note (Signed)
Patient presents for evaluation of persistent hyperglycemia. She states that about three weeks ago she noticed her blood sugar was in the 400s. Since that time has consistently been between 400 to 500. She has had no recent change in medications and is currently on Trulicity 1.5 mg weekly and Lantus 24 units daily. She states that she does not miss any of her medications. She stores her Lantus in the fridge. She injects in the same spot on her abdomen daily and allows five seconds before removing the pen. She is never noticed a bead of insulin at the end of her pen after injecting. During this three week time course she has switched out Lantus pens. With her hyperglycemia she has had increased polyuria, polydipsia, and fatigue/malaise. She has been drinking a lot of water. She is not been drinking juices or sodas. She has never been hospitalized for hyperglycemia and never been told she had DKA. She denies signs or symptoms of infection. She denies chest pain or shortness of breath. She denies focal neurological deficits.  Review of records indicates that she presented identically to this earlier this year. BMP at the time was without anion gap and there were no ketones noted in her urine. CBG today is 282. On physical exam she appears hypovolemia.  A/P: - Will increase her Lantus to 28 units QD  - START Novolog 5 units TID WC  - Continue Trulicity  - Follow-up in 1 week.   After patient left called to asked if she could return for lab work. Unfortunately she was already home and declined to come back.

## 2019-10-11 NOTE — Progress Notes (Signed)
   CC: Hyperglycemia  HPI:  [name] is a 63 y.o. female with PMHx listed below presenting for hyperglycemia. Please see the A&P for the status of the patient's chronic medical problems.  Past Medical History:  Diagnosis Date  . Anxiety   . Arthritis   . Chest pain 09/26/2018  . Congenital blindness    Left  eye  . Coronary artery disease    per cardiac cath 2015-- mild disease involving LAD and branches ,  LCFx and branches, and mild disease pRCA  . Depression   . Diabetes mellitus, type II ([place])    followed by pcp  . Glaucoma   . Glaucoma, both eyes   . Hepatitis B infection pt unsure   resolved  . History of transient ischemic attack (TIA) 2017   per pt no residuals  . HTN (hypertension)    followed by pcp  . Hyperlipidemia   . Poor dental hygiene   . Renal calculus, right   . Stroke ([place])   . Tobacco abuse    .5 PPD smoker   Review of Systems:  Performed and all others negative.  Physical Exam: Vitals:   10/10/19 1010  BP: (!) 163/95  Pulse: 72  Temp: 98.3 F (36.8 C)  TempSrc: Oral  SpO2: 100%  Weight: 157 lb 11.2 oz (71.5 kg)   General: Well nourished female in no acute distress HENT: Dry mucus membranes Pulm: Good air movement with no wheezing or crackles  CV: RRR, no murmurs, no rubs  Abd: No subcutaneous nodules noted on abdomen  Skin: Increased turgor,   Neuro: Alert and oriented x 3  Assessment & Plan:   See Encounters Tab for problem based charting.  Patient discussed with Dr. Evette Doffing

## 2019-10-11 NOTE — Progress Notes (Signed)
Internal Medicine Clinic Attending  Case discussed with Dr. Helberg at the time of the visit.  We reviewed the resident's history and exam and pertinent patient test results.  I agree with the assessment, diagnosis, and plan of care documented in the resident's note.    

## 2019-10-11 NOTE — Assessment & Plan Note (Signed)
Patient voices concerns about sinus congestion. She has had it in the past but has never tried anything for it. We discussed trying Flonase. She is prescribed this medication but states that she does not use it.

## 2019-10-15 NOTE — Progress Notes (Signed)
[facility] [place], [address] [place] [address] Phone: [phone] Fax: [phone]      Your procedure is scheduled on October 26  Report to [facility] Main Entrance "A" at 0530 A.M., and check in at the Admitting office.  Call this number if you have problems the morning of surgery:  [phone]  Call [phone] if you have any questions prior to your surgery date Monday-Friday 8am-4pm    Remember:  Do not eat or drink after midnight the night before your surgery     Take these medicines the morning of surgery with A SIP OF WATER  amLODipine (NORVASC) budesonide-formoterol (SYMBICORT) carvedilol (COREG)  chlorthalidone (HYGROTON) cyproheptadine (PERIACTIN) Eye drops if needed DULoxetine (CYMBALTA) fluticasone (FLONASE) rosuvastatin (CRESTOR)   7 days prior to surgery STOP taking any Aspirin (unless otherwise instructed by your surgeon), Aleve, Naproxen, Ibuprofen, Motrin, Advil, Goody's, BC's, all herbal medications, fish oil, and all vitamins.   WHAT DO I DO ABOUT MY DIABETES MEDICATION?   Marland Kitchen Do not take oral diabetes medicines (pills) the morning of surgery.  . THE NIGHT BEFORE SURGERY, take ____14_______ units of __Insulin Glargine (LANTUS SOLOSTAR)_________insulin.     . The day of surgery, do not take other diabetes injectables, including Byetta (exenatide), Bydureon (exenatide ER), Victoza (liraglutide), or Trulicity (dulaglutide).  . If your CBG is greater than 220 mg/dL, you may take  of your sliding scale (correction) dose of insulin. insulin aspart (NOVOLOG FLEXPEN)   How to Manage Your Diabetes Before and After Surgery  Why is it important to control my blood sugar before and after surgery? . Improving blood sugar levels before and after surgery helps healing and can limit problems. . A way of improving blood sugar control is eating a healthy  diet by: o  Eating less sugar and carbohydrates o  Increasing activity/exercise o  Talking with your doctor about reaching your blood sugar goals . High blood sugars (greater than 180 mg/dL) can raise your risk of infections and slow your recovery, so you will need to focus on controlling your diabetes during the weeks before surgery. . Make sure that the doctor who takes care of your diabetes knows about your planned surgery including the date and location.  How do I manage my blood sugar before surgery? . Check your blood sugar at least 4 times a day, starting 2 days before surgery, to make sure that the level is not too high or low. o Check your blood sugar the morning of your surgery when you wake up and every 2 hours until you get to the Short Stay unit. . If your blood sugar is less than 70 mg/dL, you will need to treat for low blood sugar: o Do not take insulin. o Treat a low blood sugar (less than 70 mg/dL) with  cup of clear juice (cranberry or apple), 4 glucose tablets, OR glucose gel. o Recheck blood sugar in 15 minutes after treatment (to make sure it is greater than 70 mg/dL). If your blood sugar is not greater than 70 mg/dL on recheck, call [phone] for further instructions. . Report your blood sugar to the short stay nurse when you get to Short Stay.  . If you are admitted to the hospital after surgery: o Your blood sugar will be checked by the staff and you will probably be given insulin after surgery (instead of oral  diabetes medicines) to make sure you have good blood sugar levels. o The goal for blood sugar control after surgery is 80-180 mg/dL.    The Morning of Surgery  Do not wear jewelry, make-up or nail polish.  Do not wear lotions, powders, or perfumes/colognes, or deodorant  Do not shave 48 hours prior to surgery.   Do not bring valuables to the hospital.  [facility] is not responsible for any belongings or valuables.  If you are a smoker, DO NOT Smoke 24  hours prior to surgery IF you wear a CPAP at night please bring your mask, tubing, and machine the morning of surgery   Remember that you must have someone to transport you home after your surgery, and remain with you for 24 hours if you are discharged the same day.   Contacts, glasses, hearing aids, dentures or bridgework may not be worn into surgery.    Leave your suitcase in the car.  After surgery it may be brought to your room.  For patients admitted to the hospital, discharge time will be determined by your treatment team.  Patients discharged the day of surgery will not be allowed to drive home.    Special instructions:   [place]- Preparing For Surgery  Before surgery, you can play an important role. Because skin is not sterile, your skin needs to be as free of germs as possible. You can reduce the number of germs on your skin by washing with CHG (chlorahexidine gluconate) Soap before surgery.  CHG is an antiseptic cleaner which kills germs and bonds with the skin to continue killing germs even after washing.    Oral Hygiene is also important to reduce your risk of infection.  Remember - BRUSH YOUR TEETH THE MORNING OF SURGERY WITH YOUR REGULAR TOOTHPASTE  Please do not use if you have an allergy to CHG or antibacterial soaps. If your skin becomes reddened/irritated stop using the CHG.  Do not shave (including legs and underarms) for at least 48 hours prior to first CHG shower. It is OK to shave your face.  Please follow these instructions carefully.   1. Shower the NIGHT BEFORE SURGERY and the MORNING OF SURGERY with CHG Soap.   2. If you chose to wash your hair, wash your hair first as usual with your normal shampoo.  3. After you shampoo, rinse your hair and body thoroughly to remove the shampoo.  4. Use CHG as you would any other liquid soap. You can apply CHG directly to the skin and wash gently with a scrungie or a clean washcloth.   5. Apply the CHG Soap to your  body ONLY FROM THE NECK DOWN.  Do not use on open wounds or open sores. Avoid contact with your eyes, ears, mouth and genitals (private parts). Wash Face and genitals (private parts)  with your normal soap.   6. Wash thoroughly, paying special attention to the area where your surgery will be performed.  7. Thoroughly rinse your body with warm water from the neck down.  8. DO NOT shower/wash with your normal soap after using and rinsing off the CHG Soap.  9. Pat yourself dry with a CLEAN TOWEL.  10. Wear CLEAN PAJAMAS to bed the night before surgery, wear comfortable clothes the morning of surgery  11. Place CLEAN SHEETS on your bed the night of your first shower and DO NOT SLEEP WITH PETS.    Day of Surgery:  Do not apply any deodorants/lotions. Please shower  the morning of surgery with the CHG soap  Please wear clean clothes to the hospital/surgery center.   Remember to brush your teeth WITH YOUR REGULAR TOOTHPASTE.   Please read over the following fact sheets that you were given.

## 2019-10-16 ENCOUNTER — Encounter (HOSPITAL_COMMUNITY): Payer: Self-pay

## 2019-10-16 ENCOUNTER — Other Ambulatory Visit: Payer: Self-pay

## 2019-10-16 ENCOUNTER — Encounter (HOSPITAL_COMMUNITY)
Admission: RE | Admit: 2019-10-16 | Discharge: 2019-10-16 | Disposition: A | Discharge status: Home / Self Care | Payer: Medicare Other | Source: Ambulatory Visit | Attending: Vascular Surgery | Admitting: Vascular Surgery

## 2019-10-16 DIAGNOSIS — Z01812 Encounter for preprocedural laboratory examination: Secondary | ICD-10-CM | POA: Diagnosis not present

## 2019-10-16 HISTORY — DX: Personal history of urinary calculi: Z87.442

## 2019-10-16 LAB — COMPREHENSIVE METABOLIC PANEL
ALT: 12 U/L (ref 0–44)
AST: 14 U/L — ABNORMAL LOW (ref 15–41)
Albumin: 3.7 g/dL (ref 3.5–5.0)
Alkaline Phosphatase: 94 U/L (ref 38–126)
Anion gap: 9 (ref 5–15)
BUN: 5 mg/dL — ABNORMAL LOW (ref 8–23)
CO2: 25 mmol/L (ref 22–32)
Calcium: 10.1 mg/dL (ref 8.9–10.3)
Chloride: 105 mmol/L (ref 98–111)
Creatinine, Ser: 0.92 mg/dL (ref 0.44–1.00)
GFR calc Af Amer: >60 mL/min (ref >60)
GFR calc non Af Amer: >60 mL/min (ref >60)
Glucose, Bld: 352 mg/dL — ABNORMAL HIGH (ref 70–99)
Potassium: 4.3 mmol/L (ref 3.5–5.1)
Sodium: 139 mmol/L (ref 135–145)
Total Bilirubin: 0.3 mg/dL (ref 0.3–1.2)
Total Protein: 6.5 g/dL (ref 6.5–8.1)

## 2019-10-16 LAB — URINALYSIS, ROUTINE W REFLEX MICROSCOPIC
Bacteria, UA: NONE SEEN
Bilirubin Urine: NEGATIVE
Glucose, UA: >=500 mg/dL — AB
Hgb urine dipstick: NEGATIVE
Ketones, ur: NEGATIVE mg/dL
Leukocytes,Ua: NEGATIVE
Nitrite: NEGATIVE
Protein, ur: NEGATIVE mg/dL
Specific Gravity, Urine: 1.018 (ref 1.005–1.030)
pH: 6 (ref 5.0–8.0)

## 2019-10-16 LAB — PROTIME-INR
INR: 1 (ref 0.8–1.2)
Prothrombin Time: 13.3 seconds (ref 11.4–15.2)

## 2019-10-16 LAB — CBC
HCT: 42.8 % (ref 36.0–46.0)
Hemoglobin: 12.6 g/dL (ref 12.0–15.0)
MCH: 22.8 pg — ABNORMAL LOW (ref 26.0–34.0)
MCHC: 29.4 g/dL — ABNORMAL LOW (ref 30.0–36.0)
MCV: 77.4 fL — ABNORMAL LOW (ref 80.0–100.0)
Platelets: 270 10*3/uL (ref 150–400)
RBC: 5.53 MIL/uL — ABNORMAL HIGH (ref 3.87–5.11)
RDW: 15.3 % (ref 11.5–15.5)
WBC: 4.6 10*3/uL (ref 4.0–10.5)
nRBC: 0 % (ref 0.0–0.2)

## 2019-10-16 LAB — TYPE AND SCREEN
ABO/RH(D): A POS
Antibody Screen: NEGATIVE

## 2019-10-16 LAB — ABO/RH: ABO/RH(D): A POS

## 2019-10-16 LAB — APTT: aPTT: 26 seconds (ref 24–36)

## 2019-10-16 LAB — GLUCOSE, CAPILLARY: Glucose-Capillary: 351 mg/dL — ABNORMAL HIGH (ref 70–99)

## 2019-10-16 LAB — SURGICAL PCR SCREEN
MRSA, PCR: NEGATIVE
Staphylococcus aureus: NEGATIVE

## 2019-10-16 NOTE — Progress Notes (Signed)
PCP - [name] - manages diabetes Cardiologist - [place]    Chest x-ray - n/a EKG - 10/04/19 Stress Test - 2019 ECHO - 2017 Cardiac Cath - 2015   Fasting Blood Sugar - 210-400 Checks Blood Sugar __1___ times a day  Encouraged patient to monitor her diet and her blood sugars more closely. Patient verbalized understanding of the importance of managing her blood sugar more closely.  Diabetic Coordinator consult order placed to be released the day of surgery  Aspirin Instructions: continue  COVID TEST- 10/18/19   Anesthesia review: yes, spoke to [facility] about patients diabetes and her blood sugar today.   Patient denies shortness of breath, fever, cough and chest pain at PAT appointment   All instructions explained to the patient, with a verbal understanding of the material. Patient agrees to go over the instructions while at home for a better understanding. Patient also instructed to self quarantine after being tested for COVID-19. The opportunity to ask questions was provided.

## 2019-10-17 ENCOUNTER — Ambulatory Visit (INDEPENDENT_AMBULATORY_CARE_PROVIDER_SITE_OTHER): Payer: Medicare Other | Admitting: Internal Medicine

## 2019-10-17 DIAGNOSIS — E1149 Type 2 diabetes mellitus with other diabetic neurological complication: Secondary | ICD-10-CM

## 2019-10-17 DIAGNOSIS — E118 Type 2 diabetes mellitus with unspecified complications: Secondary | ICD-10-CM | POA: Diagnosis not present

## 2019-10-17 DIAGNOSIS — Z794 Long term (current) use of insulin: Secondary | ICD-10-CM | POA: Diagnosis not present

## 2019-10-17 DIAGNOSIS — IMO0002 Reserved for concepts with insufficient information to code with codable children: Secondary | ICD-10-CM

## 2019-10-17 DIAGNOSIS — E119 Type 2 diabetes mellitus without complications: Secondary | ICD-10-CM

## 2019-10-17 MED ORDER — NOVOLOG FLEXPEN 100 UNIT/ML ~~LOC~~ SOPN: 5.0000 [IU] | PEN_INJECTOR | Freq: Three times a day (TID) | SUBCUTANEOUS | 11 refills | Status: DC

## 2019-10-17 MED ORDER — VALUMARK PEN NEEDLES 31G X 8 MM MISC: 1.0000 | Freq: Every day | 3 refills | Status: DC

## 2019-10-17 NOTE — Progress Notes (Signed)
Internal Medicine Clinic Attending  Case discussed with Dr. Helberg  soon after the resident saw the patient.  We reviewed the resident's history and exam and pertinent patient test results.  I agree with the assessment, diagnosis, and plan of care documented in the resident's note.  

## 2019-10-17 NOTE — Progress Notes (Signed)
   CC: Hyperglycemia  This is a telephone encounter between [employer] and Susan Holmes on 10/17/2019 for hyperglycemia. The visit was conducted with the patient located at home and [facility] at [facility]. The patient's identity was confirmed using their DOB and current address. The patient has consented to being evaluated through a telephone encounter and understands the associated risks (an examination cannot be done and the patient may need to come in for an appointment) / benefits (allows the patient to remain at home, decreasing exposure to coronavirus). I personally spent 6 minutes on medical discussion.   HPI:  [name] is a 63 y.o. with PMH as below.   Please see A&P for assessment of the patient's acute and chronic medical conditions.   Past Medical History:  Diagnosis Date  . Anxiety   . Arthritis   . Chest pain 09/26/2018  . Congenital blindness    Left  eye  . Coronary artery disease    per cardiac cath 2015-- mild disease involving LAD and branches ,  LCFx and branches, and mild disease pRCA  . Depression   . Diabetes mellitus, type II ([place])    followed by pcp  . Glaucoma   . Glaucoma, both eyes   . Hepatitis B infection pt unsure   resolved  . History of kidney stones    right stone  . History of transient ischemic attack (TIA) 2017   per pt no residuals  . HTN (hypertension)    followed by pcp  . Hyperlipidemia   . Poor dental hygiene   . Stroke ([place])   . Tobacco abuse    .5 PPD smoker   Review of Systems:  Performed and all others negative.  Assessment & Plan:   See Encounters Tab for problem based charting.  Patient discussed with Dr. Angelia Mould

## 2019-10-17 NOTE — Assessment & Plan Note (Addendum)
Patient seen on 10/15 for evaluation of persistent hyperglycemia. At that point her blood glucose remained between 400-500. We increased her Lantus to 20 units daily and started her on NovoLog five units TID with meals. She was to continue her Trulicity. She states that she has not started the NovoLog. However, she has noticed her CBG's are decreasing. This morning her CBG was 214. She is not having as much polyuria and polydipsia. She continues to feel fatigued and tired. She is tolerating PO intake without difficulty.  We discussed picking up and starting the NovoLog five units with meals. She states that she will do this. She will follow-up if her blood sugar does not continue to decrease.  A/P: - Continue Lantus 20 units QD and Trulicity 1.5 mg Qweekly  - START Novolog 5 units TID WC

## 2019-10-17 NOTE — Progress Notes (Signed)
Anesthesia Chart Review: Follows with cardiology for hx of chest pain. She had nonobstructive coronary disease at catheterization in 2015. She had a Myoview in October 2019 that was negative for ischemia, her EF was 57%. She also has PVD and is scheduled to have LFPBG after unsuccessful attempt at LtSFA stenting 09/14/2019. She was seen by Kerin Ransom, PA-C 10/04/19 for preop clearance. Per note, "Pt is an acceptable risk for planned surgery from cardiac standpoint."  Pt has markedly uncontrolled IDDMII. This is being actively managed by [facility] Internal Medicine clinic, last seen 10/21 and her medications were adjusted. A1c on 10/14 was 10.2. Her CBG at preop was 351 mg/dl. These results were called to Dr. Oneida Alar' office.   Nuclear stress 10/02/18:  Nuclear stress EF: 57%.  There was no ST segment deviation noted during stress.  The study is normal.  The left ventricular ejection fraction is normal (55-65%).   Normal resting and stress perfusion. No ischemia or infarction EF 57% LV appears enlarged     Wynonia Musty [facility] Short Stay Center/Anesthesiology Phone [phone] 10/17/2019 2:30 PM

## 2019-10-17 NOTE — Anesthesia Preprocedure Evaluation (Addendum)
Anesthesia Evaluation  Patient identified by MRN, date of birth, ID band Patient awake    Reviewed: Allergy & Precautions, NPO status , Patient's Chart, lab work & pertinent test results  History of Anesthesia Complications Negative for: history of anesthetic complications  Airway Mallampati: II  TM Distance: >3 FB Neck ROM: Full    Dental  (+) Dental Advisory Given   Pulmonary Current Smoker and Patient abstained from smoking.,    Pulmonary exam normal        Cardiovascular hypertension, Pt. on medications and Pt. on home beta blockers + CAD and + Peripheral Vascular Disease  Normal cardiovascular exam   She was seen by Kerin Ransom, PA-C 10/04/19 for preop clearance. Per note, "Pt is an acceptable risk for planned surgery from cardiac standpoint."  Nuclear stress 10/02/18:  Nuclear stress EF: 57%. There was no ST segment deviation noted during stress. The study is normal. The left ventricular ejection fraction is normal (55-65%).     Neuro/Psych PSYCHIATRIC DISORDERS Anxiety Depression  Left eye blindness  CVA, No Residual Symptoms    GI/Hepatic negative GI ROS, (+) Hepatitis -, B  Endo/Other  diabetes, Poorly Controlled, Type 2, Insulin Dependent  Renal/GU Renal disease     Musculoskeletal  (+) Arthritis ,   Abdominal   Peds  Hematology negative hematology ROS (+)   Anesthesia Other Findings Glaucoma   Reproductive/Obstetrics                           Anesthesia Physical Anesthesia Plan  ASA: III  Anesthesia Plan: General   Post-op Pain Management:    Induction: Intravenous  PONV Risk Score and Plan: 3 and Treatment may vary due to age or medical condition, Ondansetron, Midazolam and Scopolamine patch - Pre-op  Airway Management Planned: Oral ETT  Additional Equipment: Arterial line  Intra-op Plan:   Post-operative Plan: Extubation in OR  Informed Consent: I have  reviewed the patients History and Physical, chart, labs and discussed the procedure including the risks, benefits and alternatives for the proposed anesthesia with the patient or authorized representative who has indicated his/her understanding and acceptance.     Dental advisory given  Plan Discussed with: CRNA and Anesthesiologist  Anesthesia Plan Comments:      Anesthesia Quick Evaluation

## 2019-10-18 ENCOUNTER — Other Ambulatory Visit (HOSPITAL_COMMUNITY)
Admission: RE | Admit: 2019-10-18 | Discharge: 2019-10-18 | Disposition: A | Discharge status: Home / Self Care | Payer: Medicare Other | Source: Ambulatory Visit | Attending: Vascular Surgery | Admitting: Vascular Surgery

## 2019-10-18 DIAGNOSIS — Z20828 Contact with and (suspected) exposure to other viral communicable diseases: Secondary | ICD-10-CM | POA: Diagnosis not present

## 2019-10-18 DIAGNOSIS — Z01812 Encounter for preprocedural laboratory examination: Secondary | ICD-10-CM | POA: Diagnosis not present

## 2019-10-19 ENCOUNTER — Telehealth: Payer: Self-pay | Admitting: *Deleted

## 2019-10-19 DIAGNOSIS — E1165 Type 2 diabetes mellitus with hyperglycemia: Secondary | ICD-10-CM

## 2019-10-19 LAB — NOVEL CORONAVIRUS, NAA (HOSP ORDER, SEND-OUT TO REF LAB; TAT 18-24 HRS): SARS-CoV-2, NAA: NOT DETECTED

## 2019-10-19 MED ORDER — INSULIN LISPRO (1 UNIT DIAL) 100 UNIT/ML (KWIKPEN): 5.0000 [IU] | PEN_INJECTOR | Freq: Three times a day (TID) | SUBCUTANEOUS | 11 refills | Status: DC

## 2019-10-19 NOTE — Telephone Encounter (Signed)
Humalog flex pens sent in. Discontinued prescription for Novolog.  Thanks,  Ina Homes, MD

## 2019-10-19 NOTE — Telephone Encounter (Addendum)
Call to [employer] for PA for Novolog Flex Pen. Novolog Flex Pen not covered by patient's insurance .  Insurance does cover Humalog, Humalog Flex Pen, Lyumjev Quick Pen and Lispro Pen.  Will forward message to Dr. Tarri Abernethy and Dr. Marva Panda for a change.  Sander Nephew, RN 10/19/2019 11:38 AM.

## 2019-10-22 ENCOUNTER — Inpatient Hospital Stay (HOSPITAL_COMMUNITY): Payer: Medicare Other | Admitting: Certified Registered"

## 2019-10-22 ENCOUNTER — Other Ambulatory Visit: Payer: Self-pay

## 2019-10-22 ENCOUNTER — Encounter (HOSPITAL_COMMUNITY): Payer: Self-pay

## 2019-10-22 ENCOUNTER — Inpatient Hospital Stay (HOSPITAL_COMMUNITY)
Admission: RE | Admit: 2019-10-22 | Discharge: 2019-10-23 | DRG: 254 | Disposition: A | Discharge status: Home / Self Care | Payer: Medicare Other | Attending: Vascular Surgery | Admitting: Vascular Surgery

## 2019-10-22 ENCOUNTER — Encounter (HOSPITAL_COMMUNITY)
Admission: RE | Disposition: A | Discharge status: Home / Self Care | Payer: Self-pay | Source: Home / Self Care | Attending: Vascular Surgery

## 2019-10-22 ENCOUNTER — Inpatient Hospital Stay (HOSPITAL_COMMUNITY): Payer: Medicare Other | Admitting: Vascular Surgery

## 2019-10-22 DIAGNOSIS — Z8249 Family history of ischemic heart disease and other diseases of the circulatory system: Secondary | ICD-10-CM | POA: Diagnosis not present

## 2019-10-22 DIAGNOSIS — I739 Peripheral vascular disease, unspecified: Secondary | ICD-10-CM | POA: Diagnosis not present

## 2019-10-22 DIAGNOSIS — E1151 Type 2 diabetes mellitus with diabetic peripheral angiopathy without gangrene: Principal | ICD-10-CM | POA: Diagnosis present

## 2019-10-22 DIAGNOSIS — I10 Essential (primary) hypertension: Secondary | ICD-10-CM | POA: Diagnosis not present

## 2019-10-22 DIAGNOSIS — Z7951 Long term (current) use of inhaled steroids: Secondary | ICD-10-CM

## 2019-10-22 DIAGNOSIS — Z90710 Acquired absence of both cervix and uterus: Secondary | ICD-10-CM | POA: Diagnosis not present

## 2019-10-22 DIAGNOSIS — Z794 Long term (current) use of insulin: Secondary | ICD-10-CM

## 2019-10-22 DIAGNOSIS — Z79899 Other long term (current) drug therapy: Secondary | ICD-10-CM | POA: Diagnosis not present

## 2019-10-22 DIAGNOSIS — H5462 Unqualified visual loss, left eye, normal vision right eye: Secondary | ICD-10-CM | POA: Diagnosis not present

## 2019-10-22 DIAGNOSIS — Z87442 Personal history of urinary calculi: Secondary | ICD-10-CM | POA: Diagnosis not present

## 2019-10-22 DIAGNOSIS — F17210 Nicotine dependence, cigarettes, uncomplicated: Secondary | ICD-10-CM | POA: Diagnosis present

## 2019-10-22 DIAGNOSIS — M1990 Unspecified osteoarthritis, unspecified site: Secondary | ICD-10-CM | POA: Diagnosis not present

## 2019-10-22 DIAGNOSIS — F17220 Nicotine dependence, chewing tobacco, uncomplicated: Secondary | ICD-10-CM | POA: Diagnosis present

## 2019-10-22 DIAGNOSIS — H409 Unspecified glaucoma: Secondary | ICD-10-CM | POA: Diagnosis not present

## 2019-10-22 DIAGNOSIS — F419 Anxiety disorder, unspecified: Secondary | ICD-10-CM | POA: Diagnosis present

## 2019-10-22 DIAGNOSIS — I2510 Atherosclerotic heart disease of native coronary artery without angina pectoris: Secondary | ICD-10-CM | POA: Diagnosis not present

## 2019-10-22 DIAGNOSIS — F329 Major depressive disorder, single episode, unspecified: Secondary | ICD-10-CM | POA: Diagnosis present

## 2019-10-22 DIAGNOSIS — Z888 Allergy status to other drugs, medicaments and biological substances status: Secondary | ICD-10-CM | POA: Diagnosis not present

## 2019-10-22 DIAGNOSIS — Z823 Family history of stroke: Secondary | ICD-10-CM | POA: Diagnosis not present

## 2019-10-22 DIAGNOSIS — Z8673 Personal history of transient ischemic attack (TIA), and cerebral infarction without residual deficits: Secondary | ICD-10-CM | POA: Diagnosis not present

## 2019-10-22 DIAGNOSIS — Z7982 Long term (current) use of aspirin: Secondary | ICD-10-CM | POA: Diagnosis not present

## 2019-10-22 DIAGNOSIS — Z8619 Personal history of other infectious and parasitic diseases: Secondary | ICD-10-CM | POA: Diagnosis not present

## 2019-10-22 DIAGNOSIS — E785 Hyperlipidemia, unspecified: Secondary | ICD-10-CM | POA: Diagnosis not present

## 2019-10-22 DIAGNOSIS — I70212 Atherosclerosis of native arteries of extremities with intermittent claudication, left leg: Secondary | ICD-10-CM | POA: Diagnosis not present

## 2019-10-22 DIAGNOSIS — E1165 Type 2 diabetes mellitus with hyperglycemia: Secondary | ICD-10-CM | POA: Diagnosis not present

## 2019-10-22 HISTORY — PX: BYPASS GRAFT: SHX909

## 2019-10-22 HISTORY — DX: Peripheral vascular disease, unspecified: I73.9

## 2019-10-22 HISTORY — PX: FEMORAL-POPLITEAL BYPASS GRAFT: SHX937

## 2019-10-22 LAB — CBC
HCT: 33.8 % — ABNORMAL LOW (ref 36.0–46.0)
Hemoglobin: 10.4 g/dL — ABNORMAL LOW (ref 12.0–15.0)
MCH: 23.3 pg — ABNORMAL LOW (ref 26.0–34.0)
MCHC: 30.8 g/dL (ref 30.0–36.0)
MCV: 75.8 fL — ABNORMAL LOW (ref 80.0–100.0)
Platelets: 200 10*3/uL (ref 150–400)
RBC: 4.46 MIL/uL (ref 3.87–5.11)
RDW: 15.1 % (ref 11.5–15.5)
WBC: 7.5 10*3/uL (ref 4.0–10.5)
nRBC: 0 % (ref 0.0–0.2)

## 2019-10-22 LAB — GLUCOSE, CAPILLARY
Glucose-Capillary: 141 mg/dL — ABNORMAL HIGH (ref 70–99)
Glucose-Capillary: 124 mg/dL — ABNORMAL HIGH (ref 70–99)
Glucose-Capillary: 123 mg/dL — ABNORMAL HIGH (ref 70–99)
Glucose-Capillary: 163 mg/dL — ABNORMAL HIGH (ref 70–99)

## 2019-10-22 LAB — CREATININE, SERUM
Creatinine, Ser: 0.72 mg/dL (ref 0.44–1.00)
GFR calc Af Amer: >60 mL/min (ref >60)
GFR calc non Af Amer: >60 mL/min (ref >60)

## 2019-10-22 SURGERY — BYPASS GRAFT FEMORAL-POPLITEAL ARTERY
Anesthesia: General | Site: Leg Upper | Laterality: Left

## 2019-10-22 MED ORDER — PANTOPRAZOLE SODIUM 40 MG PO TBEC
40.0000 mg | DELAYED_RELEASE_TABLET | Freq: Every day | ORAL | Status: DC
  Administered 2019-10-22 – 2019-10-23 (×2): 40 mg via ORAL
  Filled 2019-10-22 (×2): qty 1

## 2019-10-22 MED ORDER — DULOXETINE HCL 30 MG PO CPEP
30.0000 mg | ORAL_CAPSULE | Freq: Every day | ORAL | Status: DC
  Administered 2019-10-22 – 2019-10-23 (×2): 30 mg via ORAL
  Filled 2019-10-22 (×2): qty 1

## 2019-10-22 MED ORDER — ASPIRIN EC 81 MG PO TBEC
81.0000 mg | DELAYED_RELEASE_TABLET | Freq: Every day | ORAL | Status: DC
  Administered 2019-10-23: 81 mg via ORAL
  Filled 2019-10-22: qty 1

## 2019-10-22 MED ORDER — FENTANYL CITRATE (PF) 100 MCG/2ML IJ SOLN
25.0000 ug | INTRAMUSCULAR | Status: DC | PRN
  Administered 2019-10-22 (×2): 50 ug via INTRAVENOUS

## 2019-10-22 MED ORDER — ONDANSETRON HCL 4 MG/2ML IJ SOLN
INTRAMUSCULAR | Status: DC | PRN
  Administered 2019-10-22: 4 mg via INTRAVENOUS

## 2019-10-22 MED ORDER — OXYCODONE HCL 5 MG/5ML PO SOLN: 5.0000 mg | Freq: Once | ORAL | Status: DC | PRN

## 2019-10-22 MED ORDER — CYPROHEPTADINE HCL 4 MG PO TABS
4.0000 mg | ORAL_TABLET | Freq: Two times a day (BID) | ORAL | Status: DC
  Administered 2019-10-22 – 2019-10-23 (×2): 4 mg via ORAL
  Filled 2019-10-22 (×3): qty 1

## 2019-10-22 MED ORDER — METOPROLOL TARTRATE 5 MG/5ML IV SOLN: 2.0000 mg | INTRAVENOUS | Status: DC | PRN

## 2019-10-22 MED ORDER — OXYCODONE HCL 5 MG PO TABS: 5.0000 mg | ORAL_TABLET | Freq: Once | ORAL | Status: DC | PRN

## 2019-10-22 MED ORDER — TRAZODONE HCL 150 MG PO TABS
300.0000 mg | ORAL_TABLET | Freq: Every day | ORAL | Status: DC
  Administered 2019-10-22: 300 mg via ORAL
  Filled 2019-10-22: qty 2

## 2019-10-22 MED ORDER — SODIUM CHLORIDE 0.9 % IV SOLN
INTRAVENOUS | Status: DC | PRN
  Administered 2019-10-22: 30 ug/min via INTRAVENOUS

## 2019-10-22 MED ORDER — SUGAMMADEX SODIUM 200 MG/2ML IV SOLN
INTRAVENOUS | Status: DC | PRN
  Administered 2019-10-22: 150 mg via INTRAVENOUS

## 2019-10-22 MED ORDER — PROPOFOL 10 MG/ML IV BOLUS
INTRAVENOUS | Status: AC
  Filled 2019-10-22: qty 40

## 2019-10-22 MED ORDER — SODIUM CHLORIDE 0.9 % IV SOLN
INTRAVENOUS | Status: DC | PRN
  Administered 2019-10-22: 500 mL

## 2019-10-22 MED ORDER — MIDAZOLAM HCL 2 MG/2ML IJ SOLN
INTRAMUSCULAR | Status: AC
  Filled 2019-10-22: qty 2

## 2019-10-22 MED ORDER — CEFAZOLIN SODIUM-DEXTROSE 2-4 GM/100ML-% IV SOLN
2.0000 g | INTRAVENOUS | Status: AC
  Administered 2019-10-22: 2 g via INTRAVENOUS
  Filled 2019-10-22: qty 100

## 2019-10-22 MED ORDER — DIPHENHYDRAMINE HCL 25 MG PO CAPS
25.0000 mg | ORAL_CAPSULE | Freq: Four times a day (QID) | ORAL | Status: DC | PRN
  Administered 2019-10-22 – 2019-10-23 (×2): 25 mg via ORAL
  Filled 2019-10-22 (×2): qty 1

## 2019-10-22 MED ORDER — HYDRALAZINE HCL 20 MG/ML IJ SOLN
5.0000 mg | INTRAMUSCULAR | Status: AC | PRN
  Administered 2019-10-22 (×2): 5 mg via INTRAVENOUS

## 2019-10-22 MED ORDER — AMLODIPINE BESYLATE 5 MG PO TABS
5.0000 mg | ORAL_TABLET | Freq: Every day | ORAL | Status: DC
  Administered 2019-10-22 – 2019-10-23 (×2): 5 mg via ORAL
  Filled 2019-10-22 (×2): qty 1

## 2019-10-22 MED ORDER — NICOTINE 21 MG/24HR TD PT24
21.0000 mg | MEDICATED_PATCH | Freq: Every day | TRANSDERMAL | Status: DC | PRN
  Administered 2019-10-22 – 2019-10-23 (×2): 21 mg via TRANSDERMAL
  Filled 2019-10-22 (×2): qty 1

## 2019-10-22 MED ORDER — CHLORHEXIDINE GLUCONATE 4 % EX LIQD: 60.0000 mL | Freq: Once | CUTANEOUS | Status: DC

## 2019-10-22 MED ORDER — GUAIFENESIN-DM 100-10 MG/5ML PO SYRP: 15.0000 mL | ORAL_SOLUTION | ORAL | Status: DC | PRN

## 2019-10-22 MED ORDER — MIRTAZAPINE 15 MG PO TABS
45.0000 mg | ORAL_TABLET | Freq: Every day | ORAL | Status: DC
  Administered 2019-10-22 (×2): 45 mg via ORAL
  Filled 2019-10-22: qty 3

## 2019-10-22 MED ORDER — HEPARIN SODIUM (PORCINE) 1000 UNIT/ML IJ SOLN
INTRAMUSCULAR | Status: DC | PRN
  Administered 2019-10-22: 8000 [IU] via INTRAVENOUS

## 2019-10-22 MED ORDER — LABETALOL HCL 5 MG/ML IV SOLN
INTRAVENOUS | Status: AC
  Filled 2019-10-22: qty 4

## 2019-10-22 MED ORDER — PHENOL 1.4 % MT LIQD: 1.0000 | OROMUCOSAL | Status: DC | PRN

## 2019-10-22 MED ORDER — ACETAMINOPHEN 325 MG RE SUPP: 325.0000 mg | RECTAL | Status: DC | PRN

## 2019-10-22 MED ORDER — ROCURONIUM BROMIDE 50 MG/5ML IV SOSY
PREFILLED_SYRINGE | INTRAVENOUS | Status: DC | PRN
  Administered 2019-10-22 (×2): 30 mg via INTRAVENOUS
  Administered 2019-10-22: 80 mg via INTRAVENOUS

## 2019-10-22 MED ORDER — FENTANYL CITRATE (PF) 100 MCG/2ML IJ SOLN
INTRAMUSCULAR | Status: AC
  Filled 2019-10-22: qty 2

## 2019-10-22 MED ORDER — PROMETHAZINE HCL 25 MG/ML IJ SOLN
12.5000 mg | Freq: Once | INTRAMUSCULAR | Status: AC
  Administered 2019-10-22: 12.5 mg via INTRAVENOUS
  Filled 2019-10-22: qty 1

## 2019-10-22 MED ORDER — SODIUM CHLORIDE 0.9 % IV SOLN: 500.0000 mL | Freq: Once | INTRAVENOUS | Status: DC | PRN

## 2019-10-22 MED ORDER — FENTANYL CITRATE (PF) 100 MCG/2ML IJ SOLN
INTRAMUSCULAR | Status: DC | PRN
  Administered 2019-10-22 (×4): 25 ug via INTRAVENOUS
  Administered 2019-10-22: 50 ug via INTRAVENOUS
  Administered 2019-10-22: 100 ug via INTRAVENOUS

## 2019-10-22 MED ORDER — DOCUSATE SODIUM 100 MG PO CAPS
100.0000 mg | ORAL_CAPSULE | Freq: Every day | ORAL | Status: DC
  Administered 2019-10-23: 100 mg via ORAL
  Filled 2019-10-22: qty 1

## 2019-10-22 MED ORDER — LABETALOL HCL 5 MG/ML IV SOLN
10.0000 mg | INTRAVENOUS | Status: AC | PRN
  Administered 2019-10-22 (×4): 10 mg via INTRAVENOUS

## 2019-10-22 MED ORDER — LOSARTAN POTASSIUM 50 MG PO TABS: 100.0000 mg | ORAL_TABLET | Freq: Every day | ORAL | Status: DC

## 2019-10-22 MED ORDER — HYDRALAZINE HCL 20 MG/ML IJ SOLN
INTRAMUSCULAR | Status: AC
  Filled 2019-10-22: qty 1

## 2019-10-22 MED ORDER — LACTATED RINGERS IV SOLN
INTRAVENOUS | Status: DC | PRN
  Administered 2019-10-22 (×3): via INTRAVENOUS

## 2019-10-22 MED ORDER — ALUM & MAG HYDROXIDE-SIMETH 200-200-20 MG/5ML PO SUSP: 15.0000 mL | ORAL | Status: DC | PRN

## 2019-10-22 MED ORDER — CHLORTHALIDONE 25 MG PO TABS
12.5000 mg | ORAL_TABLET | Freq: Every day | ORAL | Status: DC
  Administered 2019-10-22 – 2019-10-23 (×2): 12.5 mg via ORAL
  Filled 2019-10-22 (×2): qty 0.5

## 2019-10-22 MED ORDER — MORPHINE SULFATE (PF) 2 MG/ML IV SOLN
2.0000 mg | INTRAVENOUS | Status: DC | PRN
  Administered 2019-10-22: 2 mg via INTRAVENOUS
  Filled 2019-10-22: qty 1

## 2019-10-22 MED ORDER — ONDANSETRON HCL 4 MG/2ML IJ SOLN: 4.0000 mg | Freq: Once | INTRAMUSCULAR | Status: DC | PRN

## 2019-10-22 MED ORDER — HYDROXYZINE HCL 25 MG PO TABS
25.0000 mg | ORAL_TABLET | Freq: Three times a day (TID) | ORAL | Status: DC | PRN
  Administered 2019-10-22: 25 mg via ORAL
  Filled 2019-10-22: qty 1

## 2019-10-22 MED ORDER — SODIUM CHLORIDE 0.9 % IV SOLN
INTRAVENOUS | Status: AC
  Filled 2019-10-22: qty 1.2

## 2019-10-22 MED ORDER — PROPOFOL 10 MG/ML IV BOLUS
INTRAVENOUS | Status: DC | PRN
  Administered 2019-10-22: 120 mg via INTRAVENOUS

## 2019-10-22 MED ORDER — POTASSIUM CHLORIDE CRYS ER 20 MEQ PO TBCR: 20.0000 meq | EXTENDED_RELEASE_TABLET | Freq: Every day | ORAL | Status: DC | PRN

## 2019-10-22 MED ORDER — ROSUVASTATIN CALCIUM 20 MG PO TABS
20.0000 mg | ORAL_TABLET | Freq: Every day | ORAL | Status: DC
  Administered 2019-10-22 – 2019-10-23 (×2): 20 mg via ORAL
  Filled 2019-10-22 (×2): qty 1

## 2019-10-22 MED ORDER — POLYETHYLENE GLYCOL 3350 17 G PO PACK: 17.0000 g | PACK | Freq: Every day | ORAL | Status: DC | PRN

## 2019-10-22 MED ORDER — EPHEDRINE SULFATE-NACL 50-0.9 MG/10ML-% IV SOSY
PREFILLED_SYRINGE | INTRAVENOUS | Status: DC | PRN
  Administered 2019-10-22 (×3): 5 mg via INTRAVENOUS

## 2019-10-22 MED ORDER — ESMOLOL HCL 100 MG/10ML IV SOLN
INTRAVENOUS | Status: DC | PRN
  Administered 2019-10-22: 30 mg via INTRAVENOUS
  Administered 2019-10-22: 20 mg via INTRAVENOUS

## 2019-10-22 MED ORDER — SODIUM CHLORIDE 0.9 % IV SOLN: INTRAVENOUS | Status: DC | PRN

## 2019-10-22 MED ORDER — CEFAZOLIN SODIUM-DEXTROSE 2-4 GM/100ML-% IV SOLN
2.0000 g | Freq: Three times a day (TID) | INTRAVENOUS | Status: AC
  Administered 2019-10-22 (×2): 2 g via INTRAVENOUS
  Filled 2019-10-22 (×2): qty 100

## 2019-10-22 MED ORDER — ONDANSETRON HCL 4 MG/2ML IJ SOLN
4.0000 mg | Freq: Four times a day (QID) | INTRAMUSCULAR | Status: DC | PRN
  Administered 2019-10-22: 4 mg via INTRAVENOUS
  Filled 2019-10-22: qty 2

## 2019-10-22 MED ORDER — MOMETASONE FURO-FORMOTEROL FUM 200-5 MCG/ACT IN AERO
2.0000 | INHALATION_SPRAY | Freq: Two times a day (BID) | RESPIRATORY_TRACT | Status: DC
  Administered 2019-10-22: 2 via RESPIRATORY_TRACT
  Filled 2019-10-22: qty 8.8

## 2019-10-22 MED ORDER — OXYCODONE-ACETAMINOPHEN 5-325 MG PO TABS
1.0000 | ORAL_TABLET | ORAL | Status: DC | PRN
  Administered 2019-10-22 – 2019-10-23 (×4): 2 via ORAL
  Filled 2019-10-22 (×4): qty 2

## 2019-10-22 MED ORDER — CARVEDILOL 12.5 MG PO TABS
12.5000 mg | ORAL_TABLET | Freq: Two times a day (BID) | ORAL | Status: DC
  Administered 2019-10-22 – 2019-10-23 (×2): 12.5 mg via ORAL
  Filled 2019-10-22 (×3): qty 1

## 2019-10-22 MED ORDER — 0.9 % SODIUM CHLORIDE (POUR BTL) OPTIME
TOPICAL | Status: DC | PRN
  Administered 2019-10-22: 2000 mL

## 2019-10-22 MED ORDER — CARVEDILOL 12.5 MG PO TABS
12.5000 mg | ORAL_TABLET | Freq: Once | ORAL | Status: AC
  Administered 2019-10-22: 07:00:00 12.5 mg via ORAL
  Filled 2019-10-22: qty 1

## 2019-10-22 MED ORDER — INSULIN GLARGINE 100 UNIT/ML ~~LOC~~ SOLN
28.0000 [IU] | Freq: Every day | SUBCUTANEOUS | Status: DC
  Administered 2019-10-22: 28 [IU] via SUBCUTANEOUS
  Filled 2019-10-22 (×2): qty 0.28

## 2019-10-22 MED ORDER — ROCURONIUM BROMIDE 10 MG/ML (PF) SYRINGE
PREFILLED_SYRINGE | INTRAVENOUS | Status: AC
  Filled 2019-10-22: qty 10

## 2019-10-22 MED ORDER — ACETAMINOPHEN 325 MG PO TABS: 325.0000 mg | ORAL_TABLET | ORAL | Status: DC | PRN

## 2019-10-22 MED ORDER — SODIUM CHLORIDE 0.9 % IV SOLN: INTRAVENOUS | Status: DC

## 2019-10-22 MED ORDER — MAGNESIUM SULFATE 2 GM/50ML IV SOLN: 2.0000 g | Freq: Every day | INTRAVENOUS | Status: DC | PRN

## 2019-10-22 MED ORDER — FENTANYL CITRATE (PF) 250 MCG/5ML IJ SOLN
INTRAMUSCULAR | Status: AC
  Filled 2019-10-22: qty 5

## 2019-10-22 MED ORDER — LIDOCAINE 2% (20 MG/ML) 5 ML SYRINGE
INTRAMUSCULAR | Status: AC
  Filled 2019-10-22: qty 5

## 2019-10-22 MED ORDER — HEPARIN SODIUM (PORCINE) 1000 UNIT/ML IJ SOLN
INTRAMUSCULAR | Status: AC
  Filled 2019-10-22: qty 1

## 2019-10-22 MED ORDER — PROTAMINE SULFATE 10 MG/ML IV SOLN
INTRAVENOUS | Status: DC | PRN
  Administered 2019-10-22 (×2): 20 mg via INTRAVENOUS
  Administered 2019-10-22: 10 mg via INTRAVENOUS

## 2019-10-22 MED ORDER — PHENYLEPHRINE 40 MCG/ML (10ML) SYRINGE FOR IV PUSH (FOR BLOOD PRESSURE SUPPORT)
PREFILLED_SYRINGE | INTRAVENOUS | Status: DC | PRN
  Administered 2019-10-22 (×3): 80 ug via INTRAVENOUS

## 2019-10-22 MED ORDER — HEPARIN SODIUM (PORCINE) 5000 UNIT/ML IJ SOLN
5000.0000 [IU] | Freq: Three times a day (TID) | INTRAMUSCULAR | Status: DC
  Administered 2019-10-23: 5000 [IU] via SUBCUTANEOUS
  Filled 2019-10-22: qty 1

## 2019-10-22 MED ORDER — INSULIN ASPART 100 UNIT/ML ~~LOC~~ SOLN
0.0000 [IU] | Freq: Three times a day (TID) | SUBCUTANEOUS | Status: DC
  Administered 2019-10-23 (×2): 2 [IU] via SUBCUTANEOUS

## 2019-10-22 MED ORDER — LIDOCAINE 2% (20 MG/ML) 5 ML SYRINGE
INTRAMUSCULAR | Status: DC | PRN
  Administered 2019-10-22: 60 mg via INTRAVENOUS

## 2019-10-22 SURGICAL SUPPLY — 57 items
BANDAGE ESMARK 6X9 LF (GAUZE/BANDAGES/DRESSINGS) IMPLANT
BNDG ESMARK 6X9 LF (GAUZE/BANDAGES/DRESSINGS)
CANISTER SUCT 3000ML PPV (MISCELLANEOUS) ×2 IMPLANT
CANNULA VESSEL 3MM 2 BLNT TIP (CANNULA) ×6 IMPLANT
CLIP VESOCCLUDE MED 24/CT (CLIP) ×2 IMPLANT
CLIP VESOCCLUDE SM WIDE 24/CT (CLIP) ×2 IMPLANT
COVER WAND RF STERILE (DRAPES) ×2 IMPLANT
CUFF TOURN SGL QUICK 24 (TOURNIQUET CUFF)
CUFF TOURN SGL QUICK 34 (TOURNIQUET CUFF)
CUFF TOURN SGL QUICK 42 (TOURNIQUET CUFF) IMPLANT
CUFF TRNQT CYL 24X4X16.5-23 (TOURNIQUET CUFF) IMPLANT
CUFF TRNQT CYL 34X4.125X (TOURNIQUET CUFF) IMPLANT
DERMABOND ADVANCED (GAUZE/BANDAGES/DRESSINGS) ×2
DERMABOND ADVANCED .7 DNX12 (GAUZE/BANDAGES/DRESSINGS) ×2 IMPLANT
DRAIN HEMOVAC 1/8 X 5 (WOUND CARE) IMPLANT
DRAPE HALF SHEET 40X57 (DRAPES) IMPLANT
DRAPE X-RAY CASS 24X20 (DRAPES) IMPLANT
ELECT REM PT RETURN 9FT ADLT (ELECTROSURGICAL) ×2
ELECTRODE REM PT RTRN 9FT ADLT (ELECTROSURGICAL) ×1 IMPLANT
EVACUATOR SILICONE 100CC (DRAIN) IMPLANT
GLOVE BIO SURGEON STRL SZ7.5 (GLOVE) ×4 IMPLANT
GLOVE BIOGEL PI IND STRL 8 (GLOVE) ×2 IMPLANT
GLOVE BIOGEL PI INDICATOR 8 (GLOVE) ×2
GLOVE ECLIPSE 8.0 STRL XLNG CF (GLOVE) ×4 IMPLANT
GLOVE SS BIOGEL STRL SZ 6.5 (GLOVE) ×1 IMPLANT
GLOVE SUPERSENSE BIOGEL SZ 6.5 (GLOVE) ×1
GOWN STRL REUS W/ TWL LRG LVL3 (GOWN DISPOSABLE) ×2 IMPLANT
GOWN STRL REUS W/ TWL XL LVL3 (GOWN DISPOSABLE) ×1 IMPLANT
GOWN STRL REUS W/TWL 2XL LVL3 (GOWN DISPOSABLE) ×4 IMPLANT
GOWN STRL REUS W/TWL LRG LVL3 (GOWN DISPOSABLE) ×2
GOWN STRL REUS W/TWL XL LVL3 (GOWN DISPOSABLE) ×1
HEMOSTAT SPONGE AVITENE ULTRA (HEMOSTASIS) IMPLANT
KIT BASIN OR (CUSTOM PROCEDURE TRAY) ×2 IMPLANT
KIT TURNOVER KIT B (KITS) ×2 IMPLANT
NS IRRIG 1000ML POUR BTL (IV SOLUTION) ×4 IMPLANT
PACK PERIPHERAL VASCULAR (CUSTOM PROCEDURE TRAY) ×2 IMPLANT
PAD ARMBOARD 7.5X6 YLW CONV (MISCELLANEOUS) ×4 IMPLANT
SET COLLECT BLD 21X3/4 12 (NEEDLE) IMPLANT
STOPCOCK 4 WAY LG BORE MALE ST (IV SETS) IMPLANT
SUT PROLENE 5 0 C 1 24 (SUTURE) ×2 IMPLANT
SUT PROLENE 6 0 CC (SUTURE) ×6 IMPLANT
SUT PROLENE 7 0 BV 1 (SUTURE) ×8 IMPLANT
SUT PROLENE 7 0 BV1 MDA (SUTURE) IMPLANT
SUT SILK 2 0 SH (SUTURE) ×2 IMPLANT
SUT SILK 3 0 (SUTURE) ×2
SUT SILK 3-0 18XBRD TIE 12 (SUTURE) ×2 IMPLANT
SUT VIC AB 2-0 SH 27 (SUTURE) ×3
SUT VIC AB 2-0 SH 27XBRD (SUTURE) ×3 IMPLANT
SUT VIC AB 3-0 SH 27 (SUTURE) ×6
SUT VIC AB 3-0 SH 27X BRD (SUTURE) ×6 IMPLANT
SUT VIC AB 4-0 PS2 27 (SUTURE) ×8 IMPLANT
TAPE UMBILICAL COTTON 1/8X30 (MISCELLANEOUS) ×2 IMPLANT
TOWEL GREEN STERILE (TOWEL DISPOSABLE) ×2 IMPLANT
TRAY FOLEY MTR SLVR 16FR STAT (SET/KITS/TRAYS/PACK) ×2 IMPLANT
TUBING EXTENTION W/L.L. (IV SETS) IMPLANT
UNDERPAD 30X30 (UNDERPADS AND DIAPERS) ×2 IMPLANT
WATER STERILE IRR 1000ML POUR (IV SOLUTION) ×2 IMPLANT

## 2019-10-22 NOTE — Progress Notes (Signed)
Pt's BP has been elevated in the PACU, even after exhausting all PRN options. Brock, anesthesiologist, notified about ART line and cuff BP and discrepancy. Brock informed RN to follow cuff reading and reading for last BP (138/88) is acceptable.

## 2019-10-22 NOTE — Anesthesia Procedure Notes (Signed)
Arterial Line Insertion Start/End10/26/2020 7:45 AM, 10/22/2019 7:50 AM Performed by: Audry Pili, MD, Griffin Dakin, CRNA, CRNA  Patient location: Pre-op. Preanesthetic checklist: patient identified, IV checked, site marked, risks and benefits discussed, surgical consent, monitors and equipment checked, pre-op evaluation, timeout performed and anesthesia consent Patient sedated radial was placed Catheter size: 20 Fr Hand hygiene performed  and maximum sterile barriers used   Attempts: 1 Procedure performed without using ultrasound guided technique. Following insertion, dressing applied. Post procedure assessment: normal and unchanged  Patient tolerated the procedure well with no immediate complications.

## 2019-10-22 NOTE — Anesthesia Procedure Notes (Signed)
Procedure Name: Intubation Date/Time: 10/22/2019 7:38 AM Performed by: Griffin Dakin, CRNA Pre-anesthesia Checklist: Patient identified, Emergency Drugs available, Suction available and Patient being monitored Patient Re-evaluated:Patient Re-evaluated prior to induction Oxygen Delivery Method: Circle system utilized Preoxygenation: Pre-oxygenation with 100% oxygen Induction Type: IV induction Ventilation: Mask ventilation without difficulty Laryngoscope Size: Mac and 3 Grade View: Grade I Tube type: Oral Tube size: 7.0 mm Number of attempts: 1 Airway Equipment and Method: Stylet and Oral airway Placement Confirmation: ETT inserted through vocal cords under direct vision,  positive ETCO2 and breath sounds checked- equal and bilateral Secured at: 21 cm Tube secured with: Tape Dental Injury: Teeth and Oropharynx as per pre-operative assessment

## 2019-10-22 NOTE — H&P (Signed)
Susan Holmes is a 63 y.o. female, a several year history of slowly progressive worsening of left leg pain when she ambulates.  She currently is only able to walk about 1 block before experiencing pain in her left leg.  She does not have rest pain.  She has no nonhealing wounds.  She also does have a history of diabetes.  She currently smokes about half a pack of cigarettes per day.  Greater than 3 minutes today spent regarding smoking cessation counseling.  I also discussed with her today the combination of diabetes and smoking increases risk of limb loss 100 fold.  Other medical problems include coronary artery disease, glaucoma, hypertension, hyperlipidemia all of which have been stable.  She is on aspirin and statin.      Past Medical History:  Diagnosis Date  . Anxiety   . Arthritis   . Chest pain 09/26/2018  . Congenital blindness    Left  eye  . Coronary artery disease    per cardiac cath 2015-- mild disease involving LAD and branches ,  LCFx and branches, and mild disease pRCA  . Depression   . Diabetes mellitus, type II ([place])    followed by pcp  . Glaucoma   . Glaucoma, both eyes   . Hepatitis B infection pt unsure   resolved  . History of transient ischemic attack (TIA) 2017   per pt no residuals  . HTN (hypertension)    followed by pcp  . Hyperlipidemia   . Poor dental hygiene   . Renal calculus, right   . Stroke ([place])   . Tobacco abuse    .5 PPD smoker        Past Surgical History:  Procedure Laterality Date  . ABDOMINAL HYSTERECTOMY  1980s   unsure if ovaries were taken  . GLAUCOMA SURGERY Right 2017  . IR URETERAL STENT RIGHT NEW ACCESS W/O SEP NEPHROSTOMY CATH  10/03/2018  . LEFT HEART CATHETERIZATION WITH CORONARY ANGIOGRAM N/A 10/09/2014   Procedure: LEFT HEART CATHETERIZATION WITH CORONARY ANGIOGRAM;  Surgeon: Troy Sine, MD;  Location: [facility] CATH LAB;  Service: Cardiovascular;  Laterality: N/A;  . NEPHROLITHOTOMY Right 10/03/2018    Procedure: RIGHT NEPHROLITHOTOMY PERCUTANEOUS FIRST STAGE;  Surgeon: Irine Seal, MD;  Location: [facility] ORS;  Service: Urology;  Laterality: Right;  . TEE WITHOUT CARDIOVERSION N/A 08/17/2016   Procedure: TRANSESOPHAGEAL ECHOCARDIOGRAM (TEE);  Surgeon: Sueanne Margarita, MD;  Location: [facility] ENDOSCOPY;  Service: Cardiovascular;  Laterality: N/A;         Family History  Problem Relation Age of Onset  . Heart disease Mother 62  . Hypertension Mother   . CVA Mother   . Heart disease Father 3  . CVA Sister   . CVA Brother   . Aneurysm Brother   . Aneurysm Sister   . CVA Maternal Grandmother   . CVA Maternal Grandfather   . Colon cancer Neg Hx   . Esophageal cancer Neg Hx   . Stomach cancer Neg Hx     SOCIAL HISTORY: Social History        Socioeconomic History  . Marital status: Divorced    Spouse name: Not on file  . Number of children: 1  . Years of education: 50  . Highest education level: Not on file  Occupational History    Employer: UNEMPLOYED  Social Needs  . Financial resource strain: Not on file  . Food insecurity    Worry: Not on file    Inability: Not  on file  . Transportation needs    Medical: Not on file    Non-medical: Not on file  Tobacco Use  . Smoking status: Current Every Day Smoker    Packs/day: 0.50    Years: 39.00    Pack years: 19.50    Types: Cigarettes    Start date: 40  . Smokeless tobacco: Current User    Types: Chew  . Tobacco comment: < half pack per day  Substance and Sexual Activity  . Alcohol use: No    Alcohol/week: 0.0 standard drinks  . Drug use: No  . Sexual activity: Not Currently    Birth control/protection: Surgical  Lifestyle  . Physical activity    Days per week: Not on file    Minutes per session: Not on file  . Stress: Not on file  Relationships  . Social Herbalist on phone: Not on file    Gets together: Not on file    Attends religious service: Not on  file    Active member of club or organization: Not on file    Attends meetings of clubs or organizations: Not on file    Relationship status: Not on file  . Intimate partner violence    Fear of current or ex partner: Not on file    Emotionally abused: Not on file    Physically abused: Not on file    Forced sexual activity: Not on file  Other Topics Concern  . Not on file  Social History Narrative  . Not on file         Allergies  Allergen Reactions  . Ace Inhibitors Cough  . Chantix [Varenicline] Nausea And Vomiting  . Lipitor [Atorvastatin] Other (See Comments)    MYALGIA           Current Outpatient Medications  Medication Sig Dispense Refill  . ACCU-CHEK FASTCLIX LANCETS MISC CHECK BLOOD SUGAR THREE TIMES DAILY AS DIRECTED 306 each 5  . amLODipine (NORVASC) 10 MG tablet Take 0.5 tablets (5 mg total) by mouth every morning. 45 tablet 2  . aspirin EC 81 MG tablet Take 81 mg by mouth daily.    . Blood Glucose Monitoring Suppl (ACCU-CHEK GUIDE) w/Device KIT 1 each by Does not apply route 3 (three) times daily. 1 kit 0  . budesonide-formoterol (SYMBICORT) 160-4.5 MCG/ACT inhaler Inhale 2 puffs into the lungs 2 (two) times daily as needed. 2 Inhaler 0  . carvedilol (COREG) 12.5 MG tablet TAKE 1 TABLET(12.5 MG) BY MOUTH TWICE DAILY 60 tablet 6  . docusate sodium (COLACE) 100 MG capsule Take 1 capsule (100 mg total) by mouth 2 (two) times daily. 10 capsule 0  . dorzolamide-timolol (COSOPT) 22.3-6.8 MG/ML ophthalmic solution Place 1 drop into both eyes 2 (two) times daily.     . Dulaglutide (TRULICITY) 1.5 OZ/3.6UY SOPN Inject 1.5 mg into the skin once a week. 4 pen 2  . DULoxetine (CYMBALTA) 30 MG capsule     . ibuprofen (ADVIL) 800 MG tablet TK 1 T PO QID PRN    . Insulin Glargine (LANTUS SOLOSTAR) 100 UNIT/ML Solostar Pen Inject 24 Units into the skin at bedtime. 5 pen 5  . Insulin Pen Needle (VALUMARK PEN NEEDLES) 31G X 8 MM MISC 1 each by Does not  apply route daily. 100 each 3  . Loperamide HCl 1 MG/7.5ML LIQD Take 30 mLs (4 mg total) by mouth 3 (three) times daily as needed. 150 mL 0  . mirtazapine (REMERON) 45 MG  tablet Take 45 mg by mouth at bedtime.    . Multiple Vitamins-Minerals (CENTRUM SILVER PO) Take 1 tablet by mouth daily.    Glory Rosebush ULTRA test strip USE TO TEST BLOOD GLUCOSE THREE TIMES DAILY 100 strip 12  . rosuvastatin (CRESTOR) 10 MG tablet Take 2 tablets (20 mg total) by mouth daily. 90 tablet 1  . traZODone (DESYREL) 100 MG tablet Take 300 mg by mouth at bedtime.     Marland Kitchen losartan (COZAAR) 100 MG tablet Take 1 tablet (100 mg total) by mouth daily. (Patient taking differently: Take 100 mg by mouth every morning. ) 90 tablet 3   No current facility-administered medications for this visit.     ROS:   General:  No weight loss, Fever, chills  HEENT: No recent headaches, no nasal bleeding, no visual changes, no sore throat  Neurologic: No dizziness, blackouts, seizures. No recent symptoms of stroke or mini- stroke. No recent episodes of slurred speech, or temporary blindness.  Cardiac: No recent episodes of chest pain/pressure, no shortness of breath at rest.  No shortness of breath with exertion.  Denies history of atrial fibrillation or irregular heartbeat  Vascular: No history of rest pain in feet.  + history of claudication.  No history of non-healing ulcer, No history of DVT   Pulmonary: No home oxygen, no productive cough, no hemoptysis,  No asthma or wheezing  Musculoskeletal:  '[X]'  Arthritis, '[ ]'  Low back pain,  '[X]'  Joint pain  Hematologic:No history of hypercoagulable state.  No history of easy bleeding.  No history of anemia  Gastrointestinal: No hematochezia or melena,  No gastroesophageal reflux, no trouble swallowing  Urinary: '[ ]'  chronic Kidney disease, '[ ]'  on HD - '[ ]'  MWF or '[ ]'  TTHS, '[ ]'  Burning with urination, '[ ]'  Frequent urination, '[ ]'  Difficulty urinating;   Skin: No  rashes  Psychological: No history of anxiety,  No history of depression   Physical Examination   Vitals:   10/22/19 0604  BP: (!) 143/77  Pulse: 71  Resp: 20  Temp: 98.2 F (36.8 C)  TempSrc: Oral  SpO2: 98%  Weight: 71.7 kg  Height: '5\' 7"'  (1.702 m)   Body mass index is 25.11 kg/m.  General:  Alert and oriented, no acute distress HEENT: Normal Neck: No JVD Cardiac: Regular Rate and Rhythm  Abdomen: Soft, non-tender, non-distended, no mass Skin: No rash Extremity Pulses:  2+ radial, brachial, femoral, 2+ right absent left dorsalis pedis, absent posterior tibial pulses bilaterally Musculoskeletal: No deformity or edema      Neurologic: Upper and lower extremity motor 5/5 and symmetric  DATA:  Patient had bilateral ABIs performed today which were 1 and triphasic on the right 0.7 on the left she also had an serial duplex performed which showed short segment left SFA occlusion.  I reviewed and interpreted the studies.  ASSESSMENT: Left leg claudication.  I discussed with the patient today the possibility of a 30-minute walking program smoking cessation conservative management of her problem.  However, she states that she cannot possibly walk this amount of time and that she feels like her lifestyle is inhibited by her current situation.  She is going to make a concerted effort to try to quit smoking.  I discussed with her that her life time risk of limb loss should be about 5% or less if she is able to quit smoking.  I also discussed with her that she currently is not at risk of limb loss and that  an intervention would purely be for lifestyle not for limb salvage.  She understands and agrees to proceed.  Other risks including but not limited to bleeding infection contrast reaction explained the patient today.   PLAN: left fem pop bypass today Will need nicotine patch post op  Ruta Hinds, MD [facility] [place]: [phone] Pager:  [phone]

## 2019-10-22 NOTE — Anesthesia Postprocedure Evaluation (Signed)
Anesthesia Post Note  Patient: Susan Holmes  Procedure(s) Performed: LEFT FEMORAL-ABOVE KNEE POPLITEAL  BYPASS GRAFT with REVERSED GREATER SAPHENOUS VEIN (Left Leg Upper)     Patient location during evaluation: PACU Anesthesia Type: General Level of consciousness: awake and alert Pain management: pain level controlled Vital Signs Assessment: post-procedure vital signs reviewed and stable Respiratory status: spontaneous breathing, nonlabored ventilation and respiratory function stable Cardiovascular status: blood pressure returned to baseline and stable Postop Assessment: no apparent nausea or vomiting Anesthetic complications: no    Last Vitals:  Vitals:   10/22/19 1243 10/22/19 1245  BP: (!) 182/83 (!) 155/109  Pulse:  (!) 59  Resp:  14  Temp:    SpO2:  100%    Last Pain:  Vitals:   10/22/19 1220  TempSrc:   PainSc: Fisher Brock

## 2019-10-22 NOTE — Progress Notes (Signed)
PT awake and alert Left foot 1+ DP pulse in PACU No hematoma to Joseph, MD [facility] [place] Office: [phone]

## 2019-10-22 NOTE — Op Note (Signed)
Procedure: Left femoral to above-knee popliteal bypass using reversed ipsilateral greater saphenous vein  Preoperative diagnosis: Claudication left leg  Postoperative diagnosis: Same  Anesthesia: General  Assistant: Arlee Muslim, PA-C  Operative findings: 3 mm fairly uniform diameter left greater saphenous vein  Palpable dorsalis pedis pulse at conclusion of case  Operative details: After obtaining form consent, the patient taken operating.  The patient placed supine position operating table.  After induction general anesthesia and endotracheal ovation Foley cath was placed.  Next the patient's entire left lower extremities prepped and draped in usual sterile fashion.  Longitudinal incision was made in the left groin carried on through subcutaneous tissues down the level left common femoral artery.  The profunda femoris and superficial femoral arteries were dissected free circumferentially and Vesseloops placed around these.  Dissection was carried out up underneath the inguinal ligament and this was dissected free circumferentially.  Vessel loop was also placed around this.  The common femoral artery was fairly thickened but overall soft in character.  Next the greater saphenous vein was harvested through several skip incisions in the medial portion of the leg.  This was done through several skip incisions.  The vein was about 3 mm and fairly uniform diameter.  Once I got down to the above-knee portion of the vein the incision was deepened down to the above-knee popliteal space.  The above-knee popliteal artery was dissected free circumferentially and a vessel loop placed around it.  Again it was thickened but overall fairly soft on palpation.  Next a tunnel was created from the subsartorial space above the knee up to the groin.  The patient was then given 7000 units of intravenous heparin.  After 2 minutes circulation time, the common femoral artery was controlled with a Cooley clamp and the  superficial femoral profunda femoris arteries controlled with Vesseloops.  Longitudinal length was make open the common femoral artery and the vein was reversed spatulated and sewn end of vein to side of artery using a running 6-0 Prolene suture.  Just prior to completion anastomosis it was for blood backbled and thoroughly flushed anastomosis was secured clamps released was pulsatile flow in the graft immediately.  A few repair sutures were placed in the vein as well as 1 at the proximal anastomosis.  The vein was marked for orientation and brought through the subsartorial tunnel.  Popliteal artery was then controlled proximally distally with fine bulldog clamps.  While skin opening was made in the above-knee popliteal artery the vein was cut to length and sewn end of vein to side of artery using a running 6-0 Prolene suture.  Despite completion anastomosis it was for blood backbled thoroughly flushed reanastomosed was secured clamps released was pulsatile flow in the graft immediately.  Patient had good Doppler flow in the peroneal and dorsalis pedis areas of the foot.  Patient has had a palpable dorsalis pedis pulse.  2 repair stitches were placed on the medial aspect of the distal anastomosis.  Hemostasis was obtained.  Patient was given 50 mg of protamine.  Subcutaneous tissues of all incisions were closed with running 2-0 and 3-0 Vicryl suture.  The skin of all incisions closed with a 4-0 Vicryl subcuticular stitch.  Dermabond was applied.  The patient tolerated seizure well and there were no complications.  Incident sponge and needle counts correct the end of the case.  The patient was taken to recovery in stable condition.  Ruta Hinds, MD [facility] [place] Office: [phone]

## 2019-10-22 NOTE — Transfer of Care (Signed)
Immediate Anesthesia Transfer of Care Note  Patient: KALEESE BRUNO  Procedure(s) Performed: LEFT FEMORAL-ABOVE KNEE POPLITEAL  BYPASS GRAFT with REVERSED GREATER SAPHENOUS VEIN (Left Leg Upper)  Patient Location: PACU  Anesthesia Type:General  Level of Consciousness: awake, alert  and oriented  Airway & Oxygen Therapy: Patient Spontanous Breathing and Patient connected to nasal cannula oxygen  Post-op Assessment: Report given to RN and Post -op Vital signs reviewed and stable  Post vital signs: Reviewed and stable  Last Vitals:  Vitals Value Taken Time  BP 130/89 10/22/19 1145  Temp    Pulse 71 10/22/19 1147  Resp 14 10/22/19 1147  SpO2 100 % 10/22/19 1147  Vitals shown include unvalidated device data.  Last Pain:  Vitals:   10/22/19 0633  TempSrc:   PainSc: 0-No pain      Patients Stated Pain Goal: 3 ([id number])  Complications: No apparent anesthesia complications

## 2019-10-23 ENCOUNTER — Encounter (HOSPITAL_COMMUNITY): Payer: Medicare Other

## 2019-10-23 ENCOUNTER — Encounter (HOSPITAL_COMMUNITY): Payer: Self-pay | Admitting: Vascular Surgery

## 2019-10-23 LAB — BASIC METABOLIC PANEL
Anion gap: 10 (ref 5–15)
BUN: 5 mg/dL — ABNORMAL LOW (ref 8–23)
CO2: 22 mmol/L (ref 22–32)
Calcium: 9.3 mg/dL (ref 8.9–10.3)
Chloride: 104 mmol/L (ref 98–111)
Creatinine, Ser: 0.87 mg/dL (ref 0.44–1.00)
GFR calc Af Amer: >60 mL/min (ref >60)
GFR calc non Af Amer: >60 mL/min (ref >60)
Glucose, Bld: 150 mg/dL — ABNORMAL HIGH (ref 70–99)
Potassium: 3.8 mmol/L (ref 3.5–5.1)
Sodium: 136 mmol/L (ref 135–145)

## 2019-10-23 LAB — CBC
HCT: 34.2 % — ABNORMAL LOW (ref 36.0–46.0)
Hemoglobin: 10.3 g/dL — ABNORMAL LOW (ref 12.0–15.0)
MCH: 22.8 pg — ABNORMAL LOW (ref 26.0–34.0)
MCHC: 30.1 g/dL (ref 30.0–36.0)
MCV: 75.7 fL — ABNORMAL LOW (ref 80.0–100.0)
Platelets: 213 10*3/uL (ref 150–400)
RBC: 4.52 MIL/uL (ref 3.87–5.11)
RDW: 14.8 % (ref 11.5–15.5)
WBC: 6.8 10*3/uL (ref 4.0–10.5)
nRBC: 0 % (ref 0.0–0.2)

## 2019-10-23 LAB — GLUCOSE, CAPILLARY
Glucose-Capillary: 148 mg/dL — ABNORMAL HIGH (ref 70–99)
Glucose-Capillary: 143 mg/dL — ABNORMAL HIGH (ref 70–99)

## 2019-10-23 MED ORDER — OXYCODONE-ACETAMINOPHEN 5-325 MG PO TABS: 1.0000 | ORAL_TABLET | Freq: Four times a day (QID) | ORAL | 0 refills | Status: DC | PRN

## 2019-10-23 NOTE — Progress Notes (Signed)
Pt assisted to bathroom this am post foley removal (no void, just felt slight urge). Did well w/ just a standby assist. Assisted to chair for breakfast.

## 2019-10-23 NOTE — TOC Transition Note (Signed)
Transition of Care [facility]) - CM/SW Discharge Note Marvetta Gibbons RN, BSN Transitions of Care Unit 4E- RN Case Manager [phone]   Patient Details  Name: Susan Holmes MRN: [id number] Date of Birth: August 12, 1956  Transition [facility] Care [facility]) CM/SW Contact:  Dawayne Patricia, RN Phone Number: 10/23/2019, 12:14 PM   Clinical Narrative:    Pt stable for transition home, request for RW- order placed- call made to [place] with [place] for DME need- RW to be delivered to room prior to discharge.    Final next level of care: Home/Self Care Barriers to Discharge: No Barriers Identified   Patient Goals and CMS Choice     Choice offered to / list presented to : NA  Discharge Placement          Home             Discharge Plan and Services   Discharge Planning Services: CM Consult            DME Arranged: Walker rolling with seat DME Agency: [employer] Date DME Agency Contacted: 10/23/19 Time DME Agency Contacted: 1000 Representative spoke with at DME Agency: Thedore Mins HH Arranged: NA [place] Agency: NA        Social Determinants of Health ([place]) Interventions     Readmission Risk Interventions Readmission Risk Prevention Plan 10/23/2019  Post Dischage Appt Complete  Medication Screening Complete  Transportation Screening Complete  Some recent data might be hidden

## 2019-10-23 NOTE — Progress Notes (Signed)
[facility] [place]  Subjective  - feels ok a little sore   Objective 121/85 87 98.3 F (36.8 C) (Oral) 19 97%  Intake/Output Summary (Last 24 hours) at 10/23/2019 0921 Last data filed at 10/23/2019 0600 Gross per 24 hour  Intake 1150 ml  Output 2180 ml  Net -1030 ml   No hematoma 2+ left DP pulse  Assessment/Planning: POD #1 left Fem AK pop with vein  Pain controlled.  Wants to go home D/c home today  Ruta Hinds 10/23/2019 9:21 AM --  Laboratory Lab Results: Recent Labs    10/22/19 1200 10/23/19 0500  WBC 7.5 6.8  HGB 10.4* 10.3*  HCT 33.8* 34.2*  PLT 200 213   BMET Recent Labs    10/22/19 1200 10/23/19 0500  NA  --  136  K  --  3.8  CL  --  104  CO2  --  22  GLUCOSE  --  150*  BUN  --  5*  CREATININE 0.72 0.87  CALCIUM  --  9.3    COAG Lab Results  Component Value Date   INR 1.0 10/16/2019   INR 0.94 10/03/2018   INR 1.05 07/23/2016   No results found for: PTT

## 2019-10-23 NOTE — Consult Note (Signed)
   [facility] CM Inpatient Consult   10/23/2019  KAREEN WILLOW January 18, 1956 [id number]  Hospital referral: [facility]  Patient evaluated for community based chronic disease management services with [place] [facility] as a benefit of patient's [facility]  Medicare Insurance.  Referral for Diabetes management with A1C of >10.0.  Plan:  [place] Management Coordinator for Diabetes Management.  Primary Care Provider: [facility]   Of note, [place] [facility] services does not replace or interfere with any services that are arranged by inpatient case management or social work.  For additional questions or referrals please contact:    Natividad Brood, RN BSN [facility] Liaison  [phone] business mobile phone Toll free office [phone]  Fax number: [phone] [email or web address]

## 2019-10-23 NOTE — Discharge Instructions (Signed)
 [facility] [place]  Discharge instructions  Lower Extremity Bypass Surgery  Please refer to the following instruction for your post-procedure care. Your surgeon or physician assistant will discuss any changes with you.  Activity  You are encouraged to walk as much as you can. You can slowly return to normal activities during the month after your surgery. Avoid strenuous activity and heavy lifting until your doctor tells you it's OK. Avoid activities such as vacuuming or swinging a golf club. Do not drive until your doctor give the OK and you are no longer taking prescription pain medications. It is also normal to have difficulty with sleep habits, eating and bowel movement after surgery. These will go away with time.  Bathing/Showering  You may shower after you go home. Do not soak in a bathtub, hot tub, or swim until the incision heals completely.  Incision Care  Clean your incision with mild soap and water. Shower every day. Pat the area dry with a clean towel. You do not need a bandage unless otherwise instructed. Do not apply any ointments or creams to your incision. If you have open wounds you will be instructed how to care for them or a visiting nurse may be arranged for you. If you have staples or sutures along your incision they will be removed at your post-op appointment. You may have skin glue on your incision. Do not peel it off. It will come off on its own in about one week. If you have a great deal of moisture in your groin, use a gauze help keep this area dry.  Diet  Resume your normal diet. There are no special food restrictions following this procedure. A low fat/ low cholesterol diet is recommended for all patients with vascular disease. In order to heal from your surgery, it is CRITICAL to get adequate nutrition. Your body requires vitamins, minerals, and protein. Vegetables are the best source of vitamins and minerals. Vegetables also provide the  perfect balance of protein. Processed food has little nutritional value, so try to avoid this.  Medications  Resume taking all your medications unless your doctor or nurse practitioner tells you not to. If your incision is causing pain, you may take over-the-counter pain relievers such as acetaminophen (Tylenol). If you were prescribed a stronger pain medication, please aware these medication can cause nausea and constipation. Prevent nausea by taking the medication with a snack or meal. Avoid constipation by drinking plenty of fluids and eating foods with high amount of fiber, such as fruits, vegetables, and grains. Take Colase 100 mg (an over-the-counter stool softener) twice a day as needed for constipation. Do not take Tylenol if you are taking prescription pain medications.  Follow Up  Our office will schedule a follow up appointment 2-3 weeks following discharge.  Please call us immediately for any of the following conditions  Severe or worsening pain in your legs or feet while at rest or while walking Increase pain, redness, warmth, or drainage (pus) from your incision site(s) Fever of 101 degree or higher The swelling in your leg with the bypass suddenly worsens and becomes more painful than when you were in the hospital If you have been instructed to feel your graft pulse then you should do so every day. If you can no longer feel this pulse, call the office immediately. Not all patients are given this instruction.  Leg swelling is common after leg bypass surgery.  The swelling should improve over a few months   following surgery. To improve the swelling, you may elevate your legs above the level of your heart while you are sitting or resting. Your surgeon or physician assistant may ask you to apply an ACE wrap or wear compression (TED) stockings to help to reduce swelling.  Reduce your risk of vascular disease  Stop smoking. If you would like help call [facility] at 1-800-QUIT-NOW  (1-800-784-8669) or [place] at 336-586-4000.  Manage your cholesterol Maintain a desired weight Control your diabetes weight Control your diabetes Keep your blood pressure down  If you have any questions, please call the office at [phone]   

## 2019-10-23 NOTE — Progress Notes (Signed)
   10/22/19 1800 10/22/19 1900 10/22/19 2000  Art Line  Arterial Line BP 123/66 126/61 119/59    10/22/19 2025 10/22/19 2100 10/22/19 2200  Art Line  Arterial Line BP 123/57 116/54 102/50    10/22/19 2300 10/23/19 0000 10/23/19 0100  Art Line  Arterial Line BP 95/47 104/49 110/52    10/23/19 0200 10/23/19 0300 10/23/19 0400  Art Line  Arterial Line BP 107/51 118/56 116/53   Pt's BPs stable throughout the night. Labs drawn and line d/c'd. Pressure held for approx 5 min. Site WDL. Will continue to monitor.

## 2019-10-23 NOTE — Evaluation (Addendum)
Occupational Therapy Evaluation and Discharge Patient Details Name: Susan Holmes MRN: [id number] DOB: [birth year] Today's Date: 10/23/2019    History of Present Illness Patient is a 63 y/o female who presents s/p left femoral above knee popliteal bypass graft. PMH includes HTN, HLD, DM, tobacco abuse, CVA, Hep B, depression, CAD, blindness.   Clinical Impression   PTA patient independent. Admitted for above and limited by pain in L LE, impaired balance.  Patient currently requires supervision for transfers and in room mobility using RW, modified independent for toileting, supervision for LB ADLs. Plan to have daughter assist with tub transfers and bathing as needed. Educated on safety, ADL compensatory techniques, DME recommendations.  Educated on use of 3:1 over commode and as shower chair for safety. Reports she will have support from daughter and brother initially. Planned dc home today with no further OT needs identified acutely.  Will sign off.      Follow Up Recommendations  No OT follow up;Supervision - Intermittent    Equipment Recommendations  3 in 1 bedside commode    Recommendations for Other Services       Precautions / Restrictions Precautions Precautions: Fall Restrictions Weight Bearing Restrictions: No      Mobility Bed Mobility               General bed mobility comments: up in chair   Transfers Overall transfer level: Needs assistance Equipment used: Rolling walker (2 wheeled) Transfers: Sit to/from Stand Sit to Stand: Supervision         General transfer comment: supervision for safety, cueing for hand placement and safety (poor descend into chair due to pain)    Balance Overall balance assessment: Mild deficits observed, not formally tested                                         ADL either performed or assessed with clinical judgement   ADL Overall ADL's : Needs assistance/impaired     Grooming:  Supervision/safety;Standing   Upper Body Bathing: Modified independent;Sitting   Lower Body Bathing: Supervison/ safety;Sit to/from stand Lower Body Bathing Details (indicate cue type and reason): reviewed safety with bathing  Upper Body Dressing : Set up;Sitting   Lower Body Dressing: Supervision/safety;Sit to/from stand Lower Body Dressing Details (indicate cue type and reason): reviewed compensatory techniques and safety, able to don/doff socks with increased effort to L LE; supervision sit<>Stand  Toilet Transfer: Supervision/safety;Ambulation;RW;BSC;Regular Glass blower/designer Details (indicate cue type and reason): 3:1 over commode  Toileting- Clothing Manipulation and Hygiene: Modified independent;Sit to/from stand   Tub/ Shower Transfer: Tub transfer;Min guard;Ambulation;3 in 1;Rolling walker Tub/Shower Transfer Details (indicate cue type and reason): simulated reverse step transfer into bathtub, reports daughter can assist  Functional mobility during ADLs: Supervision/safety;Rolling walker General ADL Comments: pt limited by L LE pain     Vision         Perception     Praxis      Pertinent Vitals/Pain Pain Assessment: Faces Faces Pain Scale: Hurts even more Pain Location: LLE Pain Descriptors / Indicators: Sore;Aching;Operative site guarding;Guarding Pain Intervention(s): Repositioned;Monitored during session;Limited activity within patient's tolerance     Hand Dominance Right   Extremity/Trunk Assessment Upper Extremity Assessment Upper Extremity Assessment: Overall WFL for tasks assessed   Lower Extremity Assessment Lower Extremity Assessment: Defer to PT evaluation LLE Deficits / Details: Limited AROM throughout secondary to pain LLE Sensation:  WNL       Communication Communication Communication: No difficulties   Cognition Arousal/Alertness: Awake/alert Behavior During Therapy: WFL for tasks assessed/performed Overall Cognitive Status: Within  Functional Limits for tasks assessed                                 General Comments: Seems WFL for basic mobility tasks. Question overall health literacy/understanding.   General Comments  Incision is clean dry and intact, mild swelling noted on left thigh and sensitive to light touch. Some warmth present as well.    Exercises     Shoulder Instructions      Home Living Family/patient expects to be discharged to:: Private residence Living Arrangements: Children Available Help at Discharge: Family;Available PRN/intermittently Type of Home: Apartment Home Access: Level entry     Home Layout: Two level;Bed/bath upstairs Alternate Level Stairs-Number of Steps: has stair lift   Bathroom Shower/Tub: Teacher, early years/pre: Standard     Home Equipment: Cane - single point          Prior Functioning/Environment Level of Independence: Independent with assistive device(s)        Comments: Uses SPC for ambulation, reports difficulty with walking due to pain. Does own ADLs and some IADLs but is inquiring about assist with meals.Drives.        OT Problem List: Decreased activity tolerance;Pain;Decreased knowledge of precautions;Decreased knowledge of use of DME or AE      OT Treatment/Interventions:      OT Goals(Current goals can be found in the care plan section) Acute Rehab OT Goals Patient Stated Goal: home today  OT Goal Formulation: With patient  OT Frequency:     Barriers to D/C:            Co-evaluation              AM-PAC OT "6 Clicks" Daily Activity     Outcome Measure Help from another person eating meals?: None Help from another person taking care of personal grooming?: A Little Help from another person toileting, which includes using toliet, bedpan, or urinal?: None Help from another person bathing (including washing, rinsing, drying)?: A Little Help from another person to put on and taking off regular upper body  clothing?: None Help from another person to put on and taking off regular lower body clothing?: A Little 6 Click Score: 21   End of Session Equipment Utilized During Treatment: Rolling walker Nurse Communication: Mobility status  Activity Tolerance: Patient tolerated treatment well Patient left: in chair;with call bell/phone within reach;with nursing/sitter in room  OT Visit Diagnosis: Pain Pain - Right/Left: Left Pain - part of body: Leg                Time: [id number] OT Time Calculation (min): 20 min Charges:  OT General Charges $OT Visit: 1 Visit OT Evaluation $OT Eval Moderate Complexity: 1 Mod  Delight Stare, OT Acute Rehabilitation Services Pager [phone] Office [phone]   Delight Stare 10/23/2019, 10:49 AM

## 2019-10-23 NOTE — Evaluation (Signed)
Physical Therapy Evaluation Patient Details Name: Susan Holmes MRN: [id number] DOB: Dec 07, 1956 Today's Date: 10/23/2019   History of Present Illness  Patient is a 63 y/o female who presents s/p left femoral above knee popliteal bypass graft. PMH includes HTN, HLD, DM, tobacco abuse, CVA, Hep B, depression, CAD, blindness.  Clinical Impression  Patient presents with pain, impaired balance and impaired mobility s/p above. Pt reports being Mod I PTA using SPC for support and lives with daughter. Has been having difficulty with walking due to pain. Today, pt tolerated transfers and gait training with Min guard assist for balance/safety and use of RW. Pt agreeable to use RW at home. Inquiring about meal support at home. RN notified to contact social work about info regarding meals on wheels. HR ranged from 97-112 bpm during activity. Pt declining [place] services, reports having support of daughter when she is not working. Will follow acutely to maximize independence and mobility prior to return home.    Follow Up Recommendations No PT follow up;Supervision for mobility/OOB(declines HH services)    Equipment Recommendations  Rolling walker with 5" wheels    Recommendations for Other Services       Precautions / Restrictions Precautions Precautions: Fall Restrictions Weight Bearing Restrictions: No      Mobility  Bed Mobility               General bed mobility comments: Sitting in chair upon PT arrival.  Transfers Overall transfer level: Needs assistance Equipment used: Rolling walker (2 wheeled) Transfers: Sit to/from Stand Sit to Stand: Min guard         General transfer comment: Min guard for safety. Slow to rise from chair with cues for hand placement and uncontrolled descent into chair due to pain.  Ambulation/Gait Ambulation/Gait assistance: Min guard Gait Distance (Feet): 80 Feet Assistive device: Rolling walker (2 wheeled) Gait Pattern/deviations: Step-through  pattern;Decreased stride length;Trunk flexed Gait velocity: decreased   General Gait Details: Slow, mostly steady gait with use of RW for support; cues for RW management. HR up to 112 bpm. 2/4 DOE.  Stairs            Wheelchair Mobility    Modified Rankin (Stroke Patients Only)       Balance Overall balance assessment: Mild deficits observed, not formally tested                                           Pertinent Vitals/Pain Pain Assessment: Faces Faces Pain Scale: Hurts even more Pain Location: LLE Pain Descriptors / Indicators: Sore;Aching;Operative site guarding;Guarding Pain Intervention(s): Repositioned;Monitored during session;RN gave pain meds during session    [place] expects to be discharged to:: Private residence Living Arrangements: Children Available Help at Discharge: Family;Available PRN/intermittently Type of Home: Apartment Home Access: Level entry     Home Layout: Two level;Bed/bath upstairs Home Equipment: Cane - single point      Prior Function Level of Independence: Independent with assistive device(s)         Comments: Uses SPC for ambulation, reports difficulty with walking due to pain. Does own ADLs and some IADLs but is inquiring about assist with meals.Drives.     Hand Dominance   Dominant Hand: Right    Extremity/Trunk Assessment   Upper Extremity Assessment Upper Extremity Assessment: Defer to OT evaluation    Lower Extremity Assessment Lower Extremity Assessment: LLE deficits/detail LLE  Deficits / Details: Limited AROM throughout secondary to pain LLE Sensation: WNL       Communication   Communication: No difficulties  Cognition Arousal/Alertness: Awake/alert Behavior During Therapy: WFL for tasks assessed/performed Overall Cognitive Status: No family/caregiver present to determine baseline cognitive functioning                                 General Comments:  Seems WFL for basic mobility tasks. Question overall health literacy/understanding.      General Comments General comments (skin integrity, edema, etc.): Incision is clean dry and intact, mild swelling noted on left thigh and sensitive to light touch. Some warmth present as well.    Exercises     Assessment/Plan    PT Assessment Patient needs continued PT services  PT Problem List Decreased mobility;Decreased strength;Decreased safety awareness;Pain;Decreased balance;Decreased knowledge of use of DME;Decreased activity tolerance;Cardiopulmonary status limiting activity;Decreased cognition;Decreased skin integrity       PT Treatment Interventions Therapeutic activities;Gait training;Patient/family education;Therapeutic exercise;Balance training;Functional mobility training;DME instruction    PT Goals (Current goals can be found in the Care Plan section)  Acute Rehab PT Goals Patient Stated Goal: to get help with meals at home PT Goal Formulation: With patient Time For Goal Achievement: 11/06/19 Potential to Achieve Goals: Good    Frequency Min 3X/week   Barriers to discharge Decreased caregiver support      Co-evaluation               AM-PAC PT "6 Clicks" Mobility  Outcome Measure Help needed turning from your back to your side while in a flat bed without using bedrails?: A Little Help needed moving from lying on your back to sitting on the side of a flat bed without using bedrails?: A Little Help needed moving to and from a bed to a chair (including a wheelchair)?: A Little Help needed standing up from a chair using your arms (e.g., wheelchair or bedside chair)?: A Little Help needed to walk in hospital room?: A Little Help needed climbing 3-5 steps with a railing? : A Little 6 Click Score: 18    End of Session Equipment Utilized During Treatment: Gait belt Activity Tolerance: Patient tolerated treatment well Patient left: in chair;with call bell/phone within  reach;Other (comment)(with OT present) Nurse Communication: Mobility status PT Visit Diagnosis: Pain Pain - Right/Left: Left Pain - part of body: Leg    Time: [id number] PT Time Calculation (min) (ACUTE ONLY): 15 min   Charges:   PT Evaluation $PT Eval Moderate Complexity: 1 Mod          Wray Kearns, PT, DPT Acute Rehabilitation Services Pager [phone] Office [phone]      Susan Holmes 10/23/2019, 9:27 AM

## 2019-10-23 NOTE — Progress Notes (Signed)
Discharge instructions given to patient. Medications and wound care reviewed. All questions answered. IV removed, clean, dry and intact. Patient discharged home with brother.   Arletta Bale, RN

## 2019-10-24 NOTE — Discharge Summary (Signed)
Physician Discharge Summary   Patient ID: Susan Holmes [id number] 63 y.o. 1956-05-23  Admit date: 10/22/2019  Discharge date and time: 10/23/2019 12:56 PM   Admitting Physician: Elam Dutch, MD   Discharge Physician: same  Admission Diagnoses: CLAUDICATION  Discharge Diagnoses: same  Admission Condition: fair  Discharged Condition: fair  Indication for Admission: Lifestyle limiting claudication left lower extremity  Hospital Course: [name] is a 63 year old female who was brought in as an outpatient for left femoral to above-the-knee popliteal bypass with vein by Dr. Oneida Alar on 10/22/2019 due to lifestyle limiting claudication.  She tolerated the procedure well and was admitted to the hospital postoperatively.  POD #1 she had a palpable anterior tibial artery pulse left lower extremity.  Patient was also feeling well enough for discharge home.  She will follow-up in office in about 2 weeks to see Dr. Oneida Alar.  She will be prescribed 2 to 3 days of narcotic pain medication for continued postoperative pain control.  Discharge instructions were reviewed with the patient and she voiced her understanding.  She was discharged home in stable condition.  Consults: None  Treatments: surgery: Left femoral to above-the-knee popliteal bypass with vein by Dr. Oneida Alar on 10/22/2019  Discharge Exam: See progress note 10/23/2019 Vitals:   10/23/19 0515 10/23/19 1115  BP: 121/85 (!) 111/91  Pulse:  82  Resp: 19 16  Temp: 98.3 F (36.8 C) 98.5 F (36.9 C)  SpO2: 97% 97%     Disposition: Home  - For VQI Registry use ---  Post-op:  Wound infection: No  Graft infection: No  Transfusion: No  New Arrhythmia: No Patency judged by: '[ ]'  Dopper only, '[ ]'  Palpable graft pulse, [x ] Palpable distal pulse, '[ ]'  ABI inc. > 0.15, '[ ]'  Duplex  D/C Ambulatory Status: Ambulatory  Complications: MI: [x ] No, '[ ]'  Troponin only, '[ ]'  EKG or Clinical CHF: No Resp failure: [x ] none, [  ] Pneumonia, '[ ]'  Ventilator Chg in renal function: [x ] none, '[ ]'  Inc. Cr > 0.5, '[ ]'  Temp. Dialysis, '[ ]'  Permanent dialysis Stroke: [x ] None, '[ ]'  Minor, '[ ]'  Major Return to OR: No  Reason for return to OR: '[ ]'  Bleeding, '[ ]'  Infection, '[ ]'  Thrombosis, '[ ]'  Revision  Discharge medications: Statin use:  Yes ASA use:  Yes Plavix use:  No  for medical reason Not indicated Beta blocker use: Yes Coumadin use: No  for medical reason Not indicated    Patient Instructions:  Allergies as of 10/23/2019      Reactions   Ace Inhibitors Cough   Lipitor [atorvastatin] Other (See Comments)   MYALGIA   Chantix [varenicline] Nausea And Vomiting      Medication List    TAKE these medications   Accu-Chek FastClix Lancets Misc CHECK BLOOD SUGAR THREE TIMES DAILY AS DIRECTED. DIAG CODE E 11.65. INSULIN DEPENDENT   Accu-Chek Guide w/Device Kit 1 each by Does not apply route 3 (three) times daily.   amLODipine 10 MG tablet Commonly known as: NORVASC Take 0.5 tablets (5 mg total) by mouth every morning. What changed: when to take this Notes to patient: Take tomorrow morning (10/28).   aspirin EC 81 MG tablet Take 81 mg by mouth daily. Notes to patient: Take tomorrow morning (10/28).   budesonide-formoterol 160-4.5 MCG/ACT inhaler Commonly known as: Symbicort Inhale 2 puffs into the lungs 2 (two) times daily as needed. What changed: reasons to take this   carvedilol  12.5 MG tablet Commonly known as: COREG TAKE 1 TABLET(12.5 MG) BY MOUTH TWICE DAILY What changed:   how much to take  how to take this  when to take this  additional instructions Notes to patient: Take this evening (10/27).   chlorthalidone 25 MG tablet Commonly known as: HYGROTON TAKE 1/2 TABLET BY MOUTH DAILY Notes to patient: Take tomorrow morning (10/28).   cyproheptadine 4 MG tablet Commonly known as: PERIACTIN Take 4 mg by mouth 2 (two) times daily. Notes to patient: Take this evening (10/27).    dorzolamide-timolol 22.3-6.8 MG/ML ophthalmic solution Commonly known as: COSOPT Place 1 drop into both eyes every evening.   DULoxetine 30 MG capsule Commonly known as: CYMBALTA Take 30 mg by mouth daily. Notes to patient: Take tomorrow morning (10/28).   fluticasone 50 MCG/ACT nasal spray Commonly known as: Flonase Place 1 spray into both nostrils daily. What changed:   when to take this  reasons to take this   hydrOXYzine 25 MG tablet Commonly known as: ATARAX/VISTARIL Take 25 mg by mouth 3 (three) times daily as needed for anxiety.   insulin lispro 100 UNIT/ML KwikPen Commonly known as: HumaLOG KwikPen Inject 0.05 mLs (5 Units total) into the skin 3 (three) times daily with meals. Notes to patient: Take as you were at home.   Lantus SoloStar 100 UNIT/ML Solostar Pen Generic drug: Insulin Glargine Inject 28 Units into the skin at bedtime. Notes to patient: Take this evening (10/27).   losartan 100 MG tablet Commonly known as: Cozaar Take 1 tablet (100 mg total) by mouth daily. Notes to patient: Take as you were at home.   mirtazapine 45 MG tablet Commonly known as: REMERON Take 45 mg by mouth at bedtime. Notes to patient: Take this evening (10/27).   multivitamin with minerals Tabs tablet Take 1 tablet by mouth daily. Notes to patient: Take as you were at home.   OneTouch Ultra test strip Generic drug: glucose blood USE TO TEST BLOOD GLUCOSE THREE TIMES DAILY   oxyCODONE-acetaminophen 5-325 MG tablet Commonly known as: PERCOCET/ROXICET Take 1 tablet by mouth every 6 (six) hours as needed for moderate pain. Notes to patient: Take as needed for pain.   prednisoLONE acetate 1 % ophthalmic suspension Commonly known as: PRED FORTE Place 1 drop into the right eye daily. Notes to patient: Take as you were at home.   rosuvastatin 10 MG tablet Commonly known as: CRESTOR Take 2 tablets (20 mg total) by mouth daily. Notes to patient: Take tomorrow morning  (10/28).   traZODone 100 MG tablet Commonly known as: DESYREL Take 300 mg by mouth at bedtime. Notes to patient: Take this evening (10/27).   Trulicity 1.5 CX/5.0HK Sopn Generic drug: Dulaglutide Inject 1.5 mg into the skin once a week. What changed: when to take this Notes to patient: Take as you were at home.   ValuMark Pen Needles 31G X 8 MM Misc Generic drug: Insulin Pen Needle 1 each by Does not apply route daily. Notes to patient: Take as you were at home.      Activity: activity as tolerated Diet: regular diet Wound Care: keep wound clean and dry  Follow-up with Dr. Oneida Alar in 2 weeks.  SignedDagoberto Ligas 10/24/2019 8:28 AM

## 2019-10-25 ENCOUNTER — Encounter: Payer: Self-pay | Admitting: *Deleted

## 2019-10-25 ENCOUNTER — Other Ambulatory Visit: Payer: Self-pay | Admitting: *Deleted

## 2019-10-25 NOTE — Patient Outreach (Signed)
Susan [facility]) [name] Telephone Outreach PCP office completes Transition of Care follow up post-hospital discharge Post-hospital discharge day # 2  10/25/2019  Susan Holmes Jan 15, 1956 [id number]  Successful telephone outreach to Susan Holmes, 63 y/o female referred to [place] by [facility] Liaison after recent planned hospital visit October 26-27, 2020 for (L) femoral popliteal bypass due to PAD/ PVD.  Patient was discharged home to self care without home health services.  Patient has history including, but not limited to, PAD/PVD with claudication; HTN/ HLD; DM-II with insulin therapy; previous CVA; glaucoma; depression.  THN CM services discussed with patient who has been previously active with [facility] CM and is familiar with services- patient provides verbal consent today for [facility] CM involvement in her care.  Patient reports "doing well" after her recent surgery and she states that her post-operative pain is well controlled with prescribed medications, at "3/10."  Patient denies new (or old) falls and reports using rolling walker for ambulation post-hospital discharge; states she is "getting around real good."  Denies concerns post-hospital discharge.  Pleasant 55 minute phone call.  Medications: -- Has all medicationsand takes as prescribed;denies questions/ concerns with medications.  -- Verbalizes good general understanding of the purpose, dosing, and scheduling of medications.   -- self-manage medications using weekly pill planner box. -- denies issues with swallowing medications -- patient was recently discharged from the hospital and all medications were thoroughly reviewed with patient today- no concerns or discrepancies were noted; however, patient reported that she "thinks" she has been taking 10 mg amlodipine (as opposed to prescribed 5 mg) and this was clarified with patient today, who verbalized a good understanding of  same  Provider appointments: -- All recent and upcoming provider appointments were reviewed with patient today- patient verbalizes plans to attend all and accurate report of scheduled office visits post-hospital discharge  Safety/ Mobility/ Falls: -- denies new/ recent falls -- reports no falls over last year -- assistive devices: using walker/ cane as needed for chronic pain of claudication/ recent surgery for (L) fem-pop; hopes to be able to walk independently as she recuperates -- general fall risks/ prevention education discussed with patient today  Holiday representative needs: -- currently Holiday representative need for food insecurity; states she gets $20 in food stamps and could use additional assistance; denies acute food insecurity, states that she currently '"as enough food at the house:" will place [facility] CSW referral/ patient agreeable -- supportive family members that assist with care needs as indicated; local adult daughter is staying with patient temporarily post- hospital discharge, and assists patient with care needs as indicated when needed -- patient has established transportation through medicaid to all provider appointments; daughter assists with transportation to other errands -- SDOH updated for depression; food insecurity; smoking; and transportation  Advanced Directive (AD) Planning:   -- reports does not currently have exisisting Advanced Directives in place and declines desire for information on creating -- endorses that she is a "full code"  Self-health management of PAD/ PVD and DM: -- continues to struggle with efforts to decrease/ stop smoking; states that she "knows" she needs to, but that she finds herself worrying about things and is unable-- support/ information provided; encouraged patient to continue her efforts and to discuss smoking cessation treatment with her care providers to determine if pharmacologic (nicotine patches, etc) treatment would be  recommended encouraged patient to discuss with care providers prior to starting any pharmacological treatment, even  if OTC, due to her numerous co-morbidities and ongoing smoking routine -- surgical site "looks good;" denies concerns  -- acknowledges that her A1-C was recently increased, "it was out the roof," and feels that she "just got off track" with most recent A1-C, as this was previously under control -- monitors/ records blood sugars QD "every evening," with blood sugar ranges reported as "all in the low 200's": patient states she "knows" how to eat for blood sugar control and will start doing so   Plan:  Patient will take medications as prescribed and will attend all scheduled provider appointments  Patient will promptly notify care providers for any new concerns/ issues/ problems that arise  Patient will continue her efforts to decrease/ stop smoking  Patient will continue monitoring/ recording daily blood sugars   I will place printed educational material in mail to patient around smoking cessation and dietary strategies to control high blood sugar  I will make patient's PCP aware of [facility] RN CM involvement in patient's care-- will send barriers letter and share today's [facility] CM notes/ care plan as initial assessment  [facility] CM outreach to continue with scheduled phone call within next 3 weeks, post- upcoming scheduled provider appointments  [facility] CM Care Plan Problem One     Most Recent Value  Care Plan Problem One  High risk for hospital readmission related to/ as evidenced by recent hospitalization for planned vascular surgery October 26-27, 2020 in patient with DM  Role Documenting the Problem One  Care Management Coordinator  Care Plan for Problem One  Active  THN Long Term Goal   Over the next 31 days, patient will not experience unplanned hospital re-admission, as evidenced by patient reporting and review of EMR during [place] Term Goal  Start Date  10/25/19  Interventions for Problem One Long Term Goal  Discussed current clinical condition with patient and confirmed that she has no immediate/ current clinical concerns,  reviewed post- hospital discharge instructions and medications,  TH[facility] CM program initiated and initial assessment completed and sent o patient's PCP,  placed [facility] CSW referral to address patient's request for nutritional resources for food acquisition  THN CM Short Term Goal #1   Over the next 30 days, patient will attend all scheduled provider appointments, as evidenced by patient reporting and collaboration with care providers as indicated during [facility] RN CM outreach  [facility] CM Short Term Goal #1 Start Date  10/25/19  Interventions for Short Term Goal #1  Reviewed recent and upcoming provider appointments with patient and confirmed that she plans to attend all and has no transportation issues  THN CM Short Term Goal #2   Over the next 20 days, patient will continue decreasing the amount of cigarettes she smokes daily, as evidenced by patient reporting during [facility] RN CM outreach  [facility] CM Short Term Goal #2 Start Date  10/25/19  Interventions for Short Term Goal #2  Discussed patient's personal triggers for smoking and her current amount of daily smoking,  discussed previously taken efforts to stop smoking and discussed strategies for smoking cessation,  placed printed educational material in mail to patient aorund smoking cessation,  encouraged patient to discuss use of nicotene patches in efforts to quit with her care providers    [facility] CM Care Plan Problem Two     Most Recent Value  Care Plan Problem Two  Self-health management of chronic disease state of DM, as evidenced by patient reporting  Role Documenting the Problem Two  Care Management [place] for Problem Two  Active  THN CM Short Term Goal #1   Over the next 20 days, patient will see a decrease in her daily blood sugar readings, as evidenced by patient  reporting and review of same with patient during [place] outreach     I appreciate the opportunity to participate in [place] care,  Oneta Rack, RN, BSN, [employer] Coordinator [facility] Care Management  [phone]

## 2019-10-26 ENCOUNTER — Encounter: Payer: Self-pay | Admitting: *Deleted

## 2019-10-26 ENCOUNTER — Other Ambulatory Visit: Payer: Self-pay

## 2019-10-26 NOTE — Patient Outreach (Signed)
[facility]) Care Management  10/26/2019  Susan Holmes 08-27-1956 AT:[id number]   Social Work referral received from [facility], Reginia Naas, to contact patient regarding food resources.  Successful outreach to patient today.  Patient receives $20/month in food stamps.  Informed patient about [facility] [place] [facility] which lists all food pantries and kitchens in [place].  Patient already has copies of these books.  Encouraged patient to utilize this resource.  Some pantries require a referral so I provided her with my contact information if this is needed.  Patient inquired about resource for clothing.  Gave her contact information for [employer], [employer], [place], [employer] [place]. Also informed patient of [place] [employer] that can be utilized for various resources. Closing social work case at this time but encouraged patient to call if additional needs arise.  Ronn Melena, BSW Social Worker [phone]

## 2019-11-01 ENCOUNTER — Other Ambulatory Visit: Payer: Self-pay

## 2019-11-01 DIAGNOSIS — I739 Peripheral vascular disease, unspecified: Secondary | ICD-10-CM

## 2019-11-02 ENCOUNTER — Other Ambulatory Visit: Payer: Self-pay | Admitting: Internal Medicine

## 2019-11-02 DIAGNOSIS — E1165 Type 2 diabetes mellitus with hyperglycemia: Secondary | ICD-10-CM

## 2019-11-04 ENCOUNTER — Other Ambulatory Visit: Payer: Self-pay | Admitting: Pharmacist

## 2019-11-04 DIAGNOSIS — E1169 Type 2 diabetes mellitus with other specified complication: Secondary | ICD-10-CM

## 2019-11-04 MED ORDER — ROSUVASTATIN CALCIUM 10 MG PO TABS: 20.0000 mg | ORAL_TABLET | Freq: Every day | ORAL | 3 refills | Status: DC

## 2019-11-05 ENCOUNTER — Telehealth: Payer: Self-pay | Admitting: *Deleted

## 2019-11-05 NOTE — Telephone Encounter (Signed)
Call to [facility] for PA for Rosuvastatin 10 mg tablets.  Information was given.  For review awaiting determination within 24 hours.  Case PA # [id number].  Option also given to do 1 20 mg tablet daily if patient will be able tolerate.  Message to be sent to Dr. Marva Panda to consider if PA is denied.  Sander Nephew, RN 11/05/2019 11:44 AM

## 2019-11-06 MED ORDER — ROSUVASTATIN CALCIUM 20 MG PO TABS: 20.0000 mg | ORAL_TABLET | Freq: Every day | ORAL | 11 refills | Status: DC

## 2019-11-06 NOTE — Telephone Encounter (Signed)
Prior history of myalgias with high dose statin. However, patient has been taking rosuvastatin 20mg  (2 pills of 10mg ) and has been tolerating it well. Will change to 1 20mg  tablet.

## 2019-11-07 ENCOUNTER — Telehealth (HOSPITAL_COMMUNITY): Payer: Self-pay

## 2019-11-07 NOTE — Telephone Encounter (Signed)

## 2019-11-08 ENCOUNTER — Ambulatory Visit (INDEPENDENT_AMBULATORY_CARE_PROVIDER_SITE_OTHER): Payer: Self-pay | Admitting: Physician Assistant

## 2019-11-08 ENCOUNTER — Ambulatory Visit (HOSPITAL_COMMUNITY)
Admission: RE | Admit: 2019-11-08 | Discharge: 2019-11-08 | Disposition: A | Discharge status: Home / Self Care | Payer: Medicare Other | Source: Ambulatory Visit | Attending: Physician Assistant | Admitting: Physician Assistant

## 2019-11-08 ENCOUNTER — Other Ambulatory Visit: Payer: Self-pay

## 2019-11-08 VITALS — BP 124/85 | HR 80 | Temp 97.9°F | Resp 14 | Ht 67.0 in | Wt 156.0 lb

## 2019-11-08 DIAGNOSIS — I739 Peripheral vascular disease, unspecified: Secondary | ICD-10-CM

## 2019-11-08 MED ORDER — OXYCODONE-ACETAMINOPHEN 5-325 MG PO TABS: 1.0000 | ORAL_TABLET | Freq: Four times a day (QID) | ORAL | 0 refills | Status: DC | PRN

## 2019-11-08 NOTE — Progress Notes (Signed)
    Postoperative Visit   History of Present Illness   Susan Holmes is a 63 y.o. year old female who presents for postoperative follow-up for left femoral to above-the-knee popliteal bypass with vein by Dr. Oneida Alar on 10/22/2019.  The patient is having pain at incision sites especially left groin.  She denies any rest pain or tissue loss left foot.  She is also complaining of edema of left lower extremity.  She is taking an aspirin and statin daily.   For VQI Use Only   PRE-ADM LIVING: Home  AMB STATUS: Ambulatory   Physical Examination   Vitals:   11/08/19 1501  BP: 124/85  Pulse: 80  Resp: 14  Temp: 97.9 F (36.6 C)  TempSrc: Temporal  SpO2: 100%  Weight: 156 lb (70.8 kg)  Height: 5\' 7"  (1.702 m)    LLE: Left above-the-knee popliteal incision with some firmness likely fluid collection, no drainage; other vein harvest incisions healing well and are clean dry and intact; left groin incision with minimal skin layer dehiscence, no purulent drainage; palpable left DP pulse   Medical Decision Making   Susan Holmes is a 63 y.o. year old female who presents s/p left femoral to above-the-knee popliteal bypass with vein.  . Patent bypass with palpable left DP pulse . Some skin layer dehiscence of left groin incision about 1 cm of separation however wound bed appears healthy and clean; encouraged to cleanse with soap and water twice daily and pat dry . Encouraged patient to elevate her leg when she is not walking during the day to help with edema . #25 5/325 mg Percocet prescribed for ongoing postop pain . Follow-up in a couple weeks to recheck left groin incision   Dagoberto Ligas PA-C [facility] Office: [phone]  Clinic MD: Dr. Oneida Alar

## 2019-11-12 ENCOUNTER — Encounter: Payer: Self-pay | Admitting: *Deleted

## 2019-11-12 ENCOUNTER — Other Ambulatory Visit: Payer: Self-pay | Admitting: *Deleted

## 2019-11-12 NOTE — Patient Outreach (Signed)
Susan Holmes) [place] Telephone Outreach PCP office completes Transition of Care follow up post-hospital discharge Post-hospital discharge day # 20  11/12/2019  Susan Holmes [birth year] AT:[id number]  Successful telephone outreach to [name], 63 y/o female referred to [place] by [facility] Liaison after recent planned hospital visit October 26-27, 2020 for (L) femoral popliteal bypass due to PAD/ PVD.  Patient was discharged home to self care without home health services.  Patient has history including, but not limited to, PAD/PVD with claudication; HTN/ HLD; DM-II with insulin therapy; previous CVA; glaucoma; depression.  HIPAA/ identity verified during phone call today.  Patient reports that she has "been doing very good," and she reports ongoing post-operative pain in (L) lower extremity at "6/10;" and states that pain is well controlled with prescribed pain medication, elevating extremity as instructed, and repositioning.  Patient denies new/ recent falls at home post- surgery and she sounds to be in no distress throughout phone call today.  Denies concerns around clinical status.  Patient further reports: -- attended recent vascular surgical provider office visit 11/12/20202; "got a good report;" states that she discussed surgical site with provider, who instructed her to elevate her extremity post- surgery for "expected" lower extremity swelling/ pain; states this has helped ---- reviewed with patient and confirmed that she has accurate understanding of upcoming scheduled provider appointments and plans ot continue using established transportation services through medicaid  -- continues self- managing medications and denies concerns around medications  -- has spoken to [place] around previously stated community resource needs for nutrition, assistance with procuring clothing and bill assistance; patient denies ongoing commuity resource  needs and she was encouraged to promptly follow up on resources as provided by [employer] of PAD/ PVD and DM: -- continues to struggle with efforts to decrease/ stop smoking; states that she "has done pretty good;" and has decreased her smoking post- recent surgery "down to about 6/ day."  She confirms that she received previously provided educational material around smoking cessation and "is still working on it."  Positive reinforcement provided and patient was encouraged to continue her efforts -- surgical site "still looks good;" we reviewed signs/ symptoms post-op incisional infection along with action plan for same- encouraged patient to promptly notify care providers for any new concerns/ issues/ problems that arise -- blood sugars "running better;" again confirms that she received recently provided printed educational material around self-health management/ dietary strategies for decreasing blood sugars/ reviewed with patient:  Reports she has "stopped eating all sugar," and is trying to "eat healthier."  Reports blood sugars at home over last 2 weeks have been running "between 100-200; down from 300 ranges;" positive reinforcement provided and patient was encouraged to continue her efforts.  Continues monitoring blood sugars "once daily at night"   Patient denies further issues, concerns, or problems today.  I confirmed that patient has my direct phone number, the main [facility] CM office phone number, and the [facility] CM 24-hour nurse advice phone number should issues arise prior to next scheduled [place] outreach.  Encouraged patient to contact me directly if needs, questions, issues, or concerns arise prior to next scheduled outreach; patient agreed to do so.  Plan:  Patient will take medications as prescribed and will attend all scheduled provider appointments  Patient will promptly notify care providers for any new concerns/ issues/ problems that arise  Patient  will continue her efforts to decrease/ stop  smoking  Patient will continue monitoring/ recording daily blood sugars and limiting carb/ sugar intake  [facility] CM outreach to continue with scheduled phone call within next 3 weeks, post- upcoming scheduled provider appointments  [facility] CM Care Plan Problem One     Most Recent Value  Care Plan Problem One  High risk for hospital readmission related to/ as evidenced by recent hospitalization for planned vascular surgery October 26-27, 2020 in patient with DM  Role Documenting the Problem One  Care Management [place] for Problem One  Active  THN Long Term Goal   Over the next 22 days, patient will not experience unplanned hospital re-admission, as evidenced by patient reporting and review of EMR during [facility] RN CM Outreach [Goal re-established]  THN Long Term Goal Start Date  11/12/19 [Goal re-established]  Interventions for Problem One Long Term Goal  Discussed with patient her current clinical condition and confirmed that she has no acute/ urgent clinical concerns,  using teach back method, reviewed with patient signs/ symptoms post-op incision infection and encouraged patient to promptly report should any of the signs/ symptoms arise,  discussed patient's post-op pain levels and pain management  THN CM Short Term Goal #1   Over the next 22 days, patient will attend all scheduled provider appointments, as evidenced by patient reporting and collaboration with care providers as indicated during [facility] RN CM outreach [Goal re-established]  THN CM Short Term Goal #1 Start Date  11/12/19 [Goal re-established]  Interventions for Short Term Goal #1  Reviewed with patient all previous and upcoming scheduled provider appointments,  confirmed that patient is aware of all and plans to attend all, using established transportation services  THN CM Short Term Goal #2   Over the next 22 days, patient will continue decreasing the amount of cigarettes she  smokes daily, as evidenced by patient reporting during [facility] RN CM outreach [Goal re-established]  THN CM Short Term Goal #2 Start Date  11/12/19 [Goal re-established]  Interventions for Short Term Goal #2  Discussed patient's efforts to decrease smoking post- recent surgery,  confirmed that patient received and has reviewed previously mailed printed educational material around smoking cessation,  enocuraged patient to continue her efforts to decrease smoking habits    THN CM Care Plan Problem Two     Most Recent Value  Care Plan Problem Two  Self-health management of chronic disease state of DM, as evidenced by patient reporting  Role Documenting the Problem Two  Care Management Coordinator  Care Plan for Problem Two  Active  THN CM Short Term Goal #1   Over the next 22 days, patient will see a decrease in her daily blood sugar readings, as evidenced by patient reporting and review of same with patient during [facility] RN CM outreach [Goal re-established]  THN CM Short Term Goal #1 Start Date  11/12/19 [Goal re-established]  Interventions for Short Term Goal #2   Using teach back method, reviewed with patient previously provided printed educational materials around food choices in setting of DM and recent blood sugars,  reviewed recent blood sugars at home with patient and provided positive reinforcement for patient's stated decreased blood sugar readings at home post- recent surgery     Oneta Rack, RN, BSN, [place] Coordinator [facility] [place] Care Management  [phone]

## 2019-11-15 ENCOUNTER — Ambulatory Visit (INDEPENDENT_AMBULATORY_CARE_PROVIDER_SITE_OTHER): Payer: Medicare Other | Admitting: Pharmacist

## 2019-11-15 ENCOUNTER — Other Ambulatory Visit: Payer: Self-pay

## 2019-11-15 VITALS — BP 126/82 | HR 83 | Resp 16 | Ht 67.0 in | Wt 157.6 lb

## 2019-11-15 DIAGNOSIS — E1169 Type 2 diabetes mellitus with other specified complication: Secondary | ICD-10-CM | POA: Diagnosis not present

## 2019-11-15 DIAGNOSIS — F17200 Nicotine dependence, unspecified, uncomplicated: Secondary | ICD-10-CM

## 2019-11-15 DIAGNOSIS — E785 Hyperlipidemia, unspecified: Secondary | ICD-10-CM | POA: Diagnosis not present

## 2019-11-15 MED ORDER — VARENICLINE TARTRATE 0.5 MG PO TABS: 0.5000 mg | ORAL_TABLET | Freq: Two times a day (BID) | ORAL | 0 refills | Status: DC

## 2019-11-15 MED ORDER — ROSUVASTATIN CALCIUM 20 MG PO TABS: 20.0000 mg | ORAL_TABLET | Freq: Every day | ORAL | 6 refills | Status: DC

## 2019-11-15 NOTE — Patient Instructions (Addendum)
[facility] (pharmacist) Melanny Wire/Kristin  *START taking crestor (rosuvastatin) 20mg  every evening* *START taking chantix 0.5mg  twice daily for smoking cessation* *Call back at [phone] if having problems with medication*   High Cholesterol  High cholesterol is a condition in which the blood has high levels of a white, waxy, fat-like substance (cholesterol). The human body needs small amounts of cholesterol. The liver makes all the cholesterol that the body needs. Extra (excess) cholesterol comes from the food that we eat. Cholesterol is carried from the liver by the blood through the blood vessels. If you have high cholesterol, deposits (plaques) may build up on the walls of your blood vessels (arteries). Plaques make the arteries narrower and stiffer. Cholesterol plaques increase your risk for heart attack and stroke. Work with your health care provider to keep your cholesterol levels in a healthy range. What increases the risk? This condition is more likely to develop in people who:  Eat foods that are high in animal fat (saturated fat) or cholesterol.  Are overweight.  Are not getting enough exercise.  Have a family history of high cholesterol. What are the signs or symptoms? There are no symptoms of this condition. How is this diagnosed? This condition may be diagnosed from the results of a blood test.  If you are older than age 24, your health care provider may check your cholesterol every 4-6 years.  You may be checked more often if you already have high cholesterol or other risk factors for heart disease. The blood test for cholesterol measures:  "Bad" cholesterol (LDL cholesterol). This is the main type of cholesterol that causes heart disease. The desired level for LDL is less than 100.  "Good" cholesterol (HDL cholesterol). This type helps to protect against heart disease by cleaning the arteries and carrying the LDL away. The desired level for HDL is 60 or higher.   Triglycerides. These are fats that the body can store or burn for energy. The desired number for triglycerides is lower than 150.  Total cholesterol. This is a measure of the total amount of cholesterol in your blood, including LDL cholesterol, HDL cholesterol, and triglycerides. A healthy number is less than 200. How is this treated? This condition is treated with diet changes, lifestyle changes, and medicines. Diet changes  This may include eating more whole grains, fruits, vegetables, nuts, and fish.  This may also include cutting back on red meat and foods that have a lot of added sugar. Lifestyle changes  Changes may include getting at least 40 minutes of aerobic exercise 3 times a week. Aerobic exercises include walking, biking, and swimming. Aerobic exercise along with a healthy diet can help you maintain a healthy weight.  Changes may also include quitting smoking. Medicines  Medicines are usually given if diet and lifestyle changes have failed to reduce your cholesterol to healthy levels.  Your health care provider may prescribe a statin medicine. Statin medicines have been shown to reduce cholesterol, which can reduce the risk of heart disease. Follow these instructions at home: Eating and drinking If told by your health care provider:  Eat chicken (without skin), fish, veal, shellfish, ground [place] breast, and round or loin cuts of red meat.  Do not eat fried foods or fatty meats, such as hot dogs and salami.  Eat plenty of fruits, such as apples.  Eat plenty of vegetables, such as broccoli, potatoes, and carrots.  Eat beans, peas, and lentils.  Eat grains such as barley, rice, couscous, and bulgur wheat.  Eat pasta without cream sauces.  Use skim or nonfat milk, and eat low-fat or nonfat yogurt and cheeses.  Do not eat or drink whole milk, cream, ice cream, egg yolks, or hard cheeses.  Do not eat stick margarine or tub margarines that contain trans fats (also  called partially hydrogenated oils).  Do not eat saturated tropical oils, such as coconut oil and palm oil.  Do not eat cakes, cookies, crackers, or other baked goods that contain trans fats.  General instructions  Exercise as directed by your health care provider. Increase your activity level with activities such as gardening, walking, and taking the stairs.  Take over-the-counter and prescription medicines only as told by your health care provider.  Do not use any products that contain nicotine or tobacco, such as cigarettes and e-cigarettes. If you need help quitting, ask your health care provider.  Keep all follow-up visits as told by your health care provider. This is important. Contact a health care provider if:  You are struggling to maintain a healthy diet or weight.  You need help to start on an exercise program.  You need help to stop smoking. Get help right away if:  You have chest pain.  You have trouble breathing. This information is not intended to replace advice given to you by your health care provider. Make sure you discuss any questions you have with your health care provider. Document Released: 12/13/2005 Document Revised: 12/16/2017 Document Reviewed: 06/12/2016 Elsevier Patient Education  2020 [employer].

## 2019-11-15 NOTE — Progress Notes (Signed)
Patient ID: Susan Holmes                 DOB: 02/10/1956                    MRN: [id number]     HPI: Susan Holmes is a 63 y.o. female patient of DR Oval Linsey referred to lipid clinic by Kerin Ransom PA-C. PMH is significant for diabetes, hypertension, orthostatic hypotension, PVD/PAD s/p fem-pop, liver hemangioma, hyperparathyroidism,hx of CVA, and depression.  Noted development of severe muscle pain with atorvastatin in the past.   Patient presents today for lipid management, potential PCSK9i initiation and smoking cessation.  She is very poor historian and in not completely clear why she is seen the pharmacist today. Denies taking rosuvastatin on profile or ever stating therapy. Unable to remember any other statin therapy other than atorvastatin in the past.  Current Medications: none  Intolerances:  lipitor (atorvastatin) - muscle   LDL goal: < 70% or 50% reduction from baseline  Diet: balance diet, improved in the last few months  Exercise: currently on  PT  Family History: unknown  Social History: current smoker (willing to quit), denies alcohol use  Labs: Oct/2019: CHO 206, TG 100, HDL 53, LDL-c 133  Past Medical History:  Diagnosis Date  . Anxiety   . Arthritis   . Chest pain 09/26/2018  . Congenital blindness    Left  eye  . Coronary artery disease    per cardiac cath 2015-- mild disease involving LAD and branches ,  LCFx and branches, and mild disease pRCA  . Depression   . Diabetes mellitus, type II ([place])    followed by pcp  . Glaucoma   . Glaucoma, both eyes   . Hepatitis B infection pt unsure   resolved  . History of kidney stones    right stone  . History of transient ischemic attack (TIA) 2017   per pt no residuals  . HTN (hypertension)    followed by pcp  . Hyperlipidemia   . PAD (peripheral artery disease) ([place])   . Poor dental hygiene   . Stroke ([place])   . Tobacco abuse    .5 PPD smoker    Current Outpatient Medications on File Prior to Visit   Medication Sig Dispense Refill  . Accu-Chek FastClix Lancets MISC CHECK BLOOD SUGAR THREE TIMES DAILY AS DIRECTED. DIAG CODE E 11.65. INSULIN DEPENDENT 306 each 1  . amLODipine (NORVASC) 10 MG tablet Take 0.5 tablets (5 mg total) by mouth every morning. (Patient taking differently: Take 5 mg by mouth daily. ) 45 tablet 2  . aspirin EC 81 MG tablet Take 81 mg by mouth daily.    . Blood Glucose Monitoring Suppl (ACCU-CHEK GUIDE) w/Device KIT 1 each by Does not apply route 3 (three) times daily. 1 kit 0  . budesonide-formoterol (SYMBICORT) 160-4.5 MCG/ACT inhaler Inhale 2 puffs into the lungs 2 (two) times daily as needed. (Patient taking differently: Inhale 2 puffs into the lungs 2 (two) times daily as needed (respiratory issues.). ) 2 Inhaler 0  . carvedilol (COREG) 12.5 MG tablet TAKE 1 TABLET(12.5 MG) BY MOUTH TWICE DAILY (Patient taking differently: Take 12.5 mg by mouth 2 (two) times daily. ) 60 tablet 6  . chlorthalidone (HYGROTON) 25 MG tablet TAKE 1/2 TABLET BY MOUTH DAILY (Patient taking differently: Take 12.5 mg by mouth daily. ) 45 tablet 3  . cyproheptadine (PERIACTIN) 4 MG tablet Take 4 mg by mouth 2 (two)  times daily.    . dorzolamide-timolol (COSOPT) 22.3-6.8 MG/ML ophthalmic solution Place 1 drop into both eyes every evening.     . Dulaglutide (TRULICITY) 1.5 CH/8.8FO SOPN Inject 1.5 mg into the skin every Saturday. 2 mL 3  . DULoxetine (CYMBALTA) 30 MG capsule Take 30 mg by mouth daily.     . fluticasone (FLONASE) 50 MCG/ACT nasal spray Place 1 spray into both nostrils daily. (Patient taking differently: Place 1 spray into both nostrils daily as needed for allergies. ) 9.9 mL 2  . hydrOXYzine (ATARAX/VISTARIL) 25 MG tablet Take 25 mg by mouth 3 (three) times daily as needed for anxiety.    . Insulin Glargine (LANTUS SOLOSTAR) 100 UNIT/ML Solostar Pen Inject 28 Units into the skin at bedtime. 5 pen 5  . insulin lispro (HUMALOG KWIKPEN) 100 UNIT/ML KwikPen Inject 0.05 mLs (5 Units  total) into the skin 3 (three) times daily with meals. 15 mL 11  . Insulin Pen Needle (VALUMARK PEN NEEDLES) 31G X 8 MM MISC 1 each by Does not apply route daily. 100 each 3  . losartan (COZAAR) 100 MG tablet Take 1 tablet (100 mg total) by mouth daily. 90 tablet 1  . mirtazapine (REMERON) 45 MG tablet Take 45 mg by mouth at bedtime.    . Multiple Vitamin (MULTIVITAMIN WITH MINERALS) TABS tablet Take 1 tablet by mouth daily.    Glory Rosebush ULTRA test strip USE TO TEST BLOOD GLUCOSE THREE TIMES DAILY 100 strip 12  . prednisoLONE acetate (PRED FORTE) 1 % ophthalmic suspension Place 1 drop into the right eye daily.    . traZODone (DESYREL) 100 MG tablet Take 300 mg by mouth at bedtime.     Marland Kitchen oxyCODONE-acetaminophen (PERCOCET/ROXICET) 5-325 MG tablet Take 1 tablet by mouth every 6 (six) hours as needed for severe pain. (Patient not taking: Reported on 11/15/2019) 25 tablet 0   No current facility-administered medications on file prior to visit.     Allergies  Allergen Reactions  . Ace Inhibitors Cough  . Lipitor [Atorvastatin] Other (See Comments)    MYALGIA   . Chantix [Varenicline] Nausea And Vomiting    Hyperlipidemia associated with type 2 diabetes mellitus ([place]) LDL above goal for secondary prevention. She is currently undergoing diabetes management with PCP, and is intolerant to atorvastatin. Patient was unable to start rosuvastatin as prescribed for unknown reasons.  Will re-order rosuvastatin 80m daily, repeat fasting lipid panel in 3 months and re-assess as needed.   TOBACCO ABUSE Patient willing to quit smoking. Noted previous nausea while taking chantix 133m Will resume therapy at 0.48m51mwice daily for 1 to 3 months until patient able to quit smoking. Will consider nicotine patches is patient is unable to tolerate Chantix low dose as well.     [name] PharmD, BCPS, CPPFullerton0[id number]/29/2020 4:01  PM

## 2019-11-21 DIAGNOSIS — H401122 Primary open-angle glaucoma, left eye, moderate stage: Secondary | ICD-10-CM | POA: Diagnosis not present

## 2019-11-21 DIAGNOSIS — H401113 Primary open-angle glaucoma, right eye, severe stage: Secondary | ICD-10-CM | POA: Diagnosis not present

## 2019-11-21 DIAGNOSIS — Z961 Presence of intraocular lens: Secondary | ICD-10-CM | POA: Diagnosis not present

## 2019-11-21 LAB — HM DIABETES EYE EXAM

## 2019-11-25 ENCOUNTER — Encounter: Payer: Self-pay | Admitting: Pharmacist

## 2019-11-25 NOTE — Assessment & Plan Note (Signed)
LDL above goal for secondary prevention. She is currently undergoing diabetes management with PCP, and is intolerant to atorvastatin. Patient was unable to start rosuvastatin as prescribed for unknown reasons.  Will re-order rosuvastatin 20mg  daily, repeat fasting lipid panel in 3 months and re-assess as needed.

## 2019-11-25 NOTE — Assessment & Plan Note (Signed)
Patient willing to quit smoking. Noted previous nausea while taking chantix 1mg . Will resume therapy at 0.5mg  twice daily for 1 to 3 months until patient able to quit smoking. Will consider nicotine patches is patient is unable to tolerate Chantix low dose as well.

## 2019-11-28 ENCOUNTER — Other Ambulatory Visit: Payer: Self-pay

## 2019-11-28 ENCOUNTER — Ambulatory Visit (INDEPENDENT_AMBULATORY_CARE_PROVIDER_SITE_OTHER): Payer: Self-pay | Admitting: Family

## 2019-11-28 ENCOUNTER — Encounter: Payer: Self-pay | Admitting: Family

## 2019-11-28 VITALS — BP 128/72 | HR 75 | Temp 98.0°F | Resp 12 | Ht 67.0 in | Wt 155.9 lb

## 2019-11-28 DIAGNOSIS — I779 Disorder of arteries and arterioles, unspecified: Secondary | ICD-10-CM

## 2019-11-28 DIAGNOSIS — E1165 Type 2 diabetes mellitus with hyperglycemia: Secondary | ICD-10-CM

## 2019-11-28 DIAGNOSIS — IMO0002 Reserved for concepts with insufficient information to code with codable children: Secondary | ICD-10-CM

## 2019-11-28 DIAGNOSIS — F17200 Nicotine dependence, unspecified, uncomplicated: Secondary | ICD-10-CM

## 2019-11-28 DIAGNOSIS — E1151 Type 2 diabetes mellitus with diabetic peripheral angiopathy without gangrene: Secondary | ICD-10-CM

## 2019-11-28 NOTE — Progress Notes (Signed)
[facility] [place]   CC: Follow up left groin wound check s/p left femoral to above-the-knee popliteal bypass with vein, peripheral artery occlusive disease  History of Present Illness Susan Holmes is a 63 y.o. female who is s/p left femoral to above-the-knee popliteal bypass with vein by Dr. Oneida Alar on 10/22/2019.    Pt was last evaluated on 11-08-19 by M. Eveland PA-C. At that time patent bypass with palpable left DP pulse Some skin layer dehiscence of left groin incision about 1 cm of separation however wound bed appears healthy and clean; pt was encouraged to cleanse with soap and water twice daily and pat dry Pt was encouraged to elevate her leg when she is not walking during the day to help with edema #25 5/325 mg Percocet prescribed for ongoing postop pain Pt was to follow-up in a couple weeks to recheck left groin incision.  Serum creatinine was 0.87 on 10-23-19 (review of records).   Pt states that ibuprofen upsets her stomach.   The patient is having pain at incision sites especially left groin.  She denies any tissue loss left foot.  She is also complaining of edema of left lower extremity.  She is taking an aspirin and statin daily.  Pt reports constipation, states she has run out of the oxycodone.  She reports pain in left leg at night, but little or no pain with walking.  She denies fever or chills.   Diabetic: Yes, 10.2 A1C on 10-10-19 (review of records), uncontrolled, pt states her blood sugars have improved since then Tobacco use: smoker  (1/2 ppd x 39 yrs)  Pt meds include: Statin :Yes Betablocker: Yes ASA: Yes Other anticoagulants/antiplatelets: no  Past Medical History:  Diagnosis Date  . Anxiety   . Arthritis   . Chest pain 09/26/2018  . Congenital blindness    Left  eye  . Coronary artery disease    per cardiac cath 2015-- mild disease involving LAD and branches ,  LCFx and branches, and mild disease pRCA  . Depression   .  Diabetes mellitus, type II ([place])    followed by pcp  . Glaucoma   . Glaucoma, both eyes   . Hepatitis B infection pt unsure   resolved  . History of kidney stones    right stone  . History of transient ischemic attack (TIA) 2017   per pt no residuals  . HTN (hypertension)    followed by pcp  . Hyperlipidemia   . PAD (peripheral artery disease) ([place])   . Poor dental hygiene   . Stroke ([place])   . Tobacco abuse    .5 PPD smoker    Social History Social History   Tobacco Use  . Smoking status: Current Every Day Smoker    Packs/day: 0.50    Years: 39.00    Pack years: 19.50    Types: Cigarettes    Start date: 20  . Smokeless tobacco: Current User    Types: Chew  . Tobacco comment: half pack per day per patient report 10/25/2019  Substance Use Topics  . Alcohol use: No    Alcohol/week: 0.0 standard drinks  . Drug use: No    Family History Family History  Problem Relation Age of Onset  . Heart disease Mother 69  . Hypertension Mother   . CVA Mother   . Heart disease Father 56  . CVA Sister   . CVA Brother   . Aneurysm Brother   . Aneurysm Sister   .  CVA Maternal Grandmother   . CVA Maternal Grandfather   . Colon cancer Neg Hx   . Esophageal cancer Neg Hx   . Stomach cancer Neg Hx     Past Surgical History:  Procedure Laterality Date  . ABDOMINAL AORTOGRAM W/LOWER EXTREMITY N/A 09/14/2019   Procedure: ABDOMINAL AORTOGRAM W/LOWER EXTREMITY;  Surgeon: Elam Dutch, MD;  Location: [facility];  Service: Cardiovascular;  Laterality: N/A;  . ABDOMINAL HYSTERECTOMY  1980s   unsure if ovaries were taken  . BYPASS GRAFT Left 10/22/2019    Left femoral to above-knee popliteal bypass using reversed ipsilateral greater saphenous vein  . colonoscopy    . FEMORAL-POPLITEAL BYPASS GRAFT Left 10/22/2019   Procedure: LEFT FEMORAL-ABOVE KNEE POPLITEAL  BYPASS GRAFT with REVERSED GREATER SAPHENOUS VEIN;  Surgeon: Elam Dutch, MD;  Location: [facility];  Service:  Vascular;  Laterality: Left;  . GLAUCOMA SURGERY Right 2017  . IR URETERAL STENT RIGHT NEW ACCESS W/O SEP NEPHROSTOMY CATH  10/03/2018  . LEFT HEART CATHETERIZATION WITH CORONARY ANGIOGRAM N/A 10/09/2014   Procedure: LEFT HEART CATHETERIZATION WITH CORONARY ANGIOGRAM;  Surgeon: Troy Sine, MD;  [facility];  Service: Cardiovascular;  Laterality: N/A;  . NEPHROLITHOTOMY Right 10/03/2018   Procedure: RIGHT NEPHROLITHOTOMY PERCUTANEOUS FIRST STAGE;  Surgeon: Irine Seal, MD;  Location: [facility];  Service: Urology;  Laterality: Right;  . PERIPHERAL VASCULAR INTERVENTION Left 09/14/2019   Procedure: PERIPHERAL VASCULAR INTERVENTION;  Surgeon: Elam Dutch, MD;  Location: [facility];  Service: Cardiovascular;  Laterality: Left;   left external iliac stent   . TEE WITHOUT CARDIOVERSION N/A 08/17/2016   Procedure: TRANSESOPHAGEAL ECHOCARDIOGRAM (TEE);  Surgeon: Sueanne Margarita, MD;  Location: [facility] ENDOSCOPY;  Service: Cardiovascular;  Laterality: N/A;    Allergies  Allergen Reactions  . Ace Inhibitors Cough  . Lipitor [Atorvastatin] Other (See Comments)    MYALGIA   . Chantix [Varenicline] Nausea And Vomiting    Current Outpatient Medications  Medication Sig Dispense Refill  . Accu-Chek FastClix Lancets MISC CHECK BLOOD SUGAR THREE TIMES DAILY AS DIRECTED. DIAG CODE E 11.65. INSULIN DEPENDENT 306 each 1  . amLODipine (NORVASC) 10 MG tablet Take 0.5 tablets (5 mg total) by mouth every morning. (Patient taking differently: Take 5 mg by mouth daily. ) 45 tablet 2  . aspirin EC 81 MG tablet Take 81 mg by mouth daily.    . Blood Glucose Monitoring Suppl (ACCU-CHEK GUIDE) w/Device KIT 1 each by Does not apply route 3 (three) times daily. 1 kit 0  . budesonide-formoterol (SYMBICORT) 160-4.5 MCG/ACT inhaler Inhale 2 puffs into the lungs 2 (two) times daily as needed. (Patient taking differently: Inhale 2 puffs into the lungs 2 (two) times daily as needed (respiratory issues.). ) 2  Inhaler 0  . carvedilol (COREG) 12.5 MG tablet TAKE 1 TABLET(12.5 MG) BY MOUTH TWICE DAILY (Patient taking differently: Take 12.5 mg by mouth 2 (two) times daily. ) 60 tablet 6  . chlorthalidone (HYGROTON) 25 MG tablet TAKE 1/2 TABLET BY MOUTH DAILY (Patient taking differently: Take 12.5 mg by mouth daily. ) 45 tablet 3  . cyproheptadine (PERIACTIN) 4 MG tablet Take 4 mg by mouth 2 (two) times daily.    . dorzolamide-timolol (COSOPT) 22.3-6.8 MG/ML ophthalmic solution Place 1 drop into both eyes every evening.     . Dulaglutide (TRULICITY) 1.5 LA/4.5XM SOPN Inject 1.5 mg into the skin every Saturday. 2 mL 3  . DULoxetine (CYMBALTA) 30 MG capsule Take 30 mg  by mouth daily.     . fluticasone (FLONASE) 50 MCG/ACT nasal spray Place 1 spray into both nostrils daily. (Patient taking differently: Place 1 spray into both nostrils daily as needed for allergies. ) 9.9 mL 2  . hydrOXYzine (ATARAX/VISTARIL) 25 MG tablet Take 25 mg by mouth 3 (three) times daily as needed for anxiety.    . Insulin Glargine (LANTUS SOLOSTAR) 100 UNIT/ML Solostar Pen Inject 28 Units into the skin at bedtime. 5 pen 5  . insulin lispro (HUMALOG KWIKPEN) 100 UNIT/ML KwikPen Inject 0.05 mLs (5 Units total) into the skin 3 (three) times daily with meals. 15 mL 11  . Insulin Pen Needle (VALUMARK PEN NEEDLES) 31G X 8 MM MISC 1 each by Does not apply route daily. 100 each 3  . losartan (COZAAR) 100 MG tablet Take 1 tablet (100 mg total) by mouth daily. 90 tablet 1  . mirtazapine (REMERON) 45 MG tablet Take 45 mg by mouth at bedtime.    . Multiple Vitamin (MULTIVITAMIN WITH MINERALS) TABS tablet Take 1 tablet by mouth daily.    Glory Rosebush ULTRA test strip USE TO TEST BLOOD GLUCOSE THREE TIMES DAILY 100 strip 12  . oxyCODONE-acetaminophen (PERCOCET/ROXICET) 5-325 MG tablet Take 1 tablet by mouth every 6 (six) hours as needed for severe pain. 25 tablet 0  . prednisoLONE acetate (PRED FORTE) 1 % ophthalmic suspension Place 1 drop into the  right eye daily.    . rosuvastatin (CRESTOR) 20 MG tablet Take 1 tablet (20 mg total) by mouth at bedtime. 30 tablet 6  . traZODone (DESYREL) 100 MG tablet Take 300 mg by mouth at bedtime.     . varenicline (CHANTIX) 0.5 MG tablet Take 1 tablet (0.5 mg total) by mouth 2 (two) times daily. 60 tablet 0   No current facility-administered medications for this visit.     ROS: See HPI for pertinent positives and negatives.   Physical Examination  Vitals:   11/28/19 1019  BP: 128/72  Pulse: 75  Resp: 12  Temp: 98 F (36.7 C)  TempSrc: Temporal  SpO2: 97%  Weight: 155 lb 14.4 oz (70.7 kg)  Height: '5\' 7"'  (1.702 m)   Body mass index is 24.42 kg/m.  General: A&O x 3, WDWN, female in NAD. Gait: normal HEENT: No gross abnormalities.  Pulmonary: Respirations are non labored, CTAB, fair air movement in all fields Cardiac: regular rhythm, no detected murmur.         Carotid Bruits Right Left   Negative Negative   Radial pulses are 2+ palpable bilaterally   Adominal aortic pulse is not palpable                         VASCULAR EXAM: Extremities without ischemic changes, without Gangrene; with open shallow small wound left groin, +signs of granulation, no erythema, no drainage, healthy appearing wound bed.  Mild swelling at left medial thigh incisions which have healed well, no signs of infection.  LE Pulses Right Left       FEMORAL  1+ palpable  2+ palpable        POPLITEAL  not palpable   not palpable       POSTERIOR TIBIAL  not palpable   not palpable        DORSALIS PEDIS      ANTERIOR TIBIAL 2+ palpable  2+ palpable    Abdomen: soft, NT, no palpable masses. Skin: no rashes, no cellulitis, no ulcers noted. Musculoskeletal: no muscle wasting or atrophy.  Neurologic: A&O X 3; appropriate affect, Sensation is normal; MOTOR FUNCTION:  moving all extremities equally,  motor strength 5/5 throughout. Speech is fluent/normal. CN 2-12 intact. Psychiatric: Thought content is normal, mood appropriate for clinical situation.    DATA  ABI (Date: 11-08-19): ABI Findings: +---------+------------------+-----+---------+--------+ Right    Rt Pressure (mmHg)IndexWaveform Comment  +---------+------------------+-----+---------+--------+ Brachial 130                    triphasic         +---------+------------------+-----+---------+--------+ PTA      132               0.97 triphasic         +---------+------------------+-----+---------+--------+ DP       124               0.91 triphasic         +---------+------------------+-----+---------+--------+ Great Toe152               1.12 Normal            +---------+------------------+-----+---------+--------+  +---------+------------------+-----+---------+-------+ Left     Lt Pressure (mmHg)IndexWaveform Comment +---------+------------------+-----+---------+-------+ Brachial 136                    triphasic        +---------+------------------+-----+---------+-------+ PTA      132               0.97 triphasic        +---------+------------------+-----+---------+-------+ DP       132               0.97 triphasic        +---------+------------------+-----+---------+-------+ Cleotis Nipper               0.93 Normal           +---------+------------------+-----+---------+-------+  +-------+-----------+-----------+------------+------------+ ABI/TBIToday's ABIToday's TBIPrevious ABIPrevious TBI +-------+-----------+-----------+------------+------------+ Right  0.97       1.12       0.99        1.00         +-------+-----------+-----------+------------+------------+ Left   0.97       0.93       0.68        0.44         +-------+-----------+-----------+------------+------------+ Right ABIs and TBIs appear essentially unchanged compared to prior  study on 09/06/2019. Left ABIs and TBIs appear increased compared to prior study on 09/06/2019.   Summary: Right: Resting right ankle-brachial index is within normal range. No evidence of significant right lower extremity arterial disease. The right toe-brachial index is normal. RT great toe pressure = 152 mmHg.  Left: Resting left ankle-brachial index is within normal range. No evidence of significant left lower extremity arterial disease. The left toe-brachial index is normal. LT Great toe pressure = 127 mmHg.   ASSESSMENT: Susan Holmes is a 63 y.o. female who is s/p left femoral to above-the-knee popliteal bypass with  vein by Dr. Oneida Alar on 10/22/2019.    Bilateral DP pulses are 2+ palpable.  No signs of ischemia in her left foot or lower leg.  Shallow healing left groin wound with signs of granulation, healthy appearing tissue at wound bed.   Her atherosclerotic risk factors include current smoker (39 years), uncontrolled DM, CAD, and hypertension.  Over 3 minutes was spent counseling patient re smoking cessation, and patient was given several free resources re smoking cessation.  She takes a daily statin and ASA.  She c/o constipation, most likely secondary to oxycodone use. She states that she is taking a stool softener; I advised her to take Miralax as directed on the package, she states that she has some.  She c/o pain in her right groin and thigh at night, not as much with walking. Her ABI's on 11-08-19 were normal bilaterally.  For pain control measures I advised 800 mg OTC ibuprofen every 6 hours alternating with 1000 mg OTC acetaminophen every 8 hours. Pt states both upset her stomach; I advised to take both with food.  She has normal renal function as of 10-23-19 (serum creatinine was 0.87).   She is already showering daily; I advised her to use liquid Dial soap to shower daily, pat dry left groin wound, and apply 2x2 gauze to protect from her panty edge abrading the wound.    Elevate her left leg when not walking to minimize edema.    PLAN:  Follow up in 2 weeks for right groin wound recheck. I advised pt to notify us if she develops fever or chills, or if her left groin wound seems worse.  I discussed in depth with the patient the nature of atherosclerosis, and emphasized the importance of maximal medical management including strict control of blood pressure, blood glucose, and lipid levels, obtaining regular exercise, and cessation of smoking.  The patient is aware that without maximal medical management the underlying atherosclerotic disease process will progress, limiting the benefit of any interventions.  The patient was given information about PAD including signs, symptoms, treatment, what symptoms should prompt the patient to seek immediate medical care, and risk reduction measures to take.  Clemon Chambers, RN, MSN, FNP-C [facility] Phone: [phone]  Clinic MD: Laqueta Due  11/28/19 10:24 AM

## 2019-11-28 NOTE — Patient Instructions (Addendum)
Steps to Quit Smoking Smoking tobacco is the leading cause of preventable death. It can affect almost every organ in the body. Smoking puts you and people around you at risk for many serious, long-lasting (chronic) diseases. Quitting smoking can be hard, but it is one of the best things that you can do for your health. It is never too late to quit. How do I get ready to quit? When you decide to quit smoking, make a plan to help you succeed. Before you quit:  Pick a date to quit. Set a date within the next 2 weeks to give you time to prepare.  Write down the reasons why you are quitting. Keep this list in places where you will see it often.  Tell your family, friends, and co-workers that you are quitting. Their support is important.  Talk with your doctor about the choices that may help you quit.  Find out if your health insurance will pay for these treatments.  Know the people, places, things, and activities that make you want to smoke (triggers). Avoid them. What first steps can I take to quit smoking?  Throw away all cigarettes at home, at work, and in your car.  Throw away the things that you use when you smoke, such as ashtrays and lighters.  Clean your car. Make sure to empty the ashtray.  Clean your home, including curtains and carpets. What can I do to help me quit smoking? Talk with your doctor about taking medicines and seeing a counselor at the same time. You are more likely to succeed when you do both.  If you are pregnant or breastfeeding, talk with your doctor about counseling or other ways to quit smoking. Do not take medicine to help you quit smoking unless your doctor tells you to do so. To quit smoking: Quit right away  Quit smoking totally, instead of slowly cutting back on how much you smoke over a period of time.  Go to counseling. You are more likely to quit if you go to counseling sessions regularly. Take medicine You may take medicines to help you quit. Some  medicines need a prescription, and some you can buy over-the-counter. Some medicines may contain a drug called nicotine to replace the nicotine in cigarettes. Medicines may:  Help you to stop having the desire to smoke (cravings).  Help to stop the problems that come when you stop smoking (withdrawal symptoms). Your doctor may ask you to use:  Nicotine patches, gum, or lozenges.  Nicotine inhalers or sprays.  Non-nicotine medicine that is taken by mouth. Find resources Find resources and other ways to help you quit smoking and remain smoke-free after you quit. These resources are most helpful when you use them often. They include:  Online chats with a counselor.  Phone quitlines.  Printed self-help materials.  Support groups or group counseling.  Text messaging programs.  Mobile phone apps. Use apps on your mobile phone or tablet that can help you stick to your quit plan. There are many free apps for mobile phones and tablets as well as websites. Examples include Quit Guide from the CDC and smokefree.gov  What things can I do to make it easier to quit?   Talk to your family and friends. Ask them to support and encourage you.  Call a phone quitline (1-800-QUIT-NOW), reach out to support groups, or work with a counselor.  Ask people who smoke to not smoke around you.  Avoid places that make you want to smoke,   such as: ? Bars. ? Parties. ? Smoke-break areas at work.  Spend time with people who do not smoke.  Lower the stress in your life. Stress can make you want to smoke. Try these things to help your stress: ? Getting regular exercise. ? Doing deep-breathing exercises. ? Doing yoga. ? Meditating. ? Doing a body scan. To do this, close your eyes, focus on one area of your body at a time from head to toe. Notice which parts of your body are tense. Try to relax the muscles in those areas. How will I feel when I quit smoking? Day 1 to 3 weeks Within the first 24 hours,  you may start to have some problems that come from quitting tobacco. These problems are very bad 2-3 days after you quit, but they do not often last for more than 2-3 weeks. You may get these symptoms:  Mood swings.  Feeling restless, nervous, angry, or annoyed.  Trouble concentrating.  Dizziness.  Strong desire for high-sugar foods and nicotine.  Weight gain.  Trouble pooping (constipation).  Feeling like you may vomit (nausea).  Coughing or a sore throat.  Changes in how the medicines that you take for other issues work in your body.  Depression.  Trouble sleeping (insomnia). Week 3 and afterward After the first 2-3 weeks of quitting, you may start to notice more positive results, such as:  Better sense of smell and taste.  Less coughing and sore throat.  Slower heart rate.  Lower blood pressure.  Clearer skin.  Better breathing.  Fewer sick days. Quitting smoking can be hard. Do not give up if you fail the first time. Some people need to try a few times before they succeed. Do your best to stick to your quit plan, and talk with your doctor if you have any questions or concerns. Summary  Smoking tobacco is the leading cause of preventable death. Quitting smoking can be hard, but it is one of the best things that you can do for your health.  When you decide to quit smoking, make a plan to help you succeed.  Quit smoking right away, not slowly over a period of time.  When you start quitting, seek help from your doctor, family, or friends. This information is not intended to replace advice given to you by your health care provider. Make sure you discuss any questions you have with your health care provider. Document Released: 10/09/2009 Document Revised: 03/02/2019 Document Reviewed: 03/03/2019 Elsevier Patient Education  [place].     Peripheral Vascular Disease  Peripheral vascular disease (PVD) is a disease of the blood vessels that are not part  of your heart and brain. A simple term for PVD is poor circulation. In most cases, PVD narrows the blood vessels that carry blood from your heart to the rest of your body. This can reduce the supply of blood to your arms, legs, and internal organs, like your stomach or kidneys. However, PVD most often affects a person's lower legs and feet. Without treatment, PVD tends to get worse. PVD can also lead to acute ischemic limb. This is when an arm or leg suddenly cannot get enough blood. This is a medical emergency. Follow these instructions at home: Lifestyle  Do not use any products that contain nicotine or tobacco, such as cigarettes and e-cigarettes. If you need help quitting, ask your doctor.  Lose weight if you are overweight. Or, stay at a healthy weight as told by your doctor.  Eat  a diet that is low in fat and cholesterol. If you need help, ask your doctor.  Exercise regularly. Ask your doctor for activities that are right for you. General instructions  Take over-the-counter and prescription medicines only as told by your doctor.  Take good care of your feet: ? Wear comfortable shoes that fit well. ? Check your feet often for any cuts or sores.  Keep all follow-up visits as told by your doctor This is important. Contact a doctor if:  You have cramps in your legs when you walk.  You have leg pain when you are at rest.  You have coldness in a leg or foot.  Your skin changes.  You are unable to get or have an erection (erectile dysfunction).  You have cuts or sores on your feet that do not heal. Get help right away if:  Your arm or leg turns cold, numb, and blue.  Your arms or legs become red, warm, swollen, painful, or numb.  You have chest pain.  You have trouble breathing.  You suddenly have weakness in your face, arm, or leg.  You become very confused or you cannot speak.  You suddenly have a very bad headache.  You suddenly cannot see. Summary  Peripheral  vascular disease (PVD) is a disease of the blood vessels.  A simple term for PVD is poor circulation. Without treatment, PVD tends to get worse.  Treatment may include exercise, low fat and low cholesterol diet, and quitting smoking. This information is not intended to replace advice given to you by your health care provider. Make sure you discuss any questions you have with your health care provider. Document Released: 03/09/2010 Document Revised: 11/25/2017 Document Reviewed: 01/20/2017 Elsevier Patient Education  [place].    Constipation, Adult Constipation is when a person:  Poops (has a bowel movement) fewer times in a week than normal.  Has a hard time pooping.  Has poop that is dry, hard, or bigger than normal. Follow these instructions at home: Eating and drinking   Eat foods that have a lot of fiber, such as: ? Fresh fruits and vegetables. ? Whole grains. ? Beans.  Eat less of foods that are high in fat, low in fiber, or overly processed, such as: ? [place] fries. ? Hamburgers. ? Cookies. ? Candy. ? Soda.  Drink enough fluid to keep your pee (urine) clear or pale yellow. General instructions  Exercise regularly or as told by your doctor.  Go to the restroom when you feel like you need to poop. Do not hold it in.  Take over-the-counter and prescription medicines only as told by your doctor. These include any fiber supplements.  Do pelvic floor retraining exercises, such as: ? Doing deep breathing while relaxing your lower belly (abdomen). ? Relaxing your pelvic floor while pooping.  Watch your condition for any changes.  Keep all follow-up visits as told by your doctor. This is important. Contact a doctor if:  You have pain that gets worse.  You have a fever.  You have not pooped for 4 days.  You throw up (vomit).  You are not hungry.  You lose weight.  You are bleeding from the anus.  You have thin, pencil-like poop (stool). Get  help right away if:  You have a fever, and your symptoms suddenly get worse.  You leak poop or have blood in your poop.  Your belly feels hard or bigger than normal (is bloated).  You have very bad belly  pain.  You feel dizzy or you faint. This information is not intended to replace advice given to you by your health care provider. Make sure you discuss any questions you have with your health care provider. Document Released: 05/31/2008 Document Revised: 11/25/2017 Document Reviewed: 06/02/2016 Elsevier Patient Education  2020 [employer].

## 2019-12-03 ENCOUNTER — Encounter: Payer: Self-pay | Admitting: *Deleted

## 2019-12-03 ENCOUNTER — Other Ambulatory Visit: Payer: Self-pay | Admitting: *Deleted

## 2019-12-03 NOTE — Patient Outreach (Addendum)
[facility]) [place] Telephone Outreach PCP office completes Transition of Care follow up post-hospital discharge Pos-hospital discharge day # 41 12/03/2019  Susan Holmes [birth year] [id number]  Successful telephone outreach to [name], 63 y/o female referred to [place] by [facility] Liaison after recent planned hospital visit October 26-27, 2020 for (L) femoral popliteal bypass due to PAD/ PVD.Patient was discharged home to self care without home health services.Patient has history including, but not limited to, PAD/PVD with claudication; HTN/ HLD; DM-II with insulin therapy; previous CVA; glaucoma; depression.  HIPAA/ identity verified during phone call today.  Patient reports that she has "been doing great, is happy and in good spirits," and she denies pain today; reports occasionally has pain at night in (L) groin surgical site; states "managed fairly well" with use of tylenol.  Patient denies clinical concerns, issues/ problems today and sounds to be in no distress throughout phone call today.  Patient further reports: -- attended recent vascular surgical provider office visit 12/02/20202; "got a good report;" states that her (L) groin wound "has completely healed."  Confirms elevating leg when resting.  Accurately reports follow up appointment next week, reports will continue to use established transportation services through medicaid  -- continues self- managing medications and denies concerns around medications- reports has not started taking newly/ recently prescribed stop smoking medication- states she has tried this medication before, and "is still thinking about" whether she wishes to try the medication again; medication list in EMR updated accordingly  -- has spoken to [place] around previously stated community resource needs for nutrition, and today asks that I have [facility] BSW contact her again to see if she is able to  obtain Meals on Wheels- will place another referral for [facility] CSW team  Self-health management ofPAD/ PVD and DM: --continues to struggle with efforts to decrease/ stop smoking; states that she "has still done better;" and has maintained previously reported smoking post- recent surgery "to about 6/ day."  She confirms that she received previously provided educational material around smoking cessation and "is still working on it," but adds that she plans to "really start my efforts at the beginning of the new year."  Patient confirms that she has not utilized previously provided smoking cessation resources; I encouraged her to utilize resources as she re-commits to planning a stop date  -- blood sugars "running better;" confirms that she received previously provided printed educational material around self-health management/ dietary strategies for decreasing blood sugars/ reviewed with patient:  we reviewed her recently recorded blood sugars at home and she reports "no low numbers," with ranges "between 100-200, but mostly around 150."  Continues monitoring blood sugars "once daily at night" and states this is how she "was told" to monitor blood sugars," rather than fasting in am.  Patient reports today that she has not been recording her blood sugars at home, and using teach back method, discussed with patient value/ significance of A1-C values in reflecting blood sugars at home over time; patient states she "will think about" beginning to record blood sugars, but "isn't going to make any promises;" denies questions around dietary strategies to lower blood sugars at home- patient states she "knows what she should be" doing, but "doesn't always do it."  Encouraged patient to make small changes over time for lasting effect  Patient denies further issues, concerns, or problems today. I confirmed that patient hasmy direct phone number, the main [facility] CM office phone  number, and the [facility] CM 24-hour nurse advice  phone number should issues arise prior to next scheduled [place] outreach.  Encouraged patient to contact me directly if needs, questions, issues, or concerns arise prior to next scheduled outreach; patient agreed to do so.  Plan:  Patient will take medications as prescribed and will attend all scheduled provider appointments  Patient will promptly notify care providers for any new concerns/ issues/ problems that arise  Patient willcontinue her efforts to decrease/ stop smoking  Patient will continue monitoring/ and begin recording dailyblood sugars and limiting carb/ sugar intake  I will make another [facility] CSW referral to address patient's request in obtaining Meals on [place] outreach to continue with scheduled phone callnext month  [facility] CM Care Plan Problem One     Most Recent Value  Care Plan Problem One  High risk for hospital readmission related to/ as evidenced by recent hospitalization for planned vascular surgery October 26-27, 2020 in patient with DM  Role Documenting the Problem One  Care Management Coordinator  Care Plan for Problem One  Not Active  [facility] Long Term Goal   Over the next 22 days, patient will not experience unplanned hospital re-admission, as evidenced by patient reporting and review of EMR during [place] Term Goal Start Date  11/12/19  [facility] Long Term Goal Met Date  12/03/19 [facility] Met]  Interventions for Problem One Long Term Goal  Confirmed that patient has not had recent hospital admission or ED visit,  confirmed that patient has no current clinical concerns,  discussed with patient reasons to seek urgent/ emergent care vs. contact care providers and encouraged patient to promptly notify care providers for any new concerns/ issues/ problems that arise  [facility] CM Short Term Goal #1   Over the next 22 days, patient will attend all scheduled provider appointments, as evidenced by patient reporting and collaboration  with care providers as indicated during [facility] RN CM outreach  [facility] CM Short Term Goal #1 Start Date  11/12/19  [facility] CM Short Term Goal #1 Met Date  12/03/19 [Goal Met]  Interventions for Short Term Goal #1  Reviewed recent and upcoming provider appointments with patient and confirmed that she has attended all and has accurate understanding of upcoming provider appointments,  confirmed that patient continues using established community transportation services  [facility] CM Short Term Goal #2   Over the next 22 days, patient will continue decreasing the amount of cigarettes she smokes daily, as evidenced by patient reporting during [facility] RN CM outreach  [facility] CM Short Term Goal #2 Start Date  11/12/19  [facility] CM Short Term Goal #2 Met Date  12/03/19 [Goal partially met]  Interventions for Short Term Goal #2  Confirmed that patient has not yet quit smoking and discussed her efforts to decrease the amount she smokes daily,  using teachback method, reviewed previously provided printed educational material around smoking cessation,  confirmed that patient has previously provided resources around smoking cessation- encouraged patient to promptly utilize these resources and to continue making effort to her stated goal to quit smoking    [facility] CM Care Plan Problem Two     Most Recent Value  Care Plan Problem Two  Self-health management of chronic disease state of DM, as evidenced by patient reporting  Role Documenting the Problem Two  Care Management Coordinator  Care Plan for Problem Two  Active  Interventions for Problem Two Long Term Goal  Discussed recent blood sugars at home and reviewed with patient and encouraged patient to continue daily monitoring of blood sugars at home  [facility] Long Term Goal  Over the next 31 days, patient will continue monitoring and begin recording daily blood sugars at home, as evidenced by patient reporting/ review of same during [place] outreach  [facility] Long Term Goal Start Date  12/03/19  [facility] CM  Short Term Goal #1   Over the next 22 days, patient will see a decrease in her daily blood sugar readings, as evidenced by patient reporting and review of same with patient during [facility] RN CM outreach  [facility] CM Short Term Goal #1 Start Date  11/12/19  [facility] CM Short Term Goal #1 Met Date   12/03/19 [Goal met]  Interventions for Short Term Goal #2   Confirmed that patient continues monitoring her blood sugars q HS,  discussed value of patient beginning to record blood sugars on paper,  reviewed recent blood sugars with patient who reports "better" recent readings at home  [facility] CM Short Term Goal #2   Over the next 30 days, patient will verbalize 3 dietary strategies to decrease blood sugars, as evidenced by patient reporting during [facility] RN CM outreach  [facility] CM Short Term Goal #2 Start Date  12/03/19  Interventions for Short Term Goal #2  Using teachback method, reviewed with patient recently provided printed educational material around dietary strategies to decrease blood sugars,  confirmed that patient has begun reviewing,  discussed with patient most recent A1-C and initiated conversation around how A1-C reflects daily blood sugars over time    [facility] CM Care Plan Problem Three     Most Recent Value  Care Plan Problem Three  Self-health management of chronic disease state of PAD/ PVD, in patient who continues to smoke, as evidenced by patient reporting  Role Documenting the Problem Three  Care Management Coordinator  Care Plan for Problem Three  Active  [facility] Long Term Goal   Over the next 60 days, patient will stop smoking as evidenced by patient reporting during [place] CM outreach  [facility] Long Term Goal Start Date  12/03/19  Interventions for Problem Three Long Term Goal  Confirmed that patient received previously mailed printed educational material around smoking cessation,  discussed patient's current smoking practices and use of recently provided prescription for pharmaceutical smoking cessation medication,   confirmed that patient has recently provided resources around smoking cessation and encouraged patient to utilize these resources as she plans to stop smoking     Oneta Rack, RN, BSN, [employer] Coordinator [facility] Care Management  [phone]

## 2019-12-07 ENCOUNTER — Other Ambulatory Visit: Payer: Self-pay

## 2019-12-07 ENCOUNTER — Ambulatory Visit (INDEPENDENT_AMBULATORY_CARE_PROVIDER_SITE_OTHER): Payer: Medicare Other | Admitting: Internal Medicine

## 2019-12-07 ENCOUNTER — Encounter: Payer: Self-pay | Admitting: Internal Medicine

## 2019-12-07 VITALS — BP 118/77 | HR 82 | Temp 98.9°F | Ht 67.0 in | Wt 155.2 lb

## 2019-12-07 DIAGNOSIS — E119 Type 2 diabetes mellitus without complications: Secondary | ICD-10-CM | POA: Diagnosis not present

## 2019-12-07 DIAGNOSIS — R634 Abnormal weight loss: Secondary | ICD-10-CM | POA: Diagnosis not present

## 2019-12-07 DIAGNOSIS — F17210 Nicotine dependence, cigarettes, uncomplicated: Secondary | ICD-10-CM

## 2019-12-07 DIAGNOSIS — Z794 Long term (current) use of insulin: Secondary | ICD-10-CM

## 2019-12-07 DIAGNOSIS — R109 Unspecified abdominal pain: Secondary | ICD-10-CM

## 2019-12-07 DIAGNOSIS — E210 Primary hyperparathyroidism: Secondary | ICD-10-CM

## 2019-12-07 DIAGNOSIS — R197 Diarrhea, unspecified: Secondary | ICD-10-CM

## 2019-12-07 DIAGNOSIS — Z6824 Body mass index (BMI) 24.0-24.9, adult: Secondary | ICD-10-CM

## 2019-12-07 NOTE — Progress Notes (Signed)
   CC: Unintentional weight loss  HPI:  [name] is a 63 y.o. year-old female with PMH listed below who presents to clinic for unintentional weight loss. Please see problem based assessment and plan for further details.   Past Medical History:  Diagnosis Date  . Anxiety   . Arthritis   . Chest pain 09/26/2018  . Congenital blindness    Left  eye  . Coronary artery disease    per cardiac cath 2015-- mild disease involving LAD and branches ,  LCFx and branches, and mild disease pRCA  . Depression   . Diabetes mellitus, type II ([place])    followed by pcp  . Glaucoma   . Glaucoma, both eyes   . Hepatitis B infection pt unsure   resolved  . History of kidney stones    right stone  . History of transient ischemic attack (TIA) 2017   per pt no residuals  . HTN (hypertension)    followed by pcp  . Hyperlipidemia   . PAD (peripheral artery disease) ([place])   . Poor dental hygiene   . Stroke ([place])   . Tobacco abuse    .5 PPD smoker   Review of Systems:   Review of Systems  Constitutional: Positive for weight loss. Negative for chills, fever and malaise/fatigue.  Gastrointestinal: Positive for abdominal pain and diarrhea. Negative for nausea and vomiting.  Genitourinary: Negative for dysuria, frequency and urgency.  Musculoskeletal: Positive for joint pain.  Neurological: Negative for dizziness and headaches.    Physical Exam:  Vitals:   12/07/19 1002  BP: 118/77  Pulse: 82  Temp: 98.9 F (37.2 C)  TempSrc: Oral  SpO2: 98%  Weight: 155 lb 3.2 oz (70.4 kg)  Height: 5\' 7"  (1.702 m)    General: Cardiac: regular rate and rhythm, nl S1/S2, no murmurs, rubs or gallops Pulm: CTAB, no wheezes or crackles, no increased work of breathing on room air  Abd: soft, NTND, bowel sounds present Ext: warm and well perfused, no peripheral edema   Assessment & Plan:   See Encounters Tab for problem based charting.  Patient discussed with Dr. Dareen Piano

## 2019-12-07 NOTE — Patient Instructions (Signed)
Susan Holmes,   We did blood work to figure out what is causing your weight loss.  We will give you a call with the results.  I made a referral to the ENT doctors so that you can be evaluated for surgery to remove the parathyroid gland that is causing issues with your calcium levels.  We gave you coupons for boost and Glucerna so that he can purchase them at your local store.  Make a follow-up appointment with your regular doctor in 1 month to follow-up on weight loss.  You can also come in sooner if you need to.  - Dr. Frederico Hamman

## 2019-12-07 NOTE — Assessment & Plan Note (Addendum)
Patient presents complaining of 15 pound weight loss (170 --> 155). This is associated with poor appetite, low energy, abdominal pain, and constipation.  She has a history of primary hyperparathyroidism and was recommended to go to ENT for evaluation for surgery but has not been able to do so.  She also has insulin-dependent T2DM, but her BG has been well controlled on review of BG meter download.  Not due for an A1c today.  She denies infectious symptoms.  She is a current smoker, less than 1/2 ppd. She is up-to-date with age-appropriate cancer screening.  I suspect this is symptomatic primary hyperparathyroidism.  We will therefore refer for ENT for surgical evaluation.  Low suspicion for an infectious etiology and malignancy.  Work-up as below.  - ENT referral - CBC w/ diff, CMP, TSH, HIV, ESR, HepC,  - Provided coupons for meal supplements  - Follow up in 1 month with PCP 

## 2019-12-08 LAB — SEDIMENTATION RATE: Sed Rate: 78 mm/hr — ABNORMAL HIGH (ref 0–40)

## 2019-12-08 LAB — CBC WITH DIFFERENTIAL/PLATELET
Basophils Absolute: 0 10*3/uL (ref 0.0–0.2)
Basos: 1 %
EOS (ABSOLUTE): 0.3 10*3/uL (ref 0.0–0.4)
Eos: 5 %
Hematocrit: 38.8 % (ref 34.0–46.6)
Hemoglobin: 11.4 g/dL (ref 11.1–15.9)
Immature Grans (Abs): 0 10*3/uL (ref 0.0–0.1)
Immature Granulocytes: 0 %
Lymphocytes Absolute: 3.2 10*3/uL — ABNORMAL HIGH (ref 0.7–3.1)
Lymphs: 46 %
MCH: 22.5 pg — ABNORMAL LOW (ref 26.6–33.0)
MCHC: 29.4 g/dL — ABNORMAL LOW (ref 31.5–35.7)
MCV: 77 fL — ABNORMAL LOW (ref 79–97)
Monocytes Absolute: 0.5 10*3/uL (ref 0.1–0.9)
Monocytes: 8 %
Neutrophils Absolute: 2.8 10*3/uL (ref 1.4–7.0)
Neutrophils: 40 %
Platelets: 294 10*3/uL (ref 150–450)
RBC: 5.06 x10E6/uL (ref 3.77–5.28)
RDW: 15.4 % (ref 11.7–15.4)
WBC: 6.8 10*3/uL (ref 3.4–10.8)

## 2019-12-08 LAB — CMP14 + ANION GAP
ALT: 9 IU/L (ref 0–32)
AST: 13 IU/L (ref 0–40)
Albumin/Globulin Ratio: 1.7 (ref 1.2–2.2)
Albumin: 4.4 g/dL (ref 3.8–4.8)
Alkaline Phosphatase: 119 IU/L — ABNORMAL HIGH (ref 39–117)
Anion Gap: 17 mmol/L (ref 10.0–18.0)
BUN/Creatinine Ratio: 12 (ref 12–28)
BUN: 11 mg/dL (ref 8–27)
Bilirubin Total: <0.2 mg/dL (ref 0.0–1.2)
CO2: 23 mmol/L (ref 20–29)
Calcium: 10.7 mg/dL — ABNORMAL HIGH (ref 8.7–10.3)
Chloride: 102 mmol/L (ref 96–106)
Creatinine, Ser: 0.93 mg/dL (ref 0.57–1.00)
GFR calc Af Amer: 75 mL/min/{1.73_m2} (ref >59)
GFR calc non Af Amer: 65 mL/min/{1.73_m2} (ref >59)
Globulin, Total: 2.6 g/dL (ref 1.5–4.5)
Glucose: 112 mg/dL — ABNORMAL HIGH (ref 65–99)
Potassium: 4.3 mmol/L (ref 3.5–5.2)
Sodium: 142 mmol/L (ref 134–144)
Total Protein: 7 g/dL (ref 6.0–8.5)

## 2019-12-08 LAB — TSH: TSH: 0.62 u[IU]/mL (ref 0.450–4.500)

## 2019-12-08 LAB — HEPATITIS C ANTIBODY: Hep C Virus Ab: <0.1 s/co ratio (ref 0.0–0.9)

## 2019-12-08 LAB — HIV ANTIBODY (ROUTINE TESTING W REFLEX): HIV Screen 4th Generation wRfx: NONREACTIVE

## 2019-12-10 ENCOUNTER — Ambulatory Visit: Payer: Medicare Other | Admitting: Family

## 2019-12-10 NOTE — Progress Notes (Signed)
Internal Medicine Clinic Attending  Case discussed with Dr. Santos-Sanchez at the time of the visit.  We reviewed the resident's history and exam and pertinent patient test results.  I agree with the assessment, diagnosis, and plan of care documented in the resident's note.    

## 2019-12-17 ENCOUNTER — Other Ambulatory Visit: Payer: Self-pay

## 2019-12-17 ENCOUNTER — Telehealth: Payer: Self-pay | Admitting: Internal Medicine

## 2019-12-17 ENCOUNTER — Ambulatory Visit (INDEPENDENT_AMBULATORY_CARE_PROVIDER_SITE_OTHER): Payer: Medicare Other | Admitting: Internal Medicine

## 2019-12-17 DIAGNOSIS — M542 Cervicalgia: Secondary | ICD-10-CM | POA: Diagnosis not present

## 2019-12-17 MED ORDER — CELECOXIB 100 MG PO CAPS: 100.0000 mg | ORAL_CAPSULE | Freq: Two times a day (BID) | ORAL | 0 refills | Status: DC

## 2019-12-17 NOTE — Telephone Encounter (Signed)
Pt is requesting a callback from the nurse [phone]

## 2019-12-17 NOTE — Addendum Note (Signed)
Addended by: Ina Homes T on: 12/17/2019 01:48 PM   Modules accepted: Orders

## 2019-12-17 NOTE — Progress Notes (Signed)
Internal Medicine Clinic Attending  Case discussed with Dr. Helberg at the time of the visit.  We reviewed the resident's history and exam and pertinent patient test results.  I agree with the assessment, diagnosis, and plan of care documented in the resident's note.    

## 2019-12-17 NOTE — Progress Notes (Addendum)
   CC: Left shoulder, arm, and neck pain  This is a telephone encounter between Susan Holmes and Susan Holmes on 12/17/2019 for Left shoulder, arm, and neck pain. The visit was conducted with the patient located at home and [facility] at [facility]. The patient's identity was confirmed using their DOB and current address. The patient has consented to being evaluated through a telephone encounter and understands the associated risks (an examination cannot be done and the patient may need to come in for an appointment) / benefits (allows the patient to remain at home, decreasing exposure to coronavirus). I personally spent 13 minutes on medical discussion.   HPI:  [name] is a 63 y.o. with PMH as below.   Please see A&P for assessment of the patient's acute and chronic medical conditions.   Past Medical History:  Diagnosis Date  . Anxiety   . Arthritis   . Chest pain 09/26/2018  . Congenital blindness    Left  eye  . Coronary artery disease    per cardiac cath 2015-- mild disease involving LAD and branches ,  LCFx and branches, and mild disease pRCA  . Depression   . Diabetes mellitus, type II ([place])    followed by pcp  . Glaucoma   . Glaucoma, both eyes   . Hepatitis B infection pt unsure   resolved  . History of kidney stones    right stone  . History of transient ischemic attack (TIA) 2017   per pt no residuals  . HTN (hypertension)    followed by pcp  . Hyperlipidemia   . PAD (peripheral artery disease) ([place])   . Poor dental hygiene   . Stroke ([place])   . Tobacco abuse    .5 PPD smoker   Review of Systems:  Performed and all others negative.  Assessment & Plan:   See Encounters Tab for problem based charting.  Patient discussed with Dr. Evette Doffing

## 2019-12-17 NOTE — Telephone Encounter (Signed)
Return pt's call - stated x 2 days, she has been having left shoulder,neck, and arm pain. Denies sob,cp. Dull pain - took Ibuprofen; which helped the pain some.She uses transportation. Telehealth appt explained to pt - agreeable. Appt schedule for today @ 1345 PM - informed someone will call her between 1 -3PM; have phone near by. Also informed to call 911 if symptoms worsen; voiced understanding.

## 2019-12-17 NOTE — Assessment & Plan Note (Addendum)
Patient called for evaluation of acute onset left neck pain. She states that it started yesterday and radiates into her left shoulder and arm. Describes it as a throbbing sensation. She took 600 mg of ibuprofen that helped to alleviate the pain temporarily. She does not recall any trauma or inciting event. She does not have any weakness down her left arm. She does state that it feels different but is unable to characterize it. She is never had pain like this before. She is concerned is it related to her thyroid. She denies fevers, chills, headache, visual changes, cough, shortness of breath, abdominal pain, diarrhea, lower extremity weakness/numbness, changes in gait.  A/P: - Suspect cervical DDD.  - Celebrex 100 mg BID WC for 5-7 days  - Discussed that if her symptoms worsened, she gets weakness down the left arm, changes in vision she should seek medical attention immediately. She voices understanding.

## 2019-12-18 ENCOUNTER — Ambulatory Visit (INDEPENDENT_AMBULATORY_CARE_PROVIDER_SITE_OTHER): Payer: Medicare Other | Admitting: Podiatry

## 2019-12-18 ENCOUNTER — Encounter (HOSPITAL_COMMUNITY): Payer: Self-pay | Admitting: *Deleted

## 2019-12-18 ENCOUNTER — Other Ambulatory Visit: Payer: Self-pay

## 2019-12-18 ENCOUNTER — Emergency Department (HOSPITAL_COMMUNITY): Payer: Medicare Other

## 2019-12-18 ENCOUNTER — Emergency Department (HOSPITAL_COMMUNITY)
Admission: EM | Admit: 2019-12-18 | Discharge: 2019-12-18 | Disposition: A | Discharge status: Home / Self Care | Payer: Medicare Other | Attending: Emergency Medicine | Admitting: Emergency Medicine

## 2019-12-18 ENCOUNTER — Encounter: Payer: Self-pay | Admitting: Podiatry

## 2019-12-18 DIAGNOSIS — M25512 Pain in left shoulder: Secondary | ICD-10-CM

## 2019-12-18 DIAGNOSIS — M542 Cervicalgia: Secondary | ICD-10-CM | POA: Diagnosis not present

## 2019-12-18 DIAGNOSIS — B351 Tinea unguium: Secondary | ICD-10-CM | POA: Diagnosis not present

## 2019-12-18 DIAGNOSIS — I2510 Atherosclerotic heart disease of native coronary artery without angina pectoris: Secondary | ICD-10-CM | POA: Insufficient documentation

## 2019-12-18 DIAGNOSIS — I10 Essential (primary) hypertension: Secondary | ICD-10-CM | POA: Insufficient documentation

## 2019-12-18 DIAGNOSIS — Z79899 Other long term (current) drug therapy: Secondary | ICD-10-CM | POA: Insufficient documentation

## 2019-12-18 DIAGNOSIS — E119 Type 2 diabetes mellitus without complications: Secondary | ICD-10-CM | POA: Insufficient documentation

## 2019-12-18 DIAGNOSIS — Z794 Long term (current) use of insulin: Secondary | ICD-10-CM | POA: Diagnosis not present

## 2019-12-18 DIAGNOSIS — F17210 Nicotine dependence, cigarettes, uncomplicated: Secondary | ICD-10-CM | POA: Diagnosis not present

## 2019-12-18 DIAGNOSIS — Z7982 Long term (current) use of aspirin: Secondary | ICD-10-CM | POA: Diagnosis not present

## 2019-12-18 DIAGNOSIS — E1151 Type 2 diabetes mellitus with diabetic peripheral angiopathy without gangrene: Secondary | ICD-10-CM | POA: Diagnosis not present

## 2019-12-18 DIAGNOSIS — M79622 Pain in left upper arm: Secondary | ICD-10-CM | POA: Insufficient documentation

## 2019-12-18 DIAGNOSIS — L84 Corns and callosities: Secondary | ICD-10-CM

## 2019-12-18 DIAGNOSIS — M25511 Pain in right shoulder: Secondary | ICD-10-CM | POA: Diagnosis not present

## 2019-12-18 MED ORDER — KETOROLAC TROMETHAMINE 15 MG/ML IJ SOLN
15.0000 mg | Freq: Once | INTRAMUSCULAR | Status: DC
  Filled 2019-12-18: qty 1

## 2019-12-18 MED ORDER — METHOCARBAMOL 500 MG PO TABS: 1000.0000 mg | ORAL_TABLET | Freq: Four times a day (QID) | ORAL | 0 refills | Status: DC

## 2019-12-18 MED ORDER — METHOCARBAMOL 500 MG PO TABS
1000.0000 mg | ORAL_TABLET | Freq: Once | ORAL | Status: AC
  Administered 2019-12-18: 1000 mg via ORAL
  Filled 2019-12-18: qty 2

## 2019-12-18 MED ORDER — KETOROLAC TROMETHAMINE 60 MG/2ML IM SOLN
15.0000 mg | Freq: Once | INTRAMUSCULAR | Status: AC
  Administered 2019-12-18: 15 mg via INTRAMUSCULAR

## 2019-12-18 NOTE — Discharge Instructions (Signed)
Please read and follow all provided instructions.  Your diagnoses today include:  1. Acute pain of left shoulder     Tests performed today include:  An x-ray of the affected area - does NOT show any broken bones  EKG - looks normal  Vital signs. See below for your results today.   Medications prescribed:   Robaxin (methocarbamol) - muscle relaxer medication  DO NOT drive or perform any activities that require you to be awake and alert because this medicine can make you drowsy.   Take any prescribed medications only as directed.  Home care instructions:   Follow any educational materials contained in this packet  Follow R.I.C.E. Protocol:  R - rest your injury   I  - use ice on injury without applying directly to skin  C - compress injury with bandage or splint  E - elevate the injury as much as possible  Follow-up instructions: Please follow-up with your primary care provider if you continue to have significant pain in 1 week. In this case you may have a more severe injury that requires further care.   Return instructions:   Please return if your fingers are numb or tingling, appear gray or blue, or you have severe pain (also elevate the arm and loosen splint or wrap if you were given one)  Please return to the Emergency Department if you experience worsening symptoms.   Please return if you have any other emergent concerns.  Additional Information:  Your vital signs today were: BP 137/86 (BP Location: Right Arm)   Pulse 85   Temp 99 F (37.2 C) (Oral)   Resp 16   Ht 5\' 7"  (1.702 m)   Wt 70.3 kg   SpO2 98%   BMI 24.28 kg/m  If your blood pressure (BP) was elevated above 135/85 this visit, please have this repeated by your doctor within one month. --------------

## 2019-12-18 NOTE — ED Triage Notes (Signed)
Reports 5 days of left shoulder arm and neck pain, any movement is very painful

## 2019-12-18 NOTE — Progress Notes (Signed)
Subjective: Susan Holmes is a 63 y.o. y.o. female who presents with h/o NIDDM with PAD for preventative foot care today with painful calluses b/l which interfere with daily activities. Pain is aggravated when wearing enclosed shoe gear. Pain is relieved with periodic professional debridement.  [name], MD is her PCP.   She would like to know how to keep her calluses from coming back.   Current Outpatient Medications on File Prior to Visit  Medication Sig Dispense Refill  . Accu-Chek FastClix Lancets MISC CHECK BLOOD SUGAR THREE TIMES DAILY AS DIRECTED. DIAG CODE E 11.65. INSULIN DEPENDENT 306 each 1  . amLODipine (NORVASC) 10 MG tablet Take 0.5 tablets (5 mg total) by mouth every morning. (Patient taking differently: Take 5 mg by mouth daily. ) 45 tablet 2  . aspirin EC 81 MG tablet Take 81 mg by mouth daily.    . Blood Glucose Monitoring Suppl (ACCU-CHEK GUIDE) w/Device KIT 1 each by Does not apply route 3 (three) times daily. 1 kit 0  . budesonide-formoterol (SYMBICORT) 160-4.5 MCG/ACT inhaler Inhale 2 puffs into the lungs 2 (two) times daily as needed. (Patient taking differently: Inhale 2 puffs into the lungs 2 (two) times daily as needed (respiratory issues.). ) 2 Inhaler 0  . carvedilol (COREG) 12.5 MG tablet TAKE 1 TABLET(12.5 MG) BY MOUTH TWICE DAILY (Patient taking differently: Take 12.5 mg by mouth 2 (two) times daily. ) 60 tablet 6  . celecoxib (CELEBREX) 100 MG capsule Take 1 capsule (100 mg total) by mouth 2 (two) times daily for 7 days. 14 capsule 0  . chlorthalidone (HYGROTON) 25 MG tablet TAKE 1/2 TABLET BY MOUTH DAILY (Patient taking differently: Take 12.5 mg by mouth daily. ) 45 tablet 3  . cyproheptadine (PERIACTIN) 4 MG tablet Take 4 mg by mouth 2 (two) times daily.    . dorzolamide-timolol (COSOPT) 22.3-6.8 MG/ML ophthalmic solution Place 1 drop into both eyes every evening.     . Dulaglutide (TRULICITY) 1.5 ML/5.4GB SOPN Inject 1.5 mg into the skin every Saturday. 2  mL 3  . DULoxetine (CYMBALTA) 30 MG capsule Take 30 mg by mouth daily.     . fluticasone (FLONASE) 50 MCG/ACT nasal spray Place 1 spray into both nostrils daily. (Patient taking differently: Place 1 spray into both nostrils daily as needed for allergies. ) 9.9 mL 2  . hydrOXYzine (ATARAX/VISTARIL) 25 MG tablet Take 25 mg by mouth 3 (three) times daily as needed for anxiety.    . Insulin Glargine (LANTUS SOLOSTAR) 100 UNIT/ML Solostar Pen Inject 28 Units into the skin at bedtime. 5 pen 5  . insulin lispro (HUMALOG KWIKPEN) 100 UNIT/ML KwikPen Inject 0.05 mLs (5 Units total) into the skin 3 (three) times daily with meals. 15 mL 11  . Insulin Pen Needle (VALUMARK PEN NEEDLES) 31G X 8 MM MISC 1 each by Does not apply route daily. 100 each 3  . losartan (COZAAR) 100 MG tablet Take 1 tablet (100 mg total) by mouth daily. 90 tablet 1  . mirtazapine (REMERON) 45 MG tablet Take 45 mg by mouth at bedtime.    . Multiple Vitamin (MULTIVITAMIN WITH MINERALS) TABS tablet Take 1 tablet by mouth daily.    Glory Rosebush ULTRA test strip USE TO TEST BLOOD GLUCOSE THREE TIMES DAILY 100 strip 12  . prednisoLONE acetate (PRED FORTE) 1 % ophthalmic suspension Place 1 drop into the right eye daily.    . rosuvastatin (CRESTOR) 20 MG tablet Take 1 tablet (20 mg total) by mouth  at bedtime. 30 tablet 6  . traZODone (DESYREL) 100 MG tablet Take 300 mg by mouth at bedtime.     . varenicline (CHANTIX) 0.5 MG tablet Take 1 tablet (0.5 mg total) by mouth 2 (two) times daily. (Patient not taking: Reported on 12/03/2019) 60 tablet 0   No current facility-administered medications on file prior to visit.    Allergies  Allergen Reactions  . Ace Inhibitors Cough  . Lipitor [Atorvastatin] Other (See Comments)    MYALGIA   . Chantix [Varenicline] Nausea And Vomiting    Objective: There were no vitals filed for this visit.  Vascular Examination: Capillary refill time immediate b/l.  Dorsalis pedis pulses palpable  bl.  Posterior tibial pulses absent b/l.  No digital hair x 10 digits  Skin temperature gradient WNL b/l.  Dermatological Examination: Skin thin and atrophic b/l.  Toenails 1-5 b/l discolored, thick, dystrophic with subungual debris and pain with palpation to nailbeds due to thickness of nails.  Evidence of permanent partial nail avulsions b/l great toes with residual spicules.  Hyperkeratotic lesion noted submetatarsal head 3 left, submet head 2 right, plantar heel right foot.  Musculoskeletal: Muscle strength 5/5 to all LE muscle groups b/l.  Hammertoes b/l feet.  Neurological: Sensation intact with 10 gram monofilament.  Assessment: 1. Painful onychomycosis toenails 1-5 b/l 2. Calluses submetatarsal head 3 left, submet head 2 right, plantar heel right foot 3. NIDDM with Peripheral arterial disease  Plan: 1. Discuss diabetic foot care principles. Literature dispensed. 2. Toenails 1-5 b/l were debrided in length and girth without iatrogenic bleeding. 3. Hyperkeratotic lesions submetatarsal head 3 left, submet head 2 right, plantar heel right foot  pared with sterile chisel blade and gently smoothed with burr without incident. 4. Patient to continue soft, supportive shoe gear daily. Discussed diabetic shoe program in regards to helping decrease callus formation. She will think about it.  5. Patient to report any pedal injuries to medical professional immediately. 6. Follow up 3 months.  7. Patient/POA to call should there be a concern in the interim.

## 2019-12-18 NOTE — Patient Instructions (Addendum)
Diabetes Mellitus and Foot Care Foot care is an important part of your health, especially when you have diabetes. Diabetes may cause you to have problems because of poor blood flow (circulation) to your feet and legs, which can cause your skin to:  Become thinner and drier.  Break more easily.  Heal more slowly.  Peel and crack. You may also have nerve damage (neuropathy) in your legs and feet, causing decreased feeling in them. This means that you may not notice minor injuries to your feet that could lead to more serious problems. Noticing and addressing any potential problems early is the best way to prevent future foot problems. How to care for your feet Foot hygiene  Wash your feet daily with warm water and mild soap. Do not use hot water. Then, pat your feet and the areas between your toes until they are completely dry. Do not soak your feet as this can dry your skin.  Trim your toenails straight across. Do not dig under them or around the cuticle. File the edges of your nails with an emery board or nail file.  Apply a moisturizing lotion or petroleum jelly to the skin on your feet and to dry, brittle toenails. Use lotion that does not contain alcohol and is unscented. Do not apply lotion between your toes. Shoes and socks  Wear clean socks or stockings every day. Make sure they are not too tight. Do not wear knee-high stockings since they may decrease blood flow to your legs.  Wear shoes that fit properly and have enough cushioning. Always look in your shoes before you put them on to be sure there are no objects inside.  To break in new shoes, wear them for just a few hours a day. This prevents injuries on your feet. Wounds, scrapes, corns, and calluses  Check your feet daily for blisters, cuts, bruises, sores, and redness. If you cannot see the bottom of your feet, use a mirror or ask someone for help.  Do not cut corns or calluses or try to remove them with medicine.  If you  find a minor scrape, cut, or break in the skin on your feet, keep it and the skin around it clean and dry. You may clean these areas with mild soap and water. Do not clean the area with peroxide, alcohol, or iodine.  If you have a wound, scrape, corn, or callus on your foot, look at it several times a day to make sure it is healing and not infected. Check for: ? Redness, swelling, or pain. ? Fluid or blood. ? Warmth. ? Pus or a bad smell. General instructions  Do not cross your legs. This may decrease blood flow to your feet.  Do not use heating pads or hot water bottles on your feet. They may burn your skin. If you have lost feeling in your feet or legs, you may not know this is happening until it is too late.  Protect your feet from hot and cold by wearing shoes, such as at the beach or on hot pavement.  Schedule a complete foot exam at least once a year (annually) or more often if you have foot problems. If you have foot problems, report any cuts, sores, or bruises to your health care provider immediately. Contact a health care provider if:  You have a medical condition that increases your risk of infection and you have any cuts, sores, or bruises on your feet.  You have an injury that is not   healing.  You have redness on your legs or feet.  You feel burning or tingling in your legs or feet.  You have pain or cramps in your legs and feet.  Your legs or feet are numb.  Your feet always feel cold.  You have pain around a toenail. Get help right away if:  You have a wound, scrape, corn, or callus on your foot and: ? You have pain, swelling, or redness that gets worse. ? You have fluid or blood coming from the wound, scrape, corn, or callus. ? Your wound, scrape, corn, or callus feels warm to the touch. ? You have pus or a bad smell coming from the wound, scrape, corn, or callus. ? You have a fever. ? You have a red line going up your leg. Summary  Check your feet every day  for cuts, sores, red spots, swelling, and blisters.  Moisturize feet and legs daily.  Wear shoes that fit properly and have enough cushioning.  If you have foot problems, report any cuts, sores, or bruises to your health care provider immediately.  Schedule a complete foot exam at least once a year (annually) or more often if you have foot problems. This information is not intended to replace advice given to you by your health care provider. Make sure you discuss any questions you have with your health care provider. Document Released: 12/10/2000 Document Revised: 01/25/2018 Document Reviewed: 01/14/2017 Elsevier Patient Education  [place] are small areas of thickened skin that occur on the top, sides, or tip of a toe. They contain a cone-shaped core with a point that can press on a nerve below. This causes pain.  Calluses are areas of thickened skin that can occur anywhere on the body, including the hands, fingers, palms, soles of the feet, and heels. Calluses are usually larger than corns. What are the causes? Corns and calluses are caused by rubbing (friction) or pressure, such as from shoes that are too tight or do not fit properly. What increases the risk? Corns are more likely to develop in people who have misshapen toes (toe deformities), such as hammer toes. Calluses can occur with friction to any area of the skin. They are more likely to develop in people who:  Work with their hands.  Wear shoes that fit poorly, are too tight, or are high-heeled.  Have toe deformities. What are the signs or symptoms? Symptoms of a corn or callus include:  A hard growth on the skin.  Pain or tenderness under the skin.  Redness and swelling.  Increased discomfort while wearing tight-fitting shoes, if your feet are affected. If a corn or callus becomes infected, symptoms may include:  Redness and swelling that gets worse.  Pain.  Fluid, blood,  or pus draining from the corn or callus. How is this diagnosed? Corns and calluses may be diagnosed based on your symptoms, your medical history, and a physical exam. How is this treated? Treatment for corns and calluses may include:  Removing the cause of the friction or pressure. This may involve: ? Changing your shoes. ? Wearing shoe inserts (orthotics) or other protective layers in your shoes, such as a corn pad. ? Wearing gloves.  Applying medicine to the skin (topical medicine) to help soften skin in the hardened, thickened areas.  Removing layers of dead skin with a file to reduce the size of the corn or callus.  Removing the corn or callus with a scalpel  or laser.  Taking antibiotic medicines, if your corn or callus is infected.  Having surgery, if a toe deformity is the cause. Follow these instructions at home:   Take over-the-counter and prescription medicines only as told by your health care provider.  If you were prescribed an antibiotic, take it as told by your health care provider. Do not stop taking it even if your condition starts to improve.  Wear shoes that fit well. Avoid wearing high-heeled shoes and shoes that are too tight or too loose.  Wear any padding, protective layers, gloves, or orthotics as told by your health care provider.  Soak your hands or feet and then use a file or pumice stone to soften your corn or callus. Do this as told by your health care provider.  Check your corn or callus every day for symptoms of infection. Contact a health care provider if you:  Notice that your symptoms do not improve with treatment.  Have redness or swelling that gets worse.  Notice that your corn or callus becomes painful.  Have fluid, blood, or pus coming from your corn or callus.  Have new symptoms. Summary  Corns are small areas of thickened skin that occur on the top, sides, or tip of a toe.  Calluses are areas of thickened skin that can occur  anywhere on the body, including the hands, fingers, palms, and soles of the feet. Calluses are usually larger than corns.  Corns and calluses are caused by rubbing (friction) or pressure, such as from shoes that are too tight or do not fit properly.  Treatment may include wearing any padding, protective layers, gloves, or orthotics as told by your health care provider. This information is not intended to replace advice given to you by your health care provider. Make sure you discuss any questions you have with your health care provider. Document Released: 09/18/2004 Document Revised: 04/04/2019 Document Reviewed: 10/26/2017 Elsevier Patient Education  2020 [place].  Diabetic Neuropathy Diabetic neuropathy refers to nerve damage that is caused by diabetes (diabetes mellitus). Over time, people with diabetes can develop nerve damage throughout the body. There are several types of diabetic neuropathy:  Peripheral neuropathy. This is the most common type of diabetic neuropathy. It causes damage to nerves that carry signals between the spinal cord and other parts of the body (peripheral nerves). This usually affects nerves in the feet and legs first, and may eventually affect the hands and arms. The damage affects the ability to sense touch or temperature.  Autonomic neuropathy. This type causes damage to nerves that control involuntary functions (autonomic nerves). These nerves carry signals that control: ? Heartbeat. ? Body temperature. ? Blood pressure. ? Urination. ? Digestion. ? Sweating. ? Sexual function. ? Response to changing blood sugar (glucose) levels.  Focal neuropathy. This type of nerve damage affects one area of the body, such as an arm, a leg, or the face. The injury may involve one nerve or a small group of nerves. Focal neuropathy can be painful and unpredictable, and occurs most often in older adults with diabetes. This often develops suddenly, but usually improves over  time and does not cause long-term problems.  Proximal neuropathy. This type of nerve damage affects the nerves of the thighs, hips, buttocks, or legs. It causes severe pain, weakness, and muscle death (atrophy), usually in the thigh muscles. It is more common among older men and people who have type 2 diabetes. The length of recovery time may vary. What are the  causes? Peripheral, autonomic, and focal neuropathies are caused by diabetes that is not well controlled with treatment. The cause of proximal neuropathy is not known, but it may be caused by inflammation related to uncontrolled blood glucose levels. What are the signs or symptoms? Peripheral neuropathy Peripheral neuropathy develops slowly over time. When the nerves of the feet and legs no longer work, you may experience:  Burning, stabbing, or aching pain in the legs or feet.  Pain or cramping in the legs or feet.  Loss of feeling (numbness) and inability to feel pressure or pain in the feet. This can lead to: ? Thick calluses or sores on areas of constant pressure. ? Ulcers. ? Reduced ability to feel temperature changes.  Foot deformities.  Muscle weakness.  Loss of balance or coordination. Autonomic neuropathy The symptoms of autonomic neuropathy vary depending on which nerves are affected. Symptoms may include:  Problems with digestion, such as: ? Nausea or vomiting. ? Poor appetite. ? Bloating. ? Diarrhea or constipation. ? Trouble swallowing. ? Losing weight without trying to.  Problems with the heart, blood and lungs, such as: ? Dizziness, especially when standing up. ? Fainting. ? Shortness of breath. ? Irregular heartbeat.  Bladder problems, such as: ? Trouble starting or stopping urination. ? Leaking urine. ? Trouble emptying the bladder. ? Urinary tract infections (UTIs).  Problems with other body functions, such as: ? Sweat. You may sweat too much or too little. ? Temperature. You might get hot  easily. Or, you might feel cold more than usual. ? Sexual function. Men may not be able to get or maintain an erection. Women may have vaginal dryness and difficulty with arousal. Focal neuropathy Symptoms affect only one area of the body. Common symptoms include:  Numbness.  Tingling.  Burning pain.  Prickling feeling.  Very sensitive skin.  Weakness.  Inability to move (paralysis).  Muscle twitching.  Muscles getting smaller (wasting).  Poor coordination.  Double or blurred vision. Proximal neuropathy  Sudden, severe pain in the hip, thigh, or buttocks. Pain may spread from the back into the legs (sciatica).  Pain and numbness in the arms and legs.  Tingling.  Loss of bladder control or bowel control.  Weakness and wasting of thigh muscles.  Difficulty getting up from a seated position.  Abdominal swelling.  Unexplained weight loss. How is this diagnosed? Diagnosis usually involves reviewing your medical history and any symptoms you have. Diagnosis varies depending on the type of neuropathy your health care provider suspects. Peripheral neuropathy Your health care provider will check areas that are affected by your nervous system (neurologic exam), such as your reflexes, how you move, and what you can feel. You may have other tests, such as:  Blood tests.  Removal and examination of fluid that surrounds the spinal cord (lumbar puncture).  CT scan.  MRI.  A test to check the nerves that control muscles (electromyogram, EMG).  Tests of how quickly messages pass through your nerves (nerve conduction velocity tests).  Removal of a small piece of nerve to be examined under a microscope (biopsy). Autonomic neuropathy You may have tests, such as:  Tests to measure your blood pressure and heart rate. This may include monitoring you while you are safely secured to an exam table that moves you from a lying position to an upright position (table tilt  test).  Breathing tests to check your lungs.  Tests to check how food moves through the digestive system (gastric emptying tests).  Blood, sweat, or  urine tests.  Ultrasound of your bladder.  Spinal fluid tests. Focal neuropathy This condition may be diagnosed with:  A neurologic exam.  CT scan.  MRI.  EMG.  Nerve conduction velocity tests. Proximal neuropathy There is no test to diagnose this type of neuropathy. You may have tests to rule out other possible causes of this type of neuropathy. Tests may include:  X-rays of your spine and lumbar region.  Lumbar puncture.  MRI. How is this treated? The goal of treatment is to keep nerve damage from getting worse. The most important part of treatment is keeping your blood glucose level and your A1C level within your target range by following your diabetes management plan. Over time, maintaining lower blood glucose levels helps lessen symptoms. In some cases, you may need prescription pain medicine. Follow these instructions at home:  Lifestyle   Do not use any products that contain nicotine or tobacco, such as cigarettes and e-cigarettes. If you need help quitting, ask your health care provider.  Be physically active every day. Include strength training and balance exercises.  Follow a healthy meal plan.  Work with your health care provider to manage your blood pressure. General instructions  Follow your diabetes management plan as directed. ? Check your blood glucose levels as directed by your health care provider. ? Keep your blood glucose in your target range as directed by your health care provider. ? Have your A1C level checked at least two times a year, or as often as told by your health care provider.  Take over the counter and prescription medicines only as told by your health care provider. This includes insulin and diabetes medicine.  Do not drive or use heavy machinery while taking prescription pain  medicines.  Check your skin and feet every day for cuts, bruises, redness, blisters, or sores.  Keep all follow up visits as told by your health care provider. This is important. Contact a health care provider if:  You have burning, stabbing, or aching pain in your legs or feet.  You are unable to feel pressure or pain in your feet.  You develop problems with digestion, such as: ? Nausea. ? Vomiting. ? Bloating. ? Constipation. ? Diarrhea. ? Abdominal pain.  You have difficulty with urination, such as inability: ? To control when you urinate (incontinence). ? To completely empty the bladder (retention).  You have palpitations.  You feel dizzy, weak, or faint when you stand up. Get help right away if:  You cannot urinate.  You have sudden weakness or loss of coordination.  You have trouble speaking.  You have pain or pressure in your chest.  You have an irregular heart beat.  You have sudden inability to move a part of your body. Summary  Diabetic neuropathy refers to nerve damage that is caused by diabetes. It can affect nerves throughout the entire body, causing numbness and pain in the arms, legs, digestive tract, heart, and other body systems.  Keep your blood glucose level and your blood pressure in your target range, as directed by your health care provider. This can help prevent neuropathy from getting worse.  Check your skin and feet every day for cuts, bruises, redness, blisters, or sores.  Do not use any products that contain nicotine or tobacco, such as cigarettes and e-cigarettes. If you need help quitting, ask your health care provider. This information is not intended to replace advice given to you by your health care provider. Make sure you discuss any questions  you have with your health care provider. Document Released: 02/21/2002 Document Revised: 01/25/2018 Document Reviewed: 01/17/2017 Elsevier Patient Education  2020 [employer].

## 2019-12-18 NOTE — ED Provider Notes (Signed)
[facility] DEPT Provider Note   CSN: [id number] Arrival date & time: 12/18/19  1442     History Chief Complaint  Patient presents with  . Shoulder Pain    Left    Susan Holmes is a 63 y.o. female.  Patient with history of diabetes, coronary artery disease, presents the emergency department today with complaint of left shoulder pain.  Symptoms have been present for 4 to 5 days.  Patient describes a sharp pain in her left neck that radiates to her left shoulder and down to her left elbow.  Pain is not really worse with movement but she has paroxysms of pain in her arm.  No chest pain or shortness of breath.  No fevers, cough or chills.  No weakness in her arms or her legs.  She is walking without any difficulties.  No headache, vision changes or vomiting.  Patient had a telephone visit with her PCP yesterday.  She was prescribed Celebrex for these sx, concern for DDD.  She has taken 2 doses of this.  She does not think it is helping.        Past Medical History:  Diagnosis Date  . Anxiety   . Arthritis   . Chest pain 09/26/2018  . Congenital blindness    Left  eye  . Coronary artery disease    per cardiac cath 2015-- mild disease involving LAD and branches ,  LCFx and branches, and mild disease pRCA  . Depression   . Diabetes mellitus, type II ([place])    followed by pcp  . Glaucoma   . Glaucoma, both eyes   . Hepatitis B infection pt unsure   resolved  . History of kidney stones    right stone  . History of transient ischemic attack (TIA) 2017   per pt no residuals  . HTN (hypertension)    followed by pcp  . Hyperlipidemia   . PAD (peripheral artery disease) ([place])   . Poor dental hygiene   . Stroke ([place])   . Tobacco abuse    .5 PPD smoker    Patient Active Problem List   Diagnosis Date Noted  . Neck pain on left side 12/17/2019  . PAD (peripheral artery disease) ([place]) 10/22/2019  . Sinus congestion 10/11/2019  . PVD (peripheral  vascular disease) ([place]) 10/04/2019  . Insulin dependent type 2 diabetes mellitus ([place]) 10/04/2019  . Claudication ([place]) 09/14/2019  . Left foot pain 07/12/2019  . Paronychia of great toe, left 06/21/2019  . Orthostatic hypotension 02/15/2019  . Diaphoresis 02/15/2019  . Nephrolithiasis 10/03/2018  . Left chest pressure 09/26/2018  . Upper back pain on right side 09/19/2018  . Callus of foot 09/19/2018  . Reactive airway disease 06/20/2018  . Unintentional weight loss 06/14/2018  . History of vitamin D deficiency 09/06/2017  . Forgetfulness 06/07/2017  . Dyspepsia 04/26/2017  . History of CVA (cerebrovascular accident) 07/23/2016  . Cough 07/08/2016  . Lower back pain 08/06/2015  . At risk for polypharmacy 08/06/2015  . Osteoarthritis 12/11/2014  . History of chest pain 10/07/2014  . Pre-operative cardiovascular examination 10/07/2014  . Diarrhea 04/04/2014  . Liver hemangioma 04/04/2014  . RBC microcytosis 02/05/2014  . Hyperparathyroidism ([place]) 03/08/2013  . Calcific tendinitis of right shoulder 02/26/2012  . Fatigue 12/14/2011  . Uncontrolled type II diabetes mellitus ([place]) 06/12/2007  . Hyperlipidemia associated with type 2 diabetes mellitus ([place]) 06/12/2007  . ANXIETY 06/12/2007  . TOBACCO ABUSE 06/12/2007  . Depression 06/12/2007  .  CONGENITAL BLINDNESS, LEFT EYE 06/12/2007  . Essential hypertension 06/12/2007    Past Surgical History:  Procedure Laterality Date  . ABDOMINAL AORTOGRAM W/LOWER EXTREMITY N/A 09/14/2019   Procedure: ABDOMINAL AORTOGRAM W/LOWER EXTREMITY;  Surgeon: Elam Dutch, MD;  Location: [facility];  Service: Cardiovascular;  Laterality: N/A;  . ABDOMINAL HYSTERECTOMY  1980s   unsure if ovaries were taken  . BYPASS GRAFT Left 10/22/2019    Left femoral to above-knee popliteal bypass using reversed ipsilateral greater saphenous vein  . colonoscopy    . FEMORAL-POPLITEAL BYPASS GRAFT Left 10/22/2019   Procedure: LEFT FEMORAL-ABOVE KNEE  POPLITEAL  BYPASS GRAFT with REVERSED GREATER SAPHENOUS VEIN;  Surgeon: Elam Dutch, MD;  Location: [facility];  Service: Vascular;  Laterality: Left;  . GLAUCOMA SURGERY Right 2017  . IR URETERAL STENT RIGHT NEW ACCESS W/O SEP NEPHROSTOMY CATH  10/03/2018  . LEFT HEART CATHETERIZATION WITH CORONARY ANGIOGRAM N/A 10/09/2014   Procedure: LEFT HEART CATHETERIZATION WITH CORONARY ANGIOGRAM;  Surgeon: Troy Sine, MD;  Location: [facility] C[facility] LAB;  Service: Cardiovascular;  Laterality: N/A;  . NEPHROLITHOTOMY Right 10/03/2018   Procedure: RIGHT NEPHROLITHOTOMY PERCUTANEOUS FIRST STAGE;  Surgeon: Irine Seal, MD;  Location: [facility];  Service: Urology;  Laterality: Right;  . PERIPHERAL VASCULAR INTERVENTION Left 09/14/2019   Procedure: PERIPHERAL VASCULAR INTERVENTION;  Surgeon: Elam Dutch, MD;  Location: [facility];  Service: Cardiovascular;  Laterality: Left;   left external iliac stent   . TEE WITHOUT CARDIOVERSION N/A 08/17/2016   Procedure: TRANSESOPHAGEAL ECHOCARDIOGRAM (TEE);  Surgeon: Sueanne Margarita, MD;  Location: [facility] ENDOSCOPY;  Service: Cardiovascular;  Laterality: N/A;     OB History   No obstetric history on file.     Family History  Problem Relation Age of Onset  . Heart disease Mother 63  . Hypertension Mother   . CVA Mother   . Heart disease Father 108  . CVA Sister   . CVA Brother   . Aneurysm Brother   . Aneurysm Sister   . CVA Maternal Grandmother   . CVA Maternal Grandfather   . Colon cancer Neg Hx   . Esophageal cancer Neg Hx   . Stomach cancer Neg Hx     Social History   Tobacco Use  . Smoking status: Current Every Day Smoker    Packs/day: 0.50    Years: 39.00    Pack years: 19.50    Types: Cigarettes    Start date: 3  . Smokeless tobacco: Current User    Types: Chew  . Tobacco comment: half pack per day per patient report 10/25/2019  Substance Use Topics  . Alcohol use: No    Alcohol/week: 0.0 standard drinks  . Drug use: No    Home  Medications Prior to Admission medications   Medication Sig Start Date End Date Taking? Authorizing Provider  Accu-Chek FastClix Lancets MISC CHECK BLOOD SUGAR THREE TIMES DAILY AS DIRECTED. DIAG CODE E 11.65. INSULIN DEPENDENT 09/26/19   [name], MD  amLODipine (NORVASC) 10 MG tablet Take 0.5 tablets (5 mg total) by mouth every morning. Patient taking differently: Take 5 mg by mouth daily.  03/27/19   Dorrell, Andree Elk, MD  aspirin EC 81 MG tablet Take 81 mg by mouth daily.    [provider]  Blood Glucose Monitoring Suppl (ACCU-CHEK GUIDE) w/Device KIT 1 each by Does not apply route 3 (three) times daily. 08/05/17   Shela Leff, MD  budesonide-formoterol [facility]) 160-4.5 MCG/ACT inhaler Inhale  2 puffs into the lungs 2 (two) times daily as needed. Patient taking differently: Inhale 2 puffs into the lungs 2 (two) times daily as needed (respiratory issues.).  05/22/19   Magdalen Spatz, NP  carvedilol (COREG) 12.5 MG tablet TAKE 1 TABLET(12.5 MG) BY MOUTH TWICE DAILY Patient taking differently: Take 12.5 mg by mouth 2 (two) times daily.  12/31/18   Dorrell, Andree Elk, MD  celecoxib (CELEBREX) 100 MG capsule Take 1 capsule (100 mg total) by mouth 2 (two) times daily for 7 days. 12/17/19 12/24/19  Ina Homes, MD  chlorthalidone (HYGROTON) 25 MG tablet TAKE 1/2 TABLET BY MOUTH DAILY Patient taking differently: Take 12.5 mg by mouth daily.  10/08/19   Skeet Latch, MD  cyproheptadine (PERIACTIN) 4 MG tablet Take 4 mg by mouth 2 (two) times daily.    [provider]  dorzolamide-timolol (COSOPT) 22.3-6.8 MG/ML ophthalmic solution Place 1 drop into both eyes every evening.     [provider]  Dulaglutide (TRULICITY) 1.5 IW/9.7LG SOPN Inject 1.5 mg into the skin every Saturday. 11/03/19   [name], MD  DULoxetine (CYMBALTA) 30 MG capsule Take 30 mg by mouth daily.  08/01/19   [provider]  fluticasone (FLONASE) 50 MCG/ACT nasal spray Place 1  spray into both nostrils daily. Patient taking differently: Place 1 spray into both nostrils daily as needed for allergies.  10/10/19 10/09/20  Ina Homes, MD  hydrOXYzine (ATARAX/VISTARIL) 25 MG tablet Take 25 mg by mouth 3 (three) times daily as needed for anxiety.    [provider]  Insulin Glargine (LANTUS SOLOSTAR) 100 UNIT/ML Solostar Pen Inject 28 Units into the skin at bedtime. 10/10/19   Helberg, Larkin Ina, MD  insulin lispro (HUMALOG KWIKPEN) 100 UNIT/ML KwikPen Inject 0.05 mLs (5 Units total) into the skin 3 (three) times daily with meals. 10/19/19   Helberg, Larkin Ina, MD  Insulin Pen Needle (VALUMARK PEN NEEDLES) 31G X 8 MM MISC 1 each by Does not apply route daily. 10/17/19   Ina Homes, MD  losartan (COZAAR) 100 MG tablet Take 1 tablet (100 mg total) by mouth daily. 09/26/19 09/25/20  [name], MD  mirtazapine (REMERON) 45 MG tablet Take 45 mg by mouth at bedtime.    [provider]  Multiple Vitamin (MULTIVITAMIN WITH MINERALS) TABS tablet Take 1 tablet by mouth daily.    [provider]  [facility] ULTRA test strip USE TO TEST BLOOD GLUCOSE THREE TIMES DAILY 08/21/19   [name], MD  prednisoLONE acetate (PRED FORTE) 1 % ophthalmic suspension Place 1 drop into the right eye daily.    [provider]  rosuvastatin (CRESTOR) 20 MG tablet Take 1 tablet (20 mg total) by mouth at bedtime. 11/15/19 11/14/20  Skeet Latch, MD  traZODone (DESYREL) 100 MG tablet Take 300 mg by mouth at bedtime.  01/29/17   [provider]  varenicline (CHANTIX) 0.5 MG tablet Take 1 tablet (0.5 mg total) by mouth 2 (two) times daily. Patient not taking: Reported on 12/03/2019 11/15/19   Skeet Latch, MD    Allergies    Ace inhibitors, Lipitor [atorvastatin], and Chantix [varenicline]  Review of Systems   Review of Systems  Constitutional: Negative for activity change, diaphoresis and fever.  Eyes: Negative for redness and visual disturbance.    Respiratory: Negative for cough and shortness of breath.   Cardiovascular: Negative for chest pain, palpitations and leg swelling.  Gastrointestinal: Negative for abdominal pain, nausea and vomiting.  Genitourinary: Negative for dysuria.  Musculoskeletal: Positive for  arthralgias, myalgias and neck pain. Negative for back pain and joint swelling.  Skin: Negative for rash and wound.  Neurological: Negative for syncope, weakness, light-headedness and numbness.  Psychiatric/Behavioral: The patient is not nervous/anxious.     Physical Exam Updated Vital Signs BP 137/86 (BP Location: Right Arm)   Pulse 85   Temp 99 F (37.2 C) (Oral)   Resp 16   Ht _0  (1.702 m)   Wt 70.3 kg   SpO2 98%   BMI 24.28 kg/m   Physical Exam Vitals and nursing note reviewed.  Constitutional:      Appearance: She is well-developed.     Comments: Upon entering room, patient is leaning forward in the exam chair holding her left upper arm.  She is having paroxysms of pain and wincing when this pain hits.  She then has periods of relative calm appearing comfortable.  HENT:     Head: Normocephalic and atraumatic.  Eyes:     General:        Right eye: No discharge.        Left eye: No discharge.     Conjunctiva/sclera: Conjunctivae normal.  Cardiovascular:     Rate and Rhythm: Normal rate and regular rhythm.     Pulses:          Radial pulses are 2+ on the right side and 2+ on the left side.     Heart sounds: Normal heart sounds.  Pulmonary:     Effort: Pulmonary effort is normal.     Breath sounds: Normal breath sounds.  Abdominal:     Palpations: Abdomen is soft.     Tenderness: There is no abdominal tenderness. There is no guarding or rebound.  Musculoskeletal:     Left shoulder: Tenderness and bony tenderness present. No swelling or deformity. Normal range of motion.     Left upper arm: No swelling, tenderness or bony tenderness.     Cervical back: Normal range of motion and neck supple. Spasms  and tenderness present. No pain with movement. Normal range of motion.     Thoracic back: Normal.     Lumbar back: Normal.  Skin:    General: Skin is warm and dry.  Neurological:     Mental Status: She is alert.     ED Results / Procedures / Treatments   Labs (all labs ordered are listed, but only abnormal results are displayed) Labs Reviewed - No data to display  ED ECG REPORT   Date: 12/18/2019  Rate: 80  Rhythm: normal sinus rhythm  QRS Axis: normal  Intervals: normal  ST/T Wave abnormalities: normal  Conduction Disutrbances:none  Narrative Interpretation:   Old EKG Reviewed: unchanged (borderline t-wave abnormality on previous, now normalized)  I have personally reviewed the EKG tracing and agree with the computerized printout as noted.  Radiology DG Shoulder Left  Result Date: 12/18/2019 CLINICAL DATA:  63 year old female with left shoulder pain. EXAM: LEFT SHOULDER - 2+ VIEW COMPARISON:  Chest radiograph dated 06/27/2016. FINDINGS: There is no acute fracture or dislocation. There is minimal arthritic changes of the left shoulder with minimal irregularity of the glenoid and spurring. There is mild osteopenia. The soft tissues are unremarkable. IMPRESSION: 1. No acute fracture or dislocation. 2. Minimal arthritic changes of the left shoulder. Electronically Signed   By: Anner Crete M.D.   On: 12/18/2019 15:48    Procedures Procedures (including critical care time)  Medications Ordered in ED Medications  ketorolac (TORADOL) 15 MG/ML injection 15 mg (  has no administration in time range)  methocarbamol (ROBAXIN) tablet 1,000 mg (has no administration in time range)    ED Course  I have reviewed the triage vital signs and the nursing notes.  Pertinent labs & imaging results that were available during my care of the patient were reviewed by me and considered in my medical decision making (see chart for details).  Patient seen and examined.  Appears to have  musculoskeletal pain, possible radicular type pain however no previous history.  She has only taken 2 doses of her prescribed NSAID.  No chest pain or shortness of breath to suggest infection, PE, ACS. Symptoms would be very atypical for ACS, PE, pneumonia.  Do not suspect carotid or vertebral artery dissection given history.  She does not have any neurological deficits.  Given previous history and risk factors, will check EKG.  We will attempt to get the patient's pain better controlled.  IM Toradol, oral Robaxin, and a sling ordered.  Discussed with patient that she should not expect resolution of her symptoms after only 2 doses of Celebrex.  We discussed importance of follow-up.  Vital signs reviewed and are as follows: BP 137/86 (BP Location: Right Arm)   Pulse 85   Temp 99 F (37.2 C) (Oral)   Resp 16   Ht _0  (1.702 m)   Wt 70.3 kg   SpO2 98%   BMI 24.28 kg/m   6:06 PM EKG reviewed.  No concerns.  Patient has received her medications and sling here.  Plan to discharge.    MDM Rules/Calculators/A&P                      Patient with pain in the left shoulder and left upper arm.  Possible etiologies include sprain, degenerative disc disease with radiculopathy, muscle spasm.  Given cardiac history, EKG checked and is reassuring.  Patient does not have any chest pain or shortness of breath to suggest PE or ACS.  No signs concerning for pneumonia.  Patient will continue NSAIDs prescribed by her doctor.  I have added Robaxin as well.  She has received a dose of Toradol in the emergency department as well as a sling for comfort.  No indication for hospitalization or further work-up at this time.  Upper extremity is neurovascularly intact with normal pulses and distal sensation.   Final Clinical Impression(s) / ED Diagnoses Final diagnoses:  Acute pain of left shoulder    Rx / DC Orders ED Discharge Orders    None       Carlisle Cater, PA-C 12/18/19 1808    Hayden Rasmussen,  MD 12/19/19 1225

## 2019-12-22 ENCOUNTER — Other Ambulatory Visit: Payer: Self-pay | Admitting: Cardiovascular Disease

## 2020-01-03 ENCOUNTER — Other Ambulatory Visit: Payer: Self-pay | Admitting: *Deleted

## 2020-01-03 ENCOUNTER — Encounter: Payer: Self-pay | Admitting: *Deleted

## 2020-01-03 NOTE — Patient Outreach (Signed)
[facility]) Care Management [name] Telephone Outreach Post-hospital discharge day # 72 without unplanned hospital re-admission Unsuccessful (consecutive) outreach attempt # 1- previously engaged patient  01/03/2020  Susan Holmes June 18, 1956 [id number]  Unsuccessful telephone outreach to [name], 64 y/o female referred to [facility] by [facility] Liaison after recent planned hospital visit October 26-27, 2020 for (L) femoral popliteal bypass due to PAD/ PVD.Patient was discharged home to self care without home health services.Patient has history including, but not limited to, PAD/PVD with claudication; HTN/ HLD; DM-II with insulin therapy; previous CVA; glaucoma; depression.  HIPAA compliant voice mail message left for patient, requesting return call back.  Plan:  Will re-attempt [place] telephone outreach within 4 business days if I do not hear back from patient first.  Oneta Rack, RN, BSN, [place] Coordinator [facility] Care Management  [phone]

## 2020-01-04 ENCOUNTER — Other Ambulatory Visit: Payer: Self-pay

## 2020-01-04 ENCOUNTER — Telehealth: Payer: Self-pay | Admitting: Internal Medicine

## 2020-01-04 DIAGNOSIS — R634 Abnormal weight loss: Secondary | ICD-10-CM

## 2020-01-04 NOTE — Telephone Encounter (Signed)
Pt is returning call pls contact [phone]

## 2020-01-04 NOTE — Telephone Encounter (Signed)
Pt calling regarding results; pt contact [phone]

## 2020-01-04 NOTE — Telephone Encounter (Signed)
Attempted to call the patient to discuss her results. No answer. HIPAA compliant voicemail left.

## 2020-01-07 MED ORDER — ENSURE PO LIQD: 1.0000 | Freq: Two times a day (BID) | ORAL | 12 refills | Status: DC

## 2020-01-07 NOTE — Telephone Encounter (Signed)
Patient called in for discussion of her labs from 12/11. She was evaluated by Dr. Frederico Hamman unintentional weight loss. At the time she was found to be hypercalcemic with an elevated alkaline phosphatase. Her PTH was inappropriately normal. In addition to this she did have an elevated ESR. She was referred to ENT for further evaluation given that she may be symptomatic. She has an appointment with them later this month.  Per chart review she has lost approximately 20 pounds over the last two years. She is requesting a prescription for ensure. I have asked her to hold her chlorthalidone while she be evaluated for hypercalcemia.

## 2020-01-09 ENCOUNTER — Other Ambulatory Visit: Payer: Self-pay | Admitting: *Deleted

## 2020-01-09 ENCOUNTER — Encounter: Payer: Self-pay | Admitting: *Deleted

## 2020-01-09 NOTE — Patient Outreach (Signed)
[facility]) Care Management [place] Telephone Outreach Post-hospital discharge day # 77 without unplanned hospital re-admission  01/09/2020  Susan Holmes April 16, 1956 [id number]  Successful telephone outreach to Susan Holmes, 64 y/o female referred to Susan Holmes by Susan Holmes after recent planned hospital visit October 26-27, 2020 for (L) femoral popliteal bypass due to PAD/ PVD.Patient was discharged home to self care without home health services.Patient has history including, but not limited to, PAD/PVD with claudication; HTN/ HLD; DM-II with insulin therapy; previous CVA; glaucoma; depression.  HIPAA/ identity verified during phone call today. Patient reports that she has "been doing just fine," and she reports "occasional" ongoing pain in (L) leg post- recent surgery;  today reports pain at "4/10" and states it "comes and goes," stating that OTC medications "help a little bit."  She denies new/ recent falls and  clinical concerns, issues/ problems today and sounds to be in no distress throughout phone call today.  Patient further reports:  -- continues self- managing medicationsand denies concerns/ recent changes around medications; able to accurately verbalize dosing for long- and short-acting insulin and endorses adherence to all medications as prescribed -- reports ongoing weight loss, stating that she has spoken to PCP about this and started using nutritional supplements as instructed; reports weight loss due to "recent surgery and possible thyroid issues;" states to see a specialist as referred by PCP, but is unable to tell me whom the specialists is; states she has an appointment on 01/25/2020 with specialist; confirms that she will continue using established transportation services -- requests contact information for [name] previously spoke to patient about community resources available to her for clothing; I reviewed these resources  with patient per [facility] CSW note dated 10/26/2019, and encouraged patient to contact [facility] BSW should she have ongoing questions/ needs and she verbalizes agreement  Self-health management ofPAD/ PVD and DM: --continues to struggle with efforts to decrease/ stop smoking; states that she"has not quit completely" and reports smoking "about 4 cigarettes a day," states that she has been under stress around family health issues and this has prevented her from quitting completely; states she is "still trying."  Encouragement provided; confirmed that patient received and rveiewed previously provided EMMI education around smoking cessation and she denies questions; states she "knows" what to do- but just "hasn't done it" yet- encouraged patient to continue efforts to stop smoking   -- blood sugars "still running a lot better;" confirms again that she received previously provided printed educational material around self-health management/ dietary strategies for decreasing blood sugars; states she is 'eating much healthier."  States that she checks blood sugars at night and reports they are consistently running "between 140-170."  Still not consistently recording blood sugars at home, states she "just forgets" ---- reviewed with patient A1-C values over last year and reiterated significance of A1-C values; patient reports that she is "sure" her A1-C level will be better than her last (10.2 in October 2020); again encouraged patient to record blood sugars at home and explained that when these values are recorded, she can review and actually predict A1-C values from averages at home; patient stated that she "just can't remember" to write down blood sugars," and she does not appear interested in doing so ---- again encouraged patient to continue make small changes over time for lasting effect; we again discussed elimination of sugar beverages as a good basic/ simple starting point to lower blood sugar ranges; patient  reports she  rarely drinks soda, but admits she "sometimes does."  Patient denies further issues, concerns, or problems today. I confirmed that patient hasmy direct phone number, the main [facility] CM office phone number, and the [facility] CM 24-hour nurse advice phone number should issues arise prior to next scheduled [place] outreach by phone next month. Encouraged patient to contact me directly if needs, questions, issues, or concerns arise prior to next scheduled outreach; patient agreed to do so.  Plan:  Patient will take medications as prescribed and will attend all scheduled provider appointments  Patient will promptly notify care providers for any new concerns/ issues/ problems that arise  Patient willcontinue her efforts to decrease/ stop smoking  Patient will continue monitoring/ and begin recording dailyblood sugarsand limiting carb/ sugar intake  [facility] CM outreach to continue with scheduled phone callnext month  [facility] CM Care Plan Problem Two     Most Recent Value  Care Plan Problem Two  Self-health management of chronic disease state of DM, as evidenced by patient reporting  Role Documenting the Problem Two  Care Management [place] for Problem Two  Active  Interventions for Problem Two Long Term Goal   Confirmed that patient continues monitoring and recording HS blood sugars,  encouraged patient to become more consistent with blood sugar recording at home,  confirmed that patient believes her blood sugars over last month have progressively been improved,  discussed most recent A1-C trends with patient and encouraged her to schedule PCP appointment for re-evaluation of A1-C,  reviewed with patient significance of A1C values over time  [facility] Long Term Goal  Over the next 60 days, patient will continue monitoring and begin recording daily blood sugars at home, as evidenced by patient reporting/ review of same during [facility] RN CM outreach [goal  re-established/ extended]  [facility] Long Term Goal Start Date  01/09/20  [facility] CM Short Term Goal #2   Over the next 30 days, patient will verbalize 3 dietary strategies to decrease blood sugars, as evidenced by patient reporting during [facility] RN CM outreach  [facility] CM Short Term Goal #2 Start Date  12/03/19  [facility] CM Short Term Goal #2 Met Date  01/09/20 [Goal met]  Interventions for Short Term Goal #2  Confirmed that patient received and has reviewed previously provided printed educational material around dietary strategies in setting of DM to decrease blood sugars,  discussed specific strategies with patient such as eliminating sugary drinks/ beverages form her diet,  confirmed that patient has no questions about dietary strategies to decrease blood sugars and feels she has been following diet appropriately,  nutritional assessment completed    [facility] CM Care Plan Problem Three     Most Recent Value  Care Plan Problem Three  Self-health management of chronic disease state of PAD/ PVD, in patient who continues to smoke, as evidenced by patient reporting  Role Documenting the Problem Three  Care Management Coordinator  Care Plan for Problem Three  Active  [facility]N Long Term Goal   Over the next 60 days, patient will stop smoking as evidenced by patient reporting during [facility] RN CM outreach  [facility] Long Term Goal Start Date  12/03/19  Interventions for Problem Three Long Term Goal  Discussed patient's progress around smoking cessation and confirmed that she has not yet quit smoking,  discussed stratgies to stop smoking,  encouraged patient to continue making progress toward meeting goal and confirmed that she received previously mailed printed educatinal material around smoking cessation  Oneta Rack, RN, BSN, [employer] [facility] Care Management  [phone]

## 2020-01-20 ENCOUNTER — Other Ambulatory Visit: Payer: Self-pay | Admitting: Internal Medicine

## 2020-01-20 ENCOUNTER — Other Ambulatory Visit: Payer: Self-pay | Admitting: Cardiovascular Disease

## 2020-01-22 ENCOUNTER — Other Ambulatory Visit: Payer: Self-pay

## 2020-01-22 NOTE — Telephone Encounter (Signed)
OK to refill

## 2020-01-22 NOTE — Telephone Encounter (Signed)
Refilled as requested  

## 2020-01-25 DIAGNOSIS — H6123 Impacted cerumen, bilateral: Secondary | ICD-10-CM | POA: Diagnosis not present

## 2020-01-25 DIAGNOSIS — R5382 Chronic fatigue, unspecified: Secondary | ICD-10-CM | POA: Diagnosis not present

## 2020-01-25 DIAGNOSIS — E213 Hyperparathyroidism, unspecified: Secondary | ICD-10-CM | POA: Diagnosis not present

## 2020-01-30 ENCOUNTER — Other Ambulatory Visit (INDEPENDENT_AMBULATORY_CARE_PROVIDER_SITE_OTHER): Payer: Self-pay | Admitting: Otolaryngology

## 2020-01-31 LAB — PARATHYROID HORMONE, INTACT (NO CA): PTH: 50 pg/mL (ref 15–65)

## 2020-01-31 LAB — CALCIUM: Calcium: 10 mg/dL (ref 8.7–10.3)

## 2020-02-01 ENCOUNTER — Encounter: Payer: Self-pay | Admitting: *Deleted

## 2020-02-01 ENCOUNTER — Other Ambulatory Visit: Payer: Self-pay | Admitting: *Deleted

## 2020-02-01 NOTE — Patient Outreach (Signed)
[facility]) Care Management [place] Telephone Outreach Post-hospital discharge day # 100- without unplanned hospital re-admission  02/01/2020  Susan Holmes May 03, 1956 [id number]  Successful telephone outreach to Susan Holmes, 64 y/o female referred to [place] by [facility]iaison after recent planned hospital visit October 26-27, 2020 for (L) femoral popliteal bypass due to PAD/ PVD.Patient was discharged home to self care without home health services.Patient has history including, but not limited to, PAD/PVD with claudication; HTN/ HLD; DM-II with insulin therapy; previous CVA; glaucoma; depression.  HIPAA/ identity verified during phone call today. Patient reports that she "is doing about the same;" and she denies pain, clinical concerns, new/ recent falls.  Patient sounds to be in no distress throughout phone call today.  Patient further reports:  -- continues self- managing medicationsand denies concerns/ recent changes around medications; reviewed with patient her medications for DM and confirmed that she is again able to accurately/ independently verbalize dosing for long- and short-acting insulin and endorses adherence to all medications as prescribed -- again reports ongoing weight loss, attended recent provider appointment with ENT specialist on January 25, 2020; reports lab work for thyroid function was completed; awaiting results.  Continues to not monitor weights at home, except "occasionally" -- has not had recent provider appointments otherwise; discussed and review last PCP appointment and encouraged patient to promptly schedule next appointment for follow up as advised and reviewed with her last documented A1-C; discussed importance of obtaining updated A1-C value -- reviewed recent blood sugars at home; patient continues monitoring and recording blood sugars QD (HS) and reports last night value of 125; states that she believes her blood  sugars have continued to improve; reports consistent post-prandial ranges between "120- 200."  Continues to report that she carefully watched her dietary intake, and essentially declines further education around dietary strategies for self-health management of DM -- has not been able to effectively stop smoking; declines further need for resources, and confirms that she has not started taking Chantix; reports "bad side effects" when she previously tried this medication and states she does not think she will try this medication again due to previously reported side effects.  Patient states that she is currently smoking "about half pack" cigarettes per day; declines need for ongoing resources for smoking cessation- reports, "I know what I need to do, I just can't seem to get motivated and do it;" encouragement provided to patient to continue her efforts and use previously provided reosurces  -- continues using established transportation resources; denies issues with transportation  -- again requests contact information for [facility] BSWwhom previously spoke to patient about community resources available to her for clothing; I again reviewed these resources with patient per [facility] CSW note dated 10/26/2019, and encouraged patient to contact [facility] BSW should she have ongoing questions/ needs and she verbalizes agreement  Patient denies further issues, concerns, or problems today. I confirmed that patient hasmy direct phone number, the main [facility] CM office phone number, and the [facility] CM 24-hour nurse advice phone number should issues arise prior to next scheduled [place] outreach by phone next month. Encouraged patient to contact me directly if needs, questions, issues, or concerns arise prior to next scheduled outreach; patient agreed to do so.  Plan:  Patient will take medications as prescribed and will attend all scheduled provider appointments  Patient will promptly notify care providers for any new  concerns/ issues/ problems that arise  Patient willcontinue her efforts to  decrease/ stop smoking  Patient will continue monitoring/and beginrecording dailyblood sugarsand limiting carb/ sugar intake  Patient will make PCP appointment for ongoing management of DM/ weight loss concerns/ A1-C monitoring  I will share today's [facility] CM note/ care plan with patient's PCP as quarterly summary  [place] outreach to continue with scheduled phone callnext month  [facility] CM Care Plan Problem Two     Most Recent Value  Care Plan Problem Two  Self-health management of chronic disease state of DM, as evidenced by patient reporting  Role Documenting the Problem Two  Care Management Coordinator  Care Plan for Problem Two  Active  Interventions for Problem Two Long Term Goal   Confirmed that patient continues monitoring and recording blood sugars at home daily (HS) and reviewed recently recorded blood sugars,  discussed blood sugar ranges with patient and A1-C trends over last 2 years,  reviewed medications for DM and confirmed that patient is able to verbalize acurate understanding of medications and dosing/ scheduling,  confirmed that there have been no recent changes to medications and that patient has no concerns around her medications  [facility] Long Term Goal  Over the next 60 days, patient will continue monitoring and begin recording daily blood sugars at home, as evidenced by patient reporting/ review of same during [facility] RN CM outreach  [facility] Long Term Goal Start Date  01/09/20  [facility] CM Short Term Goal #1   Over the next 30 days, patient will schedule an appointment with her PCP/ DM provider to have A1-C checked, as evidenced by patient reporting and review of EMR/ collaboration with PCP team as indicated during [facility] RN CM outreach  [facility] CM Short Term Goal #1 Start Date  02/01/20  Interventions for Short Term Goal #2   Reviewed with patient A1-C trends over last year and confirmed that she has accurate  understanding of her A1-C trends,  discussed value of having A1-C level checked promptly, given that her most recent values from 2020 were elvated > 10,  discussed correlation of blood sugars at home with A1-C values,  confirmed that patient continues 'watching diet" and reports having made changes to decrease blood sugar levels- positive reinforcement provided, and patient was encouraged to continue making dietary changes to incorporate into her ongoing dietary routines    [facility] CM Care Plan Problem Three     Most Recent Value  Care Plan Problem Three  Self-health management of chronic disease state of PAD/ PVD, in patient who continues to smoke, as evidenced by patient reporting  Role Documenting the Problem Three  Care Management [place] for Problem Three  Not Active  THN Long Term Goal   Over the next 60 days, patient will stop smoking as evidenced by patient reporting during [place] CM outreach  [facility] Long Term Goal Start Date  12/03/19  [facility] Long Term Goal Met Date  02/01/20 [Goal not met]  Interventions for Problem Three Long Term Goal  Discussed with patient her ongoing challenges around stopping smoking,  discussed use of prescribed Chantix and confirmed that patient has not started taking this medication and does not plan to start taking,  confirmed that patient reviewed resources previously provided to her around smoking cessation and declines further assistance,  encouraged patient to continue efforts to decrease/ stop smoking     Oneta Rack, RN, BSN, [employer] Coordinator [facility] Care Management  [phone]

## 2020-02-06 ENCOUNTER — Ambulatory Visit (INDEPENDENT_AMBULATORY_CARE_PROVIDER_SITE_OTHER): Payer: Medicare Other | Admitting: Internal Medicine

## 2020-02-06 ENCOUNTER — Other Ambulatory Visit: Payer: Self-pay

## 2020-02-06 ENCOUNTER — Encounter: Payer: Self-pay | Admitting: Internal Medicine

## 2020-02-06 VITALS — BP 141/64 | HR 75 | Temp 98.4°F | Wt 160.2 lb

## 2020-02-06 DIAGNOSIS — K5900 Constipation, unspecified: Secondary | ICD-10-CM

## 2020-02-06 DIAGNOSIS — Z720 Tobacco use: Secondary | ICD-10-CM | POA: Diagnosis not present

## 2020-02-06 DIAGNOSIS — Z6825 Body mass index (BMI) 25.0-25.9, adult: Secondary | ICD-10-CM

## 2020-02-06 DIAGNOSIS — E1165 Type 2 diabetes mellitus with hyperglycemia: Secondary | ICD-10-CM

## 2020-02-06 DIAGNOSIS — R634 Abnormal weight loss: Secondary | ICD-10-CM

## 2020-02-06 LAB — POCT GLYCOSYLATED HEMOGLOBIN (HGB A1C): Hemoglobin A1C: 6.5 % — AB (ref 4.0–5.6)

## 2020-02-06 LAB — GLUCOSE, CAPILLARY: Glucose-Capillary: 155 mg/dL — ABNORMAL HIGH (ref 70–99)

## 2020-02-06 NOTE — Patient Instructions (Signed)
Susan Holmes,   Thanks for seeing [place] today. As we discussed, uncontrolled diabetes can cause weight loss. In addition, the trulicity can cause decreased appetite.   I will check your A1C today and start low dose metformin.   Take care.

## 2020-02-06 NOTE — Assessment & Plan Note (Addendum)
Unintentional weight loss: Susan Holmes states that for the past 4 months she has had about 30 pound weight loss.  This morning she checked her weight at home and it was 148 pounds.  She also complains of night sweats and reports that she usually soaks her clothes at night.  In regards to her constipation, she has a bowel movement about 4 times per week and usually has to strain to have a bowel movement (some pellets and some regular).  She has been using stool softeners and laxatives.  She denies palpitation, fevers, chills, nausea, vomiting, headaches, vision changes, alcohol use, hematochezia, bright red blood per rectum or any family history of malignancy.  She also reports of intermittent daytime diaphoresis.  In the interim, she endorses decreased appetite and has been taking vitamins, proteins and Ensures.  She does have a prior history of tobacco use disorder and states that she has been smoking half a pack of cigarettes for more than 20 years.  2020-2021:  -Of note, she has had multiple blood work and imaging including normal PTH, calcium, TSH.  CBC showed unremarkable hemoglobin with an MCV of 77.  ESR was elevated at 78.  -CT chest without contrast was unremarkable without adenopathy or lung nodules  2019:  -Mammogram was unremarkable.  BI-RADS 1  2018: -Colonoscopy revealed sessile polyp with pathology showing tubular adenoma  She does have uncontrolled diabetes with her last A1c of 10.2%.  She tells me that her home CBGs are in the 140s.  She is currently on Lantus 24 units at night, Trulicity.  Previously she was on Lantus, Trulicity, Janumet which has a propensity to cause decreased appetite.  Assessment: She reports of unintentional weight loss, decreased appetite.  In February 2020 weight was 158 pounds>> weight today 160 pounds (home reported weight 148 pounds).  She has reassuring mammogram and colonoscopy.  CT chest without contrast was unremarkable.  Her decreased appetite could be  a side effect of Trulicity.  Her night sweats is puzzling and given her concern, she might warrant pan CT scan if indicated.  Given that she does not report of palpitation or episodic headaches, pheochromocytoma is less likely.  Plan: -Reassured patient about stable weight -Counseled patient about uncontrolled diabetes and the possibility of decreased appetite from Trulicity -K9X today -We will try and start low-dose Metformin or SGLT-2 inhibitor if A1c not improved -With her night sweats and her concern, she might warrant pan-CT scan -With elevated ESR, she might warrant autoimmune work-up   ADDENDUM: -Repeat A1C 6.5%

## 2020-02-06 NOTE — Progress Notes (Signed)
   CC: Unintentional weight loss  HPI:  Susan Holmes is a 64 y.o. with medical issues and below presenting for evaluation of unintentional weight loss, constipation and night sweats.  Please see problem based charting for further details.  Past Medical History:  Diagnosis Date  . Anxiety   . Arthritis   . Chest pain 09/26/2018  . Congenital blindness    Left  eye  . Coronary artery disease    per cardiac cath 2015-- mild disease involving LAD and branches ,  LCFx and branches, and mild disease pRCA  . Depression   . Diabetes mellitus, type II ([place])    followed by pcp  . Glaucoma   . Glaucoma, both eyes   . Hepatitis B infection pt unsure   resolved  . History of kidney stones    right stone  . History of transient ischemic attack (TIA) 2017   per pt no residuals  . HTN (hypertension)    followed by pcp  . Hyperlipidemia   . PAD (peripheral artery disease) ([place])   . Poor dental hygiene   . Stroke ([place])   . Tobacco abuse    .5 PPD smoker   Review of Systems:  As per HPI  Physical Exam:  Vitals:   02/06/20 0952  BP: (!) 141/64  Pulse: 75  Temp: 98.4 F (36.9 C)  TempSrc: Oral  SpO2: 99%  Weight: 160 lb 3.2 oz (72.7 kg)   Physical Exam  Constitutional: She is well-developed, well-nourished, and in no distress.  Cardiovascular: Normal rate and regular rhythm. Exam reveals no friction rub.  No murmur heard. Pulmonary/Chest: Effort normal and breath sounds normal.  Abdominal: Soft. Bowel sounds are normal. She exhibits no distension.  Musculoskeletal:        General: No edema.  Lymphadenopathy:       Head (right side): No preauricular and no posterior auricular adenopathy present.       Head (left side): No preauricular and no posterior auricular adenopathy present.    She has no cervical adenopathy.       Right cervical: No superficial cervical adenopathy present.      Left cervical: No superficial cervical adenopathy present.       Right: No inguinal  adenopathy present.       Left: No inguinal adenopathy present.    Assessment & Plan:   See Encounters Tab for problem based charting.  Patient discussed with Dr. Heber

## 2020-02-07 NOTE — Progress Notes (Signed)
Internal Medicine Clinic Attending  Case discussed with Dr. Agyei at the time of the visit.  We reviewed the resident's history and exam and pertinent patient test results.  I agree with the assessment, diagnosis, and plan of care documented in the resident's note.    

## 2020-02-16 ENCOUNTER — Other Ambulatory Visit: Payer: Self-pay | Admitting: Internal Medicine

## 2020-02-16 ENCOUNTER — Other Ambulatory Visit: Payer: Self-pay | Admitting: Cardiovascular Disease

## 2020-02-16 DIAGNOSIS — E1165 Type 2 diabetes mellitus with hyperglycemia: Secondary | ICD-10-CM

## 2020-02-17 ENCOUNTER — Other Ambulatory Visit: Payer: Self-pay | Admitting: Internal Medicine

## 2020-02-17 DIAGNOSIS — R0981 Nasal congestion: Secondary | ICD-10-CM

## 2020-02-17 DIAGNOSIS — I10 Essential (primary) hypertension: Secondary | ICD-10-CM

## 2020-02-17 DIAGNOSIS — E1165 Type 2 diabetes mellitus with hyperglycemia: Secondary | ICD-10-CM

## 2020-02-19 ENCOUNTER — Other Ambulatory Visit: Payer: Self-pay | Admitting: Internal Medicine

## 2020-02-19 NOTE — Telephone Encounter (Signed)
Also requesting refill on Celecoxib 100 mg.

## 2020-02-19 NOTE — Telephone Encounter (Signed)
Called pt to see why she needs a rx for Keflex; no answer, left message on self-identified vm to call the office.

## 2020-02-19 NOTE — Telephone Encounter (Signed)
OK to refill

## 2020-02-20 ENCOUNTER — Other Ambulatory Visit: Payer: Self-pay | Admitting: *Deleted

## 2020-02-20 DIAGNOSIS — R0981 Nasal congestion: Secondary | ICD-10-CM

## 2020-02-20 DIAGNOSIS — M542 Cervicalgia: Secondary | ICD-10-CM

## 2020-02-21 ENCOUNTER — Other Ambulatory Visit: Payer: Self-pay | Admitting: Internal Medicine

## 2020-02-21 DIAGNOSIS — M542 Cervicalgia: Secondary | ICD-10-CM

## 2020-02-21 MED ORDER — FLUTICASONE PROPIONATE 50 MCG/ACT NA SUSP: 1.0000 | Freq: Every day | NASAL | 1 refills | Status: DC

## 2020-02-21 MED ORDER — FLUTICASONE PROPIONATE 50 MCG/ACT NA SUSP: 1.0000 | Freq: Every day | NASAL | 2 refills | Status: DC

## 2020-02-21 MED ORDER — AMLODIPINE BESYLATE 10 MG PO TABS: 5.0000 mg | ORAL_TABLET | Freq: Every morning | ORAL | 1 refills | Status: DC

## 2020-02-21 MED ORDER — CELECOXIB 100 MG PO CAPS: 100.0000 mg | ORAL_CAPSULE | Freq: Two times a day (BID) | ORAL | 0 refills | Status: AC

## 2020-02-21 MED ORDER — CELECOXIB 100 MG PO CAPS: 100.0000 mg | ORAL_CAPSULE | Freq: Two times a day (BID) | ORAL | 0 refills | Status: DC

## 2020-02-27 ENCOUNTER — Telehealth: Payer: Self-pay

## 2020-02-27 NOTE — Telephone Encounter (Signed)
Pt called with c/o of swelling since surgery back in October 2020 and new onset of a "knot" in thigh region. She states this is on front of thigh and has no warmth or redness to it. It feels "heavy" at times and was aching yesterday. Scheduled her for ABI and arterial duplex per PA. Told her to call us if anything changes/worsens. Pt verbalized understanding.

## 2020-02-29 ENCOUNTER — Encounter: Payer: Self-pay | Admitting: *Deleted

## 2020-02-29 ENCOUNTER — Other Ambulatory Visit: Payer: Self-pay | Admitting: *Deleted

## 2020-02-29 NOTE — Patient Outreach (Signed)
[facility]) Care Management [place] Telephone Outreach Unsuccessful (consecutive) outreach attempt # 1- previously engaged patient  02/29/2020  [name] Feb 07, 1956 [id number]  Unsuccessful telephone outreach to Susan Holmes, 64 y/o female referred to [place] by [facility] Liaison after recent planned hospital visit October 26-27, 2020 for (L) femoral popliteal bypass due to PAD/ PVD.Patient was discharged home to self care without home health services.Patient has history including, but not limited to, PAD/PVD with claudication; HTN/ HLD; DM-II with insulin therapy; previous CVA; glaucoma; depression.  HIPAA compliant voice mail message left for patient, requesting return call back.  Plan:  Will re-attempt [place] telephone outreach within 4 business days if I do not hear back from patient first  Oneta Rack, RN, BSN, [employer] Coordinator [facility] Care Management  [phone]

## 2020-03-04 ENCOUNTER — Other Ambulatory Visit: Payer: Self-pay | Admitting: *Deleted

## 2020-03-04 NOTE — Patient Outreach (Signed)
[facility]) [place] Telephone Outreach- Case Closure Post-hospital discharge day # 132 without hospital readmission Transfer to [place] team embedded at PCP practice  03/04/2020  Susan Holmes March 21, 1956 [id number]  Successful telephone outreach to Susan Holmes, 64 y/o female referred to [place] by [facility] Liaison after recent planned hospital visit October 26-27, 2020 for (L) femoral popliteal bypass due to PAD/ PVD.Patient was discharged home to self care without home health services.Patient has history including, but not limited to, PAD/PVD with claudication; HTN/ HLD; DM-II with insulin therapy; previous CVA; glaucoma; depression.  HIPAA/ identity verified during phone call today. Patient reports that she has recently experienced a few days of increased pain in her (L) leg and stated that she has contacted her surgeon and obtained a follow up appointment to evaluate pain, scheduled for Thursday 03/06/20-- positive reinforcement provided for promptly contacting vascular surgeon when she started experiencing concerns.  Patient states the pain "was really bad a couple of days ago," and she declines quantifying pain levels today; states that "it has eased off" over the last few days.  Confirms that she has arranged transportation to upcoming appointment through established community transportation services already in place.  Patient sounds to be in no distress throughout phone call today, and she also reports that she has recently lost her "little sister" who passed away from an MI- condolences and emotional support provided.   Patient further reports:  -- continues self- managing medicationsand denies concerns/ recent changesaround medications;  -- attended recent PCP office visit 02/01/20: patient confirms that she had A1-C testing but reports that she is unaware of her latest A1-C results-- discussed with patient her decrease in  A1-C from 10.2 in October to 6.5 on 02/06/20!!! Positive reinforcement provided and patient was encouraged to celebrate this great reduction in her A1-C:  Using teach back method, reiterated previously provided education around significance of A1-C and correlation of A1-C results to blood sugar values at home -- confirmed that she discussed her concerns around weight loss with her PCP, who advised that her weight loss was not concerning and was actually good -- confirmed that she continues monitoring blood sugars at home but does not consistently record: given the excellent results of her latest A1-C I again encouraged patient to consider recording on paper, for her own empowerment in managing blood sugar trends: patient stated she will now consider doing this, as she was not expecting such great results and is very happy about it -- summarized with patient basics of previously provided education around self-health management of DM: encouraged patient to continue using her newly gained knowledge to continue making positive progress with her health -- has not yet been able to completely stop smoking-- reports still smoking "about 4 or 5 cigarettes" per day: encouraged patient to continue her efforts and discussed how smoking negatively affects DM/ health in general; patient states she will continue trying to stop, but "can't make any promises." -- given her sister's recent passing of MI, reiterated with patient previously provided education around signs/ symptoms MI/ CVA and corresponding ation plan for both- patient able to independently verbalize accurately  Patient denies further issues, concerns, or problems today. We discussed that patient has thus far made excellent progress in meeting her previously established [facility] CM goals; discussed with patient that her PCP office now has a [facility] Chronic CM team embedded in their office and available for her to work with around her  ongoing goals-- I encouraged her to  engage with embedded [facility] CCM team and patient is very receptive.  Plan:  Will close [facility] CM case, as patient has successfully met her previously established goals and will make patient's PCP aware of same  Will place referral with [facility] Chronic CM team embedded in patient's PCP office  [facility] CM Care Plan Problem Two     Most Recent Value  Care Plan Problem Two  Self-health management of chronic disease state of DM, as evidenced by patient reporting  Role Documenting the Problem Two  Care Management [place] for Problem Two  Not Active  Interventions for Problem Two Long Term Goal   Confirmed that patient continues monitoring blood sugars at home but has not started consistently recording- discussed with patient value of knowing specific blood sugar ranges to empower her behaviors and move forward with progress made,  placed referral to [facility] Chronic CM team embedded in PCP office visit  [facility] Term Goal  Over the next 60 days, patient will continue monitoring and begin recording daily blood sugars at home, as evidenced by patient reporting/ review of same during [facility] RN CM outreach  [facility] Long Term Goal Start Date  01/09/20  [facility] Long Term Goal Met Date  03/04/20 [Goal partially met]  [facility] CM Short Term Goal #1   Over the next 30 days, patient will schedule an appointment with her PCP/ DM provider to have A1-C checked, as evidenced by patient reporting and review of EMR/ collaboration with PCP team as indicated during [facility] RN CM outreach  [facility] CM Short Term Goal #1 Start Date  02/01/20  [facility] CM Short Term Goal #1 Met Date   03/04/20 [Goal met]  Interventions for Short Term Goal #2   Discussed with patient her most recent A1-C results post- recent PCP office visit,  using teachback method, correlated average blood sugar decrease around last 2 A1-C results- positive reinforcement provided to patient ,  summarized with patient previously provided education around maintaining lower blood sugars at  home and encouraged patient to continue using her knowledge to continue improving blood sugars     ----- Message ----- From: Knox Royalty, RN Sent: 03/05/2020   9:42 AM EST To: Clerance Lav, RN, Barrington Ellison, RN Subject: Referral for Chronic CM team                   Hubbard Robinson and Sherre Scarlet-- this is the patient that we briefly discussed in the office yesterday-- with the dramatic decrease in her most recent A1-C.... as you know-- I told her what an expert you are in DM and after being told her recent A1-C results-- I think she should engage with you without any issues-- she was understandably/ rightly very proud of her progress and seems very motivated to keep moving forward!  On short and long acting insulin and trulicity Still trying to quit smoking-- has PVD/ recent fem pop Had previously worked with Museum/gallery conservator around resources for clothing-- has established resources in place for community transportation  I am going to send you my case closure note as well- please call for any questions  Have a great day    It has been a pleasure caring for Susan Bowens, RN, BSN, [place] Care Management  [phone]

## 2020-03-05 ENCOUNTER — Telehealth (HOSPITAL_COMMUNITY): Payer: Self-pay

## 2020-03-05 NOTE — Telephone Encounter (Signed)

## 2020-03-06 ENCOUNTER — Ambulatory Visit (HOSPITAL_COMMUNITY)
Admission: RE | Admit: 2020-03-06 | Discharge: 2020-03-06 | Disposition: A | Discharge status: Home / Self Care | Payer: Medicare Other | Source: Ambulatory Visit | Attending: Vascular Surgery | Admitting: Vascular Surgery

## 2020-03-06 ENCOUNTER — Other Ambulatory Visit: Payer: Self-pay | Admitting: Vascular Surgery

## 2020-03-06 ENCOUNTER — Other Ambulatory Visit: Payer: Self-pay

## 2020-03-06 ENCOUNTER — Ambulatory Visit (INDEPENDENT_AMBULATORY_CARE_PROVIDER_SITE_OTHER): Payer: Medicare Other | Admitting: Physician Assistant

## 2020-03-06 ENCOUNTER — Ambulatory Visit (INDEPENDENT_AMBULATORY_CARE_PROVIDER_SITE_OTHER)
Admission: RE | Admit: 2020-03-06 | Discharge: 2020-03-06 | Disposition: A | Discharge status: Home / Self Care | Payer: Medicare Other | Source: Ambulatory Visit | Attending: Vascular Surgery | Admitting: Vascular Surgery

## 2020-03-06 VITALS — BP 175/101 | HR 71 | Temp 97.3°F | Resp 16 | Ht 67.0 in | Wt 158.0 lb

## 2020-03-06 DIAGNOSIS — I779 Disorder of arteries and arterioles, unspecified: Secondary | ICD-10-CM

## 2020-03-06 DIAGNOSIS — F17200 Nicotine dependence, unspecified, uncomplicated: Secondary | ICD-10-CM

## 2020-03-06 NOTE — Progress Notes (Signed)
HISTORY AND PHYSICAL     CC:  follow up. Requesting Provider:  [name], MD  HPI: This is a 64 y.o. female who is here today for follow up.  She has hx of left femoral to AK popliteal bypass grafting by Dr. Oneida Alar on 10/22/2019.  She was seen at her post op visit in November and at that time she had pain at the left groin incision.  She was seen back in December and at that time had a small dehiscence of the left groin incision of ~ 1cm but tissue was healthy and had some granulation tissues.  She had palpable DP pulses bilaterally.  She was still smoking at that point.  She was to follow up in a couple of weeks for a wound check but that appt did not happen.    The pt returns today for follow up with ABI's and arterial duplex.   She states that she does not have pain in her legs when walking or any pain in her feet, but does have left knee pain medially.  She states that she has taken Ibuprofen and it helps for a short period of time. She has not had x-rays or seen an orthopedic MD.  Says her left knee sometimes feels heavy.  She does not have any non healing wounds.  She continues to smoke and wants to quit.   The pt is on a statin for cholesterol management.    The pt is on an aspirin.    Other AC:  none The pt is on CCB, BB and ARB for hypertension.  The pt does have diabetes. Tobacco hx:  current   Past Medical History:  Diagnosis Date  . Anxiety   . Arthritis   . Chest pain 09/26/2018  . Congenital blindness    Left  eye  . Coronary artery disease    per cardiac cath 2015-- mild disease involving LAD and branches ,  LCFx and branches, and mild disease pRCA  . Depression   . Diabetes mellitus, type II ([place])    followed by pcp  . Glaucoma   . Glaucoma, both eyes   . Hepatitis B infection pt unsure   resolved  . History of kidney stones    right stone  . History of transient ischemic attack (TIA) 2017   per pt no residuals  . HTN (hypertension)    followed by pcp  .  Hyperlipidemia   . PAD (peripheral artery disease) ([place])   . Poor dental hygiene   . Stroke ([place])   . Tobacco abuse    .5 PPD smoker    Past Surgical History:  Procedure Laterality Date  . ABDOMINAL AORTOGRAM W/LOWER EXTREMITY N/A 09/14/2019   Procedure: ABDOMINAL AORTOGRAM W/LOWER EXTREMITY;  Surgeon: Elam Dutch, MD;  Location: [place] CV LAB;  Service: Cardiovascular;  Laterality: N/A;  . ABDOMINAL HYSTERECTOMY  1980s   unsure if ovaries were taken  . BYPASS GRAFT Left 10/22/2019    Left femoral to above-knee popliteal bypass using reversed ipsilateral greater saphenous vein  . colonoscopy    . FEMORAL-POPLITEAL BYPASS GRAFT Left 10/22/2019   Procedure: LEFT FEMORAL-ABOVE KNEE POPLITEAL  BYPASS GRAFT with REVERSED GREATER SAPHENOUS VEIN;  Surgeon: Elam Dutch, MD;  Location: [place];  Service: Vascular;  Laterality: Left;  . GLAUCOMA SURGERY Right 2017  . IR URETERAL STENT RIGHT NEW ACCESS W/O SEP NEPHROSTOMY CATH  10/03/2018  . LEFT HEART CATHETERIZATION WITH CORONARY ANGIOGRAM N/A 10/09/2014  Procedure: LEFT HEART CATHETERIZATION WITH CORONARY ANGIOGRAM;  Surgeon: Troy Sine, MD;  Location[facility] CATH LAB;  Service: Cardiovascular;  Laterality: N/A;  . NEPHROLITHOTOMY Right 10/03/2018   Procedure: RIGHT NEPHROLITHOTOMY PERCUTANEOUS FIRST STAGE;  Surgeon: Irine Seal, MD;  Location: [facility];  Service: Urology;  Laterality: Right;  . PERIPHERAL VASCULAR INTERVENTION Left 09/14/2019   Procedure: PERIPHERAL VASCULAR INTERVENTION;  Surgeon: Elam Dutch, MD;  Location: [place] [facility];  Service: Cardiovascular;  Laterality: Left;   left external iliac stent   . TEE WITHOUT CARDIOVERSION N/A 08/17/2016   Procedure: TRANSESOPHAGEAL ECHOCARDIOGRAM (TEE);  Surgeon: Sueanne Margarita, MD;  Location[facility] ENDOSCOPY;  Service: Cardiovascular;  Laterality: N/A;    Allergies  Allergen Reactions  . Ace Inhibitors Cough  . Lipitor [Atorvastatin] Other (See Comments)    MYALGIA    . Chantix [Varenicline] Nausea And Vomiting    Current Outpatient Medications  Medication Sig Dispense Refill  . Accu-Chek FastClix Lancets MISC CHECK BLOOD SUGAR THREE TIMES DAILY AS DIRECTED. DIAG CODE E 11.65. INSULIN DEPENDENT 306 each 1  . amLODipine (NORVASC) 10 MG tablet Take 0.5 tablets (5 mg total) by mouth every morning. 45 tablet 1  . aspirin EC 81 MG tablet Take 81 mg by mouth daily.    . Blood Glucose Monitoring Suppl (ACCU-CHEK GUIDE) w/Device KIT 1 each by Does not apply route 3 (three) times daily. 1 kit 0  . budesonide-formoterol (SYMBICORT) 160-4.5 MCG/ACT inhaler Inhale 2 puffs into the lungs 2 (two) times daily as needed. (Patient taking differently: Inhale 2 puffs into the lungs 2 (two) times daily as needed (respiratory issues.). ) 2 Inhaler 0  . busPIRone (BUSPAR) 7.5 MG tablet Take 7.5 mg by mouth 3 (three) times daily.    . carvedilol (COREG) 12.5 MG tablet TAKE 1 TABLET(12.5 MG) BY MOUTH TWICE DAILY (Patient taking differently: Take 12.5 mg by mouth 2 (two) times daily. ) 60 tablet 6  . CHANTIX 0.5 MG tablet TAKE 1 TABLET(0.5 MG) BY MOUTH TWICE DAILY 60 tablet 0  . chlorthalidone (HYGROTON) 25 MG tablet TAKE 1/2 TABLET BY MOUTH DAILY (Patient taking differently: Take 12.5 mg by mouth daily. ) 45 tablet 3  . cyproheptadine (PERIACTIN) 4 MG tablet Take 4 mg by mouth 2 (two) times daily.    . dorzolamide-timolol (COSOPT) 22.3-6.8 MG/ML ophthalmic solution Place 1 drop into both eyes every evening.     . DULoxetine (CYMBALTA) 30 MG capsule TAKE 1 CAPSULE(30 MG) BY MOUTH DAILY 90 capsule 2  . Ensure (ENSURE) Take 1 Can by mouth 2 (two) times daily between meals. 14220 mL 12  . fluticasone (FLONASE) 50 MCG/ACT nasal spray Place 1 spray into both nostrils daily. 9.9 mL 2  . fluticasone (FLONASE) 50 MCG/ACT nasal spray Place 1 spray into both nostrils daily. 16 g 1  . hydrOXYzine (ATARAX/VISTARIL) 25 MG tablet Take 25 mg by mouth 3 (three) times daily as needed for anxiety.      . Insulin Glargine (LANTUS SOLOSTAR) 100 UNIT/ML Solostar Pen Inject 28 Units into the skin at bedtime. 5 pen 5  . insulin lispro (HUMALOG KWIKPEN) 100 UNIT/ML KwikPen Inject 0.05 mLs (5 Units total) into the skin 3 (three) times daily with meals. 15 mL 11  . Insulin Pen Needle (VALUMARK PEN NEEDLES) 31G X 8 MM MISC 1 each by Does not apply route daily. 100 each 3  . losartan (COZAAR) 100 MG tablet Take 1 tablet (100 mg total) by mouth daily. 90 tablet 1  .  methocarbamol (ROBAXIN) 500 MG tablet Take 2 tablets (1,000 mg total) by mouth 4 (four) times daily. 30 tablet 0  . mirtazapine (REMERON) 45 MG tablet Take 45 mg by mouth at bedtime.    . Multiple Vitamin (MULTIVITAMIN WITH MINERALS) TABS tablet Take 1 tablet by mouth daily.    Glory Rosebush ULTRA test strip USE TO TEST BLOOD GLUCOSE THREE TIMES DAILY 100 strip 12  . pantoprazole (PROTONIX) 40 MG tablet Take 40 mg by mouth daily.    . prednisoLONE acetate (PRED FORTE) 1 % ophthalmic suspension Place 1 drop into the right eye daily.    . rosuvastatin (CRESTOR) 20 MG tablet Take 1 tablet (20 mg total) by mouth at bedtime. 30 tablet 6  . traZODone (DESYREL) 100 MG tablet Take 300 mg by mouth at bedtime.     . TRULICITY 1.5 SX/1.1BZ SOPN ADMINISTER 1.5 MG UNDER THE SKIN EVERY SATURDAY 6 mL 1   No current facility-administered medications for this visit.    Family History  Problem Relation Age of Onset  . Heart disease Mother 60  . Hypertension Mother   . CVA Mother   . Heart disease Father 78  . CVA Sister   . CVA Brother   . Aneurysm Brother   . Aneurysm Sister   . CVA Maternal Grandmother   . CVA Maternal Grandfather   . Colon cancer Neg Hx   . Esophageal cancer Neg Hx   . Stomach cancer Neg Hx     Social History   Socioeconomic History  . Marital status: Divorced    Spouse name: Not on file  . Number of children: 1  . Years of education: 1  . Highest education level: Not on file  Occupational History    Employer:  UNEMPLOYED  Tobacco Use  . Smoking status: Current Every Day Smoker    Packs/day: 0.50    Years: 39.00    Pack years: 19.50    Types: Cigarettes    Start date: 21  . Smokeless tobacco: Current User    Types: Chew  . Tobacco comment: half pack per day per patient report 10/25/2019  Substance and Sexual Activity  . Alcohol use: No    Alcohol/week: 0.0 standard drinks  . Drug use: No  . Sexual activity: Not Currently    Birth control/protection: Surgical  Other Topics Concern  . Not on file  Social History Narrative  . Not on file   Social Determinants of Health   Financial Resource Strain:   . Difficulty of Paying Living Expenses:   Food Insecurity: Food Insecurity Present  . Worried About Charity fundraiser in the Last Year: Sometimes true  . Ran Out of Food in the Last Year: Never true  Transportation Needs: No Transportation Needs  . Lack of Transportation (Medical): No  . Lack of Transportation (Non-Medical): No  Physical Activity:   . Days of Exercise per Week:   . Minutes of Exercise per Session:   Stress:   . Feeling of Stress :   Social Connections:   . Frequency of Communication with Friends and Family:   . Frequency of Social Gatherings with Friends and Family:   . Attends Religious Services:   . Active Member of Clubs or Organizations:   . Attends Archivist Meetings:   Marland Kitchen Marital Status:   Intimate Partner Violence:   . Fear of Current or Ex-Partner:   . Emotionally Abused:   Marland Kitchen Physically Abused:   . Sexually Abused:  REVIEW OF SYSTEMS:   '[X]'  denotes positive finding, '[ ]'  denotes negative finding Cardiac  Comments:  Chest pain or chest pressure:    Shortness of breath upon exertion:    Short of breath when lying flat:    Irregular heart rhythm:        Vascular    Pain in calf, thigh, or hip brought on by ambulation: x   Pain in feet at night that wakes you up from your sleep:     Blood clot in your veins:    Leg swelling:  x         Pulmonary    Oxygen at home:    Productive cough:     Wheezing:         Neurologic    Sudden weakness in arms or legs:     Sudden numbness in arms or legs:     Sudden onset of difficulty speaking or slurred speech:    Temporary loss of vision in one eye:     Problems with dizziness:         Gastrointestinal    Blood in stool:     Vomited blood:         Genitourinary    Burning when urinating:     Blood in urine:        Psychiatric    Major depression:         Hematologic    Bleeding problems:    Problems with blood clotting too easily:        Skin    Rashes or ulcers:        Constitutional    Fever or chills:      PHYSICAL EXAMINATION:  Today's Vitals   03/06/20 1434  BP: (!) 175/101  Pulse: 71  Resp: 16  Temp: (!) 97.3 F (36.3 C)  TempSrc: Temporal  SpO2: 98%  Weight: 158 lb (71.7 kg)  Height: '5\' 7"'  (1.702 m)  PainSc: 8    Body mass index is 24.75 kg/m.   General:  WDWN in NAD; vital signs documented above Gait: Not observed HENT: WNL, normocephalic Pulmonary: normal non-labored breathing , without Rales, rhonchi,  wheezing Cardiac: regular HR, without  Murmurs; without carotid bruits Abdomen: soft, NT, no masses Skin: without rashes; all incisions on LLE are healed. Vascular Exam/Pulses:  Right Left  Radial 2+ (normal) 2+ (normal)  Ulnar 2+ (normal) 2+ (normal)  Femoral 2+ (normal) 2+ (normal)  Popliteal Unable to palpate  Unable to palpate   DP 2+ (normal) 2+ (normal)  PT 2+ (normal) Unable to palpate    Extremities: without ischemic changes, without Gangrene , without cellulitis; without open wounds; tenderness to palpation of left knee medially.  Do not appreciate any fluid around the knee. Palpation to thighs are symmetrical.  Musculoskeletal: no muscle wasting or atrophy  Neurologic: A&O X 3;  No focal weakness or paresthesias are detected Psychiatric:  The pt has Normal affect.   Non-Invasive Vascular Imaging:    ABI's/TBI's on 03/06/2020: Right:  1.11/0.95 - Great toe pressure: 184 Left:  1.12/1.01 - Great toe pressure: 194   Arterial duplex LLE on 03/06/2020: Summary:  Left: Patent graft with no evidence of stenosis.  Previous ABI's/TBI's on 11/08/2019: Right:  0.97/1.12 - Great toe pressure: 152 Left:  0.97/0.93 - Great toe pressure:  127   ASSESSMENT/PLAN:: 64 y.o. female here for follow up for  left femoral to AK popliteal bypass grafting by Dr. Oneida Alar on 10/22/2019  -pt with  patent LLE bypass and normal ABI's with palpable pedal pulses.  She does have c/o left knee pain.  Will refer to orthopedics for further evaluation. -will have pt return in 9 months with repeat ABI's and LLE arterial duplex.  She will call sooner if she has any issues with rest pain or non healing wounds.  -I did discuss the importance of smoking cessation with pt.    Leontine Locket, PA-C [facility] [phone]  Clinic MD:   Oneida Alar

## 2020-03-07 ENCOUNTER — Other Ambulatory Visit: Payer: Self-pay | Admitting: Internal Medicine

## 2020-03-07 ENCOUNTER — Other Ambulatory Visit: Payer: Self-pay | Admitting: *Deleted

## 2020-03-07 DIAGNOSIS — I779 Disorder of arteries and arterioles, unspecified: Secondary | ICD-10-CM

## 2020-03-07 DIAGNOSIS — K5900 Constipation, unspecified: Secondary | ICD-10-CM | POA: Diagnosis not present

## 2020-03-07 DIAGNOSIS — I739 Peripheral vascular disease, unspecified: Secondary | ICD-10-CM

## 2020-03-07 DIAGNOSIS — I10 Essential (primary) hypertension: Secondary | ICD-10-CM

## 2020-03-07 DIAGNOSIS — R140 Abdominal distension (gaseous): Secondary | ICD-10-CM | POA: Diagnosis not present

## 2020-03-07 DIAGNOSIS — E119 Type 2 diabetes mellitus without complications: Secondary | ICD-10-CM

## 2020-03-07 DIAGNOSIS — Z86010 Personal history of colonic polyps: Secondary | ICD-10-CM | POA: Diagnosis not present

## 2020-03-10 ENCOUNTER — Other Ambulatory Visit: Payer: Self-pay | Admitting: Gastroenterology

## 2020-03-10 ENCOUNTER — Ambulatory Visit
Admission: RE | Admit: 2020-03-10 | Discharge: 2020-03-10 | Disposition: A | Discharge status: Home / Self Care | Payer: Medicare Other | Source: Ambulatory Visit | Attending: Gastroenterology | Admitting: Gastroenterology

## 2020-03-10 DIAGNOSIS — K5909 Other constipation: Secondary | ICD-10-CM

## 2020-03-10 DIAGNOSIS — K5900 Constipation, unspecified: Secondary | ICD-10-CM | POA: Diagnosis not present

## 2020-03-14 ENCOUNTER — Ambulatory Visit: Payer: Self-pay | Admitting: *Deleted

## 2020-03-14 DIAGNOSIS — E1165 Type 2 diabetes mellitus with hyperglycemia: Secondary | ICD-10-CM

## 2020-03-14 DIAGNOSIS — I10 Essential (primary) hypertension: Secondary | ICD-10-CM

## 2020-03-14 NOTE — Chronic Care Management (AMB) (Signed)
  Chronic Care Management   Note  03/14/2020 Name: [name] MRN: [id number] DOB: Aug 07, 1956   Successful outreach to patient to explain chronic care management program. She agrees to services but requests call in the afternoon next week on Tuesday or Wednesday afternoon.   Follow up plan: Telephone follow up appointment with care management team member scheduled for:03/19/20 at 3:00 pm.  Kelli Churn RN, CCM, [facility] RN Care Manager [phone]

## 2020-03-17 ENCOUNTER — Ambulatory Visit: Payer: Medicare Other | Admitting: Podiatry

## 2020-03-17 ENCOUNTER — Ambulatory Visit (INDEPENDENT_AMBULATORY_CARE_PROVIDER_SITE_OTHER): Payer: Medicare Other | Admitting: Podiatry

## 2020-03-17 ENCOUNTER — Encounter: Payer: Self-pay | Admitting: Podiatry

## 2020-03-17 ENCOUNTER — Other Ambulatory Visit: Payer: Self-pay

## 2020-03-17 VITALS — Temp 96.6°F

## 2020-03-17 DIAGNOSIS — M2042 Other hammer toe(s) (acquired), left foot: Secondary | ICD-10-CM | POA: Diagnosis not present

## 2020-03-17 DIAGNOSIS — B351 Tinea unguium: Secondary | ICD-10-CM

## 2020-03-17 DIAGNOSIS — E559 Vitamin D deficiency, unspecified: Secondary | ICD-10-CM | POA: Diagnosis not present

## 2020-03-17 DIAGNOSIS — E46 Unspecified protein-calorie malnutrition: Secondary | ICD-10-CM | POA: Diagnosis not present

## 2020-03-17 DIAGNOSIS — E1151 Type 2 diabetes mellitus with diabetic peripheral angiopathy without gangrene: Secondary | ICD-10-CM

## 2020-03-17 DIAGNOSIS — M8949 Other hypertrophic osteoarthropathy, multiple sites: Secondary | ICD-10-CM | POA: Diagnosis not present

## 2020-03-17 DIAGNOSIS — E11630 Type 2 diabetes mellitus with periodontal disease: Secondary | ICD-10-CM | POA: Diagnosis not present

## 2020-03-17 DIAGNOSIS — E119 Type 2 diabetes mellitus without complications: Secondary | ICD-10-CM | POA: Diagnosis not present

## 2020-03-17 DIAGNOSIS — L84 Corns and callosities: Secondary | ICD-10-CM | POA: Diagnosis not present

## 2020-03-17 DIAGNOSIS — M79675 Pain in left toe(s): Secondary | ICD-10-CM | POA: Diagnosis not present

## 2020-03-17 DIAGNOSIS — M2041 Other hammer toe(s) (acquired), right foot: Secondary | ICD-10-CM

## 2020-03-17 DIAGNOSIS — Z79899 Other long term (current) drug therapy: Secondary | ICD-10-CM | POA: Diagnosis not present

## 2020-03-17 DIAGNOSIS — M79674 Pain in right toe(s): Secondary | ICD-10-CM | POA: Diagnosis not present

## 2020-03-17 DIAGNOSIS — D649 Anemia, unspecified: Secondary | ICD-10-CM | POA: Diagnosis not present

## 2020-03-17 DIAGNOSIS — I119 Hypertensive heart disease without heart failure: Secondary | ICD-10-CM | POA: Diagnosis not present

## 2020-03-17 NOTE — Patient Instructions (Signed)
Diabetes Mellitus and Foot Care Foot care is an important part of your health, especially when you have diabetes. Diabetes may cause you to have problems because of poor blood flow (circulation) to your feet and legs, which can cause your skin to:  Become thinner and drier.  Break more easily.  Heal more slowly.  Peel and crack. You may also have nerve damage (neuropathy) in your legs and feet, causing decreased feeling in them. This means that you may not notice minor injuries to your feet that could lead to more serious problems. Noticing and addressing any potential problems early is the best way to prevent future foot problems. How to care for your feet Foot hygiene  Wash your feet daily with warm water and mild soap. Do not use hot water. Then, pat your feet and the areas between your toes until they are completely dry. Do not soak your feet as this can dry your skin.  Trim your toenails straight across. Do not dig under them or around the cuticle. File the edges of your nails with an emery board or nail file.  Apply a moisturizing lotion or petroleum jelly to the skin on your feet and to dry, brittle toenails. Use lotion that does not contain alcohol and is unscented. Do not apply lotion between your toes. Shoes and socks  Wear clean socks or stockings every day. Make sure they are not too tight. Do not wear knee-high stockings since they may decrease blood flow to your legs.  Wear shoes that fit properly and have enough cushioning. Always look in your shoes before you put them on to be sure there are no objects inside.  To break in new shoes, wear them for just a few hours a day. This prevents injuries on your feet. Wounds, scrapes, corns, and calluses  Check your feet daily for blisters, cuts, bruises, sores, and redness. If you cannot see the bottom of your feet, use a mirror or ask someone for help.  Do not cut corns or calluses or try to remove them with medicine.  If you  find a minor scrape, cut, or break in the skin on your feet, keep it and the skin around it clean and dry. You may clean these areas with mild soap and water. Do not clean the area with peroxide, alcohol, or iodine.  If you have a wound, scrape, corn, or callus on your foot, look at it several times a day to make sure it is healing and not infected. Check for: ? Redness, swelling, or pain. ? Fluid or blood. ? Warmth. ? Pus or a bad smell. General instructions  Do not cross your legs. This may decrease blood flow to your feet.  Do not use heating pads or hot water bottles on your feet. They may burn your skin. If you have lost feeling in your feet or legs, you may not know this is happening until it is too late.  Protect your feet from hot and cold by wearing shoes, such as at the beach or on hot pavement.  Schedule a complete foot exam at least once a year (annually) or more often if you have foot problems. If you have foot problems, report any cuts, sores, or bruises to your health care provider immediately. Contact a health care provider if:  You have a medical condition that increases your risk of infection and you have any cuts, sores, or bruises on your feet.  You have an injury that is not   healing.  You have redness on your legs or feet.  You feel burning or tingling in your legs or feet.  You have pain or cramps in your legs and feet.  Your legs or feet are numb.  Your feet always feel cold.  You have pain around a toenail. Get help right away if:  You have a wound, scrape, corn, or callus on your foot and: ? You have pain, swelling, or redness that gets worse. ? You have fluid or blood coming from the wound, scrape, corn, or callus. ? Your wound, scrape, corn, or callus feels warm to the touch. ? You have pus or a bad smell coming from the wound, scrape, corn, or callus. ? You have a fever. ? You have a red line going up your leg. Summary  Check your feet every day  for cuts, sores, red spots, swelling, and blisters.  Moisturize feet and legs daily.  Wear shoes that fit properly and have enough cushioning.  If you have foot problems, report any cuts, sores, or bruises to your health care provider immediately.  Schedule a complete foot exam at least once a year (annually) or more often if you have foot problems. This information is not intended to replace advice given to you by your health care provider. Make sure you discuss any questions you have with your health care provider. Document Revised: 09/05/2019 Document Reviewed: 01/14/2017 Elsevier Patient Education  2020 [employer] Inc[employer]  

## 2020-03-19 ENCOUNTER — Ambulatory Visit: Payer: Self-pay | Admitting: *Deleted

## 2020-03-19 ENCOUNTER — Telehealth: Payer: Medicare Other | Admitting: *Deleted

## 2020-03-19 DIAGNOSIS — I10 Essential (primary) hypertension: Secondary | ICD-10-CM

## 2020-03-19 DIAGNOSIS — E1165 Type 2 diabetes mellitus with hyperglycemia: Secondary | ICD-10-CM

## 2020-03-19 NOTE — Chronic Care Management (AMB) (Signed)
  Chronic Care Management   Note  03/19/2020 Name: Susan Holmes MRN: [id number] DOB: 1956/03/05  Called [name] to complete initial intake that was scheduled for today and she requested this RNCM call back in one week at the same time.  Follow up plan: Telephone follow up appointment with care management team member scheduled for:03/26/20 at 3:00 pm.  Kelli Churn RN, CCM, [facility] RN Care Manager [phone]

## 2020-03-20 ENCOUNTER — Encounter: Payer: Self-pay | Admitting: Orthopedic Surgery

## 2020-03-20 ENCOUNTER — Ambulatory Visit (INDEPENDENT_AMBULATORY_CARE_PROVIDER_SITE_OTHER): Payer: Medicare Other | Admitting: Orthopedic Surgery

## 2020-03-20 ENCOUNTER — Other Ambulatory Visit: Payer: Self-pay

## 2020-03-20 ENCOUNTER — Ambulatory Visit: Payer: Self-pay

## 2020-03-20 DIAGNOSIS — M1712 Unilateral primary osteoarthritis, left knee: Secondary | ICD-10-CM | POA: Diagnosis not present

## 2020-03-20 DIAGNOSIS — M25562 Pain in left knee: Secondary | ICD-10-CM

## 2020-03-20 NOTE — Progress Notes (Signed)
Subjective: Susan Holmes presents today for preventative diabetic foot care, for diabetic foot evaluation and callus(es) b/l feet and painful mycotic toenails b/l that are difficult to trim. Pain interferes with ambulation. Aggravating factors include wearing enclosed shoe gear. Pain is relieved with periodic professional debridement.  Past Medical History:  Diagnosis Date  . Anxiety   . Arthritis   . Chest pain 09/26/2018  . Congenital blindness    Left  eye  . Coronary artery disease    per cardiac cath 2015-- mild disease involving LAD and branches ,  LCFx and branches, and mild disease pRCA  . Depression   . Diabetes mellitus, type II ([place])    followed by pcp  . Glaucoma   . Glaucoma, both eyes   . Hepatitis B infection pt unsure   resolved  . History of kidney stones    right stone  . History of transient ischemic attack (TIA) 2017   per pt no residuals  . HTN (hypertension)    followed by pcp  . Hyperlipidemia   . PAD (peripheral artery disease) ([place])   . Poor dental hygiene   . Stroke ([place])   . Tobacco abuse    .5 PPD smoker    Patient Active Problem List   Diagnosis Date Noted  . Neck pain on left side 12/17/2019  . PAD (peripheral artery disease) ([place]) 10/22/2019  . Sinus congestion 10/11/2019  . PVD (peripheral vascular disease) ([place]) 10/04/2019  . Insulin dependent type 2 diabetes mellitus ([place]) 10/04/2019  . Claudication ([place]) 09/14/2019  . Left foot pain 07/12/2019  . Paronychia of great toe, left 06/21/2019  . Orthostatic hypotension 02/15/2019  . Diaphoresis 02/15/2019  . Nephrolithiasis 10/03/2018  . Left chest pressure 09/26/2018  . Upper back pain on right side 09/19/2018  . Callus of foot 09/19/2018  . Reactive airway disease 06/20/2018  . Unintentional weight loss 06/14/2018  . History of vitamin D deficiency 09/06/2017  . Forgetfulness 06/07/2017  . Dyspepsia 04/26/2017  . History of CVA (cerebrovascular accident) 07/23/2016  . Cough  07/08/2016  . Lower back pain 08/06/2015  . At risk for polypharmacy 08/06/2015  . Osteoarthritis 12/11/2014  . History of chest pain 10/07/2014  . Pre-operative cardiovascular examination 10/07/2014  . Diarrhea 04/04/2014  . Liver hemangioma 04/04/2014  . RBC microcytosis 02/05/2014  . Hyperparathyroidism ([place]) 03/08/2013  . Calcific tendinitis of right shoulder 02/26/2012  . Fatigue 12/14/2011  . Uncontrolled type II diabetes mellitus ([place]) 06/12/2007  . Hyperlipidemia associated with type 2 diabetes mellitus ([place]) 06/12/2007  . ANXIETY 06/12/2007  . TOBACCO ABUSE 06/12/2007  . Depression 06/12/2007  . CONGENITAL BLINDNESS, LEFT EYE 06/12/2007  . Essential hypertension 06/12/2007    Past Surgical History:  Procedure Laterality Date  . ABDOMINAL AORTOGRAM W/LOWER EXTREMITY N/A 09/14/2019   Procedure: ABDOMINAL AORTOGRAM W/LOWER EXTREMITY;  Surgeon: Elam Dutch, MD;  Location: [facility];  Service: Cardiovascular;  Laterality: N/A;  . ABDOMINAL HYSTERECTOMY  1980s   unsure if ovaries were taken  . BYPASS GRAFT Left 10/22/2019    Left femoral to above-knee popliteal bypass using reversed ipsilateral greater saphenous vein  . colonoscopy    . FEMORAL-POPLITEAL BYPASS GRAFT Left 10/22/2019   Procedure: LEFT FEMORAL-ABOVE KNEE POPLITEAL  BYPASS GRAFT with REVERSED GREATER SAPHENOUS VEIN;  Surgeon: Elam Dutch, MD;  Location: [facility];  Service: Vascular;  Laterality: Left;  . GLAUCOMA SURGERY Right 2017  . IR URETERAL STENT RIGHT NEW ACCESS W/O SEP NEPHROSTOMY CATH  10/03/2018  . LEFT HEART CATHETERIZATION WITH CORONARY ANGIOGRAM N/A 10/09/2014   Procedure: LEFT HEART CATHETERIZATION WITH CORONARY ANGIOGRAM;  Surgeon: Troy Sine, MD;  Location: [facility];  Service: Cardiovascular;  Laterality: N/A;  . NEPHROLITHOTOMY Right 10/03/2018   Procedure: RIGHT NEPHROLITHOTOMY PERCUTANEOUS FIRST STAGE;  Surgeon: Irine Seal, MD;  Location: [facility];  Service: Urology;   Laterality: Right;  . PERIPHERAL VASCULAR INTERVENTION Left 09/14/2019   Procedure: PERIPHERAL VASCULAR INTERVENTION;  Surgeon: Elam Dutch, MD;  Location: [facility];  Service: Cardiovascular;  Laterality: Left;   left external iliac stent   . TEE WITHOUT CARDIOVERSION N/A 08/17/2016   Procedure: TRANSESOPHAGEAL ECHOCARDIOGRAM (TEE);  Surgeon: Sueanne Margarita, MD;  Location: [facility] ENDOSCOPY;  Service: Cardiovascular;  Laterality: N/A;    Current Outpatient Medications on File Prior to Visit  Medication Sig Dispense Refill  . Accu-Chek FastClix Lancets MISC CHECK BLOOD SUGAR THREE TIMES DAILY AS DIRECTED. 306 each 1  . amLODipine (NORVASC) 10 MG tablet Take 0.5 tablets (5 mg total) by mouth every morning. 45 tablet 1  . aspirin EC 81 MG tablet Take 81 mg by mouth daily.    . Blood Glucose Monitoring Suppl (ACCU-CHEK GUIDE) w/Device KIT 1 each by Does not apply route 3 (three) times daily. 1 kit 0  . budesonide-formoterol (SYMBICORT) 160-4.5 MCG/ACT inhaler Inhale 2 puffs into the lungs 2 (two) times daily as needed. (Patient taking differently: Inhale 2 puffs into the lungs 2 (two) times daily as needed (respiratory issues.). ) 2 Inhaler 0  . busPIRone (BUSPAR) 7.5 MG tablet Take 7.5 mg by mouth 3 (three) times daily.    . carvedilol (COREG) 12.5 MG tablet TAKE 1 TABLET(12.5 MG) BY MOUTH TWICE DAILY (Patient taking differently: Take 12.5 mg by mouth 2 (two) times daily. ) 60 tablet 6  . CHANTIX 0.5 MG tablet TAKE 1 TABLET(0.5 MG) BY MOUTH TWICE DAILY 60 tablet 0  . chlorthalidone (HYGROTON) 25 MG tablet TAKE 1/2 TABLET BY MOUTH DAILY (Patient taking differently: Take 12.5 mg by mouth daily. ) 45 tablet 3  . cyproheptadine (PERIACTIN) 4 MG tablet Take 4 mg by mouth 2 (two) times daily.    . diclofenac Sodium (VOLTAREN) 1 % GEL     . dorzolamide-timolol (COSOPT) 22.3-6.8 MG/ML ophthalmic solution Place 1 drop into both eyes every evening.     . DULoxetine (CYMBALTA) 30 MG capsule TAKE 1  CAPSULE(30 MG) BY MOUTH DAILY 90 capsule 2  . Ensure (ENSURE) Take 1 Can by mouth 2 (two) times daily between meals. 14220 mL 12  . fluticasone (FLONASE) 50 MCG/ACT nasal spray Place 1 spray into both nostrils daily. 9.9 mL 2  . fluticasone (FLONASE) 50 MCG/ACT nasal spray Place 1 spray into both nostrils daily. 16 g 1  . hydrOXYzine (ATARAX/VISTARIL) 25 MG tablet Take 25 mg by mouth 3 (three) times daily as needed for anxiety.    Marland Kitchen ibuprofen (ADVIL) 800 MG tablet     . Insulin Glargine (LANTUS SOLOSTAR) 100 UNIT/ML Solostar Pen Inject 28 Units into the skin at bedtime. 5 pen 5  . insulin lispro (HUMALOG KWIKPEN) 100 UNIT/ML KwikPen Inject 0.05 mLs (5 Units total) into the skin 3 (three) times daily with meals. 15 mL 11  . Insulin Pen Needle (VALUMARK PEN NEEDLES) 31G X 8 MM MISC 1 each by Does not apply route daily. 100 each 3  . losartan (COZAAR) 100 MG tablet TAKE 1 TABLET(100 MG) BY MOUTH DAILY 90 tablet 1  .  methocarbamol (ROBAXIN) 500 MG tablet Take 2 tablets (1,000 mg total) by mouth 4 (four) times daily. 30 tablet 0  . mirtazapine (REMERON) 45 MG tablet Take 45 mg by mouth at bedtime.    . Multiple Vitamin (MULTIVITAMIN WITH MINERALS) TABS tablet Take 1 tablet by mouth daily.    Glory Rosebush ULTRA test strip USE TO TEST BLOOD GLUCOSE THREE TIMES DAILY 100 strip 12  . pantoprazole (PROTONIX) 40 MG tablet Take 40 mg by mouth daily.    . prednisoLONE acetate (PRED FORTE) 1 % ophthalmic suspension Place 1 drop into the right eye daily.    . rosuvastatin (CRESTOR) 20 MG tablet Take 1 tablet (20 mg total) by mouth at bedtime. 30 tablet 6  . traZODone (DESYREL) 100 MG tablet Take 300 mg by mouth at bedtime.     . TRULICITY 1.5 YQ/6.5HQ SOPN ADMINISTER 1.5 MG UNDER THE SKIN EVERY SATURDAY 6 mL 1   No current facility-administered medications on file prior to visit.     Allergies  Allergen Reactions  . Ace Inhibitors Cough  . Lipitor [Atorvastatin] Other (See Comments)    MYALGIA   .  Chantix [Varenicline] Nausea And Vomiting    Social History   Occupational History    Employer: UNEMPLOYED  Tobacco Use  . Smoking status: Current Every Day Smoker    Packs/day: 0.50    Years: 39.00    Pack years: 19.50    Types: Cigarettes    Start date: 68  . Smokeless tobacco: Current User    Types: Chew  . Tobacco comment: half pack per day per patient report 10/25/2019  Substance and Sexual Activity  . Alcohol use: No    Alcohol/week: 0.0 standard drinks  . Drug use: No  . Sexual activity: Not Currently    Birth control/protection: Surgical    Family History  Problem Relation Age of Onset  . Heart disease Mother 71  . Hypertension Mother   . CVA Mother   . Heart disease Father 74  . CVA Sister   . CVA Brother   . Aneurysm Brother   . Aneurysm Sister   . CVA Maternal Grandmother   . CVA Maternal Grandfather   . Colon cancer Neg Hx   . Esophageal cancer Neg Hx   . Stomach cancer Neg Hx     Immunization History  Administered Date(s) Administered  . Influenza Whole 01/27/2007  . Influenza, Seasonal, Injecte, Preservative Fre 12/01/2012  . Influenza,inj,Quad PF,6+ Mos 11/08/2014, 10/06/2015, 10/26/2016, 09/06/2017, 10/10/2019  . Influenza-Unspecified 10/27/2018  . Pneumococcal Polysaccharide-23 06/28/2005  . Td 02/25/2004  . Tdap 06/13/2018     Objective: Vitals:   03/17/20 1109  Temp: (!) 96.6 F (35.9 C)    Susan Holmes is a/an 64 y.o. female  IN NAD. AAO X 3.  Capillary refill time to digits immediate b/l. Palpable DP pulses b/l. Nonpalpable PT pulses b/l. Pedal hair absent b/l Skin temperature gradient within normal limits b/l.  Pedal skin is thin shiny, atrophic bilaterally. No open wounds bilaterally. No interdigital macerations bilaterally. Toenails 2-5 bilaterally, submet head 2 right foot, submet head 4 left foot and submet head 5 right foot elongated, dystrophic, thickened, and crumbly with subungual debris and tenderness to dorsal  palpation. Anonychia noted L hallux and R hallux. Nailbed(s) epithelialized.  Hyperkeratotic lesion(s) subheel left foot, submet head 2 right foot, submet head 4 left foot and submet head 5 right foot.  No erythema, no edema, no drainage, no flocculence.  Normal muscle  strength 5/5 to all lower extremity muscle groups bilaterally, no pain crepitus or joint limitation noted with ROM b/l and hammertoes noted to the  2-5 bilaterally  Protective sensation intact 5/5 intact bilaterally with 10g monofilament b/l  Hemoglobin A1C Latest Ref Rng & Units 02/06/2020 10/10/2019  HGBA1C 4.0 - 5.6 % 6.5(A) 10.2(A)  Some recent data might be hidden   1. Pain due to onychomycosis of toenails of both feet   2. Callus   3. Diabetes mellitus type 2 with peripheral artery disease ([place])   4. Acquired hammertoes of both feet   5. Encounter for diabetic foot exam ([place])     -Continue diabetic foot care principles. Literature dispensed on today.  -Toenails 2-5 bilaterally debrided in length and girth without iatrogenic bleeding with sterile nail nipper and dremel.  -Callus(es) sub heel right foot, submet head 2 right foot, submet head 4 left foot and submet head 5 right foot were debrided without complication or incident. Total number debrided =4. -Patient to continue soft, supportive shoe gear daily. -Patient to report any pedal injuries to medical professional immediately. -Patient/POA to call should there be question/concern in the interim.  Return in about 3 months (around 06/17/2020) for diabetic nail and callus trim.

## 2020-03-21 ENCOUNTER — Encounter: Payer: Self-pay | Admitting: Orthopedic Surgery

## 2020-03-21 DIAGNOSIS — H401113 Primary open-angle glaucoma, right eye, severe stage: Secondary | ICD-10-CM | POA: Diagnosis not present

## 2020-03-21 DIAGNOSIS — H401122 Primary open-angle glaucoma, left eye, moderate stage: Secondary | ICD-10-CM | POA: Diagnosis not present

## 2020-03-21 DIAGNOSIS — M1712 Unilateral primary osteoarthritis, left knee: Secondary | ICD-10-CM

## 2020-03-21 DIAGNOSIS — Z961 Presence of intraocular lens: Secondary | ICD-10-CM | POA: Diagnosis not present

## 2020-03-21 MED ORDER — METHYLPREDNISOLONE ACETATE 40 MG/ML IJ SUSP
40.0000 mg | INTRAMUSCULAR | Status: AC | PRN
  Administered 2020-03-21: 40 mg via INTRA_ARTICULAR

## 2020-03-21 MED ORDER — BUPIVACAINE HCL 0.25 % IJ SOLN
4.0000 mL | INTRAMUSCULAR | Status: AC | PRN
  Administered 2020-03-21: 4 mL via INTRA_ARTICULAR

## 2020-03-21 MED ORDER — LIDOCAINE HCL 1 % IJ SOLN
5.0000 mL | INTRAMUSCULAR | Status: AC | PRN
  Administered 2020-03-21: 5 mL

## 2020-03-21 NOTE — Progress Notes (Signed)
Office Visit Note   Patient: Susan Holmes           Date of Birth: 14-Jan-1956           MRN: [id number] Visit Date: 03/20/2020 Requested by: Elam Dutch, MD [address] [place],  [place] [address] PCP: [name], MD  Subjective: Chief Complaint  Patient presents with  . Left Knee - Pain    HPI: Susan Holmes is a 64 y.o. female who presents to the office complaining of left knee pain.  Patient notes increased left knee pain over the last 3 to 4 months.  Pain is worse at night and wakes her up.  She denies any groin pain, low back pain, radicular symptoms.  She notes gradual onset of left knee pain without injury.  Localizes the majority of her pain to the medial aspect of the left knee.  She does have a history of deep vein thrombosis in the left leg.  She has been taking ibuprofen 800 mg with some relief.  She has a history of vascular bypass surgery in the left leg with a incision in the medial aspect of the left thigh.  She denies being on any blood thinners..                ROS:  All systems reviewed are negative as they relate to the chief complaint within the history of present illness.  Patient denies fevers or chills.  Assessment & Plan: Visit Diagnoses:  1. Left knee pain, unspecified chronicity   2. Unilateral primary osteoarthritis, left knee     Plan: Patient is a 64 year old female who presents complaining of left knee pain.  Radiographs of the left knee reveal calcified menisci with no significant joint space narrowing of the medial or lateral compartments.  She does have some mild narrowing of the medial aspect of the patellofemoral joint.  She is having some mechanical popping/clicking but denies any frank locking of the left knee.  Left knee cortisone injection was administered today.  Patient tolerated the procedure well.  Plan for patient return to the office in 4 weeks for clinical recheck.  We will decide for or against an MRI scan at that time.  Negative  Homans today and no calf asymmetry.  Deep vein thrombosis unlikely.  Follow-Up Instructions: No follow-ups on file.   Orders:  Orders Placed This Encounter  Procedures  . XR KNEE 3 VIEW LEFT   No orders of the defined types were placed in this encounter.     Procedures: Large Joint Inj: L knee on 03/21/2020 6:19 PM Indications: diagnostic evaluation, joint swelling and pain Details: 18 G 1.5 in needle, superolateral approach  Arthrogram: No  Medications: 5 mL lidocaine 1 %; 40 mg methylPREDNISolone acetate 40 MG/ML; 4 mL bupivacaine 0.25 % Outcome: tolerated well, no immediate complications Procedure, treatment alternatives, risks and benefits explained, specific risks discussed. Consent was given by the patient. Immediately prior to procedure a time out was called to verify the correct patient, procedure, equipment, support staff and site/side marked as required. Patient was prepped and draped in the usual sterile fashion.       Clinical Data: No additional findings.  Objective: Vital Signs: There were no vitals taken for this visit.  Physical Exam:  Constitutional: Patient appears well-developed HEENT:  Head: Normocephalic Eyes:EOM are normal Neck: Normal range of motion Cardiovascular: Normal rate Pulmonary/chest: Effort normal Neurologic: Patient is alert Skin: Skin is warm Psychiatric: Patient has normal mood  and affect  Ortho Exam:  Left knee Exam Tender to palpation over the medial joint line No effusion Extensor mechanism intact No TTP over the lateral jointlines, quad tendon, patellar tendon, pes anserinus, patella, tibial tubercle, LCL/MCL insertions Stable to varus/valgus stresses.  Stable to anterior/posterior drawer Extension to 0 degrees Flexion > 90 degrees  Specialty Comments:  No specialty comments available.  Imaging: No results found.   PMFS History: Patient Active Problem List   Diagnosis Date Noted  . Neck pain on left side  12/17/2019  . PAD (peripheral artery disease) ([place]) 10/22/2019  . Sinus congestion 10/11/2019  . PVD (peripheral vascular disease) ([place]) 10/04/2019  . Insulin dependent type 2 diabetes mellitus ([place]) 10/04/2019  . Claudication ([place]) 09/14/2019  . Left foot pain 07/12/2019  . Paronychia of great toe, left 06/21/2019  . Orthostatic hypotension 02/15/2019  . Diaphoresis 02/15/2019  . Nephrolithiasis 10/03/2018  . Left chest pressure 09/26/2018  . Upper back pain on right side 09/19/2018  . Callus of foot 09/19/2018  . Reactive airway disease 06/20/2018  . Unintentional weight loss 06/14/2018  . History of vitamin D deficiency 09/06/2017  . Forgetfulness 06/07/2017  . Dyspepsia 04/26/2017  . History of CVA (cerebrovascular accident) 07/23/2016  . Cough 07/08/2016  . Lower back pain 08/06/2015  . At risk for polypharmacy 08/06/2015  . Osteoarthritis 12/11/2014  . History of chest pain 10/07/2014  . Pre-operative cardiovascular examination 10/07/2014  . Diarrhea 04/04/2014  . Liver hemangioma 04/04/2014  . RBC microcytosis 02/05/2014  . Hyperparathyroidism ([place]) 03/08/2013  . Calcific tendinitis of right shoulder 02/26/2012  . Fatigue 12/14/2011  . Uncontrolled type II diabetes mellitus ([place]) 06/12/2007  . Hyperlipidemia associated with type 2 diabetes mellitus ([place]) 06/12/2007  . ANXIETY 06/12/2007  . TOBACCO ABUSE 06/12/2007  . Depression 06/12/2007  . CONGENITAL BLINDNESS, LEFT EYE 06/12/2007  . Essential hypertension 06/12/2007   Past Medical History:  Diagnosis Date  . Anxiety   . Arthritis   . Chest pain 09/26/2018  . Congenital blindness    Left  eye  . Coronary artery disease    per cardiac cath 2015-- mild disease involving LAD and branches ,  LCFx and branches, and mild disease pRCA  . Depression   . Diabetes mellitus, type II ([place])    followed by pcp  . Glaucoma   . Glaucoma, both eyes   . Hepatitis B infection pt unsure   resolved  . History of kidney  stones    right stone  . History of transient ischemic attack (TIA) 2017   per pt no residuals  . HTN (hypertension)    followed by pcp  . Hyperlipidemia   . PAD (peripheral artery disease) ([place])   . Poor dental hygiene   . Stroke ([place])   . Tobacco abuse    .5 PPD smoker    Family History  Problem Relation Age of Onset  . Heart disease Mother 12  . Hypertension Mother   . CVA Mother   . Heart disease Father 89  . CVA Sister   . CVA Brother   . Aneurysm Brother   . Aneurysm Sister   . CVA Maternal Grandmother   . CVA Maternal Grandfather   . Colon cancer Neg Hx   . Esophageal cancer Neg Hx   . Stomach cancer Neg Hx     Past Surgical History:  Procedure Laterality Date  . ABDOMINAL AORTOGRAM W/LOWER EXTREMITY N/A 09/14/2019   Procedure: ABDOMINAL AORTOGRAM W/LOWER EXTREMITY;  Surgeon: Oneida Alar,  Jessy Oto, MD;  Location: [facility];  Service: Cardiovascular;  Laterality: N/A;  . ABDOMINAL HYSTERECTOMY  1980s   unsure if ovaries were taken  . BYPASS GRAFT Left 10/22/2019    Left femoral to above-knee popliteal bypass using reversed ipsilateral greater saphenous vein  . colonoscopy    . FEMORAL-POPLITEAL BYPASS GRAFT Left 10/22/2019   Procedure: LEFT FEMORAL-ABOVE KNEE POPLITEAL  BYPASS GRAFT with REVERSED GREATER SAPHENOUS VEIN;  Surgeon: Elam Dutch, MD;  Location: [facility];  Service: Vascular;  Laterality: Left;  . GLAUCOMA SURGERY Right 2017  . IR URETERAL STENT RIGHT NEW ACCESS W/O SEP NEPHROSTOMY CATH  10/03/2018  . LEFT HEART CATHETERIZATION WITH CORONARY ANGIOGRAM N/A 10/09/2014   Procedure: LEFT HEART CATHETERIZATION WITH CORONARY ANGIOGRAM;  Surgeon: Troy Sine, MD;  Location: [facility] CATH LAB;  Service: Cardiovascular;  Laterality: N/A;  . NEPHROLITHOTOMY Right 10/03/2018   Procedure: RIGHT NEPHROLITHOTOMY PERCUTANEOUS FIRST STAGE;  Surgeon: Irine Seal, MD;  Location: [facility];  Service: Urology;  Laterality: Right;  . PERIPHERAL VASCULAR INTERVENTION Left  09/14/2019   Procedure: PERIPHERAL VASCULAR INTERVENTION;  Surgeon: Elam Dutch, MD;  Location: [facility];  Service: Cardiovascular;  Laterality: Left;   left external iliac stent   . TEE WITHOUT CARDIOVERSION N/A 08/17/2016   Procedure: TRANSESOPHAGEAL ECHOCARDIOGRAM (TEE);  Surgeon: Sueanne Margarita, MD;  Location: [facility] ENDOSCOPY;  Service: Cardiovascular;  Laterality: N/A;   Social History   Occupational History    Employer: UNEMPLOYED  Tobacco Use  . Smoking status: Current Every Day Smoker    Packs/day: 0.50    Years: 39.00    Pack years: 19.50    Types: Cigarettes    Start date: 42  . Smokeless tobacco: Current User    Types: Chew  . Tobacco comment: half pack per day per patient report 10/25/2019  Substance and Sexual Activity  . Alcohol use: No    Alcohol/week: 0.0 standard drinks  . Drug use: No  . Sexual activity: Not Currently    Birth control/protection: Surgical

## 2020-03-25 ENCOUNTER — Encounter: Payer: Medicare Other | Admitting: Internal Medicine

## 2020-03-26 ENCOUNTER — Telehealth: Payer: Medicare Other

## 2020-03-26 ENCOUNTER — Ambulatory Visit: Payer: Self-pay | Admitting: *Deleted

## 2020-03-26 DIAGNOSIS — I10 Essential (primary) hypertension: Secondary | ICD-10-CM

## 2020-03-26 DIAGNOSIS — E1165 Type 2 diabetes mellitus with hyperglycemia: Secondary | ICD-10-CM

## 2020-03-26 NOTE — Chronic Care Management (AMB) (Signed)
  Chronic Care Management   Outreach Note  03/26/2020 Name: Susan Holmes MRN: [id number] DOB: 03-22-1956  Referred by: [name], MD Reason for referral : Chronic Care Management (IDDM, HTN, PVD)  Reached patient at home number to complete scheduled initial telephone assessment. Patient requested return call next week as she was on the phone with another caller.  Follow Up Plan: Telephone follow up appointment with care management team member scheduled for:03/31/20 at 3:00 pm.  Kelli Churn RN, CCM, [facility] RN Care Manager [phone]

## 2020-03-31 ENCOUNTER — Telehealth: Payer: Medicare Other

## 2020-04-01 ENCOUNTER — Ambulatory Visit: Payer: Self-pay | Admitting: *Deleted

## 2020-04-01 ENCOUNTER — Ambulatory Visit: Payer: Medicare Other | Admitting: *Deleted

## 2020-04-01 DIAGNOSIS — I10 Essential (primary) hypertension: Secondary | ICD-10-CM

## 2020-04-01 DIAGNOSIS — E1165 Type 2 diabetes mellitus with hyperglycemia: Secondary | ICD-10-CM

## 2020-04-01 NOTE — Patient Instructions (Signed)
Visit Information It was nice speaking with you today. I will contact you once I have consulted with your provider about your blood pressure readings.  Please avoid energy drinks, processed foods, don't salt your food, and take your medicines as prescribed.   Goals Addressed            This Visit's Progress     Patient Stated   . My blood pressure has been running high since the weekend and I have a headache" (pt-stated)       [place] (see longitudinal plan of care for additional care plan information)   Current Barriers:  Marland Kitchen Knowledge Deficits related to basic understanding of hypertension pathophysiology and self care management . Knowledge Deficits related to understanding of medications prescribed for management of hypertension- patient states her blood pressure has been running high since the weekend (approximately 4 days) . She says she has checked it with her home blood pressure arm and wrist monitor and her brother's monitor. She reports systolic pressures of >440 and diastolic pressures of >102. She says she does not consume processed foods but does salt her food. She says she drinks one [employer] drink daily (150 mg caffeine) and 1/2 cup of caffeinated coffee.   Case Manager Clinical Goal(s):  Marland Kitchen Over the next 30 days, patient will verbalize understanding of plan for hypertension management . Over the next 30 days, patient will attend all scheduled medical appointments:  . Over the next 30 days, patient will demonstrate improved adherence to prescribed treatment plan for hypertension as evidenced by taking all medications as prescribed, monitoring and recording blood pressure as directed, adhering to low sodium/DASH diet . Over the next 30 days, patient will demonstrate improved health management independence as evidenced by checking blood pressure as directed and notifying PCP if SBP>200 or DBP > 100, taking all medications as prescribe, and adhering to a low sodium  diet as discussed.  Interventions:  . Evaluation of current treatment plan related to hypertension self management and patient's adherence to plan as established by provider. . Reviewed medications with patient and discussed importance of compliance . Discussed plans with patient for ongoing care management follow up and provided patient with direct contact information for care management team . Advised patient, providing education and rationale, to monitor blood pressure daily and record, calling PCP for findings outside established parameters.  . Advised to stop drinking energy drinks and avoid adding salt to food and to avoid processed foods.  . Reviewed scheduled/upcoming provider appointments including: appointment scheduled for 04/03/20 related to reports of elevated blood pressure . Urgent In Basket message sent to Dr. Marva Panda with patient's home BP readings and c/o headache with request for feedback.  Patient Self Care Activities:  . UNABLE to independently:mange blood pressure to meet target of <140/<90 . Self administers medications as prescribed . Attends all scheduled provider appointments . Calls provider office for new concerns, questions, or BP outside discussed parameters . Checks BP and records as discussed . Follows a low sodium diet/DASH diet . Eliminate energy drinks  Initial goal documentation        Other   . Blood Pressure < 140/90       BP Readings from Last 3 Encounters:  03/06/20 (!) 175/101  02/06/20 (!) 141/64  12/18/19 137/86    Not meeting blood pressure targets    . HEMOGLOBIN A1C < 7.0       Lab Results  Component Value Date   HGBA1C 6.5 (A)  02/06/2020    Meeting Hgb A1C  target    . Quit smoking / using tobacco   Not on track      Ms. Lovan was given information about Chronic Care Management services today including:  1. CCM service includes personalized support from designated clinical staff supervised by her physician, including  individualized plan of care and coordination with other care providers 2. 24/7 contact phone numbers for assistance for urgent and routine care needs. 3. Service will only be billed when office clinical staff spend 20 minutes or more in a month to coordinate care. 4. Only one practitioner may furnish and bill the service in a calendar month. 5. The patient may stop CCM services at any time (effective at the end of the month) by phone call to the office staff. 6. The patient will be responsible for cost sharing (co-pay) of up to 20% of the service fee (after annual deductible is met).  Patient agreed to services and verbal consent obtained.   The patient verbalized understanding of instructions provided today and declined a print copy of patient instruction materials.   The care management team will reach out to the patient again over the next 7 days.   Kelli Churn RN, CCM, [facility] RN Care Manager [phone]

## 2020-04-01 NOTE — Chronic Care Management (AMB) (Signed)
Chronic Care Management   Follow Up Note   04/01/2020 Name: Susan Holmes MRN: [id number] DOB: 1956-05-18  Referred by: [name], MD Reason for referral : Chronic Care Management (HTN, DM)   Susan Holmes is a 64 y.o. year old female who is a primary care patient of Aslam, Loralyn [place], MD. The CCM team was consulted for assistance with chronic disease management and care coordination needs.    Review of patient status, including review of consultants reports, relevant laboratory and other test results, and collaboration with appropriate care team members and the patient's provider was performed as part of comprehensive patient evaluation and provision of chronic care management services.    SDOH (Social Determinants of Health) assessments performed: No See Care Plan activities for detailed interventions related to [facility])     Outpatient Encounter Medications as of 04/01/2020  Medication Sig  . Accu-Chek FastClix Lancets MISC CHECK BLOOD SUGAR THREE TIMES DAILY AS DIRECTED.  Marland Kitchen amLODipine (NORVASC) 10 MG tablet Take 0.5 tablets (5 mg total) by mouth every morning.  Marland Kitchen aspirin EC 81 MG tablet Take 81 mg by mouth daily.  . Blood Glucose Monitoring Suppl (ACCU-CHEK GUIDE) w/Device KIT 1 each by Does not apply route 3 (three) times daily.  . carvedilol (COREG) 12.5 MG tablet TAKE 1 TABLET(12.5 MG) BY MOUTH TWICE DAILY (Patient taking differently: Take 12.5 mg by mouth 2 (two) times daily. )  . chlorthalidone (HYGROTON) 25 MG tablet TAKE 1/2 TABLET BY MOUTH DAILY (Patient taking differently: Take 12.5 mg by mouth daily. )  . cyproheptadine (PERIACTIN) 4 MG tablet Take 4 mg by mouth 2 (two) times daily.  . diclofenac Sodium (VOLTAREN) 1 % GEL   . dorzolamide-timolol (COSOPT) 22.3-6.8 MG/ML ophthalmic solution Place 1 drop into both eyes every evening.   . DULoxetine (CYMBALTA) 30 MG capsule TAKE 1 CAPSULE(30 MG) BY MOUTH DAILY  . Ensure (ENSURE) Take 1 Can by mouth 2 (two) times daily between  meals.  . hydrOXYzine (ATARAX/VISTARIL) 25 MG tablet Take 25 mg by mouth 3 (three) times daily as needed for anxiety.  Marland Kitchen ibuprofen (ADVIL) 800 MG tablet   . Insulin Glargine (LANTUS SOLOSTAR) 100 UNIT/ML Solostar Pen Inject 28 Units into the skin at bedtime.  . insulin lispro (HUMALOG KWIKPEN) 100 UNIT/ML KwikPen Inject 0.05 mLs (5 Units total) into the skin 3 (three) times daily with meals.  . Insulin Pen Needle (VALUMARK PEN NEEDLES) 31G X 8 MM MISC 1 each by Does not apply route daily.  Marland Kitchen losartan (COZAAR) 100 MG tablet TAKE 1 TABLET(100 MG) BY MOUTH DAILY  . methocarbamol (ROBAXIN) 500 MG tablet Take 2 tablets (1,000 mg total) by mouth 4 (four) times daily. (Patient not taking: Reported on 04/01/2020)  . mirtazapine (REMERON) 45 MG tablet Take 45 mg by mouth at bedtime.  . Multiple Vitamin (MULTIVITAMIN WITH MINERALS) TABS tablet Take 1 tablet by mouth daily.  Glory Rosebush ULTRA test strip USE TO TEST BLOOD GLUCOSE THREE TIMES DAILY  . pantoprazole (PROTONIX) 40 MG tablet Take 40 mg by mouth daily.  . prednisoLONE acetate (PRED FORTE) 1 % ophthalmic suspension Place 1 drop into the right eye daily.  . rosuvastatin (CRESTOR) 20 MG tablet Take 1 tablet (20 mg total) by mouth at bedtime.  . traZODone (DESYREL) 100 MG tablet Take 300 mg by mouth at bedtime.   . TRULICITY 1.5 BW/6.2MB SOPN ADMINISTER 1.5 MG UNDER THE SKIN EVERY SATURDAY   No facility-administered encounter medications on file as of 04/01/2020.  Objective:   Goals Addressed            This Visit's Progress     Patient Stated   . My blood pressure has been running high since the weekend and I have a headache" (pt-stated)       [place] (see longitudinal plan of care for additional care plan information)   Current Barriers:  Marland Kitchen Knowledge Deficits related to basic understanding of hypertension pathophysiology and self care management . Knowledge Deficits related to understanding of medications prescribed for  management of hypertension- called patient and informed her of Dr.  Margy Clarks instructions ( see interventions below) . Patient voiced understanding and compliance.   Case Manager Clinical Goal(s):  Marland Kitchen Over the next 30 days, patient will verbalize understanding of plan for hypertension management . Over the next 30 days, patient will attend all scheduled medical appointments:  . Over the next 30 days, patient will demonstrate improved adherence to prescribed treatment plan for hypertension as evidenced by taking all medications as prescribed, monitoring and recording blood pressure as directed, adhering to low sodium/DASH diet . Over the next 30 days, patient will demonstrate improved health management independence as evidenced by checking blood pressure as directed and notifying PCP if SBP>200 or DBP > 100, taking all medications as prescribe, and adhering to a low sodium diet as discussed.  Interventions:  . Received Urgent In Basket reply from Dr Marva Panda. Hulen Skains patient and instructed her to discontine caffeinated beverages and follow low salt diet. If she has any further episodes of SBP >180 with headaches, nausea/vomiting, vision changes or focal weakness, then  go to the ED.   Patient Self Care Activities:  . UNABLE to independently:mange blood pressure to meet target of <140/<90 . Self administers medications as prescribed . Attends all scheduled provider appointments . Calls provider office for new concerns, questions, or BP outside discussed parameters or seeks emergency assistance for symptoms of stroke . Checks BP and records as discussed . Follows a low sodium diet/DASH diet . Eliminate energy drinks  Initial goal documentation          Plan:   The care management team will reach out to the patient again over the next 7 days.    Kelli Churn RN, CCM, [facility] RN Care Manager [phone]

## 2020-04-01 NOTE — Patient Instructions (Signed)
Visit Information Per Dr Margy Clarks directions of today, please seek emergency assistance if your systolic blood pressure remains >180 with headaches, nausea/vomiting, vision changes or focal weakness.   Goals Addressed            This Visit's Progress     Patient Stated   . My blood pressure has been running high since the weekend and I have a headache" (pt-stated)       [place] (see longitudinal plan of care for additional care plan information)   Current Barriers:  Marland Kitchen Knowledge Deficits related to basic understanding of hypertension pathophysiology and self care management . Knowledge Deficits related to understanding of medications prescribed for management of hypertension- called patient and informed her of Dr.  Margy Clarks instructions ( see interventions below) . Patient voiced understanding and compliance.   Case Manager Clinical Goal(s):  Marland Kitchen Over the next 30 days, patient will verbalize understanding of plan for hypertension management . Over the next 30 days, patient will attend all scheduled medical appointments:  . Over the next 30 days, patient will demonstrate improved adherence to prescribed treatment plan for hypertension as evidenced by taking all medications as prescribed, monitoring and recording blood pressure as directed, adhering to low sodium/DASH diet . Over the next 30 days, patient will demonstrate improved health management independence as evidenced by checking blood pressure as directed and notifying PCP if SBP>200 or DBP > 100, taking all medications as prescribe, and adhering to a low sodium diet as discussed.  Interventions:  . Received Urgent In Basket reply from Dr Marva Panda. Hulen Skains patient and instructed her to discontine caffeinated beverages and follow low salt diet. If she has any further episodes of SBP >180 with headaches, nausea/vomiting, vision changes or focal weakness, then  go to the ED.   Patient Self Care Activities:  . UNABLE to  independently:mange blood pressure to meet target of <140/<90 . Self administers medications as prescribed . Attends all scheduled provider appointments . Calls provider office for new concerns, questions, or BP outside discussed parameters or seeks emergency assistance for symptoms of stroke . Checks BP and records as discussed . Follows a low sodium diet/DASH diet . Eliminate energy drinks  Initial goal documentation         The patient verbalized understanding of instructions provided today and declined a print copy of patient instruction materials.   The care management team will reach out to the patient again over the next 7 days.   Kelli Churn RN, CCM, [facility] RN Care Manager [phone]

## 2020-04-01 NOTE — Chronic Care Management (AMB) (Signed)
Chronic Care Management   Initial Visit Note  04/01/2020 Name: Susan Holmes MRN: [id number] DOB: April 09, 1956  Referred by: Susan Heck, MD Reason for referral : Chronic Care Management (DM, HTN)   Susan Holmes is a 64 y.o. year old female who is a primary care patient of Aslam, Susan Freshwater, MD. The CCM team was consulted for assistance with chronic disease management and care coordination needs related to HTN, HLD and DMII  Review of patient status, including review of consultants reports, relevant laboratory and other test results, and collaboration with appropriate care team members and the patient's provider was performed as part of comprehensive patient evaluation and provision of chronic care management services.    SDOH (Social Determinants of Health) assessments performed: Yes See Care Plan activities for detailed interventions related to SDOH     Medications: Outpatient Encounter Medications as of 04/01/2020  Medication Sig Note  . Accu-Chek FastClix Lancets MISC CHECK BLOOD SUGAR THREE TIMES DAILY AS DIRECTED.   Marland Kitchen amLODipine (NORVASC) 10 MG tablet Take 0.5 tablets (5 mg total) by mouth every morning.   Marland Kitchen aspirin EC 81 MG tablet Take 81 mg by mouth daily.   . carvedilol (COREG) 12.5 MG tablet TAKE 1 TABLET(12.5 MG) BY MOUTH TWICE DAILY (Patient taking differently: Take 12.5 mg by mouth 2 (two) times daily. )   . chlorthalidone (HYGROTON) 25 MG tablet TAKE 1/2 TABLET BY MOUTH DAILY (Patient taking differently: Take 12.5 mg by mouth daily. )   . cyproheptadine (PERIACTIN) 4 MG tablet Take 4 mg by mouth 2 (two) times daily.   . diclofenac Sodium (VOLTAREN) 1 % GEL    . dorzolamide-timolol (COSOPT) 22.3-6.8 MG/ML ophthalmic solution Place 1 drop into both eyes every evening.    . DULoxetine (CYMBALTA) 30 MG capsule TAKE 1 CAPSULE(30 MG) BY MOUTH DAILY   . Ensure (ENSURE) Take 1 Can by mouth 2 (two) times daily between meals.   Marland Kitchen ibuprofen (ADVIL) 800 MG tablet    . Insulin Glargine  (LANTUS SOLOSTAR) 100 UNIT/ML Solostar Pen Inject 28 Units into the skin at bedtime.   . insulin lispro (HUMALOG KWIKPEN) 100 UNIT/ML KwikPen Inject 0.05 mLs (5 Units total) into the skin 3 (three) times daily with meals.   Marland Kitchen losartan (COZAAR) 100 MG tablet TAKE 1 TABLET(100 MG) BY MOUTH DAILY   . mirtazapine (REMERON) 45 MG tablet Take 45 mg by mouth at bedtime.   . Multiple Vitamin (MULTIVITAMIN WITH MINERALS) TABS tablet Take 1 tablet by mouth daily.   Glory Rosebush ULTRA test strip USE TO TEST BLOOD GLUCOSE THREE TIMES DAILY   . pantoprazole (PROTONIX) 40 MG tablet Take 40 mg by mouth daily.   . prednisoLONE acetate (PRED FORTE) 1 % ophthalmic suspension Place 1 drop into the right eye daily.   . rosuvastatin (CRESTOR) 20 MG tablet Take 1 tablet (20 mg total) by mouth at bedtime.   . traZODone (DESYREL) 100 MG tablet Take 300 mg by mouth at bedtime.    . TRULICITY 1.5 QI/3.4VQ SOPN ADMINISTER 1.5 MG UNDER THE SKIN EVERY SATURDAY   . Blood Glucose Monitoring Suppl (ACCU-CHEK GUIDE) w/Device KIT 1 each by Does not apply route 3 (three) times daily.   . hydrOXYzine (ATARAX/VISTARIL) 25 MG tablet Take 25 mg by mouth 3 (three) times daily as needed for anxiety.   . Insulin Pen Needle (VALUMARK PEN NEEDLES) 31G X 8 MM MISC 1 each by Does not apply route daily.   . methocarbamol (ROBAXIN) 500 MG tablet Take  2 tablets (1,000 mg total) by mouth 4 (four) times daily. (Patient not taking: Reported on 04/01/2020)   . [DISCONTINUED] budesonide-formoterol (SYMBICORT) 160-4.5 MCG/ACT inhaler Inhale 2 puffs into the lungs 2 (two) times daily as needed. (Patient not taking: Reported on 04/01/2020) 10/25/2019: Reports has not needed post-hospital discharge  . [DISCONTINUED] busPIRone (BUSPAR) 7.5 MG tablet Take 7.5 mg by mouth 3 (three) times daily.   . [DISCONTINUED] CHANTIX 0.5 MG tablet TAKE 1 TABLET(0.5 MG) BY MOUTH TWICE DAILY (Patient not taking: Reported on 04/01/2020)   . [DISCONTINUED] fluticasone (FLONASE) 50  MCG/ACT nasal spray Place 1 spray into both nostrils daily. (Patient not taking: Reported on 04/01/2020)   . [DISCONTINUED] fluticasone (FLONASE) 50 MCG/ACT nasal spray Place 1 spray into both nostrils daily. (Patient not taking: Reported on 04/01/2020)    No facility-administered encounter medications on file as of 04/01/2020.     Objective:  BP Readings from Last 3 Encounters:  03/06/20 (!) 175/101  02/06/20 (!) 141/64  12/18/19 137/86   Lab Results  Component Value Date   HGBA1C 6.5 (A) 02/06/2020   HGBA1C 10.2 (A) 10/10/2019   HGBA1C 10.6 (A) 02/01/2019   Lab Results  Component Value Date   MICROALBUR 31.8 (H) 11/24/2017 please update   LDLCALC 133 (H) 09/26/2018 please update   CREATININE 0.93 12/07/2019    Goals Addressed            This Visit's Progress     Patient Stated   . My blood pressure has been running high since the weekend and I have a headache" (pt-stated)       Paint (see longitudinal plan of care for additional care plan information)   Current Barriers:  Marland Kitchen Knowledge Deficits related to basic understanding of hypertension pathophysiology and self care management . Knowledge Deficits related to understanding of medications prescribed for management of hypertension- patient states her blood pressure has been running high since the weekend (approximately 4 days) . She says she has checked it with her home blood pressure arm and wrist monitor and her brother's monitor. She reports systolic pressures of >111 and diastolic pressures of >552. She took her pressure while on the phone with this RNCM and it was 160/117. She says she does not consume processed foods but does salt her food. She says she drinks one [employer] drink daily (150 mg caffeine) and 1/2 cup of caffeinated coffee.   Case Manager Clinical Goal(s):  Marland Kitchen Over the next 30 days, patient will verbalize understanding of plan for hypertension management . Over the next 30 days, patient will  attend all scheduled medical appointments:  . Over the next 30 days, patient will demonstrate improved adherence to prescribed treatment plan for hypertension as evidenced by taking all medications as prescribed, monitoring and recording blood pressure as directed, adhering to low sodium/DASH diet . Over the next 30 days, patient will demonstrate improved health management independence as evidenced by checking blood pressure as directed and notifying PCP if SBP>200 or DBP > 100, taking all medications as prescribe, and adhering to a low sodium diet as discussed.  Interventions:  . Evaluation of current treatment plan related to hypertension self management and patient's adherence to plan as established by provider. . Reviewed medications with patient and discussed importance of compliance . Discussed plans with patient for ongoing care management follow up and provided patient with direct contact information for care management team . Advised patient, providing education and rationale, to monitor blood pressure daily and  record, calling PCP for findings outside established parameters.  . Advised to stop drinking energy drinks and avoid adding salt to food and to avoid processed foods.  . Reviewed scheduled/upcoming provider appointments including: appointment scheduled for 04/03/20 related to reports of elevated blood pressure . Urgent In Basket message sent to Dr. Marva Panda with patient's home BP readings and c/o headache with request for feedback.  Patient Self Care Activities:  . UNABLE to independently:mange blood pressure to meet target of <140/<90 . Self administers medications as prescribed . Attends all scheduled provider appointments . Calls provider office for new concerns, questions, or BP outside discussed parameters . Checks BP and records as discussed . Follows a low sodium diet/DASH diet . Eliminate energy drinks  Initial goal documentation        Other   . Blood Pressure < 140/90        BP Readings from Last 3 Encounters:  03/06/20 (!) 175/101  02/06/20 (!) 141/64  12/18/19 137/86    Not meeting blood pressure targets    . HEMOGLOBIN A1C < 7.0       Lab Results  Component Value Date   HGBA1C 6.5 (A) 02/06/2020    Meeting Hgb A1C  target    . Quit smoking / using tobacco   Not on track       Ms. Nardone was given information about Chronic Care Management services today including:  1. CCM service includes personalized support from designated clinical staff supervised by her physician, including individualized plan of care and coordination with other care providers 2. 24/7 contact phone numbers for assistance for urgent and routine care needs. 3. Service will only be billed when office clinical staff spend 20 minutes or more in a month to coordinate care. 4. Only one practitioner may furnish and bill the service in a calendar month. 5. The patient may stop CCM services at any time (effective at the end of the month) by phone call to the office staff. 6. The patient will be responsible for cost sharing (co-pay) of up to 20% of the service fee (after annual deductible is met).  Patient agreed to services and verbal consent obtained.   Plan:   The care management team will reach out to the patient again over the next 7 days.   Kelli Churn RN, CCM, [facility] RN Care Manager [phone]

## 2020-04-01 NOTE — Progress Notes (Signed)
Internal Medicine Clinic Resident   I have personally reviewed this encounter including the documentation in this note and/or discussed this patient with the care management provider. Urgent blood pressure has been noted and patient is scheduled for appointment in [facility] on 04/03/20. I have reviewed the patient's CCM visit with my supervising attending.  Delice Bison, DO 04/01/2020

## 2020-04-02 NOTE — Progress Notes (Signed)
Internal Medicine Clinic Attending  CCM services provided by the care management provider and their documentation were discussed with Dr. Koleen Distance. We reviewed the pertinent findings, urgent action items addressed by the resident and non-urgent items to be addressed by the PCP.  I agree with the assessment, diagnosis, and plan of care documented in the CCM and resident's note. BP elevations noted, and follow up appointment tomorrow. Please consider either increasing chlorthalidone to 25 versus changing to a combination pill like tribenzor (will need to change to HCTZ) Lucious Groves, DO 04/02/2020

## 2020-04-03 ENCOUNTER — Encounter: Payer: Self-pay | Admitting: Internal Medicine

## 2020-04-03 ENCOUNTER — Other Ambulatory Visit: Payer: Self-pay

## 2020-04-03 ENCOUNTER — Other Ambulatory Visit: Payer: Self-pay | Admitting: Internal Medicine

## 2020-04-03 ENCOUNTER — Ambulatory Visit (INDEPENDENT_AMBULATORY_CARE_PROVIDER_SITE_OTHER): Payer: Medicare Other | Admitting: Internal Medicine

## 2020-04-03 VITALS — BP 155/98 | HR 79 | Temp 98.2°F | Ht 67.0 in | Wt 162.5 lb

## 2020-04-03 DIAGNOSIS — R519 Headache, unspecified: Secondary | ICD-10-CM | POA: Diagnosis not present

## 2020-04-03 DIAGNOSIS — I10 Essential (primary) hypertension: Secondary | ICD-10-CM | POA: Diagnosis not present

## 2020-04-03 DIAGNOSIS — R42 Dizziness and giddiness: Secondary | ICD-10-CM

## 2020-04-03 DIAGNOSIS — R5383 Other fatigue: Secondary | ICD-10-CM

## 2020-04-03 DIAGNOSIS — Z79899 Other long term (current) drug therapy: Secondary | ICD-10-CM

## 2020-04-03 MED ORDER — HYDRALAZINE HCL 10 MG PO TABS: 10.0000 mg | ORAL_TABLET | Freq: Two times a day (BID) | ORAL | 1 refills | Status: DC

## 2020-04-03 NOTE — Progress Notes (Signed)
   CC: Hypertension   HPI:  Susan Holmes is a 64 y.o. female, with a PMH noted below, presents to the [facility] for a follow up on her hypertension. To see the management of her acute and chronic conditions please see the attached A&P.   Past Medical History:  Diagnosis Date  . Anxiety   . Arthritis   . Chest pain 09/26/2018  . Congenital blindness    Left  eye  . Coronary artery disease    per cardiac cath 2015-- mild disease involving LAD and branches ,  LCFx and branches, and mild disease pRCA  . Depression   . Diabetes mellitus, type II ([place])    followed by pcp  . Glaucoma   . Glaucoma, both eyes   . Hepatitis B infection pt unsure   resolved  . History of kidney stones    right stone  . History of transient ischemic attack (TIA) 2017   per pt no residuals  . HTN (hypertension)    followed by pcp  . Hyperlipidemia   . PAD (peripheral artery disease) ([place])   . Poor dental hygiene   . Stroke ([place])   . Tobacco abuse    .5 PPD smoker   Review of Systems:   Review of Systems  Constitutional: Positive for malaise/fatigue. Negative for chills and weight loss.  HENT:       Headaches  Eyes: Negative for blurred vision and double vision.  Cardiovascular: Negative for chest pain and palpitations.  Gastrointestinal: Negative for abdominal pain, constipation, diarrhea, nausea and vomiting.  Neurological: Positive for dizziness and headaches.    Physical Exam:  Vitals:   04/03/20 0901  BP: (!) 155/98  Pulse: 79  Temp: 98.2 F (36.8 C)  TempSrc: Oral  SpO2: 99%  Weight: 162 lb 8 oz (73.7 kg)  Height: 5\' 7"  (1.702 m)   Physical Exam Constitutional:      General: She is not in acute distress.    Appearance: Normal appearance. She is not ill-appearing.  HENT:     Head: Normocephalic and atraumatic.  Eyes:     Conjunctiva/sclera: Conjunctivae normal.  Cardiovascular:     Rate and Rhythm: Normal rate and regular rhythm.     Pulses: Normal pulses.     Heart  sounds: Normal heart sounds. No murmur. No friction rub. No gallop.   Pulmonary:     Effort: Pulmonary effort is normal.     Breath sounds: Normal breath sounds.  Abdominal:     General: Abdomen is flat. Bowel sounds are normal.     Palpations: Abdomen is soft.  Musculoskeletal:     Right lower leg: No edema.     Left lower leg: No edema.  Neurological:     Mental Status: She is alert and oriented to person, place, and time.  Psychiatric:        Mood and Affect: Mood normal.        Behavior: Behavior normal.     Assessment & Plan:   See Encounters Tab for problem based charting.  Patient discussed with Dr. Daryll Drown

## 2020-04-03 NOTE — Patient Instructions (Signed)
To Ms. Behmer,  It was a pleasure meeting you today. Today we discussed your hypertension and fatigue. We will order several labs today to check for other causes of hypertension. Additionally, please stop taking your Hygroton (Chlorithalidone) and take Hydralazine instead. You will take 10 mg (1 pill) 12 hours apart. Please follow up in a months time for a recheck of your blood pressure. Have a good day.  Sincerely,  [name], MD

## 2020-04-03 NOTE — Assessment & Plan Note (Addendum)
Patient presents to the clinic with an elevated pressure of 155/98, with self reports of systolic pressures in the 200s at home over the past 2 weeks. She has not made any changes in her daily activities during the past month. She is compliant with her medications of Norvasc 5mg  QD, Coreg 12.5 mg BID, Hygroton 12.5 mg QD, and Cozaar 100 mg. She recently complains of fatigue, dizziness, and headaches over the past two weeks. Symptoms could be related to polypharmacy, but also due to her current regimen of blood pressure medications. Patient is still having elevated pressures despite being on 4 anti hypertensive medications. Secondary causes of hypertension workup will begin as patient appears to have resistant hypertension. Additionally, patient presents with orthostatic hypotension, which could be related to polypharmacy as she is on a beta blocker, thiazide, amlodipine, and muscle relaxants. Will D/C hygroton and begin hydralazine 10 mg BID.  Orthostatic VS for the past 24 hrs (Last 3 readings):  BP- Lying Pulse- Lying BP- Sitting Pulse- Sitting BP- Standing at 0 minutes Pulse- Standing at 0 minutes BP- Standing at 3 minutes Pulse- Standing at 3 minutes  04/03/20 0913 (!) 162/94 72 (!) 165/96 75 (!) 137/94 86 (!) 148/104 84   Plan:  - D/C Hygroton - Start hydralazine 10 mg BID - Renin:Aldo ratio - Renal Artery Duplex - BMP assess creatinine  - CBC

## 2020-04-04 LAB — BMP8+ANION GAP
Anion Gap: 17 mmol/L (ref 10.0–18.0)
BUN/Creatinine Ratio: 8 — ABNORMAL LOW (ref 12–28)
BUN: 8 mg/dL (ref 8–27)
CO2: 22 mmol/L (ref 20–29)
Calcium: 10.8 mg/dL — ABNORMAL HIGH (ref 8.7–10.3)
Chloride: 103 mmol/L (ref 96–106)
Creatinine, Ser: 0.98 mg/dL (ref 0.57–1.00)
GFR calc Af Amer: 70 mL/min/{1.73_m2} (ref >59)
GFR calc non Af Amer: 61 mL/min/{1.73_m2} (ref >59)
Glucose: 198 mg/dL — ABNORMAL HIGH (ref 65–99)
Potassium: 4.4 mmol/L (ref 3.5–5.2)
Sodium: 142 mmol/L (ref 134–144)

## 2020-04-04 LAB — CBC
Hematocrit: 41.3 % (ref 34.0–46.6)
Hemoglobin: 12.5 g/dL (ref 11.1–15.9)
MCH: 22.8 pg — ABNORMAL LOW (ref 26.6–33.0)
MCHC: 30.3 g/dL — ABNORMAL LOW (ref 31.5–35.7)
MCV: 75 fL — ABNORMAL LOW (ref 79–97)
Platelets: 239 10*3/uL (ref 150–450)
RBC: 5.48 x10E6/uL — ABNORMAL HIGH (ref 3.77–5.28)
RDW: 16.6 % — ABNORMAL HIGH (ref 11.7–15.4)
WBC: 5.5 10*3/uL (ref 3.4–10.8)

## 2020-04-06 ENCOUNTER — Other Ambulatory Visit: Payer: Self-pay | Admitting: Cardiovascular Disease

## 2020-04-09 ENCOUNTER — Telehealth: Payer: Medicare Other

## 2020-04-10 ENCOUNTER — Ambulatory Visit: Payer: Medicare Other | Admitting: *Deleted

## 2020-04-10 DIAGNOSIS — I10 Essential (primary) hypertension: Secondary | ICD-10-CM

## 2020-04-10 DIAGNOSIS — E1165 Type 2 diabetes mellitus with hyperglycemia: Secondary | ICD-10-CM

## 2020-04-10 LAB — ALDOSTERONE + RENIN ACTIVITY W/ RATIO
ALDOS/RENIN RATIO: 17.1 (ref 0.0–30.0)
ALDOSTERONE: 5.1 ng/dL (ref 0.0–30.0)
Renin: 0.298 ng/mL/hr (ref 0.167–5.380)

## 2020-04-10 NOTE — Chronic Care Management (AMB) (Signed)
Chronic Care Management   Follow Up Note   04/10/2020 Name: Susan Holmes MRN: [id number] DOB: [birth year]  Referred by: [name], MD Reason for referral : Chronic Care Management (HTN, DM)   Susan Holmes is a 64 y.o. year old female who is a primary care patient of Aslam, Loralyn Freshwater, MD. The CCM team was consulted for assistance with chronic disease management and care coordination needs.    Review of patient status, including review of consultants reports, relevant laboratory and other test results, and collaboration with appropriate care team members and the patient's provider was performed as part of comprehensive patient evaluation and provision of chronic care management services.    SDOH (Social Determinants of Health) assessments performed: No See Care Plan activities for detailed interventions related to [facility])     Outpatient Encounter Medications as of 04/10/2020  Medication Sig  . hydrALAZINE (APRESOLINE) 10 MG tablet Take 1 tablet (10 mg total) by mouth in the morning and at bedtime.  . Accu-Chek FastClix Lancets MISC CHECK BLOOD SUGAR THREE TIMES DAILY AS DIRECTED.  Marland Kitchen amLODipine (NORVASC) 10 MG tablet Take 0.5 tablets (5 mg total) by mouth every morning.  Marland Kitchen aspirin EC 81 MG tablet Take 81 mg by mouth daily.  . Blood Glucose Monitoring Suppl (ACCU-CHEK GUIDE) w/Device KIT 1 each by Does not apply route 3 (three) times daily.  . carvedilol (COREG) 12.5 MG tablet TAKE 1 TABLET(12.5 MG) BY MOUTH TWICE DAILY (Patient taking differently: Take 12.5 mg by mouth 2 (two) times daily. )  . cyproheptadine (PERIACTIN) 4 MG tablet Take 4 mg by mouth 2 (two) times daily.  . diclofenac Sodium (VOLTAREN) 1 % GEL   . dorzolamide-timolol (COSOPT) 22.3-6.8 MG/ML ophthalmic solution Place 1 drop into both eyes every evening.   . DULoxetine (CYMBALTA) 30 MG capsule TAKE 1 CAPSULE(30 MG) BY MOUTH DAILY  . Ensure (ENSURE) Take 1 Can by mouth 2 (two) times daily between meals.  . hydrOXYzine  (ATARAX/VISTARIL) 25 MG tablet Take 25 mg by mouth 3 (three) times daily as needed for anxiety.  Marland Kitchen ibuprofen (ADVIL) 800 MG tablet   . Insulin Glargine (LANTUS SOLOSTAR) 100 UNIT/ML Solostar Pen Inject 28 Units into the skin at bedtime.  . insulin lispro (HUMALOG KWIKPEN) 100 UNIT/ML KwikPen Inject 0.05 mLs (5 Units total) into the skin 3 (three) times daily with meals.  . Insulin Pen Needle (VALUMARK PEN NEEDLES) 31G X 8 MM MISC 1 each by Does not apply route daily.  Marland Kitchen losartan (COZAAR) 100 MG tablet TAKE 1 TABLET(100 MG) BY MOUTH DAILY  . methocarbamol (ROBAXIN) 500 MG tablet Take 2 tablets (1,000 mg total) by mouth 4 (four) times daily. (Patient not taking: Reported on 04/01/2020)  . mirtazapine (REMERON) 45 MG tablet Take 45 mg by mouth at bedtime.  . Multiple Vitamin (MULTIVITAMIN WITH MINERALS) TABS tablet Take 1 tablet by mouth daily.  Glory Rosebush ULTRA test strip USE TO TEST BLOOD GLUCOSE THREE TIMES DAILY  . pantoprazole (PROTONIX) 40 MG tablet Take 40 mg by mouth daily.  . prednisoLONE acetate (PRED FORTE) 1 % ophthalmic suspension Place 1 drop into the right eye daily.  . rosuvastatin (CRESTOR) 20 MG tablet Take 1 tablet (20 mg total) by mouth at bedtime.  . traZODone (DESYREL) 100 MG tablet Take 300 mg by mouth at bedtime.   . TRULICITY 1.5 QJ/3.3LK SOPN ADMINISTER 1.5 MG UNDER THE SKIN EVERY SATURDAY   No facility-administered encounter medications on file as of 04/10/2020.  Objective:  BP Readings from Last 3 Encounters:  04/03/20 (!) 155/98  03/06/20 (!) 175/101  02/06/20 (!) 141/64   Lab Results  Component Value Date   HGBA1C 6.5 (A) 02/06/2020    Goals Addressed            This Visit's Progress     Patient Stated   . "My blood pressure is better." (pt-stated)       CARE PLAN ENTRY (see longitudinal plan of care for additional care plan information)   Current Barriers:  Marland Kitchen Knowledge Deficits related to basic understanding of hypertension pathophysiology and  self care management . Knowledge Deficits related to understanding of medications prescribed for management of hypertension- patient reports the following daily blood pressure readings in sitting position from home monitor: 154/95, 160/102, 167/94, 162/100, 168/92, verified that she stopped Hygroton and started Hydralazine 10 mg twice daily per MD orders from clinic visit on  04/03/20, she says she did not bring her home monitor to the clinic visit so that the reading could be compared with the reading in the office. She says she has arranged Medicaid transportation for her renal ultrasound in the morning at 9:00 am  Case Manager Clinical Goal(s):  Marland Kitchen Over the next 30 days, patient will verbalize understanding of plan for hypertension management . Over the next 30 days, patient will attend all scheduled medical appointments:  . Over the next 30 days, patient will demonstrate improved adherence to prescribed treatment plan for hypertension as evidenced by taking all medications as prescribed, monitoring and recording blood pressure as directed, adhering to low sodium/DASH diet . Over the next 30 days, patient will demonstrate improved health management independence as evidenced by checking blood pressure as directed and notifying PCP if SBP>200 or DBP > 100, taking all medications as prescribe, and adhering to a low sodium diet as discussed.  Interventions:  . Assessed home BP readings, ensured she is taking her blood pressure medicines correctly . Reviewed upcoming appointment for renal ultrasound on 4/16 and ensured she has transportation . Reviewed other upcoming appointments  with orthopedist and podiatrist on 4/28 and 6/22 respectively  Patient Self Care Activities:  . UNABLE to independently:mange blood pressure to meet target of <140/<90 . Self administers medications as prescribed . Attends all scheduled provider appointments . Calls provider office for new concerns, questions, or BP outside  discussed parameters or seeks emergency assistance for symptoms of stroke . Checks BP and record as discussed . Follows a low sodium diet/DASH diet . Eliminate energy drinks  Please see past updates related to this goal by clicking on the "Past Updates" button in the selected goal           Plan:   The care management team will reach out to the patient again over the next 30 days.    Kelli Churn RN, CCM, [facility] RN Care Manager [phone]

## 2020-04-10 NOTE — Progress Notes (Signed)
Internal Medicine Clinic Attending  Case discussed with Dr. Winters at the time of the visit.  We reviewed the resident's history and exam and pertinent patient test results.  I agree with the assessment, diagnosis, and plan of care documented in the resident's note.  

## 2020-04-10 NOTE — Patient Instructions (Signed)
Visit Information It was nice speaking with you today. Keep monitoring you blood pressure daily as you are doing.  Continue to avoid energy drinks.   Goals Addressed            This Visit's Progress     Patient Stated   . "My blood pressure is better." (pt-stated)       CARE PLAN ENTRY (see longitudinal plan of care for additional care plan information)   Current Barriers:  Marland Kitchen Knowledge Deficits related to basic understanding of hypertension pathophysiology and self care management . Knowledge Deficits related to understanding of medications prescribed for management of hypertension- patient reports the following daily blood pressure readings in sitting position from home monitor: 154/95, 160/102, 167/94, 162/100, 168/92, verified that she stopped [place] and started Hydralazine 10 mg twice daily per MD orders from clinic visit on  04/03/20, she says she did not bring her home monitor to the clinic visit so that the reading could be compared with the reading in the office. She says she has arranged Medicaid transportation for her renal ultrasound in the morning at 9:00 am  Case Manager Clinical Goal(s):  Marland Kitchen Over the next 30 days, patient will verbalize understanding of plan for hypertension management . Over the next 30 days, patient will attend all scheduled medical appointments:  . Over the next 30 days, patient will demonstrate improved adherence to prescribed treatment plan for hypertension as evidenced by taking all medications as prescribed, monitoring and recording blood pressure as directed, adhering to low sodium/DASH diet . Over the next 30 days, patient will demonstrate improved health management independence as evidenced by checking blood pressure as directed and notifying PCP if SBP>200 or DBP > 100, taking all medications as prescribe, and adhering to a low sodium diet as discussed.  Interventions:  . Assessed home BP readings, ensured she is taking her blood pressure  medicines correctly . Reviewed upcoming appointment for renal ultrasound on 4/16 and ensured she has transportation . Reviewed other upcoming appointments  with orthopedist and podiatrist on 4/28 and 6/22 respectively  Patient Self Care Activities:  . UNABLE to independently:mange blood pressure to meet target of <140/<90 . Self administers medications as prescribed . Attends all scheduled provider appointments . Calls provider office for new concerns, questions, or BP outside discussed parameters or seeks emergency assistance for symptoms of stroke . Checks BP and record as discussed . Follows a low sodium diet/DASH diet . Eliminate energy drinks  Please see past updates related to this goal by clicking on the "Past Updates" button in the selected goal          The patient verbalized understanding of instructions provided today and declined a print copy of patient instruction materials.   The care management team will reach out to the patient again over the next 30 days.   Kelli Churn RN, CCM, [facility] RN Care Manager [phone]

## 2020-04-11 ENCOUNTER — Other Ambulatory Visit: Payer: Self-pay

## 2020-04-11 ENCOUNTER — Ambulatory Visit (HOSPITAL_COMMUNITY)
Admission: RE | Admit: 2020-04-11 | Discharge: 2020-04-11 | Disposition: A | Discharge status: Home / Self Care | Payer: Medicare Other | Source: Ambulatory Visit | Attending: Internal Medicine | Admitting: Internal Medicine

## 2020-04-11 DIAGNOSIS — I10 Essential (primary) hypertension: Secondary | ICD-10-CM | POA: Diagnosis present

## 2020-04-11 NOTE — Progress Notes (Signed)
Renal artery duplex       has been completed. Preliminary results can be found under CV proc through chart review. Ilka Lovick, BS, RDMS, RVT   

## 2020-04-11 NOTE — Progress Notes (Signed)
Internal Medicine Clinic Resident  I have personally reviewed this encounter including the documentation in this note and/or discussed this patient with the care management provider. I will address any urgent items identified by the care management provider and will communicate my actions to the patient's PCP. I have reviewed the patient's CCM visit with my supervising attending.  Ladona Horns, MD 04/11/2020

## 2020-04-15 NOTE — Progress Notes (Signed)
Internal Medicine Clinic Attending  CCM services provided by the care management provider and their documentation were discussed with Dr. Ronnald Ramp. We reviewed the pertinent findings, urgent action items addressed by the resident and non-urgent items to be addressed by the PCP.  I agree with the assessment, diagnosis, and plan of care documented in the CCM and resident's note.  Gilles Chiquito, MD 04/15/2020

## 2020-04-21 ENCOUNTER — Telehealth: Payer: Self-pay | Admitting: Internal Medicine

## 2020-04-21 NOTE — Telephone Encounter (Signed)
Pt needs an referral to [facility]; pls contact [phone]

## 2020-04-22 ENCOUNTER — Telehealth: Payer: Self-pay | Admitting: *Deleted

## 2020-04-22 DIAGNOSIS — M19019 Primary osteoarthritis, unspecified shoulder: Secondary | ICD-10-CM

## 2020-04-22 NOTE — Telephone Encounter (Signed)
Patient called regarding referral to [facility] for back and shoulder pain. Recent imaging of left shoulder from 11/2019 with mild arthritic changes. No images of spine. However, patient requesting referral to [facility] at this time. Will place referral.

## 2020-04-22 NOTE — Telephone Encounter (Signed)
Called patient and [facility] for her to call back in reference to a referral to [facility]

## 2020-04-23 ENCOUNTER — Other Ambulatory Visit: Payer: Self-pay

## 2020-04-23 ENCOUNTER — Ambulatory Visit (INDEPENDENT_AMBULATORY_CARE_PROVIDER_SITE_OTHER): Payer: Medicare Other | Admitting: Orthopedic Surgery

## 2020-04-23 ENCOUNTER — Other Ambulatory Visit: Payer: Self-pay | Admitting: Orthopedic Surgery

## 2020-04-23 ENCOUNTER — Encounter: Payer: Self-pay | Admitting: Orthopedic Surgery

## 2020-04-23 DIAGNOSIS — M25562 Pain in left knee: Secondary | ICD-10-CM

## 2020-04-23 MED ORDER — MELOXICAM 15 MG PO TABS: 15.0000 mg | ORAL_TABLET | Freq: Every day | ORAL | 0 refills | Status: DC

## 2020-04-23 NOTE — Progress Notes (Signed)
Office Visit Note   Patient: Susan Holmes           Date of Birth: 02/20/1956           MRN: [id number] Visit Date: 04/23/2020 Requested by: [name], MD [address] [place],  [place] [address] PCP: [name], MD  Subjective: Chief Complaint  Patient presents with  . Left Knee - Follow-up, Pain    HPI: Susan Holmes is a patient with left knee pain.  She had left knee injection 03/20/2020 which did not give her much relief.  Decision point today was for or against MRI scanning.  Denies much in the way of frank instability.  Does report medial sided pain.  Has a history of vascular bypass procedure on that left leg.  Takes Advil without much relief.  Tramadol also without much relief and Tylenol 3 without much relief.  She states that all her symptoms began with her vascular surgery in October 2020.              ROS: All systems reviewed are negative as they relate to the chief complaint within the history of present illness.  Patient denies  fevers or chills.   Assessment & Plan: Visit Diagnoses:  1. Left knee pain, unspecified chronicity     Plan: Impression is left knee pain unclear etiology with not much help with injection.  Exam and radiographs are not really helpful with the diagnosis.  No real mechanical symptoms or effusion.  She may have some medial compartment pathology or she could have referred pain from this incision for vascular bypass.  She has great pedal pulses today.  Follow-up after MRI scanning.  She is failed conservative measures and her symptoms have been ongoing now for a year and a half.  Follow-Up Instructions: No follow-ups on file.   Orders:  Orders Placed This Encounter  Procedures  . MR Knee Left w/o contrast   Meds ordered this encounter  Medications  . meloxicam (MOBIC) 15 MG tablet    Sig: Take 1 tablet (15 mg total) by mouth daily.    Dispense:  30 tablet    Refill:  0      Procedures: No procedures performed   Clinical  Data: No additional findings.  Objective: Vital Signs: There were no vitals taken for this visit.  Physical Exam:   Constitutional: Patient appears well-developed HEENT:  Head: Normocephalic Eyes:EOM are normal Neck: Normal range of motion Cardiovascular: Normal rate Pulmonary/chest: Effort normal Neurologic: Patient is alert Skin: Skin is warm Psychiatric: Patient has normal mood and affect    Ortho Exam: Ortho exam demonstrates pretty normal gait alignment.  No groin pain with internal X rotation leg.  Palpable pedal pulses in that left knee.  Mild medial joint line tenderness on the left compared to the right.  Full range of motion.  No coarse grinding or crepitus in the patellofemoral joint with range of motion.  No other masses lymphadenopathy or skin changes noted in that left knee region.  Specialty Comments:  No specialty comments available.  Imaging: No results found.   PMFS History: Patient Active Problem List   Diagnosis Date Noted  . Neck pain on left side 12/17/2019  . PAD (peripheral artery disease) ([employer]) 10/22/2019  . Sinus congestion 10/11/2019  . PVD (peripheral vascular disease) ([place]) 10/04/2019  . Insulin dependent type 2 diabetes mellitus ([place]) 10/04/2019  . Claudication ([place]) 09/14/2019  . Left foot pain 07/12/2019  . Paronychia of  great toe, left 06/21/2019  . Orthostatic hypotension 02/15/2019  . Diaphoresis 02/15/2019  . Nephrolithiasis 10/03/2018  . Left chest pressure 09/26/2018  . Upper back pain on right side 09/19/2018  . Callus of foot 09/19/2018  . Reactive airway disease 06/20/2018  . Unintentional weight loss 06/14/2018  . History of vitamin D deficiency 09/06/2017  . Forgetfulness 06/07/2017  . Dyspepsia 04/26/2017  . History of CVA (cerebrovascular accident) 07/23/2016  . Cough 07/08/2016  . Lower back pain 08/06/2015  . At risk for polypharmacy 08/06/2015  . Osteoarthritis 12/11/2014  . History of chest pain 10/07/2014  .  Pre-operative cardiovascular examination 10/07/2014  . Diarrhea 04/04/2014  . Liver hemangioma 04/04/2014  . RBC microcytosis 02/05/2014  . Hyperparathyroidism ([place]) 03/08/2013  . Calcific tendinitis of right shoulder 02/26/2012  . Fatigue 12/14/2011  . Uncontrolled type II diabetes mellitus ([place]) 06/12/2007  . Hyperlipidemia associated with type 2 diabetes mellitus ([place]) 06/12/2007  . ANXIETY 06/12/2007  . TOBACCO ABUSE 06/12/2007  . Depression 06/12/2007  . CONGENITAL BLINDNESS, LEFT EYE 06/12/2007  . Essential hypertension 06/12/2007   Past Medical History:  Diagnosis Date  . Anxiety   . Arthritis   . Chest pain 09/26/2018  . Congenital blindness    Left  eye  . Coronary artery disease    per cardiac cath 2015-- mild disease involving LAD and branches ,  LCFx and branches, and mild disease pRCA  . Depression   . Diabetes mellitus, type II ([place])    followed by pcp  . Glaucoma   . Glaucoma, both eyes   . Hepatitis B infection pt unsure   resolved  . History of kidney stones    right stone  . History of transient ischemic attack (TIA) 2017   per pt no residuals  . HTN (hypertension)    followed by pcp  . Hyperlipidemia   . PAD (peripheral artery disease) ([place])   . Poor dental hygiene   . Stroke ([place])   . Tobacco abuse    .5 PPD smoker    Family History  Problem Relation Age of Onset  . Heart disease Mother 89  . Hypertension Mother   . CVA Mother   . Heart disease Father 74  . CVA Sister   . CVA Brother   . Aneurysm Brother   . Aneurysm Sister   . CVA Maternal Grandmother   . CVA Maternal Grandfather   . Colon cancer Neg Hx   . Esophageal cancer Neg Hx   . Stomach cancer Neg Hx     Past Surgical History:  Procedure Laterality Date  . ABDOMINAL AORTOGRAM W/LOWER EXTREMITY N/A 09/14/2019   Procedure: ABDOMINAL AORTOGRAM W/LOWER EXTREMITY;  Surgeon: Elam Dutch, MD;  Location: [facility];  Service: Cardiovascular;  Laterality: N/A;  .  ABDOMINAL HYSTERECTOMY  1980s   unsure if ovaries were taken  . BYPASS GRAFT Left 10/22/2019    Left femoral to above-knee popliteal bypass using reversed ipsilateral greater saphenous vein  . colonoscopy    . FEMORAL-POPLITEAL BYPASS GRAFT Left 10/22/2019   Procedure: LEFT FEMORAL-ABOVE KNEE POPLITEAL  BYPASS GRAFT with REVERSED GREATER SAPHENOUS VEIN;  Surgeon: Elam Dutch, MD;  Location: [facility];  Service: Vascular;  Laterality: Left;  . GLAUCOMA SURGERY Right 2017  . IR URETERAL STENT RIGHT NEW ACCESS W/O SEP NEPHROSTOMY CATH  10/03/2018  . LEFT HEART CATHETERIZATION WITH CORONARY ANGIOGRAM N/A 10/09/2014   Procedure: LEFT HEART CATHETERIZATION WITH CORONARY ANGIOGRAM;  Surgeon: Troy Sine,  MD;  Location: [facility];  Service: Cardiovascular;  Laterality: N/A;  . NEPHROLITHOTOMY Right 10/03/2018   Procedure: RIGHT NEPHROLITHOTOMY PERCUTANEOUS FIRST STAGE;  Surgeon: Irine Seal, MD;  Location: [facility];  Service: Urology;  Laterality: Right;  . PERIPHERAL VASCULAR INTERVENTION Left 09/14/2019   Procedure: PERIPHERAL VASCULAR INTERVENTION;  Surgeon: Elam Dutch, MD;  Location: [facility];  Service: Cardiovascular;  Laterality: Left;   left external iliac stent   . TEE WITHOUT CARDIOVERSION N/A 08/17/2016   Procedure: TRANSESOPHAGEAL ECHOCARDIOGRAM (TEE);  Surgeon: Sueanne Margarita, MD;  Location: [facility];  Service: Cardiovascular;  Laterality: N/A;   Social History   Occupational History    Employer: UNEMPLOYED  Tobacco Use  . Smoking status: Current Every Day Smoker    Packs/day: 0.50    Years: 39.00    Pack years: 19.50    Types: Cigarettes    Start date: 70  . Smokeless tobacco: Current User    Types: Chew  . Tobacco comment: half pack per day per patient report 10/25/2019  Substance and Sexual Activity  . Alcohol use: No    Alcohol/week: 0.0 standard drinks  . Drug use: No  . Sexual activity: Not Currently    Birth control/protection: Surgical

## 2020-04-23 NOTE — Telephone Encounter (Signed)
A Message has been left again for the patient to call back in reference to a referral to [facility].

## 2020-04-24 NOTE — Telephone Encounter (Signed)
Ok to rf? 

## 2020-04-29 ENCOUNTER — Telehealth: Payer: Medicare Other

## 2020-04-30 ENCOUNTER — Encounter: Payer: Self-pay | Admitting: General Practice

## 2020-05-02 ENCOUNTER — Telehealth: Payer: Medicare Other

## 2020-05-07 ENCOUNTER — Telehealth: Payer: Medicare Other

## 2020-05-09 ENCOUNTER — Ambulatory Visit: Payer: Medicare Other | Admitting: *Deleted

## 2020-05-09 DIAGNOSIS — I10 Essential (primary) hypertension: Secondary | ICD-10-CM

## 2020-05-09 DIAGNOSIS — E1165 Type 2 diabetes mellitus with hyperglycemia: Secondary | ICD-10-CM

## 2020-05-09 NOTE — Patient Instructions (Signed)
Visit Information It was nice speaking with you. I'm glad your blood sugar and blood pressure are doing well but Im sorry to hear you are having ongoing trouble with your knee.   Goals Addressed            This Visit's Progress     Patient Stated   . "My blood pressure is better but I'm having knee problems." (pt-stated)       CARE PLAN ENTRY (see longitudinal plan of care for additional care plan information)   Current Barriers:  Marland Kitchen Knowledge Deficits related to basic understanding of hypertension pathophysiology and self care management . Knowledge Deficits related to understanding of medications prescribed for management of hypertension- patient states she does not have much time to talk as she was just "heading out the door'- she says she is doing fine, self monitoring blood pressure and blood sugar and she the readings have been good , she says she will have MRI of her left knee on 6/2 s the steroid injection she received on 03/20/20 did not provide effective relief.   Case Manager Clinical Goal(s):  Marland Kitchen Over the next 30 days, patient will verbalize understanding of plan for hypertension management . Over the next 30 days, patient will attend all scheduled medical appointments:  . Over the next 30 days, patient will demonstrate improved adherence to prescribed treatment plan for hypertension as evidenced by taking all medications as prescribed, monitoring and recording blood pressure as directed, adhering to low sodium/DASH diet . Over the next 30 days, patient will demonstrate improved health management independence as evidenced by checking blood pressure as directed and notifying PCP if SBP>200 or DBP > 100, taking all medications as prescribe, and adhering to a low sodium diet as discussed.  Interventions:  . Assessed home BP readings, ensured she is taking her blood pressure medicines correctly . Assessed home blood sugar readings  . Reviewed upcoming appointments for left knee MRI  on 6/2 and appointment with Dr Marlou Sa (ortho) on 6/4 and podiatrist on 6/22 . Reviewed results of renal ultrasound done 4/16- no evidence of renal artery stenosis  Patient Self Care Activities:  . Able to independently manage blood pressure to meet target of <140/<90 and to manage blood sugar to meet Hgb A1C target . Self administers medications as prescribed . Attends all scheduled provider appointments . Calls provider office for new concerns, questions, or BP outside discussed parameters or seeks emergency assistance for symptoms of stroke . Checks BP and record as discussed . Checks blood sugar at least daily and calls provider for readings outside established parameters . Follows a low sodium diet/DASH diet . Eliminate energy drinks  Please see past updates related to this goal by clicking on the "Past Updates" button in the selected goal          The patient verbalized understanding of instructions provided today and declined a print copy of patient instruction materials.   The care management team will reach out to the patient again over the next 30 days.   Kelli Churn RN, CCM, [facility] RN Care Manager [phone]

## 2020-05-09 NOTE — Chronic Care Management (AMB) (Signed)
Chronic Care Management   Follow Up Note   05/09/2020 Name: Susan Holmes MRN: [id number] DOB: 1956/03/07  Referred by: Susan Heck, MD Reason for referral : Chronic Care Management (DM, HTN)   Susan Holmes is a 64 y.o. year old female who is a primary care patient of Aslam, Susan Freshwater, MD. The CCM team was consulted for assistance with chronic disease management and care coordination needs.    Review of patient status, including review of consultants reports, relevant laboratory and other test results, and collaboration with appropriate care team members and the patient's provider was performed as part of comprehensive patient evaluation and provision of chronic care management services.    SDOH (Social Determinants of Health) assessments performed: No See Care Plan activities for detailed interventions related to [facility])     Outpatient Encounter Medications as of 05/09/2020  Medication Sig  . Accu-Chek FastClix Lancets MISC CHECK BLOOD SUGAR THREE TIMES DAILY AS DIRECTED.  Susan Holmes amLODipine (NORVASC) 10 MG tablet Take 0.5 tablets (5 mg total) by mouth every morning.  Susan Holmes aspirin EC 81 MG tablet Take 81 mg by mouth daily.  . Blood Glucose Monitoring Suppl (ACCU-CHEK GUIDE) w/Device KIT 1 each by Does not apply route 3 (three) times daily.  . carvedilol (COREG) 12.5 MG tablet TAKE 1 TABLET(12.5 MG) BY MOUTH TWICE DAILY (Patient taking differently: Take 12.5 mg by mouth 2 (two) times daily. )  . cyproheptadine (PERIACTIN) 4 MG tablet Take 4 mg by mouth 2 (two) times daily.  . diclofenac Sodium (VOLTAREN) 1 % GEL   . dorzolamide-timolol (COSOPT) 22.3-6.8 MG/ML ophthalmic solution Place 1 drop into both eyes every evening.   . DULoxetine (CYMBALTA) 30 MG capsule TAKE 1 CAPSULE(30 MG) BY MOUTH DAILY  . Ensure (ENSURE) Take 1 Can by mouth 2 (two) times daily between meals.  . hydrALAZINE (APRESOLINE) 10 MG tablet Take 1 tablet (10 mg total) by mouth in the morning and at bedtime.  . hydrOXYzine  (ATARAX/VISTARIL) 25 MG tablet Take 25 mg by mouth 3 (three) times daily as needed for anxiety.  Susan Holmes ibuprofen (ADVIL) 800 MG tablet   . Insulin Glargine (LANTUS SOLOSTAR) 100 UNIT/ML Solostar Pen Inject 28 Units into the skin at bedtime.  . insulin lispro (HUMALOG KWIKPEN) 100 UNIT/ML KwikPen Inject 0.05 mLs (5 Units total) into the skin 3 (three) times daily with meals.  . Insulin Pen Needle (VALUMARK PEN NEEDLES) 31G X 8 MM MISC 1 each by Does not apply route daily.  Susan Holmes losartan (COZAAR) 100 MG tablet TAKE 1 TABLET(100 MG) BY MOUTH DAILY  . meloxicam (MOBIC) 15 MG tablet TAKE 1 TABLET(15 MG) BY MOUTH DAILY  . methocarbamol (ROBAXIN) 500 MG tablet Take 2 tablets (1,000 mg total) by mouth 4 (four) times daily. (Patient not taking: Reported on 04/01/2020)  . mirtazapine (REMERON) 45 MG tablet Take 45 mg by mouth at bedtime.  . Multiple Vitamin (MULTIVITAMIN WITH MINERALS) TABS tablet Take 1 tablet by mouth daily.  Glory Rosebush ULTRA test strip USE TO TEST BLOOD GLUCOSE THREE TIMES DAILY  . pantoprazole (PROTONIX) 40 MG tablet Take 40 mg by mouth daily.  . prednisoLONE acetate (PRED FORTE) 1 % ophthalmic suspension Place 1 drop into the right eye daily.  . rosuvastatin (CRESTOR) 20 MG tablet Take 1 tablet (20 mg total) by mouth at bedtime.  . traZODone (DESYREL) 100 MG tablet Take 300 mg by mouth at bedtime.   . TRULICITY 1.5 YQ/6.5HQ SOPN ADMINISTER 1.5 MG UNDER THE SKIN EVERY SATURDAY  No facility-administered encounter medications on file as of 05/09/2020.     Objective:  BP Readings from Last 3 Encounters:  04/03/20 (!) 155/98  03/06/20 (!) 175/101  02/06/20 (!) 141/64   Wt Readings from Last 3 Encounters:  04/03/20 162 lb 8 oz (73.7 kg)  03/06/20 158 lb (71.7 kg)  02/06/20 160 lb 3.2 oz (72.7 kg)   Lab Results  Component Value Date   HGBA1C 6.5 (A) 02/06/2020   HGBA1C 10.2 (A) 10/10/2019   HGBA1C 10.6 (A) 02/01/2019   Lab Results  Component Value Date   MICROALBUR 31.8 (H)  11/24/2017   LDLCALC 133 (H) 09/26/2018   CREATININE 0.98 04/03/2020   Lab Results  Component Value Date   LABMICR Comment 02/01/2019   LABMICR See below: 01/11/2018   MICROALBUR 31.8 (H) 11/24/2017   MICROALBUR 1.86 08/15/2014      Goals Addressed            This Visit's Progress     Patient Stated   . "My blood pressure is better but I'm having knee problems." (pt-stated)       CARE PLAN ENTRY (see longitudinal plan of care for additional care plan information)   Current Barriers:  Susan Holmes Knowledge Deficits related to basic understanding of hypertension pathophysiology and self care management . Knowledge Deficits related to understanding of medications prescribed for management of hypertension- patient states she does not have much time to talk as she was just "heading out the door'- she says she is doing fine, self monitoring blood pressure and blood sugar and she the readings have been good , she says she will have MRI of her left knee on 6/2 s the steroid injection she received on 03/20/20 did not provide effective relief.   Case Manager Clinical Goal(s):  Susan Holmes Over the next 30 days, patient will verbalize understanding of plan for hypertension management . Over the next 30 days, patient will attend all scheduled medical appointments:  . Over the next 30 days, patient will demonstrate improved adherence to prescribed treatment plan for hypertension as evidenced by taking all medications as prescribed, monitoring and recording blood pressure as directed, adhering to low sodium/DASH diet . Over the next 30 days, patient will demonstrate improved health management independence as evidenced by checking blood pressure as directed and notifying PCP if SBP>200 or DBP > 100, taking all medications as prescribe, and adhering to a low sodium diet as discussed.  Interventions:  . Assessed home BP readings, ensured she is taking her blood pressure medicines correctly . Assessed home blood  sugar readings  . Reviewed upcoming appointments for left knee MRI on 6/2 and appointment with Dr Marlou Sa (ortho) on 6/4 and podiatrist on 6/22 . Reviewed results of renal ultrasound done 4/16- no evidence of renal artery stenosis  Patient Self Care Activities:  . Able to independently manage blood pressure to meet target of <140/<90 and to manage blood sugar to meet Hgb A1C target . Self administers medications as prescribed . Attends all scheduled provider appointments . Calls provider office for new concerns, questions, or BP outside discussed parameters or seeks emergency assistance for symptoms of stroke . Checks BP and record as discussed . Checks blood sugar at least daily and calls provider for readings outside established parameters  Please see past updates related to this goal by clicking on the "Past Updates" button in the selected goal           Plan:   The care management team will  reach out to the patient again over the next 30 days.    Kelli Churn RN, CCM, [facility] RN Care Manager [phone]

## 2020-05-11 ENCOUNTER — Other Ambulatory Visit: Payer: Self-pay | Admitting: Internal Medicine

## 2020-05-12 NOTE — Progress Notes (Signed)
Internal Medicine Clinic Resident  I have personally reviewed this encounter including the documentation in this note and/or discussed this patient with the care management provider. I will address any urgent items identified by the care management provider and will communicate my actions to the patient's PCP. I have reviewed the patient's CCM visit with my supervising attending, Dr Raines.  Joshua K Lee, MD 05/12/2020   

## 2020-05-14 ENCOUNTER — Other Ambulatory Visit: Payer: Self-pay

## 2020-05-14 ENCOUNTER — Ambulatory Visit (INDEPENDENT_AMBULATORY_CARE_PROVIDER_SITE_OTHER): Payer: Medicare Other | Admitting: Internal Medicine

## 2020-05-14 DIAGNOSIS — R0981 Nasal congestion: Secondary | ICD-10-CM

## 2020-05-14 MED ORDER — FLUTICASONE PROPIONATE 50 MCG/ACT NA SUSP: 1.0000 | Freq: Every day | NASAL | 0 refills | Status: DC

## 2020-05-14 NOTE — Assessment & Plan Note (Addendum)
Patient reports one week of sinus congestion and frontal headaches. She denies any fevers. Patient has received both doses of COVID vaccine. She notes trying over the counter medication but does not recall the name at this time. She notes trying flonase in the past but is unable to recall whether this has helped or not. Discussed trial of flonase with sinus irrigation. Patient expresses understanding.

## 2020-05-14 NOTE — Progress Notes (Signed)
   CC: sinus congestion  This is a telephone encounter between Susan Holmes and Susan Holmes on 05/14/2020 for sinus problems - eyes puffy with congestion and headaches for one week duration. The visit was conducted with the patient located at home and [place] at [facility]. The patient's identity was confirmed using their DOB and current address. The patient has consented to being evaluated through a telephone encounter and understands the associated risks (an examination cannot be done and the patient may need to come in for an appointment) / benefits (allows the patient to remain at home, decreasing exposure to coronavirus). I personally spent 10 minutes on medical discussion.   HPI:  Susan Holmes is a 64 y.o. with PMH as below.   Please see A&P for assessment of the patient's acute and chronic medical conditions.   Past Medical History:  Diagnosis Date  . Anxiety   . Arthritis   . Chest pain 09/26/2018  . Congenital blindness    Left  eye  . Coronary artery disease    per cardiac cath 2015-- mild disease involving LAD and branches ,  LCFx and branches, and mild disease pRCA  . Depression   . Diabetes mellitus, type II ([place])    followed by pcp  . Glaucoma   . Glaucoma, both eyes   . Hepatitis B infection pt unsure   resolved  . History of kidney stones    right stone  . History of transient ischemic attack (TIA) 2017   per pt no residuals  . HTN (hypertension)    followed by pcp  . Hyperlipidemia   . PAD (peripheral artery disease) ([place])   . Poor dental hygiene   . Stroke ([place])   . Tobacco abuse    .5 PPD smoker   Review of Systems:  Negative except as stated in HPI.     Assessment & Plan:   See Encounters Tab for problem based charting.  Patient discussed with Dr. Evette Doffing

## 2020-05-14 NOTE — Progress Notes (Signed)
Internal Medicine Clinic Attending ° °Case discussed with Dr. Aslam  at the time of the visit.  We reviewed the resident’s history and pertinent patient test results.  I agree with the assessment, diagnosis, and plan of care documented in the resident’s note.  °

## 2020-05-14 NOTE — Addendum Note (Signed)
Addended by: Lalla Brothers T on: 05/14/2020 02:44 PM   Modules accepted: Level of Service

## 2020-05-28 ENCOUNTER — Other Ambulatory Visit: Payer: Self-pay

## 2020-05-28 ENCOUNTER — Ambulatory Visit
Admission: RE | Admit: 2020-05-28 | Discharge: 2020-05-28 | Disposition: A | Discharge status: Home / Self Care | Payer: Medicare Other | Source: Ambulatory Visit | Attending: Orthopedic Surgery | Admitting: Orthopedic Surgery

## 2020-05-28 DIAGNOSIS — M25562 Pain in left knee: Secondary | ICD-10-CM

## 2020-05-30 ENCOUNTER — Ambulatory Visit: Payer: Medicare Other | Admitting: Orthopedic Surgery

## 2020-05-31 ENCOUNTER — Ambulatory Visit
Admission: RE | Admit: 2020-05-31 | Discharge: 2020-05-31 | Disposition: A | Discharge status: Home / Self Care | Payer: Medicare Other | Source: Ambulatory Visit | Attending: Orthopedic Surgery | Admitting: Orthopedic Surgery

## 2020-06-05 ENCOUNTER — Ambulatory Visit: Payer: Medicare Other | Admitting: *Deleted

## 2020-06-05 ENCOUNTER — Other Ambulatory Visit: Payer: Self-pay | Admitting: Cardiovascular Disease

## 2020-06-05 DIAGNOSIS — E785 Hyperlipidemia, unspecified: Secondary | ICD-10-CM

## 2020-06-05 DIAGNOSIS — E1169 Type 2 diabetes mellitus with other specified complication: Secondary | ICD-10-CM

## 2020-06-05 DIAGNOSIS — E1165 Type 2 diabetes mellitus with hyperglycemia: Secondary | ICD-10-CM

## 2020-06-05 DIAGNOSIS — I10 Essential (primary) hypertension: Secondary | ICD-10-CM

## 2020-06-05 NOTE — Chronic Care Management (AMB) (Signed)
Chronic Care Management   Follow Up Note   06/05/2020 Name: Susan Holmes MRN: [id number] DOB: 10-23-1956  Referred by: [name], MD Reason for referral : Chronic Care Management (HTN, DM)   Susan Holmes is a 64 y.o. year old female who is a primary care patient of Aslam, Loralyn Freshwater, MD. The CCM team was consulted for assistance with chronic disease management and care coordination needs.    Review of patient status, including review of consultants reports, relevant laboratory and other test results, and collaboration with appropriate care team members and the patient's provider was performed as part of comprehensive patient evaluation and provision of chronic care management services.    SDOH (Social Determinants of Health) assessments performed: No See Care Plan activities for detailed interventions related to [facility])     Outpatient Encounter Medications as of 06/05/2020  Medication Sig  . hydrALAZINE (APRESOLINE) 10 MG tablet TAKE 1 TABLET(10 MG) BY MOUTH IN THE MORNING AND AT BEDTIME  . Accu-Chek FastClix Lancets MISC CHECK BLOOD SUGAR THREE TIMES DAILY AS DIRECTED.  Marland Kitchen amLODipine (NORVASC) 10 MG tablet Take 0.5 tablets (5 mg total) by mouth every morning.  Marland Kitchen aspirin EC 81 MG tablet Take 81 mg by mouth daily.  . Blood Glucose Monitoring Suppl (ACCU-CHEK GUIDE) w/Device KIT 1 each by Does not apply route 3 (three) times daily.  . carvedilol (COREG) 12.5 MG tablet TAKE 1 TABLET(12.5 MG) BY MOUTH TWICE DAILY (Patient taking differently: Take 12.5 mg by mouth 2 (two) times daily. )  . cyproheptadine (PERIACTIN) 4 MG tablet Take 4 mg by mouth 2 (two) times daily.  . diclofenac Sodium (VOLTAREN) 1 % GEL   . dorzolamide-timolol (COSOPT) 22.3-6.8 MG/ML ophthalmic solution Place 1 drop into both eyes every evening.   . DULoxetine (CYMBALTA) 30 MG capsule TAKE 1 CAPSULE(30 MG) BY MOUTH DAILY  . Ensure (ENSURE) Take 1 Can by mouth 2 (two) times daily between meals.  . fluticasone (FLONASE)  50 MCG/ACT nasal spray Place 1 spray into both nostrils daily.  . hydrOXYzine (ATARAX/VISTARIL) 25 MG tablet Take 25 mg by mouth 3 (three) times daily as needed for anxiety.  Marland Kitchen ibuprofen (ADVIL) 800 MG tablet   . Insulin Glargine (LANTUS SOLOSTAR) 100 UNIT/ML Solostar Pen Inject 28 Units into the skin at bedtime.  . insulin lispro (HUMALOG KWIKPEN) 100 UNIT/ML KwikPen Inject 0.05 mLs (5 Units total) into the skin 3 (three) times daily with meals.  . Insulin Pen Needle (VALUMARK PEN NEEDLES) 31G X 8 MM MISC 1 each by Does not apply route daily.  Marland Kitchen losartan (COZAAR) 100 MG tablet TAKE 1 TABLET(100 MG) BY MOUTH DAILY  . meloxicam (MOBIC) 15 MG tablet TAKE 1 TABLET(15 MG) BY MOUTH DAILY  . methocarbamol (ROBAXIN) 500 MG tablet Take 2 tablets (1,000 mg total) by mouth 4 (four) times daily. (Patient not taking: Reported on 04/01/2020)  . mirtazapine (REMERON) 45 MG tablet Take 45 mg by mouth at bedtime.  . Multiple Vitamin (MULTIVITAMIN WITH MINERALS) TABS tablet Take 1 tablet by mouth daily.  Glory Rosebush ULTRA test strip USE TO TEST BLOOD GLUCOSE THREE TIMES DAILY  . pantoprazole (PROTONIX) 40 MG tablet Take 40 mg by mouth daily.  . prednisoLONE acetate (PRED FORTE) 1 % ophthalmic suspension Place 1 drop into the right eye daily.  . rosuvastatin (CRESTOR) 20 MG tablet Take 1 tablet (20 mg total) by mouth at bedtime.  . traZODone (DESYREL) 100 MG tablet Take 300 mg by mouth at bedtime.   Marland Kitchen  TRULICITY 1.5 KV/4.2VZ SOPN ADMINISTER 1.5 MG UNDER THE SKIN EVERY SATURDAY   No facility-administered encounter medications on file as of 06/05/2020.     Objective:  BP Readings from Last 3 Encounters:  04/03/20 (!) 155/98  03/06/20 (!) 175/101  02/06/20 (!) 141/64   Lab Results  Component Value Date   HGBA1C 6.5 (A) 02/06/2020   HGBA1C 10.2 (A) 10/10/2019   HGBA1C 10.6 (A) 02/01/2019   Lab Results  Component Value Date   MICROALBUR 31.8 (H) 11/24/2017   LDLCALC 133 (H) 09/26/2018   CREATININE 0.98  04/03/2020   Lab Results  Component Value Date   CHOL 206 (H) 09/26/2018   HDL 53 09/26/2018   LDLCALC 133 (H) 09/26/2018   LDLDIRECT 183 (H) 03/01/2014   TRIG 100 09/26/2018   CHOLHDL 3.9 09/26/2018   Please update annual lipid profile (on statin), annual urine for protein, update A1C at next clinic visit- last one done 02/06/20- and counsel on need for diabetic eye exam    Goals Addressed              This Visit's Progress     Patient Stated   .  "My blood pressure is better but I'm having knee problems." (pt-stated)        CARE PLAN ENTRY (see longitudinal plan of care for additional care plan information)   Current Barriers:  Marland Kitchen Knowledge Deficits related to basic understanding of hypertension pathophysiology and self care management . Knowledge Deficits related to understanding of medications prescribed for management of hypertension- patient says she is doing fine, no longer has sinus congestion,  self monitoring blood pressure and blood sugar daily and she says all readings are meeting target, denies hypoglycemia, reports good medication taking behavior, denies symptoms of orthostatic hypotension, she says her left knee pain continues and says the pain has existed since her left fem-pop bypass surgery in October of 2020,  she had MRI of her left knee on 6/5 and she will meet with Dr Marlou Sa on 6/14 to discuss results, she says a previous steroid injection on 03/20/20 did not provide effective relief.   Case Manager Clinical Goal(s):  Marland Kitchen Over the next 30 days, patient will verbalize understanding of plan for hypertension management . Over the next 30 days, patient will attend all scheduled medical appointments:  . Over the next 30 days, patient will demonstrate improved adherence to prescribed treatment plan for hypertension as evidenced by taking all medications as prescribed, monitoring and recording blood pressure as directed, adhering to low sodium/DASH diet . Over the next 30  days, patient will demonstrate improved health management independence as evidenced by checking blood pressure as directed and notifying PCP if SBP>200 or DBP > 100, taking all medications as prescribe, and adhering to a low sodium diet as discussed.  Interventions:  . Appropriate assessments completed . Assessed home BP readings . Assessed home blood sugar readings  . Assessed medication taking behavior . Reviewed upcoming appointments with Dr Marlou Sa (ortho) on 6/14 and podiatrist on 6/22 and ensured she has transportation . Reminded patient she is overdue for annual diabetic eye exam   Patient Self Care Activities:  . Able to independently manage blood pressure to meet target of <140/<90 and to manage blood sugar to meet Hgb A1C target . Self administers medications as prescribed . Attends all scheduled provider appointments . Calls provider office for new concerns, questions, or BP outside discussed parameters or seeks emergency assistance for symptoms of stroke . Checks BP  and record as discussed . Checks blood sugar at least daily and calls provider for readings outside established parameters . Follows a low sodium diet/DASH diet . Eliminate energy drinks  Please see past updates related to this goal by clicking on the "Past Updates" button in the selected goal           Plan:   The care management team will reach out to the patient again over the next 30 days.    Kelli Churn RN, CCM, [facility] RN Care Manager [phone]

## 2020-06-05 NOTE — Patient Instructions (Signed)
Visit Information It was nice speaking with you today. I hope the MRI of your knee provides some information about the cause of your left knee pain.   Goals Addressed              This Visit's Progress     Patient Stated   .  "My blood pressure is better but I'm having knee problems." (pt-stated)        CARE PLAN ENTRY (see longitudinal plan of care for additional care plan information)   Current Barriers:  Marland Kitchen Knowledge Deficits related to basic understanding of hypertension pathophysiology and self care management . Knowledge Deficits related to understanding of medications prescribed for management of hypertension- patient says she is doing fine, no longer has sinus congestion,  self monitoring blood pressure and blood sugar daily and she says all readings are meeting target, denies hypoglycemia, reports good medication taking behavior, denies symptoms of orthostatic hypotension, she says her left knee pain continues and says the pain has existed since her left fem-pop bypass surgery in October of 2020,  she had MRI of her left knee on 6/5 and she will meet with Dr Marlou Sa on 6/14 to discuss results, she says a previous steroid injection on 03/20/20 did not provide effective relief.   Case Manager Clinical Goal(s):  Marland Kitchen Over the next 30 days, patient will verbalize understanding of plan for hypertension management . Over the next 30 days, patient will attend all scheduled medical appointments:  . Over the next 30 days, patient will demonstrate improved adherence to prescribed treatment plan for hypertension as evidenced by taking all medications as prescribed, monitoring and recording blood pressure as directed, adhering to low sodium/DASH diet . Over the next 30 days, patient will demonstrate improved health management independence as evidenced by checking blood pressure as directed and notifying PCP if SBP>200 or DBP > 100, taking all medications as prescribe, and adhering to a low sodium diet  as discussed.  Interventions:  . Appropriate assessments completed . Assessed home BP readings . Assessed home blood sugar readings  . Assessed medication taking behavior . Reminded patient she is overdue for annual diabetic eye exam . Reviewed upcoming appointments with Dr Marlou Sa (ortho) on 6/14 and podiatrist on 6/22 and ensured she has transportation   Patient Self Care Activities:  . Able to independently manage blood pressure to meet target of <140/<90 and to manage blood sugar to meet Hgb A1C target . Self administers medications as prescribed . Attends all scheduled provider appointments . Calls provider office for new concerns, questions, or BP outside discussed parameters or seeks emergency assistance for symptoms of stroke . Checks BP and record as discussed . Checks blood sugar at least daily and calls provider for readings outside established parameters . Follows a low sodium diet/DASH diet . Eliminate energy drinks  Please see past updates related to this goal by clicking on the "Past Updates" button in the selected goal          The patient verbalized understanding of instructions provided today and declined a print copy of patient instruction materials.   The care management team will reach out to the patient again over the next 30 days.   Kelli Churn RN, CCM, [facility] RN Care Manager [phone]

## 2020-06-06 NOTE — Addendum Note (Signed)
Addended by: Asencion Noble on: 06/06/2020 10:54 AM   Modules accepted: Orders

## 2020-06-06 NOTE — Progress Notes (Signed)
Internal Medicine Clinic Resident  I have personally reviewed this encounter including the documentation in this note and/or discussed this patient with the care management provider. I will address any urgent items identified by the care management provider and will communicate my actions to the patient's PCP. Ordered future labs for lipid and urine protein. Will need A1c checked on next visit and counsel about eye exam. I have reviewed the patient's CCM visit with my supervising attending, Dr Heber [place].  Asencion Noble, MD 06/06/2020

## 2020-06-09 ENCOUNTER — Ambulatory Visit (INDEPENDENT_AMBULATORY_CARE_PROVIDER_SITE_OTHER): Payer: Medicare Other | Admitting: Orthopedic Surgery

## 2020-06-09 DIAGNOSIS — M25562 Pain in left knee: Secondary | ICD-10-CM | POA: Diagnosis not present

## 2020-06-13 NOTE — Progress Notes (Signed)
Internal Medicine Clinic Attending  CCM services provided by the care management provider and their documentation were discussed with Dr. Krienke. We reviewed the pertinent findings, urgent action items addressed by the resident and non-urgent items to be addressed by the PCP.  I agree with the assessment, diagnosis, and plan of care documented in the CCM and resident's note.  Olita Takeshita C Dmiyah Liscano, DO 06/13/2020  

## 2020-06-14 ENCOUNTER — Encounter: Payer: Self-pay | Admitting: Orthopedic Surgery

## 2020-06-14 NOTE — Progress Notes (Signed)
Office Visit Note   Patient: Susan Holmes           Date of Birth: 02/14/1956           MRN: [id number] Visit Date: 06/09/2020 Requested by: [name], MD [address] [place],  [place] [address] PCP: [name], MD  Subjective: Chief Complaint  Patient presents with  . Follow-up    HPI: Shekelia is a 64 year old patient with knee pain who presents for follow-up of left knee MRIs.  Left knee MRI is reviewed with the patient and it is essentially normal.  No definitive pathology is present.  She does have a small focus of subcortical marrow edema along the lateral tibial plateau most of her pain is medial.  Denies any mechanical symptoms or effusion.              ROS: All systems reviewed are negative as they relate to the chief complaint within the history of present illness.  Patient denies  fevers or chills.   Assessment & Plan: Visit Diagnoses:  1. Left knee pain, unspecified chronicity     Plan: Impression is left knee pain with unclear etiology.  Does not look like referred pain from the hip or back.  MRI scan shows no definable treatable arthroscopic problems.  I would favor observation for now.  She is not had great relief with injections.  Nonweightbearing quad strengthening exercises encouraged.  Follow-up with [place] as needed.  Follow-Up Instructions: No follow-ups on file.   Orders:  No orders of the defined types were placed in this encounter.  No orders of the defined types were placed in this encounter.     Procedures: No procedures performed   Clinical Data: No additional findings.  Objective: Vital Signs: There were no vitals taken for this visit.  Physical Exam:   Constitutional: Patient appears well-developed HEENT:  Head: Normocephalic Eyes:EOM are normal Neck: Normal range of motion Cardiovascular: Normal rate Pulmonary/chest: Effort normal Neurologic: Patient is alert Skin: Skin is warm Psychiatric: Patient has normal mood  and affect    Ortho Exam: Ortho exam demonstrates full active and passive range of motion the left knee.  Does have no effusion today.  Collateral and cruciate ligaments are stable.  Mild tenderness around the incision from the arterial bypass.  I think this may or may not be related to that incision but in general she has good pedal pulses and the graft is working well.  I do not think that there is really any structural problem with the knee that we can address surgically.  Follow-up as needed  Specialty Comments:  No specialty comments available.  Imaging: No results found.   PMFS History: Patient Active Problem List   Diagnosis Date Noted  . Neck pain on left side 12/17/2019  . PAD (peripheral artery disease) ([place]) 10/22/2019  . Sinus congestion 10/11/2019  . PVD (peripheral vascular disease) ([place]) 10/04/2019  . Insulin dependent type 2 diabetes mellitus ([place]) 10/04/2019  . Claudication ([place]) 09/14/2019  . Left foot pain 07/12/2019  . Paronychia of great toe, left 06/21/2019  . Orthostatic hypotension 02/15/2019  . Diaphoresis 02/15/2019  . Nephrolithiasis 10/03/2018  . Left chest pressure 09/26/2018  . Upper back pain on right side 09/19/2018  . Callus of foot 09/19/2018  . Reactive airway disease 06/20/2018  . Unintentional weight loss 06/14/2018  . History of vitamin D deficiency 09/06/2017  . Forgetfulness 06/07/2017  . Dyspepsia 04/26/2017  . History of CVA (  cerebrovascular accident) 07/23/2016  . Cough 07/08/2016  . Lower back pain 08/06/2015  . At risk for polypharmacy 08/06/2015  . Osteoarthritis 12/11/2014  . History of chest pain 10/07/2014  . Pre-operative cardiovascular examination 10/07/2014  . Diarrhea 04/04/2014  . Liver hemangioma 04/04/2014  . RBC microcytosis 02/05/2014  . Hyperparathyroidism ([place]) 03/08/2013  . Calcific tendinitis of right shoulder 02/26/2012  . Fatigue 12/14/2011  . Uncontrolled type II diabetes mellitus ([place]) 06/12/2007  .  Hyperlipidemia associated with type 2 diabetes mellitus ([place]) 06/12/2007  . ANXIETY 06/12/2007  . TOBACCO ABUSE 06/12/2007  . Depression 06/12/2007  . CONGENITAL BLINDNESS, LEFT EYE 06/12/2007  . Essential hypertension 06/12/2007   Past Medical History:  Diagnosis Date  . Anxiety   . Arthritis   . Chest pain 09/26/2018  . Congenital blindness    Left  eye  . Coronary artery disease    per cardiac cath 2015-- mild disease involving LAD and branches ,  LCFx and branches, and mild disease pRCA  . Depression   . Diabetes mellitus, type II ([place])    followed by pcp  . Glaucoma   . Glaucoma, both eyes   . Hepatitis B infection pt unsure   resolved  . History of kidney stones    right stone  . History of transient ischemic attack (TIA) 2017   per pt no residuals  . HTN (hypertension)    followed by pcp  . Hyperlipidemia   . PAD (peripheral artery disease) ([place])   . Poor dental hygiene   . Stroke ([place])   . Tobacco abuse    .5 PPD smoker    Family History  Problem Relation Age of Onset  . Heart disease Mother 36  . Hypertension Mother   . CVA Mother   . Heart disease Father 65  . CVA Sister   . CVA Brother   . Aneurysm Brother   . Aneurysm Sister   . CVA Maternal Grandmother   . CVA Maternal Grandfather   . Colon cancer Neg Hx   . Esophageal cancer Neg Hx   . Stomach cancer Neg Hx     Past Surgical History:  Procedure Laterality Date  . ABDOMINAL AORTOGRAM W/LOWER EXTREMITY N/A 09/14/2019   Procedure: ABDOMINAL AORTOGRAM W/LOWER EXTREMITY;  Surgeon: Elam Dutch, MD;  Location: [facility];  Service: Cardiovascular;  Laterality: N/A;  . ABDOMINAL HYSTERECTOMY  1980s   unsure if ovaries were taken  . BYPASS GRAFT Left 10/22/2019    Left femoral to above-knee popliteal bypass using reversed ipsilateral greater saphenous vein  . colonoscopy    . FEMORAL-POPLITEAL BYPASS GRAFT Left 10/22/2019   Procedure: LEFT FEMORAL-ABOVE KNEE POPLITEAL  BYPASS GRAFT with  REVERSED GREATER SAPHENOUS VEIN;  Surgeon: Elam Dutch, MD;  Location: [facility];  Service: Vascular;  Laterality: Left;  . GLAUCOMA SURGERY Right 2017  . IR URETERAL STENT RIGHT NEW ACCESS W/O SEP NEPHROSTOMY CATH  10/03/2018  . LEFT HEART CATHETERIZATION WITH CORONARY ANGIOGRAM N/A 10/09/2014   Procedure: LEFT HEART CATHETERIZATION WITH CORONARY ANGIOGRAM;  Surgeon: Troy Sine, MD;  Location[facility];  Service: Cardiovascular;  Laterality: N/A;  . NEPHROLITHOTOMY Right 10/03/2018   Procedure: RIGHT NEPHROLITHOTOMY PERCUTANEOUS FIRST STAGE;  Surgeon: Irine Seal, MD;  Location: [facility];  Service: Urology;  Laterality: Right;  . PERIPHERAL VASCULAR INTERVENTION Left 09/14/2019   Procedure: PERIPHERAL VASCULAR INTERVENTION;  Surgeon: Elam Dutch, MD;  Location: [facility];  Service: Cardiovascular;  Laterality: Left;  left external iliac stent   . TEE WITHOUT CARDIOVERSION N/A 08/17/2016   Procedure: TRANSESOPHAGEAL ECHOCARDIOGRAM (TEE);  Surgeon: Sueanne Margarita, MD;  Location: [facility] ENDOSCOPY;  Service: Cardiovascular;  Laterality: N/A;   Social History   Occupational History    Employer: UNEMPLOYED  Tobacco Use  . Smoking status: Current Every Day Smoker    Packs/day: 0.50    Years: 39.00    Pack years: 19.50    Types: Cigarettes    Start date: 4  . Smokeless tobacco: Current User    Types: Chew  . Tobacco comment: half pack per day per patient report 10/25/2019  Vaping Use  . Vaping Use: Never used  Substance and Sexual Activity  . Alcohol use: No    Alcohol/week: 0.0 standard drinks  . Drug use: No  . Sexual activity: Not Currently    Birth control/protection: Surgical

## 2020-06-17 ENCOUNTER — Ambulatory Visit (INDEPENDENT_AMBULATORY_CARE_PROVIDER_SITE_OTHER): Payer: Medicare Other | Admitting: Podiatry

## 2020-06-17 ENCOUNTER — Other Ambulatory Visit: Payer: Self-pay

## 2020-06-17 ENCOUNTER — Encounter: Payer: Self-pay | Admitting: Podiatry

## 2020-06-17 VITALS — Temp 96.2°F

## 2020-06-17 DIAGNOSIS — E1151 Type 2 diabetes mellitus with diabetic peripheral angiopathy without gangrene: Secondary | ICD-10-CM

## 2020-06-17 DIAGNOSIS — L84 Corns and callosities: Secondary | ICD-10-CM

## 2020-06-17 DIAGNOSIS — M2042 Other hammer toe(s) (acquired), left foot: Secondary | ICD-10-CM

## 2020-06-17 DIAGNOSIS — B351 Tinea unguium: Secondary | ICD-10-CM

## 2020-06-17 DIAGNOSIS — M79674 Pain in right toe(s): Secondary | ICD-10-CM | POA: Diagnosis not present

## 2020-06-17 DIAGNOSIS — M2041 Other hammer toe(s) (acquired), right foot: Secondary | ICD-10-CM

## 2020-06-17 DIAGNOSIS — M79675 Pain in left toe(s): Secondary | ICD-10-CM

## 2020-06-17 NOTE — Patient Instructions (Signed)
Diabetes Mellitus and Foot Care Foot care is an important part of your health, especially when you have diabetes. Diabetes may cause you to have problems because of poor blood flow (circulation) to your feet and legs, which can cause your skin to:  Become thinner and drier.  Break more easily.  Heal more slowly.  Peel and crack. You may also have nerve damage (neuropathy) in your legs and feet, causing decreased feeling in them. This means that you may not notice minor injuries to your feet that could lead to more serious problems. Noticing and addressing any potential problems early is the best way to prevent future foot problems. How to care for your feet Foot hygiene  Wash your feet daily with warm water and mild soap. Do not use hot water. Then, pat your feet and the areas between your toes until they are completely dry. Do not soak your feet as this can dry your skin.  Trim your toenails straight across. Do not dig under them or around the cuticle. File the edges of your nails with an emery board or nail file.  Apply a moisturizing lotion or petroleum jelly to the skin on your feet and to dry, brittle toenails. Use lotion that does not contain alcohol and is unscented. Do not apply lotion between your toes. Shoes and socks  Wear clean socks or stockings every day. Make sure they are not too tight. Do not wear knee-high stockings since they may decrease blood flow to your legs.  Wear shoes that fit properly and have enough cushioning. Always look in your shoes before you put them on to be sure there are no objects inside.  To break in new shoes, wear them for just a few hours a day. This prevents injuries on your feet. Wounds, scrapes, corns, and calluses  Check your feet daily for blisters, cuts, bruises, sores, and redness. If you cannot see the bottom of your feet, use a mirror or ask someone for help.  Do not cut corns or calluses or try to remove them with medicine.  If you  find a minor scrape, cut, or break in the skin on your feet, keep it and the skin around it clean and dry. You may clean these areas with mild soap and water. Do not clean the area with peroxide, alcohol, or iodine.  If you have a wound, scrape, corn, or callus on your foot, look at it several times a day to make sure it is healing and not infected. Check for: ? Redness, swelling, or pain. ? Fluid or blood. ? Warmth. ? Pus or a bad smell. General instructions  Do not cross your legs. This may decrease blood flow to your feet.  Do not use heating pads or hot water bottles on your feet. They may burn your skin. If you have lost feeling in your feet or legs, you may not know this is happening until it is too late.  Protect your feet from hot and cold by wearing shoes, such as at the beach or on hot pavement.  Schedule a complete foot exam at least once a year (annually) or more often if you have foot problems. If you have foot problems, report any cuts, sores, or bruises to your health care provider immediately. Contact a health care provider if:  You have a medical condition that increases your risk of infection and you have any cuts, sores, or bruises on your feet.  You have an injury that is not   healing.  You have redness on your legs or feet.  You feel burning or tingling in your legs or feet.  You have pain or cramps in your legs and feet.  Your legs or feet are numb.  Your feet always feel cold.  You have pain around a toenail. Get help right away if:  You have a wound, scrape, corn, or callus on your foot and: ? You have pain, swelling, or redness that gets worse. ? You have fluid or blood coming from the wound, scrape, corn, or callus. ? Your wound, scrape, corn, or callus feels warm to the touch. ? You have pus or a bad smell coming from the wound, scrape, corn, or callus. ? You have a fever. ? You have a red line going up your leg. Summary  Check your feet every day  for cuts, sores, red spots, swelling, and blisters.  Moisturize feet and legs daily.  Wear shoes that fit properly and have enough cushioning.  If you have foot problems, report any cuts, sores, or bruises to your health care provider immediately.  Schedule a complete foot exam at least once a year (annually) or more often if you have foot problems. This information is not intended to replace advice given to you by your health care provider. Make sure you discuss any questions you have with your health care provider. Document Revised: 09/05/2019 Document Reviewed: 01/14/2017 Elsevier Patient Education  2020 [employer] Inc[employer]  

## 2020-06-22 NOTE — Progress Notes (Signed)
Subjective: Susan Holmes presents today at risk foot care. Pt has h/o NIDDM with PAD and painful callus(es) b/l and painful mycotic toenails b/l that are difficult to trim. Pain interferes with ambulation. Aggravating factors include wearing enclosed shoe gear. Pain is relieved with periodic professional debridement.  [name], MD is patient's PCP. Last visit was: 05/14/2020.  She voices no new pedal concerns on today's visit.  Past Medical History:  Diagnosis Date  . Anxiety   . Arthritis   . Chest pain 09/26/2018  . Congenital blindness    Left  eye  . Coronary artery disease    per cardiac cath 2015-- mild disease involving LAD and branches ,  LCFx and branches, and mild disease pRCA  . Depression   . Diabetes mellitus, type II ([place])    followed by pcp  . Glaucoma   . Glaucoma, both eyes   . Hepatitis B infection pt unsure   resolved  . History of kidney stones    right stone  . History of transient ischemic attack (TIA) 2017   per pt no residuals  . HTN (hypertension)    followed by pcp  . Hyperlipidemia   . PAD (peripheral artery disease) ([place])   . Poor dental hygiene   . Stroke ([place])   . Tobacco abuse    .5 PPD smoker     Patient Active Problem List   Diagnosis Date Noted  . Neck pain on left side 12/17/2019  . PAD (peripheral artery disease) ([place]) 10/22/2019  . Sinus congestion 10/11/2019  . PVD (peripheral vascular disease) ([place]) 10/04/2019  . Insulin dependent type 2 diabetes mellitus ([place]) 10/04/2019  . Claudication ([place]) 09/14/2019  . Left foot pain 07/12/2019  . Paronychia of great toe, left 06/21/2019  . Orthostatic hypotension 02/15/2019  . Diaphoresis 02/15/2019  . Nephrolithiasis 10/03/2018  . Left chest pressure 09/26/2018  . Upper back pain on right side 09/19/2018  . Callus of foot 09/19/2018  . Reactive airway disease 06/20/2018  . Unintentional weight loss 06/14/2018  . History of vitamin D deficiency 09/06/2017  . Forgetfulness  06/07/2017  . Dyspepsia 04/26/2017  . History of CVA (cerebrovascular accident) 07/23/2016  . Cough 07/08/2016  . Lower back pain 08/06/2015  . At risk for polypharmacy 08/06/2015  . Osteoarthritis 12/11/2014  . History of chest pain 10/07/2014  . Pre-operative cardiovascular examination 10/07/2014  . Diarrhea 04/04/2014  . Liver hemangioma 04/04/2014  . RBC microcytosis 02/05/2014  . Hyperparathyroidism ([place]) 03/08/2013  . Calcific tendinitis of right shoulder 02/26/2012  . Fatigue 12/14/2011  . Uncontrolled type II diabetes mellitus ([place]) 06/12/2007  . Hyperlipidemia associated with type 2 diabetes mellitus ([place]) 06/12/2007  . ANXIETY 06/12/2007  . TOBACCO ABUSE 06/12/2007  . Depression 06/12/2007  . CONGENITAL BLINDNESS, LEFT EYE 06/12/2007  . Essential hypertension 06/12/2007    Current Outpatient Medications on File Prior to Visit  Medication Sig Dispense Refill  . hydrALAZINE (APRESOLINE) 10 MG tablet TAKE 1 TABLET(10 MG) BY MOUTH IN THE MORNING AND AT BEDTIME 180 tablet 1  . Accu-Chek FastClix Lancets MISC CHECK BLOOD SUGAR THREE TIMES DAILY AS DIRECTED. 306 each 1  . amLODipine (NORVASC) 10 MG tablet Take 0.5 tablets (5 mg total) by mouth every morning. 45 tablet 1  . aspirin EC 81 MG tablet Take 81 mg by mouth daily.    . Blood Glucose Monitoring Suppl (ACCU-CHEK GUIDE) w/Device KIT 1 each by Does not apply route 3 (three) times daily. 1 kit 0  .  busPIRone (BUSPAR) 7.5 MG tablet Take 7.5 mg by mouth 3 (three) times daily.    . carvedilol (COREG) 12.5 MG tablet TAKE 1 TABLET(12.5 MG) BY MOUTH TWICE DAILY (Patient taking differently: Take 12.5 mg by mouth 2 (two) times daily. ) 60 tablet 6  . chlorthalidone (HYGROTON) 25 MG tablet Take 12.5 mg by mouth daily.    . cyproheptadine (PERIACTIN) 4 MG tablet Take 4 mg by mouth 2 (two) times daily.    . diclofenac Sodium (VOLTAREN) 1 % GEL     . dorzolamide-timolol (COSOPT) 22.3-6.8 MG/ML ophthalmic solution Place 1 drop into both  eyes every evening.     . DULoxetine (CYMBALTA) 30 MG capsule TAKE 1 CAPSULE(30 MG) BY MOUTH DAILY 90 capsule 2  . Ensure (ENSURE) Take 1 Can by mouth 2 (two) times daily between meals. 14220 mL 12  . fluticasone (FLONASE) 50 MCG/ACT nasal spray Place 1 spray into both nostrils daily. 18.2 mL 0  . gabapentin (NEURONTIN) 100 MG capsule Take 100 mg by mouth 3 (three) times daily.    Marland Kitchen gabapentin (NEURONTIN) 300 MG capsule Take 300 mg by mouth 3 (three) times daily.    . hydrOXYzine (ATARAX/VISTARIL) 25 MG tablet Take 25 mg by mouth 3 (three) times daily as needed for anxiety.    Marland Kitchen ibuprofen (ADVIL) 800 MG tablet     . Insulin Glargine (LANTUS SOLOSTAR) 100 UNIT/ML Solostar Pen Inject 28 Units into the skin at bedtime. 5 pen 5  . insulin lispro (HUMALOG KWIKPEN) 100 UNIT/ML KwikPen Inject 0.05 mLs (5 Units total) into the skin 3 (three) times daily with meals. 15 mL 11  . Insulin Pen Needle (VALUMARK PEN NEEDLES) 31G X 8 MM MISC 1 each by Does not apply route daily. 100 each 3  . lidocaine (XYLOCAINE) 5 % ointment APPLY TOPICALLY TO THE AFFECTED AREA EVERY NIGHT FOR RIGHT SHOULDER PAIN    . losartan (COZAAR) 100 MG tablet TAKE 1 TABLET(100 MG) BY MOUTH DAILY 90 tablet 1  . meloxicam (MOBIC) 15 MG tablet TAKE 1 TABLET(15 MG) BY MOUTH DAILY 90 tablet 0  . methocarbamol (ROBAXIN) 500 MG tablet Take 2 tablets (1,000 mg total) by mouth 4 (four) times daily. (Patient not taking: Reported on 04/01/2020) 30 tablet 0  . mirtazapine (REMERON) 45 MG tablet Take 45 mg by mouth at bedtime.    . Multiple Vitamin (MULTIVITAMIN WITH MINERALS) TABS tablet Take 1 tablet by mouth daily.    Marland Kitchen NARCAN 4 MG/0.1ML LIQD nasal spray kit SPRAY AS DIRECTED    . ONETOUCH ULTRA test strip USE TO TEST BLOOD GLUCOSE THREE TIMES DAILY 100 strip 12  . oxyCODONE-acetaminophen (PERCOCET) 10-325 MG tablet Take 1 tablet by mouth 3 (three) times daily as needed.    . pantoprazole (PROTONIX) 40 MG tablet Take 40 mg by mouth daily.    .  polyethylene glycol powder (GLYCOLAX/MIRALAX) 17 GM/SCOOP powder SMARTSIG:1 Scoopful By Mouth 4 Times Daily    . prednisoLONE acetate (PRED FORTE) 1 % ophthalmic suspension Place 1 drop into the right eye daily.    . pregabalin (LYRICA) 100 MG capsule Take 100 mg by mouth 2 (two) times daily.    . rosuvastatin (CRESTOR) 20 MG tablet Take 1 tablet (20 mg total) by mouth at bedtime. NEED OV. 30 tablet 2  . tiZANidine (ZANAFLEX) 4 MG tablet Take 4 mg by mouth 3 (three) times daily.    . traMADol (ULTRAM) 50 MG tablet Take 50 mg by mouth every 6 (six) hours as  needed.    . traZODone (DESYREL) 100 MG tablet Take 300 mg by mouth at bedtime.     . TRULICITY 1.5 SF/6.8LE SOPN ADMINISTER 1.5 MG UNDER THE SKIN EVERY SATURDAY 6 mL 1   No current facility-administered medications on file prior to visit.     Allergies  Allergen Reactions  . Ace Inhibitors Cough  . Lipitor [Atorvastatin] Other (See Comments)    MYALGIA   . Chantix [Varenicline] Nausea And Vomiting    Objective: REET SCHARRER is a pleasant 64 y.o. African American female WD, WN in NAD. AAO x 3.  Vitals:   06/17/20 1112  Temp: (!) 96.2 F (35.7 C)    Vascular Examination: Capillary refill time to digits immediate b/l. Palpable DP pulse(s) b/l lower extremities Nonpalpable PT pulse(s) b/l lower extremities. Pedal hair absent. Lower extremity skin temperature gradient within normal limits. No pain with calf compression b/l. No ischemia or gangrene noted b/l lower extremities.  Dermatological Examination: Pedal skin is thin shiny, atrophic b/l lower extremities. No open wounds bilaterally. No interdigital macerations bilaterally. Toenails 1-5 b/l elongated, discolored, dystrophic, thickened, crumbly with subungual debris and tenderness to dorsal palpation. Hyperkeratotic lesion(s) submet head 2 right foot, submet head 4 left foot, submet head 5 right foot and plantar aspect of heel .  No erythema, no edema, no drainage, no  flocculence.  Musculoskeletal: Normal muscle strength 5/5 to all lower extremity muscle groups bilaterally. No pain crepitus or joint limitation noted with ROM b/l. Hammertoes noted to the 2-5 bilaterally.  Neurological Examination: Protective sensation intact 5/5 intact bilaterally with 10g monofilament b/l. Vibratory sensation intact b/l. Proprioception intact bilaterally.  Last A1c: Hemoglobin A1C Latest Ref Rng & Units 02/06/2020 10/10/2019  HGBA1C 4.0 - 5.6 % 6.5(A) 10.2(A)  Some recent data might be hidden     Assessment: 1. Pain due to onychomycosis of toenails of both feet   2. Callus   3. Diabetes mellitus type 2 with peripheral artery disease ([place])   4. Acquired hammertoes of both feet    Plan: -Examined patient. -No new findings. No new orders. -Continue diabetic foot care principles. -Toenails 1-5 b/l were debrided in length and girth with sterile nail nippers and dremel without iatrogenic bleeding.  -Callus(es) submet head 2 right foot, submet head 4 left foot, submet head 5 right foot and plantar aspect of heel  pared utilizing sterile scalpel blade without complication or incident. Total number debrided =4. -Patient to report any pedal injuries to medical professional immediately. -Patient to continue soft, supportive shoe gear daily. -Patient/POA to call should there be question/concern in the interim.  Return in about 3 months (around 09/17/2020) for diabetic nail and callus trim.  [name], DPM

## 2020-06-24 ENCOUNTER — Other Ambulatory Visit: Payer: Medicare Other

## 2020-06-26 ENCOUNTER — Ambulatory Visit: Payer: Medicare Other | Admitting: Orthopedic Surgery

## 2020-07-02 ENCOUNTER — Ambulatory Visit: Payer: Medicare Other | Admitting: *Deleted

## 2020-07-02 DIAGNOSIS — E1169 Type 2 diabetes mellitus with other specified complication: Secondary | ICD-10-CM

## 2020-07-02 DIAGNOSIS — E785 Hyperlipidemia, unspecified: Secondary | ICD-10-CM

## 2020-07-02 DIAGNOSIS — I10 Essential (primary) hypertension: Secondary | ICD-10-CM

## 2020-07-02 DIAGNOSIS — E119 Type 2 diabetes mellitus without complications: Secondary | ICD-10-CM

## 2020-07-02 NOTE — Patient Instructions (Signed)
Visit Information It was nice speaking with you today. I will contact your provider about your request for OP PT to address your left knee and leg pain.  Goals Addressed              This Visit's Progress     Patient Stated   .  "My blood pressure is better but I'm having knee problems." (pt-stated)        CARE PLAN ENTRY (see longitudinal plan of care for additional care plan information)   Current Barriers:  Marland Kitchen Knowledge Deficits related to basic understanding of hypertension pathophysiology and self care management . Knowledge Deficits related to understanding of medications prescribed for management of hypertension- patient says she is still bothered by left knee pain that has existed since her left fem-pop bypass surgery in October of 2020, says she saw Dr. Marlou Sa (ortho) on 6/14 and he told her the MRI of her knee was normal, she is requesting OP PT as steroid injections did not help , she says she is self monitoring blood pressure and blood sugar daily and she says all readings are meeting target, denies hypoglycemia, reports good medication taking behavior, denies symptoms of orthostatic hypotension.  Case Manager Clinical Goal(s):  Marland Kitchen Over the next 30- 60 days, patient will verbalize understanding of plan for hypertension management . Over the next 30 - 60 days, patient will attend all scheduled medical appointments:  . Over the next 30 -60 days, patient will demonstrate improved adherence to prescribed treatment plan for hypertension as evidenced by taking all medications as prescribed, monitoring and recording blood pressure as directed, adhering to low sodium/DASH diet . Over the next 30 - 60 days, patient will demonstrate improved health management independence as evidenced by checking blood pressure as directed and notifying PCP if SBP>200 or DBP > 100, taking all medications as prescribe, and adhering to a low sodium diet as discussed.  Interventions:  . Appropriate  assessments completed . Assessed home BP readings . Assessed home blood sugar readings  . Assessed medication taking behavior . Assessed c/o left knee and leg pain . Messaged provider that patient is requesting OP PT and that patient is aware she may need to be seen in clinic to evaluate need for OP PT . Again reminded patient she is overdue for annual diabetic eye exam . Reviewed past appointments with podiatrist and orthopedist on 6/22    Patient Self Care Activities:  . Able to independently manage blood pressure to meet target of <140/<90 and to manage blood sugar to meet Hgb A1C target . Self administers medications as prescribed . Attends all scheduled provider appointments . Calls provider office for new concerns, questions, or BP outside discussed parameters or seeks emergency assistance for symptoms of stroke . Checks BP and record as discussed . Checks blood sugar at least daily and calls provider for readings outside established parameters . Follows a low sodium diet/DASH diet . Eliminate energy drinks  Please see past updates related to this goal by clicking on the "Past Updates" button in the selected goal          The patient verbalized understanding of instructions provided today and declined a print copy of patient instruction materials.   The care management team will reach out to the patient again over the next 30-60 days.   Kelli Churn RN, CCM, [facility] RN Care Manager [phone]

## 2020-07-02 NOTE — Chronic Care Management (AMB) (Signed)
Chronic Care Management   Follow Up Note   07/02/2020 Name: Susan Holmes MRN: [id number] DOB: Aug 10, 1956  Referred by: [name], MD Reason for referral : Chronic Care Management (IDDM, HTN)   Susan Holmes is a 64 y.o. year old female who is a primary care patient of Aslam, Loralyn Freshwater, MD. The CCM team was consulted for assistance with chronic disease management and care coordination needs.    Review of patient status, including review of consultants reports, relevant laboratory and other test results, and collaboration with appropriate care team members and the patient's provider was performed as part of comprehensive patient evaluation and provision of chronic care management services.    SDOH (Social Determinants of Health) assessments performed: No See Care Plan activities for detailed interventions related to [facility])     Outpatient Encounter Medications as of 07/02/2020  Medication Sig  . hydrALAZINE (APRESOLINE) 10 MG tablet TAKE 1 TABLET(10 MG) BY MOUTH IN THE MORNING AND AT BEDTIME  . Accu-Chek FastClix Lancets MISC CHECK BLOOD SUGAR THREE TIMES DAILY AS DIRECTED.  Marland Kitchen amLODipine (NORVASC) 10 MG tablet Take 0.5 tablets (5 mg total) by mouth every morning.  Marland Kitchen aspirin EC 81 MG tablet Take 81 mg by mouth daily.  . Blood Glucose Monitoring Suppl (ACCU-CHEK GUIDE) w/Device KIT 1 each by Does not apply route 3 (three) times daily.  . busPIRone (BUSPAR) 7.5 MG tablet Take 7.5 mg by mouth 3 (three) times daily.  . carvedilol (COREG) 12.5 MG tablet TAKE 1 TABLET(12.5 MG) BY MOUTH TWICE DAILY (Patient taking differently: Take 12.5 mg by mouth 2 (two) times daily. )  . chlorthalidone (HYGROTON) 25 MG tablet Take 12.5 mg by mouth daily.  . cyproheptadine (PERIACTIN) 4 MG tablet Take 4 mg by mouth 2 (two) times daily.  . diclofenac Sodium (VOLTAREN) 1 % GEL   . dorzolamide-timolol (COSOPT) 22.3-6.8 MG/ML ophthalmic solution Place 1 drop into both eyes every evening.   . DULoxetine (CYMBALTA)  30 MG capsule TAKE 1 CAPSULE(30 MG) BY MOUTH DAILY  . Ensure (ENSURE) Take 1 Can by mouth 2 (two) times daily between meals.  . fluticasone (FLONASE) 50 MCG/ACT nasal spray Place 1 spray into both nostrils daily.  Marland Kitchen gabapentin (NEURONTIN) 100 MG capsule Take 100 mg by mouth 3 (three) times daily.  Marland Kitchen gabapentin (NEURONTIN) 300 MG capsule Take 300 mg by mouth 3 (three) times daily.  . hydrOXYzine (ATARAX/VISTARIL) 25 MG tablet Take 25 mg by mouth 3 (three) times daily as needed for anxiety.  Marland Kitchen ibuprofen (ADVIL) 800 MG tablet   . Insulin Glargine (LANTUS SOLOSTAR) 100 UNIT/ML Solostar Pen Inject 28 Units into the skin at bedtime.  . insulin lispro (HUMALOG KWIKPEN) 100 UNIT/ML KwikPen Inject 0.05 mLs (5 Units total) into the skin 3 (three) times daily with meals.  . Insulin Pen Needle (VALUMARK PEN NEEDLES) 31G X 8 MM MISC 1 each by Does not apply route daily.  Marland Kitchen lidocaine (XYLOCAINE) 5 % ointment APPLY TOPICALLY TO THE AFFECTED AREA EVERY NIGHT FOR RIGHT SHOULDER PAIN  . losartan (COZAAR) 100 MG tablet TAKE 1 TABLET(100 MG) BY MOUTH DAILY  . meloxicam (MOBIC) 15 MG tablet TAKE 1 TABLET(15 MG) BY MOUTH DAILY  . methocarbamol (ROBAXIN) 500 MG tablet Take 2 tablets (1,000 mg total) by mouth 4 (four) times daily. (Patient not taking: Reported on 04/01/2020)  . mirtazapine (REMERON) 45 MG tablet Take 45 mg by mouth at bedtime.  . Multiple Vitamin (MULTIVITAMIN WITH MINERALS) TABS tablet Take 1 tablet by mouth  daily.  Marland Kitchen NARCAN 4 MG/0.1ML LIQD nasal spray kit SPRAY AS DIRECTED  . ONETOUCH ULTRA test strip USE TO TEST BLOOD GLUCOSE THREE TIMES DAILY  . oxyCODONE-acetaminophen (PERCOCET) 10-325 MG tablet Take 1 tablet by mouth 3 (three) times daily as needed.  . pantoprazole (PROTONIX) 40 MG tablet Take 40 mg by mouth daily.  . polyethylene glycol powder (GLYCOLAX/MIRALAX) 17 GM/SCOOP powder SMARTSIG:1 Scoopful By Mouth 4 Times Daily  . prednisoLONE acetate (PRED FORTE) 1 % ophthalmic suspension Place 1 drop  into the right eye daily.  . pregabalin (LYRICA) 100 MG capsule Take 100 mg by mouth 2 (two) times daily.  . rosuvastatin (CRESTOR) 20 MG tablet Take 1 tablet (20 mg total) by mouth at bedtime. NEED OV.  Marland Kitchen tiZANidine (ZANAFLEX) 4 MG tablet Take 4 mg by mouth 3 (three) times daily.  . traMADol (ULTRAM) 50 MG tablet Take 50 mg by mouth every 6 (six) hours as needed.  . traZODone (DESYREL) 100 MG tablet Take 300 mg by mouth at bedtime.   . TRULICITY 1.5 LO/7.5IE SOPN ADMINISTER 1.5 MG UNDER THE SKIN EVERY SATURDAY   No facility-administered encounter medications on file as of 07/02/2020.     Objective:   Goals Addressed              This Visit's Progress     Patient Stated   .  "My blood pressure is better but I'm having knee problems." (pt-stated)        CARE PLAN ENTRY (see longitudinal plan of care for additional care plan information)   Current Barriers:  Marland Kitchen Knowledge Deficits related to basic understanding of hypertension pathophysiology and self care management . Knowledge Deficits related to understanding of medications prescribed for management of hypertension- patient says she is still bothered by left knee pain that has existed since her left fem-pop bypass surgery in October of 2020, says she saw Dr. Marlou Sa (ortho) on 6/14 and he told her the MRI of her knee was normal, she is requesting OP PT as steroid injections did not help , she says she is self monitoring blood pressure and blood sugar daily and she says all readings are meeting target, denies hypoglycemia, reports good medication taking behavior, denies symptoms of orthostatic hypotension.  Case Manager Clinical Goal(s):  Marland Kitchen Over the next 30- 60 days, patient will verbalize understanding of plan for hypertension management . Over the next 30 - 60 days, patient will attend all scheduled medical appointments:  . Over the next 30 -60 days, patient will demonstrate improved adherence to prescribed treatment plan for  hypertension as evidenced by taking all medications as prescribed, monitoring and recording blood pressure as directed, adhering to low sodium/DASH diet . Over the next 30 - 60 days, patient will demonstrate improved health management independence as evidenced by checking blood pressure as directed and notifying PCP if SBP>200 or DBP > 100, taking all medications as prescribe, and adhering to a low sodium diet as discussed.  Interventions:  . Appropriate assessments completed . Assessed home BP readings . Assessed home blood sugar readings  . Assessed medication taking behavior . Assessed c/o left knee and leg pain . Messaged provider that patient is requesting OP PT and that patient is aware she may need to be seen in clinic to evaluate need for OP PT . Again reminded patient she is overdue for annual diabetic eye exam . Reviewed past appointments with podiatrist and orthopedist on 6/22    Patient Self Care Activities:  .  Able to independently manage blood pressure to meet target of <140/<90 and to manage blood sugar to meet Hgb A1C target . Self administers medications as prescribed . Attends all scheduled provider appointments . Calls provider office for new concerns, questions, or BP outside discussed parameters or seeks emergency assistance for symptoms of stroke . Checks BP and record as discussed . Checks blood sugar at least daily and calls provider for readings outside established parameters . Follows a low sodium diet/DASH diet . Eliminate energy drinks  Please see past updates related to this goal by clicking on the "Past Updates" button in the selected goal           Plan:   The care management team will reach out to the patient again over the next 30-60 days.    Kelli Churn RN, CCM, [facility] RN Care Manager [phone]

## 2020-08-04 ENCOUNTER — Other Ambulatory Visit: Payer: Self-pay | Admitting: Internal Medicine

## 2020-08-04 DIAGNOSIS — E119 Type 2 diabetes mellitus without complications: Secondary | ICD-10-CM

## 2020-08-07 ENCOUNTER — Telehealth: Payer: Medicare Other

## 2020-08-14 ENCOUNTER — Ambulatory Visit: Payer: Medicare Other | Admitting: *Deleted

## 2020-08-14 ENCOUNTER — Ambulatory Visit (INDEPENDENT_AMBULATORY_CARE_PROVIDER_SITE_OTHER): Payer: Medicare Other | Admitting: Orthopedic Surgery

## 2020-08-14 ENCOUNTER — Other Ambulatory Visit: Payer: Self-pay | Admitting: *Deleted

## 2020-08-14 ENCOUNTER — Ambulatory Visit: Payer: Self-pay

## 2020-08-14 DIAGNOSIS — M79605 Pain in left leg: Secondary | ICD-10-CM | POA: Diagnosis not present

## 2020-08-14 DIAGNOSIS — E119 Type 2 diabetes mellitus without complications: Secondary | ICD-10-CM

## 2020-08-14 DIAGNOSIS — I10 Essential (primary) hypertension: Secondary | ICD-10-CM

## 2020-08-14 DIAGNOSIS — E1169 Type 2 diabetes mellitus with other specified complication: Secondary | ICD-10-CM

## 2020-08-14 MED ORDER — LANTUS SOLOSTAR 100 UNIT/ML ~~LOC~~ SOPN: 28.0000 [IU] | PEN_INJECTOR | Freq: Every day | SUBCUTANEOUS | 1 refills | Status: DC

## 2020-08-14 NOTE — Progress Notes (Signed)
Internal Medicine Clinic Resident  I have personally reviewed this encounter including the documentation in this note and/or discussed this patient with the care management provider. I will address any urgent items identified by the care management provider and will communicate my actions to the patient's PCP. I have reviewed the patient's CCM visit with my supervising attending, Dr Guilloud.  Jairo Bellew Y Maycen Degregory, MD 08/14/2020    

## 2020-08-14 NOTE — Chronic Care Management (AMB) (Signed)
Chronic Care Management   Follow Up Note   08/14/2020 Name: Susan Holmes MRN: [id number] DOB: 1956/06/06  Referred by: [name], MD Reason for referral : Chronic Care Management (IDDM, HTN, HLD)   Susan Holmes is a 64 y.o. year old female who is a primary care patient of Aslam, Loralyn Freshwater, MD. The CCM team was consulted for assistance with chronic disease management and care coordination needs.    Review of patient status, including review of consultants reports, relevant laboratory and other test results, and collaboration with appropriate care team members and the patient's provider was performed as part of comprehensive patient evaluation and provision of chronic care management services.    SDOH (Social Determinants of Health) assessments performed: No See Care Plan activities for detailed interventions related to [facility])     Outpatient Encounter Medications as of 08/14/2020  Medication Sig  . hydrALAZINE (APRESOLINE) 10 MG tablet TAKE 1 TABLET(10 MG) BY MOUTH IN THE MORNING AND AT BEDTIME  . Accu-Chek FastClix Lancets MISC CHECK BLOOD SUGAR THREE TIMES DAILY AS DIRECTED.  Marland Kitchen amLODipine (NORVASC) 10 MG tablet Take 0.5 tablets (5 mg total) by mouth every morning.  Marland Kitchen aspirin EC 81 MG tablet Take 81 mg by mouth daily.  . Blood Glucose Monitoring Suppl (ACCU-CHEK GUIDE) w/Device KIT 1 each by Does not apply route 3 (three) times daily.  . busPIRone (BUSPAR) 7.5 MG tablet Take 7.5 mg by mouth 3 (three) times daily.  . carvedilol (COREG) 12.5 MG tablet TAKE 1 TABLET(12.5 MG) BY MOUTH TWICE DAILY (Patient taking differently: Take 12.5 mg by mouth 2 (two) times daily. )  . chlorthalidone (HYGROTON) 25 MG tablet Take 12.5 mg by mouth daily.  . cyproheptadine (PERIACTIN) 4 MG tablet Take 4 mg by mouth 2 (two) times daily.  . diclofenac Sodium (VOLTAREN) 1 % GEL   . dorzolamide-timolol (COSOPT) 22.3-6.8 MG/ML ophthalmic solution Place 1 drop into both eyes every evening.   . DULoxetine  (CYMBALTA) 30 MG capsule TAKE 1 CAPSULE(30 MG) BY MOUTH DAILY  . Ensure (ENSURE) Take 1 Can by mouth 2 (two) times daily between meals.  . fluticasone (FLONASE) 50 MCG/ACT nasal spray Place 1 spray into both nostrils daily.  Marland Kitchen gabapentin (NEURONTIN) 100 MG capsule Take 100 mg by mouth 3 (three) times daily.  Marland Kitchen gabapentin (NEURONTIN) 300 MG capsule Take 300 mg by mouth 3 (three) times daily.  . hydrOXYzine (ATARAX/VISTARIL) 25 MG tablet Take 25 mg by mouth 3 (three) times daily as needed for anxiety.  Marland Kitchen ibuprofen (ADVIL) 800 MG tablet   . Insulin Glargine (LANTUS SOLOSTAR) 100 UNIT/ML Solostar Pen Inject 28 Units into the skin at bedtime.  . insulin lispro (HUMALOG KWIKPEN) 100 UNIT/ML KwikPen Inject 0.05 mLs (5 Units total) into the skin 3 (three) times daily with meals.  . Insulin Pen Needle (VALUMARK PEN NEEDLES) 31G X 8 MM MISC 1 each by Does not apply route daily.  Marland Kitchen lidocaine (XYLOCAINE) 5 % ointment APPLY TOPICALLY TO THE AFFECTED AREA EVERY NIGHT FOR RIGHT SHOULDER PAIN  . losartan (COZAAR) 100 MG tablet TAKE 1 TABLET(100 MG) BY MOUTH DAILY  . meloxicam (MOBIC) 15 MG tablet TAKE 1 TABLET(15 MG) BY MOUTH DAILY  . methocarbamol (ROBAXIN) 500 MG tablet Take 2 tablets (1,000 mg total) by mouth 4 (four) times daily. (Patient not taking: Reported on 04/01/2020)  . mirtazapine (REMERON) 45 MG tablet Take 45 mg by mouth at bedtime.  . Multiple Vitamin (MULTIVITAMIN WITH MINERALS) TABS tablet Take 1 tablet by  mouth daily.  Marland Kitchen NARCAN 4 MG/0.1ML LIQD nasal spray kit SPRAY AS DIRECTED  . ONETOUCH ULTRA test strip USE TO TEST BLOOD GLUCOSE THREE TIMES DAILY  . oxyCODONE-acetaminophen (PERCOCET) 10-325 MG tablet Take 1 tablet by mouth 3 (three) times daily as needed.  . pantoprazole (PROTONIX) 40 MG tablet Take 40 mg by mouth daily.  . polyethylene glycol powder (GLYCOLAX/MIRALAX) 17 GM/SCOOP powder SMARTSIG:1 Scoopful By Mouth 4 Times Daily  . prednisoLONE acetate (PRED FORTE) 1 % ophthalmic suspension  Place 1 drop into the right eye daily.  . pregabalin (LYRICA) 100 MG capsule Take 100 mg by mouth 2 (two) times daily.  . rosuvastatin (CRESTOR) 20 MG tablet Take 1 tablet (20 mg total) by mouth at bedtime. NEED OV.  Marland Kitchen tiZANidine (ZANAFLEX) 4 MG tablet Take 4 mg by mouth 3 (three) times daily.  . traMADol (ULTRAM) 50 MG tablet Take 50 mg by mouth every 6 (six) hours as needed.  . traZODone (DESYREL) 100 MG tablet Take 300 mg by mouth at bedtime.   . TRULICITY 1.5 AS/5.0NL SOPN ADMINISTER 1.5 MG UNDER THE SKIN EVERY SATURDAY   No facility-administered encounter medications on file as of 08/14/2020.     Objective:   Goals Addressed              This Visit's Progress     Patient Stated   .  "My blood pressure is better but I'm having knee problems." (pt-stated)        CARE PLAN ENTRY (see longitudinal plan of care for additional care plan information)   Current Barriers:  Marland Kitchen Knowledge Deficits related to basic understanding of hypertension pathophysiology and self care management . Knowledge Deficits related to understanding of medications prescribed for management of hypertension- patient says she is still bothered by left knee and leg pain that has existed since her left fem-pop bypass surgery in October of 2020, says she saw Dr. Marlou Sa (ortho) today and he took X-rays of her back and referred her for a doppler of her lower extremities tomorrow to check for DVT, he told her at her June visit the MRI of her knee was normal, she is requesting OP PT as steroid injections did not help , she says she is not self monitoring her blood pressure at present but that her blood pressure at Dr Randel Pigg office today was "OK", she reports daily blood readings are meeting target, denies hypoglycemia, reports good medication taking behavior, denies symptoms of orthostatic hypotension.  Case Manager Clinical Goal(s):  Marland Kitchen Over the next 30- 60 days, patient will verbalize understanding of plan for hypertension  management . Over the next 30 - 60 days, patient will attend all scheduled medical appointments:  . Over the next 30 -60 days, patient will demonstrate improved adherence to prescribed treatment plan for hypertension as evidenced by taking all medications as prescribed, monitoring and recording blood pressure as directed, adhering to low sodium/DASH diet . Over the next 30 - 60 days, patient will demonstrate improved health management independence as evidenced by checking blood pressure as directed and notifying PCP if SBP>200 or DBP > 100, taking all medications as prescribe, and adhering to a low sodium diet as discussed.  Interventions:  . Appropriate assessments completed . Assessed home BP readings . Assessed home blood sugar readings  . Assessed medication taking behavior . Assessed c/o left knee and leg pain . Again reminded patient she is overdue for annual diabetic eye exam . Requested she schedule clinic appointment to complete  her heath maintenance  visit  . Explained indication for dopplers of LE tomorrow and directed her on how to find the vascular lab at [facility] . Reviewed upcoming appointment with podiatrist on 09/26/20   Patient Self Care Activities:  . Able to independently manage blood pressure to meet target of <140/<90 and to manage blood sugar to meet Hgb A1C target . Self administers medications as prescribed . Attends all scheduled provider appointments . Calls provider office for new concerns, questions, or BP outside discussed parameters or seeks emergency assistance for symptoms of stroke . Checks BP and record as discussed . Checks blood sugar at least daily and calls provider for readings outside established parameters . Follows a low sodium diet/DASH diet . Eliminate energy drinks  Please see past updates related to this goal by clicking on the "Past Updates" button in the selected goal           Plan:   The care management team will reach out to  the patient again over the next 30-60 days.    Kelli Churn RN, CCM, [facility] RN Care Manager [phone]

## 2020-08-14 NOTE — Progress Notes (Signed)
Internal Medicine Clinic Attending  CCM services provided by the care management provider and their documentation were discussed with Dr. Bridgett Larsson. We reviewed the pertinent findings, urgent action items addressed by the resident and non-urgent items to be addressed by the PCP.  I agree with the assessment, diagnosis, and plan of care documented in the CCM and resident's note.  Velna Ochs, MD 08/14/2020

## 2020-08-14 NOTE — Patient Instructions (Signed)
Visit Information It was nice speaking with you today.  Goals Addressed              This Visit's Progress     Patient Stated   .  "My blood pressure is better but I'm having knee problems." (pt-stated)        CARE PLAN ENTRY (see longitudinal plan of care for additional care plan information)   Current Barriers:  Marland Kitchen Knowledge Deficits related to basic understanding of hypertension pathophysiology and self care management . Knowledge Deficits related to understanding of medications prescribed for management of hypertension- patient says she is still bothered by left knee and leg pain that has existed since her left fem-pop bypass surgery in October of 2020, says she saw Dr. Marlou Sa (ortho) today and he took X-rays of her back and referred her for a doppler of her lower extremities tomorrow to check for DVT, he told her at her June visit the MRI of her knee was normal, she is requesting OP PT as steroid injections did not help , she says she is not self monitoring her blood pressure at present but that her blood pressure at Dr Randel Pigg office today was "OK", she reports daily blood readings are meeting target, denies hypoglycemia, reports good medication taking behavior, denies symptoms of orthostatic hypotension.  Case Manager Clinical Goal(s):  Marland Kitchen Over the next 30- 60 days, patient will verbalize understanding of plan for hypertension management . Over the next 30 - 60 days, patient will attend all scheduled medical appointments:  . Over the next 30 -60 days, patient will demonstrate improved adherence to prescribed treatment plan for hypertension as evidenced by taking all medications as prescribed, monitoring and recording blood pressure as directed, adhering to low sodium/DASH diet . Over the next 30 - 60 days, patient will demonstrate improved health management independence as evidenced by checking blood pressure as directed and notifying PCP if SBP>200 or DBP > 100, taking all medications as  prescribe, and adhering to a low sodium diet as discussed.  Interventions:  . Appropriate assessments completed . Assessed home BP readings . Assessed home blood sugar readings  . Assessed medication taking behavior . Assessed c/o left knee and leg pain . Again reminded patient she is overdue for annual diabetic eye exam . Requested she schedule clinic appointment to complete her heath maintenance  visit  . Explained indication for dopplers of LE tomorrow and directed her on how to find the vascular lab at [facility] . Reviewed upcoming appointment with podiatrist on 09/26/20   Patient Self Care Activities:  . Able to independently manage blood pressure to meet target of <140/<90 and to manage blood sugar to meet Hgb A1C target . Self administers medications as prescribed . Attends all scheduled provider appointments . Calls provider office for new concerns, questions, or BP outside discussed parameters or seeks emergency assistance for symptoms of stroke . Checks BP and record as discussed . Checks blood sugar at least daily and calls provider for readings outside established parameters . Follows a low sodium diet/DASH diet . Eliminate energy drinks  Please see past updates related to this goal by clicking on the "Past Updates" button in the selected goal          The patient verbalized understanding of instructions provided today and declined a print copy of patient instruction materials.   The care management team will reach out to the patient again over the next 30-60 days.   Kelli Churn RN, CCM,  [facility] RN Care Manager [phone]

## 2020-08-15 ENCOUNTER — Telehealth: Payer: Self-pay | Admitting: *Deleted

## 2020-08-15 ENCOUNTER — Ambulatory Visit (HOSPITAL_COMMUNITY)
Admission: RE | Admit: 2020-08-15 | Discharge: 2020-08-15 | Disposition: A | Discharge status: Home / Self Care | Payer: Medicare Other | Source: Ambulatory Visit | Attending: Orthopedic Surgery | Admitting: Orthopedic Surgery

## 2020-08-15 ENCOUNTER — Other Ambulatory Visit: Payer: Self-pay

## 2020-08-15 DIAGNOSIS — M79605 Pain in left leg: Secondary | ICD-10-CM | POA: Insufficient documentation

## 2020-08-15 NOTE — Progress Notes (Signed)
Left lower ext. Venous  has been completed. Refer to [facility] under chart review to view preliminary results. Two failed attempts to contact [facility] at [phone] with results.   08/15/2020  3:14 PM Presleigh Feldstein, Bonnye Fava

## 2020-08-15 NOTE — Telephone Encounter (Addendum)
Annual Wellness Visit   Medicare Covered Preventative Screenings and Services  Services & Screenings Men and Women Who How Often Need? Date of Last Service Action  Abdominal Aortic Aneurysm Adults with AAA risk factors Once     Alcohol Misuse and Counseling All Adults Screening once a year if no alcohol misuse. Counseling up to 4 face to face sessions.     Bone Density Measurement  Adults at risk for osteoporosis Once every 2 yrs     Lipid Panel Z13.6 All adults without CV disease Once every 5 yrs X 09/26/2018   Colorectal Cancer   Stool sample or  Colonoscopy All adults 41 and older   Once every year  Every 10 years     Depression All Adults Once a year  Today   Diabetes Screening Blood glucose, post glucose load, or GTT Z13.1  All adults at risk  Pre-diabetics  Once per year  Twice per year     Diabetes  Self-Management Training All adults Diabetics 10 hrs first year; 2 hours subsequent years. Requires Copay     Glaucoma  Diabetics  Family history of glaucoma  African Americans 66 yrs +  Hispanic Americans 5 yrs + Annually - requires coppay     Hepatitis C Z72.89 or F19.20  High Risk for HCV  Born between 1945 and 1965  Annually  Once     HIV Z11.4 All adults based on risk  Annually btw ages 29 & 59 regardless of risk  Annually > 65 yrs if at increased risk     Lung Cancer Screening Asymptomatic adults aged 76-77 with 30 pack yr history and current smoker OR quit within the last 15 yrs Annually Must have counseling and shared decision making documentation before first screen     Medical Nutrition Therapy Adults with   Diabetes  Renal disease  Kidney transplant within past 3 yrs 3 hours first year; 2 hours subsequent years     Obesity and Counseling All adults Screening once a year Counseling if BMI 30 or higher  Today   Tobacco Use Counseling Adults who use tobacco  Up to 8 visits in one year X N/A   Vaccines Z23  Hepatitis B  Influenza    Pneumonia  Adults   Once  Once every flu season  Two different vaccines separated by one year X N/A   Next Annual Wellness Visit People with Medicare Every year  Today     Services & Screenings Women Who How Often Need  Date of Last Service Action  Mammogram  Z12.31 Women over 56 One baseline ages 77-39. Annually ager 40 yrs+ X 08/01/2018   Pap tests All women Annually if high risk. Every 2 yrs for normal risk women     Screening for cervical cancer with   Pap (Z01.419 nl or Z01.411abnl) &  HPV Z11.51 Women aged 63 to 18 Once every 5 yrs     Screening pelvic and breast exams All women Annually if high risk. Every 2 yrs for normal risk women     Sexually Transmitted Diseases  Chlamydia  Gonorrhea  Syphilis All at risk adults Annually for non pregnant females at increased risk         [place] Men Who How Ofter Need  Date of Last Service Action  Prostate Cancer - DRE & PSA Men over 50 Annually.  DRE might require a copay.     Sexually Transmitted Diseases  Syphilis All at risk adults Annually  for men at increased risk         Things That May Be Affecting Your Health:  Alcohol  Hearing loss X Pain    Depression  Home Safety  Sexual Health  X Diabetes  Lack of physical activity  Stress   Difficulty with daily activities  Loneliness  Tiredness   Drug use  Medicines X Tobacco use   Falls  Motor Vehicle Safety  Weight   Food choices  Oral Health  Other    YOUR PERSONALIZED HEALTH PLAN : 1. Schedule your next subsequent Medicare Wellness visit in one year 2. Attend all of your regular appointments to address your medical issues 3. Complete the preventative screenings and services 4.

## 2020-08-16 ENCOUNTER — Encounter: Payer: Self-pay | Admitting: Orthopedic Surgery

## 2020-08-16 NOTE — Progress Notes (Signed)
Office Visit Note   Patient: Susan Holmes           Date of Birth: [birth year]           MRN: [id number] Visit Date: 08/14/2020 Requested by: [name], MD [address] [place],  [place] [address] PCP: [name], MD  Subjective: Chief Complaint  Patient presents with  . Left Leg - Pain    HPI: Roby is a patient with continued left leg pain.  Pain is worse at night.  Taking oxycodone 10 4 times a day.  She describes low back pain level 6 out of 10.  The pain radiates primarily below the knee.  Describes left leg swelling.  Recurring theme of her clinic visits is that she was fine before her arterial bypass surgery which was required to save her leg from arterial insufficiency.  She states that after the surgery has been her leg started hurting.  Knee MRI scan unremarkable for any medial sided pain.              ROS: All systems reviewed are negative as they relate to the chief complaint within the history of present illness.  Patient denies  fevers or chills.   Assessment & Plan: Visit Diagnoses:  1. Pain in left leg     Plan: Impression is left leg pain.  Could be referred pain from the back.  DVT less likely.  Plain radiographs of the back unremarkable today.  Plan is ultrasound to rule out DVT left lower extremity.  MRI lumbar spine to evaluate source of possible left-sided radiculopathy.  This does not look like referred pain from the hip.  If her MRI scan of the lumbar spine shows no left-sided symptoms this is a pain she is going to have to live with.  Saphenous nerve irritation is a possibility but not really cooperated on exam.  In general she has great pedal pulses.  Follow-up after that MRI scan.  Follow-Up Instructions: Return for after MRI.   Orders:  Orders Placed This Encounter  Procedures  . XR Lumbar Spine 2-3 Views  . MR Lumbar Spine w/o contrast  . VAS [place] LOWER EXTREMITY VENOUS (DVT)   No orders of the defined types were placed in this  encounter.     Procedures: No procedures performed   Clinical Data: No additional findings.  Objective: Vital Signs: There were no vitals taken for this visit.  Physical Exam:   Constitutional: Patient appears well-developed HEENT:  Head: Normocephalic Eyes:EOM are normal Neck: Normal range of motion Cardiovascular: Normal rate Pulmonary/chest: Effort normal Neurologic: Patient is alert Skin: Skin is warm Psychiatric: Patient has normal mood and affect    Ortho Exam: Ortho exam demonstrates full active and passive range of motion of the left knee.  No effusion.  Mild pitting edema bilateral lower extremities.  Pedal pulses palpable bilaterally.  Negative Homans no calf tenderness but she does have a little bit of swelling in both legs worse on the left-hand side.  No groin pain with internal extra rotation of the leg.  No nerve root tension signs.  Good ankle dorsiflexion plantarflexion quad hamstring strength.  Specialty Comments:  No specialty comments available.  Imaging: No results found.   PMFS History: Patient Active Problem List   Diagnosis Date Noted  . Neck pain on left side 12/17/2019  . PAD (peripheral artery disease) ([place]) 10/22/2019  . Sinus congestion 10/11/2019  . PVD (peripheral vascular disease) ([place]) 10/04/2019  .  Insulin dependent type 2 diabetes mellitus ([place]) 10/04/2019  . Claudication ([place]) 09/14/2019  . Left foot pain 07/12/2019  . Paronychia of great toe, left 06/21/2019  . Orthostatic hypotension 02/15/2019  . Diaphoresis 02/15/2019  . Nephrolithiasis 10/03/2018  . Left chest pressure 09/26/2018  . Upper back pain on right side 09/19/2018  . Callus of foot 09/19/2018  . Reactive airway disease 06/20/2018  . Unintentional weight loss 06/14/2018  . History of vitamin D deficiency 09/06/2017  . Forgetfulness 06/07/2017  . Dyspepsia 04/26/2017  . History of CVA (cerebrovascular accident) 07/23/2016  . Cough 07/08/2016  . Lower back  pain 08/06/2015  . At risk for polypharmacy 08/06/2015  . Osteoarthritis 12/11/2014  . History of chest pain 10/07/2014  . Pre-operative cardiovascular examination 10/07/2014  . Diarrhea 04/04/2014  . Liver hemangioma 04/04/2014  . RBC microcytosis 02/05/2014  . Hyperparathyroidism ([place]) 03/08/2013  . Calcific tendinitis of right shoulder 02/26/2012  . Fatigue 12/14/2011  . Uncontrolled type II diabetes mellitus ([place]) 06/12/2007  . Hyperlipidemia associated with type 2 diabetes mellitus ([place]) 06/12/2007  . ANXIETY 06/12/2007  . TOBACCO ABUSE 06/12/2007  . Depression 06/12/2007  . CONGENITAL BLINDNESS, LEFT EYE 06/12/2007  . Essential hypertension 06/12/2007   Past Medical History:  Diagnosis Date  . Anxiety   . Arthritis   . Chest pain 09/26/2018  . Congenital blindness    Left  eye  . Coronary artery disease    per cardiac cath 2015-- mild disease involving LAD and branches ,  LCFx and branches, and mild disease pRCA  . Depression   . Diabetes mellitus, type II ([place])    followed by pcp  . Glaucoma   . Glaucoma, both eyes   . Hepatitis B infection pt unsure   resolved  . History of kidney stones    right stone  . History of transient ischemic attack (TIA) 2017   per pt no residuals  . HTN (hypertension)    followed by pcp  . Hyperlipidemia   . PAD (peripheral artery disease) ([place])   . Poor dental hygiene   . Stroke ([place])   . Tobacco abuse    .5 PPD smoker    Family History  Problem Relation Age of Onset  . Heart disease Mother 63  . Hypertension Mother   . CVA Mother   . Heart disease Father 19  . CVA Sister   . CVA Brother   . Aneurysm Brother   . Aneurysm Sister   . CVA Maternal Grandmother   . CVA Maternal Grandfather   . Colon cancer Neg Hx   . Esophageal cancer Neg Hx   . Stomach cancer Neg Hx     Past Surgical History:  Procedure Laterality Date  . ABDOMINAL AORTOGRAM W/LOWER EXTREMITY N/A 09/14/2019   Procedure: ABDOMINAL AORTOGRAM W/LOWER  EXTREMITY;  Surgeon: Elam Dutch, MD;  Location: [facility];  Service: Cardiovascular;  Laterality: N/A;  . ABDOMINAL HYSTERECTOMY  1980s   unsure if ovaries were taken  . BYPASS GRAFT Left 10/22/2019    Left femoral to above-knee popliteal bypass using reversed ipsilateral greater saphenous vein  . colonoscopy    . FEMORAL-POPLITEAL BYPASS GRAFT Left 10/22/2019   Procedure: LEFT FEMORAL-ABOVE KNEE POPLITEAL  BYPASS GRAFT with REVERSED GREATER SAPHENOUS VEIN;  Surgeon: Elam Dutch, MD;  Location: [facility];  Service: Vascular;  Laterality: Left;  . GLAUCOMA SURGERY Right 2017  . IR URETERAL STENT RIGHT NEW ACCESS W/O SEP NEPHROSTOMY CATH  10/03/2018  .  LEFT HEART CATHETERIZATION WITH CORONARY ANGIOGRAM N/A 10/09/2014   Procedure: LEFT HEART CATHETERIZATION WITH CORONARY ANGIOGRAM;  Surgeon: Troy Sine, MD;  [facility] CATH LAB;  Service: Cardiovascular;  Laterality: N/A;  . NEPHROLITHOTOMY Right 10/03/2018   Procedure: RIGHT NEPHROLITHOTOMY PERCUTANEOUS FIRST STAGE;  Surgeon: Irine Seal, MD;  Location: [facility] ORS;  Service: Urology;  Laterality: Right;  . PERIPHERAL VASCULAR INTERVENTION Left 09/14/2019   Procedure: PERIPHERAL VASCULAR INTERVENTION;  Surgeon: Elam Dutch, MD;  Location: [facility];  Service: Cardiovascular;  Laterality: Left;   left external iliac stent   . TEE WITHOUT CARDIOVERSION N/A 08/17/2016   Procedure: TRANSESOPHAGEAL ECHOCARDIOGRAM (TEE);  Surgeon: Sueanne Margarita, MD;  Location: [facility] ENDOSCOPY;  Service: Cardiovascular;  Laterality: N/A;   Social History   Occupational History    Employer: UNEMPLOYED  Tobacco Use  . Smoking status: Current Every Day Smoker    Packs/day: 0.50    Years: 39.00    Pack years: 19.50    Types: Cigarettes    Start date: 19  . Smokeless tobacco: Current User    Types: Chew  . Tobacco comment: half pack per day per patient report 10/25/2019  Vaping Use  . Vaping Use: Never used  Substance and Sexual  Activity  . Alcohol use: No    Alcohol/week: 0.0 standard drinks  . Drug use: No  . Sexual activity: Not Currently    Birth control/protection: Surgical

## 2020-08-18 ENCOUNTER — Encounter: Payer: Self-pay | Admitting: Internal Medicine

## 2020-08-18 ENCOUNTER — Telehealth: Payer: Self-pay | Admitting: Dietician

## 2020-08-18 ENCOUNTER — Other Ambulatory Visit: Payer: Self-pay

## 2020-08-18 ENCOUNTER — Ambulatory Visit (INDEPENDENT_AMBULATORY_CARE_PROVIDER_SITE_OTHER): Payer: Medicare Other | Admitting: Internal Medicine

## 2020-08-18 VITALS — BP 171/95 | HR 63 | Ht 67.0 in | Wt 179.0 lb

## 2020-08-18 DIAGNOSIS — Z0000 Encounter for general adult medical examination without abnormal findings: Secondary | ICD-10-CM

## 2020-08-18 DIAGNOSIS — E1165 Type 2 diabetes mellitus with hyperglycemia: Secondary | ICD-10-CM

## 2020-08-18 DIAGNOSIS — Z23 Encounter for immunization: Secondary | ICD-10-CM

## 2020-08-18 DIAGNOSIS — E785 Hyperlipidemia, unspecified: Secondary | ICD-10-CM

## 2020-08-18 DIAGNOSIS — E1169 Type 2 diabetes mellitus with other specified complication: Secondary | ICD-10-CM

## 2020-08-18 DIAGNOSIS — Z1231 Encounter for screening mammogram for malignant neoplasm of breast: Secondary | ICD-10-CM

## 2020-08-18 LAB — POCT GLYCOSYLATED HEMOGLOBIN (HGB A1C): Hemoglobin A1C: 6.7 % — AB (ref 4.0–5.6)

## 2020-08-18 LAB — GLUCOSE, CAPILLARY: Glucose-Capillary: 144 mg/dL — ABNORMAL HIGH (ref 70–99)

## 2020-08-18 MED ORDER — ACCU-CHEK GUIDE VI STRP: ORAL_STRIP | 12 refills | Status: DC

## 2020-08-18 MED ORDER — ACCU-CHEK GUIDE ME W/DEVICE KIT: PACK | 0 refills | Status: DC

## 2020-08-18 MED ORDER — ACCU-CHEK SOFT TOUCH LANCETS MISC: 12 refills | Status: DC

## 2020-08-18 NOTE — Progress Notes (Signed)
This AWV is being conducted in-person at [facility]  Subjective:   Susan Holmes is a 64 y.o. female who presents for a Medicare Annual Wellness Visit.  The following items have been reviewed and updated today in the appropriate area in the EMR.   Health Risk Assessment  Height, weight, BMI, and BP Visual acuity if needed Depression screen Fall risk / safety level Advance directive discussion Medical and family history were reviewed and updated Updating list of other providers & suppliers Medication reconciliation, including over the counter medicines Cognitive screen Written screening schedule Risk Factor list Personalized health advice, risky behaviors, and treatment advice  Social History   Social History Narrative   Current Social History 08/18/2020        Patient lives alone in a ground floor apartment which is 1 story. There are 13 steps with handrail up to the entrance the patient uses.       Patient's method of transportation is [facility] transportation.      The highest level of education was 2 years college.      The patient currently disabled.      Identified important Relationships are "My daughter, brothers, grands and great-grands."       Pets : chihuahua Chief Executive Officer)       Interests / Fun: "I don't"       Current Stressors: "Nothing"       Religious / Personal Beliefs: 'Baptist"       Other: "I am a very loving person"      L. Cory Kitt, BSN, RN-BC              Objective:    Vitals: BP (!) 171/95 (BP Location: Left Arm, Cuff Size: Normal)   Pulse 63   Ht 5\' 7"  (1.702 m)   Wt 179 lb (81.2 kg)   SpO2 98%   BMI 28.04 kg/m  Vitals are [facility] [place] performed  Activities of Daily Living In your present state of health, do you have any difficulty performing the following activities: 02/06/2020 12/07/2019  Hearing? N N  Vision? Y Y  Comment - -  Difficulty concentrating or making decisions? N N  Walking or climbing stairs? Y N  Dressing or bathing? N N  Doing  errands, shopping? N [place] and eating ? - -  Using the Toilet? - -  In the past six months, have you accidently leaked urine? - -  Do you have problems with loss of bowel control? - -  Managing your Medications? - -  Managing your Finances? - -  Housekeeping or managing your Housekeeping? - -  Some recent data might be hidden    Goals Goals    .  "My blood pressure is better but I'm having knee problems." (pt-stated)      CARE PLAN ENTRY (see longitudinal plan of care for additional care plan information)   Current Barriers:  Marland Kitchen Knowledge Deficits related to basic understanding of hypertension pathophysiology and self care management . Knowledge Deficits related to understanding of medications prescribed for management of hypertension- patient says she is still bothered by left knee and leg pain that has existed since her left fem-pop bypass surgery in October of 2020, says she saw Dr. Marlou Sa (ortho) today and he took X-rays of her back and referred her for a doppler of her lower extremities tomorrow to check for DVT, he told her at her June visit the MRI of her knee was  normal, she is requesting OP PT as steroid injections did not help , she says she is not self monitoring her blood pressure at present but that her blood pressure at Dr Randel Pigg office today was "OK", she reports daily blood readings are meeting target, denies hypoglycemia, reports good medication taking behavior, denies symptoms of orthostatic hypotension.  Case Manager Clinical Goal(s):  Marland Kitchen Over the next 30- 60 days, patient will verbalize understanding of plan for hypertension management . Over the next 30 - 60 days, patient will attend all scheduled medical appointments:  . Over the next 30 -60 days, patient will demonstrate improved adherence to prescribed treatment plan for hypertension as evidenced by taking all medications as prescribed, monitoring and recording blood pressure as directed,  adhering to low sodium/DASH diet . Over the next 30 - 60 days, patient will demonstrate improved health management independence as evidenced by checking blood pressure as directed and notifying PCP if SBP>200 or DBP > 100, taking all medications as prescribe, and adhering to a low sodium diet as discussed.  Interventions:  . Appropriate assessments completed . Assessed home BP readings . Assessed home blood sugar readings  . Assessed medication taking behavior . Assessed c/o left knee and leg pain . Again reminded patient she is overdue for annual diabetic eye exam . Requested she schedule clinic appointment to complete her heath maintenance  visit  . Explained indication for dopplers of LE tomorrow and directed her on how to find the vascular lab at [facility] . Reviewed upcoming appointment with podiatrist on 09/26/20   Patient Self Care Activities:  . Able to independently manage blood pressure to meet target of <140/<90 and to manage blood sugar to meet Hgb A1C target . Self administers medications as prescribed . Attends all scheduled provider appointments . Calls provider office for new concerns, questions, or BP outside discussed parameters or seeks emergency assistance for symptoms of stroke . Checks BP and record as discussed . Checks blood sugar at least daily and calls provider for readings outside established parameters . Follows a low sodium diet/DASH diet . Eliminate energy drinks  Please see past updates related to this goal by clicking on the "Past Updates" button in the selected goal       .  Blood Pressure < 140/90      BP Readings from Last 3 Encounters:  03/06/20 (!) 175/101  02/06/20 (!) 141/64  12/18/19 137/86    Not meeting blood pressure targets    .  Have 3 meals a day    .  HEMOGLOBIN A1C < 7.0      Lab Results  Component Value Date   HGBA1C 6.5 (A) 02/06/2020    Meeting Hgb A1C  target    .  LDL CALC < 100    .  Quit smoking / using tobacco       Information provided on [place] Quitline and smoking cessation classes at [facility]   Fall Risk Fall Risk  08/18/2020 04/03/2020 02/06/2020 12/07/2019 11/12/2019  Falls in the past year? 0 0 0 0 (No Data)  Comment - - - - Falls screening previously completed- patient denies new/ recent falls  Number falls in past yr: - - - - -  Injury with Fall? - - - - -  Comment - - - - -  Risk Factor Category  - - - - -  Risk for fall due to : Impaired balance/gait;Impaired mobility Impaired balance/gait;Other (Comment) - - -  Risk for fall  due to: Comment left knee pain weak and dizzy - - -  Follow up Falls prevention discussed;Education provided Falls prevention discussed Falls evaluation completed - Falls prevention discussed   CDC Handout on Fall Prevention and Handout on Home Exercise Program, Access codes [email or web address] and [email or web address] given to patient with exercise band.   Depression Screen PHQ 2/9 Scores 08/18/2020 04/03/2020 02/06/2020 12/07/2019  PHQ - 2 Score 2 4 0 0  PHQ- 9 Score 6 12 - 9  Exception Documentation - - - -  Some recent data might be hidden     Cognitive Testing Six-Item Cognitive Screener   "I would like to ask you some questions that ask you to use your memory. I am going to name three objects. Please wait until I say all three words, then repeat them. Remember what they are  because I am going to ask you to name them again in a few minutes. Please repeat these words for me: APPLE--TABLE--PENNY." (Interviewer may repeat names 3 times if necessary but repetition not scored.)  Did patient correctly repeat all three words? Yes - may proceed with screen  What year is this? Correct What month is this? Correct What day of the week is this? Correct  What were the three objects I asked you to remember? . Apple Correct . Table Correct . Kieth Brightly Unable to state  Score one point for each incorrect answer.  A score of 2 or more points warrants additional investigation.  Patient's score  1    Assessment and Plan:     Patient has received both Covid vaccines but did not have card with her today to document in Epic. A referral has been placed to [facility] for screening mammogram Information provided on [facility] [place] Quitline and smoking cessation classes at [facility] Patient will call Dr. Zenia Resides office to schedule yearly eye exam Patient will begin seated and standing exercises with exercise band to increase strength and balance. F/u appt scheduled with PCP for 09/29/2020  During the course of the visit the patient was educated and counseled about appropriate screening and preventive services as documented in the assessment and plan.  The printed AVS was given to the patient and included an updated screening schedule, a list of risk factors, and personalized health advice.        Velora Heckler, RN  08/18/2020

## 2020-08-18 NOTE — Telephone Encounter (Signed)
"  I need anew meter" Ms Inocencio says she replaced her battery but is still having problems with her glucometer. It is an older One Touch meter.

## 2020-08-18 NOTE — Patient Instructions (Addendum)
Annual Wellness Visit                       Medicare Covered Preventative Screenings and Services  Services & Screenings Men and Women Who How Often Need? Date of Last Service Action  Abdominal Aortic Aneurysm Adults with AAA risk factors Once     Alcohol Misuse and Counseling All Adults Screening once a year if no alcohol misuse. Counseling up to 4 face to face sessions.     Bone Density Measurement  Adults at risk for osteoporosis Once every 2 yrs     Lipid Panel Z13.6 All adults without CV disease Once every 5 yrs X 09/26/2018 Done today  Colorectal Cancer   Stool sample or  Colonoscopy All adults 40 and older   Once every year  Every 10 years     Depression All Adults Once a year  Today   Diabetes Screening Blood glucose, post glucose load, or GTT Z13.1  All adults at risk  Pre-diabetics  Once per year  Twice per year X 02/06/2020 = 6.5 Done today  Diabetes  Self-Management Training All adults Diabetics 10 hrs first year; 2 hours subsequent years. Requires Copay     Glaucoma  Diabetics  Family history of glaucoma  African Americans 67 yrs +  Hispanic Americans 17 yrs + Annually - requires coppay     Hepatitis C Z72.89 or F19.20  High Risk for HCV  Born between 1945 and 1965  Annually  Once     HIV Z11.4 All adults based on risk  Annually btw ages 42 & 38 regardless of risk  Annually > 65 yrs if at increased risk     Lung Cancer Screening Asymptomatic adults aged 54-77 with 30 pack yr history and current smoker OR quit within the last 15 yrs Annually Must have counseling and shared decision making documentation before first screen     Medical Nutrition Therapy Adults with   Diabetes  Renal disease  Kidney transplant within past 3 yrs 3 hours first year; 2 hours subsequent years     Obesity and Counseling All adults Screening once a year Counseling if BMI 30 or higher  Today   Tobacco Use Counseling Adults who use  tobacco  Up to 8 visits in one year X N/A 1-800-QUIT-NOW  Vaccines Z23  Hepatitis B  Influenza   Pneumonia  Adults   Once  Once every flu season  Two different vaccines separated by one year X N/A Yearly Flu vaccine starting September 1  You received your [facility]an today  Next Annual Wellness Visit People with Medicare Every year  Today     Services & Screenings Women Who How Often Need  Date of Last Service Action  Mammogram  Z12.31 Women over 15 One baseline ages 34-39. Annually ager 46 yrs+ X 08/01/2018 Ordered today  Pap tests All women Annually if high risk. Every 2 yrs for normal risk women     Screening for cervical cancer with   Pap (Z01.419 nl or Z01.411abnl) &  HPV Z11.51 Women aged 25 to 74 Once every 5 yrs     Screening pelvic and breast exams All women Annually if high risk. Every 2 yrs for normal risk women     Sexually Transmitted Diseases  Chlamydia  Gonorrhea  Syphilis All at risk adults Annually for non pregnant females at increased risk         [place] Men Who How Ofter Need  Date of Last Service Action  Prostate Cancer - DRE & PSA Men over 50 Annually.  DRE might require a copay.     Sexually Transmitted Diseases  Syphilis All at risk adults Annually for men at increased risk         Things That May Be Affecting Your Health:  Alcohol  Hearing loss X Pain    Depression  Home Safety  Sexual Health  X Diabetes  Lack of physical activity  Stress   Difficulty with daily activities  Loneliness  Tiredness   Drug use  Medicines X Tobacco use   Falls  Motor Vehicle Safety  Weight   Food choices  Oral Health  Other    YOUR PERSONALIZED HEALTH PLAN : 1. Schedule your next subsequent Medicare Wellness visit in one year 2. Attend all of your regular appointments to address your medical issues 3. Complete the preventative screenings and services 4. Begin seated and standing  exercises with exercise band to increase strength and balance. 5. A referral has been placed to [facility] for mammogram 6. Call 1-800-QUIT-NOW when you are ready to quit smoking 7. Call Dr. Zenia Resides office to schedule yearly eye exam    Diabetes Mellitus and [place] care is an important part of your health, especially when you have diabetes. Diabetes may cause you to have problems because of poor blood flow (circulation) to your feet and legs, which can cause your skin to:  Become thinner and drier.  Break more easily.  Heal more slowly.  Peel and crack. You may also have nerve damage (neuropathy) in your legs and feet, causing decreased feeling in them. This means that you may not notice minor injuries to your feet that could lead to more serious problems. Noticing and addressing any potential problems early is the best way to prevent future foot problems. How to care for your feet Foot hygiene  Wash your feet daily with warm water and mild soap. Do not use hot water. Then, pat your feet and the areas between your toes until they are completely dry. Do not soak your feet as this can dry your skin.  Trim your toenails straight across. Do not dig under them or around the cuticle. File the edges of your nails with an emery board or nail file.  Apply a moisturizing lotion or petroleum jelly to the skin on your feet and to dry, brittle toenails. Use lotion that does not contain alcohol and is unscented. Do not apply lotion between your toes. Shoes and socks  Wear clean socks or stockings every day. Make sure they are not too tight. Do not wear knee-high stockings since they may decrease blood flow to your legs.  Wear shoes that fit properly and have enough cushioning. Always look in your shoes before you put them on to be sure there are no objects inside.  To break in new shoes, wear them for just a few hours a day. This prevents injuries on your feet. Wounds, scrapes,  corns, and calluses  Check your feet daily for blisters, cuts, bruises, sores, and redness. If you cannot see the bottom of your feet, use a mirror or ask someone for help.  Do not cut corns or calluses or try to remove them with medicine.  If you find a minor scrape, cut, or break in the skin on your feet, keep it and the skin around it clean and dry. You may clean these areas with mild soap and water.  Do not clean the area with peroxide, alcohol, or iodine.  If you have a wound, scrape, corn, or callus on your foot, look at it several times a day to make sure it is healing and not infected. Check for: ? Redness, swelling, or pain. ? Fluid or blood. ? Warmth. ? Pus or a bad smell. General instructions  Do not cross your legs. This may decrease blood flow to your feet.  Do not use heating pads or hot water bottles on your feet. They may burn your skin. If you have lost feeling in your feet or legs, you may not know this is happening until it is too late.  Protect your feet from hot and cold by wearing shoes, such as at the beach or on hot pavement.  Schedule a complete foot exam at least once a year (annually) or more often if you have foot problems. If you have foot problems, report any cuts, sores, or bruises to your health care provider immediately. Contact a health care provider if:  You have a medical condition that increases your risk of infection and you have any cuts, sores, or bruises on your feet.  You have an injury that is not healing.  You have redness on your legs or feet.  You feel burning or tingling in your legs or feet.  You have pain or cramps in your legs and feet.  Your legs or feet are numb.  Your feet always feel cold.  You have pain around a toenail. Get help right away if:  You have a wound, scrape, corn, or callus on your foot and: ? You have pain, swelling, or redness that gets worse. ? You have fluid or blood coming from the wound, scrape, corn,  or callus. ? Your wound, scrape, corn, or callus feels warm to the touch. ? You have pus or a bad smell coming from the wound, scrape, corn, or callus. ? You have a fever. ? You have a red line going up your leg. Summary  Check your feet every day for cuts, sores, red spots, swelling, and blisters.  Moisturize feet and legs daily.  Wear shoes that fit properly and have enough cushioning.  If you have foot problems, report any cuts, sores, or bruises to your health care provider immediately.  Schedule a complete foot exam at least once a year (annually) or more often if you have foot problems. This information is not intended to replace advice given to you by your health care provider. Make sure you discuss any questions you have with your health care provider. Document Revised: 09/05/2019 Document Reviewed: 01/14/2017 Elsevier Patient Education  [place] Prevention in the Home, Adult Falls can cause injuries. They can happen to people of all ages. There are many things you can do to make your home safe and to help prevent falls. Ask for help when making these changes, if needed. What actions can I take to prevent falls? General Instructions  Use good lighting in all rooms. Replace any light bulbs that burn out.  Turn on the lights when you go into a dark area. Use night-lights.  Keep items that you use often in easy-to-reach places. Lower the shelves around your home if necessary.  Set up your furniture so you have a clear path. Avoid moving your furniture around.  Do not have throw rugs and other things on the floor that can make you trip.  Avoid walking on wet floors.  If  any of your floors are uneven, fix them.  Add color or contrast paint or tape to clearly mark and help you see: ? Any grab bars or handrails. ? First and last steps of stairways. ? Where the edge of each step is.  If you use a stepladder: ? Make sure that it is fully opened. Do not  climb a closed stepladder. ? Make sure that both sides of the stepladder are locked into place. ? Ask someone to hold the stepladder for you while you use it.  If there are any pets around you, be aware of where they are. What can I do in the bathroom?      Keep the floor dry. Clean up any water that spills onto the floor as soon as it happens.  Remove soap buildup in the tub or shower regularly.  Use non-skid mats or decals on the floor of the tub or shower.  Attach bath mats securely with double-sided, non-slip rug tape.  If you need to sit down in the shower, use a plastic, non-slip stool.  Install grab bars by the toilet and in the tub and shower. Do not use towel bars as grab bars. What can I do in the bedroom?  Make sure that you have a light by your bed that is easy to reach.  Do not use any sheets or blankets that are too big for your bed. They should not hang down onto the floor.  Have a firm chair that has side arms. You can use this for support while you get dressed. What can I do in the kitchen?  Clean up any spills right away.  If you need to reach something above you, use a strong step stool that has a grab bar.  Keep electrical cords out of the way.  Do not use floor polish or wax that makes floors slippery. If you must use wax, use non-skid floor wax. What can I do with my stairs?  Do not leave any items on the stairs.  Make sure that you have a light switch at the top of the stairs and the bottom of the stairs. If you do not have them, ask someone to add them for you.  Make sure that there are handrails on both sides of the stairs, and use them. Fix handrails that are broken or loose. Make sure that handrails are as long as the stairways.  Install non-slip stair treads on all stairs in your home.  Avoid having throw rugs at the top or bottom of the stairs. If you do have throw rugs, attach them to the floor with carpet tape.  Choose a carpet that does  not hide the edge of the steps on the stairway.  Check any carpeting to make sure that it is firmly attached to the stairs. Fix any carpet that is loose or worn. What can I do on the outside of my home?  Use bright outdoor lighting.  Regularly fix the edges of walkways and driveways and fix any cracks.  Remove anything that might make you trip as you walk through a door, such as a raised step or threshold.  Trim any bushes or trees on the path to your home.  Regularly check to see if handrails are loose or broken. Make sure that both sides of any steps have handrails.  Install guardrails along the edges of any raised decks and porches.  Clear walking paths of anything that might make someone trip, such as  tools or rocks.  Have any leaves, snow, or ice cleared regularly.  Use sand or salt on walking paths during winter.  Clean up any spills in your garage right away. This includes grease or oil spills. What other actions can I take?  Wear shoes that: ? Have a low heel. Do not wear high heels. ? Have rubber bottoms. ? Are comfortable and fit you well. ? Are closed at the toe. Do not wear open-toe sandals.  Use tools that help you move around (mobility aids) if they are needed. These include: ? Canes. ? Walkers. ? Scooters. ? Crutches.  Review your medicines with your doctor. Some medicines can make you feel dizzy. This can increase your chance of falling. Ask your doctor what other things you can do to help prevent falls. Where to find more information  Centers for Disease Control and Prevention, [employer]: https://garcia.biz/  [employer] on Aging: BrainJudge.co.uk Contact a doctor if:  You are afraid of falling at home.  You feel weak, drowsy, or dizzy at home.  You fall at home. Summary  There are many simple things that you can do to make your home safe and to help prevent falls.  Ways to make your home safe include removing tripping hazards and  installing grab bars in the bathroom.  Ask for help when making these changes in your home. This information is not intended to replace advice given to you by your health care provider. Make sure you discuss any questions you have with your health care provider. Document Revised: 04/05/2019 Document Reviewed: 07/28/2017 Elsevier Patient Education  2020 [place] Maintenance, Female Adopting a healthy lifestyle and getting preventive care are important in promoting health and wellness. Ask your health care provider about:  The right schedule for you to have regular tests and exams.  Things you can do on your own to prevent diseases and keep yourself healthy. What should I know about diet, weight, and exercise? Eat a healthy diet   Eat a diet that includes plenty of vegetables, fruits, low-fat dairy products, and lean protein.  Do not eat a lot of foods that are high in solid fats, added sugars, or sodium. Maintain a healthy weight Body mass index (BMI) is used to identify weight problems. It estimates body fat based on height and weight. Your health care provider can help determine your BMI and help you achieve or maintain a healthy weight. Get regular exercise Get regular exercise. This is one of the most important things you can do for your health. Most adults should:  Exercise for at least 150 minutes each week. The exercise should increase your heart rate and make you sweat (moderate-intensity exercise).  Do strengthening exercises at least twice a week. This is in addition to the moderate-intensity exercise.  Spend less time sitting. Even light physical activity can be beneficial. Watch cholesterol and blood lipids Have your blood tested for lipids and cholesterol at 65 years of age, then have this test every 5 years. Have your cholesterol levels checked more often if:  Your lipid or cholesterol levels are high.  You are older than 65 years of age.  You are at  high risk for heart disease. What should I know about cancer screening? Depending on your health history and family history, you may need to have cancer screening at various ages. This may include screening for:  Breast cancer.  Cervical cancer.  Colorectal cancer.  Skin cancer.  Lung cancer. What should  I know about heart disease, diabetes, and high blood pressure? Blood pressure and heart disease  High blood pressure causes heart disease and increases the risk of stroke. This is more likely to develop in people who have high blood pressure readings, are of African descent, or are overweight.  Have your blood pressure checked: ? Every 3-5 years if you are 22-33 years of age. ? Every year if you are 76 years old or older. Diabetes Have regular diabetes screenings. This checks your fasting blood sugar level. Have the screening done:  Once every three years after age 51 if you are at a normal weight and have a low risk for diabetes.  More often and at a younger age if you are overweight or have a high risk for diabetes. What should I know about preventing infection? Hepatitis B If you have a higher risk for hepatitis B, you should be screened for this virus. Talk with your health care provider to find out if you are at risk for hepatitis B infection. Hepatitis C Testing is recommended for:  Everyone born from [birth year] through 1966.  Anyone with known risk factors for hepatitis C. Sexually transmitted infections (STIs)  Get screened for STIs, including gonorrhea and chlamydia, if: ? You are sexually active and are younger than 65 years of age. ? You are older than 65 years of age and your health care provider tells you that you are at risk for this type of infection. ? Your sexual activity has changed since you were last screened, and you are at increased risk for chlamydia or gonorrhea. Ask your health care provider if you are at risk.  Ask your health care provider about whether  you are at high risk for HIV. Your health care provider may recommend a prescription medicine to help prevent HIV infection. If you choose to take medicine to prevent HIV, you should first get tested for HIV. You should then be tested every 3 months for as long as you are taking the medicine. Pregnancy  If you are about to stop having your period (premenopausal) and you may become pregnant, seek counseling before you get pregnant.  Take 400 to 800 micrograms (mcg) of folic acid every day if you become pregnant.  Ask for birth control (contraception) if you want to prevent pregnancy. Osteoporosis and menopause Osteoporosis is a disease in which the bones lose minerals and strength with aging. This can result in bone fractures. If you are 25 years old or older, or if you are at risk for osteoporosis and fractures, ask your health care provider if you should:  Be screened for bone loss.  Take a calcium or vitamin D supplement to lower your risk of fractures.  Be given hormone replacement therapy (HRT) to treat symptoms of menopause. Follow these instructions at home: Lifestyle  Do not use any products that contain nicotine or tobacco, such as cigarettes, e-cigarettes, and chewing tobacco. If you need help quitting, ask your health care provider.  Do not use street drugs.  Do not share needles.  Ask your health care provider for help if you need support or information about quitting drugs. Alcohol use  Do not drink alcohol if: ? Your health care provider tells you not to drink. ? You are pregnant, may be pregnant, or are planning to become pregnant.  If you drink alcohol: ? Limit how much you use to 0-1 drink a day. ? Limit intake if you are breastfeeding.  Be aware of how much  alcohol is in your drink. In the [place] one drink equals one 12 oz bottle of beer (355 mL), one 5 oz glass of wine (148 mL), or one 1 oz glass of hard liquor (44 mL). General instructions  Schedule regular  health, dental, and eye exams.  Stay current with your vaccines.  Tell your health care provider if: ? You often feel depressed. ? You have ever been abused or do not feel safe at home. Summary  Adopting a healthy lifestyle and getting preventive care are important in promoting health and wellness.  Follow your health care provider's instructions about healthy diet, exercising, and getting tested or screened for diseases.  Follow your health care provider's instructions on monitoring your cholesterol and blood pressure. This information is not intended to replace advice given to you by your health care provider. Make sure you discuss any questions you have with your health care provider. Document Revised: 12/06/2018 Document Reviewed: 12/06/2018 Elsevier Patient Education  2020 [employer].   Steps to Quit Smoking Smoking tobacco is the leading cause of preventable death. It can affect almost every organ in the body. Smoking puts you and people around you at risk for many serious, long-lasting (chronic) diseases. Quitting smoking can be hard, but it is one of the best things that you can do for your health. It is never too late to quit. How do I get ready to quit? When you decide to quit smoking, make a plan to help you succeed. Before you quit:  Pick a date to quit. Set a date within the next 2 weeks to give you time to prepare.  Write down the reasons why you are quitting. Keep this list in places where you will see it often.  Tell your family, friends, and co-workers that you are quitting. Their support is important.  Talk with your doctor about the choices that may help you quit.  Find out if your health insurance will pay for these treatments.  Know the people, places, things, and activities that make you want to smoke (triggers). Avoid them. What first steps can I take to quit smoking?  Throw away all cigarettes at home, at work, and in your car.  Throw away the things  that you use when you smoke, such as ashtrays and lighters.  Clean your car. Make sure to empty the ashtray.  Clean your home, including curtains and carpets. What can I do to help me quit smoking? Talk with your doctor about taking medicines and seeing a counselor at the same time. You are more likely to succeed when you do both.  If you are pregnant or breastfeeding, talk with your doctor about counseling or other ways to quit smoking. Do not take medicine to help you quit smoking unless your doctor tells you to do so. To quit smoking: Quit right away  Quit smoking totally, instead of slowly cutting back on how much you smoke over a period of time.  Go to counseling. You are more likely to quit if you go to counseling sessions regularly. Take medicine You may take medicines to help you quit. Some medicines need a prescription, and some you can buy over-the-counter. Some medicines may contain a drug called nicotine to replace the nicotine in cigarettes. Medicines may:  Help you to stop having the desire to smoke (cravings).  Help to stop the problems that come when you stop smoking (withdrawal symptoms). Your doctor may ask you to use:  Nicotine patches, gum, or lozenges.  Nicotine inhalers or sprays.  Non-nicotine medicine that is taken by mouth. Find resources Find resources and other ways to help you quit smoking and remain smoke-free after you quit. These resources are most helpful when you use them often. They include:  Online chats with a Social worker.  Phone quitlines.  Printed Furniture conservator/restorer.  Support groups or group counseling.  Text messaging programs.  Mobile phone apps. Use apps on your mobile phone or tablet that can help you stick to your quit plan. There are many free apps for mobile phones and tablets as well as websites. Examples include Quit Guide from the [employer] and smokefree.gov  What things can I do to make it easier to quit?   Talk to your family  and friends. Ask them to support and encourage you.  Call a phone quitline (1-800-QUIT-NOW), reach out to support groups, or work with a Social worker.  Ask people who smoke to not smoke around you.  Avoid places that make you want to smoke, such as: ? Bars. ? Parties. ? Smoke-break areas at work.  Spend time with people who do not smoke.  Lower the stress in your life. Stress can make you want to smoke. Try these things to help your stress: ? Getting regular exercise. ? Doing deep-breathing exercises. ? Doing yoga. ? Meditating. ? Doing a body scan. To do this, close your eyes, focus on one area of your body at a time from head to toe. Notice which parts of your body are tense. Try to relax the muscles in those areas. How will I feel when I quit smoking? Day 1 to 3 weeks Within the first 24 hours, you may start to have some problems that come from quitting tobacco. These problems are very bad 2-3 days after you quit, but they do not often last for more than 2-3 weeks. You may get these symptoms:  Mood swings.  Feeling restless, nervous, angry, or annoyed.  Trouble concentrating.  Dizziness.  Strong desire for high-sugar foods and nicotine.  Weight gain.  Trouble pooping (constipation).  Feeling like you may vomit (nausea).  Coughing or a sore throat.  Changes in how the medicines that you take for other issues work in your body.  Depression.  Trouble sleeping (insomnia). Week 3 and afterward After the first 2-3 weeks of quitting, you may start to notice more positive results, such as:  Better sense of smell and taste.  Less coughing and sore throat.  Slower heart rate.  Lower blood pressure.  Clearer skin.  Better breathing.  Fewer sick days. Quitting smoking can be hard. Do not give up if you fail the first time. Some people need to try a few times before they succeed. Do your best to stick to your quit plan, and talk with your doctor if you have any  questions or concerns. Summary  Smoking tobacco is the leading cause of preventable death. Quitting smoking can be hard, but it is one of the best things that you can do for your health.  When you decide to quit smoking, make a plan to help you succeed.  Quit smoking right away, not slowly over a period of time.  When you start quitting, seek help from your doctor, family, or friends. This information is not intended to replace advice given to you by your health care provider. Make sure you discuss any questions you have with your health care provider. Document Revised: 09/07/2019 Document Reviewed: 03/03/2019 Elsevier Patient Education  [place].

## 2020-08-19 LAB — LIPID PANEL
Chol/HDL Ratio: 1.9 ratio (ref 0.0–4.4)
Cholesterol, Total: 147 mg/dL (ref 100–199)
HDL: 76 mg/dL (ref >39)
LDL Chol Calc (NIH): 60 mg/dL (ref 0–99)
Triglycerides: 53 mg/dL (ref 0–149)
VLDL Cholesterol Cal: 11 mg/dL (ref 5–40)

## 2020-08-19 NOTE — Progress Notes (Signed)
I discussed the AWV findings with the RN who conducted the visit. I was present in the office suite and immediately available to provide assistance and direction throughout the time the service was provided.   

## 2020-08-19 NOTE — Progress Notes (Signed)
Internal Medicine Clinic Attending  Case discussed with Dr. Charleen Kirks soon after the resident saw the patient.  We reviewed the AWV findings.  I agree with the assessment, diagnosis, and plan of care documented in the AWV note.

## 2020-09-02 ENCOUNTER — Telehealth: Payer: Self-pay | Admitting: Dietician

## 2020-09-02 NOTE — Telephone Encounter (Signed)
Notified Susan Holmes that we have paperwork for CGM order and that we'll hold onto it until she sees the doctor on 09-29-20 because the notes are required CGM paperwork

## 2020-09-03 ENCOUNTER — Other Ambulatory Visit: Payer: Self-pay | Admitting: Internal Medicine

## 2020-09-03 ENCOUNTER — Other Ambulatory Visit: Payer: Self-pay | Admitting: Surgical

## 2020-09-03 DIAGNOSIS — I10 Essential (primary) hypertension: Secondary | ICD-10-CM

## 2020-09-03 NOTE — Progress Notes (Signed)
Called patient and she made me aware that her SBP is usually in the 140s  Here are her current BP meds:   Chlorthalidone 12.5mg  daily  Losartan 100mg  daily  Hydralazine 10mg  BID  Unsure if she is taking Coreg or Amlodipine  It appears that she was instructed to discontinue Chlorthalidone during one of her previous visits. I have advised her to bring all her pill bottles during her next clinic visit.

## 2020-09-04 NOTE — Telephone Encounter (Signed)
Please advise 

## 2020-09-08 ENCOUNTER — Ambulatory Visit (INDEPENDENT_AMBULATORY_CARE_PROVIDER_SITE_OTHER): Payer: Medicare Other | Admitting: Internal Medicine

## 2020-09-08 ENCOUNTER — Telehealth: Payer: Self-pay | Admitting: *Deleted

## 2020-09-08 ENCOUNTER — Other Ambulatory Visit: Payer: Self-pay

## 2020-09-08 VITALS — BP 185/108 | HR 92 | Temp 99.2°F | Wt 167.9 lb

## 2020-09-08 DIAGNOSIS — I10 Essential (primary) hypertension: Secondary | ICD-10-CM

## 2020-09-08 DIAGNOSIS — H538 Other visual disturbances: Secondary | ICD-10-CM | POA: Diagnosis not present

## 2020-09-08 DIAGNOSIS — R519 Headache, unspecified: Secondary | ICD-10-CM | POA: Diagnosis not present

## 2020-09-08 NOTE — Telephone Encounter (Signed)
Have some questions.  Please call

## 2020-09-08 NOTE — Telephone Encounter (Signed)
Return pt's call - stated her BP is high and she has been taking her medication. Today BP 200/176; last few readings are 220/123, 160/114,201/138,162/96, 220/134. C/o headaches,fatigue. Denies cp,sob. Stated BP's have been high since last week. Just checked it - 181/128. Only available appt today is @ 1545 PM with Dr Gilford Rile - pt stated she will have her brother to bring her.

## 2020-09-08 NOTE — Patient Instructions (Signed)
Ms. Cordner,  It was a pleasure meeting you today! Today we discussed your high blood pressure. Due to the multiple blood pressure medications you are currently on, we need to reconcile your medications before treating your blood pressure. We will set up a telehealth visit tomorrow to go over all your medications, before starting you on blood pressure medications. I will talk to you tomorrow.  Sincerely,  [name], MD

## 2020-09-09 ENCOUNTER — Ambulatory Visit (INDEPENDENT_AMBULATORY_CARE_PROVIDER_SITE_OTHER): Payer: Medicare Other | Admitting: Internal Medicine

## 2020-09-09 ENCOUNTER — Encounter: Payer: Self-pay | Admitting: Internal Medicine

## 2020-09-09 ENCOUNTER — Other Ambulatory Visit: Payer: Self-pay

## 2020-09-09 DIAGNOSIS — Z79899 Other long term (current) drug therapy: Secondary | ICD-10-CM | POA: Diagnosis not present

## 2020-09-09 DIAGNOSIS — I10 Essential (primary) hypertension: Secondary | ICD-10-CM

## 2020-09-09 LAB — BMP8+ANION GAP
Anion Gap: 18 mmol/L (ref 10.0–18.0)
BUN/Creatinine Ratio: 9 — ABNORMAL LOW (ref 12–28)
BUN: 8 mg/dL (ref 8–27)
CO2: 21 mmol/L (ref 20–29)
Calcium: 10.6 mg/dL — ABNORMAL HIGH (ref 8.7–10.3)
Chloride: 106 mmol/L (ref 96–106)
Creatinine, Ser: 0.87 mg/dL (ref 0.57–1.00)
GFR calc Af Amer: 81 mL/min/{1.73_m2} (ref >59)
GFR calc non Af Amer: 70 mL/min/{1.73_m2} (ref >59)
Glucose: 110 mg/dL — ABNORMAL HIGH (ref 65–99)
Potassium: 4 mmol/L (ref 3.5–5.2)
Sodium: 145 mmol/L — ABNORMAL HIGH (ref 134–144)

## 2020-09-09 MED ORDER — AMLODIPINE BESYLATE 10 MG PO TABS: 5.0000 mg | ORAL_TABLET | Freq: Every morning | ORAL | 1 refills | Status: DC

## 2020-09-09 MED ORDER — CARVEDILOL 12.5 MG PO TABS: ORAL_TABLET | ORAL | 6 refills | Status: DC

## 2020-09-09 NOTE — Progress Notes (Signed)
CC: High Blood Pressure  HPI:  [name] is a 64 y.o. female, with a PMH below, who presents to the clinic for high blood pressure. To see the management of her acute and chronic conditions, please see the A&P note under the Encounter tab.   Past Medical History:  Diagnosis Date  . Anxiety   . Arthritis   . Chest pain 09/26/2018  . Congenital blindness    Left  eye  . Coronary artery disease    per cardiac cath 2015-- mild disease involving LAD and branches ,  LCFx and branches, and mild disease pRCA  . Depression   . Diabetes mellitus, type II ([place])    followed by pcp  . Glaucoma   . Glaucoma, both eyes   . Hepatitis B infection pt unsure   resolved  . History of kidney stones    right stone  . History of transient ischemic attack (TIA) 2017   per pt no residuals  . HTN (hypertension)    followed by pcp  . Hyperlipidemia   . PAD (peripheral artery disease) ([place])   . Poor dental hygiene   . Stroke ([place])   . Tobacco abuse    .5 PPD smoker   Review of Systems:   Review of Systems  Constitutional: Negative for chills, fever, malaise/fatigue and weight loss.  Eyes: Negative for blurred vision, double vision, photophobia and pain.  Respiratory: Negative for shortness of breath.   Gastrointestinal: Negative for abdominal pain, constipation, diarrhea, nausea and vomiting.  Musculoskeletal: Negative for back pain, joint pain, myalgias and neck pain.  Neurological: Positive for headaches. Negative for dizziness, tingling, sensory change, speech change, focal weakness, loss of consciousness and weakness.    Physical Exam:  Vitals:   09/08/20 1553  BP: (!) 185/108  Pulse: 92  Temp: 99.2 F (37.3 C)  TempSrc: Oral  SpO2: 99%  Weight: 167 lb 14.4 oz (76.2 kg)   Physical Exam Constitutional:      General: She is not in acute distress.    Appearance: Normal appearance. She is not ill-appearing or toxic-appearing.     Comments: Answers questions  appropriately, NAD  Eyes:     General:        Right eye: No discharge.        Left eye: No discharge.     Conjunctiva/sclera: Conjunctivae normal.     Pupils: Pupils are equal, round, and reactive to light.  Cardiovascular:     Rate and Rhythm: Normal rate and regular rhythm.     Pulses: Normal pulses.     Heart sounds: Normal heart sounds. No murmur heard.  No friction rub. No gallop.   Pulmonary:     Effort: Pulmonary effort is normal.     Breath sounds: Normal breath sounds. No wheezing, rhonchi or rales.  Abdominal:     General: Abdomen is flat. Bowel sounds are normal.     Palpations: Abdomen is soft.     Tenderness: There is no abdominal tenderness. There is no guarding or rebound.  Musculoskeletal:        General: No swelling or tenderness.     Right lower leg: No edema.     Left lower leg: No edema.  Skin:    Findings: No bruising, erythema, lesion or rash.  Neurological:     General: No focal deficit present.     Mental Status: She is alert and oriented to person, place, and time.     Assessment &  Plan:   See Encounters Tab for problem based charting.  Patient discussed with Dr. Rebeca Alert

## 2020-09-09 NOTE — Assessment & Plan Note (Addendum)
BP Readings from Last 3 Encounters:  09/08/20 (!) 185/108  08/18/20 (!) 171/95  04/03/20 (!) 155/98    Patient presents to the clinic after a week of elevated systolic pressures in the the low 200s at home. She does note intermittent headaches over the past week. She denies changes in diet, excessive NSAID use, ETOH use, and states that she is taking all her blood pressure medications, but upon review of her anti-hypertensive, she is unsure which one she takes. According to her medication list she is on Norvasc, Coreg, Cozaar, Hydralazine. Her [place] was D/C last visit 2/2 to orthostatic hypotension and hydralazine was started.   Patient comes in for elevated pressures today, she is currently asymptomatic, but complains of intermittent headaches. She denies chest pain, proteinuria, light aversion, or other signs of end  organ damage. Patient states that she is adherent to her medical regimen, but unsure of what medications she is changing. Given her elevated pressures and unsure medication regimen, we do not want to increase medications she may not be taking, or prescribe medications that she is already taking. At this point in time, patient does not need hospital admission, will schedule another visit for tomorrow to go over all medications, before treatment. Patient instructed to get all medications for thorough review.  - Medication reconciliation visit tomorrow. - Continue close follow up with patient as she had elevated pressures 03/2020, and has not had a in house until today.

## 2020-09-09 NOTE — Progress Notes (Signed)
Internal Medicine Clinic Attending  Case discussed with Dr. Gilford Rile at the time of the visit.  We reviewed the resident's history and exam and pertinent patient test results.  I agree with the assessment, diagnosis, and plan of care documented in the resident's note.  Although BP is elevated and having headaches and blurry vision, normal neurologic exam and no active blurry vision during visit, no sign of hypertensive emergency or PRES requiring urgent intervention. Will adjust medications as described by Dr. Gilford Rile, but first need to clarify what she is actually taking.  Lenice Pressman, M.D., Ph.D.

## 2020-09-09 NOTE — Assessment & Plan Note (Addendum)
Follow up from appointment on 09/08/20. Patient states that she is feeling better today compared to yesterday. Today we discussed which patient medications is currently taking. Patient voiced her current medications and physician went through her chart medications. Medications were verified. Per chart review, Susan Holmes was on losartan, hydralazine, coreg, and amlodipine. She had only been taking Losartan and she ran out of her hydralazine this past weekend.   Given the recent discontinuation of hydralazine, she is undergoing reflex hypertension. She does have high pressure historically. Will slowly add back her blood pressure medications starting with amlodipine and coreg. Will follow up with a telehealth visit on Friday and in person appointment next week for formal blood pressure assessment.  - Reorder Coreg 12.5 twice daily  - Reorder amlodipine 5 mg daily  - Continue Losartan 100 mg daily  - Telehealth visit 09/12/2020 - FU appointment next week for BP

## 2020-09-09 NOTE — Progress Notes (Signed)
  [facility] Internal Medicine Residency Telephone Encounter Continuity Care Appointment  HPI:   This telephone encounter was created for [name] on 09/09/2020 for the following purpose/cc medication reconciliation and hypertension.   Past Medical History:  Past Medical History:  Diagnosis Date  . Anxiety   . Arthritis   . Chest pain 09/26/2018  . Congenital blindness    Left  eye  . Coronary artery disease    per cardiac cath 2015-- mild disease involving LAD and branches ,  LCFx and branches, and mild disease pRCA  . Depression   . Diabetes mellitus, type II ([place])    followed by pcp  . Glaucoma   . Glaucoma, both eyes   . Hepatitis B infection pt unsure   resolved  . History of kidney stones    right stone  . History of transient ischemic attack (TIA) 2017   per pt no residuals  . HTN (hypertension)    followed by pcp  . Hyperlipidemia   . PAD (peripheral artery disease) ([place])   . Poor dental hygiene   . Stroke ([place])   . Tobacco abuse    .5 PPD smoker      ROS:  Review of Systems  Constitutional: Negative for chills, fever and weight loss.  Cardiovascular: Negative for chest pain, palpitations and orthopnea.  Gastrointestinal: Negative for abdominal pain, constipation, diarrhea, nausea and vomiting.  Neurological: Negative for dizziness and headaches.       Assessment / Plan / Recommendations:   Please see A&P under problem oriented charting for assessment of the patient's acute and chronic medical conditions.   As always, pt is advised that if symptoms worsen or new symptoms arise, they should go to an urgent care facility or to to ER for further evaluation.   Consent and Medical Decision Making:   Patient discussed with Dr. Rebeca Alert  This is a telephone encounter between [name] and [name] on 09/09/2020 for hypertension and . The visit was conducted with the patient located at home and [name] at [facility]. The patient's  identity was confirmed using their DOB and current address. The patient has consented to being evaluated through a telephone encounter and understands the associated risks (an examination cannot be done and the patient may need to come in for an appointment) / benefits (allows the patient to remain at home, decreasing exposure to coronavirus). I personally spent 21 minutes on medical discussion.

## 2020-09-10 ENCOUNTER — Encounter: Payer: Self-pay | Admitting: Internal Medicine

## 2020-09-10 NOTE — Assessment & Plan Note (Signed)
Patient with extensive list of medications and unsure of which medications she is currently taking presents for this telehealth visit for medication reconciliation. Patient read off all her medications at home, followed by whether she is taking that current medication and the dosage. Provider went down her medication and list on file, and patient confirmed or stated that the drug and dosage was discontinued during that time.

## 2020-09-10 NOTE — Progress Notes (Signed)
Internal Medicine Clinic Attending  Case discussed with Dr. Winters at the time of the visit.  We reviewed the resident's history and exam and pertinent patient test results.  I agree with the assessment, diagnosis, and plan of care documented in the resident's note.  Alexander Raines, M.D., Ph.D.  

## 2020-09-12 ENCOUNTER — Other Ambulatory Visit: Payer: Self-pay | Admitting: Internal Medicine

## 2020-09-12 ENCOUNTER — Ambulatory Visit (INDEPENDENT_AMBULATORY_CARE_PROVIDER_SITE_OTHER): Payer: Medicare Other | Admitting: Internal Medicine

## 2020-09-12 ENCOUNTER — Other Ambulatory Visit: Payer: Self-pay

## 2020-09-12 ENCOUNTER — Encounter: Payer: Self-pay | Admitting: Internal Medicine

## 2020-09-12 ENCOUNTER — Ambulatory Visit: Payer: Medicare Other | Admitting: *Deleted

## 2020-09-12 DIAGNOSIS — I10 Essential (primary) hypertension: Secondary | ICD-10-CM

## 2020-09-12 DIAGNOSIS — E1165 Type 2 diabetes mellitus with hyperglycemia: Secondary | ICD-10-CM

## 2020-09-12 DIAGNOSIS — E1169 Type 2 diabetes mellitus with other specified complication: Secondary | ICD-10-CM

## 2020-09-12 NOTE — Patient Instructions (Signed)
Visit Information It was nice speaking with you today. Please check  you blood pressure daily and call you provider if the realigns are consistently  >140/>90.  Goals Addressed              This Visit's Progress     Patient Stated   .  " I just talked to my doctor today and yesterday about my blood pressure today because it's high again." (pt-stated)        CARE PLAN ENTRY (see longitudinal plan of care for additional care plan information)   Current Barriers:  Marland Kitchen Knowledge Deficits related to basic understanding of hypertension pathophysiology and self care management . Knowledge Deficits related to understanding of medications prescribed for management of hypertension- patient states she completed a telehealth visit today and that she was put on two new blood pressure medications, she says she does not have any questions about what medications she should be taking,  she reports daily blood sugar readings are meeting target, denies hypoglycemia, reports good medication taking behavior,  Case Manager Clinical Goal(s):  Marland Kitchen Over the next 30- 60 days, patient will verbalize understanding of plan for hypertension management . Over the next 30 - 60 days, patient will attend all scheduled medical appointments:  . Over the next 30 -60 days, patient will demonstrate improved adherence to prescribed treatment plan for hypertension as evidenced by taking all medications as prescribed, monitoring and recording blood pressure as directed, adhering to low sodium/DASH diet . Over the next 30 - 60 days, patient will demonstrate improved health management independence as evidenced by checking blood pressure as directed and notifying PCP if SBP>200 or DBP > 100, taking all medications as prescribe, and adhering to a low sodium diet as discussed.  Interventions:  . Appropriate assessments completed . Assessed home BP readings- encouraged her to be consistent in taking her blood pressure everyday and  recording readings since her treatment regimen was changed today . Assessed home blood sugar readings  . Assessed medication taking behavior . Assessed c/o left knee and leg pain- reviewed results lower extremity doppler done 8/20 - normal with no evidence of DVT . Again reminded patient she is overdue for annual diabetic eye exam . Thanked patient for completing her heath maintenance visit  . Explained indication for dopplers of LE tomorrow and directed her on how to find the vascular lab at [facility] . Reviewed upcoming appointment with podiatrist on 09/26/20 and clinic appointment on 09/29/20   Patient Self Care Activities:  . Able to independently manage blood pressure to meet target of <140/<90 and to manage blood sugar to meet Hgb A1C target . Self administers medications as prescribed . Attends all scheduled provider appointments . Calls provider office for new concerns, questions, or BP outside discussed parameters or seeks emergency assistance for symptoms of stroke . Checks BP and record as discussed . Checks blood sugar at least daily and calls provider for readings outside established parameters . Follows a low sodium diet/DASH diet . Eliminate energy drinks  Please see past updates related to this goal by clicking on the "Past Updates" button in the selected goal         Other   .  Blood Pressure < 140/90        BP Readings from Last 3 Encounters:  09/08/20 (!) 185/108  08/18/20 (!) 171/95  04/03/20 (!) 155/98     Not meeting blood pressure targets- medications changed today via telehealth and patient encouraged to  take her blood pressure at home everyday and record    .  HEMOGLOBIN A1C < 7        Lab Results  Component Value Date   HGBA1C 6.7 (A) 08/18/2020     Meeting Hgb A1C  target    .  LDL CALC < 100        Lab Results  Component Value Date   CHOL 147 08/18/2020   HDL 76 08/18/2020   LDLCALC 60 08/18/2020   LDLDIRECT 183 (H) 03/01/2014   TRIG 53  08/18/2020   CHOLHDL 1.9 08/18/2020          The patient verbalized understanding of instructions provided today and declined a print copy of patient instruction materials.   The care management team will reach out to the patient again over the next 30-60 days.   Kelli Churn RN, CCM, [facility] RN Care Manager [phone]

## 2020-09-12 NOTE — Progress Notes (Signed)
  [facility] Internal Medicine Residency Telephone Encounter Continuity Care Appointment  HPI:   This telephone encounter was created for [name] on 09/12/2020 for the following purpose/cc blood pressure follow up.   Past Medical History:  Past Medical History:  Diagnosis Date  . Anxiety   . Arthritis   . Chest pain 09/26/2018  . Congenital blindness    Left  eye  . Coronary artery disease    per cardiac cath 2015-- mild disease involving LAD and branches ,  LCFx and branches, and mild disease pRCA  . Depression   . Diabetes mellitus, type II ([place])    followed by pcp  . Glaucoma   . Glaucoma, both eyes   . Hepatitis B infection pt unsure   resolved  . History of kidney stones    right stone  . History of transient ischemic attack (TIA) 2017   per pt no residuals  . HTN (hypertension)    followed by pcp  . Hyperlipidemia   . PAD (peripheral artery disease) ([place])   . Poor dental hygiene   . Stroke ([place])   . Tobacco abuse    .5 PPD smoker      ROS:  Review of Systems  Constitutional: Negative for chills, fever and weight loss.  Eyes: Negative for blurred vision, double vision, photophobia and pain.  Cardiovascular: Negative for chest pain, palpitations and orthopnea.  Gastrointestinal: Negative for abdominal pain, constipation, diarrhea, nausea and vomiting.  Neurological: Negative for dizziness and headaches.       Assessment / Plan / Recommendations:   Please see A&P under problem oriented charting for assessment of the patient's acute and chronic medical conditions.   As always, pt is advised that if symptoms worsen or new symptoms arise, they should go to an urgent care facility or to to ER for further evaluation.   Consent and Medical Decision Making:   Patient discussed with Dr. Daryll Drown  This is a telephone encounter between Susan Holmes and Susan Holmes on 09/12/2020 for BP check. The visit was conducted with the patient located at home  and Susan Holmes at [facility]. The patient's identity was confirmed using their DOB and current address. The patient has consented to being evaluated through a telephone encounter and understands the associated risks (an examination cannot be done and the patient may need to come in for an appointment) / benefits (allows the patient to remain at home, decreasing exposure to coronavirus). I personally spent 16 minutes on medical discussion.

## 2020-09-12 NOTE — Chronic Care Management (AMB) (Signed)
Chronic Care Management   Follow Up Note   09/12/2020 Name: Susan Holmes MRN: [id number] DOB: 05-03-1956  Referred by: [name], MD Reason for referral : Chronic Care Management ( IDDM, HTN, HLD,  PVD)   Susan Holmes is a 64 y.o. year old female who is a primary care patient of Aslam, Loralyn Freshwater, MD. The CCM team was consulted for assistance with chronic disease management and care coordination needs.    Review of patient status, including review of consultants reports, relevant laboratory and other test results, and collaboration with appropriate care team members and the patient's provider was performed as part of comprehensive patient evaluation and provision of chronic care management services.    SDOH (Social Determinants of Health) assessments performed: No See Care Plan activities for detailed interventions related to [facility])     Outpatient Encounter Medications as of 09/12/2020  Medication Sig Note  . amLODipine (NORVASC) 10 MG tablet Take 0.5 tablets (5 mg total) by mouth every morning.   Marland Kitchen aspirin EC 81 MG tablet Take 81 mg by mouth daily.   . Blood Glucose Monitoring Suppl (ACCU-CHEK GUIDE ME) w/Device KIT Check blood sugar 3 times a day   . carvedilol (COREG) 12.5 MG tablet TAKE 1 TABLET(12.5 MG) BY MOUTH TWICE DAILY   . diclofenac Sodium (VOLTAREN) 1 % GEL    . dorzolamide-timolol (COSOPT) 22.3-6.8 MG/ML ophthalmic solution Place 1 drop into both eyes every evening.    . DULoxetine (CYMBALTA) 30 MG capsule TAKE 1 CAPSULE(30 MG) BY MOUTH DAILY   . fluticasone (FLONASE) 50 MCG/ACT nasal spray Place 1 spray into both nostrils daily.   Marland Kitchen gabapentin (NEURONTIN) 300 MG capsule Take 300 mg by mouth 3 (three) times daily.   Marland Kitchen glucose blood (ACCU-CHEK GUIDE) test strip Check blood sugar 3 times per day   . insulin glargine (LANTUS SOLOSTAR) 100 UNIT/ML Solostar Pen Inject 28 Units into the skin at bedtime.   . Insulin Pen Needle (VALUMARK PEN NEEDLES) 31G X 8 MM MISC 1 each by  Does not apply route daily.   . Lancets (ACCU-CHEK SOFT TOUCH) lancets Check blood sugar 3 times a day   . lidocaine (XYLOCAINE) 5 % ointment APPLY TOPICALLY TO THE AFFECTED AREA EVERY NIGHT FOR RIGHT SHOULDER PAIN   . losartan (COZAAR) 100 MG tablet TAKE 1 TABLET(100 MG) BY MOUTH DAILY   . meloxicam (MOBIC) 15 MG tablet TAKE 1 TABLET(15 MG) BY MOUTH DAILY   . mirtazapine (REMERON) 45 MG tablet Take 45 mg by mouth at bedtime.   . Multiple Vitamin (MULTIVITAMIN WITH MINERALS) TABS tablet Take 1 tablet by mouth daily.   Marland Kitchen NARCAN 4 MG/0.1ML LIQD nasal spray kit SPRAY AS DIRECTED 08/18/2020: Not needed  . oxyCODONE-acetaminophen (PERCOCET) 10-325 MG tablet Take 1 tablet by mouth 3 (three) times daily as needed. 08/18/2020: Taking BID  . polyethylene glycol powder (GLYCOLAX/MIRALAX) 17 GM/SCOOP powder SMARTSIG:1 Scoopful By Mouth 4 Times Daily   . prednisoLONE acetate (PRED FORTE) 1 % ophthalmic suspension Place 1 drop into the right eye daily.   . pregabalin (LYRICA) 100 MG capsule Take 100 mg by mouth 2 (two) times daily.   . rosuvastatin (CRESTOR) 20 MG tablet Take 1 tablet (20 mg total) by mouth at bedtime. NEED OV.   Marland Kitchen tiZANidine (ZANAFLEX) 4 MG tablet Take 4 mg by mouth 3 (three) times daily. (Patient not taking: Reported on 08/18/2020)   . traZODone (DESYREL) 100 MG tablet Take 300 mg by mouth at bedtime.    Marland Kitchen  TRULICITY 1.5 BJ/4.7WG SOPN ADMINISTER 1.5 MG UNDER THE SKIN EVERY SATURDAY    No facility-administered encounter medications on file as of 09/12/2020.     Objective:  Wt Readings from Last 3 Encounters:  09/08/20 167 lb 14.4 oz (76.2 kg)  08/18/20 179 lb (81.2 kg)  04/03/20 162 lb 8 oz (73.7 kg)      Goals Addressed              This Visit's Progress     Patient Stated   .  " I just talked to my doctor today and yesterday about my blood pressure today because it's high again." (pt-stated)        CARE PLAN ENTRY (see longitudinal plan of care for additional care plan  information)   Current Barriers:  Marland Kitchen Knowledge Deficits related to basic understanding of hypertension pathophysiology and self care management . Knowledge Deficits related to understanding of medications prescribed for management of hypertension- patient states she completed a telehealth visit today and that she was put on two new blood pressure medications, she says she does not have any questions about what medications she should be taking,  she reports daily blood sugar readings are meeting target, denies hypoglycemia, reports good medication taking behavior,  Case Manager Clinical Goal(s):  Marland Kitchen Over the next 30- 60 days, patient will verbalize understanding of plan for hypertension management . Over the next 30 - 60 days, patient will attend all scheduled medical appointments:  . Over the next 30 -60 days, patient will demonstrate improved adherence to prescribed treatment plan for hypertension as evidenced by taking all medications as prescribed, monitoring and recording blood pressure as directed, adhering to low sodium/DASH diet . Over the next 30 - 60 days, patient will demonstrate improved health management independence as evidenced by checking blood pressure as directed and notifying PCP if SBP>200 or DBP > 100, taking all medications as prescribe, and adhering to a low sodium diet as discussed.  Interventions:  . Appropriate assessments completed . Assessed home BP readings, reviewed blood pressure targets- encouraged her to be consistent in taking her blood pressure everyday and recording readings since her treatment regimen was changed today . Advised her to call clinic provider if her blood pressure readings are consistently >140/>90 . Assessed home blood sugar readings  . Assessed medication taking behavior . Assessed c/o left knee and leg pain- reviewed results lower extremity doppler done 8/20 - normal with no evidence of DVT . Again reminded patient she is overdue for annual  diabetic eye exam . Thanked patient for completing her heath maintenance visit  . Explained indication for dopplers of LE tomorrow and directed her on how to find the vascular lab at [facility] . Reviewed upcoming appointment with podiatrist on 09/26/20 and clinic appointment on 09/29/20   Patient Self Care Activities:  . Able to independently manage blood pressure to meet target of <140/<90 and to manage blood sugar to meet Hgb A1C target . Self administers medications as prescribed . Attends all scheduled provider appointments . Calls provider office for new concerns, questions, or BP outside discussed parameters or seeks emergency assistance for symptoms of stroke . Checks BP and record as discussed . Checks blood sugar at least daily and calls provider for readings outside established parameters . Follows a low sodium diet/DASH diet . Eliminate energy drinks  Please see past updates related to this goal by clicking on the "Past Updates" button in the selected goal  Other   .  Blood Pressure < 140/90        BP Readings from Last 3 Encounters:  09/08/20 (!) 185/108  08/18/20 (!) 171/95  04/03/20 (!) 155/98     Not meeting blood pressure targets- medications changed today via telehealth and patient encouraged to take her blood pressure at home everyday and record    .  HEMOGLOBIN A1C < 7        Lab Results  Component Value Date   HGBA1C 6.7 (A) 08/18/2020     Meeting Hgb A1C  target    .  LDL CALC < 100        Lab Results  Component Value Date   CHOL 147 08/18/2020   HDL 76 08/18/2020   LDLCALC 60 08/18/2020   LDLDIRECT 183 (H) 03/01/2014   TRIG 53 08/18/2020   CHOLHDL 1.9 08/18/2020          Plan:   The care management team will reach out to the patient again over the next 30-60 days.    Kelli Churn RN, CCM, [facility] RN Care Manager [phone]

## 2020-09-12 NOTE — Progress Notes (Signed)
Internal Medicine Clinic Resident  I have personally reviewed this encounter including the documentation in this note and/or discussed this patient with the care management provider. I will address any urgent items identified by the care management provider and will communicate my actions to the patient's PCP. I have reviewed the patient's CCM visit with my supervising attending, Dr Heber Weston.  Marty Heck, DO 09/12/2020

## 2020-09-14 ENCOUNTER — Encounter: Payer: Self-pay | Admitting: Internal Medicine

## 2020-09-14 NOTE — Assessment & Plan Note (Signed)
Spoke with Susan Holmes today for follow up on her elevated pressures from 9/14 visit. Patient states that she feels much better, and that her highest blood pressure today was 165/100, but her pressures have been lower since starting back on her medications. Discussed today that we would increase her Norvasc to 10 mg daily, to further optimize her BP before her visit next week. Patient voiced understanding.  - Increased Norvasc 10 mg daily  - Coreg 12.5 twice daily  - Losartan 100 mg daily

## 2020-09-17 NOTE — Progress Notes (Signed)
Internal Medicine Clinic Attending  Case discussed with Dr. Gilford Rile  At the time of the visit.  We reviewed the resident's history and pertinent patient test results.  I agree with the assessment, diagnosis, and plan of care documented in the resident's note.

## 2020-09-20 ENCOUNTER — Other Ambulatory Visit: Payer: Self-pay | Admitting: Internal Medicine

## 2020-09-20 DIAGNOSIS — E1165 Type 2 diabetes mellitus with hyperglycemia: Secondary | ICD-10-CM

## 2020-09-22 ENCOUNTER — Other Ambulatory Visit: Payer: Self-pay | Admitting: Internal Medicine

## 2020-09-26 ENCOUNTER — Ambulatory Visit (INDEPENDENT_AMBULATORY_CARE_PROVIDER_SITE_OTHER): Payer: Medicare Other | Admitting: Podiatry

## 2020-09-26 ENCOUNTER — Other Ambulatory Visit: Payer: Self-pay

## 2020-09-26 ENCOUNTER — Encounter: Payer: Self-pay | Admitting: Podiatry

## 2020-09-26 DIAGNOSIS — B351 Tinea unguium: Secondary | ICD-10-CM | POA: Diagnosis not present

## 2020-09-26 DIAGNOSIS — E1151 Type 2 diabetes mellitus with diabetic peripheral angiopathy without gangrene: Secondary | ICD-10-CM | POA: Diagnosis not present

## 2020-09-26 DIAGNOSIS — L84 Corns and callosities: Secondary | ICD-10-CM

## 2020-09-26 DIAGNOSIS — M2041 Other hammer toe(s) (acquired), right foot: Secondary | ICD-10-CM

## 2020-09-26 DIAGNOSIS — M79675 Pain in left toe(s): Secondary | ICD-10-CM | POA: Diagnosis not present

## 2020-09-26 DIAGNOSIS — M2042 Other hammer toe(s) (acquired), left foot: Secondary | ICD-10-CM

## 2020-09-26 DIAGNOSIS — M79674 Pain in right toe(s): Secondary | ICD-10-CM

## 2020-09-28 NOTE — Progress Notes (Signed)
Subjective: Susan Holmes presents today at risk foot care. Pt has h/o NIDDM with PAD and painful callus(es) b/l and painful mycotic toenails b/l that are difficult to trim. Pain interferes with ambulation. Aggravating factors include wearing enclosed shoe gear. Pain is relieved with periodic professional debridement.   She did not check her blood sugar this morning.  Susan Heck, MD is patient's PCP. Last visit was: 08/18/2020.  She voices no new pedal concerns on today's visit.  Past Medical History:  Diagnosis Date  . Anxiety   . Arthritis   . Chest pain 09/26/2018  . Congenital blindness    Left  eye  . Coronary artery disease    per cardiac cath 2015-- mild disease involving LAD and branches ,  LCFx and branches, and mild disease pRCA  . Depression   . Diabetes mellitus, type II ([place])    followed by pcp  . Glaucoma   . Glaucoma, both eyes   . Hepatitis B infection pt unsure   resolved  . History of kidney stones    right stone  . History of transient ischemic attack (TIA) 2017   per pt no residuals  . HTN (hypertension)    followed by pcp  . Hyperlipidemia   . PAD (peripheral artery disease) ([place])   . Poor dental hygiene   . Stroke ([place])   . Tobacco abuse    .5 PPD smoker     Patient Active Problem List   Diagnosis Date Noted  . Polypharmacy 09/09/2020  . Neck pain on left side 12/17/2019  . PAD (peripheral artery disease) ([place]) 10/22/2019  . Sinus congestion 10/11/2019  . PVD (peripheral vascular disease) ([place]) 10/04/2019  . Insulin dependent type 2 diabetes mellitus ([place]) 10/04/2019  . Claudication ([place]) 09/14/2019  . Left foot pain 07/12/2019  . Paronychia of great toe, left 06/21/2019  . Diaphoresis 02/15/2019  . Nephrolithiasis 10/03/2018  . Left chest pressure 09/26/2018  . Upper back pain on right side 09/19/2018  . Callus of foot 09/19/2018  . Reactive airway disease 06/20/2018  . Unintentional weight loss 06/14/2018  . History of vitamin D  deficiency 09/06/2017  . Forgetfulness 06/07/2017  . Dyspepsia 04/26/2017  . History of CVA (cerebrovascular accident) 07/23/2016  . Cough 07/08/2016  . Lower back pain 08/06/2015  . At risk for polypharmacy 08/06/2015  . Osteoarthritis 12/11/2014  . History of chest pain 10/07/2014  . Pre-operative cardiovascular examination 10/07/2014  . Diarrhea 04/04/2014  . Liver hemangioma 04/04/2014  . RBC microcytosis 02/05/2014  . Hyperparathyroidism ([place]) 03/08/2013  . Calcific tendinitis of right shoulder 02/26/2012  . Fatigue 12/14/2011  . Uncontrolled type II diabetes mellitus ([place]) 06/12/2007  . Hyperlipidemia associated with type 2 diabetes mellitus ([place]) 06/12/2007  . ANXIETY 06/12/2007  . TOBACCO ABUSE 06/12/2007  . Depression 06/12/2007  . CONGENITAL BLINDNESS, LEFT EYE 06/12/2007  . Essential hypertension 06/12/2007    Current Outpatient Medications on File Prior to Visit  Medication Sig Dispense Refill  . allopurinol (ZYLOPRIM) 100 MG tablet Take 100 mg by mouth daily.    Marland Kitchen amLODipine (NORVASC) 10 MG tablet Take 0.5 tablets (5 mg total) by mouth every morning. 45 tablet 1  . aspirin EC 81 MG tablet Take 81 mg by mouth daily.    . Blood Glucose Monitoring Suppl (ACCU-CHEK GUIDE ME) w/Device KIT Check blood sugar 3 times a day 1 kit 0  . carvedilol (COREG) 12.5 MG tablet TAKE 1 TABLET(12.5 MG) BY MOUTH TWICE DAILY 60 tablet 6  .  diclofenac Sodium (VOLTAREN) 1 % GEL     . dorzolamide-timolol (COSOPT) 22.3-6.8 MG/ML ophthalmic solution Place 1 drop into both eyes every evening.     . DULoxetine (CYMBALTA) 30 MG capsule TAKE 1 CAPSULE(30 MG) BY MOUTH DAILY 90 capsule 2  . fluticasone (FLONASE) 50 MCG/ACT nasal spray Place 1 spray into both nostrils daily. 18.2 mL 0  . gabapentin (NEURONTIN) 300 MG capsule Take 300 mg by mouth 3 (three) times daily.    Marland Kitchen glucose blood (ACCU-CHEK GUIDE) test strip Check blood sugar 3 times per day 100 each 12  . insulin glargine (LANTUS SOLOSTAR)  100 UNIT/ML Solostar Pen Inject 28 Units into the skin at bedtime. 15 mL 1  . Insulin Pen Needle (VALUMARK PEN NEEDLES) 31G X 8 MM MISC 1 each by Does not apply route daily. 100 each 3  . Lancets (ACCU-CHEK SOFT TOUCH) lancets Check blood sugar 3 times a day 100 each 12  . lidocaine (XYLOCAINE) 5 % ointment APPLY TOPICALLY TO THE AFFECTED AREA EVERY NIGHT FOR RIGHT SHOULDER PAIN    . LINZESS 145 MCG CAPS capsule Take 145 mcg by mouth daily.    Marland Kitchen LINZESS 72 MCG capsule Take 72 mcg by mouth daily.    Marland Kitchen losartan (COZAAR) 100 MG tablet TAKE 1 TABLET(100 MG) BY MOUTH DAILY 90 tablet 1  . meloxicam (MOBIC) 15 MG tablet TAKE 1 TABLET(15 MG) BY MOUTH DAILY 90 tablet 0  . mirtazapine (REMERON) 45 MG tablet Take 45 mg by mouth at bedtime.    . Multiple Vitamin (MULTIVITAMIN WITH MINERALS) TABS tablet Take 1 tablet by mouth daily.    Marland Kitchen NARCAN 4 MG/0.1ML LIQD nasal spray kit SPRAY AS DIRECTED    . oxyCODONE (ROXICODONE) 15 MG immediate release tablet Take 15 mg by mouth 4 (four) times daily as needed.    Marland Kitchen oxyCODONE-acetaminophen (PERCOCET) 10-325 MG tablet Take 1 tablet by mouth 3 (three) times daily as needed.    . polyethylene glycol powder (GLYCOLAX/MIRALAX) 17 GM/SCOOP powder SMARTSIG:1 Scoopful By Mouth 4 Times Daily    . prednisoLONE acetate (PRED FORTE) 1 % ophthalmic suspension Place 1 drop into the right eye daily.    . pregabalin (LYRICA) 100 MG capsule Take 100 mg by mouth 2 (two) times daily.    . pregabalin (LYRICA) 150 MG capsule TAKE 1 CAPSULE BY MOUTH IN THE EVENING    . rosuvastatin (CRESTOR) 20 MG tablet Take 1 tablet (20 mg total) by mouth at bedtime. NEED OV. 30 tablet 2  . tiZANidine (ZANAFLEX) 4 MG tablet Take 4 mg by mouth 3 (three) times daily. (Patient not taking: Reported on 08/18/2020)    . traZODone (DESYREL) 100 MG tablet Take 300 mg by mouth at bedtime.     . TRULICITY 1.5 LJ/4.4BE SOPN ADMINISTER 1.5 MG UNDER THE SKIN EVERY SATURDAY 6 mL 1   No current facility-administered  medications on file prior to visit.     Allergies  Allergen Reactions  . Ace Inhibitors Cough  . Lipitor [Atorvastatin] Other (See Comments)    MYALGIA   . Chantix [Varenicline] Nausea And Vomiting    Objective: Susan Holmes is a pleasant 64 y.o. African American female WD, WN in NAD. AAO x 3.  There were no vitals filed for this visit.  Vascular Examination: Capillary refill time to digits immediate b/l. Palpable DP pulse(s) b/l lower extremities Nonpalpable PT pulse(s) b/l lower extremities. Pedal hair absent. Lower extremity skin temperature gradient within normal limits. No pain with calf  compression b/l. No ischemia or gangrene noted b/l lower extremities.  Dermatological Examination: Pedal skin is thin shiny, atrophic b/l lower extremities. No open wounds bilaterally. No interdigital macerations bilaterally. Toenails 1-5 b/l elongated, discolored, dystrophic, thickened, crumbly with subungual debris and tenderness to dorsal palpation. Hyperkeratotic lesion(s) submet head 2 right foot, submet head 4 left foot, submet head 5 right foot and plantar aspect of heel .  No erythema, no edema, no drainage, no flocculence.  Musculoskeletal: Normal muscle strength 5/5 to all lower extremity muscle groups bilaterally. No pain crepitus or joint limitation noted with ROM b/l. Hammertoes noted to the 2-5 bilaterally.  Neurological Examination: Protective sensation intact 5/5 intact bilaterally with 10g monofilament b/l. Vibratory sensation intact b/l. Proprioception intact bilaterally.  Last A1c: Hemoglobin A1C Latest Ref Rng & Units 08/18/2020 02/06/2020 10/10/2019  HGBA1C 4.0 - 5.6 % 6.7(A) 6.5(A) 10.2(A)  Some recent data might be hidden     Assessment: 1. Pain due to onychomycosis of toenails of both feet   2. Callus   3. Acquired hammertoes of both feet   4. Diabetes mellitus type 2 with peripheral artery disease ([place])    Plan: -Examined patient. -No new findings. No new  orders. -Continue diabetic foot care principles. -Toenails 1-5 b/l were debrided in length and girth with sterile nail nippers and dremel without iatrogenic bleeding.  -Callus(es) submet head 2 right foot, submet head 4 left foot, submet head 5 right foot and plantar aspect of heel  pared utilizing sterile scalpel blade without complication or incident. Total number debrided =4. -Patient to report any pedal injuries to medical professional immediately. -Patient to continue soft, supportive shoe gear daily. -Patient/POA to call should there be question/concern in the interim.  Return in about 3 months (around 12/27/2020).  [name], DPM

## 2020-09-29 ENCOUNTER — Other Ambulatory Visit: Payer: Self-pay | Admitting: Internal Medicine

## 2020-09-29 ENCOUNTER — Encounter: Payer: Self-pay | Admitting: Internal Medicine

## 2020-09-29 ENCOUNTER — Ambulatory Visit (INDEPENDENT_AMBULATORY_CARE_PROVIDER_SITE_OTHER): Payer: Medicare Other | Admitting: Internal Medicine

## 2020-09-29 ENCOUNTER — Other Ambulatory Visit: Payer: Self-pay

## 2020-09-29 ENCOUNTER — Ambulatory Visit: Payer: Medicare Other | Admitting: *Deleted

## 2020-09-29 VITALS — BP 145/76 | HR 64 | Temp 98.6°F | Ht 67.0 in | Wt 169.0 lb

## 2020-09-29 DIAGNOSIS — E1165 Type 2 diabetes mellitus with hyperglycemia: Secondary | ICD-10-CM

## 2020-09-29 DIAGNOSIS — E785 Hyperlipidemia, unspecified: Secondary | ICD-10-CM

## 2020-09-29 DIAGNOSIS — E119 Type 2 diabetes mellitus without complications: Secondary | ICD-10-CM

## 2020-09-29 DIAGNOSIS — Z23 Encounter for immunization: Secondary | ICD-10-CM | POA: Diagnosis not present

## 2020-09-29 DIAGNOSIS — I10 Essential (primary) hypertension: Secondary | ICD-10-CM

## 2020-09-29 DIAGNOSIS — E1169 Type 2 diabetes mellitus with other specified complication: Secondary | ICD-10-CM

## 2020-09-29 DIAGNOSIS — E1149 Type 2 diabetes mellitus with other diabetic neurological complication: Secondary | ICD-10-CM | POA: Diagnosis not present

## 2020-09-29 DIAGNOSIS — Z79899 Other long term (current) drug therapy: Secondary | ICD-10-CM

## 2020-09-29 DIAGNOSIS — Z794 Long term (current) use of insulin: Secondary | ICD-10-CM

## 2020-09-29 DIAGNOSIS — IMO0002 Reserved for concepts with insufficient information to code with codable children: Secondary | ICD-10-CM

## 2020-09-29 DIAGNOSIS — F32A Depression, unspecified: Secondary | ICD-10-CM | POA: Diagnosis not present

## 2020-09-29 MED ORDER — ALLOPURINOL 100 MG PO TABS: 100.0000 mg | ORAL_TABLET | Freq: Every day | ORAL | 1 refills | Status: DC

## 2020-09-29 MED ORDER — PREGABALIN 100 MG PO CAPS: 100.0000 mg | ORAL_CAPSULE | Freq: Two times a day (BID) | ORAL | 2 refills | Status: DC

## 2020-09-29 MED ORDER — POLYETHYLENE GLYCOL 3350 17 GM/SCOOP PO POWD
ORAL | 1 refills | Status: DC
Start: 2020-09-29 — End: 2021-09-23

## 2020-09-29 MED ORDER — TRULICITY 1.5 MG/0.5ML ~~LOC~~ SOAJ: SUBCUTANEOUS | 1 refills | Status: DC

## 2020-09-29 MED ORDER — LANTUS SOLOSTAR 100 UNIT/ML ~~LOC~~ SOPN: 28.0000 [IU] | PEN_INJECTOR | Freq: Every day | SUBCUTANEOUS | 1 refills | Status: DC

## 2020-09-29 MED ORDER — MIRTAZAPINE 45 MG PO TABS
45.0000 mg | ORAL_TABLET | Freq: Every day | ORAL | 1 refills | Status: DC
Start: 2020-09-29 — End: 2024-06-06

## 2020-09-29 MED ORDER — DULOXETINE HCL 30 MG PO CPEP: ORAL_CAPSULE | ORAL | 2 refills | Status: DC

## 2020-09-29 MED ORDER — DORZOLAMIDE HCL-TIMOLOL MAL 2-0.5 % OP SOLN: 1.0000 [drp] | Freq: Every evening | OPHTHALMIC | 1 refills | Status: DC

## 2020-09-29 MED ORDER — ACCU-CHEK SOFT TOUCH LANCETS MISC: 12 refills | Status: DC

## 2020-09-29 MED ORDER — ROSUVASTATIN CALCIUM 20 MG PO TABS
20.0000 mg | ORAL_TABLET | Freq: Every day | ORAL | 2 refills | Status: DC
Start: 2020-09-29 — End: 2021-01-02

## 2020-09-29 MED ORDER — ACCU-CHEK GUIDE VI STRP: ORAL_STRIP | 12 refills | Status: DC

## 2020-09-29 MED ORDER — PREDNISOLONE ACETATE 1 % OP SUSP
1.0000 [drp] | Freq: Every day | OPHTHALMIC | 1 refills | Status: DC
Start: 2020-09-29 — End: 2023-05-04

## 2020-09-29 MED ORDER — CARVEDILOL 12.5 MG PO TABS: ORAL_TABLET | ORAL | 6 refills | Status: DC

## 2020-09-29 MED ORDER — VALUMARK PEN NEEDLES 31G X 8 MM MISC: 1.0000 | Freq: Every day | 3 refills | Status: DC

## 2020-09-29 MED ORDER — ASPIRIN EC 81 MG PO TBEC: 81.0000 mg | DELAYED_RELEASE_TABLET | Freq: Every day | ORAL | 1 refills | Status: DC

## 2020-09-29 MED ORDER — AMLODIPINE-VALSARTAN-HCTZ 10-160-12.5 MG PO TABS: 1.0000 | ORAL_TABLET | Freq: Every day | ORAL | 1 refills | Status: DC

## 2020-09-29 NOTE — Patient Instructions (Signed)
Ms. Depaoli,  It was a pleasure seeing you in clinic. Today we discussed:   Hypertension: At this time, please discontinue your amlodipine and losartan. I have sent in a combination pill for amlodipine-valsartan-HCTZ to your pharmacy. Please take this once daily.  Depression: I have increased your duloxetine to 60mg  daily. I will send referral to a counselor.  Diabetes: Please continue with your current insulin regimen. Please continue to check your blood sugars at home and bring your meter to your next visit. Please follow up your appointment with Dr. Katy Fitch.   If you have any questions or concerns, please call our clinic at [phone] between 9am-5pm and after hours call [phone] and ask for the internal medicine resident on call. If you feel you are having a medical emergency please call 911.   Thank you, we look forward to helping you remain healthy!

## 2020-09-29 NOTE — Chronic Care Management (AMB) (Signed)
Chronic Care Management   Follow Up Note   09/29/2020 Name: Susan Holmes MRN: [id number] DOB: 1956/03/13  Referred by: [name], MD Reason for referral : Chronic Care Management (IDDM, HTN, HLD,  PVD, left knee and leg pain)   Susan Holmes is a 64 y.o. year old female who is a primary care patient of Aslam, Loralyn Freshwater, MD. The CCM team was consulted for assistance with chronic disease management and care coordination needs.    Review of patient status, including review of consultants reports, relevant laboratory and other test results, and collaboration with appropriate care team members and the patient's provider was performed as part of comprehensive patient evaluation and provision of chronic care management services.    SDOH (Social Determinants of Health) assessments performed: No See Care Plan activities for detailed interventions related to [facility])     Outpatient Encounter Medications as of 09/29/2020  Medication Sig Note  . allopurinol (ZYLOPRIM) 100 MG tablet Take 100 mg by mouth daily.   Marland Kitchen amLODipine (NORVASC) 10 MG tablet Take 0.5 tablets (5 mg total) by mouth every morning.   Marland Kitchen aspirin EC 81 MG tablet Take 81 mg by mouth daily.   . Blood Glucose Monitoring Suppl (ACCU-CHEK GUIDE ME) w/Device KIT Check blood sugar 3 times a day   . carvedilol (COREG) 12.5 MG tablet TAKE 1 TABLET(12.5 MG) BY MOUTH TWICE DAILY   . diclofenac Sodium (VOLTAREN) 1 % GEL    . dorzolamide-timolol (COSOPT) 22.3-6.8 MG/ML ophthalmic solution Place 1 drop into both eyes every evening.    . DULoxetine (CYMBALTA) 30 MG capsule TAKE 1 CAPSULE(30 MG) BY MOUTH DAILY   . fluticasone (FLONASE) 50 MCG/ACT nasal spray Place 1 spray into both nostrils daily.   Marland Kitchen gabapentin (NEURONTIN) 300 MG capsule Take 300 mg by mouth 3 (three) times daily.   Marland Kitchen glucose blood (ACCU-CHEK GUIDE) test strip Check blood sugar 3 times per day   . insulin glargine (LANTUS SOLOSTAR) 100 UNIT/ML Solostar Pen Inject 28 Units into  the skin at bedtime.   . Insulin Pen Needle (VALUMARK PEN NEEDLES) 31G X 8 MM MISC 1 each by Does not apply route daily.   . Lancets (ACCU-CHEK SOFT TOUCH) lancets Check blood sugar 3 times a day   . lidocaine (XYLOCAINE) 5 % ointment APPLY TOPICALLY TO THE AFFECTED AREA EVERY NIGHT FOR RIGHT SHOULDER PAIN   . LINZESS 145 MCG CAPS capsule Take 145 mcg by mouth daily.   Marland Kitchen LINZESS 72 MCG capsule Take 72 mcg by mouth daily.   Marland Kitchen losartan (COZAAR) 100 MG tablet TAKE 1 TABLET(100 MG) BY MOUTH DAILY   . meloxicam (MOBIC) 15 MG tablet TAKE 1 TABLET(15 MG) BY MOUTH DAILY   . mirtazapine (REMERON) 45 MG tablet Take 45 mg by mouth at bedtime.   . Multiple Vitamin (MULTIVITAMIN WITH MINERALS) TABS tablet Take 1 tablet by mouth daily.   Marland Kitchen NARCAN 4 MG/0.1ML LIQD nasal spray kit SPRAY AS DIRECTED 08/18/2020: Not needed  . oxyCODONE (ROXICODONE) 15 MG immediate release tablet Take 15 mg by mouth 4 (four) times daily as needed.   Marland Kitchen oxyCODONE-acetaminophen (PERCOCET) 10-325 MG tablet Take 1 tablet by mouth 3 (three) times daily as needed. 08/18/2020: Taking BID  . polyethylene glycol powder (GLYCOLAX/MIRALAX) 17 GM/SCOOP powder SMARTSIG:1 Scoopful By Mouth 4 Times Daily   . prednisoLONE acetate (PRED FORTE) 1 % ophthalmic suspension Place 1 drop into the right eye daily.   . pregabalin (LYRICA) 100 MG capsule Take 100 mg by  mouth 2 (two) times daily.   . pregabalin (LYRICA) 150 MG capsule TAKE 1 CAPSULE BY MOUTH IN THE EVENING   . rosuvastatin (CRESTOR) 20 MG tablet Take 1 tablet (20 mg total) by mouth at bedtime. NEED OV.   Marland Kitchen tiZANidine (ZANAFLEX) 4 MG tablet Take 4 mg by mouth 3 (three) times daily. (Patient not taking: Reported on 08/18/2020)   . traZODone (DESYREL) 100 MG tablet Take 300 mg by mouth at bedtime.    . TRULICITY 1.5 PN/3.6RW SOPN ADMINISTER 1.5 MG UNDER THE SKIN EVERY SATURDAY    No facility-administered encounter medications on file as of 09/29/2020.     Objective:  BP Readings from Last 3  Encounters:  09/29/20 (!) 145/76  09/08/20 (!) 185/108  08/18/20 (!) 171/95   Lab Results  Component Value Date   CHOL 147 08/18/2020   HDL 76 08/18/2020   LDLCALC 60 08/18/2020   LDLDIRECT 183 (H) 03/01/2014   TRIG 53 08/18/2020   CHOLHDL 1.9 08/18/2020   Lab Results  Component Value Date   HGBA1C 6.7 (A) 08/18/2020   HGBA1C 6.5 (A) 02/06/2020   HGBA1C 10.2 (A) 10/10/2019   Lab Results  Component Value Date   MICROALBUR 31.8 (H) 11/24/2017   LDLCALC 60 08/18/2020   CREATININE 0.87 09/08/2020    Goals Addressed              This Visit's Progress     Patient Stated   .  " I just talked to my doctor today and yesterday about my blood pressure today because it's high again." (pt-stated)        CARE PLAN ENTRY (see longitudinal plan of care for additional care plan information)   Current Barriers:  Marland Kitchen Knowledge Deficits related to basic understanding of hypertension pathophysiology and self care management . Knowledge Deficits related to understanding of medications prescribed for management of hypertension- met patient during her clinic visit to follow up on her blood pressure, she reports daily blood sugar readings are meeting target, denies hypoglycemia, reports good medication taking behavior.  Case Manager Clinical Goal(s):  Marland Kitchen Over the next 30- 60 days, patient will verbalize understanding of plan for hypertension management . Over the next 30 - 60 days, patient will attend all scheduled medical appointments:  . Over the next 30 -60 days, patient will demonstrate improved adherence to prescribed treatment plan for hypertension as evidenced by taking all medications as prescribed, monitoring and recording blood pressure as directed, adhering to low sodium/DASH diet . Over the next 30 - 60 days, patient will demonstrate improved health management independence as evidenced by checking blood pressure as directed and notifying PCP if SBP>200 or DBP > 100, taking all  medications as prescribe, and adhering to a low sodium diet as discussed.  Interventions:  . Appropriate assessments completed . Assessed home BP readings, reviewed blood pressure targets- encouraged her to be consistent in taking her blood pressure everyday and recording readings since her treatment regimen was changed today . Advised her to call clinic provider if her blood pressure readings are consistently >140/>90 . Assessed home blood sugar readings  . Assessed medication taking behavior . Again reminded patient she is overdue for annual diabetic eye exam . 10/4 Introduced CCM RN to patient and provided her with a [employer] spiral bound notebook and the business cards for this CCM RN and the [facility]   Patient Self Care Activities:  . Able to independently manage blood pressure  to meet target of <140/<90 and to manage blood sugar to meet Hgb A1C target . Self administers medications as prescribed . Attends all scheduled provider appointments . Calls provider office for new concerns, questions, or BP outside discussed parameters or seeks emergency assistance for symptoms of stroke . Checks BP and record as discussed . Checks blood sugar at least daily and calls provider for readings outside established parameters . Follows a low sodium diet/DASH diet . Eliminate energy drinks  Please see past updates related to this goal by clicking on the "Past Updates" button in the selected goal           Plan:   The care management team will reach out to the patient again over the next 30-60 days.    Kelli Churn RN, CCM, [facility] RN Care Manager [phone]

## 2020-09-29 NOTE — Progress Notes (Signed)
Internal Medicine Clinic Resident  I have personally reviewed this encounter including the documentation in this note and/or discussed this patient with the care management provider. I will address any urgent items identified by the care management provider and will communicate my actions to the patient's PCP. I have reviewed the patient's CCM visit with my supervising attending, Dr Mullen.  Jamelah Sitzer, MD  [facility] PGY-2 09/29/2020    

## 2020-09-29 NOTE — Patient Instructions (Signed)
Visit Information It was nice meeting you in person today. Goals Addressed              This Visit's Progress     Patient Stated   .  " I just talked to my doctor today and yesterday about my blood pressure today because it's high again." (pt-stated)        CARE PLAN ENTRY (see longitudinal plan of care for additional care plan information)   Current Barriers:  Marland Kitchen Knowledge Deficits related to basic understanding of hypertension pathophysiology and self care management . Knowledge Deficits related to understanding of medications prescribed for management of hypertension- met patient during her clinic visit to follow up on her blood pressure, she reports daily blood sugar readings are meeting target, denies hypoglycemia, reports good medication taking behavior.  Case Manager Clinical Goal(s):  Marland Kitchen Over the next 30- 60 days, patient will verbalize understanding of plan for hypertension management . Over the next 30 - 60 days, patient will attend all scheduled medical appointments:  . Over the next 30 -60 days, patient will demonstrate improved adherence to prescribed treatment plan for hypertension as evidenced by taking all medications as prescribed, monitoring and recording blood pressure as directed, adhering to low sodium/DASH diet . Over the next 30 - 60 days, patient will demonstrate improved health management independence as evidenced by checking blood pressure as directed and notifying PCP if SBP>200 or DBP > 100, taking all medications as prescribe, and adhering to a low sodium diet as discussed.  Interventions:  . Appropriate assessments completed . Assessed home BP readings, reviewed blood pressure targets- encouraged her to be consistent in taking her blood pressure everyday and recording readings since her treatment regimen was changed today . Advised her to call clinic provider if her blood pressure readings are consistently >140/>90 . Assessed home blood sugar readings   . Assessed medication taking behavior . Again reminded patient she is overdue for annual diabetic eye exam . 10/4 Introduced CCM RN to patient and provided her with a [employer] spiral bound notebook and the business cards for this CCM RN and the [place]   Patient Self Care Activities:  . Able to independently manage blood pressure to meet target of <140/<90 and to manage blood sugar to meet Hgb A1C target . Self administers medications as prescribed . Attends all scheduled provider appointments . Calls provider office for new concerns, questions, or BP outside discussed parameters or seeks emergency assistance for symptoms of stroke . Checks BP and record as discussed . Checks blood sugar at least daily and calls provider for readings outside established parameters . Follows a low sodium diet/DASH diet . Eliminate energy drinks  Please see past updates related to this goal by clicking on the "Past Updates" button in the selected goal          The patient verbalized understanding of instructions provided today and declined a print copy of patient instruction materials.   The care management team will reach out to the patient again over the next 30-60 days.   Kelli Churn RN, CCM, [facility] RN Care Manager [phone]

## 2020-09-30 ENCOUNTER — Encounter: Payer: Self-pay | Admitting: *Deleted

## 2020-09-30 NOTE — Assessment & Plan Note (Signed)
Patient has extensive list of medications that she is taking.  On telehealth visit from 9/15, medication reconciliation completed by Dr. Gilford Rile.  At this visit, we addressed patient's medications.  Multiple medications including trazodone, tizanidine, Percocet,Meloxicam, losartan, Linzess, gabapentin and amlodipine were discontinued.  Patient started on combination pill of amlodipine, valsartan, HCTZ to reduce pill burden.  She is continued on Lyrica for chronic back pain and oxycodone as prescribed to her by another provider.

## 2020-09-30 NOTE — Assessment & Plan Note (Signed)
ASCVD risk 32%. Patient is on rosuvastatin 20mg  daily at bedtime. Previously unable to tolerate high intensity.   Plan: Continue rosuvastatin 20mg  daily

## 2020-09-30 NOTE — Assessment & Plan Note (Signed)
Most recent HbA1c 6.7. She is on Lantus 46T daily and Trulicity 1.5mg  weekly. Patient currently out of testing strips at home. Previously prescribed Novolog 5U tid with meals; however, patient is not taking this. Will evaluate at next visit for compliance with insulin regimen.  Plan: Continue current regimen HbA1c in 3 months

## 2020-09-30 NOTE — Progress Notes (Signed)
   CC: hypertension f/u  HPI:  Ms.Ayanna L Thune is a 64 y.o. female with PMHx as listed below presenting for follow up of her hypertension. She recently had a telehealth visit for elevated blood pressures at home for which her amlodipine dose was increased. She notes medication compliance and no new concerns. Please see problem based charting for complete assessment and plan.   Past Medical History:  Diagnosis Date  . Anxiety   . Arthritis   . Chest pain 09/26/2018  . Congenital blindness    Left  eye  . Coronary artery disease    per cardiac cath 2015-- mild disease involving LAD and branches ,  LCFx and branches, and mild disease pRCA  . Depression   . Diabetes mellitus, type II ([place])    followed by pcp  . Glaucoma   . Glaucoma, both eyes   . Hepatitis B infection pt unsure   resolved  . History of kidney stones    right stone  . History of transient ischemic attack (TIA) 2017   per pt no residuals  . HTN (hypertension)    followed by pcp  . Hyperlipidemia   . PAD (peripheral artery disease) ([place])   . Poor dental hygiene   . Stroke ([place])   . Tobacco abuse    .5 PPD smoker   Review of Systems:  Negative except as stated in HPI.  Physical Exam:  Vitals:   09/29/20 1355  BP: (!) 145/76  Pulse: 64  Temp: 98.6 F (37 C)  TempSrc: Oral  SpO2: 99%  Weight: 169 lb (76.7 kg)  Height: 5\' 7"  (1.702 m)   Physical Exam  Constitutional: Appears well-developed and well-nourished. No distress.  HENT: Normocephalic and atraumatic, EOMI, conjunctiva normal, moist mucous membranes Cardiovascular: Normal rate, regular rhythm, S1 and S2 present, no murmurs, rubs, gallops.  Distal pulses intact Respiratory: No respiratory distress, no accessory muscle use.  Effort is normal.  Lungs are clear to auscultation bilaterally. GI: Nondistended, soft, nontender to palpation, normal active bowel sounds Musculoskeletal: Normal bulk and tone.  No peripheral edema noted. Neurological: Is  alert and oriented x4, no apparent focal deficits noted. Skin: Warm and dry.  No rash, erythema, lesions noted. Psychiatric: Normal mood and affect. Behavior is normal. Judgment and thought content normal.    Assessment & Plan:   See Encounters Tab for problem based charting.  Patient discussed with Dr. Jimmye Norman

## 2020-09-30 NOTE — Progress Notes (Signed)
Internal Medicine Clinic Attending ° °Case discussed with Dr. Aslam  At the time of the visit.  We reviewed the resident’s history and exam and pertinent patient test results.  I agree with the assessment, diagnosis, and plan of care documented in the resident’s note.  °

## 2020-09-30 NOTE — Assessment & Plan Note (Addendum)
Patient requesting increasing dose of cymbalta.  She endorses anxiety and depression not well controlled with her current dose.  She denies any suicidal or homicidal ideation.  Plan -Increase Cymbalta to 60 mg daily -Continue mirtazapine 45 mg daily at bedtime -Discontinue trazodone 300 mg at this time in setting of polypharmacy

## 2020-09-30 NOTE — Assessment & Plan Note (Signed)
Patient has a history of poorly controlled hypertension and has been on multiple medications previously including Norvasc, Coreg, Cozaar, Hydralazine and Hygroton. However, question of medication compliance vs secondary hypertension. Aldo/renin ratio wnl. Medication reconciliation revealed that patient was not taking hydralazine or hygroton. She was continued on amlodipine, carvedilol, and losartan for improved BP control. At this visit, patient's BP 145/76. She notes that she is taking her medication but also notes that she is on a lot of medication which makes it difficult to keep track of everything. She would benefit from combination pill. Currently asymptomatic. Will also benefit from tighter BP control with possible addition of another agent.  Plan: Discontinue amlodipine and losartan Start amlodipine- valsartan-HCTZ 10-160-12.5mg  daily Continue carvedilol 12.5mg  twice daily BP and BMP check in 4 weeks

## 2020-10-02 NOTE — Progress Notes (Signed)
Internal Medicine Clinic Attending ° °CCM services provided by the care management provider and their documentation were discussed with Dr. Aslam. We reviewed the pertinent findings, urgent action items addressed by the resident and non-urgent items to be addressed by the PCP.  I agree with the assessment, diagnosis, and plan of care documented in the CCM and resident's note. ° °Belvie Iribe, MD °10/02/2020 ° °

## 2020-10-08 ENCOUNTER — Telehealth: Payer: Self-pay | Admitting: Student

## 2020-10-08 MED ORDER — AMLODIPINE-VALSARTAN-HCTZ 10-160-12.5 MG PO TABS: 1.0000 | ORAL_TABLET | Freq: Every day | ORAL | 0 refills | Status: DC

## 2020-10-08 NOTE — Telephone Encounter (Signed)
I called and left a message, informing Ms. Isadore that I have approved her prescription refill of her combination blood pressure medication for 60 days to hold her over until her next appointment. I informed her that her PCP would like to see her back in about 3 weeks to assess her response and gave her our [facility] clinic number to call.  Jeralyn Bennett, PGY1 Internal Medicine  [phone]

## 2020-10-08 NOTE — Addendum Note (Signed)
Addended by: [name] on: 10/08/2020 01:28 PM   Modules accepted: Orders

## 2020-10-11 ENCOUNTER — Other Ambulatory Visit: Payer: Self-pay

## 2020-10-11 ENCOUNTER — Emergency Department (HOSPITAL_COMMUNITY): Payer: Medicare Other

## 2020-10-11 ENCOUNTER — Emergency Department (HOSPITAL_COMMUNITY)
Admission: EM | Admit: 2020-10-11 | Discharge: 2020-10-11 | Disposition: A | Discharge status: Home / Self Care | Payer: Medicare Other | Attending: Emergency Medicine | Admitting: Emergency Medicine

## 2020-10-11 ENCOUNTER — Encounter (HOSPITAL_COMMUNITY): Payer: Self-pay | Admitting: Emergency Medicine

## 2020-10-11 ENCOUNTER — Ambulatory Visit (HOSPITAL_COMMUNITY)
Admission: EM | Admit: 2020-10-11 | Discharge: 2020-10-11 | Disposition: A | Discharge status: Home / Self Care | Payer: Medicare Other | Attending: Family Medicine | Admitting: Family Medicine

## 2020-10-11 ENCOUNTER — Encounter (HOSPITAL_COMMUNITY): Payer: Self-pay

## 2020-10-11 DIAGNOSIS — Z79899 Other long term (current) drug therapy: Secondary | ICD-10-CM | POA: Insufficient documentation

## 2020-10-11 DIAGNOSIS — R739 Hyperglycemia, unspecified: Secondary | ICD-10-CM | POA: Diagnosis not present

## 2020-10-11 DIAGNOSIS — E1110 Type 2 diabetes mellitus with ketoacidosis without coma: Secondary | ICD-10-CM | POA: Diagnosis not present

## 2020-10-11 DIAGNOSIS — Z794 Long term (current) use of insulin: Secondary | ICD-10-CM | POA: Insufficient documentation

## 2020-10-11 DIAGNOSIS — Z951 Presence of aortocoronary bypass graft: Secondary | ICD-10-CM | POA: Diagnosis not present

## 2020-10-11 DIAGNOSIS — F17210 Nicotine dependence, cigarettes, uncomplicated: Secondary | ICD-10-CM | POA: Diagnosis not present

## 2020-10-11 DIAGNOSIS — I10 Essential (primary) hypertension: Secondary | ICD-10-CM

## 2020-10-11 DIAGNOSIS — Z7982 Long term (current) use of aspirin: Secondary | ICD-10-CM | POA: Diagnosis not present

## 2020-10-11 DIAGNOSIS — I2510 Atherosclerotic heart disease of native coronary artery without angina pectoris: Secondary | ICD-10-CM | POA: Insufficient documentation

## 2020-10-11 DIAGNOSIS — E1165 Type 2 diabetes mellitus with hyperglycemia: Secondary | ICD-10-CM | POA: Insufficient documentation

## 2020-10-11 DIAGNOSIS — R531 Weakness: Secondary | ICD-10-CM | POA: Diagnosis not present

## 2020-10-11 LAB — CBC
HCT: 43.6 % (ref 36.0–46.0)
Hemoglobin: 13.5 g/dL (ref 12.0–15.0)
MCH: 22.4 pg — ABNORMAL LOW (ref 26.0–34.0)
MCHC: 31 g/dL (ref 30.0–36.0)
MCV: 72.2 fL — ABNORMAL LOW (ref 80.0–100.0)
Platelets: 231 10*3/uL (ref 150–400)
RBC: 6.04 MIL/uL — ABNORMAL HIGH (ref 3.87–5.11)
RDW: 13.5 % (ref 11.5–15.5)
WBC: 8.1 10*3/uL (ref 4.0–10.5)
nRBC: 0 % (ref 0.0–0.2)

## 2020-10-11 LAB — POCT URINALYSIS DIPSTICK, ED / UC
Bilirubin Urine: NEGATIVE
Glucose, UA: 500 mg/dL — AB
Hgb urine dipstick: NEGATIVE
Ketones, ur: 15 mg/dL — AB
Leukocytes,Ua: NEGATIVE
Nitrite: NEGATIVE
Protein, ur: NEGATIVE mg/dL
Specific Gravity, Urine: 1.01 (ref 1.005–1.030)
Urobilinogen, UA: 0.2 mg/dL (ref 0.0–1.0)
pH: 5 (ref 5.0–8.0)

## 2020-10-11 LAB — CBG MONITORING, ED
Glucose-Capillary: 518 mg/dL (ref 70–99)
Glucose-Capillary: 489 mg/dL — ABNORMAL HIGH (ref 70–99)
Glucose-Capillary: 401 mg/dL — ABNORMAL HIGH (ref 70–99)
Glucose-Capillary: 471 mg/dL — ABNORMAL HIGH (ref 70–99)
Glucose-Capillary: 311 mg/dL — ABNORMAL HIGH (ref 70–99)

## 2020-10-11 LAB — BASIC METABOLIC PANEL
Anion gap: 12 (ref 5–15)
BUN: 15 mg/dL (ref 8–23)
CO2: 24 mmol/L (ref 22–32)
Calcium: 10.5 mg/dL — ABNORMAL HIGH (ref 8.9–10.3)
Chloride: 93 mmol/L — ABNORMAL LOW (ref 98–111)
Creatinine, Ser: 1.11 mg/dL — ABNORMAL HIGH (ref 0.44–1.00)
GFR, Estimated: 52 mL/min — ABNORMAL LOW (ref >60)
Glucose, Bld: 502 mg/dL (ref 70–99)
Potassium: 4.2 mmol/L (ref 3.5–5.1)
Sodium: 129 mmol/L — ABNORMAL LOW (ref 135–145)

## 2020-10-11 LAB — BLOOD GAS, VENOUS
Acid-Base Excess: 2.1 mmol/L — ABNORMAL HIGH (ref 0.0–2.0)
Bicarbonate: 26.3 mmol/L (ref 20.0–28.0)
O2 Saturation: 90.1 %
Patient temperature: 98.6
pCO2, Ven: 41.4 mmHg — ABNORMAL LOW (ref 44.0–60.0)
pH, Ven: 7.419 (ref 7.250–7.430)
pO2, Ven: 56.3 mmHg — ABNORMAL HIGH (ref 32.0–45.0)

## 2020-10-11 LAB — BETA-HYDROXYBUTYRIC ACID: Beta-Hydroxybutyric Acid: 0.82 mmol/L — ABNORMAL HIGH (ref 0.05–0.27)

## 2020-10-11 MED ORDER — LACTATED RINGERS IV SOLN: INTRAVENOUS | Status: DC

## 2020-10-11 MED ORDER — DEXTROSE IN LACTATED RINGERS 5 % IV SOLN: INTRAVENOUS | Status: DC

## 2020-10-11 MED ORDER — SODIUM CHLORIDE 0.9 % IV BOLUS
1000.0000 mL | Freq: Once | INTRAVENOUS | Status: AC
  Administered 2020-10-11: 1000 mL via INTRAVENOUS

## 2020-10-11 MED ORDER — DEXTROSE 50 % IV SOLN: 0.0000 mL | INTRAVENOUS | Status: DC | PRN

## 2020-10-11 MED ORDER — INSULIN REGULAR(HUMAN) IN NACL 100-0.9 UT/100ML-% IV SOLN
INTRAVENOUS | Status: DC
  Administered 2020-10-11: 9 [IU]/h via INTRAVENOUS
  Filled 2020-10-11: qty 100

## 2020-10-11 MED ORDER — LACTATED RINGERS IV BOLUS: 500.0000 mL | Freq: Once | INTRAVENOUS | Status: DC

## 2020-10-11 NOTE — Discharge Instructions (Addendum)
Due to the level of your glucose and the ketones in your urine you are experiencing a syndrome called DKA diabetic ketoacidosis.  This requires management in the setting of the emergency department.  Go immediately to [facility] or [facility] ER for further evaluation and management.

## 2020-10-11 NOTE — ED Provider Notes (Signed)
[place]    CSN: [id number] Arrival date & time: 10/11/20  1238      History   Chief Complaint Chief Complaint  Patient presents with  . Hypertension  . Blood Sugar Problem    HPI Susan Holmes is a 64 y.o. female.   HPI  Patient with a history of uncontrolled diabetes reports over 3 days of having unreadable glucose readings at home.  She endorses feeling nauseous, urinating frequently, and feeling fatigued.  Patient denies any changes in vision just overall endorses feeling very tired.  She denies any chest pain, shortness of breath, or new weakness.  She is tolerating water and has been drinking lots of water throughout the day today.  Past Medical History:  Diagnosis Date  . Anxiety   . Arthritis   . Chest pain 09/26/2018  . Congenital blindness    Left  eye  . Coronary artery disease    per cardiac cath 2015-- mild disease involving LAD and branches ,  LCFx and branches, and mild disease pRCA  . Depression   . Diabetes mellitus, type II ([place])    followed by pcp  . Glaucoma   . Glaucoma, both eyes   . Hepatitis B infection pt unsure   resolved  . History of kidney stones    right stone  . History of transient ischemic attack (TIA) 2017   per pt no residuals  . HTN (hypertension)    followed by pcp  . Hyperlipidemia   . PAD (peripheral artery disease) ([place])   . Poor dental hygiene   . Stroke ([place])   . Tobacco abuse    .5 PPD smoker    Patient Active Problem List   Diagnosis Date Noted  . Polypharmacy 09/09/2020  . Neck pain on left side 12/17/2019  . PAD (peripheral artery disease) ([place]) 10/22/2019  . Sinus congestion 10/11/2019  . PVD (peripheral vascular disease) ([place]) 10/04/2019  . Insulin dependent type 2 diabetes mellitus ([place]) 10/04/2019  . Claudication ([place]) 09/14/2019  . Left foot pain 07/12/2019  . Paronychia of great toe, left 06/21/2019  . Diaphoresis 02/15/2019  . Nephrolithiasis 10/03/2018  . Left chest pressure  09/26/2018  . Upper back pain on right side 09/19/2018  . Callus of foot 09/19/2018  . Reactive airway disease 06/20/2018  . Unintentional weight loss 06/14/2018  . History of vitamin D deficiency 09/06/2017  . Forgetfulness 06/07/2017  . Dyspepsia 04/26/2017  . History of CVA (cerebrovascular accident) 07/23/2016  . Cough 07/08/2016  . Lower back pain 08/06/2015  . Osteoarthritis 12/11/2014  . History of chest pain 10/07/2014  . Pre-operative cardiovascular examination 10/07/2014  . Diarrhea 04/04/2014  . Liver hemangioma 04/04/2014  . RBC microcytosis 02/05/2014  . Hyperparathyroidism ([place]) 03/08/2013  . Calcific tendinitis of right shoulder 02/26/2012  . Fatigue 12/14/2011  . Uncontrolled type II diabetes mellitus ([place]) 06/12/2007  . Hyperlipidemia associated with type 2 diabetes mellitus ([place]) 06/12/2007  . ANXIETY 06/12/2007  . TOBACCO ABUSE 06/12/2007  . Depression 06/12/2007  . CONGENITAL BLINDNESS, LEFT EYE 06/12/2007  . Essential hypertension 06/12/2007    Past Surgical History:  Procedure Laterality Date  . ABDOMINAL AORTOGRAM W/LOWER EXTREMITY N/A 09/14/2019   Procedure: ABDOMINAL AORTOGRAM W/LOWER EXTREMITY;  Surgeon: Elam Dutch, MD;  Location: [facility];  Service: Cardiovascular;  Laterality: N/A;  . ABDOMINAL HYSTERECTOMY  1980s   unsure if ovaries were taken  . BYPASS GRAFT Left 10/22/2019    Left femoral to above-knee  popliteal bypass using reversed ipsilateral greater saphenous vein  . colonoscopy    . FEMORAL-POPLITEAL BYPASS GRAFT Left 10/22/2019   Procedure: LEFT FEMORAL-ABOVE KNEE POPLITEAL  BYPASS GRAFT with REVERSED GREATER SAPHENOUS VEIN;  Surgeon: Elam Dutch, MD;  Location: [facility];  Service: Vascular;  Laterality: Left;  . GLAUCOMA SURGERY Right 2017  . IR URETERAL STENT RIGHT NEW ACCESS W/O SEP NEPHROSTOMY CATH  10/03/2018  . LEFT HEART CATHETERIZATION WITH CORONARY ANGIOGRAM N/A 10/09/2014   Procedure: LEFT HEART  CATHETERIZATION WITH CORONARY ANGIOGRAM;  Surgeon: Troy Sine, MD;  Location[facility] C[facility];  Service: Cardiovascular;  Laterality: N/A;  . NEPHROLITHOTOMY Right 10/03/2018   Procedure: RIGHT NEPHROLITHOTOMY PERCUTANEOUS FIRST STAGE;  Surgeon: Irine Seal, MD;  Location: [facility];  Service: Urology;  Laterality: Right;  . PERIPHERAL VASCULAR INTERVENTION Left 09/14/2019   Procedure: PERIPHERAL VASCULAR INTERVENTION;  Surgeon: Elam Dutch, MD;  Location: [facility];  Service: Cardiovascular;  Laterality: Left;   left external iliac stent   . TEE WITHOUT CARDIOVERSION N/A 08/17/2016   Procedure: TRANSESOPHAGEAL ECHOCARDIOGRAM (TEE);  Surgeon: Sueanne Margarita, MD;  Location: [facility] ENDOSCOPY;  Service: Cardiovascular;  Laterality: N/A;    OB History   No obstetric history on file.      Home Medications    Prior to Admission medications   Medication Sig Start Date End Date Taking? Authorizing Provider  allopurinol (ZYLOPRIM) 100 MG tablet Take 1 tablet (100 mg total) by mouth daily. 09/29/20   [name], MD  amLODIPine-Valsartan-HCTZ 10-160-12.5 MG TABS Take 1 tablet by mouth daily. 10/08/20 01/06/21  [name], MD  aspirin EC 81 MG tablet Take 1 tablet (81 mg total) by mouth daily. 09/29/20   [name], MD  Blood Glucose Monitoring Suppl (ACCU-CHEK GUIDE ME) w/Device KIT Check blood sugar 3 times a day 08/18/20   [name], MD  carvedilol (COREG) 12.5 MG tablet TAKE 1 TABLET(12.5 MG) BY MOUTH TWICE DAILY 09/29/20   [name], MD  diclofenac Sodium (VOLTAREN) 1 % GEL  02/17/20   [provider]  dorzolamide-timolol (COSOPT) 22.3-6.8 MG/ML ophthalmic solution Place 1 drop into both eyes every evening. 09/29/20   [name], MD  Dulaglutide (TRULICITY) 1.5 GU/5.4YH SOPN ADMINISTER 1.5 MG UNDER THE SKIN EVERY SATURDAY 09/29/20   Aslam, Loralyn Freshwater, MD  DULoxetine (CYMBALTA) 30 MG capsule TAKE 2 CAPSULE(60 MG) BY MOUTH DAILY 09/29/20   Aslam, Loralyn Freshwater, MD  fluticasone (FLONASE) 50  MCG/ACT nasal spray Place 1 spray into both nostrils daily. 05/14/20 08/12/20  [name], MD  glucose blood (ACCU-CHEK GUIDE) test strip Check blood sugar 3 times per day 09/29/20   [name], MD  insulin glargine (LANTUS SOLOSTAR) 100 UNIT/ML Solostar Pen Inject 28 Units into the skin at bedtime. 09/29/20   [name], MD  Insulin Pen Needle (VALUMARK PEN NEEDLES) 31G X 8 MM MISC 1 each by Does not apply route daily. 09/29/20   [name], MD  Lancets (ACCU-CHEK SOFT TOUCH) lancets Check blood sugar 3 times a day 09/29/20   [name], MD  lidocaine (XYLOCAINE) 5 % ointment APPLY TOPICALLY TO THE AFFECTED AREA EVERY NIGHT FOR RIGHT SHOULDER PAIN 05/13/20   [provider]  mirtazapine (REMERON) 45 MG tablet Take 1 tablet (45 mg total) by mouth at bedtime. 09/29/20   [name], MD  Multiple Vitamin (MULTIVITAMIN WITH MINERALS) TABS tablet Take 1 tablet by mouth daily.    [provider]  NARCAN 4 MG/0.1ML LIQD nasal spray kit SPRAY  AS DIRECTED 04/29/20   [provider]  oxyCODONE (ROXICODONE) 15 MG immediate release tablet Take 15 mg by mouth 4 (four) times daily as needed. 09/10/20   [provider]  polyethylene glycol powder (GLYCOLAX/MIRALAX) 17 GM/SCOOP powder SMARTSIG:1 Scoopful By Mouth 4 Times Daily 09/29/20   [name], MD  prednisoLONE acetate (PRED FORTE) 1 % ophthalmic suspension Place 1 drop into the right eye daily. 09/29/20   [name], MD  pregabalin (LYRICA) 100 MG capsule Take 1 capsule (100 mg total) by mouth 2 (two) times daily. 09/29/20 12/28/20  [name], MD  rosuvastatin (CRESTOR) 20 MG tablet Take 1 tablet (20 mg total) by mouth at bedtime. NEED OV. 09/29/20   [name], MD    Family History Family History  Problem Relation Age of Onset  . Heart disease Mother 82  . Hypertension Mother   . CVA Mother   . Heart disease Father 41  . CVA Sister   . Heart disease Sister   . Aneurysm Sister   . CVA Brother   . Aneurysm  Brother   . CVA Maternal Grandmother   . CVA Maternal Grandfather   . Hypertension Brother   . Colon cancer Neg Hx   . Esophageal cancer Neg Hx   . Stomach cancer Neg Hx     Social History Social History   Tobacco Use  . Smoking status: Current Every Day Smoker    Packs/day: 0.50    Years: 39.00    Pack years: 19.50    Types: Cigarettes    Start date: 11  . Smokeless tobacco: Current User    Types: Chew  . Tobacco comment: 6 cigs per day  Vaping Use  . Vaping Use: Never used  Substance Use Topics  . Alcohol use: No    Alcohol/week: 0.0 standard drinks  . Drug use: No     Allergies   Ace inhibitors, Lipitor [atorvastatin], and Chantix [varenicline] Review of Systems Review of Systems Pertinent negatives listed in HPI Physical Exam Triage Vital Signs ED Triage Vitals  Enc Vitals Group     BP 10/11/20 1402 (!) 149/89     Pulse Rate 10/11/20 1402 84     Resp 10/11/20 1402 16     Temp 10/11/20 1402 98 F (36.7 C)     Temp Source 10/11/20 1402 Oral     SpO2 10/11/20 1402 99 %     Weight --      Height --      Head Circumference --      Peak Flow --      Pain Score 10/11/20 1400 6     Pain Loc --      Pain Edu? --      Excl. in [place]? --    No data found.  Updated Vital Signs BP (!) 149/89 (BP Location: Right Arm)   Pulse 84   Temp 98 F (36.7 C) (Oral)   Resp 16   SpO2 99%   Visual Acuity Right Eye Distance:   Left Eye Distance:   Bilateral Distance:    Right Eye Near:   Left Eye Near:    Bilateral Near:     Physical Exam Constitutional:      Appearance: She is ill-appearing.  HENT:     Head: Normocephalic.  Cardiovascular:     Rate and Rhythm: Normal rate and regular rhythm.  Pulmonary:     Comments: Increased work of breathing.  No wheezing , crackles, rhonchi noted  on exam. Neurological:     GCS: GCS eye subscore is 4. GCS verbal subscore is 5. GCS motor subscore is 6.  Psychiatric:        Attention and Perception: Attention and  perception normal.      UC Treatments / Results  Labs (all labs ordered are listed, but only abnormal results are displayed) Labs Reviewed  CBG MONITORING, ED - Abnormal; Notable for the following components:      Result Value   Glucose-Capillary 518 (*)    All other components within normal limits  POCT URINALYSIS DIPSTICK, ED / UC - Abnormal; Notable for the following components:   Glucose, UA 500 (*)    Ketones, ur 15 (*)    All other components within normal limits    EKG   Radiology No results found.  Procedures Procedures (including critical care time)  Medications Ordered in UC Medications - No data to display  Initial Impression / Assessment and Plan / UC Course  I have reviewed the triage vital signs and the nursing notes.  Pertinent labs & imaging results that were available during my care of the patient were reviewed by me and considered in my medical decision making (see chart for details).    Patient has large ketones in her urine and blood sugar is greater than 500 patient is being directed to the ER for management of DKA.  Patient endorses some current weakness she is with family who has agreed to transfer her to either [facility] or [facility], depending on wait times. Final Clinical Impressions(s) / UC Diagnoses   Final diagnoses:  Diabetic ketoacidosis without coma associated with type 2 diabetes mellitus ([place])     Discharge Instructions     Due to the level of your glucose and the ketones in your urine you are experiencing a syndrome called DKA diabetic ketoacidosis.  This requires management in the setting of the emergency department.  Go immediately to [facility] or [facility] ER for further evaluation and management.   ED Prescriptions    None     PDMP not reviewed this encounter.   Scot Jun, FNP 10/11/20 1525

## 2020-10-11 NOTE — ED Provider Notes (Signed)
[facility] DEPT Provider Note   CSN: [id number] Arrival date & time: 10/11/20  1544     History Chief Complaint  Patient presents with  . Hypertension  . Hyperglycemia    [name] is a 64 y.o. female.  HPI  64 yo female ho t2dm presents today from ucc with report of hyperglycemia. Patient states her bs has been high all week.  She reports constipation and has not been feeling well- feels dry, shaky headed.  States drinking fluids, but poor appetite and not eating well. Patient states she has had a fever this week she has had subjectiv fever and chills, but has not taken her temp.  Patient with nasal congestion , cough np, no vomiting or diarrhea.  Patient has not had covid.  Vaccinated in March, no booster.  Swab on Tuesday from nurse who comes on Tuesday Covid negative.  Patient lives alone and has low vision.      Past Medical History:  Diagnosis Date  . Anxiety   . Arthritis   . Chest pain 09/26/2018  . Congenital blindness    Left  eye  . Coronary artery disease    per cardiac cath 2015-- mild disease involving LAD and branches ,  LCFx and branches, and mild disease pRCA  . Depression   . Diabetes mellitus, type II ([place])    followed by pcp  . Glaucoma   . Glaucoma, both eyes   . Hepatitis B infection pt unsure   resolved  . History of kidney stones    right stone  . History of transient ischemic attack (TIA) 2017   per pt no residuals  . HTN (hypertension)    followed by pcp  . Hyperlipidemia   . PAD (peripheral artery disease) ([place])   . Poor dental hygiene   . Stroke ([place])   . Tobacco abuse    .5 PPD smoker    Patient Active Problem List   Diagnosis Date Noted  . Polypharmacy 09/09/2020  . Neck pain on left side 12/17/2019  . PAD (peripheral artery disease) ([place]) 10/22/2019  . Sinus congestion 10/11/2019  . PVD (peripheral vascular disease) ([place]) 10/04/2019  . Insulin dependent type 2 diabetes mellitus ([place])  10/04/2019  . Claudication ([place]) 09/14/2019  . Left foot pain 07/12/2019  . Paronychia of great toe, left 06/21/2019  . Diaphoresis 02/15/2019  . Nephrolithiasis 10/03/2018  . Left chest pressure 09/26/2018  . Upper back pain on right side 09/19/2018  . Callus of foot 09/19/2018  . Reactive airway disease 06/20/2018  . Unintentional weight loss 06/14/2018  . History of vitamin D deficiency 09/06/2017  . Forgetfulness 06/07/2017  . Dyspepsia 04/26/2017  . History of CVA (cerebrovascular accident) 07/23/2016  . Cough 07/08/2016  . Lower back pain 08/06/2015  . Osteoarthritis 12/11/2014  . History of chest pain 10/07/2014  . Pre-operative cardiovascular examination 10/07/2014  . Diarrhea 04/04/2014  . Liver hemangioma 04/04/2014  . RBC microcytosis 02/05/2014  . Hyperparathyroidism ([place]) 03/08/2013  . Calcific tendinitis of right shoulder 02/26/2012  . Fatigue 12/14/2011  . Uncontrolled type II diabetes mellitus ([place]) 06/12/2007  . Hyperlipidemia associated with type 2 diabetes mellitus ([place]) 06/12/2007  . ANXIETY 06/12/2007  . TOBACCO ABUSE 06/12/2007  . Depression 06/12/2007  . CONGENITAL BLINDNESS, LEFT EYE 06/12/2007  . Essential hypertension 06/12/2007    Past Surgical History:  Procedure Laterality Date  . ABDOMINAL AORTOGRAM W/LOWER EXTREMITY N/A 09/14/2019   Procedure: ABDOMINAL AORTOGRAM W/LOWER EXTREMITY;  Surgeon: Oneida Alar,  Jessy Oto, MD;  Location: [facility];  Service: Cardiovascular;  Laterality: N/A;  . ABDOMINAL HYSTERECTOMY  1980s   unsure if ovaries were taken  . BYPASS GRAFT Left 10/22/2019    Left femoral to above-knee popliteal bypass using reversed ipsilateral greater saphenous vein  . colonoscopy    . FEMORAL-POPLITEAL BYPASS GRAFT Left 10/22/2019   Procedure: LEFT FEMORAL-ABOVE KNEE POPLITEAL  BYPASS GRAFT with REVERSED GREATER SAPHENOUS VEIN;  Surgeon: Elam Dutch, MD;  Location: [facility];  Service: Vascular;  Laterality: Left;  . GLAUCOMA  SURGERY Right 2017  . IR URETERAL STENT RIGHT NEW ACCESS W/O SEP NEPHROSTOMY CATH  10/03/2018  . LEFT HEART CATHETERIZATION WITH CORONARY ANGIOGRAM N/A 10/09/2014   Procedure: LEFT HEART CATHETERIZATION WITH CORONARY ANGIOGRAM;  Surgeon: Troy Sine, MD;  Location: [facility] CATH LAB;  Service: Cardiovascular;  Laterality: N/A;  . NEPHROLITHOTOMY Right 10/03/2018   Procedure: RIGHT NEPHROLITHOTOMY PERCUTANEOUS FIRST STAGE;  Surgeon: Irine Seal, MD;  Location: [facility];  Service: Urology;  Laterality: Right;  . PERIPHERAL VASCULAR INTERVENTION Left 09/14/2019   Procedure: PERIPHERAL VASCULAR INTERVENTION;  Surgeon: Elam Dutch, MD;  Location: [facility];  Service: Cardiovascular;  Laterality: Left;   left external iliac stent   . TEE WITHOUT CARDIOVERSION N/A 08/17/2016   Procedure: TRANSESOPHAGEAL ECHOCARDIOGRAM (TEE);  Surgeon: Sueanne Margarita, MD;  Location: [facility] ENDOSCOPY;  Service: Cardiovascular;  Laterality: N/A;     OB History   No obstetric history on file.     Family History  Problem Relation Age of Onset  . Heart disease Mother 80  . Hypertension Mother   . CVA Mother   . Heart disease Father 5  . CVA Sister   . Heart disease Sister   . Aneurysm Sister   . CVA Brother   . Aneurysm Brother   . CVA Maternal Grandmother   . CVA Maternal Grandfather   . Hypertension Brother   . Colon cancer Neg Hx   . Esophageal cancer Neg Hx   . Stomach cancer Neg Hx     Social History   Tobacco Use  . Smoking status: Current Every Day Smoker    Packs/day: 0.50    Years: 39.00    Pack years: 19.50    Types: Cigarettes    Start date: 56  . Smokeless tobacco: Current User    Types: Chew  . Tobacco comment: 6 cigs per day  Vaping Use  . Vaping Use: Never used  Substance Use Topics  . Alcohol use: No    Alcohol/week: 0.0 standard drinks  . Drug use: No    Home Medications Prior to Admission medications   Medication Sig Start Date End Date Taking? Authorizing Provider   allopurinol (ZYLOPRIM) 100 MG tablet Take 1 tablet (100 mg total) by mouth daily. 09/29/20   [name], MD  amLODIPine-Valsartan-HCTZ 10-160-12.5 MG TABS Take 1 tablet by mouth daily. 10/08/20 01/06/21  [name], MD  aspirin EC 81 MG tablet Take 1 tablet (81 mg total) by mouth daily. 09/29/20   [name], MD  Blood Glucose Monitoring Suppl (ACCU-CHEK GUIDE ME) w/Device KIT Check blood sugar 3 times a day 08/18/20   [name], MD  carvedilol (COREG) 12.5 MG tablet TAKE 1 TABLET(12.5 MG) BY MOUTH TWICE DAILY 09/29/20   [name], MD  diclofenac Sodium (VOLTAREN) 1 % GEL  02/17/20   [provider]  dorzolamide-timolol (COSOPT) 22.3-6.8 MG/ML ophthalmic solution Place 1 drop into both eyes every evening.  09/29/20   [name], MD  Dulaglutide (TRULICITY) 1.5 NO/6.7EH SOPN ADMINISTER 1.5 MG UNDER THE SKIN EVERY SATURDAY 09/29/20   Aslam, Loralyn Freshwater, MD  DULoxetine (CYMBALTA) 30 MG capsule TAKE 2 CAPSULE(60 MG) BY MOUTH DAILY 09/29/20   Aslam, Loralyn Freshwater, MD  fluticasone (FLONASE) 50 MCG/ACT nasal spray Place 1 spray into both nostrils daily. 05/14/20 08/12/20  [name], MD  glucose blood (ACCU-CHEK GUIDE) test strip Check blood sugar 3 times per day 09/29/20   [name], MD  insulin glargine (LANTUS SOLOSTAR) 100 UNIT/ML Solostar Pen Inject 28 Units into the skin at bedtime. 09/29/20   [name], MD  Insulin Pen Needle (VALUMARK PEN NEEDLES) 31G X 8 MM MISC 1 each by Does not apply route daily. 09/29/20   [name], MD  Lancets (ACCU-CHEK SOFT TOUCH) lancets Check blood sugar 3 times a day 09/29/20   [name], MD  lidocaine (XYLOCAINE) 5 % ointment APPLY TOPICALLY TO THE AFFECTED AREA EVERY NIGHT FOR RIGHT SHOULDER PAIN 05/13/20   [provider]  mirtazapine (REMERON) 45 MG tablet Take 1 tablet (45 mg total) by mouth at bedtime. 09/29/20   [name], MD  Multiple Vitamin (MULTIVITAMIN WITH MINERALS) TABS tablet Take 1 tablet by mouth daily.    [provider]   NARCAN 4 MG/0.1ML LIQD nasal spray kit SPRAY AS DIRECTED 04/29/20   [provider]  oxyCODONE (ROXICODONE) 15 MG immediate release tablet Take 15 mg by mouth 4 (four) times daily as needed. 09/10/20   [provider]  polyethylene glycol powder (GLYCOLAX/MIRALAX) 17 GM/SCOOP powder SMARTSIG:1 Scoopful By Mouth 4 Times Daily 09/29/20   [name], MD  prednisoLONE acetate (PRED FORTE) 1 % ophthalmic suspension Place 1 drop into the right eye daily. 09/29/20   [name], MD  pregabalin (LYRICA) 100 MG capsule Take 1 capsule (100 mg total) by mouth 2 (two) times daily. 09/29/20 12/28/20  [name], MD  rosuvastatin (CRESTOR) 20 MG tablet Take 1 tablet (20 mg total) by mouth at bedtime. NEED OV. 09/29/20   [name], MD    Allergies    Ace inhibitors, Lipitor [atorvastatin], and Chantix [varenicline]  Review of Systems   Review of Systems  Constitutional: Positive for appetite change, chills, diaphoresis, fatigue and fever. Negative for unexpected weight change.  HENT: Positive for congestion and sinus pressure. Negative for sinus pain, sneezing and sore throat.   Eyes: Positive for pain.       Right eye pain ongoing from glaucoma  Respiratory: Positive for cough. Negative for shortness of breath and wheezing.   Cardiovascular: Negative.   Gastrointestinal: Negative.   Endocrine: Negative.   Genitourinary: Negative.   Musculoskeletal: Negative.   Skin: Negative.   Neurological: Negative.   Hematological: Negative.   Psychiatric/Behavioral: Negative.   All other systems reviewed and are negative.   Physical Exam Updated Vital Signs BP (!) 155/100 (BP Location: Left Arm)   Pulse 73   Temp 98.8 F (37.1 C) (Oral)   Resp 17   Ht 1.702 m (_0 )   Wt 76.7 kg   SpO2 95%   BMI 26.47 kg/m   Physical Exam Vitals and nursing note reviewed.  Constitutional:      Appearance: Normal appearance. She is normal weight.  HENT:     Head: Normocephalic.     Right Ear:  External ear normal.     Left Ear: External ear normal.     Nose: Nose normal.     Mouth/Throat:  Mouth: Mucous membranes are moist.  Eyes:     Extraocular Movements: Extraocular movements intact.  Cardiovascular:     Rate and Rhythm: Normal rate and regular rhythm.     Pulses: Normal pulses.     Heart sounds: Normal heart sounds.  Pulmonary:     Effort: Pulmonary effort is normal.     Breath sounds: Normal breath sounds.  Abdominal:     General: Abdomen is flat.     Palpations: Abdomen is soft.  Musculoskeletal:        General: Normal range of motion.     Cervical back: Normal range of motion.  Skin:    General: Skin is warm and dry.     Capillary Refill: Capillary refill takes less than 2 seconds.  Neurological:     General: No focal deficit present.     Mental Status: She is alert and oriented to person, place, and time.  Psychiatric:        Mood and Affect: Mood normal.        Behavior: Behavior normal.     ED Results / Procedures / Treatments   Labs (all labs ordered are listed, but only abnormal results are displayed) Labs Reviewed  BASIC METABOLIC PANEL - Abnormal; Notable for the following components:      Result Value   Sodium 129 (*)    Chloride 93 (*)    Glucose, Bld 502 (*)    Creatinine, Ser 1.11 (*)    Calcium 10.5 (*)    GFR, Estimated 52 (*)    All other components within normal limits  CBC - Abnormal; Notable for the following components:   RBC 6.04 (*)    MCV 72.2 (*)    MCH 22.4 (*)    All other components within normal limits  CBG MONITORING, ED - Abnormal; Notable for the following components:   Glucose-Capillary 489 (*)    All other components within normal limits  URINALYSIS, ROUTINE W REFLEX MICROSCOPIC  BETA-HYDROXYBUTYRIC ACID  CBG MONITORING, ED  CBG MONITORING, ED    EKG None  Radiology No results found.  Procedures Procedures (including critical care time)  Medications Ordered in ED Medications  sodium chloride 0.9  % bolus 1,000 mL (has no administration in time range)  insulin regular, human (MYXREDLIN) 100 units/ 100 mL infusion (has no administration in time range)  lactated ringers infusion (has no administration in time range)  dextrose 5 % in lactated ringers infusion (has no administration in time range)  dextrose 50 % solution 0-50 mL (has no administration in time range)  lactated ringers bolus 500 mL (has no administration in time range)    ED Course  I have reviewed the triage vital signs and the nursing notes.  Pertinent labs & imaging results that were available during my care of the patient were reviewed by me and considered in my medical decision making (see chart for details).  Clinical Course as of Oct 11 1917  Sat Oct 11, 2020  1917 All labs personally reviewed and interpreted   [DR]    Clinical Course User Index [DR] Pattricia Boss, MD   MDM Rules/Calculators/A&P                          64 yo female ho t2dm with elevated bs.  NO evidence of dka or other acute complications.  Patient received iv fluid and insulin and bs decreased with last 311.  Plan continue home meds,  diet modification, f/u at pmd on Monday.  Final Clinical Impression(s) / ED Diagnoses Final diagnoses:  Weakness  Hyperglycemia    Rx / DC Orders ED Discharge Orders    None       Pattricia Boss, MD 10/11/20 2030

## 2020-10-11 NOTE — Discharge Instructions (Addendum)
Continue your home medications. Please call your doctor on Monday for recheck and medication adjustments.

## 2020-10-11 NOTE — ED Triage Notes (Signed)
Pt presents with high blood sugar, frequent urination, and high blood pressure. States started 4 days ago.

## 2020-10-11 NOTE — ED Notes (Signed)
Patient is being discharged from the Urgent Care and sent to the Emergency Department via POV with family memeber . Per Lavell Anchors, NP, patient is in need of higher level of care due to increased blood sugar. Patient is aware and verbalizes understanding of plan of care.  Vitals:   10/11/20 1402  BP: (!) 149/89  Pulse: 84  Resp: 16  Temp: 98 F (36.7 C)  SpO2: 99%

## 2020-10-11 NOTE — ED Triage Notes (Addendum)
Patient states that she went to UC and her CBG was 518. Patient was told to come to the ED. Patient is also c/o hypertension.(155/100) and abdominal pain x 3-4 days.  CBG in triage-489.

## 2020-10-13 ENCOUNTER — Telehealth: Payer: Self-pay | Admitting: *Deleted

## 2020-10-13 ENCOUNTER — Telehealth: Payer: Medicare Other

## 2020-10-13 NOTE — Telephone Encounter (Addendum)
  Chronic Care Management   Outreach Note  10/13/2020 Name: Susan Holmes MRN: [id number] DOB: August 01, 1956  Referred by: [name], MD Reason for referral : Chronic Care Management (IDDM, HTN, HLD,  PVD, left knee and leg pain)   An unsuccessful telephone outreach was attempted today. Received notification via electronic medical record that patient was at [facility] on 10/16 with high glucose and ketones in urine so she was told to seek treatment at the emergency room. Patient was treated at [facility] Emergency Department of symptoms of DKA and then released The patient was referred to the case management team for assistance with care management and care coordination.  Unable to leave message on patient's mobile and home number. Per chart review, patient has follow up clinic appointment on 10/14/20 at 3:15 pm.  Follow Up Plan: The care management team will reach out to the patient again over the next 7 days.   Kelli Churn RN, CCM, [facility] RN Care Manager [phone]

## 2020-10-14 ENCOUNTER — Encounter (HOSPITAL_COMMUNITY): Payer: Self-pay | Admitting: Emergency Medicine

## 2020-10-14 ENCOUNTER — Emergency Department (HOSPITAL_COMMUNITY)
Admission: EM | Admit: 2020-10-14 | Discharge: 2020-10-15 | Disposition: A | Discharge status: Home / Self Care | Payer: Medicare Other | Attending: Emergency Medicine | Admitting: Emergency Medicine

## 2020-10-14 ENCOUNTER — Other Ambulatory Visit: Payer: Self-pay

## 2020-10-14 ENCOUNTER — Encounter: Payer: Self-pay | Admitting: Internal Medicine

## 2020-10-14 ENCOUNTER — Ambulatory Visit (INDEPENDENT_AMBULATORY_CARE_PROVIDER_SITE_OTHER): Payer: Medicare Other | Admitting: Internal Medicine

## 2020-10-14 VITALS — BP 149/94 | HR 81 | Temp 99.3°F | Ht 67.0 in | Wt 162.2 lb

## 2020-10-14 DIAGNOSIS — E119 Type 2 diabetes mellitus without complications: Secondary | ICD-10-CM | POA: Diagnosis not present

## 2020-10-14 DIAGNOSIS — Z794 Long term (current) use of insulin: Secondary | ICD-10-CM

## 2020-10-14 DIAGNOSIS — R739 Hyperglycemia, unspecified: Secondary | ICD-10-CM

## 2020-10-14 DIAGNOSIS — E1165 Type 2 diabetes mellitus with hyperglycemia: Secondary | ICD-10-CM | POA: Diagnosis not present

## 2020-10-14 DIAGNOSIS — Z7951 Long term (current) use of inhaled steroids: Secondary | ICD-10-CM | POA: Diagnosis not present

## 2020-10-14 DIAGNOSIS — F17210 Nicotine dependence, cigarettes, uncomplicated: Secondary | ICD-10-CM | POA: Insufficient documentation

## 2020-10-14 DIAGNOSIS — I10 Essential (primary) hypertension: Secondary | ICD-10-CM | POA: Diagnosis not present

## 2020-10-14 DIAGNOSIS — I2510 Atherosclerotic heart disease of native coronary artery without angina pectoris: Secondary | ICD-10-CM | POA: Insufficient documentation

## 2020-10-14 DIAGNOSIS — J45909 Unspecified asthma, uncomplicated: Secondary | ICD-10-CM | POA: Diagnosis not present

## 2020-10-14 DIAGNOSIS — Z951 Presence of aortocoronary bypass graft: Secondary | ICD-10-CM | POA: Insufficient documentation

## 2020-10-14 DIAGNOSIS — R824 Acetonuria: Secondary | ICD-10-CM | POA: Diagnosis not present

## 2020-10-14 DIAGNOSIS — Z79899 Other long term (current) drug therapy: Secondary | ICD-10-CM | POA: Diagnosis not present

## 2020-10-14 DIAGNOSIS — Z7982 Long term (current) use of aspirin: Secondary | ICD-10-CM | POA: Insufficient documentation

## 2020-10-14 DIAGNOSIS — Z7984 Long term (current) use of oral hypoglycemic drugs: Secondary | ICD-10-CM | POA: Insufficient documentation

## 2020-10-14 DIAGNOSIS — E1169 Type 2 diabetes mellitus with other specified complication: Secondary | ICD-10-CM | POA: Insufficient documentation

## 2020-10-14 LAB — POCT URINALYSIS DIPSTICK
Blood, UA: NEGATIVE
Glucose, UA: POSITIVE — AB
Ketones, UA: 40
Leukocytes, UA: NEGATIVE
Nitrite, UA: NEGATIVE
Protein, UA: NEGATIVE
Spec Grav, UA: 1.02 (ref 1.010–1.025)
Urobilinogen, UA: 0.2 E.U./dL
pH, UA: 6 (ref 5.0–8.0)

## 2020-10-14 LAB — CBC
HCT: 44 % (ref 36.0–46.0)
Hemoglobin: 13.3 g/dL (ref 12.0–15.0)
MCH: 21.9 pg — ABNORMAL LOW (ref 26.0–34.0)
MCHC: 30.2 g/dL (ref 30.0–36.0)
MCV: 72.4 fL — ABNORMAL LOW (ref 80.0–100.0)
Platelets: 229 10*3/uL (ref 150–400)
RBC: 6.08 MIL/uL — ABNORMAL HIGH (ref 3.87–5.11)
RDW: 13.4 % (ref 11.5–15.5)
WBC: 6.2 10*3/uL (ref 4.0–10.5)
nRBC: 0 % (ref 0.0–0.2)

## 2020-10-14 LAB — URINALYSIS, ROUTINE W REFLEX MICROSCOPIC
Bacteria, UA: NONE SEEN
Bilirubin Urine: NEGATIVE
Glucose, UA: >=500 mg/dL — AB
Hgb urine dipstick: NEGATIVE
Ketones, ur: 20 mg/dL — AB
Leukocytes,Ua: NEGATIVE
Nitrite: NEGATIVE
Protein, ur: 30 mg/dL — AB
Specific Gravity, Urine: 1.027 (ref 1.005–1.030)
pH: 5 (ref 5.0–8.0)

## 2020-10-14 LAB — BASIC METABOLIC PANEL
Anion gap: 12 (ref 5–15)
BUN: 7 mg/dL — ABNORMAL LOW (ref 8–23)
CO2: 23 mmol/L (ref 22–32)
Calcium: 10.5 mg/dL — ABNORMAL HIGH (ref 8.9–10.3)
Chloride: 98 mmol/L (ref 98–111)
Creatinine, Ser: 0.96 mg/dL (ref 0.44–1.00)
GFR, Estimated: >60 mL/min (ref >60)
Glucose, Bld: 342 mg/dL — ABNORMAL HIGH (ref 70–99)
Potassium: 4.2 mmol/L (ref 3.5–5.1)
Sodium: 133 mmol/L — ABNORMAL LOW (ref 135–145)

## 2020-10-14 LAB — GLUCOSE, CAPILLARY: Glucose-Capillary: 363 mg/dL — ABNORMAL HIGH (ref 70–99)

## 2020-10-14 NOTE — Progress Notes (Signed)
   CC: hyperglycemia follow up   HPI:  [name] is a 64 y.o. female with PMHx as listed below presenting for follow up of her hyperglycemia. She has a history of diabetes and is on Lantus 16X daily and Trulicity 1.5mg  weekly. She was recently evaluated in the ED on 10/16 for hyperglycemia. She notes that she has been having high glucose readings over the past several weeks despite no change in diet. Notes as high as >600. She tried taking Novolog 10U on 10/15 without any significant improvement in her CBGs.  Endorses nausea, abdominal pain, decreased oral intake, polyuria and polydipsia.   Past Medical History:  Diagnosis Date  . Anxiety   . Arthritis   . Chest pain 09/26/2018  . Congenital blindness    Left  eye  . Coronary artery disease    per cardiac cath 2015-- mild disease involving LAD and branches ,  LCFx and branches, and mild disease pRCA  . Depression   . Diabetes mellitus, type II ([place])    followed by pcp  . Glaucoma   . Glaucoma, both eyes   . Hepatitis B infection pt unsure   resolved  . History of kidney stones    right stone  . History of transient ischemic attack (TIA) 2017   per pt no residuals  . HTN (hypertension)    followed by pcp  . Hyperlipidemia   . PAD (peripheral artery disease) ([place])   . Poor dental hygiene   . Stroke ([place])   . Tobacco abuse    .5 PPD smoker   Review of Systems:  Negative except as stated in HPI.  Physical Exam:  Vitals:   10/14/20 1508  BP: (!) 149/94  Pulse: 81  Temp: 99.3 F (37.4 C)  TempSrc: Oral  SpO2: 99%  Weight: 162 lb 3.2 oz (73.6 kg)  Height: 5\' 7"  (1.702 m)   Physical Exam  Constitutional: Appears well-developed and well-nourished. No distress.  HENT: Normocephalic and atraumatic, EOMI, conjunctiva normal, dry mucus membranes Cardiovascular: Normal rate, regular rhythm, S1 and S2 present, no murmurs, rubs, gallops.  Distal pulses intact Respiratory: No respiratory distress, no accessory muscle  use.  Effort is normal.  Lungs are clear to auscultation bilaterally. GI: Nondistended, soft, nontender to palpation, normal active bowel sounds Musculoskeletal: Normal bulk and tone.  No peripheral edema noted. Neurological: Is alert and oriented x4, no apparent focal deficits noted. Skin: Warm and dry.  No rash, erythema, lesions noted.  Assessment & Plan:   See Encounters Tab for problem based charting.  Patient discussed with Dr. Evette Doffing

## 2020-10-14 NOTE — ED Notes (Signed)
Please have patient call her daughter

## 2020-10-14 NOTE — Assessment & Plan Note (Addendum)
Susan Holmes is presenting for ED follow up for hyperglycemia. Most recent HbA1c of 6.7. She endorses one week history of blood glucose readings that have been consistently >400 and she was evaluated for this in the ED on 10/16 for concerns of DKA. No evidence of DKA at the time; patient was treated with IV fluids and insulin with improvement of CBG to 311 and she was discharged. Patient is on Lantus 42P daily and Trulicity 1.5mg  weekly. She endorses medication compliance and denies any acute illness or dietary indiscretion. She endorses polyuria, polydipsia, abdominal pain and nausea. On examination, does have dry mucous membranes. Also noted to have moderate ketonuria in UA dipstick. At this time, patient has symptomatic hyperglycemia with ketonuria; concerns for possible DKA.  Will have patient f/u in ED for labs and further evaluation and treatment, as indicated, with IV fluids and insulin.   Plan: - Will have patient emergently evaluated in ED for possible DKA  - Follow up in 1 week (or sooner) for insulin regimen adjustment    ADDENDUM: Patient's work up in the ED was negative for hyperglycemic crisis. Patient was discharged to home to continue her regimen. She notes that her fasting blood sugar was 419 this morning for which she took Lantus 16U. She notes that she feels better currently. In setting of her continued hyperglycemia, patient advised to increase Lantus to 33U and start Novolog 10U tid. Advised her to continue checking her blood sugars at home and will follow up in 1 week.  At her follow up, will discuss CGM and provide either professional grade or sample of CGM through [facility].

## 2020-10-14 NOTE — Patient Instructions (Addendum)
Susan Holmes,  It was a pleasure seeing you in clinic. Today we discussed:   Diabetes: At this time, I am concerned that you might be in diabetic ketoacidosis. I would like you to go to the [facility] for further evaluation.   If you have any questions or concerns, please call our clinic at [phone] between 9am-5pm and after hours call [phone] and ask for the internal medicine resident on call. If you feel you are having a medical emergency please call 911.   Thank you, we look forward to helping you remain healthy!

## 2020-10-14 NOTE — ED Triage Notes (Signed)
Patient arrives to ED from her PCP office with complaints of hyperglycemia. Pt states over the past week she has been tired, vision problems, constipated, polyuria, and polydipsia. Pt states that her blood sugar was in the 300's today and she had ketones in her urine. Is here to rule out DKA.

## 2020-10-15 ENCOUNTER — Telehealth: Payer: Self-pay | Admitting: *Deleted

## 2020-10-15 ENCOUNTER — Telehealth: Payer: Medicare Other

## 2020-10-15 DIAGNOSIS — E1165 Type 2 diabetes mellitus with hyperglycemia: Secondary | ICD-10-CM | POA: Diagnosis not present

## 2020-10-15 LAB — CBG MONITORING, ED: Glucose-Capillary: 285 mg/dL — ABNORMAL HIGH (ref 70–99)

## 2020-10-15 MED ORDER — INSULIN ASPART 100 UNIT/ML ~~LOC~~ SOLN: 10.0000 [IU] | Freq: Three times a day (TID) | SUBCUTANEOUS | 0 refills | Status: DC

## 2020-10-15 MED ORDER — LANTUS SOLOSTAR 100 UNIT/ML ~~LOC~~ SOPN: 33.0000 [IU] | PEN_INJECTOR | Freq: Every day | SUBCUTANEOUS | 1 refills | Status: DC

## 2020-10-15 NOTE — Telephone Encounter (Signed)
I called Ms Zacher and discussed with her recommendation to increase her Lantus to 33U daily and start Novolog 10U tid with meals. Advised to continue checking CBG at home 4x/day with meals and will have her follow up in 1 week for CBG monitoring/insulin adjustments and for possible CGM if patient is agreeable

## 2020-10-15 NOTE — ED Provider Notes (Signed)
[facility] EMERGENCY DEPARTMENT Provider Note   CSN: [id number] Arrival date & time: 10/14/20  1622     History Chief Complaint  Patient presents with  . Hyperglycemia    Susan Holmes is a 64 y.o. female.  The history is provided by the patient.  Hyperglycemia Blood sugar level PTA:  500 Severity:  Moderate Onset quality:  Gradual Timing:  Constant Progression:  Unchanged Chronicity:  Chronic Diabetes status:  Controlled with insulin Current diabetic therapy:  Trulicity and insulin  Context: not change in medication, not insulin pump use, not new diabetes diagnosis, not noncompliance, not recent change in diet and not recent illness   Relieved by:  Nothing Ineffective treatments:  None tried Associated symptoms: no abdominal pain, no altered mental status, no chest pain, no confusion, no dehydration, no diaphoresis, no dizziness, no dysuria, no fatigue, no fever, no increased appetite, no increased thirst, no malaise, no nausea, no polyuria, no shortness of breath, no syncope, no vomiting, no weakness and no weight change   Risk factors: no family hx of diabetes   Sent in from clinic for elevated sugar.  No symptoms, this is an ongoing issue.     Past Medical History:  Diagnosis Date  . Anxiety   . Arthritis   . Chest pain 09/26/2018  . Congenital blindness    Left  eye  . Coronary artery disease    per cardiac cath 2015-- mild disease involving LAD and branches ,  LCFx and branches, and mild disease pRCA  . Depression   . Diabetes mellitus, type II ([place])    followed by pcp  . Glaucoma   . Glaucoma, both eyes   . Hepatitis B infection pt unsure   resolved  . History of kidney stones    right stone  . History of transient ischemic attack (TIA) 2017   per pt no residuals  . HTN (hypertension)    followed by pcp  . Hyperlipidemia   . PAD (peripheral artery disease) ([place])   . Poor dental hygiene   . Stroke ([place])   . Tobacco abuse    .5  PPD smoker    Patient Active Problem List   Diagnosis Date Noted  . Polypharmacy 09/09/2020  . Neck pain on left side 12/17/2019  . PAD (peripheral artery disease) ([place]) 10/22/2019  . Sinus congestion 10/11/2019  . PVD (peripheral vascular disease) ([place]) 10/04/2019  . Insulin dependent type 2 diabetes mellitus ([place]) 10/04/2019  . Claudication ([place]) 09/14/2019  . Left foot pain 07/12/2019  . Paronychia of great toe, left 06/21/2019  . Diaphoresis 02/15/2019  . Nephrolithiasis 10/03/2018  . Left chest pressure 09/26/2018  . Upper back pain on right side 09/19/2018  . Callus of foot 09/19/2018  . Reactive airway disease 06/20/2018  . Unintentional weight loss 06/14/2018  . History of vitamin D deficiency 09/06/2017  . Forgetfulness 06/07/2017  . Dyspepsia 04/26/2017  . History of CVA (cerebrovascular accident) 07/23/2016  . Cough 07/08/2016  . Lower back pain 08/06/2015  . Osteoarthritis 12/11/2014  . History of chest pain 10/07/2014  . Pre-operative cardiovascular examination 10/07/2014  . Diarrhea 04/04/2014  . Liver hemangioma 04/04/2014  . RBC microcytosis 02/05/2014  . Hyperparathyroidism ([place]) 03/08/2013  . Calcific tendinitis of right shoulder 02/26/2012  . Fatigue 12/14/2011  . Uncontrolled type II diabetes mellitus ([place]) 06/12/2007  . Hyperlipidemia associated with type 2 diabetes mellitus ([place]) 06/12/2007  . ANXIETY 06/12/2007  . TOBACCO ABUSE 06/12/2007  . Depression 06/12/2007  .  CONGENITAL BLINDNESS, LEFT EYE 06/12/2007  . Essential hypertension 06/12/2007    Past Surgical History:  Procedure Laterality Date  . ABDOMINAL AORTOGRAM W/LOWER EXTREMITY N/A 09/14/2019   Procedure: ABDOMINAL AORTOGRAM W/LOWER EXTREMITY;  Surgeon: Elam Dutch, MD;  Location: [facility] LAB;  Service: Cardiovascular;  Laterality: N/A;  . ABDOMINAL HYSTERECTOMY  1980s   unsure if ovaries were taken  . BYPASS GRAFT Left 10/22/2019    Left femoral to above-knee popliteal  bypass using reversed ipsilateral greater saphenous vein  . colonoscopy    . FEMORAL-POPLITEAL BYPASS GRAFT Left 10/22/2019   Procedure: LEFT FEMORAL-ABOVE KNEE POPLITEAL  BYPASS GRAFT with REVERSED GREATER SAPHENOUS VEIN;  Surgeon: Elam Dutch, MD;  Location: [facility];  Service: Vascular;  Laterality: Left;  . GLAUCOMA SURGERY Right 2017  . IR URETERAL STENT RIGHT NEW ACCESS W/O SEP NEPHROSTOMY CATH  10/03/2018  . LEFT HEART CATHETERIZATION WITH CORONARY ANGIOGRAM N/A 10/09/2014   Procedure: LEFT HEART CATHETERIZATION WITH CORONARY ANGIOGRAM;  Surgeon: Troy Sine, MD;  [facility] C[facility] LAB;  Service: Cardiovascular;  Laterality: N/A;  . NEPHROLITHOTOMY Right 10/03/2018   Procedure: RIGHT NEPHROLITHOTOMY PERCUTANEOUS FIRST STAGE;  Surgeon: Irine Seal, MD;  Location: [facility] ORS;  Service: Urology;  Laterality: Right;  . PERIPHERAL VASCULAR INTERVENTION Left 09/14/2019   Procedure: PERIPHERAL VASCULAR INTERVENTION;  Surgeon: Elam Dutch, MD;  Location: [facility] CV LAB;  Service: Cardiovascular;  Laterality: Left;   left external iliac stent   . TEE WITHOUT CARDIOVERSION N/A 08/17/2016   Procedure: TRANSESOPHAGEAL ECHOCARDIOGRAM (TEE);  Surgeon: Sueanne Margarita, MD;  Location: [facility] ENDOSCOPY;  Service: Cardiovascular;  Laterality: N/A;     OB History   No obstetric history on file.     Family History  Problem Relation Age of Onset  . Heart disease Mother 59  . Hypertension Mother   . CVA Mother   . Heart disease Father 22  . CVA Sister   . Heart disease Sister   . Aneurysm Sister   . CVA Brother   . Aneurysm Brother   . CVA Maternal Grandmother   . CVA Maternal Grandfather   . Hypertension Brother   . Colon cancer Neg Hx   . Esophageal cancer Neg Hx   . Stomach cancer Neg Hx     Social History   Tobacco Use  . Smoking status: Current Every Day Smoker    Packs/day: 0.50    Years: 39.00    Pack years: 19.50    Types: Cigarettes    Start date: 57  . Smokeless  tobacco: Current User    Types: Chew  . Tobacco comment: 6 cigs per day  Vaping Use  . Vaping Use: Never used  Substance Use Topics  . Alcohol use: No    Alcohol/week: 0.0 standard drinks  . Drug use: No    Home Medications Prior to Admission medications   Medication Sig Start Date End Date Taking? Authorizing Provider  allopurinol (ZYLOPRIM) 100 MG tablet Take 1 tablet (100 mg total) by mouth daily. 09/29/20   [name], MD  amLODipine (NORVASC) 10 MG tablet Take 10 mg by mouth daily.    [provider]  amLODIPine-Valsartan-HCTZ 10-160-12.5 MG TABS Take 1 tablet by mouth daily. 10/08/20 01/06/21  [name], MD  aspirin EC 81 MG tablet Take 1 tablet (81 mg total) by mouth daily. 09/29/20   [name], MD  Blood Glucose Monitoring Suppl (ACCU-CHEK GUIDE ME) w/Device KIT Check blood sugar 3 times a  day 08/18/20   [name], MD  busPIRone (BUSPAR) 7.5 MG tablet Take 7.5 mg by mouth 2 (two) times daily.     [provider]  carvedilol (COREG) 12.5 MG tablet TAKE 1 TABLET(12.5 MG) BY MOUTH TWICE DAILY Patient taking differently: Take 12.5 mg by mouth 2 (two) times daily with a meal.  09/29/20   Aslam, Loralyn Freshwater, MD  diclofenac Sodium (VOLTAREN) 1 % GEL Apply 2 g topically daily as needed (pain).  02/17/20   [provider]  dorzolamide-timolol (COSOPT) 22.3-6.8 MG/ML ophthalmic solution Place 1 drop into both eyes every evening. 09/29/20   [name], MD  Dulaglutide (TRULICITY) 1.5 OI/7.1IW SOPN ADMINISTER 1.5 MG UNDER THE SKIN EVERY SATURDAY Patient taking differently: Inject 1.5 mg into the skin once a week. Saturday 09/29/20   [name], MD  DULoxetine (CYMBALTA) 30 MG capsule TAKE 2 CAPSULE(60 MG) BY MOUTH DAILY Patient taking differently: Take 60 mg by mouth daily.  09/29/20   [name], MD  fluticasone (FLONASE) 50 MCG/ACT nasal spray Place 1 spray into both nostrils daily. Patient taking differently: Place 1 spray into both nostrils daily as needed  for allergies.  05/14/20 10/11/20  [name], MD  gabapentin (NEURONTIN) 300 MG capsule Take 300 mg by mouth 3 (three) times daily.    [provider]  glucose blood (ACCU-CHEK GUIDE) test strip Check blood sugar 3 times per day 09/29/20   [name], MD  hydrALAZINE (APRESOLINE) 10 MG tablet Take 10 mg by mouth in the morning and at bedtime.     [provider]  hydrOXYzine (VISTARIL) 25 MG capsule Take 25 mg by mouth 3 (three) times daily as needed for anxiety.    [provider]  insulin glargine (LANTUS SOLOSTAR) 100 UNIT/ML Solostar Pen Inject 28 Units into the skin at bedtime. 09/29/20   [name], MD  Insulin Pen Needle (VALUMARK PEN NEEDLES) 31G X 8 MM MISC 1 each by Does not apply route daily. 09/29/20   [name], MD  Lancets (ACCU-CHEK SOFT TOUCH) lancets Check blood sugar 3 times a day 09/29/20   [name], MD  lidocaine (XYLOCAINE) 5 % ointment Apply 1 application topically daily as needed for moderate pain.  05/13/20   [provider]  losartan (COZAAR) 100 MG tablet Take 100 mg by mouth daily.    [provider]  mirtazapine (REMERON) 45 MG tablet Take 1 tablet (45 mg total) by mouth at bedtime. 09/29/20   [name], MD  NARCAN 4 MG/0.1ML LIQD nasal spray kit Place 1 spray into the nose once.  04/29/20   [provider]  oxyCODONE (ROXICODONE) 15 MG immediate release tablet Take 15 mg by mouth every other day as needed for pain.  09/10/20   [provider]  polyethylene glycol powder (GLYCOLAX/MIRALAX) 17 GM/SCOOP powder SMARTSIG:1 Scoopful By Mouth 4 Times Daily Patient taking differently: Take 1 Container by mouth daily. SMARTSIG:1 Scoopful By Mouth 4 Times Daily 09/29/20   [name], MD  prednisoLONE acetate (PRED FORTE) 1 % ophthalmic suspension Place 1 drop into the right eye daily. 09/29/20   [name], MD  pregabalin (LYRICA) 100 MG capsule Take 1 capsule (100 mg total) by mouth 2 (two) times  daily. Patient not taking: Reported on 10/11/2020 09/29/20 12/28/20  [name], MD  pregabalin (LYRICA) 150 MG capsule Take 150 mg by mouth daily.     [provider]  rosuvastatin (CRESTOR) 20 MG tablet Take 1 tablet (20 mg total) by mouth  at bedtime. NEED OV. 09/29/20   [name], MD  tiZANidine (ZANAFLEX) 4 MG capsule Take 4 mg by mouth 3 (three) times daily as needed for muscle spasms.     [provider]  traZODone (DESYREL) 100 MG tablet Take 300 mg by mouth at bedtime.    [provider]    Allergies    Ace inhibitors, Lipitor [atorvastatin], and Chantix [varenicline]  Review of Systems   Review of Systems  Constitutional: Negative for diaphoresis, fatigue and fever.  HENT: Negative for congestion.   Eyes: Negative for visual disturbance.  Respiratory: Negative for shortness of breath.   Cardiovascular: Negative for chest pain and syncope.  Gastrointestinal: Negative for abdominal pain, nausea and vomiting.  Endocrine: Negative for polydipsia and polyuria.  Genitourinary: Negative for dysuria.  Musculoskeletal: Negative for arthralgias.  Skin: Negative for rash.  Neurological: Negative for dizziness and weakness.  Psychiatric/Behavioral: Negative for confusion.  All other systems reviewed and are negative.   Physical Exam Updated Vital Signs BP 107/79 (BP Location: Left Arm)   Pulse (!) 102   Temp 98.4 F (36.9 C) (Oral)   Resp 18   Ht '5\' 7"'  (1.702 m)   Wt 73.5 kg   SpO2 100%   BMI 25.37 kg/m   Physical Exam Vitals and nursing note reviewed.  Constitutional:      General: She is not in acute distress.    Appearance: Normal appearance.  HENT:     Head: Normocephalic and atraumatic.     Nose: Nose normal.  Eyes:     Conjunctiva/sclera: Conjunctivae normal.     Pupils: Pupils are equal, round, and reactive to light.  Cardiovascular:     Rate and Rhythm: Normal rate and regular rhythm.     Pulses: Normal pulses.     Heart sounds:  Normal heart sounds.  Pulmonary:     Effort: Pulmonary effort is normal.     Breath sounds: Normal breath sounds.  Abdominal:     General: Abdomen is flat. Bowel sounds are normal.     Palpations: Abdomen is soft.     Tenderness: There is no abdominal tenderness. There is no guarding or rebound.  Musculoskeletal:        General: Normal range of motion.     Cervical back: Normal range of motion and neck supple.  Skin:    General: Skin is warm and dry.     Capillary Refill: Capillary refill takes less than 2 seconds.  Neurological:     General: No focal deficit present.     Mental Status: She is alert and oriented to person, place, and time.     Deep Tendon Reflexes: Reflexes normal.  Psychiatric:        Mood and Affect: Mood normal.        Behavior: Behavior normal.     ED Results / Procedures / Treatments   Labs (all labs ordered are listed, but only abnormal results are displayed) Results for orders placed or performed during the hospital encounter of 97/35/32  Basic metabolic panel  Result Value Ref Range   Sodium 133 (L) 135 - 145 mmol/L   Potassium 4.2 3.5 - 5.1 mmol/L   Chloride 98 98 - 111 mmol/L   CO2 23 22 - 32 mmol/L   Glucose, Bld 342 (H) 70 - 99 mg/dL   BUN 7 (L) 8 - 23 mg/dL   Creatinine, Ser 0.96 0.44 - 1.00 mg/dL   Calcium 10.5 (H) 8.9 - 10.3 mg/dL  GFR, Estimated >60 >60 mL/min   Anion gap 12 5 - 15  CBC  Result Value Ref Range   WBC 6.2 4.0 - 10.5 K/uL   RBC 6.08 (H) 3.87 - 5.11 MIL/uL   Hemoglobin 13.3 12.0 - 15.0 g/dL   HCT 44.0 36 - 46 %   MCV 72.4 (L) 80.0 - 100.0 fL   MCH 21.9 (L) 26.0 - 34.0 pg   MCHC 30.2 30.0 - 36.0 g/dL   RDW 13.4 11.5 - 15.5 %   Platelets 229 150 - 400 K/uL   nRBC 0.0 0.0 - 0.2 %  Urinalysis, Routine w reflex microscopic Urine, Clean Catch  Result Value Ref Range   Color, Urine YELLOW YELLOW   APPearance CLEAR CLEAR   Specific Gravity, Urine 1.027 1.005 - 1.030   pH 5.0 5.0 - 8.0   Glucose, UA >=500 (A) NEGATIVE  mg/dL   Hgb urine dipstick NEGATIVE NEGATIVE   Bilirubin Urine NEGATIVE NEGATIVE   Ketones, ur 20 (A) NEGATIVE mg/dL   Protein, ur 30 (A) NEGATIVE mg/dL   Nitrite NEGATIVE NEGATIVE   Leukocytes,Ua NEGATIVE NEGATIVE   RBC / HPF 0-5 0 - 5 RBC/hpf   WBC, UA 0-5 0 - 5 WBC/hpf   Bacteria, UA NONE SEEN NONE SEEN   Squamous Epithelial / LPF 0-5 0 - 5   Mucus PRESENT   CBG monitoring, ED  Result Value Ref Range   Glucose-Capillary 285 (H) 70 - 99 mg/dL   DG Chest 1 View  Result Date: 10/11/2020 CLINICAL DATA:  Weakness EXAM: CHEST  1 VIEW COMPARISON:  08/01/2018 FINDINGS: Heart and mediastinal contours are within normal limits. No focal opacities or effusions. No acute bony abnormality. IMPRESSION: No active disease. Electronically Signed   By: Rolm Baptise M.D.   On: 10/11/2020 18:19    Radiology No results found.  Procedures Procedures (including critical care time)  Medications Ordered in ED Medications - No data to display  ED Course  I have reviewed the triage vital signs and the nursing notes.  Pertinent labs & imaging results that were available during my care of the patient were reviewed by me and considered in my medical decision making (see chart for details).      150 Case d/w IM resident on call. Case d/w Dr. Lenna Sciara.  Call the clinic in the am.  To be seen same day Susan Holmes was evaluated in Emergency Department on 10/15/2020 for the symptoms described in the history of present illness. She was evaluated in the context of the global COVID-19 pandemic, which necessitated consideration that the patient might be at risk for infection with the SARS-CoV-2 virus that causes COVID-19. Institutional protocols and algorithms that pertain to the evaluation of patients at risk for COVID-19 are in a state of rapid change based on information released by regulatory bodies including the CDC and federal and state organizations. These policies and algorithms were followed during the  patient's care in the ED.  Final Clinical Impression(s) / ED Diagnoses  Return for intractable cough, coughing up blood,fevers >100.4 unrelieved by medication, shortness of breath, intractable vomiting, chest pain, shortness of breath, weakness,numbness, changes in speech, facial asymmetry,abdominal pain, passing out,Inability to tolerate liquids or food, cough, altered mental status or any concerns. No signs of systemic illness or infection. The patient is nontoxic-appearing on exam and vital signs are within normal limits.   I have reviewed the triage vital signs and the nursing notes. Pertinent labs &imaging results that were available  during my care of the patient were reviewed by me and considered in my medical decision making (see chart for details).After history, exam, and medical workup I feel the patient has beenappropriately medically screened and is safe for discharge home. Pertinent diagnoses were discussed with the patient. Patient was given return precautions.   Jonpaul Lumm, MD 10/15/20 [id number]

## 2020-10-15 NOTE — Addendum Note (Signed)
Addended by: [name] on: 10/15/2020 03:28 PM   Modules accepted: Orders

## 2020-10-15 NOTE — Progress Notes (Signed)
Internal Medicine Clinic Attending ° °Case discussed with Dr. Aslam  At the time of the visit.  We reviewed the resident’s history and exam and pertinent patient test results.  I agree with the assessment, diagnosis, and plan of care documented in the resident’s note.  °

## 2020-10-15 NOTE — Telephone Encounter (Signed)
Patient called in with daughter. States she was sent to ED from clinic yesterday. Waited 10 hours in ED and then was told to go home without any treatment. This morning she is c/o feeling tired and thirsty. Glucometer is not working at present so unable to check CBG. Please advise. Hubbard Hartshorn, BSN, RN-BC

## 2020-10-15 NOTE — Telephone Encounter (Signed)
I'll defer to the yellow team. I am happy to call or see Susan Holmes as needed for a professional Continuous glucose monitor(CGM)  or to start a personal CGM.

## 2020-10-15 NOTE — Telephone Encounter (Signed)
Her blood sugar was not in hyperglycemic crisis based on her labs in the ED. If she resumes her home insulin and drink plenty of fluids, her symptoms should improve. She needs to get a new glucometer if her current one is not working. I can send a script to pharmacy.

## 2020-10-15 NOTE — Telephone Encounter (Signed)
I called Susan Holmes to offer her a sample personal Continuous glucose monitor citing that it could help her prevent hypoglycemia and hyperglycemia. She does not want to come back to the office right now. She wants to wait until she gets her and states she can do the same with checking her sugar with her meter.

## 2020-10-15 NOTE — Telephone Encounter (Signed)
Returned call to patient and relayed info below. She states she was able to get her glucometer working and this AM's fasting BS is 419. She states she took 16 units of lantus this AM also. Rx states to take 28 units lantus qHS. States she did that last night. Offered to schedule patient with [name]. She declined stating, "I know exactly what I need to do." Please advise if she should still take 28 units lantus this PM after taking 16 units this morning. Hubbard Hartshorn, BSN, RN-BC

## 2020-10-16 ENCOUNTER — Encounter: Payer: Self-pay | Admitting: Cardiovascular Disease

## 2020-10-16 ENCOUNTER — Telehealth: Payer: Self-pay | Admitting: Cardiovascular Disease

## 2020-10-16 ENCOUNTER — Ambulatory Visit (INDEPENDENT_AMBULATORY_CARE_PROVIDER_SITE_OTHER): Payer: Medicare Other | Admitting: Cardiovascular Disease

## 2020-10-16 ENCOUNTER — Other Ambulatory Visit: Payer: Self-pay

## 2020-10-16 VITALS — BP 140/92 | HR 87 | Ht 67.0 in | Wt 161.0 lb

## 2020-10-16 DIAGNOSIS — I10 Essential (primary) hypertension: Secondary | ICD-10-CM

## 2020-10-16 DIAGNOSIS — E785 Hyperlipidemia, unspecified: Secondary | ICD-10-CM | POA: Diagnosis not present

## 2020-10-16 DIAGNOSIS — E1169 Type 2 diabetes mellitus with other specified complication: Secondary | ICD-10-CM

## 2020-10-16 MED ORDER — CHLORTHALIDONE 25 MG PO TABS: 12.5000 mg | ORAL_TABLET | Freq: Every day | ORAL | 3 refills | Status: DC

## 2020-10-16 NOTE — Telephone Encounter (Signed)
Requesting to speak with a nurse about meds, please call pt back.  

## 2020-10-16 NOTE — Telephone Encounter (Signed)
Pt c/o medication issue:  1. Name of Medication:  chlorthalidone hydrALAZINE (APRESOLINE) 10 MG tablet  2. How are you currently taking this medication (dosage and times per day)? Hydralazine 1 tablet twice a day, chlorthalidone half a tablet daily  3. Are you having a reaction (difficulty breathing--STAT)? no  4. What is your medication issue? Patient states the chlorthalidone is not on her medication list. She would like to know if she is supposed to take both medications.

## 2020-10-16 NOTE — Telephone Encounter (Signed)
Attempted to call patient - VM not accepting messages  Rx sent to pharmacy per MD note  Per MD note 10/15/2020 # Hypertension: # PVCs: BP is above goal both here and at home.  We will add chlorthalidone 12.5 mg daily.  Check a basic metabolic panel in 1 week.  Continue amlodipine, carvedilol, and losartan.  Susan Holmes to started a new medication yesterday.  Therefore will not make any changes at this time.  She has follow-up with her PCP next week, at which time I presume she will have a basic metabolic panel checked given that HCTZ was added to her regimen.  I did instruct her to stop taking losartan given that valsartan is in her combination tablet.  If her blood pressure remains poorly controlled could consider increasing hydralazine or the dose of her losartan and HCTZ.

## 2020-10-16 NOTE — Telephone Encounter (Signed)
Returned call to patient. States pharmacy is no longer able to get amlodipine-valsartan-HCTZ combo. Will need these meds sent separately. Also, wants to know if she should be taking hydralazine. Patient saw cards today. Advised her to call cards for this. Per Epic note, patient was to start chlorthidone today but it is not on current med list. Advised her to ask cards this question as well. Hubbard Hartshorn, BSN, RN-BC

## 2020-10-16 NOTE — Telephone Encounter (Addendum)
Discussed with Dr Oval Linsey and patient is not to start Chlorthalidone, carried over from old note and today's visit not completed/closed  Called [facility] to cancel Rx,  Tried to call patient, VM not accepting messages  Discussed Hydralazine 10 mg at bedtime with Dr Oval Linsey, she did not prescribe this medication

## 2020-10-16 NOTE — Patient Instructions (Addendum)
Medication Instructions:  STOP LOSARTAN   STOP AMLODIPINE  TAKE THE AMLODIPINE-VALSARTAN COMBINATION PILL DAILY  *If you need a refill on your cardiac medications before your next appointment, please call your pharmacy*  Lab Work: NONE   Testing/Procedures: NONE   Follow-Up: At [facility], you and your health needs are our priority.  As part of our continuing mission to provide you with exceptional heart care, we have created designated Provider Care Teams.  These Care Teams include your primary Cardiologist (physician) and Advanced Practice Providers (APPs -  Physician Assistants and Nurse Practitioners) who all work together to provide you with the care you need, when you need it.  We recommend signing up for the patient portal called "MyChart".  Sign up information is provided on this After Visit Summary.  MyChart is used to connect with patients for Virtual Visits (Telemedicine).  Patients are able to view lab/test results, encounter notes, upcoming appointments, etc.  Non-urgent messages can be sent to your provider as well.   To learn more about what you can do with MyChart, go to [email or web address].    Your next appointment:   2 month(s)  The format for your next appointment:   In Person  Provider:   You may see Skeet Latch, MD or one of the following Advanced Practice Providers on your designated Care Team:    Kerin Ransom, PA-C  [place], [place]  Coletta Memos, [place]

## 2020-10-16 NOTE — Progress Notes (Signed)
Cardiology Office Note   Date:  10/16/2020   ID:  HIAWATHA DRESSEL, DOB 1956-06-07, MRN [id number]  PCP:  [name], MD  Cardiologist:   Skeet Latch, MD   No chief complaint on file.    History of Present Illness: Susan Holmes is a 64 y.o. female with diabetes mellitus type 2, hypertension, hyperlipidemia, non-obstructive CAD,  carotid stenosis, stroke and tobacco abuse who presents for follow up. Susan Holmes was seen 08/13/16 with chest pain. She  was referred for [facility] 08/24/16 that revealed LVEF 52% and was negative for ischemia. She was also noted to have a history of ischemic stroke and had not undergone TEE. She had a TEE on 08/17/16 that was negative for thrombus and did not reveal an ASD or PFO.  Carotid Doppler showed moderate L ECA stenosis and mild L ICA stenosis.  She was started on aspirin and atorvastatin 80 mg but this was stopped due to myalgias.  She did report frequent palpitations and had a 30 day event monitor placed that revealed occasional PACs and PVCs.  Susan Holmes previously had an echo 09/2014 that revealed LVEF 40-45% with diffuse hypokinesis and trace MR.  She had a nuclear stress test that showed LVEF 38% with a fixed apical defect.  She had a LHC at that time without obstructive coronary disease and her EF on left ventriculography was 55% and LVEDP was 10 mmHg.  She was referred to pulmonology and saw Dr. Vaughan Browner.  PFTs did not show clear obstruction.  She was started on Symbicort 04/2017.  She reported chest pain in 09/2018 and had a Lexiscan Myoview that revealed LVEF 57% with no ischemia.  Lately Susan Holmes has struggled with hypertension and hyperglycemia.  Her A1c was 6.7% in August.  However lately her blood pressures have been very elevated.  She denies any symptoms of infection.  She has been seen in the ED multiple times and is now working with her primary care.  Yesterday she was also started on amlodipine/Voltaren 7/HCTZ for poorly  controlled hypertension.  She did not understand that she should stop taking losartan with this new medication change.  She did stop the amlodipine.  She checks her blood pressure at home but does not really remember the numbers.  She notes that she does try to limit her sodium intake.  She only drinks a half a cup of coffee daily does not drink alcohol.  She continues to smoke less than half a pack of cigarettes daily.  She does continue to have some shortness of breath when going up and down steps and has intermittent chest pain that occurs when lying down, sitting, or going up steps.  It has not changed since 2019 when she last had her stress test. Yesterday she started amlodipine/valsartan/HCTZ  Secondary Causes of Hypertension  Medications/Herbal: OCP, steroids, stimulants, antidepressants, weight loss medication, immune suppressants, NSAIDs, sympathomimetics, alcohol, caffeine, licorice, ginseng, St. John's wort, chemo Sleep Apnea: Does not snore Renal artery stenosis: Negative 03/2020 Hyperaldosteronism:: No adenoma on CT 2018 Hyper/hypothyroidism: TSH normal 11/2019 Pheochromocytoma: palpitations, tachycardia, headache, diaphoresis (plasma metanephrines) Cushing's syndrome: Cushingoid facies, central obesity, proximal muscle weakness, and ecchymoses, adrenal incidentaloma (cortisol) Coarctation of the aorta:    Past Medical History:  Diagnosis Date  . Anxiety   . Arthritis   . Chest pain 09/26/2018  . Congenital blindness    Left  eye  . Coronary artery disease    per cardiac cath 2015-- mild disease involving  LAD and branches ,  LCFx and branches, and mild disease pRCA  . Depression   . Diabetes mellitus, type II ([place])    followed by pcp  . Glaucoma   . Glaucoma, both eyes   . Hepatitis B infection pt unsure   resolved  . History of kidney stones    right stone  . History of transient ischemic attack (TIA) 2017   per pt no residuals  . HTN (hypertension)    followed by  pcp  . Hyperlipidemia   . PAD (peripheral artery disease) ([place])   . Poor dental hygiene   . Stroke ([place])   . Tobacco abuse    .5 PPD smoker    Past Surgical History:  Procedure Laterality Date  . ABDOMINAL AORTOGRAM W/LOWER EXTREMITY N/A 09/14/2019   Procedure: ABDOMINAL AORTOGRAM W/LOWER EXTREMITY;  Surgeon: Elam Dutch, MD;  Location: [place] [facility];  Service: Cardiovascular;  Laterality: N/A;  . ABDOMINAL HYSTERECTOMY  1980s   unsure if ovaries were taken  . BYPASS GRAFT Left 10/22/2019    Left femoral to above-knee popliteal bypass using reversed ipsilateral greater saphenous vein  . colonoscopy    . FEMORAL-POPLITEAL BYPASS GRAFT Left 10/22/2019   Procedure: LEFT FEMORAL-ABOVE KNEE POPLITEAL  BYPASS GRAFT with REVERSED GREATER SAPHENOUS VEIN;  Surgeon: Elam Dutch, MD;  Location: [facility];  Service: Vascular;  Laterality: Left;  . GLAUCOMA SURGERY Right 2017  . IR URETERAL STENT RIGHT NEW ACCESS W/O SEP NEPHROSTOMY CATH  10/03/2018  . LEFT HEART CATHETERIZATION WITH CORONARY ANGIOGRAM N/A 10/09/2014   Procedure: LEFT HEART CATHETERIZATION WITH CORONARY ANGIOGRAM;  Surgeon: Troy Sine, MD;  Location: [facility] C[facility] LAB;  Service: Cardiovascular;  Laterality: N/A;  . NEPHROLITHOTOMY Right 10/03/2018   Procedure: RIGHT NEPHROLITHOTOMY PERCUTANEOUS FIRST STAGE;  Surgeon: Irine Seal, MD;  Location: [facility];  Service: Urology;  Laterality: Right;  . PERIPHERAL VASCULAR INTERVENTION Left 09/14/2019   Procedure: PERIPHERAL VASCULAR INTERVENTION;  Surgeon: Elam Dutch, MD;  Location: [place] [facility];  Service: Cardiovascular;  Laterality: Left;   left external iliac stent   . TEE WITHOUT CARDIOVERSION N/A 08/17/2016   Procedure: TRANSESOPHAGEAL ECHOCARDIOGRAM (TEE);  Surgeon: Sueanne Margarita, MD;  Location: [facility] ENDOSCOPY;  Service: Cardiovascular;  Laterality: N/A;    Current Outpatient Medications  Medication Sig Dispense Refill  . allopurinol (ZYLOPRIM) 100 MG tablet Take  1 tablet (100 mg total) by mouth daily. 90 tablet 1  . amLODIPine-Valsartan-HCTZ 10-160-12.5 MG TABS Take 1 tablet by mouth daily. 90 tablet 0  . aspirin EC 81 MG tablet Take 1 tablet (81 mg total) by mouth daily. 90 tablet 1  . Blood Glucose Monitoring Suppl (ACCU-CHEK GUIDE ME) w/Device KIT Check blood sugar 3 times a day 1 kit 0  . busPIRone (BUSPAR) 7.5 MG tablet Take 7.5 mg by mouth 2 (two) times daily.     . carvedilol (COREG) 12.5 MG tablet TAKE 1 TABLET(12.5 MG) BY MOUTH TWICE DAILY 60 tablet 6  . diclofenac Sodium (VOLTAREN) 1 % GEL Apply 2 g topically daily as needed (pain).     . dorzolamide-timolol (COSOPT) 22.3-6.8 MG/ML ophthalmic solution Place 1 drop into both eyes every evening. 10 mL 1  . Dulaglutide (TRULICITY) 1.5 HC/6.2BJ SOPN ADMINISTER 1.5 MG UNDER THE SKIN EVERY SATURDAY 6 mL 1  . DULoxetine (CYMBALTA) 30 MG capsule TAKE 2 CAPSULE(60 MG) BY MOUTH DAILY (Patient taking differently: Take 60 mg by mouth daily. ) 90 capsule 2  . gabapentin (NEURONTIN)  300 MG capsule Take 300 mg by mouth 3 (three) times daily.    Marland Kitchen glucose blood (ACCU-CHEK GUIDE) test strip Check blood sugar 3 times per day 100 each 12  . hydrALAZINE (APRESOLINE) 10 MG tablet Take 10 mg by mouth in the morning and at bedtime.     . hydrOXYzine (ATARAX/VISTARIL) 25 MG tablet Take 25 mg by mouth 3 (three) times daily as needed.    . insulin aspart (NOVOLOG) 100 UNIT/ML injection Inject 10 Units into the skin 3 (three) times daily with meals. 9 mL 0  . insulin glargine (LANTUS SOLOSTAR) 100 UNIT/ML Solostar Pen Inject 33 Units into the skin at bedtime. 15 mL 1  . Insulin Pen Needle (VALUMARK PEN NEEDLES) 31G X 8 MM MISC 1 each by Does not apply route daily. 100 each 3  . Lancets (ACCU-CHEK SOFT TOUCH) lancets Check blood sugar 3 times a day 100 each 12  . lidocaine (XYLOCAINE) 5 % ointment Apply 1 application topically daily as needed for moderate pain.     Marland Kitchen LINZESS 145 MCG CAPS capsule Take 145 mcg by mouth  daily.    . mirtazapine (REMERON) 45 MG tablet Take 1 tablet (45 mg total) by mouth at bedtime. 30 tablet 1  . NARCAN 4 MG/0.1ML LIQD nasal spray kit Place 1 spray into the nose once.     Marland Kitchen oxyCODONE (ROXICODONE) 15 MG immediate release tablet Take 15 mg by mouth every other day as needed for pain.     . polyethylene glycol powder (GLYCOLAX/MIRALAX) 17 GM/SCOOP powder SMARTSIG:1 Scoopful By Mouth 4 Times Daily (Patient taking differently: Take 1 Container by mouth daily. SMARTSIG:1 Scoopful By Mouth 4 Times Daily) 255 g 1  . prednisoLONE acetate (PRED FORTE) 1 % ophthalmic suspension Place 1 drop into the right eye daily. 5 mL 1  . pregabalin (LYRICA) 150 MG capsule Take 150 mg by mouth daily.     . rosuvastatin (CRESTOR) 20 MG tablet Take 1 tablet (20 mg total) by mouth at bedtime. NEED OV. 30 tablet 2  . tiZANidine (ZANAFLEX) 4 MG capsule Take 4 mg by mouth 3 (three) times daily as needed for muscle spasms.     . traZODone (DESYREL) 100 MG tablet Take 300 mg by mouth at bedtime.    . fluticasone (FLONASE) 50 MCG/ACT nasal spray Place 1 spray into both nostrils daily. (Patient taking differently: Place 1 spray into both nostrils daily as needed for allergies. ) 18.2 mL 0   No current facility-administered medications for this visit.    Allergies:   Ace inhibitors, Lipitor [atorvastatin], and Chantix [varenicline]    Social History:  The patient  reports that she has been smoking cigarettes. She started smoking about 40 years ago. She has a 19.50 pack-year smoking history. Her smokeless tobacco use includes chew. She reports that she does not drink alcohol and does not use drugs.   Family History:  The patient's family history includes Aneurysm in her brother and sister; CVA in her brother, maternal grandfather, maternal grandmother, mother, and sister; Heart disease in her sister; Heart disease (age of onset: 22) in her father and mother; Hypertension in her brother and mother.    ROS:   Please see the history of present illness.   Otherwise, review of systems are positive for sinus congestion, clear phlegm.   All other systems are reviewed and negative.    PHYSICAL EXAM: VS:  BP (!) 140/92   Pulse 87   Ht 5' 7" (1.702 m)  Wt 161 lb (73 kg)   SpO2 97%   BMI 25.22 kg/m  , BMI Body mass index is 25.22 kg/m. GENERAL:  Well appearing HEENT: Pupils equal round and reactive, fundi not visualized, oral mucosa unremarkable NECK:  No jugular venous distention, waveform within normal limits, carotid upstroke brisk and symmetric, no bruits LUNGS:  Clear to auscultation bilaterally HEART:  RRR.  PMI not displaced or sustained,S1 and S2 within normal limits, no S3, no S4, no clicks, no rubs, II/VI systolic murmur at the LUSB ABD:  Flat, positive bowel sounds normal in frequency in pitch, no bruits, no rebound, no guarding, no midline pulsatile mass, no hepatomegaly, no splenomegaly EXT:  2 plus pulses throughout, no edema, no cyanosis no clubbing SKIN:  No rashes no nodules NEURO:  Cranial nerves II through XII grossly intact, motor grossly intact throughout PSYCH:  Cognitively intact, oriented to person place and time   EKG:  EKG is not ordered today. The ekg ordered 08/13/16 demonstrates sinus rhythm rate 83 bpm.  Non-specific ST-T changes. Sinus rhythm. Rate 76 bpm.  30 Day Event Monitor 09/01/16: Quality: Fair.  Baseline artifact. Average heart rate: 80 bpm Pauses >2.5 seconds: 0 Occasional PACs and PVCs  TEE 08/17/16: Study Conclusions  - Left ventricle: Systolic function was normal. The estimated   ejection fraction was in the range of 50% to 55%. Wall motion was   normal; there were no regional wall motion abnormalities. - Mitral valve: There was mild regurgitation. - Left atrium: No evidence of thrombus in the atrial cavity or   appendage. No evidence of thrombus in the atrial cavity or   appendage. - Right atrium: No evidence of thrombus in the atrial cavity  or   appendage. - Tricuspid valve: There was trivial regurgitation. - Pulmonic valve: No evidence of vegetation. There was trivial   regurgitation.  Lexiscan Myoview 08/24/16:  The left ventricular ejection fraction is normal (55-65%).  The computer generated EF is 52%. Visually, the EF is closer to 60% and is normal.  There was no ST segment deviation noted during stress.  The study is normal.  This is a low risk study.  Echo 09/2014: LVEF 40-45% with diffuse hypokinesis and trace MR.    Nuclear stress test 09/2014: LVEF 38% with a fixed apical defect. LHC 10/2014: No obstructive coronary disease.  EF on left ventriculography was 55% and LVEDP of 10 mmHg.    Carotid Doppler 07/24/16: >50% L ECA stenosis; 1-39% L ICA stenosis  Echo 07/24/16: LVEF 55-60%.  Restrictive physiology was noted.   Recent Labs: 12/07/2019: ALT 9; TSH 0.620 10/14/2020: BUN 7; Creatinine, Ser 0.96; Hemoglobin 13.3; Platelets 229; Potassium 4.2; Sodium 133    Lipid Panel    Component Value Date/Time   CHOL 147 08/18/2020 1417   TRIG 53 08/18/2020 1417   HDL 76 08/18/2020 1417   CHOLHDL 1.9 08/18/2020 1417   CHOLHDL 3.4 04/26/2017 1403   VLDL 31 (H) 04/26/2017 1403   LDLCALC 60 08/18/2020 1417   LDLDIRECT 183 (H) 03/01/2014 0500      Wt Readings from Last 3 Encounters:  10/16/20 161 lb (73 kg)  10/14/20 162 lb (73.5 kg)  10/14/20 162 lb 3.2 oz (73.6 kg)      ASSESSMENT AND PLAN:  # Chest pain: LHC in 2015 revealed nonobstructive CAD.  She had a stress test in 2017 that was negative.  Given that she has symptoms now that are different than at that time we will repeat a Lexiscan  Myoview.  She will need this prior to her upcoming procedure.  # Hypertension: # PVCs: Susan Holmes to started a new combination medication yesterday.  Therefore will not make any changes at this time.  She has follow-up with her PCP next week, at which time I presume she will have a basic metabolic panel checked  given that HCTZ was added to her regimen.  I did instruct her to stop taking losartan and amlodipine given that valsartan is in her combination tablet.  If her blood pressure remains poorly controlled could consider increasing hydralazine or the dose of her losartan and HCTZ.  # Hyperlipidemia: # Myalgias: She is tolerating rosuvastatin.  Lipids are very well-controlled.  # Tobacco abuse: Susan Holmes is not ready to quit smoking today.  # Stroke:  # Carotid stenosis: Continue aspirin and rosuvastatin.  # Shortness of breath: Stable.  Encouraged exercise.    Current medicines are reviewed at length with the patient today.  The patient does not have concerns regarding medicines.  The following changes have been made:  Start chlorthalidone. Labs/ tests ordered today include:   No orders of the defined types were placed in this encounter.    Disposition:   FU with  C. Oval Linsey, MD, [facility] in 2 months     Signed,  C. Oval Linsey, MD, [facility]  10/16/2020 6:18 PM    [place]

## 2020-10-17 ENCOUNTER — Telehealth: Payer: Self-pay | Admitting: *Deleted

## 2020-10-17 ENCOUNTER — Telehealth: Payer: Medicare Other

## 2020-10-17 MED ORDER — LOSARTAN POTASSIUM 100 MG PO TABS: 100.0000 mg | ORAL_TABLET | Freq: Every day | ORAL | 3 refills | Status: DC

## 2020-10-17 MED ORDER — HYDROCHLOROTHIAZIDE 25 MG PO TABS: 25.0000 mg | ORAL_TABLET | Freq: Every day | ORAL | 3 refills | Status: DC

## 2020-10-17 MED ORDER — AMLODIPINE BESYLATE 10 MG PO TABS: 10.0000 mg | ORAL_TABLET | Freq: Every day | ORAL | 3 refills | Status: DC

## 2020-10-17 NOTE — Telephone Encounter (Signed)
Patient is following up. She would like to confirm that is is to not to take Chlorthalidone. Please call.

## 2020-10-17 NOTE — Telephone Encounter (Signed)
  Chronic Care Management   Outreach Note  10/17/2020 Name: Susan Holmes MRN: [id number] DOB: 09/01/1956  Referred by: [name], MD Reason for referral : Chronic Care Management (IDDM, HTN, HLD,  PVD, left knee and leg pain)   A second unsuccessful telephone outreach was attempted today. The patient was referred to the case management team for assistance with care management and care coordination. Attempting to reach patient to follow up on elevated blood sugars that have resulted in emergency room visits on 10/16 and 10/19. Unable to leave message on either of patient's contact numbers. Per chart review, Dr Marva Panda increased Lantus to 33 units daily and Novolog to 10 units with meals on 10/16/20. Per appointment review, patient is scheduled to be seen in clinic on 10/23/20 at 9:15 am.  Follow Up Plan: The care management team will reach out to the patient again over the next 7-10 days.   Kelli Churn RN, CCM, [facility] RN Care Manager [phone]

## 2020-10-17 NOTE — Telephone Encounter (Signed)
Spoke with pt, patient was told to take HCTZ.

## 2020-10-17 NOTE — Telephone Encounter (Signed)
Discussed Chlorthalidone with patient, no further questions at this time

## 2020-10-17 NOTE — Telephone Encounter (Signed)
BP med sent separately to her pharmacy.

## 2020-10-23 ENCOUNTER — Telehealth: Payer: Self-pay | Admitting: Dietician

## 2020-10-23 ENCOUNTER — Encounter: Payer: Self-pay | Admitting: Internal Medicine

## 2020-10-23 ENCOUNTER — Other Ambulatory Visit: Payer: Self-pay

## 2020-10-23 ENCOUNTER — Ambulatory Visit (INDEPENDENT_AMBULATORY_CARE_PROVIDER_SITE_OTHER): Payer: Medicare Other | Admitting: Internal Medicine

## 2020-10-23 VITALS — BP 134/77 | HR 65 | Temp 98.7°F | Ht 67.0 in | Wt 167.9 lb

## 2020-10-23 DIAGNOSIS — H9190 Unspecified hearing loss, unspecified ear: Secondary | ICD-10-CM

## 2020-10-23 DIAGNOSIS — H903 Sensorineural hearing loss, bilateral: Secondary | ICD-10-CM | POA: Insufficient documentation

## 2020-10-23 DIAGNOSIS — Z0110 Encounter for examination of ears and hearing without abnormal findings: Secondary | ICD-10-CM | POA: Diagnosis not present

## 2020-10-23 DIAGNOSIS — Z794 Long term (current) use of insulin: Secondary | ICD-10-CM

## 2020-10-23 DIAGNOSIS — E119 Type 2 diabetes mellitus without complications: Secondary | ICD-10-CM | POA: Diagnosis not present

## 2020-10-23 DIAGNOSIS — E1165 Type 2 diabetes mellitus with hyperglycemia: Secondary | ICD-10-CM

## 2020-10-23 MED ORDER — FREESTYLE LIBRE 2 READER DEVI: 0 refills | Status: DC

## 2020-10-23 MED ORDER — FREESTYLE LIBRE 2 SENSOR MISC: 3 refills | Status: DC

## 2020-10-23 MED ORDER — INSULIN ASPART 100 UNIT/ML ~~LOC~~ SOLN: 15.0000 [IU] | Freq: Three times a day (TID) | SUBCUTANEOUS | 0 refills | Status: DC

## 2020-10-23 NOTE — Telephone Encounter (Signed)
I called the supplier again about her CGM order. They have all the paperwork. They are checking her eligibility. They should know by Monday if they can service her. This is the second time I called and they told me that. I called [employer] who has worked with some of our other patients with the same insurance and began the order process with them. Request printed and signed order for freestyle libre 2 reader and sensors to fax to them. Faxed information successfully to [employer].   I tried calling this patient. Their voicemail box is not set up. I was unable to leave a message

## 2020-10-23 NOTE — Assessment & Plan Note (Signed)
>>  ASSESSMENT AND PLAN FOR HEARING LOSS WRITTEN ON 10/23/2020  6:20 PM BY Eliezer Bottom, MD  Patient expresses interest in getting audiologic testing for hearing loss. She notes that she has trouble hearing from long distances and often has to ask the speaker to repeat their statement. She denies any associated symptoms. She denies any recent trauma. On examination, no abnormal findings noted at the external ear, ear canal, or tympanic membrane.  Plan: Referral to ENT for audiologic testing

## 2020-10-23 NOTE — Assessment & Plan Note (Signed)
Patient expresses interest in getting audiologic testing for hearing loss. She notes that she has trouble hearing from long distances and often has to ask the speaker to repeat their statement. She denies any associated symptoms. She denies any recent trauma. On examination, no abnormal findings noted at the external ear, ear canal, or tympanic membrane.  Plan: Referral to ENT for audiologic testing

## 2020-10-23 NOTE — Patient Instructions (Addendum)
Susan Holmes,  It was a pleasure seeing you in clinic. Today we discussed:   Diabetes: At this time, continue taking Lantus 33 units daily. Increase NOVOLOG to 15 UNITS THREE TIMES DAILY WITH MEALS. Continue checking your blood sugars 4 times daily - when you wake up, and with every meal. Follow up in 2 weeks. Please bring your meter to the next appointment   Hearing: I will send a referral to the Ear, [place], Throat doctors. They will contact you to schedule an appointment for your hearing test.    If you have any questions or concerns, please call our clinic at [phone] between 9am-5pm and after hours call [phone] and ask for the internal medicine resident on call. If you feel you are having a medical emergency please call 911.   Thank you, we look forward to helping you remain healthy!

## 2020-10-23 NOTE — Assessment & Plan Note (Signed)
Susan Holmes is following up for diabetes at this visit. At previous visit, patient noted to have elevated to 363 with ketonuria on UA dipstick. Patient was sent to the ED for concerns of possible DKA. Work up was negative for hyperglycemic crisis in the ED. She was advised to increase her Lantus to 33 units and start Novolog 10U tid with meals.  Today, patient is following up for her hyperglyemia. She did not bring her meter with her but notes persistently elevated glucose to 200's. She denies any hypoglycemic episodes and notes her lowest CBG in 150s. Patient would benefit from tighter glucose control at this time   Plan: - Continue Lantus 33 Units - Increase Novolog to 15 Units TID with meals - She will continue to work with [name], diabetes coordinator, to obtain CGM so we can more accurately assess her CBGs and titrate insulin accordingly - F/u in 2 weeks for CBG monitoring and insulin titration

## 2020-10-23 NOTE — Progress Notes (Signed)
   CC: diabetes follow up   HPI:  Susan Holmes is a 64 y.o. female with PMHx as stated below presenting for diabetes follow up. Patient did not bring glucose meter to this visit but notes that her CBGs have been in 200's with the lowest being 130. She is also requesting audiologic testing as she is endorsing some trouble with hearing from far distances. Please see problem based charting for complete assessment and plan.  Past Medical History:  Diagnosis Date  . Anxiety   . Arthritis   . Chest pain 09/26/2018  . Congenital blindness    Left  eye  . Coronary artery disease    per cardiac cath 2015-- mild disease involving LAD and branches ,  LCFx and branches, and mild disease pRCA  . Depression   . Diabetes mellitus, type II ([place])    followed by pcp  . Glaucoma   . Glaucoma, both eyes   . Hepatitis B infection pt unsure   resolved  . History of kidney stones    right stone  . History of transient ischemic attack (TIA) 2017   per pt no residuals  . HTN (hypertension)    followed by pcp  . Hyperlipidemia   . PAD (peripheral artery disease) ([place])   . Poor dental hygiene   . Stroke ([place])   . Tobacco abuse    .5 PPD smoker   Review of Systems:  Negative except as stated in HPI.   Physical Exam:  Vitals:   10/23/20 0911  BP: 134/77  Pulse: 65  Temp: 98.7 F (37.1 C)  TempSrc: Oral  SpO2: 97%  Weight: 167 lb 14.4 oz (76.2 kg)  Height: 5\' 7"  (1.702 m)   Physical Exam  Constitutional: Appears well-developed and well-nourished. No distress.  HENT: Normocephalic and atraumatic, EOMI,  moist mucous membranes; external ears without any lesion noted; ear canal with wax however, no lesions noted; tympanic membrane wnl; does have small posterior auricular lymphadenopathy - non tender  Cardiovascular: Normal rate, regular rhythm, S1 and S2 present, no murmurs, rubs, gallops.  Distal pulses intact Respiratory: No respiratory distress, no accessory muscle use.  Effort is  normal.  Lungs are clear to auscultation bilaterally. GI: Nondistended, soft, nontender to palpation, normal active bowel sounds Skin: Warm and dry.  No rash, erythema, lesions noted. Psychiatric: Normal mood and affect. Behavior is normal. Judgment and thought content normal.    Assessment & Plan:   See Encounters Tab for problem based charting.  Patient discussed with Dr. Evette Doffing

## 2020-10-24 ENCOUNTER — Telehealth: Payer: Self-pay | Admitting: *Deleted

## 2020-10-24 ENCOUNTER — Telehealth: Payer: Medicare Other

## 2020-10-24 NOTE — Progress Notes (Signed)
Internal Medicine Clinic Attending ° °Case discussed with Dr. Aslam  At the time of the visit.  We reviewed the resident’s history and exam and pertinent patient test results.  I agree with the assessment, diagnosis, and plan of care documented in the resident’s note.  °

## 2020-10-24 NOTE — Telephone Encounter (Signed)
  Chronic Care Management   Outreach Note  10/24/2020 Name: Susan Holmes MRN: [id number] DOB: [birth year]  Referred by: [name], MD Reason for referral : Chronic Care Management (IDDM, HTN, HLD,  PVD, left knee and leg pain)   A second unsuccessful telephone outreach was attempted today. The patient was referred to the case management team for assistance with care management and care coordination.   Follow Up Plan: A HIPAA compliant phone message was left for the patient providing contact information and requesting a return call.  The care management team will reach out to the patient again over the next 7-10 days.   Kelli Churn RN, CCM, [facility] RN Care Manager [phone]

## 2020-10-25 ENCOUNTER — Ambulatory Visit: Payer: Medicare Other | Attending: Internal Medicine

## 2020-10-25 DIAGNOSIS — Z23 Encounter for immunization: Secondary | ICD-10-CM

## 2020-10-28 ENCOUNTER — Ambulatory Visit: Payer: Medicare Other | Admitting: *Deleted

## 2020-10-28 DIAGNOSIS — I10 Essential (primary) hypertension: Secondary | ICD-10-CM

## 2020-10-28 DIAGNOSIS — E1169 Type 2 diabetes mellitus with other specified complication: Secondary | ICD-10-CM

## 2020-10-28 DIAGNOSIS — E1165 Type 2 diabetes mellitus with hyperglycemia: Secondary | ICD-10-CM

## 2020-10-28 DIAGNOSIS — E785 Hyperlipidemia, unspecified: Secondary | ICD-10-CM

## 2020-10-28 NOTE — Chronic Care Management (AMB) (Signed)
Chronic Care Management   Follow Up Note   10/28/2020 Name: Susan Holmes MRN: [id number] DOB: 1956/07/21  Referred by: [name], MD Reason for referral : Chronic Care Management ( IDDM, HTN, HLD,  PVD, left knee and leg pain)   Susan Holmes is a 64 y.o. year old female who is a primary care patient of Aslam, Loralyn Freshwater, MD. The CCM team was consulted for assistance with chronic disease management and care coordination needs.    Review of patient status, including review of consultants reports, relevant laboratory and other test results, and collaboration with appropriate care team members and the patient's provider was performed as part of comprehensive patient evaluation and provision of chronic care management services.    SDOH (Social Determinants of Health) assessments performed: No See Care Plan activities for detailed interventions related to [facility])     Outpatient Encounter Medications as of 10/28/2020  Medication Sig Note  . allopurinol (ZYLOPRIM) 100 MG tablet Take 1 tablet (100 mg total) by mouth daily.   Marland Kitchen amLODipine (NORVASC) 10 MG tablet Take 1 tablet (10 mg total) by mouth daily.   Marland Kitchen aspirin EC 81 MG tablet Take 1 tablet (81 mg total) by mouth daily.   . Blood Glucose Monitoring Suppl (ACCU-CHEK GUIDE ME) w/Device KIT Check blood sugar 3 times a day   . busPIRone (BUSPAR) 7.5 MG tablet Take 7.5 mg by mouth 2 (two) times daily.  10/11/2020: Patient taking only 2 times daily  . carvedilol (COREG) 12.5 MG tablet TAKE 1 TABLET(12.5 MG) BY MOUTH TWICE DAILY   . Continuous Blood Gluc Receiver (FREESTYLE LIBRE 2 READER) DEVI Use to check blood sugar at least 6 times a day   . Continuous Blood Gluc Sensor (FREESTYLE LIBRE 2 SENSOR) MISC Use to check blood glucose at least 6 times a day   . diclofenac Sodium (VOLTAREN) 1 % GEL Apply 2 g topically daily as needed (pain).    . dorzolamide-timolol (COSOPT) 22.3-6.8 MG/ML ophthalmic solution Place 1 drop into both eyes every evening.    . Dulaglutide (TRULICITY) 1.5 SX/1.1BZ SOPN ADMINISTER 1.5 MG UNDER THE SKIN EVERY SATURDAY   . DULoxetine (CYMBALTA) 30 MG capsule TAKE 2 CAPSULE(60 MG) BY MOUTH DAILY (Patient taking differently: Take 60 mg by mouth daily. )   . fluticasone (FLONASE) 50 MCG/ACT nasal spray Place 1 spray into both nostrils daily. (Patient taking differently: Place 1 spray into both nostrils daily as needed for allergies. )   . gabapentin (NEURONTIN) 300 MG capsule Take 300 mg by mouth 3 (three) times daily.   Marland Kitchen glucose blood (ACCU-CHEK GUIDE) test strip Check blood sugar 3 times per day   . hydrALAZINE (APRESOLINE) 10 MG tablet Take 10 mg by mouth in the morning and at bedtime.    . hydrochlorothiazide (HYDRODIURIL) 25 MG tablet Take 1 tablet (25 mg total) by mouth daily.   . hydrOXYzine (ATARAX/VISTARIL) 25 MG tablet Take 25 mg by mouth 3 (three) times daily as needed.   . insulin aspart (NOVOLOG) 100 UNIT/ML injection Inject 15 Units into the skin 3 (three) times daily with meals.   . insulin glargine (LANTUS SOLOSTAR) 100 UNIT/ML Solostar Pen Inject 33 Units into the skin at bedtime.   . Insulin Pen Needle (VALUMARK PEN NEEDLES) 31G X 8 MM MISC 1 each by Does not apply route daily.   . Lancets (ACCU-CHEK SOFT TOUCH) lancets Check blood sugar 3 times a day   . lidocaine (XYLOCAINE) 5 % ointment Apply 1 application topically  daily as needed for moderate pain.    Marland Kitchen LINZESS 145 MCG CAPS capsule Take 145 mcg by mouth daily.   Marland Kitchen losartan (COZAAR) 100 MG tablet Take 1 tablet (100 mg total) by mouth daily.   . mirtazapine (REMERON) 45 MG tablet Take 1 tablet (45 mg total) by mouth at bedtime.   Marland Kitchen NARCAN 4 MG/0.1ML LIQD nasal spray kit Place 1 spray into the nose once.  10/11/2020: Never used  . oxyCODONE (ROXICODONE) 15 MG immediate release tablet Take 15 mg by mouth every other day as needed for pain.    . polyethylene glycol powder (GLYCOLAX/MIRALAX) 17 GM/SCOOP powder SMARTSIG:1 Scoopful By Mouth 4 Times Daily  (Patient taking differently: Take 1 Container by mouth daily. SMARTSIG:1 Scoopful By Mouth 4 Times Daily)   . prednisoLONE acetate (PRED FORTE) 1 % ophthalmic suspension Place 1 drop into the right eye daily.   . pregabalin (LYRICA) 150 MG capsule Take 150 mg by mouth daily.    . rosuvastatin (CRESTOR) 20 MG tablet Take 1 tablet (20 mg total) by mouth at bedtime. NEED OV.   Marland Kitchen tiZANidine (ZANAFLEX) 4 MG capsule Take 4 mg by mouth 3 (three) times daily as needed for muscle spasms.    . traZODone (DESYREL) 100 MG tablet Take 300 mg by mouth at bedtime.    No facility-administered encounter medications on file as of 10/28/2020.     Objective:  Wt Readings from Last 3 Encounters:  10/23/20 167 lb 14.4 oz (76.2 kg)  10/16/20 161 lb (73 kg)  10/14/20 162 lb (73.5 kg)   BP Readings from Last 3 Encounters:  10/23/20 134/77  10/16/20 (!) 140/92  10/15/20 (!) 156/92   Lab Results  Component Value Date   HGBA1C 6.7 (A) 08/18/2020   HGBA1C 6.5 (A) 02/06/2020   HGBA1C 10.2 (A) 10/10/2019   Lab Results  Component Value Date   MICROALBUR 31.8 (H) 11/24/2017   LDLCALC 60 08/18/2020   CREATININE 0.96 10/14/2020   Lab Results  Component Value Date   CHOL 147 08/18/2020   HDL 76 08/18/2020   LDLCALC 60 08/18/2020   LDLDIRECT 183 (H) 03/01/2014   TRIG 53 08/18/2020   CHOLHDL 1.9 08/18/2020    Goals Addressed              This Visit's Progress     Patient Stated   .  " I just talked to my doctor today and yesterday about my blood pressure today because it's high again." (pt-stated)        CARE PLAN ENTRY (see longitudinal plan of care for additional care plan information)   Current Barriers:  Marland Kitchen Knowledge Deficits related to basic understanding of hypertension pathophysiology and self care management . Knowledge Deficits related to understanding of medications prescribed for management of hypertension- successful outreach to patient to complete follow up assessment, patient states  her blood sugars are "up and down" from low 300s to 140 in the last 2 days, she also reports her blood pressure is running high at times, says she doesn't feel well when her blood pressure and blood sugar are variable, asked when she should take glucose tablets, , she reports good medication taking behavior although she did not remember that she was to increase her mealtime Novolog to 15 units , she is requesting anther [place] [facility] [facility] day planner so she can keep her appointments straight  Case Manager Clinical Goal(s):  Marland Kitchen Over the next 30- 60 days, patient will verbalize understanding  of plan for hypertension management . Over the next 30 - 60 days, patient will attend all scheduled medical appointments:  . Over the next 30 -60 days, patient will demonstrate improved adherence to prescribed treatment plan for hypertension as evidenced by taking all medications as prescribed, monitoring and recording blood pressure as directed, adhering to low sodium/DASH diet . Over the next 30 - 60 days, patient will demonstrate improved health management independence as evidenced by checking blood pressure as directed and notifying PCP if SBP>200 or DBP > 100, taking all medications as prescribe, and adhering to a low sodium diet as discussed.  Interventions:  . Appropriate assessments completed . Assessed medication taking behavior . Reviewed DM medications including changes to Lantus dose to 33 units and Novolog to 15 units per Dr Margy Clarks clinic note of 10/23/20 . Assessed home blood sugar readings and hypoglycemic events . Reviewed definition of hypoglycemia, symptoms and treatment including use of glucose tablets . Reviewed plan to secure CGM for patient per Alanson Aly  (clinic RD and CDCES) note of 10/23/20 . Assessed home BP readings, reviewed blood pressure targets- encouraged her to be consistent in taking her blood pressure everyday and recording readings since her treatment  regimen was changed today . Advised her to call clinic provider if her blood pressure readings are consistently >140/>90 of blood sugars consistently >200 . Per patient request,  mailed a [place] [facility] spiral bound calendar with self management education on HTN and DM and with business cards of CCM RN and CCM BSW to patient's home address   Patient Self Care Activities:  . Able to independently manage blood pressure to meet target of <140/<90 and to manage blood sugar to meet Hgb A1C target . Self administers medications as prescribed . Attends all scheduled provider appointments . Calls provider office for new concerns, questions, or BP outside discussed parameters or seeks emergency assistance for symptoms of stroke . Checks BP and record as discussed . Checks blood sugar at least daily and calls provider for readings outside established parameters . Follows a low sodium diet/DASH diet . Eliminate energy drinks  Please see past updates related to this goal by clicking on the "Past Updates" button in the selected goal         Plan:   The care management team will reach out to the patient again over the next 30-60 days.    Kelli Churn RN, CCM, [facility] RN Care Manager [phone]

## 2020-10-28 NOTE — Patient Instructions (Signed)
Visit Information It was nice speaking with you today. Please increase your mealtime Novolog insulin to 15 units.  Please call for a clinic appointment if your blood pressure and blood sugar continue to be variable.  Goals Addressed              This Visit's Progress     Patient Stated   .  " I just talked to my doctor today and yesterday about my blood pressure today because it's high again." (pt-stated)        CARE PLAN ENTRY (see longitudinal plan of care for additional care plan information)   Current Barriers:  Marland Kitchen Knowledge Deficits related to basic understanding of hypertension pathophysiology and self care management . Knowledge Deficits related to understanding of medications prescribed for management of hypertension- successful outreach to patient to complete follow up assessment, patient states her blood sugars are "up and down" from low 300s to 140 in the last 2 days, she also reports her blood pressure is running high at times, says she doesn't feel well when her blood pressure and blood sugar are variable, asked when she should take glucose tablets, , she reports good medication taking behavior although she did not remember that she was to increase her mealtime Novolog to 15 units , she is requesting anther [place] [facility] day planner so she can keep her appointments straight  Case Manager Clinical Goal(s):  Marland Kitchen Over the next 30- 60 days, patient will verbalize understanding of plan for hypertension management . Over the next 30 - 60 days, patient will attend all scheduled medical appointments:  . Over the next 30 -60 days, patient will demonstrate improved adherence to prescribed treatment plan for hypertension as evidenced by taking all medications as prescribed, monitoring and recording blood pressure as directed, adhering to low sodium/DASH diet . Over the next 30 - 60 days, patient will demonstrate improved health management independence as evidenced  by checking blood pressure as directed and notifying PCP if SBP>200 or DBP > 100, taking all medications as prescribe, and adhering to a low sodium diet as discussed.  Interventions:  . Appropriate assessments completed . Assessed medication taking behavior . Reviewed DM medications including changes to Lantus dose to 33 units and Novolog to 15 units per Dr Margy Clarks clinic note of 10/23/20 . Assessed home blood sugar readings and hypoglycemic events . Reviewed definition of hypoglycemia, symptoms and treatment including use of glucose tablets . Reviewed plan to secure CGM for patient per Alanson Aly  (clinic RD and CDCES) note of 10/23/20 . Assessed home BP readings, reviewed blood pressure targets- encouraged her to be consistent in taking her blood pressure everyday and recording readings since her treatment regimen was changed today . Advised her to call clinic provider if her blood pressure readings are consistently >140/>90 of blood sugars consistently >200 . Per patient request,  mailed a [facility] spiral bound calendar with self management education on HTN and DM and with business cards of CCM RN and CCM BSW to patient's home address   Patient Self Care Activities:  . Able to independently manage blood pressure to meet target of <140/<90 and to manage blood sugar to meet Hgb A1C target . Self administers medications as prescribed . Attends all scheduled provider appointments . Calls provider office for new concerns, questions, or BP outside discussed parameters or seeks emergency assistance for symptoms of stroke . Checks BP and record as discussed . Checks blood sugar at  least daily and calls provider for readings outside established parameters . Follows a low sodium diet/DASH diet . Eliminate energy drinks  Please see past updates related to this goal by clicking on the "Past Updates" button in the selected goal        The patient verbalized  understanding of instructions provided today and declined a print copy of patient instruction materials.   The care management team will reach out to the patient again over the next 30-60 days.   Kelli Churn RN, CCM, [facility] RN Care Manager [phone]

## 2020-10-29 NOTE — Progress Notes (Signed)
Internal Medicine Clinic Resident  I have personally reviewed this encounter including the documentation in this note and/or discussed this patient with the care management provider. I will address any urgent items identified by the care management provider and will communicate my actions to the patient's PCP. I have reviewed the patient's CCM visit with my supervising attending, Dr Hoffman.  Marlena Barbato M Sargon Scouten, MD 10/29/2020    

## 2020-11-03 ENCOUNTER — Telehealth: Payer: Self-pay

## 2020-11-03 NOTE — Telephone Encounter (Signed)
Patient called c/o painful penny size knot of side of leg. Says her foot is swollen and her leg is painful but has been that way since surgery in 2020. The only thing that has changed is the appearance of the knot. Denies any color changes, says her left leg is a little cooler, but it has been that way since 2020. She had a referral to orthopedics in March. She is unsure if she saw an orthopedist. She has a recall for the office in December, so we scheduled her for that follow up visit.

## 2020-11-05 NOTE — Progress Notes (Signed)
Internal Medicine Clinic Attending  CCM services provided by the care management provider and their documentation were discussed with Dr. Krienke. We reviewed the pertinent findings, urgent action items addressed by the resident and non-urgent items to be addressed by the PCP.  I agree with the assessment, diagnosis, and plan of care documented in the CCM and resident's note.  Anntonette Madewell C Jalonda Antigua, DO 11/05/2020  

## 2020-11-07 ENCOUNTER — Encounter: Payer: Self-pay | Admitting: Dietician

## 2020-11-07 ENCOUNTER — Encounter: Payer: Self-pay | Admitting: Internal Medicine

## 2020-11-07 ENCOUNTER — Ambulatory Visit (INDEPENDENT_AMBULATORY_CARE_PROVIDER_SITE_OTHER): Payer: Medicare Other | Admitting: Internal Medicine

## 2020-11-07 ENCOUNTER — Telehealth: Payer: Self-pay | Admitting: Dietician

## 2020-11-07 ENCOUNTER — Other Ambulatory Visit: Payer: Self-pay

## 2020-11-07 DIAGNOSIS — E119 Type 2 diabetes mellitus without complications: Secondary | ICD-10-CM | POA: Diagnosis not present

## 2020-11-07 DIAGNOSIS — Z794 Long term (current) use of insulin: Secondary | ICD-10-CM

## 2020-11-07 NOTE — Telephone Encounter (Signed)
Patient called for her personal CGM order. She says [employer] is waiting for a form from our office and it is covered.

## 2020-11-07 NOTE — Assessment & Plan Note (Addendum)
[name] is here to follow-up in regards to diabetes; at her last visit she was instructed to increase her NovoLog to 15 units 3 times daily with meals and to continue with Lantus 33 units before bedtime.  The plan was to also pursue CGM monitoring.  Today, [name] states that she has been doing well with her new regimen.  She notes only 1 moment of hypoglycemia at which time she felt ill, shaky.  She was able to eat something and felt better.  She notes that she always eats 3 meals a day and is aware that if she skips a meal to also skip her NovoLog.  She brought her meter with her today, however unfortunately it is unable to be downloaded by her computers.  Assessment/plan: On evaluation of her glucometer, it looks like that date and time are incorrect.  Off the top of her head, [name] is unable to recall what actual time she took each of the readings.  Due to this, unable to titrate her insulin any further at this time.  Plan to follow-up in 1 month at which time hopefully patient will have CGM set up.   -Adjusted glucometer time and date today -Continue Lantus 33 units before bedtime and NovoLog 15 units 3 times daily with meals -Continue Trulicity -1 month follow-up

## 2020-11-07 NOTE — Progress Notes (Signed)
   CC: T2DM  HPI:  [name] is a 64 y.o. with a PMHx as listed below who presents to the clinic for T2Dm follow up.   Please see the Encounters tab for problem-based Assessment & Plan regarding status of patient's acute and chronic conditions.  Past Medical History:  Diagnosis Date  . Anxiety   . Arthritis   . Callus of foot 09/19/2018  . Chest pain 09/26/2018  . Claudication ([place]) 09/14/2019  . Congenital blindness    Left  eye  . Coronary artery disease    per cardiac cath 2015-- mild disease involving LAD and branches ,  LCFx and branches, and mild disease pRCA  . Cough 07/08/2016  . Depression   . Diabetes mellitus, type II ([place])    followed by pcp  . Glaucoma   . Glaucoma, both eyes   . Hepatitis B infection pt unsure   resolved  . History of kidney stones    right stone  . History of transient ischemic attack (TIA) 2017   per pt no residuals  . History of vitamin D deficiency 09/06/2017  . HTN (hypertension)    followed by pcp  . Hyperlipidemia   . PAD (peripheral artery disease) ([place])   . Paronychia of great toe, left 06/21/2019  . Poor dental hygiene   . Stroke ([place])   . Tobacco abuse    .5 PPD smoker   Review of Systems: Review of Systems  Neurological: Negative for dizziness, tingling, focal weakness and headaches.   Physical Exam:  Vitals:   11/07/20 0950  BP: 133/74  Pulse: 77  Temp: 98.2 F (36.8 C)  TempSrc: Oral  SpO2: 99%  Weight: 171 lb 1.6 oz (77.6 kg)  Height: 5\' 7"  (1.702 m)    Physical Exam Vitals and nursing note reviewed.  Constitutional:      General: She is not in acute distress.    Appearance: She is normal weight.  HENT:     Head: Normocephalic and atraumatic.  Pulmonary:     Effort: Pulmonary effort is normal. No respiratory distress.  Neurological:     General: No focal deficit present.     Mental Status: She is alert and oriented to person, place, and time. Mental status is at baseline.     Gait: Gait normal.    Psychiatric:        Mood and Affect: Mood normal.        Behavior: Behavior normal.    Assessment & Plan:   See Encounters Tab for problem based charting.  Patient discussed with Dr. Philipp Ovens

## 2020-11-07 NOTE — Progress Notes (Signed)
Internal Medicine Clinic Attending  Case discussed with Dr. Basaraba  At the time of the visit.  We reviewed the resident's history and exam and pertinent patient test results.  I agree with the assessment, diagnosis, and plan of care documented in the resident's note.  

## 2020-11-13 ENCOUNTER — Ambulatory Visit (INDEPENDENT_AMBULATORY_CARE_PROVIDER_SITE_OTHER)
Admission: RE | Admit: 2020-11-13 | Discharge: 2020-11-13 | Disposition: A | Discharge status: Home / Self Care | Payer: Medicare Other | Source: Ambulatory Visit | Attending: Physician Assistant | Admitting: Physician Assistant

## 2020-11-13 ENCOUNTER — Ambulatory Visit (INDEPENDENT_AMBULATORY_CARE_PROVIDER_SITE_OTHER): Payer: Medicare Other | Admitting: Physician Assistant

## 2020-11-13 ENCOUNTER — Ambulatory Visit (HOSPITAL_COMMUNITY)
Admission: RE | Admit: 2020-11-13 | Discharge: 2020-11-13 | Disposition: A | Discharge status: Home / Self Care | Payer: Medicare Other | Source: Ambulatory Visit | Attending: Physician Assistant | Admitting: Physician Assistant

## 2020-11-13 ENCOUNTER — Other Ambulatory Visit: Payer: Self-pay

## 2020-11-13 VITALS — BP 122/87 | HR 96 | Temp 97.2°F | Resp 20 | Ht 67.0 in | Wt 162.3 lb

## 2020-11-13 DIAGNOSIS — I779 Disorder of arteries and arterioles, unspecified: Secondary | ICD-10-CM

## 2020-11-13 DIAGNOSIS — I739 Peripheral vascular disease, unspecified: Secondary | ICD-10-CM

## 2020-11-13 NOTE — Progress Notes (Signed)
HISTORY AND PHYSICAL     CC:  follow up. Requesting Provider:  [name], MD  HPI: This is a 64 y.o. female who is here today for follow up for PAD.  She has hx of left femoral to AK popliteal bypass grafting with reversed ipsilateral GSV by Dr. Oneida Alar on 10/22/2019.  She was seen at her post op visit in November and at that time she had pain at the left groin incision.  She was seen back in December and at that time had a small dehiscence of the left groin incision of ~ 1cm but tissue was healthy and had some granulation tissues.  She had palpable DP pulses bilaterally.  She was still smoking at that point.  She was to follow up in a couple of weeks for a wound check but that appt did not happen.    Pt was last seen 03/06/2020 and at that time, she did not have any pain in her legs with walking or pain in her feet.  She did have some left knee pain medially.  She did not have any non healing wounds.  She was scheduled to come back in 9 months and here today for that visit.  She was scheduled to go to orthopedics for her knee issues and she was seen by Dr. Marlou Sa in August.  She underwent DVT study and that was negative.  Plain radiographs were negative and she was scheduled for MRI of lumbar spine to evaluate source of possible left sided radiculopathy and follow up with ortho.  I do not see where that happened.   The pt returns today for follow up.  She states she still has pain and numbness at the medial aspect of her left knee.  She states that her left leg is painful to sleep on.  She does not have pain in her left foot at rest.  She states she has a couple of small knots on the anterior portion of her thigh.  She states that her leg has been swelling since surgery.  She states that it really doesn't get better after being in bed at night.    She has a chihuahua named Paco.   The pt is on a statin for cholesterol management.    The pt is on an aspirin.    Other AC:  none The pt is on CCB, BB,  ARB for hypertension.  The pt does have diabetes. Tobacco hx:  current  Pt does not have family hx of AAA.  Past Medical History:  Diagnosis Date  . Anxiety   . Arthritis   . Callus of foot 09/19/2018  . Chest pain 09/26/2018  . Claudication ([place]) 09/14/2019  . Congenital blindness    Left  eye  . Coronary artery disease    per cardiac cath 2015-- mild disease involving LAD and branches ,  LCFx and branches, and mild disease pRCA  . Cough 07/08/2016  . Depression   . Diabetes mellitus, type II ([place])    followed by pcp  . Glaucoma   . Glaucoma, both eyes   . Hepatitis B infection pt unsure   resolved  . History of kidney stones    right stone  . History of transient ischemic attack (TIA) 2017   per pt no residuals  . History of vitamin D deficiency 09/06/2017  . HTN (hypertension)    followed by pcp  . Hyperlipidemia   . PAD (peripheral artery disease) ([place])   . Paronychia of  great toe, left 06/21/2019  . Poor dental hygiene   . Stroke ([place])   . Tobacco abuse    .5 PPD smoker  . Uncontrolled type II diabetes mellitus ([place]) 06/12/2007   Qualifier: Diagnosis of  By: Dr[place]ard MSN, FNP-C, Collie Siad      Past Surgical History:  Procedure Laterality Date  . ABDOMINAL AORTOGRAM W/LOWER EXTREMITY N/A 09/14/2019   Procedure: ABDOMINAL AORTOGRAM W/LOWER EXTREMITY;  Surgeon: Elam Dutch, MD;  Location: [facility];  Service: Cardiovascular;  Laterality: N/A;  . ABDOMINAL HYSTERECTOMY  1980s   unsure if ovaries were taken  . BYPASS GRAFT Left 10/22/2019    Left femoral to above-knee popliteal bypass using reversed ipsilateral greater saphenous vein  . colonoscopy    . FEMORAL-POPLITEAL BYPASS GRAFT Left 10/22/2019   Procedure: LEFT FEMORAL-ABOVE KNEE POPLITEAL  BYPASS GRAFT with REVERSED GREATER SAPHENOUS VEIN;  Surgeon: Elam Dutch, MD;  Location: [facility];  Service: Vascular;  Laterality: Left;  . GLAUCOMA SURGERY Right 2017  . IR URETERAL STENT RIGHT NEW ACCESS W/O SEP  NEPHROSTOMY CATH  10/03/2018  . LEFT HEART CATHETERIZATION WITH CORONARY ANGIOGRAM N/A 10/09/2014   Procedure: LEFT HEART CATHETERIZATION WITH CORONARY ANGIOGRAM;  Surgeon: Troy Sine, MD;  Location: [facility] CATH LAB;  Service: Cardiovascular;  Laterality: N/A;  . NEPHROLITHOTOMY Right 10/03/2018   Procedure: RIGHT NEPHROLITHOTOMY PERCUTANEOUS FIRST STAGE;  Surgeon: Irine Seal, MD;  Location: [facility];  Service: Urology;  Laterality: Right;  . PERIPHERAL VASCULAR INTERVENTION Left 09/14/2019   Procedure: PERIPHERAL VASCULAR INTERVENTION;  Surgeon: Elam Dutch, MD;  Location: [facility];  Service: Cardiovascular;  Laterality: Left;   left external iliac stent   . TEE WITHOUT CARDIOVERSION N/A 08/17/2016   Procedure: TRANSESOPHAGEAL ECHOCARDIOGRAM (TEE);  Surgeon: Sueanne Margarita, MD;  Location: [facility];  Service: Cardiovascular;  Laterality: N/A;    Allergies  Allergen Reactions  . Ace Inhibitors Cough  . Lipitor [Atorvastatin] Other (See Comments)    MYALGIA   . Chantix [Varenicline] Nausea And Vomiting    Current Outpatient Medications  Medication Sig Dispense Refill  . allopurinol (ZYLOPRIM) 100 MG tablet Take 1 tablet (100 mg total) by mouth daily. 90 tablet 1  . amLODipine (NORVASC) 10 MG tablet Take 1 tablet (10 mg total) by mouth daily. 90 tablet 3  . aspirin EC 81 MG tablet Take 1 tablet (81 mg total) by mouth daily. 90 tablet 1  . Blood Glucose Monitoring Suppl (ACCU-CHEK GUIDE ME) w/Device KIT Check blood sugar 3 times a day 1 kit 0  . busPIRone (BUSPAR) 7.5 MG tablet Take 7.5 mg by mouth 2 (two) times daily.     . carvedilol (COREG) 12.5 MG tablet TAKE 1 TABLET(12.5 MG) BY MOUTH TWICE DAILY 60 tablet 6  . Continuous Blood Gluc Receiver (FREESTYLE LIBRE 2 READER) DEVI Use to check blood sugar at least 6 times a day 1 each 0  . Continuous Blood Gluc Sensor (FREESTYLE LIBRE 2 SENSOR) MISC Use to check blood glucose at least 6 times a day 7 each 3  . diclofenac Sodium  (VOLTAREN) 1 % GEL Apply 2 g topically daily as needed (pain).     . dorzolamide-timolol (COSOPT) 22.3-6.8 MG/ML ophthalmic solution Place 1 drop into both eyes every evening. 10 mL 1  . Dulaglutide (TRULICITY) 1.5 OZ/3.0QM SOPN ADMINISTER 1.5 MG UNDER THE SKIN EVERY SATURDAY 6 mL 1  . DULoxetine (CYMBALTA) 30 MG capsule TAKE 2 CAPSULE(60 MG) BY MOUTH DAILY (Patient taking differently:  Take 60 mg by mouth daily. ) 90 capsule 2  . fluticasone (FLONASE) 50 MCG/ACT nasal spray Place 1 spray into both nostrils daily. (Patient taking differently: Place 1 spray into both nostrils daily as needed for allergies. ) 18.2 mL 0  . gabapentin (NEURONTIN) 300 MG capsule Take 300 mg by mouth 3 (three) times daily.    Marland Kitchen glucose blood (ACCU-CHEK GUIDE) test strip Check blood sugar 3 times per day 100 each 12  . hydrALAZINE (APRESOLINE) 10 MG tablet Take 10 mg by mouth in the morning and at bedtime.     . hydrochlorothiazide (HYDRODIURIL) 25 MG tablet Take 1 tablet (25 mg total) by mouth daily. 90 tablet 3  . hydrOXYzine (ATARAX/VISTARIL) 25 MG tablet Take 25 mg by mouth 3 (three) times daily as needed.    . insulin aspart (NOVOLOG) 100 UNIT/ML injection Inject 15 Units into the skin 3 (three) times daily with meals. 13.5 mL 0  . insulin glargine (LANTUS SOLOSTAR) 100 UNIT/ML Solostar Pen Inject 33 Units into the skin at bedtime. 15 mL 1  . Insulin Pen Needle (VALUMARK PEN NEEDLES) 31G X 8 MM MISC 1 each by Does not apply route daily. 100 each 3  . Lancets (ACCU-CHEK SOFT TOUCH) lancets Check blood sugar 3 times a day 100 each 12  . lidocaine (XYLOCAINE) 5 % ointment Apply 1 application topically daily as needed for moderate pain.     Marland Kitchen LINZESS 145 MCG CAPS capsule Take 145 mcg by mouth daily.    Marland Kitchen losartan (COZAAR) 100 MG tablet Take 1 tablet (100 mg total) by mouth daily. 90 tablet 3  . mirtazapine (REMERON) 45 MG tablet Take 1 tablet (45 mg total) by mouth at bedtime. 30 tablet 1  . NARCAN 4 MG/0.1ML LIQD nasal  spray kit Place 1 spray into the nose once.     Marland Kitchen oxyCODONE (ROXICODONE) 15 MG immediate release tablet Take 15 mg by mouth every other day as needed for pain.     . polyethylene glycol powder (GLYCOLAX/MIRALAX) 17 GM/SCOOP powder SMARTSIG:1 Scoopful By Mouth 4 Times Daily (Patient taking differently: Take 1 Container by mouth daily. SMARTSIG:1 Scoopful By Mouth 4 Times Daily) 255 g 1  . prednisoLONE acetate (PRED FORTE) 1 % ophthalmic suspension Place 1 drop into the right eye daily. 5 mL 1  . pregabalin (LYRICA) 150 MG capsule Take 150 mg by mouth daily.     . rosuvastatin (CRESTOR) 20 MG tablet Take 1 tablet (20 mg total) by mouth at bedtime. NEED OV. 30 tablet 2  . tiZANidine (ZANAFLEX) 4 MG capsule Take 4 mg by mouth 3 (three) times daily as needed for muscle spasms.     . traZODone (DESYREL) 100 MG tablet Take 300 mg by mouth at bedtime.     No current facility-administered medications for this visit.    Family History  Problem Relation Age of Onset  . Heart disease Mother 66  . Hypertension Mother   . CVA Mother   . Heart disease Father 82  . CVA Sister   . Heart disease Sister   . Aneurysm Sister   . CVA Brother   . Aneurysm Brother   . CVA Maternal Grandmother   . CVA Maternal Grandfather   . Hypertension Brother   . Colon cancer Neg Hx   . Esophageal cancer Neg Hx   . Stomach cancer Neg Hx     Social History   Socioeconomic History  . Marital status: Divorced    Spouse  name: Not on file  . Number of children: 1  . Years of education: 22  . Highest education level: Not on file  Occupational History  . Occupation: Disabled  Tobacco Use  . Smoking status: Current Every Day Smoker    Packs/day: 0.50    Years: 39.00    Pack years: 19.50    Types: Cigarettes    Start date: 26  . Smokeless tobacco: Current User    Types: Chew  . Tobacco comment: 6 cigs per day  Vaping Use  . Vaping Use: Never used  Substance and Sexual Activity  . Alcohol use: No     Alcohol/week: 0.0 standard drinks  . Drug use: No  . Sexual activity: Not Currently    Birth control/protection: Surgical  Other Topics Concern  . Not on file  Social History Narrative   Current Social History 08/18/2020        Patient lives alone in a ground floor apartment which is 1 story. There are 13 steps with handrail up to the entrance the patient uses.       Patient's method of transportation is [facility] transportation.      The highest level of education was 2 years college.      The patient currently disabled.      Identified important Relationships are "My daughter, brothers, grands and great-grands."       Pets : chihuahua Chief Executive Officer)       Interests / Fun: "I don't"       Current Stressors: "Nothing"       Religious / Personal Beliefs: '[employer]"       Other: "I am a very loving person"      L. [place], BSN, RN-BC        Social Determinants of Health   Financial Resource Strain:   . Difficulty of Paying Living Expenses: Not on file  Food Insecurity:   . Worried About Charity fundraiser in the Last Year: Not on file  . Ran Out of Food in the Last Year: Not on file  Transportation Needs:   . Lack of Transportation (Medical): Not on file  . Lack of Transportation (Non-Medical): Not on file  Physical Activity:   . Days of Exercise per Week: Not on file  . Minutes of Exercise per Session: Not on file  Stress:   . Feeling of Stress : Not on file  Social Connections: Unknown  . Frequency of Communication with Friends and Family: Three times a week  . Frequency of Social Gatherings with Friends and Family: Not on file  . Attends Religious Services: Not on file  . Active Member of Clubs or Organizations: Not on file  . Attends Archivist Meetings: Not on file  . Marital Status: Divorced  Human resources officer Violence:   . Fear of Current or Ex-Partner: Not on file  . Emotionally Abused: Not on file  . Physically Abused: Not on file  . Sexually Abused: Not  on file     REVIEW OF SYSTEMS:   [X] denotes positive finding, [ ] denotes negative finding Cardiac  Comments:  Chest pain or chest pressure:    Shortness of breath upon exertion:    Short of breath when lying flat: x   Irregular heart rhythm:        Vascular    Pain in calf, thigh, or hip brought on by ambulation:    Pain in feet at night that wakes you up from  your sleep:     Blood clot in your veins:    Leg swelling:  x       Pulmonary    Oxygen at home:    Productive cough:     Wheezing:         Neurologic    Sudden weakness in arms or legs:     Sudden numbness in arms or legs:     Sudden onset of difficulty speaking or slurred speech:    Temporary loss of vision in one eye:     Problems with dizziness:         Gastrointestinal    Blood in stool:     Vomited blood:         Genitourinary    Burning when urinating:     Blood in urine:        Psychiatric    Major depression:         Hematologic    Bleeding problems:    Problems with blood clotting too easily:        Skin    Rashes or ulcers:        Constitutional    Fever or chills:      PHYSICAL EXAMINATION:  Today's Vitals   11/13/20 1400  BP: 122/87  Pulse: 96  Resp: 20  Temp: (!) 97.2 F (36.2 C)  TempSrc: Temporal  SpO2: 98%  Weight: 162 lb 4.8 oz (73.6 kg)  Height: 5' 7" (1.702 m)  PainSc: 7    Body mass index is 25.42 kg/m.   General:  WDWN in NAD; vital signs documented above Gait: Not observed HENT: WNL, normocephalic Pulmonary: normal non-labored breathing , without wheezing Cardiac: regular HR, without  Murmur; without carotid bruits Abdomen: soft, NT, no masses; aortic pulse is not palpable Skin: without rashes Vascular Exam/Pulses:  Right Left  Radial 2+ (normal) 2+ (normal)  Popliteal Unable to palpate Unable to palpate  DP 2+ (normal) 2+ (normal)  PT Unable to palpate Unable to palpate   Extremities: without ischemic changes, without Gangrene , without cellulitis;  without open wounds;  Musculoskeletal: no muscle wasting or atrophy  Neurologic: A&O X 3;  No focal weakness or paresthesias are detected Psychiatric:  The pt has Normal affect.   Non-Invasive Vascular Imaging:   ABI's/TBI's on 11/13/2020: Right:  0.98/0.98 - Great toe pressure: 145 Left:  1/1.02 - Great toe pressure: 151  Arterial duplex on 11/13/2020: Right Graft #1: Fem Pop  +------------------+--------+--------+--------+--------+           PSV cm/sStenosisWaveformComments  +------------------+--------+--------+--------+--------+  Inflow      92       biphasic      +------------------+--------+--------+--------+--------+  Prox Anastomosis 117       biphasic      +------------------+--------+--------+--------+--------+  Proximal Graft  73       biphasic      +------------------+--------+--------+--------+--------+  Mid Graft     77       biphasic      +------------------+--------+--------+--------+--------+  Distal Graft   76       biphasic      +------------------+--------+--------+--------+--------+  Distal Anastomosis58       biphasic      +------------------+--------+--------+--------+--------+  Outflow      45       biphasic      +------------------+--------+--------+--------+--------+   Summary:  Left: Patent bypass graft.   Previous ABI's/TBI's on 03/06/2020: Right:  1.11/0.95 - Great toe pressure: 184 Left:  1.12/1.01/- Great toe  pressure:  194  Previous arterial duplex on 03/06/2020:  Left Graft #1:  PSV cm/sStenosisWaveform Comments  +--------------------+--------+--------+---------+--------+  Inflow       112       triphasic      +--------------------+--------+--------+---------+--------+  Proximal Anastomosis122       triphasic        +--------------------+--------+--------+---------+--------+  Proximal Graft   125       triphasic      +--------------------+--------+--------+---------+--------+  Mid Graft      127       triphasic      +--------------------+--------+--------+---------+--------+  Distal Graft    83       triphasic      +--------------------+--------+--------+---------+--------+  Distal Anastomosis 86       biphasic       +--------------------+--------+--------+---------+--------+  Outflow       114       triphasic      +--------------------+--------+--------+---------+--------+   Summary:  Left: Patent graft with no evidence of stenosis.   Previous DVT study 08/15/2020: No evidence of DVT BLE   ASSESSMENT/PLAN:: 64 y.o. female here for follow up for PAD and is s/p left femoral to popliteal bypass grafting in October 2020 by Dr. Oneida Alar with GSV  -pt's arterial duplex today reveals patent bypass without evidence of stenosis.  She has palpable DP pulses bilaterally.  She does not have any rest pain or claudication or non healing wounds. -she does continue to c/o pain at the medial aspect of the knee.  She has been seen by ortho for this and so for, unremarkable.  She does have some numbness around the distal incision, which certainly could be due to nerve irritation and I discussed this with the pt that hopefully this will improve with time.  -she states that she continues to have leg swelling.  I discussed with her that since we had taken her saphenous vein out for the bypass, that leg swelling could be expected and discussed leg elevation.  -she also has a couple of nodules on the anterior thigh that could be lipomas, but will defer to her PCP/ortho.  She was worried these were blood clots and I discussed with her that they were not blood clots.  I also discussed her DVT study from August was negative for  DVT bilaterally.   -discussed again with pt about importance of smoking cessation.   -pt will f/u in one year with arterial duplex and ABI.  She knows to call sooner should she develop any rest pain or non healing wounds.    Leontine Locket, [facility] [phone]  Clinic MD:   Oneida Alar

## 2020-11-14 NOTE — Telephone Encounter (Signed)
Form signed and returned to me. succuss fully faxed on 11/13/20

## 2020-11-17 ENCOUNTER — Telehealth: Payer: Self-pay

## 2020-11-17 NOTE — Telephone Encounter (Signed)
Called pt no answer unable to LVM. Pt disability parking placard has been completed and ready for pick up. Form is left at the front desk inside the cabin.

## 2020-11-24 ENCOUNTER — Encounter: Payer: Self-pay | Admitting: Student

## 2020-11-24 ENCOUNTER — Ambulatory Visit (INDEPENDENT_AMBULATORY_CARE_PROVIDER_SITE_OTHER): Payer: Medicare Other | Admitting: Student

## 2020-11-24 ENCOUNTER — Other Ambulatory Visit: Payer: Self-pay

## 2020-11-24 VITALS — BP 134/78 | HR 76 | Temp 98.6°F | Ht 67.0 in | Wt 162.6 lb

## 2020-11-24 DIAGNOSIS — E1165 Type 2 diabetes mellitus with hyperglycemia: Secondary | ICD-10-CM

## 2020-11-24 DIAGNOSIS — Z794 Long term (current) use of insulin: Secondary | ICD-10-CM | POA: Diagnosis not present

## 2020-11-24 DIAGNOSIS — W19XXXA Unspecified fall, initial encounter: Secondary | ICD-10-CM | POA: Insufficient documentation

## 2020-11-24 DIAGNOSIS — E119 Type 2 diabetes mellitus without complications: Secondary | ICD-10-CM

## 2020-11-24 DIAGNOSIS — I10 Essential (primary) hypertension: Secondary | ICD-10-CM

## 2020-11-24 DIAGNOSIS — Z79899 Other long term (current) drug therapy: Secondary | ICD-10-CM | POA: Diagnosis not present

## 2020-11-24 LAB — POCT GLYCOSYLATED HEMOGLOBIN (HGB A1C): HbA1c POC (<> result, manual entry): >14 % — AB (ref 4.0–5.6)

## 2020-11-24 LAB — GLUCOSE, CAPILLARY: Glucose-Capillary: 294 mg/dL — ABNORMAL HIGH (ref 70–99)

## 2020-11-24 NOTE — Assessment & Plan Note (Signed)
Removing hydroxyzine, lyrica, and tizanidine at this time. Continue optimizing medication list during subsequent visits.

## 2020-11-24 NOTE — Assessment & Plan Note (Addendum)
Patient reports that her recent blood sugar readings have been on the higher side. She reports taking lantus 33 units qhs and novolog 15 units TID with meals regularly. Ordered POC HbA1c today.  -f/u HbA1c -continue lantus 33 units qhs and novolog 15 units TID with meals for now -can readjust medications as needed

## 2020-11-24 NOTE — Patient Instructions (Signed)
Susan Holmes,  It was a pleasure seeing you in the clinic today.   We discussed the following today:  1. Please STOP taking lyrica, tizanidine, and hydroxyzine at home.  2. If you have any more falls or weakness like this, then please call the clinic.  3. Please come back in 1 month. If you have these symptoms again or if they are affecting your lifestyle, please schedule for a sooner dater.  Please call our clinic at [phone] if you have any questions or concerns. The best time to call is Monday-Friday from 9am-4pm, but there is someone available 24/7 at the same number. If you need medication refills, please notify your pharmacy one week in advance and they will send [place] a request.   Thank you for letting us take part in your care. We look forward to seeing you next time!

## 2020-11-24 NOTE — Assessment & Plan Note (Signed)
Patient with history of HTN, on amlodipine 10mg , valsartan 160mg , HCTZ 12.5mg , coreg 12.5mg  BID. BP 134/78 today, which is at goal.  -continue current medications

## 2020-11-24 NOTE — Assessment & Plan Note (Signed)
Patient reporting that she had a fall this AM. States that she went into the bathroom and as she was coming out her legs just gave out on her. Denies LOC, head trauma, prodromal symptoms, dizziness, chest pain, palpitations, SHOB. Denies tripping over anything or the floor being wet. She reports that she was unable to get up for a couple of minutes but then was able to pull herself up. She has left knee pain at baseline due to history of PVD s/p fem-pop bypass in 2020. She was seen by vascular last week, with unremarkable ABI and arterial duplex. She has also been seen by ortho for left knee pain, but workup has been negative (thought to be due to nerve irritation after bypass which should improve with time).   After chart review, it appears that the patient is on numerous medications, including many centrally-acting medications (hydroxyzine, tizanidine, oxycodone, lyrica, gabapentin, cymbalta, mirtazipine, trazodone). At this time, my main differential for patient's fall is from polypharmacy especially with this many centrally acting long-term medications. I discussed with the patient that we will try to optimize her medication list, which she agrees with. At this time, we will remove hydroxyzine, tizanidine, and lyrica (she is already taking gabapentin) from her medication list. Will have patient come back in 1 month (or earlier if she has similar symptoms again).   Plan: -stop lyrica, tizanidine, hydroxyzine -continue to work on optimizing her medication list during subsequent visits

## 2020-11-24 NOTE — Progress Notes (Signed)
CC: fall this AM  HPI:  [name] is a 64 y.o. female with history listed below presenting to the [facility] due to a fall this morning. Please see individualized A&P for full HPI.  Past Medical History:  Diagnosis Date  . Anxiety   . Arthritis   . Callus of foot 09/19/2018  . Chest pain 09/26/2018  . Claudication ([place]) 09/14/2019  . Congenital blindness    Left  eye  . Coronary artery disease    per cardiac cath 2015-- mild disease involving LAD and branches ,  LCFx and branches, and mild disease pRCA  . Cough 07/08/2016  . Depression   . Diabetes mellitus, type II ([place])    followed by pcp  . Glaucoma   . Glaucoma, both eyes   . Hepatitis B infection pt unsure   resolved  . History of kidney stones    right stone  . History of transient ischemic attack (TIA) 2017   per pt no residuals  . History of vitamin D deficiency 09/06/2017  . HTN (hypertension)    followed by pcp  . Hyperlipidemia   . PAD (peripheral artery disease) ([place])   . Paronychia of great toe, left 06/21/2019  . Poor dental hygiene   . Stroke ([place])   . Tobacco abuse    .5 PPD smoker  . Uncontrolled type II diabetes mellitus ([place]) 06/12/2007   Qualifier: Diagnosis of  By: Drinkard MSN, FNP-C, Collie Siad     Current Outpatient Medications on File Prior to Visit  Medication Sig Dispense Refill  . allopurinol (ZYLOPRIM) 100 MG tablet Take 1 tablet (100 mg total) by mouth daily. 90 tablet 1  . amLODipine (NORVASC) 10 MG tablet Take 1 tablet (10 mg total) by mouth daily. 90 tablet 3  . aspirin EC 81 MG tablet Take 1 tablet (81 mg total) by mouth daily. 90 tablet 1  . Blood Glucose Monitoring Suppl (ACCU-CHEK GUIDE ME) w/Device KIT Check blood sugar 3 times a day 1 kit 0  . busPIRone (BUSPAR) 7.5 MG tablet Take 7.5 mg by mouth 2 (two) times daily.     . carvedilol (COREG) 12.5 MG tablet TAKE 1 TABLET(12.5 MG) BY MOUTH TWICE DAILY 60 tablet 6  . Continuous Blood Gluc Receiver (FREESTYLE LIBRE 2 READER) DEVI Use to  check blood sugar at least 6 times a day 1 each 0  . Continuous Blood Gluc Sensor (FREESTYLE LIBRE 2 SENSOR) MISC Use to check blood glucose at least 6 times a day 7 each 3  . diclofenac Sodium (VOLTAREN) 1 % GEL Apply 2 g topically daily as needed (pain).     . dorzolamide-timolol (COSOPT) 22.3-6.8 MG/ML ophthalmic solution Place 1 drop into both eyes every evening. 10 mL 1  . Dulaglutide (TRULICITY) 1.5 NH/6.5BX SOPN ADMINISTER 1.5 MG UNDER THE SKIN EVERY SATURDAY 6 mL 1  . DULoxetine (CYMBALTA) 30 MG capsule TAKE 2 CAPSULE(60 MG) BY MOUTH DAILY (Patient taking differently: Take 60 mg by mouth daily. ) 90 capsule 2  . fluticasone (FLONASE) 50 MCG/ACT nasal spray Place 1 spray into both nostrils daily. (Patient taking differently: Place 1 spray into both nostrils daily as needed for allergies. ) 18.2 mL 0  . gabapentin (NEURONTIN) 300 MG capsule Take 300 mg by mouth 3 (three) times daily.    Marland Kitchen glucose blood (ACCU-CHEK GUIDE) test strip Check blood sugar 3 times per day 100 each 12  . hydrALAZINE (APRESOLINE) 10 MG tablet Take 10 mg by mouth in the  morning and at bedtime.     . hydrochlorothiazide (HYDRODIURIL) 25 MG tablet Take 1 tablet (25 mg total) by mouth daily. 90 tablet 3  . insulin aspart (NOVOLOG) 100 UNIT/ML injection Inject 15 Units into the skin 3 (three) times daily with meals. 13.5 mL 0  . insulin glargine (LANTUS SOLOSTAR) 100 UNIT/ML Solostar Pen Inject 33 Units into the skin at bedtime. 15 mL 1  . Insulin Pen Needle (VALUMARK PEN NEEDLES) 31G X 8 MM MISC 1 each by Does not apply route daily. 100 each 3  . Lancets (ACCU-CHEK SOFT TOUCH) lancets Check blood sugar 3 times a day 100 each 12  . lidocaine (XYLOCAINE) 5 % ointment Apply 1 application topically daily as needed for moderate pain.     Marland Kitchen LINZESS 145 MCG CAPS capsule Take 145 mcg by mouth daily.    Marland Kitchen losartan (COZAAR) 100 MG tablet Take 1 tablet (100 mg total) by mouth daily. 90 tablet 3  . mirtazapine (REMERON) 45 MG tablet  Take 1 tablet (45 mg total) by mouth at bedtime. 30 tablet 1  . NARCAN 4 MG/0.1ML LIQD nasal spray kit Place 1 spray into the nose once.     Marland Kitchen OLANZapine (ZYPREXA) 2.5 MG tablet Take 2.5 mg by mouth at bedtime.    Marland Kitchen oxyCODONE (ROXICODONE) 15 MG immediate release tablet Take 15 mg by mouth every other day as needed for pain.     . polyethylene glycol powder (GLYCOLAX/MIRALAX) 17 GM/SCOOP powder SMARTSIG:1 Scoopful By Mouth 4 Times Daily (Patient taking differently: Take 1 Container by mouth daily. SMARTSIG:1 Scoopful By Mouth 4 Times Daily) 255 g 1  . prednisoLONE acetate (PRED FORTE) 1 % ophthalmic suspension Place 1 drop into the right eye daily. 5 mL 1  . rosuvastatin (CRESTOR) 20 MG tablet Take 1 tablet (20 mg total) by mouth at bedtime. NEED OV. 30 tablet 2  . traZODone (DESYREL) 100 MG tablet Take 300 mg by mouth at bedtime.     No current facility-administered medications on file prior to visit.     Review of Systems:   + left knee pain  Otherwise negative ROS.  Physical Exam:  Vitals:   11/24/20 1524  BP: 134/78  Pulse: 76  Temp: 98.6 F (37 C)  TempSrc: Oral  SpO2: 98%  Weight: 162 lb 9.6 oz (73.8 kg)  Height: '5\' 7"'  (1.702 m)   Physical Exam Constitutional:      Appearance: Normal appearance. She is not ill-appearing.  HENT:     Head: Normocephalic and atraumatic.     Mouth/Throat:     Mouth: Mucous membranes are moist.     Pharynx: Oropharynx is clear. No oropharyngeal exudate.  Eyes:     Extraocular Movements: Extraocular movements intact.     Conjunctiva/sclera: Conjunctivae normal.     Pupils: Pupils are equal, round, and reactive to light.  Cardiovascular:     Rate and Rhythm: Normal rate and regular rhythm.     Pulses: Normal pulses.     Heart sounds: Normal heart sounds. No murmur heard.  No friction rub. No gallop.   Pulmonary:     Effort: Pulmonary effort is normal. No respiratory distress.     Breath sounds: Normal breath sounds. No wheezing,  rhonchi or rales.  Abdominal:     General: Bowel sounds are normal. There is no distension.     Palpations: Abdomen is soft.     Tenderness: There is no abdominal tenderness. There is no guarding or rebound.  Musculoskeletal:        General: Normal range of motion.     Cervical back: Normal range of motion.     Comments: 1+ LLE swelling. Tenderness to palpation at left knee  Skin:    General: Skin is warm and dry.     Findings: No rash.  Neurological:     General: No focal deficit present.     Mental Status: She is alert and oriented to person, place, and time.     Cranial Nerves: No cranial nerve deficit.     Sensory: No sensory deficit.     Motor: No weakness.  Psychiatric:        Mood and Affect: Mood normal.        Behavior: Behavior normal.        Thought Content: Thought content normal.      Assessment & Plan:   See Encounters Tab for problem based charting.  Patient seen with Dr. Evette Doffing

## 2020-11-25 ENCOUNTER — Telehealth: Payer: Self-pay | Admitting: Student

## 2020-11-25 DIAGNOSIS — E1169 Type 2 diabetes mellitus with other specified complication: Secondary | ICD-10-CM

## 2020-11-25 NOTE — Telephone Encounter (Signed)
Spoke with Ms. Bussa via telephone call. Verified identity via DOB and home address. Informed patient about her HbA1c and CBG results. Her HbA1c was 6.7 a few months ago and now it is >14.0. Her CBG is 294.   Prior to calling patient, I called her pharmacy to check when she last picked up her diabetic medications. She picked up her lantus a month and a half ago, but the pharmacy stated that she never picked up novolog from there.   I spoke with the patient about this and she states that she has enough lantus at home and that she has humalog which she picked up from the pharmacy. I asked her again if she has been taking these medications regularly which she reports that she has been. I asked if she is using the same location for her insulin injections to which she replied that she sometimes does but usually picks different locations for her injection site. I asked about any changes in her diet and she reports that she has been doing her best with avoiding foods that would elevate her blood glucose levels.   At this time, I will place a referral to [name] for a thorough evaluation of patient's medications, appropriate medication usage, and for advice as to how to adjust patient's medications to provide optimal glycemic control.

## 2020-11-25 NOTE — Progress Notes (Signed)
Internal Medicine Clinic Attending  I saw and evaluated the patient.  I personally confirmed the key portions of the history and exam documented by Dr. Jinwala and I reviewed pertinent patient test results.  The assessment, diagnosis, and plan were formulated together and I agree with the documentation in the resident's note.  

## 2020-11-27 ENCOUNTER — Other Ambulatory Visit: Payer: Self-pay

## 2020-11-27 ENCOUNTER — Telehealth: Payer: Self-pay

## 2020-11-27 ENCOUNTER — Ambulatory Visit (INDEPENDENT_AMBULATORY_CARE_PROVIDER_SITE_OTHER): Payer: Medicare Other

## 2020-11-27 ENCOUNTER — Ambulatory Visit (INDEPENDENT_AMBULATORY_CARE_PROVIDER_SITE_OTHER): Payer: Medicare Other | Admitting: Orthopedic Surgery

## 2020-11-27 DIAGNOSIS — M79605 Pain in left leg: Secondary | ICD-10-CM

## 2020-11-27 DIAGNOSIS — M25511 Pain in right shoulder: Secondary | ICD-10-CM

## 2020-11-27 DIAGNOSIS — M25562 Pain in left knee: Secondary | ICD-10-CM

## 2020-11-27 DIAGNOSIS — M7531 Calcific tendinitis of right shoulder: Secondary | ICD-10-CM | POA: Diagnosis not present

## 2020-11-27 NOTE — Telephone Encounter (Signed)
I spoke with pt and informed her that it looked like she was originally scheduled on 08/15/20 at [facility] for the MRI LSP, pt seems like she did not know she was scheduled, then I read out the appt notes to her where imaging tried call to schedule, pt still doesn't seem to know or remember. Anyways, a new order will need to be placed due to original one said it was completed and pt wants to go to [facility].

## 2020-11-27 NOTE — Addendum Note (Signed)
Addended byLaurann Montana on: 11/27/2020 02:51 PM   Modules accepted: Orders

## 2020-11-27 NOTE — Telephone Encounter (Signed)
IC pt and left vm to return my call

## 2020-11-27 NOTE — Telephone Encounter (Signed)
Patient was seen today-previously order for MRI put in but patient has not had scheduled yet. Can you please call patient to further advise? Thanks.

## 2020-11-30 ENCOUNTER — Encounter: Payer: Self-pay | Admitting: Orthopedic Surgery

## 2020-11-30 NOTE — Progress Notes (Signed)
Office Visit Note   Patient: Susan Holmes           Date of Birth: Feb 02, 1956           MRN: [id number] Visit Date: 11/27/2020 Requested by: [name], MD [address],  [place] [address] PCP: [name], MD  Subjective: Chief Complaint  Patient presents with  . Left Leg - Pain  . Right Shoulder - Pain    HPI: Susan Holmes is a 64 y.o. female who presents to the office complaining of left leg pain.  She complains of medial femoral pain near her left knee.  This pain wakes her up at night.  She has had a left knee MRI that was negative for any acute findings.  She recently saw vascular and they told her that "everything is okay" from a vascular standpoint.  She does note that she feels numb throughout her leg to her foot and "feels cold".  She has not seen a neurologist for such symptoms.  She did have a MRI of the lumbar spine that was ordered but she states that she has not had this done yet.  Additionally she complains of right shoulder pain.  She localizes it to the anterior and lateral aspect of the right shoulder with radiation to the elbow.  Denies any shoulder blade pain.  Denies any injury.  Pain is slowly worsening over the last year.  She notes lifting makes it worse and she does report subjective weakness.  Denies any medications that she takes for her pain.  She does have occasional neck pain and trapezius pain with numbness and tingling in all 5 fingers.  Denies any history of right shoulder surgery.              ROS: All systems reviewed are negative as they relate to the chief complaint within the history of present illness.  Patient denies fevers or chills.  Assessment & Plan: Visit Diagnoses:  1. Right shoulder pain, unspecified chronicity   2. Calcific tendinitis of right shoulder   3. Left knee pain, unspecified chronicity     Plan: Patient is a 64 year old female who presents for reevaluation of left leg pain.  She has been evaluated  multiple times before and impression is that the pain is referred from her lumbar spine with her description of pain as well as the negative left knee MRI scan.  She is also been evaluated by vascular surgery who does not feel that this is a vascular problem.  Plan to reorder MRI of the lumbar spine for further evaluation of left knee pain.  Regarding her right shoulder pain, she has had increasing right shoulder pain laterally over the last year without injury.  Radiographs show calcification at the rotator cuff insertion on the greater tuberosity.  She has no weakness on exam, but does have pain with resisted supraspinatus testing.  Excellent range of motion.  She states that she would like an injection to help with the pain.  However her diabetes is very poorly controlled so cortisone is not an option.  Administered Toradol injection into the subacromial space and patient tolerated this procedure well.  Plan for patient to follow-up after MRI scan to review results.  Follow-Up Instructions: No follow-ups on file.   Orders:  Orders Placed This Encounter  Procedures  . XR Shoulder Right   No orders of the defined types were placed in this encounter.     Procedures: Large  Joint Inj: R subacromial bursa on 12/01/2020 1:40 PM Indications: diagnostic evaluation and pain Details: 18 G 1.5 in needle, posterior approach  Arthrogram: No  Medications: 9 mL bupivacaine 0.5 %; 5 mL lidocaine 1 % Outcome: tolerated well, no immediate complications Procedure, treatment alternatives, risks and benefits explained, specific risks discussed. Consent was given by the patient. Immediately prior to procedure a time out was called to verify the correct patient, procedure, equipment, support staff and site/side marked as required. Patient was prepped and draped in the usual sterile fashion.    Toradol injected instead of Depo-Medrol   Clinical Data: No additional findings.  Objective: Vital Signs: There  were no vitals taken for this visit.  Physical Exam:  Constitutional: Patient appears well-developed HEENT:  Head: Normocephalic Eyes:EOM are normal Neck: Normal range of motion Cardiovascular: Normal rate Pulmonary/chest: Effort normal Neurologic: Patient is alert Skin: Skin is warm Psychiatric: Patient has normal mood and affect  Ortho Exam: Ortho exam demonstrates right shoulder with 60 degrees external rotation, 95 degrees abduction, 140 degrees forward flexion.  Anterior crepitus noted with arm at 90 degrees of abduction.  Tenderness over the bicipital groove.  No tenderness over the [facility] joint.  Pain with passive range of motion the shoulder.  No significant tenderness over the axial cervical spine.  No pain with cervical spine range of motion.  No rotator cuff weakness on exam.  Pain with resisted supraspinatus testing  Specialty Comments:  No specialty comments available.  Imaging: No results found.   PMFS History: Patient Active Problem List   Diagnosis Date Noted  . Fall 11/24/2020  . Hearing loss 10/23/2020  . Polypharmacy 09/09/2020  . Sinus congestion 10/11/2019  . PVD (peripheral vascular disease) ([place]) 10/04/2019  . Insulin dependent type 2 diabetes mellitus ([place]) 10/04/2019  . Reactive airway disease 06/20/2018  . Unintentional weight loss 06/14/2018  . History of CVA (cerebrovascular accident) 07/23/2016  . Osteoarthritis 12/11/2014  . History of chest pain 10/07/2014  . Pre-operative cardiovascular examination 10/07/2014  . Liver hemangioma 04/04/2014  . RBC microcytosis 02/05/2014  . Hyperparathyroidism ([place]) 03/08/2013  . Calcific tendinitis of right shoulder 02/26/2012  . Hyperlipidemia associated with type 2 diabetes mellitus ([place]) 06/12/2007  . TOBACCO ABUSE 06/12/2007  . Depression 06/12/2007  . CONGENITAL BLINDNESS, LEFT EYE 06/12/2007  . Essential hypertension 06/12/2007   Past Medical History:  Diagnosis Date  . Anxiety   . Arthritis   .  Callus of foot 09/19/2018  . Chest pain 09/26/2018  . Claudication ([place]) 09/14/2019  . Congenital blindness    Left  eye  . Coronary artery disease    per cardiac cath 2015-- mild disease involving LAD and branches ,  LCFx and branches, and mild disease pRCA  . Cough 07/08/2016  . Depression   . Diabetes mellitus, type II ([place])    followed by pcp  . Glaucoma   . Glaucoma, both eyes   . Hepatitis B infection pt unsure   resolved  . History of kidney stones    right stone  . History of transient ischemic attack (TIA) 2017   per pt no residuals  . History of vitamin D deficiency 09/06/2017  . HTN (hypertension)    followed by pcp  . Hyperlipidemia   . PAD (peripheral artery disease) ([place])   . Paronychia of great toe, left 06/21/2019  . Poor dental hygiene   . Stroke ([place])   . Tobacco abuse    .5 PPD smoker  . Uncontrolled type II  diabetes mellitus ([place]) 06/12/2007   Qualifier: Diagnosis of  By: Drinkard MSN, FNP-C, Collie Siad      Family History  Problem Relation Age of Onset  . Heart disease Mother 40  . Hypertension Mother   . CVA Mother   . Heart disease Father 35  . CVA Sister   . Heart disease Sister   . Aneurysm Sister   . CVA Brother   . Aneurysm Brother   . CVA Maternal Grandmother   . CVA Maternal Grandfather   . Hypertension Brother   . Colon cancer Neg Hx   . Esophageal cancer Neg Hx   . Stomach cancer Neg Hx     Past Surgical History:  Procedure Laterality Date  . ABDOMINAL AORTOGRAM W/LOWER EXTREMITY N/A 09/14/2019   Procedure: ABDOMINAL AORTOGRAM W/LOWER EXTREMITY;  Surgeon: Elam Dutch, MD;  Location: [place] [facility];  Service: Cardiovascular;  Laterality: N/A;  . ABDOMINAL HYSTERECTOMY  1980s   unsure if ovaries were taken  . BYPASS GRAFT Left 10/22/2019    Left femoral to above-knee popliteal bypass using reversed ipsilateral greater saphenous vein  . colonoscopy    . FEMORAL-POPLITEAL BYPASS GRAFT Left 10/22/2019   Procedure: LEFT FEMORAL-ABOVE  KNEE POPLITEAL  BYPASS GRAFT with REVERSED GREATER SAPHENOUS VEIN;  Surgeon: Elam Dutch, MD;  Location: [facility];  Service: Vascular;  Laterality: Left;  . GLAUCOMA SURGERY Right 2017  . IR URETERAL STENT RIGHT NEW ACCESS W/O SEP NEPHROSTOMY CATH  10/03/2018  . LEFT HEART CATHETERIZATION WITH CORONARY ANGIOGRAM N/A 10/09/2014   Procedure: LEFT HEART CATHETERIZATION WITH CORONARY ANGIOGRAM;  Surgeon: Troy Sine, MD;  Location: [facility] CATH LAB;  Service: Cardiovascular;  Laterality: N/A;  . NEPHROLITHOTOMY Right 10/03/2018   Procedure: RIGHT NEPHROLITHOTOMY PERCUTANEOUS FIRST STAGE;  Surgeon: Irine Seal, MD;  Location: [facility];  Service: Urology;  Laterality: Right;  . PERIPHERAL VASCULAR INTERVENTION Left 09/14/2019   Procedure: PERIPHERAL VASCULAR INTERVENTION;  Surgeon: Elam Dutch, MD;  Location: [place] [facility];  Service: Cardiovascular;  Laterality: Left;   left external iliac stent   . TEE WITHOUT CARDIOVERSION N/A 08/17/2016   Procedure: TRANSESOPHAGEAL ECHOCARDIOGRAM (TEE);  Surgeon: Sueanne Margarita, MD;  Location: [facility] ENDOSCOPY;  Service: Cardiovascular;  Laterality: N/A;   Social History   Occupational History  . Occupation: Disabled  Tobacco Use  . Smoking status: Current Every Day Smoker    Packs/day: 0.50    Years: 39.00    Pack years: 19.50    Types: Cigarettes    Start date: 37  . Smokeless tobacco: Current User    Types: Chew  . Tobacco comment: 6 cigs per day  Vaping Use  . Vaping Use: Never used  Substance and Sexual Activity  . Alcohol use: No    Alcohol/week: 0.0 standard drinks  . Drug use: No  . Sexual activity: Not Currently    Birth control/protection: Surgical

## 2020-12-01 ENCOUNTER — Encounter: Payer: Self-pay | Admitting: Orthopedic Surgery

## 2020-12-01 DIAGNOSIS — M7531 Calcific tendinitis of right shoulder: Secondary | ICD-10-CM

## 2020-12-01 MED ORDER — BUPIVACAINE HCL 0.5 % IJ SOLN
9.0000 mL | INTRAMUSCULAR | Status: AC | PRN
  Administered 2020-12-01: 9 mL via INTRA_ARTICULAR

## 2020-12-01 MED ORDER — LIDOCAINE HCL 1 % IJ SOLN
5.0000 mL | INTRAMUSCULAR | Status: AC | PRN
  Administered 2020-12-01: 5 mL

## 2020-12-02 ENCOUNTER — Other Ambulatory Visit: Payer: Self-pay | Admitting: Internal Medicine

## 2020-12-02 ENCOUNTER — Other Ambulatory Visit: Payer: Self-pay | Admitting: Surgical

## 2020-12-02 NOTE — Telephone Encounter (Signed)
Pls advise thanks.  

## 2020-12-02 NOTE — Telephone Encounter (Signed)
I was able to reach out to the patient and she does state that she takes hydralazine.

## 2020-12-04 DIAGNOSIS — G8929 Other chronic pain: Secondary | ICD-10-CM | POA: Diagnosis not present

## 2020-12-04 DIAGNOSIS — Z79899 Other long term (current) drug therapy: Secondary | ICD-10-CM | POA: Diagnosis not present

## 2020-12-04 DIAGNOSIS — F17210 Nicotine dependence, cigarettes, uncomplicated: Secondary | ICD-10-CM | POA: Diagnosis not present

## 2020-12-04 DIAGNOSIS — M11262 Other chondrocalcinosis, left knee: Secondary | ICD-10-CM | POA: Diagnosis not present

## 2020-12-04 DIAGNOSIS — M25562 Pain in left knee: Secondary | ICD-10-CM | POA: Diagnosis not present

## 2020-12-09 DIAGNOSIS — H93293 Other abnormal auditory perceptions, bilateral: Secondary | ICD-10-CM | POA: Diagnosis not present

## 2020-12-09 DIAGNOSIS — H903 Sensorineural hearing loss, bilateral: Secondary | ICD-10-CM | POA: Insufficient documentation

## 2020-12-09 DIAGNOSIS — F17210 Nicotine dependence, cigarettes, uncomplicated: Secondary | ICD-10-CM | POA: Diagnosis not present

## 2020-12-09 DIAGNOSIS — H6123 Impacted cerumen, bilateral: Secondary | ICD-10-CM | POA: Insufficient documentation

## 2020-12-09 DIAGNOSIS — J342 Deviated nasal septum: Secondary | ICD-10-CM | POA: Diagnosis not present

## 2020-12-16 ENCOUNTER — Telehealth: Payer: Self-pay | Admitting: Orthopedic Surgery

## 2020-12-31 ENCOUNTER — Ambulatory Visit (INDEPENDENT_AMBULATORY_CARE_PROVIDER_SITE_OTHER): Payer: Medicare Other | Admitting: Podiatry

## 2020-12-31 ENCOUNTER — Other Ambulatory Visit: Payer: Self-pay

## 2020-12-31 ENCOUNTER — Ambulatory Visit
Admission: RE | Admit: 2020-12-31 | Discharge: 2020-12-31 | Disposition: A | Discharge status: Home / Self Care | Payer: Medicare Other | Source: Ambulatory Visit | Attending: Orthopedic Surgery | Admitting: Orthopedic Surgery

## 2020-12-31 DIAGNOSIS — Z794 Long term (current) use of insulin: Secondary | ICD-10-CM | POA: Insufficient documentation

## 2020-12-31 DIAGNOSIS — B351 Tinea unguium: Secondary | ICD-10-CM | POA: Diagnosis not present

## 2020-12-31 DIAGNOSIS — M79675 Pain in left toe(s): Secondary | ICD-10-CM | POA: Diagnosis not present

## 2020-12-31 DIAGNOSIS — L84 Corns and callosities: Secondary | ICD-10-CM

## 2020-12-31 DIAGNOSIS — K3189 Other diseases of stomach and duodenum: Secondary | ICD-10-CM

## 2020-12-31 DIAGNOSIS — M79674 Pain in right toe(s): Secondary | ICD-10-CM | POA: Diagnosis not present

## 2020-12-31 DIAGNOSIS — M545 Low back pain, unspecified: Secondary | ICD-10-CM | POA: Diagnosis not present

## 2020-12-31 DIAGNOSIS — R9389 Abnormal findings on diagnostic imaging of other specified body structures: Secondary | ICD-10-CM | POA: Insufficient documentation

## 2020-12-31 DIAGNOSIS — M2042 Other hammer toe(s) (acquired), left foot: Secondary | ICD-10-CM

## 2020-12-31 DIAGNOSIS — R2991 Unspecified symptoms and signs involving the musculoskeletal system: Secondary | ICD-10-CM | POA: Insufficient documentation

## 2020-12-31 DIAGNOSIS — K30 Functional dyspepsia: Secondary | ICD-10-CM | POA: Insufficient documentation

## 2020-12-31 DIAGNOSIS — E1151 Type 2 diabetes mellitus with diabetic peripheral angiopathy without gangrene: Secondary | ICD-10-CM

## 2020-12-31 DIAGNOSIS — M79605 Pain in left leg: Secondary | ICD-10-CM

## 2020-12-31 DIAGNOSIS — R110 Nausea: Secondary | ICD-10-CM | POA: Insufficient documentation

## 2020-12-31 DIAGNOSIS — M2041 Other hammer toe(s) (acquired), right foot: Secondary | ICD-10-CM

## 2020-12-31 DIAGNOSIS — R198 Other specified symptoms and signs involving the digestive system and abdomen: Secondary | ICD-10-CM | POA: Insufficient documentation

## 2020-12-31 HISTORY — DX: Other diseases of stomach and duodenum: K31.89

## 2021-01-01 ENCOUNTER — Other Ambulatory Visit: Payer: Self-pay | Admitting: Internal Medicine

## 2021-01-01 ENCOUNTER — Encounter: Payer: Self-pay | Admitting: Podiatry

## 2021-01-01 NOTE — Progress Notes (Addendum)
Subjective: Susan Holmes presents today at risk foot care. Pt has h/o NIDDM with PAD and painful callus(es) b/l and painful mycotic toenails b/l that are difficult to trim. Pain interferes with ambulation. Aggravating factors include wearing enclosed shoe gear. Pain is relieved with periodic professional debridement.   She did not check her blood sugar this morning. States it was 285 mg/dl yesterday.  [name], MD is patient's PCP. Last visit was: 10/23/2020.  She voices no new pedal concerns on today's visit.  Past Medical History:  Diagnosis Date  . Anxiety   . Arthritis   . Callus of foot 09/19/2018  . Chest pain 09/26/2018  . Claudication ([place]) 09/14/2019  . Congenital blindness    Left  eye  . Coronary artery disease    per cardiac cath 2015-- mild disease involving LAD and branches ,  LCFx and branches, and mild disease pRCA  . Cough 07/08/2016  . Depression   . Diabetes mellitus, type II ([place])    followed by pcp  . Glaucoma   . Glaucoma, both eyes   . Hepatitis B infection pt unsure   resolved  . History of kidney stones    right stone  . History of transient ischemic attack (TIA) 2017   per pt no residuals  . History of vitamin D deficiency 09/06/2017  . HTN (hypertension)    followed by pcp  . Hyperlipidemia   . PAD (peripheral artery disease) ([place])   . Paronychia of great toe, left 06/21/2019  . Poor dental hygiene   . Stroke ([place])   . Tobacco abuse    .5 PPD smoker  . Uncontrolled type II diabetes mellitus ([place]) 06/12/2007   Qualifier: Diagnosis of  By: Drinkard MSN, FNP-C, Sue       Patient Active Problem List   Diagnosis Date Noted  . Delayed gastric emptying 12/31/2020  . Erosive gastropathy 12/31/2020  . Irregular bowel habits 12/31/2020  . Long term (current) use of insulin ([place]) 12/31/2020  . Musculoskeletal symptom 12/31/2020  . Nausea 12/31/2020  . Standard chest x-ray abnormal 12/31/2020  . Bilateral impacted cerumen 12/09/2020  .  Sensorineural hearing loss (SNHL), bilateral 12/09/2020  . Fall 11/24/2020  . Hearing loss 10/23/2020  . Polypharmacy 09/09/2020  . Sinus congestion 10/11/2019  . PVD (peripheral vascular disease) ([place]) 10/04/2019  . Insulin dependent type 2 diabetes mellitus ([place]) 10/04/2019  . Reactive airway disease 06/20/2018  . Unintentional weight loss 06/14/2018  . History of CVA (cerebrovascular accident) 07/23/2016  . Osteoarthritis 12/11/2014  . History of chest pain 10/07/2014  . Pre-operative cardiovascular examination 10/07/2014  . Liver hemangioma 04/04/2014  . RBC microcytosis 02/05/2014  . Hyperparathyroidism ([place]) 03/08/2013  . Calcific tendinitis of right shoulder 02/26/2012  . Hyperlipidemia associated with type 2 diabetes mellitus ([place]) 06/12/2007  . TOBACCO ABUSE 06/12/2007  . Depression 06/12/2007  . CONGENITAL BLINDNESS, LEFT EYE 06/12/2007  . Essential hypertension 06/12/2007    Current Outpatient Medications on File Prior to Visit  Medication Sig Dispense Refill  . hydrALAZINE (APRESOLINE) 10 MG tablet TAKE 1 TABLET(10 MG) BY MOUTH IN THE MORNING AND AT BEDTIME 180 tablet 1  . allopurinol (ZYLOPRIM) 100 MG tablet Take 1 tablet (100 mg total) by mouth daily. 90 tablet 1  . amLODipine (NORVASC) 10 MG tablet Take 1 tablet (10 mg total) by mouth daily. 90 tablet 3  . aspirin EC 81 MG tablet Take 1 tablet (81 mg total) by mouth daily. 90 tablet 1  . Blood Glucose  Monitoring Suppl (ACCU-CHEK GUIDE ME) w/Device KIT Check blood sugar 3 times a day 1 kit 0  . busPIRone (BUSPAR) 7.5 MG tablet Take 7.5 mg by mouth 2 (two) times daily.     . carvedilol (COREG) 12.5 MG tablet TAKE 1 TABLET(12.5 MG) BY MOUTH TWICE DAILY 60 tablet 6  . Continuous Blood Gluc Receiver (FREESTYLE LIBRE 2 READER) DEVI Use to check blood sugar at least 6 times a day 1 each 0  . Continuous Blood Gluc Sensor (FREESTYLE LIBRE 2 SENSOR) MISC Use to check blood glucose at least 6 times a day 7 each 3  . diclofenac  Sodium (VOLTAREN) 1 % GEL Apply 2 g topically daily as needed (pain).     . dorzolamide-timolol (COSOPT) 22.3-6.8 MG/ML ophthalmic solution Place 1 drop into both eyes every evening. 10 mL 1  . Dulaglutide (TRULICITY) 1.5 UK/0.2RK SOPN ADMINISTER 1.5 MG UNDER THE SKIN EVERY SATURDAY 6 mL 1  . DULoxetine (CYMBALTA) 30 MG capsule TAKE 2 CAPSULE(60 MG) BY MOUTH DAILY (Patient taking differently: Take 60 mg by mouth daily. ) 90 capsule 2  . fluticasone (FLONASE) 50 MCG/ACT nasal spray Place 1 spray into both nostrils daily. (Patient taking differently: Place 1 spray into both nostrils daily as needed for allergies. ) 18.2 mL 0  . gabapentin (NEURONTIN) 300 MG capsule Take 300 mg by mouth 3 (three) times daily.    Marland Kitchen glucose blood (ACCU-CHEK GUIDE) test strip Check blood sugar 3 times per day 100 each 12  . hydrochlorothiazide (HYDRODIURIL) 25 MG tablet Take 1 tablet (25 mg total) by mouth daily. 90 tablet 3  . insulin aspart (NOVOLOG) 100 UNIT/ML injection Inject 15 Units into the skin 3 (three) times daily with meals. 13.5 mL 0  . insulin glargine (LANTUS SOLOSTAR) 100 UNIT/ML Solostar Pen Inject 33 Units into the skin at bedtime. 15 mL 1  . Insulin Pen Needle (VALUMARK PEN NEEDLES) 31G X 8 MM MISC 1 each by Does not apply route daily. 100 each 3  . Lancets (ACCU-CHEK SOFT TOUCH) lancets Check blood sugar 3 times a day 100 each 12  . lidocaine (XYLOCAINE) 5 % ointment Apply 1 application topically daily as needed for moderate pain.     Marland Kitchen LINZESS 145 MCG CAPS capsule Take 145 mcg by mouth daily.    Marland Kitchen losartan (COZAAR) 100 MG tablet Take 1 tablet (100 mg total) by mouth daily. 90 tablet 3  . mirtazapine (REMERON) 45 MG tablet Take 1 tablet (45 mg total) by mouth at bedtime. 30 tablet 1  . NARCAN 4 MG/0.1ML LIQD nasal spray kit Place 1 spray into the nose once.     Marland Kitchen OLANZapine (ZYPREXA) 2.5 MG tablet Take 2.5 mg by mouth at bedtime.    Marland Kitchen oxyCODONE (ROXICODONE) 15 MG immediate release tablet Take 15 mg by  mouth every other day as needed for pain.     . polyethylene glycol powder (GLYCOLAX/MIRALAX) 17 GM/SCOOP powder SMARTSIG:1 Scoopful By Mouth 4 Times Daily (Patient taking differently: Take 1 Container by mouth daily. SMARTSIG:1 Scoopful By Mouth 4 Times Daily) 255 g 1  . prednisoLONE acetate (PRED FORTE) 1 % ophthalmic suspension Place 1 drop into the right eye daily. 5 mL 1  . rosuvastatin (CRESTOR) 20 MG tablet Take 1 tablet (20 mg total) by mouth at bedtime. NEED OV. 30 tablet 2  . traZODone (DESYREL) 100 MG tablet Take 300 mg by mouth at bedtime.     No current facility-administered medications on file prior to visit.  Allergies  Allergen Reactions  . Ace Inhibitors Cough  . Lipitor [Atorvastatin] Other (See Comments)    MYALGIA   . Chantix [Varenicline] Nausea And Vomiting    Objective: Susan Holmes is a pleasant 65 y.o. African American female WD, WN in NAD. AAO x 3.  There were no vitals filed for this visit.  Vascular Examination: Capillary refill time to digits immediate b/l. Palpable DP pulse(s) b/l lower extremities Nonpalpable PT pulse(s) b/l lower extremities. Pedal hair absent. Lower extremity skin temperature gradient within normal limits. No pain with calf compression b/l. No ischemia or gangrene noted b/l lower extremities.  Dermatological Examination: Pedal skin is thin shiny, atrophic b/l lower extremities. No open wounds bilaterally. No interdigital macerations bilaterally. Toenails 2-5 b/l elongated, discolored, dystrophic, thickened, crumbly with subungual debris and tenderness to dorsal palpation. Anonychia b/l great  toe(s) with evidence of permanent total nail avulsion. Nailbed(s) completely epithelialized and intact. Hyperkeratotic lesion(s) submet head 2 right foot, submet head 4 left foot, submet head 5 right foot and plantar aspect of b/l heels.  No erythema, no edema, no drainage, no flocculence.  Musculoskeletal: Normal muscle strength 5/5 to all  lower extremity muscle groups bilaterally. No pain crepitus or joint limitation noted with ROM b/l. Hammertoes noted to the 2-5 bilaterally.  Neurological Examination: Protective sensation intact 5/5 intact bilaterally with 10g monofilament b/l. Vibratory sensation intact b/l. Proprioception intact bilaterally.  Last A1c: Hemoglobin A1C Latest Ref Rng & Units 11/24/2020 08/18/2020 02/06/2020  HGBA1C 4.0 - 5.6 % >14.0(A) 6.7(A) 6.5(A)  Some recent data might be hidden   Assessment: 1. Pain due to onychomycosis of toenails of both feet   2. Callus   3. Acquired hammertoes of both feet   4. Diabetes mellitus type 2 with peripheral artery disease ([place])    Plan: -Examined patient. -No new findings. No new orders. -Continue diabetic foot care principles. -Toenails 2-5 b/l were debrided in length and girth with sterile nail nippers and dremel without iatrogenic bleeding.  -Callus(es) submet head 2 right foot, submet head 4 left foot, submet head 5 right foot and plantar aspect of b/l heels  pared utilizing sterile scalpel blade without complication or incident. Total number debrided =4. -Patient to report any pedal injuries to medical professional immediately. -Patient to continue soft, supportive shoe gear daily. -Patient/POA to call should there be question/concern in the interim.  Return in about 3 months (around 03/31/2021).  [name], DPM

## 2021-01-02 ENCOUNTER — Encounter: Payer: Self-pay | Admitting: Internal Medicine

## 2021-01-02 ENCOUNTER — Encounter: Payer: Self-pay | Admitting: Dietician

## 2021-01-02 ENCOUNTER — Ambulatory Visit (INDEPENDENT_AMBULATORY_CARE_PROVIDER_SITE_OTHER): Payer: Medicare Other | Admitting: Dietician

## 2021-01-02 ENCOUNTER — Ambulatory Visit (INDEPENDENT_AMBULATORY_CARE_PROVIDER_SITE_OTHER): Payer: Medicare Other | Admitting: Internal Medicine

## 2021-01-02 ENCOUNTER — Telehealth: Payer: Self-pay | Admitting: Dietician

## 2021-01-02 VITALS — BP 132/67 | HR 58 | Temp 98.7°F | Wt 165.4 lb

## 2021-01-02 DIAGNOSIS — Z794 Long term (current) use of insulin: Secondary | ICD-10-CM | POA: Diagnosis not present

## 2021-01-02 DIAGNOSIS — G8929 Other chronic pain: Secondary | ICD-10-CM | POA: Diagnosis not present

## 2021-01-02 DIAGNOSIS — M11262 Other chondrocalcinosis, left knee: Secondary | ICD-10-CM | POA: Diagnosis not present

## 2021-01-02 DIAGNOSIS — M25562 Pain in left knee: Secondary | ICD-10-CM | POA: Diagnosis not present

## 2021-01-02 DIAGNOSIS — F17200 Nicotine dependence, unspecified, uncomplicated: Secondary | ICD-10-CM | POA: Diagnosis not present

## 2021-01-02 DIAGNOSIS — E119 Type 2 diabetes mellitus without complications: Secondary | ICD-10-CM

## 2021-01-02 DIAGNOSIS — Z6825 Body mass index (BMI) 25.0-25.9, adult: Secondary | ICD-10-CM

## 2021-01-02 DIAGNOSIS — Z713 Dietary counseling and surveillance: Secondary | ICD-10-CM

## 2021-01-02 DIAGNOSIS — R739 Hyperglycemia, unspecified: Secondary | ICD-10-CM

## 2021-01-02 DIAGNOSIS — E1165 Type 2 diabetes mellitus with hyperglycemia: Secondary | ICD-10-CM

## 2021-01-02 DIAGNOSIS — E559 Vitamin D deficiency, unspecified: Secondary | ICD-10-CM | POA: Diagnosis not present

## 2021-01-02 DIAGNOSIS — R3589 Other polyuria: Secondary | ICD-10-CM

## 2021-01-02 DIAGNOSIS — F17210 Nicotine dependence, cigarettes, uncomplicated: Secondary | ICD-10-CM | POA: Diagnosis not present

## 2021-01-02 DIAGNOSIS — Z86718 Personal history of other venous thrombosis and embolism: Secondary | ICD-10-CM | POA: Diagnosis not present

## 2021-01-02 DIAGNOSIS — Z79899 Other long term (current) drug therapy: Secondary | ICD-10-CM | POA: Diagnosis not present

## 2021-01-02 LAB — URINALYSIS, ROUTINE W REFLEX MICROSCOPIC
Bacteria, UA: NONE SEEN
Bilirubin Urine: NEGATIVE
Glucose, UA: >=500 mg/dL — AB
Hgb urine dipstick: NEGATIVE
Ketones, ur: NEGATIVE mg/dL
Nitrite: NEGATIVE
Protein, ur: NEGATIVE mg/dL
Specific Gravity, Urine: 1.026 (ref 1.005–1.030)
pH: 6 (ref 5.0–8.0)

## 2021-01-02 LAB — BASIC METABOLIC PANEL
Anion gap: 11 (ref 5–15)
BUN: 9 mg/dL (ref 8–23)
CO2: 29 mmol/L (ref 22–32)
Calcium: 10.3 mg/dL (ref 8.9–10.3)
Chloride: 93 mmol/L — ABNORMAL LOW (ref 98–111)
Creatinine, Ser: 1.16 mg/dL — ABNORMAL HIGH (ref 0.44–1.00)
GFR, Estimated: 52 mL/min — ABNORMAL LOW (ref >60)
Glucose, Bld: 533 mg/dL (ref 70–99)
Potassium: 4.7 mmol/L (ref 3.5–5.1)
Sodium: 133 mmol/L — ABNORMAL LOW (ref 135–145)

## 2021-01-02 LAB — GLUCOSE, CAPILLARY
Glucose-Capillary: 466 mg/dL — ABNORMAL HIGH (ref 70–99)
Glucose-Capillary: 428 mg/dL — ABNORMAL HIGH (ref 70–99)
Glucose-Capillary: 473 mg/dL — ABNORMAL HIGH (ref 70–99)

## 2021-01-02 MED ORDER — INSULIN ASPART 100 UNIT/ML ~~LOC~~ SOLN
8.0000 [IU] | Freq: Once | SUBCUTANEOUS | Status: AC
  Administered 2021-01-02: 8 [IU] via SUBCUTANEOUS

## 2021-01-02 NOTE — Patient Instructions (Signed)
Thank you for allowing [place] to provide your care today. Today we discussed type II diabetes   I have ordered the following labs for you:  Basic metabolic panel    I will call if any are abnormal.    Please check your glucose four times per day - before each meal and before bed. Please write down all of your glucose readings.   Continue to take your lantus 03O and trulicity.   If your glucose is less than 100, do not take your novolog   Please follow-up next week. Please bring your glucose readings.    Please call the internal medicine center clinic if you have any questions or concerns, we may be able to help and keep you from a long and expensive emergency room wait. Our clinic and after hours phone number is [phone], the best time to call is Monday through Friday 9 am to 4 pm but there is always someone available 24/7 if you have an emergency. If you need medication refills please notify your pharmacy one week in advance and they will send [place] a request.

## 2021-01-02 NOTE — Patient Instructions (Addendum)
Dear Susan Holmes,  [facility] has been trying to call you. They need you to call them at   [phone] between 12 PM and 8 PM   before they can mail out your Continuous glucose monitoring supplies.   I made an appointment in 2 weeks. Bring the Continuous glucose monitoring supplies to our visit.

## 2021-01-02 NOTE — Progress Notes (Signed)
   CC: hyperglycemia  HPI:  Susan Holmes is a 65 y.o. with PMH as below.   Please see A&P for assessment of the patient's acute and chronic medical conditions.   Patient presenting from appointment with diabetes coordinator with glucose of 473. She endorses increased urination and thirst. She has been drinking a lot of water. She denies dizziness, nausea, vomiting, abdominal pain, or dysuria. She has had occasional muscle twitching. No fever, chills, SOB, chest pain, no recent cuts.  She endorses taking her lantus 33U qhs, novolog 15U tid. When asked if she takes any other medication for diabetes she states "no," but when asked about trulicity she does endorse taking this once per week. When trying to discuss her diet she states, "zero sugar," but did not elaborate much.    Past Medical History:  Diagnosis Date  . Anxiety   . Arthritis   . Callus of foot 09/19/2018  . Chest pain 09/26/2018  . Claudication ([place]) 09/14/2019  . Congenital blindness    Left  eye  . Coronary artery disease    per cardiac cath 2015-- mild disease involving LAD and branches ,  LCFx and branches, and mild disease pRCA  . Cough 07/08/2016  . Depression   . Diabetes mellitus, type II ([place])    followed by pcp  . Glaucoma   . Glaucoma, both eyes   . Hepatitis B infection pt unsure   resolved  . History of kidney stones    right stone  . History of transient ischemic attack (TIA) 2017   per pt no residuals  . History of vitamin D deficiency 09/06/2017  . HTN (hypertension)    followed by pcp  . Hyperlipidemia   . PAD (peripheral artery disease) ([place])   . Paronychia of great toe, left 06/21/2019  . Poor dental hygiene   . Stroke ([place])   . Tobacco abuse    .5 PPD smoker  . Uncontrolled type II diabetes mellitus ([place]) 06/12/2007   Qualifier: Diagnosis of  By: Drinkard MSN, FNP-C, Sue     Review of Systems:   10 point ROS negative except as noted in HPI  Physical Exam:   Constitution: NAD,  appears stated age HENT: [place]/AT Eyes: no icterus or injection  Cardio: RRR, no m/r/g, no LE edema  Respiratory: CTA, no w/r/r Abdominal: NTTP, soft, non-distended MSK: moving all extremities Neuro: normal affect, a&ox3 Skin: no cuts or lesions on feet, skin is c/d/i     Vitals:   01/02/21 1103  BP: 132/67  Pulse: (!) 58  Temp: 98.7 F (37.1 C)  TempSrc: Oral  SpO2: 99%  Weight: 165 lb 6.4 oz (75 kg)     Assessment & Plan:   See Encounters Tab for problem based charting.  Patient discussed with Dr. Jimmye Norman

## 2021-01-02 NOTE — Progress Notes (Signed)
Diabetes Self-Management Education  Visit Type:  Follow-up  Appt. Start Time: 1030 Appt. End Time: 1100  01/02/2021  [name], identified by name and date of birth, is a 65 y.o. female with a diagnosis of Diabetes:  Type   ASSESSMENT  Patient presented today for personal CGM training. She did not have her supplies but had a letter from [facility] that it has been approved. Call to [employer]: they states that they left voicemail for Ms Susan Holmes a few times to confirm the order. She needs to call [employer] @ [phone].  [name] was also complaining of blood sugars in the 500s and excessive urination and wondered is she had a urinary tract infection. She was also asking about her medications wondering if one of them or an interaction between two fo them was making her fall and twitch. The attending doctor and pharmacist were consulted and her care was transferred to a doctor visit.   She states she is taking her diabetes medications as directed and her last dose of insulin as 33 units of lantus last night.  Her blood sugar today was  CBG (last 3)  Recent Labs    01/02/21 1053 01/02/21 1139 01/02/21 1205  GLUCAP 466* 473* 428*     Estimated body mass index is 25.91 kg/m as calculated from the following:   Height as of 11/24/20: 5\' 7"  (1.702 m).   Weight as of an earlier encounter on 01/02/21: 165 lb 6.4 oz (75 kg). Wt Readings from Last 10 Encounters:  01/02/21 165 lb 6.4 oz (75 kg)  11/24/20 162 lb 9.6 oz (73.8 kg)  11/13/20 162 lb 4.8 oz (73.6 kg)  11/07/20 171 lb 1.6 oz (77.6 kg)  10/23/20 167 lb 14.4 oz (76.2 kg)  10/16/20 161 lb (73 kg)  10/14/20 162 lb (73.5 kg)  10/14/20 162 lb 3.2 oz (73.6 kg)  10/11/20 169 lb (76.7 kg)  09/29/20 169 lb (76.7 kg)     Lab Results  Component Value Date   HGBA1C >14.0 (A) 11/24/2020   HGBA1C 6.7 (A) 08/18/2020   HGBA1C 6.5 (A) 02/06/2020   HGBA1C 10.2 (A) 10/10/2019   HGBA1C 10.6 (A) 02/01/2019      Diabetes Self-Management Education  - 01/02/21 1400      Psychosocial Assessment   Patient Belief/Attitude about Diabetes Motivated to manage diabetes      Pre-Education Assessment   Patient undertands incorporating physical activity into lifestyle. Demonstrates understanding / competency    Patient understands using medications safely. Needs Review      Complications   Last HgB A1C per patient/outside source 14 %    How often do you check your blood sugar? 1-2 times/day    Fasting Blood glucose range (mg/dL) >200    Postprandial Blood glucose range (mg/dL) >200    Number of hypoglycemic episodes per month 0    Number of hyperglycemic episodes per week 100    Can you tell when your blood sugar is high? No      Patient Education   Previous Diabetes Education Yes (please comment)   here and [facility]   Nutrition management  Meal timing in regards to the patients' current diabetes medication.    Acute complications Discussed and identified patients' treatment of hyperglycemia.    Chronic complications Nephropathy, what it is, prevention of, the use of ACE, ARB's and early detection of through urine microalbumia.      Outcomes   Program Status Re-entered      Subsequent Visit  Since your last visit have you continued or begun to take your medications as prescribed? Yes    Since your last visit have you had your blood pressure checked? Yes    Is your most recent blood pressure lower, unchanged, or higher since your last visit? Unchanged    Since your last visit have you experienced any weight changes? Loss    Weight Loss (lbs) --   pt reports wt loss, but wt has been stable & reports a good appetite   Since your last visit, are you checking your blood glucose at least once a day? Yes   did not bring meter today          Learning Objective:  Patient will have a greater understanding of diabetes self-management. Patient education plan is to attend individual and/or group sessions per assessed needs and concerns.   Plan:    There are no Patient Instructions on file for this visit.   Expected Outcomes:  Demonstrated interest in learning. Expect positive outcomes  Education material provided: Diabetes Resources  If problems or questions, patient to contact team via:  Phone  Future DSME appointment: - 2 wks  Susan Holmes, RD 01/02/2021 3:18 PM.

## 2021-01-02 NOTE — Telephone Encounter (Signed)
Left patient a message asking her to call [employer] at [phone] between 12 Pm and 8 PM  to obtain her Continuous glucose monitoring supplies.

## 2021-01-03 LAB — MICROALBUMIN / CREATININE URINE RATIO
Creatinine, Urine: 40 mg/dL
Microalb/Creat Ratio: <8 mg/g creat (ref 0–29)
Microalbumin, Urine: <3 ug/mL

## 2021-01-04 LAB — URINE CULTURE: Culture: 10000 — AB

## 2021-01-05 ENCOUNTER — Ambulatory Visit (INDEPENDENT_AMBULATORY_CARE_PROVIDER_SITE_OTHER): Payer: Medicare Other | Admitting: Internal Medicine

## 2021-01-05 ENCOUNTER — Other Ambulatory Visit: Payer: Self-pay

## 2021-01-05 VITALS — BP 113/78 | HR 74 | Temp 98.4°F | Wt 162.8 lb

## 2021-01-05 DIAGNOSIS — Z794 Long term (current) use of insulin: Secondary | ICD-10-CM

## 2021-01-05 DIAGNOSIS — E119 Type 2 diabetes mellitus without complications: Secondary | ICD-10-CM | POA: Diagnosis not present

## 2021-01-05 LAB — GLUCOSE, CAPILLARY: Glucose-Capillary: 479 mg/dL — ABNORMAL HIGH (ref 70–99)

## 2021-01-05 MED ORDER — LANTUS SOLOSTAR 100 UNIT/ML ~~LOC~~ SOPN: 38.0000 [IU] | PEN_INJECTOR | Freq: Every day | SUBCUTANEOUS | 1 refills | Status: DC

## 2021-01-05 MED ORDER — INSULIN ASPART 100 UNIT/ML ~~LOC~~ SOLN
5.0000 [IU] | Freq: Once | SUBCUTANEOUS | Status: AC
  Administered 2021-01-05: 5 [IU] via SUBCUTANEOUS

## 2021-01-05 NOTE — Progress Notes (Signed)
CC: DM  HPI:  [name] is a 65 y.o. female with a past medical history stated below and presents today for DM. Please see problem based assessment and plan for additional details.  Past Medical History:  Diagnosis Date  . Anxiety   . Arthritis   . Callus of foot 09/19/2018  . Chest pain 09/26/2018  . Claudication ([place]) 09/14/2019  . Congenital blindness    Left  eye  . Coronary artery disease    per cardiac cath 2015-- mild disease involving LAD and branches ,  LCFx and branches, and mild disease pRCA  . Cough 07/08/2016  . Depression   . Diabetes mellitus, type II ([place])    followed by pcp  . Glaucoma   . Glaucoma, both eyes   . Hepatitis B infection pt unsure   resolved  . History of kidney stones    right stone  . History of transient ischemic attack (TIA) 2017   per pt no residuals  . History of vitamin D deficiency 09/06/2017  . HTN (hypertension)    followed by pcp  . Hyperlipidemia   . PAD (peripheral artery disease) ([place])   . Paronychia of great toe, left 06/21/2019  . Poor dental hygiene   . Stroke ([place])   . Tobacco abuse    .5 PPD smoker  . Uncontrolled type II diabetes mellitus ([place]) 06/12/2007   Qualifier: Diagnosis of  By: [place] MSN, FNP-C, Collie Siad      Current Outpatient Medications on File Prior to Visit  Medication Sig Dispense Refill  . allopurinol (ZYLOPRIM) 100 MG tablet Take 1 tablet (100 mg total) by mouth daily. 90 tablet 1  . amLODipine (NORVASC) 10 MG tablet Take 1 tablet (10 mg total) by mouth daily. 90 tablet 3  . aspirin EC 81 MG tablet Take 1 tablet (81 mg total) by mouth daily. 90 tablet 1  . Blood Glucose Monitoring Suppl (ACCU-CHEK GUIDE ME) w/Device KIT Check blood sugar 3 times a day 1 kit 0  . busPIRone (BUSPAR) 7.5 MG tablet Take 7.5 mg by mouth 2 (two) times daily.     . carvedilol (COREG) 12.5 MG tablet TAKE 1 TABLET(12.5 MG) BY MOUTH TWICE DAILY 60 tablet 6  . Continuous Blood Gluc Receiver (FREESTYLE LIBRE 2 READER) DEVI  Use to check blood sugar at least 6 times a day 1 each 0  . Continuous Blood Gluc Sensor (FREESTYLE LIBRE 2 SENSOR) MISC Use to check blood glucose at least 6 times a day 7 each 3  . diclofenac Sodium (VOLTAREN) 1 % GEL Apply 2 g topically daily as needed (pain).     . dorzolamide-timolol (COSOPT) 22.3-6.8 MG/ML ophthalmic solution Place 1 drop into both eyes every evening. 10 mL 1  . Dulaglutide (TRULICITY) 1.5 MP/5.3IR SOPN ADMINISTER 1.5 MG UNDER THE SKIN EVERY SATURDAY 6 mL 1  . DULoxetine (CYMBALTA) 30 MG capsule TAKE 2 CAPSULE(60 MG) BY MOUTH DAILY (Patient taking differently: Take 60 mg by mouth daily.) 90 capsule 2  . fluticasone (FLONASE) 50 MCG/ACT nasal spray Place 1 spray into both nostrils daily. (Patient taking differently: Place 1 spray into both nostrils daily as needed for allergies. ) 18.2 mL 0  . gabapentin (NEURONTIN) 300 MG capsule Take 300 mg by mouth 3 (three) times daily.    Marland Kitchen glucose blood (ACCU-CHEK GUIDE) test strip Check blood sugar 3 times per day 100 each 12  . hydrALAZINE (APRESOLINE) 10 MG tablet TAKE 1 TABLET(10 MG) BY MOUTH IN THE  MORNING AND AT BEDTIME 180 tablet 1  . hydrochlorothiazide (HYDRODIURIL) 25 MG tablet Take 1 tablet (25 mg total) by mouth daily. 90 tablet 3  . insulin aspart (NOVOLOG) 100 UNIT/ML injection Inject 15 Units into the skin 3 (three) times daily with meals. 13.5 mL 0  . Insulin Pen Needle (VALUMARK PEN NEEDLES) 31G X 8 MM MISC 1 each by Does not apply route daily. 100 each 3  . Lancets (ACCU-CHEK SOFT TOUCH) lancets Check blood sugar 3 times a day 100 each 12  . lidocaine (XYLOCAINE) 5 % ointment Apply 1 application topically daily as needed for moderate pain.     Marland Kitchen LINZESS 145 MCG CAPS capsule Take 145 mcg by mouth daily.    Marland Kitchen losartan (COZAAR) 100 MG tablet Take 1 tablet (100 mg total) by mouth daily. 90 tablet 3  . mirtazapine (REMERON) 45 MG tablet Take 1 tablet (45 mg total) by mouth at bedtime. 30 tablet 1  . NARCAN 4 MG/0.1ML LIQD  nasal spray kit Place 1 spray into the nose once.     Marland Kitchen OLANZapine (ZYPREXA) 2.5 MG tablet Take 2.5 mg by mouth at bedtime.    Marland Kitchen oxyCODONE (ROXICODONE) 15 MG immediate release tablet Take 15 mg by mouth every other day as needed for pain.     . polyethylene glycol powder (GLYCOLAX/MIRALAX) 17 GM/SCOOP powder SMARTSIG:1 Scoopful By Mouth 4 Times Daily (Patient taking differently: Take 1 Container by mouth daily. SMARTSIG:1 Scoopful By Mouth 4 Times Daily) 255 g 1  . prednisoLONE acetate (PRED FORTE) 1 % ophthalmic suspension Place 1 drop into the right eye daily. 5 mL 1  . rosuvastatin (CRESTOR) 20 MG tablet TAKE 1 TABLET(20 MG) BY MOUTH AT BEDTIME 30 tablet 2  . traZODone (DESYREL) 100 MG tablet Take 300 mg by mouth at bedtime.     No current facility-administered medications on file prior to visit.    Family History  Problem Relation Age of Onset  . Heart disease Mother 63  . Hypertension Mother   . CVA Mother   . Heart disease Father 64  . CVA Sister   . Heart disease Sister   . Aneurysm Sister   . CVA Brother   . Aneurysm Brother   . CVA Maternal Grandmother   . CVA Maternal Grandfather   . Hypertension Brother   . Colon cancer Neg Hx   . Esophageal cancer Neg Hx   . Stomach cancer Neg Hx     Social History   Socioeconomic History  . Marital status: Divorced    Spouse name: Not on file  . Number of children: 1  . Years of education: 61  . Highest education level: Not on file  Occupational History  . Occupation: Disabled  Tobacco Use  . Smoking status: Current Every Day Smoker    Packs/day: 0.50    Years: 39.00    Pack years: 19.50    Types: Cigarettes    Start date: 70  . Smokeless tobacco: Current User    Types: Chew  . Tobacco comment: 6 cigs per day  Vaping Use  . Vaping Use: Never used  Substance and Sexual Activity  . Alcohol use: No    Alcohol/week: 0.0 standard drinks  . Drug use: No  . Sexual activity: Not Currently    Birth control/protection:  Surgical  Other Topics Concern  . Not on file  Social History Narrative   Current Social History 08/18/2020        Patient lives  alone in a ground floor apartment which is 1 story. There are 13 steps with handrail up to the entrance the patient uses.       Patient's method of transportation is [facility] transportation.      The highest level of education was 2 years college.      The patient currently disabled.      Identified important Relationships are "My daughter, brothers, grands and great-grands."       Pets : chihuahua Chief Executive Officer)       Interests / Fun: "I don't"       Current Stressors: "Nothing"       Religious / Personal Beliefs: '[employer]"       Other: "I am a very loving person"      L. [place], BSN, RN-BC        Social Determinants of Health   Financial Resource Strain: Not on file  Food Insecurity: Not on file  Transportation Needs: Not on file  Physical Activity: Not on file  Stress: Not on file  Social Connections: Unknown  . Frequency of Communication with Friends and Family: Three times a week  . Frequency of Social Gatherings with Friends and Family: Not on file  . Attends Religious Services: Not on file  . Active Member of Clubs or Organizations: Not on file  . Attends Archivist Meetings: Not on file  . Marital Status: Divorced  Human resources officer Violence: Not on file    Review of Systems: ROS negative except for what is noted on the assessment and plan.  Vitals:   01/05/21 1046  BP: 113/78  Pulse: 74  Temp: 98.4 F (36.9 C)  TempSrc: Oral  SpO2: 100%  Weight: 162 lb 12.8 oz (73.8 kg)     Physical Exam: Physical Exam Constitutional:      Appearance: Normal appearance.  HENT:     Head: Normocephalic and atraumatic.  Eyes:     Extraocular Movements: Extraocular movements intact.  Cardiovascular:     Rate and Rhythm: Normal rate.     Pulses: Normal pulses.     Heart sounds: Normal heart sounds.  Pulmonary:     Effort:  Pulmonary effort is normal.     Breath sounds: Normal breath sounds.  Abdominal:     General: Bowel sounds are normal.     Palpations: Abdomen is soft.     Tenderness: There is no abdominal tenderness.  Musculoskeletal:        General: Normal range of motion.     Cervical back: Normal range of motion.     Right lower leg: No edema.     Left lower leg: No edema.  Skin:    General: Skin is warm and dry.  Neurological:     Mental Status: She is alert and oriented to person, place, and time. Mental status is at baseline.  Psychiatric:        Mood and Affect: Mood normal.      Assessment & Plan:   See Encounters Tab for problem based charting.  Patient discussed with Dr. Eulogio Ditch, D.O. [facility], PGY-2 Pager: [phone], Phone: [phone] Date 01/06/2021 Time 4:32 PM

## 2021-01-05 NOTE — Patient Instructions (Signed)
Thank you, Ms.Judine L Hinderliter for allowing [place] to provide your care today. Today we discussed Diabetes.    I have ordered the following labs for you:   Lab Orders     Glucose, capillary   Tests ordered today:  None  Referrals ordered today:   Referral Orders  No referral(s) requested today     I have ordered the following medication/changed the following medications:   Stop the following medications: Medications Discontinued During This Encounter  Medication Reason  . insulin glargine (LANTUS SOLOSTAR) 100 UNIT/ML Solostar Pen      Start the following medications: Meds ordered this encounter  Medications  . insulin aspart (novoLOG) injection 5 Units  . insulin glargine (LANTUS SOLOSTAR) 100 UNIT/ML Solostar Pen    Sig: Inject 38 Units into the skin at bedtime.    Dispense:  15 mL    Refill:  1     Follow up: 1 week    Remember: take you blood sugar 4x daily and take your insulin.  Should you have any questions or concerns please call the internal medicine clinic at [phone].     Marianna Payment, D.O. [place]

## 2021-01-05 NOTE — Assessment & Plan Note (Addendum)
Patient presenting from appointment with diabetes coordinator with glucose of 473. She endorses increased urination and thirst. She has been drinking a lot of water. She denies dizziness, nausea, vomiting, abdominal pain, or dysuria. She has had occasional muscle twitching. No fever, chills, SOB, chest pain, no recent cuts.  She endorses taking her lantus 33U qhs, novolog 15U tid. When asked if she takes any other medication for diabetes she states "no," but when asked about trulicity she does endorse taking this once per week. When trying to discuss her diet she states, "zero sugar," but did not elaborate much.   She does not appear dehydrated or acutely ill. No signs of infection or alternative cause of her hyperglycemia. She recently was on much lower dose of lantus and novolog with good a1c and I am concerned she is having difficulty taking her insulin. She has demonstrated with [name] how to take her insulin before so I do not think education is the issue. We did discuss that her hyperglycemia is very likely what is causing her to urinate so much and getting her sugar down will help this resolve.   - stat bmp and UA without signs of DKA - given 10U of insulin with decrease of glucose to 423. - urine culture ordered  - continue lantus 33U qhs and novolog 15U tid, discussed how this will help her polyuria and polydipsia, see note above. - if glucose improved, can consider switching back to metformin (previously taken off of this due to diarrhea that was thought not to be related to metformin) and sglt-2. I am anticipating po medications may be easier for her adherence.  - discussed diet  - f/u in 3-4 days

## 2021-01-06 ENCOUNTER — Ambulatory Visit (INDEPENDENT_AMBULATORY_CARE_PROVIDER_SITE_OTHER): Payer: Medicare Other | Admitting: Cardiovascular Disease

## 2021-01-06 ENCOUNTER — Encounter: Payer: Self-pay | Admitting: Internal Medicine

## 2021-01-06 ENCOUNTER — Encounter: Payer: Self-pay | Admitting: Cardiovascular Disease

## 2021-01-06 VITALS — BP 122/68 | HR 72 | Ht 67.0 in | Wt 161.0 lb

## 2021-01-06 DIAGNOSIS — E785 Hyperlipidemia, unspecified: Secondary | ICD-10-CM

## 2021-01-06 DIAGNOSIS — I10 Essential (primary) hypertension: Secondary | ICD-10-CM | POA: Diagnosis not present

## 2021-01-06 DIAGNOSIS — Z8673 Personal history of transient ischemic attack (TIA), and cerebral infarction without residual deficits: Secondary | ICD-10-CM

## 2021-01-06 DIAGNOSIS — E1169 Type 2 diabetes mellitus with other specified complication: Secondary | ICD-10-CM

## 2021-01-06 NOTE — Assessment & Plan Note (Signed)
Patient presents for reevaluation of her hyperglycemia in the setting of her uncontrolled diabetes.  Patient states that she takes Lantus 33 units nightly and NovoLog 15 units 3 times daily.  Her last hemoglobin A1c was greater than 14.  She states that she is using her home glucometer and admits to being compliant with her insulin management.  Patient was previously seen by Dr. Sharon Seller and [name] our diabetic educator and was set up for a CGM.  She states that she has not had this installed yet but continues to have significantly elevated glucose levels in the 400s.  She does admit to associated polyuria polydipsia and fatigue.  Therefore, I will give her 5 units of NovoLog here in the clinic and adjust her home medications.     Plan: -Increase home Lantus to 38 units nightly -Continue NovoLog 15 units 3 times daily -Consult patient regarding hypoglycemic symptoms and to call us if she experiences any of them. -Patient was instructed to follow-up once CGM has been placed.

## 2021-01-06 NOTE — Progress Notes (Signed)
Internal Medicine Clinic Attending  Case discussed with Dr. Sharon Seller  At the time of the visit.  We reviewed the resident's history and exam and pertinent patient test results.  I agree with the assessment, diagnosis, and plan of care documented in the resident's note. Agree particularly with goal of minimizing frequency of insulin injections as she is transitioned (as possible) to oral medications.  This will help immensely with adherence.

## 2021-01-06 NOTE — Progress Notes (Signed)
Cardiology Office Note   Date:  01/06/2021   ID:  Susan Holmes, DOB 28-Feb-1956, MRN [id number]  PCP:  [name], MD  Cardiologist:   Skeet Latch, MD   No chief complaint on file.    History of Present Illness: Susan Holmes is a 65 y.o. female with diabetes mellitus type 2, hypertension, hyperlipidemia, non-obstructive CAD,  carotid stenosis, stroke and tobacco abuse who presents for follow up. Susan Holmes was seen 08/13/16 with chest pain. She  was referred for [facility] 08/24/16 that revealed LVEF 52% and was negative for ischemia. She was also noted to have a history of ischemic stroke and had not undergone TEE. She had a TEE on 08/17/16 that was negative for thrombus and did not reveal an ASD or PFO.  Carotid Doppler showed moderate L ECA stenosis and mild L ICA stenosis.  She was started on aspirin and atorvastatin 80 mg but this was stopped due to myalgias.  She did report frequent palpitations and had a 30 day event monitor placed that revealed occasional PACs and PVCs.  Susan Holmes previously had an echo 09/2014 that revealed LVEF 40-45% with diffuse hypokinesis and trace MR.  She had a nuclear stress test that showed LVEF 38% with a fixed apical defect.  She had a LHC at that time without obstructive coronary disease and her EF on left ventriculography was 55% and LVEDP was 10 mmHg.  She was referred to pulmonology and saw Dr. Vaughan Browner.  PFTs did not show clear obstruction.  She was started on Symbicort 04/2017.  She reported chest pain in 09/2018 and had a Lexiscan Myoview that revealed LVEF 57% with no ischemia.  Lately Susan Holmes has been feeling well.  Her BP has been well-controlled.  She hasn't been getting much exercise.  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  She is eager to start back exercising.  She has no exertional chest pain or shortness of breath.  She denies LE edema, orthopnea or PND.  She has been struggling with her blood sugars which  are running high.  She been smoking, maybe a little less.  She is interested in using the nicotine replacement gum.  Overall she has been feeling well.   Past Medical History:  Diagnosis Date  . Anxiety   . Arthritis   . Callus of foot 09/19/2018  . Chest pain 09/26/2018  . Claudication ([place]) 09/14/2019  . Congenital blindness    Left  eye  . Coronary artery disease    per cardiac cath 2015-- mild disease involving LAD and branches ,  LCFx and branches, and mild disease pRCA  . Cough 07/08/2016  . Depression   . Diabetes mellitus, type II ([place])    followed by pcp  . Glaucoma   . Glaucoma, both eyes   . Hepatitis B infection pt unsure   resolved  . History of kidney stones    right stone  . History of transient ischemic attack (TIA) 2017   per pt no residuals  . History of vitamin D deficiency 09/06/2017  . HTN (hypertension)    followed by pcp  . Hyperlipidemia   . PAD (peripheral artery disease) ([place])   . Paronychia of great toe, left 06/21/2019  . Poor dental hygiene   . Stroke ([place])   . Tobacco abuse    .5 PPD smoker  . Uncontrolled type II diabetes mellitus ([place]) 06/12/2007   Qualifier: Diagnosis of  By:  [place] MSN, FNP-C, Collie Siad      Past Surgical History:  Procedure Laterality Date  . ABDOMINAL AORTOGRAM W/LOWER EXTREMITY N/A 09/14/2019   Procedure: ABDOMINAL AORTOGRAM W/LOWER EXTREMITY;  Surgeon: Elam Dutch, MD;  Location: [place] [facility];  Service: Cardiovascular;  Laterality: N/A;  . ABDOMINAL HYSTERECTOMY  1980s   unsure if ovaries were taken  . BYPASS GRAFT Left 10/22/2019    Left femoral to above-knee popliteal bypass using reversed ipsilateral greater saphenous vein  . colonoscopy    . FEMORAL-POPLITEAL BYPASS GRAFT Left 10/22/2019   Procedure: LEFT FEMORAL-ABOVE KNEE POPLITEAL  BYPASS GRAFT with REVERSED GREATER SAPHENOUS VEIN;  Surgeon: Elam Dutch, MD;  Location: [place];  Service: Vascular;  Laterality: Left;  . GLAUCOMA SURGERY Right 2017  .  IR URETERAL STENT RIGHT NEW ACCESS W/O SEP NEPHROSTOMY CATH  10/03/2018  . LEFT HEART CATHETERIZATION WITH CORONARY ANGIOGRAM N/A 10/09/2014   Procedure: LEFT HEART CATHETERIZATION WITH CORONARY ANGIOGRAM;  Surgeon: Troy Sine, MD;  Location: [facility] LAB;  Service: Cardiovascular;  Laterality: N/A;  . NEPHROLITHOTOMY Right 10/03/2018   Procedure: RIGHT NEPHROLITHOTOMY PERCUTANEOUS FIRST STAGE;  Surgeon: Irine Seal, MD;  Location: [facility];  Service: Urology;  Laterality: Right;  . PERIPHERAL VASCULAR INTERVENTION Left 09/14/2019   Procedure: PERIPHERAL VASCULAR INTERVENTION;  Surgeon: Elam Dutch, MD;  Location: [place] [facility];  Service: Cardiovascular;  Laterality: Left;   left external iliac stent   . TEE WITHOUT CARDIOVERSION N/A 08/17/2016   Procedure: TRANSESOPHAGEAL ECHOCARDIOGRAM (TEE);  Surgeon: Sueanne Margarita, MD;  Location: [facility] ENDOSCOPY;  Service: Cardiovascular;  Laterality: N/A;    Current Outpatient Medications  Medication Sig Dispense Refill  . allopurinol (ZYLOPRIM) 100 MG tablet Take 1 tablet (100 mg total) by mouth daily. 90 tablet 1  . amLODipine (NORVASC) 10 MG tablet Take 1 tablet (10 mg total) by mouth daily. 90 tablet 3  . aspirin EC 81 MG tablet Take 1 tablet (81 mg total) by mouth daily. 90 tablet 1  . Blood Glucose Monitoring Suppl (ACCU-CHEK GUIDE ME) w/Device KIT Check blood sugar 3 times a day 1 kit 0  . busPIRone (BUSPAR) 7.5 MG tablet Take 7.5 mg by mouth 2 (two) times daily.     . carvedilol (COREG) 12.5 MG tablet TAKE 1 TABLET(12.5 MG) BY MOUTH TWICE DAILY 60 tablet 6  . Continuous Blood Gluc Receiver (FREESTYLE LIBRE 2 READER) DEVI Use to check blood sugar at least 6 times a day 1 each 0  . Continuous Blood Gluc Sensor (FREESTYLE LIBRE 2 SENSOR) MISC Use to check blood glucose at least 6 times a day 7 each 3  . diclofenac Sodium (VOLTAREN) 1 % GEL Apply 2 g topically daily as needed (pain).     . dorzolamide-timolol (COSOPT) 22.3-6.8 MG/ML ophthalmic  solution Place 1 drop into both eyes every evening. 10 mL 1  . Dulaglutide (TRULICITY) 1.5 MB/8.6LJ SOPN ADMINISTER 1.5 MG UNDER THE SKIN EVERY SATURDAY 6 mL 1  . DULoxetine (CYMBALTA) 30 MG capsule TAKE 2 CAPSULE(60 MG) BY MOUTH DAILY (Patient taking differently: Take 60 mg by mouth daily.) 90 capsule 2  . gabapentin (NEURONTIN) 300 MG capsule Take 300 mg by mouth 3 (three) times daily.    Marland Kitchen glucose blood (ACCU-CHEK GUIDE) test strip Check blood sugar 3 times per day 100 each 12  . hydrALAZINE (APRESOLINE) 10 MG tablet TAKE 1 TABLET(10 MG) BY MOUTH IN THE MORNING AND AT BEDTIME 180 tablet 1  . hydrochlorothiazide (HYDRODIURIL) 25  MG tablet Take 1 tablet (25 mg total) by mouth daily. 90 tablet 3  . insulin glargine (LANTUS SOLOSTAR) 100 UNIT/ML Solostar Pen Inject 38 Units into the skin at bedtime. 15 mL 1  . Insulin Pen Needle (VALUMARK PEN NEEDLES) 31G X 8 MM MISC 1 each by Does not apply route daily. 100 each 3  . Lancets (ACCU-CHEK SOFT TOUCH) lancets Check blood sugar 3 times a day 100 each 12  . lidocaine (XYLOCAINE) 5 % ointment Apply 1 application topically daily as needed for moderate pain.     Marland Kitchen LINZESS 145 MCG CAPS capsule Take 145 mcg by mouth daily.    Marland Kitchen losartan (COZAAR) 100 MG tablet Take 1 tablet (100 mg total) by mouth daily. 90 tablet 3  . mirtazapine (REMERON) 45 MG tablet Take 1 tablet (45 mg total) by mouth at bedtime. 30 tablet 1  . NARCAN 4 MG/0.1ML LIQD nasal spray kit Place 1 spray into the nose once.     Marland Kitchen OLANZapine (ZYPREXA) 2.5 MG tablet Take 2.5 mg by mouth at bedtime.    Marland Kitchen oxyCODONE (ROXICODONE) 15 MG immediate release tablet Take 15 mg by mouth every other day as needed for pain.     . polyethylene glycol powder (GLYCOLAX/MIRALAX) 17 GM/SCOOP powder SMARTSIG:1 Scoopful By Mouth 4 Times Daily (Patient taking differently: Take 1 Container by mouth daily. SMARTSIG:1 Scoopful By Mouth 4 Times Daily) 255 g 1  . prednisoLONE acetate (PRED FORTE) 1 % ophthalmic suspension  Place 1 drop into the right eye daily. 5 mL 1  . pregabalin (LYRICA) 150 MG capsule TAKE 1 CAPSULE BY MOUTH IN THE EVENING    . rosuvastatin (CRESTOR) 20 MG tablet TAKE 1 TABLET(20 MG) BY MOUTH AT BEDTIME 30 tablet 2  . tiZANidine (ZANAFLEX) 4 MG tablet TAKE 1 TABLET BY MOUTH THREE TIMES DAILY AS NEEDED. MAY BREAK IN HALF DURING DAY IF IT CAUSES YOU TO BE TOO SLEEPY.    . traZODone (DESYREL) 100 MG tablet Take 300 mg by mouth at bedtime.    . fluticasone (FLONASE) 50 MCG/ACT nasal spray Place 1 spray into both nostrils daily. (Patient taking differently: Place 1 spray into both nostrils daily as needed for allergies. ) 18.2 mL 0  . insulin aspart (NOVOLOG) 100 UNIT/ML injection Inject 15 Units into the skin 3 (three) times daily with meals. 13.5 mL 0   No current facility-administered medications for this visit.    Allergies:   Ace inhibitors, Lipitor [atorvastatin], and Chantix [varenicline]    Social History:  The patient  reports that she has been smoking cigarettes. She started smoking about 41 years ago. She has a 19.50 pack-year smoking history. Her smokeless tobacco use includes chew. She reports that she does not drink alcohol and does not use drugs.   Family History:  The patient's family history includes Aneurysm in her brother and sister; CVA in her brother, maternal grandfather, maternal grandmother, mother, and sister; Heart disease in her sister; Heart disease (age of onset: 45) in her father and mother; Hypertension in her brother and mother.    ROS:  Please see the history of present illness.   Otherwise, review of systems are positive for sinus congestion, clear phlegm.   All other systems are reviewed and negative.    PHYSICAL EXAM: VS:  BP 122/68   Pulse 72   Ht '5\' 7"'  (1.702 m)   Wt 161 lb (73 kg)   SpO2 97%   BMI 25.22 kg/m  , BMI Body  mass index is 25.22 kg/m. GENERAL:  Well appearing HEENT: Pupils equal round and reactive, fundi not visualized, oral mucosa  unremarkable NECK:  No jugular venous distention, waveform within normal limits, carotid upstroke brisk and symmetric, no bruits LUNGS:  Clear to auscultation bilaterally HEART:  RRR.  PMI not displaced or sustained,S1 and S2 within normal limits, no S3, no S4, no clicks, no rubs, II/VI systolic murmur at the LUSB ABD:  Flat, positive bowel sounds normal in frequency in pitch, no bruits, no rebound, no guarding, no midline pulsatile mass, no hepatomegaly, no splenomegaly EXT:  2 plus pulses throughout, no edema, no cyanosis no clubbing SKIN:  No rashes no nodules NEURO:  Cranial nerves II through XII grossly intact, motor grossly intact throughout PSYCH:  Cognitively intact, oriented to person place and time   EKG:  EKG is not ordered today. The ekg ordered 08/13/16 demonstrates sinus rhythm rate 83 bpm.  Non-specific ST-T changes. 2/8/220218: Sinus rhythm. Rate 76 bpm.  30 Day Event Monitor 09/01/16: Quality: Fair.  Baseline artifact. Average heart rate: 80 bpm Pauses >2.5 seconds: 0 Occasional PACs and PVCs  TEE 08/17/16: Study Conclusions  - Left ventricle: Systolic function was normal. The estimated   ejection fraction was in the range of 50% to 55%. Wall motion was   normal; there were no regional wall motion abnormalities. - Mitral valve: There was mild regurgitation. - Left atrium: No evidence of thrombus in the atrial cavity or   appendage. No evidence of thrombus in the atrial cavity or   appendage. - Right atrium: No evidence of thrombus in the atrial cavity or   appendage. - Tricuspid valve: There was trivial regurgitation. - Pulmonic valve: No evidence of vegetation. There was trivial   regurgitation.  Lexiscan Myoview 08/24/16:  The left ventricular ejection fraction is normal (55-65%).  The computer generated EF is 52%. Visually, the EF is closer to 60% and is normal.  There was no ST segment deviation noted during stress.  The study is normal.  This is a low  risk study.  Echo 09/2014: LVEF 40-45% with diffuse hypokinesis and trace MR.    Nuclear stress test 09/2014: LVEF 38% with a fixed apical defect. LHC 10/2014: No obstructive coronary disease.  EF on left ventriculography was 55% and LVEDP of 10 mmHg.    Carotid Doppler 07/24/16: >50% L ECA stenosis; 1-39% L ICA stenosis  Echo 07/24/16: LVEF 55-60%.  Restrictive physiology was noted.   Recent Labs: 10/14/2020: Hemoglobin 13.3; Platelets 229 01/02/2021: BUN 9; Creatinine, Ser 1.16; Potassium 4.7; Sodium 133    Lipid Panel    Component Value Date/Time   CHOL 147 08/18/2020 1417   TRIG 53 08/18/2020 1417   HDL 76 08/18/2020 1417   CHOLHDL 1.9 08/18/2020 1417   CHOLHDL 3.4 04/26/2017 1403   VLDL 31 (H) 04/26/2017 1403   LDLCALC 60 08/18/2020 1417   LDLDIRECT 183 (H) 03/01/2014 0500      Wt Readings from Last 3 Encounters:  01/06/21 161 lb (73 kg)  01/05/21 162 lb 12.8 oz (73.8 kg)  01/02/21 165 lb 6.4 oz (75 kg)      ASSESSMENT AND PLAN:  # Chest pain: LHC in 2015 revealed nonobstructive CAD.  She had a stress test in 2017 that was negative.  Symptoms resolved.   # Hypertension: # PVCs: Blood pressure well-controlled.  Continue losartan, amlodipine,carvedilol, HCTZ and hydralazine.  Creatinine stable.  Secondary Causes of Hypertension  Medications/Herbal: OCP, steroids, stimulants, antidepressants, weight loss medication, immune  suppressants, NSAIDs, sympathomimetics, alcohol, caffeine, licorice, ginseng, St. John's wort, chemo Sleep Apnea: Does not snore Renal artery stenosis: Negative 03/2020 Hyperaldosteronism:: No adenoma on CT 2018 Hyper/hypothyroidism: TSH normal 11/2019 Pheochromocytoma: (testing not indicated)  Cushing's syndrome: (testing not indicated)  Coarctation of the aorta: BP symmetric  # Hyperlipidemia: # Myalgias: Lipids well-controlled on rosuvastatin.  # Tobacco abuse: Ms. Eisinger is interested in nicorette gum.  We discussed the proper way to  use it.   # Stroke:  # Carotid stenosis: Continue aspirin and rosuvastatin.  # Shortness of breath: Stable.  Encouraged exercise.    Current medicines are reviewed at length with the patient today.  The patient does not have concerns regarding medicines.  The following changes have been made:  none Labs/ tests ordered today include:   No orders of the defined types were placed in this encounter.    Disposition:   FU with Michiko Lineman C. Oval Linsey, MD, [facility] in 1 year     Signed, Alynna Hargrove C. Oval Linsey, MD, [facility]  01/06/2021 11:37 AM    [place]

## 2021-01-06 NOTE — Patient Instructions (Signed)
Medication Instructions:  Your physician recommends that you continue on your current medications as directed. Please refer to the Current Medication list given to you today.   *If you need a refill on your cardiac medications before your next appointment, please call your pharmacy*  Lab Work: NONE   Testing/Procedures: NONE  Follow-Up: At [facility], you and your health needs are our priority.  As part of our continuing mission to provide you with exceptional heart care, we have created designated Provider Care Teams.  These Care Teams include your primary Cardiologist (physician) and Advanced Practice Providers (APPs -  Physician Assistants and Nurse Practitioners) who all work together to provide you with the care you need, when you need it.  We recommend signing up for the patient portal called "MyChart".  Sign up information is provided on this After Visit Summary.  MyChart is used to connect with patients for Virtual Visits (Telemedicine).  Patients are able to view lab/test results, encounter notes, upcoming appointments, etc.  Non-urgent messages can be sent to your provider as well.   To learn more about what you can do with MyChart, go to [email or web address]://[email or web address].com.    Your next appointment:   12 month(s) You will receive a reminder letter in the mail two months in advance. If you don't receive a letter, please call our office to schedule the follow-up appointment.   The format for your next appointment:   In Person  Provider:   You may see Tiffany Newville, MD or one of the following Advanced Practice Providers on your designated Care Team:    Luke Kilroy, PA-C  Callie Goodrich, PA-C  Jesse Cleaver, FNP    

## 2021-01-07 ENCOUNTER — Telehealth: Payer: Medicare Other

## 2021-01-07 ENCOUNTER — Ambulatory Visit (INDEPENDENT_AMBULATORY_CARE_PROVIDER_SITE_OTHER): Payer: Medicare Other | Admitting: Orthopedic Surgery

## 2021-01-07 DIAGNOSIS — M79605 Pain in left leg: Secondary | ICD-10-CM

## 2021-01-07 MED ORDER — CELECOXIB 100 MG PO CAPS: 100.0000 mg | ORAL_CAPSULE | Freq: Two times a day (BID) | ORAL | 0 refills | Status: DC

## 2021-01-08 ENCOUNTER — Telehealth: Payer: Medicare Other

## 2021-01-08 ENCOUNTER — Telehealth: Payer: Self-pay | Admitting: *Deleted

## 2021-01-08 NOTE — Telephone Encounter (Signed)
  Chronic Care Management   Outreach Note  01/08/2021 Name: Susan Holmes MRN: [id number] DOB: Sep 06, 1956  Referred by: [name], MD Reason for referral : Chronic Care Management ( IDDM, HTN, HLD,  PVD, left knee and leg pain)   An unsuccessful telephone outreach was attempted today. The patient was referred to the case management team for assistance with care management and care coordination.   Follow Up Plan: A HIPAA compliant phone message was left for the patient providing contact information and requesting a return call.  The care management team will reach out to the patient again over the next 7-14 days.   Kelli Churn RN, CCM, [facility] RN Care Manager [phone]

## 2021-01-08 NOTE — Progress Notes (Signed)
Internal Medicine Clinic Attending  Case discussed with Dr. Coe at the time of the visit.  We reviewed the resident's history and exam and pertinent patient test results.  I agree with the assessment, diagnosis, and plan of care documented in the resident's note.  Wilburt Messina, M.D., Ph.D.  

## 2021-01-09 ENCOUNTER — Ambulatory Visit: Payer: Medicare Other | Admitting: *Deleted

## 2021-01-09 ENCOUNTER — Other Ambulatory Visit: Payer: Self-pay | Admitting: Internal Medicine

## 2021-01-09 ENCOUNTER — Other Ambulatory Visit: Payer: Self-pay | Admitting: Student

## 2021-01-09 DIAGNOSIS — E1149 Type 2 diabetes mellitus with other diabetic neurological complication: Secondary | ICD-10-CM

## 2021-01-09 DIAGNOSIS — I10 Essential (primary) hypertension: Secondary | ICD-10-CM

## 2021-01-09 DIAGNOSIS — IMO0002 Reserved for concepts with insufficient information to code with codable children: Secondary | ICD-10-CM

## 2021-01-09 DIAGNOSIS — E119 Type 2 diabetes mellitus without complications: Secondary | ICD-10-CM

## 2021-01-09 DIAGNOSIS — E1169 Type 2 diabetes mellitus with other specified complication: Secondary | ICD-10-CM

## 2021-01-09 DIAGNOSIS — E1165 Type 2 diabetes mellitus with hyperglycemia: Secondary | ICD-10-CM

## 2021-01-09 MED ORDER — INSULIN LISPRO 100 UNIT/ML ~~LOC~~ SOLN: 15.0000 [IU] | Freq: Three times a day (TID) | SUBCUTANEOUS | 3 refills | Status: DC

## 2021-01-09 MED ORDER — VALUMARK PEN NEEDLES 31G X 8 MM MISC: 1.0000 | Freq: Every day | 3 refills | Status: DC

## 2021-01-09 NOTE — Progress Notes (Signed)
Called and spoke to patient. She states that she almost ran out of her Humalog insulin. Confirmed with patient that she is using Humalog, not NovoLog. Order sent to [facility].

## 2021-01-10 ENCOUNTER — Encounter: Payer: Self-pay | Admitting: Orthopedic Surgery

## 2021-01-10 NOTE — Progress Notes (Unsigned)
Office Visit Note   Patient: Susan Holmes           Date of Birth: [birth year]           MRN: [id number] Visit Date: 01/07/2021 Requested by: [name], MD [address] [place],  [place] [address] PCP: [name], MD  Subjective: Chief Complaint  Patient presents with  . Other     Scan review    HPI: Susan Holmes is a 65 y.o. female who presents to the office complaining of continued medial left knee pain.  She notes continued numbness and tingling in the left lower extremity with pain that seems to travel from the low back to the medial left knee.  She has associated low back pain.  Previous MRI of the left knee has been negative for any operative pathology to explain her pain.  She is here to review MRI lumbar spine.              ROS: All systems reviewed are negative as they relate to the chief complaint within the history of present illness.  Patient denies fevers or chills.  Assessment & Plan: Visit Diagnoses:  1. Pain in left leg     Plan: Patient is a 65 year old female who presents for evaluation of low back pain with left leg radicular pain.  Complains mainly of medial left knee pain.  Lumbar spine MRI was reviewed today and revealed mild lumbar spine spondylosis with no significant foraminal stenosis or canal stenosis.  Discussed option available to patient.  Patient may benefit from a lumbar spine ESI to see if that will alleviate her left knee symptoms but not an option at this time with her last hemoglobin A1c at greater than 14.0.  Prescribe Celebrex for symptomatic relief.  She will continue take gabapentin.  She will take Celebrex twice daily for 2 weeks and then once daily as needed after that.  Cautioned her against taking this every day after the initial 2 weeks due to her GFR of 52.  Follow-Up Instructions: No follow-ups on file.   Orders:  No orders of the defined types were placed in this encounter.  Meds ordered this encounter   Medications  . celecoxib (CELEBREX) 100 MG capsule    Sig: Take 1 capsule (100 mg total) by mouth 2 (two) times daily. Take twice daily for two weeks then once daily as needed after that    Dispense:  60 capsule    Refill:  0      Procedures: No procedures performed   Clinical Data: No additional findings.  Objective: Vital Signs: There were no vitals taken for this visit.  Physical Exam:  Constitutional: Patient appears well-developed HEENT:  Head: Normocephalic Eyes:EOM are normal Neck: Normal range of motion Cardiovascular: Normal rate Pulmonary/chest: Effort normal Neurologic: Patient is alert Skin: Skin is warm Psychiatric: Patient has normal mood and affect  Ortho Exam: Ortho exam demonstrates 5/5 motor strength of bilateral hip flexors, quadricep, hamstring, dorsiflexion, plantarflexion.  Sensation intact all dermatomes of bilateral lower extremities except for reduced in the medial left thigh.  Tenderness throughout the axial lumbar spine and left buttocks.  Specialty Comments:  No specialty comments available.  Imaging: No results found.   PMFS History: Patient Active Problem List   Diagnosis Date Noted  . Delayed gastric emptying 12/31/2020  . Erosive gastropathy 12/31/2020  . Irregular bowel habits 12/31/2020  . Long term (current) use of insulin ([place]) 12/31/2020  . Musculoskeletal  symptom 12/31/2020  . Nausea 12/31/2020  . Standard chest x-ray abnormal 12/31/2020  . Bilateral impacted cerumen 12/09/2020  . Sensorineural hearing loss (SNHL), bilateral 12/09/2020  . Fall 11/24/2020  . Hearing loss 10/23/2020  . Polypharmacy 09/09/2020  . Sinus congestion 10/11/2019  . PVD (peripheral vascular disease) ([place]) 10/04/2019  . Insulin dependent type 2 diabetes mellitus ([place]) 10/04/2019  . Reactive airway disease 06/20/2018  . Unintentional weight loss 06/14/2018  . History of CVA (cerebrovascular accident) 07/23/2016  . Osteoarthritis 12/11/2014  .  History of chest pain 10/07/2014  . Pre-operative cardiovascular examination 10/07/2014  . Liver hemangioma 04/04/2014  . RBC microcytosis 02/05/2014  . Hyperparathyroidism ([place]) 03/08/2013  . Calcific tendinitis of right shoulder 02/26/2012  . Hyperlipidemia associated with type 2 diabetes mellitus ([place]) 06/12/2007  . TOBACCO ABUSE 06/12/2007  . Depression 06/12/2007  . CONGENITAL BLINDNESS, LEFT EYE 06/12/2007  . Essential hypertension 06/12/2007   Past Medical History:  Diagnosis Date  . Anxiety   . Arthritis   . Callus of foot 09/19/2018  . Chest pain 09/26/2018  . Claudication ([place]) 09/14/2019  . Congenital blindness    Left  eye  . Coronary artery disease    per cardiac cath 2015-- mild disease involving LAD and branches ,  LCFx and branches, and mild disease pRCA  . Cough 07/08/2016  . Depression   . Diabetes mellitus, type II ([place])    followed by pcp  . Glaucoma   . Glaucoma, both eyes   . Hepatitis B infection pt unsure   resolved  . History of kidney stones    right stone  . History of transient ischemic attack (TIA) 2017   per pt no residuals  . History of vitamin D deficiency 09/06/2017  . HTN (hypertension)    followed by pcp  . Hyperlipidemia   . PAD (peripheral artery disease) ([place])   . Paronychia of great toe, left 06/21/2019  . Poor dental hygiene   . Stroke ([place])   . Tobacco abuse    .5 PPD smoker  . Uncontrolled type II diabetes mellitus ([place]) 06/12/2007   Qualifier: Diagnosis of  By: [place] MSN, FNP-C, Collie Siad      Family History  Problem Relation Age of Onset  . Heart disease Mother 60  . Hypertension Mother   . CVA Mother   . Heart disease Father 74  . CVA Sister   . Heart disease Sister   . Aneurysm Sister   . CVA Brother   . Aneurysm Brother   . CVA Maternal Grandmother   . CVA Maternal Grandfather   . Hypertension Brother   . Colon cancer Neg Hx   . Esophageal cancer Neg Hx   . Stomach cancer Neg Hx     Past Surgical History:   Procedure Laterality Date  . ABDOMINAL AORTOGRAM W/LOWER EXTREMITY N/A 09/14/2019   Procedure: ABDOMINAL AORTOGRAM W/LOWER EXTREMITY;  Surgeon: Elam Dutch, MD;  Location: [facility];  Service: Cardiovascular;  Laterality: N/A;  . ABDOMINAL HYSTERECTOMY  1980s   unsure if ovaries were taken  . BYPASS GRAFT Left 10/22/2019    Left femoral to above-knee popliteal bypass using reversed ipsilateral greater saphenous vein  . colonoscopy    . FEMORAL-POPLITEAL BYPASS GRAFT Left 10/22/2019   Procedure: LEFT FEMORAL-ABOVE KNEE POPLITEAL  BYPASS GRAFT with REVERSED GREATER SAPHENOUS VEIN;  Surgeon: Elam Dutch, MD;  Location: [facility];  Service: Vascular;  Laterality: Left;  . GLAUCOMA SURGERY Right 2017  . IR  URETERAL STENT RIGHT NEW ACCESS W/O SEP NEPHROSTOMY CATH  10/03/2018  . LEFT HEART CATHETERIZATION WITH CORONARY ANGIOGRAM N/A 10/09/2014   Procedure: LEFT HEART CATHETERIZATION WITH CORONARY ANGIOGRAM;  Surgeon: Troy Sine, MD;  Location[facility];  Service: Cardiovascular;  Laterality: N/A;  . NEPHROLITHOTOMY Right 10/03/2018   Procedure: RIGHT NEPHROLITHOTOMY PERCUTANEOUS FIRST STAGE;  Surgeon: Irine Seal, MD;  Location: [facility];  Service: Urology;  Laterality: Right;  . PERIPHERAL VASCULAR INTERVENTION Left 09/14/2019   Procedure: PERIPHERAL VASCULAR INTERVENTION;  Surgeon: Elam Dutch, MD;  Location: [facility];  Service: Cardiovascular;  Laterality: Left;   left external iliac stent   . TEE WITHOUT CARDIOVERSION N/A 08/17/2016   Procedure: TRANSESOPHAGEAL ECHOCARDIOGRAM (TEE);  Surgeon: Sueanne Margarita, MD;  Location: [facility]OSCOPY;  Service: Cardiovascular;  Laterality: N/A;   Social History   Occupational History  . Occupation: Disabled  Tobacco Use  . Smoking status: Current Every Day Smoker    Packs/day: 0.50    Years: 39.00    Pack years: 19.50    Types: Cigarettes    Start date: 65  . Smokeless tobacco: Current User    Types: Chew  .  Tobacco comment: 6 cigs per day  Vaping Use  . Vaping Use: Never used  Substance and Sexual Activity  . Alcohol use: No    Alcohol/week: 0.0 standard drinks  . Drug use: No  . Sexual activity: Not Currently    Birth control/protection: Surgical

## 2021-01-12 DIAGNOSIS — E1165 Type 2 diabetes mellitus with hyperglycemia: Secondary | ICD-10-CM | POA: Diagnosis not present

## 2021-01-13 NOTE — Chronic Care Management (AMB) (Addendum)
   01/09/2021  Susan Holmes 1956-06-06 [id number]  Successful outreach to patient to assess glycemic management . Reports good medication taking behavior but states her blood sugars are still in the 400's and that she is almost out of mealtime insulin. She states she does not have trouble seeing or using the insulin pens and says she is removing the inner needle cover prior to injection. Patient is aware she has an  appointment with Debera Lat on 01/15/21 to have personal CGM applied.  Plan: Called patient's pharmacy and verifies that she needs refill on mealtime insulin so contacted Dr. Matilde Bash , resident on call for the clinic, and he states he will refill patient's mealtime insulin.   Kelli Churn RN, CCM, [facility] RN Care Manager [phone]

## 2021-01-15 ENCOUNTER — Telehealth: Payer: Self-pay | Admitting: Dietician

## 2021-01-15 ENCOUNTER — Ambulatory Visit: Payer: Medicare Other | Admitting: Dietician

## 2021-01-15 NOTE — Telephone Encounter (Signed)
Susan Holmes was scheduled for personal Continuous glucose monitoring training today. She calls to cancel saying she has not gotten her Continuous glucose monitoring supplies yet.

## 2021-01-15 NOTE — Telephone Encounter (Signed)
Left a message asking patient to bring her personal Continuous glucose monitoring supplies top her appointment today.

## 2021-01-16 NOTE — Telephone Encounter (Signed)
Has her  Continuous glucose monitor and called to schedule an appointment.

## 2021-01-19 ENCOUNTER — Ambulatory Visit: Payer: Medicare Other | Admitting: Dietician

## 2021-01-19 DIAGNOSIS — H90A22 Sensorineural hearing loss, unilateral, left ear, with restricted hearing on the contralateral side: Secondary | ICD-10-CM | POA: Diagnosis not present

## 2021-01-19 DIAGNOSIS — H90A31 Mixed conductive and sensorineural hearing loss, unilateral, right ear with restricted hearing on the contralateral side: Secondary | ICD-10-CM | POA: Diagnosis not present

## 2021-01-20 NOTE — Telephone Encounter (Signed)
ERROR

## 2021-01-21 ENCOUNTER — Encounter: Payer: Self-pay | Admitting: Dietician

## 2021-01-21 ENCOUNTER — Ambulatory Visit (INDEPENDENT_AMBULATORY_CARE_PROVIDER_SITE_OTHER): Payer: Medicare Other | Admitting: Dietician

## 2021-01-21 DIAGNOSIS — E119 Type 2 diabetes mellitus without complications: Secondary | ICD-10-CM | POA: Diagnosis not present

## 2021-01-21 DIAGNOSIS — Z794 Long term (current) use of insulin: Secondary | ICD-10-CM

## 2021-01-21 DIAGNOSIS — Z713 Dietary counseling and surveillance: Secondary | ICD-10-CM

## 2021-01-21 NOTE — Patient Instructions (Signed)
Thank you for your visit today  Scan your sensor before meals and  when it alarms and before bedtime.  Please put [employer] support number in your phone to call them to replace a sensor that fall soff or malfuntions.   See you in 4 weeks- February 25 at 10:15 [place] [phone]

## 2021-01-21 NOTE — Progress Notes (Signed)
Diabetes Self-Management Education  Visit Type: Follow-up  Appt. Start Time: 10:25 Appt. End Time: 11:03  01/21/2021  [name], identified by name and date of birth, is a 65 y.o. female with a diagnosis of Diabetes: type 2  .   [employer] Personal CGM Training Susan Holmes was educated about the following:  -Getting to know device    Software engineer, how to charge, understanding icons ) -Setting up device, trend arrows, when to check blood sugar - sensor interaction with vitamin C: do not take more than 500 mg vitamin C daily -Setting alert profile (high alert  24 , low alert 85. Signal loss is on)  setting up reminders (reminders for 6x/day testing ) -Inserting sensor (patient applied the sensor on her right upper back of arm  himself today with minimal assist. It appeared to be working and in warm up when he left the office) -Calibrating- none required. - using meter when test blood sugar symbol appears -Ending sensor session -Trouble shooting -Tape guide, ability to upload from home with email address   -Reviewed insulin dosing from [place]   Patient has [name] tech support and my contact information. Follow up was arranged in 2 weeks   ASSESSMENT   Diabetes Self-Management Education - 01/21/21 1100      Visit Information   Visit Type Follow-up      Psychosocial Assessment   Patient Belief/Attitude about Diabetes Motivated to manage diabetes    Self-care barriers Lack of material resources    Self-management support Family    Patient Concerns Monitoring    Learning Readiness Ready    How often do you need to have someone help you when you read instructions, pamphlets, or other written materials from your doctor or pharmacy? 3 - Sometimes      Pre-Education Assessment   Patient understands the diabetes disease and treatment process. Demonstrates understanding / competency    Patient understands incorporating nutritional management  into lifestyle. Needs Review    Patient undertands incorporating physical activity into lifestyle. Needs Review    Patient understands using medications safely. Demonstrates understanding / competency    Patient understands monitoring blood glucose, interpreting and using results Needs Instruction    Patient understands prevention, detection, and treatment of acute complications. Needs Review    Patient understands prevention, detection, and treatment of chronic complications. Demonstrates understanding / competency    Patient understands how to develop strategies to address psychosocial issues. Needs Review    Patient understands how to develop strategies to promote health/change behavior. Needs Review      Dietary Intake   Breakfast Skips breakfast    Lunch skips    Dinner chicken and mashed potatoes      Exercise   Exercise Type ADL's      Patient Education   Monitoring Other (comment)   CGM placement (see note for details)   Personal strategies to promote health Helped patient develop diabetes management plan for (enter comment)   How to use CGM data for better self management     Individualized Goals (developed by patient)   Monitoring  test my blood glucose as discussed      Outcomes   Expected Outcomes Demonstrated interest in learning. Expect positive outcomes    Future DMSE Other (comment)   2 wks and 4 wks   Program Status Not Completed      Subsequent Visit   Since your last visit have you continued or begun to take your  medications as prescribed? Yes    Since your last visit, are you checking your blood glucose at least once a day? Yes           Individualized Plan for Diabetes Self-Management Training:   Learning Objective:  Patient will have a greater understanding of diabetes self-management. Patient education plan is to attend individual and/or group sessions per assessed needs and concerns.   Plan:   Patient Instructions   Thank you for your visit  today  Scan your sensor before meals and  when it alarms and before bedtime.  Please put [employer] support number in your phone to call them to replace a sensor that fall soff or malfuntions.   See you in 4 weeks- February 25 at 10:15 Am  [name] [phone]    Expected Outcomes:  Demonstrated interest in learning. Expect positive outcomes  Education material provided: Diabetes Resources  If problems or questions, patient to contact team via:  Phone  Future DSME appointment: Other (comment) (2 wks and 4 wks)   Debera Lat, RD  01/21/2021 11:37 AM.

## 2021-01-27 ENCOUNTER — Telehealth: Payer: Medicare Other

## 2021-01-30 ENCOUNTER — Ambulatory Visit: Payer: Medicare Other | Admitting: Dietician

## 2021-02-02 DIAGNOSIS — F17210 Nicotine dependence, cigarettes, uncomplicated: Secondary | ICD-10-CM | POA: Diagnosis not present

## 2021-02-02 DIAGNOSIS — M11262 Other chondrocalcinosis, left knee: Secondary | ICD-10-CM | POA: Diagnosis not present

## 2021-02-02 DIAGNOSIS — Z86718 Personal history of other venous thrombosis and embolism: Secondary | ICD-10-CM | POA: Diagnosis not present

## 2021-02-02 DIAGNOSIS — Z79899 Other long term (current) drug therapy: Secondary | ICD-10-CM | POA: Diagnosis not present

## 2021-02-02 DIAGNOSIS — G8929 Other chronic pain: Secondary | ICD-10-CM | POA: Diagnosis not present

## 2021-02-02 DIAGNOSIS — F17200 Nicotine dependence, unspecified, uncomplicated: Secondary | ICD-10-CM | POA: Diagnosis not present

## 2021-02-02 DIAGNOSIS — M25562 Pain in left knee: Secondary | ICD-10-CM | POA: Diagnosis not present

## 2021-02-04 ENCOUNTER — Ambulatory Visit (INDEPENDENT_AMBULATORY_CARE_PROVIDER_SITE_OTHER): Payer: Medicare Other | Admitting: Dietician

## 2021-02-04 ENCOUNTER — Other Ambulatory Visit: Payer: Self-pay

## 2021-02-04 ENCOUNTER — Encounter: Payer: Self-pay | Admitting: Dietician

## 2021-02-04 DIAGNOSIS — Z713 Dietary counseling and surveillance: Secondary | ICD-10-CM | POA: Diagnosis not present

## 2021-02-04 DIAGNOSIS — E119 Type 2 diabetes mellitus without complications: Secondary | ICD-10-CM

## 2021-02-04 DIAGNOSIS — Z794 Long term (current) use of insulin: Secondary | ICD-10-CM | POA: Diagnosis not present

## 2021-02-04 NOTE — Patient Instructions (Signed)
Dear Susan Holmes,   Thank you for your call today!  Please keep doing the great job checking your blood sugar! I am so happy the Continuous glucose monitor is working for you.  We can talk more about what could be making your blood sugar go up and down when you come to the office and we can see your information form you Continuous glucose monitoring.

## 2021-02-04 NOTE — Progress Notes (Signed)
Diabetes Self-Management Education  Visit Type: Follow-up  Appt. Start Time: 10:25 Appt. End Time: 11:03  02/04/2021  Susan Holmes, identified by name and date of birth, is a 65 y.o. female with a diagnosis of Diabetes: type 2  .   [employer] Personal CGM Training follow up by phone: Ms Giarratano successfully stopped her sensor an started a new one on Sunday.   This is a telephone encounter between [name]  and [name] Plyler   on 02/04/2021 for Medical Nutrition Therapy . The visit was conducted with the patient located at home and Debera Lat  at [facility]. The patient's identity was confirmed using their DOB and current address. The patient has consented to being evaluated through a telephone encounter and understands the associated risks / benefits (allows the patient to remain at home, decreasing exposure to coronavirus). I personally spent 38 minutes on medical nutrition therapy discussion.   The following statements were read to the patient and/or legal guardian that are established with the Nutritional Health Provider.   "The purpose of this phone visit is to provide behavioral health care while limiting exposure to the coronavirus (COVID19).  There is a possibility of technology failure and discussed alternative modes of communication if that failure occurs."   "By engaging in this telephone visit, you consent to the provision of healthcare.  Additionally, you authorize for your insurance to be billed for the services provided during this telephone visit."    Patient and/or legal guardian consented to telephone visit: "yes"  Put a new sensor on Sunday and it is working She reports her blood sugar is Up and down  She states it dropped 2 times to 78, does not know what caused this it was in the evening and her symptoms was feeling nervous. Wanding/ checking 4-5 times a day Using the Trend arrows so she knows to eat less/more Alarms- working, they go off about  twice a day  for both highs and lows  Last night she reports that Her blood sugar was 340 at 8 pm with trend arrow up to the right, pre-dinner reading was  (300s) 4 pm- steak and greens beans, 6 pm- drank flavored water Tries stay away from starches.  Diabetes medicines reported: Lantus 30 units,  humalog 15 units around 3 pm- 30 minutes before eating humalog 15 units 12 noon- but eats breakfast at 930 am- cornflakes.  at this point it was hard to hear her and there was a lot of background noise. We decided to follow up in the office later this month  At future visit address:  timing of insulin with food Physical activity and address psychological issues - using meter when test blood sugar symbol appears- using trend arrow to dose insulin   ASSESSMENT   Diabetes Self-Management Education - 02/04/21 1000      Visit Information   Visit Type Follow-up      Patient Education   Nutrition management  Meal timing in regards to the patients' current diabetes medication.      Patient Self-Evaluation of Goals - Patient rates self as meeting previously set goals (% of time)   Monitoring 50 - 75 %      Outcomes   Expected Outcomes Demonstrated interest in learning. Expect positive outcomes    Future DMSE 2 wks    Program Status Not Completed         Individualized Plan for Diabetes Self-Management Training:   Learning Objective:  Patient will have a  greater understanding of diabetes self-management. Patient education plan is to attend individual and/or group sessions per assessed needs and concerns.   Plan:   There are no Patient Instructions on file for this visit.  Expected Outcomes:  Demonstrated interest in learning. Expect positive outcomes  Education material provided: Diabetes Resources  If problems or questions, patient to contact team via:  Phone  Future DSME appointment: 2 wks   Debera Lat, RD  02/04/2021 4:17 PM.

## 2021-02-09 ENCOUNTER — Telehealth: Payer: Self-pay | Admitting: *Deleted

## 2021-02-09 ENCOUNTER — Telehealth: Payer: Medicare Other

## 2021-02-09 NOTE — Telephone Encounter (Signed)
  Chronic Care Management   Outreach Note  02/09/2021 Name: Susan Holmes MRN: [id number] DOB: 01-28-1956  Referred by: [name], MD Reason for referral : Chronic Care Management ( IDDM, HTN, HLD,  PVD, left knee and leg pain, mild to moderate hearing loss)   An unsuccessful telephone outreach was attempted today. The patient was referred to the case management team for assistance with care management and care coordination.     Follow Up Plan: A HIPAA compliant phone message was left for the patient providing contact information and requesting a return call.  The care management team will reach out to the patient again over the next 7-14 days.   Kelli Churn RN, CCM, [facility] RN Care Manager [phone]

## 2021-02-12 DIAGNOSIS — E1165 Type 2 diabetes mellitus with hyperglycemia: Secondary | ICD-10-CM | POA: Diagnosis not present

## 2021-02-14 ENCOUNTER — Other Ambulatory Visit: Payer: Self-pay | Admitting: Internal Medicine

## 2021-02-16 ENCOUNTER — Telehealth: Payer: Self-pay

## 2021-02-16 NOTE — Telephone Encounter (Signed)
Pt called triage with c/o L knee pain. States there is visible swelling on side of L knee with numbness of LLE x "several days". Pt denies any injury to the area and has not experienced this before. Per APP, we have scheduled pt for ABI's and LLE arterial duplex and to see APP this week. Pt is aware of this appt.

## 2021-02-17 ENCOUNTER — Other Ambulatory Visit: Payer: Self-pay

## 2021-02-17 DIAGNOSIS — I779 Disorder of arteries and arterioles, unspecified: Secondary | ICD-10-CM

## 2021-02-18 ENCOUNTER — Encounter (HOSPITAL_COMMUNITY): Payer: Medicare Other

## 2021-02-18 ENCOUNTER — Ambulatory Visit (HOSPITAL_COMMUNITY): Payer: Medicare Other

## 2021-02-18 ENCOUNTER — Ambulatory Visit: Payer: Medicare Other

## 2021-02-19 ENCOUNTER — Other Ambulatory Visit: Payer: Self-pay | Admitting: Student

## 2021-02-19 ENCOUNTER — Ambulatory Visit (INDEPENDENT_AMBULATORY_CARE_PROVIDER_SITE_OTHER): Payer: Medicare Other | Admitting: Dietician

## 2021-02-19 ENCOUNTER — Encounter: Payer: Self-pay | Admitting: Dietician

## 2021-02-19 ENCOUNTER — Telehealth: Payer: Self-pay | Admitting: Dietician

## 2021-02-19 DIAGNOSIS — Z794 Long term (current) use of insulin: Secondary | ICD-10-CM | POA: Diagnosis not present

## 2021-02-19 DIAGNOSIS — E119 Type 2 diabetes mellitus without complications: Secondary | ICD-10-CM

## 2021-02-19 DIAGNOSIS — Z713 Dietary counseling and surveillance: Secondary | ICD-10-CM | POA: Diagnosis not present

## 2021-02-19 MED ORDER — INSULIN PEN NEEDLE 32G X 4 MM MISC: 3 refills | Status: DC

## 2021-02-19 MED ORDER — INSULIN LISPRO (1 UNIT DIAL) 100 UNIT/ML (KWIKPEN): 15.0000 [IU] | PEN_INJECTOR | Freq: Three times a day (TID) | SUBCUTANEOUS | 11 refills | Status: DC

## 2021-02-19 NOTE — Telephone Encounter (Signed)
Order placed for Humalog KwikPen

## 2021-02-19 NOTE — Progress Notes (Signed)
Diabetes Self-Management Education  Visit Type: Follow-up  Appt. Start Time: 10:40 Appt. End Time: 11:10  02/19/2021  [name], identified by name and date of birth, is a 65 y.o. female with a diagnosis of Diabetes: type 2  .   [employer] Personal CGM Training follow up:  [name] presented with a box of used sensor applicators. She was having trouble applying them, but finally got one applied. She called the company and they are mailing her 2 more sensors.   She reports her blood sugar is staying high and she wonders if it something she is eating   Wanding/ checking 4 times a day  CGM Results from download:   % Time CGM active:   76 %   (Goal >70%)  Average glucose:   380 mg/dL for 14 days  Glucose management indicator:   12.4 %  Time in range (70-180 mg/dL):   0 %   (Goal >70%)  Time High (181-250 mg/dL):   2 %   (Goal < 25%)  Time Very High (>250 mg/dL):    98 %   (Goal < 5%)  Time Low (54-69 mg/dL):   0 %   (Goal <4%)  Time Very Low (<54 mg/dL):   0 %   (Goal <1%)  Coefficient of variation:   10.8 %   (Goal <36%)   Ms. Shadowens denied symptoms of hyperglycemia at first, but when prompted agreed she is thirsty, peeing a lot and tired.   10 Pm sleep to 9 AM- she reports no problems with her sleep  Diabetes medicines reported: had her demonstrate using trulcity , lantus and humalog pens. She reports tkaing Lantus 38 units ~ 8 Pm every night, last dose was last night, last dose of Humalog was yesterday afternoon. She reports daily taking:  humalog 15 units before first meal of day humalog 15 units Before second meal. Trulicity on Sundays- she was pulling the demo pen away before waiting for the second click  At future visit address: high blood sugars Asked patient to bring all of her medicines Physical activity and psychological issues - using meter when test blood sugar symbol appears- using trend arrow to dose insulin   ASSESSMENT   Diabetes  Self-Management Education - 02/19/21 1400      Visit Information   Visit Type Follow-up      Dietary Intake   Breakfast 9am half cup coffee w/ liquid sweet creamer, 930-10 breakfast shake or average size bowl of cornflakes    Snack (afternoon) 4Pm steak or chicken Hungry man dinner    Dinner 6-7 Pm chips ~2 servings    Snack (evening) 8 PMsugar free ice cream bar    Beverage(s) diet soda      Subsequent Visit   Since your last visit have you continued or begun to take your medications as prescribed? No   pulling truclity away before 2nd click   Since your last visit have you had your blood pressure checked? No    Since your last visit have you experienced any weight changes? No change   she did not want to weigh today   Since your last visit, are you checking your blood glucose at least once a day? Yes         Individualized Plan for Diabetes Self-Management Training:   Learning Objective:  Patient will have a greater understanding of diabetes self-management. Patient education plan is to attend individual and/or group sessions per assessed needs and concerns.  Plan:   Patient Instructions  Changes to help lower your blood sugar:   Try eating more vegetables (use sweet potatoes if you want potatoes)  and chicken, [place] fish that you have baked boiled, rotisserie and do not eat the skin  Drink lot's of water even if it feels like you are [peeing too much- keep drinking water.   Breakfast oatmeal, greek yogurt, peanut butter sandwich on whole wheat or rye bread or eggs are better on blood sugars than corn flakes.  Make a doctor appointment as soon as possible and me when you get your sensors  Susan Holmes [phone]    Expected Outcomes:     Education material provided: Diabetes Resources  If problems or questions, patient to contact team via:  Phone  Future DSME appointment:     Debera Lat, RD  02/19/2021 3:08 PM.

## 2021-02-19 NOTE — Telephone Encounter (Signed)
I called patient to follow up from her visit today:  1-  I encouraged her to either eat  lower carb snacks (other than chips and ice cream)  or take her third dose of Humalog 15 units before she eats them. She said she would prefer to take the insulin. 2- She is using Humalog pens and requests a refill. (current prescription is for vials)  3- asked her if I could ask the front office to see if they can find an appointment in March rather than April as I am concerned about her very high blood sugars. She agreed to this.  4- request shorter pen needles to be sure she is injecting insulin subcutaneously.   She was also asked to bring all of her medicines to the office at her next visit

## 2021-02-19 NOTE — Patient Instructions (Signed)
Changes to help lower your blood sugar:   Try eating more vegetables (use sweet potatoes if you want potatoes)  and chicken, [place] fish that you have baked boiled, rotisserie and do not eat the skin  Drink lot's of water even if it feels like you are [peeing too much- keep drinking water.   Breakfast oatmeal, greek yogurt, peanut butter sandwich on whole wheat or rye bread or eggs are better on blood sugars than corn flakes.  Make a doctor appointment as soon as possible and me when you get your sensors  [name] [phone]

## 2021-02-20 ENCOUNTER — Ambulatory Visit: Payer: Medicare Other | Admitting: Dietician

## 2021-02-20 ENCOUNTER — Telehealth: Payer: Self-pay

## 2021-02-20 ENCOUNTER — Other Ambulatory Visit: Payer: Self-pay

## 2021-02-20 ENCOUNTER — Ambulatory Visit (INDEPENDENT_AMBULATORY_CARE_PROVIDER_SITE_OTHER): Payer: Medicare Other | Admitting: Internal Medicine

## 2021-02-20 DIAGNOSIS — E119 Type 2 diabetes mellitus without complications: Secondary | ICD-10-CM | POA: Diagnosis not present

## 2021-02-20 DIAGNOSIS — Z794 Long term (current) use of insulin: Secondary | ICD-10-CM

## 2021-02-20 DIAGNOSIS — R29898 Other symptoms and signs involving the musculoskeletal system: Secondary | ICD-10-CM | POA: Diagnosis not present

## 2021-02-20 MED ORDER — LANTUS SOLOSTAR 100 UNIT/ML ~~LOC~~ SOPN: 45.0000 [IU] | PEN_INJECTOR | Freq: Every day | SUBCUTANEOUS | 1 refills | Status: DC

## 2021-02-20 MED ORDER — METFORMIN HCL ER 500 MG PO TB24: 500.0000 mg | ORAL_TABLET | Freq: Every day | ORAL | 1 refills | Status: DC

## 2021-02-20 NOTE — Telephone Encounter (Signed)
Requesting to speak with a nurse, please call pt back.  

## 2021-02-20 NOTE — Assessment & Plan Note (Signed)
(  Televisit)  Personal CGM read for 14 days 2/11-2/24/2022 shows consistently very high blood glucose (very high: 98% of time).  Very high: 98% High 2% Target range 0% Low 0% Very low 0%  Last HbA1c 2 months ago was 14.  Current anti-glycemic regimen: Lantus 38 u, NovoLog 15 three times daily (patient used twice daily.  Changed to 3 times daily per [name] yesterday), Trulicity [id number] called clinic today report weakness of her legs last night. Not on Metformin currently  Un-controled ID DM2 with hyperglycemia. Patient lives in high numbers that has gradually been worse.  She endorses some polydipsia and polyuria but has been going for about a week and she denies any worsening.  She and some bilateral leg weakness last night that resolved. Denies any nausea vomiting or abdominal pain.  And believes that she is at her normal base now. I had a long conversation with her to make sure she is not indicated for ED visit now.  She states that she is feeling well today.  Her weakness resolved and she is able to eat and drink well. Does not seems to have acute symptomatic hypoglycemia to warrant ED visit now. Will increase her Lantus to 45 units daily, instructed to inject NovoLog 15 units 3 times daily rather than twice daily, restart her on low-dose Metformin.  And to follow-up next week.  At long-term, may consider going up on Trulicity and consider SGLT2 inhibitor.   Plan:  -Crease Lantus from 38 to 45 minutes -Start Metformin XR 500 mg daily and escalate at next visit if tolerates -NovoLog 15 units, 3 times daily with meal -Continue Trulicity 1.5 weekly -ED visit precautions reviewed with patient.  She verbalizes understanding that if she develops any abdominal pain, nausea, vomiting, poor p.o. intake, weakness, she should go to emergency room.

## 2021-02-20 NOTE — Assessment & Plan Note (Addendum)
(  Televisit) Ms. Fencl reports she had bilateral leg weekness and some dizziness last night. She denies any associated numbness of tingling.  She states that she had the symptoms before and it felt like her legs cannot hold her weight and she feels shaky a little bit.  She check her blood sugar and it was high.  She did not check her blood pressure. She went to bed to get some rest and she states that she woke up normal this morning.  No further weakness and she is doing completely fine it at her baseline.  She has been able to walk as before. She denies any facial droop difficulty in speech.  She has had chronic numbness of left leg but no new neurologic symptoms this morning.  I asked her to check her muscle strength for me and she states that she is able to fist strongly and move her shoulders and walk as before without any weakness of her legs.  I do not think she had a stroke or even a TIA last night. (She is already on aspirin and Statin any ways). She states that she has been doing well since this morning. I instructed her to seek medical attention if her symptoms re occure. She agrees with that. Her blood sugar was also high (which has been her baseline) at the time she felt weak and shaky so no hypoglycemic event.  As she has uncontrolled DM, I also explained to her that DKA can present with weakness.  Her symptoms resolved and she denies any N/V, abdominal pain, weakness currently so I am not concern of DKA to send her to ED now.  -Asymptomatic now -Instructed to monitor her symptoms meanwhile and goes to ED if her symptoms re occur or if any sever new symptoms occur. Strict ER precautions reviewed with patient -F/u in clinic early next week

## 2021-02-20 NOTE — Progress Notes (Signed)
  [facility] Residency Telephone Encounter Continuity Care Appointment  HPI:   This telephone encounter was created for [name] on 02/20/2021 for the following purpose/cc leg weakness. Please refer to problem base charting for further details and assessment and plan.  Past medical history: HTN, insulin-dependent uncontrolled DM 2, CVA, HLD, liver hemangioma, hyperparathyroidism, tobacco abuse, depression, irregular bowel habits  Medications: Allopurinol, amlodipine, aspirin, bupropion, carvedilol, celecoxib, Trulicity, Cymbalta, hydralazine, HCTZ, Lantus, NovoLog, losartan, mirtazapine, olanzapine, oxycodone, pregabalin, rosuvastatin, tizanidine, trazodone   Past Medical History:  Past Medical History:  Diagnosis Date  . Anxiety   . Arthritis   . Callus of foot 09/19/2018  . Chest pain 09/26/2018  . Claudication ([place]) 09/14/2019  . Congenital blindness    Left  eye  . Coronary artery disease    per cardiac cath 2015-- mild disease involving LAD and branches ,  LCFx and branches, and mild disease pRCA  . Cough 07/08/2016  . Depression   . Diabetes mellitus, type II ([place])    followed by pcp  . Glaucoma   . Glaucoma, both eyes   . Hepatitis B infection pt unsure   resolved  . History of kidney stones    right stone  . History of transient ischemic attack (TIA) 2017   per pt no residuals  . History of vitamin D deficiency 09/06/2017  . HTN (hypertension)    followed by pcp  . Hyperlipidemia   . PAD (peripheral artery disease) ([place])   . Paronychia of great toe, left 06/21/2019  . Poor dental hygiene   . Stroke ([place])   . Tobacco abuse    .5 PPD smoker  . Uncontrolled type II diabetes mellitus ([place]) 06/12/2007   Qualifier: Diagnosis of  By: [place] MSN, FNP-C, Sue        ROS:      Assessment / Plan / Recommendations:   Please see A&P under problem oriented charting for assessment of the patient's acute and chronic medical conditions.   As  always, pt is advised that if symptoms worsen or new symptoms arise, they should go to an urgent care facility or to to ER for further evaluation.   Consent and Medical Decision Making:   Patient discussed with Dr. Evette Doffing  This is a telephone encounter between MARCIEL OFFENBERGER and [place] on 02/20/2021 for legs weakness that occurred last night. The visit was conducted with the patient located at home and [facility] at [facility]. The patient's identity was confirmed using their DOB and current address. The patient has consented to being evaluated through a telephone encounter and understands the associated risks (an examination cannot be done and the patient may need to come in for an appointment) / benefits (allows the patient to remain at home, decreasing exposure to coronavirus). I personally spent 15 minutes on medical discussion.

## 2021-02-20 NOTE — Telephone Encounter (Signed)
Return pt's call - states she's having leg weakness, "feels like I have no control of my body"; states this has happened before.  Denies facial drooping, changes in speech, pain. No open appts today; scheduled a telehealth appt with Dr Myrtie Hawk.

## 2021-02-23 ENCOUNTER — Telehealth: Payer: Medicare Other

## 2021-02-23 ENCOUNTER — Encounter: Payer: Self-pay | Admitting: Internal Medicine

## 2021-02-23 ENCOUNTER — Telehealth: Payer: Self-pay | Admitting: *Deleted

## 2021-02-23 NOTE — Telephone Encounter (Signed)
  Chronic Care Management   Outreach Note  02/23/2021 Name: Susan Holmes MRN: [id number] DOB: 11-11-1956  Referred by: [name], MD Reason for referral : Chronic Care Management (IDDM, HTN, HLD,  PVD, left knee and leg pain)   A second unsuccessful telephone outreach was attempted today. Called patient's mobile number x 2; unable to leave message as recording states "voice mail has not been activated." The patient was referred to the case management team for assistance with care management and care coordination.   Follow Up Plan: The care management team will reach out to the patient again over the next 7-14 days.   Kelli Churn RN, CCM, [facility] RN Care Manager [phone]

## 2021-02-24 NOTE — Progress Notes (Signed)
Internal Medicine Clinic Attending  Case discussed with Dr. Masoudi  At the time of the visit.  We reviewed the resident's history and exam and pertinent patient test results.  I agree with the assessment, diagnosis, and plan of care documented in the resident's note.  

## 2021-03-02 ENCOUNTER — Ambulatory Visit: Payer: Medicare Other | Admitting: *Deleted

## 2021-03-02 DIAGNOSIS — E1169 Type 2 diabetes mellitus with other specified complication: Secondary | ICD-10-CM

## 2021-03-02 DIAGNOSIS — I10 Essential (primary) hypertension: Secondary | ICD-10-CM

## 2021-03-02 DIAGNOSIS — E119 Type 2 diabetes mellitus without complications: Secondary | ICD-10-CM

## 2021-03-02 DIAGNOSIS — Z794 Long term (current) use of insulin: Secondary | ICD-10-CM

## 2021-03-02 DIAGNOSIS — R29898 Other symptoms and signs involving the musculoskeletal system: Secondary | ICD-10-CM

## 2021-03-02 NOTE — Chronic Care Management (AMB) (Signed)
  Chronic Care Management   Outreach Note  03/02/2021 Name: Susan Holmes MRN: [id number] DOB: 01/12/1956  Referred by: [name], MD Reason for referral : Chronic Care Management ( IDDM, HTN, HLD,  PVD, left knee and leg pain. )   Third unsuccessful telephone outreach was attempted today. The patient was referred to the case management team for assistance with care management and care coordination. The patient's primary care provider has been notified of our unsuccessful attempts to make or maintain contact with the patient. The care management team is pleased to engage with this patient at any time in the future should he/she be interested in assistance from the care management team.   Follow Up Plan: No further follow up required: clsoing referral as unable to maintain contact with patient.  Kelli Churn RN, CCM, [facility] RN Care Manager [phone]

## 2021-03-11 LAB — HM DIABETES EYE EXAM

## 2021-03-13 ENCOUNTER — Ambulatory Visit (INDEPENDENT_AMBULATORY_CARE_PROVIDER_SITE_OTHER): Payer: 59 | Admitting: Physician Assistant

## 2021-03-13 ENCOUNTER — Ambulatory Visit (INDEPENDENT_AMBULATORY_CARE_PROVIDER_SITE_OTHER)
Admission: RE | Admit: 2021-03-13 | Discharge: 2021-03-13 | Disposition: A | Discharge status: Home / Self Care | Payer: 59 | Source: Ambulatory Visit | Attending: Vascular Surgery | Admitting: Vascular Surgery

## 2021-03-13 ENCOUNTER — Other Ambulatory Visit: Payer: Self-pay

## 2021-03-13 ENCOUNTER — Ambulatory Visit (HOSPITAL_COMMUNITY)
Admission: RE | Admit: 2021-03-13 | Discharge: 2021-03-13 | Disposition: A | Discharge status: Home / Self Care | Payer: 59 | Source: Ambulatory Visit | Attending: Vascular Surgery | Admitting: Vascular Surgery

## 2021-03-13 VITALS — BP 127/87 | HR 85 | Temp 97.5°F | Resp 20 | Ht 67.0 in | Wt 165.8 lb

## 2021-03-13 DIAGNOSIS — M79605 Pain in left leg: Secondary | ICD-10-CM

## 2021-03-13 DIAGNOSIS — I739 Peripheral vascular disease, unspecified: Secondary | ICD-10-CM | POA: Diagnosis not present

## 2021-03-13 DIAGNOSIS — I779 Disorder of arteries and arterioles, unspecified: Secondary | ICD-10-CM | POA: Insufficient documentation

## 2021-03-13 DIAGNOSIS — F17200 Nicotine dependence, unspecified, uncomplicated: Secondary | ICD-10-CM | POA: Diagnosis not present

## 2021-03-13 NOTE — Progress Notes (Signed)
Established Previous Bypass   History of Present Illness   Susan Holmes is a 65 y.o. (03/11/1956) female who presents with chief complaint of numbness and swelling of her left lower extremity which has been going on since surgery.  She is status post left femoral to above-the-knee popliteal bypass with vein by Dr. Oneida Alar in October 2020.  Prior to that she underwent stenting of left external iliac artery in September 2020.  She states she has edema in her left lower extremity throughout the day.  She had a DVT study in August of last year that was negative.  She states the edema has not worsened since that exam.  She is also followed by Dr. Marlou Sa for lumbar spine disease.  She had an MRI of her lumbar spine in January of this year and recommendation was made for physical therapy.  Patient states she has not been to physical therapy at all.  She denies claudication, rest pain, or nonhealing wounds of bilateral lower extremities.  She is an everyday smoker.  The patient's PMH, PSH, SH, and FamHx were reviewed and are unchanged from prior visit.  Current Outpatient Medications  Medication Sig Dispense Refill  . allopurinol (ZYLOPRIM) 100 MG tablet Take 1 tablet (100 mg total) by mouth daily. 90 tablet 1  . amLODipine (NORVASC) 10 MG tablet Take 1 tablet (10 mg total) by mouth daily. 90 tablet 3  . aspirin EC 81 MG tablet Take 1 tablet (81 mg total) by mouth daily. 90 tablet 1  . Blood Glucose Monitoring Suppl (ACCU-CHEK GUIDE ME) w/Device KIT Check blood sugar 3 times a day 1 kit 0  . busPIRone (BUSPAR) 7.5 MG tablet Take 7.5 mg by mouth 2 (two) times daily.     . carvedilol (COREG) 12.5 MG tablet TAKE 1 TABLET(12.5 MG) BY MOUTH TWICE DAILY 60 tablet 6  . celecoxib (CELEBREX) 100 MG capsule Take 1 capsule (100 mg total) by mouth 2 (two) times daily. Take twice daily for two weeks then once daily as needed after that 60 capsule 0  . Continuous Blood Gluc Receiver (FREESTYLE LIBRE 2 READER) DEVI  Use to check blood sugar at least 6 times a day 1 each 0  . Continuous Blood Gluc Sensor (FREESTYLE LIBRE 2 SENSOR) MISC Use to check blood glucose at least 6 times a day 7 each 3  . diclofenac Sodium (VOLTAREN) 1 % GEL Apply 2 g topically daily as needed (pain).     . dorzolamide-timolol (COSOPT) 22.3-6.8 MG/ML ophthalmic solution Place 1 drop into both eyes every evening. 10 mL 1  . Dulaglutide (TRULICITY) 1.5 GU/5.4YH SOPN ADMINISTER 1.5 MG UNDER THE SKIN EVERY SATURDAY 6 mL 1  . DULoxetine (CYMBALTA) 60 MG capsule Take 1 capsule (60 mg total) by mouth daily. 30 capsule 0  . fluticasone (FLONASE) 50 MCG/ACT nasal spray Place 1 spray into both nostrils daily. (Patient taking differently: Place 1 spray into both nostrils daily as needed for allergies.) 18.2 mL 0  . gabapentin (NEURONTIN) 300 MG capsule Take 300 mg by mouth 3 (three) times daily.    Marland Kitchen glucose blood (ACCU-CHEK GUIDE) test strip Check blood sugar 3 times per day 100 each 12  . hydrALAZINE (APRESOLINE) 10 MG tablet TAKE 1 TABLET(10 MG) BY MOUTH IN THE MORNING AND AT BEDTIME 180 tablet 1  . hydrochlorothiazide (HYDRODIURIL) 25 MG tablet Take 1 tablet (25 mg total) by mouth daily. 90 tablet 3  . insulin glargine (LANTUS SOLOSTAR) 100 UNIT/ML Solostar Pen  Inject 45 Units into the skin at bedtime. 15 mL 1  . insulin lispro (HUMALOG KWIKPEN) 100 UNIT/ML KwikPen Inject 15 Units into the skin 3 (three) times daily. 15 mL 11  . Insulin Pen Needle 32G X 4 MM MISC Use to inject insulin 4 times a day 360 each 3  . Lancets (ACCU-CHEK SOFT TOUCH) lancets Check blood sugar 3 times a day 100 each 12  . lidocaine (XYLOCAINE) 5 % ointment Apply 1 application topically daily as needed for moderate pain.     Marland Kitchen LINZESS 145 MCG CAPS capsule Take 145 mcg by mouth daily.    Marland Kitchen losartan (COZAAR) 100 MG tablet Take 1 tablet (100 mg total) by mouth daily. 90 tablet 3  . metFORMIN (GLUCOPHAGE-XR) 500 MG 24 hr tablet Take 1 tablet (500 mg total) by mouth daily  with breakfast. 60 tablet 1  . mirtazapine (REMERON) 45 MG tablet Take 1 tablet (45 mg total) by mouth at bedtime. 30 tablet 1  . NARCAN 4 MG/0.1ML LIQD nasal spray kit Place 1 spray into the nose once.     Marland Kitchen OLANZapine (ZYPREXA) 2.5 MG tablet Take 2.5 mg by mouth at bedtime.    Marland Kitchen oxyCODONE (ROXICODONE) 15 MG immediate release tablet Take 15 mg by mouth every other day as needed for pain.     . pantoprazole (PROTONIX) 40 MG tablet Take 1 tablet by mouth daily.    . polyethylene glycol powder (GLYCOLAX/MIRALAX) 17 GM/SCOOP powder SMARTSIG:1 Scoopful By Mouth 4 Times Daily (Patient taking differently: Take 1 Container by mouth daily. SMARTSIG:1 Scoopful By Mouth 4 Times Daily) 255 g 1  . prednisoLONE acetate (PRED FORTE) 1 % ophthalmic suspension Place 1 drop into the right eye daily. 5 mL 1  . pregabalin (LYRICA) 150 MG capsule TAKE 1 CAPSULE BY MOUTH IN THE EVENING    . rosuvastatin (CRESTOR) 20 MG tablet TAKE 1 TABLET(20 MG) BY MOUTH AT BEDTIME 30 tablet 2  . tiZANidine (ZANAFLEX) 4 MG tablet TAKE 1 TABLET BY MOUTH THREE TIMES DAILY AS NEEDED. MAY BREAK IN HALF DURING DAY IF IT CAUSES YOU TO BE TOO SLEEPY.    . traZODone (DESYREL) 100 MG tablet Take 300 mg by mouth at bedtime.     No current facility-administered medications for this visit.    REVIEW OF SYSTEMS (negative unless checked):   Cardiac:  '[]'  Chest pain or chest pressure? '[]'  Shortness of breath upon activity? '[]'  Shortness of breath when lying flat? '[]'  Irregular heart rhythm?  Vascular:  '[]'  Pain in calf, thigh, or hip brought on by walking? '[]'  Pain in feet at night that wakes you up from your sleep? '[]'  Blood clot in your veins? '[]'  Leg swelling?  Pulmonary:  '[]'  Oxygen at home? '[]'  Productive cough? '[]'  Wheezing?  Neurologic:  '[]'  Sudden weakness in arms or legs? '[]'  Sudden numbness in arms or legs? '[]'  Sudden onset of difficult speaking or slurred speech? '[]'  Temporary loss of vision in one eye? '[]'  Problems with  dizziness?  Gastrointestinal:  '[]'  Blood in stool? '[]'  Vomited blood?  Genitourinary:  '[]'  Burning when urinating? '[]'  Blood in urine?  Psychiatric:  '[]'  Major depression  Hematologic:  '[]'  Bleeding problems? '[]'  Problems with blood clotting?  Dermatologic:  '[]'  Rashes or ulcers?  Constitutional:  '[]'  Fever or chills?  Ear/Nose/Throat:  '[]'  Change in hearing? '[]'  Nose bleeds? '[]'  Sore throat?  Musculoskeletal:  '[x]'  Back pain? '[]'  Joint pain? '[]'  Muscle pain?   Physical Examination   Vitals:  03/13/21 1004  BP: 127/87  Pulse: 85  Resp: 20  Temp: (!) 97.5 F (36.4 C)  TempSrc: Temporal  SpO2: 99%  Weight: 165 lb 12.8 oz (75.2 kg)  Height: '5\' 7"'  (1.702 m)   Body mass index is 25.97 kg/m.  General:  WDWN in NAD; vital signs documented above Gait: Not observed HENT: WNL, normocephalic Pulmonary: normal non-labored breathing , without Rales, rhonchi,  wheezing Cardiac: regular HR Abdomen: soft, NT, no masses Skin: without rashes Vascular Exam/Pulses:  Right Left  Radial 2+ (normal) 2+ (normal)  DP 2+ (normal) 2+ (normal)   Extremities: Unable to appreciate edema of left leg compared to right on physical exam; cystic structure left lateral superficial thigh, mobile Musculoskeletal: no muscle wasting or atrophy  Neurologic: A&O X 3;  No focal weakness or paresthesias are detected Psychiatric:  The pt has Normal affect.  Non-Invasive Vascular Imaging ABI  ABI/TBIToday's ABIToday's TBIPrevious ABIPrevious TBI  +-------+-----------+-----------+------------+------------+  Right 1.23    0.96    0.98    0.98      +-------+-----------+-----------+------------+------------+  Left  1.22    0.86    1.00    1.02   Left external iliac artery stent without hemodynamically significant stenosis  Bypass Duplex   Left lower extremity bypass graft patent without hemodynamically significant stenosis   Medical Decision Making    Susan Holmes is a 65 y.o. female who presents for evaluation of left lower extremity edema and numbness given history of femoral to above-the-knee popliteal bypass graft   Left foot is well-perfused with easily palpable DP pulse; duplex demonstrates a widely patent left external iliac artery stent and left femoral to above-the-knee popliteal bypass  Patient describes pain that radiates down her left leg into her knee especially at night; this is unlikely related to circulation  Agree with Dr. Randel Pigg recommendation for physical therapy  Continue aspirin and statin daily  Encouraged smoking cessation  Repeat bypass duplex and ABIs in 1 year   Dagoberto Ligas PA-C [facility] Office: [phone]  Clinic MD: Donzetta Matters

## 2021-03-19 ENCOUNTER — Telehealth: Payer: Self-pay | Admitting: Dietician

## 2021-03-19 NOTE — Telephone Encounter (Signed)
Susan Holmes called saying she was out of Continuous glucose monitoring supplies. The phone number for [employer] was given to her to call at her convenience.

## 2021-04-01 ENCOUNTER — Ambulatory Visit (INDEPENDENT_AMBULATORY_CARE_PROVIDER_SITE_OTHER): Payer: 59 | Admitting: Podiatry

## 2021-04-01 ENCOUNTER — Other Ambulatory Visit: Payer: Self-pay

## 2021-04-01 ENCOUNTER — Encounter: Payer: Self-pay | Admitting: Podiatry

## 2021-04-01 DIAGNOSIS — B351 Tinea unguium: Secondary | ICD-10-CM | POA: Diagnosis not present

## 2021-04-01 DIAGNOSIS — M79674 Pain in right toe(s): Secondary | ICD-10-CM

## 2021-04-01 DIAGNOSIS — E1151 Type 2 diabetes mellitus with diabetic peripheral angiopathy without gangrene: Secondary | ICD-10-CM | POA: Diagnosis not present

## 2021-04-01 DIAGNOSIS — L84 Corns and callosities: Secondary | ICD-10-CM

## 2021-04-01 DIAGNOSIS — M2042 Other hammer toe(s) (acquired), left foot: Secondary | ICD-10-CM

## 2021-04-01 DIAGNOSIS — M2041 Other hammer toe(s) (acquired), right foot: Secondary | ICD-10-CM

## 2021-04-01 DIAGNOSIS — M79675 Pain in left toe(s): Secondary | ICD-10-CM | POA: Diagnosis not present

## 2021-04-02 ENCOUNTER — Encounter: Payer: Medicare Other | Admitting: Internal Medicine

## 2021-04-05 NOTE — Progress Notes (Signed)
Subjective: Susan Holmes presents today at risk foot care. Pt has h/o NIDDM with PAD and painful callus(es) b/l and painful mycotic toenails b/l that are difficult to trim. Pain interferes with ambulation. Aggravating factors include wearing enclosed shoe gear. Pain is relieved with periodic professional debridement.   States blood glucose was 175 mg/dl this morning.  [name], MD is patient's PCP. Last visit was: 10/23/2020.  She voices no new pedal concerns on today's visit.  Allergies  Allergen Reactions  . Ace Inhibitors Cough  . Lipitor [Atorvastatin] Other (See Comments)    MYALGIA   . Chantix [Varenicline] Nausea And Vomiting    Objective: Susan Holmes is a pleasant 65 y.o. African American female WD, WN in NAD. AAO x 3.  There were no vitals filed for this visit.  Vascular Examination: Capillary refill time to digits immediate b/l. Palpable DP pulse(s) b/l lower extremities Nonpalpable PT pulse(s) b/l lower extremities. Pedal hair absent. Lower extremity skin temperature gradient within normal limits. No pain with calf compression b/l. No ischemia or gangrene noted b/l lower extremities.  Dermatological Examination: Pedal skin is thin shiny, atrophic b/l lower extremities. No open wounds bilaterally. No interdigital macerations bilaterally. Toenails 2-5 b/l elongated, discolored, dystrophic, thickened, crumbly with subungual debris and tenderness to dorsal palpation. Anonychia b/l great  toe(s) with evidence of permanent total nail avulsion. Nailbed(s) completely epithelialized and intact. Hyperkeratotic lesion(s) submet head 2 right foot, submet head 4 left foot, submet head 5 right foot and plantar aspect of b/l heels.  No erythema, no edema, no drainage, no flocculence.  Musculoskeletal: Normal muscle strength 5/5 to all lower extremity muscle groups bilaterally. No pain crepitus or joint limitation noted with ROM b/l. Hammertoes noted to the 2-5  bilaterally.  Neurological Examination: Protective sensation intact 5/5 intact bilaterally with 10g monofilament b/l. Vibratory sensation intact b/l. Proprioception intact bilaterally.  Last A1c: Hemoglobin A1C Latest Ref Rng & Units 11/24/2020 08/18/2020  HGBA1C 4.0 - 5.6 % >14.0(A) 6.7(A)  Some recent data might be hidden   Assessment: 1. Pain due to onychomycosis of toenails of both feet   2. Callus   3. Acquired hammertoes of both feet   4. Diabetes mellitus type 2 with peripheral artery disease ([place])    Plan: -Examined patient. -No new findings. No new orders. -Continue diabetic foot care principles. -Toenails 2-5 b/l were debrided in length and girth with sterile nail nippers and dremel without iatrogenic bleeding.  -Callus(es) submet head 2 right foot, submet head 4 left foot, submet head 5 right foot and plantar aspect of b/l heels  pared utilizing sterile scalpel blade without complication or incident. Total number debrided =4. -Patient to report any pedal injuries to medical professional immediately. -Patient to continue soft, supportive shoe gear daily. -Patient/POA to call should there be question/concern in the interim.  Return in about 3 months (around 07/01/2021) for diabetic nail trim.  [name], DPM

## 2021-04-15 ENCOUNTER — Other Ambulatory Visit: Payer: Self-pay | Admitting: Cardiovascular Disease

## 2021-06-05 DIAGNOSIS — M47816 Spondylosis without myelopathy or radiculopathy, lumbar region: Secondary | ICD-10-CM | POA: Insufficient documentation

## 2021-06-05 DIAGNOSIS — M5136 Other intervertebral disc degeneration, lumbar region: Secondary | ICD-10-CM | POA: Insufficient documentation

## 2021-06-05 HISTORY — DX: Spondylosis without myelopathy or radiculopathy, lumbar region: M47.816

## 2021-06-12 ENCOUNTER — Other Ambulatory Visit: Payer: Self-pay

## 2021-06-12 DIAGNOSIS — I10 Essential (primary) hypertension: Secondary | ICD-10-CM

## 2021-06-12 MED ORDER — CARVEDILOL 12.5 MG PO TABS: ORAL_TABLET | ORAL | 6 refills | Status: DC

## 2021-06-16 ENCOUNTER — Telehealth: Payer: Self-pay | Admitting: Dietician

## 2021-06-16 NOTE — Telephone Encounter (Signed)
Received this message in chat: pt is requesting a call back [phone] In regards to her sensors .. pt stated that the pharmacy tech told her to get in contact with you in order to get a new prescription.  Needs two more sensors before new sensors will be delivered. She had one sensor fell off, one needle was bent.  7/21 due to get more sensors from [facility], she says she called [facility] and they told her to ask her provider for a prescription. I told her I would call them also.  Per my call with [facility]: they need the patient to call them. Gave Susan Holmes te number and case number [id number] with instructions to call me with the outcome either way.

## 2021-06-18 ENCOUNTER — Other Ambulatory Visit: Payer: Self-pay | Admitting: Internal Medicine

## 2021-06-18 DIAGNOSIS — Z794 Long term (current) use of insulin: Secondary | ICD-10-CM

## 2021-06-18 DIAGNOSIS — E119 Type 2 diabetes mellitus without complications: Secondary | ICD-10-CM

## 2021-06-19 DIAGNOSIS — M47816 Spondylosis without myelopathy or radiculopathy, lumbar region: Secondary | ICD-10-CM | POA: Insufficient documentation

## 2021-06-22 ENCOUNTER — Other Ambulatory Visit: Payer: Self-pay | Admitting: Student

## 2021-06-22 DIAGNOSIS — Z794 Long term (current) use of insulin: Secondary | ICD-10-CM

## 2021-06-22 DIAGNOSIS — E119 Type 2 diabetes mellitus without complications: Secondary | ICD-10-CM

## 2021-06-22 MED ORDER — METFORMIN HCL ER 500 MG PO TB24: 500.0000 mg | ORAL_TABLET | Freq: Every day | ORAL | 0 refills | Status: DC

## 2021-06-23 NOTE — Telephone Encounter (Signed)
Attempted to contact patient for an appointment.  No answer and voicemail not setup.  Future appointment has been made for 07/10/2021 at 9:15 am and mailed to patient.

## 2021-07-06 ENCOUNTER — Other Ambulatory Visit: Payer: Self-pay

## 2021-07-10 ENCOUNTER — Other Ambulatory Visit: Payer: Self-pay | Admitting: *Deleted

## 2021-07-10 ENCOUNTER — Encounter: Payer: 59 | Admitting: Internal Medicine

## 2021-07-10 DIAGNOSIS — E119 Type 2 diabetes mellitus without complications: Secondary | ICD-10-CM

## 2021-07-10 MED ORDER — LANTUS SOLOSTAR 100 UNIT/ML ~~LOC~~ SOPN
45.0000 [IU] | PEN_INJECTOR | Freq: Every day | SUBCUTANEOUS | 1 refills | Status: DC
Start: 2021-07-10 — End: 2021-07-10

## 2021-07-10 MED ORDER — LANTUS SOLOSTAR 100 UNIT/ML ~~LOC~~ SOPN: 45.0000 [IU] | PEN_INJECTOR | Freq: Every day | SUBCUTANEOUS | 1 refills | Status: DC

## 2021-07-10 NOTE — Telephone Encounter (Signed)
Next appt scheduled 7/27 with Dr Jeanice Lim.

## 2021-07-10 NOTE — Addendum Note (Signed)
Addended by: Velora Heckler on: 07/10/2021 10:50 AM   Modules accepted: Orders

## 2021-07-10 NOTE — Telephone Encounter (Signed)
No Print option selected on Rx written today. Also, patient has Medicare. Rx will need to be resent by [facility] Team Resident or Attending.

## 2021-07-14 ENCOUNTER — Other Ambulatory Visit: Payer: Self-pay | Admitting: Student

## 2021-07-14 DIAGNOSIS — E119 Type 2 diabetes mellitus without complications: Secondary | ICD-10-CM

## 2021-07-14 DIAGNOSIS — E1165 Type 2 diabetes mellitus with hyperglycemia: Secondary | ICD-10-CM

## 2021-07-17 ENCOUNTER — Encounter: Payer: Self-pay | Admitting: Podiatry

## 2021-07-17 ENCOUNTER — Ambulatory Visit (INDEPENDENT_AMBULATORY_CARE_PROVIDER_SITE_OTHER): Payer: Medicare Other | Admitting: Podiatry

## 2021-07-17 ENCOUNTER — Other Ambulatory Visit: Payer: Self-pay

## 2021-07-17 DIAGNOSIS — M79674 Pain in right toe(s): Secondary | ICD-10-CM

## 2021-07-17 DIAGNOSIS — L84 Corns and callosities: Secondary | ICD-10-CM | POA: Diagnosis not present

## 2021-07-17 DIAGNOSIS — E1151 Type 2 diabetes mellitus with diabetic peripheral angiopathy without gangrene: Secondary | ICD-10-CM | POA: Diagnosis not present

## 2021-07-17 DIAGNOSIS — B351 Tinea unguium: Secondary | ICD-10-CM

## 2021-07-17 DIAGNOSIS — M79675 Pain in left toe(s): Secondary | ICD-10-CM

## 2021-07-19 NOTE — Progress Notes (Signed)
Subjective: Susan Holmes is a pleasant 65 y.o. female patient seen today for at risk foot care with h/o NIDDM and PAD. She is seen for calluses b/l feet and painful thick toenails that are difficult to trim. Pain interferes with ambulation. Aggravating factors include wearing enclosed shoe gear. Pain is relieved with periodic professional debridement.  Her blood glucose was 180 mg/dl today.  PCP is [name], MD.  Allergies  Allergen Reactions   Ace Inhibitors Cough   Lipitor [Atorvastatin] Other (See Comments)    MYALGIA    Chantix [Varenicline] Nausea And Vomiting    Objective: Physical Exam  General: Susan Holmes is a pleasant 65 y.o. African American female, in NAD. AAO x 3.   Vascular:  Capillary refill time to digits immediate b/l. Palpable DP pulse(s) b/l lower extremities Nonpalpable PT pulse(s) b/l lower extremities. Pedal hair absent. Lower extremity skin temperature gradient within normal limits. No pain with calf compression b/l. No ischemia or gangrene noted b/l lower extremities.  Dermatological:  Pedal skin is thin shiny, atrophic b/l lower extremities. Toenails 2-5 bilaterally elongated, discolored, dystrophic, thickened, and crumbly with subungual debris and tenderness to dorsal palpation. Anonychia noted L hallux and R hallux. Nailbed(s) epithelialized.  Hyperkeratotic lesion(s) submet head 2 right foot, submet head 4 left foot, submet head 5 right foot, and plantar aspect b/l heel pads.  No erythema, no edema, no drainage, no fluctuance.  Musculoskeletal:  Normal muscle strength 5/5 to all lower extremity muscle groups bilaterally. No pain crepitus or joint limitation noted with ROM b/l. Hammertoe(s) noted to the 2-5 bilaterally.  Neurological:  Protective sensation intact 5/5 intact bilaterally with 10g monofilament b/l. Vibratory sensation intact b/l.  Assessment and Plan:  1. Pain due to onychomycosis of toenails of both feet   2. Callus   3.  Diabetes mellitus type 2 with peripheral artery disease ([place])      -Examined patient. -Patient to continue soft, supportive shoe gear daily. -Toenails 2-5 bilaterally debrided in length and girth without iatrogenic bleeding with sterile nail nipper and dremel.  -Callus(es) submet head 2 right foot, submet head 4 left foot, submet head 5 right foot, and plantar aspect b/l heel pads pared utilizing sterile scalpel blade without complication or incident. Total number debrided =5. -Patient to report any pedal injuries to medical professional immediately. -Patient/POA to call should there be question/concern in the interim.  Return in about 3 months (around 10/17/2021).  [name], DPM

## 2021-07-21 ENCOUNTER — Other Ambulatory Visit: Payer: Self-pay | Admitting: Student

## 2021-07-21 DIAGNOSIS — E119 Type 2 diabetes mellitus without complications: Secondary | ICD-10-CM

## 2021-07-22 ENCOUNTER — Encounter: Payer: 59 | Admitting: Internal Medicine

## 2021-07-24 NOTE — Telephone Encounter (Signed)
Refilled

## 2021-08-28 ENCOUNTER — Other Ambulatory Visit: Payer: Self-pay | Admitting: *Deleted

## 2021-08-28 DIAGNOSIS — E119 Type 2 diabetes mellitus without complications: Secondary | ICD-10-CM

## 2021-08-28 DIAGNOSIS — I10 Essential (primary) hypertension: Secondary | ICD-10-CM

## 2021-08-29 MED ORDER — METFORMIN HCL ER 500 MG PO TB24: ORAL_TABLET | ORAL | 1 refills | Status: DC

## 2021-08-29 MED ORDER — AMLODIPINE BESYLATE 10 MG PO TABS: 10.0000 mg | ORAL_TABLET | Freq: Every day | ORAL | 3 refills | Status: DC

## 2021-08-29 MED ORDER — LANTUS SOLOSTAR 100 UNIT/ML ~~LOC~~ SOPN: 45.0000 [IU] | PEN_INJECTOR | Freq: Every day | SUBCUTANEOUS | 1 refills | Status: DC

## 2021-08-29 MED ORDER — LOSARTAN POTASSIUM 100 MG PO TABS: 100.0000 mg | ORAL_TABLET | Freq: Every day | ORAL | 3 refills | Status: DC

## 2021-09-14 ENCOUNTER — Other Ambulatory Visit: Payer: Self-pay

## 2021-09-14 DIAGNOSIS — E1165 Type 2 diabetes mellitus with hyperglycemia: Secondary | ICD-10-CM

## 2021-09-16 ENCOUNTER — Other Ambulatory Visit: Payer: Self-pay

## 2021-09-16 DIAGNOSIS — E1165 Type 2 diabetes mellitus with hyperglycemia: Secondary | ICD-10-CM

## 2021-09-18 MED ORDER — TRULICITY 1.5 MG/0.5ML ~~LOC~~ SOAJ: SUBCUTANEOUS | 1 refills | Status: DC

## 2021-09-23 ENCOUNTER — Ambulatory Visit: Payer: Medicare Other | Admitting: Dietician

## 2021-09-23 ENCOUNTER — Ambulatory Visit (INDEPENDENT_AMBULATORY_CARE_PROVIDER_SITE_OTHER): Payer: Medicare Other | Admitting: Internal Medicine

## 2021-09-23 ENCOUNTER — Other Ambulatory Visit: Payer: Self-pay

## 2021-09-23 ENCOUNTER — Encounter: Payer: Self-pay | Admitting: Dietician

## 2021-09-23 ENCOUNTER — Other Ambulatory Visit: Payer: Self-pay | Admitting: Dietician

## 2021-09-23 ENCOUNTER — Encounter: Payer: Self-pay | Admitting: Internal Medicine

## 2021-09-23 VITALS — BP 134/78 | HR 111 | Temp 98.3°F | Ht 67.0 in | Wt 166.0 lb

## 2021-09-23 DIAGNOSIS — E1169 Type 2 diabetes mellitus with other specified complication: Secondary | ICD-10-CM | POA: Diagnosis not present

## 2021-09-23 DIAGNOSIS — M542 Cervicalgia: Secondary | ICD-10-CM

## 2021-09-23 DIAGNOSIS — Z0000 Encounter for general adult medical examination without abnormal findings: Secondary | ICD-10-CM

## 2021-09-23 DIAGNOSIS — Z794 Long term (current) use of insulin: Secondary | ICD-10-CM | POA: Diagnosis not present

## 2021-09-23 DIAGNOSIS — Z79899 Other long term (current) drug therapy: Secondary | ICD-10-CM

## 2021-09-23 DIAGNOSIS — I10 Essential (primary) hypertension: Secondary | ICD-10-CM

## 2021-09-23 DIAGNOSIS — E785 Hyperlipidemia, unspecified: Secondary | ICD-10-CM

## 2021-09-23 DIAGNOSIS — E119 Type 2 diabetes mellitus without complications: Secondary | ICD-10-CM | POA: Diagnosis not present

## 2021-09-23 DIAGNOSIS — Z23 Encounter for immunization: Secondary | ICD-10-CM

## 2021-09-23 LAB — GLUCOSE, CAPILLARY: Glucose-Capillary: 442 mg/dL — ABNORMAL HIGH (ref 70–99)

## 2021-09-23 LAB — POCT GLYCOSYLATED HEMOGLOBIN (HGB A1C): Hemoglobin A1C: 12 % — AB (ref 4.0–5.6)

## 2021-09-23 MED ORDER — ONETOUCH DELICA LANCETS 33G MISC: 11 refills | Status: DC

## 2021-09-23 MED ORDER — ACCU-CHEK GUIDE VI STRP: ORAL_STRIP | 11 refills | Status: DC

## 2021-09-23 MED ORDER — DULOXETINE HCL 60 MG PO CPEP: 60.0000 mg | ORAL_CAPSULE | Freq: Every day | ORAL | 2 refills | Status: DC

## 2021-09-23 NOTE — Assessment & Plan Note (Signed)
Patient presents for evaluation of neck pain of 1 week duration.  She reports that initially she had a headache that she reports having traveled down into her neck.  She reports that she has not been able to turn her neck to the left side completely.  She has been trying a heating pad without any significant improvement.  She is also on tizanidine and reports that she has not had any significant relief with this. On examination, patient noted to have trapezius muscle tightness.  Strength 5/5 in bilateral upper extremities.  Sensation grossly intact to touch.  Suspect her symptoms are likely secondary to his muscle strain.  Plan Recommended for topical NSAID with heating pad Continue tizanidine Would hold off on further muscle relaxers at this time

## 2021-09-23 NOTE — Assessment & Plan Note (Signed)
Patient noted to have multiple centrally acting medications and similar mechanisms. Called patient to review current medications that she is taking. Med list updated. Referral to Dr Claiborne Billings for polypharmacy and assistance with medication organization and optimization of medication regimen

## 2021-09-23 NOTE — Assessment & Plan Note (Signed)
BP Readings from Last 3 Encounters:  09/23/21 134/78  03/13/21 127/87  01/06/21 122/68   Patient with a history of hypertension for which she was reportedly on amlodipine, HCTZ, coreg and valsartan. However, patient reports that she is only taking amlodipine 10mg  daily and losartan 100mg  daily.  Currently BP is at goal.  Plan: Continue amlodipine 10mg  daily Continue losartan 100mg  daily  F/u in 3 months

## 2021-09-23 NOTE — Telephone Encounter (Signed)
Patient request testing supplies for new meter and wants to use her old One touch Delica lancing device. Also request referral for continued diabetes eduction & support.

## 2021-09-23 NOTE — Patient Instructions (Addendum)
Ms Susan Holmes,  It was a pleasure seeing you in clinic. Today we discussed:    Diabetes: We are checking your A1c at this visit.  I will call you this afternoon to go over your medication list and ensure that you are taking the correct medication and we will talk further about any medication adjustments.  Neck pain: At this time, you may try Blue Emu or IcyHot or Bengay with ongoing heating pad to help with the muscle pain. Please let us know if your pain continues despite this.   If you have any questions or concerns, please call our clinic at [phone] between 9am-5pm and after hours call [phone] and ask for the internal medicine resident on call. If you feel you are having a medical emergency please call 911.   Thank you, we look forward to helping you remain healthy!

## 2021-09-23 NOTE — Progress Notes (Signed)
Diabetes Self-Management Education  Visit Type: 6 Month Follow-Up  Appt. Start Time: 1415 Appt. End Time: 1435  09/23/2021  Susan. Susan Holmes, identified by name and date of birth, is a 65 y.o. female with a diagnosis of Diabetes:  .   ASSESSMENT Susan Holmes does not want to continue with Continuous glucose monitoring; "more trouble than it is worth'. Suggest quarterly professional CGM instead.   Susan Holmes brought a One Touch ultra 2 meter she has been using with expired( 1 year expired) strips. A new meter was giving to patient today with 10 strips and lancets. She was shown how to use it and repeated demonstration without problem. Supplies to be ordered.  Lab Results  Component Value Date   HGBA1C 12.0 (A) 09/23/2021   HGBA1C >14.0 (A) 11/24/2020   HGBA1C 6.7 (A) 08/18/2020   HGBA1C 6.5 (A) 02/06/2020   HGBA1C 10.2 (A) 10/10/2019       Diabetes Self-Management Education - 09/23/21 1400       Visit Information   Visit Type 6 Month Follow-Up      Health Coping   How would you rate your overall health? Fair      Patient Education   Physical activity and exercise  Helped patient identify appropriate exercises in relation to his/her diabetes, diabetes complications and other health issue.    Acute complications Discussed and identified patients' treatment of hyperglycemia.;Taught treatment of hypoglycemia - the 15 rule.    Psychosocial adjustment Worked with patient to identify barriers to care and solutions;Identified and addressed patients feelings and concerns about diabetes      Patient Self-Evaluation of Goals - Patient rates self as meeting previously set goals (% of time)   Monitoring 50 - 75 %   stopped CGM and is using her meter now     Post-Education Assessment   Patient understands incorporating nutritional management into lifestyle. Demonstrates understanding / competency    Patient undertands incorporating physical activity into lifestyle. Demonstrates  understanding / competency    Patient understands using medications safely. Demonstrates understanding / competency    Patient understands monitoring blood glucose, interpreting and using results Demonstrates understanding / competency    Patient understands prevention, detection, and treatment of acute complications. Demonstrates understanding / competency    Patient understands how to develop strategies to address psychosocial issues. Needs Review   request referral to continue to assist Susan Holmes with diabetes   Patient understands how to develop strategies to promote health/change behavior. Needs Review      Outcomes   Expected Outcomes Demonstrated limited interest in learning.  Expect minimal changes    Future DMSE 4-6 wks    Program Status Completed      Subsequent Visit   Since your last visit have you continued or begun to take your medications as prescribed? Yes    Since your last visit have you had your blood pressure checked? No    Since your last visit have you experienced any weight changes? No change    Since your last visit, are you checking your blood glucose at least once a day? Yes             Individualized Plan for Diabetes Self-Management Training:   Learning Objective:  Patient will have a greater understanding of diabetes self-management. Patient education plan is to attend individual and/or group sessions per assessed needs and concerns.   Plan:   There are no Patient Instructions on file for this visit.  Expected Outcomes:  Demonstrated limited interest in learning.  Expect minimal changes  Education material provided: Diabetes Resources  If problems or questions, patient to contact team via:  Phone  Future DSME appointment: 4-6 wks, to 3 months, request supplies for new meter, request referral Debera Lat, [place] 09/23/2021 2:41 PM.

## 2021-09-23 NOTE — Assessment & Plan Note (Signed)
HbA1c 12.0 at this visit (previously >14). Patient is on MetforminXR 500mg  daily, Lantus 40U daily with Humalog 21V tid and Trulicity 1.5mg  weekly. She reports her blood sugars are usually in the 200's with the highest being 500. However, has been using expired testing strips.  Patient referred to clinical pharmacist for polypharmacy and medication organization. Once on confirmed stable regimen, will further adjust insulin dosing.

## 2021-09-24 ENCOUNTER — Telehealth: Payer: Self-pay

## 2021-09-24 DIAGNOSIS — M542 Cervicalgia: Secondary | ICD-10-CM

## 2021-09-24 LAB — LIPID PANEL
Chol/HDL Ratio: 2.2 ratio (ref 0.0–4.4)
Cholesterol, Total: 138 mg/dL (ref 100–199)
HDL: 64 mg/dL (ref >39)
LDL Chol Calc (NIH): 56 mg/dL (ref 0–99)
Triglycerides: 96 mg/dL (ref 0–149)
VLDL Cholesterol Cal: 18 mg/dL (ref 5–40)

## 2021-09-24 NOTE — Telephone Encounter (Signed)
Called patient to discuss ongoing neck pain. She reports that it is unchanged from yesterday despite tizanidine 4mg  twice daily, topical pain medication and heating pad.  Advised patient that she can take the tizanidine up to three times daily. Also recommended for physical therapy for which she is agreeable. Referral to physical therapy placed.

## 2021-09-24 NOTE — Telephone Encounter (Signed)
Requesting to speak with a nurse about getting medicine for neck pain. Please call pt back.

## 2021-09-24 NOTE — Telephone Encounter (Signed)
Returned call to patient. States she has used the Voltaren gel, the tizanidine, and the heating pad without relief. She wants to know what else to take, or do for this continued pain. Please advise.

## 2021-09-29 ENCOUNTER — Telehealth: Payer: Self-pay | Admitting: Dietician

## 2021-09-29 NOTE — Telephone Encounter (Signed)
[facility] called to follow up on Susan Holmes's CGM order. They were informed she no longer wants to use CGM.

## 2021-10-01 ENCOUNTER — Encounter: Payer: Self-pay | Admitting: *Deleted

## 2021-10-01 NOTE — Progress Notes (Unsigned)

## 2021-10-05 ENCOUNTER — Telehealth: Payer: Self-pay | Admitting: Internal Medicine

## 2021-10-05 NOTE — Telephone Encounter (Signed)
Things That May Be Affecting Your Health:  Alcohol  Hearing loss  Pain   Y Depression  Home Safety  Sexual Health  Y Diabetes  Lack of physical activity  Stress   Difficulty with daily activities  Loneliness Y Tiredness   Drug use  Medicines Y Tobacco use   Falls  Motor Vehicle Safety  Weight  Y Food choices  Oral Health  Other    YOUR PERSONALIZED HEALTH PLAN : 1. Schedule your next subsequent Medicare Wellness visit in one year 2. Attend all of your regular appointments to address your medical issues 3. Complete the preventative screenings and services   Annual Wellness Visit   Medicare Covered Preventative Screenings and [facility] Men and Women Who How Often Need? Date of Last Service Action  Abdominal Aortic Aneurysm Adults with AAA risk factors Once  @[email or web address] ([id number])@    Alcohol Misuse and Counseling All Adults Screening once a year if no alcohol misuse. Counseling up to 4 face to face sessions.     Bone Density Measurement  Adults at risk for osteoporosis Once every 2 yrs Y @[facility] ([id number]    Lipid Panel Z13.6 All adults without CV disease Once every 5 yrs  @[facility] ([id number])@     Colorectal Cancer  Stool sample or Colonoscopy All adults 12 and older  Once every year Every 10 years  @[facility] ([id number])@ @[facility] ([id number])@ @[facility] ([id number])@    Depression All Adults Once a year Y Today   Diabetes Screening Blood glucose, post glucose load, or GTT Z13.1 All adults at risk Pre-diabetics Once per year Twice per year  @[facility] ([id number])@    Diabetes  Self-Management Training All adults Diabetics 10 hrs first year; 2 hours subsequent years. Requires Copay Y    Glaucoma Diabetics Family history of glaucoma African Americans 21 yrs + Hispanic Americans 59 yrs + Annually - requires coppay  @[facility] ([phone])@    Hepatitis C Z72.89 or F19.20 High Risk for HCV Born  between 1945 and 1965 Annually Once      HIV Z11.4 All adults based on risk Annually btw ages 84 & 22 regardless of risk Annually > 65 yrs if at increased risk      Lung Cancer Screening Asymptomatic adults aged 42-77 with 30 pack yr history and current smoker OR quit within the last 15 yrs Annually Must have counseling and shared decision making documentation before first screen Y     Medical Nutrition Therapy Adults with  Diabetes Renal disease Kidney transplant within past 3 yrs 3 hours first year; 2 hours subsequent years     Obesity and Counseling All adults Screening once a year Counseling if BMI 30 or higher  Today   Tobacco Use Counseling Adults who use tobacco  Up to 8 visits in one year     Vaccines Z23 Hepatitis B Influenza  Pneumonia  Adults  Once Once every flu season Two different vaccines separated by one year     Next Annual Wellness Visit People with Medicare Every year  Today     Services & Screenings Women Who How Often Need  Date of Last Service Action  Mammogram  Z12.31 Women over 53 One baseline ages 9-39. Annually ager 55 yrs+ Y     Pap tests All women Annually if high risk. Every 2 yrs for normal risk women      Screening for cervical cancer with  Pap (Z01.419 nl or Z01.411abnl) & HPV Z11.51 Women aged 26 to 71 Once every 5 yrs  Screening pelvic and breast exams All women Annually if high risk. Every 2 yrs for normal risk women     Sexually Transmitted Diseases Chlamydia Gonorrhea Syphilis All at risk adults Annually for non pregnant females at increased risk         [place] Men Who How Ofter Need  Date of Last Service Action  Prostate Cancer - DRE & PSA Men over 50 Annually.  DRE might require a copay.        Sexually Transmitted Diseases Syphilis All at risk adults Annually for men at increased risk      Health Maintenance List Health Maintenance  Topic Date Due   Zoster Vaccines- Shingrix (1 of 2) Never done    MAMMOGRAM  08/01/2020   COVID-19 Vaccine (2 - Moderna risk series) 11/22/2020   HEMOGLOBIN A1C  12/23/2021   OPHTHALMOLOGY EXAM  05/22/2022   FOOT EXAM  09/23/2022   LIPID PANEL  09/23/2022   COLONOSCOPY (Pts 45-63yrs Insurance coverage will need to be confirmed)  04/06/2027   TETANUS/TDAP  06/13/2028   INFLUENZA VACCINE  Completed   DEXA SCAN  Completed   Hepatitis C Screening  Completed   HPV VACCINES  Aged Out

## 2021-10-06 ENCOUNTER — Ambulatory Visit (INDEPENDENT_AMBULATORY_CARE_PROVIDER_SITE_OTHER): Payer: Medicare Other | Admitting: Pharmacist

## 2021-10-06 DIAGNOSIS — Z79899 Other long term (current) drug therapy: Secondary | ICD-10-CM | POA: Diagnosis not present

## 2021-10-06 NOTE — Progress Notes (Signed)
   Subjective:    Patient ID: Susan Holmes, female    DOB: 11/30/1956, 65 y.o.   MRN: [id number]  HPI Patient is a 65 y.o. female who presents for medication review and management. She is in good spirits and presents without assistance. Patient was referred and last seen by Primary Care Provider on 09/23/21.  Medication Adherence Questionnaire (A score of 2 or more points indicates risk for nonadherence)  Do you know what each of your medicines is for? 1 (1 point if no)  Do you ever have trouble remembering to take your medicine? 0 (2 points if yes)  Do you ever not take a medicine because you feel you do not need it?  0 (1 point if yes)  Do you think that any of your medicines is not helping you? 1: "It seems like too much and trazodone does not help me fall asleep until 2 hours later" (1 point if yes)  Do you have any physical problems such as vision loss that keep you from taking your medicines as prescribed?  0 (2 points if yes)  Do you think any of your medicine is causing a side effect? 0 (1 point if yes)  Do you know the names of ALL of your medicines? 1 (1 point if no)  Do you think that you need ALL of your medicines? 1 (1 point if no)  In the past 6 months, have you missed getting a refill or a new prescription filled on time? 0 (1 point if yes)  How often do you miss taking a dose of medicine? 0 Never (0 points), 1 or 2 times a month (1 points), 1 time a week (2 points), 2 or more times a week (3 points).   TOTAL SCORE 4/14    Objective:   Labs:   Physical Exam Neurological:     Mental Status: She is alert and oriented to person, place, and time.    Assessment/Plan:   Understanding of regimen: fair  Understanding of indications: fair  Potential of compliance: good  Patient has known adherence challenges based on score of 4 for questionnaire. Barriers include: lack of knowledge and concern for more harm than good. Discussed with patient which medications are  maintenance medications helping control her chronic disease states and which are as needed, to ease concerns surrounding taking too many medications. Patient appreciative of review.  Medication list reviewed and updated. Patient was provided with a printed medication list.   Follow-up appointment 10/19/21 for further diabetes management. Written patient instructions provided.  This appointment required 30 minutes of direct patient care.  Thank you for involving pharmacy to assist in providing this patient's care.

## 2021-10-06 NOTE — Patient Instructions (Signed)
Miss Garno it was a pleasure seeing you today.   Today we reviewed all of the medications you are currently taking. Included is an updated medication list. Please continue taking all medications as prescribed on this list.  Continue to use your pill organizer. If you decide you would like to use a pharmacy to have your medications packaged for you please let us know.  If you have any questions please call the clinic and ask to speak with me.  Follow-up with me on October 24th at 9:30 AM and then you will see [name] Plyler at 10:15

## 2021-10-12 NOTE — Progress Notes (Signed)
Internal Medicine Clinic Attending ° °Case discussed with Dr. Aslam  At the time of the visit.  We reviewed the resident’s history and exam and pertinent patient test results.  I agree with the assessment, diagnosis, and plan of care documented in the resident’s note.  °

## 2021-10-13 ENCOUNTER — Encounter: Payer: Self-pay | Admitting: Physical Therapy

## 2021-10-13 ENCOUNTER — Other Ambulatory Visit: Payer: Self-pay

## 2021-10-13 ENCOUNTER — Ambulatory Visit: Payer: Medicare Other | Attending: Internal Medicine | Admitting: Physical Therapy

## 2021-10-13 DIAGNOSIS — M542 Cervicalgia: Secondary | ICD-10-CM | POA: Diagnosis not present

## 2021-10-13 DIAGNOSIS — R252 Cramp and spasm: Secondary | ICD-10-CM | POA: Diagnosis present

## 2021-10-13 NOTE — Therapy (Signed)
[place] [place], [place], [address] Phone: [phone]   Fax:  [phone]  Physical Therapy Evaluation  Patient Details  Name: Susan Holmes MRN: [id number] Date of Birth: 04-08-1956 Referring Provider (PT): [name], MD   Encounter Date: 10/13/2021   PT End of Session - 10/13/21 0932     Visit Number 1    Date for PT Re-Evaluation 11/24/21    Authorization Type UHC MCR    Progress Note Due on Visit 10    PT Start Time 0933    PT Stop Time 1517    PT Time Calculation (min) 41 min    Activity Tolerance Patient tolerated treatment well    Behavior During Therapy [facility] for tasks assessed/performed             Past Medical History:  Diagnosis Date   Anxiety    Arthritis    Callus of foot 09/19/2018   Chest pain 09/26/2018   Claudication ([place]) 09/14/2019   Congenital blindness    Left  eye   Coronary artery disease    per cardiac cath 2015-- mild disease involving LAD and branches ,  LCFx and branches, and mild disease pRCA   Cough 07/08/2016   Depression    Diabetes mellitus, type II ([place])    followed by pcp   Glaucoma    Glaucoma, both eyes    Hepatitis B infection pt unsure   resolved   History of kidney stones    right stone   History of transient ischemic attack (TIA) 2017   per pt no residuals   History of vitamin D deficiency 09/06/2017   HTN (hypertension)    followed by pcp   Hyperlipidemia    PAD (peripheral artery disease) ([place])    Paronychia of great toe, left 06/21/2019   Poor dental hygiene    Stroke ([place])    Tobacco abuse    .5 PPD smoker   Uncontrolled type II diabetes mellitus 06/12/2007   Qualifier: Diagnosis of  By: [place] MSN, FNP-C, Collie Siad      Past Surgical History:  Procedure Laterality Date   ABDOMINAL AORTOGRAM W/LOWER EXTREMITY N/A 09/14/2019   Procedure: ABDOMINAL AORTOGRAM W/LOWER EXTREMITY;  Surgeon: Elam Dutch, MD;  Location: [facility];   Service: Cardiovascular;  Laterality: N/A;   ABDOMINAL HYSTERECTOMY  1980s   unsure if ovaries were taken   BYPASS GRAFT Left 10/22/2019    Left femoral to above-knee popliteal bypass using reversed ipsilateral greater saphenous vein   colonoscopy     FEMORAL-POPLITEAL BYPASS GRAFT Left 10/22/2019   Procedure: LEFT FEMORAL-ABOVE KNEE POPLITEAL  BYPASS GRAFT with REVERSED GREATER SAPHENOUS VEIN;  Surgeon: Elam Dutch, MD;  Location: [facility];  Service: Vascular;  Laterality: Left;   GLAUCOMA SURGERY Right 2017   IR URETERAL STENT RIGHT NEW ACCESS W/O SEP NEPHROSTOMY CATH  10/03/2018   LEFT HEART CATHETERIZATION WITH CORONARY ANGIOGRAM N/A 10/09/2014   Procedure: LEFT HEART CATHETERIZATION WITH CORONARY ANGIOGRAM;  Surgeon: Troy Sine, MD;  Location: [facility] CATH LAB;  Service: Cardiovascular;  Laterality: N/A;   NEPHROLITHOTOMY Right 10/03/2018   Procedure: RIGHT NEPHROLITHOTOMY PERCUTANEOUS FIRST STAGE;  Surgeon: Irine Seal, MD;  Location: [facility];  Service: Urology;  Laterality: Right;   PERIPHERAL VASCULAR INTERVENTION Left 09/14/2019   Procedure: PERIPHERAL VASCULAR INTERVENTION;  Surgeon: Elam Dutch, MD;  Location: [facility];  Service: Cardiovascular;  Laterality: Left;   left external iliac  stent    TEE WITHOUT CARDIOVERSION N/A 08/17/2016   Procedure: TRANSESOPHAGEAL ECHOCARDIOGRAM (TEE);  Surgeon: Sueanne Margarita, MD;  Location: [facility] ENDOSCOPY;  Service: Cardiovascular;  Laterality: N/A;    There were no vitals filed for this visit.    Subjective Assessment - 10/13/21 0935     Subjective About 2-3 weeks ago her bil neck started hurting when she awoke and then right side felt like a toothache and she can't lie on it. She is able to turn her head more now but the right side still hurts.    Pertinent History DM, HTN, TIA    Patient Stated Goals get rid of pain    Currently in Pain? Yes    Pain Score 10-Worst pain ever    Pain Location Neck    Pain Orientation Right     Pain Descriptors / Indicators Aching    Pain Type Acute pain    Pain Onset 1 to 4 weeks ago    Pain Frequency Constant    Aggravating Factors  lying on it    Pain Relieving Factors heat                [facility] PT Assessment - 10/13/21 0001       Assessment   Medical Diagnosis neck pain    Referring Provider (PT) [name], MD    Onset Date/Surgical Date 09/22/21    Hand Dominance Right    Prior Therapy yes      Precautions   Precautions None      Restrictions   Weight Bearing Restrictions No      Balance Screen   Has the patient fallen in the past 6 months No    Has the patient had a decrease in activity level because of a fear of falling?  No    Is the patient reluctant to leave their home because of a fear of falling?  No      Home Ecologist residence      Prior Function   Level of Independence Independent    Vocation On disability      Observation/Other Assessments   Focus on Therapeutic Outcomes (FOTO)  FS = 53 (goal 66)      Posture/Postural Control   Posture/Postural Control Postural limitations    Postural Limitations Rounded Shoulders;Forward head;Increased thoracic kyphosis      ROM / Strength   AROM / PROM / Strength AROM;Strength;PROM      AROM   AROM Assessment Site Cervical    Cervical Extension 15    Cervical - Right Side Bend 8    Cervical - Left Side Bend 14    Cervical - Right Rotation 50    Cervical - Left Rotation 29      PROM   Overall PROM Comments right rot 75%; left rot 50% (supine)      Strength   Overall Strength Comments 4-/5 cervical with pain; left SB 3+/5      Palpation   Spinal mobility Right cervical PA stiff and painful; decreased cervical lateral glide right side with pain in upper cervical; difficult to assess rotational mobs due to pain; decreased Thoracic spine mobility with PA mobs and pain.    Palpation comment bil UT cervical paraspinals subocciptials                         Objective measurements completed on examination: See above findings.  PT Education - 10/13/21 1025     Education Details HEP: DN education    Person(s) Educated Patient    Methods Explanation;Demonstration;Handout;Verbal cues    Comprehension Verbalized understanding;Returned demonstration              PT Short Term Goals - 10/13/21 1026       PT SHORT TERM GOAL #1   Title Pt will be independent with HEP     Time 2    Period Weeks    Status New      PT SHORT TERM GOAL #2   Title -               PT Long Term Goals - 10/13/21 1026       PT LONG TERM GOAL #1   Title Ind with advanced HEP and its progression    Time 6    Period Weeks    Status New    Target Date 11/24/21      PT LONG TERM GOAL #2   Title Pt able to sleep without neck pain    Time 6    Period Weeks    Status New      PT LONG TERM GOAL #3   Title improved cervical ROM to [facility] with >80% improvement in pain    Time 6    Period Weeks    Status New      PT LONG TERM GOAL #4   Title Improved FS score to >= 66    Baseline FS = 53    Time 6    Period Weeks    Status New                    Plan - 10/13/21 1014     Clinical Impression Statement Patient presents with right neck pain x 2-3 weeks. She awoke with pain. She has decreased active and passive ROM and decreased strength affecting ADLs including her ability to sleep. She has decreased spinal mobility in the cervical and thoracic spines and increased tone and spasm in the upper traps and suboccipitals. She has poor posture with forward head, rounded shoulders and increased thoracic kyphosis. She will benefit from skilled PT to address these deficits and return her to painfree PLOF.    Personal Factors and Comorbidities Comorbidity 3+    Comorbidities DM, HTN, TIA    Examination-Activity Limitations Sleep;Lift    Stability/Clinical Decision Making Stable/Uncomplicated     Clinical Decision Making Low    Rehab Potential Excellent    PT Frequency 2x / week    PT Duration 6 weeks    PT Treatment/Interventions ADLs/Self Care Home Management;Cryotherapy;Electrical Stimulation;Moist Heat;Traction;Neuromuscular re-education;Therapeutic exercise;Therapeutic activities;Patient/family education;Manual techniques;Dry needling;Spinal Manipulations    PT Next Visit Plan Review HEP, DN/manual to UT, cspine; get neck moving, thoracic extension and mobiity    PT Home Exercise Plan [email or web address]  URL: [email or web address]  Date: 10/13/2021  Prepared by: Almyra Free     Exercises  Seated Cervical Rotation AROM - 3 x daily - 7 x weekly - 1 sets - 5-10 reps - 5 sec hold  Seated Cervical Sidebending AROM - 3 x daily - 7 x weekly - 1 sets - 5-10 reps - 5 sec hold  Seated Cervical Retraction - 3 x daily - 7 x weekly - 1 sets - 10 reps - 3-5 sec hold  Doorway Pec Stretch at 90 Degrees Abduction - 2 x daily - 7 x weekly - 1 sets - 3 reps -  30-60 seconds hold    Consulted and Agree with Plan of Care Patient             Patient will benefit from skilled therapeutic intervention in order to improve the following deficits and impairments:  Decreased range of motion, Increased muscle spasms, Pain, Hypomobility, Impaired flexibility, Postural dysfunction, Decreased strength  Visit Diagnosis: Cervicalgia  Cramp and spasm     Problem List Patient Active Problem List   Diagnosis Date Noted   Lumbar spondylosis 06/19/2021   Degeneration of lumbar intervertebral disc 06/05/2021   Transient leg weakness 02/20/2021   Delayed gastric emptying 12/31/2020   Erosive gastropathy 12/31/2020   Fall 11/24/2020   Hearing loss 10/23/2020   Polypharmacy 09/09/2020   Neck pain 12/17/2019   PVD (peripheral vascular disease) ([place]) 10/04/2019   Insulin dependent type 2 diabetes mellitus ([place]) 10/04/2019   Reactive airway disease 06/20/2018   Unintentional weight loss 06/14/2018   History of  CVA (cerebrovascular accident) 07/23/2016   Osteoarthritis 12/11/2014   History of chest pain 10/07/2014   Pre-operative cardiovascular examination 10/07/2014   Liver hemangioma 04/04/2014   RBC microcytosis 02/05/2014   Hyperparathyroidism ([place]) 03/08/2013   Calcific tendinitis of right shoulder 02/26/2012   Hyperlipidemia associated with type 2 diabetes mellitus ([place]) 06/12/2007   TOBACCO ABUSE 06/12/2007   Depression 06/12/2007   CONGENITAL BLINDNESS, LEFT EYE 06/12/2007   Essential hypertension 06/12/2007    Madelyn Flavors, PT 10/13/2021, 10:37 AM  [address] [place], [place], [address] Phone: [phone]   Fax:  [phone]  Name: Susan Holmes MRN: [id number] Date of Birth: 11/22/1956

## 2021-10-13 NOTE — Patient Instructions (Signed)
Access Code: [email or web address] URL: [email or web address] Date: 10/13/2021 Prepared by: Almyra Free  Exercises Seated Cervical Rotation AROM - 3 x daily - 7 x weekly - 1 sets - 5-10 reps - 5 sec hold Seated Cervical Sidebending AROM - 3 x daily - 7 x weekly - 1 sets - 5-10 reps - 5 sec hold Seated Cervical Retraction - 3 x daily - 7 x weekly - 1 sets - 10 reps - 3-5 sec hold Doorway Pec Stretch at 90 Degrees Abduction - 2 x daily - 7 x weekly - 1 sets - 3 reps - 30-60 seconds hold  Patient Education Trigger Point Dry Needling

## 2021-10-15 ENCOUNTER — Ambulatory Visit: Payer: Medicare Other | Admitting: Pharmacist

## 2021-10-19 ENCOUNTER — Ambulatory Visit (INDEPENDENT_AMBULATORY_CARE_PROVIDER_SITE_OTHER): Payer: Medicare Other | Admitting: Dietician

## 2021-10-19 ENCOUNTER — Encounter: Payer: Self-pay | Admitting: Dietician

## 2021-10-19 ENCOUNTER — Ambulatory Visit (INDEPENDENT_AMBULATORY_CARE_PROVIDER_SITE_OTHER): Payer: Medicare Other | Admitting: Pharmacist

## 2021-10-19 DIAGNOSIS — E119 Type 2 diabetes mellitus without complications: Secondary | ICD-10-CM

## 2021-10-19 DIAGNOSIS — Z713 Dietary counseling and surveillance: Secondary | ICD-10-CM | POA: Diagnosis not present

## 2021-10-19 DIAGNOSIS — Z794 Long term (current) use of insulin: Secondary | ICD-10-CM

## 2021-10-19 DIAGNOSIS — I10 Essential (primary) hypertension: Secondary | ICD-10-CM | POA: Diagnosis not present

## 2021-10-19 MED ORDER — METFORMIN HCL ER 500 MG PO TB24: 500.0000 mg | ORAL_TABLET | Freq: Two times a day (BID) | ORAL | 2 refills | Status: DC

## 2021-10-19 MED ORDER — ONETOUCH VERIO W/DEVICE KIT: PACK | 0 refills | Status: DC

## 2021-10-19 MED ORDER — ONETOUCH VERIO VI STRP: ORAL_STRIP | 12 refills | Status: DC

## 2021-10-19 MED ORDER — TRULICITY 3 MG/0.5ML ~~LOC~~ SOAJ: 3.0000 mg | SUBCUTANEOUS | 0 refills | Status: DC

## 2021-10-19 NOTE — Assessment & Plan Note (Signed)
Hypertension longstanding currently controlled.  Blood pressure goal = <130/80 mmHg.   1. Continue amlodipine 10mg  and losartan 100mg  2. Extensively discussed pathophysiology of blood pressure, dietary effects on blood pressure control, and recommended lifestyle interventions

## 2021-10-19 NOTE — Assessment & Plan Note (Signed)
T2DM is not controlled likely due to sub-optimal lifestyle and potential non-compliance. Additional pharmacotherapy is not warranted at this time as we will titrate and adjust patient's current medications. Will increase patient's Trulicity from 1.5mg  once weekly to 3mg  once weekly. Will increase patient's metformin from 500mg  once daily to 500mg  BID. In past patient reported tolerability issues (diarrhea) to increased doses of metformin but cannot recall and is willing to trial two pills a day. Will have Debera Lat, RD discuss adjustments to insulin with patient based on her dietary choices discussed during visit. Following instruction patient verbalized understanding of treatment plan.    1. Continued basal insulin Lantus 40 units once daily and discuss adjustments with Debera Lat, RD after this visit  2. Continued  rapid insulin Humalog 10 untits TID with meals and discuss adjustments with Debera Lat, RD after this visit. 3. Increased dose of GLP-1 Trulicity to 3mg  once weekly. 4. Extensively discussed pathophysiology of diabetes, dietary effects on blood sugar control, and recommended lifestyle interventions. 5. Counseled on s/sx of and management of hypoglycemia 6. Next A1C anticipated December 2022.

## 2021-10-19 NOTE — Progress Notes (Signed)
Subjective:    Patient ID: Susan Holmes, female    DOB: 05/22/1956, 65 y.o.   MRN: [id number]  HPI Patient is a 65 y.o. female who presents for diabetes management. She is in good spirits and presents without assistance. Patient was referred and last seen by Primary Care Provider on 09/23/21.  Patient reports diabetes was diagnosed around 25 years ago.   Insurance coverage/medication affordability: UHC Medicare  Current diabetes medications include: Lantus 40 units once daily in PM, Humalog 10 units TID with meals, metformin 500mg  XR, Trulicity 1.5mg  once weekly on Saturday Current hypertension medications include: amlodipine 10mg , losartan 100mg  Current hyperlipidemia medications include: rosuvastatin 20mg  Patient states that She is taking her medications as prescribed. Patient reports adherence with medications, however, states "I miss my Humalog with my meals every so often."  Do you feel that your medications are working for you?  "I don't know, I know the sleep is not working for me"  Have you been experiencing any side effects to the medications prescribed? no  Do you have any problems obtaining medications due to transportation or finances?  no     Patient reported dietary habits:  Patient has visit today with Debera Lat, RD - will discuss diet  Patient reports hypoglycemic events. Patient reports polyuria (increased urination).  Patient denies polyphagia (increased appetite).  Patient reports polydipsia (increased thirst).  Patient reports neuropathy (nerve pain) in occasion in her fingers Patient denies visual changes. Patient reports self foot exams.   Home fasting blood sugars: not checking  2 hour post-meal/random blood sugars: 566, "HI", 453. "HI", 498, 360, 312, 457, 388, 444, 350, 398  Objective:   Labs:   Physical Exam Neurological:     Mental Status: She is alert and oriented to person, place, and time.    Review of Systems  Gastrointestinal:   Negative for abdominal pain, diarrhea, nausea and vomiting.   Lab Results  Component Value Date   HGBA1C 12.0 (A) 09/23/2021   HGBA1C >14.0 (A) 11/24/2020   HGBA1C 6.7 (A) 08/18/2020    Vitals:   10/19/21 0959  BP: 125/77  Pulse: 89    Lab Results  Component Value Date   MICRALBCREAT <8 01/02/2021    Lipid Panel     Component Value Date/Time   CHOL 138 09/23/2021 1411   TRIG 96 09/23/2021 1411   HDL 64 09/23/2021 1411   CHOLHDL 2.2 09/23/2021 1411   CHOLHDL 3.4 04/26/2017 1403   VLDL 31 (H) 04/26/2017 1403   LDLCALC 56 09/23/2021 1411   LDLDIRECT 183 (H) 03/01/2014 0500    Assessment/Plan:  T2DM is not controlled likely due to sub-optimal lifestyle and potential non-compliance. Additional pharmacotherapy is not warranted at this time as we will titrate and adjust patient's current medications. Will increase patient's Trulicity from 1.5mg  once weekly to 3mg  once weekly. Will increase patient's metformin from 500mg  once daily to 500mg  BID. In past patient reported tolerability issues (diarrhea) to increased doses of metformin but cannot recall and is willing to trial two pills a day. Will have Debera Lat, RD discuss adjustments to insulin with patient based on her dietary choices discussed during visit. Following instruction patient verbalized understanding of treatment plan.    Continued basal insulin Lantus 40 units once daily and discuss adjustments with Debera Lat, RD after this visit  Continued  rapid insulin Humalog 10 untits TID with meals and discuss adjustments with Debera Lat, RD after this visit. Increased dose of GLP-1 Trulicity to 3mg  once  weekly. Extensively discussed pathophysiology of diabetes, dietary effects on blood sugar control, and recommended lifestyle interventions. Counseled on s/sx of and management of hypoglycemia Next A1C anticipated December 2022.   Hypertension longstanding currently controlled.  Blood pressure goal = <130/80 mmHg.    Continue amlodipine 10mg  and losartan 100mg  Extensively discussed pathophysiology of blood pressure, dietary effects on blood pressure control, and recommended lifestyle interventions  Follow-up appointment 4-6 weeks to review sugar readings. Written patient instructions provided.  This appointment required 30 minutes of direct patient care.  Thank you for involving pharmacy to assist in providing this patient's care.

## 2021-10-19 NOTE — Progress Notes (Signed)
Medical Nutrition Therapy:  Appt start time: 7628 end time:  1055. Total time: 40 Visit # 1  Assessment:  Primary concerns today: meal planning for glycemic control  Ms. Subramaniam says the biggest problem for her in caring for her diabetes is " craving sweets" because she knows she is not supposed to have them". She has had Diabetes Self Management Education & Support And Medical Nutrition Therapy in the past. Unsure of her retention and suspect some competing values.  She would like her diabetes to be better controlled. She met with our pharmacist this morning and states the medicine changes they discussed. She goes to mental health an was asking about when her appointments with them are.   Preferred Learning Style: she did not want written information.mostly Auditory, some Visual of diet recall in board with proposed insulin doses (5/10/5-10) Learning Readiness: Contemplating dietary changes  ANTHROPOMETRICS: Estimated body mass index is 26.09 kg/m as calculated from the following:   Height as of 09/23/21: _0  (1.702 m).   Weight as of an earlier encounter on 10/19/21: 166 lb 9.6 oz (75.6 kg).  WEIGHT HISTORY:  Wt Readings from Last 10 Encounters:  10/19/21 166 lb 9.6 oz (75.6 kg)  09/23/21 166 lb (75.3 kg)  03/13/21 165 lb 12.8 oz (75.2 kg)  01/06/21 161 lb (73 kg)  01/05/21 162 lb 12.8 oz (73.8 kg)  01/02/21 165 lb 6.4 oz (75 kg)  11/24/20 162 lb 9.6 oz (73.8 kg)  11/13/20 162 lb 4.8 oz (73.6 kg)  11/07/20 171 lb 1.6 oz (77.6 kg)  10/23/20 167 lb 14.4 oz (76.2 kg)   SLEEP:need to assess at future visit MEDICATIONS: lantus- "increased today" to 45 units, Trulicity- increased today, Metformin- increased today, Humalog- take 10 units at 3 Pm but often forgets it with evening snack and does not take with coffee.  Estimated TDD: 38-90, ICR using 90-1:5 gram carb  BLOOD SUGAR:she did not bring her meter today but reports only checks evening sugar and it is often high Lab Results   Component Value Date   HGBA1C 12.0 (A) 09/23/2021   HGBA1C >14.0 (A) 11/24/2020   HGBA1C 6.7 (A) 08/18/2020   HGBA1C 6.5 (A) 02/06/2020   HGBA1C 10.2 (A) 10/10/2019     DIETARY INTAKE: Usual eating pattern includes 1 meals and 2 snacks per day. Everyday foods include coffee.  Constipation: need to assess Dining Out (times/week): seldom 24-hr recall:  B ( 9 AM): 1/2 cup coffee with creamer with sugar in it ( ~ 5-15 grams carb) Snk ( 11-2 PM): "juice" like Body Armour/Powerade x 16 oz  (~18 Grams carb) L ( 3 PM): her main meal- Hungry man chicken alfredo (83 grams carb)  Snk ( 8 PM):  chocolate mint bar large ( ~ 25-30 grams carb) or slice of pie with 2-3 scoops ice cream (75 g carb) or 2 handfuls of chips ( ~ 30 grams carb)  with soda(diet) Beverages: diet soda, "juice", water, coffee  Usual physical activity: activities of daily living and some walking  Progress Towards Goal(s):  In progress.   Nutritional Diagnosis:  NB-1.6 Limited adherence to nutrition-related recommendations As related to cravings and competing values.  As evidenced by her report and food recall.    Intervention:  Nutrition education about lower sugar options for higher carb/sugar foods and beverages. The importance of matching meal time insulin like Humalog to food intake Action Goal: decrease sugar intake by opting for sugar free or diet juice, decreased portions of sweets.  Keep intake consistent/about the same to take consistent insulin doses, check blood sugar before and  two hours after sweets to see how much t  Outcome goal: improved blood sugars Coordination of care: discussed care with Hughes Better, pharmD  Teaching Method Utilized: Visual, Auditory,Hands on Handouts given during visit include:future appointments per patient request Barriers to learning/adherence to lifestyle change: competing values Demonstrated degree of understanding via:  Teach Back   Monitoring/Evaluation:  Dietary intake,  exercise, meter, and body weight in 2 week(s) Debera Lat, RD 10/19/2021 11:52 AM. .

## 2021-10-20 ENCOUNTER — Other Ambulatory Visit: Payer: Self-pay

## 2021-10-20 ENCOUNTER — Ambulatory Visit (INDEPENDENT_AMBULATORY_CARE_PROVIDER_SITE_OTHER): Payer: Medicare Other | Admitting: Podiatry

## 2021-10-20 ENCOUNTER — Encounter: Payer: Self-pay | Admitting: Podiatry

## 2021-10-20 DIAGNOSIS — B351 Tinea unguium: Secondary | ICD-10-CM | POA: Diagnosis not present

## 2021-10-20 DIAGNOSIS — E1151 Type 2 diabetes mellitus with diabetic peripheral angiopathy without gangrene: Secondary | ICD-10-CM | POA: Diagnosis not present

## 2021-10-20 DIAGNOSIS — M79674 Pain in right toe(s): Secondary | ICD-10-CM

## 2021-10-20 DIAGNOSIS — L84 Corns and callosities: Secondary | ICD-10-CM

## 2021-10-20 DIAGNOSIS — M79675 Pain in left toe(s): Secondary | ICD-10-CM | POA: Diagnosis not present

## 2021-10-21 ENCOUNTER — Telehealth: Payer: Self-pay | Admitting: Physical Therapy

## 2021-10-21 ENCOUNTER — Ambulatory Visit: Payer: Medicare Other | Admitting: Physical Therapy

## 2021-10-21 NOTE — Telephone Encounter (Signed)
No show for appt on 10/26. Attempted to call but no answer and VM was not accepting new messages.  Windell Norfolk, DPT, PN2   Supplemental Physical Therapist [place]    Pager [phone] Acute Rehab Office [phone]

## 2021-10-25 NOTE — Progress Notes (Signed)
  Subjective:  Patient ID: Susan Holmes, female    DOB: August 17, 1956,  MRN: [id number]  Susan Holmes presents to clinic today for at risk foot care. Pt has h/o NIDDM with PAD and thick, elongated toenails b/l feet which are tender when wearing enclosed shoe gear.  Patient states blood glucose was 213 mg/dl today.  PCP is [name], MD , and last visit was 09/23/2021.  Allergies  Allergen Reactions   Ace Inhibitors Cough   Lipitor [Atorvastatin] Other (See Comments)    MYALGIA    Chantix [Varenicline] Nausea And Vomiting    Review of Systems: Negative except as noted in the HPI. Objective:   Constitutional Susan Holmes is a pleasant 65 y.o. African American female, in NAD. AAO x 3.   Vascular Capillary refill time to digits immediate b/l. Palpable DP pulse(s) b/l lower extremities Nonpalpable PT pulse(s) b/l lower extremities. Pedal hair absent. Lower extremity skin temperature gradient within normal limits. No pain with calf compression RLE. No edema noted b/l LE. No cyanosis or clubbing noted.  Neurologic Normal speech. Oriented to person, place, and time. Protective sensation intact 5/5 intact bilaterally with 10g monofilament b/l. Vibratory sensation intact b/l.  Dermatologic Pedal skin thin, shiny and atrophic b/l LE. Toenails b/l lower extremities elongated, discolored, dystrophic, thickened, and crumbly with subungual debris and tenderness to dorsal palpation. Anonychia noted L hallux and R hallux. Nailbed(s) epithelialized.  Hyperkeratotic lesion(s) submet head 2 right foot, submet head 4 left foot, submet head 5 right foot, and plantar aspect b/l heel pads.  No erythema, no edema, no drainage, no fluctuance. Evidence of retained nail spicules proximal corners b/l great toes.  Orthopedic: Normal muscle strength 5/5 to all lower extremity muscle groups bilaterally. Hammertoe deformity noted 2-5 b/l.   Radiographs: None Assessment:   1. Pain due to onychomycosis of  toenails of both feet   2. Callus   3. Diabetes mellitus type 2 with peripheral artery disease ([place])    Plan:  Patient was evaluated and treated and all questions answered. Consent given for treatment as described below: -No new findings. No new orders. -Continue diabetic foot care principles: inspect feet daily, monitor glucose as recommended by PCP and/or Endocrinologist, and follow prescribed diet per PCP, Endocrinologist and/or dietician. -Toenails 2-5 bilaterally debrided in length and girth without iatrogenic bleeding with sterile nail nipper and dremel.  -Callus(es) submet head 2 right foot, submet head 4 left foot, submet head 5 right foot, and plantar aspect b/l heel pads pared utilizing sterile scalpel blade without complication or incident. Total number debrided =5. -Retained nail spicules b/l great toes debrided. Iatrogenic laceration sustained during debridement of R hallux.  Treated with Lumicain Hemostatic Solution and alcohol. TAO applied. No further treatment required by patient. -Patient/POA to call should there be question/concern in the interim.  Return in about 3 months (around 01/20/2022).  Susan Holmes, DPM

## 2021-10-28 ENCOUNTER — Ambulatory Visit: Payer: Medicare Other | Admitting: Physical Therapy

## 2021-11-02 ENCOUNTER — Other Ambulatory Visit: Payer: Self-pay | Admitting: Internal Medicine

## 2021-11-02 DIAGNOSIS — E119 Type 2 diabetes mellitus without complications: Secondary | ICD-10-CM

## 2021-11-04 ENCOUNTER — Ambulatory Visit (INDEPENDENT_AMBULATORY_CARE_PROVIDER_SITE_OTHER): Payer: Medicare Other | Admitting: Dietician

## 2021-11-04 ENCOUNTER — Ambulatory Visit: Payer: Medicare Other | Admitting: Physical Therapy

## 2021-11-04 ENCOUNTER — Encounter: Payer: Self-pay | Admitting: Dietician

## 2021-11-04 DIAGNOSIS — Z794 Long term (current) use of insulin: Secondary | ICD-10-CM

## 2021-11-04 DIAGNOSIS — Z713 Dietary counseling and surveillance: Secondary | ICD-10-CM

## 2021-11-04 DIAGNOSIS — E119 Type 2 diabetes mellitus without complications: Secondary | ICD-10-CM

## 2021-11-04 NOTE — Patient Instructions (Addendum)
Congratulations!!!   Your blood sugars are better  We talked about taking the 45 units of Lantus as prescribed  And   Skipping Humalog if you do not eat  If you eat a smaller meal/ snack than usual. taking less Humalog  If you continue to have blood sugars less than 80 please call the office and start checking blood sugar more often.  Follow up with Me and Rachelle 1 month- December 9 an Dr. Marva Panda in January.  [name] [phone]

## 2021-11-04 NOTE — Progress Notes (Signed)
Medical Nutrition Therapy:  Appt start time: 3614 end time:  1035. Total time: 20 minutes Visit # 2 Last visit 10-19-21.   Assessment:  Primary concerns today: meal planning for glycemic control  Ms. Koeller says her appetite is decreased most likely from the increased dose of Trulicity. She confirmed she took  3 mg Trulicity on Saturday. She takes 1 pill of metformin in the morning, 40 units lantus at night before bed and Humalog ~ 2-3 times a day- 10 units with coffee and/or lunch and dinner. She confirms symptoms of low blood sugar last week after lunch ~ 45 Pm and thinks she took her Humalog and did not eat lunch.  Her diabetes appears to be better controlled. Here meter was downloaded today and showed the following:  10-19-21 through 11-04-21: 14 readings, 1 in am, 13 in PM, 1 ,70 mg/dl, 5/14 in target, 8/14 above 180 mg/dl, low- 63, highest 213, she thinks the low occurred when she did not eat lunch and took her insulin  Blood sugar much improved.  Concerned about low blood sugars now that her intake of sweets is less and blood sugar better controlled.  She says she has cut out all juice and sports drinks. Her weight is decreased 4# in 2 weeks confirming a change in her diet intake  ANTHROPOMETRICS: Estimated body mass index is 25.45 kg/m as calculated from the following:   Height as of 09/23/21: 5\' 7"  (1.702 m).   Weight as of this encounter: 162 lb 8 oz (73.7 kg).  WEIGHT HISTORY:  Wt Readings from Last 10 Encounters:  11/04/21 162 lb 8 oz (73.7 kg)  10/19/21 166 lb 9.6 oz (75.6 kg)  09/23/21 166 lb (75.3 kg)  03/13/21 165 lb 12.8 oz (75.2 kg)  01/06/21 161 lb (73 kg)  01/05/21 162 lb 12.8 oz (73.8 kg)  01/02/21 165 lb 6.4 oz (75 kg)  11/24/20 162 lb 9.6 oz (73.8 kg)  11/13/20 162 lb 4.8 oz (73.6 kg)  11/07/20 171 lb 1.6 oz (77.6 kg)   SLEEP:need to assess at future visit  Estimated TDD: 40-60, ICR using 50 as TDD -1:9-10 gram carb   DIETARY INTAKE: Usual eating pattern  includes 1 meals and 2 snacks per day. Everyday foods include coffee.  Constipation: need to assess Dining Out (times/week): seldom 24-hr recall:  B ( 9 AM): 1/2 cup coffee with creamer with sugar in it ( ~ 5-15 grams carb) Snk ( 11-2 PM): diet soda L ( 3 PM): her main meal Sometimes a snack before bedtime Beverages: diet soda, water, coffee  Usual physical activity: activities of daily living and some walking  Progress Towards Goal(s):  In progress.   Nutritional Diagnosis:  NB-1.6 Limited adherence to nutrition-related recommendations As related to cravings and competing values is improving.  As evidenced by her report and food recall.    Intervention:  Nutrition education about  meter download, hypoglycemia prevention, symptoms and treatment. The importance of matching meal time insulin like Humalog to food intake Action Goal: self monitor more in the am, call if < 80 mg/dl, take 45 units of lantus as prescribed, do not take Humalog if you do not eat  Outcome goal: improved blood sugars Coordination of care:  with Hughes Better, pharmD  Teaching Method Utilized: Visual, Auditory,Hands on Handouts given during visit include:future appointments per patient request Barriers to learning/adherence to lifestyle change: competing values Demonstrated degree of understanding via:  Teach Back   Monitoring/Evaluation:  Dietary intake, exercise, meter, and  body weight in 4 week(s) Debera Lat, RD 11/04/2021 1:29 PM. .

## 2021-11-09 LAB — HM DIABETES EYE EXAM

## 2021-11-11 ENCOUNTER — Ambulatory Visit: Payer: Medicare Other | Admitting: Physical Therapy

## 2021-11-17 ENCOUNTER — Other Ambulatory Visit: Payer: Self-pay

## 2021-11-17 DIAGNOSIS — I10 Essential (primary) hypertension: Secondary | ICD-10-CM

## 2021-11-17 MED ORDER — LOSARTAN POTASSIUM 100 MG PO TABS: 100.0000 mg | ORAL_TABLET | Freq: Every day | ORAL | 3 refills | Status: DC

## 2021-11-17 MED ORDER — TRULICITY 3 MG/0.5ML ~~LOC~~ SOAJ: 3.0000 mg | SUBCUTANEOUS | 0 refills | Status: DC

## 2021-11-18 ENCOUNTER — Other Ambulatory Visit: Payer: Self-pay

## 2021-11-18 ENCOUNTER — Ambulatory Visit
Admission: RE | Admit: 2021-11-18 | Discharge: 2021-11-18 | Disposition: A | Discharge status: Home / Self Care | Payer: Medicare Other | Source: Ambulatory Visit | Attending: Internal Medicine | Admitting: Internal Medicine

## 2021-11-18 ENCOUNTER — Ambulatory Visit: Payer: Medicare Other | Admitting: Physical Therapy

## 2021-11-25 ENCOUNTER — Ambulatory Visit: Payer: Medicare Other | Admitting: Physical Therapy

## 2021-12-08 ENCOUNTER — Ambulatory Visit: Payer: Medicare Other | Admitting: Dietician

## 2021-12-23 ENCOUNTER — Encounter: Payer: Self-pay | Admitting: Dietician

## 2021-12-31 ENCOUNTER — Encounter: Payer: Self-pay | Admitting: Dietician

## 2022-02-01 ENCOUNTER — Other Ambulatory Visit: Payer: Self-pay | Admitting: Student

## 2022-02-01 DIAGNOSIS — Z794 Long term (current) use of insulin: Secondary | ICD-10-CM

## 2022-02-01 DIAGNOSIS — E119 Type 2 diabetes mellitus without complications: Secondary | ICD-10-CM

## 2022-02-02 ENCOUNTER — Encounter: Payer: Self-pay | Admitting: Podiatry

## 2022-02-02 ENCOUNTER — Other Ambulatory Visit: Payer: Self-pay

## 2022-02-02 ENCOUNTER — Ambulatory Visit (INDEPENDENT_AMBULATORY_CARE_PROVIDER_SITE_OTHER): Payer: 59 | Admitting: Podiatry

## 2022-02-02 DIAGNOSIS — L84 Corns and callosities: Secondary | ICD-10-CM

## 2022-02-02 DIAGNOSIS — B351 Tinea unguium: Secondary | ICD-10-CM | POA: Diagnosis not present

## 2022-02-02 DIAGNOSIS — M79674 Pain in right toe(s): Secondary | ICD-10-CM

## 2022-02-02 DIAGNOSIS — E1151 Type 2 diabetes mellitus with diabetic peripheral angiopathy without gangrene: Secondary | ICD-10-CM | POA: Diagnosis not present

## 2022-02-02 DIAGNOSIS — M79675 Pain in left toe(s): Secondary | ICD-10-CM

## 2022-02-07 NOTE — Progress Notes (Signed)
Subjective: Susan Holmes is a 66 y.o. female patient seen today for follow up of  at risk foot care. Pt has h/o NIDDM with PAD and callus(es) bilaterally and painful thick toenails that are difficult to trim. Painful toenails interfere with ambulation. Aggravating factors include wearing enclosed shoe gear. Pain is relieved with periodic professional debridement. Painful calluses are aggravated when weightbearing with and without shoegear. Pain is relieved with periodic professional debridement..   Patient did not check blood glucose on today.  New problem(s)/concern(s) today: None    PCP is [name], MD. Last visit was: 09/23/2021.  Allergies  Allergen Reactions   Ace Inhibitors Cough   Lipitor [Atorvastatin] Other (See Comments)    MYALGIA    Chantix [Varenicline] Nausea And Vomiting    Objective: Physical Exam  General: Patient is a pleasant 66 y.o. African American female in NAD. AAO x 3.   Neurovascular Examination: Capillary refill time to digits immediate b/l. Palpable DP pulse(s) b/l LE. Diminished PT pulse(s) b/l LE. Pedal hair absent. No pain with calf compression b/l. Lower extremity skin temperature gradient within normal limits. No edema noted b/l LE. No cyanosis or clubbing noted b/l LE.  Protective sensation intact 5/5 intact bilaterally with 10g monofilament b/l. Vibratory sensation intact b/l.  Dermatological:  Pedal skin thin, shiny and atrophic b/l LE. Toenails 2-5 bilaterally elongated, discolored, dystrophic, thickened, and crumbly with subungual debris and tenderness to dorsal palpation. Anonychia noted bilateral great toes. Nailbed(s) epithelialized.  Hyperkeratotic lesion(s) bilateral heels, submet head 2 right foot, submet head 4 left foot, and submet head 5 right foot.  No erythema, no edema, no drainage, no fluctuance.  Musculoskeletal:  Muscle strength 5/5 to all lower extremity muscle groups bilaterally. Hammertoe deformity noted 2-5  b/l.  Assessment: 1. Pain due to onychomycosis of toenails of both feet   2. Callus   3. Diabetes mellitus type 2 with peripheral artery disease (HCC)    Plan: -Examined patient. -Mycotic toenails 2-5 bilaterally were debrided in length and girth with sterile nail nippers and dremel without iatrogenic bleeding. -Callus(es) bilateral heels, submet head 2 right foot, submet head 4 left foot, and submet head 5 right foot pared utilizing sterile scalpel blade without complication or incident. Total number debrided =5. -Patient/POA to call should there be question/concern in the interim.  Return in about 3 months (around 05/02/2022).  [name], DPM

## 2022-02-13 ENCOUNTER — Other Ambulatory Visit: Payer: Self-pay | Admitting: Surgical

## 2022-02-13 ENCOUNTER — Other Ambulatory Visit: Payer: Self-pay | Admitting: Cardiovascular Disease

## 2022-02-15 ENCOUNTER — Other Ambulatory Visit: Payer: Self-pay | Admitting: Cardiovascular Disease

## 2022-02-15 NOTE — Telephone Encounter (Signed)
Looks like her PCP dc'ed this medication so I'd hold off on refilling

## 2022-02-15 NOTE — Telephone Encounter (Signed)
Rx(s) sent to pharmacy electronically.  

## 2022-02-26 DIAGNOSIS — Z0000 Encounter for general adult medical examination without abnormal findings: Secondary | ICD-10-CM | POA: Diagnosis not present

## 2022-02-26 DIAGNOSIS — G894 Chronic pain syndrome: Secondary | ICD-10-CM | POA: Diagnosis not present

## 2022-02-26 DIAGNOSIS — Z79899 Other long term (current) drug therapy: Secondary | ICD-10-CM | POA: Diagnosis not present

## 2022-02-26 DIAGNOSIS — F17200 Nicotine dependence, unspecified, uncomplicated: Secondary | ICD-10-CM | POA: Diagnosis not present

## 2022-02-26 DIAGNOSIS — M11262 Other chondrocalcinosis, left knee: Secondary | ICD-10-CM | POA: Diagnosis not present

## 2022-02-26 DIAGNOSIS — R030 Elevated blood-pressure reading, without diagnosis of hypertension: Secondary | ICD-10-CM | POA: Diagnosis not present

## 2022-02-26 DIAGNOSIS — F17210 Nicotine dependence, cigarettes, uncomplicated: Secondary | ICD-10-CM | POA: Diagnosis not present

## 2022-02-26 DIAGNOSIS — M62830 Muscle spasm of back: Secondary | ICD-10-CM | POA: Diagnosis not present

## 2022-02-26 DIAGNOSIS — M5416 Radiculopathy, lumbar region: Secondary | ICD-10-CM | POA: Diagnosis not present

## 2022-03-02 ENCOUNTER — Other Ambulatory Visit: Payer: Self-pay

## 2022-03-02 DIAGNOSIS — I779 Disorder of arteries and arterioles, unspecified: Secondary | ICD-10-CM

## 2022-03-04 DIAGNOSIS — Z79899 Other long term (current) drug therapy: Secondary | ICD-10-CM | POA: Diagnosis not present

## 2022-03-15 ENCOUNTER — Other Ambulatory Visit: Payer: Self-pay

## 2022-03-15 ENCOUNTER — Other Ambulatory Visit: Payer: Self-pay | Admitting: Cardiovascular Disease

## 2022-03-15 MED ORDER — TRULICITY 3 MG/0.5ML ~~LOC~~ SOAJ: 3.0000 mg | SUBCUTANEOUS | 0 refills | Status: DC

## 2022-03-15 NOTE — Telephone Encounter (Signed)
Rx(s) sent to pharmacy electronically.  

## 2022-03-18 ENCOUNTER — Encounter: Payer: Self-pay | Admitting: Physician Assistant

## 2022-03-18 ENCOUNTER — Ambulatory Visit (INDEPENDENT_AMBULATORY_CARE_PROVIDER_SITE_OTHER)
Admission: RE | Admit: 2022-03-18 | Discharge: 2022-03-18 | Disposition: A | Discharge status: Home / Self Care | Payer: Medicare Other | Source: Ambulatory Visit | Attending: Family Medicine | Admitting: Family Medicine

## 2022-03-18 ENCOUNTER — Other Ambulatory Visit: Payer: Self-pay

## 2022-03-18 ENCOUNTER — Ambulatory Visit (INDEPENDENT_AMBULATORY_CARE_PROVIDER_SITE_OTHER): Payer: Medicare Other | Admitting: Physician Assistant

## 2022-03-18 ENCOUNTER — Ambulatory Visit (HOSPITAL_COMMUNITY)
Admission: RE | Admit: 2022-03-18 | Discharge: 2022-03-18 | Disposition: A | Discharge status: Home / Self Care | Payer: Medicare Other | Source: Ambulatory Visit | Attending: Physician Assistant | Admitting: Physician Assistant

## 2022-03-18 VITALS — BP 158/103 | HR 78 | Temp 97.6°F | Ht 67.0 in | Wt 174.5 lb

## 2022-03-18 DIAGNOSIS — I779 Disorder of arteries and arterioles, unspecified: Secondary | ICD-10-CM

## 2022-03-18 NOTE — Progress Notes (Signed)
[facility] [place] ?HISTORY AND PHYSICAL  ? ?History of Present Illness:  Patient is a 66 y.o. year old female who presents for evaluation of LE symptomatic PAD.  She is s/p  left femoral to above-the-knee popliteal bypass with vein by Dr. Oneida Alar in October 2020.  Prior to that she underwent stenting of left external iliac artery in September 2020.  She states she has edema in her left lower extremity throughout the day.  ? She denies claudication, rest pain or non healing wounds.  She does have left knee pain that gets stiff after sitting and aches during ambulation.  She is medically managed on ASA and Statin daily.  She continues to be an everyday smoker.  ? ? ?Past Medical History:  ?Diagnosis Date  ? Anxiety   ? Arthritis   ? Callus of foot 09/19/2018  ? Chest pain 09/26/2018  ? Claudication ([place]) 09/14/2019  ? Congenital blindness   ? Left  eye  ? Coronary artery disease   ? per cardiac cath 2015-- mild disease involving LAD and branches ,  LCFx and branches, and mild disease pRCA  ? Cough 07/08/2016  ? Depression   ? Diabetes mellitus, type II ([place])   ? followed by pcp  ? Glaucoma   ? Glaucoma, both eyes   ? Hepatitis B infection pt unsure  ? resolved  ? History of kidney stones   ? right stone  ? History of transient ischemic attack (TIA) 2017  ? per pt no residuals  ? History of vitamin D deficiency 09/06/2017  ? HTN (hypertension)   ? followed by pcp  ? Hyperlipidemia   ? PAD (peripheral artery disease) ([place])   ? Paronychia of great toe, left 06/21/2019  ? Poor dental hygiene   ? Stroke [facility]   ? Tobacco abuse   ? .5 PPD smoker  ? Uncontrolled type II diabetes mellitus 06/12/2007  ? Qualifier: Diagnosis of  By: Drinkard MSN, FNP-C, Collie Siad    ? ? ?Past Surgical History:  ?Procedure Laterality Date  ? ABDOMINAL AORTOGRAM W/LOWER EXTREMITY N/A 09/14/2019  ? Procedure: ABDOMINAL AORTOGRAM W/LOWER EXTREMITY;  Surgeon: Elam Dutch, MD;  Location: [facility];  Service: Cardiovascular;   Laterality: N/A;  ? ABDOMINAL HYSTERECTOMY  1980s  ? unsure if ovaries were taken  ? BYPASS GRAFT Left 10/22/2019  ?  Left femoral to above-knee popliteal bypass using reversed ipsilateral greater saphenous vein  ? colonoscopy    ? FEMORAL-POPLITEAL BYPASS GRAFT Left 10/22/2019  ? Procedure: LEFT FEMORAL-ABOVE KNEE POPLITEAL  BYPASS GRAFT with REVERSED GREATER SAPHENOUS VEIN;  Surgeon: Elam Dutch, MD;  Location: [facility];  Service: Vascular;  Laterality: Left;  ? GLAUCOMA SURGERY Right 2017  ? IR URETERAL STENT RIGHT NEW ACCESS W/O SEP NEPHROSTOMY CATH  10/03/2018  ? LEFT HEART CATHETERIZATION WITH CORONARY ANGIOGRAM N/A 10/09/2014  ? Procedure: LEFT HEART CATHETERIZATION WITH CORONARY ANGIOGRAM;  Surgeon: Troy Sine, MD;  Location: [facility] C[facility] LAB;  Service: Cardiovascular;  Laterality: N/A;  ? NEPHROLITHOTOMY Right 10/03/2018  ? Procedure: RIGHT NEPHROLITHOTOMY PERCUTANEOUS FIRST STAGE;  Surgeon: Irine Seal, MD;  Location: [facility];  Service: Urology;  Laterality: Right;  ? PERIPHERAL VASCULAR INTERVENTION Left 09/14/2019  ? Procedure: PERIPHERAL VASCULAR INTERVENTION;  Surgeon: Elam Dutch, MD;  Location: [facility];  Service: Cardiovascular;  Laterality: Left;   left external iliac stent ?  ? TEE WITHOUT CARDIOVERSION N/A 08/17/2016  ? Procedure: TRANSESOPHAGEAL ECHOCARDIOGRAM (TEE);  Surgeon:  Sueanne Margarita, MD;  Location: [facility];  Service: Cardiovascular;  Laterality: N/A;  ? ? ?ROS:  ? ?General:  No weight loss, Fever, chills ? ?HEENT: No recent headaches, no nasal bleeding, no visual changes, no sore throat ? ?Neurologic: No dizziness, blackouts, seizures. No recent symptoms of stroke or mini- stroke. No recent episodes of slurred speech, or temporary blindness. ? ?Cardiac: No recent episodes of chest pain/pressure, no shortness of breath at rest.  No shortness of breath with exertion.  Denies history of atrial fibrillation or irregular heartbeat ? ?Vascular: No history of rest pain in  feet.  No history of claudication.  No history of non-healing ulcer, No history of DVT  ? ?Pulmonary: No home oxygen, no productive cough, no hemoptysis,  No asthma or wheezing ? ?Musculoskeletal:  _0  Arthritis, _1  Low back pain,  _2  Joint pain ? ?Hematologic:No history of hypercoagulable state.  No history of easy bleeding.  No history of anemia ? ?Gastrointestinal: No hematochezia or melena,  No gastroesophageal reflux, no trouble swallowing ? ?Urinary: _3  chronic Kidney disease, _4  on HD - _5  MWF or _6  TTHS, _7  Burning with urination, _8  Frequent urination, _9  Difficulty urinating;  ? ?Skin: No rashes ? ?Psychological: No history of anxiety,  No history of depression ? ?Social History ?Social History  ? ?Tobacco Use  ? Smoking status: Every Day  ?  Packs/day: 0.50  ?  Years: 39.00  ?  Pack years: 19.50  ?  Types: Cigarettes  ?  Start date: 66  ? Smokeless tobacco: Current  ?  Types: Chew  ? Tobacco comments:  ?  6 cigs per day  ?Vaping Use  ? Vaping Use: Never used  ?Substance Use Topics  ? Alcohol use: No  ?  Alcohol/week: 0.0 standard drinks  ? Drug use: No  ? ? ?Family History ?Family History  ?Problem Relation Age of Onset  ? Heart disease Mother 79  ? Hypertension Mother   ? CVA Mother   ? Heart disease Father 62  ? CVA Sister   ? Heart disease Sister   ? Aneurysm Sister   ? CVA Brother   ? Aneurysm Brother   ? CVA Maternal Grandmother   ? CVA Maternal Grandfather   ? Hypertension Brother   ? Colon cancer Neg Hx   ? Esophageal cancer Neg Hx   ? Stomach cancer Neg Hx   ? ? ?Allergies ? ?Allergies  ?Allergen Reactions  ? Ace Inhibitors Cough  ? Lipitor [Atorvastatin] Other (See Comments)  ?  MYALGIA ?  ? Chantix [Varenicline] Nausea And Vomiting  ? ? ? ?Current Outpatient Medications  ?Medication Sig Dispense Refill  ? amLODipine (NORVASC) 10 MG tablet Take 1 tablet (10 mg total) by mouth daily. 90 tablet 3  ? aspirin EC 81 MG tablet Take 1 tablet (81 mg total) by mouth daily. 90 tablet 1  ?  baclofen (LIORESAL) 10 MG tablet Take 10 mg by mouth 3 (three) times daily as needed. (Patient not taking: Reported on 10/19/2021)    ? BINAXNOW COVID-19 AG HOME TEST KIT TEST AS DIRECTED TODAY    ? Blood Glucose Monitoring Suppl (ONETOUCH VERIO) w/Device KIT Use as directed 1 kit 0  ? busPIRone (BUSPAR) 10 MG tablet buspirone 10 mg tablet ? TAKE 1 TABLET BY MOUTH THREE TIMES DAILY    ? celecoxib (CELEBREX) 50 MG capsule Take 50 mg by mouth 2 (two) times daily.    ?  cetirizine (ZYRTEC) 10 MG tablet Take 10 mg by mouth daily as needed.    ? diclofenac Sodium (VOLTAREN) 1 % GEL Apply 2 g topically daily as needed (pain).     ? dorzolamide-timolol (COSOPT) 22.3-6.8 MG/ML ophthalmic solution Place 1 drop into both eyes every evening. 10 mL 1  ? Dulaglutide (TRULICITY) 3 KF/2.7MD SOPN Inject 3 mg as directed once a week. 2 mL 0  ? Garlic (GARLIQUE PO) Take by mouth.    ? glucose blood (ONETOUCH VERIO) test strip Use as instructed 100 each 12  ? hydrOXYzine (ATARAX/VISTARIL) 25 MG tablet hydroxyzine HCl 25 mg tablet ? TAKE 1 TABLET BY MOUTH THREE TIMES DAILY AS NEEDED (Patient not taking: Reported on 10/19/2021)    ? insulin glargine (LANTUS SOLOSTAR) 100 UNIT/ML Solostar Pen Inject 40 Units into the skin at bedtime. 15 mL 2  ? insulin lispro (HUMALOG KWIKPEN) 100 UNIT/ML KwikPen Inject 15 Units into the skin 3 (three) times daily. 15 mL 11  ? Insulin Pen Needle 32G X 4 MM MISC Use to inject insulin 4 times a day 360 each 3  ? lidocaine (XYLOCAINE) 5 % ointment Apply 1 application topically daily as needed for moderate pain.     ? Lidocaine 4 % PTCH Aspercreme (lidocaine) 4 % topical patch ? apply to affected area 12 hrs per day    ? LINZESS 290 MCG CAPS capsule Take 290 mcg by mouth daily as needed.    ? losartan (COZAAR) 100 MG tablet Take 1 tablet (100 mg total) by mouth daily. 90 tablet 3  ? lubiprostone (AMITIZA) 24 MCG capsule lubiprostone 24 mcg capsule ? TAKE 1 CAPSULE BY MOUTH DAILY AS NEEDED    ? metFORMIN  (GLUCOPHAGE XR) 500 MG 24 hr tablet Take 1 tablet (500 mg total) by mouth 2 (two) times daily with a meal. 60 tablet 2  ? metFORMIN (GLUCOPHAGE) 500 MG tablet Take 500 mg by mouth daily.    ? mirtazapine (REMERON)

## 2022-03-25 DIAGNOSIS — M5416 Radiculopathy, lumbar region: Secondary | ICD-10-CM | POA: Diagnosis not present

## 2022-03-25 DIAGNOSIS — F17210 Nicotine dependence, cigarettes, uncomplicated: Secondary | ICD-10-CM | POA: Diagnosis not present

## 2022-03-25 DIAGNOSIS — M11262 Other chondrocalcinosis, left knee: Secondary | ICD-10-CM | POA: Diagnosis not present

## 2022-03-25 DIAGNOSIS — R030 Elevated blood-pressure reading, without diagnosis of hypertension: Secondary | ICD-10-CM | POA: Diagnosis not present

## 2022-03-25 DIAGNOSIS — M62830 Muscle spasm of back: Secondary | ICD-10-CM | POA: Diagnosis not present

## 2022-03-25 DIAGNOSIS — G894 Chronic pain syndrome: Secondary | ICD-10-CM | POA: Diagnosis not present

## 2022-03-25 DIAGNOSIS — F17200 Nicotine dependence, unspecified, uncomplicated: Secondary | ICD-10-CM | POA: Diagnosis not present

## 2022-04-23 DIAGNOSIS — M542 Cervicalgia: Secondary | ICD-10-CM | POA: Diagnosis not present

## 2022-04-23 DIAGNOSIS — Z79899 Other long term (current) drug therapy: Secondary | ICD-10-CM | POA: Diagnosis not present

## 2022-04-23 DIAGNOSIS — M25562 Pain in left knee: Secondary | ICD-10-CM | POA: Diagnosis not present

## 2022-04-23 DIAGNOSIS — M11262 Other chondrocalcinosis, left knee: Secondary | ICD-10-CM | POA: Diagnosis not present

## 2022-04-23 DIAGNOSIS — G8929 Other chronic pain: Secondary | ICD-10-CM | POA: Diagnosis not present

## 2022-04-27 DIAGNOSIS — Z79899 Other long term (current) drug therapy: Secondary | ICD-10-CM | POA: Diagnosis not present

## 2022-05-04 ENCOUNTER — Ambulatory Visit: Payer: Medicaid Other | Admitting: Podiatry

## 2022-05-12 ENCOUNTER — Ambulatory Visit (HOSPITAL_BASED_OUTPATIENT_CLINIC_OR_DEPARTMENT_OTHER): Payer: Medicare Other | Admitting: Cardiovascular Disease

## 2022-05-17 ENCOUNTER — Ambulatory Visit (INDEPENDENT_AMBULATORY_CARE_PROVIDER_SITE_OTHER): Payer: Medicare Other | Admitting: Podiatry

## 2022-05-17 ENCOUNTER — Other Ambulatory Visit: Payer: Self-pay | Admitting: *Deleted

## 2022-05-17 DIAGNOSIS — M79675 Pain in left toe(s): Secondary | ICD-10-CM | POA: Diagnosis not present

## 2022-05-17 DIAGNOSIS — Z860100 Personal history of colon polyps, unspecified: Secondary | ICD-10-CM

## 2022-05-17 DIAGNOSIS — M79674 Pain in right toe(s): Secondary | ICD-10-CM

## 2022-05-17 DIAGNOSIS — E1151 Type 2 diabetes mellitus with diabetic peripheral angiopathy without gangrene: Secondary | ICD-10-CM

## 2022-05-17 DIAGNOSIS — E1165 Type 2 diabetes mellitus with hyperglycemia: Secondary | ICD-10-CM | POA: Insufficient documentation

## 2022-05-17 DIAGNOSIS — B351 Tinea unguium: Secondary | ICD-10-CM | POA: Diagnosis not present

## 2022-05-17 DIAGNOSIS — L84 Corns and callosities: Secondary | ICD-10-CM | POA: Diagnosis not present

## 2022-05-17 DIAGNOSIS — M8580 Other specified disorders of bone density and structure, unspecified site: Secondary | ICD-10-CM | POA: Insufficient documentation

## 2022-05-17 HISTORY — DX: Personal history of colon polyps, unspecified: Z86.0100

## 2022-05-17 MED ORDER — TRULICITY 3 MG/0.5ML ~~LOC~~ SOAJ: 3.0000 mg | SUBCUTANEOUS | 0 refills | Status: DC

## 2022-05-17 NOTE — Telephone Encounter (Signed)
Please have patient schedule an office visit as soon as possible for diabetes follow up.

## 2022-05-17 NOTE — Telephone Encounter (Signed)
LOV with Dr Marva Panda was 09/23/21.

## 2022-05-18 ENCOUNTER — Other Ambulatory Visit: Payer: Self-pay

## 2022-05-18 DIAGNOSIS — E119 Type 2 diabetes mellitus without complications: Secondary | ICD-10-CM

## 2022-05-18 MED ORDER — LANTUS SOLOSTAR 100 UNIT/ML ~~LOC~~ SOPN: 40.0000 [IU] | PEN_INJECTOR | Freq: Every day | SUBCUTANEOUS | 2 refills | Status: DC

## 2022-05-18 NOTE — Telephone Encounter (Signed)
Lantus 40U refilled. Patient needs appointment for diabetes follow up as soon as possible. Last A1c 12.0 in September 2022.

## 2022-05-19 ENCOUNTER — Ambulatory Visit (HOSPITAL_BASED_OUTPATIENT_CLINIC_OR_DEPARTMENT_OTHER): Payer: Medicare Other | Admitting: Cardiovascular Disease

## 2022-05-21 ENCOUNTER — Encounter: Payer: Self-pay | Admitting: Physical Therapy

## 2022-05-21 NOTE — Therapy (Signed)
[place] [place], [place], [address] Phone: [phone]   Fax:  [phone]  Patient Details  Name: Susan Holmes MRN: [id number] Date of Birth: [birth year] Referring Provider:  No ref. provider found  Encounter Date: 05/21/2022  PHYSICAL THERAPY DISCHARGE SUMMARY  Visits from Start of Care: 1  Current functional level related to goals / functional outcomes: Did not return since eval   Remaining deficits: Unable to assess    Education / Equipment: N/A    Patient agrees to discharge. Patient goals were not met. Patient is being discharged due to not returning since the last visit.   Ann Lions PT, DPT, PN2   Supplemental Physical Therapist [place] [place], [place], [address] Phone: [phone]   Fax:  [phone]

## 2022-05-22 ENCOUNTER — Encounter: Payer: Self-pay | Admitting: Podiatry

## 2022-05-22 NOTE — Progress Notes (Signed)
  Subjective:  Patient ID: Susan Holmes, female    DOB: 10/07/1956,  MRN: [id number]  Susan Holmes presents to clinic today for at risk foot care. Pt has h/o NIDDM with PAD and callus(es) b/l lower extremities and painful thick toenails that are difficult to trim. Painful toenails interfere with ambulation. Aggravating factors include wearing enclosed shoe gear. Pain is relieved with periodic professional debridement. Painful calluses are aggravated when weightbearing with and without shoegear. Pain is relieved with periodic professional debridement.  Patient states blood glucose was 120 mg/dl today.  Last known HgA1c was 12% several months ago.  New problem(s): None.   PCP is [name], MD , and last visit was September 23, 2021.  Allergies  Allergen Reactions   Ace Inhibitors Cough   Lipitor [Atorvastatin] Other (See Comments)    MYALGIA    Chantix [Varenicline] Nausea And Vomiting    Review of Systems: Negative except as noted in the HPI.  Objective: No changes noted in today's physical examination.  General: Patient is a pleasant 66 y.o. African American female in NAD. AAO x 3.   Neurovascular Examination: Capillary refill time to digits immediate b/l. Palpable DP pulse(s) b/l LE. Diminished PT pulse(s) b/l LE. Pedal hair absent. No pain with calf compression b/l. Lower extremity skin temperature gradient within normal limits. No edema noted b/l LE. No cyanosis or clubbing noted b/l LE.  Protective sensation intact 5/5 intact bilaterally with 10g monofilament b/l. Vibratory sensation intact b/l.  Dermatological:  Pedal skin thin, shiny and atrophic b/l LE. Toenails 2-5 bilaterally elongated, discolored, dystrophic, thickened, and crumbly with subungual debris and tenderness to dorsal palpation. Anonychia noted bilateral great toes. Nailbed(s) epithelialized.  Hyperkeratotic lesion(s) submet head 5 right foot.  No erythema, no edema, no drainage, no fluctuance.  Porokeratotic lesion submet head 4 left foot. No erythema, no edema, no drainage, no fluctuance.  Musculoskeletal:  Muscle strength 5/5 to all lower extremity muscle groups bilaterally. Hammertoe deformity noted 2-5 b/l.    Latest Ref Rng & Units 09/23/2021    2:30 PM  Hemoglobin A1C  Hemoglobin-A1c 4.0 - 5.6 % 12.0     Assessment/Plan: 1. Pain due to onychomycosis of toenails of both feet   2. Callus   3. Diabetes mellitus type 2 with peripheral artery disease ([place])      -Patient was evaluated and treated. All patient's and/or POA's questions/concerns answered on today's visit. -Patient to continue soft, supportive shoe gear daily. -Toenails 2-5 bilaterally debrided in length and girth without iatrogenic bleeding with sterile nail nipper and dremel.  -Callus(es) submet head 5 left foot pared utilizing sterile scalpel blade without complication or incident. Total number debrided =1. -Porokeratotic lesion(s) submet head 4 left foot pared and enucleated with sterile scalpel blade without incident. Total number of lesions debrided=1. -Patient/POA to call should there be question/concern in the interim.   Return in about 3 months (around 08/17/2022).  [name], DPM

## 2022-05-25 DIAGNOSIS — G894 Chronic pain syndrome: Secondary | ICD-10-CM | POA: Diagnosis not present

## 2022-05-25 DIAGNOSIS — Z9181 History of falling: Secondary | ICD-10-CM | POA: Diagnosis not present

## 2022-05-25 DIAGNOSIS — F17210 Nicotine dependence, cigarettes, uncomplicated: Secondary | ICD-10-CM | POA: Diagnosis not present

## 2022-05-25 DIAGNOSIS — F17200 Nicotine dependence, unspecified, uncomplicated: Secondary | ICD-10-CM | POA: Diagnosis not present

## 2022-05-25 DIAGNOSIS — Z79899 Other long term (current) drug therapy: Secondary | ICD-10-CM | POA: Diagnosis not present

## 2022-05-25 DIAGNOSIS — E119 Type 2 diabetes mellitus without complications: Secondary | ICD-10-CM | POA: Diagnosis not present

## 2022-05-25 DIAGNOSIS — M11262 Other chondrocalcinosis, left knee: Secondary | ICD-10-CM | POA: Diagnosis not present

## 2022-05-25 DIAGNOSIS — M5416 Radiculopathy, lumbar region: Secondary | ICD-10-CM | POA: Diagnosis not present

## 2022-05-25 DIAGNOSIS — M62830 Muscle spasm of back: Secondary | ICD-10-CM | POA: Diagnosis not present

## 2022-05-26 ENCOUNTER — Telehealth: Payer: Self-pay

## 2022-05-26 DIAGNOSIS — E119 Type 2 diabetes mellitus without complications: Secondary | ICD-10-CM

## 2022-05-26 NOTE — Telephone Encounter (Signed)
insulin glargine (LANTUS SOLOSTAR) 100 UNIT/ML Solostar Pen  [id number] - [place], [place] - [address] AT [address]

## 2022-05-27 DIAGNOSIS — Z79899 Other long term (current) drug therapy: Secondary | ICD-10-CM | POA: Diagnosis not present

## 2022-05-27 NOTE — Telephone Encounter (Signed)
Refill for Lantus SoloStar called to [facility] not received in the Pharmacy.

## 2022-05-28 MED ORDER — LANTUS SOLOSTAR 100 UNIT/ML ~~LOC~~ SOPN: 40.0000 [IU] | PEN_INJECTOR | Freq: Every day | SUBCUTANEOUS | 2 refills | Status: DC

## 2022-05-28 NOTE — Telephone Encounter (Signed)
Refill for Lantus sent to [facility].

## 2022-05-31 ENCOUNTER — Other Ambulatory Visit: Payer: Self-pay

## 2022-06-01 ENCOUNTER — Encounter: Payer: Medicare Other | Admitting: Internal Medicine

## 2022-06-02 MED ORDER — METFORMIN HCL ER 500 MG PO TB24: 500.0000 mg | ORAL_TABLET | Freq: Two times a day (BID) | ORAL | 2 refills | Status: DC

## 2022-06-03 ENCOUNTER — Other Ambulatory Visit: Payer: Self-pay | Admitting: Cardiovascular Disease

## 2022-06-04 ENCOUNTER — Other Ambulatory Visit: Payer: Self-pay | Admitting: Cardiovascular Disease

## 2022-06-04 NOTE — Telephone Encounter (Signed)
Rx(s) sent to pharmacy electronically.  

## 2022-06-14 ENCOUNTER — Other Ambulatory Visit: Payer: Self-pay

## 2022-06-15 MED ORDER — TRULICITY 3 MG/0.5ML ~~LOC~~ SOAJ: 3.0000 mg | SUBCUTANEOUS | 1 refills | Status: DC

## 2022-06-16 DIAGNOSIS — M5416 Radiculopathy, lumbar region: Secondary | ICD-10-CM | POA: Diagnosis not present

## 2022-06-16 DIAGNOSIS — E119 Type 2 diabetes mellitus without complications: Secondary | ICD-10-CM | POA: Diagnosis not present

## 2022-06-16 DIAGNOSIS — M11262 Other chondrocalcinosis, left knee: Secondary | ICD-10-CM | POA: Diagnosis not present

## 2022-06-16 DIAGNOSIS — Z79899 Other long term (current) drug therapy: Secondary | ICD-10-CM | POA: Diagnosis not present

## 2022-06-16 DIAGNOSIS — M62830 Muscle spasm of back: Secondary | ICD-10-CM | POA: Diagnosis not present

## 2022-06-16 DIAGNOSIS — Z9181 History of falling: Secondary | ICD-10-CM | POA: Diagnosis not present

## 2022-06-16 DIAGNOSIS — F17210 Nicotine dependence, cigarettes, uncomplicated: Secondary | ICD-10-CM | POA: Diagnosis not present

## 2022-06-16 DIAGNOSIS — G894 Chronic pain syndrome: Secondary | ICD-10-CM | POA: Diagnosis not present

## 2022-06-16 DIAGNOSIS — F17200 Nicotine dependence, unspecified, uncomplicated: Secondary | ICD-10-CM | POA: Diagnosis not present

## 2022-06-22 ENCOUNTER — Encounter: Payer: Self-pay | Admitting: Student

## 2022-06-22 ENCOUNTER — Ambulatory Visit (INDEPENDENT_AMBULATORY_CARE_PROVIDER_SITE_OTHER): Payer: Medicare Other | Admitting: Student

## 2022-06-22 VITALS — BP 144/84 | HR 94 | Temp 98.4°F | Wt 172.0 lb

## 2022-06-22 DIAGNOSIS — M170 Bilateral primary osteoarthritis of knee: Secondary | ICD-10-CM | POA: Diagnosis not present

## 2022-06-22 DIAGNOSIS — Z0000 Encounter for general adult medical examination without abnormal findings: Secondary | ICD-10-CM | POA: Diagnosis not present

## 2022-06-22 DIAGNOSIS — Z23 Encounter for immunization: Secondary | ICD-10-CM | POA: Diagnosis not present

## 2022-06-22 DIAGNOSIS — Z794 Long term (current) use of insulin: Secondary | ICD-10-CM | POA: Diagnosis not present

## 2022-06-22 DIAGNOSIS — E119 Type 2 diabetes mellitus without complications: Secondary | ICD-10-CM | POA: Diagnosis not present

## 2022-06-22 DIAGNOSIS — I10 Essential (primary) hypertension: Secondary | ICD-10-CM

## 2022-06-22 DIAGNOSIS — F17210 Nicotine dependence, cigarettes, uncomplicated: Secondary | ICD-10-CM

## 2022-06-22 LAB — POCT GLYCOSYLATED HEMOGLOBIN (HGB A1C): Hemoglobin A1C: 6.9 % — AB (ref 4.0–5.6)

## 2022-06-22 LAB — GLUCOSE, CAPILLARY: Glucose-Capillary: 149 mg/dL — ABNORMAL HIGH (ref 70–99)

## 2022-06-22 MED ORDER — INSULIN LISPRO (1 UNIT DIAL) 100 UNIT/ML (KWIKPEN): 10.0000 [IU] | PEN_INJECTOR | Freq: Three times a day (TID) | SUBCUTANEOUS | 6 refills | Status: DC

## 2022-06-22 MED ORDER — TRULICITY 3 MG/0.5ML ~~LOC~~ SOAJ: 3.0000 mg | SUBCUTANEOUS | 5 refills | Status: DC

## 2022-06-22 MED ORDER — LANTUS SOLOSTAR 100 UNIT/ML ~~LOC~~ SOPN: 45.0000 [IU] | PEN_INJECTOR | Freq: Every day | SUBCUTANEOUS | 8 refills | Status: DC

## 2022-06-22 MED ORDER — DICLOFENAC SODIUM 1 % EX GEL: 4.0000 g | Freq: Three times a day (TID) | CUTANEOUS | 2 refills | Status: DC | PRN

## 2022-06-22 MED ORDER — ONETOUCH DELICA LANCETS 33G MISC: 11 refills | Status: DC

## 2022-06-22 MED ORDER — INSULIN PEN NEEDLE 32G X 4 MM MISC: 3 refills | Status: DC

## 2022-06-22 MED ORDER — METFORMIN HCL ER 500 MG PO TB24: 500.0000 mg | ORAL_TABLET | Freq: Two times a day (BID) | ORAL | 2 refills | Status: DC

## 2022-06-22 MED ORDER — AMLODIPINE BESYLATE 10 MG PO TABS: 10.0000 mg | ORAL_TABLET | Freq: Every day | ORAL | 3 refills | Status: DC

## 2022-06-22 NOTE — Assessment & Plan Note (Signed)
Today's Vitals   06/22/22 0910 06/22/22 0930  BP: (!) 143/89 (!) 144/84  Pulse: (!) 101 94  Temp: 98.4 F (36.9 C)   TempSrc: Oral   SpO2: 99%   Weight: 172 lb (78 kg)    Body mass index is 26.94 kg/m.  Patient with history of HTN, on losartan and norvasc. She has run out of norvasc and thus was unable to take it today. BP slightly above goal, but expect improvement with norvasc.   Plan: -continue losartan 100mg  daily -refilled norvasc 10mg  daily -BP recheck at next visit

## 2022-06-22 NOTE — Progress Notes (Signed)
   CC: f/u T2DM, OA, HTN  HPI:  Susan Holmes is a 66 y.o. female with history listed below presenting to the [facility] for f/u of T2DM, OA, HTN. Please see individualized problem based charting for full HPI.  Past Medical History:  Diagnosis Date   Anxiety    Arthritis    Callus of foot 09/19/2018   Chest pain 09/26/2018   Claudication (HCC) 09/14/2019   Congenital blindness    Left  eye   Coronary artery disease    per cardiac cath 2015-- mild disease involving LAD and branches ,  LCFx and branches, and mild disease pRCA   Cough 07/08/2016   Depression    Diabetes mellitus, type II (HCC)    followed by pcp   Glaucoma    Glaucoma, both eyes    Hepatitis B infection pt unsure   resolved   History of kidney stones    right stone   History of transient ischemic attack (TIA) 2017   per pt no residuals   History of vitamin D deficiency 09/06/2017   HTN (hypertension)    followed by pcp   Hyperlipidemia    PAD (peripheral artery disease) (HCC)    Paronychia of great toe, left 06/21/2019   Poor dental hygiene    Stroke (HCC)    Tobacco abuse    .5 PPD smoker   Uncontrolled type II diabetes mellitus 06/12/2007   Qualifier: Diagnosis of  By: Drinkard MSN, FNP-C, Fannie Knee      Review of Systems:  Negative aside from that listed in individualized problem based charting.  Physical Exam:  Vitals:   06/22/22 0910 06/22/22 0930  BP: (!) 143/89 (!) 144/84  Pulse: (!) 101 94  Temp: 98.4 F (36.9 C)   TempSrc: Oral   SpO2: 99%   Weight: 172 lb (78 kg)    Physical Exam Constitutional:      Appearance: Normal appearance. She is not ill-appearing.  HENT:     Mouth/Throat:     Mouth: Mucous membranes are moist.     Pharynx: Oropharynx is clear.  Eyes:     Extraocular Movements: Extraocular movements intact.     Conjunctiva/sclera: Conjunctivae normal.     Pupils: Pupils are equal, round, and reactive to light.  Cardiovascular:     Rate and Rhythm: Normal rate and regular rhythm.      Pulses: Normal pulses.     Heart sounds: Normal heart sounds. No murmur heard.    No gallop.  Pulmonary:     Effort: Pulmonary effort is normal.     Breath sounds: Normal breath sounds. No wheezing, rhonchi or rales.  Abdominal:     General: Bowel sounds are normal. There is no distension.     Palpations: Abdomen is soft.     Tenderness: There is no abdominal tenderness.  Musculoskeletal:        General: No swelling or deformity.     Comments: Left knee partially limited ROM, no redness, warmth, swelling noted.  Skin:    General: Skin is warm and dry.  Neurological:     Mental Status: She is alert.  Psychiatric:        Mood and Affect: Mood normal.        Behavior: Behavior normal.      Assessment & Plan:   See Encounters Tab for problem based charting.  Patient discussed with Dr. Criselda Peaches

## 2022-06-22 NOTE — Progress Notes (Signed)
Subjective:   Susan Holmes is a 66 y.o. female who presents for Medicare Annual (Subsequent) preventive examination.I connected with  Susan Holmes on 06/22/22 in person and verified that I am speaking with the correct person using two identifiers.   I discussed the limitations of evaluation and management by telemedicine. The patient expressed understanding and agreed to proceed.   Review of Systems    Defer to pcp       Objective:    Today's Vitals   06/22/22 0930  BP: (!) 144/84  Pulse: 94  Temp: 98.4 F (36.9 C)  TempSrc: Oral  SpO2: 99%  Weight: 172 lb (78 kg)   Body mass index is 26.94 kg/m.     06/22/2022   11:55 AM 06/22/2022    9:13 AM 10/13/2021    9:35 AM 09/23/2021    1:17 PM 01/05/2021   12:04 PM 01/05/2021   10:47 AM 01/02/2021   12:19 PM  Advanced Directives  Does Patient Have a Medical Advance Directive? No No No No No No No  Would patient like information on creating a medical advance directive? No - Patient declined No - Patient declined No - Patient declined No - Patient declined No - Patient declined No - Patient declined No - Patient declined    Current Medications (verified) Outpatient Encounter Medications as of 06/22/2022  Medication Sig   aspirin EC 81 MG tablet Take 1 tablet (81 mg total) by mouth daily.   Blood Glucose Monitoring Suppl (ONETOUCH VERIO) w/Device KIT Use as directed   busPIRone (BUSPAR) 10 MG tablet buspirone 10 mg tablet  TAKE 1 TABLET BY MOUTH THREE TIMES DAILY   celecoxib (CELEBREX) 50 MG capsule Take 50 mg by mouth 2 (two) times daily.   cetirizine (ZYRTEC) 10 MG tablet Take 10 mg by mouth daily as needed.   dorzolamide-timolol (COSOPT) 22.3-6.8 MG/ML ophthalmic solution Place 1 drop into both eyes every evening.   Garlic (GARLIQUE PO) Take by mouth.   glucose blood (ONETOUCH VERIO) test strip Use as instructed   hydrOXYzine (ATARAX/VISTARIL) 25 MG tablet    LINZESS 290 MCG CAPS capsule Take 290 mcg by mouth daily as  needed.   losartan (COZAAR) 100 MG tablet Take 1 tablet (100 mg total) by mouth daily.   lubiprostone (AMITIZA) 24 MCG capsule lubiprostone 24 mcg capsule  TAKE 1 CAPSULE BY MOUTH DAILY AS NEEDED   mirtazapine (REMERON) 45 MG tablet Take 1 tablet (45 mg total) by mouth at bedtime.   NARCAN 4 MG/0.1ML LIQD nasal spray kit Place 1 spray into the nose once.   OLANZapine (ZYPREXA) 2.5 MG tablet Take 2.5 mg by mouth at bedtime.   oxyCODONE (ROXICODONE) 15 MG immediate release tablet Take 15 mg by mouth every other day as needed for pain.    pantoprazole (PROTONIX) 40 MG tablet Take 1 tablet by mouth daily.   prednisoLONE acetate (PRED FORTE) 1 % ophthalmic suspension Place 1 drop into the right eye daily.   rosuvastatin (CRESTOR) 20 MG tablet TAKE 1 TABLET(20 MG) BY MOUTH AT BEDTIME   traZODone (DESYREL) 100 MG tablet Take 300 mg by mouth at bedtime.   venlafaxine XR (EFFEXOR-XR) 75 MG 24 hr capsule Take 75 mg by mouth every morning.   [DISCONTINUED] amLODipine (NORVASC) 10 MG tablet Take 1 tablet (10 mg total) by mouth daily.   [DISCONTINUED] baclofen (LIORESAL) 10 MG tablet Take 10 mg by mouth 3 (three) times daily as needed.   [DISCONTINUED] BINAXNOW COVID-19 AG  HOME TEST KIT TEST AS DIRECTED TODAY   [DISCONTINUED] diclofenac Sodium (VOLTAREN) 1 % GEL Apply 2 g topically daily as needed (pain).    [DISCONTINUED] Dulaglutide (TRULICITY) 3 LY/6.5KP SOPN Inject 3 mg as directed once a week.   [DISCONTINUED] insulin glargine (LANTUS SOLOSTAR) 100 UNIT/ML Solostar Pen Inject 40 Units into the skin at bedtime.   [DISCONTINUED] insulin lispro (HUMALOG KWIKPEN) 100 UNIT/ML KwikPen Inject 15 Units into the skin 3 (three) times daily.   [DISCONTINUED] Insulin Pen Needle 32G X 4 MM MISC Use to inject insulin 4 times a day   [DISCONTINUED] lidocaine (XYLOCAINE) 5 % ointment Apply 1 application topically daily as needed for moderate pain.    [DISCONTINUED] Lidocaine 4 % PTCH Aspercreme (lidocaine) 4 % topical  patch  apply to affected area 12 hrs per day   [DISCONTINUED] metFORMIN (GLUCOPHAGE XR) 500 MG 24 hr tablet Take 1 tablet (500 mg total) by mouth 2 (two) times daily with a meal.   [DISCONTINUED] metFORMIN (GLUCOPHAGE) 500 MG tablet Take 500 mg by mouth daily.   [DISCONTINUED] OneTouch Delica Lancets 54S MISC Use with one touch Delica lancing device and Accu chek Guideme meter and Accu Chek guide strips to check your blood sugar up to 3 times a day   [DISCONTINUED] tiZANidine (ZANAFLEX) 4 MG tablet    No facility-administered encounter medications on file as of 06/22/2022.    Allergies (verified) Ace inhibitors, Lipitor [atorvastatin], and Chantix [varenicline]   History: Past Medical History:  Diagnosis Date   Anxiety    Arthritis    Callus of foot 09/19/2018   Chest pain 09/26/2018   Claudication ([place]) 09/14/2019   Congenital blindness    Left  eye   Coronary artery disease    per cardiac cath 2015-- mild disease involving LAD and branches ,  LCFx and branches, and mild disease pRCA   Cough 07/08/2016   Depression    Diabetes mellitus, type II ([place])    followed by pcp   Glaucoma    Glaucoma, both eyes    Hepatitis B infection pt unsure   resolved   History of kidney stones    right stone   History of transient ischemic attack (TIA) 2017   per pt no residuals   History of vitamin D deficiency 09/06/2017   HTN (hypertension)    followed by pcp   Hyperlipidemia    PAD (peripheral artery disease) ([facility])    Paronychia of great toe, left 06/21/2019   Poor dental hygiene    Stroke ([place])    Tobacco abuse    .5 PPD smoker   Uncontrolled type II diabetes mellitus 06/12/2007   Qualifier: Diagnosis of  By: Drinkard MSN, FNP-C, Collie Siad     Past Surgical History:  Procedure Laterality Date   ABDOMINAL AORTOGRAM W/LOWER EXTREMITY N/A 09/14/2019   Procedure: ABDOMINAL AORTOGRAM W/LOWER EXTREMITY;  Surgeon: Elam Dutch, MD;  Location: [facility];  Service: Cardiovascular;   Laterality: N/A;   ABDOMINAL HYSTERECTOMY  1980s   unsure if ovaries were taken   BYPASS GRAFT Left 10/22/2019    Left femoral to above-knee popliteal bypass using reversed ipsilateral greater saphenous vein   colonoscopy     FEMORAL-POPLITEAL BYPASS GRAFT Left 10/22/2019   Procedure: LEFT FEMORAL-ABOVE KNEE POPLITEAL  BYPASS GRAFT with REVERSED GREATER SAPHENOUS VEIN;  Surgeon: Elam Dutch, MD;  Location: [facility];  Service: Vascular;  Laterality: Left;   GLAUCOMA SURGERY Right 2017   IR URETERAL STENT RIGHT NEW ACCESS  W/O SEP NEPHROSTOMY CATH  10/03/2018   LEFT HEART CATHETERIZATION WITH CORONARY ANGIOGRAM N/A 10/09/2014   Procedure: LEFT HEART CATHETERIZATION WITH CORONARY ANGIOGRAM;  Surgeon: Troy Sine, MD;  Location: [facility];  Service: Cardiovascular;  Laterality: N/A;   NEPHROLITHOTOMY Right 10/03/2018   Procedure: RIGHT NEPHROLITHOTOMY PERCUTANEOUS FIRST STAGE;  Surgeon: Irine Seal, MD;  Location: [facility];  Service: Urology;  Laterality: Right;   PERIPHERAL VASCULAR INTERVENTION Left 09/14/2019   Procedure: PERIPHERAL VASCULAR INTERVENTION;  Surgeon: Elam Dutch, MD;  Location: [facility];  Service: Cardiovascular;  Laterality: Left;   left external iliac stent    TEE WITHOUT CARDIOVERSION N/A 08/17/2016   Procedure: TRANSESOPHAGEAL ECHOCARDIOGRAM (TEE);  Surgeon: Sueanne Margarita, MD;  Location: [facility] ENDOSCOPY;  Service: Cardiovascular;  Laterality: N/A;   Family History  Problem Relation Age of Onset   Heart disease Mother 38   Hypertension Mother    CVA Mother    Heart disease Father 27   CVA Sister    Heart disease Sister    Aneurysm Sister    CVA Brother    Aneurysm Brother    CVA Maternal Grandmother    CVA Maternal Grandfather    Hypertension Brother    Colon cancer Neg Hx    Esophageal cancer Neg Hx    Stomach cancer Neg Hx    Social History   Socioeconomic History   Marital status: Divorced    Spouse name: Not on file   Number of children:  1   Years of education: 12   Highest education level: Not on file  Occupational History   Occupation: Disabled  Tobacco Use   Smoking status: Every Day    Packs/day: 0.50    Years: 39.00    Total pack years: 19.50    Types: Cigarettes    Start date: 1981   Smokeless tobacco: Current    Types: Chew   Tobacco comments:    6 cigs per day  Vaping Use   Vaping Use: Never used  Substance and Sexual Activity   Alcohol use: No    Alcohol/week: 0.0 standard drinks of alcohol   Drug use: No   Sexual activity: Not Currently    Birth control/protection: Surgical  Other Topics Concern   Not on file  Social History Narrative   Current Social History 08/18/2020        Patient lives alone in a ground floor apartment which is 1 story. There are 13 steps with handrail up to the entrance the patient uses.       Patient's method of transportation is [facility] transportation.      The highest level of education was 2 years college.      The patient currently disabled.      Identified important Relationships are "My daughter, brothers, grands and great-grands."       Pets : chihuahua Chief Executive Officer)       Interests / Fun: "I don't"       Current Stressors: "Nothing"       Religious / Personal Beliefs: '[employer]aptist"       Other: "I am a very loving person"      L. [place], BSN, RN-BC        Social Determinants of Health   Financial Resource Strain: Low Risk  (06/22/2022)   Overall Financial Resource Strain (CARDIA)    Difficulty of Paying Living Expenses: Not hard at all  Food Insecurity: No Food Insecurity (06/22/2022)   Hunger  Vital Sign    Worried About Charity fundraiser in the Last Year: Never true    Ran Out of Food in the Last Year: Never true  Transportation Needs: Unmet Transportation Needs (06/22/2022)   PRAPARE - Hydrologist (Medical): Yes    Lack of Transportation (Non-Medical): No  Physical Activity: Inactive (06/22/2022)   Exercise Vital Sign    Days  of Exercise per Week: 0 days    Minutes of Exercise per Session: 0 min  Stress: Stress Concern Present (06/22/2022)   [place]    Feeling of Stress : Rather much  Social Connections: Socially Isolated (06/22/2022)   Social Connection and Isolation Panel [NHANES]    Frequency of Communication with Friends and Family: Once a week    Frequency of Social Gatherings with Friends and Family: Once a week    Attends Religious Services: More than 4 times per year    Active Member of [employer] or Organizations: No    Attends Archivist Meetings: Never    Marital Status: Divorced    Tobacco Counseling Ready to quit: Not Answered Counseling given: Not Answered Tobacco comments: 6 cigs per day   Clinical Intake:                 Diabetic?Nutrition Risk Assessment:  Has the patient had any N/V/D within the last 2 months?  No  Does the patient have any non-healing wounds?  No  Has the patient had any unintentional weight loss or weight gain?  No   Diabetes:  Is the patient diabetic?  Yes  If diabetic, was a CBG obtained today?  Yes  Did the patient bring in their glucometer from home?  Yes  How often do you monitor your CBG's? Twice daily   Financial Strains and Diabetes Management:  Are you having any financial strains with the device, your supplies or your medication? No .  Does the patient want to be seen by Chronic Care Management for management of their diabetes?  Yes  Would the patient like to be referred to a Nutritionist or for Diabetic Management?   Followed by Nutritionist  Diabetic Exams:  Diabetic Eye Exam: Completed   Diabetic Foot Exam: Completed             Activities of Daily Living    06/22/2022    9:22 AM 09/23/2021    1:16 PM  In your present state of health, do you have any difficulty performing the following activities:  Hearing? 0 1  Comment  Bilat hearing aids  Vision? 1  1  Comment  glaucoma in right eye  Difficulty concentrating or making decisions? 0 0  Walking or climbing stairs? 0 0  Dressing or bathing? 0 0  Doing errands, shopping? 0 0    Patient Care Team: Lottie Mussel, MD as PCP - General (Internal Medicine) Skeet Latch, MD as PCP - Cardiology (Cardiology) Warden Fillers, MD as Consulting Physician (Ophthalmology) Marlou Sa, Tonna Corner, MD as Consulting Physician (Orthopedic Surgery) Arta Silence, MD as Consulting Physician (Gastroenterology) Leta Baptist, MD as Consulting Physician (Otolaryngology)  Indicate any recent [place] you may have received from other than [facility] providers in the past year (date may be approximate).     Assessment:   This is a routine wellness examination for Rindi.  Hearing/Vision screen No results found.  Dietary issues and exercise activities discussed:     Goals Addressed  None   Depression Screen    06/22/2022   11:54 AM 06/22/2022    9:12 AM 09/23/2021    1:20 PM 01/05/2021   12:03 PM 01/02/2021   12:26 PM 11/07/2020    9:57 AM 10/23/2020    9:16 AM  PHQ 2/9 Scores  PHQ - 2 Score '2 2 1 1 1 3 2  ' PHQ- 9 Score '7 7  7 7 7 8    ' Fall Risk    06/22/2022   11:53 AM 06/22/2022    9:12 AM 09/23/2021    1:24 PM 01/05/2021   12:02 PM 01/02/2021   11:12 AM  Fall Risk   Falls in the past year? 0 0 0 1 1  Number falls in past yr: 0 0  1 1  Injury with Fall? 0 0  0 0  Risk for fall due to : No Fall Risks  No Fall Risks History of fall(s) History of fall(s);Other (Comment)  Risk for fall due to: Comment    Weakness. weakness  Follow up Falls evaluation completed  Falls evaluation completed Falls prevention discussed Falls evaluation completed    FALL RISK PREVENTION PERTAINING TO THE HOME:  Any stairs in or around the home? Yes  If so, are there any without handrails? No  Home free of loose throw rugs in walkways, pet beds, electrical cords, etc? No  Adequate lighting in your home to reduce  risk of falls? Yes   ASSISTIVE DEVICES UTILIZED TO PREVENT FALLS:  Life alert? No  Use of a cane, walker or w/c? No  Grab bars in the bathroom? Yes  Shower chair or bench in shower? No  Elevated toilet seat or a handicapped toilet? No   TIMED UP AND GO:  Was the test performed? No .  Length of time to ambulate 10 feet: 0 sec.   Gait steady and fast without use of assistive device  Cognitive Function:        06/22/2022    9:24 AM  6CIT Screen  What Year? 0 points  What month? 0 points  What time? 0 points  Count back from 20 0 points  Months in reverse 0 points  Repeat phrase 0 points  Total Score 0 points    Immunizations Immunization History  Administered Date(s) Administered   Fluad Quad(high Dose 65+) 09/23/2021   Influenza Whole 01/27/2007   Influenza, Seasonal, Injecte, Preservative Fre 12/01/2012   Influenza,inj,Quad PF,6+ Mos 11/08/2014, 10/06/2015, 10/26/2016, 09/06/2017, 10/10/2019, 09/29/2020   Influenza-Unspecified 10/27/2018   Moderna SARS-COV2 Booster Vaccination 10/25/2020   PNEUMOCOCCAL CONJUGATE-20 06/22/2022   Pneumococcal Conjugate-13 08/18/2020   Pneumococcal Polysaccharide-23 06/28/2005   Td 02/25/2004   Tdap 06/13/2018    TDAP status: Up to date  Flu Vaccine status: Up to date  Pneumococcal vaccine status: received during today's visit   Covid-19 vaccine status: Completed vaccines  Qualifies for Shingles Vaccine? Yes   Zostavax completed No   Shingrix Completed?: No.    Education has been provided regarding the importance of this vaccine. Patient has been advised to call insurance company to determine out of pocket expense if they have not yet received this vaccine. Advised may also receive vaccine at local pharmacy or Health Dept. Verbalized acceptance and understanding.  Screening Tests Health Maintenance  Topic Date Due   COVID-19 Vaccine (1) 07/06/1956   Zoster Vaccines- Shingrix (1 of 2) Never done   INFLUENZA VACCINE   07/27/2022   HEMOGLOBIN A1C  09/22/2022   FOOT EXAM  09/23/2022  LIPID PANEL  09/23/2022   OPHTHALMOLOGY EXAM  11/09/2022   MAMMOGRAM  11/19/2023   COLONOSCOPY (Pts 45-63yr Insurance coverage will need to be confirmed)  04/06/2027   TETANUS/TDAP  06/13/2028   Pneumonia Vaccine 67 Years old  Completed   DEXA SCAN  Completed   Hepatitis C Screening  Completed   HPV VACCINES  Aged Out    Health Maintenance  Health Maintenance Due  Topic Date Due   COVID-19 Vaccine (1) 07/06/1956   Zoster Vaccines- Shingrix (1 of 2) Never done    Colorectal cancer screening: Type of screening: Colonoscopy. Completed  . Repeat every 10 years due 03/2027   Mammogram status: Completed  . Repeat every year  Bone Density status: Completed 05/17/2013. Results reflect: Bone density results: NORMAL. Repeat every   years.  Lung Cancer Screening: (Low Dose CT Chest recommended if Age 474-80years, 30 pack-year currently smoking OR have quit w/in 15years.) does not qualify.   Lung Cancer Screening Referral: N/A  Additional Screening:  Hepatitis C Screening: does qualify; Completed   Vision Screening: Recommended annual ophthalmology exams for early detection of glaucoma and other disorders of the eye. Is the patient up to date with their annual eye exam?  Yes  Who is the provider or what is the name of the office in which the patient attends annual eye exams? [facility] If pt is not established with a provider, would they like to be referred to a provider to establish care?  Has upcoming appt with Dr [facility] .   Dental Screening: Recommended annual dental exams for proper oral hygiene  Community Resource Referral / Chronic Care Management: CRR required this visit?  No   CCM required this visit?  No      Plan:     I have personally reviewed and noted the following in the patient's chart:   Medical and social history Use of alcohol, tobacco or illicit drugs  Current medications and  supplements including opioid prescriptions.  Functional ability and status Nutritional status Physical activity Advanced directives List of other physicians Hospitalizations, surgeries, and ER visits in previous 12 months Vitals Screenings to include cognitive, depression, and falls Referrals and appointments  In addition, I have reviewed and discussed with patient certain preventive protocols, quality metrics, and best practice recommendations. A written personalized care plan for preventive services as well as general preventive health recommendations were provided to patient.     GDespina [place]  06/22/2022   Nurse Notes: FACE TO FACE 231MINUTES  Ms. HHilyer, Thank you for taking time to come for your Medicare Wellness Visit. I appreciate your ongoing commitment to your health goals. Please review the following plan we discussed and let me know if I can assist you in the future.   These are the goals we discussed:   Goals      Blood Pressure < 140/90     BP Readings from Last 3 Encounters:  09/08/20 (!) 185/108  08/18/20 (!) 171/95  04/03/20 (!) 155/98     Not meeting blood pressure targets- medications changed today via telehealth and patient encouraged to take her blood pressure at home everyday and record     Have 3 meals a day     HEMOGLOBIN A1C < 7     Lab Results  Component Value Date   HGBA1C 6.7 (A) 08/18/2020     Meeting Hgb A1C  target       LDL CALC < 100  Lab Results  Component Value Date   CHOL 147 08/18/2020   HDL 76 08/18/2020   LDLCALC 60 08/18/2020   LDLDIRECT 183 (H) 03/01/2014   TRIG 53 08/18/2020   CHOLHDL 1.9 08/18/2020        Quit smoking / using tobacco        This is a list of the screening recommended for you and due dates:  Health Maintenance  Topic Date Due   COVID-19 Vaccine (1) 07/06/1956   Zoster (Shingles) Vaccine (1 of 2) Never done   Flu Shot  07/27/2022   Hemoglobin A1C  09/22/2022   Complete foot  exam   09/23/2022   Lipid (cholesterol) test  09/23/2022   Eye exam for diabetics  11/09/2022   Mammogram  11/19/2023   Colon Cancer Screening  04/06/2027   Tetanus Vaccine  06/13/2028   Pneumonia Vaccine  Completed   DEXA scan (bone density measurement)  Completed   Hepatitis C Screening: USPSTF Recommendation to screen - Ages 71-79 yo.  Completed   HPV Vaccine  Aged Out

## 2022-06-29 NOTE — Progress Notes (Signed)
Internal Medicine Clinic Attending  Case discussed with Dr. Allyson Sabal soon after the resident saw the patient.  I reviewed the AWV findings.  I agree with the assessment, diagnosis, and plan of care documented in the AWV note.

## 2022-06-29 NOTE — Progress Notes (Signed)
Internal Medicine Clinic Attending  Case discussed with Dr. Jinwala at the time of the visit.  We reviewed the resident's history and exam and pertinent patient test results.  I agree with the assessment, diagnosis, and plan of care documented in the resident's note.  

## 2022-07-03 ENCOUNTER — Other Ambulatory Visit: Payer: Self-pay | Admitting: Cardiovascular Disease

## 2022-07-05 NOTE — Telephone Encounter (Signed)
Rx request sent to pharmacy.  

## 2022-07-15 DIAGNOSIS — G894 Chronic pain syndrome: Secondary | ICD-10-CM | POA: Diagnosis not present

## 2022-07-15 DIAGNOSIS — M5416 Radiculopathy, lumbar region: Secondary | ICD-10-CM | POA: Diagnosis not present

## 2022-07-15 DIAGNOSIS — F17210 Nicotine dependence, cigarettes, uncomplicated: Secondary | ICD-10-CM | POA: Diagnosis not present

## 2022-07-15 DIAGNOSIS — Z79899 Other long term (current) drug therapy: Secondary | ICD-10-CM | POA: Diagnosis not present

## 2022-07-15 DIAGNOSIS — E119 Type 2 diabetes mellitus without complications: Secondary | ICD-10-CM | POA: Diagnosis not present

## 2022-07-15 DIAGNOSIS — I10 Essential (primary) hypertension: Secondary | ICD-10-CM | POA: Diagnosis not present

## 2022-07-15 DIAGNOSIS — M25512 Pain in left shoulder: Secondary | ICD-10-CM | POA: Diagnosis not present

## 2022-07-15 DIAGNOSIS — G8929 Other chronic pain: Secondary | ICD-10-CM | POA: Diagnosis not present

## 2022-07-15 DIAGNOSIS — Z9181 History of falling: Secondary | ICD-10-CM | POA: Diagnosis not present

## 2022-07-15 DIAGNOSIS — M25562 Pain in left knee: Secondary | ICD-10-CM | POA: Diagnosis not present

## 2022-07-15 DIAGNOSIS — M62830 Muscle spasm of back: Secondary | ICD-10-CM | POA: Diagnosis not present

## 2022-07-15 DIAGNOSIS — F17200 Nicotine dependence, unspecified, uncomplicated: Secondary | ICD-10-CM | POA: Diagnosis not present

## 2022-07-19 DIAGNOSIS — Z79899 Other long term (current) drug therapy: Secondary | ICD-10-CM | POA: Diagnosis not present

## 2022-08-10 DIAGNOSIS — E119 Type 2 diabetes mellitus without complications: Secondary | ICD-10-CM | POA: Diagnosis not present

## 2022-08-10 DIAGNOSIS — H3581 Retinal edema: Secondary | ICD-10-CM | POA: Diagnosis not present

## 2022-08-10 DIAGNOSIS — Z961 Presence of intraocular lens: Secondary | ICD-10-CM | POA: Diagnosis not present

## 2022-08-10 DIAGNOSIS — H401122 Primary open-angle glaucoma, left eye, moderate stage: Secondary | ICD-10-CM | POA: Diagnosis not present

## 2022-08-10 DIAGNOSIS — H53022 Refractive amblyopia, left eye: Secondary | ICD-10-CM | POA: Diagnosis not present

## 2022-08-10 DIAGNOSIS — H04123 Dry eye syndrome of bilateral lacrimal glands: Secondary | ICD-10-CM | POA: Diagnosis not present

## 2022-08-10 DIAGNOSIS — H401113 Primary open-angle glaucoma, right eye, severe stage: Secondary | ICD-10-CM | POA: Diagnosis not present

## 2022-08-10 LAB — HM DIABETES EYE EXAM

## 2022-08-16 DIAGNOSIS — E119 Type 2 diabetes mellitus without complications: Secondary | ICD-10-CM | POA: Diagnosis not present

## 2022-08-16 DIAGNOSIS — M62830 Muscle spasm of back: Secondary | ICD-10-CM | POA: Diagnosis not present

## 2022-08-16 DIAGNOSIS — M25512 Pain in left shoulder: Secondary | ICD-10-CM | POA: Diagnosis not present

## 2022-08-16 DIAGNOSIS — G8929 Other chronic pain: Secondary | ICD-10-CM | POA: Diagnosis not present

## 2022-08-16 DIAGNOSIS — F17200 Nicotine dependence, unspecified, uncomplicated: Secondary | ICD-10-CM | POA: Diagnosis not present

## 2022-08-16 DIAGNOSIS — Z79899 Other long term (current) drug therapy: Secondary | ICD-10-CM | POA: Diagnosis not present

## 2022-08-16 DIAGNOSIS — I10 Essential (primary) hypertension: Secondary | ICD-10-CM | POA: Diagnosis not present

## 2022-08-16 DIAGNOSIS — M5416 Radiculopathy, lumbar region: Secondary | ICD-10-CM | POA: Diagnosis not present

## 2022-08-16 DIAGNOSIS — Z9181 History of falling: Secondary | ICD-10-CM | POA: Diagnosis not present

## 2022-08-16 DIAGNOSIS — F17210 Nicotine dependence, cigarettes, uncomplicated: Secondary | ICD-10-CM | POA: Diagnosis not present

## 2022-08-16 DIAGNOSIS — G894 Chronic pain syndrome: Secondary | ICD-10-CM | POA: Diagnosis not present

## 2022-08-16 DIAGNOSIS — M25562 Pain in left knee: Secondary | ICD-10-CM | POA: Diagnosis not present

## 2022-08-18 DIAGNOSIS — Z79899 Other long term (current) drug therapy: Secondary | ICD-10-CM | POA: Diagnosis not present

## 2022-08-23 ENCOUNTER — Ambulatory Visit: Payer: Medicare Other | Admitting: Podiatry

## 2022-08-26 DIAGNOSIS — H401113 Primary open-angle glaucoma, right eye, severe stage: Secondary | ICD-10-CM | POA: Diagnosis not present

## 2022-08-26 DIAGNOSIS — H3581 Retinal edema: Secondary | ICD-10-CM | POA: Diagnosis not present

## 2022-08-26 DIAGNOSIS — H401122 Primary open-angle glaucoma, left eye, moderate stage: Secondary | ICD-10-CM | POA: Diagnosis not present

## 2022-08-26 DIAGNOSIS — H43822 Vitreomacular adhesion, left eye: Secondary | ICD-10-CM | POA: Diagnosis not present

## 2022-08-28 LAB — GLUCOSE, POCT (MANUAL RESULT ENTRY): POC Glucose: 187 mg/dl — AB (ref 70–99)

## 2022-09-11 ENCOUNTER — Encounter: Payer: Self-pay | Admitting: Podiatry

## 2022-09-11 ENCOUNTER — Ambulatory Visit (INDEPENDENT_AMBULATORY_CARE_PROVIDER_SITE_OTHER): Payer: Medicare Other | Admitting: Podiatry

## 2022-09-11 DIAGNOSIS — B351 Tinea unguium: Secondary | ICD-10-CM | POA: Diagnosis not present

## 2022-09-11 DIAGNOSIS — M79675 Pain in left toe(s): Secondary | ICD-10-CM

## 2022-09-11 DIAGNOSIS — E1151 Type 2 diabetes mellitus with diabetic peripheral angiopathy without gangrene: Secondary | ICD-10-CM | POA: Diagnosis not present

## 2022-09-11 DIAGNOSIS — M79674 Pain in right toe(s): Secondary | ICD-10-CM | POA: Diagnosis not present

## 2022-09-11 DIAGNOSIS — L84 Corns and callosities: Secondary | ICD-10-CM | POA: Diagnosis not present

## 2022-09-13 ENCOUNTER — Encounter: Payer: Self-pay | Admitting: *Deleted

## 2022-09-13 NOTE — Progress Notes (Signed)
[facility] Quality Team Note  Name: Susan Holmes Date of Birth: 1956-11-26 MRN: [id number] Date: 09/13/2022  [facility] Quality Team has reviewed this patient's chart, please see recommendations below:  [facility] Quality Other; (Pt has open gap for KED measure.  Tried to call pt on both numbers listed in Epic but not in service.  Pt needs urine albumin creatinine ratio test and EGFR in order to close the gap,)

## 2022-09-16 DIAGNOSIS — G629 Polyneuropathy, unspecified: Secondary | ICD-10-CM | POA: Diagnosis not present

## 2022-09-16 DIAGNOSIS — M546 Pain in thoracic spine: Secondary | ICD-10-CM | POA: Diagnosis not present

## 2022-09-16 DIAGNOSIS — Z86718 Personal history of other venous thrombosis and embolism: Secondary | ICD-10-CM | POA: Diagnosis not present

## 2022-09-16 DIAGNOSIS — K5909 Other constipation: Secondary | ICD-10-CM | POA: Diagnosis not present

## 2022-09-16 DIAGNOSIS — M62830 Muscle spasm of back: Secondary | ICD-10-CM | POA: Diagnosis not present

## 2022-09-16 DIAGNOSIS — M542 Cervicalgia: Secondary | ICD-10-CM | POA: Diagnosis not present

## 2022-09-16 DIAGNOSIS — M5416 Radiculopathy, lumbar region: Secondary | ICD-10-CM | POA: Diagnosis not present

## 2022-09-16 DIAGNOSIS — M25511 Pain in right shoulder: Secondary | ICD-10-CM | POA: Diagnosis not present

## 2022-09-16 DIAGNOSIS — G894 Chronic pain syndrome: Secondary | ICD-10-CM | POA: Diagnosis not present

## 2022-09-16 DIAGNOSIS — M11262 Other chondrocalcinosis, left knee: Secondary | ICD-10-CM | POA: Diagnosis not present

## 2022-09-16 DIAGNOSIS — G8929 Other chronic pain: Secondary | ICD-10-CM | POA: Diagnosis not present

## 2022-09-17 NOTE — Progress Notes (Signed)
  Subjective:  Patient ID: Susan Holmes, female    DOB: 05-10-1956,  MRN: [id number]  Susan Holmes presents to clinic today for at risk foot care. Pt has h/o NIDDM with PAD and callus(es) both feet and painful thick toenails that are difficult to trim. Painful toenails interfere with ambulation. Aggravating factors include wearing enclosed shoe gear. Pain is relieved with periodic professional debridement. Painful calluses are aggravated when weightbearing with and without shoegear. Pain is relieved with periodic professional debridement.  Last ABIs performed 03/18/2022 and last visit with Vascular Surgery team was 03/18/2022.  New problem(s): None.   PCP is Lottie Mussel, MD , and last visit was  June 22, 2022.  Allergies  Allergen Reactions   Ace Inhibitors Cough   Lipitor [Atorvastatin] Other (See Comments)    MYALGIA    Chantix [Varenicline] Nausea And Vomiting    Review of Systems: Negative except as noted in the HPI.  Objective: No changes noted in today's physical examination. Susan Holmes is a pleasant 66 y.o. female in NAD. AAO x 3.  Vascular Examination: CFT <3 seconds b/l. DP pulses faintly palpable b/l. PT pulses nonpalpable b/l. Digital hair absent. Skin temperature gradient warm to warm b/l. No pain with calf compression. No ischemia or gangrene. No cyanosis or clubbing noted b/l.    Neurological Examination: Sensation grossly intact b/l with 10 gram monofilament. Vibratory sensation intact b/l.   Dermatological Examination: Toenails 2-5 b/l thick, discolored, elongated with subungual debris and pain on dorsal palpation.  Pedal skin thin, shiny and atrophic b/l LE. No open wounds b/l LE. No interdigital macerations noted b/l LE. Anonychia noted bilateral great toes. Nailbed(s) epithelialized.  Hyperkeratotic lesion(s) plantar sulcus area 1st webspace, medial IPJ of left great toe, submet head 4 left foot, and submet head 5 right foot.  No erythema, no edema, no  drainage, no fluctuance.  Musculoskeletal Examination: Muscle strength 5/5 to b/l LE. Hammertoe deformity noted 2-5 b/l.  Radiographs: None  Last HgA1c:      Latest Ref Rng & Units 06/22/2022    9:14 AM 09/23/2021    2:30 PM  Hemoglobin A1C  Hemoglobin-A1c 4.0 - 5.6 % 6.9  12.0    Assessment/Plan: 1. Pain due to onychomycosis of toenails of both feet   2. Callus   3. Diabetes mellitus type 2 with peripheral artery disease ([place])   -Examined patient. -Continue foot and shoe inspections daily. Monitor blood glucose per PCP/Endocrinologist's recommendations. -Patient to continue soft, supportive shoe gear daily. -Mycotic toenails 2-5 bilaterally were debrided in length and girth with sterile nail nippers and dremel without iatrogenic bleeding. -Callus(es) plantar sulcus 1st webspace right foot, medial IPJ of left great toe, submet head 4 left foot, and submet head 5 right foot pared utilizing sterile scalpel blade without complication or incident. Total number debrided =4. -Patient/POA to call should there be question/concern in the interim.   Return in about 3 months (around 12/11/2022).  [name], DPM

## 2022-09-28 ENCOUNTER — Telehealth (HOSPITAL_BASED_OUTPATIENT_CLINIC_OR_DEPARTMENT_OTHER): Payer: Self-pay | Admitting: Cardiovascular Disease

## 2022-09-28 ENCOUNTER — Other Ambulatory Visit: Payer: Self-pay | Admitting: Cardiovascular Disease

## 2022-09-28 NOTE — Telephone Encounter (Signed)
Called to schedule overdue follow up with Dr. Oval Linsey / Laurann Montana, NP (to refill medications)---- No answer and voice mail has not been set up

## 2022-09-28 NOTE — Telephone Encounter (Signed)
Pt requesting appt at [facility] location due to proximity. Scheduled appt for 10/20 at 1:05 P.M. with Nicholes Rough, P.A.

## 2022-09-28 NOTE — Telephone Encounter (Signed)
Pt of Dr. Oval Linsey, last seen 12/2020. Pt was supposed to follow-up in 1 year. Please call pt to schedule appt for refills. Thank you!

## 2022-10-01 NOTE — Telephone Encounter (Signed)
Called to discuss scheduling the overdue follow up with Dr. Oval Linsey- (to refill medications)--no answer and vm not set up

## 2022-10-07 ENCOUNTER — Telehealth: Payer: Self-pay | Admitting: Podiatry

## 2022-10-07 ENCOUNTER — Encounter: Payer: Self-pay | Admitting: Podiatry

## 2022-10-07 NOTE — Telephone Encounter (Signed)
Reached out to pt to r/s appt for [place] on 12/19, no vm setup available will send out a letter.

## 2022-10-08 NOTE — Telephone Encounter (Signed)
Patient is scheduled to see Nicholes Rough, PA on Friday 10/15/22

## 2022-10-13 NOTE — Progress Notes (Signed)
Office Visit    Patient Name: Susan Holmes Date of Encounter: 10/15/2022  PCP:  Lottie Mussel, MD   [facility]  Cardiologist:  Skeet Latch, MD  Advanced Practice Provider:  No care team member to display Electrophysiologist:  None   HPI    Susan Holmes is a 66 y.o. female with past medical history significant for diabetes mellitus type 2, hypertension, hyperlipidemia, nonobstructive CAD, carotid stenosis, stroke, and tobacco abuse presents today for follow-up visit.  Patient was initially seen 08/13/2016 with chest pain.  She was referred for [facility] which revealed LVEF 52% it was negative for ischemia.  She also noted to have a history of ischemic stroke and had not undergone TEE.  She had a TEE on 08/17/2016 that was negative for thrombus and did not reveal an ASD or PFO.  Carotid Doppler showed moderate L ECA stenosis and mild L ICA stenosis.  She was started on aspirin atorvastatin 80 mg but this was stopped due to myalgias.  She did report frequent palpitations and had a 30-day monitor placed which showed occasional PACs and PVCs.  She previously had an echo 10/15 that revealed LVEF 40 to 45% with diffuse hypokinesis and trace MR. She had a nuclear stress test that showed LVEF 38% with fixed apical defect.  She had an LHC at that time without obstructive coronary disease and her EF 1 left ventriculogram was 55% and LVEDP was 10 mmHg.  She was referred to pulmonology.  PFTs did not show clear obstruction.  She was started on Symbicort 04/2017.  She reported chest pain on 09/2018 and had a Lexiscan Myoview that revealed LVEF 57% with no ischemia.  She was last seen by Dr. Oval Linsey 01/06/2021.  At that time she was feeling well.  Her blood pressure was well controlled.  She had not been getting much exercise.  She has a membership to [employer] but had not been going.  She was eager to start back exercising.  She denied exertional shortness of breath or  chest pain.  She been struggling with her blood sugars which are running high.  She has been smoking.  She was interesting in using a nicotine replacement gum for cessation.  Today, she tells me she is still smoking.  She did try the gum but it did not work out like she had hoped.  She does still have some shortness of breath which has been stable over the past few months.  She denies chest pain.  She is having some leg pain which has been stable.  Her last ABIs were back in March with Dr. Doren Custard.  She will likely need these repeated in the spring.  No lower extremity edema on exam today.  EKG is normal.  Blood pressure initially elevated but on retake 130/80 encouraged to continue current medications.  Occasionally she notices some skipped beats maybe twice a week but these are not particularly bothersome.  She does have a history of PVCs.  Reports no chest pain, pressure, or tightness. No edema, orthopnea, PND. Reports no palpitations.    Past Medical History    Past Medical History:  Diagnosis Date   Anxiety    Arthritis    Callus of foot 09/19/2018   Chest pain 09/26/2018   Claudication ([place]) 09/14/2019   Congenital blindness    Left  eye   Coronary artery disease    per cardiac cath 2015-- mild disease involving LAD and branches ,  LCFx and branches, and mild disease pRCA   Cough 07/08/2016   Depression    Diabetes mellitus, type II ([place])    followed by pcp   Glaucoma    Glaucoma, both eyes    Hepatitis B infection pt unsure   resolved   History of kidney stones    right stone   History of transient ischemic attack (TIA) 2017   per pt no residuals   History of vitamin D deficiency 09/06/2017   HTN (hypertension)    followed by pcp   Hyperlipidemia    PAD (peripheral artery disease) (HCC)    Paronychia of great toe, left 06/21/2019   Poor dental hygiene    Stroke ([place])    Tobacco abuse    .5 PPD smoker   Uncontrolled type II diabetes mellitus 06/12/2007   Qualifier: Diagnosis  of  By: [place] MSN, FNP-C, Collie Siad     Past Surgical History:  Procedure Laterality Date   ABDOMINAL AORTOGRAM W/LOWER EXTREMITY N/A 09/14/2019   Procedure: ABDOMINAL AORTOGRAM W/LOWER EXTREMITY;  Surgeon: Elam Dutch, MD;  Location: [facility];  Service: Cardiovascular;  Laterality: N/A;   ABDOMINAL HYSTERECTOMY  1980s   unsure if ovaries were taken   BYPASS GRAFT Left 10/22/2019    Left femoral to above-knee popliteal bypass using reversed ipsilateral greater saphenous vein   colonoscopy     FEMORAL-POPLITEAL BYPASS GRAFT Left 10/22/2019   Procedure: LEFT FEMORAL-ABOVE KNEE POPLITEAL  BYPASS GRAFT with REVERSED GREATER SAPHENOUS VEIN;  Surgeon: Elam Dutch, MD;  Location: [facility];  Service: Vascular;  Laterality: Left;   GLAUCOMA SURGERY Right 2017   IR URETERAL STENT RIGHT NEW ACCESS W/O SEP NEPHROSTOMY CATH  10/03/2018   LEFT HEART CATHETERIZATION WITH CORONARY ANGIOGRAM N/A 10/09/2014   Procedure: LEFT HEART CATHETERIZATION WITH CORONARY ANGIOGRAM;  Surgeon: Troy Sine, MD;  Location: [facility] LAB;  Service: Cardiovascular;  Laterality: N/A;   NEPHROLITHOTOMY Right 10/03/2018   Procedure: RIGHT NEPHROLITHOTOMY PERCUTANEOUS FIRST STAGE;  Surgeon: Irine Seal, MD;  Location: [facility];  Service: Urology;  Laterality: Right;   PERIPHERAL VASCULAR INTERVENTION Left 09/14/2019   Procedure: PERIPHERAL VASCULAR INTERVENTION;  Surgeon: Elam Dutch, MD;  Location: [place] [facility];  Service: Cardiovascular;  Laterality: Left;   left external iliac stent    TEE WITHOUT CARDIOVERSION N/A 08/17/2016   Procedure: TRANSESOPHAGEAL ECHOCARDIOGRAM (TEE);  Surgeon: Sueanne Margarita, MD;  Location: [facility];  Service: Cardiovascular;  Laterality: N/A;    Allergies  Allergies  Allergen Reactions   Ace Inhibitors Cough   Lipitor [Atorvastatin] Other (See Comments)    MYALGIA    Chantix [Varenicline] Nausea And Vomiting   EKGs/Labs/Other Studies Reviewed:   The following  studies were reviewed today:  ABIs 03/18/2022  Summary:  Right: Resting right ankle-brachial index is within normal range. The  right toe-brachial index is normal.   Left: Resting left ankle-brachial index is within normal range. The left  toe-brachial index is normal.   EKG:  EKG is  ordered today.  The ekg ordered today demonstrates normal sinus rhythm, rate 72 bpm  Recent Labs: No results found for requested labs within last 365 days.  Recent Lipid Panel    Component Value Date/Time   CHOL 138 09/23/2021 1411   TRIG 96 09/23/2021 1411   HDL 64 09/23/2021 1411   CHOLHDL 2.2 09/23/2021 1411   CHOLHDL 3.4 04/26/2017 1403   VLDL 31 (H) 04/26/2017 1403   LDLCALC 56 09/23/2021 1411  LDLDIRECT 183 (H) 03/01/2014 0500    Home Medications   Current Meds  Medication Sig   amLODipine (NORVASC) 10 MG tablet Take 1 tablet (10 mg total) by mouth daily.   aspirin EC 81 MG tablet Take 1 tablet (81 mg total) by mouth daily.   baclofen (LIORESAL) 10 MG tablet Take 10 mg by mouth 3 (three) times daily as needed.   Blood Glucose Monitoring Suppl (ONETOUCH VERIO) w/Device KIT Use as directed   buprenorphine (BUTRANS) 10 MCG/HR PTWK 1 patch once a week.   busPIRone (BUSPAR) 10 MG tablet buspirone 10 mg tablet  TAKE 1 TABLET BY MOUTH THREE TIMES DAILY   celecoxib (CELEBREX) 50 MG capsule Take 50 mg by mouth 2 (two) times daily.   cetirizine (ZYRTEC) 10 MG tablet Take 10 mg by mouth daily as needed.   diclofenac Sodium (VOLTAREN) 1 % GEL Apply 4 g topically 3 (three) times daily as needed (pain).   dorzolamide-timolol (COSOPT) 22.3-6.8 MG/ML ophthalmic solution Place 1 drop into both eyes every evening.   Dulaglutide (TRULICITY) 3 IP/3.8SN SOPN Inject 3 mg as directed once a week.   Garlic (GARLIQUE PO) Take by mouth.   glucose blood (ONETOUCH VERIO) test strip Use as instructed   hydrOXYzine (ATARAX/VISTARIL) 25 MG tablet    insulin glargine (LANTUS SOLOSTAR) 100 UNIT/ML Solostar Pen  Inject 45 Units into the skin at bedtime.   insulin lispro (HUMALOG KWIKPEN) 100 UNIT/ML KwikPen Inject 10 Units into the skin 3 (three) times daily.   Insulin Pen Needle 32G X 4 MM MISC Use to inject insulin 4 times a day   lidocaine (LIDODERM) 5 % 1 Patch(s) Topical   LINZESS 290 MCG CAPS capsule Take 290 mcg by mouth daily as needed.   losartan (COZAAR) 100 MG tablet Take 1 tablet (100 mg total) by mouth daily.   lubiprostone (AMITIZA) 24 MCG capsule lubiprostone 24 mcg capsule  TAKE 1 CAPSULE BY MOUTH DAILY AS NEEDED   metFORMIN (GLUCOPHAGE XR) 500 MG 24 hr tablet Take 1 tablet (500 mg total) by mouth 2 (two) times daily with a meal.   mirtazapine (REMERON) 45 MG tablet Take 1 tablet (45 mg total) by mouth at bedtime.   OLANZapine (ZYPREXA) 2.5 MG tablet Take 2.5 mg by mouth at bedtime.   OneTouch Delica Lancets 05L MISC Use with one touch Delica lancing device and Accu chek Guideme meter and Accu Chek guide strips to check your blood sugar up to 3 times a day   oxyCODONE (ROXICODONE) 15 MG immediate release tablet Take 15 mg by mouth every other day as needed for pain.    pantoprazole (PROTONIX) 40 MG tablet Take 1 tablet by mouth daily.   prednisoLONE acetate (PRED FORTE) 1 % ophthalmic suspension Place 1 drop into the right eye daily.   ramelteon (ROZEREM) 8 MG tablet Take 8 mg by mouth at bedtime.   rosuvastatin (CRESTOR) 20 MG tablet TAKE 1 TABLET(20 MG) BY MOUTH AT BEDTIME   traZODone (DESYREL) 100 MG tablet Take 300 mg by mouth at bedtime.   venlafaxine XR (EFFEXOR-XR) 75 MG 24 hr capsule Take 75 mg by mouth every morning.     Review of Systems      All other systems reviewed and are otherwise negative except as noted above.  Physical Exam    VS:  BP (!) 144/80   Pulse 72   Ht _0  (1.702 m)   Wt 169 lb 6.4 oz (76.8 kg)   SpO2 94%  BMI 26.53 kg/m  , BMI Body mass index is 26.53 kg/m.  Wt Readings from Last 3 Encounters:  10/15/22 169 lb 6.4 oz (76.8 kg)  06/22/22  172 lb (78 kg)  06/22/22 172 lb (78 kg)     GEN: Well nourished, well developed, in no acute distress. HEENT: normal. Neck: Supple, no JVD, carotid bruits, or masses. Cardiac: RRR, no murmurs, rubs, or gallops. No clubbing, cyanosis, edema.  Radials/PT 2+ and equal bilaterally.  Respiratory:  Respirations regular and unlabored, clear to auscultation bilaterally. GI: Soft, nontender, nondistended. MS: No deformity or atrophy. Skin: Warm and dry, no rash. Neuro:  Strength and sensation are intact. Psych: Normal affect.  Assessment & Plan    Chest pain -no chest pains but some SOB which as been stable -continue current medication  PVCs -feels like heart is skipping, couple of times a week -none on EKG -If these become more frequent or more bothersome to her we could consider a monitor  Hypertension -today slightly elevated, repeat 130/80 -Continue Norvasc 10 mg daily  PAD -Please follow-up with Dr. Scot Dock.  ABIs due March 2024  Hyperlipidemia -Lipid panel and LFTs ordered today -No recent LDL  6. Lower extremity pain/cramping at night -We will check a CBC and BMP -Follow-up with primary as well  7. Provider change -Requesting a provider changes since [place] [facility] [place] [facility]    Disposition: Follow up 3 months with Skeet Latch, MD or APP.  Signed, Elgie Collard, PA-C 10/15/2022, 1:19 PM [facility]

## 2022-10-15 ENCOUNTER — Telehealth: Payer: Self-pay | Admitting: *Deleted

## 2022-10-15 ENCOUNTER — Ambulatory Visit: Payer: Medicare Other | Attending: Physician Assistant | Admitting: Physician Assistant

## 2022-10-15 ENCOUNTER — Encounter: Payer: Self-pay | Admitting: Physician Assistant

## 2022-10-15 VITALS — BP 130/80 | HR 72 | Ht 67.0 in | Wt 169.4 lb

## 2022-10-15 DIAGNOSIS — M25512 Pain in left shoulder: Secondary | ICD-10-CM | POA: Diagnosis not present

## 2022-10-15 DIAGNOSIS — Z87898 Personal history of other specified conditions: Secondary | ICD-10-CM

## 2022-10-15 DIAGNOSIS — Z79899 Other long term (current) drug therapy: Secondary | ICD-10-CM | POA: Diagnosis not present

## 2022-10-15 DIAGNOSIS — F17210 Nicotine dependence, cigarettes, uncomplicated: Secondary | ICD-10-CM | POA: Diagnosis not present

## 2022-10-15 DIAGNOSIS — E785 Hyperlipidemia, unspecified: Secondary | ICD-10-CM | POA: Diagnosis not present

## 2022-10-15 DIAGNOSIS — M5416 Radiculopathy, lumbar region: Secondary | ICD-10-CM | POA: Diagnosis not present

## 2022-10-15 DIAGNOSIS — I493 Ventricular premature depolarization: Secondary | ICD-10-CM

## 2022-10-15 DIAGNOSIS — E119 Type 2 diabetes mellitus without complications: Secondary | ICD-10-CM | POA: Diagnosis not present

## 2022-10-15 DIAGNOSIS — I10 Essential (primary) hypertension: Secondary | ICD-10-CM

## 2022-10-15 DIAGNOSIS — F17200 Nicotine dependence, unspecified, uncomplicated: Secondary | ICD-10-CM | POA: Diagnosis not present

## 2022-10-15 DIAGNOSIS — M25551 Pain in right hip: Secondary | ICD-10-CM | POA: Diagnosis not present

## 2022-10-15 DIAGNOSIS — M62830 Muscle spasm of back: Secondary | ICD-10-CM | POA: Diagnosis not present

## 2022-10-15 DIAGNOSIS — I739 Peripheral vascular disease, unspecified: Secondary | ICD-10-CM | POA: Diagnosis not present

## 2022-10-15 DIAGNOSIS — G8929 Other chronic pain: Secondary | ICD-10-CM | POA: Diagnosis not present

## 2022-10-15 DIAGNOSIS — G894 Chronic pain syndrome: Secondary | ICD-10-CM | POA: Diagnosis not present

## 2022-10-15 DIAGNOSIS — Z9181 History of falling: Secondary | ICD-10-CM | POA: Diagnosis not present

## 2022-10-15 DIAGNOSIS — M79606 Pain in leg, unspecified: Secondary | ICD-10-CM | POA: Diagnosis not present

## 2022-10-15 NOTE — Patient Instructions (Addendum)
Medication Instructions:  Your physician recommends that you continue on your current medications as directed. Please refer to the Current Medication list given to you today.  *If you need a refill on your cardiac medications before your next appointment, please call your pharmacy*   Lab Work: CBC and BMP today Fasting lipid panel on Tuesday 10/19/2022, you can come anytime between 7:15 AM and 4:30 PM If you have labs (blood work) drawn today and your tests are completely normal, you will receive your results only by: [place] (if you have MyChart) OR A paper copy in the mail If you have any lab test that is abnormal or we need to change your treatment, we will call you to review the results.   Follow-Up: At [facility], you and your health needs are our priority.  As part of our continuing mission to provide you with exceptional heart care, we have created designated Provider Care Teams.  These Care Teams include your primary Cardiologist (physician) and Advanced Practice Providers (APPs -  Physician Assistants and Nurse Practitioners) who all work together to provide you with the care you need, when you need it.  We recommend signing up for the patient portal called "MyChart".  Sign up information is provided on this After Visit Summary.  MyChart is used to connect with patients for Virtual Visits (Telemedicine).  Patients are able to view lab/test results, encounter notes, upcoming appointments, etc.  Non-urgent messages can be sent to your provider as well.   To learn more about what you can do with MyChart, go to [email or web address].    Your next appointment:   3 month(s)  The format for your next appointment:   In Person  You will receive a call to schedule once we receive confirmation on provider switch.   Steps to Quit Smoking Smoking tobacco is the leading cause of preventable death. It can affect almost every organ in the body. Smoking puts you and people  around you at risk for many serious, long-lasting (chronic) diseases. Quitting smoking can be hard, but it is one of the best things that you can do for your health. It is never too late to quit. Do not give up if you cannot quit the first time. Some people need to try many times to quit. Do your best to stick to your quit plan, and talk with your doctor if you have any questions or concerns. How do I get ready to quit? Pick a date to quit. Set a date within the next 2 weeks to give you time to prepare. Write down the reasons why you are quitting. Keep this list in places where you will see it often. Tell your family, friends, and co-workers that you are quitting. Their support is important. Talk with your doctor about the choices that may help you quit. Find out if your health insurance will pay for these treatments. Know the people, places, things, and activities that make you want to smoke (triggers). Avoid them. What first steps can I take to quit smoking? Throw away all cigarettes at home, at work, and in your car. Throw away the things that you use when you smoke, such as ashtrays and lighters. Clean your car. Empty the ashtray. Clean your home, including curtains and carpets. What can I do to help me quit smoking? Talk with your doctor about taking medicines and seeing a counselor. You are more likely to succeed when you do both. If you are pregnant or breastfeeding: Talk  with your doctor about counseling or other ways to quit smoking. Do not take medicine to help you quit smoking unless your doctor tells you to. Quit right away Quit smoking completely, instead of slowly cutting back on how much you smoke over a period of time. Stopping smoking right away may be more successful than slowly quitting. Go to counseling. In-person is best if this is an option. You are more likely to quit if you go to counseling sessions regularly. Take medicine You may take medicines to help you quit. Some  medicines need a prescription, and some you can buy over-the-counter. Some medicines may contain a drug called nicotine to replace the nicotine in cigarettes. Medicines may: Help you stop having the desire to smoke (cravings). Help to stop the problems that come when you stop smoking (withdrawal symptoms). Your doctor may ask you to use: Nicotine patches, gum, or lozenges. Nicotine inhalers or sprays. Non-nicotine medicine that you take by mouth. Find resources Find resources and other ways to help you quit smoking and remain smoke-free after you quit. They include: Online chats with a Social worker. Phone quitlines. Printed Furniture conservator/restorer. Support groups or group counseling. Text messaging programs. Mobile phone apps. Use apps on your mobile phone or tablet that can help you stick to your quit plan. Examples of free services include Quit Guide from the CDC and smokefree.gov  What can I do to make it easier to quit?  Talk to your family and friends. Ask them to support and encourage you. Call a phone quitline, such as 1-800-QUIT-NOW, reach out to support groups, or work with a Social worker. Ask people who smoke to not smoke around you. Avoid places that make you want to smoke, such as: Bars. Parties. Smoke-break areas at work. Spend time with people who do not smoke. Lower the stress in your life. Stress can make you want to smoke. Try these things to lower stress: Getting regular exercise. Doing deep-breathing exercises. Doing yoga. Meditating. What benefits will I see if I quit smoking? Over time, you may have: A better sense of smell and taste. Less coughing and sore throat. A slower heart rate. Lower blood pressure. Clearer skin. Better breathing. Fewer sick days. Summary Quitting smoking can be hard, but it is one of the best things that you can do for your health. Do not give up if you cannot quit the first time. Some people need to try many times to quit. When you  decide to quit smoking, make a plan to help you succeed. Quit smoking right away, not slowly over a period of time. When you start quitting, get help and support to keep you smoke-free. This information is not intended to replace advice given to you by your health care provider. Make sure you discuss any questions you have with your health care provider. Document Revised: 12/04/2021 Document Reviewed: 12/04/2021 Elsevier Patient Education  [place]

## 2022-10-15 NOTE — Telephone Encounter (Signed)
Patient saw Nicholes Rough, PA-C and she would like to switch providers due to the [facility] location being closer for her. Please advise.  Thanks, Susan Holmes

## 2022-10-16 LAB — BASIC METABOLIC PANEL
BUN/Creatinine Ratio: 7 — ABNORMAL LOW (ref 12–28)
BUN: 6 mg/dL — ABNORMAL LOW (ref 8–27)
CO2: 23 mmol/L (ref 20–29)
Calcium: 9.6 mg/dL (ref 8.7–10.3)
Chloride: 105 mmol/L (ref 96–106)
Creatinine, Ser: 0.92 mg/dL (ref 0.57–1.00)
Glucose: 90 mg/dL (ref 70–99)
Potassium: 4.7 mmol/L (ref 3.5–5.2)
Sodium: 141 mmol/L (ref 134–144)
eGFR: 68 mL/min/{1.73_m2} (ref >59)

## 2022-10-16 LAB — CBC
Hematocrit: 37.6 % (ref 34.0–46.6)
Hemoglobin: 11.2 g/dL (ref 11.1–15.9)
MCH: 22 pg — ABNORMAL LOW (ref 26.6–33.0)
MCHC: 29.8 g/dL — ABNORMAL LOW (ref 31.5–35.7)
MCV: 74 fL — ABNORMAL LOW (ref 79–97)
Platelets: 277 10*3/uL (ref 150–450)
RBC: 5.1 x10E6/uL (ref 3.77–5.28)
RDW: 16.2 % — ABNORMAL HIGH (ref 11.7–15.4)
WBC: 7.1 10*3/uL (ref 3.4–10.8)

## 2022-10-19 ENCOUNTER — Ambulatory Visit: Payer: Medicare Other | Attending: Physician Assistant

## 2022-10-19 DIAGNOSIS — I493 Ventricular premature depolarization: Secondary | ICD-10-CM

## 2022-10-19 DIAGNOSIS — I739 Peripheral vascular disease, unspecified: Secondary | ICD-10-CM

## 2022-10-19 DIAGNOSIS — E785 Hyperlipidemia, unspecified: Secondary | ICD-10-CM | POA: Diagnosis not present

## 2022-10-19 DIAGNOSIS — I10 Essential (primary) hypertension: Secondary | ICD-10-CM

## 2022-10-19 DIAGNOSIS — Z87898 Personal history of other specified conditions: Secondary | ICD-10-CM

## 2022-10-19 DIAGNOSIS — Z79899 Other long term (current) drug therapy: Secondary | ICD-10-CM | POA: Diagnosis not present

## 2022-10-20 LAB — LIPID PANEL
Chol/HDL Ratio: 1.9 ratio (ref 0.0–4.4)
Cholesterol, Total: 122 mg/dL (ref 100–199)
HDL: 64 mg/dL (ref >39)
LDL Chol Calc (NIH): 42 mg/dL (ref 0–99)
Triglycerides: 85 mg/dL (ref 0–149)
VLDL Cholesterol Cal: 16 mg/dL (ref 5–40)

## 2022-10-27 NOTE — Telephone Encounter (Signed)
See 10/19/22 lab result note, have been trying to reach pt.

## 2022-10-29 NOTE — Telephone Encounter (Signed)
Spoke with patient and made her aware of okay to switch per both providers from Dr Oval Linsey to Dr Johney Frame. Scheduled her for a 3 month follow up per last office visit with Nicholes Rough, PA-C.

## 2022-11-01 ENCOUNTER — Other Ambulatory Visit: Payer: Self-pay

## 2022-11-01 NOTE — Telephone Encounter (Signed)
ACCU CHECK GUIDE TEST STRIPS 50

## 2022-11-02 NOTE — Telephone Encounter (Signed)
Can you send this to me as a refill request?

## 2022-11-03 ENCOUNTER — Other Ambulatory Visit: Payer: Self-pay | Admitting: Internal Medicine

## 2022-11-03 ENCOUNTER — Other Ambulatory Visit: Payer: Self-pay

## 2022-11-03 DIAGNOSIS — Z1231 Encounter for screening mammogram for malignant neoplasm of breast: Secondary | ICD-10-CM

## 2022-11-03 MED ORDER — ONETOUCH VERIO VI STRP: ORAL_STRIP | 12 refills | Status: DC

## 2022-11-03 NOTE — Telephone Encounter (Signed)
Pt calling back to f/u with her Refill Request for Test Strips.   glucose blood (ONETOUCH VERIO) test strip  glucose blood (ONETOUCH VERIO) test strip  [id number] - [place], [place] - [address]

## 2022-11-10 ENCOUNTER — Ambulatory Visit (INDEPENDENT_AMBULATORY_CARE_PROVIDER_SITE_OTHER): Payer: Medicare Other | Admitting: Internal Medicine

## 2022-11-10 ENCOUNTER — Other Ambulatory Visit: Payer: Self-pay

## 2022-11-10 ENCOUNTER — Encounter: Payer: Self-pay | Admitting: Internal Medicine

## 2022-11-10 VITALS — BP 135/79 | HR 67 | Temp 97.5°F | Ht 67.0 in | Wt 168.7 lb

## 2022-11-10 DIAGNOSIS — E119 Type 2 diabetes mellitus without complications: Secondary | ICD-10-CM | POA: Diagnosis not present

## 2022-11-10 DIAGNOSIS — F17210 Nicotine dependence, cigarettes, uncomplicated: Secondary | ICD-10-CM | POA: Diagnosis not present

## 2022-11-10 DIAGNOSIS — I10 Essential (primary) hypertension: Secondary | ICD-10-CM | POA: Diagnosis not present

## 2022-11-10 DIAGNOSIS — L299 Pruritus, unspecified: Secondary | ICD-10-CM | POA: Diagnosis not present

## 2022-11-10 DIAGNOSIS — Z794 Long term (current) use of insulin: Secondary | ICD-10-CM

## 2022-11-10 LAB — POCT GLYCOSYLATED HEMOGLOBIN (HGB A1C): Hemoglobin A1C: 6.1 % — AB (ref 4.0–5.6)

## 2022-11-10 LAB — GLUCOSE, CAPILLARY: Glucose-Capillary: 129 mg/dL — ABNORMAL HIGH (ref 70–99)

## 2022-11-10 MED ORDER — INSULIN LISPRO (1 UNIT DIAL) 100 UNIT/ML (KWIKPEN): 5.0000 [IU] | PEN_INJECTOR | Freq: Three times a day (TID) | SUBCUTANEOUS | 6 refills | Status: DC

## 2022-11-10 NOTE — Patient Instructions (Signed)
Thank you, Ms.Ireta L Kendrick for allowing [place] to provide your care today.  Diabetes You diabetes is well controlled! Great work. The goal is to keep A1c less than 7, yours was at 6.1 today. Please check your blood sugar in the morning before breakfast.  Continue Lantus 40 units Decrease Humalog to 5 units with meals Continue metformin 2 times daily Continue trulicity  I will call with results of urine test.  Itching hands Please use unscented lotion for your hands. You can use aquaphor or vasaline to help keep skin moist.   I have ordered the following labs for you:   Lab Orders         Microalbumin / Creatinine Urine Ratio         Glucose, capillary         POC Hbg A1C       I have ordered the following medication/changed the following medications:   Stop the following medications: Medications Discontinued During This Encounter  Medication Reason   insulin lispro (HUMALOG KWIKPEN) 100 UNIT/ML KwikPen Reorder     Start the following medications: Meds ordered this encounter  Medications   insulin lispro (HUMALOG KWIKPEN) 100 UNIT/ML KwikPen    Sig: Inject 5 Units into the skin 3 (three) times daily.    Dispense:  15 mL    Refill:  6     Follow up:  3 months    We look forward to seeing you next time. Please call our clinic at [phone] if you have any questions or concerns. The best time to call is Monday-Friday from 9am-4pm, but there is someone available 24/7. If after hours or the weekend, call the main hospital number and ask for the Internal Medicine Resident On-Call. If you need medication refills, please notify your pharmacy one week in advance and they will send [place] a request.   Thank you for trusting me with your care. Wishing you the best!   Christiana Fuchs, [place]

## 2022-11-10 NOTE — Progress Notes (Unsigned)
Subjective:  CC: follow-up on diabetes  HPI:  [name] is a 66 y.o. female with a past medical history stated below and presents today for diabetes follow-up.  Marland Kitchen Please see problem based assessment and plan for additional details.  Past Medical History:  Diagnosis Date   Anxiety    Arthritis    Callus of foot 09/19/2018   Chest pain 09/26/2018   Claudication ([place]) 09/14/2019   Congenital blindness    Left  eye   Coronary artery disease    per cardiac cath 2015-- mild disease involving LAD and branches ,  LCFx and branches, and mild disease pRCA   Cough 07/08/2016   Depression    Diabetes mellitus, type II ([place])    followed by pcp   Glaucoma    Glaucoma, both eyes    Hepatitis B infection pt unsure   resolved   History of kidney stones    right stone   History of transient ischemic attack (TIA) 2017   per pt no residuals   History of vitamin D deficiency 09/06/2017   HTN (hypertension)    followed by pcp   Hyperlipidemia    PAD (peripheral artery disease) (HCC)    Paronychia of great toe, left 06/21/2019   Poor dental hygiene    Stroke ([place])    Tobacco abuse    .5 PPD smoker   Uncontrolled type II diabetes mellitus 06/12/2007   Qualifier: Diagnosis of  By: Drinkard MSN, FNP-C, Collie Siad      Current Outpatient Medications on File Prior to Visit  Medication Sig Dispense Refill   amLODipine (NORVASC) 10 MG tablet Take 1 tablet (10 mg total) by mouth daily. 90 tablet 3   aspirin EC 81 MG tablet Take 1 tablet (81 mg total) by mouth daily. 90 tablet 1   baclofen (LIORESAL) 10 MG tablet Take 10 mg by mouth 3 (three) times daily as needed.     Blood Glucose Monitoring Suppl (ONETOUCH VERIO) w/Device KIT Use as directed 1 kit 0   buprenorphine (BUTRANS) 10 MCG/HR PTWK 1 patch once a week.     busPIRone (BUSPAR) 10 MG tablet buspirone 10 mg tablet  TAKE 1 TABLET BY MOUTH THREE TIMES DAILY     celecoxib (CELEBREX) 50 MG capsule Take 50 mg by mouth 2 (two) times daily.      cetirizine (ZYRTEC) 10 MG tablet Take 10 mg by mouth daily as needed.     diclofenac Sodium (VOLTAREN) 1 % GEL Apply 4 g topically 3 (three) times daily as needed (pain). 150 g 2   dorzolamide-timolol (COSOPT) 22.3-6.8 MG/ML ophthalmic solution Place 1 drop into both eyes every evening. 10 mL 1   Dulaglutide (TRULICITY) 3 MI/6.8EH SOPN Inject 3 mg as directed once a week. 2 mL 5   Garlic (GARLIQUE PO) Take by mouth.     glucose blood (ONETOUCH VERIO) test strip Use as instructed 100 each 12   hydrOXYzine (ATARAX/VISTARIL) 25 MG tablet      insulin glargine (LANTUS SOLOSTAR) 100 UNIT/ML Solostar Pen Inject 45 Units into the skin at bedtime. 15 mL 8   Insulin Pen Needle 32G X 4 MM MISC Use to inject insulin 4 times a day 360 each 3   lidocaine (LIDODERM) 5 % 1 Patch(s) Topical     LINZESS 290 MCG CAPS capsule Take 290 mcg by mouth daily as needed.     losartan (COZAAR) 100 MG tablet Take 1 tablet (100 mg total) by mouth daily.  90 tablet 3   lubiprostone (AMITIZA) 24 MCG capsule lubiprostone 24 mcg capsule  TAKE 1 CAPSULE BY MOUTH DAILY AS NEEDED     metFORMIN (GLUCOPHAGE XR) 500 MG 24 hr tablet Take 1 tablet (500 mg total) by mouth 2 (two) times daily with a meal. 60 tablet 2   mirtazapine (REMERON) 45 MG tablet Take 1 tablet (45 mg total) by mouth at bedtime. 30 tablet 1   NARCAN 4 MG/0.1ML LIQD nasal spray kit Place 1 spray into the nose once. (Patient not taking: Reported on 10/15/2022)     OLANZapine (ZYPREXA) 2.5 MG tablet Take 2.5 mg by mouth at bedtime.     OneTouch Delica Lancets 16X MISC Use with one touch Delica lancing device and Accu chek Guideme meter and Accu Chek guide strips to check your blood sugar up to 3 times a day 100 each 11   oxyCODONE (ROXICODONE) 15 MG immediate release tablet Take 15 mg by mouth every other day as needed for pain.      pantoprazole (PROTONIX) 40 MG tablet Take 1 tablet by mouth daily.     prednisoLONE acetate (PRED FORTE) 1 % ophthalmic suspension  Place 1 drop into the right eye daily. 5 mL 1   ramelteon (ROZEREM) 8 MG tablet Take 8 mg by mouth at bedtime.     rosuvastatin (CRESTOR) 20 MG tablet TAKE 1 TABLET(20 MG) BY MOUTH AT BEDTIME 90 tablet 0   traZODone (DESYREL) 100 MG tablet Take 300 mg by mouth at bedtime.     venlafaxine XR (EFFEXOR-XR) 75 MG 24 hr capsule Take 75 mg by mouth every morning.     No current facility-administered medications on file prior to visit.    Family History  Problem Relation Age of Onset   Heart disease Mother 43   Hypertension Mother    CVA Mother    Heart disease Father 76   CVA Sister    Heart disease Sister    Aneurysm Sister    CVA Brother    Aneurysm Brother    CVA Maternal Grandmother    CVA Maternal Grandfather    Hypertension Brother    Colon cancer Neg Hx    Esophageal cancer Neg Hx    Stomach cancer Neg Hx     Social History   Socioeconomic History   Marital status: Divorced    Spouse name: Not on file   Number of children: 1   Years of education: 12   Highest education level: Not on file  Occupational History   Occupation: Disabled  Tobacco Use   Smoking status: Every Day    Packs/day: 0.50    Years: 39.00    Total pack years: 19.50    Types: Cigarettes    Start date: 1981   Smokeless tobacco: Current    Types: Chew   Tobacco comments:    6 cigs per day  Vaping Use   Vaping Use: Never used  Substance and Sexual Activity   Alcohol use: No    Alcohol/week: 0.0 standard drinks of alcohol   Drug use: No   Sexual activity: Not Currently    Birth control/protection: Surgical  Other Topics Concern   Not on file  Social History Narrative   Current Social History 08/18/2020        Patient lives alone in a ground floor apartment which is 1 story. There are 13 steps with handrail up to the entrance the patient uses.       Patient's method  of transportation is [facility] transportation.      The highest level of education was 2 years college.      The patient  currently disabled.      Identified important Relationships are "My daughter, brothers, grands and great-grands."       Pets : chihuahua Chief Executive Officer)       Interests / Fun: "I don't"       Current Stressors: "Nothing"       Religious / Personal Beliefs: '[employer]ist"       Other: "I am a very loving person"      L. [place]atte, BSN, RN-BC        Social Determinants of Health   Financial Resource Strain: Low Risk  (11/10/2022)   Overall Financial Resource Strain (CARDIA)    Difficulty of Paying Living Expenses: Not hard at all  Food Insecurity: No Food Insecurity (11/10/2022)   Hunger Vital Sign    Worried About Running Out of Food in the Last Year: Never true    Ran Out of Food in the Last Year: Never true  Transportation Needs: No Transportation Needs (11/10/2022)   PRAPARE - Hydrologist (Medical): No    Lack of Transportation (Non-Medical): No  Physical Activity: Inactive (11/10/2022)   Exercise Vital Sign    Days of Exercise per Week: 0 days    Minutes of Exercise per Session: 0 min  Stress: No Stress Concern Present (11/10/2022)   [place]    Feeling of Stress : Only a little  Social Connections: Moderately Integrated (11/10/2022)   Social Connection and Isolation Panel [NHANES]    Frequency of Communication with Friends and Family: More than three times a week    Frequency of Social Gatherings with Friends and Family: Once a week    Attends Religious Services: More than 4 times per year    Active Member of [employer] or Organizations: No    Attends Music therapist: More than 4 times per year    Marital Status: Divorced  Human resources officer Violence: Not At Risk (11/10/2022)   Humiliation, Afraid, Rape, and Kick questionnaire    Fear of Current or Ex-Partner: No    Emotionally Abused: No    Physically Abused: No    Sexually Abused: No    Review of Systems: ROS negative  except for what is noted on the assessment and plan.  Objective:   Vitals:   11/10/22 1011 11/10/22 1023  BP: (!) 141/81 135/79  Pulse: 72 67  Temp: (!) 97.5 F (36.4 C)   TempSrc: Oral   SpO2: 99%   Weight: 168 lb 11.2 oz (76.5 kg)   Height: 5' 7" (1.702 m)     Physical Exam: Constitutional: well-appearing Cardiovascular: regular rate and rhythm, no m/r/g Pulmonary/Chest: normal work of breathing on room air, lungs clear to auscultation bilaterally Abdominal: soft, non-tender, non-distended MSK: normal bulk and tone Skin: warm and dry   Assessment & Plan:  Insulin dependent type 2 diabetes mellitus (HCC) HgbA1c improved from 6.9% to 6.1%. She has been taking Metformin 615 mg BID, trulicity 3 mg weekly, humalog 10 units TID, Lantus 40 (rather than 45) units qd. She checks blood sugars at night after eating. Glucometer readings at that time are in 200s. She previously used a CGM but did not like how often it alerted her and having something in her skin.  A: Diabetes is well controlled. Meter report shows  post prandial hyperglycemia. With her A1c being 6.1, I questions if she is having asymptomatic hypoglycemia that are balancing out high sugars. I think she would do better with titrating off of meal time insulin. P: Encouraged her to check blood sugars in the morning before breakfast. -decrease humalog from 10 units TID to 5 units TID -Continue Lantus 40 units Continue metformin 500 mg BID Continue trulicity 3 mg weekly -urine microalbumin pending -repeat A1c  2/24  Itching of both hands She is having itching of the backs of her hands for several weeks. She has not changed laundry detergent or hand soap. She has been using unscented hand lotion on the hands. On exam no excoriations present, no rash. Skin appears dry. P: No signs of contact dermatitis present. -encouraged continued use of lotion vs trial of emollient  -continue to monitor  Essential hypertension Blood  pressure elevated at 141/81 to 135/79. She does not check her blood pressure at home. Denies symptoms. She takes losartan 100 mg and amlodipine 10 mg. She is adherent with medications. A/P: BP is well controlled Continue losartan 100 mg and amlodipine 10 mg Repeat BMP due 11/24     Patient discussed with Dr. Angelia Mould    , D.O. [facility]  PGY-2 Pager: [phone]  Phone: [phone] Date 11/11/2022  Time 4:04 PM

## 2022-11-11 DIAGNOSIS — L299 Pruritus, unspecified: Secondary | ICD-10-CM | POA: Insufficient documentation

## 2022-11-11 DIAGNOSIS — I10 Essential (primary) hypertension: Secondary | ICD-10-CM | POA: Diagnosis not present

## 2022-11-11 DIAGNOSIS — G8929 Other chronic pain: Secondary | ICD-10-CM | POA: Diagnosis not present

## 2022-11-11 DIAGNOSIS — Z87891 Personal history of nicotine dependence: Secondary | ICD-10-CM | POA: Diagnosis not present

## 2022-11-11 DIAGNOSIS — M25512 Pain in left shoulder: Secondary | ICD-10-CM | POA: Diagnosis not present

## 2022-11-11 DIAGNOSIS — G894 Chronic pain syndrome: Secondary | ICD-10-CM | POA: Diagnosis not present

## 2022-11-11 DIAGNOSIS — Z79899 Other long term (current) drug therapy: Secondary | ICD-10-CM | POA: Diagnosis not present

## 2022-11-11 DIAGNOSIS — F17200 Nicotine dependence, unspecified, uncomplicated: Secondary | ICD-10-CM | POA: Diagnosis not present

## 2022-11-11 DIAGNOSIS — F17210 Nicotine dependence, cigarettes, uncomplicated: Secondary | ICD-10-CM | POA: Diagnosis not present

## 2022-11-11 DIAGNOSIS — M5416 Radiculopathy, lumbar region: Secondary | ICD-10-CM | POA: Diagnosis not present

## 2022-11-11 DIAGNOSIS — E119 Type 2 diabetes mellitus without complications: Secondary | ICD-10-CM | POA: Diagnosis not present

## 2022-11-11 DIAGNOSIS — M62830 Muscle spasm of back: Secondary | ICD-10-CM | POA: Diagnosis not present

## 2022-11-11 DIAGNOSIS — Z9181 History of falling: Secondary | ICD-10-CM | POA: Diagnosis not present

## 2022-11-11 LAB — MICROALBUMIN / CREATININE URINE RATIO
Creatinine, Urine: 167.5 mg/dL
Microalb/Creat Ratio: 6 mg/g creat (ref 0–29)
Microalbumin, Urine: 10.5 ug/mL

## 2022-11-11 NOTE — Assessment & Plan Note (Addendum)
HgbA1c improved from 6.9% to 6.1%. She has been taking Metformin 161 mg BID, trulicity 3 mg weekly, humalog 10 units TID, Lantus 40 (rather than 45) units qd. She checks blood sugars at night after eating. Glucometer readings at that time are in 200s. She previously used a CGM but did not like how often it alerted her and having something in her skin.  A: Diabetes is well controlled. Meter report shows post prandial hyperglycemia. With her A1c being 6.1, I questions if she is having asymptomatic hypoglycemia that are balancing out high sugars. I think she would do better with titrating off of meal time insulin. P: Encouraged her to check blood sugars in the morning before breakfast. -decrease humalog from 10 units TID to 5 units TID -Continue Lantus 40 units Continue metformin 500 mg BID Continue trulicity 3 mg weekly -urine microalbumin pending -repeat A1c  2/24

## 2022-11-11 NOTE — Assessment & Plan Note (Signed)
Blood pressure elevated at 141/81 to 135/79. She does not check her blood pressure at home. Denies symptoms. She takes losartan 100 mg and amlodipine 10 mg. She is adherent with medications. A/P: BP is well controlled Continue losartan 100 mg and amlodipine 10 mg Repeat BMP due 11/24

## 2022-11-11 NOTE — Assessment & Plan Note (Signed)
She is having itching of the backs of her hands for several weeks. She has not changed laundry detergent or hand soap. She has been using unscented hand lotion on the hands. On exam no excoriations present, no rash. Skin appears dry. P: No signs of contact dermatitis present. -encouraged continued use of lotion vs trial of emollient  -continue to monitor

## 2022-11-12 ENCOUNTER — Encounter: Payer: Self-pay | Admitting: Internal Medicine

## 2022-11-14 DIAGNOSIS — Z79899 Other long term (current) drug therapy: Secondary | ICD-10-CM | POA: Diagnosis not present

## 2022-11-15 NOTE — Progress Notes (Signed)
Internal Medicine Clinic Attending  Case discussed with the resident at the time of the visit.  We reviewed the resident's history and exam and pertinent patient test results.  I agree with the assessment, diagnosis, and plan of care documented in the resident's note.  

## 2022-11-23 ENCOUNTER — Telehealth: Payer: Self-pay

## 2022-11-23 DIAGNOSIS — I739 Peripheral vascular disease, unspecified: Secondary | ICD-10-CM

## 2022-11-23 DIAGNOSIS — M79605 Pain in left leg: Secondary | ICD-10-CM

## 2022-11-23 NOTE — Telephone Encounter (Signed)
Pt called stating that she was having LLE problems and requested an appt.  Reviewed pt's chart, returned call for clarification, two identifiers used. She states that for over a month she has sharp shooting pains in her LLE, rest pain that is worse at HS, dull aching pain during the day, and a knot on the top of her thigh which is sore. She denies any red, hot to the touch areas, any cold toes/feet, or any open wounds. Appts scheduled from recall for next available. Confirmed understanding.

## 2022-11-25 DIAGNOSIS — F5101 Primary insomnia: Secondary | ICD-10-CM | POA: Diagnosis not present

## 2022-11-28 NOTE — Progress Notes (Unsigned)
Established Previous Bypass   History of Present Illness   Susan Holmes is a 66 y.o. (10/11/1956) female who presents as a triage visit reporting sharp pains in her LLE and rest pain for over a month.  She has a history of left external iliac artery stenting in September 2020.  She then underwent left femoral to above-knee popliteal artery bypass with vein by Dr. Oneida Alar in October 2020.  She is being followed by Dr. Marlou Sa for lumbar spine disease.  She has previously had an MRI of the lumbar spine in January 2022 and recommendation was made for physical therapy.  She has a history of left leg pain at night and left knee pain suggestive of osteoarthritis.  Continues to smoke daily.    Current Outpatient Medications  Medication Sig Dispense Refill   amLODipine (NORVASC) 10 MG tablet Take 1 tablet (10 mg total) by mouth daily. 90 tablet 3   aspirin EC 81 MG tablet Take 1 tablet (81 mg total) by mouth daily. 90 tablet 1   baclofen (LIORESAL) 10 MG tablet Take 10 mg by mouth 3 (three) times daily as needed.     Blood Glucose Monitoring Suppl (ONETOUCH VERIO) w/Device KIT Use as directed 1 kit 0   buprenorphine (BUTRANS) 10 MCG/HR PTWK 1 patch once a week.     busPIRone (BUSPAR) 10 MG tablet buspirone 10 mg tablet  TAKE 1 TABLET BY MOUTH THREE TIMES DAILY     celecoxib (CELEBREX) 50 MG capsule Take 50 mg by mouth 2 (two) times daily.     cetirizine (ZYRTEC) 10 MG tablet Take 10 mg by mouth daily as needed.     diclofenac Sodium (VOLTAREN) 1 % GEL Apply 4 g topically 3 (three) times daily as needed (pain). 150 g 2   dorzolamide-timolol (COSOPT) 22.3-6.8 MG/ML ophthalmic solution Place 1 drop into both eyes every evening. 10 mL 1   Dulaglutide (TRULICITY) 3 WN/0.2VO SOPN Inject 3 mg as directed once a week. 2 mL 5   Garlic (GARLIQUE PO) Take by mouth.     glucose blood (ONETOUCH VERIO) test strip Use as instructed 100 each 12   hydrOXYzine (ATARAX/VISTARIL) 25 MG tablet      insulin  glargine (LANTUS SOLOSTAR) 100 UNIT/ML Solostar Pen Inject 45 Units into the skin at bedtime. 15 mL 8   insulin lispro (HUMALOG KWIKPEN) 100 UNIT/ML KwikPen Inject 5 Units into the skin 3 (three) times daily. 15 mL 6   Insulin Pen Needle 32G X 4 MM MISC Use to inject insulin 4 times a day 360 each 3   lidocaine (LIDODERM) 5 % 1 Patch(s) Topical     LINZESS 290 MCG CAPS capsule Take 290 mcg by mouth daily as needed.     losartan (COZAAR) 100 MG tablet Take 1 tablet (100 mg total) by mouth daily. 90 tablet 3   lubiprostone (AMITIZA) 24 MCG capsule lubiprostone 24 mcg capsule  TAKE 1 CAPSULE BY MOUTH DAILY AS NEEDED     metFORMIN (GLUCOPHAGE XR) 500 MG 24 hr tablet Take 1 tablet (500 mg total) by mouth 2 (two) times daily with a meal. 60 tablet 2   mirtazapine (REMERON) 45 MG tablet Take 1 tablet (45 mg total) by mouth at bedtime. 30 tablet 1   NARCAN 4 MG/0.1ML LIQD nasal spray kit Place 1 spray into the nose once. (Patient not taking: Reported on 10/15/2022)     OLANZapine (ZYPREXA) 2.5 MG tablet Take 2.5 mg by mouth at bedtime.  OneTouch Delica Lancets 02V MISC Use with one touch Delica lancing device and Accu chek Guideme meter and Accu Chek guide strips to check your blood sugar up to 3 times a day 100 each 11   oxyCODONE (ROXICODONE) 15 MG immediate release tablet Take 15 mg by mouth every other day as needed for pain.      pantoprazole (PROTONIX) 40 MG tablet Take 1 tablet by mouth daily.     prednisoLONE acetate (PRED FORTE) 1 % ophthalmic suspension Place 1 drop into the right eye daily. 5 mL 1   ramelteon (ROZEREM) 8 MG tablet Take 8 mg by mouth at bedtime.     rosuvastatin (CRESTOR) 20 MG tablet TAKE 1 TABLET(20 MG) BY MOUTH AT BEDTIME 90 tablet 0   traZODone (DESYREL) 100 MG tablet Take 300 mg by mouth at bedtime.     venlafaxine XR (EFFEXOR-XR) 75 MG 24 hr capsule Take 75 mg by mouth every morning.     No current facility-administered medications for this visit.    REVIEW OF  SYSTEMS (negative unless checked):   Cardiac:  _0  Chest pain or chest pressure? _1  Shortness of breath upon activity? _2  Shortness of breath when lying flat? _3  Irregular heart rhythm?  Vascular:  _4  Pain in calf, thigh, or hip brought on by walking? _5  Pain in feet at night that wakes you up from your sleep? _6  Blood clot in your veins? _7  Leg swelling?  Pulmonary:  _8  Oxygen at home? _9  Productive cough? _10  Wheezing?  Neurologic:  _11  Sudden weakness in arms or legs? _12  Sudden numbness in arms or legs? _13  Sudden onset of difficult speaking or slurred speech? _14  Temporary loss of vision in one eye? _15  Problems with dizziness?  Gastrointestinal:  _16  Blood in stool? _17  Vomited blood?  Genitourinary:  _18  Burning when urinating? _19  Blood in urine?  Psychiatric:  _20  Major depression  Hematologic:  _21  Bleeding problems? _22  Problems with blood clotting?  Dermatologic:  _23  Rashes or ulcers?  Constitutional:  _24  Fever or chills?  Ear/Nose/Throat:  _25  Change in hearing? _26  Nose bleeds? _27  Sore throat?  Musculoskeletal:  _28  Back pain? _29  Joint pain? _30  Muscle pain?   Physical Examination  ***There were no vitals filed for this visit. ***There is no height or weight on file to calculate BMI.  General:  WDWN in NAD; vital signs documented above Gait: Not observed HENT: WNL, normocephalic Pulmonary: normal non-labored breathing , without Rales, rhonchi,  wheezing Cardiac: regular rate and rhythm, no carotid bruits Abdomen: soft, NT, no masses Skin: without rashes Vascular Exam/Pulses:  Extremities: {With/Without:20273} ischemic changes, {With/Without:20273} Gangrene , {With/Without:20273} cellulitis; {With/Without:20273} open wounds;  Musculoskeletal: no muscle wasting or atrophy  Neurologic: A&O X 3;  No focal weakness or paresthesias are detected Psychiatric:  The pt has {Desc; normal/abnormal:11317::"Normal"} affect.  Non-Invasive Vascular Imaging ABI  (11/29/2022)  Bypass Duplex (11/29/2022)    Medical Decision Making   CINTHIA RODDEN is a 66 y.o. female who presents with LLE shooting pains x1 month  Based on the patient's vascular studies and examination, I have offered the patient: ***BLE ABI, ***bypass duplex, and ***aortoiliac duplex in *** months. I discussed in depth with the patient the nature of atherosclerosis, and emphasized the importance of maximal medical management including strict control of blood pressure, blood glucose, and lipid levels, obtaining regular exercise, and cessation of smoking.   The patient is aware that without maximal medical management the underlying atherosclerotic disease process will progress, limiting the benefit  of any interventions. I discussed in depth with the patient the need for long term surveillance to improve the primary assisted patency of his bypass.  The patient agrees to cooperate with such. The patient is currently {Statin:19197::"not on on statin as not medically indicated.","not on a statin due to reported allergy.","not on a statin. Patient will be started on Lipitor 10 mg PO daily, to be titrated and managed by their primary physician.","on a statin: Mevacor.","on a statin: Zocor.","on a statin: Lipitor.","on a statin: Pravachol.","on a statin: Crestor.","on a statin: ***."}  The patient is currently {Antiplatelet:19197::"not on an anti-platelet due to allergy.","not on an anti-platelet due to bleeding risks related to use of an anticoagulant.","not on an anti-platelet. Patient will be started on ASA 81 mg PO daily","on an anti-platelet: ASA.","on an anti-platelet: Plavix.","on an anti-platelet: ASA and Plavix.","on an anti-platelet: ***."} Thank you for allowing [place] to participate in this patient's care.   Dagoberto Ligas PA-C [facility] Office: [phone]  Clinic MD: ***

## 2022-11-29 ENCOUNTER — Ambulatory Visit (INDEPENDENT_AMBULATORY_CARE_PROVIDER_SITE_OTHER)
Admission: RE | Admit: 2022-11-29 | Discharge: 2022-11-29 | Disposition: A | Discharge status: Home / Self Care | Payer: Medicare Other | Source: Ambulatory Visit | Attending: Vascular Surgery | Admitting: Vascular Surgery

## 2022-11-29 ENCOUNTER — Ambulatory Visit (HOSPITAL_COMMUNITY)
Admission: RE | Admit: 2022-11-29 | Discharge: 2022-11-29 | Disposition: A | Discharge status: Home / Self Care | Payer: Medicare Other | Source: Ambulatory Visit | Attending: Vascular Surgery | Admitting: Vascular Surgery

## 2022-11-29 ENCOUNTER — Ambulatory Visit (INDEPENDENT_AMBULATORY_CARE_PROVIDER_SITE_OTHER): Payer: Medicare Other | Admitting: Physician Assistant

## 2022-11-29 VITALS — BP 155/97 | HR 85 | Temp 97.6°F | Resp 16 | Ht 67.0 in | Wt 165.0 lb

## 2022-11-29 DIAGNOSIS — I739 Peripheral vascular disease, unspecified: Secondary | ICD-10-CM | POA: Insufficient documentation

## 2022-11-29 DIAGNOSIS — M79605 Pain in left leg: Secondary | ICD-10-CM | POA: Insufficient documentation

## 2022-11-30 ENCOUNTER — Telehealth: Payer: Self-pay | Admitting: Dietician

## 2022-11-30 NOTE — Telephone Encounter (Signed)
Tried calling this patient about fax for mail order diabetes testing supplies. Their voicemail box is not set up. I was unable to leave a message. It appears that she is getting testing supplies at [facility].

## 2022-12-02 ENCOUNTER — Ambulatory Visit: Payer: Medicare Other | Admitting: Orthopedic Surgery

## 2022-12-02 DIAGNOSIS — H35351 Cystoid macular degeneration, right eye: Secondary | ICD-10-CM | POA: Diagnosis not present

## 2022-12-02 DIAGNOSIS — H43822 Vitreomacular adhesion, left eye: Secondary | ICD-10-CM | POA: Diagnosis not present

## 2022-12-09 DIAGNOSIS — G8929 Other chronic pain: Secondary | ICD-10-CM | POA: Diagnosis not present

## 2022-12-09 DIAGNOSIS — F17200 Nicotine dependence, unspecified, uncomplicated: Secondary | ICD-10-CM | POA: Diagnosis not present

## 2022-12-09 DIAGNOSIS — G894 Chronic pain syndrome: Secondary | ICD-10-CM | POA: Diagnosis not present

## 2022-12-09 DIAGNOSIS — Z9181 History of falling: Secondary | ICD-10-CM | POA: Diagnosis not present

## 2022-12-09 DIAGNOSIS — K5909 Other constipation: Secondary | ICD-10-CM | POA: Diagnosis not present

## 2022-12-09 DIAGNOSIS — M25512 Pain in left shoulder: Secondary | ICD-10-CM | POA: Diagnosis not present

## 2022-12-09 DIAGNOSIS — F17210 Nicotine dependence, cigarettes, uncomplicated: Secondary | ICD-10-CM | POA: Diagnosis not present

## 2022-12-09 DIAGNOSIS — M62830 Muscle spasm of back: Secondary | ICD-10-CM | POA: Diagnosis not present

## 2022-12-09 DIAGNOSIS — I10 Essential (primary) hypertension: Secondary | ICD-10-CM | POA: Diagnosis not present

## 2022-12-09 DIAGNOSIS — M5416 Radiculopathy, lumbar region: Secondary | ICD-10-CM | POA: Diagnosis not present

## 2022-12-09 DIAGNOSIS — E119 Type 2 diabetes mellitus without complications: Secondary | ICD-10-CM | POA: Diagnosis not present

## 2022-12-09 DIAGNOSIS — Z79899 Other long term (current) drug therapy: Secondary | ICD-10-CM | POA: Diagnosis not present

## 2022-12-10 ENCOUNTER — Ambulatory Visit: Payer: Self-pay

## 2022-12-10 ENCOUNTER — Ambulatory Visit (INDEPENDENT_AMBULATORY_CARE_PROVIDER_SITE_OTHER): Payer: Medicare Other

## 2022-12-10 ENCOUNTER — Ambulatory Visit (INDEPENDENT_AMBULATORY_CARE_PROVIDER_SITE_OTHER): Payer: Medicare Other | Admitting: Orthopedic Surgery

## 2022-12-10 DIAGNOSIS — M25551 Pain in right hip: Secondary | ICD-10-CM | POA: Diagnosis not present

## 2022-12-10 DIAGNOSIS — M7061 Trochanteric bursitis, right hip: Secondary | ICD-10-CM

## 2022-12-12 ENCOUNTER — Encounter: Payer: Self-pay | Admitting: Orthopedic Surgery

## 2022-12-12 MED ORDER — LIDOCAINE HCL 1 % IJ SOLN
5.0000 mL | INTRAMUSCULAR | Status: AC | PRN
  Administered 2022-12-10: 5 mL

## 2022-12-12 MED ORDER — METHYLPREDNISOLONE ACETATE 80 MG/ML IJ SUSP
80.0000 mg | INTRAMUSCULAR | Status: AC | PRN
  Administered 2022-12-10: 80 mg via INTRA_ARTICULAR

## 2022-12-12 MED ORDER — BUPIVACAINE HCL 0.25 % IJ SOLN
4.0000 mL | INTRAMUSCULAR | Status: AC | PRN
  Administered 2022-12-10: 4 mL via INTRA_ARTICULAR

## 2022-12-12 NOTE — Progress Notes (Signed)
Office Visit Note   Patient: Susan Holmes           Date of Birth: 01-28-1956           MRN: [id number] Visit Date: 12/10/2022 Requested by: Lottie Mussel, MD [address], [place],  [place] [address] PCP: Lottie Mussel, MD  Subjective: Chief Complaint  Patient presents with   Right Hip - Pain    HPI: Susan Holmes is a 66 y.o. female who presents to the office reporting right hip pain particularly on the trochanteric region.  She cannot sleep on the right-hand side.  Been going on a month.  Denies any history of trauma.  Has taken Tylenol Advil and ibuprofen for her symptoms.  Symptoms are definitely worse laying down.  Reports minimal groin pain and no numbness and tingling and no back pain..                ROS: All systems reviewed are negative as they relate to the chief complaint within the history of present illness.  Patient denies fevers or chills.  Assessment & Plan: Visit Diagnoses:  1. Pain in right hip     Plan: Impression is right hip trochanteric bursitis.  Ultrasound-guided injection performed today.  Iliotibial band stretching exercises discussed.  If this does not help her we could consider further imaging.  Does not look like it is referred pain from the back and is not look like she has radiographic right hip arthritis.  Follow-Up Instructions: No follow-ups on file.   Orders:  Orders Placed This Encounter  Procedures   XR HIP UNILAT W OR W/O PELVIS 2-3 VIEWS RIGHT   US Guided Needle Placement - No Linked Charges   No orders of the defined types were placed in this encounter.     Procedures: Large Joint Inj: R greater trochanter on 12/10/2022 3:21 PM Indications: pain and diagnostic evaluation Details: 18 G 3.5 in needle, ultrasound-guided lateral approach  Arthrogram: No  Medications: 5 mL lidocaine 1 %; 80 mg methylPREDNISolone acetate 80 MG/ML; 4 mL bupivacaine 0.25 % Outcome: tolerated well, no immediate complications Procedure,  treatment alternatives, risks and benefits explained, specific risks discussed. Consent was given by the patient. Immediately prior to procedure a time out was called to verify the correct patient, procedure, equipment, support staff and site/side marked as required. Patient was prepped and draped in the usual sterile fashion.       Clinical Data: No additional findings.  Objective: Vital Signs: There were no vitals taken for this visit.  Physical Exam:  Constitutional: Patient appears well-developed HEENT:  Head: Normocephalic Eyes:EOM are normal Neck: Normal range of motion Cardiovascular: Normal rate Pulmonary/chest: Effort normal Neurologic: Patient is alert Skin: Skin is warm Psychiatric: Patient has normal mood and affect  Ortho Exam: Ortho exam demonstrates normal gait and alignment.  No Trendelenburg gait.  No real groin pain on the right-hand side or left-hand side with internal or external rotation of the hip.  Range of motion is symmetric and full.  No masses lymphadenopathy or skin changes noted in that right hip region.  Pedal pulses palpable.  Specialty Comments:  No specialty comments available.  Imaging: No results found.   PMFS History: Patient Active Problem List   Diagnosis Date Noted   Itching of both hands 11/11/2022   Hyperglycemia due to type 2 diabetes mellitus ([place]) 05/17/2022   Osteopenia 05/17/2022   Personal history of colonic polyps 05/17/2022   Lumbar spondylosis 06/19/2021  Degeneration of lumbar intervertebral disc 06/05/2021   Transient leg weakness 02/20/2021   Delayed gastric emptying 12/31/2020   Erosive gastropathy 12/31/2020   Fall 11/24/2020   Hearing loss 10/23/2020   Polypharmacy 09/09/2020   Neck pain 12/17/2019   Insulin dependent type 2 diabetes mellitus ([place]) 10/04/2019   Reactive airway disease 06/20/2018   Unintentional weight loss 06/14/2018   History of CVA (cerebrovascular accident) 07/23/2016   Osteoarthritis  12/11/2014   History of chest pain 10/07/2014   Pre-operative cardiovascular examination 10/07/2014   Liver hemangioma 04/04/2014   RBC microcytosis 02/05/2014   Hyperparathyroidism ([place]) 03/08/2013   Calcific tendinitis of right shoulder 02/26/2012   Hyperlipidemia associated with type 2 diabetes mellitus ([place]) 06/12/2007   TOBACCO ABUSE 06/12/2007   Depression 06/12/2007   CONGENITAL BLINDNESS, LEFT EYE 06/12/2007   Essential hypertension 06/12/2007   Past Medical History:  Diagnosis Date   Anxiety    Arthritis    Callus of foot 09/19/2018   Chest pain 09/26/2018   Claudication ([place]) 09/14/2019   Congenital blindness    Left  eye   Coronary artery disease    per cardiac cath 2015-- mild disease involving LAD and branches ,  LCFx and branches, and mild disease pRCA   Cough 07/08/2016   Depression    Diabetes mellitus, type II ([place])    followed by pcp   Glaucoma    Glaucoma, both eyes    Hepatitis B infection pt unsure   resolved   History of kidney stones    right stone   History of transient ischemic attack (TIA) 2017   per pt no residuals   History of vitamin D deficiency 09/06/2017   HTN (hypertension)    followed by pcp   Hyperlipidemia    PAD (peripheral artery disease) ([place])    Paronychia of great toe, left 06/21/2019   Poor dental hygiene    Stroke ([place])    Tobacco abuse    .5 PPD smoker   Uncontrolled type II diabetes mellitus 06/12/2007   Qualifier: Diagnosis of  By: Drinkard MSN, FNP-C, Collie Siad      Family History  Problem Relation Age of Onset   Heart disease Mother 39   Hypertension Mother    CVA Mother    Heart disease Father 49   CVA Sister    Heart disease Sister    Aneurysm Sister    CVA Brother    Aneurysm Brother    CVA Maternal Grandmother    CVA Maternal Grandfather    Hypertension Brother    Colon cancer Neg Hx    Esophageal cancer Neg Hx    Stomach cancer Neg Hx     Past Surgical History:  Procedure Laterality Date   ABDOMINAL  AORTOGRAM W/LOWER EXTREMITY N/A 09/14/2019   Procedure: ABDOMINAL AORTOGRAM W/LOWER EXTREMITY;  Surgeon: Elam Dutch, MD;  Location: [facility];  Service: Cardiovascular;  Laterality: N/A;   ABDOMINAL HYSTERECTOMY  1980s   unsure if ovaries were taken   BYPASS GRAFT Left 10/22/2019    Left femoral to above-knee popliteal bypass using reversed ipsilateral greater saphenous vein   colonoscopy     FEMORAL-POPLITEAL BYPASS GRAFT Left 10/22/2019   Procedure: LEFT FEMORAL-ABOVE KNEE POPLITEAL  BYPASS GRAFT with REVERSED GREATER SAPHENOUS VEIN;  Surgeon: Elam Dutch, MD;  Location: [facility];  Service: Vascular;  Laterality: Left;   GLAUCOMA SURGERY Right 2017   IR URETERAL STENT RIGHT NEW ACCESS W/O SEP NEPHROSTOMY CATH  10/03/2018  LEFT HEART CATHETERIZATION WITH CORONARY ANGIOGRAM N/A 10/09/2014   Procedure: LEFT HEART CATHETERIZATION WITH CORONARY ANGIOGRAM;  Surgeon: Troy Sine, MD;  [facility] C[facility];  Service: Cardiovascular;  Laterality: N/A;   NEPHROLITHOTOMY Right 10/03/2018   Procedure: RIGHT NEPHROLITHOTOMY PERCUTANEOUS FIRST STAGE;  Surgeon: Irine Seal, MD;  Location: [facility];  Service: Urology;  Laterality: Right;   PERIPHERAL VASCULAR INTERVENTION Left 09/14/2019   Procedure: PERIPHERAL VASCULAR INTERVENTION;  Surgeon: Elam Dutch, MD;  Location: [facility];  Service: Cardiovascular;  Laterality: Left;   left external iliac stent    TEE WITHOUT CARDIOVERSION N/A 08/17/2016   Procedure: TRANSESOPHAGEAL ECHOCARDIOGRAM (TEE);  Surgeon: Sueanne Margarita, MD;  Location: [facility] ENDOSCOPY;  Service: Cardiovascular;  Laterality: N/A;   Social History   Occupational History   Occupation: Disabled  Tobacco Use   Smoking status: Every Day    Packs/day: 0.50    Years: 39.00    Total pack years: 19.50    Types: Cigarettes    Start date: 1981   Smokeless tobacco: Current    Types: Chew   Tobacco comments:    6 cigs per day  Vaping Use   Vaping Use: Never  used  Substance and Sexual Activity   Alcohol use: No    Alcohol/week: 0.0 standard drinks of alcohol   Drug use: No   Sexual activity: Not Currently    Birth control/protection: Surgical

## 2022-12-13 ENCOUNTER — Other Ambulatory Visit: Payer: Self-pay

## 2022-12-13 DIAGNOSIS — Z79899 Other long term (current) drug therapy: Secondary | ICD-10-CM | POA: Diagnosis not present

## 2022-12-13 MED ORDER — METFORMIN HCL ER 500 MG PO TB24: 500.0000 mg | ORAL_TABLET | Freq: Two times a day (BID) | ORAL | 2 refills | Status: DC

## 2022-12-14 ENCOUNTER — Ambulatory Visit: Payer: Medicare Other | Admitting: Podiatry

## 2022-12-14 DIAGNOSIS — H53022 Refractive amblyopia, left eye: Secondary | ICD-10-CM | POA: Diagnosis not present

## 2022-12-14 DIAGNOSIS — Z961 Presence of intraocular lens: Secondary | ICD-10-CM | POA: Diagnosis not present

## 2022-12-14 DIAGNOSIS — H401122 Primary open-angle glaucoma, left eye, moderate stage: Secondary | ICD-10-CM | POA: Diagnosis not present

## 2022-12-14 DIAGNOSIS — E119 Type 2 diabetes mellitus without complications: Secondary | ICD-10-CM | POA: Diagnosis not present

## 2022-12-14 DIAGNOSIS — H3581 Retinal edema: Secondary | ICD-10-CM | POA: Diagnosis not present

## 2022-12-14 DIAGNOSIS — H04123 Dry eye syndrome of bilateral lacrimal glands: Secondary | ICD-10-CM | POA: Diagnosis not present

## 2022-12-14 DIAGNOSIS — H401113 Primary open-angle glaucoma, right eye, severe stage: Secondary | ICD-10-CM | POA: Diagnosis not present

## 2022-12-15 ENCOUNTER — Ambulatory Visit (INDEPENDENT_AMBULATORY_CARE_PROVIDER_SITE_OTHER): Payer: Medicare Other | Admitting: Podiatry

## 2022-12-15 ENCOUNTER — Encounter: Payer: Self-pay | Admitting: Podiatry

## 2022-12-15 DIAGNOSIS — M79674 Pain in right toe(s): Secondary | ICD-10-CM

## 2022-12-15 DIAGNOSIS — M79675 Pain in left toe(s): Secondary | ICD-10-CM | POA: Diagnosis not present

## 2022-12-15 DIAGNOSIS — M2042 Other hammer toe(s) (acquired), left foot: Secondary | ICD-10-CM

## 2022-12-15 DIAGNOSIS — E1151 Type 2 diabetes mellitus with diabetic peripheral angiopathy without gangrene: Secondary | ICD-10-CM

## 2022-12-15 DIAGNOSIS — B351 Tinea unguium: Secondary | ICD-10-CM

## 2022-12-15 DIAGNOSIS — L84 Corns and callosities: Secondary | ICD-10-CM

## 2022-12-15 DIAGNOSIS — M2041 Other hammer toe(s) (acquired), right foot: Secondary | ICD-10-CM

## 2022-12-15 NOTE — Progress Notes (Signed)
This patient returns to my office for at risk foot care.  This patient requires this care by a professional since this patient will be at risk due to having diabetes.  This patient is unable to cut nails himself since the patient cannot reach his nails.These nails are painful walking and wearing shoes.  This patient presents for at risk foot care today.  General Appearance  Alert, conversant and in no acute stress.  Vascular  Dorsalis pedis and posterior tibial  pulses are palpable  bilaterally.  Capillary return is within normal limits  bilaterally. Temperature is within normal limits  bilaterally.  Neurologic  Senn-Weinstein monofilament wire test within normal limits  bilaterally. Muscle power within normal limits bilaterally.  Nails Thick disfigured discolored nails with subungual debris  from hallux to fifth toes bilaterally. No evidence of bacterial infection or drainage bilaterally.  Orthopedic  No limitations of motion  feet .  No crepitus or effusions noted.  No bony pathology or digital deformities noted.  Skin  normotropic skin noted bilaterally.  No signs of infections or ulcers noted.   Callus sub 2 right foot.  Onychomycosis  Pain in right toes  Pain in left toes  Consent was obtained for treatment procedures.   Mechanical debridement of nails 1-5  bilaterally performed with a nail nipper.  Filed with dremel without incident.    Return office visit    3 months                  Told patient to return for periodic foot care and evaluation due to potential at risk complications.   Gardiner Barefoot DPM

## 2022-12-21 ENCOUNTER — Other Ambulatory Visit: Payer: Self-pay | Admitting: Cardiovascular Disease

## 2022-12-21 NOTE — Telephone Encounter (Signed)
Rx(s) sent to pharmacy electronically.  

## 2022-12-23 DIAGNOSIS — H401122 Primary open-angle glaucoma, left eye, moderate stage: Secondary | ICD-10-CM | POA: Diagnosis not present

## 2022-12-23 DIAGNOSIS — H59031 Cystoid macular edema following cataract surgery, right eye: Secondary | ICD-10-CM | POA: Diagnosis not present

## 2022-12-23 DIAGNOSIS — H401113 Primary open-angle glaucoma, right eye, severe stage: Secondary | ICD-10-CM | POA: Diagnosis not present

## 2023-01-03 ENCOUNTER — Telehealth: Payer: Self-pay | Admitting: *Deleted

## 2023-01-03 DIAGNOSIS — Z86010 Personal history of colonic polyps: Secondary | ICD-10-CM | POA: Diagnosis not present

## 2023-01-03 DIAGNOSIS — I10 Essential (primary) hypertension: Secondary | ICD-10-CM | POA: Diagnosis not present

## 2023-01-03 DIAGNOSIS — I2510 Atherosclerotic heart disease of native coronary artery without angina pectoris: Secondary | ICD-10-CM | POA: Diagnosis not present

## 2023-01-03 DIAGNOSIS — I739 Peripheral vascular disease, unspecified: Secondary | ICD-10-CM | POA: Diagnosis not present

## 2023-01-03 NOTE — Telephone Encounter (Signed)
Pt has appt 01/11/23 with Dr. Johney Frame. I have added need pre op clearance to appt notes. I will send FYI to requesting office.

## 2023-01-03 NOTE — Telephone Encounter (Signed)
   Name: Susan Holmes  DOB: 04-Jan-1956  MRN: [id number]  Primary Cardiologist: Skeet Latch, MD  Chart reviewed as part of pre-operative protocol coverage. Because of Susan Holmes's past medical history and time since last visit, she will require a follow-up telephone visit in order to better assess preoperative cardiovascular risk.  Pre-op covering staff: - Please schedule appointment and call patient to inform them. If patient already had an upcoming appointment within acceptable timeframe, please add "pre-op clearance" to the appointment notes so provider is aware. - Please contact requesting surgeon's office via preferred method (i.e, phone, fax) to inform them of need for appointment prior to surgery.  She is taking ASA for nonobstructive CAD.  She can hold her ASA x 5 days prior to her procedure.  Please restart medically safe to do so  Elgie Collard, PA-C  01/03/2023, 1:32 PM

## 2023-01-03 NOTE — Telephone Encounter (Signed)
   Pre-operative Risk Assessment    Patient Name: Susan Holmes  DOB: 20-Nov-1956 MRN: [id number]      Request for Surgical Clearance    Procedure:   COLONOSCOPY  Date of Surgery:  Clearance 02/17/23                                 Surgeon:  DR. Arta Silence Surgeon's Group or Practice Name:  [facility] Phone number:  [phone] Fax number:  [phone]   Type of Clearance Requested:   - Medical ; ASA    Type of Anesthesia:   PROPOFOL   Additional requests/questions:    Jiles Prows   01/03/2023, 1:16 PM

## 2023-01-05 ENCOUNTER — Ambulatory Visit
Admission: RE | Admit: 2023-01-05 | Discharge: 2023-01-05 | Disposition: A | Discharge status: Home / Self Care | Payer: Medicare Other | Source: Ambulatory Visit | Attending: Internal Medicine | Admitting: Internal Medicine

## 2023-01-05 DIAGNOSIS — Z1231 Encounter for screening mammogram for malignant neoplasm of breast: Secondary | ICD-10-CM | POA: Diagnosis not present

## 2023-01-07 DIAGNOSIS — M5416 Radiculopathy, lumbar region: Secondary | ICD-10-CM | POA: Diagnosis not present

## 2023-01-07 DIAGNOSIS — Z0000 Encounter for general adult medical examination without abnormal findings: Secondary | ICD-10-CM | POA: Diagnosis not present

## 2023-01-07 DIAGNOSIS — I10 Essential (primary) hypertension: Secondary | ICD-10-CM | POA: Diagnosis not present

## 2023-01-07 DIAGNOSIS — Z79899 Other long term (current) drug therapy: Secondary | ICD-10-CM | POA: Diagnosis not present

## 2023-01-07 DIAGNOSIS — E119 Type 2 diabetes mellitus without complications: Secondary | ICD-10-CM | POA: Diagnosis not present

## 2023-01-07 DIAGNOSIS — M62830 Muscle spasm of back: Secondary | ICD-10-CM | POA: Diagnosis not present

## 2023-01-07 DIAGNOSIS — Z9181 History of falling: Secondary | ICD-10-CM | POA: Diagnosis not present

## 2023-01-07 DIAGNOSIS — G8929 Other chronic pain: Secondary | ICD-10-CM | POA: Diagnosis not present

## 2023-01-07 DIAGNOSIS — K5909 Other constipation: Secondary | ICD-10-CM | POA: Diagnosis not present

## 2023-01-07 DIAGNOSIS — M25512 Pain in left shoulder: Secondary | ICD-10-CM | POA: Diagnosis not present

## 2023-01-07 DIAGNOSIS — F17200 Nicotine dependence, unspecified, uncomplicated: Secondary | ICD-10-CM | POA: Diagnosis not present

## 2023-01-07 DIAGNOSIS — F17210 Nicotine dependence, cigarettes, uncomplicated: Secondary | ICD-10-CM | POA: Diagnosis not present

## 2023-01-10 NOTE — Progress Notes (Unsigned)
Cardiology Office Note:    Date:  01/10/2023   ID:  AMBERLIN UTKE, DOB 1956-06-03, MRN [id number]  PCP:  Lottie Mussel, [place] Prov[facility] Cardiologist:  Skeet Latch, MD { Click to update primary MD,subspecialty MD or APP then REFRESH:1}    Referring MD: Lottie Mussel, MD   No chief complaint on file. ***  History of Present Illness:    Susan Holmes is a 67 y.o. female with a hx of DMII, HTN, HLD, nonobstructive CAD, carotid stenosis, CVA, PAD s/p left femoral to popliteal grafting with reversed ipsilateral GSV and tobacco abuse who presents to clinic for follow-up.  Per review of the record, TTE in 2015 with LVEF 40-45% with global hypokinesis. LHC at that time with nonobstructive CAD. Was seen in 2017 for chest pain. Myoview revealed no evidence of ischemia, LVEF 52% .  She also noted to have a history of ischemic stroke. TEE 08/17/2016 with LVEF 50-55%, mild MR, no ASD, PFO or LAA thrombus. Carotid Doppler showed moderate L ECA stenosis and mild L ICA stenosis.  She was started on aspirin atorvastatin 80 mg but this was stopped due to myalgias.  She did report frequent palpitations and had a 30-day monitor placed which showed occasional PACs and PVCs.  Had repeat myoview in 2019 for chest pain which was negative for ischemia, EF 57%.  Was last seen in 09/2022 with Nicholes Rough was she was stable from a CV standpoint. Continued to smoke.  She has been added to the schedule today as urgent visit for pre-op clearance prior to colonoscopy.  Today, ***  Past Medical History:  Diagnosis Date   Anxiety    Arthritis    Callus of foot 09/19/2018   Chest pain 09/26/2018   Claudication ([place]) 09/14/2019   Congenital blindness    Left  eye   Coronary artery disease    per cardiac cath 2015-- mild disease involving LAD and branches ,  LCFx and branches, and mild disease pRCA   Cough 07/08/2016   Depression    Diabetes mellitus, type II ([place])    followed by pcp    Glaucoma    Glaucoma, both eyes    Hepatitis B infection pt unsure   resolved   History of kidney stones    right stone   History of transient ischemic attack (TIA) 2017   per pt no residuals   History of vitamin D deficiency 09/06/2017   HTN (hypertension)    followed by pcp   Hyperlipidemia    PAD (peripheral artery disease) ([facility])    Paronychia of great toe, left 06/21/2019   Poor dental hygiene    Stroke ([place])    Tobacco abuse    .5 PPD smoker   Uncontrolled type II diabetes mellitus 06/12/2007   Qualifier: Diagnosis of  By: Drinkard MSN, FNP-C, Collie Siad      Past Surgical History:  Procedure Laterality Date   ABDOMINAL AORTOGRAM W/LOWER EXTREMITY N/A 09/14/2019   Procedure: ABDOMINAL AORTOGRAM W/LOWER EXTREMITY;  Surgeon: Elam Dutch, MD;  Location: [facility];  Service: Cardiovascular;  Laterality: N/A;   ABDOMINAL HYSTERECTOMY  1980s   unsure if ovaries were taken   BYPASS GRAFT Left 10/22/2019    Left femoral to above-knee popliteal bypass using reversed ipsilateral greater saphenous vein   colonoscopy     FEMORAL-POPLITEAL BYPASS GRAFT Left 10/22/2019   Procedure: LEFT FEMORAL-ABOVE KNEE POPLITEAL  BYPASS GRAFT with REVERSED GREATER SAPHENOUS VEIN;  Surgeon: Oneida Alar,  Jessy Oto, MD;  Location: [facility];  Service: Vascular;  Laterality: Left;   GLAUCOMA SURGERY Right 2017   IR URETERAL STENT RIGHT NEW ACCESS W/O SEP NEPHROSTOMY CATH  10/03/2018   LEFT HEART CATHETERIZATION WITH CORONARY ANGIOGRAM N/A 10/09/2014   Procedure: LEFT HEART CATHETERIZATION WITH CORONARY ANGIOGRAM;  Surgeon: Troy Sine, MD;  Location: [facility] CATH LAB;  Service: Cardiovascular;  Laterality: N/A;   NEPHROLITHOTOMY Right 10/03/2018   Procedure: RIGHT NEPHROLITHOTOMY PERCUTANEOUS FIRST STAGE;  Surgeon: Irine Seal, MD;  Location: [facility];  Service: Urology;  Laterality: Right;   PERIPHERAL VASCULAR INTERVENTION Left 09/14/2019   Procedure: PERIPHERAL VASCULAR INTERVENTION;  Surgeon: Elam Dutch,  MD;  Location: [facility];  Service: Cardiovascular;  Laterality: Left;   left external iliac stent    TEE WITHOUT CARDIOVERSION N/A 08/17/2016   Procedure: TRANSESOPHAGEAL ECHOCARDIOGRAM (TEE);  Surgeon: Sueanne Margarita, MD;  Location[facility] ENDOSCOPY;  Service: Cardiovascular;  Laterality: N/A;    Current Medications: No outpatient medications have been marked as taking for the 01/11/23 encounter (Appointment) with Freada Bergeron, MD.     Allergies:   Ace inhibitors, Lipitor [atorvastatin], and Chantix [varenicline]   Social History   Socioeconomic History   Marital status: Divorced    Spouse name: Not on file   Number of children: 1   Years of education: 12   Highest education level: Not on file  Occupational History   Occupation: Disabled  Tobacco Use   Smoking status: Every Day    Packs/day: 0.50    Years: 39.00    Total pack years: 19.50    Types: Cigarettes    Start date: 1981   Smokeless tobacco: Current    Types: Chew   Tobacco comments:    6 cigs per day  Vaping Use   Vaping Use: Never used  Substance and Sexual Activity   Alcohol use: No    Alcohol/week: 0.0 standard drinks of alcohol   Drug use: No   Sexual activity: Not Currently    Birth control/protection: Surgical  Other Topics Concern   Not on file  Social History Narrative   Current Social History 08/18/2020        Patient lives alone in a ground floor apartment which is 1 story. There are 13 steps with handrail up to the entrance the patient uses.       Patient's method of transportation is [facility] transportation.      The highest level of education was 2 years college.      The patient currently disabled.      Identified important Relationships are "My daughter, brothers, grands and great-grands."       Pets : chihuahua Chief Executive Officer)       Interests / Fun: "I don't"       Current Stressors: "Nothing"       Religious / Personal Beliefs: '[employer]ist"       Other: "I am a very loving  person"      L. [place], BSN, RN-BC        Social Determinants of Health   Financial Resource Strain: Low Risk  (11/10/2022)   Overall Financial Resource Strain (CARDIA)    Difficulty of Paying Living Expenses: Not hard at all  Food Insecurity: No Food Insecurity (11/10/2022)   Hunger Vital Sign    Worried About Running Out of Food in the Last Year: Never true    Ran Out of Food in the Last Year: Never true  Transportation Needs: No Transportation Needs (11/10/2022)   PRAPARE - Hydrologist (Medical): No    Lack of Transportation (Non-Medical): No  Physical Activity: Inactive (11/10/2022)   Exercise Vital Sign    Days of Exercise per Week: 0 days    Minutes of Exercise per Session: 0 min  Stress: No Stress Concern Present (11/10/2022)   [place]    Feeling of Stress : Only a little  Social Connections: Moderately Integrated (11/10/2022)   Social Connection and Isolation Panel [NHANES]    Frequency of Communication with Friends and Family: More than three times a week    Frequency of Social Gatherings with Friends and Family: Once a week    Attends Religious Services: More than 4 times per year    Active Member of [employer] or Organizations: No    Attends Music therapist: More than 4 times per year    Marital Status: Divorced     Family History: The patient's ***family history includes Aneurysm in her brother and sister; CVA in her brother, maternal grandfather, maternal grandmother, mother, and sister; Heart disease in her sister; Heart disease (age of onset: 40) in her father and mother; Hypertension in her brother and mother. There is no history of Colon cancer, Esophageal cancer, or Stomach cancer.  ROS:   Please see the history of present illness.    *** All other systems reviewed and are negative.  EKGs/Labs/Other Studies Reviewed:    The following studies were  reviewed today: TEE 07/2016: Study Conclusions   - Left ventricle: Systolic function was normal. The estimated    ejection fraction was in the range of 50% to 55%. Wall motion was    normal; there were no regional wall motion abnormalities.  - Mitral valve: There was mild regurgitation.  - Left atrium: No evidence of thrombus in the atrial cavity or    appendage. No evidence of thrombus in the atrial cavity or    appendage.  - Right atrium: No evidence of thrombus in the atrial cavity or    appendage.  - Tricuspid valve: There was trivial regurgitation.  - Pulmonic valve: No evidence of vegetation. There was trivial    regurgitation.   EKG:  EKG is *** ordered today.  The ekg ordered today demonstrates ***  Recent Labs: 10/15/2022: BUN 6; Creatinine, Ser 0.92; Hemoglobin 11.2; Platelets 277; Potassium 4.7; Sodium 141  Recent Lipid Panel    Component Value Date/Time   CHOL 122 10/19/2022 1019   TRIG 85 10/19/2022 1019   HDL 64 10/19/2022 1019   CHOLHDL 1.9 10/19/2022 1019   CHOLHDL 3.4 04/26/2017 1403   VLDL 31 (H) 04/26/2017 1403   LDLCALC 42 10/19/2022 1019   LDLDIRECT 183 (H) 03/01/2014 0500     Risk Assessment/Calculations:   {Does this patient have ATRIAL FIBRILLATION?:[phone]}  No BP recorded.  {Refresh Note OR Click here to enter BP  :1}***         Physical Exam:    VS:  There were no vitals taken for this visit.    Wt Readings from Last 3 Encounters:  11/29/22 165 lb (74.8 kg)  11/10/22 168 lb 11.2 oz (76.5 kg)  10/15/22 169 lb 6.4 oz (76.8 kg)     GEN: *** Well nourished, well developed in no acute distress HEENT: Normal NECK: No JVD; No carotid bruits LYMPHATICS: No lymphadenopathy CARDIAC: ***RRR, no murmurs, rubs, gallops RESPIRATORY:  Clear to  auscultation without rales, wheezing or rhonchi  ABDOMEN: Soft, non-tender, non-distended MUSCULOSKELETAL:  No edema; No deformity  SKIN: Warm and dry NEUROLOGIC:  Alert and oriented x 3 PSYCHIATRIC:   Normal affect   ASSESSMENT:    No diagnosis found. PLAN:    In order of problems listed above:  #Preoperative Evaluation:  #Nonobstructive CAD: LHC in 2015 with nonobstructive CAD. Myoview 2019 with no ischemia.   #HTN: BP at goal. -Continue amlodipine '10mg'$  daily -Continue losartan '100mg'$  daily  #HLD: -Continue crestor '20mg'$  daily  #Tobacco abuse: -Cessation advised  #History of stroke: #Carotid Stenosis: TEE without evidence of PFO/ASD or LAA thrombus. Continue medical therapy. -Continue ASA and crestor -Repeat carotids for monitoring  #PAD s/p fem-pop bypass: Follows with Vascular surgery. -Continue ASA '81mg'$  daily and crestor '20mg'$  daily -Follow-up with vascular as scheduled      {Are you ordering a CV Procedure (e.g. stress test, cath, DCCV, TEE, etc)?   Press F2        :[id number]}    Medication Adjustments/Labs and Tests Ordered: Current medicines are reviewed at length with the patient today.  Concerns regarding medicines are outlined above.  No orders of the defined types were placed in this encounter.  No orders of the defined types were placed in this encounter.   There are no Patient Instructions on file for this visit.   Signed, Freada Bergeron, MD  01/10/2023 2:20 PM    [place]

## 2023-01-11 ENCOUNTER — Ambulatory Visit: Payer: Medicare Other | Attending: Cardiology | Admitting: Cardiology

## 2023-01-11 ENCOUNTER — Encounter: Payer: Self-pay | Admitting: Cardiology

## 2023-01-11 VITALS — BP 151/92 | HR 85 | Ht 67.0 in | Wt 163.4 lb

## 2023-01-11 DIAGNOSIS — I10 Essential (primary) hypertension: Secondary | ICD-10-CM

## 2023-01-11 DIAGNOSIS — I739 Peripheral vascular disease, unspecified: Secondary | ICD-10-CM

## 2023-01-11 DIAGNOSIS — E785 Hyperlipidemia, unspecified: Secondary | ICD-10-CM | POA: Diagnosis not present

## 2023-01-11 DIAGNOSIS — I779 Disorder of arteries and arterioles, unspecified: Secondary | ICD-10-CM

## 2023-01-11 DIAGNOSIS — Z794 Long term (current) use of insulin: Secondary | ICD-10-CM | POA: Diagnosis not present

## 2023-01-11 DIAGNOSIS — Z8673 Personal history of transient ischemic attack (TIA), and cerebral infarction without residual deficits: Secondary | ICD-10-CM

## 2023-01-11 DIAGNOSIS — Z01810 Encounter for preprocedural cardiovascular examination: Secondary | ICD-10-CM

## 2023-01-11 DIAGNOSIS — Z79899 Other long term (current) drug therapy: Secondary | ICD-10-CM | POA: Diagnosis not present

## 2023-01-11 DIAGNOSIS — Z87898 Personal history of other specified conditions: Secondary | ICD-10-CM

## 2023-01-11 DIAGNOSIS — E119 Type 2 diabetes mellitus without complications: Secondary | ICD-10-CM

## 2023-01-11 DIAGNOSIS — I493 Ventricular premature depolarization: Secondary | ICD-10-CM | POA: Diagnosis not present

## 2023-01-11 MED ORDER — IRBESARTAN 300 MG PO TABS: 300.0000 mg | ORAL_TABLET | Freq: Every day | ORAL | 1 refills | Status: DC

## 2023-01-11 MED ORDER — AMLODIPINE BESYLATE 10 MG PO TABS: 10.0000 mg | ORAL_TABLET | Freq: Every day | ORAL | 3 refills | Status: DC

## 2023-01-11 MED ORDER — ROSUVASTATIN CALCIUM 20 MG PO TABS: ORAL_TABLET | ORAL | 2 refills | Status: DC

## 2023-01-11 NOTE — Telephone Encounter (Signed)
   Patient Name: Susan Holmes  DOB: Oct 24, 1956 MRN: [id number]  Primary Cardiologist: Skeet Latch, MD  Chart reviewed as part of pre-operative protocol coverage. Given past medical history and time since last visit, based on ACC/AHA guidelines, [name] is at acceptable risk for the planned procedure without further cardiovascular testing.  She was seen by Dr. Johney Frame on 01/11/2023 and was able to achieve 4 METS of activity with no new cardiac complaints.  The patient was advised that if she develops new symptoms prior to surgery to contact our office to arrange for a follow-up visit, and she verbalized understanding.  Regarding ASA therapy,  may be stopped 5-7 days prior to surgery with a plan to resume it as soon as felt to be feasible from a surgical standpoint in the post-operative period.   I will route this recommendation to the requesting party via Epic fax function and remove from pre-op pool.  Please call with questions.  Mable Fill, Marissa Nestle, NP 01/11/2023, 11:20 AM

## 2023-01-11 NOTE — Patient Instructions (Signed)
Medication Instructions:   STOP TAKING LOSARTAN NOW  START TAKING IRBESARTAN 300 MG BY MOUTH DAILY  *If you need a refill on your cardiac medications before your next appointment, please call your pharmacy*   Follow-Up: At [facility], you and your health needs are our priority.  As part of our continuing mission to provide you with exceptional heart care, we have created designated Provider Care Teams.  These Care Teams include your primary Cardiologist (physician) and Advanced Practice Providers (APPs -  Physician Assistants and Nurse Practitioners) who all work together to provide you with the care you need, when you need it.  We recommend signing up for the patient portal called "MyChart".  Sign up information is provided on this After Visit Summary.  MyChart is used to connect with patients for Virtual Visits (Telemedicine).  Patients are able to view lab/test results, encounter notes, upcoming appointments, etc.  Non-urgent messages can be sent to your provider as well.   To learn more about what you can do with MyChart, go to [email or web address].    Your next appointment:   6 month(s)  Provider:   DR. Johney Frame

## 2023-01-11 NOTE — Telephone Encounter (Signed)
Patient seen and examined. Able to complete >4Mets without issues. No further work-up needed prior to colonoscopy. Can hold ASA prior to procedure if needed.

## 2023-01-11 NOTE — Progress Notes (Signed)
Cardiology Office Note:    Date:  01/11/2023   ID:  Susan Holmes, DOB 17-Dec-1956, MRN [id number]  PCP:  Lottie Mussel, MD   [facility] Cardiologist:  Skeet Latch, MD   Referring MD: Lottie Mussel, MD    History of Present Illness:    Susan Holmes is a 67 y.o. female with a hx of DMII, HTN, HLD, nonobstructive CAD, carotid stenosis, CVA, PAD s/p left femoral to popliteal grafting with reversed ipsilateral GSV and tobacco abuse who presents to clinic for follow-up.  Per review of the record, TTE in 2015 with LVEF 40-45% with global hypokinesis. LHC at that time with nonobstructive CAD. Was seen in 2017 for chest pain. Myoview revealed no evidence of ischemia, LVEF 52% .  She also noted to have a history of ischemic stroke. TEE 08/17/2016 with LVEF 50-55%, mild MR, no ASD, PFO or LAA thrombus. Carotid Doppler showed moderate L ECA stenosis and mild L ICA stenosis.  She was started on aspirin atorvastatin 80 mg but this was stopped due to myalgias.  She did report frequent palpitations and had a 30-day monitor placed which showed occasional PACs and PVCs.  Had repeat myoview in 2019 for chest pain which was negative for ischemia, EF 57%.  Was last seen in 09/2022 with Nicholes Rough was she was stable from a CV standpoint. Continued to smoke.  She has been added to the schedule today as urgent visit for pre-op clearance prior to colonoscopy.  Today, the patient states that she has been doing well. No chest pain, SOB, LE edema, orthopnea or PND. She is able to walk a flight of stairs without issues. No anginal symptoms.  Her blood pressure is high in clinic today at 150/82 initially, and on repeat was measured at 151/92. She hasn't been taking readings at home very often and is unsure if this is normal for her. We discussed keeping a log and changing medicine.   She is still smoking and doesn't plan on quitting.  She has been having some hip pain but is still able  to be active.   She denies any palpitations, chest pain, shortness of breath, or peripheral edema. No lightheadedness, headaches, syncope, orthopnea, or PND.   Past Medical History:  Diagnosis Date   Anxiety    Arthritis    Callus of foot 09/19/2018   Chest pain 09/26/2018   Claudication ([place]) 09/14/2019   Congenital blindness    Left  eye   Coronary artery disease    per cardiac cath 2015-- mild disease involving LAD and branches ,  LCFx and branches, and mild disease pRCA   Cough 07/08/2016   Depression    Diabetes mellitus, type II ([place])    followed by pcp   Glaucoma    Glaucoma, both eyes    Hepatitis B infection pt unsure   resolved   History of kidney stones    right stone   History of transient ischemic attack (TIA) 2017   per pt no residuals   History of vitamin D deficiency 09/06/2017   HTN (hypertension)    followed by pcp   Hyperlipidemia    PAD (peripheral artery disease) (HCC)    Paronychia of great toe, left 06/21/2019   Poor dental hygiene    Stroke ([place])    Tobacco abuse    .5 PPD smoker   Uncontrolled type II diabetes mellitus 06/12/2007   Qualifier: Diagnosis of  By: Drinkard MSN, FNP-C, Collie Siad  Past Surgical History:  Procedure Laterality Date   ABDOMINAL AORTOGRAM W/LOWER EXTREMITY N/A 09/14/2019   Procedure: ABDOMINAL AORTOGRAM W/LOWER EXTREMITY;  Surgeon: Elam Dutch, MD;  Location: [place] [facility];  Service: Cardiovascular;  Laterality: N/A;   ABDOMINAL HYSTERECTOMY  1980s   unsure if ovaries were taken   BYPASS GRAFT Left 10/22/2019    Left femoral to above-knee popliteal bypass using reversed ipsilateral greater saphenous vein   colonoscopy     FEMORAL-POPLITEAL BYPASS GRAFT Left 10/22/2019   Procedure: LEFT FEMORAL-ABOVE KNEE POPLITEAL  BYPASS GRAFT with REVERSED GREATER SAPHENOUS VEIN;  Surgeon: Elam Dutch, MD;  Location: [place];  Service: Vascular;  Laterality: Left;   GLAUCOMA SURGERY Right 2017   IR URETERAL STENT RIGHT NEW  ACCESS W/O SEP NEPHROSTOMY CATH  10/03/2018   LEFT HEART CATHETERIZATION WITH CORONARY ANGIOGRAM N/A 10/09/2014   Procedure: LEFT HEART CATHETERIZATION WITH CORONARY ANGIOGRAM;  Surgeon: Troy Sine, MD;  [facility] C[facility];  Service: Cardiovascular;  Laterality: N/A;   NEPHROLITHOTOMY Right 10/03/2018   Procedure: RIGHT NEPHROLITHOTOMY PERCUTANEOUS FIRST STAGE;  Surgeon: Irine Seal, MD;  Location: [facility];  Service: Urology;  Laterality: Right;   PERIPHERAL VASCULAR INTERVENTION Left 09/14/2019   Procedure: PERIPHERAL VASCULAR INTERVENTION;  Surgeon: Elam Dutch, MD;  Location: [place] [facility];  Service: Cardiovascular;  Laterality: Left;   left external iliac stent    TEE WITHOUT CARDIOVERSION N/A 08/17/2016   Procedure: TRANSESOPHAGEAL ECHOCARDIOGRAM (TEE);  Surgeon: Sueanne Margarita, MD;  Location: [facility] ENDOSCOPY;  Service: Cardiovascular;  Laterality: N/A;    Current Medications: Current Meds  Medication Sig   aspirin EC 81 MG tablet Take 1 tablet (81 mg total) by mouth daily.   baclofen (LIORESAL) 10 MG tablet Take 10 mg by mouth 3 (three) times daily as needed.   Blood Glucose Monitoring Suppl (ONETOUCH VERIO) w/Device KIT Use as directed   buprenorphine (BUTRANS) 10 MCG/HR PTWK 1 patch once a week.   busPIRone (BUSPAR) 10 MG tablet buspirone 10 mg tablet  TAKE 1 TABLET BY MOUTH THREE TIMES DAILY   celecoxib (CELEBREX) 50 MG capsule Take 50 mg by mouth 2 (two) times daily.   cetirizine (ZYRTEC) 10 MG tablet Take 10 mg by mouth daily as needed.   diclofenac Sodium (VOLTAREN) 1 % GEL Apply 4 g topically 3 (three) times daily as needed (pain).   dorzolamide-timolol (COSOPT) 22.3-6.8 MG/ML ophthalmic solution Place 1 drop into both eyes every evening.   Dulaglutide (TRULICITY) 3 CL/2.7NT SOPN Inject 3 mg as directed once a week.   Garlic (GARLIQUE PO) Take by mouth.   glucose blood (ONETOUCH VERIO) test strip Use as instructed   hydrOXYzine (ATARAX/VISTARIL) 25 MG tablet     insulin glargine (LANTUS SOLOSTAR) 100 UNIT/ML Solostar Pen Inject 45 Units into the skin at bedtime.   insulin lispro (HUMALOG KWIKPEN) 100 UNIT/ML KwikPen Inject 5 Units into the skin 3 (three) times daily.   Insulin Pen Needle 32G X 4 MM MISC Use to inject insulin 4 times a day   irbesartan (AVAPRO) 300 MG tablet Take 1 tablet (300 mg total) by mouth daily.   lidocaine (LIDODERM) 5 % 1 Patch(s) Topical   LINZESS 290 MCG CAPS capsule Take 290 mcg by mouth daily as needed.   lubiprostone (AMITIZA) 24 MCG capsule lubiprostone 24 mcg capsule  TAKE 1 CAPSULE BY MOUTH DAILY AS NEEDED   metFORMIN (GLUCOPHAGE XR) 500 MG 24 hr tablet Take 1 tablet (500 mg total) by mouth  2 (two) times daily with a meal.   mirtazapine (REMERON) 45 MG tablet Take 1 tablet (45 mg total) by mouth at bedtime.   NARCAN 4 MG/0.1ML LIQD nasal spray kit Place 1 spray into the nose once.   OLANZapine (ZYPREXA) 2.5 MG tablet Take 2.5 mg by mouth at bedtime.   OneTouch Delica Lancets 61W MISC Use with one touch Delica lancing device and Accu chek Guideme meter and Accu Chek guide strips to check your blood sugar up to 3 times a day   oxyCODONE (ROXICODONE) 15 MG immediate release tablet Take 15 mg by mouth every other day as needed for pain.    pantoprazole (PROTONIX) 40 MG tablet Take 1 tablet by mouth daily.   prednisoLONE acetate (PRED FORTE) 1 % ophthalmic suspension Place 1 drop into the right eye daily.   ramelteon (ROZEREM) 8 MG tablet Take 8 mg by mouth at bedtime.   traZODone (DESYREL) 100 MG tablet Take 300 mg by mouth at bedtime.   venlafaxine XR (EFFEXOR-XR) 75 MG 24 hr capsule Take 75 mg by mouth every morning.   [DISCONTINUED] amLODipine (NORVASC) 10 MG tablet Take 1 tablet (10 mg total) by mouth daily.   [DISCONTINUED] losartan (COZAAR) 100 MG tablet Take 1 tablet (100 mg total) by mouth daily.   [DISCONTINUED] rosuvastatin (CRESTOR) 20 MG tablet TAKE 1 TABLET(20 MG) BY MOUTH AT BEDTIME     Allergies:   Ace  inhibitors, Lipitor [atorvastatin], and Chantix [varenicline]   Social History   Socioeconomic History   Marital status: Divorced    Spouse name: Not on file   Number of children: 1   Years of education: 12   Highest education level: Not on file  Occupational History   Occupation: Disabled  Tobacco Use   Smoking status: Every Day    Packs/day: 0.50    Years: 39.00    Total pack years: 19.50    Types: Cigarettes    Start date: 1981   Smokeless tobacco: Current    Types: Chew   Tobacco comments:    6 cigs per day  Vaping Use   Vaping Use: Never used  Substance and Sexual Activity   Alcohol use: No    Alcohol/week: 0.0 standard drinks of alcohol   Drug use: No   Sexual activity: Not Currently    Birth control/protection: Surgical  Other Topics Concern   Not on file  Social History Narrative   Current Social History 08/18/2020        Patient lives alone in a ground floor apartment which is 1 story. There are 13 steps with handrail up to the entrance the patient uses.       Patient's method of transportation is [facility] transportation.      The highest level of education was 2 years college.      The patient currently disabled.      Identified important Relationships are "My daughter, brothers, grands and great-grands."       Pets : chihuahua Chief Executive Officer)       Interests / Fun: "I don't"       Current Stressors: "Nothing"       Religious / Personal Beliefs: '[employer]"       Other: "I am a very loving person"      L. [place], BSN, RN-BC        Social Determinants of Health   Financial Resource Strain: Low Risk  (11/10/2022)   Overall Financial Resource Strain (CARDIA)    Difficulty  of Paying Living Expenses: Not hard at all  Food Insecurity: No Food Insecurity (11/10/2022)   Hunger Vital Sign    Worried About Running Out of Food in the Last Year: Never true    Ran Out of Food in the Last Year: Never true  Transportation Needs: No Transportation Needs (11/10/2022)    PRAPARE - Hydrologist (Medical): No    Lack of Transportation (Non-Medical): No  Physical Activity: Inactive (11/10/2022)   Exercise Vital Sign    Days of Exercise per Week: 0 days    Minutes of Exercise per Session: 0 min  Stress: No Stress Concern Present (11/10/2022)   [place]    Feeling of Stress : Only a little  Social Connections: Moderately Integrated (11/10/2022)   Social Connection and Isolation Panel [NHANES]    Frequency of Communication with Friends and Family: More than three times a week    Frequency of Social Gatherings with Friends and Family: Once a week    Attends Religious Services: More than 4 times per year    Active Member of [employer] or Organizations: No    Attends Music therapist: More than 4 times per year    Marital Status: Divorced     Family History: The patient's family history includes Aneurysm in her brother and sister; CVA in her brother, maternal grandfather, maternal grandmother, mother, and sister; Heart disease in her sister; Heart disease (age of onset: 63) in her father and mother; Hypertension in her brother and mother. There is no history of Colon cancer, Esophageal cancer, or Stomach cancer.  ROS:   Please see the history of present illness.    Review of Systems  Constitutional:  Negative for chills and fever.  HENT:  Negative for congestion.   Eyes:  Negative for discharge.  Respiratory:  Negative for sputum production.   Cardiovascular:  Negative for chest pain, palpitations, orthopnea, claudication, leg swelling and PND.  Gastrointestinal:  Negative for heartburn, nausea and vomiting.  Genitourinary:  Negative for flank pain.  Musculoskeletal:  Negative for myalgias.  Neurological:  Negative for dizziness and loss of consciousness.  Endo/Heme/Allergies:  Negative for polydipsia.  Psychiatric/Behavioral:  Negative for substance  abuse.     All other systems reviewed and are negative.  EKGs/Labs/Other Studies Reviewed:    The following studies were reviewed today: TEE 08/15/16: Study Conclusions   - Left ventricle: Systolic function was normal. The estimated    ejection fraction was in the range of 50% to 55%. Wall motion was    normal; there were no regional wall motion abnormalities.  - Mitral valve: There was mild regurgitation.  - Left atrium: No evidence of thrombus in the atrial cavity or    appendage. No evidence of thrombus in the atrial cavity or    appendage.  - Right atrium: No evidence of thrombus in the atrial cavity or    appendage.  - Tricuspid valve: There was trivial regurgitation.  - Pulmonic valve: No evidence of vegetation. There was trivial    regurgitation.   EKG:  The EKG is personally reviewed 01/11/2023: NSR, rate 85 bpm.  Recent Labs: 10/15/2022: BUN 6; Creatinine, Ser 0.92; Hemoglobin 11.2; Platelets 277; Potassium 4.7; Sodium 141  Recent Lipid Panel    Component Value Date/Time   CHOL 122 10/19/2022 1019   TRIG 85 10/19/2022 1019   HDL 64 10/19/2022 1019   CHOLHDL 1.9 10/19/2022 1019  CHOLHDL 3.4 04/26/2017 1403   VLDL 31 (H) 04/26/2017 1403   LDLCALC 42 10/19/2022 1019   LDLDIRECT 183 (H) 03/01/2014 0500     Risk Assessment/Calculations:      HYPERTENSION CONTROL Vitals:   01/11/23 1036 01/11/23 1050  BP: (!) 150/86 (!) 151/92    The patient's blood pressure is elevated above target today.  In order to address the patient's elevated BP: A new medication was prescribed today.            Physical Exam:    VS:  BP (!) 151/92 (BP Location: Left Arm, Patient Position: Sitting, Cuff Size: Normal)   Pulse 85   Ht '5\' 7"'$  (1.702 m)   Wt 163 lb 6.4 oz (74.1 kg)   SpO2 96%   BMI 25.59 kg/m     Wt Readings from Last 3 Encounters:  01/11/23 163 lb 6.4 oz (74.1 kg)  11/29/22 165 lb (74.8 kg)  11/10/22 168 lb 11.2 oz (76.5 kg)     GEN:  Well nourished,  well developed in no acute distress HEENT: Normal NECK: No JVD; No carotid bruits CARDIAC: RRR, no murmurs, rubs, gallops RESPIRATORY:  Clear to auscultation without rales, wheezing or rhonchi  ABDOMEN: Soft, non-tender, non-distended MUSCULOSKELETAL:  No edema; No deformity  SKIN: Warm and dry NEUROLOGIC:  Alert and oriented x 3 PSYCHIATRIC:  Normal affect   ASSESSMENT:    1. Pre-operative cardiovascular examination   2. Essential hypertension   3. PAD (peripheral artery disease) ([place])   4. History of chest pain   5. PVC's (premature ventricular contractions)   6. PAOD (peripheral arterial occlusive disease) ([place])   7. Hyperlipidemia LDL goal <70   8. Insulin dependent type 2 diabetes mellitus ([place])   9. History of CVA (cerebrovascular accident)    PLAN:    In order of problems listed above:  #Preoperative Evaluation: Patient scheduled for colonoscopy. She is able to complete >4METs without issue with no indication for pre-procedure stress testing at this time. Revised cardiac risk index 10.1% due to history of stroke and use on insulin for diabetes.   #Nonobstructive CAD: LHC in 2015 with nonobstructive CAD. Myoview 2019 with no ischemia. No anginal symptoms currently. -Continue ASA '81mg'$  daily -Continue crestor '20mg'$  daily  #HTN: BP elevated on multiple checks today. Will change losartan to irbesartan '300mg'$  daily. -Continue amlodipine '10mg'$  daily -Continue irbesartan '300mg'$  daily  #HLD: -Continue crestor '20mg'$  daily -LDL at goal in 40s  #Tobacco abuse: -Cessation advised  #History of stroke: #Carotid Stenosis: TEE without evidence of PFO/ASD or LAA thrombus. Continue medical therapy. -Continue ASA and crestor -Repeat carotids for monitoring at next visit in 39month  #PAD s/p fem-pop bypass: Follows with Vascular surgery. -Continue ASA '81mg'$  daily and crestor '20mg'$  daily -Follow-up with vascular as scheduled  #IDDM: -Management per primary -On insulin -A1C much  better at 6.9         Follow Up: 6 months.  Medication Adjustments/Labs and Tests Ordered: Current medicines are reviewed at length with the patient today.  Concerns regarding medicines are outlined above.  Orders Placed This Encounter  Procedures   EKG 12-Lead   Meds ordered this encounter  Medications   rosuvastatin (CRESTOR) 20 MG tablet    Sig: TAKE 1 TABLET(20 MG) BY MOUTH AT BEDTIME    Dispense:  90 tablet    Refill:  2   amLODipine (NORVASC) 10 MG tablet    Sig: Take 1 tablet (10 mg total) by mouth daily.  Dispense:  90 tablet    Refill:  3   irbesartan (AVAPRO) 300 MG tablet    Sig: Take 1 tablet (300 mg total) by mouth daily.    Dispense:  90 tablet    Refill:  1   Patient Instructions  Medication Instructions:   STOP TAKING LOSARTAN NOW  START TAKING IRBESARTAN 300 MG BY MOUTH DAILY  *If you need a refill on your cardiac medications before your next appointment, please call your pharmacy*   Follow-Up: At [facility], you and your health needs are our priority.  As part of our continuing mission to provide you with exceptional heart care, we have created designated Provider Care Teams.  These Care Teams include your primary Cardiologist (physician) and Advanced Practice Providers (APPs -  Physician Assistants and Nurse Practitioners) who all work together to provide you with the care you need, when you need it.  We recommend signing up for the patient portal called "MyChart".  Sign up information is provided on this After Visit Summary.  MyChart is used to connect with patients for Virtual Visits (Telemedicine).  Patients are able to view lab/test results, encounter notes, upcoming appointments, etc.  Non-urgent messages can be sent to your provider as well.   To learn more about what you can do with MyChart, go to [email or web address].    Your next appointment:   6 month(s)  Provider:   DR. Johney Frame     I,Coren O'Brien,acting as a scribe  for Freada Bergeron, MD.,have documented all relevant documentation on the behalf of Freada Bergeron, MD,as directed by  Freada Bergeron, MD while in the presence of Freada Bergeron, MD.  I, Freada Bergeron, MD, have reviewed all documentation for this visit. The documentation on 01/11/23 for the exam, diagnosis, procedures, and orders are all accurate and complete.   Signed, Freada Bergeron, MD  01/11/2023 11:11 AM    [place]

## 2023-02-03 DIAGNOSIS — H401122 Primary open-angle glaucoma, left eye, moderate stage: Secondary | ICD-10-CM | POA: Diagnosis not present

## 2023-02-03 DIAGNOSIS — H401113 Primary open-angle glaucoma, right eye, severe stage: Secondary | ICD-10-CM | POA: Diagnosis not present

## 2023-02-03 DIAGNOSIS — H59031 Cystoid macular edema following cataract surgery, right eye: Secondary | ICD-10-CM | POA: Diagnosis not present

## 2023-02-03 DIAGNOSIS — H33191 Other retinoschisis and retinal cysts, right eye: Secondary | ICD-10-CM | POA: Diagnosis not present

## 2023-02-03 DIAGNOSIS — H30041 Focal chorioretinal inflammation, macular or paramacular, right eye: Secondary | ICD-10-CM | POA: Diagnosis not present

## 2023-02-07 ENCOUNTER — Other Ambulatory Visit: Payer: Self-pay

## 2023-02-07 MED ORDER — TRULICITY 3 MG/0.5ML ~~LOC~~ SOAJ: 3.0000 mg | SUBCUTANEOUS | 5 refills | Status: DC

## 2023-02-07 NOTE — Telephone Encounter (Signed)
Pharmacy also requesting med refill for Losartan

## 2023-02-08 DIAGNOSIS — M25562 Pain in left knee: Secondary | ICD-10-CM | POA: Diagnosis not present

## 2023-02-08 DIAGNOSIS — M25511 Pain in right shoulder: Secondary | ICD-10-CM | POA: Diagnosis not present

## 2023-02-08 DIAGNOSIS — M11262 Other chondrocalcinosis, left knee: Secondary | ICD-10-CM | POA: Diagnosis not present

## 2023-02-08 DIAGNOSIS — G894 Chronic pain syndrome: Secondary | ICD-10-CM | POA: Diagnosis not present

## 2023-02-08 DIAGNOSIS — M542 Cervicalgia: Secondary | ICD-10-CM | POA: Diagnosis not present

## 2023-02-08 DIAGNOSIS — G8929 Other chronic pain: Secondary | ICD-10-CM | POA: Diagnosis not present

## 2023-02-08 DIAGNOSIS — Z86718 Personal history of other venous thrombosis and embolism: Secondary | ICD-10-CM | POA: Diagnosis not present

## 2023-02-08 DIAGNOSIS — M62830 Muscle spasm of back: Secondary | ICD-10-CM | POA: Diagnosis not present

## 2023-02-08 DIAGNOSIS — M546 Pain in thoracic spine: Secondary | ICD-10-CM | POA: Diagnosis not present

## 2023-02-08 DIAGNOSIS — M5416 Radiculopathy, lumbar region: Secondary | ICD-10-CM | POA: Diagnosis not present

## 2023-02-08 DIAGNOSIS — Z79899 Other long term (current) drug therapy: Secondary | ICD-10-CM | POA: Diagnosis not present

## 2023-02-10 DIAGNOSIS — Z79899 Other long term (current) drug therapy: Secondary | ICD-10-CM | POA: Diagnosis not present

## 2023-02-11 ENCOUNTER — Telehealth: Payer: Self-pay

## 2023-02-11 NOTE — Telephone Encounter (Signed)
Pt called back .Marland Kitchen She is aware that  the med was D/C  and  was no longer taking the med so I told her she may need to notify  the pharmacy that  the med was D/C  also

## 2023-02-11 NOTE — Telephone Encounter (Signed)
Requesting refill for Losartan Unable to attach medication to note.

## 2023-02-11 NOTE — Telephone Encounter (Signed)
Called patient x2 no answer, unable to leave a voicemail mailbox is not set up.

## 2023-02-17 DIAGNOSIS — Z86010 Personal history of colonic polyps: Secondary | ICD-10-CM | POA: Diagnosis not present

## 2023-02-17 DIAGNOSIS — D123 Benign neoplasm of transverse colon: Secondary | ICD-10-CM | POA: Diagnosis not present

## 2023-02-17 DIAGNOSIS — Z09 Encounter for follow-up examination after completed treatment for conditions other than malignant neoplasm: Secondary | ICD-10-CM | POA: Diagnosis not present

## 2023-02-17 DIAGNOSIS — K648 Other hemorrhoids: Secondary | ICD-10-CM | POA: Diagnosis not present

## 2023-02-21 DIAGNOSIS — D123 Benign neoplasm of transverse colon: Secondary | ICD-10-CM | POA: Diagnosis not present

## 2023-03-02 ENCOUNTER — Other Ambulatory Visit: Payer: Self-pay

## 2023-03-02 MED ORDER — METFORMIN HCL ER 500 MG PO TB24: 500.0000 mg | ORAL_TABLET | Freq: Two times a day (BID) | ORAL | 3 refills | Status: DC

## 2023-03-18 ENCOUNTER — Ambulatory Visit (INDEPENDENT_AMBULATORY_CARE_PROVIDER_SITE_OTHER): Payer: 59 | Admitting: Podiatry

## 2023-03-18 ENCOUNTER — Encounter: Payer: Self-pay | Admitting: Podiatry

## 2023-03-18 DIAGNOSIS — M79674 Pain in right toe(s): Secondary | ICD-10-CM | POA: Diagnosis not present

## 2023-03-18 DIAGNOSIS — E1151 Type 2 diabetes mellitus with diabetic peripheral angiopathy without gangrene: Secondary | ICD-10-CM

## 2023-03-18 DIAGNOSIS — B351 Tinea unguium: Secondary | ICD-10-CM

## 2023-03-18 DIAGNOSIS — M79675 Pain in left toe(s): Secondary | ICD-10-CM

## 2023-03-18 DIAGNOSIS — L84 Corns and callosities: Secondary | ICD-10-CM | POA: Diagnosis not present

## 2023-03-18 NOTE — Progress Notes (Signed)
This patient returns to my office for at risk foot care.  This patient requires this care by a professional since this patient will be at risk due to having diabetes.  This patient is unable to cut nails himself since the patient cannot reach his nails.These nails are painful walking and wearing shoes.  This patient presents for at risk foot care today.  General Appearance  Alert, conversant and in no acute stress.  Vascular  Dorsalis pedis and posterior tibial  pulses are palpable  bilaterally.  Capillary return is within normal limits  bilaterally. Temperature is within normal limits  bilaterally.  Neurologic  Senn-Weinstein monofilament wire test within normal limits  bilaterally. Muscle power within normal limits bilaterally.  Nails Thick disfigured discolored nails with subungual debris  from hallux to fifth toes bilaterally. No evidence of bacterial infection or drainage bilaterally.  Orthopedic  No limitations of motion  feet .  No crepitus or effusions noted.  No bony pathology or digital deformities noted.  Skin  normotropic skin noted bilaterally.  No signs of infections or ulcers noted.   Callus sub 2 right foot.  Onychomycosis  Pain in right toes  Pain in left toes   Porokeratosis right forefoot.  Consent was obtained for treatment procedures.   Mechanical debridement of nails 1-5  bilaterally performed with a nail nipper.  Filed with dremel without incident. Debride porokeratosis with # 15 blade and dremel tool.   Return office visit    3 months                  Told patient to return for periodic foot care and evaluation due to potential at risk complications.   Gardiner Barefoot DPM

## 2023-04-08 ENCOUNTER — Ambulatory Visit (INDEPENDENT_AMBULATORY_CARE_PROVIDER_SITE_OTHER): Payer: 59

## 2023-04-08 VITALS — BP 153/88 | HR 85 | Temp 97.7°F | Ht 67.0 in | Wt 160.1 lb

## 2023-04-08 DIAGNOSIS — F32A Depression, unspecified: Secondary | ICD-10-CM | POA: Diagnosis not present

## 2023-04-08 DIAGNOSIS — Z79899 Other long term (current) drug therapy: Secondary | ICD-10-CM

## 2023-04-08 DIAGNOSIS — Z794 Long term (current) use of insulin: Secondary | ICD-10-CM

## 2023-04-08 DIAGNOSIS — I10 Essential (primary) hypertension: Secondary | ICD-10-CM

## 2023-04-08 DIAGNOSIS — M25551 Pain in right hip: Secondary | ICD-10-CM | POA: Diagnosis not present

## 2023-04-08 DIAGNOSIS — E119 Type 2 diabetes mellitus without complications: Secondary | ICD-10-CM | POA: Diagnosis not present

## 2023-04-08 DIAGNOSIS — Z7984 Long term (current) use of oral hypoglycemic drugs: Secondary | ICD-10-CM

## 2023-04-08 LAB — POCT GLYCOSYLATED HEMOGLOBIN (HGB A1C): Hemoglobin A1C: 5.5 % (ref 4.0–5.6)

## 2023-04-08 LAB — GLUCOSE, CAPILLARY: Glucose-Capillary: 124 mg/dL — ABNORMAL HIGH (ref 70–99)

## 2023-04-08 MED ORDER — IRBESARTAN 300 MG PO TABS: 300.0000 mg | ORAL_TABLET | Freq: Every day | ORAL | 1 refills | Status: DC

## 2023-04-08 MED ORDER — LANTUS SOLOSTAR 100 UNIT/ML ~~LOC~~ SOPN
40.0000 [IU] | PEN_INJECTOR | Freq: Every day | SUBCUTANEOUS | 8 refills | Status: DC
Start: 2023-04-08 — End: 2023-07-15

## 2023-04-08 MED ORDER — AMLODIPINE BESYLATE 10 MG PO TABS: 10.0000 mg | ORAL_TABLET | Freq: Every day | ORAL | 3 refills | Status: DC

## 2023-04-08 MED ORDER — BUSPIRONE HCL 10 MG PO TABS: ORAL_TABLET | ORAL | 0 refills | Status: DC

## 2023-04-08 NOTE — Assessment & Plan Note (Signed)
A1c today is 5.5, significant reduced from earlier this year.  Glucometer readings also look okay, average is 150 with only occasional highs.  She only checks her blood glucose after dinner.  She denies any vision changes, urinary changes, worsening fatigue.  She does inquire about the possibility of stopping metformin because she has heard it can damage the liver.  We discussed the side effect profile of metformin as well as the benefits in terms of diabetes management as well as cardiac and renal protection.  She occasionally does have some diarrhea especially when she takes her morning dose without breakfast.  After discussion of risks and benefits, she opted to continue at this time.  Overall, I do not think we need to make any changes right now.  She can continue taking Trulicity 3, metformin 500 twice daily, Lantus 40 nightly as well as Humalog 5 3 times daily.  Also encouraged her to check her blood sugar at least twice a day, ideally 4 times a day as she is taking Humalog.

## 2023-04-08 NOTE — Assessment & Plan Note (Signed)
She has chronic right hip pain and lower back pain which she says is really bothering her.  She describes a sore aching along her right hip as well as her right lateral thigh which is worse when she stays still.  She had no preceding trauma or falls.  Actually improves with activity.  She is sees a pain management clinic and is on both buprenorphine weekly patch as well as oxycodone 15 mg every 2 days as needed.  She is also prescribed multiple other pain management options including Lidoderm, Voltaren, and over-the-counter medications.  She has had workup including right hip x-ray which was unremarkable as well as lumbar spine MRI in 2022 which only showed mild degenerative changes and spondylolysis.  She follows with orthopedics and they have tried steroid injections, most recently about 3 months ago with provided some temporary relief.  Exam is benign with only minimal tenderness to palpation over the right hip and proximal right lower extremity, full range of motion and strength, no sensory deficits or effusion.  Unsure exactly what the etiology of her hip pain is.  Her prior imaging is not consistent with severe OA of the right hip.  Her pain is not sciatic in nature and her spine imaging is also benign.  Low suspicion at this time for osteonecrosis of the joint, though this is a possibility that can be further explored with hip MRI down the line if needed.  I think most likely that she has a superficial MSK issue such as trochanteric bursitis or IT band syndrome.  She can continue to follow-up with orthopedics for steroid injections if those provide her relief, but I think in the long run that continued exercises and PT will likely help her the most.  Do not think she warrants any further pain management at this time as she is already on several pain medications.  She is amenable to trialing PT.  Will follow-up at her NOV and see if her symptoms are improved.

## 2023-04-08 NOTE — Progress Notes (Signed)
CC: A1c, hip pain  HPI:  [name] is a 67 y.o.-year-old female with past medical history as below presenting for A1c, hip pain.  Please see encounters tab for problem-based charting.  Past Medical History:  Diagnosis Date   Anxiety    Arthritis    Callus of foot 09/19/2018   Chest pain 09/26/2018   Claudication (HCC) 09/14/2019   Congenital blindness    Left  eye   Coronary artery disease    per cardiac cath 2015-- mild disease involving LAD and branches ,  LCFx and branches, and mild disease pRCA   Cough 07/08/2016   Depression    Diabetes mellitus, type II (HCC)    followed by pcp   Glaucoma    Glaucoma, both eyes    Hepatitis B infection pt unsure   resolved   History of kidney stones    right stone   History of transient ischemic attack (TIA) 2017   per pt no residuals   History of vitamin D deficiency 09/06/2017   HTN (hypertension)    followed by pcp   Hyperlipidemia    PAD (peripheral artery disease) (HCC)    Paronychia of great toe, left 06/21/2019   Poor dental hygiene    Stroke (HCC)    Tobacco abuse    .5 PPD smoker   Uncontrolled type II diabetes mellitus 06/12/2007   Qualifier: Diagnosis of  By: Drinkard MSN, FNP-C, Sue     Review of Systems: As in HPI.  Please see encounters tab for problem based charting.  Physical Exam:  Vitals:   04/08/23 1034  BP: (!) 153/88  Pulse: 85  Temp: 97.7 F (36.5 C)  TempSrc: Oral  SpO2: 99%  Weight: 160 lb 1.6 oz (72.6 kg)  Height: 5\' 7"  (1.702 m)   General:Well-appearing, pleasant, In NAD Cardiac: RRR, no murmurs rubs or gallops. Respiratory: Normal work of breathing on room air, CTAB Abdominal: Soft, nontender, nondistended MSK: Full ROM, strength of RLE. No sensory deficits, negative straight leg raise.  Assessment & Plan:   Essential hypertension Prescribed amlodipine 10 mg daily and irbesartan 300 mg daily, but she was unaware that she was prescribed 2 blood pressure medicines and unsure if  she is taking both.  Blood pressure was slightly elevated in the clinic today, but no symptoms.  She was also in pain because of her right hip.  Do not think we need to make any changes right now, but can monitor.  I do think polypharmacy and confusion with medications is likely inhibiting her ability to take the right blood pressure medicines on time.  Insulin dependent type 2 diabetes mellitus (HCC) A1c today is 5.5, significant reduced from earlier this year.  Glucometer readings also look okay, average is 150 with only occasional highs.  She only checks her blood glucose after dinner.  She denies any vision changes, urinary changes, worsening fatigue.  She does inquire about the possibility of stopping metformin because she has heard it can damage the liver.  We discussed the side effect profile of metformin as well as the benefits in terms of diabetes management as well as cardiac and renal protection.  She occasionally does have some diarrhea especially when she takes her morning dose without breakfast.  After discussion of risks and benefits, she opted to continue at this time.  Overall, I do not think we need to make any changes right now.  She can continue taking Trulicity 3, metformin 500 twice daily, Lantus 40  nightly as well as Humalog 5 3 times daily.  Also encouraged her to check her blood sugar at least twice a day, ideally 4 times a day as she is taking Humalog.  Right hip pain She has chronic right hip pain and lower back pain which she says is really bothering her.  She describes a sore aching along her right hip as well as her right lateral thigh which is worse when she stays still.  She had no preceding trauma or falls.  Actually improves with activity.  She is sees a pain management clinic and is on both buprenorphine weekly patch as well as oxycodone 15 mg every 2 days as needed.  She is also prescribed multiple other pain management options including Lidoderm, Voltaren, and  over-the-counter medications.  She has had workup including right hip x-ray which was unremarkable as well as lumbar spine MRI in 2022 which only showed mild degenerative changes and spondylolysis.  She follows with orthopedics and they have tried steroid injections, most recently about 3 months ago with provided some temporary relief.  Exam is benign with only minimal tenderness to palpation over the right hip and proximal right lower extremity, full range of motion and strength, no sensory deficits or effusion.  Unsure exactly what the etiology of her hip pain is.  Her prior imaging is not consistent with severe OA of the right hip.  Her pain is not sciatic in nature and her spine imaging is also benign.  Low suspicion at this time for osteonecrosis of the joint, though this is a possibility that can be further explored with hip MRI down the line if needed.  I think most likely that she has a superficial MSK issue such as trochanteric bursitis or IT band syndrome.  She can continue to follow-up with orthopedics for steroid injections if those provide her relief, but I think in the long run that continued exercises and PT will likely help her the most.  Do not think she warrants any further pain management at this time as she is already on several pain medications.  She is amenable to trialing PT.  Will follow-up at her NOV and see if her symptoms are improved.  Polypharmacy She is on many medications and despite using a pillbox, has not able to tell me which when she is taking or why she is taking them.  She did not have her meds with her today.  Several of her meds are centrally acting and have overlapping mechanisms such as her Remeron, Zyprexa, trazodone, venlafaxine and is unclear exactly what clinical benefit she is getting from these medicines.  Additionally, she is on a high dose of opioids chronically and follows with the pain management clinic and also has several other medications for pain management  but she is not sure if she is actually taking.  Advised her to bring all of her medicines that she is currently taking to her next appointment with her PCP Dr. Lafonda Mosses.  I think she would benefit significantly from reducing polypharmacy burden wherever possible and she also agrees with this.   Patient discussed with Dr.  Lafonda Mosses

## 2023-04-08 NOTE — Patient Instructions (Signed)
[name], it was a pleasure seeing you today! You endorsed feeling well today. Below are some of the things we talked about this visit. We look forward to seeing you in the follow up appointment!  Today we discussed: Hip pain: I put in a referral for physical therapy. Follow up with your orthopedic doctor and pain managmeent doctor  Diabetes: continue taking insulin as you are now  Make sure to bring your meds to your next appt  I have ordered the following labs today:   Lab Orders         Glucose, capillary         POC Hbg A1C       Referrals ordered today:    Referral Orders         Ambulatory referral to Physical Therapy      I have ordered the following medication/changed the following medications:   Stop the following medications: Medications Discontinued During This Encounter  Medication Reason   hydrOXYzine (ATARAX/VISTARIL) 25 MG tablet Patient has not taken in last 30 days   busPIRone (BUSPAR) 10 MG tablet Reorder   amLODipine (NORVASC) 10 MG tablet Reorder   irbesartan (AVAPRO) 300 MG tablet Reorder     Start the following medications: Meds ordered this encounter  Medications   amLODipine (NORVASC) 10 MG tablet    Sig: Take 1 tablet (10 mg total) by mouth daily.    Dispense:  90 tablet    Refill:  3   busPIRone (BUSPAR) 10 MG tablet    Sig: buspirone 10 mg tablet  TAKE 1 TABLET BY MOUTH THREE TIMES DAILY    Dispense:  90 tablet    Refill:  0   irbesartan (AVAPRO) 300 MG tablet    Sig: Take 1 tablet (300 mg total) by mouth daily.    Dispense:  90 tablet    Refill:  1     Follow-up:  1-2 months, establish care with PCP    Please make sure to arrive 15 minutes prior to your next appointment. If you arrive late, you may be asked to reschedule.   We look forward to seeing you next time. Please call our clinic at [phone] if you have any questions or concerns. The best time to call is Monday-Friday from 9am-4pm, but there is someone available  24/7. If after hours or the weekend, call the main hospital number and ask for the Internal Medicine Resident On-Call. If you need medication refills, please notify your pharmacy one week in advance and they will send [place] a request.  Thank you for letting us take part in your care. Wishing you the best!  Thank you, Lyndle Herrlich MD

## 2023-04-08 NOTE — Assessment & Plan Note (Signed)
Prescribed amlodipine 10 mg daily and irbesartan 300 mg daily, but she was unaware that she was prescribed 2 blood pressure medicines and unsure if she is taking both.  Blood pressure was slightly elevated in the clinic today, but no symptoms.  She was also in pain because of her right hip.  Do not think we need to make any changes right now, but can monitor.  I do think polypharmacy and confusion with medications is likely inhibiting her ability to take the right blood pressure medicines on time.

## 2023-04-08 NOTE — Assessment & Plan Note (Signed)
She is on many medications and despite using a pillbox, has not able to tell me which when she is taking or why she is taking them.  She did not have her meds with her today.  Several of her meds are centrally acting and have overlapping mechanisms such as her Remeron, Zyprexa, trazodone, venlafaxine and is unclear exactly what clinical benefit she is getting from these medicines.  Additionally, she is on a high dose of opioids chronically and follows with the pain management clinic and also has several other medications for pain management but she is not sure if she is actually taking.  Advised her to bring all of her medicines that she is currently taking to her next appointment with her PCP Dr. Lafonda Mosses.  I think she would benefit significantly from reducing polypharmacy burden wherever possible and she also agrees with this.

## 2023-04-08 NOTE — Assessment & Plan Note (Signed)
>>  ASSESSMENT AND PLAN FOR RIGHT HIP PAIN WRITTEN ON 04/08/2023  4:23 PM BY Lyndle Herrlich, MD  She has chronic right hip pain and lower back pain which she says is really bothering her.  She describes a sore aching along her right hip as well as her right lateral thigh which is worse when she stays still.  She had no preceding trauma or falls.  Actually improves with activity.  She is sees a pain management clinic and is on both buprenorphine weekly patch as well as oxycodone 15 mg every 2 days as needed.  She is also prescribed multiple other pain management options including Lidoderm, Voltaren, and over-the-counter medications.  She has had workup including right hip x-ray which was unremarkable as well as lumbar spine MRI in 2022 which only showed mild degenerative changes and spondylolysis.  She follows with orthopedics and they have tried steroid injections, most recently about 3 months ago with provided some temporary relief.  Exam is benign with only minimal tenderness to palpation over the right hip and proximal right lower extremity, full range of motion and strength, no sensory deficits or effusion.  Unsure exactly what the etiology of her hip pain is.  Her prior imaging is not consistent with severe OA of the right hip.  Her pain is not sciatic in nature and her spine imaging is also benign.  Low suspicion at this time for osteonecrosis of the joint, though this is a possibility that can be further explored with hip MRI down the line if needed.  I think most likely that she has a superficial MSK issue such as trochanteric bursitis or IT band syndrome.  She can continue to follow-up with orthopedics for steroid injections if those provide her relief, but I think in the long run that continued exercises and PT will likely help her the most.  Do not think she warrants any further pain management at this time as she is already on several pain medications.  She is amenable to trialing PT.  Will  follow-up at her NOV and see if her symptoms are improved.

## 2023-04-11 MED ORDER — BUSPIRONE HCL 10 MG PO TABS: ORAL_TABLET | ORAL | 0 refills | Status: DC

## 2023-04-11 NOTE — Addendum Note (Signed)
Addended byLyndle Herrlich on: 04/11/2023 08:47 AM   Modules accepted: Orders

## 2023-04-14 NOTE — Progress Notes (Signed)
Internal Medicine Clinic Attending  Case discussed with Dr. Marjie Skiff  At the time of the visit.  We reviewed the resident's history and exam and pertinent patient test results.  I agree with the assessment, diagnosis, and plan of care documented in the resident's note.    I share Dr. Cameron Sprang concern about polypharmacy - I am patient's PCP, and we've scheduled follow up for her to see me next month to address fully.

## 2023-05-03 DIAGNOSIS — I2510 Atherosclerotic heart disease of native coronary artery without angina pectoris: Secondary | ICD-10-CM | POA: Insufficient documentation

## 2023-05-03 NOTE — Progress Notes (Unsigned)
[facility]: Clinic Note  Subjective:  History of Present Illness: Susan Holmes is a 67 y.o. year old female who presents for 1 month follow up. She saw Dr. Consuelo Holmes on 4/12, and I precepted this case with him.   The 2 things we addressed were her ongoing R hip pain and her polypharmacy.  Her right hip pain is chronic for years. It is worse with lying in her right side, it wakes her up in the middle of the night when she lies on her right. No preceding trauma. She does have low back pain that sometimes radiates down her right side, but she feels the hip pain is different. Pain is laterally located. Gets minimal relief with her chronic buprenorphone patch, PO oxycodone, and celebrex, but did get some relief after steroid injection in December.  Please refer to Assessment and Plan below for full details in Problem-Based Charting.   Past Medical History:  Patient Active Problem List   Diagnosis Date Noted   Coronary artery disease 05/03/2023   Osteopenia 05/17/2022   Personal history of colonic polyps 05/17/2022   Lumbar spondylosis 06/05/2021   Delayed gastric emptying 12/31/2020   Erosive gastropathy 12/31/2020   Sensorineural hearing loss (SNHL), bilateral 10/23/2020   Polypharmacy 09/09/2020   Peripheral arterial disease (HCC) 10/22/2019   Type 2 diabetes mellitus (HCC) 10/04/2019   Reactive airway disease 06/20/2018   Unintentional weight loss 06/14/2018   History of CVA (cerebrovascular accident) 07/23/2016   Trochanteric bursitis of right hip 07/04/2015   Osteoarthritis 12/11/2014   Liver hemangioma 04/04/2014   Hyperparathyroidism (HCC) 03/08/2013   Calcific tendinitis of right shoulder 02/26/2012   TOBACCO ABUSE 06/12/2007   Depression 06/12/2007   CONGENITAL BLINDNESS, LEFT EYE 06/12/2007   Essential hypertension 06/12/2007     Medications:  Current Outpatient Medications:    amLODipine (NORVASC) 10 MG tablet, Take 1 tablet (10 mg total) by mouth  daily., Disp: 90 tablet, Rfl: 3   aspirin EC 81 MG tablet, Take 1 tablet (81 mg total) by mouth daily., Disp: 90 tablet, Rfl: 1   Blood Glucose Monitoring Suppl (ONETOUCH VERIO) w/Device KIT, Use as directed, Disp: 1 kit, Rfl: 0   buprenorphine (BUTRANS) 10 MCG/HR PTWK, 1 patch once a week., Disp: , Rfl:    busPIRone (BUSPAR) 10 MG tablet, Take 1 tablet (10 mg total) by mouth daily. buspirone 10 mg tablet  TAKE 1 TABLET BY MOUTH THREE TIMES DAILY, Disp: 90 tablet, Rfl: 0   celecoxib (CELEBREX) 50 MG capsule, Take 50 mg by mouth 2 (two) times daily., Disp: , Rfl:    diclofenac Sodium (VOLTAREN) 1 % GEL, Apply 4 g topically 3 (three) times daily as needed (pain)., Disp: 150 g, Rfl: 2   Dulaglutide (TRULICITY) 3 MG/0.5ML SOPN, Inject 3 mg as directed once a week., Disp: 2 mL, Rfl: 5   glucose blood (ONETOUCH VERIO) test strip, Use as instructed, Disp: 100 each, Rfl: 12   insulin glargine (LANTUS SOLOSTAR) 100 UNIT/ML Solostar Pen, Inject 40 Units into the skin at bedtime., Disp: 15 mL, Rfl: 8   Insulin Pen Needle 32G X 4 MM MISC, Use to inject insulin 4 times a day, Disp: 360 each, Rfl: 3   irbesartan (AVAPRO) 300 MG tablet, Take 1 tablet (300 mg total) by mouth daily., Disp: 90 tablet, Rfl: 1   lidocaine (LIDODERM) 5 %, 1 Patch(s) Topical, Disp: , Rfl:    LINZESS 290 MCG CAPS capsule, Take 290 mcg by mouth daily as  needed., Disp: , Rfl:    metFORMIN (GLUCOPHAGE XR) 500 MG 24 hr tablet, Take 1 tablet (500 mg total) by mouth 2 (two) times daily with a meal., Disp: 180 tablet, Rfl: 3   mirtazapine (REMERON) 45 MG tablet, Take 1 tablet (45 mg total) by mouth at bedtime., Disp: 30 tablet, Rfl: 1   NARCAN 4 MG/0.1ML LIQD nasal spray kit, Place 1 spray into the nose once., Disp: , Rfl:    OLANZapine (ZYPREXA) 5 MG tablet, Take 1 tablet (5 mg total) by mouth at bedtime., Disp: , Rfl:    OneTouch Delica Lancets 33G MISC, Use with one touch Delica lancing device and Accu chek Guideme meter and Accu Chek guide  strips to check your blood sugar up to 3 times a day, Disp: 100 each, Rfl: 11   oxyCODONE (ROXICODONE) 15 MG immediate release tablet, Take 15 mg by mouth every other day as needed for pain. , Disp: , Rfl:    ramelteon (ROZEREM) 8 MG tablet, Take 8 mg by mouth at bedtime., Disp: , Rfl:    rosuvastatin (CRESTOR) 20 MG tablet, TAKE 1 TABLET(20 MG) BY MOUTH AT BEDTIME, Disp: 90 tablet, Rfl: 2   traZODone (DESYREL) 100 MG tablet, Take 300 mg by mouth at bedtime., Disp: , Rfl:    venlafaxine XR (EFFEXOR-XR) 75 MG 24 hr capsule, Take 75 mg by mouth every morning., Disp: , Rfl:    Allergies: Allergies  Allergen Reactions   Ace Inhibitors Cough   Lipitor [Atorvastatin] Other (See Comments)    MYALGIA    Chantix [Varenicline] Nausea And Vomiting     Objective:   Vitals: Vitals:   05/04/23 1009  BP: 132/76  Pulse: 100  Temp: 97.9 F (36.6 C)  SpO2: 99%    Physical Exam: Physical Exam Constitutional:      Appearance: Normal appearance.  Pulmonary:     Effort: Pulmonary effort is normal. No respiratory distress.  Neurological:     Mental Status: She is alert.  Psychiatric:        Mood and Affect: Mood normal.        Behavior: Behavior normal.      Data: Labs, imaging, and micro were reviewed in Epic. Refer to Assessment and Plan below for full details in Problem-Based Charting.  Assessment & Plan:  Trochanteric bursitis of right hip - Chronic and worsening - Patient saw Dr. August Saucer with [name] in 11/20[facility], who thought this was right trochanteric bursitis, not referred pain from back and not right hip O/A (none seen on imaging). I agree with this assessment. I did consider if this could be AVF on R hip, but she had similar pain in her left hip, which was imaged with MRI in 2017, and this was normal. If pain persists, I'd consider MRI R hip - Patient instructed to call Dr. Diamantina [facility] office to set up another steroid injection, which was helpful in the past - Pain is managed by chronic pain  management specialist. They have her on a buprenorphine patch, celebrex 50mg  BID, oxycodone 15mg  every other day as needed for pain, and lidocaine patches  Depression - Patient follows with Dallas Breeding NP at [facility], where she is prescribed Buspar 10mg  daily, Olanzapine 5mg  at bedtime, Ramelteon 8mg  at bedtime, Trazodone 300mg  at bedtime, and Effexor 75mg  daily  - She would like to be on less medicine, and I agree that she's on a lot of centrally acting agents. I recommended that she discuss this with Merlyn Albert at their upcoming appointment  later this month, and I gave her a list of what medicines specifically to discuss in her wrap up   Essential hypertension - Well controlled today - Continue Amlodipine 10mg  daily and Irbesartan 300mg  daily   Hyperparathyroidism (HCC) - BMP today - PTH was 109 in 2014  History of CVA (cerebrovascular accident) - Continue aspirin 81mg  daily & rosuvastatin 20mg  daily   Type 2 diabetes mellitus (HCC) - A1C 5.5 - She is no longer taking short acting insulin, so I stopped this from her medicine list - Continue Metformin 500mg  BID, Trulicity 3mg  weekly, and Lantus 40 units daily. I suspect we can come down on the Lantus, but I'd like to see her glucometer readings first      Patient will follow up in 6 weeks. At that visit, continue discussions of polypharmacy - I'm hoping she will be on less centrally acting medicines then. Will check A1C, review glucose, and consider decreasing insulin dose.  Mercie Eon, MD

## 2023-05-04 ENCOUNTER — Ambulatory Visit (INDEPENDENT_AMBULATORY_CARE_PROVIDER_SITE_OTHER): Payer: 59 | Admitting: Internal Medicine

## 2023-05-04 ENCOUNTER — Encounter: Payer: Self-pay | Admitting: Internal Medicine

## 2023-05-04 VITALS — BP 132/76 | HR 100 | Temp 97.9°F | Ht 67.0 in | Wt 157.9 lb

## 2023-05-04 DIAGNOSIS — F32A Depression, unspecified: Secondary | ICD-10-CM | POA: Diagnosis not present

## 2023-05-04 DIAGNOSIS — E1165 Type 2 diabetes mellitus with hyperglycemia: Secondary | ICD-10-CM

## 2023-05-04 DIAGNOSIS — Z7984 Long term (current) use of oral hypoglycemic drugs: Secondary | ICD-10-CM

## 2023-05-04 DIAGNOSIS — E213 Hyperparathyroidism, unspecified: Secondary | ICD-10-CM

## 2023-05-04 DIAGNOSIS — I10 Essential (primary) hypertension: Secondary | ICD-10-CM

## 2023-05-04 DIAGNOSIS — Z8673 Personal history of transient ischemic attack (TIA), and cerebral infarction without residual deficits: Secondary | ICD-10-CM

## 2023-05-04 DIAGNOSIS — M7061 Trochanteric bursitis, right hip: Secondary | ICD-10-CM | POA: Diagnosis not present

## 2023-05-04 DIAGNOSIS — E119 Type 2 diabetes mellitus without complications: Secondary | ICD-10-CM

## 2023-05-04 MED ORDER — BUSPIRONE HCL 10 MG PO TABS: 10.0000 mg | ORAL_TABLET | Freq: Every day | ORAL | 0 refills | Status: DC

## 2023-05-04 MED ORDER — OLANZAPINE 5 MG PO TABS: 5.0000 mg | ORAL_TABLET | Freq: Every day | ORAL | Status: AC

## 2023-05-04 MED ORDER — ONETOUCH DELICA LANCETS 33G MISC
11 refills | Status: AC
Start: 2023-05-04 — End: ?

## 2023-05-04 NOTE — Assessment & Plan Note (Signed)
-   BMP today - PTH was 109 in 2014

## 2023-05-04 NOTE — Patient Instructions (Addendum)
Dear Ms Crooke,  It was a pleasure meeting you today.   We focused on updating your medicines and getting rid of some you don't need. I didn't make any changes to your mental health medicines, but you should discuss this with Merlyn Albert at your upcoming appointment.  These are your medicines prescribed by Merlyn Albert for your mental health. - Mirtazapine - Venlafaxine - Olanzapine - Ramelteon - Trazodone - Buspirone At your next appointment with Merlyn Albert, please bring in your medicines like you did today with me. Please discuss with Merlyn Albert if all 6 medicines for your mental health are needed. They might all be needed, but I'm not sure.    For your hip pain, since the steroid injection helped, I recommend you call them to schedule another one. Here is the contact info for Dr. Diamantina [facility] office:  [facility] [address] [place], [place] [address] [phone]  I'll see you back in 6 weeks to continue trying to come off some of your medicines.  Sincerely, Dr. Mercie Eon

## 2023-05-04 NOTE — Addendum Note (Signed)
Addended by: Derrek Monaco on: 05/04/2023 11:30 AM   Modules accepted: Level of Service

## 2023-05-04 NOTE — Assessment & Plan Note (Signed)
-   Continue aspirin 81mg  daily & rosuvastatin 20mg  daily

## 2023-05-04 NOTE — Assessment & Plan Note (Signed)
-   A1C 5.5 - She is no longer taking short acting insulin, so I stopped this from her medicine list - Continue Metformin 500mg  BID, Trulicity 3mg  weekly, and Lantus 40 units daily. I suspect we can come down on the Lantus, but I'd like to see her glucometer readings first

## 2023-05-04 NOTE — Assessment & Plan Note (Signed)
-   Patient follows with Dallas Breeding NP at [facility], where she is prescribed Buspar 10mg  daily, Olanzapine 5mg  at bedtime, Ramelteon 8mg  at bedtime, Trazodone 300mg  at bedtime, and Effexor 75mg  daily  - She would like to be on less medicine, and I agree that she's on a lot of centrally acting agents. I recommended that she discuss this with Merlyn Albert at their upcoming appointment later this month, and I gave her a list of what medicines specifically to discuss in her wrap up

## 2023-05-04 NOTE — Assessment & Plan Note (Addendum)
-   Chronic and worsening - Patient saw Dr. August Saucer with [name] in 11/2022, who thought this was right trochanteric bursitis, not referred pain from back and not right hip O/A (none seen on imaging). I agree with this assessment. I did consider if this could be AVF on R hip, but she had similar pain in her left hip, which was imaged with MRI in 2017, and this was normal. If pain persists, I'd consider MRI R hip - Patient instructed to call Dr. Diamantina Providence office to set up another steroid injection, which was helpful in the past - Pain is managed by chronic pain management specialist. They have her on a buprenorphine patch, celebrex 50mg  BID, oxycodone 15mg  every other day as needed for pain, and lidocaine patches

## 2023-05-04 NOTE — Assessment & Plan Note (Signed)
-   Well controlled today - Continue Amlodipine 10mg  daily and Irbesartan 300mg  daily

## 2023-05-05 LAB — BMP8+ANION GAP
Anion Gap: 16 mmol/L (ref 10.0–18.0)
BUN/Creatinine Ratio: 12 (ref 12–28)
BUN: 10 mg/dL (ref 8–27)
CO2: 23 mmol/L (ref 20–29)
Calcium: 10 mg/dL (ref 8.7–10.3)
Chloride: 104 mmol/L (ref 96–106)
Creatinine, Ser: 0.81 mg/dL (ref 0.57–1.00)
Glucose: 159 mg/dL — ABNORMAL HIGH (ref 70–99)
Potassium: 4.7 mmol/L (ref 3.5–5.2)
Sodium: 143 mmol/L (ref 134–144)
eGFR: 79 mL/min/{1.73_m2}

## 2023-05-05 NOTE — Progress Notes (Signed)
Called patient to review lab results, but no answer x2 and VM not set up. All labs are normal - no action needed.

## 2023-06-15 ENCOUNTER — Encounter: Payer: 59 | Admitting: Student

## 2023-06-16 ENCOUNTER — Encounter: Payer: 59 | Admitting: Student

## 2023-06-20 ENCOUNTER — Ambulatory Visit (INDEPENDENT_AMBULATORY_CARE_PROVIDER_SITE_OTHER): Payer: 59 | Admitting: Podiatry

## 2023-06-20 ENCOUNTER — Encounter: Payer: Self-pay | Admitting: Podiatry

## 2023-06-20 DIAGNOSIS — M79674 Pain in right toe(s): Secondary | ICD-10-CM | POA: Diagnosis not present

## 2023-06-20 DIAGNOSIS — M79675 Pain in left toe(s): Secondary | ICD-10-CM | POA: Diagnosis not present

## 2023-06-20 DIAGNOSIS — B351 Tinea unguium: Secondary | ICD-10-CM | POA: Diagnosis not present

## 2023-06-20 DIAGNOSIS — E1151 Type 2 diabetes mellitus with diabetic peripheral angiopathy without gangrene: Secondary | ICD-10-CM | POA: Diagnosis not present

## 2023-06-20 DIAGNOSIS — L84 Corns and callosities: Secondary | ICD-10-CM | POA: Diagnosis not present

## 2023-06-20 NOTE — Progress Notes (Signed)
This patient returns to my office for at risk foot care.  This patient requires this care by a professional since this patient will be at risk due to having diabetes.  This patient is unable to cut nails himself since the patient cannot reach his nails.These nails are painful walking and wearing shoes.  This patient presents for at risk foot care today.  General Appearance  Alert, conversant and in no acute stress.  Vascular  Dorsalis pedis and posterior tibial  pulses are palpable  bilaterally.  Capillary return is within normal limits  bilaterally. Temperature is within normal limits  bilaterally.  Neurologic  Senn-Weinstein monofilament wire test within normal limits  bilaterally. Muscle power within normal limits bilaterally.  Nails Thick disfigured discolored nails with subungual debris  from hallux to fifth toes bilaterally. No evidence of bacterial infection or drainage bilaterally.  Orthopedic  No limitations of motion  feet .  No crepitus or effusions noted.  No bony pathology or digital deformities noted.  Skin  normotropic skin noted bilaterally.  No signs of infections or ulcers noted.   Callus sub 2 right foot.  Onychomycosis  Pain in right toes  Pain in left toes   Porokeratosis right forefoot.  Consent was obtained for treatment procedures.   Mechanical debridement of nails 1-5  bilaterally performed with a nail nipper.  Filed with dremel without incident. Debride porokeratosis with # 15 blade and dremel tool.   Return office visit    3 months                  Told patient to return for periodic foot care and evaluation due to potential at risk complications.   Javari Bufkin DPM   

## 2023-07-15 ENCOUNTER — Other Ambulatory Visit: Payer: Self-pay

## 2023-07-15 DIAGNOSIS — E119 Type 2 diabetes mellitus without complications: Secondary | ICD-10-CM

## 2023-07-15 MED ORDER — LANTUS SOLOSTAR 100 UNIT/ML ~~LOC~~ SOPN: 40.0000 [IU] | PEN_INJECTOR | Freq: Every day | SUBCUTANEOUS | Status: DC

## 2023-07-19 NOTE — Telephone Encounter (Signed)
Patient calling back stating she is completely out of Lantus. This is listed as Historical on med list. Confirmed she takes 40 units at bedtime.

## 2023-07-19 NOTE — Addendum Note (Signed)
Addended by: Fredderick Severance on: 07/19/2023 02:50 PM   Modules accepted: Orders

## 2023-07-20 MED ORDER — LANTUS SOLOSTAR 100 UNIT/ML ~~LOC~~ SOPN: 40.0000 [IU] | PEN_INJECTOR | Freq: Every day | SUBCUTANEOUS | 3 refills | Status: DC

## 2023-08-01 ENCOUNTER — Other Ambulatory Visit: Payer: Self-pay

## 2023-08-01 DIAGNOSIS — G894 Chronic pain syndrome: Secondary | ICD-10-CM | POA: Diagnosis not present

## 2023-08-01 DIAGNOSIS — K5909 Other constipation: Secondary | ICD-10-CM | POA: Diagnosis not present

## 2023-08-01 DIAGNOSIS — I10 Essential (primary) hypertension: Secondary | ICD-10-CM | POA: Diagnosis not present

## 2023-08-01 DIAGNOSIS — Z9181 History of falling: Secondary | ICD-10-CM | POA: Diagnosis not present

## 2023-08-01 DIAGNOSIS — E559 Vitamin D deficiency, unspecified: Secondary | ICD-10-CM | POA: Diagnosis not present

## 2023-08-01 DIAGNOSIS — F17210 Nicotine dependence, cigarettes, uncomplicated: Secondary | ICD-10-CM | POA: Diagnosis not present

## 2023-08-01 DIAGNOSIS — E119 Type 2 diabetes mellitus without complications: Secondary | ICD-10-CM | POA: Diagnosis not present

## 2023-08-01 DIAGNOSIS — Z79899 Other long term (current) drug therapy: Secondary | ICD-10-CM | POA: Diagnosis not present

## 2023-08-01 DIAGNOSIS — M5416 Radiculopathy, lumbar region: Secondary | ICD-10-CM | POA: Diagnosis not present

## 2023-08-01 MED ORDER — TRULICITY 3 MG/0.5ML ~~LOC~~ SOAJ: 3.0000 mg | SUBCUTANEOUS | 5 refills | Status: DC

## 2023-08-03 DIAGNOSIS — Z79899 Other long term (current) drug therapy: Secondary | ICD-10-CM | POA: Diagnosis not present

## 2023-08-19 DIAGNOSIS — H30041 Focal chorioretinal inflammation, macular or paramacular, right eye: Secondary | ICD-10-CM | POA: Diagnosis not present

## 2023-08-19 DIAGNOSIS — H35351 Cystoid macular degeneration, right eye: Secondary | ICD-10-CM | POA: Diagnosis not present

## 2023-08-19 DIAGNOSIS — H33191 Other retinoschisis and retinal cysts, right eye: Secondary | ICD-10-CM | POA: Diagnosis not present

## 2023-08-19 DIAGNOSIS — H401113 Primary open-angle glaucoma, right eye, severe stage: Secondary | ICD-10-CM | POA: Diagnosis not present

## 2023-08-19 DIAGNOSIS — H401122 Primary open-angle glaucoma, left eye, moderate stage: Secondary | ICD-10-CM | POA: Diagnosis not present

## 2023-08-31 ENCOUNTER — Encounter: Payer: 59 | Admitting: Internal Medicine

## 2023-08-31 DIAGNOSIS — E119 Type 2 diabetes mellitus without complications: Secondary | ICD-10-CM | POA: Diagnosis not present

## 2023-08-31 DIAGNOSIS — Z79899 Other long term (current) drug therapy: Secondary | ICD-10-CM | POA: Diagnosis not present

## 2023-08-31 DIAGNOSIS — M5416 Radiculopathy, lumbar region: Secondary | ICD-10-CM | POA: Diagnosis not present

## 2023-08-31 DIAGNOSIS — E559 Vitamin D deficiency, unspecified: Secondary | ICD-10-CM | POA: Diagnosis not present

## 2023-08-31 DIAGNOSIS — Z9181 History of falling: Secondary | ICD-10-CM | POA: Diagnosis not present

## 2023-08-31 DIAGNOSIS — I10 Essential (primary) hypertension: Secondary | ICD-10-CM | POA: Diagnosis not present

## 2023-08-31 DIAGNOSIS — K5909 Other constipation: Secondary | ICD-10-CM | POA: Diagnosis not present

## 2023-08-31 DIAGNOSIS — F17210 Nicotine dependence, cigarettes, uncomplicated: Secondary | ICD-10-CM | POA: Diagnosis not present

## 2023-09-02 DIAGNOSIS — E119 Type 2 diabetes mellitus without complications: Secondary | ICD-10-CM | POA: Diagnosis not present

## 2023-09-02 DIAGNOSIS — Z961 Presence of intraocular lens: Secondary | ICD-10-CM | POA: Diagnosis not present

## 2023-09-02 DIAGNOSIS — H04123 Dry eye syndrome of bilateral lacrimal glands: Secondary | ICD-10-CM | POA: Diagnosis not present

## 2023-09-02 DIAGNOSIS — H401113 Primary open-angle glaucoma, right eye, severe stage: Secondary | ICD-10-CM | POA: Diagnosis not present

## 2023-09-02 DIAGNOSIS — H3581 Retinal edema: Secondary | ICD-10-CM | POA: Diagnosis not present

## 2023-09-02 DIAGNOSIS — H53022 Refractive amblyopia, left eye: Secondary | ICD-10-CM | POA: Diagnosis not present

## 2023-09-02 DIAGNOSIS — Z79899 Other long term (current) drug therapy: Secondary | ICD-10-CM | POA: Diagnosis not present

## 2023-09-02 DIAGNOSIS — H401122 Primary open-angle glaucoma, left eye, moderate stage: Secondary | ICD-10-CM | POA: Diagnosis not present

## 2023-09-05 ENCOUNTER — Other Ambulatory Visit: Payer: Self-pay

## 2023-09-05 MED ORDER — TRULICITY 3 MG/0.5ML ~~LOC~~ SOAJ: 3.0000 mg | SUBCUTANEOUS | 5 refills | Status: DC

## 2023-09-21 ENCOUNTER — Ambulatory Visit (INDEPENDENT_AMBULATORY_CARE_PROVIDER_SITE_OTHER): Payer: 59 | Admitting: Podiatry

## 2023-09-21 ENCOUNTER — Encounter: Payer: Self-pay | Admitting: Podiatry

## 2023-09-21 DIAGNOSIS — M79675 Pain in left toe(s): Secondary | ICD-10-CM | POA: Diagnosis not present

## 2023-09-21 DIAGNOSIS — E1151 Type 2 diabetes mellitus with diabetic peripheral angiopathy without gangrene: Secondary | ICD-10-CM

## 2023-09-21 DIAGNOSIS — B351 Tinea unguium: Secondary | ICD-10-CM | POA: Diagnosis not present

## 2023-09-21 DIAGNOSIS — M79674 Pain in right toe(s): Secondary | ICD-10-CM | POA: Diagnosis not present

## 2023-09-21 DIAGNOSIS — L84 Corns and callosities: Secondary | ICD-10-CM

## 2023-09-21 NOTE — Progress Notes (Signed)
This patient returns to my office for at risk foot care.  This patient requires this care by a professional since this patient will be at risk due to having diabetes.  This patient is unable to cut nails himself since the patient cannot reach his nails.These nails are painful walking and wearing shoes.  This patient presents for at risk foot care today.  General Appearance  Alert, conversant and in no acute stress.  Vascular  Dorsalis pedis and posterior tibial  pulses are palpable  bilaterally.  Capillary return is within normal limits  bilaterally. Temperature is within normal limits  bilaterally.  Neurologic  Senn-Weinstein monofilament wire test within normal limits  bilaterally. Muscle power within normal limits bilaterally.  Nails Thick disfigured discolored nails with subungual debris  from hallux to fifth toes bilaterally. No evidence of bacterial infection or drainage bilaterally.  Orthopedic  No limitations of motion  feet .  No crepitus or effusions noted.  No bony pathology or digital deformities noted.  Skin  normotropic skin noted bilaterally.  No signs of infections or ulcers noted.   Callus sub 2 right foot.  Onychomycosis  Pain in right toes  Pain in left toes   Porokeratosis right forefoot.  Consent was obtained for treatment procedures.   Mechanical debridement of nails 1-5  bilaterally performed with a nail nipper.  Filed with dremel without incident. Debride porokeratosis with # 15 blade and dremel tool.   Return office visit    3 months                  Told patient to return for periodic foot care and evaluation due to potential at risk complications.   Helane Gunther DPM

## 2023-09-26 NOTE — Progress Notes (Unsigned)
[facility]: Clinic Note  Subjective:  History of Present Illness: Susan Holmes is a 67 y.o. year old female who presents for routine follow up of her chronic medical conditions. I saw her in May 2024.  Her main concern today is ongoing right hip pain. She's still following with her chronic pain specialist, no medicine changes. She has not had a recent steroid injection, which was helpful previously. No trauma. No leg weakness or falls.   She has been taking her insulin and diabetes medicines, checking sugars at night, they have been in the 400's. No low readings. No symptoms.   Please refer to Assessment and Plan below for full details in Problem-Based Charting.   Past Medical History:  Patient Active Problem List   Diagnosis Date Noted   Coronary artery disease 05/03/2023   Osteopenia 05/17/2022   History of colonic polyps 05/17/2022   Lumbar spondylosis 06/05/2021   Delayed gastric emptying 12/31/2020   Erosive gastropathy 12/31/2020   Sensorineural hearing loss (SNHL), bilateral 10/23/2020   Polypharmacy 09/09/2020   Peripheral arterial disease (HCC) 10/22/2019   Type 2 diabetes mellitus (HCC) 10/04/2019   Reactive airway disease 06/20/2018   History of CVA (cerebrovascular accident) 07/23/2016   Trochanteric bursitis of right hip 07/04/2015   Osteoarthritis 12/11/2014   Calcific tendinitis of right shoulder 02/26/2012   TOBACCO ABUSE 06/12/2007   Depression 06/12/2007   Essential hypertension 06/12/2007      Medications:  Current Outpatient Medications:    amLODipine (NORVASC) 10 MG tablet, Take 1 tablet (10 mg total) by mouth daily., Disp: 90 tablet, Rfl: 3   aspirin EC 81 MG tablet, Take 1 tablet (81 mg total) by mouth daily., Disp: 90 tablet, Rfl: 1   Blood Glucose Monitoring Suppl (ONETOUCH VERIO) w/Device KIT, Use as directed, Disp: 1 kit, Rfl: 0   buprenorphine (BUTRANS) 10 MCG/HR PTWK, 1 patch once a week., Disp: , Rfl:    busPIRone  (BUSPAR) 10 MG tablet, Take 1 tablet (10 mg total) by mouth daily. buspirone 10 mg tablet  TAKE 1 TABLET BY MOUTH THREE TIMES DAILY, Disp: 90 tablet, Rfl: 0   celecoxib (CELEBREX) 50 MG capsule, Take 50 mg by mouth 2 (two) times daily., Disp: , Rfl:    diclofenac Sodium (VOLTAREN) 1 % GEL, Apply 4 g topically 3 (three) times daily as needed (pain)., Disp: 150 g, Rfl: 2   Dulaglutide (TRULICITY) 3 MG/0.5ML SOPN, Inject 3 mg as directed once a week., Disp: 2 mL, Rfl: 5   glucose blood (ONETOUCH VERIO) test strip, Use as instructed, Disp: 100 each, Rfl: 12   insulin glargine (LANTUS SOLOSTAR) 100 UNIT/ML Solostar Pen, Inject 44 Units into the skin at bedtime., Disp: 15 mL, Rfl: 3   Insulin Pen Needle 32G X 4 MM MISC, Use to inject insulin 4 times a day, Disp: 360 each, Rfl: 3   irbesartan (AVAPRO) 300 MG tablet, Take 1 tablet (300 mg total) by mouth daily., Disp: 90 tablet, Rfl: 1   lidocaine (LIDODERM) 5 %, 1 Patch(s) Topical, Disp: , Rfl:    LINZESS 290 MCG CAPS capsule, Take 290 mcg by mouth daily as needed., Disp: , Rfl:    metFORMIN (GLUCOPHAGE XR) 500 MG 24 hr tablet, Take 1 tablet (500 mg total) by mouth 2 (two) times daily with a meal., Disp: 180 tablet, Rfl: 3   mirtazapine (REMERON) 45 MG tablet, Take 1 tablet (45 mg total) by mouth at bedtime., Disp: 30 tablet, Rfl: 1  NARCAN 4 MG/0.1ML LIQD nasal spray kit, Place 1 spray into the nose once., Disp: , Rfl:    OLANZapine (ZYPREXA) 5 MG tablet, Take 1 tablet (5 mg total) by mouth at bedtime., Disp: , Rfl:    OneTouch Delica Lancets 33G MISC, Use with one touch Delica lancing device and Accu chek Guideme meter and Accu Chek guide strips to check your blood sugar up to 3 times a day, Disp: 100 each, Rfl: 11   oxyCODONE (ROXICODONE) 15 MG immediate release tablet, Take 15 mg by mouth every other day as needed for pain. , Disp: , Rfl:    ramelteon (ROZEREM) 8 MG tablet, Take 8 mg by mouth at bedtime., Disp: , Rfl:    rosuvastatin (CRESTOR) 20 MG  tablet, TAKE 1 TABLET(20 MG) BY MOUTH AT BEDTIME, Disp: 90 tablet, Rfl: 2   traZODone (DESYREL) 100 MG tablet, Take 300 mg by mouth at bedtime., Disp: , Rfl:    venlafaxine XR (EFFEXOR-XR) 75 MG 24 hr capsule, Take 75 mg by mouth every morning., Disp: , Rfl:    Allergies: Allergies  Allergen Reactions   Ace Inhibitors Cough   Lipitor [Atorvastatin] Other (See Comments)    MYALGIA    Chantix [Varenicline] Nausea And Vomiting       Objective:   Vitals: Vitals:   09/28/23 0952  BP: 122/75  Pulse: 87  Temp: 97.9 F (36.6 C)  SpO2: 100%     Physical Exam: Physical Exam Constitutional:      Appearance: Normal appearance.  Cardiovascular:     Rate and Rhythm: Normal rate and regular rhythm.  Pulmonary:     Effort: Pulmonary effort is normal.     Breath sounds: Normal breath sounds.  Musculoskeletal:     Right lower leg: No edema.     Left lower leg: No edema.  Neurological:     Mental Status: She is alert.      Data: Labs, imaging, and micro were reviewed in Epic. Refer to Assessment and Plan below for full details in Problem-Based Charting.  Assessment & Plan:  TOBACCO ABUSE - Smoking 6-7 cigarettes daily - Not interested in cutting back or quitting today - I encouraged her to quit  Depression - Patient follows with Dallas Breeding NP at [facility], where she is prescribed Buspar 10mg  daily, Olanzapine 5mg  at bedtime, Ramelteon 8mg  at bedtime, Trazodone 300mg  at bedtime, and Effexor 75mg  daily  - We have discussed that she's on lots of centrally acting medicines  Essential hypertension Well controlled today - Continue Amlodipine 10mg  daily and Irbesartan 300mg  daily  History of CVA (cerebrovascular accident) - chronic & stable - continue aspirin 81mg  daily & rosuvastatin 20mg  daily   Trochanteric bursitis of right hip - Chronic and worsening - Patient saw Dr. August Saucer with [name] in 11/2022, who thought this was right trochanteric bursitis, not referred pain  from back and not right hip O/A (none seen on imaging). I agree with this assessment. I did consider if this could be AVF on R hip, but she had similar pain in her left hip, which was imaged with MRI in 2017, and this was normal. If pain persists, I'd consider MRI R hip - Patient instructed to call Dr. Diamantina [facility] office to set up another steroid injection, which was helpful in the past - Pain is managed by chronic pain management specialist. They have her on a buprenorphine patch, celebrex 50mg  BID, oxycodone 15mg  every other day as needed for pain, and lidocaine patches  Type 2  diabetes mellitus (HCC) - this is my main concern today - A1C is up to 10.9 from 5.5 on last check - Stop eating sugary foods at night - Increase Lantus by 10% from 40 units to 44 units daily. I'm hesitant to make a bigger change without clear data - Check sugars at least twice daily - once in the morning fasting and once post prandial. I recommended CGM & an appointment with Lupita Leash, but patient declined this - 2 week follow up for T2DM to review glucometer data and adjust insulin dose - continue Trulicity 3mg  weekly & Metformin ER 500mg  BID - Could add SGLT2 inhibitor       Patient will follow up in 2 weeks for her uncontrolled T2DM   Mercie Eon, MD

## 2023-09-27 DIAGNOSIS — Z794 Long term (current) use of insulin: Secondary | ICD-10-CM | POA: Diagnosis not present

## 2023-09-27 DIAGNOSIS — E1140 Type 2 diabetes mellitus with diabetic neuropathy, unspecified: Secondary | ICD-10-CM | POA: Diagnosis not present

## 2023-09-27 DIAGNOSIS — Z9181 History of falling: Secondary | ICD-10-CM | POA: Diagnosis not present

## 2023-09-27 DIAGNOSIS — Z79899 Other long term (current) drug therapy: Secondary | ICD-10-CM | POA: Diagnosis not present

## 2023-09-27 DIAGNOSIS — I10 Essential (primary) hypertension: Secondary | ICD-10-CM | POA: Diagnosis not present

## 2023-09-27 DIAGNOSIS — F17210 Nicotine dependence, cigarettes, uncomplicated: Secondary | ICD-10-CM | POA: Diagnosis not present

## 2023-09-27 DIAGNOSIS — E559 Vitamin D deficiency, unspecified: Secondary | ICD-10-CM | POA: Diagnosis not present

## 2023-09-27 DIAGNOSIS — K5909 Other constipation: Secondary | ICD-10-CM | POA: Diagnosis not present

## 2023-09-27 DIAGNOSIS — M5416 Radiculopathy, lumbar region: Secondary | ICD-10-CM | POA: Diagnosis not present

## 2023-09-28 ENCOUNTER — Ambulatory Visit: Payer: 59 | Admitting: Internal Medicine

## 2023-09-28 ENCOUNTER — Encounter: Payer: Self-pay | Admitting: Internal Medicine

## 2023-09-28 VITALS — BP 122/75 | HR 87 | Temp 97.9°F | Ht 67.0 in | Wt 167.7 lb

## 2023-09-28 DIAGNOSIS — F32A Depression, unspecified: Secondary | ICD-10-CM | POA: Diagnosis not present

## 2023-09-28 DIAGNOSIS — F17210 Nicotine dependence, cigarettes, uncomplicated: Secondary | ICD-10-CM | POA: Diagnosis not present

## 2023-09-28 DIAGNOSIS — Z794 Long term (current) use of insulin: Secondary | ICD-10-CM

## 2023-09-28 DIAGNOSIS — E119 Type 2 diabetes mellitus without complications: Secondary | ICD-10-CM | POA: Diagnosis not present

## 2023-09-28 DIAGNOSIS — M7061 Trochanteric bursitis, right hip: Secondary | ICD-10-CM | POA: Diagnosis not present

## 2023-09-28 DIAGNOSIS — Z7985 Long-term (current) use of injectable non-insulin antidiabetic drugs: Secondary | ICD-10-CM | POA: Diagnosis not present

## 2023-09-28 DIAGNOSIS — I10 Essential (primary) hypertension: Secondary | ICD-10-CM

## 2023-09-28 DIAGNOSIS — Z8673 Personal history of transient ischemic attack (TIA), and cerebral infarction without residual deficits: Secondary | ICD-10-CM | POA: Diagnosis not present

## 2023-09-28 DIAGNOSIS — E1165 Type 2 diabetes mellitus with hyperglycemia: Secondary | ICD-10-CM | POA: Diagnosis not present

## 2023-09-28 DIAGNOSIS — Z7984 Long term (current) use of oral hypoglycemic drugs: Secondary | ICD-10-CM

## 2023-09-28 DIAGNOSIS — F17200 Nicotine dependence, unspecified, uncomplicated: Secondary | ICD-10-CM

## 2023-09-28 LAB — GLUCOSE, CAPILLARY: Glucose-Capillary: 219 mg/dL — ABNORMAL HIGH (ref 70–99)

## 2023-09-28 LAB — POCT GLYCOSYLATED HEMOGLOBIN (HGB A1C): Hemoglobin A1C: 10.9 % — AB (ref 4.0–5.6)

## 2023-09-28 MED ORDER — LANTUS SOLOSTAR 100 UNIT/ML ~~LOC~~ SOPN
44.0000 [IU] | PEN_INJECTOR | Freq: Every day | SUBCUTANEOUS | 3 refills | Status: DC
Start: 2023-09-28 — End: 2023-12-05

## 2023-09-28 NOTE — Assessment & Plan Note (Signed)
-   chronic & stable - continue aspirin 81mg  daily & rosuvastatin 20mg  daily

## 2023-09-28 NOTE — Assessment & Plan Note (Signed)
-   this is my main concern today - A1C is up to 10.9 from 5.5 on last check - Stop eating sugary foods at night - Increase Lantus by 10% from 40 units to 44 units daily. I'm hesitant to make a bigger change without clear data - Check sugars at least twice daily - once in the morning fasting and once post prandial. I recommended CGM & an appointment with Lupita Leash, but patient declined this - 2 week follow up for T2DM to review glucometer data and adjust insulin dose - continue Trulicity 3mg  weekly & Metformin ER 500mg  BID - Could add SGLT2 inhibitor

## 2023-09-28 NOTE — Assessment & Plan Note (Signed)
-   Chronic and worsening - Patient saw Dr. August Saucer with [name] in 11/2022, who thought this was right trochanteric bursitis, not referred pain from back and not right hip O/A (none seen on imaging). I agree with this assessment. I did consider if this could be AVF on R hip, but she had similar pain in her left hip, which was imaged with MRI in 2017, and this was normal. If pain persists, I'd consider MRI R hip - Patient instructed to call Dr. Diamantina [facility] office to set up another steroid injection, which was helpful in the past - Pain is managed by chronic pain management specialist. They have her on a buprenorphine patch, celebrex 50mg  BID, oxycodone 15mg  every other day as needed for pain, and lidocaine patches

## 2023-09-28 NOTE — Assessment & Plan Note (Signed)
-   Well controlled today - Continue Amlodipine 10mg  daily and Irbesartan 300mg  daily

## 2023-09-28 NOTE — Assessment & Plan Note (Signed)
-   Smoking 6-7 cigarettes daily - Not interested in cutting back or quitting today - I encouraged her to quit

## 2023-09-28 NOTE — Patient Instructions (Addendum)
Dear Ms Susan Holmes,  Your blood sugars were really high today. Your A1C was 10.9, and I'd like this number to be less than 7.   I am going to increase your Lantus insulin from 40 units to 44 units.  Please continue taking your Trulicity and Metformin. Please check your sugars in the morning before you have anything to eat, and again in the evening. Bring your glucometer and these readings in to your next appointment.  Please work on stopping eating sugary foods, especially at nighttime.   I think a steroid injection could be helpful for your hip, but you'll need your blood sugars to be better controlled before getting a steroid injection. When your sugars are better, you can call Dr. Diamantina Providence office to schedule this:  Dr. [facility] [address] [place], [place] [address] [phone]   I'll see you back in 2 weeks to review your sugars.  Sincerely, Dr. Mercie Eon

## 2023-09-28 NOTE — Assessment & Plan Note (Signed)
-   Patient follows with Dallas Breeding NP at [facility], where she is prescribed Buspar 10mg  daily, Olanzapine 5mg  at bedtime, Ramelteon 8mg  at bedtime, Trazodone 300mg  at bedtime, and Effexor 75mg  daily  - We have discussed that she's on lots of centrally acting medicines

## 2023-09-29 DIAGNOSIS — Z79899 Other long term (current) drug therapy: Secondary | ICD-10-CM | POA: Diagnosis not present

## 2023-09-30 LAB — BMP8+ANION GAP
Anion Gap: 15 mmol/L (ref 10.0–18.0)
BUN/Creatinine Ratio: 7 — ABNORMAL LOW (ref 12–28)
BUN: 6 mg/dL — ABNORMAL LOW (ref 8–27)
CO2: 21 mmol/L (ref 20–29)
Calcium: 10.2 mg/dL (ref 8.7–10.3)
Chloride: 105 mmol/L (ref 96–106)
Creatinine, Ser: 0.85 mg/dL (ref 0.57–1.00)
Glucose: 202 mg/dL — ABNORMAL HIGH (ref 70–99)
Potassium: 4.4 mmol/L (ref 3.5–5.2)
Sodium: 141 mmol/L (ref 134–144)
eGFR: 75 mL/min/{1.73_m2} (ref >59)

## 2023-09-30 LAB — LIPID PANEL
Chol/HDL Ratio: 2 {ratio} (ref 0.0–4.4)
Cholesterol, Total: 102 mg/dL (ref 100–199)
HDL: 51 mg/dL (ref >39)
LDL Chol Calc (NIH): 38 mg/dL (ref 0–99)
Triglycerides: 56 mg/dL (ref 0–149)
VLDL Cholesterol Cal: 13 mg/dL (ref 5–40)

## 2023-10-05 ENCOUNTER — Other Ambulatory Visit: Payer: Self-pay

## 2023-10-05 DIAGNOSIS — H30041 Focal chorioretinal inflammation, macular or paramacular, right eye: Secondary | ICD-10-CM | POA: Diagnosis not present

## 2023-10-05 DIAGNOSIS — H33191 Other retinoschisis and retinal cysts, right eye: Secondary | ICD-10-CM | POA: Diagnosis not present

## 2023-10-05 DIAGNOSIS — H35351 Cystoid macular degeneration, right eye: Secondary | ICD-10-CM | POA: Diagnosis not present

## 2023-10-05 DIAGNOSIS — E119 Type 2 diabetes mellitus without complications: Secondary | ICD-10-CM

## 2023-10-05 DIAGNOSIS — H401113 Primary open-angle glaucoma, right eye, severe stage: Secondary | ICD-10-CM | POA: Diagnosis not present

## 2023-10-05 DIAGNOSIS — H401122 Primary open-angle glaucoma, left eye, moderate stage: Secondary | ICD-10-CM | POA: Diagnosis not present

## 2023-10-05 MED ORDER — INSULIN PEN NEEDLE 32G X 4 MM MISC: 3 refills | Status: DC

## 2023-10-11 NOTE — Progress Notes (Unsigned)
[facility]: Clinic Note  Subjective:  History of Present Illness: Susan Holmes is a 67 y.o. year old female who presents for 2 week follow up of her T2DM.  Type 2 diabetes mellitus (HCC) - this is my main concern today - A1C is up to 10.9 from 5.5 on last check - Stop eating sugary foods at night - Increase Lantus by 10% from 40 units to 44 units daily. I'm hesitant to make a bigger change without clear data - Check sugars at least twice daily - once in the morning fasting and once post prandial. I recommended CGM & an appointment with Lupita Leash, but patient declined this - 2 week follow up for T2DM to review glucometer data and adjust insulin dose - continue Trulicity 3mg  weekly & Metformin ER 500mg  BID - Could add SGLT2 inhibitor     TOBACCO ABUSE - Smoking 6-7 cigarettes daily - Not interested in cutting back or quitting today - I encouraged her to quit   Depression - Patient follows with Dallas Breeding NP at [facility], where she is prescribed Buspar 10mg  daily, Olanzapine 5mg  at bedtime, Ramelteon 8mg  at bedtime, Trazodone 300mg  at bedtime, and Effexor 75mg  daily  - We have discussed that she's on lots of centrally acting medicines   Essential hypertension Well controlled today - Continue Amlodipine 10mg  daily and Irbesartan 300mg  daily   History of CVA (cerebrovascular accident) - chronic & stable - continue aspirin 81mg  daily & rosuvastatin 20mg  daily    Trochanteric bursitis of right hip - Chronic and worsening - Patient saw Dr. August Saucer with [name] in 11/2022, who thought this was right trochanteric bursitis, not referred pain from back and not right hip O/A (none seen on imaging). I agree with this assessment. I did consider if this could be AVF on R hip, but she had similar pain in her left hip, which was imaged with MRI in 2017, and this was normal. If pain persists, I'd consider MRI R hip - Patient instructed to call Dr. Diamantina [facility] office to set up  another steroid injection, which was helpful in the past - Pain is managed by chronic pain management specialist. They have her on a buprenorphine patch, celebrex 50mg  BID, oxycodone 15mg  every other day as needed for pain, and lidocaine patches     Please refer to Assessment and Plan below for full details in Problem-Based Charting.   Past Medical History:  Patient Active Problem List   Diagnosis Date Noted   Coronary artery disease 05/03/2023   Osteopenia 05/17/2022   History of colonic polyps 05/17/2022   Lumbar spondylosis 06/05/2021   Delayed gastric emptying 12/31/2020   Erosive gastropathy 12/31/2020   Sensorineural hearing loss (SNHL), bilateral 10/23/2020   Polypharmacy 09/09/2020   Peripheral arterial disease (HCC) 10/22/2019   Type 2 diabetes mellitus (HCC) 10/04/2019   Reactive airway disease 06/20/2018   History of CVA (cerebrovascular accident) 07/23/2016   Trochanteric bursitis of right hip 07/04/2015   Osteoarthritis 12/11/2014   Calcific tendinitis of right shoulder 02/26/2012   TOBACCO ABUSE 06/12/2007   Depression 06/12/2007   Essential hypertension 06/12/2007      Medications:  Current Outpatient Medications:    amLODipine (NORVASC) 10 MG tablet, Take 1 tablet (10 mg total) by mouth daily., Disp: 90 tablet, Rfl: 3   aspirin EC 81 MG tablet, Take 1 tablet (81 mg total) by mouth daily., Disp: 90 tablet, Rfl: 1   Blood Glucose Monitoring Suppl (ONETOUCH VERIO) w/Device KIT, Use as directed,  Disp: 1 kit, Rfl: 0   buprenorphine (BUTRANS) 10 MCG/HR PTWK, 1 patch once a week., Disp: , Rfl:    busPIRone (BUSPAR) 10 MG tablet, Take 1 tablet (10 mg total) by mouth daily. buspirone 10 mg tablet  TAKE 1 TABLET BY MOUTH THREE TIMES DAILY, Disp: 90 tablet, Rfl: 0   celecoxib (CELEBREX) 50 MG capsule, Take 50 mg by mouth 2 (two) times daily., Disp: , Rfl:    diclofenac Sodium (VOLTAREN) 1 % GEL, Apply 4 g topically 3 (three) times daily as needed (pain)., Disp: 150 g, Rfl:  2   Dulaglutide (TRULICITY) 3 MG/0.5ML SOPN, Inject 3 mg as directed once a week., Disp: 2 mL, Rfl: 5   glucose blood (ONETOUCH VERIO) test strip, Use as instructed, Disp: 100 each, Rfl: 12   insulin glargine (LANTUS SOLOSTAR) 100 UNIT/ML Solostar Pen, Inject 44 Units into the skin at bedtime., Disp: 15 mL, Rfl: 3   Insulin Pen Needle 32G X 4 MM MISC, Use to inject insulin 4 times a day, Disp: 360 each, Rfl: 3   irbesartan (AVAPRO) 300 MG tablet, Take 1 tablet (300 mg total) by mouth daily., Disp: 90 tablet, Rfl: 1   lidocaine (LIDODERM) 5 %, 1 Patch(s) Topical, Disp: , Rfl:    LINZESS 290 MCG CAPS capsule, Take 290 mcg by mouth daily as needed., Disp: , Rfl:    metFORMIN (GLUCOPHAGE XR) 500 MG 24 hr tablet, Take 1 tablet (500 mg total) by mouth 2 (two) times daily with a meal., Disp: 180 tablet, Rfl: 3   mirtazapine (REMERON) 45 MG tablet, Take 1 tablet (45 mg total) by mouth at bedtime., Disp: 30 tablet, Rfl: 1   NARCAN 4 MG/0.1ML LIQD nasal spray kit, Place 1 spray into the nose once., Disp: , Rfl:    OLANZapine (ZYPREXA) 5 MG tablet, Take 1 tablet (5 mg total) by mouth at bedtime., Disp: , Rfl:    OneTouch Delica Lancets 33G MISC, Use with one touch Delica lancing device and Accu chek Guideme meter and Accu Chek guide strips to check your blood sugar up to 3 times a day, Disp: 100 each, Rfl: 11   oxyCODONE (ROXICODONE) 15 MG immediate release tablet, Take 15 mg by mouth every other day as needed for pain. , Disp: , Rfl:    ramelteon (ROZEREM) 8 MG tablet, Take 8 mg by mouth at bedtime., Disp: , Rfl:    rosuvastatin (CRESTOR) 20 MG tablet, TAKE 1 TABLET(20 MG) BY MOUTH AT BEDTIME, Disp: 90 tablet, Rfl: 2   traZODone (DESYREL) 100 MG tablet, Take 300 mg by mouth at bedtime., Disp: , Rfl:    venlafaxine XR (EFFEXOR-XR) 75 MG 24 hr capsule, Take 75 mg by mouth every morning., Disp: , Rfl:    Allergies: Allergies  Allergen Reactions   Ace Inhibitors Cough   Lipitor [Atorvastatin] Other (See  Comments)    MYALGIA    Chantix [Varenicline] Nausea And Vomiting       Objective:   Vitals: There were no vitals filed for this visit.   Physical Exam: Physical Exam   Data: Labs, imaging, and micro were reviewed in Epic. Refer to Assessment and Plan below for full details in Problem-Based Charting.  Assessment & Plan:  No problem-specific Assessment & Plan notes found for this encounter.     Patient will follow up in ***  Mercie Eon, MD

## 2023-10-12 ENCOUNTER — Encounter: Payer: Self-pay | Admitting: Internal Medicine

## 2023-10-12 ENCOUNTER — Ambulatory Visit: Payer: 59 | Admitting: Internal Medicine

## 2023-10-12 VITALS — BP 133/67 | HR 87 | Temp 97.7°F | Ht 67.0 in | Wt 168.0 lb

## 2023-10-12 DIAGNOSIS — E1165 Type 2 diabetes mellitus with hyperglycemia: Secondary | ICD-10-CM

## 2023-10-12 DIAGNOSIS — H30041 Focal chorioretinal inflammation, macular or paramacular, right eye: Secondary | ICD-10-CM | POA: Diagnosis not present

## 2023-10-12 DIAGNOSIS — M7061 Trochanteric bursitis, right hip: Secondary | ICD-10-CM | POA: Diagnosis not present

## 2023-10-12 DIAGNOSIS — Z794 Long term (current) use of insulin: Secondary | ICD-10-CM

## 2023-10-12 MED ORDER — INSULIN LISPRO (1 UNIT DIAL) 100 UNIT/ML (KWIKPEN): 5.0000 [IU] | PEN_INJECTOR | Freq: Two times a day (BID) | SUBCUTANEOUS | 1 refills | Status: DC

## 2023-10-12 NOTE — Assessment & Plan Note (Signed)
-   chronic and worsening - see my last note for full details - I think this could possibly be AVF on R hip, which she has had on the left, so I would like her to see Dr. August Saucer with orthopedic surgery again. She will call his office

## 2023-10-12 NOTE — Assessment & Plan Note (Addendum)
-   At last visit, A1C was up to 10.9 from 5.5.  - She has been checking glucose twice daily for the past week. Fasting sugars from 105-241 since we increased Lantus to 44 units daily at last visit. 5pm post-prandial sugars are mostly in 200s, with some in 400s even after Lantus increase - Looking back through her chart, she used to be on Lantus & Humalog, but last year, she was weaned off the Humalog because her A1C had improved so much.  - I think she needs prandial insulin. She eats 1 meal at 3pm and 1 large nighttime snack daily. Will start Humalog 5 units with meals & large snacks - I encouraged her to try to eat 3 regular meals daily - I offered an appointment with Lupita Leash, she would prefer to see me for the next visit, so I'll see her back in a month   Insulin regimen: Lantus 44 units daily Humalog 5 units BID with meals Metformin ER 500mg  BID Trulicity 3mg  weekly Not on SGLT2 yet

## 2023-10-12 NOTE — Patient Instructions (Addendum)
Dear Ms Briel,  It was a pleasure seeing you in clinic today.  Your sugars are still very high after your 3pm meal, so I recommend adding back Humalog with your meals and large snacks. I have sent the Humalog pens to your pharmacy. Take 5 units of Humalog with your meals and large snacks. Do not take Humalog if you are not eating.   Keeping taking your Lantus and your other medicines as you are now. Keep working on eating healthy snacks at night, not lots of sugary, processed food.  I'll see you back in 1 month to review your blood sugar log again. Please bring your glucometer again so we can download the readings. Keep checking your blood sugars once in the morning and once in the late afternoon.  Sincerely, Dr. Mercie Eon

## 2023-10-20 ENCOUNTER — Other Ambulatory Visit: Payer: Self-pay | Admitting: *Deleted

## 2023-10-20 DIAGNOSIS — I779 Disorder of arteries and arterioles, unspecified: Secondary | ICD-10-CM

## 2023-10-20 DIAGNOSIS — I739 Peripheral vascular disease, unspecified: Secondary | ICD-10-CM

## 2023-10-27 DIAGNOSIS — M129 Arthropathy, unspecified: Secondary | ICD-10-CM | POA: Diagnosis not present

## 2023-10-27 DIAGNOSIS — I2510 Atherosclerotic heart disease of native coronary artery without angina pectoris: Secondary | ICD-10-CM | POA: Diagnosis not present

## 2023-10-27 DIAGNOSIS — I10 Essential (primary) hypertension: Secondary | ICD-10-CM | POA: Diagnosis not present

## 2023-10-27 DIAGNOSIS — Z794 Long term (current) use of insulin: Secondary | ICD-10-CM | POA: Diagnosis not present

## 2023-10-27 DIAGNOSIS — Z79899 Other long term (current) drug therapy: Secondary | ICD-10-CM | POA: Diagnosis not present

## 2023-10-27 DIAGNOSIS — M5416 Radiculopathy, lumbar region: Secondary | ICD-10-CM | POA: Diagnosis not present

## 2023-10-27 DIAGNOSIS — F17210 Nicotine dependence, cigarettes, uncomplicated: Secondary | ICD-10-CM | POA: Diagnosis not present

## 2023-10-27 DIAGNOSIS — K5909 Other constipation: Secondary | ICD-10-CM | POA: Diagnosis not present

## 2023-10-27 DIAGNOSIS — E1140 Type 2 diabetes mellitus with diabetic neuropathy, unspecified: Secondary | ICD-10-CM | POA: Diagnosis not present

## 2023-10-27 DIAGNOSIS — Z9181 History of falling: Secondary | ICD-10-CM | POA: Diagnosis not present

## 2023-10-27 DIAGNOSIS — E559 Vitamin D deficiency, unspecified: Secondary | ICD-10-CM | POA: Diagnosis not present

## 2023-10-28 ENCOUNTER — Ambulatory Visit (HOSPITAL_COMMUNITY)
Admission: RE | Admit: 2023-10-28 | Discharge: 2023-10-28 | Disposition: A | Discharge status: Home / Self Care | Payer: 59 | Source: Ambulatory Visit | Attending: Vascular Surgery | Admitting: Vascular Surgery

## 2023-10-28 ENCOUNTER — Ambulatory Visit (HOSPITAL_COMMUNITY)
Admission: RE | Admit: 2023-10-28 | Discharge: 2023-10-28 | Disposition: A | Discharge status: Home / Self Care | Payer: 59 | Source: Ambulatory Visit | Attending: Vascular Surgery

## 2023-10-28 ENCOUNTER — Ambulatory Visit (INDEPENDENT_AMBULATORY_CARE_PROVIDER_SITE_OTHER): Payer: 59 | Admitting: Physician Assistant

## 2023-10-28 VITALS — BP 132/86 | HR 82 | Temp 98.0°F | Resp 16 | Ht 67.0 in | Wt 167.4 lb

## 2023-10-28 DIAGNOSIS — I779 Disorder of arteries and arterioles, unspecified: Secondary | ICD-10-CM | POA: Insufficient documentation

## 2023-10-28 DIAGNOSIS — M25551 Pain in right hip: Secondary | ICD-10-CM | POA: Diagnosis not present

## 2023-10-28 DIAGNOSIS — I739 Peripheral vascular disease, unspecified: Secondary | ICD-10-CM | POA: Diagnosis not present

## 2023-10-28 DIAGNOSIS — Z95828 Presence of other vascular implants and grafts: Secondary | ICD-10-CM | POA: Diagnosis not present

## 2023-10-28 LAB — VAS US ABI WITH/WO TBI
Left ABI: 1.09
Right ABI: 1.07

## 2023-10-28 NOTE — Progress Notes (Signed)
Office Note     CC:  follow up Requesting Provider:  Mercie Eon, MD  HPI: Susan Holmes is a 67 y.o. ([birth year]) female who presents for routine follow up of PAD. She has history of left EIA stenting in September of 2020 followed by left femoral to above knee popliteal artery bypass with vein by Dr. Darrick Penna in October 2020.   She was last seen in December of 2023. At that time all of her non invasive studies showed patency of her LLE. She was having some right lower extremity hip and left thigh pain but no clear claudication or rest pain. She was advised that likely her symptoms were musculoskeletal in nature or more neuropathic and Ortho referral was made.   Today she reports she has continued right hip pain. She was seen by [facility] in December of 2023 after our referral and was diagnosed with trochanteric bursitis. She was given a steroid injection and told to follow up. She explains that the injection helped for a little bit but then it wore off. She says she just hasn't followed up. She otherwise denies any pain in her legs on ambulation or rest. No tissue loss. She has not been walking as much due to her hip pain but she says she tries to walk as much as she can. She does go to Pain management.   She is medically managed on Aspirin and statin She takes CCB and ARB for hypertension She is a diabetic She is current smoker, 1/2 ppd  Past Medical History:  Diagnosis Date   Anxiety    Arthritis    Callus of foot 09/19/2018   Chest pain 09/26/2018   Claudication (HCC) 09/14/2019   Congenital blindness    Left  eye   CONGENITAL BLINDNESS, LEFT EYE 06/12/2007   Qualifier: Diagnosis of   By: Drinkard MSN, FNP-C, Sue         Coronary artery disease    per cardiac cath 2015-- mild disease involving LAD and branches ,  LCFx and branches, and mild disease pRCA   Cough 07/08/2016   Depression    Diabetes mellitus, type II (HCC)    followed by pcp   Glaucoma    Glaucoma, both eyes     Hepatitis B infection pt unsure   resolved   History of kidney stones    right stone   History of transient ischemic attack (TIA) 2017   per pt no residuals   History of vitamin D deficiency 09/06/2017   HTN (hypertension)    followed by pcp   Hyperlipidemia    Hyperparathyroidism (HCC) 03/08/2013   Appears to be diagnosed around 02/2013:  PTH 109.9  Vit d 25-OH 02/2013: 19  Dexa 02/2013: Patient's diagnostic category is NORMAL by WHO Criteria. FRACTURE RISK: NOT INCREASED FRAX: Not calculated due to a T score at or above -1.0.    Serum calcium initially 11.8  Ionized calcium elevated 1.45        Liver hemangioma 04/04/2014   PAD (peripheral artery disease) (HCC)    Paronychia of great toe, left 06/21/2019   Poor dental hygiene    Stroke (HCC)    Tobacco abuse    .5 PPD smoker   Uncontrolled type II diabetes mellitus 06/12/2007   Qualifier: Diagnosis of  By: Drinkard MSN, FNP-C, Fannie Knee      Past Surgical History:  Procedure Laterality Date   ABDOMINAL AORTOGRAM W/LOWER EXTREMITY N/A 09/14/2019   Procedure: ABDOMINAL AORTOGRAM W/LOWER EXTREMITY;  Surgeon: Darrick Penna,  Janetta Hora, MD;  Location: [facility] INVASIVE CV LAB;  Service: Cardiovascular;  Laterality: N/A;   ABDOMINAL HYSTERECTOMY  1980s   unsure if ovaries were taken   BYPASS GRAFT Left 10/22/2019    Left femoral to above-knee popliteal bypass using reversed ipsilateral greater saphenous vein   colonoscopy     FEMORAL-POPLITEAL BYPASS GRAFT Left 10/22/2019   Procedure: LEFT FEMORAL-ABOVE KNEE POPLITEAL  BYPASS GRAFT with REVERSED GREATER SAPHENOUS VEIN;  Surgeon: Sherren Kerns, MD;  Location: [facility] OR;  Service: Vascular;  Laterality: Left;   GLAUCOMA SURGERY Right 2017   IR URETERAL STENT RIGHT NEW ACCESS W/O SEP NEPHROSTOMY CATH  10/03/2018   LEFT HEART CATHETERIZATION WITH CORONARY ANGIOGRAM N/A 10/09/2014   Procedure: LEFT HEART CATHETERIZATION WITH CORONARY ANGIOGRAM;  Surgeon: Lennette Bihari, MD;  [facility];  Service:  Cardiovascular;  Laterality: N/A;   NEPHROLITHOTOMY Right 10/03/2018   Procedure: RIGHT NEPHROLITHOTOMY PERCUTANEOUS FIRST STAGE;  Surgeon: Bjorn Pippin, MD;  Location: [facility] ORS;  Service: Urology;  Laterality: Right;   PERIPHERAL VASCULAR INTERVENTION Left 09/14/2019   Procedure: PERIPHERAL VASCULAR INTERVENTION;  Surgeon: Sherren Kerns, MD;  Location: [facility] INVASIVE CV LAB;  Service: Cardiovascular;  Laterality: Left;   left external iliac stent    TEE WITHOUT CARDIOVERSION N/A 08/17/2016   Procedure: TRANSESOPHAGEAL ECHOCARDIOGRAM (TEE);  Surgeon: Quintella Reichert, MD;  Location: [facility] ENDOSCOPY;  Service: Cardiovascular;  Laterality: N/A;    Social History   Socioeconomic History   Marital status: Divorced    Spouse name: Not on file   Number of children: 1   Years of education: 12   Highest education level: Not on file  Occupational History   Occupation: Disabled  Tobacco Use   Smoking status: Every Day    Current packs/day: 0.50    Average packs/day: 0.5 packs/day for 43.8 years (21.9 ttl pk-yrs)    Types: Cigarettes    Start date: 54   Smokeless tobacco: Current    Types: Chew   Tobacco comments:    6 cigs per day  Vaping Use   Vaping status: Never Used  Substance and Sexual Activity   Alcohol use: No    Alcohol/week: 0.0 standard drinks of alcohol   Drug use: No   Sexual activity: Not Currently    Birth control/protection: Surgical  Other Topics Concern   Not on file  Social History Narrative   Current Social History 08/18/2020        Patient lives alone in a ground floor apartment which is 1 story. There are 13 steps with handrail up to the entrance the patient uses.       Patient's method of transportation is [facility] transportation.      The highest level of education was 2 years college.      The patient currently disabled.      Identified important Relationships are "My daughter, brothers, grands and great-grands."       Pets : chihuahua Catering manager)       Interests  / Fun: "I don't"       Current Stressors: "Nothing"       Religious / Personal Beliefs: '[employer]"       Other: "I am a very loving person"      L. Ducatte, BSN, RN-BC        Social Determinants of Health   Financial Resource Strain: Low Risk  (11/10/2022)   Overall Financial Resource Strain (CARDIA)    Difficulty of Paying Living Expenses:  Not hard at all  Food Insecurity: No Food Insecurity (11/10/2022)   Hunger Vital Sign    Worried About Running Out of Food in the Last Year: Never true    Ran Out of Food in the Last Year: Never true  Transportation Needs: No Transportation Needs (11/10/2022)   PRAPARE - Administrator, Civil Service (Medical): No    Lack of Transportation (Non-Medical): No  Physical Activity: Inactive (11/10/2022)   Exercise Vital Sign    Days of Exercise per Week: 0 days    Minutes of Exercise per Session: 0 min  Stress: No Stress Concern Present (11/10/2022)   [employer] of Occupational Health - Occupational Stress Questionnaire    Feeling of Stress : Only a little  Social Connections: Moderately Integrated (11/10/2022)   Social Connection and Isolation Panel [NHANES]    Frequency of Communication with Friends and Family: More than three times a week    Frequency of Social Gatherings with Friends and Family: Once a week    Attends Religious Services: More than 4 times per year    Active Member of [employer] or Organizations: No    Attends Engineer, structural: More than 4 times per year    Marital Status: Divorced  Catering manager Violence: Not At Risk (11/10/2022)   Humiliation, Afraid, Rape, and Kick questionnaire    Fear of Current or Ex-Partner: No    Emotionally Abused: No    Physically Abused: No    Sexually Abused: No   Family History  Problem Relation Age of Onset   Heart disease Mother 58   Hypertension Mother    CVA Mother    Heart disease Father 70   CVA Sister    Heart disease Sister    Aneurysm Sister     CVA Brother    Aneurysm Brother    CVA Maternal Grandmother    CVA Maternal Grandfather    Hypertension Brother    Colon cancer Neg Hx    Esophageal cancer Neg Hx    Stomach cancer Neg Hx     Current Outpatient Medications  Medication Sig Dispense Refill   amLODipine (NORVASC) 10 MG tablet Take 1 tablet (10 mg total) by mouth daily. 90 tablet 3   aspirin EC 81 MG tablet Take 1 tablet (81 mg total) by mouth daily. 90 tablet 1   baclofen (LIORESAL) 10 MG tablet Take 10 mg by mouth 3 (three) times daily as needed.     Blood Glucose Monitoring Suppl (ONETOUCH VERIO) w/Device KIT Use as directed 1 kit 0   buprenorphine (BUTRANS) 10 MCG/HR PTWK 1 patch once a week.     busPIRone (BUSPAR) 10 MG tablet Take 1 tablet (10 mg total) by mouth daily. buspirone 10 mg tablet  TAKE 1 TABLET BY MOUTH THREE TIMES DAILY 90 tablet 0   celecoxib (CELEBREX) 50 MG capsule Take 50 mg by mouth 2 (two) times daily.     diclofenac Sodium (VOLTAREN) 1 % GEL Apply 4 g topically 3 (three) times daily as needed (pain). 150 g 2   Dulaglutide (TRULICITY) 3 MG/0.5ML SOPN Inject 3 mg as directed once a week. 2 mL 5   glucose blood (ONETOUCH VERIO) test strip Use as instructed 100 each 12   hydrOXYzine (ATARAX) 25 MG tablet Take 25 mg by mouth 3 (three) times daily as needed.     insulin glargine (LANTUS SOLOSTAR) 100 UNIT/ML Solostar Pen Inject 44 Units into the skin at bedtime. 15 mL 3  insulin lispro (HUMALOG KWIKPEN) 100 UNIT/ML KwikPen Inject 5 Units into the skin 2 (two) times daily with a meal. 15 mL 1   Insulin Pen Needle 32G X 4 MM MISC Use to inject insulin 4 times a day 360 each 3   irbesartan (AVAPRO) 300 MG tablet Take 1 tablet (300 mg total) by mouth daily. 90 tablet 1   lidocaine (LIDODERM) 5 % 1 Patch(s) Topical     LINZESS 290 MCG CAPS capsule Take 290 mcg by mouth daily as needed.     metFORMIN (GLUCOPHAGE XR) 500 MG 24 hr tablet Take 1 tablet (500 mg total) by mouth 2 (two) times daily with a meal. 180  tablet 3   mirtazapine (REMERON) 45 MG tablet Take 1 tablet (45 mg total) by mouth at bedtime. 30 tablet 1   NARCAN 4 MG/0.1ML LIQD nasal spray kit Place 1 spray into the nose once.     OLANZapine (ZYPREXA) 5 MG tablet Take 1 tablet (5 mg total) by mouth at bedtime.     OneTouch Delica Lancets 33G MISC Use with one touch Delica lancing device and Accu chek Guideme meter and Accu Chek guide strips to check your blood sugar up to 3 times a day 100 each 11   oxyCODONE (ROXICODONE) 15 MG immediate release tablet Take 15 mg by mouth every other day as needed for pain.      pantoprazole (PROTONIX) 40 MG tablet Take 40 mg by mouth daily.     prednisoLONE acetate (PRED FORTE) 1 % ophthalmic suspension Apply to eye.     ramelteon (ROZEREM) 8 MG tablet Take 8 mg by mouth at bedtime.     rosuvastatin (CRESTOR) 20 MG tablet TAKE 1 TABLET(20 MG) BY MOUTH AT BEDTIME 90 tablet 2   traZODone (DESYREL) 100 MG tablet Take 300 mg by mouth at bedtime.     venlafaxine XR (EFFEXOR-XR) 75 MG 24 hr capsule Take 75 mg by mouth every morning.     No current facility-administered medications for this visit.    Allergies  Allergen Reactions   Ace Inhibitors Cough   Lipitor [Atorvastatin] Other (See Comments)    MYALGIA    Chantix [Varenicline] Nausea And Vomiting     REVIEW OF SYSTEMS:  [X]  denotes positive finding, [ ]  denotes negative finding Cardiac  Comments:  Chest pain or chest pressure:    Shortness of breath upon exertion:    Short of breath when lying flat:    Irregular heart rhythm:        Vascular    Pain in calf, thigh, or hip brought on by ambulation:    Pain in feet at night that wakes you up from your sleep:     Blood clot in your veins:    Leg swelling:         Pulmonary    Oxygen at home:    Productive cough:     Wheezing:         Neurologic    Sudden weakness in arms or legs:     Sudden numbness in arms or legs:     Sudden onset of difficulty speaking or slurred speech:     Temporary loss of vision in one eye:     Problems with dizziness:         Gastrointestinal    Blood in stool:     Vomited blood:         Genitourinary    Burning when urinating:  Blood in urine:        Psychiatric    Major depression:         Hematologic    Bleeding problems:    Problems with blood clotting too easily:        Skin    Rashes or ulcers:        Constitutional    Fever or chills:      PHYSICAL EXAMINATION:  Vitals:   10/28/23 1021 10/28/23 1023  BP: 132/85 132/86  Pulse: 82   Resp: 16   Temp: 98 F (36.7 C)   TempSrc: Temporal   SpO2: 95%   Weight: 167 lb 6.4 oz (75.9 kg)   Height: 5\' 7"  (1.702 m)     General:  WDWN in NAD; vital signs documented above Gait: Normal HENT: WNL, normocephalic Pulmonary: normal non-labored breathing without wheezing Cardiac: regular HR Abdomen: soft, NT, no masses Vascular Exam/Pulses: 2+ femoral pulses, 2+ DP pulses bilaterally, feet warm and well perfused Extremities: without ischemic changes, without Gangrene , without cellulitis; without open wounds;  Musculoskeletal: no muscle wasting or atrophy  Neurologic: A&O X 3 Psychiatric:  The pt has Normal affect.   Non-Invasive Vascular Imaging:   +-------+-----------+-----------+------------+------------+  ABI/TBIToday's ABIToday's TBIPrevious ABIPrevious TBI  +-------+-----------+-----------+------------+------------+  Right 1.07       0.72       1.12        0.96          +-------+-----------+-----------+------------+------------+  Left  1.09       0.67       1.15        0.84          +-------+-----------+-----------+------------+------------+    Left Graft #1: CFA to AK pop  +--------------------+--------+--------+---------+--------+                     PSV cm/sStenosisWaveform Comments  +--------------------+--------+--------+---------+--------+  Inflow             87              triphasic           +--------------------+--------+--------+---------+--------+  Proximal Anastomosis114             triphasic          +--------------------+--------+--------+---------+--------+  Proximal Graft      52              triphasic          +--------------------+--------+--------+---------+--------+  Mid Graft           89              triphasic          +--------------------+--------+--------+---------+--------+  Distal Graft        63              triphasic          +--------------------+--------+--------+---------+--------+  Distal Anastomosis  66              triphasic          +--------------------+--------+--------+---------+--------+  Outflow            67              triphasic          +--------------------+--------+--------+---------+--------+     Summary:  Left: Patent graft with no stenosis.    ASSESSMENT/PLAN:: 67 y.o. female here for follow up for PAD. She has history of left EIA stenting in September of 2020  followed by left femoral to above knee popliteal artery bypass with vein by Dr. Darrick Penna in October 2020. She does not have any claudication, rest pain or tissue loss. She continues to have right hip pain. She has not followed up with Dr. August Saucer regarding this. Will try to get her re referred back there for further management.  - ABIs overall stable. Slight decrease in TBIs bilaterally. Triphasic flow - Duplex shows patent left lower extremity fem to above knee popliteal artery bypass - Continue Aspirin and statin - Encourage her to walk as much as possible - Encourage smoking cessation - She will follow up in 1 year with aorto/iliac duplex and left lower extremity bypass graft duplex with ABI  Graceann Congress, PA-C [facility] [phone]  Clinic MD: Hetty Blend

## 2023-10-31 DIAGNOSIS — Z79899 Other long term (current) drug therapy: Secondary | ICD-10-CM | POA: Diagnosis not present

## 2023-11-09 ENCOUNTER — Encounter: Payer: Self-pay | Admitting: Internal Medicine

## 2023-11-09 ENCOUNTER — Ambulatory Visit: Payer: 59 | Admitting: Internal Medicine

## 2023-11-09 VITALS — BP 139/76 | HR 94 | Temp 98.6°F | Ht 67.0 in | Wt 168.5 lb

## 2023-11-09 DIAGNOSIS — M7541 Impingement syndrome of right shoulder: Secondary | ICD-10-CM

## 2023-11-09 DIAGNOSIS — Z7985 Long-term (current) use of injectable non-insulin antidiabetic drugs: Secondary | ICD-10-CM | POA: Diagnosis not present

## 2023-11-09 DIAGNOSIS — M7061 Trochanteric bursitis, right hip: Secondary | ICD-10-CM

## 2023-11-09 DIAGNOSIS — E1165 Type 2 diabetes mellitus with hyperglycemia: Secondary | ICD-10-CM | POA: Diagnosis not present

## 2023-11-09 DIAGNOSIS — Z794 Long term (current) use of insulin: Secondary | ICD-10-CM

## 2023-11-09 DIAGNOSIS — M7540 Impingement syndrome of unspecified shoulder: Secondary | ICD-10-CM | POA: Insufficient documentation

## 2023-11-09 DIAGNOSIS — K3189 Other diseases of stomach and duodenum: Secondary | ICD-10-CM

## 2023-11-09 DIAGNOSIS — M7542 Impingement syndrome of left shoulder: Secondary | ICD-10-CM

## 2023-11-09 DIAGNOSIS — Z7984 Long term (current) use of oral hypoglycemic drugs: Secondary | ICD-10-CM | POA: Diagnosis not present

## 2023-11-09 MED ORDER — INSULIN LISPRO (1 UNIT DIAL) 100 UNIT/ML (KWIKPEN): 6.0000 [IU] | PEN_INJECTOR | Freq: Two times a day (BID) | SUBCUTANEOUS | 1 refills | Status: DC

## 2023-11-09 MED ORDER — TRULICITY 4.5 MG/0.5ML ~~LOC~~ SOAJ: 4.5000 mg | SUBCUTANEOUS | 2 refills | Status: AC

## 2023-11-09 NOTE — Assessment & Plan Note (Signed)
-   Patient still has not called Dr Diamantina [place] office to schedule, so I again provided his number to her - The pain is worsening and there was concern for possible AVN of the right hip previously, so I think it is very important for her to see him

## 2023-11-09 NOTE — Assessment & Plan Note (Signed)
-   A1C was up from 5.5 to 10.9 last month.  - I had added Humalog 5mg  BID with meals/snacks.  - I reviewed her glucometer from 10/15-11/13.  - She wakes at 9am, eats first at 3pm (largest meal), then has a snack around 9pm. She is checking fasting sugars intermittently and is regularly checking post-prandial sugars around 5pm. Fasting sugars have been 122-223. No lows. Post-prandial sugars have sometimes been 130s-170s and also been up to the 300s-400s.  - She is trying hard to take her Humalog, thinks she takes it 4-5 times weekly before her big meal at 3pm and 3-4 times weekly before her nighttime snack.   Plan: - Continue Lantus 44 units daily - Increase Humalog to 6 units BID. I strongly encouraged better adherence with this - Continue metformin ER 500mg  BID - Increase Trulicity to 4.5mg  weekly - Could consider SGLT2 in the future - A1C at next visit  - Counseled on healthy diet, including benefit of 3 meals daily

## 2023-11-09 NOTE — Patient Instructions (Signed)
Thank you, Ms.Lindalou L Gunnoe for allowing [place] to provide your care today. Today we discussed your diabetes and your shoulder pain.    I have ordered the following labs for you:  Lab Orders  No laboratory test(s) ordered today     Tests ordered today:  None  Referrals ordered today:   Referral Orders  No referral(s) requested today     I have ordered the following medication/changed the following medications:   Stop the following medications: Medications Discontinued During This Encounter  Medication Reason   Dulaglutide (TRULICITY) 3 MG/0.5ML SOPN    insulin lispro (HUMALOG KWIKPEN) 100 UNIT/ML KwikPen Reorder     Start the following medications: Meds ordered this encounter  Medications   Dulaglutide (TRULICITY) 4.5 MG/0.5ML SOAJ    Sig: Inject 4.5 mg as directed once a week.    Dispense:  2 mL    Refill:  2   insulin lispro (HUMALOG KWIKPEN) 100 UNIT/ML KwikPen    Sig: Inject 6 Units into the skin 2 (two) times daily with a meal.    Dispense:  15 mL    Refill:  1     Return in about 2 months (around 01/09/2024) for Diabetes.    Remember:  - For your shoulder and hip, please call the Orthopedic Office (Dr. August Saucer) to schedule an appointment. Here is their information Bonnita Nasuti Referral Coordinator Cyndia Skeeters  [phone]  - I gave you some physical therapy exercises for your shoulder pain. You can use Voltaren gel up to 4 times daily on your shoulders  - For your diabetes, we are increasing your Trulicity to 4.5mg  weekly and increasing your Humalog to 6 units twice daily. It is very important to remember to take these    Should you have any questions or concerns please call the internal medicine clinic at [phone].     Mercie Eon, MD Faculty, Internal Medicine Teaching [facility]

## 2023-11-09 NOTE — Progress Notes (Signed)
[facility]: Clinic Note  Subjective:  History of Present Illness: Susan Holmes is a 67 y.o. year old female who presents for 4-week follow up for T2DM.  We discussed her diabetes at length today.  Her other main concern is bilateral shoulder pain for the past 3 weeks.   Please refer to Assessment and Plan below for full details in Problem-Based Charting.   Past Medical History:  Patient Active Problem List   Diagnosis Date Noted   Rotator cuff impingement syndrome 11/09/2023   Coronary artery disease 05/03/2023   Delayed gastric emptying 12/31/2020   Sensorineural hearing loss (SNHL), bilateral 10/23/2020   Polypharmacy 09/09/2020   Peripheral arterial disease (HCC) 10/22/2019   Type 2 diabetes mellitus (HCC) 10/04/2019   Reactive airway disease 06/20/2018   History of CVA (cerebrovascular accident) 07/23/2016   Trochanteric bursitis of right hip 07/04/2015   Calcific tendinitis of right shoulder 02/26/2012   TOBACCO ABUSE 06/12/2007   Depression 06/12/2007   Essential hypertension 06/12/2007      Medications:  Current Outpatient Medications:    Dulaglutide (TRULICITY) 4.5 MG/0.5ML SOAJ, Inject 4.5 mg as directed once a week., Disp: 2 mL, Rfl: 2   amLODipine (NORVASC) 10 MG tablet, Take 1 tablet (10 mg total) by mouth daily., Disp: 90 tablet, Rfl: 3   aspirin EC 81 MG tablet, Take 1 tablet (81 mg total) by mouth daily., Disp: 90 tablet, Rfl: 1   baclofen (LIORESAL) 10 MG tablet, Take 10 mg by mouth 3 (three) times daily as needed., Disp: , Rfl:    Blood Glucose Monitoring Suppl (ONETOUCH VERIO) w/Device KIT, Use as directed, Disp: 1 kit, Rfl: 0   buprenorphine (BUTRANS) 10 MCG/HR PTWK, 1 patch once a week., Disp: , Rfl:    busPIRone (BUSPAR) 10 MG tablet, Take 1 tablet (10 mg total) by mouth daily. buspirone 10 mg tablet  TAKE 1 TABLET BY MOUTH THREE TIMES DAILY, Disp: 90 tablet, Rfl: 0   celecoxib (CELEBREX) 50 MG capsule, Take 50 mg by mouth 2  (two) times daily., Disp: , Rfl:    diclofenac Sodium (VOLTAREN) 1 % GEL, Apply 4 g topically 3 (three) times daily as needed (pain)., Disp: 150 g, Rfl: 2   glucose blood (ONETOUCH VERIO) test strip, Use as instructed, Disp: 100 each, Rfl: 12   hydrOXYzine (ATARAX) 25 MG tablet, Take 25 mg by mouth 3 (three) times daily as needed., Disp: , Rfl:    insulin glargine (LANTUS SOLOSTAR) 100 UNIT/ML Solostar Pen, Inject 44 Units into the skin at bedtime., Disp: 15 mL, Rfl: 3   insulin lispro (HUMALOG KWIKPEN) 100 UNIT/ML KwikPen, Inject 6 Units into the skin 2 (two) times daily with a meal., Disp: 15 mL, Rfl: 1   Insulin Pen Needle 32G X 4 MM MISC, Use to inject insulin 4 times a day, Disp: 360 each, Rfl: 3   irbesartan (AVAPRO) 300 MG tablet, Take 1 tablet (300 mg total) by mouth daily., Disp: 90 tablet, Rfl: 1   lidocaine (LIDODERM) 5 %, 1 Patch(s) Topical, Disp: , Rfl:    LINZESS 290 MCG CAPS capsule, Take 290 mcg by mouth daily as needed., Disp: , Rfl:    metFORMIN (GLUCOPHAGE XR) 500 MG 24 hr tablet, Take 1 tablet (500 mg total) by mouth 2 (two) times daily with a meal., Disp: 180 tablet, Rfl: 3   mirtazapine (REMERON) 45 MG tablet, Take 1 tablet (45 mg total) by mouth at bedtime., Disp: 30 tablet, Rfl: 1  NARCAN 4 MG/0.1ML LIQD nasal spray kit, Place 1 spray into the nose once., Disp: , Rfl:    OLANZapine (ZYPREXA) 5 MG tablet, Take 1 tablet (5 mg total) by mouth at bedtime., Disp: , Rfl:    OneTouch Delica Lancets 33G MISC, Use with one touch Delica lancing device and Accu chek Guideme meter and Accu Chek guide strips to check your blood sugar up to 3 times a day, Disp: 100 each, Rfl: 11   oxyCODONE (ROXICODONE) 15 MG immediate release tablet, Take 15 mg by mouth every other day as needed for pain. , Disp: , Rfl:    pantoprazole (PROTONIX) 40 MG tablet, Take 40 mg by mouth daily., Disp: , Rfl:    prednisoLONE acetate (PRED FORTE) 1 % ophthalmic suspension, Apply to eye., Disp: , Rfl:    ramelteon  (ROZEREM) 8 MG tablet, Take 8 mg by mouth at bedtime., Disp: , Rfl:    rosuvastatin (CRESTOR) 20 MG tablet, TAKE 1 TABLET(20 MG) BY MOUTH AT BEDTIME, Disp: 90 tablet, Rfl: 2   traZODone (DESYREL) 100 MG tablet, Take 300 mg by mouth at bedtime., Disp: , Rfl:    venlafaxine XR (EFFEXOR-XR) 75 MG 24 hr capsule, Take 75 mg by mouth every morning., Disp: , Rfl:    Allergies: Allergies  Allergen Reactions   Ace Inhibitors Cough   Lipitor [Atorvastatin] Other (See Comments)    MYALGIA    Chantix [Varenicline] Nausea And Vomiting       Objective:   Vitals: Vitals:   11/09/23 1009  BP: 139/76  Pulse: 94  Temp: 98.6 F (37 C)  SpO2: 100%     Physical Exam: Physical Exam Constitutional:      Appearance: Normal appearance.  Musculoskeletal:     Comments: Tenderness to palpation at bilateral AC joints. Limited range of active motion from 90 to 180 degrees on bilateral shoulder abduction, normal passive range of motion. +empty can test bilaterally, with L>R pain. 5/5 strength in b/l UE  Neurological:     Mental Status: She is alert.      Data: Labs, imaging, and micro were reviewed in Epic. Refer to Assessment and Plan below for full details in Problem-Based Charting.  Assessment & Plan:  Type 2 diabetes mellitus (HCC) - A1C was up from 5.5 to 10.9 last month.  - I had added Humalog 5mg  BID with meals/snacks.  - I reviewed her glucometer from 10/15-11/13.  - She wakes at 9am, eats first at 3pm (largest meal), then has a snack around 9pm. She is checking fasting sugars intermittently and is regularly checking post-prandial sugars around 5pm. Fasting sugars have been 122-223. No lows. Post-prandial sugars have sometimes been 130s-170s and also been up to the 300s-400s.  - She is trying hard to take her Humalog, thinks she takes it 4-5 times weekly before her big meal at 3pm and 3-4 times weekly before her nighttime snack.   Plan: - Continue Lantus 44 units daily - Increase  Humalog to 6 units BID. I strongly encouraged better adherence with this - Continue metformin ER 500mg  BID - Increase Trulicity to 4.5mg  weekly - Could consider SGLT2 in the future - A1C at next visit  - Counseled on healthy diet, including benefit of 3 meals daily  Trochanteric bursitis of right hip - Patient still has not called Dr Diamantina [place] office to schedule, so I again provided his number to her - The pain is worsening and there was concern for possible AVN of the right hip previously,  so I think it is very important for her to see him  Rotator cuff impingement syndrome - She has had 3 weeks of bilateral, L>R shoulder pain with no preceding trauma. Pain is worst in anterior shoulder. Worse with moving her arms, especially lifting. No associated numbness, tingling, or weakness.  - She has been using Voltaren gel every other day with some relief and takes chronic Celebrex, Suboxone, and Oxycodone prescribed by a pain mgmt doc at [facility] - On exam, she has ttp over [facility] joint bilaterally, no effusions, and pain with abduction >90 degrees bilaterally, +empty can test - I think her presentation is consistent with impingement syndrome. Continue PO NSAID. Increase topical Voltaren to 4x daily. I gave her PT exercises to try. She will also follow up with Ortho, as steroid injections were helpful in 2021.      Patient will follow up in 2 months for A1C check  Mercie Eon, MD

## 2023-11-09 NOTE — Assessment & Plan Note (Signed)
-   She has had 3 weeks of bilateral, L>R shoulder pain with no preceding trauma. Pain is worst in anterior shoulder. Worse with moving her arms, especially lifting. No associated numbness, tingling, or weakness.  - She has been using Voltaren gel every other day with some relief and takes chronic Celebrex, Suboxone, and Oxycodone prescribed by a pain mgmt doc at [facility] - On exam, she has ttp over [facility] joint bilaterally, no effusions, and pain with abduction >90 degrees bilaterally, +empty can test - I think her presentation is consistent with impingement syndrome. Continue PO NSAID. Increase topical Voltaren to 4x daily. I gave her PT exercises to try. She will also follow up with Ortho, as steroid injections were helpful in 2021.

## 2023-11-11 ENCOUNTER — Other Ambulatory Visit: Payer: Self-pay

## 2023-11-11 MED ORDER — ONETOUCH VERIO VI STRP: ORAL_STRIP | 12 refills | Status: AC

## 2023-11-11 NOTE — Telephone Encounter (Signed)
Medication sent to pharmacy  

## 2023-11-17 ENCOUNTER — Other Ambulatory Visit: Payer: Self-pay

## 2023-11-17 DIAGNOSIS — I739 Peripheral vascular disease, unspecified: Secondary | ICD-10-CM

## 2023-11-28 ENCOUNTER — Telehealth: Payer: Self-pay

## 2023-11-28 MED ORDER — ONETOUCH VERIO W/DEVICE KIT: PACK | 0 refills | Status: DC

## 2023-11-28 NOTE — Telephone Encounter (Signed)
New meter sent to pharmacy. 

## 2023-11-30 DIAGNOSIS — Z79899 Other long term (current) drug therapy: Secondary | ICD-10-CM | POA: Diagnosis not present

## 2023-11-30 DIAGNOSIS — Z86718 Personal history of other venous thrombosis and embolism: Secondary | ICD-10-CM | POA: Diagnosis not present

## 2023-11-30 DIAGNOSIS — M11262 Other chondrocalcinosis, left knee: Secondary | ICD-10-CM | POA: Diagnosis not present

## 2023-11-30 DIAGNOSIS — M542 Cervicalgia: Secondary | ICD-10-CM | POA: Diagnosis not present

## 2023-11-30 DIAGNOSIS — M546 Pain in thoracic spine: Secondary | ICD-10-CM | POA: Diagnosis not present

## 2023-11-30 DIAGNOSIS — M62830 Muscle spasm of back: Secondary | ICD-10-CM | POA: Diagnosis not present

## 2023-11-30 DIAGNOSIS — M25562 Pain in left knee: Secondary | ICD-10-CM | POA: Diagnosis not present

## 2023-11-30 DIAGNOSIS — G8929 Other chronic pain: Secondary | ICD-10-CM | POA: Diagnosis not present

## 2023-11-30 DIAGNOSIS — M25511 Pain in right shoulder: Secondary | ICD-10-CM | POA: Diagnosis not present

## 2023-11-30 DIAGNOSIS — M545 Low back pain, unspecified: Secondary | ICD-10-CM | POA: Diagnosis not present

## 2023-12-02 DIAGNOSIS — Z79899 Other long term (current) drug therapy: Secondary | ICD-10-CM | POA: Diagnosis not present

## 2023-12-05 ENCOUNTER — Other Ambulatory Visit: Payer: Self-pay | Admitting: Internal Medicine

## 2023-12-05 DIAGNOSIS — Z794 Long term (current) use of insulin: Secondary | ICD-10-CM

## 2023-12-08 DIAGNOSIS — H30041 Focal chorioretinal inflammation, macular or paramacular, right eye: Secondary | ICD-10-CM | POA: Diagnosis not present

## 2023-12-08 DIAGNOSIS — H33191 Other retinoschisis and retinal cysts, right eye: Secondary | ICD-10-CM | POA: Diagnosis not present

## 2023-12-08 DIAGNOSIS — H401113 Primary open-angle glaucoma, right eye, severe stage: Secondary | ICD-10-CM | POA: Diagnosis not present

## 2023-12-08 DIAGNOSIS — H401122 Primary open-angle glaucoma, left eye, moderate stage: Secondary | ICD-10-CM | POA: Diagnosis not present

## 2023-12-08 DIAGNOSIS — H35351 Cystoid macular degeneration, right eye: Secondary | ICD-10-CM | POA: Diagnosis not present

## 2023-12-16 ENCOUNTER — Other Ambulatory Visit: Payer: Self-pay

## 2023-12-16 MED ORDER — METFORMIN HCL ER 500 MG PO TB24: 500.0000 mg | ORAL_TABLET | Freq: Two times a day (BID) | ORAL | 3 refills | Status: DC

## 2023-12-19 NOTE — Telephone Encounter (Signed)
error 

## 2023-12-23 ENCOUNTER — Ambulatory Visit (INDEPENDENT_AMBULATORY_CARE_PROVIDER_SITE_OTHER): Payer: 59 | Admitting: Podiatry

## 2023-12-23 ENCOUNTER — Other Ambulatory Visit: Payer: Self-pay

## 2023-12-23 ENCOUNTER — Encounter: Payer: Self-pay | Admitting: Podiatry

## 2023-12-23 DIAGNOSIS — E1151 Type 2 diabetes mellitus with diabetic peripheral angiopathy without gangrene: Secondary | ICD-10-CM

## 2023-12-23 DIAGNOSIS — I10 Essential (primary) hypertension: Secondary | ICD-10-CM

## 2023-12-23 DIAGNOSIS — M79675 Pain in left toe(s): Secondary | ICD-10-CM

## 2023-12-23 DIAGNOSIS — M79674 Pain in right toe(s): Secondary | ICD-10-CM

## 2023-12-23 DIAGNOSIS — L84 Corns and callosities: Secondary | ICD-10-CM

## 2023-12-23 DIAGNOSIS — B351 Tinea unguium: Secondary | ICD-10-CM | POA: Diagnosis not present

## 2023-12-23 MED ORDER — IRBESARTAN 300 MG PO TABS: 300.0000 mg | ORAL_TABLET | Freq: Every day | ORAL | 1 refills | Status: DC

## 2023-12-23 NOTE — Telephone Encounter (Signed)
Medication sent to pharmacy  

## 2023-12-23 NOTE — Progress Notes (Signed)
This patient returns to my office for at risk foot care.  This patient requires this care by a professional since this patient will be at risk due to having diabetes.  This patient is unable to cut nails himself since the patient cannot reach his nails.These nails are painful walking and wearing shoes.  This patient presents for at risk foot care today.  General Appearance  Alert, conversant and in no acute stress.  Vascular  Dorsalis pedis and posterior tibial  pulses are palpable  bilaterally.  Capillary return is within normal limits  bilaterally. Temperature is within normal limits  bilaterally.  Neurologic  Senn-Weinstein monofilament wire test within normal limits  bilaterally. Muscle power within normal limits bilaterally.  Nails Thick disfigured discolored nails with subungual debris  from hallux to fifth toes bilaterally. No evidence of bacterial infection or drainage bilaterally.  Orthopedic  No limitations of motion  feet .  No crepitus or effusions noted.  No bony pathology or digital deformities noted.  Skin  normotropic skin noted bilaterally.  No signs of infections or ulcers noted.   Callus sub 2 right foot.  Onychomycosis  Pain in right toes  Pain in left toes   Porokeratosis right forefoot.  Consent was obtained for treatment procedures.   Mechanical debridement of nails 1-5  bilaterally performed with a nail nipper.  Filed with dremel without incident. Debride porokeratosis with # 15 blade and dremel tool.   Return office visit    3 months                  Told patient to return for periodic foot care and evaluation due to potential at risk complications.   Gardiner Barefoot DPM

## 2023-12-26 DIAGNOSIS — E1140 Type 2 diabetes mellitus with diabetic neuropathy, unspecified: Secondary | ICD-10-CM | POA: Diagnosis not present

## 2023-12-26 DIAGNOSIS — Z79899 Other long term (current) drug therapy: Secondary | ICD-10-CM | POA: Diagnosis not present

## 2023-12-26 DIAGNOSIS — Z794 Long term (current) use of insulin: Secondary | ICD-10-CM | POA: Diagnosis not present

## 2023-12-26 DIAGNOSIS — F17210 Nicotine dependence, cigarettes, uncomplicated: Secondary | ICD-10-CM | POA: Diagnosis not present

## 2023-12-26 DIAGNOSIS — M5416 Radiculopathy, lumbar region: Secondary | ICD-10-CM | POA: Diagnosis not present

## 2023-12-26 DIAGNOSIS — Z9181 History of falling: Secondary | ICD-10-CM | POA: Diagnosis not present

## 2023-12-26 DIAGNOSIS — K5909 Other constipation: Secondary | ICD-10-CM | POA: Diagnosis not present

## 2023-12-26 DIAGNOSIS — E559 Vitamin D deficiency, unspecified: Secondary | ICD-10-CM | POA: Diagnosis not present

## 2023-12-26 DIAGNOSIS — I10 Essential (primary) hypertension: Secondary | ICD-10-CM | POA: Diagnosis not present

## 2023-12-27 DIAGNOSIS — Z79899 Other long term (current) drug therapy: Secondary | ICD-10-CM | POA: Diagnosis not present

## 2024-01-11 ENCOUNTER — Encounter: Payer: 59 | Admitting: Internal Medicine

## 2024-01-18 ENCOUNTER — Encounter: Payer: Self-pay | Admitting: *Deleted

## 2024-01-23 ENCOUNTER — Other Ambulatory Visit: Payer: Self-pay | Admitting: Physician Assistant

## 2024-01-23 ENCOUNTER — Other Ambulatory Visit: Payer: Self-pay

## 2024-01-23 DIAGNOSIS — I10 Essential (primary) hypertension: Secondary | ICD-10-CM

## 2024-01-23 DIAGNOSIS — Z9181 History of falling: Secondary | ICD-10-CM | POA: Diagnosis not present

## 2024-01-23 DIAGNOSIS — K5909 Other constipation: Secondary | ICD-10-CM | POA: Diagnosis not present

## 2024-01-23 DIAGNOSIS — Z79899 Other long term (current) drug therapy: Secondary | ICD-10-CM | POA: Diagnosis not present

## 2024-01-23 DIAGNOSIS — Z0000 Encounter for general adult medical examination without abnormal findings: Secondary | ICD-10-CM | POA: Diagnosis not present

## 2024-01-23 DIAGNOSIS — F17210 Nicotine dependence, cigarettes, uncomplicated: Secondary | ICD-10-CM | POA: Diagnosis not present

## 2024-01-23 DIAGNOSIS — E559 Vitamin D deficiency, unspecified: Secondary | ICD-10-CM | POA: Diagnosis not present

## 2024-01-23 DIAGNOSIS — E1140 Type 2 diabetes mellitus with diabetic neuropathy, unspecified: Secondary | ICD-10-CM | POA: Diagnosis not present

## 2024-01-23 DIAGNOSIS — M5416 Radiculopathy, lumbar region: Secondary | ICD-10-CM | POA: Diagnosis not present

## 2024-01-23 DIAGNOSIS — Z794 Long term (current) use of insulin: Secondary | ICD-10-CM | POA: Diagnosis not present

## 2024-01-23 MED ORDER — ROSUVASTATIN CALCIUM 20 MG PO TABS: ORAL_TABLET | ORAL | 0 refills | Status: DC

## 2024-01-24 NOTE — Progress Notes (Deleted)
[facility]: Clinic Note  Subjective:  History of Present Illness: Susan Holmes is a 68 y.o. year old female who presents for diabetes follow up.     Type 2 diabetes mellitus (HCC) - A1C was up from 5.5 to 10.9 last month.  - I had added Humalog 5mg  BID with meals/snacks.  - I reviewed her glucometer from 10/15-11/13.  - She wakes at 9am, eats first at 3pm (largest meal), then has a snack around 9pm. She is checking fasting sugars intermittently and is regularly checking post-prandial sugars around 5pm. Fasting sugars have been 122-223. No lows. Post-prandial sugars have sometimes been 130s-170s and also been up to the 300s-400s.  - She is trying hard to take her Humalog, thinks she takes it 4-5 times weekly before her big meal at 3pm and 3-4 times weekly before her nighttime snack.    Plan: - Continue Lantus 44 units daily - Increase Humalog to 6 units BID. I strongly encouraged better adherence with this - Continue metformin ER 500mg  BID - Increase Trulicity to 4.5mg  weekly - Could consider SGLT2 in the future - A1C at next visit  - Counseled on healthy diet, including benefit of 3 meals daily   Trochanteric bursitis of right hip - Patient still has not called Dr Diamantina [place] office to schedule, so I again provided his number to her - The pain is worsening and there was concern for possible AVN of the right hip previously, so I think it is very important for her to see him   Rotator cuff impingement syndrome - She has had 3 weeks of bilateral, L>R shoulder pain with no preceding trauma. Pain is worst in anterior shoulder. Worse with moving her arms, especially lifting. No associated numbness, tingling, or weakness.  - She has been using Voltaren gel every other day with some relief and takes chronic Celebrex, Suboxone, and Oxycodone prescribed by a pain mgmt doc at [facility] - On exam, she has ttp over [facility] joint bilaterally, no effusions, and pain with abduction  >90 degrees bilaterally, +empty can test - I think her presentation is consistent with impingement syndrome. Continue PO NSAID. Increase topical Voltaren to 4x daily. I gave her PT exercises to try. She will also follow up with Ortho, as steroid injections were helpful in 2021.     Please refer to Assessment and Plan below for full details in Problem-Based Charting.   Past Medical History:  Patient Active Problem List   Diagnosis Date Noted   Rotator cuff impingement syndrome 11/09/2023   Coronary artery disease 05/03/2023   Delayed gastric emptying 12/31/2020   Sensorineural hearing loss (SNHL), bilateral 10/23/2020   Polypharmacy 09/09/2020   Peripheral arterial disease (HCC) 10/22/2019   Type 2 diabetes mellitus (HCC) 10/04/2019   Reactive airway disease 06/20/2018   History of CVA (cerebrovascular accident) 07/23/2016   Trochanteric bursitis of right hip 07/04/2015   Calcific tendinitis of right shoulder 02/26/2012   TOBACCO ABUSE 06/12/2007   Depression 06/12/2007   Essential hypertension 06/12/2007      Medications:  Current Outpatient Medications:    amLODipine (NORVASC) 10 MG tablet, Take 1 tablet (10 mg total) by mouth daily., Disp: 90 tablet, Rfl: 3   aspirin EC 81 MG tablet, Take 1 tablet (81 mg total) by mouth daily., Disp: 90 tablet, Rfl: 1   baclofen (LIORESAL) 10 MG tablet, Take 10 mg by mouth 3 (three) times daily as needed., Disp: , Rfl:    Blood Glucose Monitoring Suppl (  ONETOUCH VERIO) w/Device KIT, Use as directed, Disp: 1 kit, Rfl: 0   buprenorphine (BUTRANS) 10 MCG/HR PTWK, 1 patch once a week., Disp: , Rfl:    busPIRone (BUSPAR) 10 MG tablet, Take 1 tablet (10 mg total) by mouth daily. buspirone 10 mg tablet  TAKE 1 TABLET BY MOUTH THREE TIMES DAILY, Disp: 90 tablet, Rfl: 0   celecoxib (CELEBREX) 50 MG capsule, Take 50 mg by mouth 2 (two) times daily., Disp: , Rfl:    diclofenac Sodium (VOLTAREN) 1 % GEL, Apply 4 g topically 3 (three) times daily as needed  (pain)., Disp: 150 g, Rfl: 2   Dulaglutide (TRULICITY) 4.5 MG/0.5ML SOAJ, Inject 4.5 mg as directed once a week., Disp: 2 mL, Rfl: 2   glucose blood (ONETOUCH VERIO) test strip, Use as instructed, Disp: 100 each, Rfl: 12   hydrOXYzine (ATARAX) 25 MG tablet, Take 25 mg by mouth 3 (three) times daily as needed., Disp: , Rfl:    insulin glargine (LANTUS SOLOSTAR) 100 UNIT/ML Solostar Pen, ADMINISTER 40 UNITS UNDER THE SKIN AT BEDTIME, Disp: 15 mL, Rfl: 3   insulin lispro (HUMALOG KWIKPEN) 100 UNIT/ML KwikPen, Inject 6 Units into the skin 2 (two) times daily with a meal., Disp: 15 mL, Rfl: 1   Insulin Pen Needle 32G X 4 MM MISC, Use to inject insulin 4 times a day, Disp: 360 each, Rfl: 3   irbesartan (AVAPRO) 300 MG tablet, Take 1 tablet (300 mg total) by mouth daily., Disp: 90 tablet, Rfl: 1   lidocaine (LIDODERM) 5 %, 1 Patch(s) Topical, Disp: , Rfl:    LINZESS 290 MCG CAPS capsule, Take 290 mcg by mouth daily as needed., Disp: , Rfl:    metFORMIN (GLUCOPHAGE XR) 500 MG 24 hr tablet, Take 1 tablet (500 mg total) by mouth 2 (two) times daily with a meal., Disp: 180 tablet, Rfl: 3   mirtazapine (REMERON) 45 MG tablet, Take 1 tablet (45 mg total) by mouth at bedtime., Disp: 30 tablet, Rfl: 1   NARCAN 4 MG/0.1ML LIQD nasal spray kit, Place 1 spray into the nose once., Disp: , Rfl:    OLANZapine (ZYPREXA) 5 MG tablet, Take 1 tablet (5 mg total) by mouth at bedtime., Disp: , Rfl:    OneTouch Delica Lancets 33G MISC, Use with one touch Delica lancing device and Accu chek Guideme meter and Accu Chek guide strips to check your blood sugar up to 3 times a day, Disp: 100 each, Rfl: 11   oxyCODONE (ROXICODONE) 15 MG immediate release tablet, Take 15 mg by mouth every other day as needed for pain. , Disp: , Rfl:    pantoprazole (PROTONIX) 40 MG tablet, Take 40 mg by mouth daily., Disp: , Rfl:    prednisoLONE acetate (PRED FORTE) 1 % ophthalmic suspension, Apply to eye., Disp: , Rfl:    ramelteon (ROZEREM) 8 MG  tablet, Take 8 mg by mouth at bedtime., Disp: , Rfl:    rosuvastatin (CRESTOR) 20 MG tablet, TAKE 1 TABLET(20 MG) BY MOUTH AT BEDTIME, Disp: 30 tablet, Rfl: 0   traZODone (DESYREL) 100 MG tablet, Take 300 mg by mouth at bedtime., Disp: , Rfl:    venlafaxine XR (EFFEXOR-XR) 75 MG 24 hr capsule, Take 75 mg by mouth every morning., Disp: , Rfl:    Allergies: Allergies  Allergen Reactions   Ace Inhibitors Cough   Lipitor [Atorvastatin] Other (See Comments)    MYALGIA    Chantix [Varenicline] Nausea And Vomiting       Objective:  Vitals: There were no vitals filed for this visit.   Physical Exam: Physical Exam   Data: Labs, imaging, and micro were reviewed in Epic. Refer to Assessment and Plan below for full details in Problem-Based Charting.  Assessment & Plan:  No problem-specific Assessment & Plan notes found for this encounter.     Patient will follow up in ***  Mercie Eon, MD

## 2024-01-25 ENCOUNTER — Telehealth: Payer: Self-pay | Admitting: Dietician

## 2024-01-25 ENCOUNTER — Encounter: Payer: 59 | Admitting: Internal Medicine

## 2024-01-25 DIAGNOSIS — Z79899 Other long term (current) drug therapy: Secondary | ICD-10-CM | POA: Diagnosis not present

## 2024-01-25 NOTE — Telephone Encounter (Signed)
Sept 26, 2024 last saw [facility] for a limited exam, 08-10-2022 was her last full exam. Due for complete eye exam now. (She cancelled 2x) They are faxing the notes.

## 2024-01-30 ENCOUNTER — Encounter: Payer: Self-pay | Admitting: Dietician

## 2024-02-15 ENCOUNTER — Encounter: Payer: 59 | Admitting: Internal Medicine

## 2024-02-16 ENCOUNTER — Telehealth: Payer: Self-pay | Admitting: *Deleted

## 2024-02-16 NOTE — Telephone Encounter (Signed)
 Patient was identified as falling into the [place] Measure - Diabetes.   Patient was: Appointment scheduled with primary care provider in the next 30 days.

## 2024-02-20 ENCOUNTER — Telehealth: Payer: Self-pay

## 2024-02-20 NOTE — Telephone Encounter (Signed)
 Pharmacy is request  trulicity  for pt 4.5 mg but it is no longer on her med list  as active it states expired

## 2024-02-21 DIAGNOSIS — M5416 Radiculopathy, lumbar region: Secondary | ICD-10-CM | POA: Diagnosis not present

## 2024-02-21 DIAGNOSIS — Z9181 History of falling: Secondary | ICD-10-CM | POA: Diagnosis not present

## 2024-02-21 DIAGNOSIS — K5909 Other constipation: Secondary | ICD-10-CM | POA: Diagnosis not present

## 2024-02-21 DIAGNOSIS — F17210 Nicotine dependence, cigarettes, uncomplicated: Secondary | ICD-10-CM | POA: Diagnosis not present

## 2024-02-21 DIAGNOSIS — Z794 Long term (current) use of insulin: Secondary | ICD-10-CM | POA: Diagnosis not present

## 2024-02-21 DIAGNOSIS — E1140 Type 2 diabetes mellitus with diabetic neuropathy, unspecified: Secondary | ICD-10-CM | POA: Diagnosis not present

## 2024-02-21 DIAGNOSIS — E559 Vitamin D deficiency, unspecified: Secondary | ICD-10-CM | POA: Diagnosis not present

## 2024-02-21 DIAGNOSIS — Z79899 Other long term (current) drug therapy: Secondary | ICD-10-CM | POA: Diagnosis not present

## 2024-02-21 MED ORDER — TRULICITY 4.5 MG/0.5ML ~~LOC~~ SOAJ
4.5000 mg | SUBCUTANEOUS | 3 refills | Status: DC
Start: 2024-02-21 — End: 2024-10-31

## 2024-02-23 DIAGNOSIS — Z79899 Other long term (current) drug therapy: Secondary | ICD-10-CM | POA: Diagnosis not present

## 2024-02-29 ENCOUNTER — Telehealth: Payer: Self-pay | Admitting: *Deleted

## 2024-02-29 NOTE — Telephone Encounter (Signed)
 I called pt who stated she's unsure who called her but she went ahead and scheduled an appt. Next appt is 03/07/24.

## 2024-02-29 NOTE — Telephone Encounter (Signed)
 Communication  Reason for CRM: patient returning missed call, could not find anything in the notes in regards to who called or what it was in reference too, pt cb# [phone]

## 2024-03-04 ENCOUNTER — Other Ambulatory Visit: Payer: Self-pay | Admitting: Physician Assistant

## 2024-03-04 DIAGNOSIS — I10 Essential (primary) hypertension: Secondary | ICD-10-CM

## 2024-03-07 ENCOUNTER — Ambulatory Visit: Payer: 59 | Admitting: Student

## 2024-03-07 VITALS — BP 139/81 | HR 96 | Temp 98.0°F | Ht 67.0 in | Wt 172.5 lb

## 2024-03-07 DIAGNOSIS — M7061 Trochanteric bursitis, right hip: Secondary | ICD-10-CM

## 2024-03-07 DIAGNOSIS — E1165 Type 2 diabetes mellitus with hyperglycemia: Secondary | ICD-10-CM | POA: Diagnosis not present

## 2024-03-07 DIAGNOSIS — Z7984 Long term (current) use of oral hypoglycemic drugs: Secondary | ICD-10-CM

## 2024-03-07 DIAGNOSIS — Z794 Long term (current) use of insulin: Secondary | ICD-10-CM

## 2024-03-07 DIAGNOSIS — Z7985 Long-term (current) use of injectable non-insulin antidiabetic drugs: Secondary | ICD-10-CM | POA: Diagnosis not present

## 2024-03-07 LAB — POCT GLYCOSYLATED HEMOGLOBIN (HGB A1C): Hemoglobin A1C: 7.5 % — AB (ref 4.0–5.6)

## 2024-03-07 LAB — GLUCOSE, CAPILLARY: Glucose-Capillary: 147 mg/dL — ABNORMAL HIGH (ref 70–99)

## 2024-03-07 NOTE — Patient Instructions (Addendum)
 Thank you so much for coming to the clinic today!   We are going to get an xray of your hip, in the meanwhile keep doing those stretches. The number for Dr. August Saucer of ortho is [phone], please give them a phone call and set up an appointment for another injection.   If you have any questions please feel free to the call the clinic at anytime at [phone]. It was a pleasure seeing you!  Best, Dr. Thomasene Ripple

## 2024-03-07 NOTE — Progress Notes (Signed)
 CC: Susan Holmes/R Hip Pain  HPI:  Susan Holmes is a 68 y.o. female living with a history stated below and presents today for Susan Holmes and right hip pain. Please see problem based assessment and plan for additional details.  Past Medical History:  Diagnosis Date   Anxiety    Arthritis    Callus of foot 09/19/2018   Chest pain 09/26/2018   Claudication (HCC) 09/14/2019   Congenital blindness    Left  eye   CONGENITAL BLINDNESS, LEFT EYE 06/12/2007   Qualifier: Diagnosis of   By: Drinkard MSN, FNP-C, Sue         Coronary artery disease    per cardiac cath 2015-- mild disease involving LAD and branches ,  LCFx and branches, and mild disease pRCA   Cough 07/08/2016   Depression    Diabetes mellitus, type II (HCC)    followed by pcp   Erosive gastropathy 12/31/2020   She also had an upper GI endoscopy on 09/28/2013 which revealed a normal esophagus without masses or strictures, esophagitis, eosinophilic esophagitis. There was a few dispersed small erosions found in the prepyloric region of the stomach.      Glaucoma    Glaucoma, both eyes    Hepatitis B infection pt unsure   resolved   History of colonic polyps 05/17/2022   IMO SNOMED Dx Update Oct 2024     History of kidney stones    right stone   History of transient ischemic attack (TIA) 2017   per pt no residuals   History of vitamin D deficiency 09/06/2017   HTN (hypertension)    followed by pcp   Hyperlipidemia    Hyperparathyroidism (HCC) 03/08/2013   Appears to be diagnosed around 02/2013:  PTH 109.9  Vit d 25-OH 02/2013: 19  Dexa 02/2013: Patient's diagnostic category is NORMAL by WHO Criteria. FRACTURE RISK: NOT INCREASED FRAX: Not calculated due to a T score at or above -1.0.    Serum calcium initially 11.8  Ionized calcium elevated 1.45        Liver hemangioma 04/04/2014   Lumbar spondylosis 06/05/2021   Osteoarthritis 12/11/2014   PAD (peripheral artery disease) (HCC)    Paronychia of great toe, left  06/21/2019   Poor dental hygiene    Stroke (HCC)    Tobacco abuse    .5 PPD smoker   Uncontrolled type II diabetes mellitus 06/12/2007   Qualifier: Diagnosis of  By: Drinkard MSN, FNP-C, Fannie Knee      Current Outpatient Medications on File Prior to Visit  Medication Sig Dispense Refill   amLODipine (NORVASC) 10 MG tablet Take 1 tablet (10 mg total) by mouth daily. 90 tablet 3   aspirin EC 81 MG tablet Take 1 tablet (81 mg total) by mouth daily. 90 tablet 1   baclofen (LIORESAL) 10 MG tablet Take 10 mg by mouth 3 (three) times daily as needed.     Blood Glucose Monitoring Suppl (ONETOUCH VERIO) w/Device KIT Use as directed 1 kit 0   buprenorphine (BUTRANS) 10 MCG/HR PTWK 1 patch once a week.     busPIRone (BUSPAR) 10 MG tablet Take 1 tablet (10 mg total) by mouth daily. buspirone 10 mg tablet  TAKE 1 TABLET BY MOUTH THREE TIMES DAILY 90 tablet 0   celecoxib (CELEBREX) 50 MG capsule Take 50 mg by mouth 2 (two) times daily.     diclofenac Sodium (VOLTAREN) 1 % GEL Apply 4 g topically 3 (three) times daily as  needed (pain). 150 g 2   Dulaglutide (TRULICITY) 4.5 MG/0.5ML SOAJ Inject 4.5 mg as directed once a week. 6 mL 3   glucose blood (ONETOUCH VERIO) test strip Use as instructed 100 each 12   hydrOXYzine (ATARAX) 25 MG tablet Take 25 mg by mouth 3 (three) times daily as needed.     insulin glargine (LANTUS SOLOSTAR) 100 UNIT/ML Solostar Pen ADMINISTER 40 UNITS UNDER THE SKIN AT BEDTIME 15 mL 3   insulin lispro (HUMALOG KWIKPEN) 100 UNIT/ML KwikPen Inject 6 Units into the skin 2 (two) times daily with a meal. 15 mL 1   Insulin Pen Needle 32G X 4 MM MISC Use to inject insulin 4 times a day 360 each 3   irbesartan (AVAPRO) 300 MG tablet Take 1 tablet (300 mg total) by mouth daily. 90 tablet 1   lidocaine (LIDODERM) 5 % 1 Patch(s) Topical     LINZESS 290 MCG CAPS capsule Take 290 mcg by mouth daily as needed.     metFORMIN (GLUCOPHAGE XR) 500 MG 24 hr tablet Take 1 tablet (500 mg total) by mouth 2  (two) times daily with a meal. 180 tablet 3   mirtazapine (REMERON) 45 MG tablet Take 1 tablet (45 mg total) by mouth at bedtime. 30 tablet 1   NARCAN 4 MG/0.1ML LIQD nasal spray kit Place 1 spray into the nose once.     OLANZapine (ZYPREXA) 5 MG tablet Take 1 tablet (5 mg total) by mouth at bedtime.     OneTouch Delica Lancets 33G MISC Use with one touch Delica lancing device and Accu chek Guideme meter and Accu Chek guide strips to check your blood sugar Holmes to 3 times a day 100 each 11   oxyCODONE (ROXICODONE) 15 MG immediate release tablet Take 15 mg by mouth every other day as needed for pain.      pantoprazole (PROTONIX) 40 MG tablet Take 40 mg by mouth daily.     prednisoLONE acetate (PRED FORTE) 1 % ophthalmic suspension Apply to eye.     ramelteon (ROZEREM) 8 MG tablet Take 8 mg by mouth at bedtime.     rosuvastatin (CRESTOR) 20 MG tablet Take 1 tablet (20 mg total) by mouth at bedtime. TAKE 1 TABLET(20 MG) BY MOUTH AT BEDTIME 15 tablet 0   traZODone (DESYREL) 100 MG tablet Take 300 mg by mouth at bedtime.     venlafaxine XR (EFFEXOR-XR) 75 MG 24 hr capsule Take 75 mg by mouth every morning.     No current facility-administered medications on file prior to visit.    Family History  Problem Relation Age of Onset   Heart disease Mother 26   Hypertension Mother    CVA Mother    Heart disease Father 15   CVA Sister    Heart disease Sister    Aneurysm Sister    CVA Brother    Aneurysm Brother    CVA Maternal Grandmother    CVA Maternal Grandfather    Hypertension Brother    Colon cancer Neg Hx    Esophageal cancer Neg Hx    Stomach cancer Neg Hx     Social History   Socioeconomic History   Marital status: Divorced    Spouse name: Not on file   Number of children: 1   Years of education: 12   Highest education level: Not on file  Occupational History   Occupation: Disabled  Tobacco Use   Smoking status: Every Day    Current packs/day: 0.50  Average packs/day: 0.5  packs/day for 44.2 years (22.1 ttl pk-yrs)    Types: Cigarettes    Start date: 50   Smokeless tobacco: Current    Types: Chew   Tobacco comments:    6 cigs per day  Vaping Use   Vaping status: Never Used  Substance and Sexual Activity   Alcohol use: No    Alcohol/week: 0.0 standard drinks of alcohol   Drug use: No   Sexual activity: Not Currently    Birth control/protection: Surgical  Other Topics Concern   Not on file  Social History Narrative   Current Social History 08/18/2020        Patient lives alone in a ground floor apartment which is 1 story. There are 13 steps with handrail Holmes to the entrance the patient uses.       Patient's method of transportation is [facility] transportation.      The highest level of education was 2 years college.      The patient currently disabled.      Identified important Relationships are "My daughter, brothers, grands and great-grands."       Pets : chihuahua Catering manager)       Interests / Fun: "I don't"       Current Stressors: "Nothing"       Religious / Personal Beliefs: '[employer]"       Other: "I am a very loving person"      L. Ducatte, BSN, RN-BC        Social Drivers of Health   Financial Resource Strain: Low Risk  (11/10/2022)   Overall Financial Resource Strain (CARDIA)    Difficulty of Paying Living Expenses: Not hard at all  Food Insecurity: No Food Insecurity (11/10/2022)   Hunger Vital Sign    Worried About Running Out of Food in the Last Year: Never true    Ran Out of Food in the Last Year: Never true  Transportation Needs: No Transportation Needs (11/10/2022)   PRAPARE - Administrator, Civil Service (Medical): No    Lack of Transportation (Non-Medical): No  Physical Activity: Inactive (11/10/2022)   Exercise Vital Sign    Days of Exercise per Week: 0 days    Minutes of Exercise per Session: 0 min  Stress: No Stress Concern Present (11/10/2022)   [employer] of Occupational Health - Occupational  Stress Questionnaire    Feeling of Stress : Only a little  Social Connections: Moderately Integrated (11/10/2022)   Social Connection and Isolation Panel [NHANES]    Frequency of Communication with Friends and Family: More than three times a week    Frequency of Social Gatherings with Friends and Family: Once a week    Attends Religious Services: More than 4 times per year    Active Member of [employer] or Organizations: No    Attends Engineer, structural: More than 4 times per year    Marital Status: Divorced  Catering manager Violence: Not At Risk (11/10/2022)   Humiliation, Afraid, Rape, and Kick questionnaire    Fear of Current or Ex-Partner: No    Emotionally Abused: No    Physically Abused: No    Sexually Abused: No    Review of Systems: ROS negative except for what is noted on the assessment and plan.  Vitals:   03/07/24 1011  BP: 139/81  Pulse: 96  Temp: 98 F (36.7 C)  TempSrc: Oral  SpO2: 99%  Weight: 172 lb 8 oz (78.2 kg)  Height: 5\' 7"  (1.702 m)    Physical Exam: Constitutional: well-appearing female in no acute distress  Cardiovascular: regular rate and rhythm, no m/r/g Pulmonary/Chest: normal work of breathing on room air, lungs clear to auscultation bilaterally Abdominal: soft, non-tender, non-distended MSK: normal bulk and tone, tenderness on right trochanter, decreased range of motion secondary to pain on flexion and abduction of hip.  Straight leg test negative bilaterally. Neurological: alert & oriented x 3, 5/5 strength in bilateral upper and lower extremities, normal gait   Assessment & Plan:   Type 2 diabetes mellitus (HCC) Susan has decreased to 7.5 from 10.9.  She has been using Humalog 6 mg twice a day, as well as Lantus 44 units daily, and metformin ER 500 mg twice a day, and Trulicity 4.5 mg weekly.  She did not bring her glucometer in, but states that when she does check her sugars usually in the 140-150 range I congratulated her that her  Susan was improving today.  At this time, will not make any changes to her regimen, can hopefully start weaning insulin off at next visit.  Plan: - Continue Lantus 44 units daily - Continue metformin ER 500 mg twice a day - Continue Trulicity 4.5 mg - Urine microalbumin checked today   Trochanteric bursitis of right hip Per chart review, seems like patient has been dealing with this right hip pain for quite some time.  She has a working diagnosis of trochanteric bursitis, she does state that her pain was relieved with the injections however she has still not gone to the orthopedic office.  I provided her with number for the orthopedic office she states that she will call.  On my exam, there is tenderness over the trochanter, and she has decreased range of motion secondary to pain on flexion and abduction of the hip.  She does say that her pain is unchanged in nature as it is now shooting down her right leg on the lateral side.  Straight leg test is negative.  Given worsening of pain, and suspected possible AVN, will get imaging with the right hip x-ray initially.  She is already on extensive pain regimen (follow-Holmes outpatient with pain doctor), so we will hold off on prescribing any new pain medications for her.  I think she would benefit from hip strengthening exercises, so we will send in referral for physical therapy.  Plan: - Right hip x-ray - Referral to physical therapy - Number provided again for orthopedic office   Patient discussed with Dr. Pauline Good Jamyrah Saur, M.D. [facility] Internal Medicine, PGY-2 Pager: [phone] Date 03/07/2024 Time 1:05 PM

## 2024-03-07 NOTE — Assessment & Plan Note (Addendum)
 Per chart review, seems like patient has been dealing with this right hip pain for quite some time.  She has a working diagnosis of trochanteric bursitis, she does state that her pain was relieved with the injections however she has still not gone to the orthopedic office.  I provided her with number for the orthopedic office she states that she will call.  On my exam, there is tenderness over the trochanter, and she has decreased range of motion secondary to pain on flexion and abduction of the hip.  She does say that her pain is unchanged in nature as it is now shooting down her right leg on the lateral side.  Straight leg test is negative.  Given worsening of pain, and suspected possible AVN, will get imaging with the right hip x-ray initially.  She is already on extensive pain regimen (follow-up outpatient with pain doctor), so we will hold off on prescribing any new pain medications for her.  I think she would benefit from hip strengthening exercises, so we will send in referral for physical therapy.  Plan: - Right hip x-ray - Referral to physical therapy - Number provided again for orthopedic office

## 2024-03-07 NOTE — Assessment & Plan Note (Signed)
 A1c has decreased to 7.5 from 10.9.  She has been using Humalog 6 mg twice a day, as well as Lantus 44 units daily, and metformin ER 500 mg twice a day, and Trulicity 4.5 mg weekly.  She did not bring her glucometer in, but states that when she does check her sugars usually in the 140-150 range I congratulated her that her A1c was improving today.  At this time, will not make any changes to her regimen, can hopefully start weaning insulin off at next visit.  Plan: - Continue Lantus 44 units daily - Continue metformin ER 500 mg twice a day - Continue Trulicity 4.5 mg - Urine microalbumin checked today

## 2024-03-08 LAB — MICROALBUMIN / CREATININE URINE RATIO
Creatinine, Urine: 124.2 mg/dL
Microalb/Creat Ratio: 8 mg/g{creat} (ref 0–29)
Microalbumin, Urine: 10.1 ug/mL

## 2024-03-14 NOTE — Progress Notes (Signed)
 Internal Medicine Clinic Attending  Case discussed with the resident at the time of the visit.  We reviewed the resident's history and exam and pertinent patient test results.  I agree with the assessment, diagnosis, and plan of care documented in the resident's note.

## 2024-03-16 DIAGNOSIS — H53022 Refractive amblyopia, left eye: Secondary | ICD-10-CM | POA: Diagnosis not present

## 2024-03-16 DIAGNOSIS — H04123 Dry eye syndrome of bilateral lacrimal glands: Secondary | ICD-10-CM | POA: Diagnosis not present

## 2024-03-16 DIAGNOSIS — H3581 Retinal edema: Secondary | ICD-10-CM | POA: Diagnosis not present

## 2024-03-16 DIAGNOSIS — Z961 Presence of intraocular lens: Secondary | ICD-10-CM | POA: Diagnosis not present

## 2024-03-16 DIAGNOSIS — H401113 Primary open-angle glaucoma, right eye, severe stage: Secondary | ICD-10-CM | POA: Diagnosis not present

## 2024-03-16 DIAGNOSIS — E119 Type 2 diabetes mellitus without complications: Secondary | ICD-10-CM | POA: Diagnosis not present

## 2024-03-16 DIAGNOSIS — H401122 Primary open-angle glaucoma, left eye, moderate stage: Secondary | ICD-10-CM | POA: Diagnosis not present

## 2024-03-20 ENCOUNTER — Telehealth: Payer: Self-pay | Admitting: *Deleted

## 2024-03-20 DIAGNOSIS — E559 Vitamin D deficiency, unspecified: Secondary | ICD-10-CM | POA: Diagnosis not present

## 2024-03-20 DIAGNOSIS — Z79899 Other long term (current) drug therapy: Secondary | ICD-10-CM | POA: Diagnosis not present

## 2024-03-20 DIAGNOSIS — Z9181 History of falling: Secondary | ICD-10-CM | POA: Diagnosis not present

## 2024-03-20 DIAGNOSIS — M5416 Radiculopathy, lumbar region: Secondary | ICD-10-CM | POA: Diagnosis not present

## 2024-03-20 DIAGNOSIS — Z794 Long term (current) use of insulin: Secondary | ICD-10-CM | POA: Diagnosis not present

## 2024-03-20 DIAGNOSIS — F17210 Nicotine dependence, cigarettes, uncomplicated: Secondary | ICD-10-CM | POA: Diagnosis not present

## 2024-03-20 DIAGNOSIS — K5909 Other constipation: Secondary | ICD-10-CM | POA: Diagnosis not present

## 2024-03-20 DIAGNOSIS — E1140 Type 2 diabetes mellitus with diabetic neuropathy, unspecified: Secondary | ICD-10-CM | POA: Diagnosis not present

## 2024-03-20 NOTE — Telephone Encounter (Signed)
 Call from patient requesting call back about her lab results.

## 2024-03-21 ENCOUNTER — Other Ambulatory Visit: Payer: Self-pay | Admitting: Internal Medicine

## 2024-03-21 ENCOUNTER — Ambulatory Visit: Payer: 59

## 2024-03-21 ENCOUNTER — Telehealth: Payer: Self-pay | Admitting: Internal Medicine

## 2024-03-21 VITALS — Ht 67.0 in | Wt 170.0 lb

## 2024-03-21 DIAGNOSIS — Z0000 Encounter for general adult medical examination without abnormal findings: Secondary | ICD-10-CM | POA: Diagnosis not present

## 2024-03-21 DIAGNOSIS — Z1231 Encounter for screening mammogram for malignant neoplasm of breast: Secondary | ICD-10-CM

## 2024-03-21 NOTE — Progress Notes (Signed)
 Order placed for mammogram.

## 2024-03-21 NOTE — Telephone Encounter (Signed)
 This pt is due now overdue for her Yearly mammogram.  Please place a new order for this patient and we will get her scheduled:  Her last Mammogram was as following:  MM 3D SCREEN BREAST BILATERAL (Accession [id number]) (Order [id number]) Imaging Date: 01/05/2023 Department: [facility]

## 2024-03-21 NOTE — Progress Notes (Signed)
 Because this visit was a virtual/telehealth visit,  certain criteria was not obtained, such a blood pressure, CBG if applicable, and timed get up and go. Any medications not marked as "taking" were not mentioned during the medication reconciliation part of the visit. Any vitals not documented were not able to be obtained due to this being a telehealth visit or patient was unable to self-report a recent blood pressure reading due to a lack of equipment at home via telehealth. Vitals that have been documented are verbally provided by the patient.   Subjective:   Susan Holmes is a 68 y.o. who presents for a Medicare Wellness preventive visit.  Visit Complete: Virtual I connected with  Susan Holmes on 03/21/24 by a audio enabled telemedicine application and verified that I am speaking with the correct person using two identifiers.  Patient Location: Home  Provider Location: Office/Clinic  I discussed the limitations of evaluation and management by telemedicine. The patient expressed understanding and agreed to proceed.  Vital Signs: Because this visit was a virtual/telehealth visit, some criteria may be missing or patient reported. Any vitals not documented were not able to be obtained and vitals that have been documented are patient reported.  VideoDeclined- This patient declined Librarian, academic. Therefore the visit was completed with audio only.  Persons Participating in Visit: Patient.  AWV Questionnaire: No: Patient Medicare AWV questionnaire was not completed prior to this visit.  Cardiac Risk Factors include: advanced age (>47men, >41 women);sedentary lifestyle;diabetes mellitus;dyslipidemia;family history of premature cardiovascular disease;hypertension;smoking/ tobacco exposure     Objective:    Today's Vitals   03/21/24 1044  Weight: 170 lb (77.1 kg)  Height: 5\' 7"  (1.702 m)  PainSc: 8   PainLoc: Hip   Body mass index is 26.63 kg/m.      03/21/2024   10:46 AM 10/12/2023   10:11 AM 09/28/2023    9:53 AM 05/04/2023   10:51 AM 04/08/2023   10:35 AM 11/10/2022   10:18 AM 06/22/2022   11:55 AM  Advanced Directives  Does Patient Have a Medical Advance Directive? No No No No No No No  Would patient like information on creating a medical advance directive? No - Patient declined No - Patient declined No - Patient declined No - Patient declined No - Patient declined No - Patient declined No - Patient declined    Current Medications (verified) Outpatient Encounter Medications as of 03/21/2024  Medication Sig   amLODipine (NORVASC) 10 MG tablet Take 1 tablet (10 mg total) by mouth daily.   aspirin EC 81 MG tablet Take 1 tablet (81 mg total) by mouth daily.   baclofen (LIORESAL) 10 MG tablet Take 10 mg by mouth 3 (three) times daily as needed.   Blood Glucose Monitoring Suppl (ONETOUCH VERIO) w/Device KIT Use as directed   buprenorphine (BUTRANS) 10 MCG/HR PTWK 1 patch once a week.   busPIRone (BUSPAR) 10 MG tablet Take 1 tablet (10 mg total) by mouth daily. buspirone 10 mg tablet  TAKE 1 TABLET BY MOUTH THREE TIMES DAILY   celecoxib (CELEBREX) 50 MG capsule Take 50 mg by mouth 2 (two) times daily.   diclofenac Sodium (VOLTAREN) 1 % GEL Apply 4 g topically 3 (three) times daily as needed (pain).   Dulaglutide (TRULICITY) 4.5 MG/0.5ML SOAJ Inject 4.5 mg as directed once a week.   glucose blood (ONETOUCH VERIO) test strip Use as instructed   hydrOXYzine (ATARAX) 25 MG tablet Take 25 mg by mouth 3 (three) times  daily as needed.   insulin glargine (LANTUS SOLOSTAR) 100 UNIT/ML Solostar Pen ADMINISTER 40 UNITS UNDER THE SKIN AT BEDTIME   insulin lispro (HUMALOG KWIKPEN) 100 UNIT/ML KwikPen Inject 6 Units into the skin 2 (two) times daily with a meal.   Insulin Pen Needle 32G X 4 MM MISC Use to inject insulin 4 times a day   irbesartan (AVAPRO) 300 MG tablet Take 1 tablet (300 mg total) by mouth daily.   lidocaine (LIDODERM) 5 % 1 Patch(s)  Topical   LINZESS 290 MCG CAPS capsule Take 290 mcg by mouth daily as needed.   metFORMIN (GLUCOPHAGE XR) 500 MG 24 hr tablet Take 1 tablet (500 mg total) by mouth 2 (two) times daily with a meal.   mirtazapine (REMERON) 45 MG tablet Take 1 tablet (45 mg total) by mouth at bedtime.   NARCAN 4 MG/0.1ML LIQD nasal spray kit Place 1 spray into the nose once.   OLANZapine (ZYPREXA) 5 MG tablet Take 1 tablet (5 mg total) by mouth at bedtime.   OneTouch Delica Lancets 33G MISC Use with one touch Delica lancing device and Accu chek Guideme meter and Accu Chek guide strips to check your blood sugar up to 3 times a day   oxyCODONE (ROXICODONE) 15 MG immediate release tablet Take 15 mg by mouth every other day as needed for pain.    pantoprazole (PROTONIX) 40 MG tablet Take 40 mg by mouth daily.   prednisoLONE acetate (PRED FORTE) 1 % ophthalmic suspension Apply to eye.   ramelteon (ROZEREM) 8 MG tablet Take 8 mg by mouth at bedtime.   rosuvastatin (CRESTOR) 20 MG tablet Take 1 tablet (20 mg total) by mouth at bedtime. TAKE 1 TABLET(20 MG) BY MOUTH AT BEDTIME   traZODone (DESYREL) 100 MG tablet Take 300 mg by mouth at bedtime.   venlafaxine XR (EFFEXOR-XR) 75 MG 24 hr capsule Take 75 mg by mouth every morning.   No facility-administered encounter medications on file as of 03/21/2024.    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 There was a few dispersed small erosions found in the prepyloric region of the stomach.      Glaucoma    Glaucoma, both eyes    Hepatitis B infection pt unsure   resolved   History of colonic polyps 05/17/2022   IMO SNOMED Dx Update Oct 2024     History of kidney stones    right stone   History of transient ischemic attack (TIA) 2017   per pt no residuals   History of vitamin D deficiency 09/06/2017   HTN (hypertension)    followed by pcp   Hyperlipidemia  Hyperparathyroidism (HCC) 03/08/2013   Appears to be diagnosed around 02/2013:  PTH 109.9  Vit d 25-OH 02/2013: 19  Dexa 02/2013: Patient's diagnostic category is NORMAL by WHO Criteria. FRACTURE RISK: NOT INCREASED FRAX: Not calculated due to a T score at or above -1.0.    Serum calcium initially 11.8  Ionized calcium elevated 1.45        Liver hemangioma 04/04/2014   Lumbar spondylosis 06/05/2021   Osteoarthritis 12/11/2014   PAD (peripheral artery disease) (HCC)    Paronychia of great toe, left 06/21/2019   Poor dental hygiene    Stroke (HCC)    Tobacco abuse    .5 PPD smoker   Uncontrolled type II diabetes mellitus 06/12/2007   Qualifier: Diagnosis of  By: Drinkard MSN, FNP-C, Fannie Knee     Past Surgical History:  Procedure Laterality Date   ABDOMINAL AORTOGRAM W/LOWER EXTREMITY N/A 09/14/2019   Procedure: ABDOMINAL AORTOGRAM W/LOWER EXTREMITY;  Surgeon: Sherren Kerns, MD;  Location: [facility] INVASIVE CV LAB;  Service: Cardiovascular;  Laterality: N/A;   ABDOMINAL HYSTERECTOMY  1980s   unsure if ovaries were taken   BYPASS GRAFT Left 10/22/2019    Left femoral to above-knee popliteal bypass using reversed ipsilateral greater saphenous vein   colonoscopy     FEMORAL-POPLITEAL BYPASS GRAFT Left 10/22/2019   Procedure: LEFT FEMORAL-ABOVE KNEE POPLITEAL   BYPASS GRAFT with REVERSED GREATER SAPHENOUS VEIN;  Surgeon: Sherren Kerns, MD;  Location: [facility] OR;  Service: Vascular;  Laterality: Left;   GLAUCOMA SURGERY Right 2017   IR URETERAL STENT RIGHT NEW ACCESS W/O SEP NEPHROSTOMY CATH  10/03/2018   LEFT HEART CATHETERIZATION WITH CORONARY ANGIOGRAM N/A 10/09/2014   Procedure: LEFT HEART CATHETERIZATION WITH CORONARY ANGIOGRAM;  Surgeon: Lennette Bihari, MD;  Location[facility] CATH LAB;  Service: Cardiovascular;  Laterality: N/A;   NEPHROLITHOTOMY Right 10/03/2018   Procedure: RIGHT NEPHROLITHOTOMY PERCUTANEOUS FIRST STAGE;  Surgeon: Bjorn Pippin, MD;  Location: [facility] ORS;  Service: Urology;  Laterality: Right;   PERIPHERAL VASCULAR INTERVENTION Left 09/14/2019   Procedure: PERIPHERAL VASCULAR INTERVENTION;  Surgeon: Sherren Kerns, MD;  Location: [facility] INVASIVE CV LAB;  Service: Cardiovascular;  Laterality: Left;   left external iliac stent    TEE WITHOUT CARDIOVERSION N/A 08/17/2016   Procedure: TRANSESOPHAGEAL ECHOCARDIOGRAM (TEE);  Surgeon: Quintella Reichert, MD;  Location: [facility] ENDOSCOPY;  Service: Cardiovascular;  Laterality: N/A;   Family History  Problem Relation Age of Onset   Heart disease Mother 68   Hypertension Mother    CVA Mother    Heart disease Father 5   CVA Sister    Heart disease Sister    Aneurysm Sister    CVA Brother    Aneurysm Brother    CVA Maternal Grandmother    CVA Maternal Grandfather    Hypertension Brother    Colon cancer Neg Hx    Esophageal cancer Neg Hx    Stomach cancer Neg Hx    Social History   Socioeconomic History   Marital status: Divorced    Spouse name: Not on file   Number of children: 1   Years of education: 12   Highest education level: Not on file  Occupational History   Occupation: Disabled  Tobacco Use   Smoking status: Every Day    Current packs/day: 0.50    Average packs/day: 0.5 packs/day for 44.2 years (22.1 ttl pk-yrs)    Types: Cigarettes    Start date: 17   Smokeless tobacco:  Current    Types:  Chew   Tobacco comments:    6 cigs per day  Vaping Use   Vaping status: Never Used  Substance and Sexual Activity   Alcohol use: No    Alcohol/week: 0.0 standard drinks of alcohol   Drug use: No   Sexual activity: Not Currently    Birth control/protection: Surgical  Other Topics Concern   Not on file  Social History Narrative   Current Social History 08/18/2020        Patient lives alone in a ground floor apartment which is 1 story. There are 13 steps with handrail up to the entrance the patient uses.       Patient's method of transportation is [facility] transportation.      The highest level of education was 2 years college.      The patient currently disabled.      Identified important Relationships are "My daughter, brothers, grands and great-grands."       Pets : chihuahua Catering manager)       Interests / Fun: "I don't"       Current Stressors: "Nothing"       Religious / Personal Beliefs: 'Baptist"       Other: "I am a very loving person"      L. Ducatte, BSN, RN-BC        Social Drivers of Health   Financial Resource Strain: Low Risk  (03/21/2024)   Overall Financial Resource Strain (CARDIA)    Difficulty of Paying Living Expenses: Not hard at all  Food Insecurity: No Food Insecurity (03/21/2024)   Hunger Vital Sign    Worried About Running Out of Food in the Last Year: Never true    Ran Out of Food in the Last Year: Never true  Transportation Needs: No Transportation Needs (03/21/2024)   PRAPARE - Administrator, Civil Service (Medical): No    Lack of Transportation (Non-Medical): No  Physical Activity: Inactive (03/21/2024)   Exercise Vital Sign    Days of Exercise per Week: 0 days    Minutes of Exercise per Session: 0 min  Stress: No Stress Concern Present (03/21/2024)   [employer] of Occupational Health - Occupational Stress Questionnaire    Feeling of Stress : Not at all  Social Connections: Moderately Integrated (03/21/2024)    Social Connection and Isolation Panel [NHANES]    Frequency of Communication with Friends and Family: More than three times a week    Frequency of Social Gatherings with Friends and Family: Once a week    Attends Religious Services: More than 4 times per year    Active Member of [employer] or Organizations: No    Attends Engineer, structural: More than 4 times per year    Marital Status: Divorced    Tobacco Counseling Ready to quit: Not Answered Counseling given: Not Answered Tobacco comments: 6 cigs per day    Clinical Intake:  Pre-visit preparation completed: Yes  Pain : 0-10 Pain Score: 8  Pain Type: Chronic pain Pain Location: Hip Pain Orientation: Right Pain Onset: More than a month ago Pain Frequency: Intermittent Pain Relieving Factors: OXYCODONE Effect of Pain on Daily Activities: Pain can diminish job performance, lower motivation to exercise and prevent you from completing daily tasks.  Pain produces disability and affects the quality of life.  Pain Relieving Factors: OXYCODONE  BMI - recorded: 26.63 Nutritional Status: BMI 25 -29 Overweight Nutritional Risks: None Diabetes: Yes CBG done?: No Did pt. bring in CBG monitor from  home?: No  Lab Results  Component Value Date   HGBA1C 7.5 (A) 03/07/2024   HGBA1C 10.9 (A) 09/28/2023   HGBA1C 5.5 04/08/2023     How often do you need to have someone help you when you read instructions, pamphlets, or other written materials from your doctor or pharmacy?: 1 - Never What is the last grade level you completed in school?: 2 YEARS OF COLLEGE  Interpreter Needed?: No  Information entered by :: Susie Cassette, LPN.   Activities of Daily Living     03/21/2024   10:46 AM 10/12/2023   10:10 AM  In your present state of health, do you have any difficulty performing the following activities:  Hearing? 0 0  Vision? 0 0  Difficulty concentrating or making decisions? 0 0  Walking or climbing stairs? 0 0  Dressing  or bathing? 0 0  Doing errands, shopping? 0 0  Preparing Food and eating ? N   Using the Toilet? N   In the past six months, have you accidently leaked urine? N   Do you have problems with loss of bowel control? N   Managing your Medications? N   Managing your Finances? N   Housekeeping or managing your Housekeeping? N     Patient Care Team: Mercie Eon, MD as PCP - General (Internal Medicine) Chilton Si, MD as PCP - Cardiology (Cardiology) Sallye Lat, MD as Consulting Physician (Ophthalmology) August Saucer Corrie Mckusick, MD as Consulting Physician (Orthopedic Surgery) Willis Modena, MD as Consulting Physician (Gastroenterology) Newman Pies, MD as Consulting Physician (Otolaryngology)  Indicate any recent Medical Services you may have received from other than [facility] providers in the past year (date may be approximate).     Assessment:   This is a routine wellness examination for Keela.  Hearing/Vision screen Hearing Screening - Comments:: Patient has bilateral hearing loss. No hearing aids.  Vision Screening - Comments:: Wears rx glasses - last routine eye exams with Sallye Lat, MD.    Goals Addressed   None    Depression Screen     03/21/2024   10:46 AM 10/12/2023   10:13 AM 09/28/2023   10:40 AM 11/10/2022   10:16 AM 06/22/2022   11:54 AM 06/22/2022    9:12 AM 09/23/2021    1:20 PM  PHQ 2/9 Scores  PHQ - 2 Score 0 0 0 4 2 2 1   PHQ- 9 Score 0   9 7 7      Fall Risk     03/21/2024   10:46 AM 10/12/2023   10:10 AM 09/28/2023   10:42 AM 05/04/2023   10:13 AM 04/08/2023   10:38 AM  Fall Risk   Falls in the past year? 0 0 0 0 0  Number falls in past yr: 0 0 0 0 0  Injury with Fall? 0 0 0 0 0  Risk for fall due to : No Fall Risks      Follow up Falls prevention discussed;Falls evaluation completed Falls evaluation completed Falls evaluation completed Falls evaluation completed Falls evaluation completed    MEDICARE RISK AT HOME:  Medicare Risk at  Home Any stairs in or around the home?: Yes If so, are there any without handrails?: No Home free of loose throw rugs in walkways, pet beds, electrical cords, etc?: Yes Adequate lighting in your home to reduce risk of falls?: Yes Life alert?: No Use of a cane, walker or w/c?: No Grab bars in the bathroom?: Yes Shower chair or bench in shower?: No Elevated  toilet seat or a handicapped toilet?: No  TIMED UP AND GO:  Was the test performed?  No  Cognitive Function: 6CIT completed    03/21/2024   10:52 AM  MMSE - Mini Mental State Exam  Not completed: Unable to complete        03/21/2024   10:52 AM 06/22/2022    9:24 AM  6CIT Screen  What Year? 0 points 0 points  What month? 0 points 0 points  What time? 0 points 0 points  Count back from 20 0 points 0 points  Months in reverse 0 points 0 points  Repeat phrase 0 points 0 points  Total Score 0 points 0 points    Immunizations Immunization History  Administered Date(s) Administered   Fluad Quad(high Dose 65+) 09/23/2021   Influenza Whole 01/27/2007   Influenza, Seasonal, Injecte, Preservative Fre 12/01/2012   Influenza,inj,Quad PF,6+ Mos 11/08/2014, 10/06/2015, 10/26/2016, 09/06/2017, 10/10/2019, 09/29/2020   Influenza-Unspecified 10/27/2018, 09/21/2023   Moderna SARS-COV2 Booster Vaccination 10/25/2020   PNEUMOCOCCAL CONJUGATE-20 06/22/2022   Pneumococcal Conjugate-13 08/18/2020   Pneumococcal Polysaccharide-23 06/28/2005   Td 02/25/2004   Tdap 06/13/2018    Screening Tests Health Maintenance  Topic Date Due   Zoster Vaccines- Shingrix (1 of 2) Never done   Lung Cancer Screening  05/24/2020   COVID-19 Vaccine (1) 10/25/2020   OPHTHALMOLOGY EXAM  08/11/2023   FOOT EXAM  11/11/2023   HEMOGLOBIN A1C  06/07/2024   Diabetic kidney evaluation - eGFR measurement  09/27/2024   LIPID PANEL  09/27/2024   MAMMOGRAM  01/05/2025   Diabetic kidney evaluation - Urine ACR  03/07/2025   Medicare Annual Wellness (AWV)   03/21/2025   DTaP/Tdap/Td (3 - Td or Tdap) 06/13/2028   Colonoscopy  02/17/2033   Pneumonia Vaccine 69+ Years old  Completed   INFLUENZA VACCINE  Completed   DEXA SCAN  Completed   Hepatitis C Screening  Completed   HPV VACCINES  Aged Out    Health Maintenance  Health Maintenance Due  Topic Date Due   Zoster Vaccines- Shingrix (1 of 2) Never done   Lung Cancer Screening  05/24/2020   COVID-19 Vaccine (1) 10/25/2020   OPHTHALMOLOGY EXAM  08/11/2023   FOOT EXAM  11/11/2023   Health Maintenance Items Addressed: Patient is due for the following: Shingrix and Covid vaccine, Diabetic Eye and Foot Exam, and Lung Cancer Screening.  Additional Screening:  Vision Screening: Recommended annual ophthalmology exams for early detection of glaucoma and other disorders of the eye.  Dental Screening: Recommended annual dental exams for proper oral hygiene  Community Resource Referral / Chronic Care Management: CRR required this visit?  No   CCM required this visit?  No     Plan:     I have personally reviewed and noted the following in the patient's chart:   Medical and social history Use of alcohol, tobacco or illicit drugs  Current medications and supplements including opioid prescriptions. Patient is currently taking opioid prescriptions. Information provided to patient regarding non-opioid alternatives. Patient advised to discuss non-opioid treatment plan with their provider. Functional ability and status Nutritional status Physical activity Advanced directives List of other physicians Hospitalizations, surgeries, and ER visits in previous 12 months Vitals Screenings to include cognitive, depression, and falls Referrals and appointments  In addition, I have reviewed and discussed with patient certain preventive protocols, quality metrics, and best practice recommendations. A written personalized care plan for preventive services as well as general preventive health  recommendations were provided to patient.  Mickeal Needy, LPN   1/61/0960   After Visit Summary: (Declined) Due to this being a telephonic visit, with patients personalized plan was offered to patient but patient Declined AVS at this time   Notes: Please refer to Routing Comments.

## 2024-03-21 NOTE — Patient Instructions (Signed)
 Susan Holmes , Thank you for taking time to come for your Medicare Wellness Visit. I appreciate your ongoing commitment to your health goals. Please review the following plan we discussed and let me know if I can assist you in the future.   Referrals/Orders/Follow-Ups/Clinician Recommendations: Yes; Keep maintaining your health by keeping your appointments with Dr. Mercie Eon and any specialists that you may see.  Call us if you need anything.  Have a great year!!!!  This is a list of the screening recommended for you and due dates:  Health Maintenance  Topic Date Due   Zoster (Shingles) Vaccine (1 of 2) Never done   Screening for Lung Cancer  05/24/2020   COVID-19 Vaccine (1) 10/25/2020   Eye exam for diabetics  08/11/2023   Complete foot exam   11/11/2023   Hemoglobin A1C  06/07/2024   Yearly kidney function blood test for diabetes  09/27/2024   Lipid (cholesterol) test  09/27/2024   Mammogram  01/05/2025   Yearly kidney health urinalysis for diabetes  03/07/2025   Medicare Annual Wellness Visit  03/21/2025   DTaP/Tdap/Td vaccine (3 - Td or Tdap) 06/13/2028   Colon Cancer Screening  02/17/2033   Pneumonia Vaccine  Completed   Flu Shot  Completed   DEXA scan (bone density measurement)  Completed   Hepatitis C Screening  Completed   HPV Vaccine  Aged Out    Advanced directives: (Declined) Advance directive discussed with you today. Even though you declined this today, please call our office should you change your mind, and we can give you the proper paperwork for you to fill out.  Next Medicare Annual Wellness Visit scheduled for next year: Yes

## 2024-03-22 ENCOUNTER — Telehealth: Payer: Self-pay | Admitting: *Deleted

## 2024-03-22 DIAGNOSIS — H401113 Primary open-angle glaucoma, right eye, severe stage: Secondary | ICD-10-CM | POA: Diagnosis not present

## 2024-03-22 DIAGNOSIS — H30041 Focal chorioretinal inflammation, macular or paramacular, right eye: Secondary | ICD-10-CM | POA: Diagnosis not present

## 2024-03-22 DIAGNOSIS — H401122 Primary open-angle glaucoma, left eye, moderate stage: Secondary | ICD-10-CM | POA: Diagnosis not present

## 2024-03-22 DIAGNOSIS — Z79899 Other long term (current) drug therapy: Secondary | ICD-10-CM | POA: Diagnosis not present

## 2024-03-22 DIAGNOSIS — H33191 Other retinoschisis and retinal cysts, right eye: Secondary | ICD-10-CM | POA: Diagnosis not present

## 2024-03-22 DIAGNOSIS — H35351 Cystoid macular degeneration, right eye: Secondary | ICD-10-CM | POA: Diagnosis not present

## 2024-03-22 NOTE — Telephone Encounter (Signed)
 Unable to contact patient by phone no voice mail set up/ this call was in regards to getting her mammogram scheduled.

## 2024-03-23 ENCOUNTER — Telehealth: Payer: Self-pay

## 2024-03-23 NOTE — Telephone Encounter (Signed)
 Patient was identified as falling into the [place] Measure - Diabetes.   Patient was: Appointment scheduled with primary care provider in the next 30 days.

## 2024-03-26 ENCOUNTER — Encounter: Payer: Self-pay | Admitting: Podiatry

## 2024-03-26 ENCOUNTER — Ambulatory Visit (INDEPENDENT_AMBULATORY_CARE_PROVIDER_SITE_OTHER): Payer: 59 | Admitting: Podiatry

## 2024-03-26 DIAGNOSIS — M79674 Pain in right toe(s): Secondary | ICD-10-CM

## 2024-03-26 DIAGNOSIS — B351 Tinea unguium: Secondary | ICD-10-CM

## 2024-03-26 DIAGNOSIS — M79675 Pain in left toe(s): Secondary | ICD-10-CM | POA: Diagnosis not present

## 2024-03-26 DIAGNOSIS — L84 Corns and callosities: Secondary | ICD-10-CM

## 2024-03-26 DIAGNOSIS — E1151 Type 2 diabetes mellitus with diabetic peripheral angiopathy without gangrene: Secondary | ICD-10-CM | POA: Diagnosis not present

## 2024-03-26 NOTE — Progress Notes (Signed)
This patient returns to my office for at risk foot care.  This patient requires this care by a professional since this patient will be at risk due to having diabetes.  This patient is unable to cut nails himself since the patient cannot reach his nails.These nails are painful walking and wearing shoes.  This patient presents for at risk foot care today.  General Appearance  Alert, conversant and in no acute stress.  Vascular  Dorsalis pedis and posterior tibial  pulses are palpable  bilaterally.  Capillary return is within normal limits  bilaterally. Temperature is within normal limits  bilaterally.  Neurologic  Senn-Weinstein monofilament wire test within normal limits  bilaterally. Muscle power within normal limits bilaterally.  Nails Thick disfigured discolored nails with subungual debris  from hallux to fifth toes bilaterally. No evidence of bacterial infection or drainage bilaterally.  Orthopedic  No limitations of motion  feet .  No crepitus or effusions noted.  No bony pathology or digital deformities noted.  Skin  normotropic skin noted bilaterally.  No signs of infections or ulcers noted.   Callus sub 2 right foot.  Onychomycosis  Pain in right toes  Pain in left toes   Porokeratosis right forefoot.  Consent was obtained for treatment procedures.   Mechanical debridement of nails 1-5  bilaterally performed with a nail nipper.  Filed with dremel without incident. Debride porokeratosis with # 15 blade and dremel tool.   Return office visit    3 months                  Told patient to return for periodic foot care and evaluation due to potential at risk complications.   Gardiner Barefoot DPM

## 2024-03-27 ENCOUNTER — Encounter: Payer: Self-pay | Admitting: Physical Therapy

## 2024-03-27 ENCOUNTER — Telehealth: Payer: Self-pay | Admitting: *Deleted

## 2024-03-27 ENCOUNTER — Other Ambulatory Visit: Payer: Self-pay

## 2024-03-27 ENCOUNTER — Ambulatory Visit: Attending: Internal Medicine | Admitting: Physical Therapy

## 2024-03-27 DIAGNOSIS — M6281 Muscle weakness (generalized): Secondary | ICD-10-CM | POA: Diagnosis not present

## 2024-03-27 DIAGNOSIS — R2689 Other abnormalities of gait and mobility: Secondary | ICD-10-CM | POA: Insufficient documentation

## 2024-03-27 DIAGNOSIS — M7061 Trochanteric bursitis, right hip: Secondary | ICD-10-CM | POA: Diagnosis not present

## 2024-03-27 DIAGNOSIS — M25551 Pain in right hip: Secondary | ICD-10-CM | POA: Diagnosis not present

## 2024-03-27 NOTE — Telephone Encounter (Signed)
 Mammogram appt. April 24.2025 @ 9:50 am- arrive 9:30 am/ breast center. Appointment mailed to patient with 75.00 no show fee information attached. Called patient multiple of times no answer unable to leave message voice mail not set up / left voice message with daughter regarding appointment.

## 2024-03-27 NOTE — Therapy (Signed)
 OUTPATIENT PHYSICAL THERAPY LOWER EXTREMITY EVALUATION  Patient Name: Susan Holmes MRN: [id number] DOB:07-22-1956, 68 y.o., female Today's Date: 03/27/2024   PT End of Session - 03/27/24 1124     Visit Number 1    Number of Visits --   1-2x/week   Date for PT Re-Evaluation 05/22/24    Authorization Type UHC MCR/MCD - LEFS    Progress Note Due on Visit 10    PT Start Time 1045    PT Stop Time 1116    PT Time Calculation (min) 31 min             Past Medical History:  Diagnosis Date   Anxiety    Arthritis    Callus of foot 09/19/2018   Chest pain 09/26/2018   Claudication (HCC) 09/14/2019   Congenital blindness    Left  eye   CONGENITAL BLINDNESS, LEFT EYE 06/12/2007   Qualifier: Diagnosis of   By: Drinkard MSN, FNP-C, Sue         Coronary artery disease    per cardiac cath 2015-- mild disease involving LAD and branches ,  LCFx and branches, and mild disease pRCA   Cough 07/08/2016   Depression    Diabetes mellitus, type II (HCC)    followed by pcp   Erosive gastropathy 12/31/2020   She also had an upper GI endoscopy on 09/28/2013 which revealed a normal esophagus without masses or strictures, esophagitis, eosinophilic esophagitis. There was a few dispersed small erosions found in the prepyloric region of the stomach.      Glaucoma    Glaucoma, both eyes    Hepatitis B infection pt unsure   resolved   History of colonic polyps 05/17/2022   IMO SNOMED Dx Update Oct 2024     History of kidney stones    right stone   History of transient ischemic attack (TIA) 2017   per pt no residuals   History of vitamin D deficiency 09/06/2017   HTN (hypertension)    followed by pcp   Hyperlipidemia    Hyperparathyroidism (HCC) 03/08/2013   Appears to be diagnosed around 02/2013:  PTH 109.9  Vit d 25-OH 02/2013: 19  Dexa 02/2013: Patient's diagnostic category is NORMAL by WHO Criteria. FRACTURE RISK: NOT INCREASED FRAX: Not calculated due to a T score at or above -1.0.    Serum  calcium initially 11.8  Ionized calcium elevated 1.45        Liver hemangioma 04/04/2014   Lumbar spondylosis 06/05/2021   Osteoarthritis 12/11/2014   PAD (peripheral artery disease) (HCC)    Paronychia of great toe, left 06/21/2019   Poor dental hygiene    Stroke (HCC)    Tobacco abuse    .5 PPD smoker   Uncontrolled type II diabetes mellitus 06/12/2007   Qualifier: Diagnosis of  By: Drinkard MSN, FNP-C, Fannie Knee     Past Surgical History:  Procedure Laterality Date   ABDOMINAL AORTOGRAM W/LOWER EXTREMITY N/A 09/14/2019   Procedure: ABDOMINAL AORTOGRAM W/LOWER EXTREMITY;  Surgeon: Sherren Kerns, MD;  Location: [facility] INVASIVE CV LAB;  Service: Cardiovascular;  Laterality: N/A;   ABDOMINAL HYSTERECTOMY  1980s   unsure if ovaries were taken   BYPASS GRAFT Left 10/22/2019    Left femoral to above-knee popliteal bypass using reversed ipsilateral greater saphenous vein   colonoscopy     FEMORAL-POPLITEAL BYPASS GRAFT Left 10/22/2019   Procedure: LEFT FEMORAL-ABOVE KNEE POPLITEAL  BYPASS GRAFT with REVERSED GREATER SAPHENOUS VEIN;  Surgeon: Sherren Kerns, MD;  Location: [facility] OR;  Service: Vascular;  Laterality: Left;   GLAUCOMA SURGERY Right 2017   IR URETERAL STENT RIGHT NEW ACCESS W/O SEP NEPHROSTOMY CATH  10/03/2018   LEFT HEART CATHETERIZATION WITH CORONARY ANGIOGRAM N/A 10/09/2014   Procedure: LEFT HEART CATHETERIZATION WITH CORONARY ANGIOGRAM;  Surgeon: Lennette Bihari, MD;  Location: [facility] CATH LAB;  Service: Cardiovascular;  Laterality: N/A;   NEPHROLITHOTOMY Right 10/03/2018   Procedure: RIGHT NEPHROLITHOTOMY PERCUTANEOUS FIRST STAGE;  Surgeon: Bjorn Pippin, MD;  Location: [facility] ORS;  Service: Urology;  Laterality: Right;   PERIPHERAL VASCULAR INTERVENTION Left 09/14/2019   Procedure: PERIPHERAL VASCULAR INTERVENTION;  Surgeon: Sherren Kerns, MD;  Location: [facility] INVASIVE CV LAB;  Service: Cardiovascular;  Laterality: Left;   left external iliac stent    TEE WITHOUT CARDIOVERSION N/A 08/17/2016    Procedure: TRANSESOPHAGEAL ECHOCARDIOGRAM (TEE);  Surgeon: Quintella Reichert, MD;  Location: [facility] ENDOSCOPY;  Service: Cardiovascular;  Laterality: N/A;   Patient Active Problem List   Diagnosis Date Noted   Rotator cuff impingement syndrome 11/09/2023   Coronary artery disease 05/03/2023   Delayed gastric emptying 12/31/2020   Sensorineural hearing loss (SNHL), bilateral 10/23/2020   Polypharmacy 09/09/2020   Peripheral arterial disease (HCC) 10/22/2019   Type 2 diabetes mellitus (HCC) 10/04/2019   Reactive airway disease 06/20/2018   History of CVA (cerebrovascular accident) 07/23/2016   Trochanteric bursitis of right hip 07/04/2015   Calcific tendinitis of right shoulder 02/26/2012   TOBACCO ABUSE 06/12/2007   Depression 06/12/2007   Essential hypertension 06/12/2007    PCP: Mercie Eon, MD  REFERRING PROVIDER: Miguel Aschoff, MD  THERAPY DIAG:  Pain in right hip - Plan: PT plan of care cert/re-cert  Muscle weakness - Plan: PT plan of care cert/re-cert  Other abnormalities of gait and mobility - Plan: PT plan of care cert/re-cert  REFERRING DIAG: Trochanteric bursitis of right hip [M70.61]   Rationale for Evaluation and Treatment:  Rehabilitation  SUBJECTIVE:  PERTINENT PAST HISTORY:  Depression, hx of CVA (2017), DM TII, PAD, Bil shoulder pain, reactive airway disease, hearing loss        PRECAUTIONS: None  WEIGHT BEARING RESTRICTIONS No  FALLS:  Has patient fallen in last 6 months? Yes, Number of falls: walking outside and thinks she may have tripped on something with no major injury.  MOI/History of condition:  Onset date: "many months"  SUBJECTIVE STATEMENT  Susan Holmes is a 68 y.o. female who presents to clinic with chief complaint of R hip pain which started many months ago.  She denies any acute injury or trauma.  The pain keeps her up at night especially if she is laying on her R side.  She does get pain when walking.  She feels that her R leg  is weak.  She endorses some n/t in the R LE.  She does have low back pain.  Sh denies groin pain.  She did have complete relief following a steroid injection for about 3 days   Red flags:  denies   Pain:  Are you having pain? Yes Pain location: R lateral hip NPRS scale:  6/10 to 10/10 Aggravating factors: pressure, standing, walking Relieving factors: rest Pain description: aching Stage: Chronic 24 hour pattern: worse at night (pressure)   Occupation: NA  Assistive Device: does not use  Hand Dominance: na  Patient Goals/Specific Activities: reduce pain   OBJECTIVE:   DIAGNOSTIC FINDINGS:  None recent  GENERAL OBSERVATION/GAIT: Antalgic gait with reduced time in stance on R  SENSATION: Light touch: Appears intact  PALPATION: Concordant pain with palpation of GT  MUSCLE LENGTH: Hamstrings: Right significant restriction; Left subtle restriction   LE MMT:  MMT Right (Eval) Left (Eval)  Hip flexion (L2, L3) 3+* 4  Knee extension (L3) 3+* 3+  Knee flexion 3+ 3+  Hip abduction 2+*   Hip extension    Hip external rotation    Hip internal rotation    Hip adduction    Ankle dorsiflexion (L4)    Ankle plantarflexion (S1)    Ankle inversion    Ankle eversion    Great Toe ext (L5)    Grossly     (Blank rows = not tested, score listed is out of 5 possible points.  N = WNL, D = diminished, C = clear for gross weakness with myotome testing, * = concordant pain with testing)  LE ROM:  ROM Right (Eval) Left (Eval)  Hip flexion ~90 lateral hp pain 110  Hip extension    Hip abduction    Hip adduction    Hip internal rotation WNL   Hip external rotation WNL*   Knee extension    Knee flexion    Ankle dorsiflexion    Ankle plantarflexion    Ankle inversion    Ankle eversion     (Blank rows = not tested, N = WNL, * = concordant pain with testing)  Functional Tests  Eval    30'' STS: 8x  UE used? Y significant UE support                                                           SPECIAL TESTS:  Scour, faber (+ for lateral hip pain), fadir (-)   PATIENT SURVEYS:  LEFS: 47/80   TODAY'S TREATMENT: Therapeutic Exercise: Creating, reviewing, and completing below HEP   PATIENT EDUCATION ([place]/HM):  POC, diagnosis, prognosis, HEP, and outcome measures.  Pt educated via explanation, demonstration, and handout (HEP).  Pt confirms understanding verbally.   HOME EXERCISE PROGRAM: Access Code: [email or web address] URL: [email or web address] Date: 03/27/2024 Prepared by: Alphonzo Severance  Exercises - Seated Hamstring Stretch  - 1 x daily - 7 x weekly - 1 sets - 3 reps - 45 hold - Supine Bridge  - 1 x daily - 7 x weekly - 3 sets - 10 reps - 2-3 seconds hold - Supine Hip Adduction Isometric with Ball  - 1 x daily - 7 x weekly - 2 sets - 10 reps - 10'' hold  Treatment priorities   Eval        R HS        Hip ext and lateral hip strengthening        Balance and gait as needed                          ASSESSMENT:  CLINICAL IMPRESSION: Janeliz is a 68 y.o. female who presents to clinic with signs and sxs consistent with R hip pain.  Consistent with GTPS.  She does have hip pain with FABER, hip flexion, and scour but this is isolated to the R lateral hip with no groin/deep/posterior pain as would be expected with interarticular pathology.  She has reproduction with palpation of GT and the most pain when she puts direct pressure  on there R GT at night with increases likelihood of GTPS.  She is scheduled for X-ray.  Caressa will benefit from skilled PT to address relevant deficits and improve safety and comfort with daily tasks such as sleeping, walking, and standing.    OBJECTIVE IMPAIRMENTS: Pain, R hip ROM, gait, balance, bil hip and LE strength  ACTIVITY LIMITATIONS: standing, sleeping, walking, tranfers  PERSONAL FACTORS: See medical history and pertinent history   REHAB POTENTIAL: Good  CLINICAL DECISION MAKING:  Evolving/moderate complexity  EVALUATION COMPLEXITY: Moderate   GOALS:   SHORT TERM GOALS: Target date: 04/24/2024   Yuridiana will be >75% HEP compliant to improve carryover between sessions and facilitate independent management of condition  Evaluation: ongoing Goal status: INITIAL   LONG TERM GOALS: Target date: 05/22/2024   Maziah will self report >/= 50% decrease in pain from evaluation to improve function in daily tasks  Evaluation/Baseline: 10/10 max pain Goal status: INITIAL   2.  Tulsi will show significant improvement in her ability to sleep on R side, not limited by pain  Evaluation/Baseline: limited by R hip pain Goal status: INITIAL   3.  Yarden will improve 30'' STS (MCID 2) to >/= 8x w/ min UE support to show improved LE strength and improved transfers   Evaluation/Baseline: 8x partial rise with max UE support Goal status: INITIAL   4.  Eh will improve the following MMTs to >/= 3+/5 to show improvement in strength:  R hip abd   Evaluation/Baseline: see chart in note Goal status: INITIAL   5.  Marygrace will show roughly symmetrical HS length  Evaluation/Baseline: R HS limited > L Goal status: INITIAL   PLAN: PT FREQUENCY: 1-2x/week  PT DURATION: 8 weeks  PLANNED INTERVENTIONS:  97164- PT Re-evaluation, 97110-Therapeutic exercises, 97530- Therapeutic activity, [id number]- Neuromuscular re-education, 97535- Self Care, 47829- Manual therapy, [id number]- Gait training, [phone]- Aquatic Therapy, [phone]- Electrical stimulation (manual), [id number]- Vasopneumatic device, [id number]- Traction (mechanical), [id number]- Ionotophoresis 4mg /ml Dexamethasone, Taping, Dry Needling, Joint manipulation, and Spinal manipulation.   Alphonzo Severance PT, DPT 03/27/2024, 11:28 AM

## 2024-04-01 ENCOUNTER — Other Ambulatory Visit: Payer: Self-pay | Admitting: Physician Assistant

## 2024-04-01 DIAGNOSIS — I10 Essential (primary) hypertension: Secondary | ICD-10-CM

## 2024-04-03 MED ORDER — ROSUVASTATIN CALCIUM 20 MG PO TABS: 20.0000 mg | ORAL_TABLET | Freq: Every day | ORAL | 0 refills | Status: DC

## 2024-04-03 NOTE — Addendum Note (Signed)
 Addended by: Adriana Simas, Margaurite Salido L on: 04/03/2024 08:23 AM   Modules accepted: Orders

## 2024-04-06 ENCOUNTER — Ambulatory Visit: Admitting: Physical Therapy

## 2024-04-06 ENCOUNTER — Encounter: Payer: Self-pay | Admitting: Physical Therapy

## 2024-04-06 DIAGNOSIS — R2689 Other abnormalities of gait and mobility: Secondary | ICD-10-CM | POA: Diagnosis not present

## 2024-04-06 DIAGNOSIS — M25551 Pain in right hip: Secondary | ICD-10-CM

## 2024-04-06 DIAGNOSIS — M6281 Muscle weakness (generalized): Secondary | ICD-10-CM

## 2024-04-06 DIAGNOSIS — M7061 Trochanteric bursitis, right hip: Secondary | ICD-10-CM | POA: Diagnosis not present

## 2024-04-06 NOTE — Therapy (Addendum)
 PHYSICAL THERAPY UNPLANNED DISCHARGE SUMMARY   Visits from Start of Care: 2  Current functional level related to goals / functional outcomes: Current status unknown   Remaining deficits: Current status unknown   Education / Equipment: Pt has not returned since visit listed below  Patient goals were not assessed. Patient is being discharged due to not returning since the last visit.  (the note below was addended to include the above D/C summary on 11/27/24)  OUTPATIENT PHYSICAL THERAPY DAILY NOTE  Patient Name: JOLANTA CABEZA MRN: [id number] DOB:[birth year], 68 y.o., female Today's Date: 04/06/2024   PT End of Session - 04/06/24 0930     Visit Number 2    Number of Visits --   1-2x/week   Date for PT Re-Evaluation 05/22/24    Authorization Type UHC MCR/MCD - LEFS    Progress Note Due on Visit 10    PT Start Time 0930    PT Stop Time 1011    PT Time Calculation (min) 41 min             Past Medical History:  Diagnosis Date   Anxiety    Arthritis    Callus of foot 09/19/2018   Chest pain 09/26/2018   Claudication (HCC) 09/14/2019   Congenital blindness    Left  eye   CONGENITAL BLINDNESS, LEFT EYE 06/12/2007   Qualifier: Diagnosis of   By: Drinkard MSN, FNP-C, Sue         Coronary artery disease    per cardiac cath 2015-- mild disease involving LAD and branches ,  LCFx and branches, and mild disease pRCA   Cough 07/08/2016   Depression    Diabetes mellitus, type II (HCC)    followed by pcp   Erosive gastropathy 12/31/2020   She also had an upper GI endoscopy on 09/28/2013 which revealed a normal esophagus without masses or strictures, esophagitis, eosinophilic esophagitis. There was a few dispersed small erosions found in the prepyloric region of the stomach.      Glaucoma    Glaucoma, both eyes    Hepatitis B infection pt unsure   resolved   History of colonic polyps 05/17/2022   IMO SNOMED Dx Update Oct 2024     History of kidney stones    right  stone   History of transient ischemic attack (TIA) 2017   per pt no residuals   History of vitamin D  deficiency 09/06/2017   HTN (hypertension)    followed by pcp   Hyperlipidemia    Hyperparathyroidism (HCC) 03/08/2013   Appears to be diagnosed around 02/2013:  PTH 109.9  Vit d 25-OH 02/2013: 19  Dexa 02/2013: Patient's diagnostic category is NORMAL by WHO Criteria. FRACTURE RISK: NOT INCREASED FRAX: Not calculated due to a T score at or above -1.0.    Serum calcium  initially 11.8  Ionized calcium  elevated 1.45        Liver hemangioma 04/04/2014   Lumbar spondylosis 06/05/2021   Osteoarthritis 12/11/2014   PAD (peripheral artery disease) (HCC)    Paronychia of great toe, left 06/21/2019   Poor dental hygiene    Stroke (HCC)    Tobacco abuse    .5 PPD smoker   Uncontrolled type II diabetes mellitus 06/12/2007   Qualifier: Diagnosis of  By: Drinkard MSN, FNP-C, Beverley     Past Surgical History:  Procedure Laterality Date   ABDOMINAL AORTOGRAM W/LOWER EXTREMITY N/A 09/14/2019   Procedure: ABDOMINAL AORTOGRAM W/LOWER EXTREMITY;  Surgeon: Harvey Carlin BRAVO, MD;  Location: [facility] INVASIVE CV LAB;  Service: Cardiovascular;  Laterality: N/A;   ABDOMINAL HYSTERECTOMY  1980s   unsure if ovaries were taken   BYPASS GRAFT Left 10/22/2019    Left femoral to above-knee popliteal bypass using reversed ipsilateral greater saphenous vein   colonoscopy     FEMORAL-POPLITEAL BYPASS GRAFT Left 10/22/2019   Procedure: LEFT FEMORAL-ABOVE KNEE POPLITEAL  BYPASS GRAFT with REVERSED GREATER SAPHENOUS VEIN;  Surgeon: Harvey Carlin BRAVO, MD;  Location: [facility] OR;  Service: Vascular;  Laterality: Left;   GLAUCOMA SURGERY Right 2017   IR URETERAL STENT RIGHT NEW ACCESS W/O SEP NEPHROSTOMY CATH  10/03/2018   LEFT HEART CATHETERIZATION WITH CORONARY ANGIOGRAM N/A 10/09/2014   Procedure: LEFT HEART CATHETERIZATION WITH CORONARY ANGIOGRAM;  Surgeon: Debby DELENA Sor, MD;  Location: [facility] CATH LAB;  Service: Cardiovascular;   Laterality: N/A;   NEPHROLITHOTOMY Right 10/03/2018   Procedure: RIGHT NEPHROLITHOTOMY PERCUTANEOUS FIRST STAGE;  Surgeon: Watt Rush, MD;  Location: [facility] ORS;  Service: Urology;  Laterality: Right;   PERIPHERAL VASCULAR INTERVENTION Left 09/14/2019   Procedure: PERIPHERAL VASCULAR INTERVENTION;  Surgeon: Harvey Carlin BRAVO, MD;  Location: [facility] INVASIVE CV LAB;  Service: Cardiovascular;  Laterality: Left;   left external iliac stent    TEE WITHOUT CARDIOVERSION N/A 08/17/2016   Procedure: TRANSESOPHAGEAL ECHOCARDIOGRAM (TEE);  Surgeon: Wilbert JONELLE Bihari, MD;  Location: [facility] ENDOSCOPY;  Service: Cardiovascular;  Laterality: N/A;   Patient Active Problem List   Diagnosis Date Noted   Rotator cuff impingement syndrome 11/09/2023   Coronary artery disease 05/03/2023   Delayed gastric emptying 12/31/2020   Sensorineural hearing loss (SNHL), bilateral 10/23/2020   Polypharmacy 09/09/2020   Peripheral arterial disease (HCC) 10/22/2019   Type 2 diabetes mellitus (HCC) 10/04/2019   Reactive airway disease 06/20/2018   History of CVA (cerebrovascular accident) 07/23/2016   Trochanteric bursitis of right hip 07/04/2015   Calcific tendinitis of right shoulder 02/26/2012   TOBACCO ABUSE 06/12/2007   Depression 06/12/2007   Essential hypertension 06/12/2007    PCP: Lovie Clarity, MD  REFERRING PROVIDER: Trudy Clarity Dragon, MD  THERAPY DIAG:  Pain in right hip  Muscle weakness  Other abnormalities of gait and mobility  REFERRING DIAG: Trochanteric bursitis of right hip [M70.61]   Rationale for Evaluation and Treatment:  Rehabilitation  SUBJECTIVE:  PERTINENT PAST HISTORY:  Depression, hx of CVA (2017), DM TII, PAD, Bil shoulder pain, reactive airway disease, hearing loss        PRECAUTIONS: None  WEIGHT BEARING RESTRICTIONS No  FALLS:  Has patient fallen in last 6 months? Yes, Number of falls: walking outside and thinks she may have tripped on something with no major injury.  MOI/History  of condition:  Onset date: many months  SUBJECTIVE STATEMENT  04/06/2024: Pt reports continued hip pain.  4/11:  CANDICE TOBEY is a 68 y.o. female who presents to clinic with chief complaint of R hip pain which started many months ago.  She denies any acute injury or trauma.  The pain keeps her up at night especially if she is laying on her R side.  She does get pain when walking.  She feels that her R leg is weak.  She endorses some n/t in the R LE.  She does have low back pain.  Sh denies groin pain.  She did have complete relief following a steroid injection for about 3 days   Red flags:  denies   Pain:  Are you having pain? Yes Pain location: R lateral hip NPRS  scale:  6/10 to 10/10 Aggravating factors: pressure, standing, walking Relieving factors: rest Pain description: aching Stage: Chronic 24 hour pattern: worse at night (pressure)   Occupation: NA  Assistive Device: does not use  Hand Dominance: na  Patient Goals/Specific Activities: reduce pain   OBJECTIVE:   DIAGNOSTIC FINDINGS:  None recent  GENERAL OBSERVATION/GAIT: Antalgic gait with reduced time in stance on R  SENSATION: Light touch: Appears intact  PALPATION: Concordant pain with palpation of GT  MUSCLE LENGTH: Hamstrings: Right significant restriction; Left subtle restriction   LE MMT:  MMT Right (Eval) Left (Eval)  Hip flexion (L2, L3) 3+* 4  Knee extension (L3) 3+* 3+  Knee flexion 3+ 3+  Hip abduction 2+*   Hip extension    Hip external rotation    Hip internal rotation    Hip adduction    Ankle dorsiflexion (L4)    Ankle plantarflexion (S1)    Ankle inversion    Ankle eversion    Great Toe ext (L5)    Grossly     (Blank rows = not tested, score listed is out of 5 possible points.  N = WNL, D = diminished, C = clear for gross weakness with myotome testing, * = concordant pain with testing)  LE ROM:  ROM Right (Eval) Left (Eval)  Hip flexion ~90 lateral hp pain 110   Hip extension    Hip abduction    Hip adduction    Hip internal rotation WNL   Hip external rotation WNL*   Knee extension    Knee flexion    Ankle dorsiflexion    Ankle plantarflexion    Ankle inversion    Ankle eversion     (Blank rows = not tested, N = WNL, * = concordant pain with testing)  Functional Tests  Eval    30'' STS: 8x  UE used? Y significant UE support                                                          SPECIAL TESTS:  Scour, faber (+ for lateral hip pain), fadir (-)   PATIENT SURVEYS:  LEFS: 47/80   TODAY'S TREATMENT:  [facility] Adult PT Treatment  04/06/2024:  Therapeutic Exercise:  nu-step L5 19m while taking subjective and planning session with patient Manual HS stretch - not tolerated Manual ITB stretch - 30'' x3 - R R S/L clam - 3x10 R hip IR with ball between knees - 3x10 Bridge - 5'' hold - partial ROM - 2x10 Knee ext machine - 2x10 10#, S/L 2x10 5# HS curl - Bil - 2x10    HOME EXERCISE PROGRAM: Access Code: [email or web address] URL: [email or web address] Date: 04/06/2024 Prepared by: Helene Gasmen  Exercises - Seated Hamstring Stretch  - 1 x daily - 7 x weekly - 1 sets - 3 reps - 45 hold - Clamshell  - 1 x daily - 7 x weekly - 3 sets - 10 reps - Sidelying Reverse Clamshell  - 1 x daily - 7 x weekly - 3 sets - 10 reps - Supine Bridge  - 1 x daily - 7 x weekly - 3 sets - 10 reps - 2-3 seconds hold  Treatment priorities   Eval        R HS  Hip ext and lateral hip strengthening        Balance and gait as needed                          ASSESSMENT:  CLINICAL IMPRESSION:  04/06/2024:  Ronnald tolerated session fair with no adverse reaction.  We were able to add in a few new exercises which focused on hip IR/ER.  She is still quite limited by pain but does report reduced pain following therapy.  EVAL: Donata is a 68 y.o. female who presents to clinic with signs and sxs consistent with R hip pain.   Consistent with GTPS.  She does have hip pain with FABER, hip flexion, and scour but this is isolated to the R lateral hip with no groin/deep/posterior pain as would be expected with interarticular pathology.  She has reproduction with palpation of GT and the most pain when she puts direct pressure on there R GT at night with increases likelihood of GTPS.  She is scheduled for X-ray.  Zaray will benefit from skilled PT to address relevant deficits and improve safety and comfort with daily tasks such as sleeping, walking, and standing.    OBJECTIVE IMPAIRMENTS: Pain, R hip ROM, gait, balance, bil hip and LE strength  ACTIVITY LIMITATIONS: standing, sleeping, walking, tranfers  PERSONAL FACTORS: See medical history and pertinent history   REHAB POTENTIAL: Good  CLINICAL DECISION MAKING: Evolving/moderate complexity  EVALUATION COMPLEXITY: Moderate   GOALS:   SHORT TERM GOALS: Target date: 04/24/2024   Addelyn will be >75% HEP compliant to improve carryover between sessions and facilitate independent management of condition  Evaluation: ongoing Goal status: INITIAL   LONG TERM GOALS: Target date: 05/22/2024   Axel will self report >/= 50% decrease in pain from evaluation to improve function in daily tasks  Evaluation/Baseline: 10/10 max pain Goal status: INITIAL   2.  Keyshawna will show significant improvement in her ability to sleep on R side, not limited by pain  Evaluation/Baseline: limited by R hip pain Goal status: INITIAL   3.  Harley will improve 30'' STS (MCID 2) to >/= 8x w/ min UE support to show improved LE strength and improved transfers   Evaluation/Baseline: 8x partial rise with max UE support Goal status: INITIAL   4.  Javonda will improve the following MMTs to >/= 3+/5 to show improvement in strength:  R hip abd   Evaluation/Baseline: see chart in note Goal status: INITIAL   5.  Allyson will show roughly symmetrical HS length  Evaluation/Baseline: R HS  limited > L Goal status: INITIAL   PLAN: PT FREQUENCY: 1-2x/week  PT DURATION: 8 weeks  PLANNED INTERVENTIONS:  97164- PT Re-evaluation, 97110-Therapeutic exercises, 97530- Therapeutic activity, [id number]- Neuromuscular re-education, 97535- Self Care, [id number]- Manual therapy, [id number]- Gait training, [id number]- Aquatic Therapy, [phone]- Electrical stimulation (manual), [id number]- Vasopneumatic device, [id number]- Traction (mechanical), [id number]- Ionotophoresis 4mg /ml Dexamethasone , Taping, Dry Needling, Joint manipulation, and Spinal manipulation.   Jennice Renegar PT, DPT 04/06/2024, 10:32 AM

## 2024-04-11 ENCOUNTER — Ambulatory Visit: Admitting: Physical Therapy

## 2024-04-16 ENCOUNTER — Telehealth: Payer: Self-pay | Admitting: Internal Medicine

## 2024-04-16 NOTE — Telephone Encounter (Signed)
 Spoke with the patient. She is now aware she will need a F/u appt.    MRN: [id number]  Date: 04/20/2024 Status: Sch  Time: 9:45 AM Length: 30  Visit Type: OPEN ESTABLISHED [726] Copay: $0.00  Provider: Malen Scudder, DO       Copied from CRM [id number]. Topic: Referral - Request for Referral >> Apr 16, 2024  8:34 AM Lenon Radar A wrote: Did the patient discuss referral with their provider in the last year? Yes (If No - schedule appointment) (If Yes - send message)  Appointment offered? No  Type of order/referral and detailed reason for visit: Hip and Shoulder Referral/Pain Management  Preference of office, provider, location: No Preference  If referral order, have you been seen by this specialty before? No (If Yes, this issue or another issue? When? Where?  Can we respond through MyChart? No

## 2024-04-18 ENCOUNTER — Encounter: Admitting: Physical Therapy

## 2024-04-18 DIAGNOSIS — M11262 Other chondrocalcinosis, left knee: Secondary | ICD-10-CM | POA: Diagnosis not present

## 2024-04-18 DIAGNOSIS — M542 Cervicalgia: Secondary | ICD-10-CM | POA: Diagnosis not present

## 2024-04-18 DIAGNOSIS — M546 Pain in thoracic spine: Secondary | ICD-10-CM | POA: Diagnosis not present

## 2024-04-18 DIAGNOSIS — M62830 Muscle spasm of back: Secondary | ICD-10-CM | POA: Diagnosis not present

## 2024-04-18 DIAGNOSIS — M545 Low back pain, unspecified: Secondary | ICD-10-CM | POA: Diagnosis not present

## 2024-04-18 DIAGNOSIS — G8929 Other chronic pain: Secondary | ICD-10-CM | POA: Diagnosis not present

## 2024-04-18 DIAGNOSIS — M25562 Pain in left knee: Secondary | ICD-10-CM | POA: Diagnosis not present

## 2024-04-18 DIAGNOSIS — G894 Chronic pain syndrome: Secondary | ICD-10-CM | POA: Diagnosis not present

## 2024-04-18 DIAGNOSIS — M25511 Pain in right shoulder: Secondary | ICD-10-CM | POA: Diagnosis not present

## 2024-04-19 ENCOUNTER — Ambulatory Visit
Admission: RE | Admit: 2024-04-19 | Discharge: 2024-04-19 | Disposition: A | Discharge status: Home / Self Care | Source: Ambulatory Visit | Attending: Internal Medicine | Admitting: Internal Medicine

## 2024-04-19 DIAGNOSIS — Z1231 Encounter for screening mammogram for malignant neoplasm of breast: Secondary | ICD-10-CM

## 2024-04-20 ENCOUNTER — Ambulatory Visit: Admitting: Internal Medicine

## 2024-04-20 ENCOUNTER — Ambulatory Visit (HOSPITAL_COMMUNITY)
Admission: RE | Admit: 2024-04-20 | Discharge: 2024-04-20 | Disposition: A | Discharge status: Home / Self Care | Source: Ambulatory Visit | Attending: Internal Medicine | Admitting: Internal Medicine

## 2024-04-20 VITALS — BP 136/85 | HR 98 | Temp 97.8°F | Ht 67.0 in | Wt 172.8 lb

## 2024-04-20 DIAGNOSIS — M7061 Trochanteric bursitis, right hip: Secondary | ICD-10-CM

## 2024-04-20 DIAGNOSIS — Z794 Long term (current) use of insulin: Secondary | ICD-10-CM

## 2024-04-20 DIAGNOSIS — I10 Essential (primary) hypertension: Secondary | ICD-10-CM

## 2024-04-20 DIAGNOSIS — M160 Bilateral primary osteoarthritis of hip: Secondary | ICD-10-CM | POA: Diagnosis not present

## 2024-04-20 DIAGNOSIS — M47816 Spondylosis without myelopathy or radiculopathy, lumbar region: Secondary | ICD-10-CM | POA: Diagnosis not present

## 2024-04-20 DIAGNOSIS — M25551 Pain in right hip: Secondary | ICD-10-CM | POA: Diagnosis not present

## 2024-04-20 MED ORDER — DICLOFENAC SODIUM ER 100 MG PO TB24: 100.0000 mg | ORAL_TABLET | Freq: Every day | ORAL | 2 refills | Status: DC

## 2024-04-20 NOTE — Patient Instructions (Signed)
 It was a pleasure to care for you today!  I have sent in a anti-inflammatory medicine called voltaren . You can take one tablet daily. I want you to STOP taking celecoxib  (celebrex ).  Please contact your pain clinic about your increased pain control needs.  I have placed a referral for orthopedic surgery so you can re-establish with their care for steroid injections.  Please follow up in 3-4 weeks so we can see how your pain is responding to this medicine change and ensure you have been able to get in to the orthopedic surgeon's office.  My best, Dr. Rozelle Corning

## 2024-04-20 NOTE — Progress Notes (Signed)
 CC: pain f/u  HPI:  Susan Holmes is a 68 y.o. female with past medical history as detailed below who presents for pain follow up. Please see problem based charting for detailed assessment and plan.  Past Medical History:  Diagnosis Date   Anxiety    Arthritis    Callus of foot 09/19/2018   Chest pain 09/26/2018   Claudication (HCC) 09/14/2019   Congenital blindness    Left  eye   CONGENITAL BLINDNESS, LEFT EYE 06/12/2007   Qualifier: Diagnosis of   By: Drinkard MSN, FNP-C, Sue         Coronary artery disease    per cardiac cath 2015-- mild disease involving LAD and branches ,  LCFx and branches, and mild disease pRCA   Cough 07/08/2016   Depression    Diabetes mellitus, type II (HCC)    followed by pcp   Erosive gastropathy 12/31/2020   She also had an upper GI endoscopy on 09/28/2013 which revealed a normal esophagus without masses or strictures, esophagitis, eosinophilic esophagitis. There was a few dispersed small erosions found in the prepyloric region of the stomach.      Glaucoma    Glaucoma, both eyes    Hepatitis B infection pt unsure   resolved   History of colonic polyps 05/17/2022   IMO SNOMED Dx Update Oct 2024     History of kidney stones    right stone   History of transient ischemic attack (TIA) 2017   per pt no residuals   History of vitamin D  deficiency 09/06/2017   HTN (hypertension)    followed by pcp   Hyperlipidemia    Hyperparathyroidism (HCC) 03/08/2013   Appears to be diagnosed around 02/2013:  PTH 109.9  Vit d 25-OH 02/2013: 19  Dexa 02/2013: Patient's diagnostic category is NORMAL by WHO Criteria. FRACTURE RISK: NOT INCREASED FRAX: Not calculated due to a T score at or above -1.0.    Serum calcium  initially 11.8  Ionized calcium  elevated 1.45        Liver hemangioma 04/04/2014   Lumbar spondylosis 06/05/2021   Osteoarthritis 12/11/2014   PAD (peripheral artery disease) (HCC)    Paronychia of great toe, left 06/21/2019   Poor dental  hygiene    Stroke (HCC)    Tobacco abuse    .5 PPD smoker   Uncontrolled type II diabetes mellitus 06/12/2007   Qualifier: Diagnosis of  By: Drinkard MSN, FNP-C, Bobbye Burrow     Review of Systems:  Negative unless otherwise stated.  Physical Exam:  Vitals:   04/20/24 0948  BP: 136/85  Pulse: 98  Temp: 97.8 F (36.6 C)  TempSrc: Oral  SpO2: 100%  Weight: 172 lb 12.8 oz (78.4 kg)  Height: 5\' 7"  (1.702 m)   Physical Exam Constitutional:      General: She is not in acute distress.    Appearance: She is not ill-appearing.  Pulmonary:     Effort: Pulmonary effort is normal.  Musculoskeletal:     Right shoulder: No swelling, deformity, effusion, tenderness, bony tenderness or crepitus. Normal range of motion. Normal strength.     Left shoulder: No swelling, deformity, effusion, tenderness, bony tenderness or crepitus. Normal range of motion. Normal strength.     Right hip: No tenderness. Normal strength.     Left hip: No tenderness. Normal strength.  Neurological:     Mental Status: She is alert.    Assessment & Plan:   See Encounters Tab for problem based charting.  Trochanteric bursitis of right hip Patient presents today for follow up regarding her hip and shoulder pain and to discuss pain management. She was last seen 03/12 and diagnosed with trochanteric bursitis of the R hip and referred to physical therapy, who she has seen one time and not been back as she says she thinks it made her symptoms worse. Note a x-ray of the R hip was ordered but has not yet been done. She was advised to contact her orthopedic surgeon's office to discuss joint injection and her pain regimen as she is on an extensive regimen already including baclofen, buprenorphine, celecoxib , voltaren  gel, lidocaine  patches, trazodone . She has not followed up with their office and requesting a referral to their office at this time. She sees [facility] for pain management and though her fill history is not  consistent with the medications she states yes to taking, I do see that they are supplying her oxycodone  and buprenorphine. Today her concern is that she only has pain at night, notably in her R hip when she lays on her R side and then in her L shoulder when she lays on the L side. She has not had new weakness, no recent falls or traumas. She does not feel the celebrex  is providing much pain relief. No abnormalities appreciated on exam, no pain reported on examination of ROM and not reproducible on my exam. She is not having any incontinence or weakness. Plan:Referral to orthopedic surgery placed. Will STOP celebrex  and START voltaren  100 mg daily. Patient advised that she should discuss ongoing pain management with her pain clinic, [facility]. She states she will go upstairs to radiology on the way out to complete the x-rays ordered back in March.  Patient discussed with Dr. Broadus Canes

## 2024-04-20 NOTE — Assessment & Plan Note (Signed)
 Patient presents today for follow up regarding her hip and shoulder pain and to discuss pain management. She was last seen 03/12 and diagnosed with trochanteric bursitis of the R hip and referred to physical therapy, who she has seen one time and not been back as she says she thinks it made her symptoms worse. Note a x-ray of the R hip was ordered but has not yet been done. She was advised to contact her orthopedic surgeon's office to discuss joint injection and her pain regimen as she is on an extensive regimen already including baclofen, buprenorphine, celecoxib , voltaren  gel, lidocaine  patches, trazodone . She has not followed up with their office and requesting a referral to their office at this time. She sees [facility] for pain management and though her fill history is not consistent with the medications she states yes to taking, I do see that they are supplying her oxycodone  and buprenorphine. Today her concern is that she only has pain at night, notably in her R hip when she lays on her R side and then in her L shoulder when she lays on the L side. She has not had new weakness, no recent falls or traumas. She does not feel the celebrex  is providing much pain relief. No abnormalities appreciated on exam, no pain reported on examination of ROM and not reproducible on my exam. She is not having any incontinence or weakness. Plan:Referral to orthopedic surgery placed. Will STOP celebrex  and START voltaren  100 mg daily. Patient advised that she should discuss ongoing pain management with her pain clinic, [facility]. She states she will go upstairs to radiology on the way out to complete the x-rays ordered back in March.

## 2024-04-23 ENCOUNTER — Other Ambulatory Visit: Payer: Self-pay | Admitting: Physician Assistant

## 2024-04-23 DIAGNOSIS — I10 Essential (primary) hypertension: Secondary | ICD-10-CM

## 2024-04-24 MED ORDER — LANTUS SOLOSTAR 100 UNIT/ML ~~LOC~~ SOPN: 44.0000 [IU] | PEN_INJECTOR | Freq: Every day | SUBCUTANEOUS | 3 refills | Status: DC

## 2024-04-24 MED ORDER — IRBESARTAN 300 MG PO TABS: 300.0000 mg | ORAL_TABLET | Freq: Every day | ORAL | 1 refills | Status: DC

## 2024-04-24 MED ORDER — INSULIN LISPRO (1 UNIT DIAL) 100 UNIT/ML (KWIKPEN): 6.0000 [IU] | PEN_INJECTOR | Freq: Two times a day (BID) | SUBCUTANEOUS | 1 refills | Status: DC

## 2024-04-24 MED ORDER — AMLODIPINE BESYLATE 10 MG PO TABS: 10.0000 mg | ORAL_TABLET | Freq: Every day | ORAL | 3 refills | Status: DC

## 2024-04-24 NOTE — Addendum Note (Signed)
 Addended by: Maple Seltzer on: 04/24/2024 10:19 AM   Modules accepted: Orders

## 2024-04-25 ENCOUNTER — Encounter: Admitting: Physical Therapy

## 2024-04-25 NOTE — Progress Notes (Signed)
 Internal Medicine Clinic Attending  Case discussed with the resident at the time of the visit.  We reviewed the resident's history and exam and pertinent patient test results.  I agree with the assessment, diagnosis, and plan of care documented in the resident's note.

## 2024-05-15 DIAGNOSIS — E1140 Type 2 diabetes mellitus with diabetic neuropathy, unspecified: Secondary | ICD-10-CM | POA: Diagnosis not present

## 2024-05-15 DIAGNOSIS — K5909 Other constipation: Secondary | ICD-10-CM | POA: Diagnosis not present

## 2024-05-15 DIAGNOSIS — Z9181 History of falling: Secondary | ICD-10-CM | POA: Diagnosis not present

## 2024-05-15 DIAGNOSIS — E559 Vitamin D deficiency, unspecified: Secondary | ICD-10-CM | POA: Diagnosis not present

## 2024-05-15 DIAGNOSIS — Z79899 Other long term (current) drug therapy: Secondary | ICD-10-CM | POA: Diagnosis not present

## 2024-05-15 DIAGNOSIS — F17210 Nicotine dependence, cigarettes, uncomplicated: Secondary | ICD-10-CM | POA: Diagnosis not present

## 2024-05-15 DIAGNOSIS — M5416 Radiculopathy, lumbar region: Secondary | ICD-10-CM | POA: Diagnosis not present

## 2024-05-15 DIAGNOSIS — Z794 Long term (current) use of insulin: Secondary | ICD-10-CM | POA: Diagnosis not present

## 2024-05-17 DIAGNOSIS — Z79899 Other long term (current) drug therapy: Secondary | ICD-10-CM | POA: Diagnosis not present

## 2024-05-24 DIAGNOSIS — H35351 Cystoid macular degeneration, right eye: Secondary | ICD-10-CM | POA: Diagnosis not present

## 2024-05-24 DIAGNOSIS — H30041 Focal chorioretinal inflammation, macular or paramacular, right eye: Secondary | ICD-10-CM | POA: Diagnosis not present

## 2024-05-24 DIAGNOSIS — H33191 Other retinoschisis and retinal cysts, right eye: Secondary | ICD-10-CM | POA: Diagnosis not present

## 2024-05-24 DIAGNOSIS — H401113 Primary open-angle glaucoma, right eye, severe stage: Secondary | ICD-10-CM | POA: Diagnosis not present

## 2024-05-24 DIAGNOSIS — H401122 Primary open-angle glaucoma, left eye, moderate stage: Secondary | ICD-10-CM | POA: Diagnosis not present

## 2024-05-30 ENCOUNTER — Telehealth: Payer: Self-pay | Admitting: *Deleted

## 2024-05-30 NOTE — Telephone Encounter (Signed)
 Pt stated someone had called her from the [phone] number and no one left a message.; there's no telephone note in her chart.

## 2024-05-30 NOTE — Telephone Encounter (Signed)
 Copied from CRM [id number]. Topic: General - Other >> May 30, 2024 11:38 AM Retta Caster wrote: Reason for CRM: Patient returning call from office. No notes needs call back [phone]

## 2024-06-04 NOTE — Progress Notes (Unsigned)
 [facility]: Clinic Note  Subjective:  History of Present Illness: Susan Holmes is a 68 y.o. year old female who presents for routine follow up of her chronic medical conditions.  Her A1C is down to 6.1 today. She's been working on eating healthy foods.  Her main concern today is insomnia. She has trouble going to sleep, feels like her mind is racing. She sees Susan Holmes at [facility] and is on Zyprexa  & Trazadone at night, though I don't have these records. No recent stressors. Not watching TV or using phone before bed.   Her chronic right hip pain & left shoulder pain continue to bother her. She still sees a Pain Management doctor at [facility] and still has not called orthopedic surgery to schedule an appointment.   Smoking 1/2 ppd.    Please refer to Assessment and Plan below for full details in Problem-Based Charting.   Past Medical History:  Patient Active Problem List   Diagnosis Date Noted   Insomnia 06/06/2024   Rotator cuff impingement syndrome 11/09/2023   Coronary artery disease 05/03/2023   Delayed gastric emptying 12/31/2020   Sensorineural hearing loss (SNHL), bilateral 10/23/2020   Polypharmacy 09/09/2020   Peripheral arterial disease (HCC) 10/22/2019   Type 2 diabetes mellitus (HCC) 10/04/2019   Reactive airway disease 06/20/2018   History of CVA (cerebrovascular accident) 07/23/2016   Trochanteric bursitis of right hip 07/04/2015   Calcific tendinitis of right shoulder 02/26/2012   TOBACCO ABUSE 06/12/2007   Depression 06/12/2007   Essential hypertension 06/12/2007      Medications:  Current Outpatient Medications:    mirtazapine  (REMERON ) 15 MG tablet, Take 1 tablet (15 mg total) by mouth at bedtime., Disp: 90 tablet, Rfl: 0   amLODipine  (NORVASC ) 10 MG tablet, Take 1 tablet (10 mg total) by mouth daily., Disp: 90 tablet, Rfl: 3   aspirin  EC 81 MG tablet, Take 1 tablet (81 mg total) by mouth daily., Disp: 90 tablet, Rfl: 1   baclofen  (LIORESAL) 10 MG tablet, Take 10 mg by mouth 3 (three) times daily as needed., Disp: , Rfl:    Blood Glucose Monitoring Suppl (ONETOUCH VERIO) w/Device KIT, Use as directed, Disp: 1 kit, Rfl: 0   buprenorphine (BUTRANS) 10 MCG/HR PTWK, 1 patch once a week., Disp: , Rfl:    diclofenac  Sodium (VOLTAREN ) 1 % GEL, Apply 4 g topically 3 (three) times daily as needed (pain)., Disp: 150 g, Rfl: 2   Dulaglutide  (TRULICITY ) 4.5 MG/0.5ML SOAJ, Inject 4.5 mg as directed once a week., Disp: 6 mL, Rfl: 3   glucose blood (ONETOUCH VERIO) test strip, Use as instructed, Disp: 100 each, Rfl: 12   hydrOXYzine  (ATARAX ) 25 MG tablet, Take 25 mg by mouth 3 (three) times daily as needed., Disp: , Rfl:    insulin  glargine (LANTUS  SOLOSTAR) 100 UNIT/ML Solostar Pen, Inject 44 Units into the skin at bedtime., Disp: 15 mL, Rfl: 3   insulin  lispro (HUMALOG  KWIKPEN) 100 UNIT/ML KwikPen, Inject 6 Units into the skin 2 (two) times daily with a meal., Disp: 15 mL, Rfl: 1   Insulin  Pen Needle 32G X 4 MM MISC, Use to inject insulin  4 times a day, Disp: 360 each, Rfl: 3   irbesartan  (AVAPRO ) 300 MG tablet, Take 1 tablet (300 mg total) by mouth daily., Disp: 90 tablet, Rfl: 1   lidocaine  (LIDODERM ) 5 %, 1 Patch(s) Topical, Disp: , Rfl:    LINZESS 290 MCG CAPS capsule, Take 290 mcg by mouth daily as needed.,  Disp: , Rfl:    metFORMIN  (GLUCOPHAGE  XR) 500 MG 24 hr tablet, Take 1 tablet (500 mg total) by mouth 2 (two) times daily with a meal., Disp: 180 tablet, Rfl: 3   NARCAN 4 MG/0.1ML LIQD nasal spray kit, Place 1 spray into the nose once., Disp: , Rfl:    OLANZapine  (ZYPREXA ) 5 MG tablet, Take 1 tablet (5 mg total) by mouth at bedtime., Disp: , Rfl:    OneTouch Delica Lancets 33G MISC, Use with one touch Delica lancing device and Accu chek Guideme meter and Accu Chek guide strips to check your blood sugar up to 3 times a day, Disp: 100 each, Rfl: 11   oxyCODONE  (ROXICODONE ) 15 MG immediate release tablet, Take 15 mg by mouth every  other day as needed for pain. , Disp: , Rfl:    pantoprazole  (PROTONIX ) 40 MG tablet, Take 40 mg by mouth daily., Disp: , Rfl:    prednisoLONE  acetate (PRED FORTE ) 1 % ophthalmic suspension, Apply to eye., Disp: , Rfl:    ramelteon (ROZEREM) 8 MG tablet, Take 8 mg by mouth at bedtime., Disp: , Rfl:    rosuvastatin  (CRESTOR ) 20 MG tablet, TAKE 1 TABLET(20 MG) BY MOUTH AT BEDTIME, Disp: 90 tablet, Rfl: 0   rosuvastatin  (CRESTOR ) 20 MG tablet, TAKE 1 TABLET(20 MG) BY MOUTH AT BEDTIME, Disp: 15 tablet, Rfl: 0   traZODone  (DESYREL ) 100 MG tablet, Take 300 mg by mouth at bedtime., Disp: , Rfl:    venlafaxine XR (EFFEXOR-XR) 75 MG 24 hr capsule, Take 75 mg by mouth every morning., Disp: , Rfl:    Allergies: Allergies  Allergen Reactions   Ace Inhibitors Cough   Lipitor  An.Bailer ] Other (See Comments)    MYALGIA    Chantix  [Varenicline ] Nausea And Vomiting       Objective:   Vitals: Vitals:   06/06/24 1040  BP: 129/81  Pulse: 99  Temp: (!) 97.5 F (36.4 C)  SpO2: 99%     Physical Exam: Physical Exam Constitutional:      Appearance: Normal appearance.  Cardiovascular:     Pulses: Normal pulses.     Heart sounds: Normal heart sounds.  Pulmonary:     Effort: Pulmonary effort is normal.     Breath sounds: Normal breath sounds.  Neurological:     Mental Status: She is alert.  Psychiatric:        Mood and Affect: Mood normal.        Behavior: Behavior normal.      Data: Labs, imaging, and micro were reviewed in Epic. Refer to Assessment and Plan below for full details in Problem-Based Charting.  Assessment & Plan:  TOBACCO ABUSE - Smoking about 1/2 ppd. Precontemplative with quitting - Low dose CT chest ordered today  Depression - Patient follows with Jannifer Mention NP at [facility], where she is prescribed Buspar  10mg  daily, Olanzapine  5mg  at bedtime, Ramelteon 8mg  at bedtime, Trazodone  300mg  at bedtime, and Effexor 75mg  daily   Essential hypertension - chronic  and stable - continue amlodipine  10mg  daily & irbesartan  300mg  daily  History of CVA (cerebrovascular accident) - chronic & stable - continue aspirin  81mg  daily & rosuvastatin  20mg  daily   Type 2 diabetes mellitus (HCC) - A1C down to 6.1 today, great news - Continue Lantus  44 units daily + Humalog  6 units 1-2 times daily with meals - Continue Metformin  ER 500mg  BID - Continue Trulicity  4.5mg  weekly - Foot exam done today - Requested eye exam records today - Up to date  on [facility]  Insomnia - Will restart a low dose of Mirtazapine  at 15mg  nightly (was previously on 45mg  nightly) - Her sleep hygiene is already good - She is on many centrally acting agents, so I encouraged her to follow up with Aron Lard (her mental health provider) if this dose is not effective  Trochanteric bursitis of right hip - Voltaren  gel ordered today - Patient needs to follow up with Orthopedic Surgery, I gave her the number to call again     Patient will follow up in 3months   Driscilla George, MD

## 2024-06-06 ENCOUNTER — Encounter: Payer: Self-pay | Admitting: Internal Medicine

## 2024-06-06 ENCOUNTER — Ambulatory Visit (INDEPENDENT_AMBULATORY_CARE_PROVIDER_SITE_OTHER): Admitting: Internal Medicine

## 2024-06-06 VITALS — BP 129/81 | HR 99 | Temp 97.5°F | Ht 67.0 in | Wt 164.2 lb

## 2024-06-06 DIAGNOSIS — E1165 Type 2 diabetes mellitus with hyperglycemia: Secondary | ICD-10-CM

## 2024-06-06 DIAGNOSIS — E119 Type 2 diabetes mellitus without complications: Secondary | ICD-10-CM | POA: Diagnosis not present

## 2024-06-06 DIAGNOSIS — Z7985 Long-term (current) use of injectable non-insulin antidiabetic drugs: Secondary | ICD-10-CM | POA: Diagnosis not present

## 2024-06-06 DIAGNOSIS — I10 Essential (primary) hypertension: Secondary | ICD-10-CM | POA: Diagnosis not present

## 2024-06-06 DIAGNOSIS — F17200 Nicotine dependence, unspecified, uncomplicated: Secondary | ICD-10-CM

## 2024-06-06 DIAGNOSIS — G4700 Insomnia, unspecified: Secondary | ICD-10-CM | POA: Insufficient documentation

## 2024-06-06 DIAGNOSIS — Z794 Long term (current) use of insulin: Secondary | ICD-10-CM | POA: Diagnosis not present

## 2024-06-06 DIAGNOSIS — F32A Depression, unspecified: Secondary | ICD-10-CM

## 2024-06-06 DIAGNOSIS — Z7984 Long term (current) use of oral hypoglycemic drugs: Secondary | ICD-10-CM

## 2024-06-06 DIAGNOSIS — Z122 Encounter for screening for malignant neoplasm of respiratory organs: Secondary | ICD-10-CM

## 2024-06-06 DIAGNOSIS — M7061 Trochanteric bursitis, right hip: Secondary | ICD-10-CM | POA: Diagnosis not present

## 2024-06-06 DIAGNOSIS — F17210 Nicotine dependence, cigarettes, uncomplicated: Secondary | ICD-10-CM

## 2024-06-06 DIAGNOSIS — Z8673 Personal history of transient ischemic attack (TIA), and cerebral infarction without residual deficits: Secondary | ICD-10-CM

## 2024-06-06 DIAGNOSIS — G4709 Other insomnia: Secondary | ICD-10-CM

## 2024-06-06 LAB — GLUCOSE, CAPILLARY: Glucose-Capillary: 107 mg/dL — ABNORMAL HIGH (ref 70–99)

## 2024-06-06 LAB — POCT GLYCOSYLATED HEMOGLOBIN (HGB A1C): Hemoglobin A1C: 6.1 % — AB (ref 4.0–5.6)

## 2024-06-06 MED ORDER — DICLOFENAC SODIUM 1 % EX GEL: 4.0000 g | Freq: Three times a day (TID) | CUTANEOUS | 2 refills | Status: DC | PRN

## 2024-06-06 MED ORDER — MIRTAZAPINE 15 MG PO TABS: 15.0000 mg | ORAL_TABLET | Freq: Every day | ORAL | 0 refills | Status: DC

## 2024-06-06 NOTE — Assessment & Plan Note (Signed)
-   Will restart a low dose of Mirtazapine  at 15mg  nightly (was previously on 45mg  nightly) - Her sleep hygiene is already good - She is on many centrally acting agents, so I encouraged her to follow up with Aron Lard (her mental health provider) if this dose is not effective

## 2024-06-06 NOTE — Assessment & Plan Note (Signed)
-   chronic and stable - continue amlodipine  10mg  daily & irbesartan  300mg  daily

## 2024-06-06 NOTE — Assessment & Plan Note (Signed)
-   A1C down to 6.1 today, great news - Continue Lantus  44 units daily + Humalog  6 units 1-2 times daily with meals - Continue Metformin  ER 500mg  BID - Continue Trulicity  4.5mg  weekly - Foot exam done today - Requested eye exam records today - Up to date on [facility]

## 2024-06-06 NOTE — Assessment & Plan Note (Signed)
-   Patient follows with Jannifer Mention NP at [facility], where she is prescribed Buspar  10mg  daily, Olanzapine  5mg  at bedtime, Ramelteon 8mg  at bedtime, Trazodone  300mg  at bedtime, and Effexor 75mg  daily

## 2024-06-06 NOTE — Patient Instructions (Addendum)
 Thank you, [name] for allowing us  to provide your care today. Today we discussed your diabetes, your hip & shoulder pain, and lung cancer screening.    I have ordered the following labs for you:   Lab Orders         Glucose, capillary         POC Hbg A1C      Tests ordered today:  CT chest for lung cancer screening  Referrals ordered today:    Please call the Orthopedic Surgery office at [phone] to re-open the referral we placed and schedule an appointment to discuss shoulder and/or hip injections  I have ordered the following medication/changed the following medications:   Start the following medications: Meds ordered this encounter  Medications   diclofenac  Sodium (VOLTAREN ) 1 % GEL    Sig: Apply 4 g topically 3 (three) times daily as needed (pain).    Dispense:  150 g    Refill:  2   mirtazapine  (REMERON ) 15 MG tablet    Sig: Take 1 tablet (15 mg total) by mouth at bedtime.    Dispense:  90 tablet    Refill:  0     Return in about 6 months (around 12/06/2024).    Remember:  - When you see your Psychiatrist, please let him know that I started you back on Mirtazapine . If your sleep is still not better, he may want to increase the dose of this medicine or change up some of your other medicines for your mental health  - The Radiology team will call you to schedule your Lung CT  Should you have any questions or concerns please call the internal medicine clinic at [phone].     Susan Hands, MD Faculty, Internal Medicine Teaching [facility]

## 2024-06-06 NOTE — Assessment & Plan Note (Signed)
-   chronic & stable - continue aspirin 81mg  daily & rosuvastatin 20mg  daily

## 2024-06-06 NOTE — Assessment & Plan Note (Signed)
-   Smoking about 1/2 ppd. Precontemplative with quitting - Low dose CT chest ordered today

## 2024-06-06 NOTE — Assessment & Plan Note (Signed)
-   Voltaren  gel ordered today - Patient needs to follow up with Orthopedic Surgery, I gave her the number to call again

## 2024-06-12 DIAGNOSIS — K5909 Other constipation: Secondary | ICD-10-CM | POA: Diagnosis not present

## 2024-06-12 DIAGNOSIS — G8929 Other chronic pain: Secondary | ICD-10-CM | POA: Diagnosis not present

## 2024-06-12 DIAGNOSIS — M25512 Pain in left shoulder: Secondary | ICD-10-CM | POA: Diagnosis not present

## 2024-06-12 DIAGNOSIS — I10 Essential (primary) hypertension: Secondary | ICD-10-CM | POA: Diagnosis not present

## 2024-06-12 DIAGNOSIS — M129 Arthropathy, unspecified: Secondary | ICD-10-CM | POA: Diagnosis not present

## 2024-06-12 DIAGNOSIS — M5416 Radiculopathy, lumbar region: Secondary | ICD-10-CM | POA: Diagnosis not present

## 2024-06-12 DIAGNOSIS — F17210 Nicotine dependence, cigarettes, uncomplicated: Secondary | ICD-10-CM | POA: Diagnosis not present

## 2024-06-12 DIAGNOSIS — I2510 Atherosclerotic heart disease of native coronary artery without angina pectoris: Secondary | ICD-10-CM | POA: Diagnosis not present

## 2024-06-12 DIAGNOSIS — E1140 Type 2 diabetes mellitus with diabetic neuropathy, unspecified: Secondary | ICD-10-CM | POA: Diagnosis not present

## 2024-06-12 DIAGNOSIS — E559 Vitamin D deficiency, unspecified: Secondary | ICD-10-CM | POA: Diagnosis not present

## 2024-06-12 DIAGNOSIS — Z794 Long term (current) use of insulin: Secondary | ICD-10-CM | POA: Diagnosis not present

## 2024-06-12 DIAGNOSIS — Z79899 Other long term (current) drug therapy: Secondary | ICD-10-CM | POA: Diagnosis not present

## 2024-06-12 DIAGNOSIS — Z9181 History of falling: Secondary | ICD-10-CM | POA: Diagnosis not present

## 2024-06-14 DIAGNOSIS — Z79899 Other long term (current) drug therapy: Secondary | ICD-10-CM | POA: Diagnosis not present

## 2024-06-26 ENCOUNTER — Other Ambulatory Visit: Payer: Self-pay

## 2024-06-26 DIAGNOSIS — I10 Essential (primary) hypertension: Secondary | ICD-10-CM

## 2024-06-26 MED ORDER — IRBESARTAN 300 MG PO TABS: 300.0000 mg | ORAL_TABLET | Freq: Every day | ORAL | 1 refills | Status: DC

## 2024-06-27 ENCOUNTER — Ambulatory Visit: Admitting: Podiatry

## 2024-07-01 NOTE — Progress Notes (Unsigned)
 Cardiology Office Note:    Date:  07/04/2024   ID:  ZAN TRISKA, DOB April 20, 1956, MRN [id number]  PCP:  Shawn Sick, MD   [facility] Providers Cardiologist:  Dorn Lesches, MD Cardiology APP:  Madie Jon Garre, [place]     Referring MD: Lovie Clarity, MD   Chief Complaint  Patient presents with   Follow-up    HTN    History of Present Illness:    Susan Holmes is a 68 y.o. female with a hx of nonobstructive CAD, carotid artery stenosis, DM 2, hypertension, hyperlipidemia, history of CVA, PAD s/p left femoral to popliteal grafting with reversed ipsilateral SVG, tobacco abuse, chronic systolic heart failure by echocardiogram in 2015.  TTE in 2015 with LVEF 40-45%.  LHC showed nonobstructive CAD.  Myoview  in 2017 for chest pain showed no evidence of ischemia.  She has a history of ischemic stroke and TEE in 2017 showed LVEF 50-55% with mild MR.  No ASD or PFO.  She is statin intolerant.  A 30-day heart monitor for palpitations showed occasional PACs and PVCs.  Last Myoview  2019 for chest pain was negative for ischemia.  She was last seen in January 2024 for preoperative risk risk evaluation prior to colonoscopy.  She presents for annual cardiology follow-up, although now considered a new patient.  No cardiac symptoms, no syncope. She reports ongoing left leg pain with walking and requests referral to [facility] to re-establish care, they performed her last PAD intervention. Excellent LDL control on 20 mg crestor . No SOB or orthopnea, she continues to smoke cigarettes. She is sedentary, we discussed walking program.     Past Medical History:  Diagnosis Date   Anxiety    Arthritis    Callus of foot 09/19/2018   Chest pain 09/26/2018   Claudication (HCC) 09/14/2019   Congenital blindness    Left  eye   CONGENITAL BLINDNESS, LEFT EYE 06/12/2007   Qualifier: Diagnosis of   By: Drinkard MSN, FNP-C, Sue         Coronary artery disease    per cardiac cath 2015-- mild disease  involving LAD and branches ,  LCFx and branches, and mild disease pRCA   Cough 07/08/2016   Depression    Diabetes mellitus, type II (HCC)    followed by pcp   Erosive gastropathy 12/31/2020   She also had an upper GI endoscopy on 09/28/2013 which revealed a normal esophagus without masses or strictures, esophagitis, eosinophilic esophagitis. There was a few dispersed small erosions found in the prepyloric region of the stomach.      Glaucoma    Glaucoma, both eyes    Hepatitis B infection pt unsure   resolved   History of colonic polyps 05/17/2022   IMO SNOMED Dx Update Oct 2024     History of kidney stones    right stone   History of transient ischemic attack (TIA) 2017   per pt no residuals   History of vitamin D  deficiency 09/06/2017   HTN (hypertension)    followed by pcp   Hyperlipidemia    Hyperparathyroidism (HCC) 03/08/2013   Appears to be diagnosed around 02/2013:  PTH 109.9  Vit d 25-OH 02/2013: 19  Dexa 02/2013: Patient's diagnostic category is NORMAL by WHO Criteria. FRACTURE RISK: NOT INCREASED FRAX: Not calculated due to a T score at or above -1.0.    Serum calcium  initially 11.8  Ionized calcium  elevated 1.45        Liver hemangioma 04/04/2014  Lumbar spondylosis 06/05/2021   Osteoarthritis 12/11/2014   PAD (peripheral artery disease) (HCC)    Paronychia of great toe, left 06/21/2019   Poor dental hygiene    Stroke (HCC)    Tobacco abuse    .5 PPD smoker   Uncontrolled type II diabetes mellitus 06/12/2007   Qualifier: Diagnosis of  By: Drinkard MSN, FNP-C, Beverley      Past Surgical History:  Procedure Laterality Date   ABDOMINAL AORTOGRAM W/LOWER EXTREMITY N/A 09/14/2019   Procedure: ABDOMINAL AORTOGRAM W/LOWER EXTREMITY;  Surgeon: Harvey Carlin BRAVO, MD;  Location: [facility] INVASIVE CV LAB;  Service: Cardiovascular;  Laterality: N/A;   ABDOMINAL HYSTERECTOMY  1980s   unsure if ovaries were taken   BYPASS GRAFT Left 10/22/2019    Left femoral to above-knee popliteal  bypass using reversed ipsilateral greater saphenous vein   colonoscopy     FEMORAL-POPLITEAL BYPASS GRAFT Left 10/22/2019   Procedure: LEFT FEMORAL-ABOVE KNEE POPLITEAL  BYPASS GRAFT with REVERSED GREATER SAPHENOUS VEIN;  Surgeon: Harvey Carlin BRAVO, MD;  Location: [facility] OR;  Service: Vascular;  Laterality: Left;   GLAUCOMA SURGERY Right 2017   IR URETERAL STENT RIGHT NEW ACCESS W/O SEP NEPHROSTOMY CATH  10/03/2018   LEFT HEART CATHETERIZATION WITH CORONARY ANGIOGRAM N/A 10/09/2014   Procedure: LEFT HEART CATHETERIZATION WITH CORONARY ANGIOGRAM;  Surgeon: Debby DELENA Sor, MD;  Location: [facility] CATH LAB;  Service: Cardiovascular;  Laterality: N/A;   NEPHROLITHOTOMY Right 10/03/2018   Procedure: RIGHT NEPHROLITHOTOMY PERCUTANEOUS FIRST STAGE;  Surgeon: Watt Rush, MD;  Location: [facility] ORS;  Service: Urology;  Laterality: Right;   PERIPHERAL VASCULAR INTERVENTION Left 09/14/2019   Procedure: PERIPHERAL VASCULAR INTERVENTION;  Surgeon: Harvey Carlin BRAVO, MD;  Location: [facility] INVASIVE CV LAB;  Service: Cardiovascular;  Laterality: Left;   left external iliac stent    TEE WITHOUT CARDIOVERSION N/A 08/17/2016   Procedure: TRANSESOPHAGEAL ECHOCARDIOGRAM (TEE);  Surgeon: Wilbert JONELLE Bihari, MD;  Location: [facility] ENDOSCOPY;  Service: Cardiovascular;  Laterality: N/A;    Current Medications: Current Meds  Medication Sig   amLODipine  (NORVASC ) 10 MG tablet Take 1 tablet (10 mg total) by mouth daily.   aspirin  EC 81 MG tablet Take 1 tablet (81 mg total) by mouth daily.   baclofen (LIORESAL) 10 MG tablet Take 10 mg by mouth 3 (three) times daily as needed.   Blood Glucose Monitoring Suppl (CONTOUR PLUS BLUE) w/Device KIT 1 each by Does not apply route 3 (three) times daily.   Blood Glucose Monitoring Suppl (ONETOUCH VERIO) w/Device KIT Use as directed   buprenorphine (BUTRANS) 10 MCG/HR PTWK 1 patch once a week.   celecoxib  (CELEBREX ) 100 MG capsule Take 100 mg by mouth 2 (two) times daily.   diclofenac  Sodium (VOLTAREN ) 1 % GEL  Apply 4 g topically 3 (three) times daily as needed (pain).   Dulaglutide  (TRULICITY ) 4.5 MG/0.5ML SOAJ Inject 4.5 mg as directed once a week.   glucose blood (ONETOUCH VERIO) test strip Use as instructed   glucose blood test strip Use as instructed   hydrOXYzine  (ATARAX ) 25 MG tablet Take 25 mg by mouth 3 (three) times daily as needed.   insulin  glargine (LANTUS  SOLOSTAR) 100 UNIT/ML Solostar Pen Inject 44 Units into the skin at bedtime.   insulin  lispro (HUMALOG  KWIKPEN) 100 UNIT/ML KwikPen Inject 6 Units into the skin 2 (two) times daily with a meal.   Insulin  Pen Needle 32G X 4 MM MISC Use to inject insulin  4 times a day   irbesartan  (AVAPRO ) 300 MG tablet Take 1  tablet (300 mg total) by mouth daily.   lamoTRIgine (LAMICTAL) 25 MG tablet Take 50 mg by mouth daily.   lidocaine  (LIDODERM ) 5 % 1 Patch(s) Topical   LINZESS 290 MCG CAPS capsule Take 290 mcg by mouth daily as needed.   metFORMIN  (GLUCOPHAGE  XR) 500 MG 24 hr tablet Take 1 tablet (500 mg total) by mouth 2 (two) times daily with a meal.   mirtazapine  (REMERON ) 15 MG tablet Take 1 tablet (15 mg total) by mouth at bedtime.   NARCAN 4 MG/0.1ML LIQD nasal spray kit Place 1 spray into the nose once.   OLANZapine  (ZYPREXA ) 5 MG tablet Take 1 tablet (5 mg total) by mouth at bedtime.   OneTouch Delica Lancets 33G MISC Use with one touch Delica lancing device and Accu chek Guideme meter and Accu Chek guide strips to check your blood sugar up to 3 times a day   oxyCODONE  (ROXICODONE ) 15 MG immediate release tablet Take 15 mg by mouth every other day as needed for pain.    prednisoLONE  acetate (PRED FORTE ) 1 % ophthalmic suspension Apply to eye.   ramelteon (ROZEREM) 8 MG tablet Take 8 mg by mouth at bedtime.   rosuvastatin  (CRESTOR ) 20 MG tablet TAKE 1 TABLET(20 MG) BY MOUTH AT BEDTIME   rosuvastatin  (CRESTOR ) 20 MG tablet TAKE 1 TABLET(20 MG) BY MOUTH AT BEDTIME   traZODone  (DESYREL ) 100 MG tablet Take 300 mg by mouth at bedtime.    venlafaxine XR (EFFEXOR-XR) 75 MG 24 hr capsule Take 75 mg by mouth every morning.   Vitamin D , Ergocalciferol , (DRISDOL ) 1.25 MG (50000 UNIT) CAPS capsule Take 50,000 Units by mouth once a week.     Allergies:   Ace inhibitors, Lipitor  [atorvastatin ], and Chantix  [varenicline ]   Social History   Socioeconomic History   Marital status: Divorced    Spouse name: Not on file   Number of children: 1   Years of education: 12   Highest education level: Not on file  Occupational History   Occupation: Disabled  Tobacco Use   Smoking status: Every Day    Current packs/day: 0.50    Average packs/day: 0.5 packs/day for 44.5 years (22.3 ttl pk-yrs)    Types: Cigarettes    Start date: 87   Smokeless tobacco: Current    Types: Chew   Tobacco comments:    6 cigs per day  Vaping Use   Vaping status: Never Used  Substance and Sexual Activity   Alcohol use: No    Alcohol/week: 0.0 standard drinks of alcohol   Drug use: No   Sexual activity: Not Currently    Birth control/protection: Surgical  Other Topics Concern   Not on file  Social History Narrative   Current Social History 08/18/2020        Patient lives alone in a ground floor apartment which is 1 story. There are 13 steps with handrail up to the entrance the patient uses.       Patient's method of transportation is [facility] transportation.      The highest level of education was 2 years college.      The patient currently disabled.      Identified important Relationships are My daughter, brothers, grands and great-grands.       Pets : chihuahua Catering manager)       Interests / Fun: I don't       Current Stressors: Nothing       Religious / Personal Beliefs: 'Baptist  Other: I am a very loving person      L. Ducatte, BSN, RN-BC        Social Drivers of Health   Financial Resource Strain: Low Risk  (03/21/2024)   Overall Financial Resource Strain (CARDIA)    Difficulty of Paying Living Expenses: Not hard at all   Food Insecurity: No Food Insecurity (03/21/2024)   Hunger Vital Sign    Worried About Running Out of Food in the Last Year: Never true    Ran Out of Food in the Last Year: Never true  Transportation Needs: No Transportation Needs (03/21/2024)   PRAPARE - Administrator, Civil Service (Medical): No    Lack of Transportation (Non-Medical): No  Physical Activity: Inactive (03/21/2024)   Exercise Vital Sign    Days of Exercise per Week: 0 days    Minutes of Exercise per Session: 0 min  Stress: No Stress Concern Present (03/21/2024)   [employer] of Occupational Health - Occupational Stress Questionnaire    Feeling of Stress : Not at all  Social Connections: Moderately Integrated (03/21/2024)   Social Connection and Isolation Panel    Frequency of Communication with Friends and Family: More than three times a week    Frequency of Social Gatherings with Friends and Family: Once a week    Attends Religious Services: More than 4 times per year    Active Member of [employer] or Organizations: No    Attends Engineer, structural: More than 4 times per year    Marital Status: Divorced     Family History: The patient's family history includes Aneurysm in her brother and sister; CVA in her brother, maternal grandfather, maternal grandmother, mother, and sister; Heart disease in her sister; Heart disease (age of onset: 63) in her father and mother; Hypertension in her brother and mother. There is no history of Colon cancer, Esophageal cancer, or Stomach cancer.  ROS:   Please see the history of present illness.     All other systems reviewed and are negative.  EKGs/Labs/Other Studies Reviewed:    The following studies were reviewed today:  EKG Interpretation Date/Time:  Wednesday July 04 2024 09:14:01 EDT Ventricular Rate:  96 PR Interval:  124 QRS Duration:  98 QT Interval:  376 QTC Calculation: 475 R Axis:   -12  Text Interpretation: Normal sinus rhythm Minimal  voltage criteria for LVH, may be normal variant ( Cornell product ) When compared with ECG of 11-Oct-2020 17:36, PREVIOUS ECG IS PRESENT Confirmed by Madie Slough ([id number]) on 07/04/2024 9:36:29 AM    Recent Labs: 09/28/2023: BUN 6; Creatinine, Ser 0.85; Potassium 4.4; Sodium 141  Recent Lipid Panel    Component Value Date/Time   CHOL 102 09/28/2023 1030   TRIG 56 09/28/2023 1030   HDL 51 09/28/2023 1030   CHOLHDL 2.0 09/28/2023 1030   CHOLHDL 3.4 04/26/2017 1403   VLDL 31 (H) 04/26/2017 1403   LDLCALC 38 09/28/2023 1030   LDLDIRECT 183 (H) 03/01/2014 0500     Risk Assessment/Calculations:                Physical Exam:    VS:  BP 136/88 (BP Location: Left Arm, Patient Position: Sitting, Cuff Size: Normal)   Pulse 96   Ht 5' 7 (1.702 m)   Wt 163 lb (73.9 kg)   BMI 25.53 kg/m     Wt Readings from Last 3 Encounters:  07/04/24 163 lb (73.9 kg)  06/06/24 164 lb 3.2 oz (74.5  kg)  04/20/24 172 lb 12.8 oz (78.4 kg)     GEN:  Well nourished, well developed in no acute distress HEENT: Normal NECK: No JVD; No carotid bruits LYMPHATICS: No lymphadenopathy CARDIAC: RRR, no murmurs, rubs, gallops RESPIRATORY:  Clear to auscultation without rales, wheezing or rhonchi  ABDOMEN: Soft, non-tender, non-distended MUSCULOSKELETAL:  No edema; No deformity  SKIN: Warm and dry NEUROLOGIC:  Alert and oriented x 3 PSYCHIATRIC:  Normal affect   ASSESSMENT:    1. PVC's (premature ventricular contractions)   2. Stenosis of carotid artery, unspecified laterality   3. PAD (peripheral artery disease) (HCC)   4. Left leg claudication (HCC)    PLAN:    In order of problems listed above:  Nonobstructive CAD - LHC in 2015 as a result of reduced EF on echocardiogram - Myoview  2019 with no ischemia - Continue 81 mg aspirin , continue 20 mg Crestor    Hypertension - Continue 300 mg irbesartan , 10 mg amlodipine  -- not taking BP at home, recommend log   Hyperlipidemia with LDL goal less  than 55 - Consider lower LDL goal given smoking and insulin -dependent diabetes - Has been statin intolerant in the past - Continue 20 mg Crestor  - LDL was 38 in 2024   History of stroke Carotid artery stenosis - Due to repeat carotid artery ultrasound - will order   PAD - recent ABIs reassuring in 10/2023 - she reports recent intermittent swelling and pain with walking in left leg - will refer back to [facility]   She is unable to travel to [place] to see Dr. Raford. Given PAD history, will assign to Dr. Court as DOD today.  Follow up with Dr. Court in 1 year.        Medication Adjustments/Labs and Tests Ordered: Current medicines are reviewed at length with the patient today.  Concerns regarding medicines are outlined above.  Orders Placed This Encounter  Procedures   Ambulatory referral to Vascular Surgery   EKG 12-Lead   VAS US  CAROTID   No orders of the defined types were placed in this encounter.   Patient Instructions  Medication Instructions:  Your physician recommends that you continue on your current medications as directed. Please refer to the Current Medication list given to you today.  *If you need a refill on your cardiac medications before your next appointment, please call your pharmacy*  Lab Work: NONE ordered at this time of appointment   Testing/Procedures: Your physician has requested that you have a carotid duplex. This test is an ultrasound of the carotid arteries in your neck. It looks at blood flow through these arteries that supply the brain with blood. Allow one hour for this exam. There are no restrictions or special instructions.   Follow-Up: At [facility], you and your health needs are our priority.  As part of our continuing mission to provide you with exceptional heart care, our providers are all part of one team.  This team includes your primary Cardiologist (physician) and Advanced Practice Providers or APPs (Physician  Assistants and Nurse Practitioners) who all work together to provide you with the care you need, when you need it.  Your next appointment:   1 year(s)  Provider:   Dr. Court    We recommend signing up for the patient portal called MyChart.  Sign up information is provided on this After Visit Summary.  MyChart is used to connect with patients for Virtual Visits (Telemedicine).  Patients are able to view lab/test results, encounter  notes, upcoming appointments, etc.  Non-urgent messages can be sent to your provider as well.   To learn more about what you can do with MyChart, go to [email or web address].         Signed, Jon Nat Hails, [place]  07/04/2024 10:13 AM    [facility]

## 2024-07-02 ENCOUNTER — Telehealth: Payer: Self-pay | Admitting: *Deleted

## 2024-07-02 MED ORDER — GLUCOSE BLOOD VI STRP: ORAL_STRIP | 12 refills | Status: AC

## 2024-07-02 MED ORDER — CONTOUR PLUS BLUE W/DEVICE KIT: 1.0000 | PACK | Freq: Three times a day (TID) | 3 refills | Status: AC

## 2024-07-02 NOTE — Telephone Encounter (Signed)
 Copied from CRM [id number]. Topic: Clinical - Medication Question >> Jul 02, 2024 12:46 PM Susan Holmes wrote: Reason for CRM: Patient called stating that [facility] is changing her test strips to [facility] Test Strips. Patient is requesting a prescription for the new strips as well as the [employer]. Change will go into affect on 08/03. Please contact patient at [phone].

## 2024-07-04 ENCOUNTER — Ambulatory Visit: Attending: Physician Assistant | Admitting: Physician Assistant

## 2024-07-04 ENCOUNTER — Encounter: Payer: Self-pay | Admitting: Physician Assistant

## 2024-07-04 VITALS — BP 136/88 | HR 96 | Ht 67.0 in | Wt 163.0 lb

## 2024-07-04 DIAGNOSIS — F17200 Nicotine dependence, unspecified, uncomplicated: Secondary | ICD-10-CM | POA: Diagnosis not present

## 2024-07-04 DIAGNOSIS — I10 Essential (primary) hypertension: Secondary | ICD-10-CM | POA: Diagnosis not present

## 2024-07-04 DIAGNOSIS — I2510 Atherosclerotic heart disease of native coronary artery without angina pectoris: Secondary | ICD-10-CM | POA: Diagnosis not present

## 2024-07-04 DIAGNOSIS — I739 Peripheral vascular disease, unspecified: Secondary | ICD-10-CM | POA: Diagnosis not present

## 2024-07-04 DIAGNOSIS — I493 Ventricular premature depolarization: Secondary | ICD-10-CM | POA: Diagnosis not present

## 2024-07-04 DIAGNOSIS — Z8673 Personal history of transient ischemic attack (TIA), and cerebral infarction without residual deficits: Secondary | ICD-10-CM

## 2024-07-04 DIAGNOSIS — E785 Hyperlipidemia, unspecified: Secondary | ICD-10-CM | POA: Diagnosis not present

## 2024-07-04 DIAGNOSIS — I6529 Occlusion and stenosis of unspecified carotid artery: Secondary | ICD-10-CM | POA: Diagnosis not present

## 2024-07-04 NOTE — Patient Instructions (Addendum)
 Medication Instructions:  Your physician recommends that you continue on your current medications as directed. Please refer to the Current Medication list given to you today.  *If you need a refill on your cardiac medications before your next appointment, please call your pharmacy*  Lab Work: NONE ordered at this time of appointment   Testing/Procedures: Your physician has requested that you have a carotid duplex. This test is an ultrasound of the carotid arteries in your neck. It looks at blood flow through these arteries that supply the brain with blood. Allow one hour for this exam. There are no restrictions or special instructions.   Follow-Up: At [facility], you and your health needs are our priority.  As part of our continuing mission to provide you with exceptional heart care, our providers are all part of one team.  This team includes your primary Cardiologist (physician) and Advanced Practice Providers or APPs (Physician Assistants and Nurse Practitioners) who all work together to provide you with the care you need, when you need it.  Your next appointment:   1 year(s)  Provider:   Dr. Court    We recommend signing up for the patient portal called MyChart.  Sign up information is provided on this After Visit Summary.  MyChart is used to connect with patients for Virtual Visits (Telemedicine).  Patients are able to view lab/test results, encounter notes, upcoming appointments, etc.  Non-urgent messages can be sent to your provider as well.   To learn more about what you can do with MyChart, go to [email or web address].

## 2024-07-05 DIAGNOSIS — Z780 Asymptomatic menopausal state: Secondary | ICD-10-CM | POA: Diagnosis not present

## 2024-07-10 ENCOUNTER — Encounter: Payer: Self-pay | Admitting: *Deleted

## 2024-07-10 DIAGNOSIS — E1140 Type 2 diabetes mellitus with diabetic neuropathy, unspecified: Secondary | ICD-10-CM | POA: Diagnosis not present

## 2024-07-10 DIAGNOSIS — Z794 Long term (current) use of insulin: Secondary | ICD-10-CM | POA: Diagnosis not present

## 2024-07-10 DIAGNOSIS — G8929 Other chronic pain: Secondary | ICD-10-CM | POA: Diagnosis not present

## 2024-07-10 DIAGNOSIS — F17210 Nicotine dependence, cigarettes, uncomplicated: Secondary | ICD-10-CM | POA: Diagnosis not present

## 2024-07-10 DIAGNOSIS — M25512 Pain in left shoulder: Secondary | ICD-10-CM | POA: Diagnosis not present

## 2024-07-10 DIAGNOSIS — Z9181 History of falling: Secondary | ICD-10-CM | POA: Diagnosis not present

## 2024-07-10 DIAGNOSIS — E559 Vitamin D deficiency, unspecified: Secondary | ICD-10-CM | POA: Diagnosis not present

## 2024-07-10 DIAGNOSIS — M5416 Radiculopathy, lumbar region: Secondary | ICD-10-CM | POA: Diagnosis not present

## 2024-07-10 DIAGNOSIS — Z79899 Other long term (current) drug therapy: Secondary | ICD-10-CM | POA: Diagnosis not present

## 2024-07-10 DIAGNOSIS — K5909 Other constipation: Secondary | ICD-10-CM | POA: Diagnosis not present

## 2024-07-11 ENCOUNTER — Encounter: Payer: Self-pay | Admitting: Orthopedic Surgery

## 2024-07-11 ENCOUNTER — Ambulatory Visit (INDEPENDENT_AMBULATORY_CARE_PROVIDER_SITE_OTHER): Admitting: Orthopedic Surgery

## 2024-07-11 ENCOUNTER — Other Ambulatory Visit: Payer: Self-pay

## 2024-07-11 ENCOUNTER — Other Ambulatory Visit (INDEPENDENT_AMBULATORY_CARE_PROVIDER_SITE_OTHER)

## 2024-07-11 DIAGNOSIS — M65912 Unspecified synovitis and tenosynovitis, left shoulder: Secondary | ICD-10-CM

## 2024-07-11 DIAGNOSIS — G8929 Other chronic pain: Secondary | ICD-10-CM | POA: Diagnosis not present

## 2024-07-11 DIAGNOSIS — M25512 Pain in left shoulder: Secondary | ICD-10-CM

## 2024-07-11 NOTE — Progress Notes (Signed)
 Office Visit Note   Patient: Susan Holmes           Date of Birth: [birth year]           MRN: [id number] Visit Date: 07/11/2024 Requested by: Lovie Clarity, MD [address] [place], [address] [place],  [place] [address] PCP: Shawn Sick, MD  Subjective: Chief Complaint  Patient presents with   Right Hip - Pain   Left Shoulder - Pain    HPI: Susan Holmes is a 68 y.o. female who presents to the office reporting right hip pain and left shoulder pain.  Scribes some lateral sided right hip pain but denies any groin pain.  Not sensitive to touch.  Bursitis shot helped her in the past.  Describes a little bit of low back pain.  She has had physical therapy for her hip.  She also describes left shoulder pain for months without history of injury.  Reports decreased range of motion and pain with no weakness.  Denies any neck symptoms or radicular pain.  Her symptoms in the shoulder on the left-hand side have been going on for several months..                ROS: All systems reviewed are negative as they relate to the chief complaint within the history of present illness.  Patient denies fevers or chills.  Assessment & Plan: Visit Diagnoses:  1. Chronic left shoulder pain     Plan: Impression is right hip pain trochanteric bursitis with no arthritis on plain radiographs.  Continue with iliotibial band stretching.  Regarding the left shoulder manual joint injection performed today for likely early frozen shoulder and adhesive capsulitis.  Home exercise stretching encouraged.  Come back if her symptoms recur or worsen.  Follow-Up Instructions: No follow-ups on file.   Orders:  Orders Placed This Encounter  Procedures   XR Shoulder Left   US  Guided Needle Placement - No Linked Charges   No orders of the defined types were placed in this encounter.     Procedures: Large Joint Inj: L glenohumeral on 07/11/2024 11:24 AM Indications: diagnostic evaluation and pain Details: 25 G 3.5 in  needle, ultrasound-guided posterior approach  Arthrogram: No  Medications: 9 mL bupivacaine  0.5 %; 5 mL lidocaine  1 %; 40 mg triamcinolone  acetonide 40 MG/ML Outcome: tolerated well, no immediate complications Procedure, treatment alternatives, risks and benefits explained, specific risks discussed. Consent was given by the patient. Immediately prior to procedure a time out was called to verify the correct patient, procedure, equipment, support staff and site/side marked as required. Patient was prepped and draped in the usual sterile fashion.       Clinical Data: No additional findings.  Objective: Vital Signs: There were no vitals taken for this visit.  Physical Exam:  Constitutional: Patient appears well-developed HEENT:  Head: Normocephalic Eyes:EOM are normal Neck: Normal range of motion Cardiovascular: Normal rate Pulmonary/chest: Effort normal Neurologic: Patient is alert Skin: Skin is warm Psychiatric: Patient has normal mood and affect  Ortho Exam: Ortho exam demonstrates range of motion on the right hip 60/95/165 range of motion of the left is 50/85/140.  Rotator cuff strength intact bilaterally to infraspinatus supraspinatus and subscap muscle testing of note for sprain with popping or crepitus in the right or left shoulder.  Cervical spine range of motion full.  No paresthesias C5-T1.  Mild bursal sided tenderness on the right compared to the left but no groin pain with internal or external rotation of  either leg.  Specialty Comments:  No specialty comments available.  Imaging: No results found.   PMFS History: Patient Active Problem List   Diagnosis Date Noted   Insomnia 06/06/2024   Rotator cuff impingement syndrome 11/09/2023   Coronary artery disease 05/03/2023   Delayed gastric emptying 12/31/2020   Sensorineural hearing loss (SNHL), bilateral 10/23/2020   Polypharmacy 09/09/2020   Peripheral arterial disease (HCC) 10/22/2019   Type 2 diabetes  mellitus (HCC) 10/04/2019   Reactive airway disease 06/20/2018   History of CVA (cerebrovascular accident) 07/23/2016   Trochanteric bursitis of right hip 07/04/2015   Calcific tendinitis of right shoulder 02/26/2012   TOBACCO ABUSE 06/12/2007   Depression 06/12/2007   Essential hypertension 06/12/2007   Past Medical History:  Diagnosis Date   Anxiety    Arthritis    Callus of foot 09/19/2018   Chest pain 09/26/2018   Claudication (HCC) 09/14/2019   Congenital blindness    Left  eye   CONGENITAL BLINDNESS, LEFT EYE 06/12/2007   Qualifier: Diagnosis of   By: Drinkard MSN, FNP-C, Sue         Coronary artery disease    per cardiac cath 2015-- mild disease involving LAD and branches ,  LCFx and branches, and mild disease pRCA   Cough 07/08/2016   Depression    Diabetes mellitus, type II (HCC)    followed by pcp   Erosive gastropathy 12/31/2020   She also had an upper GI endoscopy on 09/28/2013 which revealed a normal esophagus without masses or strictures, esophagitis, eosinophilic esophagitis. There was a few dispersed small erosions found in the prepyloric region of the stomach.      Glaucoma    Glaucoma, both eyes    Hepatitis B infection pt unsure   resolved   History of colonic polyps 05/17/2022   IMO SNOMED Dx Update Oct 2024     History of kidney stones    right stone   History of transient ischemic attack (TIA) 2017   per pt no residuals   History of vitamin D  deficiency 09/06/2017   HTN (hypertension)    followed by pcp   Hyperlipidemia    Hyperparathyroidism (HCC) 03/08/2013   Appears to be diagnosed around 02/2013:  PTH 109.9  Vit d 25-OH 02/2013: 19  Dexa 02/2013: Patient's diagnostic category is NORMAL by WHO Criteria. FRACTURE RISK: NOT INCREASED FRAX: Not calculated due to a T score at or above -1.0.    Serum calcium  initially 11.8  Ionized calcium  elevated 1.45        Liver hemangioma 04/04/2014   Lumbar spondylosis 06/05/2021   Osteoarthritis 12/11/2014   PAD  (peripheral artery disease) (HCC)    Paronychia of great toe, left 06/21/2019   Poor dental hygiene    Stroke (HCC)    Tobacco abuse    .5 PPD smoker   Uncontrolled type II diabetes mellitus 06/12/2007   Qualifier: Diagnosis of  By: Drinkard MSN, FNP-C, Beverley      Family History  Problem Relation Age of Onset   Heart disease Mother 24   Hypertension Mother    CVA Mother    Heart disease Father 32   CVA Sister    Heart disease Sister    Aneurysm Sister    CVA Brother    Aneurysm Brother    CVA Maternal Grandmother    CVA Maternal Grandfather    Hypertension Brother    Colon cancer Neg Hx    Esophageal cancer Neg Hx  Stomach cancer Neg Hx     Past Surgical History:  Procedure Laterality Date   ABDOMINAL AORTOGRAM W/LOWER EXTREMITY N/A 09/14/2019   Procedure: ABDOMINAL AORTOGRAM W/LOWER EXTREMITY;  Surgeon: Harvey Carlin BRAVO, MD;  Location: [facility] INVASIVE CV LAB;  Service: Cardiovascular;  Laterality: N/A;   ABDOMINAL HYSTERECTOMY  1980s   unsure if ovaries were taken   BYPASS GRAFT Left 10/22/2019    Left femoral to above-knee popliteal bypass using reversed ipsilateral greater saphenous vein   colonoscopy     FEMORAL-POPLITEAL BYPASS GRAFT Left 10/22/2019   Procedure: LEFT FEMORAL-ABOVE KNEE POPLITEAL  BYPASS GRAFT with REVERSED GREATER SAPHENOUS VEIN;  Surgeon: Harvey Carlin BRAVO, MD;  Location: [facility] OR;  Service: Vascular;  Laterality: Left;   GLAUCOMA SURGERY Right 2017   IR URETERAL STENT RIGHT NEW ACCESS W/O SEP NEPHROSTOMY CATH  10/03/2018   LEFT HEART CATHETERIZATION WITH CORONARY ANGIOGRAM N/A 10/09/2014   Procedure: LEFT HEART CATHETERIZATION WITH CORONARY ANGIOGRAM;  Surgeon: Debby DELENA Sor, MD;  Location: [facility] CATH LAB;  Service: Cardiovascular;  Laterality: N/A;   NEPHROLITHOTOMY Right 10/03/2018   Procedure: RIGHT NEPHROLITHOTOMY PERCUTANEOUS FIRST STAGE;  Surgeon: Watt Rush, MD;  Location: [facility] ORS;  Service: Urology;  Laterality: Right;   PERIPHERAL VASCULAR  INTERVENTION Left 09/14/2019   Procedure: PERIPHERAL VASCULAR INTERVENTION;  Surgeon: Harvey Carlin BRAVO, MD;  Location: [facility] INVASIVE CV LAB;  Service: Cardiovascular;  Laterality: Left;   left external iliac stent    TEE WITHOUT CARDIOVERSION N/A 08/17/2016   Procedure: TRANSESOPHAGEAL ECHOCARDIOGRAM (TEE);  Surgeon: Wilbert JONELLE Bihari, MD;  Location: [facility] ENDOSCOPY;  Service: Cardiovascular;  Laterality: N/A;   Social History   Occupational History   Occupation: Disabled  Tobacco Use   Smoking status: Every Day    Current packs/day: 0.50    Average packs/day: 0.5 packs/day for 44.5 years (22.3 ttl pk-yrs)    Types: Cigarettes    Start date: 69   Smokeless tobacco: Current    Types: Chew   Tobacco comments:    6 cigs per day  Vaping Use   Vaping status: Never Used  Substance and Sexual Activity   Alcohol use: No    Alcohol/week: 0.0 standard drinks of alcohol   Drug use: No   Sexual activity: Not Currently    Birth control/protection: Surgical

## 2024-07-12 ENCOUNTER — Other Ambulatory Visit: Payer: Self-pay | Admitting: *Deleted

## 2024-07-12 DIAGNOSIS — Z79899 Other long term (current) drug therapy: Secondary | ICD-10-CM | POA: Diagnosis not present

## 2024-07-12 DIAGNOSIS — Z95828 Presence of other vascular implants and grafts: Secondary | ICD-10-CM

## 2024-07-12 DIAGNOSIS — I739 Peripheral vascular disease, unspecified: Secondary | ICD-10-CM

## 2024-07-12 DIAGNOSIS — I779 Disorder of arteries and arterioles, unspecified: Secondary | ICD-10-CM

## 2024-07-14 MED ORDER — TRIAMCINOLONE ACETONIDE 40 MG/ML IJ SUSP
40.0000 mg | INTRAMUSCULAR | Status: AC | PRN
  Administered 2024-07-11: 40 mg via INTRA_ARTICULAR

## 2024-07-14 MED ORDER — BUPIVACAINE HCL 0.5 % IJ SOLN
9.0000 mL | INTRAMUSCULAR | Status: AC | PRN
  Administered 2024-07-11: 9 mL via INTRA_ARTICULAR

## 2024-07-14 MED ORDER — LIDOCAINE HCL 1 % IJ SOLN
5.0000 mL | INTRAMUSCULAR | Status: AC | PRN
  Administered 2024-07-11: 5 mL

## 2024-07-16 ENCOUNTER — Ambulatory Visit (INDEPENDENT_AMBULATORY_CARE_PROVIDER_SITE_OTHER): Admitting: Podiatry

## 2024-07-16 ENCOUNTER — Encounter: Payer: Self-pay | Admitting: Podiatry

## 2024-07-16 DIAGNOSIS — M79675 Pain in left toe(s): Secondary | ICD-10-CM

## 2024-07-16 DIAGNOSIS — M79674 Pain in right toe(s): Secondary | ICD-10-CM

## 2024-07-16 DIAGNOSIS — B351 Tinea unguium: Secondary | ICD-10-CM | POA: Diagnosis not present

## 2024-07-16 DIAGNOSIS — L84 Corns and callosities: Secondary | ICD-10-CM

## 2024-07-16 DIAGNOSIS — E1151 Type 2 diabetes mellitus with diabetic peripheral angiopathy without gangrene: Secondary | ICD-10-CM | POA: Diagnosis not present

## 2024-07-16 NOTE — Progress Notes (Signed)
This patient returns to my office for at risk foot care.  This patient requires this care by a professional since this patient will be at risk due to having diabetes.  This patient is unable to cut nails himself since the patient cannot reach his nails.These nails are painful walking and wearing shoes.  This patient presents for at risk foot care today.  General Appearance  Alert, conversant and in no acute stress.  Vascular  Dorsalis pedis and posterior tibial  pulses are palpable  bilaterally.  Capillary return is within normal limits  bilaterally. Temperature is within normal limits  bilaterally.  Neurologic  Senn-Weinstein monofilament wire test within normal limits  bilaterally. Muscle power within normal limits bilaterally.  Nails Thick disfigured discolored nails with subungual debris  from hallux to fifth toes bilaterally. No evidence of bacterial infection or drainage bilaterally.  Orthopedic  No limitations of motion  feet .  No crepitus or effusions noted.  No bony pathology or digital deformities noted.  Skin  normotropic skin noted bilaterally.  No signs of infections or ulcers noted.   Callus sub 2 right foot.  Onychomycosis  Pain in right toes  Pain in left toes   Porokeratosis right forefoot.  Consent was obtained for treatment procedures.   Mechanical debridement of nails 1-5  bilaterally performed with a nail nipper.  Filed with dremel without incident. Debride porokeratosis with # 15 blade and dremel tool.   Return office visit    3 months                  Told patient to return for periodic foot care and evaluation due to potential at risk complications.   Gardiner Barefoot DPM

## 2024-07-26 ENCOUNTER — Ambulatory Visit (HOSPITAL_BASED_OUTPATIENT_CLINIC_OR_DEPARTMENT_OTHER)
Admission: RE | Admit: 2024-07-26 | Discharge: 2024-07-26 | Disposition: A | Discharge status: Home / Self Care | Source: Ambulatory Visit | Attending: Physician Assistant | Admitting: Physician Assistant

## 2024-07-26 ENCOUNTER — Ambulatory Visit: Payer: Self-pay | Admitting: Physician Assistant

## 2024-07-26 ENCOUNTER — Ambulatory Visit (HOSPITAL_COMMUNITY)
Admission: RE | Admit: 2024-07-26 | Discharge: 2024-07-26 | Disposition: A | Discharge status: Home / Self Care | Source: Ambulatory Visit | Attending: Physician Assistant | Admitting: Physician Assistant

## 2024-07-26 DIAGNOSIS — Z95828 Presence of other vascular implants and grafts: Secondary | ICD-10-CM

## 2024-07-26 DIAGNOSIS — I779 Disorder of arteries and arterioles, unspecified: Secondary | ICD-10-CM

## 2024-07-26 DIAGNOSIS — I739 Peripheral vascular disease, unspecified: Secondary | ICD-10-CM | POA: Insufficient documentation

## 2024-07-26 DIAGNOSIS — I6529 Occlusion and stenosis of unspecified carotid artery: Secondary | ICD-10-CM | POA: Diagnosis not present

## 2024-07-26 LAB — VAS US ABI WITH/WO TBI
Left ABI: 1.07
Right ABI: 1.09

## 2024-07-30 ENCOUNTER — Ambulatory Visit: Attending: Surgery | Admitting: Physician Assistant

## 2024-07-30 VITALS — BP 158/93 | HR 94 | Temp 97.9°F | Wt 165.6 lb

## 2024-07-30 DIAGNOSIS — I739 Peripheral vascular disease, unspecified: Secondary | ICD-10-CM | POA: Diagnosis not present

## 2024-07-30 DIAGNOSIS — M25551 Pain in right hip: Secondary | ICD-10-CM

## 2024-07-30 NOTE — Progress Notes (Signed)
 Office Note     CC:  follow up Requesting Provider:  Shawn Sick, MD  HPI: Susan Holmes is a 68 y.o. (Aug 10, 1956) female who presents for follow up of PAD. She returns with complaints of right hip pain. This has been on going for a couple years now. She explains that this is worse at night time. She states that she cannot lay on her right hip due to the pain. Occasionally discomfort on walking. Some radiation down right leg and into right lower back. She says at night she has to sit up or roll onto her left side. She denies any pain in right lower leg or foot. No pain in LLE. She says she has not seen Ortho for evaluation of this. She has discussed with PCP. She has not classic rest pain symptoms. No claudication or tissue loss. She is medically managed on Aspirin  and statin. She smokes 1/2 ppd  She has history of left femoral to above-the-knee popliteal bypass with vein by Dr. Harvey in October 2020.  Prior to that she underwent stenting of left external iliac artery in September 2020.   Past Medical History:  Diagnosis Date   Anxiety    Arthritis    Callus of foot 09/19/2018   Chest pain 09/26/2018   Claudication (HCC) 09/14/2019   Congenital blindness    Left  eye   CONGENITAL BLINDNESS, LEFT EYE 06/12/2007   Qualifier: Diagnosis of   By: Drinkard MSN, FNP-C, Sue         Coronary artery disease    per cardiac cath 2015-- mild disease involving LAD and branches ,  LCFx and branches, and mild disease pRCA   Cough 07/08/2016   Depression    Diabetes mellitus, type II (HCC)    followed by pcp   Erosive gastropathy 12/31/2020   She also had an upper GI endoscopy on 09/28/2013 which revealed a normal esophagus without masses or strictures, esophagitis, eosinophilic esophagitis. There was a few dispersed small erosions found in the prepyloric region of the stomach.      Glaucoma    Glaucoma, both eyes    Hepatitis B infection pt unsure   resolved   History of colonic polyps  05/17/2022   IMO SNOMED Dx Update Oct 2024     History of kidney stones    right stone   History of transient ischemic attack (TIA) 2017   per pt no residuals   History of vitamin D  deficiency 09/06/2017   HTN (hypertension)    followed by pcp   Hyperlipidemia    Hyperparathyroidism (HCC) 03/08/2013   Appears to be diagnosed around 02/2013:  PTH 109.9  Vit d 25-OH 02/2013: 19  Dexa 02/2013: Patient's diagnostic category is NORMAL by WHO Criteria. FRACTURE RISK: NOT INCREASED FRAX: Not calculated due to a T score at or above -1.0.    Serum calcium  initially 11.8  Ionized calcium  elevated 1.45        Liver hemangioma 04/04/2014   Lumbar spondylosis 06/05/2021   Osteoarthritis 12/11/2014   PAD (peripheral artery disease) (HCC)    Paronychia of great toe, left 06/21/2019   Poor dental hygiene    Stroke (HCC)    Tobacco abuse    .5 PPD smoker   Uncontrolled type II diabetes mellitus 06/12/2007   Qualifier: Diagnosis of  By: Drinkard MSN, FNP-C, Beverley      Past Surgical History:  Procedure Laterality Date   ABDOMINAL AORTOGRAM W/LOWER EXTREMITY N/A 09/14/2019   Procedure: ABDOMINAL AORTOGRAM  W/LOWER EXTREMITY;  Surgeon: Harvey Carlin BRAVO, MD;  Location: [facility] INVASIVE CV LAB;  Service: Cardiovascular;  Laterality: N/A;   ABDOMINAL HYSTERECTOMY  1980s   unsure if ovaries were taken   BYPASS GRAFT Left 10/22/2019    Left femoral to above-knee popliteal bypass using reversed ipsilateral greater saphenous vein   colonoscopy     FEMORAL-POPLITEAL BYPASS GRAFT Left 10/22/2019   Procedure: LEFT FEMORAL-ABOVE KNEE POPLITEAL  BYPASS GRAFT with REVERSED GREATER SAPHENOUS VEIN;  Surgeon: Harvey Carlin BRAVO, MD;  Location: [facility] OR;  Service: Vascular;  Laterality: Left;   GLAUCOMA SURGERY Right 2017   IR URETERAL STENT RIGHT NEW ACCESS W/O SEP NEPHROSTOMY CATH  10/03/2018   LEFT HEART CATHETERIZATION WITH CORONARY ANGIOGRAM N/A 10/09/2014   Procedure: LEFT HEART CATHETERIZATION WITH CORONARY ANGIOGRAM;   Surgeon: Debby DELENA Sor, MD;  Location: [facility] CATH LAB;  Service: Cardiovascular;  Laterality: N/A;   NEPHROLITHOTOMY Right 10/03/2018   Procedure: RIGHT NEPHROLITHOTOMY PERCUTANEOUS FIRST STAGE;  Surgeon: Watt Rush, MD;  Location: [facility] ORS;  Service: Urology;  Laterality: Right;   PERIPHERAL VASCULAR INTERVENTION Left 09/14/2019   Procedure: PERIPHERAL VASCULAR INTERVENTION;  Surgeon: Harvey Carlin BRAVO, MD;  Location: [facility] INVASIVE CV LAB;  Service: Cardiovascular;  Laterality: Left;   left external iliac stent    TEE WITHOUT CARDIOVERSION N/A 08/17/2016   Procedure: TRANSESOPHAGEAL ECHOCARDIOGRAM (TEE);  Surgeon: Wilbert JONELLE Bihari, MD;  Location: [facility] ENDOSCOPY;  Service: Cardiovascular;  Laterality: N/A;    Social History   Socioeconomic History   Marital status: Divorced    Spouse name: Not on file   Number of children: 1   Years of education: 12   Highest education level: Not on file  Occupational History   Occupation: Disabled  Tobacco Use   Smoking status: Every Day    Current packs/day: 0.50    Average packs/day: 0.5 packs/day for 44.6 years (22.3 ttl pk-yrs)    Types: Cigarettes    Start date: 54   Smokeless tobacco: Current    Types: Chew   Tobacco comments:    6 cigs per day  Vaping Use   Vaping status: Never Used  Substance and Sexual Activity   Alcohol use: No    Alcohol/week: 0.0 standard drinks of alcohol   Drug use: No   Sexual activity: Not Currently    Birth control/protection: Surgical  Other Topics Concern   Not on file  Social History Narrative   Current Social History 08/18/2020        Patient lives alone in a ground floor apartment which is 1 story. There are 13 steps with handrail up to the entrance the patient uses.       Patient's method of transportation is [facility] transportation.      The highest level of education was 2 years college.      The patient currently disabled.      Identified important Relationships are My daughter, brothers, grands and  great-grands.       Pets : chihuahua Catering manager)       Interests / Fun: I don't       Current Stressors: Nothing       Religious / Personal Beliefs: '[employer]       Other: I am a very loving person      L. Ducatte, BSN, RN-BC        Social Drivers of Health   Financial Resource Strain: Low Risk  (03/21/2024)   Overall Financial Resource Strain (CARDIA)  Difficulty of Paying Living Expenses: Not hard at all  Food Insecurity: No Food Insecurity (03/21/2024)   Hunger Vital Sign    Worried About Running Out of Food in the Last Year: Never true    Ran Out of Food in the Last Year: Never true  Transportation Needs: No Transportation Needs (03/21/2024)   PRAPARE - Administrator, Civil Service (Medical): No    Lack of Transportation (Non-Medical): No  Physical Activity: Inactive (03/21/2024)   Exercise Vital Sign    Days of Exercise per Week: 0 days    Minutes of Exercise per Session: 0 min  Stress: No Stress Concern Present (03/21/2024)   [employer] of Occupational Health - Occupational Stress Questionnaire    Feeling of Stress : Not at all  Social Connections: Moderately Integrated (03/21/2024)   Social Connection and Isolation Panel    Frequency of Communication with Friends and Family: More than three times a week    Frequency of Social Gatherings with Friends and Family: Once a week    Attends Religious Services: More than 4 times per year    Active Member of [employer] or Organizations: No    Attends Engineer, structural: More than 4 times per year    Marital Status: Divorced  Catering manager Violence: Not At Risk (03/21/2024)   Humiliation, Afraid, Rape, and Kick questionnaire    Fear of Current or Ex-Partner: No    Emotionally Abused: No    Physically Abused: No    Sexually Abused: No    Family History  Problem Relation Age of Onset   Heart disease Mother 74   Hypertension Mother    CVA Mother    Heart disease Father 89   CVA Sister     Heart disease Sister    Aneurysm Sister    CVA Brother    Aneurysm Brother    CVA Maternal Grandmother    CVA Maternal Grandfather    Hypertension Brother    Colon cancer Neg Hx    Esophageal cancer Neg Hx    Stomach cancer Neg Hx     Current Outpatient Medications  Medication Sig Dispense Refill   amLODipine  (NORVASC ) 10 MG tablet Take 1 tablet (10 mg total) by mouth daily. 90 tablet 3   aspirin  EC 81 MG tablet Take 1 tablet (81 mg total) by mouth daily. 90 tablet 1   baclofen (LIORESAL) 10 MG tablet Take 10 mg by mouth 3 (three) times daily as needed.     Blood Glucose Monitoring Suppl (CONTOUR PLUS BLUE) w/Device KIT 1 each by Does not apply route 3 (three) times daily. 1 kit 3   Blood Glucose Monitoring Suppl (ONETOUCH VERIO) w/Device KIT Use as directed 1 kit 0   buprenorphine (BUTRANS) 10 MCG/HR PTWK 1 patch once a week.     celecoxib  (CELEBREX ) 100 MG capsule Take 100 mg by mouth 2 (two) times daily.     diclofenac  Sodium (VOLTAREN ) 1 % GEL Apply 4 g topically 3 (three) times daily as needed (pain). 150 g 2   Dulaglutide  (TRULICITY ) 4.5 MG/0.5ML SOAJ Inject 4.5 mg as directed once a week. 6 mL 3   glucose blood (ONETOUCH VERIO) test strip Use as instructed 100 each 12   glucose blood test strip Use as instructed 100 each 12   hydrOXYzine  (ATARAX ) 25 MG tablet Take 25 mg by mouth 3 (three) times daily as needed.     insulin  glargine (LANTUS  SOLOSTAR) 100 UNIT/ML Solostar Pen Inject  44 Units into the skin at bedtime. 15 mL 3   insulin  lispro (HUMALOG  KWIKPEN) 100 UNIT/ML KwikPen Inject 6 Units into the skin 2 (two) times daily with a meal. 15 mL 1   Insulin  Pen Needle 32G X 4 MM MISC Use to inject insulin  4 times a day 360 each 3   irbesartan  (AVAPRO ) 300 MG tablet Take 1 tablet (300 mg total) by mouth daily. 90 tablet 1   lamoTRIgine (LAMICTAL) 25 MG tablet Take 50 mg by mouth daily.     lidocaine  (LIDODERM ) 5 % 1 Patch(s) Topical     LINZESS 290 MCG CAPS capsule Take 290 mcg by  mouth daily as needed.     metFORMIN  (GLUCOPHAGE  XR) 500 MG 24 hr tablet Take 1 tablet (500 mg total) by mouth 2 (two) times daily with a meal. 180 tablet 3   mirtazapine  (REMERON ) 15 MG tablet Take 1 tablet (15 mg total) by mouth at bedtime. 90 tablet 0   NARCAN 4 MG/0.1ML LIQD nasal spray kit Place 1 spray into the nose once.     OLANZapine  (ZYPREXA ) 5 MG tablet Take 1 tablet (5 mg total) by mouth at bedtime.     OneTouch Delica Lancets 33G MISC Use with one touch Delica lancing device and Accu chek Guideme meter and Accu Chek guide strips to check your blood sugar up to 3 times a day 100 each 11   oxyCODONE  (ROXICODONE ) 15 MG immediate release tablet Take 15 mg by mouth every other day as needed for pain.      pantoprazole  (PROTONIX ) 40 MG tablet Take 40 mg by mouth daily. (Patient not taking: Reported on 07/11/2024)     prednisoLONE  acetate (PRED FORTE ) 1 % ophthalmic suspension Apply to eye.     ramelteon (ROZEREM) 8 MG tablet Take 8 mg by mouth at bedtime.     rosuvastatin  (CRESTOR ) 20 MG tablet TAKE 1 TABLET(20 MG) BY MOUTH AT BEDTIME 90 tablet 0   rosuvastatin  (CRESTOR ) 20 MG tablet TAKE 1 TABLET(20 MG) BY MOUTH AT BEDTIME 15 tablet 0   traZODone  (DESYREL ) 100 MG tablet Take 300 mg by mouth at bedtime.     venlafaxine XR (EFFEXOR-XR) 75 MG 24 hr capsule Take 75 mg by mouth every morning.     Vitamin D , Ergocalciferol , (DRISDOL ) 1.25 MG (50000 UNIT) CAPS capsule Take 50,000 Units by mouth once a week.     No current facility-administered medications for this visit.    Allergies  Allergen Reactions   Ace Inhibitors Cough   Lipitor  [Atorvastatin ] Other (See Comments)    MYALGIA    Chantix  [Varenicline ] Nausea And Vomiting     REVIEW OF SYSTEMS:  Negative unless noted in HPI [X]  denotes positive finding, [ ]  denotes negative finding Cardiac  Comments:  Chest pain or chest pressure:    Shortness of breath upon exertion:    Short of breath when lying flat:    Irregular heart  rhythm:        Vascular    Pain in calf, thigh, or hip brought on by ambulation:    Pain in feet at night that wakes you up from your sleep:     Blood clot in your veins:    Leg swelling:         Pulmonary    Oxygen  at home:    Productive cough:     Wheezing:         Neurologic    Sudden weakness in arms or legs:  Sudden numbness in arms or legs:     Sudden onset of difficulty speaking or slurred speech:    Temporary loss of vision in one eye:     Problems with dizziness:         Gastrointestinal    Blood in stool:     Vomited blood:         Genitourinary    Burning when urinating:     Blood in urine:        Psychiatric    Major depression:         Hematologic    Bleeding problems:    Problems with blood clotting too easily:        Skin    Rashes or ulcers:        Constitutional    Fever or chills:      PHYSICAL EXAMINATION:  Vitals:   07/30/24 0859  BP: (!) 158/93  Pulse: 94  Temp: 97.9 F (36.6 C)  TempSrc: Temporal  Weight: 165 lb 9.6 oz (75.1 kg)    General:  WDWN in NAD; vital signs documented above Gait: shuffling HENT: WNL, normocephalic Pulmonary: normal non-labored breathing Cardiac: regular HR Abdomen: soft Vascular Exam/Pulses:2+ radial,  2+ femoral, 2+ DP and PT pulses bilaterally Extremities: without ischemic changes, without Gangrene , without cellulitis; without open wounds;  Musculoskeletal: no muscle wasting or atrophy  Neurologic: A&O X 3 Psychiatric:  The pt has Normal affect.   Non-Invasive Vascular Imaging:   VAS US  Carotid Duplex: Summary:  Right Carotid: Velocities in the right ICA are consistent with a 1-39% stenosis.   Left Carotid: Velocities in the left ICA are consistent with a 1-39% stenosis. The ECA appears >50% stenosed.   Vertebrals:  Bilateral vertebral arteries demonstrate antegrade flow.  Subclavians: Normal flow hemodynamics were seen in bilateral subclavian arteries.   VAS US  Lower Extremity Bypass  graft duplex: Summary:  Left: No significant change as compared to previous study.   Widely patent distal common femoral artery to above-the-knee popliteal artery without evidence of stenosis.   +-------+-----------+-----------+------------+------------+  ABI/TBIToday's ABIToday's TBIPrevious ABIPrevious TBI  +-------+-----------+-----------+------------+------------+  Right 1.09       1.09       1.07        .72           +-------+-----------+-----------+------------+------------+  Left  1.07       1.07       1.09        .67           +-------+-----------+-----------+------------+------------+   Bilateral ABIs and right TBIs appear essentially unchanged compared to prior study on 11/04/2023. Left TBIs appear increased compared to prior study on 11/04/2023.     ASSESSMENT/PLAN:: 68 y.o. female here for follow up for PAD. Continues to have right hip pain. On exam she has normal pulses throughout BLE. Her non invasive studies shows normal ABI's bilaterally. LLE bypass graft is patent on duplex. She is without classic rest pain or claudication. No tissue loss -Her surveillance carotid duplex shows patent ICA's bilaterally, normal flow in the vertebral and subclavian arteries. - continue Aspirin  and statin - Will make referral to Orthopedics for evaluation of her right hip pain  - Encourage smoking cessation - She can follow up in 1 year with repeat ABI and LLE bypass graft duplex   Teretha Damme, PA-C [facility] [phone]  On Call MD:   [place]

## 2024-08-01 DIAGNOSIS — H04123 Dry eye syndrome of bilateral lacrimal glands: Secondary | ICD-10-CM | POA: Diagnosis not present

## 2024-08-01 DIAGNOSIS — E119 Type 2 diabetes mellitus without complications: Secondary | ICD-10-CM | POA: Diagnosis not present

## 2024-08-01 DIAGNOSIS — H401122 Primary open-angle glaucoma, left eye, moderate stage: Secondary | ICD-10-CM | POA: Diagnosis not present

## 2024-08-01 DIAGNOSIS — H401113 Primary open-angle glaucoma, right eye, severe stage: Secondary | ICD-10-CM | POA: Diagnosis not present

## 2024-08-01 DIAGNOSIS — H3581 Retinal edema: Secondary | ICD-10-CM | POA: Diagnosis not present

## 2024-08-02 DIAGNOSIS — Z9181 History of falling: Secondary | ICD-10-CM | POA: Diagnosis not present

## 2024-08-02 DIAGNOSIS — M8580 Other specified disorders of bone density and structure, unspecified site: Secondary | ICD-10-CM | POA: Diagnosis not present

## 2024-08-02 DIAGNOSIS — M25512 Pain in left shoulder: Secondary | ICD-10-CM | POA: Diagnosis not present

## 2024-08-02 DIAGNOSIS — M5416 Radiculopathy, lumbar region: Secondary | ICD-10-CM | POA: Diagnosis not present

## 2024-08-02 DIAGNOSIS — E1140 Type 2 diabetes mellitus with diabetic neuropathy, unspecified: Secondary | ICD-10-CM | POA: Diagnosis not present

## 2024-08-02 DIAGNOSIS — E559 Vitamin D deficiency, unspecified: Secondary | ICD-10-CM | POA: Diagnosis not present

## 2024-08-02 DIAGNOSIS — Z794 Long term (current) use of insulin: Secondary | ICD-10-CM | POA: Diagnosis not present

## 2024-08-02 DIAGNOSIS — G8929 Other chronic pain: Secondary | ICD-10-CM | POA: Diagnosis not present

## 2024-08-02 DIAGNOSIS — Z79899 Other long term (current) drug therapy: Secondary | ICD-10-CM | POA: Diagnosis not present

## 2024-08-02 DIAGNOSIS — K5909 Other constipation: Secondary | ICD-10-CM | POA: Diagnosis not present

## 2024-08-02 DIAGNOSIS — F17210 Nicotine dependence, cigarettes, uncomplicated: Secondary | ICD-10-CM | POA: Diagnosis not present

## 2024-08-03 ENCOUNTER — Ambulatory Visit (HOSPITAL_COMMUNITY)

## 2024-08-06 DIAGNOSIS — Z79899 Other long term (current) drug therapy: Secondary | ICD-10-CM | POA: Diagnosis not present

## 2024-08-07 ENCOUNTER — Other Ambulatory Visit: Payer: Self-pay | Admitting: Physician Assistant

## 2024-08-07 DIAGNOSIS — I10 Essential (primary) hypertension: Secondary | ICD-10-CM

## 2024-08-08 DIAGNOSIS — H33191 Other retinoschisis and retinal cysts, right eye: Secondary | ICD-10-CM | POA: Diagnosis not present

## 2024-08-08 DIAGNOSIS — H35351 Cystoid macular degeneration, right eye: Secondary | ICD-10-CM | POA: Diagnosis not present

## 2024-08-08 DIAGNOSIS — H401122 Primary open-angle glaucoma, left eye, moderate stage: Secondary | ICD-10-CM | POA: Diagnosis not present

## 2024-08-08 DIAGNOSIS — H401113 Primary open-angle glaucoma, right eye, severe stage: Secondary | ICD-10-CM | POA: Diagnosis not present

## 2024-08-08 DIAGNOSIS — E119 Type 2 diabetes mellitus without complications: Secondary | ICD-10-CM | POA: Diagnosis not present

## 2024-08-08 DIAGNOSIS — H30041 Focal chorioretinal inflammation, macular or paramacular, right eye: Secondary | ICD-10-CM | POA: Diagnosis not present

## 2024-08-16 ENCOUNTER — Other Ambulatory Visit: Payer: Self-pay | Admitting: Physician Assistant

## 2024-08-16 ENCOUNTER — Ambulatory Visit (HOSPITAL_COMMUNITY)
Admission: RE | Admit: 2024-08-16 | Discharge: 2024-08-16 | Disposition: A | Discharge status: Home / Self Care | Source: Ambulatory Visit | Attending: Internal Medicine | Admitting: Internal Medicine

## 2024-08-16 DIAGNOSIS — F17210 Nicotine dependence, cigarettes, uncomplicated: Secondary | ICD-10-CM | POA: Insufficient documentation

## 2024-08-16 DIAGNOSIS — I2510 Atherosclerotic heart disease of native coronary artery without angina pectoris: Secondary | ICD-10-CM | POA: Insufficient documentation

## 2024-08-16 DIAGNOSIS — I700 Atherosclerosis of aorta: Secondary | ICD-10-CM | POA: Insufficient documentation

## 2024-08-16 DIAGNOSIS — I10 Essential (primary) hypertension: Secondary | ICD-10-CM

## 2024-08-16 DIAGNOSIS — Z122 Encounter for screening for malignant neoplasm of respiratory organs: Secondary | ICD-10-CM | POA: Diagnosis not present

## 2024-08-16 DIAGNOSIS — J439 Emphysema, unspecified: Secondary | ICD-10-CM | POA: Insufficient documentation

## 2024-08-20 MED ORDER — ROSUVASTATIN CALCIUM 20 MG PO TABS: 20.0000 mg | ORAL_TABLET | Freq: Every day | ORAL | 3 refills | Status: DC

## 2024-08-22 ENCOUNTER — Ambulatory Visit: Admitting: Orthopedic Surgery

## 2024-09-03 ENCOUNTER — Other Ambulatory Visit: Payer: Self-pay

## 2024-09-03 DIAGNOSIS — G8929 Other chronic pain: Secondary | ICD-10-CM | POA: Diagnosis not present

## 2024-09-03 DIAGNOSIS — G894 Chronic pain syndrome: Secondary | ICD-10-CM | POA: Diagnosis not present

## 2024-09-03 DIAGNOSIS — M62830 Muscle spasm of back: Secondary | ICD-10-CM | POA: Diagnosis not present

## 2024-09-03 DIAGNOSIS — Z86718 Personal history of other venous thrombosis and embolism: Secondary | ICD-10-CM | POA: Diagnosis not present

## 2024-09-03 DIAGNOSIS — M546 Pain in thoracic spine: Secondary | ICD-10-CM | POA: Diagnosis not present

## 2024-09-03 DIAGNOSIS — M542 Cervicalgia: Secondary | ICD-10-CM | POA: Diagnosis not present

## 2024-09-03 DIAGNOSIS — M11262 Other chondrocalcinosis, left knee: Secondary | ICD-10-CM | POA: Diagnosis not present

## 2024-09-03 DIAGNOSIS — M25511 Pain in right shoulder: Secondary | ICD-10-CM | POA: Diagnosis not present

## 2024-09-03 DIAGNOSIS — M545 Low back pain, unspecified: Secondary | ICD-10-CM | POA: Diagnosis not present

## 2024-09-03 DIAGNOSIS — M25562 Pain in left knee: Secondary | ICD-10-CM | POA: Diagnosis not present

## 2024-09-03 MED ORDER — MIRTAZAPINE 15 MG PO TABS: 15.0000 mg | ORAL_TABLET | Freq: Every day | ORAL | 0 refills | Status: DC

## 2024-09-06 ENCOUNTER — Encounter: Admitting: Student

## 2024-09-19 ENCOUNTER — Ambulatory Visit

## 2024-10-01 DIAGNOSIS — M5416 Radiculopathy, lumbar region: Secondary | ICD-10-CM | POA: Diagnosis not present

## 2024-10-01 DIAGNOSIS — G8929 Other chronic pain: Secondary | ICD-10-CM | POA: Diagnosis not present

## 2024-10-01 DIAGNOSIS — Z79899 Other long term (current) drug therapy: Secondary | ICD-10-CM | POA: Diagnosis not present

## 2024-10-01 DIAGNOSIS — E1140 Type 2 diabetes mellitus with diabetic neuropathy, unspecified: Secondary | ICD-10-CM | POA: Diagnosis not present

## 2024-10-01 DIAGNOSIS — M8580 Other specified disorders of bone density and structure, unspecified site: Secondary | ICD-10-CM | POA: Diagnosis not present

## 2024-10-01 DIAGNOSIS — M25512 Pain in left shoulder: Secondary | ICD-10-CM | POA: Diagnosis not present

## 2024-10-01 DIAGNOSIS — K5909 Other constipation: Secondary | ICD-10-CM | POA: Diagnosis not present

## 2024-10-01 DIAGNOSIS — E559 Vitamin D deficiency, unspecified: Secondary | ICD-10-CM | POA: Diagnosis not present

## 2024-10-01 DIAGNOSIS — Z9181 History of falling: Secondary | ICD-10-CM | POA: Diagnosis not present

## 2024-10-01 DIAGNOSIS — F17210 Nicotine dependence, cigarettes, uncomplicated: Secondary | ICD-10-CM | POA: Diagnosis not present

## 2024-10-01 DIAGNOSIS — Z794 Long term (current) use of insulin: Secondary | ICD-10-CM | POA: Diagnosis not present

## 2024-10-03 DIAGNOSIS — Z79899 Other long term (current) drug therapy: Secondary | ICD-10-CM | POA: Diagnosis not present

## 2024-10-11 ENCOUNTER — Other Ambulatory Visit: Payer: Self-pay

## 2024-10-14 MED ORDER — DICLOFENAC SODIUM 1 % EX GEL: 4.0000 g | Freq: Three times a day (TID) | CUTANEOUS | 2 refills | Status: AC | PRN

## 2024-10-15 ENCOUNTER — Ambulatory Visit: Admitting: Podiatry

## 2024-10-15 ENCOUNTER — Encounter: Payer: Self-pay | Admitting: Podiatry

## 2024-10-15 DIAGNOSIS — L84 Corns and callosities: Secondary | ICD-10-CM

## 2024-10-15 DIAGNOSIS — E1151 Type 2 diabetes mellitus with diabetic peripheral angiopathy without gangrene: Secondary | ICD-10-CM

## 2024-10-15 DIAGNOSIS — B351 Tinea unguium: Secondary | ICD-10-CM

## 2024-10-15 DIAGNOSIS — M79674 Pain in right toe(s): Secondary | ICD-10-CM | POA: Diagnosis not present

## 2024-10-15 DIAGNOSIS — M79675 Pain in left toe(s): Secondary | ICD-10-CM | POA: Diagnosis not present

## 2024-10-15 NOTE — Progress Notes (Signed)
 This patient returns to my office for at risk foot care.  This patient requires this care by a professional since this patient will be at risk due to having diabetes.  This patient is unable to cut nails himself since the patient cannot reach his nails.These nails are painful walking and wearing shoes.  This patient presents for at risk foot care today.  General Appearance  Alert, conversant and in no acute stress.  Vascular  Dorsalis pedis and posterior tibial  pulses are palpable  bilaterally.  Capillary return is within normal limits  bilaterally. Temperature is within normal limits  bilaterally.  Neurologic  Senn-Weinstein monofilament wire test within normal limits  bilaterally. Muscle power within normal limits bilaterally.  Nails Thick disfigured discolored nails with subungual debris  hallux nails  bilaterally. No evidence of bacterial infection or drainage bilaterally.  Orthopedic  No limitations of motion  feet .  No crepitus or effusions noted.  No bony pathology or digital deformities noted.  Skin  normotropic skin noted bilaterally.  No signs of infections or ulcers noted.   Callus sub 2 right foot.  Onychomycosis  Pain in right toes  Pain in left toes   Porokeratosis right forefoot.  Consent was obtained for treatment procedures.   Mechanical debridement of nails 1-5  bilaterally performed with a nail nipper.  Filed with dremel without incident. Debride porokeratosis with # 15 blade and dremel tool.   Return office visit    3 months                  Told patient to return for periodic foot care and evaluation due to potential at risk complications.   Cordella Bold DPM

## 2024-10-19 ENCOUNTER — Other Ambulatory Visit: Payer: Self-pay

## 2024-10-19 DIAGNOSIS — E119 Type 2 diabetes mellitus without complications: Secondary | ICD-10-CM

## 2024-10-19 MED ORDER — LANTUS SOLOSTAR 100 UNIT/ML ~~LOC~~ SOPN
44.0000 [IU] | PEN_INJECTOR | Freq: Every day | SUBCUTANEOUS | 3 refills | Status: DC
Start: 2024-10-19 — End: 2024-10-31

## 2024-10-19 NOTE — Telephone Encounter (Signed)
 Medication sent to pharmacy

## 2024-10-22 ENCOUNTER — Other Ambulatory Visit: Payer: Self-pay

## 2024-10-22 ENCOUNTER — Ambulatory Visit (HOSPITAL_COMMUNITY): Admission: EM | Admit: 2024-10-22 | Discharge: 2024-10-22 | Disposition: A | Discharge status: Home / Self Care

## 2024-10-22 ENCOUNTER — Encounter (HOSPITAL_COMMUNITY): Payer: Self-pay | Admitting: *Deleted

## 2024-10-22 DIAGNOSIS — H6122 Impacted cerumen, left ear: Secondary | ICD-10-CM

## 2024-10-22 NOTE — ED Triage Notes (Signed)
 PT reports for past 3 weeks  she had not been able to hear out of Lt ear.

## 2024-10-22 NOTE — ED Provider Notes (Signed)
[facility]    CSN: [id number] Arrival date & time: 10/22/24  9074      History   Chief Complaint Chief Complaint  Patient presents with   Ear Fullness    HPI Susan Holmes is a 68 y.o. female.   Pt presents today due to aural fullness in left ear for the past 3 weeks. Pt states that she is not experiencing pain in left ear just unable to hear. Pt states that she attempted to use ear drops and qtips with no relief.   The history is provided by the patient.  Ear Fullness    Past Medical History:  Diagnosis Date   Anxiety    Arthritis    Callus of foot 09/19/2018   Chest pain 09/26/2018   Claudication 09/14/2019   Congenital blindness    Left  eye   CONGENITAL BLINDNESS, LEFT EYE 06/12/2007   Qualifier: Diagnosis of   By: Drinkard MSN, FNP-C, Sue         Coronary artery disease    per cardiac cath 2015-- mild disease involving LAD and branches ,  LCFx and branches, and mild disease pRCA   Cough 07/08/2016   Depression    Diabetes mellitus, type II (HCC)    followed by pcp   Erosive gastropathy 12/31/2020   She also had an upper GI endoscopy on 09/28/2013 which revealed a normal esophagus without masses or strictures, esophagitis, eosinophilic esophagitis. There was a few dispersed small erosions found in the prepyloric region of the stomach.      Glaucoma    Glaucoma, both eyes    Hepatitis B infection pt unsure   resolved   History of colonic polyps 05/17/2022   IMO SNOMED Dx Update Oct 2024     History of kidney stones    right stone   History of transient ischemic attack (TIA) 2017   per pt no residuals   History of vitamin D  deficiency 09/06/2017   HTN (hypertension)    followed by pcp   Hyperlipidemia    Hyperparathyroidism 03/08/2013   Appears to be diagnosed around 02/2013:  PTH 109.9  Vit d 25-OH 02/2013: 19  Dexa 02/2013: Patient's diagnostic category is NORMAL by WHO Criteria. FRACTURE RISK: NOT INCREASED FRAX: Not calculated due to a T  score at or above -1.0.    Serum calcium  initially 11.8  Ionized calcium  elevated 1.45        Liver hemangioma 04/04/2014   Lumbar spondylosis 06/05/2021   Osteoarthritis 12/11/2014   PAD (peripheral artery disease)    Paronychia of great toe, left 06/21/2019   Poor dental hygiene    Stroke (HCC)    Tobacco abuse    .5 PPD smoker   Uncontrolled type II diabetes mellitus 06/12/2007   Qualifier: Diagnosis of  By: Drinkard MSN, FNP-C, Sue      Patient Active Problem List   Diagnosis Date Noted   Insomnia 06/06/2024   Rotator cuff impingement syndrome 11/09/2023   Coronary artery disease 05/03/2023   Delayed gastric emptying 12/31/2020   Sensorineural hearing loss (SNHL), bilateral 10/23/2020   Polypharmacy 09/09/2020   Peripheral arterial disease 10/22/2019   Type 2 diabetes mellitus (HCC) 10/04/2019   Reactive airway disease 06/20/2018   History of CVA (cerebrovascular accident) 07/23/2016   Trochanteric bursitis of right hip 07/04/2015   Calcific tendinitis of right shoulder 02/26/2012   TOBACCO ABUSE 06/12/2007   Depression 06/12/2007   Essential hypertension 06/12/2007    Past Surgical History:  Procedure Laterality Date   ABDOMINAL AORTOGRAM W/LOWER EXTREMITY N/A 09/14/2019   Procedure: ABDOMINAL AORTOGRAM W/LOWER EXTREMITY;  Surgeon: Harvey Carlin BRAVO, MD;  Location: [facility];  Service: Cardiovascular;  Laterality: N/A;   ABDOMINAL HYSTERECTOMY  1980s   unsure if ovaries were taken   BYPASS GRAFT Left 10/22/2019    Left femoral to above-knee popliteal bypass using reversed ipsilateral greater saphenous vein   colonoscopy     FEMORAL-POPLITEAL BYPASS GRAFT Left 10/22/2019   Procedure: LEFT FEMORAL-ABOVE KNEE POPLITEAL  BYPASS GRAFT with REVERSED GREATER SAPHENOUS VEIN;  Surgeon: Harvey Carlin BRAVO, MD;  Location: [facility];  Service: Vascular;  Laterality: Left;   GLAUCOMA SURGERY Right 2017   IR URETERAL STENT RIGHT NEW ACCESS W/O SEP NEPHROSTOMY CATH  10/03/2018    LEFT HEART CATHETERIZATION WITH CORONARY ANGIOGRAM N/A 10/09/2014   Procedure: LEFT HEART CATHETERIZATION WITH CORONARY ANGIOGRAM;  Surgeon: Debby DELENA Sor, MD;  Location: [facility] CATH LAB;  Service: Cardiovascular;  Laterality: N/A;   NEPHROLITHOTOMY Right 10/03/2018   Procedure: RIGHT NEPHROLITHOTOMY PERCUTANEOUS FIRST STAGE;  Surgeon: Watt Rush, MD;  Location: [facility] ORS;  Service: Urology;  Laterality: Right;   PERIPHERAL VASCULAR INTERVENTION Left 09/14/2019   Procedure: PERIPHERAL VASCULAR INTERVENTION;  Surgeon: Harvey Carlin BRAVO, MD;  Location: [facility] INVASIVE CV LAB;  Service: Cardiovascular;  Laterality: Left;   left external iliac stent    TEE WITHOUT CARDIOVERSION N/A 08/17/2016   Procedure: TRANSESOPHAGEAL ECHOCARDIOGRAM (TEE);  Surgeon: Wilbert JONELLE Bihari, MD;  Location: [facility] ENDOSCOPY;  Service: Cardiovascular;  Laterality: N/A;    OB History   No obstetric history on file.      Home Medications    Prior to Admission medications   Medication Sig Start Date End Date Taking? Authorizing Provider  amLODipine  (NORVASC ) 10 MG tablet Take 1 tablet (10 mg total) by mouth daily. 04/24/24  Yes Addie Perkins, DO  aspirin  EC 81 MG tablet Take 1 tablet (81 mg total) by mouth daily. 09/29/20  Yes Aslam, Sadia, MD  baclofen (LIORESAL) 10 MG tablet Take 10 mg by mouth 3 (three) times daily as needed. 09/27/23  Yes [provider]  Blood Glucose Monitoring Suppl (CONTOUR PLUS BLUE) w/Device KIT 1 each by Does not apply route 3 (three) times daily. 07/02/24 07/02/25 Yes Lovie Clarity, MD  Blood Glucose Monitoring Suppl [facility] VERIO) w/Device KIT Use as directed 11/28/23  Yes Lovie Clarity, MD  celecoxib  (CELEBREX ) 100 MG capsule Take 100 mg by mouth 2 (two) times daily. 06/12/24  Yes [provider]  diclofenac  Sodium (VOLTAREN ) 1 % GEL Apply 4 g topically 3 (three) times daily as needed (pain). 10/14/24  Yes Shawn Sick, MD  Dulaglutide  (TRULICITY ) 4.5 MG/0.5ML SOAJ Inject 4.5 mg as directed once a  week. 02/21/24 02/20/25 Yes Lovie Clarity, MD  glucose blood [facility] VERIO) test strip Use as instructed 11/11/23  Yes Lovie Clarity, MD  hydrOXYzine  (ATARAX ) 25 MG tablet Take 25 mg by mouth 3 (three) times daily as needed. 07/27/23  Yes [provider]  insulin  glargine (LANTUS  SOLOSTAR) 100 UNIT/ML Solostar Pen Inject 44 Units into the skin at bedtime. 10/19/24  Yes Shawn Sick, MD  insulin  lispro (HUMALOG  KWIKPEN) 100 UNIT/ML KwikPen Inject 6 Units into the skin 2 (two) times daily with a meal. 04/24/24 02/18/25 Yes Addie Perkins, DO  Insulin  Pen Needle 32G X 4 MM MISC Use to inject insulin  4 times a day 10/05/23  Yes Lovie Clarity, MD  irbesartan  (AVAPRO ) 300 MG tablet Take 1 tablet (300  mg total) by mouth daily. 06/26/24  Yes Lovie Clarity, MD  lamoTRIgine (LAMICTAL) 25 MG tablet Take 50 mg by mouth daily. 06/13/24  Yes [provider]  lidocaine  (LIDODERM ) 5 % 1 Patch(s) Topical 08/16/22  Yes [provider]  LINZESS 290 MCG CAPS capsule Take 290 mcg by mouth daily as needed. 10/06/21  Yes [provider]  metFORMIN  (GLUCOPHAGE  XR) 500 MG 24 hr tablet Take 1 tablet (500 mg total) by mouth 2 (two) times daily with a meal. 12/16/23 12/15/24 Yes Lovie Clarity, MD  mirtazapine  (REMERON ) 15 MG tablet Take 1 tablet (15 mg total) by mouth at bedtime. 09/03/24 12/02/24 Yes Shawn Sick, MD  OLANZapine  (ZYPREXA ) 5 MG tablet Take 1 tablet (5 mg total) by mouth at bedtime. 05/04/23  Yes Lovie Clarity, MD  OneTouch Delica Lancets 33G MISC Use with one touch Delica lancing device and Accu chek Guideme meter and Accu Chek guide strips to check your blood sugar up to 3 times a day 05/04/23  Yes Lovie Clarity, MD  oxyCODONE  (ROXICODONE ) 15 MG immediate release tablet Take 15 mg by mouth every other day as needed for pain.  09/10/20  Yes [provider]  pantoprazole  (PROTONIX ) 40 MG tablet Take 40 mg by mouth daily. 09/16/23  Yes [provider]  prednisoLONE  acetate (PRED  FORTE) 1 % ophthalmic suspension Apply to eye. 09/29/20  Yes [provider]  ramelteon (ROZEREM) 8 MG tablet Take 8 mg by mouth at bedtime. 09/09/22  Yes [provider]  rosuvastatin  (CRESTOR ) 20 MG tablet Take 1 tablet (20 mg total) by mouth at bedtime. 08/20/24  Yes Court Dorn PARAS, MD  traZODone  (DESYREL ) 100 MG tablet Take 300 mg by mouth at bedtime.   Yes [provider]  venlafaxine XR (EFFEXOR-XR) 75 MG 24 hr capsule Take 75 mg by mouth every morning. 10/06/21  Yes [provider]  Vitamin D , Ergocalciferol , (DRISDOL ) 1.25 MG (50000 UNIT) CAPS capsule Take 50,000 Units by mouth once a week. 06/12/24  Yes [provider]  buprenorphine (BUTRANS) 10 MCG/HR PTWK 1 patch once a week. 09/16/22   [provider]  glucose blood test strip Use as instructed 07/02/24   Machen, Julie, MD  NARCAN 4 MG/0.1ML LIQD nasal spray kit Place 1 spray into the nose once. 04/29/20   [provider]    Family History Family History  Problem Relation Age of Onset   Heart disease Mother 36   Hypertension Mother    CVA Mother    Heart disease Father 10   CVA Sister    Heart disease Sister    Aneurysm Sister    CVA Brother    Aneurysm Brother    CVA Maternal Grandmother    CVA Maternal Grandfather    Hypertension Brother    Colon cancer Neg Hx    Esophageal cancer Neg Hx    Stomach cancer Neg Hx     Social History Social History   Tobacco Use   Smoking status: Every Day    Current packs/day: 0.50    Average packs/day: 0.5 packs/day for 44.8 years (22.4 ttl pk-yrs)    Types: Cigarettes    Start date: 1981   Smokeless tobacco: Current    Types: Chew   Tobacco comments:    6 cigs per day  Vaping Use   Vaping status: Never Used  Substance Use Topics   Alcohol use: No    Alcohol/week: 0.0 standard drinks of alcohol   Drug use: No  Allergies   Ace inhibitors, Lipitor  [atorvastatin ], and Chantix  [varenicline ]   Review of  Systems Review of Systems   Physical Exam Triage Vital Signs ED Triage Vitals  Encounter Vitals Group     BP 10/22/24 1024 (!) 148/88     Girls Systolic BP Percentile --      Girls Diastolic BP Percentile --      Boys Systolic BP Percentile --      Boys Diastolic BP Percentile --      Pulse Rate 10/22/24 1024 91     Resp 10/22/24 1024 20     Temp 10/22/24 1024 98.1 F (36.7 C)     Temp src --      SpO2 10/22/24 1024 96 %     Weight --      Height --      Head Circumference --      Peak Flow --      Pain Score 10/22/24 1019 0     Pain Loc --      Pain Education --      Exclude from Growth Chart --    No data found.  Updated Vital Signs BP (!) 148/88   Pulse 91   Temp 98.1 F (36.7 C)   Resp 20   SpO2 96%   Visual Acuity Right Eye Distance:   Left Eye Distance:   Bilateral Distance:    Right Eye Near:   Left Eye Near:    Bilateral Near:     Physical Exam Vitals and nursing note reviewed.  Constitutional:      General: She is not in acute distress.    Appearance: Normal appearance. She is not ill-appearing, toxic-appearing or diaphoretic.  HENT:     Right Ear: Tympanic membrane, ear canal and external ear normal.     Left Ear: There is impacted cerumen.  Eyes:     General: No scleral icterus. Cardiovascular:     Rate and Rhythm: Normal rate and regular rhythm.     Heart sounds: Normal heart sounds.  Pulmonary:     Effort: Pulmonary effort is normal. No respiratory distress.     Breath sounds: Normal breath sounds. No wheezing or rhonchi.  Skin:    General: Skin is warm.  Neurological:     Mental Status: She is alert and oriented to person, place, and time.  Psychiatric:        Mood and Affect: Mood normal.        Behavior: Behavior normal.      UC Treatments / Results  Labs (all labs ordered are listed, but only abnormal results are displayed) Labs Reviewed - No data to display  EKG   Radiology No results found.  Procedures Procedures  (including critical care time)  Medications Ordered in UC Medications - No data to display  Initial Impression / Assessment and Plan / UC Course  I have reviewed the triage vital signs and the nursing notes.  Pertinent labs & imaging results that were available during my care of the patient were reviewed by me and considered in my medical decision making (see chart for details).     Pt was given ear lavage in office which restored her hearing. Pt advised not to use qtips.  Final Clinical Impressions(s) / UC Diagnoses   Final diagnoses:  None   Discharge Instructions   None    ED Prescriptions   None    PDMP not reviewed this encounter.   Andra Corean BROCKS, PA-C  10/22/24 1058  

## 2024-10-31 ENCOUNTER — Other Ambulatory Visit: Payer: Self-pay

## 2024-10-31 ENCOUNTER — Ambulatory Visit (INDEPENDENT_AMBULATORY_CARE_PROVIDER_SITE_OTHER)

## 2024-10-31 VITALS — BP 159/95 | HR 84 | Temp 98.2°F | Ht 67.0 in | Wt 168.6 lb

## 2024-10-31 DIAGNOSIS — Z23 Encounter for immunization: Secondary | ICD-10-CM

## 2024-10-31 DIAGNOSIS — I2510 Atherosclerotic heart disease of native coronary artery without angina pectoris: Secondary | ICD-10-CM

## 2024-10-31 DIAGNOSIS — Z7985 Long-term (current) use of injectable non-insulin antidiabetic drugs: Secondary | ICD-10-CM | POA: Diagnosis not present

## 2024-10-31 DIAGNOSIS — Z794 Long term (current) use of insulin: Secondary | ICD-10-CM

## 2024-10-31 DIAGNOSIS — I10 Essential (primary) hypertension: Secondary | ICD-10-CM

## 2024-10-31 DIAGNOSIS — E1165 Type 2 diabetes mellitus with hyperglycemia: Secondary | ICD-10-CM | POA: Diagnosis not present

## 2024-10-31 DIAGNOSIS — Z8249 Family history of ischemic heart disease and other diseases of the circulatory system: Secondary | ICD-10-CM

## 2024-10-31 DIAGNOSIS — E119 Type 2 diabetes mellitus without complications: Secondary | ICD-10-CM

## 2024-10-31 DIAGNOSIS — F17210 Nicotine dependence, cigarettes, uncomplicated: Secondary | ICD-10-CM

## 2024-10-31 DIAGNOSIS — F17200 Nicotine dependence, unspecified, uncomplicated: Secondary | ICD-10-CM

## 2024-10-31 DIAGNOSIS — Z79899 Other long term (current) drug therapy: Secondary | ICD-10-CM

## 2024-10-31 DIAGNOSIS — Z8673 Personal history of transient ischemic attack (TIA), and cerebral infarction without residual deficits: Secondary | ICD-10-CM

## 2024-10-31 DIAGNOSIS — Z7982 Long term (current) use of aspirin: Secondary | ICD-10-CM

## 2024-10-31 LAB — POCT GLYCOSYLATED HEMOGLOBIN (HGB A1C): HbA1c, POC (controlled diabetic range): 6.1 % (ref 0.0–7.0)

## 2024-10-31 LAB — GLUCOSE, CAPILLARY: Glucose-Capillary: 134 mg/dL — ABNORMAL HIGH (ref 70–99)

## 2024-10-31 MED ORDER — LANTUS SOLOSTAR 100 UNIT/ML ~~LOC~~ SOPN: 44.0000 [IU] | PEN_INJECTOR | Freq: Every day | SUBCUTANEOUS | 3 refills | Status: AC

## 2024-10-31 MED ORDER — TRULICITY 4.5 MG/0.5ML ~~LOC~~ SOAJ: 4.5000 mg | SUBCUTANEOUS | 3 refills | Status: AC

## 2024-10-31 MED ORDER — AMLODIPINE BESYLATE 10 MG PO TABS: 10.0000 mg | ORAL_TABLET | Freq: Every day | ORAL | 3 refills | Status: AC

## 2024-10-31 MED ORDER — IRBESARTAN 300 MG PO TABS: 300.0000 mg | ORAL_TABLET | Freq: Every day | ORAL | 1 refills | Status: AC

## 2024-10-31 MED ORDER — PANTOPRAZOLE SODIUM 40 MG PO TBEC: 40.0000 mg | DELAYED_RELEASE_TABLET | Freq: Every day | ORAL | 0 refills | Status: AC

## 2024-10-31 MED ORDER — ZOSTER VAC RECOMB ADJUVANTED 50 MCG/0.5ML IM SUSR
0.5000 mL | Freq: Once | INTRAMUSCULAR | 0 refills | Status: AC
  Filled 2024-10-31: qty 0.5, 1d supply, fill #0

## 2024-10-31 MED ORDER — ROSUVASTATIN CALCIUM 20 MG PO TABS: 20.0000 mg | ORAL_TABLET | Freq: Every day | ORAL | 3 refills | Status: AC

## 2024-10-31 MED ORDER — MIRTAZAPINE 15 MG PO TABS: 15.0000 mg | ORAL_TABLET | Freq: Every day | ORAL | 0 refills | Status: AC

## 2024-10-31 MED ORDER — INSULIN LISPRO (1 UNIT DIAL) 100 UNIT/ML (KWIKPEN): 6.0000 [IU] | PEN_INJECTOR | Freq: Two times a day (BID) | SUBCUTANEOUS | 1 refills | Status: AC

## 2024-10-31 MED ORDER — ASPIRIN EC 81 MG PO TBEC: 81.0000 mg | DELAYED_RELEASE_TABLET | Freq: Every day | ORAL | 1 refills | Status: AC

## 2024-10-31 MED ORDER — METFORMIN HCL ER 500 MG PO TB24: 500.0000 mg | ORAL_TABLET | Freq: Two times a day (BID) | ORAL | 3 refills | Status: AC

## 2024-10-31 MED ORDER — BUPRENORPHINE 10 MCG/HR TD PTWK: 1.0000 | MEDICATED_PATCH | TRANSDERMAL | 1 refills | Status: AC

## 2024-10-31 NOTE — Assessment & Plan Note (Signed)
 Cont aspirin , statin - check lipid panel

## 2024-10-31 NOTE — Assessment & Plan Note (Signed)
 Counseled on tobacco cessation. Pt declines.   Last LDCT 08/2024:  Lung-RADS 1, negative. Continue annual screening with low-dose chest CT without contrast in 12 months.  Three-vessel coronary atherosclerosis.  Aortic Atherosclerosis (ICD10-I70.0) and Emphysema (ICD10-J43.9).

## 2024-10-31 NOTE — Assessment & Plan Note (Signed)
 BP elevated in office today (144/94), currently on irbesartan  300 mg, amlodipine  10 mg. Her BP has been stable on this regimen for some time now and was at goal during her last visit with Dr. Lovie 05/2024 and w/ her Cardiology NP 06/2024 but appears to have been more elevated in recent months (was elevated during her Vascular Surgery office visit 158/93 in 07/2024). She does note recent worsening in her diet which may be contributing. Recommend BP log and if still elevated at next visit, will add on additional agent. - check BMP - continue irbesartan  300 mg, amlodipine  10 mg

## 2024-10-31 NOTE — Progress Notes (Signed)
 Subjective:   Patient ID: Susan Holmes female   DOB: 05-13-1956 68 y.o.   MRN: [id number]  HPI: Susan Holmes is a 68 y.o. F PMH HTN, CAD, T2DM on insulin , tobacco use disorder, MDD, hx of CVA who presents for follow up.  T2DM - A1c 6.1 today and stable from prior. Evaluated by BG log, she checks blood sugars at around 8pm nightly and she has been in range ~50% of the time and otherwise hyperglycemic. She notes that her diet has been worse recently, she eats 1-2 large meals during the day and snacks (sweet rolls, grapes, etc.) in the evening. Currently on Trulicity  4.5 mg, lost 4# since 03/2024. On metformin  500 mg BID. Taking Lantus  44U nightly with Humalog  6U 1-2x every day with meals, sometimes with correctional dosing afterwards (usually ~10U) if she forgets to take it prior to meals and finds herself to be hyperglycemic. No hypoglycemic episodes (<70), although does note episodes of BG in the 70's ~1-2x month, sometimes associated w/ diaphoresis. After these episodes, she will drink juice and this will help.   Tobacco use disorder - 5-6 cigarettes a day, not interested in quitting.  HTN - on irbesartan , amlodipine . Needs refills.   Past Medical History:  Diagnosis Date   Anxiety    Arthritis    Callus of foot 09/19/2018   Chest pain 09/26/2018   Claudication 09/14/2019   Congenital blindness    Left  eye   CONGENITAL BLINDNESS, LEFT EYE 06/12/2007   Qualifier: Diagnosis of   By: Drinkard MSN, FNP-C, Sue         Coronary artery disease    per cardiac cath 2015-- mild disease involving LAD and branches ,  LCFx and branches, and mild disease pRCA   Cough 07/08/2016   Depression    Diabetes mellitus, type II (HCC)    followed by pcp   Erosive gastropathy 12/31/2020   She also had an upper GI endoscopy on 09/28/2013 which revealed a normal esophagus without masses or strictures, esophagitis, eosinophilic esophagitis. There was a few dispersed small erosions found in the  prepyloric region of the stomach.      Glaucoma    Glaucoma, both eyes    Hepatitis B infection pt unsure   resolved   History of colonic polyps 05/17/2022   IMO SNOMED Dx Update Oct 2024     History of kidney stones    right stone   History of transient ischemic attack (TIA) 2017   per pt no residuals   History of vitamin D  deficiency 09/06/2017   HTN (hypertension)    followed by pcp   Hyperlipidemia    Hyperparathyroidism 03/08/2013   Appears to be diagnosed around 02/2013:  PTH 109.9  Vit d 25-OH 02/2013: 19  Dexa 02/2013: Patient's diagnostic category is NORMAL by WHO Criteria. FRACTURE RISK: NOT INCREASED FRAX: Not calculated due to a T score at or above -1.0.    Serum calcium  initially 11.8  Ionized calcium  elevated 1.45        Liver hemangioma 04/04/2014   Lumbar spondylosis 06/05/2021   Osteoarthritis 12/11/2014   PAD (peripheral artery disease)    Paronychia of great toe, left 06/21/2019   Poor dental hygiene    Stroke (HCC)    Tobacco abuse    .5 PPD smoker   Uncontrolled type II diabetes mellitus 06/12/2007   Qualifier: Diagnosis of  By: Drinkard MSN, FNP-C, Beverley     Current Outpatient Medications  Medication Sig Dispense Refill  amLODipine  (NORVASC ) 10 MG tablet Take 1 tablet (10 mg total) by mouth daily. 90 tablet 3   aspirin  EC 81 MG tablet Take 1 tablet (81 mg total) by mouth daily. 90 tablet 1   baclofen (LIORESAL) 10 MG tablet Take 10 mg by mouth 3 (three) times daily as needed.     Blood Glucose Monitoring Suppl (CONTOUR PLUS BLUE) w/Device KIT 1 each by Does not apply route 3 (three) times daily. 1 kit 3   Blood Glucose Monitoring Suppl (ONETOUCH VERIO) w/Device KIT Use as directed 1 kit 0   buprenorphine (BUTRANS) 10 MCG/HR PTWK 1 patch once a week.     celecoxib  (CELEBREX ) 100 MG capsule Take 100 mg by mouth 2 (two) times daily.     diclofenac  Sodium (VOLTAREN ) 1 % GEL Apply 4 g topically 3 (three) times daily as needed (pain). 150 g 2   Dulaglutide   (TRULICITY ) 4.5 MG/0.5ML SOAJ Inject 4.5 mg as directed once a week. 6 mL 3   glucose blood (ONETOUCH VERIO) test strip Use as instructed 100 each 12   glucose blood test strip Use as instructed 100 each 12   hydrOXYzine  (ATARAX ) 25 MG tablet Take 25 mg by mouth 3 (three) times daily as needed.     insulin  glargine (LANTUS  SOLOSTAR) 100 UNIT/ML Solostar Pen Inject 44 Units into the skin at bedtime. 15 mL 3   insulin  lispro (HUMALOG  KWIKPEN) 100 UNIT/ML KwikPen Inject 6 Units into the skin 2 (two) times daily with a meal. 15 mL 1   Insulin  Pen Needle 32G X 4 MM MISC Use to inject insulin  4 times a day 360 each 3   irbesartan  (AVAPRO ) 300 MG tablet Take 1 tablet (300 mg total) by mouth daily. 90 tablet 1   lamoTRIgine (LAMICTAL) 25 MG tablet Take 50 mg by mouth daily.     lidocaine  (LIDODERM ) 5 % 1 Patch(s) Topical     LINZESS 290 MCG CAPS capsule Take 290 mcg by mouth daily as needed.     metFORMIN  (GLUCOPHAGE  XR) 500 MG 24 hr tablet Take 1 tablet (500 mg total) by mouth 2 (two) times daily with a meal. 180 tablet 3   mirtazapine  (REMERON ) 15 MG tablet Take 1 tablet (15 mg total) by mouth at bedtime. 90 tablet 0   NARCAN 4 MG/0.1ML LIQD nasal spray kit Place 1 spray into the nose once.     OLANZapine  (ZYPREXA ) 5 MG tablet Take 1 tablet (5 mg total) by mouth at bedtime.     OneTouch Delica Lancets 33G MISC Use with one touch Delica lancing device and Accu chek Guideme meter and Accu Chek guide strips to check your blood sugar up to 3 times a day 100 each 11   oxyCODONE  (ROXICODONE ) 15 MG immediate release tablet Take 15 mg by mouth every other day as needed for pain.      pantoprazole  (PROTONIX ) 40 MG tablet Take 40 mg by mouth daily.     prednisoLONE  acetate (PRED FORTE ) 1 % ophthalmic suspension Apply to eye.     ramelteon (ROZEREM) 8 MG tablet Take 8 mg by mouth at bedtime.     rosuvastatin  (CRESTOR ) 20 MG tablet Take 1 tablet (20 mg total) by mouth at bedtime. 90 tablet 3   traZODone  (DESYREL )  100 MG tablet Take 300 mg by mouth at bedtime.     venlafaxine XR (EFFEXOR-XR) 75 MG 24 hr capsule Take 75 mg by mouth every morning.     Vitamin D , Ergocalciferol , (DRISDOL )  1.25 MG (50000 UNIT) CAPS capsule Take 50,000 Units by mouth once a week.     No current facility-administered medications for this visit.   Family History  Problem Relation Age of Onset   Heart disease Mother 48   Hypertension Mother    CVA Mother    Heart disease Father 55   CVA Sister    Heart disease Sister    Aneurysm Sister    CVA Brother    Aneurysm Brother    CVA Maternal Grandmother    CVA Maternal Grandfather    Hypertension Brother    Colon cancer Neg Hx    Esophageal cancer Neg Hx    Stomach cancer Neg Hx    Social History   Socioeconomic History   Marital status: Divorced    Spouse name: Not on file   Number of children: 1   Years of education: 12   Highest education level: Not on file  Occupational History   Occupation: Disabled  Tobacco Use   Smoking status: Every Day    Current packs/day: 0.50    Average packs/day: 0.5 packs/day for 44.8 years (22.4 ttl pk-yrs)    Types: Cigarettes    Start date: 1981   Smokeless tobacco: Current    Types: Chew   Tobacco comments:    6 cigs per day  Vaping Use   Vaping status: Never Used  Substance and Sexual Activity   Alcohol use: No    Alcohol/week: 0.0 standard drinks of alcohol   Drug use: No   Sexual activity: Not Currently    Birth control/protection: Surgical  Other Topics Concern   Not on file  Social History Narrative   Current Social History 08/18/2020        Patient lives alone in a ground floor apartment which is 1 story. There are 13 steps with handrail up to the entrance the patient uses.       Patient's method of transportation is [facility] transportation.      The highest level of education was 2 years college.      The patient currently disabled.      Identified important Relationships are My daughter, brothers,  grands and great-grands.       Pets : chihuahua Catering Manager)       Interests / Fun: I don't       Current Stressors: Nothing       Religious / Personal Beliefs: '[employer]       Other: I am a very loving person      L. Ducatte, BSN, RN-BC        Social Drivers of Health   Financial Resource Strain: Low Risk  (03/21/2024)   Overall Financial Resource Strain (CARDIA)    Difficulty of Paying Living Expenses: Not hard at all  Food Insecurity: No Food Insecurity (03/21/2024)   Hunger Vital Sign    Worried About Running Out of Food in the Last Year: Never true    Ran Out of Food in the Last Year: Never true  Transportation Needs: No Transportation Needs (03/21/2024)   PRAPARE - Administrator, Civil Service (Medical): No    Lack of Transportation (Non-Medical): No  Physical Activity: Inactive (03/21/2024)   Exercise Vital Sign    Days of Exercise per Week: 0 days    Minutes of Exercise per Session: 0 min  Stress: No Stress Concern Present (03/21/2024)   [employer] of Occupational Health - Occupational Stress Questionnaire    Feeling  of Stress : Not at all  Social Connections: Moderately Integrated (03/21/2024)   Social Connection and Isolation Panel    Frequency of Communication with Friends and Family: More than three times a week    Frequency of Social Gatherings with Friends and Family: Once a week    Attends Religious Services: More than 4 times per year    Active Member of [employer] or Organizations: No    Attends Engineer, Structural: More than 4 times per year    Marital Status: Divorced   Review of Systems: Pertinent items are noted in HPI. Objective:   Vitals:   10/31/24 0905  Pulse: 84  Temp: 98.2 F (36.8 C)  TempSrc: Oral  SpO2: 97%  Weight: 168 lb 9.6 oz (76.5 kg)  Height: 5' 7 (1.702 m)    Physical Exam: GEN: Well appearing, NAD. Oriented x 3, normal mood and affect  HEENT: Normocephalic, atraumatic, Conjunctiva clear, sclera  non-icteric, EOM intact CV: RRR, no M/R/G PULM: CTAB MSK: b/l LE wnl NEURO: moving all extremities spontaneously in upper and lower extremities. PSYCH: Oriented X3, intact recent and remote memory, judgment and insight, normal mood and affect.   Assessment & Plan:   Assessment & Plan Insulin  dependent type 2 diabetes mellitus (HCC) A1c stable at 6.1 today, currently on Trulicity  4.5 mg, MFM 500 mg BID. Lantus  44U + Humalog  6U 1-2x daily. Of note, she has not lost any weight on Trulicity  over the past 6 months while being on max dose and does not note any significant weight loss earlier in the course of her GLP1 initiation either. She has great glucose control with the GLP-1 but I do think some degree of weight loss would be beneficial to her from a cardiovascular and HTN standpoint as well. Will maintain on current meds, will consider switching to Zepbound or Mounjaro for greater weight loss effect at next visit.  Need for shingles vaccine Shingrix ordered to pharmacy Coronary artery disease involving native coronary artery of native heart without angina pectoris Cont aspirin , statin Essential hypertension BP elevated in office today (144/94), currently on irbesartan  300 mg, amlodipine  10 mg. Her BP has been stable on this regimen for some time now and was at goal during her last visit with Dr. Lovie 05/2024 and w/ her Cardiology NP 06/2024 but appears to have been more elevated in recent months (was elevated during her Vascular Surgery office visit 158/93 in 07/2024). She does note recent worsening in her diet which may be contributing. Recommend BP log and if still elevated at next visit, will add on additional agent. - check BMP - continue irbesartan  300 mg, amlodipine  10 mg TOBACCO ABUSE Counseled on tobacco cessation. Pt declines.   Last LDCT 08/2024:  Lung-RADS 1, negative. Continue annual screening with low-dose chest CT without contrast in 12 months.  Three-vessel coronary  atherosclerosis.  Aortic Atherosclerosis (ICD10-I70.0) and Emphysema (ICD10-J43.9).  History of CVA (cerebrovascular accident) Cont aspirin , statin - check lipid panel  Jone Dauphin MD

## 2024-10-31 NOTE — Patient Instructions (Signed)
 Thank you for coming in today. If you have any questions or concerns, please feel free to contact me via MyChart or call the office.   If you have gotten labs today, I will be in touch via MyChart or telephone.   Have a great day.

## 2024-10-31 NOTE — Assessment & Plan Note (Signed)
 Cont aspirin, statin

## 2024-11-01 ENCOUNTER — Ambulatory Visit: Payer: Self-pay

## 2024-11-01 LAB — BASIC METABOLIC PANEL WITH GFR
BUN/Creatinine Ratio: 9 — ABNORMAL LOW (ref 12–28)
BUN: 7 mg/dL — ABNORMAL LOW (ref 8–27)
CO2: 21 mmol/L (ref 20–29)
Calcium: 9.9 mg/dL (ref 8.7–10.3)
Chloride: 107 mmol/L — ABNORMAL HIGH (ref 96–106)
Creatinine, Ser: 0.82 mg/dL (ref 0.57–1.00)
Glucose: 143 mg/dL — ABNORMAL HIGH (ref 70–99)
Potassium: 4.8 mmol/L (ref 3.5–5.2)
Sodium: 143 mmol/L (ref 134–144)
eGFR: 77 mL/min/1.73 (ref >59)

## 2024-11-01 LAB — LIPID PANEL
Chol/HDL Ratio: 2.1 ratio (ref 0.0–4.4)
Cholesterol, Total: 128 mg/dL (ref 100–199)
HDL: 61 mg/dL (ref >39)
LDL Chol Calc (NIH): 53 mg/dL (ref 0–99)
Triglycerides: 70 mg/dL (ref 0–149)
VLDL Cholesterol Cal: 14 mg/dL (ref 5–40)

## 2024-11-08 ENCOUNTER — Other Ambulatory Visit: Payer: Self-pay

## 2024-11-09 ENCOUNTER — Other Ambulatory Visit: Payer: Self-pay

## 2024-11-16 ENCOUNTER — Encounter: Payer: Self-pay | Admitting: Pharmacist

## 2024-11-16 NOTE — Progress Notes (Signed)
 This patient is appearing on a report for being at risk of failing the adherence measure for cholesterol (statin) medications this calendar year.   Medication: rosuvastatin  20 mg Last fill date: 10/31/2024 for 90 day supply  Completed chart review.

## 2025-01-09 NOTE — Progress Notes (Signed)
 Susan Holmes                                          MRN: [id number]   01/09/2025   The [facility] Quality Team Specialist reviewed this patient medical record for the purposes of chart review for care gap closure. The following were reviewed: abstraction for care gap closure-glycemic status assessment.    [facility] Quality Team

## 2025-01-14 ENCOUNTER — Ambulatory Visit: Admitting: Podiatry

## 2025-01-14 ENCOUNTER — Encounter: Payer: Self-pay | Admitting: Podiatry

## 2025-01-14 DIAGNOSIS — L84 Corns and callosities: Secondary | ICD-10-CM

## 2025-01-14 DIAGNOSIS — E1151 Type 2 diabetes mellitus with diabetic peripheral angiopathy without gangrene: Secondary | ICD-10-CM | POA: Diagnosis not present

## 2025-01-14 DIAGNOSIS — B351 Tinea unguium: Secondary | ICD-10-CM | POA: Diagnosis not present

## 2025-01-14 DIAGNOSIS — M79675 Pain in left toe(s): Secondary | ICD-10-CM

## 2025-01-14 DIAGNOSIS — M79674 Pain in right toe(s): Secondary | ICD-10-CM | POA: Diagnosis not present

## 2025-01-14 NOTE — Progress Notes (Signed)
 This patient returns to my office for at risk foot care.  This patient requires this care by a professional since this patient will be at risk due to having diabetes.  This patient is unable to cut nails himself since the patient cannot reach his nails.These nails are painful walking and wearing shoes.  This patient presents for at risk foot care today.  General Appearance  Alert, conversant and in no acute stress.  Vascular  Dorsalis pedis and posterior tibial  pulses are palpable  bilaterally.  Capillary return is within normal limits  bilaterally. Temperature is within normal limits  bilaterally.  Neurologic  Senn-Weinstein monofilament wire test within normal limits  bilaterally. Muscle power within normal limits bilaterally.  Nails Thick disfigured discolored nails with subungual debris  hallux nails  bilaterally. No evidence of bacterial infection or drainage bilaterally.  Orthopedic  No limitations of motion  feet .  No crepitus or effusions noted.  No bony pathology or digital deformities noted.  Skin  normotropic skin noted bilaterally.  No signs of infections or ulcers noted.   Callus sub 2 right foot.  Onychomycosis  Pain in right toes  Pain in left toes   Porokeratosis right forefoot.  Consent was obtained for treatment procedures.   Mechanical debridement of nails 1-5  bilaterally performed with a nail nipper.  Filed with dremel without incident. Debride porokeratosis with # 15 blade and dremel tool.   Return office visit    3 months                  Told patient to return for periodic foot care and evaluation due to potential at risk complications.   Cordella Bold DPM  / tbn

## 2025-01-22 ENCOUNTER — Encounter: Payer: Self-pay | Admitting: *Deleted

## 2025-01-22 NOTE — Progress Notes (Unsigned)
 SUSI GOSLIN                                          MRN: [id number]   01/22/2025   The [facility] Quality Team Specialist reviewed this patient medical record for the purposes of chart review for care gap closure. The following were reviewed: {CHL AMB [facility] QUALITY SPECIALIST REASON FOR REVIEW:[id number]}.    [facility] Quality Team

## 2025-03-27 ENCOUNTER — Ambulatory Visit

## 2025-04-15 ENCOUNTER — Ambulatory Visit: Admitting: Podiatry
